# Patient Record
Sex: Female | Born: 1940
Health system: Southern US, Community
[De-identification: ages and names within clinical notes are randomized; demographics above are authoritative.]

## PROBLEM LIST (undated history)

## (undated) DIAGNOSIS — Z9889 Other specified postprocedural states: Secondary | ICD-10-CM

## (undated) DIAGNOSIS — K219 Gastro-esophageal reflux disease without esophagitis: Secondary | ICD-10-CM

## (undated) DIAGNOSIS — R011 Cardiac murmur, unspecified: Secondary | ICD-10-CM

## (undated) DIAGNOSIS — F329 Major depressive disorder, single episode, unspecified: Secondary | ICD-10-CM

## (undated) DIAGNOSIS — I639 Cerebral infarction, unspecified: Secondary | ICD-10-CM

## (undated) DIAGNOSIS — I7090 Unspecified atherosclerosis: Secondary | ICD-10-CM

## (undated) DIAGNOSIS — F32A Depression, unspecified: Secondary | ICD-10-CM

## (undated) DIAGNOSIS — R112 Nausea with vomiting, unspecified: Secondary | ICD-10-CM

## (undated) DIAGNOSIS — K5790 Diverticulosis of intestine, part unspecified, without perforation or abscess without bleeding: Secondary | ICD-10-CM

## (undated) DIAGNOSIS — E785 Hyperlipidemia, unspecified: Secondary | ICD-10-CM

## (undated) DIAGNOSIS — M1990 Unspecified osteoarthritis, unspecified site: Secondary | ICD-10-CM

## (undated) DIAGNOSIS — C801 Malignant (primary) neoplasm, unspecified: Secondary | ICD-10-CM

## (undated) DIAGNOSIS — M549 Dorsalgia, unspecified: Secondary | ICD-10-CM

## (undated) DIAGNOSIS — R42 Dizziness and giddiness: Secondary | ICD-10-CM

## (undated) HISTORY — PX: BREAST BIOPSY: SHX20

## (undated) HISTORY — DX: Hyperlipidemia, unspecified: E78.5

## (undated) HISTORY — PX: BREAST EXCISIONAL BIOPSY: SUR124

## (undated) HISTORY — DX: Unspecified atherosclerosis: I70.90

## (undated) HISTORY — PX: TONSILLECTOMY: SUR1361

---

## 1992-12-06 HISTORY — PX: FACELIFT: SHX1566

## 2005-01-06 ENCOUNTER — Ambulatory Visit: Payer: Self-pay | Admitting: Internal Medicine

## 2006-01-10 ENCOUNTER — Ambulatory Visit: Payer: Self-pay | Admitting: Internal Medicine

## 2007-01-17 ENCOUNTER — Ambulatory Visit: Payer: Self-pay | Admitting: Internal Medicine

## 2007-01-20 ENCOUNTER — Ambulatory Visit: Payer: Self-pay | Admitting: Internal Medicine

## 2007-02-14 ENCOUNTER — Ambulatory Visit: Payer: Self-pay | Admitting: General Surgery

## 2007-09-08 ENCOUNTER — Ambulatory Visit: Payer: Self-pay | Admitting: Internal Medicine

## 2007-12-07 HISTORY — PX: CERVICAL DISC SURGERY: SHX588

## 2008-04-18 ENCOUNTER — Ambulatory Visit: Payer: Self-pay | Admitting: Internal Medicine

## 2008-04-25 ENCOUNTER — Ambulatory Visit: Payer: Self-pay | Admitting: Physician Assistant

## 2008-06-04 ENCOUNTER — Encounter (INDEPENDENT_AMBULATORY_CARE_PROVIDER_SITE_OTHER): Payer: Self-pay | Admitting: Neurology

## 2008-06-04 ENCOUNTER — Ambulatory Visit: Payer: Self-pay

## 2008-07-02 ENCOUNTER — Ambulatory Visit: Payer: Self-pay

## 2008-10-15 ENCOUNTER — Encounter: Payer: Self-pay | Admitting: Neurosurgery

## 2008-11-19 ENCOUNTER — Ambulatory Visit: Payer: Self-pay | Admitting: Internal Medicine

## 2008-11-26 ENCOUNTER — Ambulatory Visit: Payer: Self-pay | Admitting: Internal Medicine

## 2008-12-10 ENCOUNTER — Ambulatory Visit: Payer: Self-pay | Admitting: Internal Medicine

## 2009-02-14 ENCOUNTER — Ambulatory Visit: Payer: Self-pay | Admitting: General Surgery

## 2009-05-28 ENCOUNTER — Ambulatory Visit: Payer: Self-pay | Admitting: Internal Medicine

## 2009-09-09 ENCOUNTER — Ambulatory Visit: Payer: Self-pay | Admitting: Internal Medicine

## 2009-12-29 ENCOUNTER — Ambulatory Visit: Payer: Self-pay | Admitting: Internal Medicine

## 2010-11-23 ENCOUNTER — Ambulatory Visit: Payer: Self-pay | Admitting: Internal Medicine

## 2010-11-23 ENCOUNTER — Other Ambulatory Visit: Payer: Self-pay | Admitting: Physician Assistant

## 2010-12-30 ENCOUNTER — Ambulatory Visit: Payer: Self-pay | Admitting: Internal Medicine

## 2010-12-31 ENCOUNTER — Ambulatory Visit: Payer: Self-pay | Admitting: Internal Medicine

## 2011-01-11 ENCOUNTER — Ambulatory Visit: Payer: Self-pay | Admitting: Surgery

## 2011-01-14 LAB — PATHOLOGY REPORT

## 2011-05-31 ENCOUNTER — Ambulatory Visit: Payer: Self-pay

## 2011-11-01 ENCOUNTER — Ambulatory Visit: Payer: Self-pay | Admitting: Unknown Physician Specialty

## 2011-11-01 DIAGNOSIS — Z01810 Encounter for preprocedural cardiovascular examination: Secondary | ICD-10-CM

## 2011-11-02 ENCOUNTER — Ambulatory Visit: Payer: Self-pay | Admitting: Unknown Physician Specialty

## 2011-12-07 HISTORY — PX: BACK SURGERY: SHX140

## 2012-03-01 ENCOUNTER — Ambulatory Visit: Payer: Self-pay | Admitting: Internal Medicine

## 2012-03-21 ENCOUNTER — Encounter: Payer: Self-pay | Admitting: Otolaryngology

## 2012-04-05 ENCOUNTER — Encounter: Payer: Self-pay | Admitting: Otolaryngology

## 2012-11-14 ENCOUNTER — Ambulatory Visit: Payer: Self-pay | Admitting: Internal Medicine

## 2013-04-17 ENCOUNTER — Ambulatory Visit: Payer: Self-pay | Admitting: Internal Medicine

## 2013-04-18 ENCOUNTER — Ambulatory Visit: Payer: Self-pay | Admitting: Internal Medicine

## 2013-05-03 ENCOUNTER — Ambulatory Visit: Payer: Self-pay | Admitting: Surgery

## 2013-05-04 LAB — PATHOLOGY REPORT

## 2013-07-09 ENCOUNTER — Encounter: Payer: Self-pay | Admitting: *Deleted

## 2013-07-31 ENCOUNTER — Ambulatory Visit: Payer: Self-pay | Admitting: General Surgery

## 2013-11-23 ENCOUNTER — Ambulatory Visit: Payer: Self-pay | Admitting: Surgery

## 2014-05-28 ENCOUNTER — Ambulatory Visit: Payer: Self-pay | Admitting: Surgery

## 2014-06-23 DIAGNOSIS — F419 Anxiety disorder, unspecified: Secondary | ICD-10-CM | POA: Insufficient documentation

## 2014-06-23 DIAGNOSIS — F325 Major depressive disorder, single episode, in full remission: Secondary | ICD-10-CM | POA: Insufficient documentation

## 2014-06-23 DIAGNOSIS — Z780 Asymptomatic menopausal state: Secondary | ICD-10-CM | POA: Insufficient documentation

## 2014-06-23 DIAGNOSIS — M8580 Other specified disorders of bone density and structure, unspecified site: Secondary | ICD-10-CM | POA: Insufficient documentation

## 2014-08-21 DIAGNOSIS — M51379 Other intervertebral disc degeneration, lumbosacral region without mention of lumbar back pain or lower extremity pain: Secondary | ICD-10-CM | POA: Insufficient documentation

## 2014-08-21 DIAGNOSIS — M5416 Radiculopathy, lumbar region: Secondary | ICD-10-CM | POA: Insufficient documentation

## 2014-08-21 DIAGNOSIS — M5137 Other intervertebral disc degeneration, lumbosacral region: Secondary | ICD-10-CM | POA: Insufficient documentation

## 2014-08-21 DIAGNOSIS — M25552 Pain in left hip: Secondary | ICD-10-CM | POA: Insufficient documentation

## 2014-09-16 ENCOUNTER — Ambulatory Visit: Payer: Self-pay | Admitting: Physical Medicine and Rehabilitation

## 2014-10-29 ENCOUNTER — Other Ambulatory Visit (HOSPITAL_COMMUNITY): Payer: Self-pay | Admitting: Neurosurgery

## 2014-11-13 ENCOUNTER — Encounter (HOSPITAL_COMMUNITY)
Admission: RE | Admit: 2014-11-13 | Discharge: 2014-11-13 | Disposition: A | Discharge status: Home / Self Care | Payer: Medicare Other | Source: Ambulatory Visit | Attending: Neurosurgery | Admitting: Neurosurgery

## 2014-11-13 ENCOUNTER — Encounter (HOSPITAL_COMMUNITY): Payer: Self-pay

## 2014-11-13 HISTORY — DX: Other specified postprocedural states: R11.2

## 2014-11-13 HISTORY — DX: Depression, unspecified: F32.A

## 2014-11-13 HISTORY — DX: Major depressive disorder, single episode, unspecified: F32.9

## 2014-11-13 HISTORY — DX: Malignant (primary) neoplasm, unspecified: C80.1

## 2014-11-13 HISTORY — DX: Cardiac murmur, unspecified: R01.1

## 2014-11-13 HISTORY — DX: Gastro-esophageal reflux disease without esophagitis: K21.9

## 2014-11-13 HISTORY — DX: Unspecified osteoarthritis, unspecified site: M19.90

## 2014-11-13 HISTORY — DX: Other specified postprocedural states: Z98.890

## 2014-11-13 HISTORY — DX: Dizziness and giddiness: R42

## 2014-11-13 HISTORY — DX: Diverticulosis of intestine, part unspecified, without perforation or abscess without bleeding: K57.90

## 2014-11-13 LAB — BASIC METABOLIC PANEL
Anion gap: 11 (ref 5–15)
BUN: 10 mg/dL (ref 6–23)
CO2: 27 mEq/L (ref 19–32)
Calcium: 9.1 mg/dL (ref 8.4–10.5)
Chloride: 100 mEq/L (ref 96–112)
Creatinine, Ser: 0.83 mg/dL (ref 0.50–1.10)
GFR calc Af Amer: 79 mL/min — ABNORMAL LOW (ref >90)
GFR calc non Af Amer: 68 mL/min — ABNORMAL LOW (ref >90)
Glucose, Bld: 84 mg/dL (ref 70–99)
Potassium: 4.3 mEq/L (ref 3.7–5.3)
Sodium: 138 mEq/L (ref 137–147)

## 2014-11-13 LAB — CBC
HCT: 33.6 % — ABNORMAL LOW (ref 36.0–46.0)
Hemoglobin: 10.8 g/dL — ABNORMAL LOW (ref 12.0–15.0)
MCH: 28 pg (ref 26.0–34.0)
MCHC: 32.1 g/dL (ref 30.0–36.0)
MCV: 87 fL (ref 78.0–100.0)
Platelets: 257 10*3/uL (ref 150–400)
RBC: 3.86 MIL/uL — ABNORMAL LOW (ref 3.87–5.11)
RDW: 14.4 % (ref 11.5–15.5)
WBC: 6.3 10*3/uL (ref 4.0–10.5)

## 2014-11-13 LAB — SURGICAL PCR SCREEN
MRSA, PCR: NEGATIVE
Staphylococcus aureus: NEGATIVE

## 2014-11-13 LAB — ABO/RH: ABO/RH(D): O POS

## 2014-11-13 NOTE — Pre-Procedure Instructions (Addendum)
Melody Ochoa  11/13/2014   Your procedure is scheduled on:  11/18/14  Report to [facility] short stay admitting at 41 AM.  Call this number if you have problems the morning of surgery: [phone]   Remember:   Do not eat food or drink liquids after midnight.   Take these medicines the morning of surgery with A SIP OF WATER: wellbutrin,cymbalta,gabapentin, pain med if needed,protonix  Take all meds as ordered until day of surgery except as instructed below or per dr  Bridgette Habermann all herbel meds, nsaids (aleve,naproxen,advil,ibuprofen) starting today including aspirin,vitamins,folic acid    Do not wear jewelry, make-up or nail polish.  Do not wear lotions, powders, or perfumes. You may wear deodorant.  Do not shave 48 hours prior to surgery. Men may shave face and neck.  Do not bring valuables to the hospital.  [facility] is not responsible                  for any belongings or valuables.               Contacts, dentures or bridgework may not be worn into surgery.  Leave suitcase in the car. After surgery it may be brought to your room.  For patients admitted to the hospital, discharge time is determined by your                treatment team.               Patients discharged the day of surgery will not be allowed to drive  home.  Name and phone number of your driver:   Special Instructions:  Special Instructions: [place] - Preparing for Surgery  Before surgery, you can play an important role.  Because skin is not sterile, your skin needs to be as free of germs as possible.  You can reduce the number of germs on you skin by washing with CHG (chlorahexidine gluconate) soap before surgery.  CHG is an antiseptic cleaner which kills germs and bonds with the skin to continue killing germs even after washing.  Please DO NOT use if you have an allergy to CHG or antibacterial soaps.  If your skin becomes reddened/irritated stop using the CHG and inform your nurse when you arrive at Short  Stay.  Do not shave (including legs and underarms) for at least 48 hours prior to the first CHG shower.  You may shave your face.  Please follow these instructions carefully:   1.  Shower with CHG Soap the night before surgery and the morning of Surgery.  2.  If you choose to wash your hair, wash your hair first as usual with your normal shampoo.  3.  After you shampoo, rinse your hair and body thoroughly to remove the Shampoo.  4.  Use CHG as you would any other liquid soap.  You can apply chg directly  to the skin and wash gently with scrungie or a clean washcloth.  5.  Apply the CHG Soap to your body ONLY FROM THE NECK DOWN.  Do not use on open wounds or open sores.  Avoid contact with your eyes ears, mouth and genitals (private parts).  Wash genitals (private parts)       with your normal soap.  6.  Wash thoroughly, paying special attention to the area where your surgery will be performed.  7.  Thoroughly rinse your body with warm water from the neck down.  8.  DO NOT shower/wash with  your normal soap after using and rinsing off the CHG Soap.  9.  Pat yourself dry with a clean towel.            10.  Wear clean pajamas.            11.  Place clean sheets on your bed the night of your first shower and do not sleep with pets.  Day of Surgery  Do not apply any lotions/deodorants the morning of surgery.  Please wear clean clothes to the hospital/surgery center.   Please read over the following fact sheets that you were given: Pain Booklet, Coughing and Deep Breathing, Blood Transfusion Information, MRSA Information and Surgical Site Infection Prevention

## 2014-11-17 MED ORDER — CEFAZOLIN SODIUM-DEXTROSE 2-3 GM-% IV SOLR
2.0000 g | INTRAVENOUS | Status: AC
  Administered 2014-11-18 (×2): 2 g via INTRAVENOUS
  Filled 2014-11-17: qty 50

## 2014-11-18 ENCOUNTER — Encounter (HOSPITAL_COMMUNITY)
Admission: RE | Disposition: A | Discharge status: Home / Self Care | Payer: Self-pay | Source: Ambulatory Visit | Attending: Neurosurgery

## 2014-11-18 ENCOUNTER — Inpatient Hospital Stay (HOSPITAL_COMMUNITY): Payer: Medicare Other | Admitting: Anesthesiology

## 2014-11-18 ENCOUNTER — Encounter (HOSPITAL_COMMUNITY): Payer: Self-pay | Admitting: *Deleted

## 2014-11-18 ENCOUNTER — Inpatient Hospital Stay (HOSPITAL_COMMUNITY): Payer: Medicare Other

## 2014-11-18 ENCOUNTER — Inpatient Hospital Stay (HOSPITAL_COMMUNITY)
Admission: RE | Admit: 2014-11-18 | Discharge: 2014-11-20 | DRG: 460 | Disposition: A | Discharge status: Home / Self Care | Payer: Medicare Other | Source: Ambulatory Visit | Attending: Neurosurgery | Admitting: Neurosurgery

## 2014-11-18 DIAGNOSIS — I739 Peripheral vascular disease, unspecified: Secondary | ICD-10-CM | POA: Diagnosis present

## 2014-11-18 DIAGNOSIS — M4806 Spinal stenosis, lumbar region: Secondary | ICD-10-CM | POA: Diagnosis present

## 2014-11-18 DIAGNOSIS — Z87891 Personal history of nicotine dependence: Secondary | ICD-10-CM | POA: Diagnosis not present

## 2014-11-18 DIAGNOSIS — M5116 Intervertebral disc disorders with radiculopathy, lumbar region: Secondary | ICD-10-CM | POA: Diagnosis present

## 2014-11-18 DIAGNOSIS — G9619 Other disorders of meninges, not elsewhere classified: Secondary | ICD-10-CM | POA: Diagnosis present

## 2014-11-18 DIAGNOSIS — K219 Gastro-esophageal reflux disease without esophagitis: Secondary | ICD-10-CM | POA: Diagnosis present

## 2014-11-18 DIAGNOSIS — M4310 Spondylolisthesis, site unspecified: Secondary | ICD-10-CM

## 2014-11-18 DIAGNOSIS — M129 Arthropathy, unspecified: Secondary | ICD-10-CM | POA: Diagnosis present

## 2014-11-18 DIAGNOSIS — Z91048 Other nonmedicinal substance allergy status: Secondary | ICD-10-CM

## 2014-11-18 DIAGNOSIS — M1990 Unspecified osteoarthritis, unspecified site: Secondary | ICD-10-CM | POA: Diagnosis present

## 2014-11-18 DIAGNOSIS — M4316 Spondylolisthesis, lumbar region: Secondary | ICD-10-CM | POA: Diagnosis present

## 2014-11-18 DIAGNOSIS — R509 Fever, unspecified: Secondary | ICD-10-CM

## 2014-11-18 DIAGNOSIS — F329 Major depressive disorder, single episode, unspecified: Secondary | ICD-10-CM | POA: Diagnosis present

## 2014-11-18 DIAGNOSIS — Z79899 Other long term (current) drug therapy: Secondary | ICD-10-CM

## 2014-11-18 DIAGNOSIS — M545 Low back pain: Secondary | ICD-10-CM | POA: Diagnosis present

## 2014-11-18 SURGERY — POSTERIOR LUMBAR FUSION 1 LEVEL
Anesthesia: General

## 2014-11-18 MED ORDER — ONDANSETRON HCL 4 MG/2ML IJ SOLN: 4.0000 mg | Freq: Once | INTRAMUSCULAR | Status: DC | PRN

## 2014-11-18 MED ORDER — THROMBIN 20000 UNITS EX SOLR
CUTANEOUS | Status: DC | PRN
  Administered 2014-11-18: 14:00:00 via TOPICAL

## 2014-11-18 MED ORDER — HYDROMORPHONE HCL 1 MG/ML IJ SOLN
0.2500 mg | INTRAMUSCULAR | Status: DC | PRN
  Administered 2014-11-18 (×4): 0.5 mg via INTRAVENOUS

## 2014-11-18 MED ORDER — HYDROCODONE-ACETAMINOPHEN 5-325 MG PO TABS
1.0000 | ORAL_TABLET | ORAL | Status: DC | PRN
  Administered 2014-11-18 – 2014-11-20 (×6): 2 via ORAL
  Filled 2014-11-18 (×3): qty 2
  Filled 2014-11-18 (×2): qty 1
  Filled 2014-11-18 (×2): qty 2

## 2014-11-18 MED ORDER — HYDROMORPHONE HCL 1 MG/ML IJ SOLN
INTRAMUSCULAR | Status: AC
  Administered 2014-11-18: 0.5 mg via INTRAVENOUS
  Filled 2014-11-18: qty 1

## 2014-11-18 MED ORDER — PANTOPRAZOLE SODIUM 40 MG PO TBEC
40.0000 mg | DELAYED_RELEASE_TABLET | Freq: Every day | ORAL | Status: DC
  Administered 2014-11-18 – 2014-11-19 (×2): 40 mg via ORAL
  Filled 2014-11-18 (×2): qty 1

## 2014-11-18 MED ORDER — ACETAMINOPHEN 650 MG RE SUPP: 650.0000 mg | RECTAL | Status: DC | PRN

## 2014-11-18 MED ORDER — LACTATED RINGERS IV SOLN: INTRAVENOUS | Status: DC

## 2014-11-18 MED ORDER — ROCURONIUM BROMIDE 100 MG/10ML IV SOLN
INTRAVENOUS | Status: DC | PRN
  Administered 2014-11-18: 50 mg via INTRAVENOUS
  Administered 2014-11-18 (×2): 10 mg via INTRAVENOUS

## 2014-11-18 MED ORDER — BACITRACIN ZINC 500 UNIT/GM EX OINT
TOPICAL_OINTMENT | CUTANEOUS | Status: DC | PRN
  Administered 2014-11-18: 1 via TOPICAL

## 2014-11-18 MED ORDER — OXYCODONE-ACETAMINOPHEN 5-325 MG PO TABS
1.0000 | ORAL_TABLET | ORAL | Status: DC | PRN
  Administered 2014-11-19 (×2): 2 via ORAL
  Filled 2014-11-18 (×2): qty 2

## 2014-11-18 MED ORDER — GABAPENTIN 300 MG PO CAPS: 300.0000 mg | ORAL_CAPSULE | Freq: Two times a day (BID) | ORAL | Status: DC

## 2014-11-18 MED ORDER — GABAPENTIN 300 MG PO CAPS
300.0000 mg | ORAL_CAPSULE | Freq: Every day | ORAL | Status: DC
  Administered 2014-11-19: 300 mg via ORAL
  Filled 2014-11-18 (×2): qty 1

## 2014-11-18 MED ORDER — GLYCOPYRROLATE 0.2 MG/ML IJ SOLN
INTRAMUSCULAR | Status: AC
  Filled 2014-11-18: qty 5

## 2014-11-18 MED ORDER — PHENYLEPHRINE HCL 10 MG/ML IJ SOLN
INTRAMUSCULAR | Status: DC | PRN
  Administered 2014-11-18: 40 ug via INTRAVENOUS
  Administered 2014-11-18 (×2): 80 ug via INTRAVENOUS

## 2014-11-18 MED ORDER — LIDOCAINE HCL (CARDIAC) 20 MG/ML IV SOLN
INTRAVENOUS | Status: DC | PRN
  Administered 2014-11-18: 80 mg via INTRAVENOUS

## 2014-11-18 MED ORDER — GABAPENTIN 600 MG PO TABS
600.0000 mg | ORAL_TABLET | Freq: Every day | ORAL | Status: DC
  Administered 2014-11-18 – 2014-11-19 (×2): 600 mg via ORAL
  Filled 2014-11-18 (×3): qty 1

## 2014-11-18 MED ORDER — BUPROPION HCL ER (SR) 100 MG PO TB12
200.0000 mg | ORAL_TABLET | Freq: Two times a day (BID) | ORAL | Status: DC
  Administered 2014-11-18 – 2014-11-19 (×3): 200 mg via ORAL
  Filled 2014-11-18 (×5): qty 2

## 2014-11-18 MED ORDER — BUPIVACAINE LIPOSOME 1.3 % IJ SUSP
20.0000 mL | INTRAMUSCULAR | Status: DC
  Filled 2014-11-18: qty 20

## 2014-11-18 MED ORDER — ONDANSETRON HCL 4 MG/2ML IJ SOLN
4.0000 mg | INTRAMUSCULAR | Status: DC | PRN
  Administered 2014-11-19: 4 mg via INTRAVENOUS
  Filled 2014-11-18: qty 2

## 2014-11-18 MED ORDER — LACTATED RINGERS IV SOLN
INTRAVENOUS | Status: DC | PRN
  Administered 2014-11-18 (×3): via INTRAVENOUS

## 2014-11-18 MED ORDER — FENTANYL CITRATE 0.05 MG/ML IJ SOLN
INTRAMUSCULAR | Status: AC
  Filled 2014-11-18: qty 5

## 2014-11-18 MED ORDER — SODIUM CHLORIDE 0.9 % IR SOLN
Status: DC | PRN
  Administered 2014-11-18: 14:00:00

## 2014-11-18 MED ORDER — MORPHINE SULFATE 2 MG/ML IJ SOLN: 1.0000 mg | INTRAMUSCULAR | Status: DC | PRN

## 2014-11-18 MED ORDER — NEOSTIGMINE METHYLSULFATE 10 MG/10ML IV SOLN
INTRAVENOUS | Status: DC | PRN
Start: 2014-11-18 — End: 2014-11-18
  Administered 2014-11-18: 5 mg via INTRAVENOUS

## 2014-11-18 MED ORDER — KETOROLAC TROMETHAMINE 0.5 % OP SOLN
1.0000 [drp] | Freq: Four times a day (QID) | OPHTHALMIC | Status: DC
  Administered 2014-11-18: 1 [drp] via OPHTHALMIC
  Filled 2014-11-18: qty 3

## 2014-11-18 MED ORDER — CEFAZOLIN SODIUM-DEXTROSE 2-3 GM-% IV SOLR
2.0000 g | Freq: Three times a day (TID) | INTRAVENOUS | Status: AC
  Administered 2014-11-18 – 2014-11-19 (×2): 2 g via INTRAVENOUS
  Filled 2014-11-18 (×2): qty 50

## 2014-11-18 MED ORDER — LACTATED RINGERS IV SOLN
INTRAVENOUS | Status: DC
  Administered 2014-11-18: 10:00:00 via INTRAVENOUS

## 2014-11-18 MED ORDER — DOCUSATE SODIUM 100 MG PO CAPS
100.0000 mg | ORAL_CAPSULE | Freq: Two times a day (BID) | ORAL | Status: DC
  Administered 2014-11-18 – 2014-11-19 (×3): 100 mg via ORAL
  Filled 2014-11-18 (×5): qty 1

## 2014-11-18 MED ORDER — ALBUMIN HUMAN 5 % IV SOLN
INTRAVENOUS | Status: DC | PRN
  Administered 2014-11-18: 15:00:00 via INTRAVENOUS

## 2014-11-18 MED ORDER — ONDANSETRON HCL 4 MG/2ML IJ SOLN
INTRAMUSCULAR | Status: DC | PRN
  Administered 2014-11-18: 4 mg via INTRAVENOUS

## 2014-11-18 MED ORDER — DIAZEPAM 5 MG PO TABS
ORAL_TABLET | ORAL | Status: AC
  Filled 2014-11-18: qty 1

## 2014-11-18 MED ORDER — CEFAZOLIN SODIUM-DEXTROSE 2-3 GM-% IV SOLR: 2.0000 g | Freq: Three times a day (TID) | INTRAVENOUS | Status: AC

## 2014-11-18 MED ORDER — ALUM & MAG HYDROXIDE-SIMETH 200-200-20 MG/5ML PO SUSP: 30.0000 mL | Freq: Four times a day (QID) | ORAL | Status: DC | PRN

## 2014-11-18 MED ORDER — ACETAMINOPHEN 325 MG PO TABS
650.0000 mg | ORAL_TABLET | ORAL | Status: DC | PRN
  Administered 2014-11-19: 650 mg via ORAL
  Filled 2014-11-18: qty 2

## 2014-11-18 MED ORDER — BUPIVACAINE LIPOSOME 1.3 % IJ SUSP
INTRAMUSCULAR | Status: DC | PRN
  Administered 2014-11-18: 20 mL

## 2014-11-18 MED ORDER — GLYCOPYRROLATE 0.2 MG/ML IJ SOLN
INTRAMUSCULAR | Status: DC | PRN
  Administered 2014-11-18: .9 mg via INTRAVENOUS

## 2014-11-18 MED ORDER — PHENOL 1.4 % MT LIQD: 1.0000 | OROMUCOSAL | Status: DC | PRN

## 2014-11-18 MED ORDER — PROPOFOL 10 MG/ML IV BOLUS
INTRAVENOUS | Status: DC | PRN
  Administered 2014-11-18: 180 mg via INTRAVENOUS

## 2014-11-18 MED ORDER — FENTANYL CITRATE 0.05 MG/ML IJ SOLN
INTRAMUSCULAR | Status: DC | PRN
  Administered 2014-11-18: 50 ug via INTRAVENOUS
  Administered 2014-11-18: 75 ug via INTRAVENOUS
  Administered 2014-11-18: 50 ug via INTRAVENOUS
  Administered 2014-11-18: 25 ug via INTRAVENOUS
  Administered 2014-11-18: 50 ug via INTRAVENOUS

## 2014-11-18 MED ORDER — MENTHOL 3 MG MT LOZG: 1.0000 | LOZENGE | OROMUCOSAL | Status: DC | PRN

## 2014-11-18 MED ORDER — DIAZEPAM 5 MG PO TABS
5.0000 mg | ORAL_TABLET | Freq: Four times a day (QID) | ORAL | Status: DC | PRN
  Administered 2014-11-18 – 2014-11-20 (×4): 5 mg via ORAL
  Filled 2014-11-18 (×3): qty 1

## 2014-11-18 MED ORDER — DULOXETINE HCL 60 MG PO CPEP
60.0000 mg | ORAL_CAPSULE | Freq: Every day | ORAL | Status: DC
  Administered 2014-11-19: 60 mg via ORAL
  Filled 2014-11-18 (×2): qty 1

## 2014-11-18 MED ORDER — 0.9 % SODIUM CHLORIDE (POUR BTL) OPTIME
TOPICAL | Status: DC | PRN
  Administered 2014-11-18: 1000 mL

## 2014-11-18 SURGICAL SUPPLY — 60 items
BAG DECANTER FOR FLEXI CONT (MISCELLANEOUS) ×2 IMPLANT
BENZOIN TINCTURE PRP APPL 2/3 (GAUZE/BANDAGES/DRESSINGS) ×2 IMPLANT
BRUSH SCRUB EZ PLAIN DRY (MISCELLANEOUS) ×2 IMPLANT
BUR MATCHSTICK NEURO 3.0 LAGG (BURR) ×2 IMPLANT
BUR PRECISION FLUTE 6.0 (BURR) ×2 IMPLANT
CANISTER SUCT 3000ML (MISCELLANEOUS) ×2 IMPLANT
CAP REVERE LOCKING (Cap) ×8 IMPLANT
CONT SPEC 4OZ CLIKSEAL STRL BL (MISCELLANEOUS) ×2 IMPLANT
COVER BACK TABLE 60X90IN (DRAPES) ×2 IMPLANT
DRAPE C-ARM 42X72 X-RAY (DRAPES) ×4 IMPLANT
DRAPE LAPAROTOMY 100X72X124 (DRAPES) ×2 IMPLANT
DRAPE POUCH INSTRU U-SHP 10X18 (DRAPES) ×2 IMPLANT
DRAPE PROXIMA HALF (DRAPES) ×2 IMPLANT
DRAPE SURG 17X23 STRL (DRAPES) ×8 IMPLANT
ELECT BLADE 4.0 EZ CLEAN MEGAD (MISCELLANEOUS) ×2
ELECT REM PT RETURN 9FT ADLT (ELECTROSURGICAL) ×2
ELECTRODE BLDE 4.0 EZ CLN MEGD (MISCELLANEOUS) ×1 IMPLANT
ELECTRODE REM PT RTRN 9FT ADLT (ELECTROSURGICAL) ×1 IMPLANT
EVACUATOR 1/8 PVC DRAIN (DRAIN) IMPLANT
GAUZE SPONGE 4X4 12PLY STRL (GAUZE/BANDAGES/DRESSINGS) ×2 IMPLANT
GAUZE SPONGE 4X4 16PLY XRAY LF (GAUZE/BANDAGES/DRESSINGS) ×2 IMPLANT
GLOVE BIO SURGEON STRL SZ8.5 (GLOVE) ×4 IMPLANT
GLOVE ECLIPSE 9.0 STRL (GLOVE) ×2 IMPLANT
GLOVE EXAM NITRILE LRG STRL (GLOVE) IMPLANT
GLOVE EXAM NITRILE MD LF STRL (GLOVE) IMPLANT
GLOVE EXAM NITRILE XL STR (GLOVE) IMPLANT
GLOVE EXAM NITRILE XS STR PU (GLOVE) IMPLANT
GLOVE SS BIOGEL STRL SZ 8 (GLOVE) ×3 IMPLANT
GLOVE SUPERSENSE BIOGEL SZ 8 (GLOVE) ×3
GOWN STRL REUS W/ TWL LRG LVL3 (GOWN DISPOSABLE) ×1 IMPLANT
GOWN STRL REUS W/ TWL XL LVL3 (GOWN DISPOSABLE) ×2 IMPLANT
GOWN STRL REUS W/TWL 2XL LVL3 (GOWN DISPOSABLE) IMPLANT
GOWN STRL REUS W/TWL LRG LVL3 (GOWN DISPOSABLE) ×1
GOWN STRL REUS W/TWL XL LVL3 (GOWN DISPOSABLE) ×2
KIT BASIN OR (CUSTOM PROCEDURE TRAY) ×2 IMPLANT
KIT ROOM TURNOVER OR (KITS) ×2 IMPLANT
NEEDLE HYPO 21X1.5 SAFETY (NEEDLE) ×2 IMPLANT
NEEDLE HYPO 22GX1.5 SAFETY (NEEDLE) ×2 IMPLANT
NS IRRIG 1000ML POUR BTL (IV SOLUTION) ×2 IMPLANT
PACK LAMINECTOMY NEURO (CUSTOM PROCEDURE TRAY) ×2 IMPLANT
PAD ARMBOARD 7.5X6 YLW CONV (MISCELLANEOUS) ×6 IMPLANT
PATTIES SURGICAL .5 X1 (DISPOSABLE) IMPLANT
ROD REVERE CURVED 6.35X35MM (Rod) ×4 IMPLANT
SCREW 7.5X50MM (Screw) ×8 IMPLANT
SPACER ALTERA 10X31-15 (Spacer) ×2 IMPLANT
SPONGE LAP 4X18 X RAY DECT (DISPOSABLE) IMPLANT
SPONGE NEURO XRAY DETECT 1X3 (DISPOSABLE) IMPLANT
SPONGE SURGIFOAM ABS GEL 100 (HEMOSTASIS) ×2 IMPLANT
STRIP BIOACTIVE 20CC 25X100X8 (Miscellaneous) ×2 IMPLANT
STRIP CLOSURE SKIN 1/2X4 (GAUZE/BANDAGES/DRESSINGS) ×2 IMPLANT
SUT VIC AB 1 CT1 18XBRD ANBCTR (SUTURE) ×2 IMPLANT
SUT VIC AB 1 CT1 8-18 (SUTURE) ×2
SUT VIC AB 2-0 CP2 18 (SUTURE) ×4 IMPLANT
SYR 20CC LL (SYRINGE) ×2 IMPLANT
SYR 20ML ECCENTRIC (SYRINGE) ×2 IMPLANT
TAPE CLOTH SURG 4X10 WHT LF (GAUZE/BANDAGES/DRESSINGS) ×2 IMPLANT
TOWEL OR 17X24 6PK STRL BLUE (TOWEL DISPOSABLE) ×2 IMPLANT
TOWEL OR 17X26 10 PK STRL BLUE (TOWEL DISPOSABLE) ×2 IMPLANT
TRAY FOLEY CATH 14FRSI W/METER (CATHETERS) ×2 IMPLANT
WATER STERILE IRR 1000ML POUR (IV SOLUTION) ×2 IMPLANT

## 2014-11-18 NOTE — Progress Notes (Signed)
Anesthesiology Post-op:  73 year old female underwent L4-5 posterior decompression in prone position. Now in PACU complaining of L. Eye discomfort and redness, "feels like a grain of sand in the eye"  Exam: mild conjunctival redness, vision grossly normal.  Impression: Probable intra-operative corneal abrasion.   Plan:  1.Eye patch as needed for discomfort 2. Ketoralac eye drops quid 3. Follow-up in AM consider ophthalmology consult if symptoms persist beyond 24-48 hours.  Roberts Gaudy, MD

## 2014-11-18 NOTE — H&P (Signed)
Subjective: The patient is a 73 year old white female who has complained of back and leg pain consistent with neurogenic claudication. She has failed medical management and was worked up with a more MRI and lumbar x-rays the studies demonstrated a grade 1 spondylolisthesis with spinal stenosis at L4-5. I discussed the various treatment options with the patient including surgery. She has weighed the risks, benefits, and alternative surgery and decided proceed with a L4-5 decompression, instrumentation, effusion   Past Medical History  Diagnosis Date  . PONV (postoperative nausea and vomiting)     yrs ago  . Heart murmur   . Depression   . GERD (gastroesophageal reflux disease)   . Arthritis   . Cancer     skin  . Diverticulosis   . Dizziness     patient had episode of dizziness when came in room. hx past no dx    Past Surgical History  Procedure Laterality Date  . Tonsillectomy    . Facelift  94  . Cervical disc surgery  09  . Back surgery  13    Allergies  Allergen Reactions  . Other Rash    bandaides    History  Substance Use Topics  . Smoking status: Former Smoker -- 1.00 packs/day for 40 years    Types: Cigarettes    Quit date: 11/13/1996  . Smokeless tobacco: Not on file  . Alcohol Use: Yes     Comment: occ wine    History reviewed. No pertinent family history. Prior to Admission medications   Medication Sig Start Date End Date Taking? Authorizing Provider  buPROPion (WELLBUTRIN SR) 200 MG 12 hr tablet Take 200 mg by mouth 2 (two) times daily. 10/06/14  Yes Historical Provider, MD  Calcium Carbonate-Vitamin D (CALTRATE 600+D PO) Take 1 tablet by mouth daily.   Yes Historical Provider, MD  DULoxetine (CYMBALTA) 60 MG capsule Take 60 mg by mouth daily.   Yes Historical Provider, MD  FOLIC ACID PO Take 15 mg by mouth daily.   Yes Historical Provider, MD  gabapentin (NEURONTIN) 300 MG capsule Take 300-600 mg by mouth 2 (two) times daily. 300 mg in am am and 600 mg in pm  09/01/14  Yes Historical Provider, MD  HYDROcodone-acetaminophen (NORCO/VICODIN) 5-325 MG per tablet Take 1 tablet by mouth every 4 (four) hours as needed for moderate pain or severe pain.  10/14/14  Yes Historical Provider, MD  pantoprazole (PROTONIX) 40 MG tablet Take 40 mg by mouth daily. 10/16/14  Yes Historical Provider, MD     Review of Systems  Positive ROS: As above  All other systems have been reviewed and were otherwise negative with the exception of those mentioned in the HPI and as above.  Objective: Vital signs in last 24 hours: Temp:  [97.8 F (36.6 C)] 97.8 F (36.6 C) (12/14 0944) Pulse Rate:  [81] 81 (12/14 0944) Resp:  [20] 20 (12/14 0944) BP: (151)/(60) 151/60 mmHg (12/14 0944) SpO2:  [100 %] 100 % (12/14 0944)  General Appearance: Alert, cooperative, no distress, Head: Normocephalic, without obvious abnormality, atraumatic Eyes: PERRL, conjunctiva/corneas clear, EOM's intact,    Ears: Normal  Throat: Normal  Neck: Supple, symmetrical, trachea midline, no adenopathy; thyroid: No enlargement/tenderness/nodules; no carotid bruit or JVD Back: Symmetric, no curvature, ROM normal, no CVA tenderness Lungs: Clear to auscultation bilaterally, respirations unlabored Heart: Regular rate and rhythm, no murmur, rub or gallop Abdomen: Soft, non-tender,, no masses, no organomegaly Extremities: Extremities normal, atraumatic, no cyanosis or edema Pulses: 2+ and symmetric  all extremities Skin: Skin color, texture, turgor normal, no rashes or lesions  NEUROLOGIC:   Mental status: alert and oriented, no aphasia, good attention span, Fund of knowledge/ memory ok Motor Exam - grossly normal Sensory Exam - grossly normal Reflexes:  Coordination - grossly normal Gait - grossly normal Balance - grossly normal Cranial Nerves: I: smell Not tested  II: visual acuity  OS: Normal  OD: Normal   II: visual fields Full to confrontation  II: pupils Equal, round, reactive to light   III,VII: ptosis None  III,IV,VI: extraocular muscles  Full ROM  V: mastication Normal  V: facial light touch sensation  Normal  V,VII: corneal reflex  Present  VII: facial muscle function - upper  Normal  VII: facial muscle function - lower Normal  VIII: hearing Not tested  IX: soft palate elevation  Normal  IX,X: gag reflex Present  XI: trapezius strength  5/5  XI: sternocleidomastoid strength 5/5  XI: neck flexion strength  5/5  XII: tongue strength  Normal    Data Review Lab Results  Component Value Date   WBC 6.3 11/13/2014   HGB 10.8* 11/13/2014   HCT 33.6* 11/13/2014   MCV 87.0 11/13/2014   PLT 257 11/13/2014   Lab Results  Component Value Date   NA 138 11/13/2014   K 4.3 11/13/2014   CL 100 11/13/2014   CO2 27 11/13/2014   BUN 10 11/13/2014   CREATININE 0.83 11/13/2014   GLUCOSE 84 11/13/2014   No results found for: INR, PROTIME  Assessment/Plan: L4-5 spondylolisthesis, spinal stenosis, lumbago, lumbar radiculopathy, neurogenic claudication: I have discussed the situation with the patient. I have reviewed her imaging studies with her and pointed out the abnormalities. We have discussed the various treatment options including surgery. I have described the surgical treatment option L4-5 decompression, his rotation, and fusion. I have shown her surgical models. We have discussed the risks, benefits, alternatives, and likelihood of achieving our goals with surgery. I have answered all her questions. She has decided to proceed with surgery.   Malashia Kamaka D 11/18/2014 11:59 AM

## 2014-11-18 NOTE — Op Note (Signed)
Brief history: The patient is a 73 year old white female who has had prior back surgery. She is developing recurrent back, buttock and leg pain consistent with neurogenic claudication. She has failed medical management and was worked up with a lumbar MRI and lumbar x-rays. This demonstrated spinal stenosis at L3-4 and L4-5 with a spondylolisthesis at L4-5. I discussed the various treatment options with the patient including surgery. She has weighed the risks, benefits, and alternative surgery and decided proceed with a lumbar decompression, instrumentation, and fusion.  Preoperative diagnosis: L3-4 and L4-5 Degenerative disc disease, spinal stenosis compressing L3, L4 and L5 nerve roots; lumbago; lumbar radiculopathy; L4-5 spondylolisthesis  Postoperative diagnosis: The same  Procedure: Redo L4 laminectomy with bilateral L3 Laminotomies/foraminotomies to decompress the bilateral L3, L4 and L5 nerve roots(the work required to do this was in addition to the work required to do the posterior lumbar interbody fusion because of the patient's spinal stenosis, facet arthropathy. Etc. requiring a wide decompression of the nerve roots.); L4-5 posterior lumbar interbody fusion with local morselized autograft bone and Kinnex graft extender; insertion of interbody prosthesis at L4-5 (globus peek expandable interbody prosthesis); posterior nonsegmental instrumentation from L4 to L5 with globus titanium pedicle screws and rods; posterior lateral arthrodesis at L4-5 with local morselized autograft bone and Kinnex bone graft extender.  Surgeon: Dr. Earle Gell  Asst.: Dr. Granville Lewis  Anesthesia: Gen. endotracheal  Estimated blood loss: 250 mL  Drains: One medium Hemovac  Complications: None  Description of procedure: The patient was brought to the operating room by the anesthesia team. General endotracheal anesthesia was induced. The patient was turned to the prone position on the Wilson frame. The patient's  lumbosacral region was then prepared with Betadine scrub and Betadine solution. Sterile drapes were applied.  I then injected the area to be incised with Marcaine with epinephrine solution. I then used the scalpel to make a linear midline incision over the L3-4 and L4-5 interspace. I then used electrocautery to perform a bilateral subperiosteal dissection, dissecting through the old scar tissue, exposing the spinous process and lamina of L3, L4 and L5. We then obtained intraoperative radiograph to confirm our location. We then inserted the Verstrac retractor to provide exposure.  I began the decompression by using the high speed drill to perform laminotomies at L3 and L4 bilaterally. We then used the Kerrison punches to widen the laminotomy and removed the ligamentum flavum at L3-4 and L4-5 as well as to remove the epidural fibrosis at L4-5 from the prior operation. We used the Kerrison punches to remove the medial facets at L3-4 and L4-5. We performed wide foraminotomies about the bilateral L3, L4 and L5 nerve roots completing the decompression.  We now turned our attention to the posterior lumbar interbody fusion. I used a scalpel to incise the intervertebral disc at L4-5. I then performed a partial intervertebral discectomy at L4-5 using the pituitary forceps. We prepared the vertebral endplates at A1-9 for the fusion by removing the soft tissues with the curettes. We then used the trial spacers to pick the appropriate sized interbody prosthesis. We prefilled his prosthesis with a combination of local morselized autograft bone that we obtained during the decompression as well as Kinnex bone graft extender. We inserted the prefilled prosthesis into the interspace at L4-5. We expanded the prosthesis.. There was a good snug fit of the prosthesis in the interspace. We then filled and the remainder of the intervertebral disc space with local morselized autograft bone and Kinnex. This completed the  posterior  lumbar interbody arthrodesis.  We now turned attention to the instrumentation. Under fluoroscopic guidance we cannulated the bilateral L4 and L5 pedicles with the bone probe. We then removed the bone probe. We then tapped the pedicle with a 6.5 millimeter tap. We then removed the tap. We probed inside the tapped pedicle with a ball probe to rule out cortical breaches. We then inserted a 7.5 x 50 millimeter pedicle screw into the L4 and L5 pedicles bilaterally under fluoroscopic guidance. We then palpated along the medial aspect of the pedicles to rule out cortical breaches. There were none. The nerve roots were not injured. We then connected the unilateral pedicle screws with a lordotic rod. We compressed the construct and secured the rod in place with the caps. We then tightened the caps appropriately. This completed the instrumentation from L4-5.  We now turned our attention to the posterior lateral arthrodesis at L4-5. We used the high-speed drill to decorticate the remainder of the facets, pars, transverse process at L4-5. We then applied a combination of local morselized autograft bone and Kinnex bone graft extender over these decorticated posterior lateral structures. This completed the posterior lateral arthrodesis.  We then obtained hemostasis using bipolar electrocautery. We irrigated the wound out with bacitracin solution. We inspected the thecal sac and nerve roots and noted they were well decompressed. We then removed the retractor. We placed a medium Hemovac drain in the epidural space and tunneled out through separate stab wound. We reapproximated patient's thoracolumbar fascia with interrupted #1 Vicryl suture. We reapproximated patient's subcutaneous tissue with interrupted 2-0 Vicryl suture. The reapproximated patient's skin with Steri-Strips and benzoin. The wound was then coated with bacitracin ointment. A sterile dressing was applied. The drapes were removed. The patient was subsequently  returned to the supine position where they were extubated by the anesthesia team. He was then transported to the post anesthesia care unit in stable condition. All sponge instrument and needle counts were reportedly correct at the end of this case.

## 2014-11-18 NOTE — Transfer of Care (Signed)
Immediate Anesthesia Transfer of Care Note  Patient: Melody Ochoa  Procedure(s) Performed: Procedure(s) with comments: LUMBAR FOUR TO FIVE POSTERIOR LUMBAR FUSION 1 LEVEL (N/A) - L45 laminectomy with posterior lumbar interbody fusion with interbody prosthesis posterior lateral arthrodesis and posterior nonsegmental instrumentation  Patient Location: PACU  Anesthesia Type:General  Level of Consciousness: awake, alert  and oriented  Airway & Oxygen Therapy: Patient Spontanous Breathing and Patient connected to nasal cannula oxygen  Post-op Assessment: Report given to PACU RN, Post -op Vital signs reviewed and stable and Patient moving all extremities X 4  Post vital signs: Reviewed and stable  Complications: No apparent anesthesia complications

## 2014-11-18 NOTE — Progress Notes (Signed)
Subjective:  The patient is alert and pleasant. Her back is appropriately sore.  Objective: Vital signs in last 24 hours: Temp:  [97.8 F (36.6 C)-99 F (37.2 C)] 99 F (37.2 C) (12/14 1708) Pulse Rate:  [81-87] 87 (12/14 1715) Resp:  [13-20] 14 (12/14 1715) BP: (112-151)/(60-74) 112/74 mmHg (12/14 1715) SpO2:  [99 %-100 %] 100 % (12/14 1715)  Intake/Output from previous day:   Intake/Output this shift: Total I/O In: 3050 [I.V.:2800; IV Piggyback:250] Out: 685 [Urine:285; Blood:400]  Physical exam the patient is alert and pleasant. She is moving her lower extremities well.  Lab Results: No results for input(s): WBC, HGB, HCT, PLT in the last 72 hours. BMET No results for input(s): NA, K, CL, CO2, GLUCOSE, BUN, CREATININE, CALCIUM in the last 72 hours.  Studies/Results: Dg Lumbar Spine 1 View  11/18/2014   CLINICAL DATA:  Lumbar spine surgery.  Spondylolisthesis.  EXAM: LUMBAR SPINE - 1 VIEW  COMPARISON:  MRI 09/16/2014  FINDINGS: Single intraoperative image demonstrates surgical marking along the posterior aspect of L5-S1.  IMPRESSION: Surgical marking at L5-S1.   Electronically Signed   By: Markus Daft M.D.   On: 11/18/2014 14:59    Assessment/Plan: The patient is doing well.  LOS: 0 days     Delona Clasby D 11/18/2014, 5:34 PM

## 2014-11-18 NOTE — Anesthesia Preprocedure Evaluation (Signed)
Anesthesia Evaluation  Patient identified by MRN, date of birth, ID band Patient awake    Reviewed: Allergy & Precautions, H&P , NPO status , Patient's Chart, lab work & pertinent test results  History of Anesthesia Complications (+) PONV  Airway        Dental   Pulmonary former smoker,          Cardiovascular + Valvular Problems/Murmurs     Neuro/Psych    GI/Hepatic GERD-  ,  Endo/Other    Renal/GU      Musculoskeletal  (+) Arthritis -,   Abdominal   Peds  Hematology   Anesthesia Other Findings   Reproductive/Obstetrics                             Anesthesia Physical Anesthesia Plan  ASA: II  Anesthesia Plan: General   Post-op Pain Management:    Induction: Intravenous  Airway Management Planned: Oral ETT  Additional Equipment:   Intra-op Plan:   Post-operative Plan: Extubation in OR  Informed Consent: I have reviewed the patients History and Physical, chart, labs and discussed the procedure including the risks, benefits and alternatives for the proposed anesthesia with the patient or authorized representative who has indicated his/her understanding and acceptance.     Plan Discussed with: CRNA, Anesthesiologist and Surgeon  Anesthesia Plan Comments:         Anesthesia Quick Evaluation

## 2014-11-19 ENCOUNTER — Inpatient Hospital Stay (HOSPITAL_COMMUNITY): Payer: Medicare Other

## 2014-11-19 LAB — BASIC METABOLIC PANEL
Anion gap: 10 (ref 5–15)
BUN: 12 mg/dL (ref 6–23)
CO2: 27 mEq/L (ref 19–32)
Calcium: 8.5 mg/dL (ref 8.4–10.5)
Chloride: 102 mEq/L (ref 96–112)
Creatinine, Ser: 0.89 mg/dL (ref 0.50–1.10)
GFR calc Af Amer: 72 mL/min — ABNORMAL LOW (ref >90)
GFR calc non Af Amer: 62 mL/min — ABNORMAL LOW (ref >90)
Glucose, Bld: 84 mg/dL (ref 70–99)
Potassium: 4.2 mEq/L (ref 3.7–5.3)
Sodium: 139 mEq/L (ref 137–147)

## 2014-11-19 LAB — CBC
HCT: 25.7 % — ABNORMAL LOW (ref 36.0–46.0)
Hemoglobin: 8.2 g/dL — ABNORMAL LOW (ref 12.0–15.0)
MCH: 27.3 pg (ref 26.0–34.0)
MCHC: 31.9 g/dL (ref 30.0–36.0)
MCV: 85.7 fL (ref 78.0–100.0)
Platelets: 229 10*3/uL (ref 150–400)
RBC: 3 MIL/uL — ABNORMAL LOW (ref 3.87–5.11)
RDW: 14.6 % (ref 11.5–15.5)
WBC: 6.4 10*3/uL (ref 4.0–10.5)

## 2014-11-19 LAB — URINALYSIS, ROUTINE W REFLEX MICROSCOPIC
Bilirubin Urine: NEGATIVE
Glucose, UA: NEGATIVE mg/dL
Hgb urine dipstick: NEGATIVE
Ketones, ur: NEGATIVE mg/dL
Leukocytes, UA: NEGATIVE
Nitrite: NEGATIVE
Protein, ur: NEGATIVE mg/dL
Specific Gravity, Urine: 1.01 (ref 1.005–1.030)
Urobilinogen, UA: 1 mg/dL (ref 0.0–1.0)
pH: 7 (ref 5.0–8.0)

## 2014-11-19 MED ORDER — ONDANSETRON HCL 4 MG PO TABS
4.0000 mg | ORAL_TABLET | ORAL | Status: DC | PRN
  Administered 2014-11-19: 4 mg via ORAL
  Filled 2014-11-19: qty 1

## 2014-11-19 NOTE — Progress Notes (Deleted)
PT Cancellation Note  Patient Details Name: [name] MRN: [id number] DOB: 1940-12-18   Cancelled Treatment:    Reason Eval/Treat Not Completed: PT screened, no needs identified, will sign off   Ieesha Abbasi 11/19/2014, 9:13 AM

## 2014-11-19 NOTE — Anesthesia Postprocedure Evaluation (Signed)
  Anesthesia Post-op Note  Patient: Melody Ochoa  Procedure(s) Performed: Procedure(s) with comments: LUMBAR FOUR TO FIVE POSTERIOR LUMBAR FUSION 1 LEVEL (N/A) - L45 laminectomy with posterior lumbar interbody fusion with interbody prosthesis posterior lateral arthrodesis and posterior nonsegmental instrumentation  Patient Location: PACU  Anesthesia Type:General  Level of Consciousness: awake, alert , oriented and patient cooperative  Airway and Oxygen Therapy: Patient Spontanous Breathing  Post-op Pain: mild  Post-op Assessment: Post-op Vital signs reviewed, Patient's Cardiovascular Status Stable, Respiratory Function Stable, Patent Airway, No signs of Nausea or vomiting and Pain level controlled  Post-op Vital Signs: stable  Last Vitals:  Filed Vitals:   11/19/14 0751  BP: 118/53  Pulse: 78  Temp: 37.2 C  Resp: 16    Complications: No apparent anesthesia complications

## 2014-11-19 NOTE — Evaluation (Signed)
Occupational Therapy Evaluation Patient Details Name: Melody Ochoa MRN: [id number] DOB: [birth year] Today's Date: 11/19/2014    History of Present Illness Pt s/p L4/5 fusion. PMH - lumbar laminectomy, neck surgery   Clinical Impression   Pt s/p above. Pt independent with ADLs, PTA. Feel pt will benefit from acute OT to increase independence with BADLs, prior to d/c. Plan to practice LB ADLs and tub or shower transfer next session.    Follow Up Recommendations  No OT follow up;Supervision/Assistance - 24 hour    Equipment Recommendations  Other (comment) (AE)    Recommendations for Other Services       Precautions / Restrictions Precautions Precautions: Back;Fall Precaution Booklet Issued: No Precaution Comments: educated on precautions Required Braces or Orthoses: Spinal Brace Spinal Brace: Applied in sitting position;Lumbar corset Restrictions Weight Bearing Restrictions: No      Mobility Bed Mobility Overal bed mobility: Needs Assistance Bed Mobility: Sit to Sidelying;Rolling Rolling: Supervision     Sit to sidelying: Modified independent (Device/Increase time) General bed mobility comments: cues to roll on over/precautions  Transfers Overall transfer level: Needs assistance Equipment used: Rolling walker (2 wheeled) Transfers: Sit to/from Stand Sit to Stand: Min guard                  ADL Overall ADL's : Needs assistance/impaired                 Upper Body Dressing : Minimal assistance;Sitting   Lower Body Dressing: Minimal assistance;With adaptive equipment;Sit to/from stand   Toilet Transfer: Min guard;Ambulation;RW (bed/chair)           Functional mobility during ADLs: Min guard;Rolling walker General ADL Comments: Educated on LB dressing techniques and AE/cost/where to purchase. Pt practiced with reacher and sockaid. Educated on what pt could use for toilet aide for hygiene if it is an issue. Educated on safety (pets, sitting for  LB bathing, safe shoewear). Recommended spouse be with pt for shower transfer and while bathing. Discussed options for shower chair. Educated on option for tub transfer technique. Pt will probably use walk in shower. Educated on back brace. Educated on use of cup for oral care and placement of grooming items to avoid breaking precautions. Educated on positioning of pillows.     Vision                     Perception     Praxis      Pertinent Vitals/Pain Pain Assessment: 0-10 Pain Score: 4  Pain Location: back Pain Intervention(s): Monitored during session;Other (comment);Repositioned (nurse notified)     Hand Dominance     Extremity/Trunk Assessment Upper Extremity Assessment Upper Extremity Assessment: Overall WFL for tasks assessed   Lower Extremity Assessment Lower Extremity Assessment: Defer to PT evaluation       Communication Communication Communication: No difficulties   Cognition Arousal/Alertness: Awake/alert Behavior During Therapy: WFL for tasks assessed/performed Overall Cognitive Status: Within Functional Limits for tasks assessed                     General Comments       Exercises       Shoulder Instructions      Home Living Family/patient expects to be discharged to:: Private residence Living Arrangements: Spouse/significant other Available Help at Discharge: Family;Available 24 hours/day Type of Home: House Home Access: Stairs to enter [employer] of Steps: 2 Entrance Stairs-Rails: None Home Layout: One level     Bathroom Shower/Tub: Tub/shower  unit;Walk-in shower   Bathroom Toilet: Standard     Home Equipment: Cane - quad;Toilet riser;Adaptive equipment Adaptive Equipment: Reacher;Other (Comment) (long handled brush)        Prior Functioning/Environment Level of Independence: Independent             OT Diagnosis: Acute pain   OT Problem List: Decreased strength;Impaired balance (sitting and/or  standing);Decreased range of motion;Decreased knowledge of use of DME or AE;Decreased knowledge of precautions;Pain   OT Treatment/Interventions: Self-care/ADL training;DME and/or AE instruction;Therapeutic activities;Patient/family education;Balance training    OT Goals(Current goals can be found in the care plan section) Acute Rehab OT Goals Patient Stated Goal: not stated OT Goal Formulation: With patient Time For Goal Achievement: 11/26/14 Potential to Achieve Goals: Good ADL Goals Pt Will Perform Grooming: with set-up;standing Pt Will Perform Lower Body Dressing: with set-up;with adaptive equipment;sit to/from stand Pt Will Transfer to Toilet: with modified independence;ambulating (elevated toilet) Pt Will Perform Toileting - Clothing Manipulation and hygiene: with modified independence Pt Will Perform Tub/Shower Transfer: Tub transfer;Shower transfer;with supervision;ambulating Additional ADL Goal #1: Pt will independently verbalize and demonstrate 3/3 back precautions. Additional ADL Goal #2: Pt will independently donn/doff back brace.  OT Frequency: Min 2X/week   Barriers to D/C:            Co-evaluation              End of Session Equipment Utilized During Treatment: Gait belt;Rolling walker;Back brace Nurse Communication: Mobility status  Activity Tolerance: Patient tolerated treatment well Patient left: in bed;with call bell/phone within reach;with family/visitor present   Time: 0301-3143 OT Time Calculation (min): 24 min Charges:  OT General Charges $OT Visit: 1 Procedure OT Evaluation $Initial OT Evaluation Tier I: 1 Procedure OT Treatments $Self Care/Home Management : 8-22 mins G-CodesBenito Mccreedy OTR/L [phone] 11/19/2014, 10:33 AM

## 2014-11-19 NOTE — Progress Notes (Signed)
Utilization review completed.  

## 2014-11-19 NOTE — Progress Notes (Signed)
Patient ID: Melody Ochoa, female   DOB: [birth year], 73 y.o.   MRN: [id number] Subjective:  The patient is alert and pleasant. Her back is appropriately sore. She is having some urinary retention.  Objective: Vital signs in last 24 hours: Temp:  [97.8 F (36.6 C)-99.1 F (37.3 C)] 98.4 F (36.9 C) (12/15 0259) Pulse Rate:  [81-88] 83 (12/15 0259) Resp:  [13-20] 18 (12/15 0259) BP: (112-151)/(54-77) 116/63 mmHg (12/15 0259) SpO2:  [94 %-100 %] 100 % (12/15 0259)  Intake/Output from previous day: 12/14 0701 - 12/15 0700 In: 3050 [I.V.:2800; IV Piggyback:250] Out: 1910 [Urine:1185; Drains:325; Blood:400] Intake/Output this shift:    Physical exam the patient is alert and oriented. She is moving her lower extremities well.  Lab Results:  Recent Labs  11/19/14 0327  WBC 6.4  HGB 8.2*  HCT 25.7*  PLT 229   BMET  Recent Labs  11/19/14 0327  NA 139  K 4.2  CL 102  CO2 27  GLUCOSE 84  BUN 12  CREATININE 0.89  CALCIUM 8.5    Studies/Results: Dg Lumbar Spine 2-3 Views  11/18/2014   CLINICAL DATA:  L4-5 PLIF  EXAM: LUMBAR SPINE - 2-3 VIEW; DG C-ARM 61-120 MIN  COMPARISON:  None.  FINDINGS: Two intraoperative fluoroscopic spot images of the lumbar spine are provided. Interval PLIF at L4-5.  IMPRESSION: PLIF at L4-5.   Electronically Signed   By: Kathreen Devoid   On: 11/18/2014 16:36   Dg Lumbar Spine 1 View  11/18/2014   CLINICAL DATA:  Lumbar spine surgery.  Spondylolisthesis.  EXAM: LUMBAR SPINE - 1 VIEW  COMPARISON:  MRI 09/16/2014  FINDINGS: Single intraoperative image demonstrates surgical marking along the posterior aspect of L5-S1.  IMPRESSION: Surgical marking at L5-S1.   Electronically Signed   By: Markus Daft M.D.   On: 11/18/2014 14:59   Dg C-arm 1-60 Min  11/18/2014   CLINICAL DATA:  L4-5 PLIF  EXAM: LUMBAR SPINE - 2-3 VIEW; DG C-ARM 61-120 MIN  COMPARISON:  None.  FINDINGS: Two intraoperative fluoroscopic spot images of the lumbar spine are provided. Interval  PLIF at L4-5.  IMPRESSION: PLIF at L4-5.   Electronically Signed   By: Kathreen Devoid   On: 11/18/2014 16:36    Assessment/Plan: Postop day #1: We will mobilize her with PT.  Urinary retention: This should resolve.  LOS: 1 day     Kamden Reber D 11/19/2014, 7:41 AM

## 2014-11-19 NOTE — Addendum Note (Signed)
Addendum  created 11/19/14 1302 by Roberts Gaudy, MD   Modules edited: Notes Section   Notes Section:  File: [id number]

## 2014-11-19 NOTE — Progress Notes (Signed)
Anesthesiology Follow-up:  Awake and alert, resting comfortably, no further eye discomfort.  Melody Ochoa

## 2014-11-19 NOTE — Evaluation (Signed)
Physical Therapy Evaluation Patient Details Name: Melody Ochoa MRN: [id number] DOB: 01/06/1941 Today's Date: 11/19/2014   History of Present Illness  Pt s/p L4/5 fusion. PMH - lumbar laminectomy, neck surgery  Clinical Impression  Pt admitted with above diagnosis. Pt currently with functional limitations due to the deficits listed below (see PT Problem List).  Pt will benefit from skilled PT to increase their independence and safety with mobility to allow discharge to the venue listed below. Pt with supportive husband who can provide needed assist at home.     Follow Up Recommendations No PT follow up    Equipment Recommendations  Rolling walker with 5" wheels    Recommendations for Other Services       Precautions / Restrictions Precautions Precautions: Back;Fall Required Braces or Orthoses: Spinal Brace Spinal Brace: Applied in sitting position;Thoracolumbosacral orthotic Restrictions Weight Bearing Restrictions: No      Mobility  Bed Mobility Overal bed mobility: Needs Assistance Bed Mobility: Rolling;Sidelying to Sit;Sit to Sidelying Rolling: Supervision Sidelying to sit: Min assist     Sit to sidelying: Supervision General bed mobility comments: Assist to bring trunk up.  Transfers Overall transfer level: Needs assistance Equipment used: Rolling walker (2 wheeled) Transfers: Sit to/from Stand Sit to Stand: Min assist         General transfer comment: Verbal cues for hand placement and assist for balance.  Ambulation/Gait Ambulation/Gait assistance: Min assist Ambulation Distance (Feet): 200 Feet Assistive device: Rolling walker (2 wheeled) Gait Pattern/deviations: Step-through pattern;Decreased step length - right;Decreased step length - left;Wide base of support;Shuffle   Gait velocity interpretation: Below normal speed for age/gender General Gait Details: Verbal cues to stay closer to walker especially on turns.  Stairs             Wheelchair Mobility    Modified Rankin (Stroke Patients Only)       Balance Overall balance assessment: Needs assistance Sitting-balance support: No upper extremity supported;Feet supported Sitting balance-Leahy Scale: Good     Standing balance support: Single extremity supported Standing balance-Leahy Scale: Poor Standing balance comment: Upper extremity support for static standing                             Pertinent Vitals/Pain Pain Assessment: 0-10 Pain Score: 5  Pain Location: rt leg and back Pain Descriptors / Indicators: Aching Pain Intervention(s): Limited activity within patient's tolerance;Premedicated before session;Repositioned    Home Living Family/patient expects to be discharged to:: Private residence Living Arrangements: Spouse/significant other Available Help at Discharge: Family;Available 24 hours/day Type of Home: House Home Access: Stairs to enter Entrance Stairs-Rails: None Entrance Stairs-Number of Steps: 2 Home Layout: One level Home Equipment: Cane - quad      Prior Function Level of Independence: Independent               Hand Dominance        Extremity/Trunk Assessment   Upper Extremity Assessment: Defer to OT evaluation           Lower Extremity Assessment: Generalized weakness         Communication   Communication: No difficulties  Cognition Arousal/Alertness: Awake/alert Behavior During Therapy: WFL for tasks assessed/performed Overall Cognitive Status: Within Functional Limits for tasks assessed                      General Comments      Exercises        Assessment/Plan  PT Assessment Patient needs continued PT services  PT Diagnosis Difficulty walking;Abnormality of gait;Generalized weakness   PT Problem List Decreased strength;Decreased activity tolerance;Decreased balance;Decreased mobility;Decreased knowledge of use of DME;Decreased knowledge of precautions;Pain  PT  Treatment Interventions DME instruction;Gait training;Stair training;Functional mobility training;Therapeutic activities;Therapeutic exercise;Balance training;Patient/family education   PT Goals (Current goals can be found in the Care Plan section) Acute Rehab PT Goals Patient Stated Goal: Return home PT Goal Formulation: With patient Time For Goal Achievement: 11/22/14 Potential to Achieve Goals: Good    Frequency Min 5X/week   Barriers to discharge        Co-evaluation               End of Session Equipment Utilized During Treatment: Gait belt;Back brace Activity Tolerance: Patient tolerated treatment well Patient left: in chair;with call bell/phone within reach Nurse Communication: Mobility status         Time: 6015-6153 PT Time Calculation (min) (ACUTE ONLY): 16 min   Charges:   PT Evaluation $Initial PT Evaluation Tier I: 1 Procedure PT Treatments $Gait Training: 8-22 mins   PT G Codes:          Prithvi Kooi December 17, 2014, 10:10 AM  [employer] PT [phone]

## 2014-11-20 MED ORDER — DSS 100 MG PO CAPS: 100.0000 mg | ORAL_CAPSULE | Freq: Two times a day (BID) | ORAL | Status: DC

## 2014-11-20 MED ORDER — HYDROCODONE-ACETAMINOPHEN 5-325 MG PO TABS: 1.0000 | ORAL_TABLET | ORAL | Status: DC | PRN

## 2014-11-20 MED ORDER — DIAZEPAM 5 MG PO TABS: 5.0000 mg | ORAL_TABLET | Freq: Four times a day (QID) | ORAL | Status: DC | PRN

## 2014-11-20 NOTE — Progress Notes (Signed)
Physical Therapy Treatment Patient Details Name: Melody Ochoa MRN: [id number] DOB: August 08, 1941 Today's Date: 11/20/2014    History of Present Illness Pt s/p L4/5 fusion. PMH - lumbar laminectomy, neck surgery    PT Comments    Pt admitted with above diagnosis. Pt currently with functional limitations due to the deficits listed below (see PT Problem List). Pt and husband educated in all aspects of back care and mobility.  Pt still needs min guard assist which husband can provide.   Pt will benefit from skilled PT to increase their independence and safety with mobility to allow discharge to the venue listed below.    Follow Up Recommendations  No PT follow up     Equipment Recommendations  Rolling walker with 5" wheels    Recommendations for Other Services       Precautions / Restrictions Precautions Precautions: Back;Fall Precaution Booklet Issued: Yes (comment) Precaution Comments: educated on precautions Required Braces or Orthoses: Spinal Brace Spinal Brace: Applied in sitting position;Lumbar corset Restrictions Weight Bearing Restrictions: No    Mobility  Bed Mobility               General bed mobility comments: in chair.  Reviewed log roll verbally.  Discusses sitting precautions as well.   Transfers Overall transfer level: Needs assistance Equipment used: Rolling walker (2 wheeled) Transfers: Sit to/from Stand Sit to Stand: Min guard         General transfer comment: vc to not pull up on RW and for correct hand placement  Ambulation/Gait Ambulation/Gait assistance: Min guard Ambulation Distance (Feet): 350 Feet Assistive device: Rolling walker (2 wheeled) Gait Pattern/deviations: Step-through pattern;Decreased step length - right;Decreased step length - left;Shuffle;Wide base of support   Gait velocity interpretation: Below normal speed for age/gender General Gait Details: Verbal cues to stay closer to walker especially on turns.  Pt needed cues  to negotiate tight spaces as well.     Stairs Stairs: Yes Stairs assistance: Min assist Stair Management: Backwards;With walker;Step to pattern Number of Stairs: 2 General stair comments: Husband present and demonstrates appropriate assist for pt to go up and down stairs.    Wheelchair Mobility    Modified Rankin (Stroke Patients Only)       Balance Overall balance assessment: Needs assistance;History of Falls         Standing balance support: Bilateral upper extremity supported;During functional activity Standing balance-Leahy Scale: Fair Standing balance comment: can stand statically for short time without UE assist.             High level balance activites: Direction changes;Turns;Sudden stops;Head turns High Level Balance Comments: Pt can perform above with RW.      Cognition Arousal/Alertness: Awake/alert Behavior During Therapy: WFL for tasks assessed/performed Overall Cognitive Status: Impaired/Different from baseline Area of Impairment: Safety/judgement;Following commands;Problem solving       Following Commands: Follows one step commands with increased time;Follows one step commands inconsistently Safety/Judgement: Decreased awareness of safety;Decreased awareness of deficits   Problem Solving: Difficulty sequencing;Requires verbal cues;Slow processing General Comments: Pt with periods of confusion.  Had lots of medication. Somewhat drowsy.       Exercises      General Comments        Pertinent Vitals/Pain Pain Assessment: 0-10 Pain Score: 7  Pain Location: back Pain Descriptors / Indicators: Aching;Shooting;Stabbing Pain Intervention(s): Limited activity within patient's tolerance;Monitored during session;Premedicated before session;Repositioned  VSS    Home Living  Prior Function            PT Goals (current goals can now be found in the care plan section) Progress towards PT goals: Progressing toward  goals    Frequency  Min 5X/week    PT Plan Current plan remains appropriate    Co-evaluation             End of Session Equipment Utilized During Treatment: Gait belt;Back brace Activity Tolerance: Patient tolerated treatment well Patient left: in chair;with call bell/phone within reach     Time: 1039-1105 PT Time Calculation (min) (ACUTE ONLY): 26 min  Charges:  $Gait Training: 8-22 mins $Self Care/Home Management: 8-22                    G CodesIrwin Brakeman F December 20, 2014, 2:00 PM Kiele Heavrin,PT Acute Rehabilitation [phone] (pager)

## 2014-11-20 NOTE — Progress Notes (Signed)
Pt and husband given D/C instructions with Rx's, verbal understanding was provided. Pt's IV was removed prior to D/C. Pt's incision is covered with gauze dressing and has no sign of infection. Pt D/C'd home via wheelchair @ 1140 per MD order. Pt received 3-n-1 prior to D/C. Pt is stable @ D/C and has no other needs at this time. Holli Humbles, RN

## 2014-11-20 NOTE — Progress Notes (Signed)
Occupational Therapy Treatment Patient Details Name: Melody Ochoa MRN: [id number] DOB: 1941-06-05 Today's Date: 11/20/2014    History of present illness Pt s/p L4/5 fusion. PMH - lumbar laminectomy, neck surgery   OT comments  "I'm staying in the bed and not doing anything today". Pt encouraged to participate with OT and discussed importance of mobility in rehab process. Completed education regarding ADL and functional mobility for ADL. Pt will need 3 in 1 for D/C home. Pt walked the hall this am with nsg. Pt states husband will help with all ADL. Pt ready to D/C home with 24/7 S when medically stable.  Follow Up Recommendations  No OT follow up;Supervision/Assistance - 24 hour    Equipment Recommendations  3 in 1 bedside comode    Recommendations for Other Services      Precautions / Restrictions Precautions Precautions: Back;Fall Precaution Booklet Issued: Yes (comment) Precaution Comments: educated on precautions Required Braces or Orthoses: Spinal Brace Spinal Brace: Applied in sitting position;Lumbar corset Restrictions Weight Bearing Restrictions: No       Mobility Bed Mobility       Sidelying to sit: Supervision       General bed mobility comments: cues to roll on over/precautions  Transfers Overall transfer level: Needs assistance Equipment used: Rolling walker (2 wheeled) Transfers: Sit to/from [employer] Sit to Stand: Min guard Stand pivot transfers: Supervision       General transfer comment: vc to not pull up on RW and for correct hand placement    Balance           Standing balance support: Bilateral upper extremity supported Standing balance-Leahy Scale: Fair                     ADL                                         General ADL Comments: Completed education regarding ADL, availability of AE, use of AE for LB ADL and toileting. Pt states that her husband will do everything for her. Also  educated on use of 3 in 1 for use bedside and as a shower chair. Pt verbalized understanding. Pt able to independently donn back brace.      Vision                     Perception     Praxis      Cognition   Behavior During Therapy: [facility] for tasks assessed/performed Overall Cognitive Status: Within Functional Limits for tasks assessed (nsg reports confusion last night; appears at baseline)                       Extremity/Trunk Assessment               Exercises     Shoulder Instructions       General Comments      Pertinent Vitals/ Pain       Pain Assessment: 0-10 Pain Score: 6  Pain Location: back Pain Descriptors / Indicators: Spasm;Sore;Stabbing Pain Intervention(s): Limited activity within patient's tolerance;Monitored during session;Repositioned  Home Living                                          Prior  Functioning/Environment              Frequency       Progress Toward Goals  OT Goals(current goals can now be found in the care plan section)  Progress towards OT goals: Goals met/education completed, patient discharged from OT (appropriate for D/C)  Acute Rehab OT Goals Patient Stated Goal: to go home OT Goal Formulation: With patient Time For Goal Achievement: 11/26/14 Potential to Achieve Goals: Good ADL Goals Pt Will Perform Grooming: with set-up;standing Pt Will Perform Lower Body Dressing: with set-up;with adaptive equipment;sit to/from stand Pt Will Transfer to Toilet: with modified independence;ambulating Pt Will Perform Toileting - Clothing Manipulation and hygiene: with modified independence Pt Will Perform Tub/Shower Transfer: Tub transfer;Shower transfer;with supervision;ambulating Additional ADL Goal #1: Pt will independently verbalize and demonstrate 3/3 back precautions. Additional ADL Goal #2: Pt will independently donn/doff back brace.  Plan Discharge plan remains appropriate     Co-evaluation                 End of Session Equipment Utilized During Treatment: Gait belt;Rolling walker;Back brace   Activity Tolerance Patient tolerated treatment well   Patient Left in chair;with call bell/phone within reach   Nurse Communication Mobility status        Time: 0867-6195 OT Time Calculation (min): 28 min  Charges: OT General Charges $OT Visit: 1 Procedure OT Treatments $Self Care/Home Management : 23-37 mins  Zavian Slowey,HILLARY 11/20/2014, 10:24 AM   Maurie Boettcher, OTR/L  [phone] 11/20/2014

## 2014-11-20 NOTE — Discharge Summary (Signed)
Physician Discharge Summary  Patient ID: Melody Ochoa MRN: [id number] DOB/AGE: 74-Apr-1942 74 y.o.  Admit date: 11/18/2014 Discharge date: 11/20/2014  Admission Diagnoses: L3-4 and L4-5 spinal stenosis, L4-5 spondylolisthesis, lumbago, lumbar radiculopathy, neurogenic claudication  Discharge Diagnoses: The same Active Problems:   Spondylolisthesis of lumbar region   Discharged Condition: good  Hospital Course: I performed an L3-4 and L4-5 decompression with instrumentation and fusion at L4-5 on the patient on 11/18/2014. The surgery went well.  The patient's postoperative course was unremarkable. On postoperative day #2 she requested discharge to home. The patient was given oral and written discharge instructions. All her questions were answered.  Consults: PT Significant Diagnostic Studies: None Treatments: L3-4 and L4-5 decompression with instrumentation and fusion at L4-5. Discharge Exam: Blood pressure 131/68, pulse 92, temperature 99 F (37.2 C), temperature source Oral, resp. rate 18, SpO2 97 %. The patient is alert and pleasant. Her strength is grossly normal in her lower extremities.  Disposition: Home  Discharge Instructions    Call MD for:  difficulty breathing, headache or visual disturbances    Complete by:  As directed      Call MD for:  extreme fatigue    Complete by:  As directed      Call MD for:  hives    Complete by:  As directed      Call MD for:  persistant dizziness or light-headedness    Complete by:  As directed      Call MD for:  persistant nausea and vomiting    Complete by:  As directed      Call MD for:  redness, tenderness, or signs of infection (pain, swelling, redness, odor or green/yellow discharge around incision site)    Complete by:  As directed      Call MD for:  severe uncontrolled pain    Complete by:  As directed      Call MD for:  temperature >100.4    Complete by:  As directed      Diet - low sodium heart healthy    Complete  by:  As directed      Discharge instructions    Complete by:  As directed   Call [phone] for a followup appointment. Take a stool softener while you are using pain medications.     Driving Restrictions    Complete by:  As directed   Do not drive for 2 weeks.     Increase activity slowly    Complete by:  As directed      Lifting restrictions    Complete by:  As directed   Do not lift more than 5 pounds. No excessive bending or twisting.     May shower / Bathe    Complete by:  As directed   He may shower after the pain she is removed 3 days after surgery. Leave the incision alone.     Remove dressing in 24 hours    Complete by:  As directed             Medication List    TAKE these medications        buPROPion 200 MG 12 hr tablet  Commonly known as:  WELLBUTRIN SR  Take 200 mg by mouth 2 (two) times daily.     CALTRATE 600+D PO  Take 1 tablet by mouth daily.     diazepam 5 MG tablet  Commonly known as:  VALIUM  Take 1 tablet (5 mg total) by mouth  every 6 (six) hours as needed for muscle spasms.     DSS 100 MG Caps  Take 100 mg by mouth 2 (two) times daily.     DULoxetine 60 MG capsule  Commonly known as:  CYMBALTA  Take 60 mg by mouth daily.     FOLIC ACID PO  Take 15 mg by mouth daily.     gabapentin 300 MG capsule  Commonly known as:  NEURONTIN  Take 300-600 mg by mouth 2 (two) times daily. 300 mg in am am and 600 mg in pm     HYDROcodone-acetaminophen 5-325 MG per tablet  Commonly known as:  NORCO/VICODIN  Take 1-2 tablets by mouth every 4 (four) hours as needed for moderate pain.     pantoprazole 40 MG tablet  Commonly known as:  PROTONIX  Take 40 mg by mouth daily.         SignedNewman Pies D 11/20/2014, 10:00 AM

## 2014-11-21 ENCOUNTER — Emergency Department (HOSPITAL_COMMUNITY): Payer: Medicare Other

## 2014-11-21 ENCOUNTER — Encounter (HOSPITAL_COMMUNITY): Payer: Self-pay | Admitting: Emergency Medicine

## 2014-11-21 ENCOUNTER — Inpatient Hospital Stay (HOSPITAL_COMMUNITY)
Admission: EM | Admit: 2014-11-21 | Discharge: 2014-11-25 | DRG: 552 | Disposition: A | Discharge status: Skilled Nursing Facility | Payer: Medicare Other | Attending: Internal Medicine | Admitting: Internal Medicine

## 2014-11-21 DIAGNOSIS — M1990 Unspecified osteoarthritis, unspecified site: Secondary | ICD-10-CM | POA: Diagnosis present

## 2014-11-21 DIAGNOSIS — N390 Urinary tract infection, site not specified: Secondary | ICD-10-CM | POA: Diagnosis present

## 2014-11-21 DIAGNOSIS — Z79899 Other long term (current) drug therapy: Secondary | ICD-10-CM

## 2014-11-21 DIAGNOSIS — M545 Low back pain, unspecified: Secondary | ICD-10-CM | POA: Diagnosis present

## 2014-11-21 DIAGNOSIS — M549 Dorsalgia, unspecified: Secondary | ICD-10-CM

## 2014-11-21 DIAGNOSIS — D649 Anemia, unspecified: Secondary | ICD-10-CM | POA: Diagnosis present

## 2014-11-21 DIAGNOSIS — F329 Major depressive disorder, single episode, unspecified: Secondary | ICD-10-CM | POA: Diagnosis present

## 2014-11-21 DIAGNOSIS — Z87891 Personal history of nicotine dependence: Secondary | ICD-10-CM

## 2014-11-21 DIAGNOSIS — Z9889 Other specified postprocedural states: Secondary | ICD-10-CM

## 2014-11-21 DIAGNOSIS — R011 Cardiac murmur, unspecified: Secondary | ICD-10-CM | POA: Diagnosis present

## 2014-11-21 DIAGNOSIS — K5790 Diverticulosis of intestine, part unspecified, without perforation or abscess without bleeding: Secondary | ICD-10-CM | POA: Diagnosis present

## 2014-11-21 DIAGNOSIS — R51 Headache: Secondary | ICD-10-CM | POA: Diagnosis not present

## 2014-11-21 DIAGNOSIS — Z91048 Other nonmedicinal substance allergy status: Secondary | ICD-10-CM

## 2014-11-21 DIAGNOSIS — B965 Pseudomonas (aeruginosa) (mallei) (pseudomallei) as the cause of diseases classified elsewhere: Secondary | ICD-10-CM | POA: Diagnosis present

## 2014-11-21 DIAGNOSIS — K219 Gastro-esophageal reflux disease without esophagitis: Secondary | ICD-10-CM | POA: Diagnosis present

## 2014-11-21 DIAGNOSIS — R110 Nausea: Secondary | ICD-10-CM

## 2014-11-21 DIAGNOSIS — R509 Fever, unspecified: Secondary | ICD-10-CM | POA: Diagnosis present

## 2014-11-21 DIAGNOSIS — K5900 Constipation, unspecified: Secondary | ICD-10-CM | POA: Diagnosis present

## 2014-11-21 DIAGNOSIS — C4490 Unspecified malignant neoplasm of skin, unspecified: Secondary | ICD-10-CM | POA: Diagnosis present

## 2014-11-21 DIAGNOSIS — M62838 Other muscle spasm: Secondary | ICD-10-CM | POA: Diagnosis present

## 2014-11-21 DIAGNOSIS — E611 Iron deficiency: Secondary | ICD-10-CM | POA: Diagnosis present

## 2014-11-21 MED ORDER — ONDANSETRON HCL 4 MG/2ML IJ SOLN
4.0000 mg | INTRAMUSCULAR | Status: AC
  Administered 2014-11-22: 4 mg via INTRAVENOUS
  Filled 2014-11-21: qty 2

## 2014-11-21 MED ORDER — SODIUM CHLORIDE 0.9 % IV BOLUS (SEPSIS)
1000.0000 mL | Freq: Once | INTRAVENOUS | Status: AC
  Administered 2014-11-22: 1000 mL via INTRAVENOUS

## 2014-11-21 MED ORDER — HYDROMORPHONE HCL 1 MG/ML IJ SOLN
0.5000 mg | Freq: Once | INTRAMUSCULAR | Status: AC
  Administered 2014-11-22: 0.5 mg via INTRAVENOUS
  Filled 2014-11-21: qty 1

## 2014-11-21 NOTE — ED Notes (Signed)
Back surgery Monday, worsening pain since, c/o constipation and nausea, no vomiting, fever today, no neuro symptoms, NAD

## 2014-11-21 NOTE — ED Provider Notes (Signed)
CSN: [id number]     Arrival date & time 11/21/14  2201 History   First MD Initiated Contact with Patient 11/21/14 2237     Chief Complaint  Patient presents with  . Back Pain    (Consider location/radiation/quality/duration/timing/severity/associated sxs/prior Treatment) HPI Comments: 73 y/o female with PMH of GERD, diverticulosis, and back pain (3 days s/p L3-L5 decompression and L4/5 fusion by Dr. Arnoldo Morale), presents to the ED for worsening back pain. Patient states, "It feels like I'm striking a nerve in my R buttocks. It's on the left too, but usually worse on the right". She states this pain is new since her surgery and discharge yesterday. Pain is worse with certain movements and not relieved by Vicodin or Valium. Intermittently the pain will travel down the outside of her R leg. Patient states she has been unable to ambulate without assistance at home. She endorses a fever of 101.60F today. Patient was noted to be febrile to 102.655F 2 days ago and 101.55F yesterday, prior to discharge. Patient denies incontinence, sensation loss or paresthesias in her lower extremities, genital or perianal numbness, or purulence from her wound. She states she has not had a BM since before the surgery, but is passing flatus. She notes some nausea and anorexia without emesis.  PCP - Dr. Ouida Sills   The history is provided by the patient. No language interpreter was used.    Past Medical History  Diagnosis Date  . PONV (postoperative nausea and vomiting)     yrs ago  . Heart murmur   . Depression   . GERD (gastroesophageal reflux disease)   . Arthritis   . Cancer     skin  . Diverticulosis   . Dizziness     patient had episode of dizziness when came in room. hx past no dx   Past Surgical History  Procedure Laterality Date  . Tonsillectomy    . Facelift  94  . Cervical disc surgery  09  . Back surgery  13   No family history on file. History  Substance Use Topics  . Smoking status: Former  Smoker -- 1.00 packs/day for 40 years    Types: Cigarettes    Quit date: 11/13/1996  . Smokeless tobacco: Not on file  . Alcohol Use: Yes     Comment: occ wine   OB History    No data available      Review of Systems  Constitutional: Negative for fever.  Respiratory: Negative for shortness of breath.   Cardiovascular: Negative for chest pain.  Gastrointestinal: Positive for nausea and constipation. Negative for vomiting and abdominal pain.  Genitourinary:       Negative for incontinence  Musculoskeletal: Positive for back pain.  Neurological: Negative for weakness and numbness.  All other systems reviewed and are negative.   Allergies  Other  Home Medications   Prior to Admission medications   Medication Sig Start Date End Date Taking? Authorizing Provider  buPROPion (WELLBUTRIN SR) 200 MG 12 hr tablet Take 200 mg by mouth 2 (two) times daily. 10/06/14  Yes Historical Provider, MD  Calcium Carbonate-Vitamin D (CALTRATE 600+D PO) Take 1 tablet by mouth daily.   Yes Historical Provider, MD  diazepam (VALIUM) 5 MG tablet Take 1 tablet (5 mg total) by mouth every 6 (six) hours as needed for muscle spasms. 11/20/14  Yes Newman Pies, MD  docusate sodium 100 MG CAPS Take 100 mg by mouth 2 (two) times daily. 11/20/14  Yes Newman Pies, MD  DULoxetine (  CYMBALTA) 60 MG capsule Take 60 mg by mouth daily.   Yes Historical Provider, MD  FOLIC ACID PO Take 15 mg by mouth daily.   Yes Historical Provider, MD  gabapentin (NEURONTIN) 300 MG capsule Take 600 mg by mouth 2 (two) times daily. 300 mg in am am and 600 mg in pm 09/01/14  Yes Historical Provider, MD  HYDROcodone-acetaminophen (NORCO/VICODIN) 5-325 MG per tablet Take 1-2 tablets by mouth every 4 (four) hours as needed for moderate pain. 11/20/14  Yes Newman Pies, MD  L-Methylfolate (DEPLIN) 15 MG TABS Take 1 tablet by mouth daily.   Yes Historical Provider, MD  pantoprazole (PROTONIX) 40 MG tablet Take 40 mg by mouth daily.  10/16/14  Yes Historical Provider, MD   BP 146/69 mmHg  Pulse 84  Temp(Src) 100.2 F (37.9 C) (Rectal)  Resp 24  Ht 5\' 4"  (1.626 m)  Wt 202 lb (91.627 kg)  BMI 34.66 kg/m2  SpO2 99%   Physical Exam  Constitutional: She is oriented to person, place, and time. She appears well-developed and well-nourished. No distress.  Nontoxic/nonseptic appearing  HENT:  Head: Normocephalic and atraumatic.  Eyes: Conjunctivae and EOM are normal. No scleral icterus.  Neck: Normal range of motion.  Cardiovascular: Normal rate, regular rhythm and intact distal pulses.   DP and PT pulses 2+ b/l  Pulmonary/Chest: Effort normal. No respiratory distress. She has no wheezes.  Respirations even and unlabored  Musculoskeletal: She exhibits tenderness.       Lumbar back: She exhibits tenderness. She exhibits no bony tenderness.       Back:  Surgical midline scar with C/D/I dressing overtop. Mild induration without significant erythema or heat to touch. No purulence or red streaking. TTP noted to R buttocks.  Neurological: She is alert and oriented to person, place, and time. She exhibits normal muscle tone. Coordination normal.  GCS 15. Sensation intact in bilateral lower extremities. Patient moving extremities without ataxia. Patellar and Achilles reflexes plus bilaterally.  Skin: Skin is warm and dry. No rash noted. She is not diaphoretic. No erythema. No pallor.  Psychiatric: She has a normal mood and affect. Her behavior is normal.  Nursing note and vitals reviewed.   ED Course  Procedures (including critical care time) Labs Review Labs Reviewed  CBC WITH DIFFERENTIAL - Abnormal; Notable for the following:    RBC 2.98 (*)    Hemoglobin 8.0 (*)    HCT 24.7 (*)    Neutrophils Relative % 88 (*)    Neutro Abs 8.8 (*)    Lymphocytes Relative 5 (*)    Lymphs Abs 0.5 (*)    All other components within normal limits  COMPREHENSIVE METABOLIC PANEL - Abnormal; Notable for the following:    Sodium 133  (*)    Potassium 3.6 (*)    Chloride 93 (*)    Glucose, Bld 117 (*)    Albumin 3.0 (*)    GFR calc non Af Amer 85 (*)    All other components within normal limits  URINALYSIS, ROUTINE W REFLEX MICROSCOPIC - Abnormal; Notable for the following:    APPearance CLOUDY (*)    Hgb urine dipstick SMALL (*)    Ketones, ur 15 (*)    Protein, ur 30 (*)    Nitrite POSITIVE (*)    Leukocytes, UA SMALL (*)    All other components within normal limits  URINE MICROSCOPIC-ADD ON - Abnormal; Notable for the following:    Bacteria, UA MANY (*)    Casts  HYALINE CASTS (*)    All other components within normal limits  URINE CULTURE  CULTURE, BLOOD (ROUTINE X 2)  CULTURE, BLOOD (ROUTINE X 2)    Imaging Review Dg Lumbar Spine Complete  11/22/2014   CLINICAL DATA:  Recent lumbar fusion with constipation. Lower back pain.  EXAM: LUMBAR SPINE - COMPLETE 4+ VIEW  COMPARISON:  11/18/2014 fluoroscopy  FINDINGS: Recent L4-5 posterior lumbar interbody fusion with rod and pedicle screw fixation. Vertebral body heights are maintained - no evidence of acute fracture. The intervertebral graft remains in good position. The rod and pedicle screws are located.  Degenerative endplate spurring throughout the lumbar spine. No endplate erosion.  Osteopenia.  IMPRESSION: L4-5 PLIF with rod and pedicle screw fixation.  No adverse findings.   Electronically Signed   By: Jorje Guild M.D.   On: 11/22/2014 00:24   Dg Abd 2 Views  11/22/2014   CLINICAL DATA:  Constipation.  EXAM: ABDOMEN - 2 VIEW  COMPARISON:  None.  FINDINGS: No abnormal bowel dilatation is noted. Status post surgical posterior fusion of lower lumbar spine. Moderate amount of stool is noted in the sigmoid colon and rectum concerning for possible constipation. No definite renal calculi are noted. Phleboliths are noted in the pelvis.  IMPRESSION: No abnormal bowel dilatation is noted. Moderate stool burden is noted in the sigmoid colon and rectum suggesting  constipation.   Electronically Signed   By: Sabino Dick M.D.   On: 11/22/2014 00:24     EKG Interpretation None      MDM   Final diagnoses:  Back pain  Nausea  Constipation  UTI (lower urinary tract infection)    73 year old female 3 days status post lumbar fusion and decompression presents to the emergency department for further evaluation of back pain. Patient was found to be febrile to 100.62F on arrival. She states that she had a temperature at home of 101.23F. Fever responded well to antipyretics. Patient does appear to have a urinary tract infection which may be causing her fever today. Patient, however, was noted to be febrile to 102.37F 1 day post decompression and fusion. If fever does not resolve with treatment of UTI, believe further evaluation of surgical site may be warranted. No evidence of abscess on physical exam. Xray without any acute findings postoperatively. Case discussed with Dr. Vertell Limber of neurosurgery who recommends admission to [facility] for pain control and UTI management at this time. Neurosurgery to consult on patient case. Case discussed with Dr. Hal Hope of [facility] who will admit. Temp admit orders placed.   Filed Vitals:   11/22/14 0145 11/22/14 0202 11/22/14 0215 11/22/14 0230  BP: 138/87 148/66 141/67 146/69  Pulse: 86 88 88 84  Temp:      TempSrc:      Resp:  24    Height:      Weight:      SpO2: 97% 98% 97% 99%     Antonietta Breach, PA-C 11/22/14 0257  Julianne Rice, MD 11/22/14 [phone]

## 2014-11-22 ENCOUNTER — Inpatient Hospital Stay (HOSPITAL_COMMUNITY): Payer: Medicare Other

## 2014-11-22 ENCOUNTER — Encounter (HOSPITAL_COMMUNITY): Payer: Self-pay | Admitting: Internal Medicine

## 2014-11-22 DIAGNOSIS — M1990 Unspecified osteoarthritis, unspecified site: Secondary | ICD-10-CM | POA: Diagnosis present

## 2014-11-22 DIAGNOSIS — F329 Major depressive disorder, single episode, unspecified: Secondary | ICD-10-CM | POA: Diagnosis present

## 2014-11-22 DIAGNOSIS — R509 Fever, unspecified: Secondary | ICD-10-CM | POA: Diagnosis present

## 2014-11-22 DIAGNOSIS — R51 Headache: Secondary | ICD-10-CM | POA: Diagnosis not present

## 2014-11-22 DIAGNOSIS — Z87891 Personal history of nicotine dependence: Secondary | ICD-10-CM | POA: Diagnosis not present

## 2014-11-22 DIAGNOSIS — K5900 Constipation, unspecified: Secondary | ICD-10-CM | POA: Diagnosis present

## 2014-11-22 DIAGNOSIS — A419 Sepsis, unspecified organism: Secondary | ICD-10-CM | POA: Insufficient documentation

## 2014-11-22 DIAGNOSIS — R011 Cardiac murmur, unspecified: Secondary | ICD-10-CM | POA: Diagnosis present

## 2014-11-22 DIAGNOSIS — M62838 Other muscle spasm: Secondary | ICD-10-CM | POA: Diagnosis present

## 2014-11-22 DIAGNOSIS — Z91048 Other nonmedicinal substance allergy status: Secondary | ICD-10-CM | POA: Diagnosis not present

## 2014-11-22 DIAGNOSIS — Z79899 Other long term (current) drug therapy: Secondary | ICD-10-CM | POA: Diagnosis not present

## 2014-11-22 DIAGNOSIS — B965 Pseudomonas (aeruginosa) (mallei) (pseudomallei) as the cause of diseases classified elsewhere: Secondary | ICD-10-CM | POA: Diagnosis present

## 2014-11-22 DIAGNOSIS — E611 Iron deficiency: Secondary | ICD-10-CM | POA: Diagnosis present

## 2014-11-22 DIAGNOSIS — M545 Low back pain, unspecified: Secondary | ICD-10-CM | POA: Diagnosis present

## 2014-11-22 DIAGNOSIS — K5790 Diverticulosis of intestine, part unspecified, without perforation or abscess without bleeding: Secondary | ICD-10-CM | POA: Diagnosis present

## 2014-11-22 DIAGNOSIS — K219 Gastro-esophageal reflux disease without esophagitis: Secondary | ICD-10-CM | POA: Diagnosis present

## 2014-11-22 DIAGNOSIS — Z9889 Other specified postprocedural states: Secondary | ICD-10-CM | POA: Diagnosis not present

## 2014-11-22 DIAGNOSIS — D649 Anemia, unspecified: Secondary | ICD-10-CM | POA: Diagnosis present

## 2014-11-22 DIAGNOSIS — M961 Postlaminectomy syndrome, not elsewhere classified: Secondary | ICD-10-CM | POA: Insufficient documentation

## 2014-11-22 DIAGNOSIS — C4490 Unspecified malignant neoplasm of skin, unspecified: Secondary | ICD-10-CM | POA: Diagnosis present

## 2014-11-22 DIAGNOSIS — M549 Dorsalgia, unspecified: Secondary | ICD-10-CM | POA: Diagnosis present

## 2014-11-22 DIAGNOSIS — N390 Urinary tract infection, site not specified: Secondary | ICD-10-CM | POA: Diagnosis present

## 2014-11-22 LAB — URINALYSIS, ROUTINE W REFLEX MICROSCOPIC
Bilirubin Urine: NEGATIVE
Glucose, UA: NEGATIVE mg/dL
Ketones, ur: 15 mg/dL — AB
Nitrite: POSITIVE — AB
Protein, ur: 30 mg/dL — AB
Specific Gravity, Urine: 1.022 (ref 1.005–1.030)
Urobilinogen, UA: 1 mg/dL (ref 0.0–1.0)
pH: 6.5 (ref 5.0–8.0)

## 2014-11-22 LAB — CBC WITH DIFFERENTIAL/PLATELET
Basophils Absolute: 0 10*3/uL (ref 0.0–0.1)
Basophils Absolute: 0 10*3/uL (ref 0.0–0.1)
Basophils Relative: 0 % (ref 0–1)
Basophils Relative: 0 % (ref 0–1)
Eosinophils Absolute: 0 10*3/uL (ref 0.0–0.7)
Eosinophils Absolute: 0.1 10*3/uL (ref 0.0–0.7)
Eosinophils Relative: 0 % (ref 0–5)
Eosinophils Relative: 1 % (ref 0–5)
HCT: 23.6 % — ABNORMAL LOW (ref 36.0–46.0)
HCT: 24.7 % — ABNORMAL LOW (ref 36.0–46.0)
Hemoglobin: 7.7 g/dL — ABNORMAL LOW (ref 12.0–15.0)
Hemoglobin: 8 g/dL — ABNORMAL LOW (ref 12.0–15.0)
Lymphocytes Relative: 10 % — ABNORMAL LOW (ref 12–46)
Lymphocytes Relative: 5 % — ABNORMAL LOW (ref 12–46)
Lymphs Abs: 0.5 10*3/uL — ABNORMAL LOW (ref 0.7–4.0)
Lymphs Abs: 0.9 10*3/uL (ref 0.7–4.0)
MCH: 26.8 pg (ref 26.0–34.0)
MCH: 27.1 pg (ref 26.0–34.0)
MCHC: 32.4 g/dL (ref 30.0–36.0)
MCHC: 32.6 g/dL (ref 30.0–36.0)
MCV: 82.9 fL (ref 78.0–100.0)
MCV: 83.1 fL (ref 78.0–100.0)
Monocytes Absolute: 0.7 10*3/uL (ref 0.1–1.0)
Monocytes Absolute: 0.7 10*3/uL (ref 0.1–1.0)
Monocytes Relative: 7 % (ref 3–12)
Monocytes Relative: 8 % (ref 3–12)
Neutro Abs: 6.7 10*3/uL (ref 1.7–7.7)
Neutro Abs: 8.8 10*3/uL — ABNORMAL HIGH (ref 1.7–7.7)
Neutrophils Relative %: 81 % — ABNORMAL HIGH (ref 43–77)
Neutrophils Relative %: 88 % — ABNORMAL HIGH (ref 43–77)
Platelets: 200 10*3/uL (ref 150–400)
Platelets: 204 10*3/uL (ref 150–400)
RBC: 2.84 MIL/uL — ABNORMAL LOW (ref 3.87–5.11)
RBC: 2.98 MIL/uL — ABNORMAL LOW (ref 3.87–5.11)
RDW: 14.1 % (ref 11.5–15.5)
RDW: 14.2 % (ref 11.5–15.5)
WBC: 10.1 10*3/uL (ref 4.0–10.5)
WBC: 8.4 10*3/uL (ref 4.0–10.5)

## 2014-11-22 LAB — COMPREHENSIVE METABOLIC PANEL
ALT: 10 U/L (ref 0–35)
ALT: 9 U/L (ref 0–35)
AST: 14 U/L (ref 0–37)
AST: 17 U/L (ref 0–37)
Albumin: 2.9 g/dL — ABNORMAL LOW (ref 3.5–5.2)
Albumin: 3 g/dL — ABNORMAL LOW (ref 3.5–5.2)
Alkaline Phosphatase: 54 U/L (ref 39–117)
Alkaline Phosphatase: 60 U/L (ref 39–117)
Anion gap: 12 (ref 5–15)
Anion gap: 14 (ref 5–15)
BUN: 12 mg/dL (ref 6–23)
BUN: 13 mg/dL (ref 6–23)
CO2: 26 mEq/L (ref 19–32)
CO2: 26 mEq/L (ref 19–32)
Calcium: 8.4 mg/dL (ref 8.4–10.5)
Calcium: 9.3 mg/dL (ref 8.4–10.5)
Chloride: 93 mEq/L — ABNORMAL LOW (ref 96–112)
Chloride: 96 mEq/L (ref 96–112)
Creatinine, Ser: 0.63 mg/dL (ref 0.50–1.10)
Creatinine, Ser: 0.66 mg/dL (ref 0.50–1.10)
GFR calc Af Amer: >90 mL/min (ref >90)
GFR calc Af Amer: >90 mL/min (ref >90)
GFR calc non Af Amer: 85 mL/min — ABNORMAL LOW (ref >90)
GFR calc non Af Amer: 86 mL/min — ABNORMAL LOW (ref >90)
Glucose, Bld: 100 mg/dL — ABNORMAL HIGH (ref 70–99)
Glucose, Bld: 117 mg/dL — ABNORMAL HIGH (ref 70–99)
Potassium: 3.2 mEq/L — ABNORMAL LOW (ref 3.7–5.3)
Potassium: 3.6 mEq/L — ABNORMAL LOW (ref 3.7–5.3)
Sodium: 133 mEq/L — ABNORMAL LOW (ref 137–147)
Sodium: 134 mEq/L — ABNORMAL LOW (ref 137–147)
Total Bilirubin: 0.3 mg/dL (ref 0.3–1.2)
Total Bilirubin: 0.4 mg/dL (ref 0.3–1.2)
Total Protein: 6.5 g/dL (ref 6.0–8.3)
Total Protein: 6.8 g/dL (ref 6.0–8.3)

## 2014-11-22 LAB — URINE MICROSCOPIC-ADD ON

## 2014-11-22 MED ORDER — ACETAMINOPHEN 325 MG PO TABS: 650.0000 mg | ORAL_TABLET | Freq: Four times a day (QID) | ORAL | Status: DC | PRN

## 2014-11-22 MED ORDER — HYDROMORPHONE HCL 1 MG/ML IJ SOLN: 0.5000 mg | Freq: Once | INTRAMUSCULAR | Status: DC | PRN

## 2014-11-22 MED ORDER — ONDANSETRON HCL 4 MG PO TABS: 4.0000 mg | ORAL_TABLET | Freq: Four times a day (QID) | ORAL | Status: DC | PRN

## 2014-11-22 MED ORDER — CEFTRIAXONE SODIUM IN DEXTROSE 20 MG/ML IV SOLN
1.0000 g | INTRAVENOUS | Status: DC
  Administered 2014-11-22 – 2014-11-24 (×2): 1 g via INTRAVENOUS
  Filled 2014-11-22 (×3): qty 50

## 2014-11-22 MED ORDER — ACETAMINOPHEN 650 MG RE SUPP: 650.0000 mg | Freq: Four times a day (QID) | RECTAL | Status: DC | PRN

## 2014-11-22 MED ORDER — ONDANSETRON HCL 4 MG/2ML IJ SOLN: 4.0000 mg | Freq: Four times a day (QID) | INTRAMUSCULAR | Status: DC | PRN

## 2014-11-22 MED ORDER — DULOXETINE HCL 60 MG PO CPEP
60.0000 mg | ORAL_CAPSULE | Freq: Every day | ORAL | Status: DC
  Administered 2014-11-22 – 2014-11-25 (×4): 60 mg via ORAL
  Filled 2014-11-22 (×4): qty 1

## 2014-11-22 MED ORDER — PANTOPRAZOLE SODIUM 40 MG PO TBEC
40.0000 mg | DELAYED_RELEASE_TABLET | Freq: Every day | ORAL | Status: DC
  Administered 2014-11-22 – 2014-11-25 (×4): 40 mg via ORAL
  Filled 2014-11-22 (×4): qty 1

## 2014-11-22 MED ORDER — FOLIC ACID 1 MG PO TABS
15.0000 mg | ORAL_TABLET | Freq: Every day | ORAL | Status: DC
  Administered 2014-11-23: 15 mg via ORAL
  Filled 2014-11-22 (×3): qty 15

## 2014-11-22 MED ORDER — GABAPENTIN 300 MG PO CAPS
600.0000 mg | ORAL_CAPSULE | Freq: Two times a day (BID) | ORAL | Status: DC
  Administered 2014-11-22 – 2014-11-25 (×7): 600 mg via ORAL
  Filled 2014-11-22 (×9): qty 2

## 2014-11-22 MED ORDER — ACETAMINOPHEN 650 MG RE SUPP
650.0000 mg | RECTAL | Status: DC | PRN
  Administered 2014-11-22: 650 mg via RECTAL
  Filled 2014-11-22: qty 1

## 2014-11-22 MED ORDER — BUPROPION HCL ER (SR) 100 MG PO TB12
200.0000 mg | ORAL_TABLET | Freq: Two times a day (BID) | ORAL | Status: DC
  Administered 2014-11-22 – 2014-11-25 (×7): 200 mg via ORAL
  Filled 2014-11-22 (×8): qty 2

## 2014-11-22 MED ORDER — DOCUSATE SODIUM 100 MG PO CAPS
100.0000 mg | ORAL_CAPSULE | Freq: Two times a day (BID) | ORAL | Status: DC
  Administered 2014-11-22 – 2014-11-25 (×7): 100 mg via ORAL
  Filled 2014-11-22 (×8): qty 1

## 2014-11-22 MED ORDER — SODIUM CHLORIDE 0.9 % IV SOLN
INTRAVENOUS | Status: AC
  Administered 2014-11-22: 04:00:00 via INTRAVENOUS

## 2014-11-22 MED ORDER — DEPLIN 15 MG PO TABS
1.0000 | ORAL_TABLET | Freq: Every day | ORAL | Status: DC
  Administered 2014-11-24 – 2014-11-25 (×2): 1 via ORAL

## 2014-11-22 MED ORDER — HYDROMORPHONE HCL 1 MG/ML IJ SOLN
1.0000 mg | INTRAMUSCULAR | Status: DC | PRN
  Administered 2014-11-22 – 2014-11-24 (×5): 1 mg via INTRAVENOUS
  Filled 2014-11-22 (×5): qty 1

## 2014-11-22 MED ORDER — DEXTROSE 5 % IV SOLN
1.0000 g | Freq: Once | INTRAVENOUS | Status: AC
  Administered 2014-11-22: 1 g via INTRAVENOUS
  Filled 2014-11-22: qty 10

## 2014-11-22 MED ORDER — DIAZEPAM 5 MG PO TABS
5.0000 mg | ORAL_TABLET | Freq: Four times a day (QID) | ORAL | Status: DC | PRN
  Administered 2014-11-22 (×2): 5 mg via ORAL
  Filled 2014-11-22 (×2): qty 1

## 2014-11-22 NOTE — Progress Notes (Signed)
Subjective: Patient reports "My back and my buttocks and my head hurts" "i want to get to walking again"  Objective: Vital signs in last 24 hours: Temp:  [98.2 F (36.8 C)-100.7 F (38.2 C)] 98.9 F (37.2 C) (12/18 0400) Pulse Rate:  [84-98] 86 (12/18 0400) Resp:  [10-24] 22 (12/18 0400) BP: (138-165)/(63-102) 140/63 mmHg (12/18 0400) SpO2:  [96 %-100 %] 96 % (12/18 0400) Weight:  [91.627 kg (202 lb)] 91.627 kg (202 lb) (12/18 0522)  Intake/Output from previous day: 12/17 0701 - 12/18 0700 In: 1400 [P.O.:350; I.V.:1050] Out: 301 [Urine:300; Stool:1] Intake/Output this shift: Total I/O In: 240 [P.O.:240] Out: -   Alert, conversant, reporting lumbar and buttock pain, responding to medications at present. She also notes a headache just this morning. Strength is good BLE. Drsg intact, dry, removed only for incision inspection: incision healing nicely without erythema, swelling, or drainage. No bruising. Steri's rolling up but skin edges well-approximated. MRI reviewed by Dr. Vertell Limber: no areas of concern, normal for 3 days post-op.   Lab Results:  Recent Labs  11/22/14 0015 11/22/14 0910  WBC 10.1 8.4  HGB 8.0* 7.7*  HCT 24.7* 23.6*  PLT 200 204   BMET  Recent Labs  11/22/14 0015 11/22/14 0910  NA 133* 134*  K 3.6* 3.2*  CL 93* 96  CO2 26 26  GLUCOSE 117* 100*  BUN 12 13  CREATININE 0.66 0.63  CALCIUM 9.3 8.4    Studies/Results: Dg Lumbar Spine Complete  11/22/2014   CLINICAL DATA:  Recent lumbar fusion with constipation. Lower back pain.  EXAM: LUMBAR SPINE - COMPLETE 4+ VIEW  COMPARISON:  11/18/2014 fluoroscopy  FINDINGS: Recent L4-5 posterior lumbar interbody fusion with rod and pedicle screw fixation. Vertebral body heights are maintained - no evidence of acute fracture. The intervertebral graft remains in good position. The rod and pedicle screws are located.  Degenerative endplate spurring throughout the lumbar spine. No endplate erosion.  Osteopenia.   IMPRESSION: L4-5 PLIF with rod and pedicle screw fixation.  No adverse findings.   Electronically Signed   By: Jorje Guild M.D.   On: 11/22/2014 00:24   Mr Lumbar Spine Wo Contrast  11/22/2014   CLINICAL DATA:  Severe low back pain. Fever and incontinence. Surgery lumbar surgery 11/18/2014.  EXAM: MRI LUMBAR SPINE WITHOUT CONTRAST  TECHNIQUE: Multiplanar, multisequence MR imaging of the lumbar spine was performed. No intravenous contrast was administered.  COMPARISON:  09/16/2014  FINDINGS: The examination had to be discontinued prior to completion due to patient pain and inability to tolerate further imaging. Sagittal T1, T2, and STIR and axial T2 weighted images were obtained.  Vertebral alignment is unchanged without significant listhesis. Vertebral body heights are preserved. Sequelae of interval L4-5 PLIF are identified. There is diffuse lumbar disc desiccation. Multiple small Schmorl's nodes are again seen. Conus medullaris is normal in signal and terminates at L1. Small T2 hyperintense renal lesions are similar to the prior study and may represent cysts. 2.2 cm left adrenal nodule is unchanged.  T10-11 and T11-12: Only imaged sagittally. Mild disc bulging without evidence of significant spinal canal stenosis. Mild right neural foraminal narrowing at T10-11.  T12-L1:  Mild disc bulging without stenosis, unchanged.  L1-2:  Mild disc bulging without stenosis, unchanged.  L2-3: Mild disc bulging and mild facet hypertrophy without stenosis, unchanged.  L3-4: Interval bilateral laminotomies. Unchanged circumferential disc bulging and facet hypertrophy contributing to spinal canal and lateral recess narrowing as well as mild-to-moderate left neural foraminal stenosis. Superior extension  of postoperative fluid collection at L4-5 contributes to increased spinal stenosis at and just below the disc space level, further described below.  L4-5: Interval wide posterior decompression and posterior and interbody  fusion. Fluid collection at the laminectomy site measures approximately 2.4 x 2.5 x 4.6 cm (transverse x AP x craniocaudal). There is a 3.0 x 1.3 x 1.5 cm oblong markedly T2 hypointense focus at the anterior aspect of the fluid collection with complete thecal sac effacement at this level. Neural foramina are not well evaluated due to artifact and postsurgical changes. Postoperative fluid collection in the superficial soft tissues at the incision measures 3.8 x 1.5 x 8.6 cm. There is edema diffusely within the posterior paraspinal soft tissues.  L5-S1: Severe right and moderate left facet arthrosis with slightly increased size of small right facet joint effusion. No definite progressive marrow edema involving the facets. Disc bulging and facet disease results in moderate right and mild left neural foraminal stenosis without spinal stenosis, unchanged.  IMPRESSION: 1. Interval posterior decompression and fusion at L4-5. 4.6 cm postoperative fluid collection at the laminectomy site with additional 3.8 cm low signal focus along its anterior margin. This is nonspecific, with considerations including retracted hematoma, gas, and possibly residual osseous fragments. Further evaluation with CT is suggested. There is resultant complete thecal sac effacement at L4-5. Fluid collection results in increased spinal canal narrowing at L3-4 as well. Superimposed infection is not excluded. 2. Severe facet arthrosis at L5-S1 with slightly increased facet joint effusion on the right.   Electronically Signed   By: Logan Bores   On: 11/22/2014 07:35   Dg Abd 2 Views  11/22/2014   CLINICAL DATA:  Constipation.  EXAM: ABDOMEN - 2 VIEW  COMPARISON:  None.  FINDINGS: No abnormal bowel dilatation is noted. Status post surgical posterior fusion of lower lumbar spine. Moderate amount of stool is noted in the sigmoid colon and rectum concerning for possible constipation. No definite renal calculi are noted. Phleboliths are noted in the  pelvis.  IMPRESSION: No abnormal bowel dilatation is noted. Moderate stool burden is noted in the sigmoid colon and rectum suggesting constipation.   Electronically Signed   By: Sabino Dick M.D.   On: 11/22/2014 00:24    Assessment/Plan: Stable from NS perspective  LOS: 1 day  Mobilize in LSO with PT. (Pt will notify husband to bring her LSO back for use when OOB) Will work on pain control. Reassured pt re: expected pain levels and normal course of healing. Discussed MRI results: expected fluid collections 3 days post-op, nerves decompressed, hardware well positioned.   Verdis Prime 11/22/2014, 10:20 AM

## 2014-11-22 NOTE — ED Notes (Signed)
Reported repeat rectal temp of 100.2 to kelly, pa. She acknowledges, no new orders at this time.

## 2014-11-22 NOTE — ED Notes (Signed)
Dr. Lita Mains and Claiborne Billings, PA, at the bedside.

## 2014-11-22 NOTE — Progress Notes (Signed)
  Pt admitted to the unit. Pt is stable, alert and oriented per baseline. Oriented to room, staff, and call bell. Educated to call for any assistance. Bed in lowest position, call bell within reach- will continue to monitor. 

## 2014-11-22 NOTE — Progress Notes (Signed)
Soap sud enema completed- patient retained fluid for about 10 mins before dark brown liquid came out. Small bits of stool was expelled from patient at this time. Will continue to monitor

## 2014-11-22 NOTE — Progress Notes (Signed)
PT Cancellation Note  Patient Details Name: Melody Ochoa MRN: [id number] DOB: 10-26-1941   Cancelled Treatment:    Reason Eval/Treat Not Completed: Medical issues which prohibited therapy. Tried to text MD about bedrest orders however pt still on bedrest per chart.  Noted in Neurosurgery progress note that they state pt can get OOB but not until husband brings brace.  Husband coming tonight with brace.  Spoke with nurse who agrees to call MD to clarify activity in orders as well as nurse in agreement to wait for brace.  Will return to evaluate pt in am.  Thanks.    Irwin Brakeman F 11/22/2014, 1:56 PM [facility] [phone] (pager)

## 2014-11-22 NOTE — ED Notes (Signed)
Dr. Karkandy, hospitalist, at the bedside.  

## 2014-11-22 NOTE — ED Notes (Signed)
Phlebotomy at the bedside  

## 2014-11-22 NOTE — Progress Notes (Signed)
Patient had 1 large stool.

## 2014-11-22 NOTE — H&P (Signed)
[facility] History and Physical  Melody Ochoa EXB:[id number] DOB: 12-24-1940 DOA: 11/21/2014  Referring physician: ER physician. PCP: Kirk Ruths., MD   Chief Complaint: Low back pain.  HPI: Melody Ochoa is a 73 y.o. female who had recent lumbar surgery and was discharged 2 days ago started experiencing low back pain with minimal movement since she reached home. Denies any incontinence of urine are bowels. Patient also had subjective feeling of fever or chills. Patient has been having some nausea but denies any vomiting abdominal pain or diarrhea. Patient has not moved her bowels for last 4 days. In the ER patient was found to be having fever with UA showing Features consistent with UTI. Patient did not have significant pain on moving her lower ex 20 periphery of the right lower extremity. Patient has been admitted for further management of her low back pain and fever. Blood cultures have been obtained and patient has been started on ceftriaxone. On call neurosurgeon Dr. Vertell Limber was modified by the ER physician.   Review of Systems: As presented in the history of presenting illness, rest negative.  Past Medical History  Diagnosis Date  . PONV (postoperative nausea and vomiting)     yrs ago  . Heart murmur   . Depression   . GERD (gastroesophageal reflux disease)   . Arthritis   . Cancer     skin  . Diverticulosis   . Dizziness     patient had episode of dizziness when came in room. hx past no dx   Past Surgical History  Procedure Laterality Date  . Tonsillectomy    . Facelift  94  . Cervical disc surgery  09  . Back surgery  13   Social History:  reports that she quit smoking about 18 years ago. Her smoking use included Cigarettes. She has a 40 pack-year smoking history. She does not have any smokeless tobacco history on file. She reports that she drinks alcohol. She reports that she does not use illicit drugs. Where does patient live home. Can patient  participate in ADLs? Not sure.  Allergies  Allergen Reactions  . Other Rash    bandaides    Family History: History reviewed. No pertinent family history.    Prior to Admission medications   Medication Sig Start Date End Date Taking? Authorizing Provider  buPROPion (WELLBUTRIN SR) 200 MG 12 hr tablet Take 200 mg by mouth 2 (two) times daily. 10/06/14  Yes Historical Provider, MD  Calcium Carbonate-Vitamin D (CALTRATE 600+D PO) Take 1 tablet by mouth daily.   Yes Historical Provider, MD  diazepam (VALIUM) 5 MG tablet Take 1 tablet (5 mg total) by mouth every 6 (six) hours as needed for muscle spasms. 11/20/14  Yes Newman Pies, MD  docusate sodium 100 MG CAPS Take 100 mg by mouth 2 (two) times daily. 11/20/14  Yes Newman Pies, MD  DULoxetine (CYMBALTA) 60 MG capsule Take 60 mg by mouth daily.   Yes Historical Provider, MD  FOLIC ACID PO Take 15 mg by mouth daily.   Yes Historical Provider, MD  gabapentin (NEURONTIN) 300 MG capsule Take 600 mg by mouth 2 (two) times daily. 300 mg in am am and 600 mg in pm 09/01/14  Yes Historical Provider, MD  HYDROcodone-acetaminophen (NORCO/VICODIN) 5-325 MG per tablet Take 1-2 tablets by mouth every 4 (four) hours as needed for moderate pain. 11/20/14  Yes Newman Pies, MD  L-Methylfolate (DEPLIN) 15 MG TABS Take 1 tablet by mouth daily.   Yes  Historical Provider, MD  pantoprazole (PROTONIX) 40 MG tablet Take 40 mg by mouth daily. 10/16/14  Yes Historical Provider, MD    Physical Exam: Filed Vitals:   11/22/14 0215 11/22/14 0230 11/22/14 0245 11/22/14 0300  BP: 141/67 146/69 141/64 150/70  Pulse: 88 84 84 85  Temp:      TempSrc:      Resp:      Height:      Weight:      SpO2: 97% 99% 98% 96%     General:  Well-developed and nourished.  Eyes: Anicteric no pallor.  ENT: No discharge from the ears eyes nose mouth.  Neck: No mass felt.  Cardiovascular: S1-S2 heard.  Respiratory: No rhonchi or crepitations.  Abdomen: Soft  nontender bowel sounds present.  Skin: Surgical site looks clean.  Musculoskeletal: No edema. Back patient has low back pain on raising her legs more on the right side.  Psychiatric: Appears normal.  Neurologic: Alert awake oriented to time place and person. Moves all extremities.  Labs on Admission:  Basic Metabolic Panel:  Recent Labs Lab 11/19/14 0327 11/22/14 0015  NA 139 133*  K 4.2 3.6*  CL 102 93*  CO2 27 26  GLUCOSE 84 117*  BUN 12 12  CREATININE 0.89 0.66  CALCIUM 8.5 9.3   Liver Function Tests:  Recent Labs Lab 11/22/14 0015  AST 17  ALT 10  ALKPHOS 60  BILITOT 0.4  PROT 6.8  ALBUMIN 3.0*   No results for input(s): LIPASE, AMYLASE in the last 168 hours. No results for input(s): AMMONIA in the last 168 hours. CBC:  Recent Labs Lab 11/19/14 0327 11/22/14 0015  WBC 6.4 10.1  NEUTROABS  --  8.8*  HGB 8.2* 8.0*  HCT 25.7* 24.7*  MCV 85.7 82.9  PLT 229 200   Cardiac Enzymes: No results for input(s): CKTOTAL, CKMB, CKMBINDEX, TROPONINI in the last 168 hours.  BNP (last 3 results) No results for input(s): PROBNP in the last 8760 hours. CBG: No results for input(s): GLUCAP in the last 168 hours.  Radiological Exams on Admission: Dg Lumbar Spine Complete  11/22/2014   CLINICAL DATA:  Recent lumbar fusion with constipation. Lower back pain.  EXAM: LUMBAR SPINE - COMPLETE 4+ VIEW  COMPARISON:  11/18/2014 fluoroscopy  FINDINGS: Recent L4-5 posterior lumbar interbody fusion with rod and pedicle screw fixation. Vertebral body heights are maintained - no evidence of acute fracture. The intervertebral graft remains in good position. The rod and pedicle screws are located.  Degenerative endplate spurring throughout the lumbar spine. No endplate erosion.  Osteopenia.  IMPRESSION: L4-5 PLIF with rod and pedicle screw fixation.  No adverse findings.   Electronically Signed   By: Jorje Guild M.D.   On: 11/22/2014 00:24   Dg Abd 2 Views  11/22/2014    CLINICAL DATA:  Constipation.  EXAM: ABDOMEN - 2 VIEW  COMPARISON:  None.  FINDINGS: No abnormal bowel dilatation is noted. Status post surgical posterior fusion of lower lumbar spine. Moderate amount of stool is noted in the sigmoid colon and rectum concerning for possible constipation. No definite renal calculi are noted. Phleboliths are noted in the pelvis.  IMPRESSION: No abnormal bowel dilatation is noted. Moderate stool burden is noted in the sigmoid colon and rectum suggesting constipation.   Electronically Signed   By: Sabino Dick M.D.   On: 11/22/2014 00:24     Assessment/Plan Principal Problem:   Low back pain Active Problems:   UTI (lower urinary tract infection)  Fever   Normocytic anemia   1. Low back pain with recent surgery - at this time patient has been placed on Dilaudid for pain relief along with Valium for muscle spasm. Since patient has significant pain MRI of L-spine has been ordered. Please notify Dr. Arnoldo Morale patient's neurosurgeon in a.m. Get physical therapy consult. 2. Fever - with UA showing features of UTI fever probably from UTI. Follow urine cultures and blood cultures. Follow MRI results. For now patient is on ceftriaxone. 3. Anemia normocytic - follow CBC. 4. Constipation - patient states she has not moved her bowels for last 4 days and I have ordered soapsuds edema. Continue Colace. Patient has mild nausea follow LFTs.    Code Status: Full code.  Family Communication: Patient's husband at the bedside.  Disposition Plan: Admit to inpatient.    Seldon Barrell N. [facility] Pager [phone].  If 7PM-7AM, please contact night-coverage [email or web address] Password [email or web address] 11/22/2014, 3:50 AM

## 2014-11-22 NOTE — ED Notes (Signed)
Reported to kelly, pa that all results are back. She acknowledges.

## 2014-11-22 NOTE — Progress Notes (Signed)
Utilization review completed.  

## 2014-11-22 NOTE — Progress Notes (Signed)
PROGRESS NOTE  [name] HFW:[id number] DOB: February 23, 1941 DOA: 11/21/2014 PCP: Kirk Ruths., MD  HPI: Melody Ochoa is a 73 y.o. female who had recent lumbar surgery and was discharged 2 days ago started experiencing low back pain with minimal movement since she reached home.   Subjective / 24 H Interval events - complains of back pain and overall feeling quite poorly   Assessment/Plan: Principal Problem:   Low back pain Active Problems:   UTI (lower urinary tract infection)   Fever   Normocytic anemia   Low back pain - MRI obtained on admission and showed some postoperative fluid collection at the laminectomy site (full read below) - Neurosurgery was consulted and reviewed the images and evaluate the patient, this is expected postoperative MRI findings - Her husband will bring the brace, will obtain physical therapy consult afterwards.  Urinary tract infection - continue empiric antibiotics, she is on ceftriaxone - Urinalysis on admission grossly positive - Urine cultures pending  Constipation - resolved with enema  Anemia - 10.8 on 12/9 >> 7.7 12/18, likely multifactorial in the setting of an active infection, postoperative - check iron studies   Diet: Diet regular Fluids: NS DVT Prophylaxis: SCD  Code Status: Full Code Family Communication: none   Disposition Plan: inpatient  Consultants:  Neurosurgery   Procedures:  None    Antibiotics  Anti-infectives    Start     Dose/Rate Route Frequency Ordered Stop   11/22/14 2200  cefTRIAXone (ROCEPHIN) 1 g in dextrose 5 % 50 mL IVPB - Premix     1 g100 mL/hr over 30 Minutes Intravenous Every 24 hours 11/22/14 0347     11/22/14 0130  cefTRIAXone (ROCEPHIN) 1 g in dextrose 5 % 50 mL IVPB     1 g100 mL/hr over 30 Minutes Intravenous  Once 11/22/14 0116 11/22/14 0204       Studies  Dg Lumbar Spine Complete  11/22/2014   CLINICAL DATA:  Recent lumbar fusion with constipation. Lower back pain.   EXAM: LUMBAR SPINE - COMPLETE 4+ VIEW  COMPARISON:  11/18/2014 fluoroscopy  FINDINGS: Recent L4-5 posterior lumbar interbody fusion with rod and pedicle screw fixation. Vertebral body heights are maintained - no evidence of acute fracture. The intervertebral graft remains in good position. The rod and pedicle screws are located.  Degenerative endplate spurring throughout the lumbar spine. No endplate erosion.  Osteopenia.  IMPRESSION: L4-5 PLIF with rod and pedicle screw fixation.  No adverse findings.   Electronically Signed   By: Jorje Guild M.D.   On: 11/22/2014 00:24   Dg Abd 2 Views  11/22/2014   CLINICAL DATA:  Constipation.  EXAM: ABDOMEN - 2 VIEW  COMPARISON:  None.  FINDINGS: No abnormal bowel dilatation is noted. Status post surgical posterior fusion of lower lumbar spine. Moderate amount of stool is noted in the sigmoid colon and rectum concerning for possible constipation. No definite renal calculi are noted. Phleboliths are noted in the pelvis.  IMPRESSION: No abnormal bowel dilatation is noted. Moderate stool burden is noted in the sigmoid colon and rectum suggesting constipation.   Electronically Signed   By: Sabino Dick M.D.   On: 11/22/2014 00:24   MRI L spine 12/18  IMPRESSION: 1. Interval posterior decompression and fusion at L4-5. 4.6 cm postoperative fluid collection at the laminectomy site with additional 3.8 cm low signal focus along its anterior margin. This is nonspecific, with considerations including retracted hematoma, gas, and possibly residual osseous fragments. Further evaluation with  CT is suggested. There is resultant complete thecal sac effacement at L4-5. Fluid collection results in increased spinal canal narrowing at L3-4 as well. Superimposed infection is not excluded. 2. Severe facet arthrosis at L5-S1 with slightly increased facet joint effusion on the right.   Objective  Filed Vitals:   11/22/14 0245 11/22/14 0300 11/22/14 0400 11/22/14 1353  BP: 141/64  150/70 140/63 150/71  Pulse: 84 85 86 87  Temp:   98.9 F (37.2 C) 98.4 F (36.9 C)  TempSrc:   Oral Oral  Resp:   22 18  Height:      Weight:      SpO2: 98% 96% 96% 100%    Intake/Output Summary (Last 24 hours) at 11/22/14 1431 Last data filed at 11/22/14 1420  Gross per 24 hour  Intake   1940 ml  Output    501 ml  Net   1439 ml   Filed Weights   11/21/14 2210  Weight: 91.627 kg (202 lb)    Exam:  General:  NAD  Cardiovascular: RRR no MRG  Respiratory: clear, no wheezing  Abdomen: soft, non tender  MSK: no edema; dressing intacct on her back   Neuro: non focal, no sensory deficits lower extremities  Data Reviewed: Basic Metabolic Panel:  Recent Labs Lab 11/19/14 0327 11/22/14 0015 11/22/14 0910  NA 139 133* 134*  K 4.2 3.6* 3.2*  CL 102 93* 96  CO2 27 26 26   GLUCOSE 84 117* 100*  BUN 12 12 13   CREATININE 0.89 0.66 0.63  CALCIUM 8.5 9.3 8.4   Liver Function Tests:  Recent Labs Lab 11/22/14 0015 11/22/14 0910  AST 17 14  ALT 10 9  ALKPHOS 60 54  BILITOT 0.4 0.3  PROT 6.8 6.5  ALBUMIN 3.0* 2.9*   No results for input(s): LIPASE, AMYLASE in the last 168 hours. No results for input(s): AMMONIA in the last 168 hours. CBC:  Recent Labs Lab 11/19/14 0327 11/22/14 0015 11/22/14 0910  WBC 6.4 10.1 8.4  NEUTROABS  --  8.8* 6.7  HGB 8.2* 8.0* 7.7*  HCT 25.7* 24.7* 23.6*  MCV 85.7 82.9 83.1  PLT 229 200 204   Cardiac Enzymes: No results for input(s): CKTOTAL, CKMB, CKMBINDEX, TROPONINI in the last 168 hours. BNP (last 3 results) No results for input(s): PROBNP in the last 8760 hours. CBG: No results for input(s): GLUCAP in the last 168 hours.  Recent Results (from the past 240 hour(s))  Surgical pcr screen     Status: None   Collection Time: 11/13/14 11:25 AM  Result Value Ref Range Status   MRSA, PCR NEGATIVE NEGATIVE Final   Staphylococcus aureus NEGATIVE NEGATIVE Final    Comment:        The Xpert SA Assay (FDA approved for  NASAL specimens in patients over 53 years of age), is one component of a comprehensive surveillance program.  Test performance has been validated by [employer] for patients greater than or equal to 72 year old. It is not intended to diagnose infection nor to guide or monitor treatment.      Scheduled Meds: . buPROPion  200 mg Oral BID  . cefTRIAXone (ROCEPHIN)  IV  1 g Intravenous Q24H  . DEPLIN  1 tablet Oral Daily  . docusate sodium  100 mg Oral BID  . DULoxetine  60 mg Oral Daily  . folic acid  15 mg Oral Daily  . gabapentin  600 mg Oral BID  . pantoprazole  40 mg Oral  Daily   Continuous Infusions: . sodium chloride 75 mL/hr at 11/22/14 0355    Time spent: 35 minutes  Marzetta Board, MD [facility] Pager [phone]. If 7 PM - 7 AM, please contact night-coverage at [email or web address], password [facility] 11/22/2014, 2:31 PM  LOS: 1 day

## 2014-11-23 LAB — IRON AND TIBC
Iron: 19 ug/dL — ABNORMAL LOW (ref 42–135)
Saturation Ratios: 10 % — ABNORMAL LOW (ref 20–55)
TIBC: 184 ug/dL — ABNORMAL LOW (ref 250–470)
UIBC: 165 ug/dL (ref 125–400)

## 2014-11-23 LAB — RETICULOCYTES
RBC.: 2.62 MIL/uL — ABNORMAL LOW (ref 3.87–5.11)
Retic Count, Absolute: 55 10*3/uL (ref 19.0–186.0)
Retic Ct Pct: 2.1 % (ref 0.4–3.1)

## 2014-11-23 LAB — FOLATE: Folate: >20 ng/mL

## 2014-11-23 LAB — BASIC METABOLIC PANEL
Anion gap: 13 (ref 5–15)
BUN: 12 mg/dL (ref 6–23)
CO2: 25 mEq/L (ref 19–32)
Calcium: 8.1 mg/dL — ABNORMAL LOW (ref 8.4–10.5)
Chloride: 97 mEq/L (ref 96–112)
Creatinine, Ser: 0.66 mg/dL (ref 0.50–1.10)
GFR calc Af Amer: >90 mL/min (ref >90)
GFR calc non Af Amer: 85 mL/min — ABNORMAL LOW (ref >90)
Glucose, Bld: 77 mg/dL (ref 70–99)
Potassium: 3.3 mEq/L — ABNORMAL LOW (ref 3.7–5.3)
Sodium: 135 mEq/L — ABNORMAL LOW (ref 137–147)

## 2014-11-23 LAB — TYPE AND SCREEN
ABO/RH(D): O POS
Antibody Screen: NEGATIVE
Unit division: 0

## 2014-11-23 LAB — CBC
HCT: 22.3 % — ABNORMAL LOW (ref 36.0–46.0)
Hemoglobin: 7.4 g/dL — ABNORMAL LOW (ref 12.0–15.0)
MCH: 28.2 pg (ref 26.0–34.0)
MCHC: 33.2 g/dL (ref 30.0–36.0)
MCV: 85.1 fL (ref 78.0–100.0)
Platelets: 211 10*3/uL (ref 150–400)
RBC: 2.62 MIL/uL — ABNORMAL LOW (ref 3.87–5.11)
RDW: 14.2 % (ref 11.5–15.5)
WBC: 5.6 10*3/uL (ref 4.0–10.5)

## 2014-11-23 LAB — VITAMIN B12: Vitamin B-12: 334 pg/mL (ref 211–911)

## 2014-11-23 LAB — FERRITIN: Ferritin: 115 ng/mL (ref 10–291)

## 2014-11-23 LAB — PREPARE RBC (CROSSMATCH)

## 2014-11-23 MED ORDER — OXYCODONE HCL 5 MG PO TABS
5.0000 mg | ORAL_TABLET | ORAL | Status: DC | PRN
  Administered 2014-11-23 – 2014-11-25 (×6): 5 mg via ORAL
  Filled 2014-11-23 (×6): qty 1

## 2014-11-23 MED ORDER — SODIUM CHLORIDE 0.9 % IV SOLN
Freq: Once | INTRAVENOUS | Status: AC
  Administered 2014-11-23: 13:00:00 via INTRAVENOUS

## 2014-11-23 MED ORDER — POTASSIUM CHLORIDE CRYS ER 20 MEQ PO TBCR
40.0000 meq | EXTENDED_RELEASE_TABLET | Freq: Once | ORAL | Status: AC
  Administered 2014-11-23: 40 meq via ORAL
  Filled 2014-11-23: qty 2

## 2014-11-23 NOTE — Evaluation (Signed)
Physical Therapy Evaluation Patient Details Name: Melody Ochoa MRN: [id number] DOB: 1941/11/01 Today's Date: 11/23/2014   History of Present Illness  pt presents with back pain and UTI after recent L4-5 Fusion with hx of Lumbar Lami and a Neck surgery.    Clinical Impression  Pt mobility primarily limited by pain.  Pt would benefit from continued therapy and education prior to returning to home with husband.  Discussed option of D/C to SNF for further rehab and pt and husband are both in agreement.  Will continue to follow while on acute.      Follow Up Recommendations SNF    Equipment Recommendations  None recommended by PT    Recommendations for Other Services       Precautions / Restrictions Precautions Precautions: Back;Fall Precaution Booklet Issued: No Precaution Comments: pt able to recall back precautions from previous admit.   Required Braces or Orthoses: Spinal Brace Spinal Brace: Applied in sitting position;Lumbar corset Restrictions Weight Bearing Restrictions: No      Mobility  Bed Mobility Overal bed mobility: Needs Assistance Bed Mobility: Rolling;Sidelying to Sit Rolling: Supervision Sidelying to sit: Min assist       General bed mobility comments: A with bringing trunk up to sitting position.  cues for not twisting as pt tends to reach back with UE 2/2 pain.    Transfers Overall transfer level: Needs assistance Equipment used: Rolling walker (2 wheeled) Transfers: Sit to/from Stand Sit to Stand: Mod assist         General transfer comment: cues for UE use aspt tends to grab at [facility] and staff for lifting A.    Ambulation/Gait Ambulation/Gait assistance: Min assist Ambulation Distance (Feet): 10 Feet (and 18) Assistive device: Rolling walker (2 wheeled) Gait Pattern/deviations: Step-through pattern;Decreased stride length;Shuffle     General Gait Details: pt moves very slowly and shuffles.  cues for positioning within RW and management of  RW over threshold of bathroom door.    Stairs            Wheelchair Mobility    Modified Rankin (Stroke Patients Only)       Balance Overall balance assessment: Needs assistance Sitting-balance support: Bilateral upper extremity supported;Feet supported Sitting balance-Leahy Scale: Poor Sitting balance - Comments: pt uses UEs to minimize pain in back.     Standing balance support: Bilateral upper extremity supported;During functional activity Standing balance-Leahy Scale: Poor                               Pertinent Vitals/Pain Pain Assessment: 0-10 Pain Score: 8  Pain Location: Back Pain Descriptors / Indicators: Aching Pain Intervention(s): Premedicated before session;Repositioned    Home Living Family/patient expects to be discharged to:: Private residence Living Arrangements: Spouse/significant other Available Help at Discharge: Family;Available 24 hours/day Type of Home: House Home Access: Stairs to enter Entrance Stairs-Rails: None Entrance Stairs-Number of Steps: 2 Home Layout: One level Home Equipment: Walker - 2 wheels;Cane - quad;Toilet riser;Adaptive equipment      Prior Function Level of Independence: Independent         Comments: Independent prior to back surgery on 11/18/14.       Hand Dominance        Extremity/Trunk Assessment   Upper Extremity Assessment: Generalized weakness           Lower Extremity Assessment: Generalized weakness      Cervical / Trunk Assessment: Normal  Communication   Communication:  No difficulties  Cognition Arousal/Alertness: Awake/alert Behavior During Therapy: WFL for tasks assessed/performed Overall Cognitive Status: Impaired/Different from baseline Area of Impairment: Safety/judgement;Memory     Memory: Decreased short-term memory   Safety/Judgement: Decreased awareness of safety          General Comments      Exercises        Assessment/Plan    PT Assessment  Patient needs continued PT services  PT Diagnosis Difficulty walking;Acute pain   PT Problem List Decreased strength;Decreased activity tolerance;Decreased balance;Decreased mobility;Decreased knowledge of use of DME;Decreased cognition;Pain;Decreased knowledge of precautions  PT Treatment Interventions DME instruction;Gait training;Stair training;Functional mobility training;Therapeutic activities;Therapeutic exercise;Balance training;Neuromuscular re-education;Patient/family education   PT Goals (Current goals can be found in the Care Plan section) Acute Rehab PT Goals Patient Stated Goal: to move normal again.   PT Goal Formulation: With patient Time For Goal Achievement: 11/30/14 Potential to Achieve Goals: Good    Frequency Min 5X/week   Barriers to discharge        Co-evaluation               End of Session Equipment Utilized During Treatment: Gait belt;Back brace Activity Tolerance: Patient limited by pain Patient left: in chair;with call bell/phone within reach;with family/visitor present Nurse Communication: Mobility status         Time: 4742-5956 PT Time Calculation (min) (ACUTE ONLY): 49 min   Charges:   PT Evaluation $Initial PT Evaluation Tier I: 1 Procedure PT Treatments $Gait Training: 8-22 mins $Therapeutic Activity: 23-37 mins   PT G CodesCatarina Hartshorn, [place] 11/23/2014, 2:04 PM

## 2014-11-23 NOTE — Progress Notes (Signed)
PROGRESS NOTE  TAYNA SMETHURST WUX:[id number] DOB: 12-Sep-1941 DOA: 11/21/2014 PCP: Kirk Ruths., MD  HPI: Melody Ochoa is a 73 y.o. female who had recent lumbar surgery and was discharged 2 days ago started experiencing low back pain with minimal movement since she reached home.   Subjective / 24 H Interval events - She is feeling a little bit better today   Assessment/Plan: Principal Problem:   Low back pain Active Problems:   UTI (lower urinary tract infection)   Fever   Normocytic anemia   Low back pain - MRI obtained on admission and showed some postoperative fluid collection at the laminectomy site (full read below) - Neurosurgery was consulted and reviewed the images and evaluate the patient, this is expected postoperative MRI findings - We will obtain physical therapy evaluation today  Urinary tract infection - continue empiric antibiotics, she is on ceftriaxone - Urinalysis on admission grossly positive - Urine cultures pending  Constipation - resolved with enema  Anemia - 10.8 on 12/9 >> 7.7 12/18, likely multifactorial in the setting of an active infection, postoperative - Iron studies pending   Diet: Diet regular Fluids: NS DVT Prophylaxis: SCD  Code Status: Full Code Family Communication: none   Disposition Plan: inpatient  Consultants:  Neurosurgery   Procedures:  None    Antibiotics  Anti-infectives    Start     Dose/Rate Route Frequency Ordered Stop   11/22/14 2200  cefTRIAXone (ROCEPHIN) 1 g in dextrose 5 % 50 mL IVPB - Premix     1 g100 mL/hr over 30 Minutes Intravenous Every 24 hours 11/22/14 0347     11/22/14 0130  cefTRIAXone (ROCEPHIN) 1 g in dextrose 5 % 50 mL IVPB     1 g100 mL/hr over 30 Minutes Intravenous  Once 11/22/14 0116 11/22/14 0204       Studies  Mr Lumbar Spine Wo Contrast  11/22/2014   CLINICAL DATA:  Severe low back pain. Fever and incontinence. Surgery lumbar surgery 11/18/2014.  EXAM: MRI LUMBAR  SPINE WITHOUT CONTRAST  TECHNIQUE: Multiplanar, multisequence MR imaging of the lumbar spine was performed. No intravenous contrast was administered.  COMPARISON:  09/16/2014  FINDINGS: The examination had to be discontinued prior to completion due to patient pain and inability to tolerate further imaging. Sagittal T1, T2, and STIR and axial T2 weighted images were obtained.  Vertebral alignment is unchanged without significant listhesis. Vertebral body heights are preserved. Sequelae of interval L4-5 PLIF are identified. There is diffuse lumbar disc desiccation. Multiple small Schmorl's nodes are again seen. Conus medullaris is normal in signal and terminates at L1. Small T2 hyperintense renal lesions are similar to the prior study and may represent cysts. 2.2 cm left adrenal nodule is unchanged.  T10-11 and T11-12: Only imaged sagittally. Mild disc bulging without evidence of significant spinal canal stenosis. Mild right neural foraminal narrowing at T10-11.  T12-L1:  Mild disc bulging without stenosis, unchanged.  L1-2:  Mild disc bulging without stenosis, unchanged.  L2-3: Mild disc bulging and mild facet hypertrophy without stenosis, unchanged.  L3-4: Interval bilateral laminotomies. Unchanged circumferential disc bulging and facet hypertrophy contributing to spinal canal and lateral recess narrowing as well as mild-to-moderate left neural foraminal stenosis. Superior extension of postoperative fluid collection at L4-5 contributes to increased spinal stenosis at and just below the disc space level, further described below.  L4-5: Interval wide posterior decompression and posterior and interbody fusion. Fluid collection at the laminectomy site measures approximately 2.4 x 2.5 x 4.6  cm (transverse x AP x craniocaudal). There is a 3.0 x 1.3 x 1.5 cm oblong markedly T2 hypointense focus at the anterior aspect of the fluid collection with complete thecal sac effacement at this level. Neural foramina are not well  evaluated due to artifact and postsurgical changes. Postoperative fluid collection in the superficial soft tissues at the incision measures 3.8 x 1.5 x 8.6 cm. There is edema diffusely within the posterior paraspinal soft tissues.  L5-S1: Severe right and moderate left facet arthrosis with slightly increased size of small right facet joint effusion. No definite progressive marrow edema involving the facets. Disc bulging and facet disease results in moderate right and mild left neural foraminal stenosis without spinal stenosis, unchanged.  IMPRESSION: 1. Interval posterior decompression and fusion at L4-5. 4.6 cm postoperative fluid collection at the laminectomy site with additional 3.8 cm low signal focus along its anterior margin. This is nonspecific, with considerations including retracted hematoma, gas, and possibly residual osseous fragments. Further evaluation with CT is suggested. There is resultant complete thecal sac effacement at L4-5. Fluid collection results in increased spinal canal narrowing at L3-4 as well. Superimposed infection is not excluded. 2. Severe facet arthrosis at L5-S1 with slightly increased facet joint effusion on the right.   Electronically Signed   By: Logan Bores   On: 11/22/2014 07:35   MRI L spine 12/18  IMPRESSION: 1. Interval posterior decompression and fusion at L4-5. 4.6 cm postoperative fluid collection at the laminectomy site with additional 3.8 cm low signal focus along its anterior margin. This is nonspecific, with considerations including retracted hematoma, gas, and possibly residual osseous fragments. Further evaluation with CT is suggested. There is resultant complete thecal sac effacement at L4-5. Fluid collection results in increased spinal canal narrowing at L3-4 as well. Superimposed infection is not excluded. 2. Severe facet arthrosis at L5-S1 with slightly increased facet joint effusion on the right.   Objective  Filed Vitals:   11/22/14 0400 11/22/14 1353  11/22/14 2302 11/23/14 0503  BP: 140/63 150/71 135/68 143/68  Pulse: 86 87 84 84  Temp: 98.9 F (37.2 C) 98.4 F (36.9 C) 99 F (37.2 C) 98.4 F (36.9 C)  TempSrc: Oral Oral Oral Oral  Resp: 22 18 16 18   Height:      Weight:      SpO2: 96% 100% 100% 99%    Intake/Output Summary (Last 24 hours) at 11/23/14 0723 Last data filed at 11/23/14 0502  Gross per 24 hour  Intake   1475 ml  Output    551 ml  Net    924 ml   Filed Weights   11/21/14 2210  Weight: 91.627 kg (202 lb)    Exam:  General:  NAD  Cardiovascular: RRR no MRG  Respiratory: clear, no wheezing  Abdomen: soft, non tender  MSK: no edema; dressing intacct on her back   Neuro: non focal, no sensory deficits lower extremities  Data Reviewed: Basic Metabolic Panel:  Recent Labs Lab 11/19/14 0327 11/22/14 0015 11/22/14 0910 11/23/14 0415  NA 139 133* 134* 135*  K 4.2 3.6* 3.2* 3.3*  CL 102 93* 96 97  CO2 27 26 26 25   GLUCOSE 84 117* 100* 77  BUN 12 12 13 12   CREATININE 0.89 0.66 0.63 0.66  CALCIUM 8.5 9.3 8.4 8.1*   Liver Function Tests:  Recent Labs Lab 11/22/14 0015 11/22/14 0910  AST 17 14  ALT 10 9  ALKPHOS 60 54  BILITOT 0.4 0.3  PROT 6.8 6.5  ALBUMIN 3.0* 2.9*   No results for input(s): LIPASE, AMYLASE in the last 168 hours. No results for input(s): AMMONIA in the last 168 hours. CBC:  Recent Labs Lab 11/19/14 0327 11/22/14 0015 11/22/14 0910 11/23/14 0415  WBC 6.4 10.1 8.4 5.6  NEUTROABS  --  8.8* 6.7  --   HGB 8.2* 8.0* 7.7* 7.4*  HCT 25.7* 24.7* 23.6* 22.3*  MCV 85.7 82.9 83.1 85.1  PLT 229 200 204 211   Cardiac Enzymes: No results for input(s): CKTOTAL, CKMB, CKMBINDEX, TROPONINI in the last 168 hours. BNP (last 3 results) No results for input(s): PROBNP in the last 8760 hours. CBG: No results for input(s): GLUCAP in the last 168 hours.  Recent Results (from the past 240 hour(s))  Surgical pcr screen     Status: None   Collection Time: 11/13/14 11:25 AM    Result Value Ref Range Status   MRSA, PCR NEGATIVE NEGATIVE Final   Staphylococcus aureus NEGATIVE NEGATIVE Final    Comment:        The Xpert SA Assay (FDA approved for NASAL specimens in patients over 30 years of age), is one component of a comprehensive surveillance program.  Test performance has been validated by [employer] for patients greater than or equal to 58 year old. It is not intended to diagnose infection nor to guide or monitor treatment.   Urine culture     Status: None (Preliminary result)   Collection Time: 11/22/14 12:33 AM  Result Value Ref Range Status   Specimen Description URINE, CATHETERIZED  Final   Special Requests CX ADDED AT 0139 ON [id number]  Final   Culture  Setup Time   Final    11/22/2014 05:03 Performed at [place] [facility]   Final    >=100,000 COLONIES/ML Performed at [place] Performed at [facility]    Report Status PENDING  Incomplete     Scheduled Meds: . buPROPion  200 mg Oral BID  . cefTRIAXone (ROCEPHIN)  IV  1 g Intravenous Q24H  . DEPLIN  1 tablet Oral Daily  . docusate sodium  100 mg Oral BID  . DULoxetine  60 mg Oral Daily  . folic acid  15 mg Oral Daily  . gabapentin  600 mg Oral BID  . pantoprazole  40 mg Oral Daily   Continuous Infusions:    Time spent: 15 minutes  Marzetta Board, MD [facility] Pager [phone]. If 7 PM - 7 AM, please contact night-coverage at [email or web address], password [facility] 11/23/2014, 7:23 AM  LOS: 2 days

## 2014-11-24 LAB — URINE CULTURE: Colony Count: >=100000

## 2014-11-24 LAB — HEMOGLOBIN AND HEMATOCRIT, BLOOD
HCT: 26.2 % — ABNORMAL LOW (ref 36.0–46.0)
Hemoglobin: 8.5 g/dL — ABNORMAL LOW (ref 12.0–15.0)

## 2014-11-24 LAB — TYPE AND SCREEN
ABO/RH(D): O POS
Antibody Screen: NEGATIVE
Unit division: 0

## 2014-11-24 LAB — BASIC METABOLIC PANEL
Anion gap: 13 (ref 5–15)
BUN: 8 mg/dL (ref 6–23)
CO2: 25 mEq/L (ref 19–32)
Calcium: 8.8 mg/dL (ref 8.4–10.5)
Chloride: 96 mEq/L (ref 96–112)
Creatinine, Ser: 0.69 mg/dL (ref 0.50–1.10)
GFR calc Af Amer: >90 mL/min (ref >90)
GFR calc non Af Amer: 84 mL/min — ABNORMAL LOW (ref >90)
Glucose, Bld: 83 mg/dL (ref 70–99)
Potassium: 3.8 mEq/L (ref 3.7–5.3)
Sodium: 134 mEq/L — ABNORMAL LOW (ref 137–147)

## 2014-11-24 LAB — CBC
HCT: 26.2 % — ABNORMAL LOW (ref 36.0–46.0)
Hemoglobin: 8.6 g/dL — ABNORMAL LOW (ref 12.0–15.0)
MCH: 27.5 pg (ref 26.0–34.0)
MCHC: 32.8 g/dL (ref 30.0–36.0)
MCV: 83.7 fL (ref 78.0–100.0)
Platelets: 228 10*3/uL (ref 150–400)
RBC: 3.13 MIL/uL — ABNORMAL LOW (ref 3.87–5.11)
RDW: 14 % (ref 11.5–15.5)
WBC: 4.8 10*3/uL (ref 4.0–10.5)

## 2014-11-24 MED ORDER — FERROUS SULFATE 325 (65 FE) MG PO TABS
325.0000 mg | ORAL_TABLET | Freq: Three times a day (TID) | ORAL | Status: DC
  Administered 2014-11-24 – 2014-11-25 (×3): 325 mg via ORAL
  Filled 2014-11-24 (×5): qty 1

## 2014-11-24 MED ORDER — CIPROFLOXACIN HCL 500 MG PO TABS
500.0000 mg | ORAL_TABLET | Freq: Two times a day (BID) | ORAL | Status: DC
  Administered 2014-11-24 – 2014-11-25 (×2): 500 mg via ORAL
  Filled 2014-11-24 (×4): qty 1

## 2014-11-24 NOTE — Clinical Social Work Placement (Signed)
     Clinical Social Work Department CLINICAL SOCIAL WORK PLACEMENT NOTE 11/24/2014  Patient:  Melody Ochoa, Melody Ochoa  Account Number:  [id number] Admit date:  11/21/2014  Clinical Social Worker:  Adair Laundry  Date/time:  11/24/2014 02:32 PM  Clinical Social Work is seeking post-discharge placement for this patient at the following level of care:   SKILLED NURSING   (*CSW will update this form in Epic as items are completed)   11/24/2014  Patient/family provided with [facility] Department of Clinical Social Works list of facilities offering this level of care within the geographic area requested by the patient (or if unable, by the patients family).  11/24/2014  Patient/family informed of their freedom to choose among providers that offer the needed level of care, that participate in Medicare, Medicaid or managed care program needed by the patient, have an available bed and are willing to accept the patient.  11/24/2014  Patient/family informed of [facility] ownership interest in [facility], as well as of the fact that they are under no obligation to receive care at this facility.  PASARR submitted to [employer] on 11/24/2014 PASARR number received on 11/24/2014  FL2 transmitted to all facilities in geographic area requested by pt/family on  11/24/2014 FL2 transmitted to all facilities within larger geographic area on   Patient informed that his/her managed care company has contracts with or will negotiate with  certain facilities, including the following:     Patient/family informed of bed offers received:   Patient chooses bed at  Physician recommends and patient chooses bed at    Patient to be transferred to  on   Patient to be transferred to facility by  Patient and family notified of transfer on  Name of family member notified:    The following physician request were entered in Epic: Physician Request  Please sign FL2.    Additional  CommentsAdair Laundry Weekend [place]

## 2014-11-24 NOTE — Progress Notes (Signed)
PROGRESS NOTE  Melody Ochoa CXK:[id number] DOB: Jul 16, 1941 DOA: 11/21/2014 PCP: Kirk Ruths., MD  HPI: Melody Ochoa is a 73 y.o. female who had recent lumbar surgery and was discharged 2 days ago started experiencing low back pain with minimal movement since she reached home.   Subjective / 24 H Interval events - much better today, anxious about rehab d/c   Assessment/Plan: Principal Problem:   Low back pain Active Problems:   UTI (lower urinary tract infection)   Fever   Normocytic anemia   Low back pain - MRI obtained on admission and showed some postoperative fluid collection at the laminectomy site (full read below) - Neurosurgery was consulted and reviewed the images and evaluate the patient, this is expected postoperative MRI findings - PR recommended SNF, patient agreeable, consult SW  Urinary tract infection - continue empiric antibiotics, she is on ceftriaxone - Urinalysis on admission grossly positive - Urine cultures with pseudomonas sensitive to Cipro, change antibiotics today  Constipation - resolved with enema  Anemia - 10.8 on 12/9 >> 7.7 12/18, likely multifactorial in the setting of an active infection, postoperative - iron deficient, start supplements   Diet: Diet regular Fluids: NS DVT Prophylaxis: SCD  Code Status: Full Code Family Communication: none   Disposition Plan: inpatient  Consultants:  Neurosurgery   Procedures:  None    Antibiotics Ceftriaxone 12/18 >>12/20 Ciprofloxacin 12/20 >>   Studies  No results found. MRI L spine 12/18  IMPRESSION: 1. Interval posterior decompression and fusion at L4-5. 4.6 cm postoperative fluid collection at the laminectomy site with additional 3.8 cm low signal focus along its anterior margin. This is nonspecific, with considerations including retracted hematoma, gas, and possibly residual osseous fragments. Further evaluation with CT is suggested. There is resultant complete  thecal sac effacement at L4-5. Fluid collection results in increased spinal canal narrowing at L3-4 as well. Superimposed infection is not excluded. 2. Severe facet arthrosis at L5-S1 with slightly increased facet joint effusion on the right.   Objective  Filed Vitals:   11/23/14 2040 11/23/14 2112 11/23/14 2359 11/24/14 0529  BP: 131/64 143/76 141/59 159/72  Pulse: 84 82 80 73  Temp: 98 F (36.7 C) 97.8 F (36.6 C) 98.3 F (36.8 C) 98.3 F (36.8 C)  TempSrc: Oral Oral Oral Oral  Resp: 15 16 15 20   Height:      Weight:      SpO2: 100% 100% 98% 100%    Intake/Output Summary (Last 24 hours) at 11/24/14 1252 Last data filed at 11/24/14 1205  Gross per 24 hour  Intake    985 ml  Output      0 ml  Net    985 ml   Filed Weights   11/21/14 2210  Weight: 91.627 kg (202 lb)   Exam:  General:  NAD  Cardiovascular: RRR no MRG  Respiratory: clear, no wheezing  Abdomen: soft, non tender  MSK: no edema; dressing intacct on her back   Neuro: non focal, no sensory deficits lower extremities  Data Reviewed: Basic Metabolic Panel:  Recent Labs Lab 11/19/14 0327 11/22/14 0015 11/22/14 0910 11/23/14 0415 11/24/14 0549  NA 139 133* 134* 135* 134*  K 4.2 3.6* 3.2* 3.3* 3.8  CL 102 93* 96 97 96  CO2 27 26 26 25 25   GLUCOSE 84 117* 100* 77 83  BUN 12 12 13 12 8   CREATININE 0.89 0.66 0.63 0.66 0.69  CALCIUM 8.5 9.3 8.4 8.1* 8.8   Liver Function  Tests:  Recent Labs Lab 11/22/14 0015 11/22/14 0910  AST 17 14  ALT 10 9  ALKPHOS 60 54  BILITOT 0.4 0.3  PROT 6.8 6.5  ALBUMIN 3.0* 2.9*   CBC:  Recent Labs Lab 11/19/14 0327 11/22/14 0015 11/22/14 0910 11/23/14 0415 11/24/14 0549  WBC 6.4 10.1 8.4 5.6 4.8  NEUTROABS  --  8.8* 6.7  --   --   HGB 8.2* 8.0* 7.7* 7.4* 8.6*  8.5*  HCT 25.7* 24.7* 23.6* 22.3* 26.2*  26.2*  MCV 85.7 82.9 83.1 85.1 83.7  PLT 229 200 204 211 228    Recent Results (from the past 240 hour(s))  Urine culture     Status: None    Collection Time: 11/22/14 12:33 AM  Result Value Ref Range Status   Specimen Description URINE, CATHETERIZED  Final   Special Requests CX ADDED AT 0139 ON [id number]  Final   Culture  Setup Time   Final    11/22/2014 05:03 Performed at [place]   Final    >=100,000 COLONIES/ML Performed at [facility]    Culture   Final    PSEUDOMONAS AERUGINOSA Performed at [facility]    Report Status 11/24/2014 FINAL  Final   Organism ID, Bacteria PSEUDOMONAS AERUGINOSA  Final      Susceptibility   Pseudomonas aeruginosa - MIC*    CEFEPIME 2 SENSITIVE Sensitive     CEFTAZIDIME 4 SENSITIVE Sensitive     CIPROFLOXACIN <=0.25 SENSITIVE Sensitive     GENTAMICIN <=1 SENSITIVE Sensitive     IMIPENEM 2 SENSITIVE Sensitive     PIP/TAZO 8 SENSITIVE Sensitive     TOBRAMYCIN <=1 SENSITIVE Sensitive     * PSEUDOMONAS AERUGINOSA  Culture, blood (routine x 2)     Status: None (Preliminary result)   Collection Time: 11/22/14  3:05 AM  Result Value Ref Range Status   Specimen Description BLOOD LEFT ARM  Final   Special Requests BOTTLES DRAWN AEROBIC AND ANAEROBIC 10CC  Final   Culture  Setup Time   Final    11/22/2014 11:03 Performed at [facility]    Culture   Final           BLOOD CULTURE RECEIVED NO GROWTH TO DATE CULTURE WILL BE HELD FOR 5 DAYS BEFORE ISSUING A FINAL NEGATIVE REPORT Performed at [facility]    Report Status PENDING  Incomplete  Culture, blood (routine x 2)     Status: None (Preliminary result)   Collection Time: 11/22/14  3:18 AM  Result Value Ref Range Status   Specimen Description BLOOD RIGHT HAND  Final   Special Requests BOTTLES DRAWN AEROBIC ONLY 10CC  Final   Culture  Setup Time   Final    11/22/2014 11:03 Performed at [facility]    Culture   Final           BLOOD CULTURE RECEIVED NO GROWTH TO DATE CULTURE WILL BE HELD FOR 5 DAYS BEFORE ISSUING A FINAL NEGATIVE REPORT Performed at [facility]    Report Status PENDING  Incomplete     Scheduled Meds: . buPROPion  200 mg Oral BID  . cefTRIAXone (ROCEPHIN)  IV  1 g Intravenous Q24H  . DEPLIN  1 tablet Oral Daily  . docusate sodium  100 mg Oral BID  . DULoxetine  60 mg Oral Daily  . gabapentin  600 mg Oral BID  . pantoprazole  40 mg  Oral Daily   Continuous Infusions:    Time spent: 15 minutes  Marzetta Board, MD [facility] Pager [phone]. If 7 PM - 7 AM, please contact night-coverage at [email or web address], password [facility] 11/24/2014, 12:52 PM  LOS: 3 days

## 2014-11-24 NOTE — Plan of Care (Signed)
Problem: Phase II Progression Outcomes Goal: Progress activity as tolerated unless otherwise ordered Outcome: Progressing Up to bedside commode with assist

## 2014-11-24 NOTE — Clinical Social Work Psychosocial (Signed)
     Clinical Social Work Department BRIEF PSYCHOSOCIAL ASSESSMENT 11/24/2014  Patient:  Melody Ochoa, Melody Ochoa     Account Number:  [id number]     Admit date:  11/21/2014  Clinical Social Worker:  Adair Laundry  Date/Time:  11/24/2014 12:00 N  Referred by:  Physician  Date Referred:  11/24/2014 Referred for  SNF Placement   Other Referral:   Interview type:  Patient Other interview type:   Spoke with pt and pt husband at bedside    PSYCHOSOCIAL DATA Living Status:  HUSBAND Admitted from facility:   Level of care:   Primary support name:  Verdie Drown Primary support relationship to patient:  SPOUSE Degree of support available:   Pt has good support    CURRENT CONCERNS Current Concerns  Post-Acute Placement   Other Concerns:    SOCIAL WORK ASSESSMENT / PLAN CSW visited pt room and spoke with pt and pt husband about SNF recommendation. Pt and pt husband informed CSW no one had discussed this in detail but there were aware of need for ST rehab. CSW explained SNF referral process. Pt agreeable to referral being sent to all [facility]. Pt expressed no facility preferences at this time. Pt did have concerns about how long she will be at facility. CSW explained that SNF would evaluate this on admission but pt could ask PT here if they could give her an idea. Pt also wanting to know if her dog will be able to visit. CSW informefd pt that most facilities do allow this but will need to check with facility of choice to confirm.   Assessment/plan status:  Psychosocial Support/Ongoing Assessment of Needs Other assessment/ plan:   Information/referral to community resources:   SNF list to be provided with bed offers    PATIENTS/FAMILYS RESPONSE TO PLAN OF CARE: Pt and pt husband agreeable to SNF at dc.    Thank you  Adair Laundry  Weekend [place]  [phone]

## 2014-11-25 ENCOUNTER — Encounter: Payer: Self-pay | Admitting: Internal Medicine

## 2014-11-25 LAB — HEMOGLOBIN AND HEMATOCRIT, BLOOD
HCT: 25.3 % — ABNORMAL LOW (ref 36.0–46.0)
Hemoglobin: 8.3 g/dL — ABNORMAL LOW (ref 12.0–15.0)

## 2014-11-25 MED ORDER — OXYCODONE HCL 5 MG PO TABS: 5.0000 mg | ORAL_TABLET | ORAL | Status: DC | PRN

## 2014-11-25 MED ORDER — CIPROFLOXACIN HCL 500 MG PO TABS: 500.0000 mg | ORAL_TABLET | Freq: Two times a day (BID) | ORAL | Status: DC

## 2014-11-25 MED ORDER — FERROUS SULFATE 325 (65 FE) MG PO TABS: 325.0000 mg | ORAL_TABLET | Freq: Three times a day (TID) | ORAL | Status: DC

## 2014-11-25 MED ORDER — DIAZEPAM 5 MG PO TABS: 5.0000 mg | ORAL_TABLET | Freq: Four times a day (QID) | ORAL | Status: DC | PRN

## 2014-11-25 NOTE — Progress Notes (Signed)
Subjective: Patient reports "I feel a whole lot better than I did the last time I saw you.  Just my hips now, and thats ok"  Objective: Vital signs in last 24 hours: Temp:  [98.3 F (36.8 C)-99.1 F (37.3 C)] 98.4 F (36.9 C) (12/21 0708) Pulse Rate:  [79-86] 79 (12/21 0708) Resp:  [16-20] 20 (12/21 0708) BP: (145-159)/(64-83) 159/64 mmHg (12/21 0708) SpO2:  [94 %-100 %] 99 % (12/21 0708)  Intake/Output from previous day: 12/20 0701 - 12/21 0700 In: 1200 [P.O.:1200] Out: 300 [Urine:300] Intake/Output this shift:    Alert, conversant, smiling. Good strength BLE. Incision healing nicely. Cipro in use for UTI. Pt reports feeling much better overall with decreased lumbar pain.   Lab Results:  Recent Labs  11/23/14 0415 11/24/14 0549  WBC 5.6 4.8  HGB 7.4* 8.6*  8.5*  HCT 22.3* 26.2*  26.2*  PLT 211 228   BMET  Recent Labs  11/23/14 0415 11/24/14 0549  NA 135* 134*  K 3.3* 3.8  CL 97 96  CO2 25 25  GLUCOSE 77 83  BUN 12 8  CREATININE 0.66 0.69  CALCIUM 8.1* 8.8    Studies/Results: No results found.  Assessment/Plan: Improving   LOS: 4 days  SNF placement in progress. Pt aware of plan and agreeable. Pt should follow up with Dr. Arnoldo Morale in office as previously planned (3-4 weeks post-op).   Verdis Prime 11/25/2014, 7:58 AM

## 2014-11-25 NOTE — Discharge Summary (Signed)
Physician Discharge Summary  Melody Ochoa QAS:[id number] DOB: 09-Feb-1941 DOA: 11/21/2014  PCP: Kirk Ruths., MD  Admit date: 11/21/2014 Discharge date: 11/25/2014  Time spent: 45 minutes  Recommendations for Outpatient Follow-up:  1. Follow up with PCP in 1-2 weeks 2. Follow up with Dr. Arnoldo Morale in 2 weeks  3. Continue Ciprofloxacin for 5 more days   Discharge Diagnoses:  Principal Problem:   Low back pain Active Problems:   UTI (lower urinary tract infection)   Fever   Normocytic anemia  Discharge Condition: stable  Diet recommendation: regular  Filed Weights   11/21/14 2210  Weight: 91.627 kg (202 lb)   History of present illness:  Melody Ochoa is a 73 y.o. female who had recent lumbar surgery and was discharged 2 days ago started experiencing low back pain with minimal movement since she reached home. Denies any incontinence of urine are bowels. Patient also had subjective feeling of fever or chills. Patient has been having some nausea but denies any vomiting abdominal pain or diarrhea. Patient has not moved her bowels for last 4 days. In the ER patient was found to be having fever with UA showing Features consistent with UTI. Patient did not have significant pain on moving her lower ex 20 periphery of the right lower extremity. Patient has been admitted for further management of her low back pain and fever. Blood cultures have been obtained and patient has been started on ceftriaxone. On call neurosurgeon Dr. Vertell Limber was modified by the ER physician.   Hospital Course:  Low back pain - MRI obtained on admission and showed some postoperative fluid collection at the laminectomy site (full read below) - Neurosurgery was consulted and reviewed the images and evaluate the patient, this is expected postoperative MRI findings and for now to continue brace, PT, pain control and will follow up with Dr. Arnoldo Morale in 2 weeks as an outpatient.  Urinary tract infection -  continue empiric antibiotics, she is on ceftriaxone - Urinalysis on admission grossly positive - Urine cultures with pseudomonas sensitive to Cipro, change antibiotics to Cipro, continue for 5 additional days  Constipation - resolved with enema Anemia - 10.8 on 12/9 >> 7.7 12/18, likely multifactorial in the setting of an active infection, postoperative - iron deficient, start supplements - she has received 1 unit of packed red blood cells while hospitalized, hemoglobin has remained stable.  - repeat CBC in 1 week as an outpatient  Procedures:  None    Consultations:  Neurosurgery   Discharge Exam: Filed Vitals:   11/24/14 0529 11/24/14 1422 11/24/14 2324 11/25/14 0708  BP: 159/72 155/83 145/71 159/64  Pulse: 73 86 84 79  Temp: 98.3 F (36.8 C) 98.3 F (36.8 C) 99.1 F (37.3 C) 98.4 F (36.9 C)  TempSrc: Oral Oral Oral Oral  Resp: 20 16 20 20   Height:      Weight:      SpO2: 100% 100% 94% 99%   General: NAD Cardiovascular: RRR Respiratory: CTA biL  Discharge Instructions     Medication List    STOP taking these medications        HYDROcodone-acetaminophen 5-325 MG per tablet  Commonly known as:  NORCO/VICODIN      TAKE these medications        buPROPion 200 MG 12 hr tablet  Commonly known as:  WELLBUTRIN SR  Take 200 mg by mouth 2 (two) times daily.     CALTRATE 600+D PO  Take 1 tablet by mouth daily.  ciprofloxacin 500 MG tablet  Commonly known as:  CIPRO  Take 1 tablet (500 mg total) by mouth 2 (two) times daily.     DEPLIN 15 MG Tabs  Take 1 tablet by mouth daily.     diazepam 5 MG tablet  Commonly known as:  VALIUM  Take 1 tablet (5 mg total) by mouth every 6 (six) hours as needed for muscle spasms.     DSS 100 MG Caps  Take 100 mg by mouth 2 (two) times daily.     DULoxetine 60 MG capsule  Commonly known as:  CYMBALTA  Take 60 mg by mouth daily.     ferrous sulfate 325 (65 FE) MG tablet  Take 1 tablet (325 mg total) by mouth 3  (three) times daily with meals.     gabapentin 300 MG capsule  Commonly known as:  NEURONTIN  Take 600 mg by mouth 2 (two) times daily. 300 mg in am am and 600 mg in pm     oxyCODONE 5 MG immediate release tablet  Commonly known as:  Oxy IR/ROXICODONE  Take 1 tablet (5 mg total) by mouth every 4 (four) hours as needed for severe pain.     pantoprazole 40 MG tablet  Commonly known as:  PROTONIX  Take 40 mg by mouth daily.           Follow-up Information    Follow up with Kirk Ruths., MD. Schedule an appointment as soon as possible for a visit in 2 weeks.   Specialty:  Internal Medicine   Why:  Or obtain a New PCP   Contact information:   [place] [address] [phone]       Follow up with Ophelia Charter, MD. Schedule an appointment as soon as possible for a visit in 2 weeks.   Specialty:  Neurosurgery   Contact information:   [address] [place]  [address] [phone]       The results of significant diagnostics from this hospitalization (including imaging, microbiology, ancillary and laboratory) are listed below for reference.    Significant Diagnostic Studies: Dg Lumbar Spine 2-3 Views  11/18/2014   CLINICAL DATA:  L4-5 PLIF  EXAM: LUMBAR SPINE - 2-3 VIEW; DG C-ARM 61-120 MIN  COMPARISON:  None.  FINDINGS: Two intraoperative fluoroscopic spot images of the lumbar spine are provided. Interval PLIF at L4-5.  IMPRESSION: PLIF at L4-5.   Electronically Signed   By: Kathreen Devoid   On: 11/18/2014 16:36   Dg Lumbar Spine Complete  11/22/2014   CLINICAL DATA:  Recent lumbar fusion with constipation. Lower back pain.  EXAM: LUMBAR SPINE - COMPLETE 4+ VIEW  COMPARISON:  11/18/2014 fluoroscopy  FINDINGS: Recent L4-5 posterior lumbar interbody fusion with rod and pedicle screw fixation. Vertebral body heights are maintained - no evidence of acute fracture. The intervertebral graft remains in good position. The rod and  pedicle screws are located.  Degenerative endplate spurring throughout the lumbar spine. No endplate erosion.  Osteopenia.  IMPRESSION: L4-5 PLIF with rod and pedicle screw fixation.  No adverse findings.   Electronically Signed   By: Jorje Guild M.D.   On: 11/22/2014 00:24   Mr Lumbar Spine Wo Contrast  11/22/2014   CLINICAL DATA:  Severe low back pain. Fever and incontinence. Surgery lumbar surgery 11/18/2014.  EXAM: MRI LUMBAR SPINE WITHOUT CONTRAST  TECHNIQUE: Multiplanar, multisequence MR imaging of the lumbar spine was performed. No intravenous contrast was administered.  COMPARISON:  09/16/2014  FINDINGS: The examination had to be discontinued prior to completion due to patient pain and inability to tolerate further imaging. Sagittal T1, T2, and STIR and axial T2 weighted images were obtained.  Vertebral alignment is unchanged without significant listhesis. Vertebral body heights are preserved. Sequelae of interval L4-5 PLIF are identified. There is diffuse lumbar disc desiccation. Multiple small Schmorl's nodes are again seen. Conus medullaris is normal in signal and terminates at L1. Small T2 hyperintense renal lesions are similar to the prior study and may represent cysts. 2.2 cm left adrenal nodule is unchanged.  T10-11 and T11-12: Only imaged sagittally. Mild disc bulging without evidence of significant spinal canal stenosis. Mild right neural foraminal narrowing at T10-11.  T12-L1:  Mild disc bulging without stenosis, unchanged.  L1-2:  Mild disc bulging without stenosis, unchanged.  L2-3: Mild disc bulging and mild facet hypertrophy without stenosis, unchanged.  L3-4: Interval bilateral laminotomies. Unchanged circumferential disc bulging and facet hypertrophy contributing to spinal canal and lateral recess narrowing as well as mild-to-moderate left neural foraminal stenosis. Superior extension of postoperative fluid collection at L4-5 contributes to increased spinal stenosis at and just below  the disc space level, further described below.  L4-5: Interval wide posterior decompression and posterior and interbody fusion. Fluid collection at the laminectomy site measures approximately 2.4 x 2.5 x 4.6 cm (transverse x AP x craniocaudal). There is a 3.0 x 1.3 x 1.5 cm oblong markedly T2 hypointense focus at the anterior aspect of the fluid collection with complete thecal sac effacement at this level. Neural foramina are not well evaluated due to artifact and postsurgical changes. Postoperative fluid collection in the superficial soft tissues at the incision measures 3.8 x 1.5 x 8.6 cm. There is edema diffusely within the posterior paraspinal soft tissues.  L5-S1: Severe right and moderate left facet arthrosis with slightly increased size of small right facet joint effusion. No definite progressive marrow edema involving the facets. Disc bulging and facet disease results in moderate right and mild left neural foraminal stenosis without spinal stenosis, unchanged.  IMPRESSION: 1. Interval posterior decompression and fusion at L4-5. 4.6 cm postoperative fluid collection at the laminectomy site with additional 3.8 cm low signal focus along its anterior margin. This is nonspecific, with considerations including retracted hematoma, gas, and possibly residual osseous fragments. Further evaluation with CT is suggested. There is resultant complete thecal sac effacement at L4-5. Fluid collection results in increased spinal canal narrowing at L3-4 as well. Superimposed infection is not excluded. 2. Severe facet arthrosis at L5-S1 with slightly increased facet joint effusion on the right.   Electronically Signed   By: Logan Bores   On: 11/22/2014 07:35   Dg Lumbar Spine 1 View  11/18/2014   CLINICAL DATA:  Lumbar spine surgery.  Spondylolisthesis.  EXAM: LUMBAR SPINE - 1 VIEW  COMPARISON:  MRI 09/16/2014  FINDINGS: Single intraoperative image demonstrates surgical marking along the posterior aspect of L5-S1.   IMPRESSION: Surgical marking at L5-S1.   Electronically Signed   By: Markus Daft M.D.   On: 11/18/2014 14:59   Dg Chest Port 1 View  11/20/2014   CLINICAL DATA:  Sudden fever.  Recent spine surgery  EXAM: PORTABLE CHEST - 1 VIEW  COMPARISON:  None currently available  FINDINGS: There are coarsened lung markings, especially in the upper lungs. No edema or focal asymmetric opacity. No pleural effusion or pneumothorax. Heart size within normal limits. Mild aortic tortuosity. No acute osseous findings.  IMPRESSION: 1. No asymmetric airspace  opacity suggestive of pneumonia. 2. There are coarse upper lung markings which favor scarring or other chronic lung disease, recommend followup chest x-ray to establish stability.   Electronically Signed   By: Jorje Guild M.D.   On: 11/20/2014 00:34   Dg Abd 2 Views  11/22/2014   CLINICAL DATA:  Constipation.  EXAM: ABDOMEN - 2 VIEW  COMPARISON:  None.  FINDINGS: No abnormal bowel dilatation is noted. Status post surgical posterior fusion of lower lumbar spine. Moderate amount of stool is noted in the sigmoid colon and rectum concerning for possible constipation. No definite renal calculi are noted. Phleboliths are noted in the pelvis.  IMPRESSION: No abnormal bowel dilatation is noted. Moderate stool burden is noted in the sigmoid colon and rectum suggesting constipation.   Electronically Signed   By: Sabino Dick M.D.   On: 11/22/2014 00:24   Dg C-arm 1-60 Min  11/18/2014   CLINICAL DATA:  L4-5 PLIF  EXAM: LUMBAR SPINE - 2-3 VIEW; DG C-ARM 61-120 MIN  COMPARISON:  None.  FINDINGS: Two intraoperative fluoroscopic spot images of the lumbar spine are provided. Interval PLIF at L4-5.  IMPRESSION: PLIF at L4-5.   Electronically Signed   By: Kathreen Devoid   On: 11/18/2014 16:36    Microbiology: Recent Results (from the past 240 hour(s))  Urine culture     Status: None   Collection Time: 11/22/14 12:33 AM  Result Value Ref Range Status   Specimen Description URINE,  CATHETERIZED  Final   Special Requests CX ADDED AT 0139 ON 161096  Final   Culture  Setup Time   Final    11/22/2014 05:03 Performed at [facility]   Final    >=100,000 COLONIES/ML Performed at [facility]    Culture   Final    PSEUDOMONAS AERUGINOSA Performed at [facility]    Report Status 11/24/2014 FINAL  Final   Organism ID, Bacteria PSEUDOMONAS AERUGINOSA  Final      Susceptibility   Pseudomonas aeruginosa - MIC*    CEFEPIME 2 SENSITIVE Sensitive     CEFTAZIDIME 4 SENSITIVE Sensitive     CIPROFLOXACIN <=0.25 SENSITIVE Sensitive     GENTAMICIN <=1 SENSITIVE Sensitive     IMIPENEM 2 SENSITIVE Sensitive     PIP/TAZO 8 SENSITIVE Sensitive     TOBRAMYCIN <=1 SENSITIVE Sensitive     * PSEUDOMONAS AERUGINOSA  Culture, blood (routine x 2)     Status: None (Preliminary result)   Collection Time: 11/22/14  3:05 AM  Result Value Ref Range Status   Specimen Description BLOOD LEFT ARM  Final   Special Requests BOTTLES DRAWN AEROBIC AND ANAEROBIC 10CC  Final   Culture  Setup Time   Final    11/22/2014 11:03 Performed at [facility]    Culture   Final           BLOOD CULTURE RECEIVED NO GROWTH TO DATE CULTURE WILL BE HELD FOR 5 DAYS BEFORE ISSUING A FINAL NEGATIVE REPORT Performed at [facility]    Report Status PENDING  Incomplete  Culture, blood (routine x 2)     Status: None (Preliminary result)   Collection Time: 11/22/14  3:18 AM  Result Value Ref Range Status   Specimen Description BLOOD RIGHT HAND  Final   Special Requests BOTTLES DRAWN AEROBIC ONLY 10CC  Final   Culture  Setup Time   Final    11/22/2014 11:03 Performed at [facility]  Culture   Final           BLOOD CULTURE RECEIVED NO GROWTH TO DATE CULTURE WILL BE HELD FOR 5 DAYS BEFORE ISSUING A FINAL NEGATIVE REPORT Performed at [facility]    Report Status PENDING  Incomplete    Labs: Basic Metabolic Panel:  Recent Labs Lab  11/19/14 0327 11/22/14 0015 11/22/14 0910 11/23/14 0415 11/24/14 0549  NA 139 133* 134* 135* 134*  K 4.2 3.6* 3.2* 3.3* 3.8  CL 102 93* 96 97 96  CO2 27 26 26 25 25   GLUCOSE 84 117* 100* 77 83  BUN 12 12 13 12 8   CREATININE 0.89 0.66 0.63 0.66 0.69  CALCIUM 8.5 9.3 8.4 8.1* 8.8   Liver Function Tests:  Recent Labs Lab 11/22/14 0015 11/22/14 0910  AST 17 14  ALT 10 9  ALKPHOS 60 54  BILITOT 0.4 0.3  PROT 6.8 6.5  ALBUMIN 3.0* 2.9*   CBC:  Recent Labs Lab 11/19/14 0327 11/22/14 0015 11/22/14 0910 11/23/14 0415 11/24/14 0549 11/25/14 0631  WBC 6.4 10.1 8.4 5.6 4.8  --   NEUTROABS  --  8.8* 6.7  --   --   --   HGB 8.2* 8.0* 7.7* 7.4* 8.6*  8.5* 8.3*  HCT 25.7* 24.7* 23.6* 22.3* 26.2*  26.2* 25.3*  MCV 85.7 82.9 83.1 85.1 83.7  --   PLT 229 200 204 211 228  --    Signed:  Yittel Emrich  [facility] 11/25/2014, 11:11 AM

## 2014-11-25 NOTE — Clinical Social Work Placement (Signed)
Clinical Social Work Department CLINICAL SOCIAL WORK PLACEMENT NOTE 11/24/2014  Patient: Melody Ochoa, Melody Ochoa Account Number: [id number] Admit date: 11/21/2014  Clinical Social Worker: Adair Laundry Date/time: 11/24/2014 02:32 PM  Clinical Social Work is seeking post-discharge placement for this patient at the following level of care: SKILLED NURSING (*CSW will update this form in Epic as items are completed)   11/24/2014 Patient/family provided with [facility] Department of Clinical Social Work's list of facilities offering this level of care within the geographic area requested by the patient (or if unable, by the patient's family).  11/24/2014 Patient/family informed of their freedom to choose among providers that offer the needed level of care, that participate in Medicare, Medicaid or managed care program needed by the patient, have an available bed and are willing to accept the patient.  11/24/2014 Patient/family informed of [facility]' ownership interest in [facility], as well as of the fact that they are under no obligation to receive care at this facility.  PASARR submitted to EDS on 11/24/2014 PASARR number received on 11/24/2014  FL2 transmitted to all facilities in geographic area requested by pt/family on 11/24/2014 FL2 transmitted to all facilities within larger geographic area on   Patient informed that his/her managed care company has contracts with or will negotiate with certain facilities, including the following:    Patient/family informed of bed offers received:11/25/14 Patient chooses bed at [facility] recommends and patient chooses bed at   Patient to be transferred to [facility] on 11/25/14 Patient to be transferred to facility by Ambulance Patient and family notified of transfer on Patient states she has notified family (11/25/14) Name of family member notified:    The following physician request were entered in Epic: Physician Request  Please sign FL2.    Additional Comments: Per MD patient ready for DC to [facility]. RN, patient, patient's family, and facility notified of DC. RN given number for report. DC packet on chart. [facility] transport requested for patient for 2:00PM (Service request ID: [id number]). CSW signing off.    Liz Beach MSW, [place], [place], [phone]

## 2014-11-25 NOTE — Progress Notes (Signed)
Pt prepared for d/c to SNF. IV d/c'd. Skin intact except as most recently charted. Vitals are stable. Report called to receiving facility. Pt to be transported by ambulance service. 

## 2014-11-25 NOTE — Discharge Instructions (Signed)
Follow with Kirk Ruths., MD in 5-7 days  Please get a complete blood count and chemistry panel checked by your Primary MD at your next visit, and again as instructed by your Primary MD. Please get your medications reviewed and adjusted by your Primary MD.  Please request your Primary MD to go over all Hospital Tests and Procedure/Radiological results at the follow up, please get all Hospital records sent to your Prim MD by signing hospital release before you go home.  If you had Pneumonia of Lung problems at the Hospital: Please get a 2 view Chest X ray done in 6-8 weeks after hospital discharge or sooner if instructed by your Primary MD.  If you have Congestive Heart Failure: Please call your Cardiologist or Primary MD anytime you have any of the following symptoms:  1) 3 pound weight gain in 24 hours or 5 pounds in 1 week  2) shortness of breath, with or without a dry hacking cough  3) swelling in the hands, feet or stomach  4) if you have to sleep on extra pillows at night in order to breathe  Follow cardiac low salt diet and 1.5 lit/day fluid restriction.  If you have diabetes Accuchecks 4 times/day, Once in AM empty stomach and then before each meal. Log in all results and show them to your primary doctor at your next visit. If any glucose reading is under 80 or above 300 call your primary MD immediately.  If you have Seizure/Convulsions/Epilepsy: Please do not drive, operate heavy machinery, participate in activities at heights or participate in high speed sports until you have seen by Primary MD or a Neurologist and advised to do so again.  If you had Gastrointestinal Bleeding: Please ask your Primary MD to check a complete blood count within one week of discharge or at your next visit. Your endoscopic/colonoscopic biopsies that are pending at the time of discharge, will also need to followed by your Primary MD.  Get Medicines reviewed and adjusted. Please take all your  medications with you for your next visit with your Primary MD  Please request your Primary MD to go over all hospital tests and procedure/radiological results at the follow up, please ask your Primary MD to get all Hospital records sent to his/her office.  If you experience worsening of your admission symptoms, develop shortness of breath, life threatening emergency, suicidal or homicidal thoughts you must seek medical attention immediately by calling 911 or calling your MD immediately  if symptoms less severe.  You must read complete instructions/literature along with all the possible adverse reactions/side effects for all the Medicines you take and that have been prescribed to you. Take any new Medicines after you have completely understood and accpet all the possible adverse reactions/side effects.   Do not drive or operate heavy machinery when taking Pain medications.   Do not take more than prescribed Pain, Sleep and Anxiety Medications  Special Instructions: If you have smoked or chewed Tobacco  in the last 2 yrs please stop smoking, stop any regular Alcohol  and or any Recreational drug use.  Wear Seat belts while driving.  Please note You were cared for by a hospitalist during your hospital stay. If you have any questions about your discharge medications or the care you received while you were in the hospital after you are discharged, you can call the unit and asked to speak with the hospitalist on call if the hospitalist that took care of you is not available. Once  you are discharged, your primary care physician will handle any further medical issues. Please note that NO REFILLS for any discharge medications will be authorized once you are discharged, as it is imperative that you return to your primary care physician (or establish a relationship with a primary care physician if you do not have one) for your aftercare needs so that they can reassess your need for medications and monitor your  lab values.  You can reach the hospitalist office at phone [phone] or fax [phone]   If you do not have a primary care physician, you can call [phone] for a physician referral.  Activity: As tolerated with Full fall precautions use walker/cane & assistance as needed  Diet: regular  Disposition Home

## 2014-11-25 NOTE — Progress Notes (Signed)
Physical Therapy Treatment Patient Details Name: SKYRAH KRUPP MRN: [id number] DOB: 04/29/1941 Today's Date: 11/25/2014    History of Present Illness pt presents with back pain and UTI after recent L4-5 Fusion with hx of Lumbar Lami and a Neck surgery.      PT Comments    Pt progressing towards physical therapy goals. Pt continues to show some decreased safety awareness with gait training, and close guard recommended as pt occasionally takes side steps to gain balance. Pt is able to recall back  precautions, however frequently requires reminders during task-completion to maintain those precautions.   Follow Up Recommendations  SNF;Supervision/Assistance - 24 hour     Equipment Recommendations  None recommended by PT    Recommendations for Other Services       Precautions / Restrictions Precautions Precautions: Back;Fall Precaution Comments: pt able to recall back precautions from previous admit.   Required Braces or Orthoses: Spinal Brace Spinal Brace: Applied in sitting position;Lumbar corset Restrictions Weight Bearing Restrictions: No    Mobility  Bed Mobility Overal bed mobility: Needs Assistance Bed Mobility: Sit to Sidelying;Rolling Rolling: Supervision       Sit to sidelying: Min assist General bed mobility comments: Assist for LE elevation back into bed.   Transfers Overall transfer level: Needs assistance Equipment used: Rolling walker (2 wheeled) Transfers: Sit to/from Stand Sit to Stand: Min assist         General transfer comment: VC's for UE support on arm rests of chair duirng transfer to full stand. Even after discussing hand placement pt asking if PT will hold walker in place for her and puts her hands on the walker. Reminder given and pt was able to complete transition to standing with min assist.   Ambulation/Gait Ambulation/Gait assistance: Min guard Ambulation Distance (Feet): 150 Feet Assistive device: Rolling walker (2 wheeled) Gait  Pattern/deviations: Step-through pattern;Decreased stride length;Drifts right/left Gait velocity: Decreased Gait velocity interpretation: Below normal speed for age/gender General Gait Details: Pt continues to move slowly and shuffle feet. She required cueing for walker placement and min guard for occasional side steps to gain balance.    Stairs            Wheelchair Mobility    Modified Rankin (Stroke Patients Only)       Balance Overall balance assessment: Needs assistance Sitting-balance support: Feet supported;No upper extremity supported Sitting balance-Leahy Scale: Fair Sitting balance - Comments: pt uses UEs to minimize pain in back.     Standing balance support: Bilateral upper extremity supported Standing balance-Leahy Scale: Poor Standing balance comment: Pt requires UE support                    Cognition Arousal/Alertness: Awake/alert Behavior During Therapy: WFL for tasks assessed/performed Overall Cognitive Status: Impaired/Different from baseline Area of Impairment: Safety/judgement;Memory     Memory: Decreased short-term memory   Safety/Judgement: Decreased awareness of safety          Exercises      General Comments        Pertinent Vitals/Pain Pain Assessment: 0-10 Pain Score: 7  Pain Location: Back Pain Descriptors / Indicators: Aching;Throbbing Pain Intervention(s): Limited activity within patient's tolerance;Monitored during session;Patient requesting pain meds-RN notified;RN gave pain meds during session    Home Living                      Prior Function            PT Goals (current goals  can now be found in the care plan section) Acute Rehab PT Goals Patient Stated Goal: to move normal again.   PT Goal Formulation: With patient Time For Goal Achievement: 11/30/14 Potential to Achieve Goals: Good Progress towards PT goals: Progressing toward goals    Frequency  Min 5X/week    PT Plan Current plan  remains appropriate    Co-evaluation             End of Session Equipment Utilized During Treatment: Gait belt;Back brace Activity Tolerance: Patient limited by pain Patient left: in chair;with call bell/phone within reach;with family/visitor present     Time: 6195-0932 PT Time Calculation (min) (ACUTE ONLY): 28 min  Charges:  $Gait Training: 8-22 mins $Therapeutic Activity: 8-22 mins                    G Codes:      Rolinda Roan 2014/11/27, 1:48 PM   Rolinda Roan, PT, DPT Acute Rehabilitation Services Pager: [phone]

## 2014-11-25 NOTE — Care Management Note (Signed)
    Page 1 of 1   11/25/2014     2:22:37 PM CARE MANAGEMENT NOTE 11/25/2014  Patient:  ALANE, HANSSEN   Account Number:  [id number]  Date Initiated:  11/25/2014  Documentation initiated by:  Tomi Bamberger  Subjective/Objective Assessment:   dx uti, post op worsening pain  admit- lives with spouse.     Action/Plan:   pt eval- rec snf   Anticipated DC Date:  11/25/2014   Anticipated DC Plan:  SKILLED NURSING FACILITY  In-house referral  Clinical Social Worker      DC Planning Services  CM consult      Choice offered to / List presented to:             Status of service:  Completed, signed off Medicare Important Message given?  YES (If response is "NO", the following Medicare IM given date fields will be blank) Date Medicare IM given:  11/25/2014 Medicare IM given by:  Tomi Bamberger Date Additional Medicare IM given:   Additional Medicare IM given by:    Discharge Disposition:  [place]  Per UR Regulation:  Reviewed for med. necessity/level of care/duration of stay  If discussed at [place] of Stay Meetings, dates discussed:    Comments:  11/25/14 [place], BSN [phone] patient for dc to snf today, CSW following.

## 2014-11-28 LAB — CULTURE, BLOOD (ROUTINE X 2)
Culture: NO GROWTH
Culture: NO GROWTH

## 2014-12-02 LAB — CBC WITH DIFFERENTIAL/PLATELET
Basophil #: 0.1 10*3/uL (ref 0.0–0.1)
Basophil %: 1.2 %
Eosinophil #: 0.4 10*3/uL (ref 0.0–0.7)
Eosinophil %: 5.8 %
HCT: 26.7 % — ABNORMAL LOW (ref 35.0–47.0)
HGB: 8.6 g/dL — ABNORMAL LOW (ref 12.0–16.0)
Lymphocyte #: 1.4 10*3/uL (ref 1.0–3.6)
Lymphocyte %: 19.5 %
MCH: 28 pg (ref 26.0–34.0)
MCHC: 32.3 g/dL (ref 32.0–36.0)
MCV: 87 fL (ref 80–100)
Monocyte #: 0.6 x10 3/mm (ref 0.2–0.9)
Monocyte %: 8.6 %
Neutrophil #: 4.6 10*3/uL (ref 1.4–6.5)
Neutrophil %: 64.9 %
Platelet: 372 10*3/uL (ref 150–440)
RBC: 3.09 10*6/uL — ABNORMAL LOW (ref 3.80–5.20)
RDW: 15.1 % — ABNORMAL HIGH (ref 11.5–14.5)
WBC: 7.1 10*3/uL (ref 3.6–11.0)

## 2014-12-06 ENCOUNTER — Encounter: Payer: Self-pay | Admitting: Internal Medicine

## 2015-06-12 ENCOUNTER — Other Ambulatory Visit: Payer: Self-pay | Admitting: Internal Medicine

## 2015-06-12 DIAGNOSIS — R221 Localized swelling, mass and lump, neck: Secondary | ICD-10-CM

## 2015-06-16 DIAGNOSIS — I700 Atherosclerosis of aorta: Secondary | ICD-10-CM | POA: Insufficient documentation

## 2015-06-19 ENCOUNTER — Ambulatory Visit: Admission: RE | Admit: 2015-06-19 | Payer: Medicare Other | Source: Ambulatory Visit

## 2015-06-26 ENCOUNTER — Other Ambulatory Visit: Payer: Self-pay | Admitting: Specialist

## 2015-06-26 DIAGNOSIS — R0602 Shortness of breath: Secondary | ICD-10-CM

## 2015-06-26 DIAGNOSIS — R9389 Abnormal findings on diagnostic imaging of other specified body structures: Secondary | ICD-10-CM

## 2015-07-02 ENCOUNTER — Ambulatory Visit: Payer: Medicare Other | Attending: Specialist

## 2015-08-04 DIAGNOSIS — R7303 Prediabetes: Secondary | ICD-10-CM | POA: Insufficient documentation

## 2015-11-20 ENCOUNTER — Telehealth: Payer: Self-pay

## 2015-11-20 NOTE — Telephone Encounter (Signed)
Please schedule patient for Colonoscopy. I did triage this morning. See below. All medications have been verified.   Patient states that she will be out from 10:30am-12pm today, then will be available entire afternoon.

## 2015-11-20 NOTE — Telephone Encounter (Signed)
Patient has been contacted and scheduled for 12/08/2014 all info has been mailed

## 2015-11-20 NOTE — Telephone Encounter (Signed)
Gastroenterology Pre-Procedure Review  Request Date: as soon as possible Requesting Physician: Dr. Allen Norris  PATIENT REVIEW QUESTIONS: The patient responded to the following health history questions as indicated:    1. Are you having any GI issues? no 2. Do you have a personal history of Polyps? yes (Polyps that need to be evaluated every 5 years per Dr. Jamal Collin) 3. Do you have a family history of Colon Cancer or Polyps? no 4. Diabetes Mellitus? no 5. Joint replacements in the past 12 months?no 6. Major health problems in the past 3 months?no 7. Any artificial heart valves, MVP, or defibrillator?no    MEDICATIONS & ALLERGIES:    Patient reports the following regarding taking any anticoagulation/antiplatelet therapy:   Plavix, Coumadin, Eliquis, Xarelto, Lovenox, Pradaxa, Brilinta, or Effient? no Aspirin? no  Patient confirms/reports the following medications:  Current Outpatient Prescriptions  Medication Sig Dispense Refill  . buPROPion (WELLBUTRIN SR) 200 MG 12 hr tablet Take 200 mg by mouth 2 (two) times daily.  2  . Calcium Carbonate-Vitamin D (CALTRATE 600+D PO) Take 1 tablet by mouth daily.    . diazepam (VALIUM) 5 MG tablet Take 1 tablet (5 mg total) by mouth every 6 (six) hours as needed for muscle spasms. 30 tablet 1  . DULoxetine (CYMBALTA) 60 MG capsule Take 60 mg by mouth daily.    . ferrous sulfate 325 (65 FE) MG tablet Take 1 tablet (325 mg total) by mouth 3 (three) times daily with meals. 90 tablet 1  . gabapentin (NEURONTIN) 300 MG capsule Take 600 mg by mouth at bedtime. 300 mg in am am and 600 mg in pm  3  . oxyCODONE (OXY IR/ROXICODONE) 5 MG immediate release tablet Take 1 tablet (5 mg total) by mouth every 4 (four) hours as needed for severe pain. 30 tablet 0  . pantoprazole (PROTONIX) 40 MG tablet Take 40 mg by mouth daily.  3   No current facility-administered medications for this visit.    Patient confirms/reports the following allergies:  Allergies  Allergen  Reactions  . Other Rash    bandaides    No orders of the defined types were placed in this encounter.    AUTHORIZATION INFORMATION Primary Insurance: 1D#: Group #:  Secondary Insurance: 1D#: Group #:  SCHEDULE INFORMATION: Date:  Time: Location:

## 2015-11-24 ENCOUNTER — Encounter: Admission: RE | Payer: Self-pay | Source: Ambulatory Visit

## 2015-11-24 ENCOUNTER — Ambulatory Visit: Admission: RE | Admit: 2015-11-24 | Payer: Medicare Other | Source: Ambulatory Visit | Admitting: Gastroenterology

## 2015-11-24 SURGERY — COLONOSCOPY WITH PROPOFOL
Anesthesia: General

## 2015-12-03 ENCOUNTER — Other Ambulatory Visit: Payer: Self-pay

## 2016-01-09 DIAGNOSIS — M7061 Trochanteric bursitis, right hip: Secondary | ICD-10-CM | POA: Insufficient documentation

## 2016-01-09 DIAGNOSIS — M7062 Trochanteric bursitis, left hip: Secondary | ICD-10-CM | POA: Insufficient documentation

## 2016-01-13 ENCOUNTER — Ambulatory Visit: Payer: Medicare Other | Admitting: Certified Registered"

## 2016-01-13 ENCOUNTER — Ambulatory Visit
Admission: RE | Admit: 2016-01-13 | Discharge: 2016-01-13 | Disposition: A | Discharge status: Home / Self Care | Payer: Medicare Other | Source: Ambulatory Visit | Attending: Gastroenterology | Admitting: Gastroenterology

## 2016-01-13 ENCOUNTER — Encounter
Admission: RE | Disposition: A | Discharge status: Home / Self Care | Payer: Self-pay | Source: Ambulatory Visit | Attending: Gastroenterology

## 2016-01-13 ENCOUNTER — Encounter: Payer: Self-pay | Admitting: *Deleted

## 2016-01-13 DIAGNOSIS — R011 Cardiac murmur, unspecified: Secondary | ICD-10-CM | POA: Insufficient documentation

## 2016-01-13 DIAGNOSIS — Z79899 Other long term (current) drug therapy: Secondary | ICD-10-CM | POA: Diagnosis not present

## 2016-01-13 DIAGNOSIS — K5730 Diverticulosis of large intestine without perforation or abscess without bleeding: Secondary | ICD-10-CM | POA: Insufficient documentation

## 2016-01-13 DIAGNOSIS — R42 Dizziness and giddiness: Secondary | ICD-10-CM | POA: Diagnosis not present

## 2016-01-13 DIAGNOSIS — Z85828 Personal history of other malignant neoplasm of skin: Secondary | ICD-10-CM | POA: Insufficient documentation

## 2016-01-13 DIAGNOSIS — Z87891 Personal history of nicotine dependence: Secondary | ICD-10-CM | POA: Insufficient documentation

## 2016-01-13 DIAGNOSIS — Z9109 Other allergy status, other than to drugs and biological substances: Secondary | ICD-10-CM | POA: Insufficient documentation

## 2016-01-13 DIAGNOSIS — K5520 Angiodysplasia of colon without hemorrhage: Secondary | ICD-10-CM | POA: Insufficient documentation

## 2016-01-13 DIAGNOSIS — F329 Major depressive disorder, single episode, unspecified: Secondary | ICD-10-CM | POA: Insufficient documentation

## 2016-01-13 DIAGNOSIS — Z860100 Personal history of colon polyps, unspecified: Secondary | ICD-10-CM | POA: Insufficient documentation

## 2016-01-13 DIAGNOSIS — D649 Anemia, unspecified: Secondary | ICD-10-CM | POA: Diagnosis not present

## 2016-01-13 DIAGNOSIS — M1990 Unspecified osteoarthritis, unspecified site: Secondary | ICD-10-CM | POA: Diagnosis not present

## 2016-01-13 DIAGNOSIS — K219 Gastro-esophageal reflux disease without esophagitis: Secondary | ICD-10-CM | POA: Diagnosis not present

## 2016-01-13 DIAGNOSIS — Z1211 Encounter for screening for malignant neoplasm of colon: Secondary | ICD-10-CM | POA: Insufficient documentation

## 2016-01-13 HISTORY — PX: COLONOSCOPY WITH PROPOFOL: SHX5780

## 2016-01-13 SURGERY — COLONOSCOPY WITH PROPOFOL
Anesthesia: General

## 2016-01-13 MED ORDER — PROPOFOL 500 MG/50ML IV EMUL
INTRAVENOUS | Status: DC | PRN
  Administered 2016-01-13: 120 ug/kg/min via INTRAVENOUS

## 2016-01-13 MED ORDER — PROPOFOL 10 MG/ML IV BOLUS
INTRAVENOUS | Status: DC | PRN
  Administered 2016-01-13: 70 mg via INTRAVENOUS
  Administered 2016-01-13: 30 mg via INTRAVENOUS

## 2016-01-13 MED ORDER — LIDOCAINE HCL (CARDIAC) 20 MG/ML IV SOLN
INTRAVENOUS | Status: DC | PRN
  Administered 2016-01-13: 50 mg via INTRAVENOUS

## 2016-01-13 MED ORDER — SODIUM CHLORIDE 0.9 % IV SOLN
INTRAVENOUS | Status: DC | PRN
  Administered 2016-01-13: 09:00:00 via INTRAVENOUS

## 2016-01-13 NOTE — Anesthesia Postprocedure Evaluation (Signed)
Anesthesia Post Note  Patient: Melody Ochoa  Procedure(s) Performed: Procedure(s) (LRB): COLONOSCOPY WITH PROPOFOL (N/A)  Patient location during evaluation: PACU Anesthesia Type: General Level of consciousness: awake and alert Pain management: pain level controlled Vital Signs Assessment: post-procedure vital signs reviewed and stable Respiratory status: spontaneous breathing Cardiovascular status: stable Anesthetic complications: no    Last Vitals:  Filed Vitals:   01/13/16 0927 01/13/16 0930  BP: 122/66 122/66  Pulse: 70 68  Temp: 36 C 36 C  Resp: 12 13    Last Pain:  Filed Vitals:   01/13/16 0937  PainSc: 4                  VAN STAVEREN,Hena Ewalt

## 2016-01-13 NOTE — H&P (Signed)
[facility]  [address] [place], [place] [address] Phone: [phone] Fax : [phone]  Primary Care Physician:  Kirk Ruths., MD Primary Gastroenterologist:  Dr. Allen Norris  Pre-Procedure History & Physical: HPI:  Melody Ochoa is a 75 y.o. female is here for an colonoscopy.   Past Medical History  Diagnosis Date  . PONV (postoperative nausea and vomiting)     yrs ago  . Heart murmur   . Depression   . GERD (gastroesophageal reflux disease)   . Arthritis   . Cancer ([place])     skin  . Diverticulosis   . Dizziness     patient had episode of dizziness when came in room. hx past no dx    Past Surgical History  Procedure Laterality Date  . Tonsillectomy    . Facelift  94  . Cervical disc surgery  09  . Back surgery  13    Prior to Admission medications   Medication Sig Start Date End Date Taking? Authorizing Provider  buPROPion (WELLBUTRIN SR) 200 MG 12 hr tablet Take 200 mg by mouth 2 (two) times daily. 10/06/14  Yes Historical Provider, MD  Calcium Carbonate-Vitamin D (CALTRATE 600+D PO) Take 1 tablet by mouth daily.   Yes Historical Provider, MD  diazepam (VALIUM) 5 MG tablet Take 1 tablet (5 mg total) by mouth every 6 (six) hours as needed for muscle spasms. 11/25/14  Yes Costin Karlyne Greenspan, MD  DULoxetine (CYMBALTA) 60 MG capsule Take 60 mg by mouth daily.   Yes Historical Provider, MD  ferrous sulfate 325 (65 FE) MG tablet Take 1 tablet (325 mg total) by mouth 3 (three) times daily with meals. 11/25/14  Yes Costin Karlyne Greenspan, MD  gabapentin (NEURONTIN) 300 MG capsule Take 600 mg by mouth at bedtime. 300 mg in am am and 600 mg in pm 09/01/14  Yes Historical Provider, MD  oxyCODONE (OXY IR/ROXICODONE) 5 MG immediate release tablet Take 1 tablet (5 mg total) by mouth every 4 (four) hours as needed for severe pain. 11/25/14  Yes Costin Karlyne Greenspan, MD  pantoprazole (PROTONIX) 40 MG tablet Take 40 mg by mouth daily. 10/16/14  Yes Historical Provider,  MD    Allergies as of 12/03/2015 - Review Complete 11/22/2014  Allergen Reaction Noted  . Other Rash 11/13/2014    History reviewed. No pertinent family history.  Social History   Social History  . Marital Status: Married    Spouse Name: N/A  . Number of Children: N/A  . Years of Education: N/A   Occupational History  . Not on file.   Social History Main Topics  . Smoking status: Former Smoker -- 1.00 packs/day for 40 years    Types: Cigarettes    Quit date: 11/13/1996  . Smokeless tobacco: Not on file  . Alcohol Use: Yes     Comment: occ wine  . Drug Use: No  . Sexual Activity: Not on file   Other Topics Concern  . Not on file   Social History Narrative    Review of Systems: See HPI, otherwise negative ROS  Physical Exam: BP 156/79 mmHg  Pulse 81  Temp(Src) 96.5 F (35.8 C) (Tympanic)  Resp 20  Ht 5\' 4"  (1.626 m)  Wt 180 lb (81.647 kg)  BMI 30.88 kg/m2  SpO2 97% General:   Alert,  pleasant and cooperative in NAD Head:  Normocephalic and atraumatic. Neck:  Supple; no masses or thyromegaly. Lungs:  Clear throughout to auscultation.    Heart:  Regular rate and rhythm. Abdomen:  Soft, nontender and nondistended. Normal bowel sounds, without guarding, and without rebound.   Neurologic:  Alert and  oriented x4;  grossly normal neurologically.  Impression/Plan: Melody Ochoa is here for an colonoscopy to be performed for history of colon poylps  Risks, benefits, limitations, and alternatives regarding  colonoscopy have been reviewed with the patient.  Questions have been answered.  All parties agreeable.   Ollen Bowl, MD  01/13/2016, 8:44 AM

## 2016-01-13 NOTE — Op Note (Signed)
[facility] Patient Name: Melody Ochoa Procedure Date: 01/13/2016 9:08 AM MRN: [id number] Account #: [id number] Date of Birth: 03/15/1941 Admit Type: Outpatient Age: 75 Room: [facility] ENDO ROOM 4 Gender: Female Note Status: Finalized Procedure:         Colonoscopy Indications:       High risk colon cancer surveillance: Personal history of                     colonic polyps Providers:         [name], MD Referring MD:      Ocie Cornfield. Ouida Sills, MD (Referring MD) Medicines:         Propofol per Anesthesia Complications:     No immediate complications. Procedure:         Pre-Anesthesia Assessment:                    - Prior to the procedure, a History and Physical was                     performed, and patient medications and allergies were                     reviewed. The patient's tolerance of previous anesthesia                     was also reviewed. The risks and benefits of the procedure                     and the sedation options and risks were discussed with the                     patient. All questions were answered, and informed consent                     was obtained. Prior Anticoagulants: The patient has taken                     no previous anticoagulant or antiplatelet agents. ASA                     Grade Assessment: II - A patient with mild systemic                     disease. After reviewing the risks and benefits, the                     patient was deemed in satisfactory condition to undergo                     the procedure.                    After obtaining informed consent, the colonoscope was                     passed under direct vision. Throughout the procedure, the                     patient's blood pressure, pulse, and oxygen saturations                     were monitored continuously. The Colonoscope was  introduced through the anus and advanced to the the cecum,                     identified  by appendiceal orifice and ileocecal valve. The                     colonoscopy was performed without difficulty. The patient                     tolerated the procedure well. The quality of the bowel                     preparation was excellent. Findings:      The perianal and digital rectal examinations were normal.      Multiple small-mouthed diverticula were found in the entire colon.      A single medium-sized angiodysplastic lesion without bleeding was found       in the ascending colon. Impression:        - Diverticulosis in the entire examined colon.                    - A single non-bleeding colonic angiodysplastic lesion.                    - No specimens collected. Recommendation:    - Use fiber, for example Citrucel, Fibercon, Konsyl or                     Metamucil. Procedure Code(s): --- Professional ---                    [phone], Colonoscopy, flexible; diagnostic, including                     collection of specimen(s) by brushing or washing, when                     performed (separate procedure) Diagnosis Code(s): --- Professional ---                    Z86.010, Personal history of colonic polyps CPT copyright 2014 American Medical Association. All rights reserved. The codes documented in this report are preliminary and upon coder review may  be revised to meet current compliance requirements. [name], MD 01/13/2016 9:28:33 AM This report has been signed electronically. Number of Addenda: 0 Note Initiated On: 01/13/2016 9:08 AM Scope Withdrawal Time: 0 hours 6 minutes 58 seconds  Total Procedure Duration: 0 hours 11 minutes 8 seconds       [facility]

## 2016-01-13 NOTE — Anesthesia Preprocedure Evaluation (Signed)
Anesthesia Evaluation  Patient identified by MRN, date of birth, ID band Patient awake    Reviewed: Allergy & Precautions, NPO status   Airway Mallampati: III       Dental  (+) Teeth Intact, Caps   Pulmonary former smoker,    Pulmonary exam normal        Cardiovascular Exercise Tolerance: Good  Rhythm:Regular     Neuro/Psych    GI/Hepatic Neg liver ROS,   Endo/Other  negative endocrine ROS  Renal/GU negative Renal ROS     Musculoskeletal   Abdominal Normal abdominal exam  (+)   Peds negative pediatric ROS (+)  Hematology negative hematology ROS (+) anemia ,   Anesthesia Other Findings   Reproductive/Obstetrics                             Anesthesia Physical Anesthesia Plan  ASA: II  Anesthesia Plan: General   Post-op Pain Management:    Induction: Intravenous  Airway Management Planned: Natural Airway and Nasal Cannula  Additional Equipment:   Intra-op Plan:   Post-operative Plan:   Informed Consent: I have reviewed the patients History and Physical, chart, labs and discussed the procedure including the risks, benefits and alternatives for the proposed anesthesia with the patient or authorized representative who has indicated his/her understanding and acceptance.     Plan Discussed with: CRNA  Anesthesia Plan Comments:         Anesthesia Quick Evaluation

## 2016-01-13 NOTE — Transfer of Care (Signed)
Immediate Anesthesia Transfer of Care Note  Patient: Melody Ochoa  Procedure(s) Performed: Procedure(s): COLONOSCOPY WITH PROPOFOL (N/A)  Patient Location: Endoscopy Unit  Anesthesia Type:General  Level of Consciousness: awake  Airway & Oxygen Therapy: Patient Spontanous Breathing and Patient connected to nasal cannula oxygen  Post-op Assessment: Report given to RN  Post vital signs: Reviewed  Last Vitals:  Filed Vitals:   01/13/16 0837 01/13/16 0927  BP: 156/79 122/66  Pulse: 81 70  Temp: 35.8 C 36 C  Resp: 20 12    Complications: No apparent anesthesia complications

## 2016-01-24 ENCOUNTER — Encounter: Payer: Self-pay | Admitting: Emergency Medicine

## 2016-01-24 ENCOUNTER — Emergency Department: Payer: Medicare Other

## 2016-01-24 ENCOUNTER — Emergency Department
Admission: EM | Admit: 2016-01-24 | Discharge: 2016-01-24 | Disposition: A | Discharge status: Home / Self Care | Payer: Medicare Other | Attending: Emergency Medicine | Admitting: Emergency Medicine

## 2016-01-24 DIAGNOSIS — R42 Dizziness and giddiness: Secondary | ICD-10-CM | POA: Insufficient documentation

## 2016-01-24 DIAGNOSIS — Z79899 Other long term (current) drug therapy: Secondary | ICD-10-CM | POA: Insufficient documentation

## 2016-01-24 DIAGNOSIS — Z87891 Personal history of nicotine dependence: Secondary | ICD-10-CM | POA: Insufficient documentation

## 2016-01-24 LAB — TROPONIN I: Troponin I: <0.03 ng/mL (ref <0.031)

## 2016-01-24 LAB — BASIC METABOLIC PANEL
Anion gap: 3 — ABNORMAL LOW (ref 5–15)
BUN: 17 mg/dL (ref 6–20)
CO2: 30 mmol/L (ref 22–32)
Calcium: 9 mg/dL (ref 8.9–10.3)
Chloride: 104 mmol/L (ref 101–111)
Creatinine, Ser: 0.91 mg/dL (ref 0.44–1.00)
GFR calc Af Amer: >60 mL/min (ref >60)
GFR calc non Af Amer: >60 mL/min (ref >60)
Glucose, Bld: 111 mg/dL — ABNORMAL HIGH (ref 65–99)
Potassium: 3.9 mmol/L (ref 3.5–5.1)
Sodium: 137 mmol/L (ref 135–145)

## 2016-01-24 LAB — CBC
HCT: 34.5 % — ABNORMAL LOW (ref 35.0–47.0)
Hemoglobin: 11.6 g/dL — ABNORMAL LOW (ref 12.0–16.0)
MCH: 28.9 pg (ref 26.0–34.0)
MCHC: 33.7 g/dL (ref 32.0–36.0)
MCV: 85.8 fL (ref 80.0–100.0)
Platelets: 283 10*3/uL (ref 150–440)
RBC: 4.02 MIL/uL (ref 3.80–5.20)
RDW: 14.2 % (ref 11.5–14.5)
WBC: 9.7 10*3/uL (ref 3.6–11.0)

## 2016-01-24 LAB — URINALYSIS COMPLETE WITH MICROSCOPIC (ARMC ONLY)
Bilirubin Urine: NEGATIVE
Glucose, UA: NEGATIVE mg/dL
Hgb urine dipstick: NEGATIVE
Leukocytes, UA: NEGATIVE
Nitrite: NEGATIVE
Protein, ur: NEGATIVE mg/dL
Specific Gravity, Urine: 1.019 (ref 1.005–1.030)
pH: 6 (ref 5.0–8.0)

## 2016-01-24 MED ORDER — MECLIZINE HCL 25 MG PO TABS: 25.0000 mg | ORAL_TABLET | Freq: Three times a day (TID) | ORAL | Status: DC | PRN

## 2016-01-24 NOTE — ED Notes (Signed)
States dizziness began 3 days ago. Dizziness is worse when standing up.  Pt states she has a hx of anemia. Pt also reports feeling unsteady on her feet and describes it as "I don't have control over my limbs"  Pt uses a cane to ambulate. Dizziness has not changed since Wednesday.

## 2016-01-24 NOTE — Discharge Instructions (Signed)
Please seek medical attention for any high fevers, chest pain, shortness of breath, change in behavior, persistent vomiting, bloody stool or any other new or concerning symptoms. ° ° °Dizziness °Dizziness is a common problem. It is a feeling of unsteadiness or light-headedness. You may feel like you are about to faint. Dizziness can lead to injury if you stumble or fall. Anyone can become dizzy, but dizziness is more common in older adults. This condition can be caused by a number of things, including medicines, dehydration, or illness. °HOME CARE INSTRUCTIONS °Taking these steps may help with your condition: °Eating and Drinking °· Drink enough fluid to keep your urine clear or pale yellow. This helps to keep you from becoming dehydrated. Try to drink more clear fluids, such as water. °· Do not drink alcohol. °· Limit your caffeine intake if directed by your health care provider. °· Limit your salt intake if directed by your health care provider. °Activity °· Avoid making quick movements. °¨ Rise slowly from chairs and steady yourself until you feel okay. °¨ In the morning, first sit up on the side of the bed. When you feel okay, stand slowly while you hold onto something until you know that your balance is fine. °· Move your legs often if you need to stand in one place for a long time. Tighten and relax your muscles in your legs while you are standing. °· Do not drive or operate heavy machinery if you feel dizzy. °· Avoid bending down if you feel dizzy. Place items in your home so that they are easy for you to reach without leaning over. °Lifestyle °· Do not use any tobacco products, including cigarettes, chewing tobacco, or electronic cigarettes. If you need help quitting, ask your health care provider. °· Try to reduce your stress level, such as with yoga or meditation. Talk with your health care provider if you need help. °General Instructions °· Watch your dizziness for any changes. °· Take medicines only as  directed by your health care provider. Talk with your health care provider if you think that your dizziness is caused by a medicine that you are taking. °· Tell a friend or a family member that you are feeling dizzy. If he or she notices any changes in your behavior, have this person call your health care provider. °· Keep all follow-up visits as directed by your health care provider. This is important. °SEEK MEDICAL CARE IF: °· Your dizziness does not go away. °· Your dizziness or light-headedness gets worse. °· You feel nauseous. °· You have reduced hearing. °· You have new symptoms. °· You are unsteady on your feet or you feel like the room is spinning. °SEEK IMMEDIATE MEDICAL CARE IF: °· You vomit or have diarrhea and are unable to eat or drink anything. °· You have problems talking, walking, swallowing, or using your arms, hands, or legs. °· You feel generally weak. °· You are not thinking clearly or you have trouble forming sentences. It may take a friend or family member to notice this. °· You have chest pain, abdominal pain, shortness of breath, or sweating. °· Your vision changes. °· You notice any bleeding. °· You have a headache. °· You have neck pain or a stiff neck. °· You have a fever. °  °This information is not intended to replace advice given to you by your health care provider. Make sure you discuss any questions you have with your health care provider. °  °Document Released: 05/18/2001 Document Revised: 04/08/2015   Document Reviewed: 11/18/2014 °Elsevier Interactive Patient Education ©2016 [employer] ° °

## 2016-01-24 NOTE — ED Provider Notes (Signed)
[facility] Emergency Department Provider Note    ____________________________________________  Time seen: ~1920  I have reviewed the triage vital signs and the nursing notes.   HISTORY  Chief Complaint Dizziness   History limited by: Not Limited   HPI Melody Ochoa is a 75 y.o. female who presents to the emergency department today because of concerns for dizziness. The patient states that for the past 3 days she has felt dizzy. She describes it as a sense of feeling like her head is heavy. She feels like she has had is some increased difficulty with walking. The symptoms have been constant. The patient denies any head pain. She denies any trauma. She states she has had similar symptoms occasionally in the past however typically they would only last for a morning and were very infrequent. She denies any chest pain or palpitations. Denies any shortness of breath. Denies any fevers.    Past Medical History  Diagnosis Date  . PONV (postoperative nausea and vomiting)     yrs ago  . Heart murmur   . Depression   . GERD (gastroesophageal reflux disease)   . Arthritis   . Cancer ([place])     skin  . Diverticulosis   . Dizziness     patient had episode of dizziness when came in room. hx past no dx    Patient Active Problem List   Diagnosis Date Noted  . Personal history of colonic polyps   . Back pain 11/22/2014  . UTI (lower urinary tract infection) 11/22/2014  . Sepsis ([place]) 11/22/2014  . Low back pain 11/22/2014  . Fever 11/22/2014  . Normocytic anemia 11/22/2014  . Spondylolisthesis of lumbar region 11/18/2014    Past Surgical History  Procedure Laterality Date  . Tonsillectomy    . Facelift  94  . Cervical disc surgery  09  . Back surgery  13  . Colonoscopy with propofol N/A 01/13/2016    Procedure: COLONOSCOPY WITH PROPOFOL;  Surgeon: [name], MD;  Location: [facility] ENDOSCOPY;  Service: Endoscopy;  Laterality: N/A;    Current  Outpatient Rx  Name  Route  Sig  Dispense  Refill  . buPROPion (WELLBUTRIN SR) 200 MG 12 hr tablet   Oral   Take 200 mg by mouth 2 (two) times daily.      2   . Calcium Carbonate-Vitamin D (CALTRATE 600+D PO)   Oral   Take 1 tablet by mouth daily.         . diazepam (VALIUM) 5 MG tablet   Oral   Take 1 tablet (5 mg total) by mouth every 6 (six) hours as needed for muscle spasms.   30 tablet   1   . DULoxetine (CYMBALTA) 60 MG capsule   Oral   Take 60 mg by mouth daily.         . ferrous sulfate 325 (65 FE) MG tablet   Oral   Take 1 tablet (325 mg total) by mouth 3 (three) times daily with meals.   90 tablet   1   . gabapentin (NEURONTIN) 300 MG capsule   Oral   Take 600 mg by mouth at bedtime. 300 mg in am am and 600 mg in pm      3   . oxyCODONE (OXY IR/ROXICODONE) 5 MG immediate release tablet   Oral   Take 1 tablet (5 mg total) by mouth every 4 (four) hours as needed for severe pain.   30 tablet  0   . pantoprazole (PROTONIX) 40 MG tablet   Oral   Take 40 mg by mouth daily.      3     Allergies Aspirin and Other  History reviewed. No pertinent family history.  Social History Social History  Substance Use Topics  . Smoking status: Former Smoker -- 1.00 packs/day for 40 years    Types: Cigarettes    Quit date: 11/13/1996  . Smokeless tobacco: None  . Alcohol Use: Yes     Comment: occ wine    Review of Systems  Constitutional: Negative for fever. Cardiovascular: Negative for chest pain. Respiratory: Negative for shortness of breath. Gastrointestinal: Negative for abdominal pain, vomiting and diarrhea. Neurological: Negative for headaches. Positive for dizziness  10-point ROS otherwise negative.  ____________________________________________   PHYSICAL EXAM:  VITAL SIGNS: ED Triage Vitals  Enc Vitals Group     BP 01/24/16 1524 165/86 mmHg     Pulse Rate 01/24/16 1524 100     Resp 01/24/16 1524 20     Temp 01/24/16 1524 98.3 F  (36.8 C)     Temp Source 01/24/16 1524 Oral     SpO2 01/24/16 1524 98 %     Weight 01/24/16 1524 180 lb (81.647 kg)     Height 01/24/16 1524 5\' 4"  (1.626 m)   Constitutional: Alert and oriented. Well appearing and in no distress. Eyes: Conjunctivae are normal. PERRL. Normal extraocular movements. ENT   Head: Normocephalic and atraumatic.   Nose: No congestion/rhinnorhea.   Mouth/Throat: Mucous membranes are moist.   Neck: No stridor. Hematological/Lymphatic/Immunilogical: No cervical lymphadenopathy. Cardiovascular: Normal rate, regular rhythm.  No murmurs, rubs, or gallops. Respiratory: Normal respiratory effort without tachypnea nor retractions. Breath sounds are clear and equal bilaterally. No wheezes/rales/rhonchi. Gastrointestinal: Soft and nontender. No distention.  Genitourinary: Deferred Musculoskeletal: Normal range of motion in all extremities. No joint effusions.  No lower extremity tenderness nor edema. Neurologic:  Normal speech and language. No gross focal neurologic deficits are appreciated.  Skin:  Skin is warm, dry and intact. No rash noted. Psychiatric: Mood and affect are normal. Speech and behavior are normal. Patient exhibits appropriate insight and judgment.  ____________________________________________    LABS (pertinent positives/negatives)  Labs Reviewed  BASIC METABOLIC PANEL - Abnormal; Notable for the following:    Glucose, Bld 111 (*)    Anion gap 3 (*)    All other components within normal limits  CBC - Abnormal; Notable for the following:    Hemoglobin 11.6 (*)    HCT 34.5 (*)    All other components within normal limits  URINALYSIS COMPLETEWITH MICROSCOPIC ([facility] ONLY) - Abnormal; Notable for the following:    Color, Urine YELLOW (*)    APPearance CLEAR (*)    Ketones, ur TRACE (*)    Bacteria, UA RARE (*)    Squamous Epithelial / LPF 0-5 (*)    All other components within normal limits  TROPONIN I  CBG MONITORING, ED      ____________________________________________   EKG  I, Nance Pear, attending physician, personally viewed and interpreted this EKG  EKG Time: 1532 Rate: 92 Rhythm: normal sinus rhythm Axis: left axis deviation Intervals: qtc 464 QRS: narrow, LVH ST changes: no st elevation Impression: abnormal ekg   ____________________________________________    RADIOLOGY  MRI brain  IMPRESSION: No acute intracranial abnormality. Mild chronic microvascular ischemia.   ____________________________________________   PROCEDURES  Procedure(s) performed: None  Critical Care performed: No  ____________________________________________   INITIAL IMPRESSION / ASSESSMENT  AND PLAN / ED COURSE  Pertinent labs & imaging results that were available during my care of the patient were reviewed by me and considered in my medical decision making (see chart for details).  Patient presented to the emergency department today because of 3 days of constant dizziness. MRI of the brain was obtained to evaluate for posterior stroke. This was negative. The patient also had negative blood work including negative troponin. Negative urine. At this point unclear cause of the dizziness however central lesion seems highly unlikely given negative MRI. Discussed with patient that we will try meclizine and have her follow up with primary care.  ____________________________________________   FINAL CLINICAL IMPRESSION(S) / ED DIAGNOSES  Final diagnoses:  Dizziness     Nance Pear, MD 01/24/16 1958

## 2016-03-10 ENCOUNTER — Other Ambulatory Visit: Payer: Self-pay | Admitting: Physical Medicine and Rehabilitation

## 2016-03-10 DIAGNOSIS — M1611 Unilateral primary osteoarthritis, right hip: Secondary | ICD-10-CM

## 2016-03-30 ENCOUNTER — Ambulatory Visit: Payer: Medicare Other

## 2016-04-07 ENCOUNTER — Ambulatory Visit
Admission: RE | Admit: 2016-04-07 | Discharge: 2016-04-07 | Disposition: A | Discharge status: Home / Self Care | Payer: Medicare Other | Source: Ambulatory Visit | Attending: Physical Medicine and Rehabilitation | Admitting: Physical Medicine and Rehabilitation

## 2016-04-07 DIAGNOSIS — M1612 Unilateral primary osteoarthritis, left hip: Secondary | ICD-10-CM | POA: Diagnosis not present

## 2016-04-07 DIAGNOSIS — M7062 Trochanteric bursitis, left hip: Secondary | ICD-10-CM | POA: Diagnosis present

## 2016-04-07 DIAGNOSIS — M7061 Trochanteric bursitis, right hip: Secondary | ICD-10-CM | POA: Insufficient documentation

## 2016-04-07 DIAGNOSIS — K5730 Diverticulosis of large intestine without perforation or abscess without bleeding: Secondary | ICD-10-CM | POA: Diagnosis not present

## 2016-04-07 DIAGNOSIS — M1611 Unilateral primary osteoarthritis, right hip: Secondary | ICD-10-CM | POA: Diagnosis not present

## 2016-04-20 ENCOUNTER — Other Ambulatory Visit: Payer: Medicare Other

## 2016-04-21 ENCOUNTER — Ambulatory Visit
Admission: RE | Admit: 2016-04-21 | Discharge: 2016-04-21 | Disposition: A | Discharge status: Home / Self Care | Payer: Medicare Other | Source: Ambulatory Visit | Attending: Surgery | Admitting: Surgery

## 2016-04-21 ENCOUNTER — Encounter
Admission: RE | Admit: 2016-04-21 | Discharge: 2016-04-21 | Disposition: A | Discharge status: Home / Self Care | Payer: Medicare Other | Source: Ambulatory Visit | Attending: Surgery | Admitting: Surgery

## 2016-04-21 DIAGNOSIS — Z87891 Personal history of nicotine dependence: Secondary | ICD-10-CM

## 2016-04-21 DIAGNOSIS — Z01812 Encounter for preprocedural laboratory examination: Secondary | ICD-10-CM

## 2016-04-21 DIAGNOSIS — R918 Other nonspecific abnormal finding of lung field: Secondary | ICD-10-CM

## 2016-04-21 DIAGNOSIS — Z01810 Encounter for preprocedural cardiovascular examination: Secondary | ICD-10-CM

## 2016-04-21 DIAGNOSIS — M1611 Unilateral primary osteoarthritis, right hip: Secondary | ICD-10-CM

## 2016-04-21 LAB — SURGICAL PCR SCREEN
MRSA, PCR: NEGATIVE
Staphylococcus aureus: NEGATIVE

## 2016-04-21 LAB — URINALYSIS COMPLETE WITH MICROSCOPIC (ARMC ONLY)
Bacteria, UA: NONE SEEN
Bilirubin Urine: NEGATIVE
Glucose, UA: NEGATIVE mg/dL
Hgb urine dipstick: NEGATIVE
Ketones, ur: NEGATIVE mg/dL
Leukocytes, UA: NEGATIVE
Nitrite: NEGATIVE
Protein, ur: NEGATIVE mg/dL
Specific Gravity, Urine: 1.012 (ref 1.005–1.030)
pH: 6 (ref 5.0–8.0)

## 2016-04-21 LAB — BASIC METABOLIC PANEL
Anion gap: 6 (ref 5–15)
BUN: 11 mg/dL (ref 6–20)
CO2: 28 mmol/L (ref 22–32)
Calcium: 9.1 mg/dL (ref 8.9–10.3)
Chloride: 103 mmol/L (ref 101–111)
Creatinine, Ser: 0.9 mg/dL (ref 0.44–1.00)
GFR calc Af Amer: >60 mL/min (ref >60)
GFR calc non Af Amer: >60 mL/min (ref >60)
Glucose, Bld: 94 mg/dL (ref 65–99)
Potassium: 4.3 mmol/L (ref 3.5–5.1)
Sodium: 137 mmol/L (ref 135–145)

## 2016-04-21 LAB — TYPE AND SCREEN
ABO/RH(D): O POS
Antibody Screen: NEGATIVE

## 2016-04-21 LAB — CBC
HCT: 34.1 % — ABNORMAL LOW (ref 35.0–47.0)
Hemoglobin: 11.5 g/dL — ABNORMAL LOW (ref 12.0–16.0)
MCH: 29.3 pg (ref 26.0–34.0)
MCHC: 33.8 g/dL (ref 32.0–36.0)
MCV: 86.9 fL (ref 80.0–100.0)
Platelets: 259 10*3/uL (ref 150–440)
RBC: 3.92 MIL/uL (ref 3.80–5.20)
RDW: 13.6 % (ref 11.5–14.5)
WBC: 6.3 10*3/uL (ref 3.6–11.0)

## 2016-04-21 LAB — PROTIME-INR
INR: 0.88
Prothrombin Time: 12.2 seconds (ref 11.4–15.0)

## 2016-04-21 LAB — ABO/RH: ABO/RH(D): O POS

## 2016-04-21 NOTE — Patient Instructions (Signed)
Your procedure is scheduled on: TOMORROW Report to Day Surgery. To find out your arrival time please call [phone] between 1PM - 3PM on TODAY.  Remember: Instructions that are not followed completely may result in serious medical risk, up to and including death, or upon the discretion of your surgeon and anesthesiologist your surgery may need to be rescheduled.    __X__ 1. Do not eat food or drink liquids after midnight. No gum chewing or hard candies.     __X__ 2. No Alcohol for 24 hours before or after surgery.   ____ 3. Bring all medications with you on the day of surgery if instructed.    __X__ 4. Notify your doctor if there is any change in your medical condition     (cold, fever, infections).     Do not wear jewelry, make-up, hairpins, clips or nail polish.  Do not wear lotions, powders, or perfumes.   Do not shave 48 hours prior to surgery. Men may shave face and neck.  Do not bring valuables to the hospital.    [facility] is not responsible for any belongings or valuables.               Contacts, dentures or bridgework may not be worn into surgery.  Leave your suitcase in the car. After surgery it may be brought to your room.  For patients admitted to the hospital, discharge time is determined by your                treatment team.   Patients discharged the day of surgery will not be allowed to drive home.   Please read over the following fact sheets that you were given:   MRSA Information and Surgical Site Infection Prevention   __X__ Take these medicines the morning of surgery with A SIP OF WATER:    1. BUPROPION  2. DULOXETINE  3. PANTOPRAZOLE  4. VALIUM AND HYDROCODONE IF NEEDED  5.  6.  ____ Fleet Enema (as directed)   __X__ Use CHG Soap as directed  ____ Use inhalers on the day of surgery  ____ Stop metformin 2 days prior to surgery    ____ Take 1/2 of usual insulin dose the night before surgery and none on the morning of surgery.   ____ Stop  Coumadin/Plavix/aspirin on   ____ Stop Anti-inflammatories on    ____ Stop supplements until after surgery.    ____ Bring C-Pap to the hospital.

## 2016-04-22 ENCOUNTER — Inpatient Hospital Stay: Payer: Medicare Other | Admitting: Certified Registered Nurse Anesthetist

## 2016-04-22 ENCOUNTER — Inpatient Hospital Stay: Payer: Medicare Other

## 2016-04-22 ENCOUNTER — Encounter
Admission: RE | Disposition: A | Discharge status: Skilled Nursing Facility | Payer: Self-pay | Source: Ambulatory Visit | Attending: Surgery

## 2016-04-22 ENCOUNTER — Encounter: Payer: Self-pay | Admitting: *Deleted

## 2016-04-22 ENCOUNTER — Inpatient Hospital Stay
Admission: RE | Admit: 2016-04-22 | Discharge: 2016-04-25 | DRG: 470 | Disposition: A | Discharge status: Skilled Nursing Facility | Payer: Medicare Other | Source: Ambulatory Visit | Attending: Specialist | Admitting: Specialist

## 2016-04-22 DIAGNOSIS — R4182 Altered mental status, unspecified: Secondary | ICD-10-CM | POA: Diagnosis present

## 2016-04-22 DIAGNOSIS — Z823 Family history of stroke: Secondary | ICD-10-CM

## 2016-04-22 DIAGNOSIS — Z96642 Presence of left artificial hip joint: Secondary | ICD-10-CM

## 2016-04-22 DIAGNOSIS — M719 Bursopathy, unspecified: Secondary | ICD-10-CM | POA: Diagnosis present

## 2016-04-22 DIAGNOSIS — G8929 Other chronic pain: Secondary | ICD-10-CM | POA: Diagnosis present

## 2016-04-22 DIAGNOSIS — Z85828 Personal history of other malignant neoplasm of skin: Secondary | ICD-10-CM | POA: Diagnosis not present

## 2016-04-22 DIAGNOSIS — M545 Low back pain: Secondary | ICD-10-CM | POA: Diagnosis present

## 2016-04-22 DIAGNOSIS — Z79899 Other long term (current) drug therapy: Secondary | ICD-10-CM | POA: Diagnosis not present

## 2016-04-22 DIAGNOSIS — D649 Anemia, unspecified: Secondary | ICD-10-CM | POA: Diagnosis present

## 2016-04-22 DIAGNOSIS — F419 Anxiety disorder, unspecified: Secondary | ICD-10-CM | POA: Diagnosis present

## 2016-04-22 DIAGNOSIS — Z801 Family history of malignant neoplasm of trachea, bronchus and lung: Secondary | ICD-10-CM | POA: Diagnosis not present

## 2016-04-22 DIAGNOSIS — G629 Polyneuropathy, unspecified: Secondary | ICD-10-CM | POA: Diagnosis present

## 2016-04-22 DIAGNOSIS — Z87891 Personal history of nicotine dependence: Secondary | ICD-10-CM

## 2016-04-22 DIAGNOSIS — M1611 Unilateral primary osteoarthritis, right hip: Secondary | ICD-10-CM | POA: Diagnosis present

## 2016-04-22 DIAGNOSIS — K219 Gastro-esophageal reflux disease without esophagitis: Secondary | ICD-10-CM | POA: Diagnosis present

## 2016-04-22 DIAGNOSIS — F329 Major depressive disorder, single episode, unspecified: Secondary | ICD-10-CM | POA: Diagnosis present

## 2016-04-22 DIAGNOSIS — E871 Hypo-osmolality and hyponatremia: Secondary | ICD-10-CM | POA: Diagnosis not present

## 2016-04-22 DIAGNOSIS — Z96641 Presence of right artificial hip joint: Secondary | ICD-10-CM

## 2016-04-22 HISTORY — PX: TOTAL HIP ARTHROPLASTY: SHX124

## 2016-04-22 SURGERY — ARTHROPLASTY, HIP, TOTAL,POSTERIOR APPROACH
Anesthesia: Choice | Site: Hip | Laterality: Right | Wound class: Clean

## 2016-04-22 MED ORDER — FENTANYL CITRATE (PF) 100 MCG/2ML IJ SOLN
INTRAMUSCULAR | Status: DC | PRN
  Administered 2016-04-22 (×2): 100 ug via INTRAVENOUS
  Administered 2016-04-22: 150 ug via INTRAVENOUS

## 2016-04-22 MED ORDER — PANTOPRAZOLE SODIUM 40 MG PO TBEC
40.0000 mg | DELAYED_RELEASE_TABLET | Freq: Every day | ORAL | Status: DC
  Administered 2016-04-23 – 2016-04-25 (×3): 40 mg via ORAL
  Filled 2016-04-22 (×3): qty 1

## 2016-04-22 MED ORDER — ONDANSETRON HCL 4 MG/2ML IJ SOLN
INTRAMUSCULAR | Status: DC | PRN
  Administered 2016-04-22: 4 mg via INTRAVENOUS

## 2016-04-22 MED ORDER — BUPIVACAINE-EPINEPHRINE (PF) 0.25% -1:200000 IJ SOLN
INTRAMUSCULAR | Status: AC
  Filled 2016-04-22: qty 30

## 2016-04-22 MED ORDER — GABAPENTIN 300 MG PO CAPS
600.0000 mg | ORAL_CAPSULE | Freq: Every day | ORAL | Status: DC
  Administered 2016-04-22 – 2016-04-24 (×3): 600 mg via ORAL
  Filled 2016-04-22 (×3): qty 2

## 2016-04-22 MED ORDER — ACETAMINOPHEN 650 MG RE SUPP: 650.0000 mg | Freq: Four times a day (QID) | RECTAL | Status: DC | PRN

## 2016-04-22 MED ORDER — NEOSTIGMINE METHYLSULFATE 5 MG/5ML IV SOSY
PREFILLED_SYRINGE | INTRAVENOUS | Status: DC | PRN
  Administered 2016-04-22: 3 mg via INTRAVENOUS
  Administered 2016-04-22: 2 mg via INTRAVENOUS

## 2016-04-22 MED ORDER — LIDOCAINE HCL (CARDIAC) 20 MG/ML IV SOLN
INTRAVENOUS | Status: DC | PRN
  Administered 2016-04-22: 100 mg via INTRAVENOUS

## 2016-04-22 MED ORDER — MAGNESIUM HYDROXIDE 400 MG/5ML PO SUSP
30.0000 mL | Freq: Every day | ORAL | Status: DC | PRN
  Administered 2016-04-23 – 2016-04-24 (×2): 30 mL via ORAL
  Filled 2016-04-22 (×2): qty 30

## 2016-04-22 MED ORDER — DIAZEPAM 2 MG PO TABS
2.0000 mg | ORAL_TABLET | Freq: Three times a day (TID) | ORAL | Status: DC | PRN
  Administered 2016-04-23: 2 mg via ORAL
  Filled 2016-04-22: qty 1

## 2016-04-22 MED ORDER — DIPHENHYDRAMINE HCL 12.5 MG/5ML PO ELIX
12.5000 mg | ORAL_SOLUTION | ORAL | Status: DC | PRN
  Filled 2016-04-22: qty 10

## 2016-04-22 MED ORDER — CEFAZOLIN SODIUM-DEXTROSE 2-4 GM/100ML-% IV SOLN
2.0000 g | Freq: Once | INTRAVENOUS | Status: AC
  Administered 2016-04-22: 2 g via INTRAVENOUS

## 2016-04-22 MED ORDER — TRANEXAMIC ACID 1000 MG/10ML IV SOLN
INTRAVENOUS | Status: AC
  Filled 2016-04-22: qty 10

## 2016-04-22 MED ORDER — SODIUM CHLORIDE 0.9 % IJ SOLN
INTRAMUSCULAR | Status: AC
  Filled 2016-04-22: qty 50

## 2016-04-22 MED ORDER — SODIUM CHLORIDE 0.9 % IV SOLN
INTRAVENOUS | Status: DC | PRN
  Administered 2016-04-22: 60 mL

## 2016-04-22 MED ORDER — BUPIVACAINE LIPOSOME 1.3 % IJ SUSP
INTRAMUSCULAR | Status: AC
  Filled 2016-04-22: qty 20

## 2016-04-22 MED ORDER — TRANEXAMIC ACID 1000 MG/10ML IV SOLN
1000.0000 mg | INTRAVENOUS | Status: DC | PRN
  Administered 2016-04-22: 1000 mg via TOPICAL

## 2016-04-22 MED ORDER — ONDANSETRON HCL 4 MG/2ML IJ SOLN: 4.0000 mg | Freq: Four times a day (QID) | INTRAMUSCULAR | Status: DC | PRN

## 2016-04-22 MED ORDER — DOCUSATE SODIUM 100 MG PO CAPS
100.0000 mg | ORAL_CAPSULE | Freq: Two times a day (BID) | ORAL | Status: DC
  Administered 2016-04-22 – 2016-04-25 (×6): 100 mg via ORAL
  Filled 2016-04-22 (×6): qty 1

## 2016-04-22 MED ORDER — FLEET ENEMA 7-19 GM/118ML RE ENEM: 1.0000 | ENEMA | Freq: Once | RECTAL | Status: DC | PRN

## 2016-04-22 MED ORDER — BISACODYL 10 MG RE SUPP
10.0000 mg | Freq: Every day | RECTAL | Status: DC | PRN
  Filled 2016-04-22: qty 1

## 2016-04-22 MED ORDER — VITAMIN D 1000 UNITS PO TABS
1000.0000 [IU] | ORAL_TABLET | Freq: Every day | ORAL | Status: DC
  Administered 2016-04-23 – 2016-04-25 (×3): 1000 [IU] via ORAL
  Filled 2016-04-22 (×3): qty 1

## 2016-04-22 MED ORDER — LACTATED RINGERS IV SOLN
INTRAVENOUS | Status: DC
  Administered 2016-04-22 (×3): via INTRAVENOUS

## 2016-04-22 MED ORDER — ACETAMINOPHEN 10 MG/ML IV SOLN
INTRAVENOUS | Status: DC | PRN
  Administered 2016-04-22: 1000 mg via INTRAVENOUS

## 2016-04-22 MED ORDER — ENOXAPARIN SODIUM 30 MG/0.3ML ~~LOC~~ SOLN
30.0000 mg | Freq: Two times a day (BID) | SUBCUTANEOUS | Status: DC
  Administered 2016-04-23 – 2016-04-25 (×5): 30 mg via SUBCUTANEOUS
  Filled 2016-04-22 (×5): qty 0.3

## 2016-04-22 MED ORDER — ONDANSETRON HCL 4 MG/2ML IJ SOLN: 4.0000 mg | Freq: Once | INTRAMUSCULAR | Status: DC | PRN

## 2016-04-22 MED ORDER — ROCURONIUM BROMIDE 100 MG/10ML IV SOLN
INTRAVENOUS | Status: DC | PRN
  Administered 2016-04-22: 50 mg via INTRAVENOUS
  Administered 2016-04-22: 10 mg via INTRAVENOUS

## 2016-04-22 MED ORDER — ACETAMINOPHEN 325 MG PO TABS
650.0000 mg | ORAL_TABLET | Freq: Four times a day (QID) | ORAL | Status: DC | PRN
  Administered 2016-04-24: 650 mg via ORAL
  Filled 2016-04-22 (×2): qty 2

## 2016-04-22 MED ORDER — KCL IN DEXTROSE-NACL 20-5-0.9 MEQ/L-%-% IV SOLN
INTRAVENOUS | Status: DC
  Administered 2016-04-22 – 2016-04-23 (×3): via INTRAVENOUS
  Filled 2016-04-22 (×9): qty 1000

## 2016-04-22 MED ORDER — CEFAZOLIN SODIUM-DEXTROSE 2-4 GM/100ML-% IV SOLN
2.0000 g | Freq: Four times a day (QID) | INTRAVENOUS | Status: AC
  Administered 2016-04-22 – 2016-04-23 (×3): 2 g via INTRAVENOUS
  Filled 2016-04-22 (×3): qty 100

## 2016-04-22 MED ORDER — MIDAZOLAM HCL 2 MG/2ML IJ SOLN
INTRAMUSCULAR | Status: DC | PRN
  Administered 2016-04-22: 0.5 mg via INTRAVENOUS

## 2016-04-22 MED ORDER — BUPIVACAINE-EPINEPHRINE (PF) 0.5% -1:200000 IJ SOLN
INTRAMUSCULAR | Status: AC
Start: 2016-04-22 — End: 2016-04-22
  Filled 2016-04-22: qty 30

## 2016-04-22 MED ORDER — FERROUS SULFATE 325 (65 FE) MG PO TABS
325.0000 mg | ORAL_TABLET | Freq: Every day | ORAL | Status: DC
  Administered 2016-04-23 – 2016-04-25 (×3): 325 mg via ORAL
  Filled 2016-04-22 (×3): qty 1

## 2016-04-22 MED ORDER — KETAMINE HCL 50 MG/ML IJ SOLN
INTRAMUSCULAR | Status: DC | PRN
  Administered 2016-04-22: 30 mg via INTRAVENOUS

## 2016-04-22 MED ORDER — BUPROPION HCL ER (SR) 100 MG PO TB12
200.0000 mg | ORAL_TABLET | Freq: Two times a day (BID) | ORAL | Status: DC
  Administered 2016-04-22 – 2016-04-25 (×6): 200 mg via ORAL
  Filled 2016-04-22 (×7): qty 2

## 2016-04-22 MED ORDER — METOCLOPRAMIDE HCL 10 MG PO TABS: 5.0000 mg | ORAL_TABLET | Freq: Three times a day (TID) | ORAL | Status: DC | PRN

## 2016-04-22 MED ORDER — METOCLOPRAMIDE HCL 5 MG/ML IJ SOLN: 5.0000 mg | Freq: Three times a day (TID) | INTRAMUSCULAR | Status: DC | PRN

## 2016-04-22 MED ORDER — FENTANYL CITRATE (PF) 100 MCG/2ML IJ SOLN
INTRAMUSCULAR | Status: AC
  Filled 2016-04-22: qty 2

## 2016-04-22 MED ORDER — ACETAMINOPHEN 10 MG/ML IV SOLN
INTRAVENOUS | Status: AC
  Filled 2016-04-22: qty 100

## 2016-04-22 MED ORDER — OXYCODONE HCL 5 MG PO TABS
5.0000 mg | ORAL_TABLET | ORAL | Status: DC | PRN
  Administered 2016-04-22 (×2): 5 mg via ORAL
  Filled 2016-04-22 (×2): qty 1

## 2016-04-22 MED ORDER — ACETAMINOPHEN 500 MG PO TABS
1000.0000 mg | ORAL_TABLET | Freq: Four times a day (QID) | ORAL | Status: AC
  Administered 2016-04-22 – 2016-04-23 (×4): 1000 mg via ORAL
  Filled 2016-04-22 (×4): qty 2

## 2016-04-22 MED ORDER — ONDANSETRON HCL 4 MG PO TABS: 4.0000 mg | ORAL_TABLET | Freq: Four times a day (QID) | ORAL | Status: DC | PRN

## 2016-04-22 MED ORDER — NEOMYCIN-POLYMYXIN B GU 40-200000 IR SOLN
Status: AC
  Filled 2016-04-22: qty 20

## 2016-04-22 MED ORDER — PHENYLEPHRINE HCL 10 MG/ML IJ SOLN
INTRAMUSCULAR | Status: DC | PRN
  Administered 2016-04-22 (×2): 100 ug via INTRAVENOUS

## 2016-04-22 MED ORDER — PROPOFOL 10 MG/ML IV BOLUS
INTRAVENOUS | Status: DC | PRN
  Administered 2016-04-22: 120 mg via INTRAVENOUS

## 2016-04-22 MED ORDER — CEFAZOLIN SODIUM-DEXTROSE 2-4 GM/100ML-% IV SOLN
INTRAVENOUS | Status: AC
  Filled 2016-04-22: qty 100

## 2016-04-22 MED ORDER — FENTANYL CITRATE (PF) 100 MCG/2ML IJ SOLN
25.0000 ug | INTRAMUSCULAR | Status: DC | PRN
  Administered 2016-04-22: 25 ug via INTRAVENOUS

## 2016-04-22 MED ORDER — MECLIZINE HCL 25 MG PO TABS
25.0000 mg | ORAL_TABLET | Freq: Three times a day (TID) | ORAL | Status: DC | PRN
  Filled 2016-04-22: qty 1

## 2016-04-22 MED ORDER — DULOXETINE HCL 60 MG PO CPEP
120.0000 mg | ORAL_CAPSULE | Freq: Every day | ORAL | Status: DC
  Administered 2016-04-23 – 2016-04-25 (×3): 120 mg via ORAL
  Filled 2016-04-22 (×3): qty 2

## 2016-04-22 MED ORDER — NEOMYCIN-POLYMYXIN B GU 40-200000 IR SOLN
Status: DC | PRN
Start: 2016-04-22 — End: 2016-04-22
  Administered 2016-04-22: 16 mL

## 2016-04-22 MED ORDER — CALCIUM CARBONATE-VITAMIN D 500-200 MG-UNIT PO TABS
1.0000 | ORAL_TABLET | Freq: Every day | ORAL | Status: DC
  Administered 2016-04-23 – 2016-04-25 (×3): 1 via ORAL
  Filled 2016-04-22 (×3): qty 1

## 2016-04-22 MED ORDER — GLYCOPYRROLATE 0.2 MG/ML IV SOSY
PREFILLED_SYRINGE | INTRAVENOUS | Status: DC | PRN
  Administered 2016-04-22: 0.4 mg via INTRAVENOUS
  Administered 2016-04-22: .2 mg via INTRAVENOUS

## 2016-04-22 MED ORDER — PROPOFOL 500 MG/50ML IV EMUL
INTRAVENOUS | Status: DC | PRN
  Administered 2016-04-22: 70 ug/kg/min via INTRAVENOUS

## 2016-04-22 MED ORDER — KETAMINE HCL 100 MG/ML IJ SOLN
250.0000 mg | INTRAMUSCULAR | Status: DC | PRN
  Administered 2016-04-22: 7 ug/kg/min via INTRAVENOUS

## 2016-04-22 MED ORDER — HYDROMORPHONE HCL 1 MG/ML IJ SOLN
1.0000 mg | INTRAMUSCULAR | Status: DC | PRN
  Administered 2016-04-22 – 2016-04-23 (×3): 1 mg via INTRAVENOUS
  Filled 2016-04-22 (×3): qty 1

## 2016-04-22 MED ORDER — BUPIVACAINE-EPINEPHRINE (PF) 0.5% -1:200000 IJ SOLN
INTRAMUSCULAR | Status: DC | PRN
  Administered 2016-04-22: 30 mL via PERINEURAL

## 2016-04-22 SURGICAL SUPPLY — 65 items
BAG DECANTER FOR FLEXI CONT (MISCELLANEOUS) IMPLANT
BLADE SAGITTAL WIDE XTHICK NO (BLADE) ×2 IMPLANT
BLADE SURG SZ20 CARB STEEL (BLADE) ×2 IMPLANT
BNDG COHESIVE 6X5 TAN STRL LF (GAUZE/BANDAGES/DRESSINGS) ×2 IMPLANT
CANISTER SUCT 1200ML W/VALVE (MISCELLANEOUS) ×2 IMPLANT
CANISTER SUCT 3000ML (MISCELLANEOUS) ×4 IMPLANT
CAPT HIP TOTAL 2 ×2 IMPLANT
CATH TRAY METER 16FR LF (MISCELLANEOUS) ×2 IMPLANT
CHLORAPREP W/TINT 26ML (MISCELLANEOUS) ×2 IMPLANT
COVER MAYO STAND STRL (DRAPES) ×2 IMPLANT
DRAPE IMP U-DRAPE 54X76 (DRAPES) ×4 IMPLANT
DRAPE INCISE IOBAN 66X60 STRL (DRAPES) ×2 IMPLANT
DRAPE SHEET LG 3/4 BI-LAMINATE (DRAPES) ×2 IMPLANT
DRAPE SURG 17X11 SM STRL (DRAPES) ×4 IMPLANT
DRAPE TABLE BACK 80X90 (DRAPES) ×2 IMPLANT
DRSG OPSITE POSTOP 4X10 (GAUZE/BANDAGES/DRESSINGS) ×2 IMPLANT
DRSG OPSITE POSTOP 4X12 (GAUZE/BANDAGES/DRESSINGS) IMPLANT
DRSG OPSITE POSTOP 4X14 (GAUZE/BANDAGES/DRESSINGS) ×2 IMPLANT
ELECT BLADE 6.5 EXT (BLADE) ×2 IMPLANT
ELECT CAUTERY BLADE 6.4 (BLADE) ×2 IMPLANT
GAUZE PACK 2X3YD (MISCELLANEOUS) ×2 IMPLANT
GAUZE SPONGE 4X4 12PLY STRL (GAUZE/BANDAGES/DRESSINGS) ×2 IMPLANT
GLOVE BIO SURGEON STRL SZ7.5 (GLOVE) ×4 IMPLANT
GLOVE BIO SURGEON STRL SZ8 (GLOVE) ×4 IMPLANT
GLOVE BIOGEL PI IND STRL 8 (GLOVE) ×1 IMPLANT
GLOVE BIOGEL PI INDICATOR 8 (GLOVE) ×1
GLOVE INDICATOR 8.0 STRL GRN (GLOVE) ×2 IMPLANT
GOWN STRL REUS W/ TWL LRG LVL3 (GOWN DISPOSABLE) ×2 IMPLANT
GOWN STRL REUS W/ TWL XL LVL3 (GOWN DISPOSABLE) ×1 IMPLANT
GOWN STRL REUS W/TWL LRG LVL3 (GOWN DISPOSABLE) ×2
GOWN STRL REUS W/TWL XL LVL3 (GOWN DISPOSABLE) ×1
HANDPIECE SUCTION TUBG SURGILV (MISCELLANEOUS) ×2 IMPLANT
HOOD PEEL AWAY FLYTE STAYCOOL (MISCELLANEOUS) ×6 IMPLANT
IV NS 100ML SINGLE PACK (IV SOLUTION) IMPLANT
KIT RM TURNOVER STRD PROC AR (KITS) ×2 IMPLANT
NDL SAFETY 18GX1.5 (NEEDLE) ×2 IMPLANT
NEEDLE FILTER BLUNT 18X 1/2SAF (NEEDLE)
NEEDLE FILTER BLUNT 18X1 1/2 (NEEDLE) IMPLANT
NEEDLE SPNL 20GX3.5 QUINCKE YW (NEEDLE) ×2 IMPLANT
NEEDLE SPNL 22GX3.5 QUINCKE BK (NEEDLE) ×2 IMPLANT
NS IRRIG 1000ML POUR BTL (IV SOLUTION) ×2 IMPLANT
PACK HIP PROSTHESIS (MISCELLANEOUS) ×2 IMPLANT
PILLOW ABDUC SM (MISCELLANEOUS) ×2 IMPLANT
PIN STEINMANN 3/16X9 BAY 6PK (Pin) ×1 IMPLANT
PRESSURIZER CEMENT PROX FEM SM (MISCELLANEOUS) IMPLANT
SOL .9 NS 3000ML IRR  AL (IV SOLUTION) ×1
SOL .9 NS 3000ML IRR UROMATIC (IV SOLUTION) ×1 IMPLANT
SPONGE LAP 18X18 5 PK (GAUZE/BANDAGES/DRESSINGS) IMPLANT
SPONGE XRAY 4X4 16PLY STRL (MISCELLANEOUS) ×2 IMPLANT
ST PIN 3/16X9 BAY 6PK (Pin) ×2 IMPLANT
STAPLER SKIN PROX 35W (STAPLE) ×2 IMPLANT
SUT ETHIBOND #5 BRAIDED 30INL (SUTURE) ×2 IMPLANT
SUT ETHIBOND CT1 BRD #0 30IN (SUTURE) ×4 IMPLANT
SUT ETHIBOND CT1 BRD 2-0 30IN (SUTURE) IMPLANT
SUT VIC AB 1 CT1 36 (SUTURE) ×4 IMPLANT
SUT VIC AB 2-0 CT1 (SUTURE) ×4 IMPLANT
SUT VIC AB 2-0 CT1 27 (SUTURE) ×4
SUT VIC AB 2-0 CT1 TAPERPNT 27 (SUTURE) ×4 IMPLANT
SYR 20CC LL (SYRINGE) ×2 IMPLANT
SYR 30ML LL (SYRINGE) ×4 IMPLANT
SYR TB 1ML 27GX1/2 LL (SYRINGE) IMPLANT
SYRINGE 10CC LL (SYRINGE) ×2 IMPLANT
TAPE MICROFOAM 4IN (TAPE) ×2 IMPLANT
TAPE TRANSPORE STRL 2 31045 (GAUZE/BANDAGES/DRESSINGS) ×2 IMPLANT
TIP COAXIAL FEMORAL CANAL (MISCELLANEOUS) ×2 IMPLANT

## 2016-04-22 NOTE — Evaluation (Signed)
Physical Therapy Evaluation Patient Details Name: Melody Ochoa MRN: [id number] DOB: 26-Aug-1941 Today's Date: 04/22/2016   History of Present Illness  Pt admitted for R THR posterior approach and is POD 0 at time of evaluation.   Clinical Impression  Pt is a pleasant 75 year old female who was admitted for R THR-post approach. Pt educated on R post precautions. Pt performs bed mobility with mod assist. Heavy cues for sequencing required secondary to post precautions. Once seated at EOB, pt able to sit for approx 5 minutes, however then becomes dizzy. Pt unable to further tolerate transfers/ambulation and requested to return to bed. All mobility performed on RA with sats at 98%. Pt left of O2 as she reports no SOB.  Pt demonstrates deficits with strength/mobility/pain. Would benefit from skilled PT to address above deficits and promote optimal return to PLOF.       Follow Up Recommendations SNF    Equipment Recommendations       Recommendations for Other Services       Precautions / Restrictions Precautions Precautions: Posterior Hip Precaution Booklet Issued: No Restrictions Weight Bearing Restrictions: Yes RLE Weight Bearing: Weight bearing as tolerated      Mobility  Bed Mobility Overal bed mobility: Needs Assistance Bed Mobility: Supine to Sit     Supine to sit: Mod assist     General bed mobility comments: assist for bed mobility. Pt requires heavy cues for sequencing and maintaining hip precautions. Once seated at EOB, pt able to sit with min assist with increased lateral sway noted. Pt then becomes dizzy, requested to return back supine.  Transfers                 General transfer comment: unable to perform transfers at this time secondary to dizziness  Ambulation/Gait                Stairs            Wheelchair Mobility    Modified Rankin (Stroke Patients Only)       Balance Overall balance assessment: Needs  assistance Sitting-balance support: Feet supported;Bilateral upper extremity supported Sitting balance-Leahy Scale: Fair                                       Pertinent Vitals/Pain Pain Assessment: 0-10 Pain Score: 4  Pain Location: R LE Pain Descriptors / Indicators: Operative site guarding Pain Intervention(s): Limited activity within patient's tolerance;Ice applied    Home Living Family/patient expects to be discharged to:: Private residence Living Arrangements: Spouse/significant other Available Help at Discharge: Family;Available 24 hours/day Type of Home: House Home Access: Stairs to enter Entrance Stairs-Rails: Can reach both Entrance Stairs-Number of Steps: 2 Home Layout: One level Home Equipment: Walker - 2 wheels      Prior Function Level of Independence: Independent               Hand Dominance        Extremity/Trunk Assessment   Upper Extremity Assessment: Overall WFL for tasks assessed           Lower Extremity Assessment:  (R LE grossly 2/5; L LE grossly 4/5)         Communication   Communication: No difficulties  Cognition Arousal/Alertness: Awake/alert Behavior During Therapy: WFL for tasks assessed/performed Overall Cognitive Status: Within Functional Limits for tasks assessed  General Comments      Exercises Other Exercises Other Exercises: supine ther-ex performed on R LE including 10 reps on ankle pumps, quad sets, glut sets, and hip abd/add. All ther-ex performed with min/mod assist.      Assessment/Plan    PT Assessment Patient needs continued PT services  PT Diagnosis Difficulty walking;Generalized weakness;Acute pain   PT Problem List Decreased strength;Decreased mobility;Pain  PT Treatment Interventions Gait training;Therapeutic exercise   PT Goals (Current goals can be found in the Care Plan section) Acute Rehab PT Goals Patient Stated Goal: to get stronger PT Goal  Formulation: With patient Time For Goal Achievement: 05/06/16 Potential to Achieve Goals: Good    Frequency BID   Barriers to discharge        Co-evaluation               End of Session Equipment Utilized During Treatment: Gait belt Activity Tolerance: Patient limited by fatigue;Patient limited by pain Patient left: in bed;with bed alarm set Nurse Communication: Mobility status         Time: [id number] PT Time Calculation (min) (ACUTE ONLY): 37 min   Charges:   PT Evaluation $PT Eval Moderate Complexity: 1 Procedure PT Treatments $Therapeutic Exercise: 8-22 mins   PT G Codes:        Chimamanda Siegfried 2016-05-16, 3:38 PM Greggory Stallion, PT, DPT [phone]

## 2016-04-22 NOTE — Care Management Note (Addendum)
Case Management Note  Patient Details  Name: TAURA WEHRLE MRN: [id number] Date of Birth: Sep 17, 1941  Subjective/Objective:     75yo Ms Cartina Prothero received a right THR on 04/22/16 by Dr Roland Rack. She resides at home with her husband who can provide transportation to appointments. PCP=Dr Frazier Richards. Pharmacy= [facility] [address]  Has a standard walker ( a rolling walker was requested from [facility]), cane, BSC. No oxygen at home and no home health services. Chose [facility] for home health. Referral called to Corliss Blacker at [facility]. Case management will follow for discharge planning. Lovenox=$54.00. Provided Ms Schlender with a $25.00 coupon.                 Action/Plan:   Expected Discharge Date:                  Expected Discharge Plan:     In-House Referral:     Discharge planning Services     Post Acute Care Choice:    Choice offered to:     DME Arranged:    DME Agency:     HH Arranged:    [place] Agency:     Status of Service:     Medicare Important Message Given:    Date Medicare IM Given:    Medicare IM give by:    Date Additional Medicare IM Given:    Additional Medicare Important Message give by:     If discussed at [place] of Stay Meetings, dates discussed:    Additional Comments:  Rochelle Larue A, RN 04/22/2016, 2:54 PM

## 2016-04-22 NOTE — Progress Notes (Signed)
Pharmacy called to see if Dr. Roland Rack wanted the lovenox to start this evening or tomorrow morning. Spoke to him this afternoon and he stated it can start in th morning. The MAR has been updated.

## 2016-04-22 NOTE — Op Note (Signed)
04/22/2016  10:39 AM  Patient:   Melody Ochoa  Pre-Op Diagnosis:   Degenerative joint disease, right hip.  Post-Op Diagnosis:   Same.  Procedure:   Right total hip arthroplasty.  Surgeon:   Pascal Lux, MD  Assistant:   Cameron Proud, PA-C  Anesthesia:   GET  Findings:   As above.  Complications:   None  EBL:   400 cc  Fluids:   2000 cc crystalloid  UOP:   135 cc  TT:   None  Drains:   None  Closure:   Staples  Implants:   Biomet press-fit system with a #11 laterally offset Echo femoral stem, a 48 mm acetabular shell with an E-poly hi-wall liner, and a 32 mm ceramic head with a +6 mm neck.  Brief Clinical Note:   The patient is a 75 year old female with a long history of gradually worsening right hip pain. Her symptoms have progressed despite medications, activity modification, injections, etc. Her history and examination are consistent with advanced degenerative joint disease, confirmed by both plain radiographs and MRI scan. She presents at this time for a right total hip arthroplasty.   Procedure:   The patient was brought into the operating room. After adequate general endotracheal intubation and anesthesia was obtained, the patient was repositioned in the left lateral decubitus position and secured using a lateral hip positioner. The right hip and lower extremity were prepped with ChloroPrep solution before being draped sterilely. Preoperative antibiotics were administered. A timeout was performed to verify the appropriate surgical site before a standard posterior approach to the hip was made through an approximately 6-7 inch incision. The incision was carried down through the subcutaneous tissues to expose the gluteal fascia and proximal end of the iliotibial band. These structures were split the length of the incision and the Charnley self-retaining hip retractor placed. The bursal tissues were swept posteriorly to expose the short external rotators. The anterior  border of the piriformis tendon was identified and this plane developed down through the capsule to enter the joint. A flap of tissue was elevated off the posterior aspect of the femoral neck and greater trochanter and retracted posteriorly. This flap included the piriformis tendon, the short external rotators, and the posterior capsule. The soft tissues were elevated off the lateral aspect of the ilium and a large Steinmann pin placed bicortically. With the right leg aligned over the left, a drill bit was placed into the greater trochanter parallel to the Steinmann pin and the distance between these two pins measured in order to optimize leg lengths postoperatively. The drill bit was removed and the hip dislocated. Using the appropriate guide, a femoral neck cut was made 10-12 mm above the lesser trochanter. The femoral head was removed. The piriformis fossa was debrided of soft tissues before the intramedullary canal was accessed through this point using a triple step reamer. The canal was reamed sequentially beginning with a #7 tapered reamer and progressing to a #11 tapered reamer. This provided excellent circumferential chatter.   Attention was directed to the acetabular side. The labrum was debrided circumferentially before the ligamentum teres was removed using a large curette. A line was drawn on the drapes corresponding to the native version of the acetabulum. This line was used as a guide while the acetabulum was reamed sequentially beginning with a 41 mm reamer and progressing to a 47 mm reamer. This provided excellent circumferential chatter. The 47 mm trial acetabulum was positioned and found to fit quite  well. Therefore, the 48 mm acetabular shell was selected and impacted into place with care taken to maintain the appropriate version. The trial high wall liner was inserted.  Attention was redirected to the femoral side. A box osteotome was used to establish version before the canal was broached  sequentially beginning with a #7 broach and progressing to a #11 broach. This was left in place and several trial reductions performed using both a standard and laterally offset neck options, as well as the -3 mm, +3 mm, and +6 mm neck lengths. The permanent #11 offset femoral stem was impacted into place. A repeat trial reduction was performed using the +3 mm and +6 mm neck lengths. The +6 mm neck length demonstrated excellent stability both in extension and external rotation as well as with flexion to 90 and internal rotation beyond 70. It also was stable in the position of sleep. In addition, leg lengths appeared to be restored appropriately, both by reassessing the position of the right leg over the left, as well as by measuring the distance between the Steinmann pin and the drill bit. The 32 mm ceramic head with the +6 mm neck adapter construct was put together on the back table before being impacted onto the stem of the femoral component. The Morse taper locking mechanism was verified using manual distraction before the head was relocated and placed through a range of motion with the findings as described above.  The wound was copiously irrigated with bacitracin saline solution via the jet lavage system before the peri-incisional and pericapsular tissues were injected with 30 cc of 0.5% Sensorcaine with epinephrine and 20 cc of Exparel diluted out to 60 cc with normal saline to help with postoperative analgesia. The posterior flap was reapproximated to the posterior aspect of the greater trochanter using #2 Tycron interrupted sutures placed through drill holes. Several additional #2 Tycron interrupted sutures were used to reinforce this layer of closure. The iliotibial band was reapproximated using #1 Vicryl interrupted sutures before the gluteal fascia was closed using a running #1 Vicryl suture. At this point, 1 g of transexemic acid in 10 cc of normal saline was injected into the joint to help reduce  postoperative bleeding. The subcutaneous tissues were closed in several layers using 2-0 Vicryl interrupted sutures before the skin was closed using staples. A sterile occlusive dressing was applied to the wound before the patient was placed into an abduction wedge pillow. The patient was then rolled back into the supine position on her hospital bed before being awakened and returned to the recovery room in satisfactory condition after tolerating the procedure well.

## 2016-04-22 NOTE — H&P (Signed)
Paper H&P to be scanned into permanent record. H&P reviewed. No changes. 

## 2016-04-22 NOTE — Anesthesia Preprocedure Evaluation (Signed)
Anesthesia Evaluation  Patient identified by MRN, date of birth, ID band Patient awake    Reviewed: Allergy & Precautions, H&P , NPO status , Patient's Chart, lab work & pertinent test results  History of Anesthesia Complications (+) PONV and history of anesthetic complications  Airway Mallampati: III  TM Distance: <3 FB Neck ROM: Limited    Dental   Pulmonary former smoker,    Pulmonary exam normal        Cardiovascular Normal cardiovascular exam+ Valvular Problems/Murmurs      Neuro/Psych PSYCHIATRIC DISORDERS Depression    GI/Hepatic Neg liver ROS, GERD  Medicated and Controlled,diverticulosis   Endo/Other  negative endocrine ROS  Renal/GU negative Renal ROS  negative genitourinary   Musculoskeletal  (+) Arthritis , Osteoarthritis,    Abdominal Normal abdominal exam  (+)   Peds negative pediatric ROS (+)  Hematology  (+) anemia ,   Anesthesia Other Findings   Reproductive/Obstetrics                             Anesthesia Physical  Anesthesia Plan  ASA: III  Anesthesia Plan:    Post-op Pain Management:    Induction: Intravenous  Airway Management Planned: Oral ETT  Additional Equipment:   Intra-op Plan:   Post-operative Plan: Extubation in OR  Informed Consent: I have reviewed the patients History and Physical, chart, labs and discussed the procedure including the risks, benefits and alternatives for the proposed anesthesia with the patient or authorized representative who has indicated his/her understanding and acceptance.     Plan Discussed with: CRNA, Anesthesiologist and Surgeon  Anesthesia Plan Comments: (History of 3 lumbar laminectomies ...with instrumentation... Patient agrees to GOT)        Anesthesia Quick Evaluation

## 2016-04-22 NOTE — Progress Notes (Signed)
Patient transferred to floor from PACU. Patient is alert and oriented with family at bedside. Complaining of shooting pain down her right leg, but otherwise has no complaints. VSS: BP 149/81 mmHg  Pulse 74  Temp(Src) 97.7 F (36.5 C) (Oral)  Resp 18  SpO2 99% and she has good sensation and intact pulses to bilateral lower legs.

## 2016-04-22 NOTE — Transfer of Care (Signed)
Immediate Anesthesia Transfer of Care Note  Patient: Melody Ochoa  Procedure(s) Performed: Procedure(s): TOTAL HIP ARTHROPLASTY (Right)  Patient Location: PACU  Anesthesia Type:General  Level of Consciousness: awake and patient cooperative  Airway & Oxygen Therapy: Patient Spontanous Breathing and Patient connected to nasal cannula oxygen  Post-op Assessment: Report given to RN and Post -op Vital signs reviewed and stable  Post vital signs: Reviewed and stable  Last Vitals:  Filed Vitals:   04/22/16 0608  BP: 136/74  Pulse: 85  Temp: 36.7 C  Resp: 18    Last Pain:  Filed Vitals:   04/22/16 0614  PainSc: 3          Complications: No apparent anesthesia complications

## 2016-04-22 NOTE — NC FL2 (Signed)
[place] LEVEL OF CARE SCREENING TOOL     IDENTIFICATION  Patient Name: Melody Ochoa Birthdate: 24-Jan-1941 Sex: female Admission Date (Current Location): 04/22/2016  [place] and [place] Number:  Engineering geologist and Address:  [facility], [address] [place], [place] [address]      Provider Number: [id number]  Attending Physician Name and Address:  Corky Mull, MD  Relative Name and Phone Number:       Current Level of Care: Hospital Recommended Level of Care: [place] Prior Approval Number:    Date Approved/Denied:   PASRR Number:  ( [id number] )  Discharge Plan: SNF    Current Diagnoses: Patient Active Problem List   Diagnosis Date Noted  . Status post total replacement of right hip 04/22/2016  . Personal history of colonic polyps   . Back pain 11/22/2014  . UTI (lower urinary tract infection) 11/22/2014  . Sepsis ([place]) 11/22/2014  . Low back pain 11/22/2014  . Fever 11/22/2014  . Normocytic anemia 11/22/2014  . Spondylolisthesis of lumbar region 11/18/2014    Orientation RESPIRATION BLADDER Height & Weight     Self, Time, Situation, Place  O2 Continent Weight:   Height:     BEHAVIORAL SYMPTOMS/MOOD NEUROLOGICAL BOWEL NUTRITION STATUS   (none )  (none ) Continent Diet (Diet: Clear Liquid )  AMBULATORY STATUS COMMUNICATION OF NEEDS Skin   Extensive Assist Verbally Surgical wounds                       Personal Care Assistance Level of Assistance  Bathing, Feeding, Dressing Bathing Assistance: Limited assistance Feeding assistance: Independent Dressing Assistance: Limited assistance     Functional Limitations Info  Sight, Hearing, Speech Sight Info: Impaired Hearing Info: Adequate Speech Info: Adequate    SPECIAL CARE FACTORS FREQUENCY  PT (By licensed PT), OT (By licensed OT)     PT Frequency:  (5) OT Frequency:  (5)            Contractures      Additional  Factors Info  Code Status, Allergies Code Status Info:  (Full Code. ) Allergies Info:  (Aleve, Aspirin, Celebrex, Effexor, Ibuprofen, Vioxx, Other)           Current Medications (04/22/2016):  This is the current hospital active medication list Current Facility-Administered Medications  Medication Dose Route Frequency Provider Last Rate Last Dose  . acetaminophen (TYLENOL) tablet 650 mg  650 mg Oral Q6H PRN Corky Mull, MD       Or  . acetaminophen (TYLENOL) suppository 650 mg  650 mg Rectal Q6H PRN Corky Mull, MD      . acetaminophen (TYLENOL) tablet 1,000 mg  1,000 mg Oral Q6H Corky Mull, MD   1,000 mg at 04/22/16 1218  . bisacodyl (DULCOLAX) suppository 10 mg  10 mg Rectal Daily PRN Corky Mull, MD      . buPROPion [facility] SR) 12 hr tablet 200 mg  200 mg Oral BID Corky Mull, MD      . calcium-vitamin D (OSCAL WITH D) 500-200 MG-UNIT per tablet 1 tablet  1 tablet Oral Daily Corky Mull, MD   1 tablet at 04/22/16 1145  . ceFAZolin (ANCEF) 2-4 GM/100ML-% IVPB           . ceFAZolin (ANCEF) IVPB 2g/100 mL premix  2 g Intravenous Q6H Corky Mull, MD   2 g at 04/22/16 1319  .  cholecalciferol (VITAMIN D) tablet 1,000 Units  1,000 Units Oral Daily Corky Mull, MD   1,000 Units at 04/22/16 1145  . dextrose 5 % and 0.9 % NaCl with KCl 20 mEq/L infusion   Intravenous Continuous Corky Mull, MD 100 mL/hr at 04/22/16 1218    . diazepam (VALIUM) tablet 2 mg  2 mg Oral TID PRN Corky Mull, MD      . diphenhydrAMINE (BENADRYL) 12.5 MG/5ML elixir 12.5-25 mg  12.5-25 mg Oral Q4H PRN Corky Mull, MD      . docusate sodium (COLACE) capsule 100 mg  100 mg Oral BID Corky Mull, MD   100 mg at 04/22/16 1145  . [START ON 04/23/2016] DULoxetine (CYMBALTA) DR capsule 120 mg  120 mg Oral Daily Corky Mull, MD      . Derrill Memo ON 04/23/2016] enoxaparin (LOVENOX) injection 30 mg  30 mg Subcutaneous Q12H Corky Mull, MD      . ferrous sulfate tablet 325 mg  325 mg Oral Daily Corky Mull, MD   325 mg  at 04/22/16 1145  . gabapentin (NEURONTIN) capsule 600 mg  600 mg Oral QHS Corky Mull, MD      . HYDROmorphone (DILAUDID) injection 1-2 mg  1-2 mg Intravenous Q2H PRN Corky Mull, MD   1 mg at 04/22/16 1155  . magnesium hydroxide (MILK OF MAGNESIA) suspension 30 mL  30 mL Oral Daily PRN Corky Mull, MD      . meclizine (ANTIVERT) tablet 25 mg  25 mg Oral TID PRN Corky Mull, MD      . metoCLOPramide (REGLAN) tablet 5-10 mg  5-10 mg Oral Q8H PRN Corky Mull, MD       Or  . metoCLOPramide (REGLAN) injection 5-10 mg  5-10 mg Intravenous Q8H PRN Corky Mull, MD      . ondansetron [facility]) tablet 4 mg  4 mg Oral Q6H PRN Corky Mull, MD       Or  . ondansetron [facility]) injection 4 mg  4 mg Intravenous Q6H PRN Corky Mull, MD      . oxyCODONE (Oxy IR/ROXICODONE) immediate release tablet 5-10 mg  5-10 mg Oral Q3H PRN Corky Mull, MD      . Derrill Memo ON 04/23/2016] pantoprazole (PROTONIX) EC tablet 40 mg  40 mg Oral Daily Corky Mull, MD      . sodium phosphate (FLEET) 7-19 GM/118ML enema 1 enema  1 enema Rectal Once PRN Corky Mull, MD         Discharge Medications: Please see discharge summary for a list of discharge medications.  Relevant Imaging Results:  Relevant Lab Results:   Additional Information  (SSN: [id number])  Loralyn Freshwater, LCSW

## 2016-04-22 NOTE — Anesthesia Procedure Notes (Signed)
Procedure Name: Intubation Date/Time: 04/22/2016 7:51 AM Performed by: Rosaria Ferries, Ryah Cribb Pre-anesthesia Checklist: Patient identified, Emergency Drugs available, Suction available and Patient being monitored Patient Re-evaluated:Patient Re-evaluated prior to inductionOxygen Delivery Method: Circle system utilized Preoxygenation: Pre-oxygenation with 100% oxygen Intubation Type: IV induction Laryngoscope Size: Mac and 3 Grade View: Grade I Tube size: 7.0 mm Number of attempts: 1 Placement Confirmation: ETT inserted through vocal cords under direct vision Secured at: 21 cm Tube secured with: Tape Dental Injury: Teeth and Oropharynx as per pre-operative assessment

## 2016-04-23 ENCOUNTER — Encounter: Payer: Self-pay | Admitting: Surgery

## 2016-04-23 LAB — BASIC METABOLIC PANEL
Anion gap: 3 — ABNORMAL LOW (ref 5–15)
BUN: 9 mg/dL (ref 6–20)
CO2: 28 mmol/L (ref 22–32)
Calcium: 7.9 mg/dL — ABNORMAL LOW (ref 8.9–10.3)
Chloride: 103 mmol/L (ref 101–111)
Creatinine, Ser: 0.86 mg/dL (ref 0.44–1.00)
GFR calc Af Amer: >60 mL/min (ref >60)
GFR calc non Af Amer: >60 mL/min (ref >60)
Glucose, Bld: 125 mg/dL — ABNORMAL HIGH (ref 65–99)
Potassium: 4.2 mmol/L (ref 3.5–5.1)
Sodium: 134 mmol/L — ABNORMAL LOW (ref 135–145)

## 2016-04-23 LAB — CBC WITH DIFFERENTIAL/PLATELET
Basophils Absolute: 0 10*3/uL (ref 0–0.1)
Basophils Relative: 0 %
Eosinophils Absolute: 0.1 10*3/uL (ref 0–0.7)
Eosinophils Relative: 1 %
HCT: 27 % — ABNORMAL LOW (ref 35.0–47.0)
Hemoglobin: 9.1 g/dL — ABNORMAL LOW (ref 12.0–16.0)
Lymphocytes Relative: 9 %
Lymphs Abs: 0.8 10*3/uL — ABNORMAL LOW (ref 1.0–3.6)
MCH: 29.1 pg (ref 26.0–34.0)
MCHC: 33.7 g/dL (ref 32.0–36.0)
MCV: 86.2 fL (ref 80.0–100.0)
Monocytes Absolute: 0.9 10*3/uL (ref 0.2–0.9)
Monocytes Relative: 10 %
Neutro Abs: 6.8 10*3/uL — ABNORMAL HIGH (ref 1.4–6.5)
Neutrophils Relative %: 80 %
Platelets: 206 10*3/uL (ref 150–440)
RBC: 3.14 MIL/uL — ABNORMAL LOW (ref 3.80–5.20)
RDW: 13.7 % (ref 11.5–14.5)
WBC: 8.6 10*3/uL (ref 3.6–11.0)

## 2016-04-23 MED ORDER — TRAMADOL HCL 50 MG PO TABS
50.0000 mg | ORAL_TABLET | Freq: Four times a day (QID) | ORAL | Status: DC
  Administered 2016-04-23 – 2016-04-25 (×5): 50 mg via ORAL
  Filled 2016-04-23 (×6): qty 1

## 2016-04-23 NOTE — Clinical Social Work Note (Signed)
Clinical Social Work Assessment  Patient Details  Name: Melody Ochoa MRN: [id number] Date of Birth: [birth year]  Date of referral:  04/23/16               Reason for consult:  Facility Placement                Permission sought to share information with:  Chartered certified accountant granted to share information::  Yes, Verbal Permission Granted  Name::      Melody Ochoa::   Melody Ochoa   Relationship::     Contact Information:     Housing/Transportation Living arrangements for the past 2 months:  [place] of Information:  Patient, Spouse Patient Interpreter Needed:  None Criminal Activity/Legal Involvement Pertinent to Current Situation/Hospitalization:  No - Comment as needed Significant Relationships:  Spouse Lives with:  Spouse Do you feel safe going back to the place where you live?  Yes Need for family participation in patient care:  Yes (Comment)  Care giving concerns:  Patient lives in [place] with her husband Melody Ochoa.    Social Worker assessment / plan:  Holiday representative (CSW) received SNF consult. PT is recommending SNF. CSW attempted to meet with patient however she was on the way to the restroom. CSW contacted patient's husband Melody Ochoa. Per Melody Ochoa him and his wife live in [place]. Melody Ochoa reported that patient prefers to go home. CSW explained that PT is recommending SNF. Husband is agreeable to bed search in [facility]. CSW faxed out FL2 and presented bed offers to husband. Husband chose [place]. CSW also met with patient after speaking with her husband. Patient was pleasant and was laying in the bed. CSW discussed SNF and home health option. Patient is agreeable to going to [facility] if needed however if she progresses enough to go home she would rather do that.   Plan is for patient to D/C to [facility] Sunday 04/25/16 pending medical clearance. Per [facility] admissions coordinator at [facility] patient will go  to room 326. RN will call report at [phone]. CSW sent D/C Summary, FL2 and D/C Packet to [place] today via [place]. CSW also faxed H&P to [place]. If patient progresses enough with PT she will return home with home health. Patient reported that she plans on working hard with PT to get home. CSW will continue to follow and assist as needed.   Employment status:  Retired Forensic scientist:  Medicare PT Recommendations:  [place] / Referral to community resources:  [place]  Patient/Family's Response to care:  Patient and husband are agreeable for patient to go to [facility] Sunday if needed.   Patient/Family's Understanding of and Emotional Response to Diagnosis, Current Treatment, and Prognosis:  Patient and husband were pleasant and thanked CSW for visit.   Emotional Assessment Appearance:  Appears stated age Attitude/Demeanor/Rapport:    Affect (typically observed):  Accepting, Adaptable, Pleasant Orientation:  Oriented to Self, Oriented to Place, Oriented to  Time, Fluctuating Orientation (Suspected and/or reported Sundowners) Alcohol / Substance use:  Not Applicable Psych involvement (Current and /or in the community):  No (Comment)  Discharge Needs  Concerns to be addressed:  Discharge Planning Concerns Readmission within the last 30 days:  No Current discharge risk:  Dependent with Mobility Barriers to Discharge:  Continued Medical Work up   Elwyn Reach 04/23/2016, 12:42 PM

## 2016-04-23 NOTE — Clinical Social Work Placement (Signed)
   CLINICAL SOCIAL WORK PLACEMENT  NOTE  Date:  04/23/2016  Patient Details  Name: Melody Ochoa MRN: [id number] Date of Birth: [birth year]  Clinical Social Work is seeking post-discharge placement for this patient at the [place] level of care (*CSW will initial, date and re-position this form in  chart as items are completed):  Yes   Patient/family provided with [place] Work Department's list of facilities offering this level of care within the geographic area requested by the patient (or if unable, by the patient's family).  Yes   Patient/family informed of their freedom to choose among providers that offer the needed level of care, that participate in Medicare, Medicaid or managed care program needed by the patient, have an available bed and are willing to accept the patient.  Yes   Patient/family informed of [facility]'s ownership interest in [facility] and [facility], as well as of the fact that they are under no obligation to receive care at these facilities.  PASRR submitted to EDS on       PASRR number received on       Existing PASRR number confirmed on 04/23/16     FL2 transmitted to all facilities in geographic area requested by pt/family on 04/23/16     FL2 transmitted to all facilities within larger geographic area on       Patient informed that his/her managed care company has contracts with or will negotiate with certain facilities, including the following:        Yes   Patient/family informed of bed offers received.  Patient chooses bed at  [facility] )     Physician recommends and patient chooses bed at      Patient to be transferred to   on  .  Patient to be transferred to facility by       Patient family notified on   of transfer.  Name of family member notified:        PHYSICIAN       Additional Comment:    _______________________________________________ Loralyn Freshwater, LCSW 04/23/2016, 12:40  PM

## 2016-04-23 NOTE — Progress Notes (Signed)
Physical Therapy Treatment Patient Details Name: Melody Ochoa MRN: [id number] DOB: 17-Sep-1941 Today's Date: 04/23/2016    History of Present Illness Pt admitted for R THR posterior approach and is POD 0 at time of evaluation.     PT Comments    Pt with improved tolerance to activities this pm.  Pt con't to require modA x 1 supine to sit however demonstrated decreased posterior lean and no c/o dizziness. Pt modA x 1 sit to stand to [facility] and was able to tolerate several step from [facility] to recliner.  Pt able to tolerate some gentle seated therex with no c/o increased pain.  She would con't to benefit from skilled PT to increase functional mobility tolerance and safety with activities.   Follow Up Recommendations  SNF     Equipment Recommendations       Recommendations for Other Services       Precautions / Restrictions Precautions Precautions: Posterior Hip Precaution Booklet Issued: No Restrictions Weight Bearing Restrictions: Yes RLE Weight Bearing: Weight bearing as tolerated    Mobility  Bed Mobility Overal bed mobility: Needs Assistance Bed Mobility: Supine to Sit     Supine to sit: Mod assist     General bed mobility comments: Decreased posterior lean from am session, pt able to sit upright at EOB x 5 min   Transfers Overall transfer level: Needs assistance Equipment used: Rolling walker (2 wheeled) Transfers: Sit to/from Stand Sit to Stand: Mod assist         General transfer comment: Pt trf to [facility] with cues for sequencing and directional changes improved technique trf BSC to recliner chair   Ambulation/Gait Ambulation/Gait assistance: Mod assist Ambulation Distance (Feet): 4 Feet Assistive device: Rolling walker (2 wheeled) Gait Pattern/deviations: Step-to pattern     General Gait Details: Short, shuffling steps with decreased wt shift to R   Stairs            Wheelchair Mobility    Modified Rankin (Stroke Patients Only)       Balance  Overall balance assessment: Needs assistance Sitting-balance support: Bilateral upper extremity supported;Feet supported Sitting balance-Leahy Scale: Fair Sitting balance - Comments: Improved balance sitting at EOB decreased posterior lean noted    Standing balance support: Bilateral upper extremity supported Standing balance-Leahy Scale: Fair Standing balance comment: Pt able to acheive erect posture with cues                     Cognition Arousal/Alertness: Awake/alert Behavior During Therapy: WFL for tasks assessed/performed Overall Cognitive Status: Within Functional Limits for tasks assessed                      Exercises Other Exercises Other Exercises: LAQ, hip abd/add, pillow squeezes, ankle pumps x 10 b/l     General Comments        Pertinent Vitals/Pain Pain Assessment: 0-10 Pain Score: 6  Pain Location: R hip  Pain Descriptors / Indicators: Aching;Constant Pain Intervention(s): Limited activity within patient's tolerance;Monitored during session    Home Living                      Prior Function            PT Goals (current goals can now be found in the care plan section) Acute Rehab PT Goals Patient Stated Goal: to get stronger PT Goal Formulation: With patient Time For Goal Achievement: 05/06/16 Potential to Achieve Goals: Good  Frequency  BID    PT Plan      Co-evaluation             End of Session Equipment Utilized During Treatment: Gait belt Activity Tolerance: Patient tolerated treatment well;Patient limited by fatigue Patient left: in chair;with call bell/phone within reach;with chair alarm set     Time: 1415-1440 PT Time Calculation (min) (ACUTE ONLY): 25 min  Charges:  $Therapeutic Exercise: 8-22 mins $Therapeutic Activity: 8-22 mins                    G Codes:      Merlyn Conley 05/19/2016, 4:39 PM  Florabel Faulks, PTA

## 2016-04-23 NOTE — Progress Notes (Signed)
Physical Therapy Treatment Patient Details Name: Melody Ochoa MRN: [id number] DOB: 09-03-1941 Today's Date: 04/23/2016    History of Present Illness Pt admitted for R THR posterior approach and is POD 0 at time of evaluation.     PT Comments    Pt agreeable to therapy, unable to recall 3/3 hip precautions and provided con't edu on precautions. Pt required modA x1 supine to sit with cues for sequencing and with HOB elevated.  Upon sitting pt demonstrated posterior lean requiring multiple verbal and tactile cues to correct to neutral.  Pt attempted stand pivot transfer to recliner chair, able to stand modA x 1 however once in standing pt with increased dizziness and request to return to bed.  Pt returned to bed and able to tolerate supine therex.  She would con't to benefit from sklled PT to increased LE strength and functional mobility tolerance.    Follow Up Recommendations  SNF     Equipment Recommendations       Recommendations for Other Services       Precautions / Restrictions Precautions Precautions: Posterior Hip Precaution Booklet Issued: No Restrictions Weight Bearing Restrictions: Yes RLE Weight Bearing: Weight bearing as tolerated    Mobility  Bed Mobility Overal bed mobility: Needs Assistance Bed Mobility: Supine to Sit;Sit to Supine     Supine to sit: Mod assist Sit to supine: Mod assist   General bed mobility comments: Cues for hand placement and sequencing, at time difficulty following commands.  Moderate posterior lean when sitting at EOB, +2 assist for boosting to [employer]  Transfers Overall transfer level: Needs assistance Equipment used: Rolling walker (2 wheeled) Transfers: Sit to/from Stand Sit to Stand: Mod assist         General transfer comment: Pt able to stand with attempt for stand pivot to recliner however pt c/o increased dizziness upon standing and request to return to bed.   Ambulation/Gait                 Stairs             Wheelchair Mobility    Modified Rankin (Stroke Patients Only)       Balance Overall balance assessment: Needs assistance Sitting-balance support: Bilateral upper extremity supported;Feet supported Sitting balance-Leahy Scale: Fair Sitting balance - Comments: Pt required mod cues to maintain neutral as would demonstrate posterior lean    Standing balance support: Bilateral upper extremity supported Standing balance-Leahy Scale: Fair Standing balance comment: cues for erect posture no posterior lean                     Cognition Arousal/Alertness: Awake/alert Behavior During Therapy: WFL for tasks assessed/performed Overall Cognitive Status: Within Functional Limits for tasks assessed                      Exercises Other Exercises Other Exercises: Performed supine hip abd/add, SAQ, GS, QS, heel slides, ankle pumps, x10 b/l     General Comments        Pertinent Vitals/Pain Pain Assessment: 0-10 Pain Score: 7  Pain Location: R hip  Pain Descriptors / Indicators: Aching;Constant;Operative site guarding Pain Intervention(s): Limited activity within patient's tolerance;Monitored during session    Home Living                      Prior Function            PT Goals (current goals can now be found in  the care plan section) Acute Rehab PT Goals Patient Stated Goal: to get stronger PT Goal Formulation: With patient Time For Goal Achievement: 05/06/16 Potential to Achieve Goals: Good    Frequency  BID    PT Plan      Co-evaluation             End of Session Equipment Utilized During Treatment: Gait belt Activity Tolerance: Patient limited by pain;Other (comment) (increased c/o dizziness upon standing ) Patient left: in bed;with call bell/phone within reach;with bed alarm set     Time: 1000-1030 PT Time Calculation (min) (ACUTE ONLY): 30 min  Charges:  $Therapeutic Exercise: 8-22 mins $Therapeutic Activity: 8-22 mins                     G Codes:      Alice Burnside 2016-05-17, 12:17 PM  Zayah Keilman, PTA

## 2016-04-23 NOTE — Discharge Summary (Addendum)
Physician Discharge Summary  Patient ID: Melody Ochoa MRN: [id number] DOB/AGE: 1941/09/24 75 y.o.  Admit date: 04/22/2016 Discharge date: 04/25/2016  Admission Diagnoses:  PRIMARY OSTEOARTHRITIS OF RIGHT HIP Degenerative joint disease, right hip  Discharge Diagnoses: Patient Active Problem List   Diagnosis Date Noted  . Status post total replacement of right hip 04/22/2016  . Personal history of colonic polyps   . Back pain 11/22/2014  . UTI (lower urinary tract infection) 11/22/2014  . Sepsis ([place]) 11/22/2014  . Low back pain 11/22/2014  . Fever 11/22/2014  . Normocytic anemia 11/22/2014  . Spondylolisthesis of lumbar region 11/18/2014  Degenerative joint disease, right hip.  Past Medical History  Diagnosis Date  . Heart murmur   . Depression   . GERD (gastroesophageal reflux disease)   . Arthritis   . Cancer ([place])     skin  . Diverticulosis   . Dizziness     patient had episode of dizziness when came in room. hx past no dx  . PONV (postoperative nausea and vomiting)     yrs ago     Transfusion: None   Consultants (if any): Treatment Team:  Hillary Bow, MD  Discharged Condition: Improved  Hospital Course: Melody Ochoa is an 75 y.o. female who was admitted 04/22/2016 with a diagnosis of right hip degenerative joint disease and went to the operating room on 04/22/2016 and underwent the above named procedures.    Surgeries: Procedure(s): TOTAL HIP ARTHROPLASTY on 04/22/2016 Patient tolerated the surgery well. Taken to PACU where she was stabilized and then transferred to the orthopedic floor.  Started on Lovenox 30mg  q 12 hrs. Foot pumps applied bilaterally at 80 mm. Heels elevated on bed with rolled towels. No evidence of DVT. Negative Homan. Physical therapy started on day #1 for gait training and transfer. OT started day #1 for ADL and assisted devices.  Patient's IV and Foley were removed on POD1 Medicine consult obtained due to confusion/delirium.   No interventions planned, per internal medicine, patient is stable for discharge to SNF today.  Implants: Biomet press-fit system with a #11 laterally offset Echo femoral stem, a 48 mm acetabular shell with an E-poly hi-wall liner, and a 32 mm ceramic head with a +6 mm neck.  She was given perioperative antibiotics:      Anti-infectives    Start     Dose/Rate Route Frequency Ordered Stop   04/22/16 1400  ceFAZolin (ANCEF) IVPB 2g/100 mL premix     2 g 200 mL/hr over 30 Minutes Intravenous Every 6 hours 04/22/16 1130 04/23/16 0138   04/22/16 0641  ceFAZolin (ANCEF) 2-4 GM/100ML-% IVPB    Comments:  Rexanne Mano: cabinet override      04/22/16 0641 04/22/16 1844   04/22/16 0330  ceFAZolin (ANCEF) IVPB 2g/100 mL premix     2 g 200 mL/hr over 30 Minutes Intravenous  Once 04/22/16 0325 04/22/16 0816    .  She was given sequential compression devices, early ambulation, and Lovenox for DVT prophylaxis.  She benefited maximally from the hospital stay and there were no complications.    Recent vital signs:  Filed Vitals:   04/25/16 0445 04/25/16 0806  BP: 141/65 130/50  Pulse: 90 96  Temp: 98.5 F (36.9 C) 98.5 F (36.9 C)  Resp: 16     Recent laboratory studies:  Lab Results  Component Value Date   HGB 8.3* 04/25/2016   HGB 8.8* 04/24/2016   HGB 9.1* 04/23/2016   Lab Results  Component  Value Date   WBC 8.1 04/25/2016   PLT 171 04/25/2016   Lab Results  Component Value Date   INR 0.88 04/21/2016   Lab Results  Component Value Date   NA 131* 04/25/2016   K 3.5 04/25/2016   CL 98* 04/25/2016   CO2 28 04/25/2016   BUN 13 04/25/2016   CREATININE 0.82 04/25/2016   GLUCOSE 85 04/25/2016    Discharge Medications:     Medication List    TAKE these medications        buPROPion 200 MG 12 hr tablet  Commonly known as:  WELLBUTRIN SR  Take 200 mg by mouth 2 (two) times daily.     CALTRATE 600+D PO  Take 1 tablet by mouth daily.     diazepam 2 MG tablet   Commonly known as:  VALIUM  Take 2 mg by mouth 3 (three) times daily as needed for anxiety or muscle spasms.     DULoxetine 60 MG capsule  Commonly known as:  CYMBALTA  Take 120 mg by mouth daily.     enoxaparin 40 MG/0.4ML injection  Commonly known as:  LOVENOX  Inject 0.4 mLs (40 mg total) into the skin daily.     ferrous sulfate 325 (65 FE) MG tablet  Take 1 tablet (325 mg total) by mouth 3 (three) times daily with meals.     gabapentin 300 MG capsule  Commonly known as:  NEURONTIN  Take 600 mg by mouth at bedtime.     HYDROcodone-acetaminophen 10-325 MG tablet  Commonly known as:  NORCO  Take 0.5-1 tablets by mouth every 8 (eight) hours as needed.     meclizine 25 MG tablet  Commonly known as:  ANTIVERT  Take 1 tablet (25 mg total) by mouth 3 (three) times daily as needed for dizziness.     pantoprazole 40 MG tablet  Commonly known as:  PROTONIX  Take 40 mg by mouth daily.     traMADol 50 MG tablet  Commonly known as:  ULTRAM  Take 1-2 tablets (50-100 mg total) by mouth every 6 (six) hours.     VITAMIN D PO  Take 1 tablet by mouth daily.        Diagnostic Studies: Dg Chest 2 View  04/21/2016  CLINICAL DATA:  Right total hip joint replacement Jonelle Sidle; history of bronchitis and heart murmur, former smoker. EXAM: CHEST  2 VIEW COMPARISON:  Portable chest x-ray of November 19, 2014 FINDINGS: The right hemidiaphragm remains higher than the left. The interstitial markings of both lungs remain increased. There is no alveolar infiltrate. There is no pleural effusion or pneumothorax. The cardiac silhouette is normal in size. The pulmonary vascularity is not engorged. There is tortuosity of the descending thoracic aorta with calcification in the aortic arch. The patient has undergone previous lower cervical fusion procedures. There is multilevel degenerative disc disease of the thoracic spine. IMPRESSION: Chronically increased interstitial markings may reflect pulmonary fibrosis.  This has not progressed significantly since the previous study. There is no alveolar pneumonia nor CHF. Electronically Signed   By: David  Martinique M.D.   On: 04/21/2016 15:39   Mr Hip Right Wo Contrast  04/07/2016  CLINICAL DATA:  Bilateral hip pain for 1 year, left worse than right. No known injury. EXAM: MR OF THE RIGHT HIP WITHOUT CONTRAST TECHNIQUE: Multiplanar, multisequence MR imaging was performed. No intravenous contrast was administered. COMPARISON:  Initial encounter. FINDINGS: Bones: No fracture, avascular necrosis or worrisome marrow lesion is identified. Osteophytosis about  the femoral heads is much worse on the right. Small subchondral cysts are seen in the left acetabular roof. Artifact from lower lumbar fusion hardware is noted Articular cartilage and labrum Articular cartilage: Severely degenerated with associated joint space narrowing on the right. Milder degree of degenerative change on the left is identified. Labrum: The right labrum is diffusely degenerated and torn. A right paralabral cyst at the 12 o'clock position measures 1.3 cm in diameter. Joint or bursal effusion Joint effusion:  None. Bursae: Fluid is present in the trochanter bursae, greater on the left. Muscles and tendons Muscles and tendons:  Intact. Other findings Miscellaneous: Imaged intrapelvic contents demonstrate extensive sigmoid diverticulosis. IMPRESSION: Right worse than left hip osteoarthritis. Associated labral tearing on the right with paralabral cyst formation is noted. Bilateral trochanteric bursitis appears worse on the left. Sigmoid diverticulosis. Electronically Signed   By: Inge Rise M.D.   On: 04/07/2016 14:54   Dg Hip Port Unilat With Pelvis 1v Right  04/22/2016  CLINICAL DATA:  Status post right total hip arthroplasty EXAM: DG HIP (WITH OR WITHOUT PELVIS) 1V PORT RIGHT COMPARISON:  None. FINDINGS: Status post right total hip arthroplasty, with well-positioned right acetabular and right proximal  femoral prostheses. No right hip dislocation. No osseous fracture or focal osseous lesion. Soft tissue gas surrounds the right hip as expected in the immediate postoperative setting. Partially visualized surgical hardware in the lower lumbar spine. Skin staples are seen lateral to the right hip. IMPRESSION: Satisfactory appearance status post right total hip arthroplasty. Electronically Signed   By: Ilona Sorrel M.D.   On: 04/22/2016 12:44   Disposition:  Pt is medically and orthopaedically stable for discharge to SNF today.  Lovenox 40mg  daily for DVT prophylaxis and tramadol as needed for pain.  Pt did not tolerate oxycodone well, caused increased confusion.  Pt will follow-up in 10-14 days for staple removal.   Follow-up Information    Follow up with [facility] .   Specialties:  [place], [facility]   Contact information:   [place] [phone]      Follow up with Judson Roch, PA-C.   Specialty:  Physician Assistant   Why:  For suture removal, For wound re-check   Contact information:   [place] [address] [phone]      Signed: Judson Roch PA-C 04/25/2016, 9:06 AM

## 2016-04-23 NOTE — Progress Notes (Signed)
  Subjective: 1 Day Post-Op Procedure(s) (LRB): TOTAL HIP ARTHROPLASTY (Right) Patient reports pain as 8 on 0-10 scale.   Patient is well, and has had no acute complaints or problems Plan is to go Home after hospital stay. Negative for chest pain and shortness of breath Fever: no Gastrointestinal:negative for nausea and vomiting  Objective: Vital signs in last 24 hours: Temp:  [97.4 F (36.3 C)-99.7 F (37.6 C)] 99.7 F (37.6 C) (05/19 0424) Pulse Rate:  [73-106] 106 (05/19 0424) Resp:  [14-21] 16 (05/19 0424) BP: (101-158)/(55-88) 101/61 mmHg (05/19 0424) SpO2:  [94 %-100 %] 94 % (05/19 0424) Weight:  [88.905 kg (196 lb)] 88.905 kg (196 lb) (05/18 2009)  Intake/Output from previous day:  Intake/Output Summary (Last 24 hours) at 04/23/16 0747 Last data filed at 04/23/16 0445  Gross per 24 hour  Intake   3990 ml  Output   1260 ml  Net   2730 ml    Intake/Output this shift:    Labs:  Recent Labs  04/21/16 1300 04/23/16 0451  HGB 11.5* 9.1*    Recent Labs  04/21/16 1300 04/23/16 0451  WBC 6.3 8.6  RBC 3.92 3.14*  HCT 34.1* 27.0*  PLT 259 206    Recent Labs  04/21/16 1300 04/23/16 0451  NA 137 134*  K 4.3 4.2  CL 103 103  CO2 28 28  BUN 11 9  CREATININE 0.90 0.86  GLUCOSE 94 125*  CALCIUM 9.1 7.9*    Recent Labs  04/21/16 1300  INR 0.88     EXAM General - Patient is Alert, Disorganized and patient seems alittle dillusional this AM after recieving diluadid last pm. Extremity - Neurologically intact ABD soft Sensation intact distally Intact pulses distally Incision: moderate drainage Dressing/Incision - blood tinged drainage Motor Function - intact, moving foot and toes well on exam.   Abdomen soft with normal BS, without tympany.  Past Medical History  Diagnosis Date  . Heart murmur   . Depression   . GERD (gastroesophageal reflux disease)   . Arthritis   . Cancer ([place])     skin  . Diverticulosis   . Dizziness     patient had  episode of dizziness when came in room. hx past no dx  . PONV (postoperative nausea and vomiting)     yrs ago    Assessment/Plan: 1 Day Post-Op Procedure(s) (LRB): TOTAL HIP ARTHROPLASTY (Right) Active Problems:   Status post total replacement of right hip  Estimated body mass index is 33.63 kg/(m^2) as calculated from the following:   Height as of this encounter: 5\' 4"  (1.626 m).   Weight as of this encounter: 88.905 kg (196 lb). Advance diet Up with therapy D/C IV fluids when tolerating PO intake.  Labs reviewed.  Hg 9.1 this AM.  CBC and BMP ordered for tomorrow morning.  Continue to monitor for possible transfusion. Foley removed, urinating without difficulty. Needs to have BM prior to discharge. Pt is disorganized this am, answers questions appropriately but is talking about events that did not occur.  Pt received Dilaudid last night.  Spoke with nurse about attempting to use tramadol instead of oxycodone for pain relief.  DVT Prophylaxis - Lovenox, Foot Pumps and TED hose Weight-Bearing as tolerated to right leg  J. Cameron Proud, PA-C [facility] Orthopaedic Surgery 04/23/2016, 7:47 AM

## 2016-04-24 LAB — CBC
HCT: 26 % — ABNORMAL LOW (ref 35.0–47.0)
Hemoglobin: 8.8 g/dL — ABNORMAL LOW (ref 12.0–16.0)
MCH: 29 pg (ref 26.0–34.0)
MCHC: 33.9 g/dL (ref 32.0–36.0)
MCV: 85.5 fL (ref 80.0–100.0)
Platelets: 171 10*3/uL (ref 150–440)
RBC: 3.04 MIL/uL — ABNORMAL LOW (ref 3.80–5.20)
RDW: 13.5 % (ref 11.5–14.5)
WBC: 10.1 10*3/uL (ref 3.6–11.0)

## 2016-04-24 LAB — BASIC METABOLIC PANEL
Anion gap: 5 (ref 5–15)
BUN: 11 mg/dL (ref 6–20)
CO2: 26 mmol/L (ref 22–32)
Calcium: 8.1 mg/dL — ABNORMAL LOW (ref 8.9–10.3)
Chloride: 102 mmol/L (ref 101–111)
Creatinine, Ser: 0.77 mg/dL (ref 0.44–1.00)
GFR calc Af Amer: >60 mL/min (ref >60)
GFR calc non Af Amer: >60 mL/min (ref >60)
Glucose, Bld: 96 mg/dL (ref 65–99)
Potassium: 3.7 mmol/L (ref 3.5–5.1)
Sodium: 133 mmol/L — ABNORMAL LOW (ref 135–145)

## 2016-04-24 LAB — URINALYSIS COMPLETE WITH MICROSCOPIC (ARMC ONLY)
Bacteria, UA: NONE SEEN
Bilirubin Urine: NEGATIVE
Glucose, UA: 50 mg/dL — AB
Ketones, ur: NEGATIVE mg/dL
Leukocytes, UA: NEGATIVE
Nitrite: NEGATIVE
Protein, ur: 30 mg/dL — AB
Specific Gravity, Urine: 1.014 (ref 1.005–1.030)
pH: 7 (ref 5.0–8.0)

## 2016-04-24 NOTE — Progress Notes (Signed)
This Probation officer and NT found patient to have removed IV cath from right hand during toileting. Patient denies removing catheter and states we are liars and why do we ask her questions if we already know the answer

## 2016-04-24 NOTE — Progress Notes (Signed)
UA collected,sent, and results are neg. [place] aware and monitoring status of patient at this time.

## 2016-04-24 NOTE — Consult Note (Signed)
Reason for Consult: No chief complaint on file.  Referring Physician: dr.Poggie  Melody Ochoa is an 75 y.o. female.  HPI: Mrs. Melody Ochoa is a 75 year old female admitted to the hospital under orthopedic service and had right total hip arthroplasty on 04/22/2016. Patient was found to be confused postoperatively and hospitalist team is consulted regarding the same. During my examination patient is much awake and alert and reporting that she was coming more confused yesterday. According to the husband patient is doing much better today and patient was able to answer most of my questions except one or 2. She has reported that she is 75 year old but she was able to answer most of the questions accurately. Denies any chest pain or shortness of breath. No abdominal pain nausea vomiting  Past Medical History  Diagnosis Date  . Heart murmur   . Depression   . GERD (gastroesophageal reflux disease)   . Arthritis   . Cancer ([place])     skin  . Diverticulosis   . Dizziness     patient had episode of dizziness when came in room. hx past no dx  . PONV (postoperative nausea and vomiting)     yrs ago    Past Surgical History  Procedure Laterality Date  . Tonsillectomy    . Facelift  94  . Cervical disc surgery  09  . Back surgery  13  . Colonoscopy with propofol N/A 01/13/2016    Procedure: COLONOSCOPY WITH PROPOFOL;  Surgeon: [name], MD;  Location: [facility] ENDOSCOPY;  Service: Endoscopy;  Laterality: N/A;  . Total hip arthroplasty Right 04/22/2016    Procedure: TOTAL HIP ARTHROPLASTY;  Surgeon: Corky Mull, MD;  Location: [facility] ORS;  Service: Orthopedics;  Laterality: Right;    History reviewed. No pertinent family history.  Social History:  reports that she quit smoking about 19 years ago. Her smoking use included Cigarettes. She has a 40 pack-year smoking history. She does not have any smokeless tobacco history on file. She reports that she drinks alcohol. She reports that she does not use  illicit drugs.  Allergies:  Allergies  Allergen Reactions  . Aleve [Naproxen Sodium] Other (See Comments)    Gi upset  . Aspirin     Stomach pain-aggravates diverticulosis  . Celebrex [Celecoxib] Other (See Comments)    Dizziness   . Effexor [Venlafaxine] Other (See Comments)    Hot flashes   . Ibuprofen Other (See Comments)    Gi upset   . Vioxx [Rofecoxib] Other (See Comments)    Gi upset  . Other Rash    bandaides    Medications: I have reviewed the patient's current medications.  Results for orders placed or performed during the hospital encounter of 04/22/16 (from the past 48 hour(s))  CBC with Differential/Platelet     Status: Abnormal   Collection Time: 04/23/16  4:51 AM  Result Value Ref Range   WBC 8.6 3.6 - 11.0 K/uL   RBC 3.14 (L) 3.80 - 5.20 MIL/uL   Hemoglobin 9.1 (L) 12.0 - 16.0 g/dL   HCT 27.0 (L) 35.0 - 47.0 %   MCV 86.2 80.0 - 100.0 fL   MCH 29.1 26.0 - 34.0 pg   MCHC 33.7 32.0 - 36.0 g/dL   RDW 13.7 11.5 - 14.5 %   Platelets 206 150 - 440 K/uL   Neutrophils Relative % 80% %   Neutro Abs 6.8 (H) 1.4 - 6.5 K/uL   Lymphocytes Relative 9% %   Lymphs Abs 0.8 (L)  1.0 - 3.6 K/uL   Monocytes Relative 10% %   Monocytes Absolute 0.9 0.2 - 0.9 K/uL   Eosinophils Relative 1% %   Eosinophils Absolute 0.1 0 - 0.7 K/uL   Basophils Relative 0% %   Basophils Absolute 0.0 0 - 0.1 K/uL  Basic metabolic panel     Status: Abnormal   Collection Time: 04/23/16  4:51 AM  Result Value Ref Range   Sodium 134 (L) 135 - 145 mmol/L   Potassium 4.2 3.5 - 5.1 mmol/L   Chloride 103 101 - 111 mmol/L   CO2 28 22 - 32 mmol/L   Glucose, Bld 125 (H) 65 - 99 mg/dL   BUN 9 6 - 20 mg/dL   Creatinine, Ser 0.86 0.44 - 1.00 mg/dL   Calcium 7.9 (L) 8.9 - 10.3 mg/dL   GFR calc non Af Amer >60 >60 mL/min   GFR calc Af Amer >60 >60 mL/min    Comment: (NOTE) The eGFR has been calculated using the CKD EPI equation. This calculation has not been validated in all clinical  situations. eGFR's persistently <60 mL/min signify possible Chronic Kidney Disease.    Anion gap 3 (L) 5 - 15  Basic metabolic panel     Status: Abnormal   Collection Time: 04/24/16  4:14 AM  Result Value Ref Range   Sodium 133 (L) 135 - 145 mmol/L   Potassium 3.7 3.5 - 5.1 mmol/L   Chloride 102 101 - 111 mmol/L   CO2 26 22 - 32 mmol/L   Glucose, Bld 96 65 - 99 mg/dL   BUN 11 6 - 20 mg/dL   Creatinine, Ser 0.77 0.44 - 1.00 mg/dL   Calcium 8.1 (L) 8.9 - 10.3 mg/dL   GFR calc non Af Amer >60 >60 mL/min   GFR calc Af Amer >60 >60 mL/min    Comment: (NOTE) The eGFR has been calculated using the CKD EPI equation. This calculation has not been validated in all clinical situations. eGFR's persistently <60 mL/min signify possible Chronic Kidney Disease.    Anion gap 5 5 - 15  CBC     Status: Abnormal   Collection Time: 04/24/16  4:14 AM  Result Value Ref Range   WBC 10.1 3.6 - 11.0 K/uL   RBC 3.04 (L) 3.80 - 5.20 MIL/uL   Hemoglobin 8.8 (L) 12.0 - 16.0 g/dL   HCT 26.0 (L) 35.0 - 47.0 %   MCV 85.5 80.0 - 100.0 fL   MCH 29.0 26.0 - 34.0 pg   MCHC 33.9 32.0 - 36.0 g/dL   RDW 13.5 11.5 - 14.5 %   Platelets 171 150 - 440 K/uL  Urinalysis complete, with microscopic ([facility] only)     Status: Abnormal   Collection Time: 04/24/16 11:07 AM  Result Value Ref Range   Color, Urine YELLOW (A) YELLOW   APPearance CLEAR (A) CLEAR   Glucose, UA 50 (A) NEGATIVE mg/dL   Bilirubin Urine NEGATIVE NEGATIVE   Ketones, ur NEGATIVE NEGATIVE mg/dL   Specific Gravity, Urine 1.014 1.005 - 1.030   Hgb urine dipstick 1+ (A) NEGATIVE   pH 7.0 5.0 - 8.0   Protein, ur 30 (A) NEGATIVE mg/dL   Nitrite NEGATIVE NEGATIVE   Leukocytes, UA NEGATIVE NEGATIVE   RBC / HPF 6-30 0 - 5 RBC/hpf   WBC, UA 0-5 0 - 5 WBC/hpf   Bacteria, UA NONE SEEN NONE SEEN   Squamous Epithelial / LPF 0-5 (A) NONE SEEN   Mucous PRESENT  No results found.  ROS:  CONSTITUTIONAL: Denies fevers, chills. Denies any fatigue,  weakness.  EYES: Denies blurry vision, double vision, eye pain. EARS, NOSE, THROAT: Denies tinnitus, ear pain, hearing loss. RESPIRATORY: Denies cough, wheeze, shortness of breath.  CARDIOVASCULAR: Denies chest pain, palpitations, edema.  GASTROINTESTINAL: Denies nausea, vomiting, diarrhea, abdominal pain. Denies bright red blood per rectum. GENITOURINARY: Denies dysuria, hematuria. ENDOCRINE: Denies nocturia or thyroid problems. HEMATOLOGIC AND LYMPHATIC: Denies easy bruising or bleeding. SKIN: Denies rash or lesion. MUSCULOSKELETAL: Reporting right hip pain.Denies pain in neck,shoulder, knees. Reports chronic low back pain NEUROLOGIC: Denies paralysis, paresthesias.  PSYCHIATRIC: Denies anxiety or depressive symptoms. Blood pressure 142/63, pulse 91, temperature 98.6 F (37 C), temperature source Oral, resp. rate 17, height '5\' 4"'$  (1.626 m), weight 88.905 kg (196 lb), SpO2 100 %.   PHYSICAL EXAMINATION:  GENERAL: Well-nourished, well-developed currently in no acute distress.  HEAD: Normocephalic, atraumatic.  EYES: Pupils equal, round, and reactive to light. Extraocular muscles intact. No scleral icterus.  MOUTH: Moist mucosal membranes. Dentition intact. No abscess noted. EARS, NOSE, THROAT: Clear without exudates. No external lesions.  NECK: Supple. No thyromegaly. No nodules. No JVD.  PULMONARY: Clear to auscultation bilaterally without wheezes, rales, or rhonchi. No use of accessory muscles. Good respiratory effort. CHEST: Nontender to palpation.  CARDIOVASCULAR: S1, S2, regular rate and rhythm. No murmurs, rubs, or gallops.  GASTROINTESTINAL: Soft, nontender, nondistended. No masses. Positive bowel sounds. No hepatosplenomegaly. MUSCULOSKELETAL: right hip area with honeycomb dressing which is slightly soiled with blood, no active bleeding, tender to touch. Range of motion full in all  Other extremities. NEUROLOGIC: Cranial nerves II-XII intact. No gross focal neurological deficits.  Sensation intact. Reflexes intact. SKIN: No ulcerations, lesions, rash, cyanosis. Skin warm, dry. Turgor intact. PSYCHIATRIC: Mood, affect within normal limits. Patient awake, alert, oriented x 3. Insight and judgment intact.   Assessment/Plan:   #Altered mental status probably from anesthesia induced  Currently resolved and at her baseline answering questions appropriately and following verbal commands Urinalysis is negative. Will get neuro checks for the next 24 hours No other interventions are needed at this time  #Right hip pain status post right total hip arthroplasty Management per ortho  #Chronic history of anemia Check CBC in a.m. hemoglobin at 8.8 today  #Chronic low back pain and pain management as needed will be provided  Management and plan of care was discussed in detail with the patient and her husband at bedside. They verbalized understanding of the plan We will sign off as patient is mentating at her baseline Thank you Dr. Rae Mar for consulting hospitalist team, we will sign off at this time as the patient is mentating at her baseline and please feel free to call us  with any questions  TOTAL TIME TAKING CARE OF THIS PATIENT: 41 minutes.  '@[facility]'$ @ Pager - [phone] 04/24/2016, 7:12 PM

## 2016-04-24 NOTE — Progress Notes (Signed)
Patient found this am to be "odd" Worried about nurse speaking with doctor and PA r/t condition. 1-2 person max assist for bsc.

## 2016-04-24 NOTE — Progress Notes (Signed)
Physical Therapy Treatment Patient Details Name: Melody Ochoa MRN: [id number] DOB: Feb 26, 1941 Today's Date: 04/24/2016    History of Present Illness Pt admitted for R THR posterior approach. Pt has been largely limited in mobility since surgery by pain. Hb is down 11.5 to 8.8.    PT Comments    Pt tolerating treatment session well, motivated and able to complete entire PT sesssion as planned. Pt continues to make progress toward goals as evidenced by improved strength and tolerance to HEP. Pt's greatest limitation continues to be functional weakness, pain, and instability during transfers and gait, which continues to limit ability to perform ADL, IADL at baseline function. RN reports the patient was awake most of night and she has impulsively been trying to exit the bed repeatedly, in spite of safety reminders from RN. During session, the patient talks at length about being awake all night by PT doing exercises. She also makes reference to two different PTs who just left the room,' which is strongly suspected to be confabulatory. She also mentioned a little boy 'without language skills' who has been visiting her in the room. The patient is noted to be with pallor today, yawning with activity, and quite weak. Her Hb is noted to have declined since surgery, but she also has not been sleeping very much. Patient presenting with impairment of strength, pain, range of motion, balance, and activity tolerance, limiting ability to perform ADL and mobility tasks at  baseline level of function. Patient will benefit from skilled intervention to address the above impairments and limitations, in order to restore to prior level of function, improve patient safety upon discharge, and to decrease caregiver burden.    Follow Up Recommendations  SNF     Equipment Recommendations       Recommendations for Other Services       Precautions / Restrictions Precautions Precautions: Posterior  Hip;Fall Restrictions Weight Bearing Restrictions: Yes RLE Weight Bearing: Weight bearing as tolerated    Mobility  Bed Mobility Overal bed mobility: Needs Assistance Bed Mobility: Supine to Sit     Supine to sit: Mod assist Sit to supine: Mod assist      Transfers Overall transfer level: Needs assistance Equipment used: None (no RW in room. ) Transfers: Stand Pivot Transfers   Stand pivot transfers: Max assist       General transfer comment: RLE buckling while trying to step, with physical support from PT; does nto follow commands for pivot tranfer and is impulsive, sitting without cue.   Ambulation/Gait                 Stairs            Wheelchair Mobility    Modified Rankin (Stroke Patients Only)       Balance     Sitting balance-Leahy Scale: Fair       Standing balance-Leahy Scale: Zero                      Cognition Arousal/Alertness: Awake/alert Behavior During Therapy: WFL for tasks assessed/performed Overall Cognitive Status: Impaired/Different from baseline Area of Impairment:  (The patient is confabulating and describing what may be visual hallucinating.)     Memory: Decreased short-term memory              Exercises Other Exercises Other Exercises: RLE Heel Slides x10 with mod physical assist Other Exercises: RLE quad sets x10 with mod physical assist Other Exercises: RLE abduction/adduction heel slides x10 with  mod physical assist Other Exercises: RLE SAQ x10 with mod physical assist Other Exercises: ankle pumps x15    General Comments        Pertinent Vitals/Pain Pain Assessment: Faces Pain Score:  (does not repond to rating questions. ) Faces Pain Scale: Hurts little more Pain Location: R HIp  Pain Intervention(s): Limited activity within patient's tolerance;Monitored during session;Premedicated before session    Home Living                      Prior Function            PT Goals  (current goals can now be found in the care plan section) Acute Rehab PT Goals Patient Stated Goal: to get stronger PT Goal Formulation: With patient Time For Goal Achievement: 05/06/16 Potential to Achieve Goals: Good Additional Goals Additional Goal #1: Pt will be able to perform bed mobility/transfers with supervision and safe technique in order to improve functional independence Progress towards PT goals: Progressing toward goals    Frequency  BID    PT Plan Current plan remains appropriate    Co-evaluation             End of Session Equipment Utilized During Treatment: Gait belt Activity Tolerance: Patient tolerated treatment well;Patient limited by fatigue;Patient limited by pain;Patient limited by lethargy Patient left: in chair;with call bell/phone within reach;with chair alarm set;with family/visitor present     Time: 1005-1029 PT Time Calculation (min) (ACUTE ONLY): 24 min  Charges:  $Therapeutic Exercise: 8-22 mins $Therapeutic Activity: 8-22 mins                    G Codes:      11:02 AM, 05/14/2016 Etta Grandchild, PT, DPT PRN Physical Therapist - [place] License # [id number] ([phone] (mobile)

## 2016-04-24 NOTE — Progress Notes (Signed)
Spoke with Dr. Rudene Christians. Pt with frequent urination tonight and putting out small amounts of urine at a time. Bladder scan showing 624ml. Dr. Rudene Christians order to place Foley catheter and start bladder training.

## 2016-04-24 NOTE — Progress Notes (Signed)
Pt. Is very confused this shift. Attempting to get out of bed multiple time. Pt moved to 150 closer to nurses station. Pulled out Foley this morning.

## 2016-04-24 NOTE — Progress Notes (Signed)
  Subjective: 2 Days Post-Op Procedure(s) (LRB): TOTAL HIP ARTHROPLASTY (Right) Patient reports pain as 6 on 0-10 scale.   Patient is well, but was confused last night. Plan is to go Home after hospital stay. Negative for chest pain and shortness of breath Fever: no Gastrointestinal:negative for nausea and vomiting  Objective: Vital signs in last 24 hours: Temp:  [98.2 F (36.8 C)-100 F (37.8 C)] 100 F (37.8 C) (05/20 0446) Pulse Rate:  [88-102] 102 (05/20 0446) Resp:  [16-18] 16 (05/20 0446) BP: (119-157)/(56-80) 157/70 mmHg (05/20 0446) SpO2:  [93 %-100 %] 99 % (05/20 0446)  Intake/Output from previous day:  Intake/Output Summary (Last 24 hours) at 04/24/16 0852 Last data filed at 04/24/16 0500  Gross per 24 hour  Intake 1288.33 ml  Output   1950 ml  Net -661.67 ml    Intake/Output this shift:    Labs:  Recent Labs  04/21/16 1300 04/23/16 0451 04/24/16 0414  HGB 11.5* 9.1* 8.8*    Recent Labs  04/23/16 0451 04/24/16 0414  WBC 8.6 10.1  RBC 3.14* 3.04*  HCT 27.0* 26.0*  PLT 206 171    Recent Labs  04/23/16 0451 04/24/16 0414  NA 134* 133*  K 4.2 3.7  CL 103 102  CO2 28 26  BUN 9 11  CREATININE 0.86 0.77  GLUCOSE 125* 96  CALCIUM 7.9* 8.1*    Recent Labs  04/21/16 1300  INR 0.88     EXAM General - Patient is Alert, Disorganized and slighly confused this AM.  Pt removed foley on her own last night. Extremity - Neurologically intact ABD soft Sensation intact distally Intact pulses distally Incision: dressing C/D/I Dressing/Incision - Dressing clean, dry and without drainage. Motor Function - intact, moving foot and toes well on exam.   Abdomen soft with normal BS, without tympany.  Complains on mild pain over the pubis.  Past Medical History  Diagnosis Date  . Heart murmur   . Depression   . GERD (gastroesophageal reflux disease)   . Arthritis   . Cancer ([place])     skin  . Diverticulosis   . Dizziness     patient had  episode of dizziness when came in room. hx past no dx  . PONV (postoperative nausea and vomiting)     yrs ago    Assessment/Plan: 2 Days Post-Op Procedure(s) (LRB): TOTAL HIP ARTHROPLASTY (Right) Active Problems:   Status post total replacement of right hip  Estimated body mass index is 33.63 kg/(m^2) as calculated from the following:   Height as of this encounter: 5\' 4"  (1.626 m).   Weight as of this encounter: 88.905 kg (196 lb). Advance diet Up with therapy   Labs reviewed.  Hg 8.8 this AM.  CBC and BMP ordered for tomorrow morning.  Continue to monitor for possible transfusion. Pt having increased urination, will obtain urinalysis for evaluation of uti. Na 133, will place on fluid restricted diet. Needs to have BM prior to discharge.  FLEET enema on board. Pt is disorganized this am, answers questions appropriately but is talking about events that did not occur.  Pt received valium last night.  Using tylenol for pain relief.  Plan will be for possible discharge to SNF tomorrow.  DVT Prophylaxis - Lovenox, Foot Pumps and TED hose Weight-Bearing as tolerated to right leg  J. Cameron Proud, PA-C [facility] 04/24/2016, 8:52 AM

## 2016-04-24 NOTE — Progress Notes (Signed)
Physical Therapy Treatment Patient Details Name: Melody Ochoa MRN: [id number] DOB: 1941-01-31 Today's Date: 04/24/2016    History of Present Illness Pt admitted for R THR posterior approach. Pt has been largely limited in mobility since surgery by pain. Hb is down 11.5 to 8.8. today patient has been lethargic and fatigued, attempting to sleep most of day between PT sessions.     PT Comments    Pt continues to not feel very well, with continued malaise. She asks to defer PT session, but is agreeable eventually to performing some of her HEP in bed. Pt tolerating treatment session, however pain seems to be worse with exercises compared to this morning, and confusion seems to be worse as well, now with auditory hallucinations. At one point she asks if I asked her 'did you take a tour in [place]?' after a 15 second period of silence. She hears nursing staff in hallways and is convinced that they are PTs from [facility], although she then refers to her room as her home bed room. Progress toward goals is difficult to assess this session as patient is not feeling as well as she was this morning.    Follow Up Recommendations  SNF     Equipment Recommendations       Recommendations for Other Services       Precautions / Restrictions Precautions Precautions: Posterior Hip;Fall Precaution Booklet Issued: No Restrictions Weight Bearing Restrictions: Yes RLE Weight Bearing: Weight bearing as tolerated    Mobility  Bed Mobility               General bed mobility comments: Pt not feeling well, askes to defer PT session, but is agreeable to work on bed exercises.   Transfers                    Ambulation/Gait                 Stairs            Wheelchair Mobility    Modified Rankin (Stroke Patients Only)       Balance                                    Cognition Arousal/Alertness: Lethargic Behavior During Therapy: WFL for tasks  assessed/performed (Confabulation, and confused. Some hallucinations visual and auditory. ) Overall Cognitive Status: Impaired/Different from baseline Area of Impairment:  (short term memory intact, but stil confused about who is in the room, where she is and, )                    Exercises Total Joint Exercises Ankle Circles/Pumps: Right;20 reps;AROM;Supine Short Arc Quad: AROM;Right;Supine;15 reps Heel Slides: 15 reps;Supine;Right;AROM Hip ABduction/ADduction: AROM;Right;15 reps;Supine Bridges: AROM;Both;15 reps;Supine    General Comments        Pertinent Vitals/Pain Pain Assessment: 0-10 Pain Score: 6  Pain Location: R hip  Pain Descriptors / Indicators: Operative site guarding Pain Intervention(s): Limited activity within patient's tolerance;Monitored during session;Premedicated before session    Home Living                      Prior Function            PT Goals (current goals can now be found in the care plan section) Acute Rehab PT Goals Patient Stated Goal: to get stronger PT Goal Formulation: With patient  Time For Goal Achievement: 05/06/16 Potential to Achieve Goals: Good Additional Goals Additional Goal #1: Pt will be able to perform bed mobility/transfers with supervision and safe technique in order to improve functional independence Progress towards PT goals: PT to reassess next treatment    Frequency  BID    PT Plan Current plan remains appropriate    Co-evaluation             End of Session Equipment Utilized During Treatment: Gait belt Activity Tolerance: Patient tolerated treatment well;Patient limited by fatigue;Patient limited by pain;Patient limited by lethargy Patient left: with call bell/phone within reach;in bed;with bed alarm set     Time: [id number] PT Time Calculation (min) (ACUTE ONLY): 10 min  Charges:  $Therapeutic Exercise: 8-22 mins                    G Codes:      5:02 PM, 2016/05/23 Etta Grandchild,  PT, DPT PRN Physical Therapist - [place] License # [id number] [phone] (mobile)

## 2016-04-25 LAB — BASIC METABOLIC PANEL
Anion gap: 5 (ref 5–15)
BUN: 13 mg/dL (ref 6–20)
CO2: 28 mmol/L (ref 22–32)
Calcium: 7.9 mg/dL — ABNORMAL LOW (ref 8.9–10.3)
Chloride: 98 mmol/L — ABNORMAL LOW (ref 101–111)
Creatinine, Ser: 0.82 mg/dL (ref 0.44–1.00)
GFR calc Af Amer: >60 mL/min (ref >60)
GFR calc non Af Amer: >60 mL/min (ref >60)
Glucose, Bld: 85 mg/dL (ref 65–99)
Potassium: 3.5 mmol/L (ref 3.5–5.1)
Sodium: 131 mmol/L — ABNORMAL LOW (ref 135–145)

## 2016-04-25 LAB — CBC
HCT: 24.2 % — ABNORMAL LOW (ref 35.0–47.0)
Hemoglobin: 8.3 g/dL — ABNORMAL LOW (ref 12.0–16.0)
MCH: 29.2 pg (ref 26.0–34.0)
MCHC: 34.3 g/dL (ref 32.0–36.0)
MCV: 85 fL (ref 80.0–100.0)
Platelets: 171 10*3/uL (ref 150–440)
RBC: 2.85 MIL/uL — ABNORMAL LOW (ref 3.80–5.20)
RDW: 13.4 % (ref 11.5–14.5)
WBC: 8.1 10*3/uL (ref 3.6–11.0)

## 2016-04-25 MED ORDER — ENOXAPARIN SODIUM 40 MG/0.4ML ~~LOC~~ SOLN: 40.0000 mg | SUBCUTANEOUS | Status: DC

## 2016-04-25 MED ORDER — TRAMADOL HCL 50 MG PO TABS: 50.0000 mg | ORAL_TABLET | Freq: Four times a day (QID) | ORAL | Status: DC

## 2016-04-25 NOTE — Anesthesia Postprocedure Evaluation (Signed)
Anesthesia Post Note  Patient: Melody Ochoa  Procedure(s) Performed: Procedure(s) (LRB): TOTAL HIP ARTHROPLASTY (Right)  Patient location during evaluation: PACU Anesthesia Type: General Level of consciousness: awake and alert and oriented Pain management: pain level controlled Vital Signs Assessment: post-procedure vital signs reviewed and stable Respiratory status: spontaneous breathing Cardiovascular status: blood pressure returned to baseline Anesthetic complications: no    Last Vitals:  Filed Vitals:   04/25/16 0445 04/25/16 0806  BP: 141/65 130/50  Pulse: 90 96  Temp: 36.9 C 36.9 C  Resp: 16     Last Pain:  Filed Vitals:   04/25/16 0835  PainSc: 0-No pain                 Britlyn Martine

## 2016-04-25 NOTE — Progress Notes (Signed)
Subjective: 3 Days Post-Op Procedure(s) (LRB): TOTAL HIP ARTHROPLASTY (Right) Patient reports pain as mild.   Patient is well, cognitive abilities appear improved this AM. Plan is to go Skilled nursing facility after hospital stay. Negative for chest pain and shortness of breath Fever: no Gastrointestinal:negative for nausea and vomiting  Objective: Vital signs in last 24 hours: Temp:  [97.8 F (36.6 C)-98.7 F (37.1 C)] 98.5 F (36.9 C) (05/21 0806) Pulse Rate:  [90-102] 96 (05/21 0806) Resp:  [16-18] 16 (05/21 0445) BP: (130-143)/(50-76) 130/50 mmHg (05/21 0806) SpO2:  [94 %-100 %] 100 % (05/21 0806)  Intake/Output from previous day:  Intake/Output Summary (Last 24 hours) at 04/25/16 0843 Last data filed at 04/25/16 0445  Gross per 24 hour  Intake    650 ml  Output   1050 ml  Net   -400 ml    Intake/Output this shift:    Labs:  Recent Labs  04/23/16 0451 04/24/16 0414 04/25/16 0534  HGB 9.1* 8.8* 8.3*    Recent Labs  04/24/16 0414 04/25/16 0534  WBC 10.1 8.1  RBC 3.04* 2.85*  HCT 26.0* 24.2*  PLT 171 171    Recent Labs  04/24/16 0414 04/25/16 0534  NA 133* 131*  K 3.7 3.5  CL 102 98*  CO2 26 28  BUN 11 13  CREATININE 0.77 0.82  GLUCOSE 96 85  CALCIUM 8.1* 7.9*   No results for input(s): LABPT, INR in the last 72 hours.   EXAM General - Patient is Alert, Disorganized and cognitive abilities appear improved this AM. Extremity - Neurologically intact ABD soft Sensation intact distally Intact pulses distally Incision: scant drainage Dressing/Incision - Mild bloody drainage present to the right hip. Motor Function - intact, moving foot and toes well on exam.   Abdomen soft with normal BS, without tympany.  No complaints of pain this AM.  Past Medical History  Diagnosis Date  . Heart murmur   . Depression   . GERD (gastroesophageal reflux disease)   . Arthritis   . Cancer ([place])     skin  . Diverticulosis   . Dizziness     patient  had episode of dizziness when came in room. hx past no dx  . PONV (postoperative nausea and vomiting)     yrs ago    Assessment/Plan: 3 Days Post-Op Procedure(s) (LRB): TOTAL HIP ARTHROPLASTY (Right) Active Problems:   Status post total replacement of right hip  Estimated body mass index is 33.63 kg/(m^2) as calculated from the following:   Height as of this encounter: 5\' 4"  (1.626 m).   Weight as of this encounter: 88.905 kg (196 lb). Advance diet Up with therapy Discharge to SNF   Labs reviewed.  Hg 8.3this AM and is stable.  No lightheadedness or dizziness Urinalysis negative for any signs of UTI. Na 131, likely diet related.  Internal Medicine does not believe this is contributing to any mental status changes. Pt and nurse report bowel movement earlier this AM.  Normal BS without tympany. Internal med consulted for confusion/delirium.  Determined that no intervention is needed at this time. Using tylenol for pain relief. Discharge to SNF today.  Per internal medicine, patient is stable to be discharged from the medical standpoint.  DVT Prophylaxis - Lovenox, Foot Pumps and TED hose Weight-Bearing as tolerated to right leg  J. Cameron Proud, PA-C [facility] 04/25/2016, 8:43 AM

## 2016-04-25 NOTE — Progress Notes (Signed)
Patient discharge summary and medications reviewed with patient with verbal understanding. Dressing changed per order. EMS called for transport. Report given to [place] at [facility], going to room 326

## 2016-04-25 NOTE — Clinical Social Work Note (Signed)
Pt is ready for discharge today. Pt will go to [facility]. Pt and husband are aware and agreeable to discharge plan. Facility is ready to admit pt as they have received discharge information on Friday. RN to call report and EMS will provide transportation. CSW is signing off as no further needs identified.   Darden Dates, MSW, LCSW  Clinical Social Worker  [phone]

## 2016-04-25 NOTE — Clinical Social Work Placement (Signed)
   CLINICAL SOCIAL WORK PLACEMENT  NOTE  Date:  04/25/2016  Patient Details  Name: Melody Ochoa MRN: [id number] Date of Birth: [birth year]  Clinical Social Work is seeking post-discharge placement for this patient at the [place] level of care (*CSW will initial, date and re-position this form in  chart as items are completed):  Yes   Patient/family provided with [place] Work Department's list of facilities offering this level of care within the geographic area requested by the patient (or if unable, by the patient's family).  Yes   Patient/family informed of their freedom to choose among providers that offer the needed level of care, that participate in Medicare, Medicaid or managed care program needed by the patient, have an available bed and are willing to accept the patient.  Yes   Patient/family informed of [place]'s ownership interest in [facility] and [facility], as well as of the fact that they are under no obligation to receive care at these facilities.  PASRR submitted to EDS on       PASRR number received on       Existing PASRR number confirmed on 04/23/16     FL2 transmitted to all facilities in geographic area requested by pt/family on 04/23/16     FL2 transmitted to all facilities within larger geographic area on       Patient informed that his/her managed care company has contracts with or will negotiate with certain facilities, including the following:        Yes   Patient/family informed of bed offers received.  Patient chooses bed at  [facility] )     Physician recommends and patient chooses bed at  [facility])    Patient to be transferred to [facility] on 04/25/16.  Patient to be transferred to facility by [facility]     Patient family notified on 04/25/16 of transfer.  Name of family member notified:  Pt's husband     PHYSICIAN       Additional Comment:     _______________________________________________ Darden Dates, LCSW 04/25/2016, 10:08 AM

## 2016-04-25 NOTE — Discharge Instructions (Signed)

## 2016-04-25 NOTE — Progress Notes (Signed)
[facility] NAME: Melody Ochoa    MR#:  [id number]  DATE OF BIRTH:  02/07/1941  SUBJECTIVE:   Pt. Here due to right hip fracture and s/p right hip ORIF POD # 3.  Mental status improved and no acute complaints presently.   REVIEW OF SYSTEMS:    Review of Systems  Constitutional: Negative for fever and chills.  HENT: Negative for congestion and tinnitus.   Eyes: Negative for blurred vision and double vision.  Respiratory: Negative for cough, shortness of breath and wheezing.   Cardiovascular: Negative for chest pain, orthopnea and PND.  Gastrointestinal: Negative for nausea, vomiting, abdominal pain and diarrhea.  Genitourinary: Negative for dysuria and hematuria.  Neurological: Negative for dizziness, sensory change and focal weakness.  All other systems reviewed and are negative.   Nutrition: Renal diet.  Tolerating Diet: Yes Tolerating PT: Eval noted   DRUG ALLERGIES:   Allergies  Allergen Reactions  . Aleve [Naproxen Sodium] Other (See Comments)    Gi upset  . Aspirin     Stomach pain-aggravates diverticulosis  . Celebrex [Celecoxib] Other (See Comments)    Dizziness   . Effexor [Venlafaxine] Other (See Comments)    Hot flashes   . Ibuprofen Other (See Comments)    Gi upset   . Vioxx [Rofecoxib] Other (See Comments)    Gi upset  . Other Rash    bandaides    VITALS:  Blood pressure 130/50, pulse 96, temperature 98.5 F (36.9 C), temperature source Oral, resp. rate 16, height 5\' 4"  (1.626 m), weight 88.905 kg (196 lb), SpO2 100 %.  PHYSICAL EXAMINATION:   Physical Exam  GENERAL:  75 y.o.-year-old patient lying in the bed in no acute distress.  EYES: Pupils equal, round, reactive to light and accommodation. No scleral icterus. Extraocular muscles intact.  HEENT: Head atraumatic, normocephalic. Oropharynx and nasopharynx clear.  NECK:  Supple, no jugular venous distention. No thyroid enlargement, no  tenderness.  LUNGS: Normal breath sounds bilaterally, no wheezing, rales, rhonchi. No use of accessory muscles of respiration.  CARDIOVASCULAR: S1, S2 normal. No murmurs, rubs, or gallops.  ABDOMEN: Soft, nontender, nondistended. Bowel sounds present. No organomegaly or mass.  EXTREMITIES: No cyanosis, clubbing or edema b/l.   Right hip dressing in place with no drainage or bruising noted. NEUROLOGIC: Cranial nerves II through XII are intact. No focal Motor or sensory deficits b/l.   PSYCHIATRIC: The patient is alert and oriented x 3.  SKIN: No obvious rash, lesion, or ulcer.    LABORATORY PANEL:   CBC  Recent Labs Lab 04/25/16 0534  WBC 8.1  HGB 8.3*  HCT 24.2*  PLT 171   ------------------------------------------------------------------------------------------------------------------  Chemistries   Recent Labs Lab 04/25/16 0534  NA 131*  K 3.5  CL 98*  CO2 28  GLUCOSE 85  BUN 13  CREATININE 0.82  CALCIUM 7.9*   ------------------------------------------------------------------------------------------------------------------  Cardiac Enzymes No results for input(s): TROPONINI in the last 168 hours. ------------------------------------------------------------------------------------------------------------------  RADIOLOGY:  No results found.   ASSESSMENT AND PLAN:   75 year old female with past medical history of depression, anxiety, GERD, arthritis, neuropathy who presented to the hospital due to right hip pain from DJD and electively underwent a right hip replacement. Patient is postoperative #3 today. Hospitalist services were contacted for mental status changes.  1. Altered mental status-etiology unclear but likely psychiatric in nature. Much improved and back to baseline now. -Unlikely hyponatremia is contributing to her mental status change. No evidence  of acute infectious or other metabolic source. -Patient is stable to be discharged to a skilled nursing  facility from the medical standpoint.  2. History of depression-continue Wellbutrin, Cymbalta.  3. Anxiety-continue Valium.  4. GERD-continue Protonix.  5. Neuropathy-continue gabapentin.  6. Osteoarthritis status post right hip replacement-continue further care as per orthopedics. Tolerating physical therapy well. Pain control is good. Patient is to be discharged to a skilled nursing facility today.  Thanks for the consultation and patient is stable to be discharged from the medical standpoint.     All the records are reviewed and case discussed with Care Management/Social Workerr. Management plans discussed with the patient, family and they are in agreement.  CODE STATUS: Full  DVT Prophylaxis: Lovenox  TOTAL TIME TAKING CARE OF THIS PATIENT: 30 minutes.   POSSIBLE D/C IN 1-2 DAYS, DEPENDING ON CLINICAL CONDITION.   Henreitta Leber M.D on 04/25/2016 at 8:48 AM  Between 7am to 6pm - Pager - [phone]  After 6pm go to [email or web address] - password [email or web address] [facility]  Office  [phone]  CC: Primary care physician; Kirk Ruths., MD

## 2016-04-26 LAB — SURGICAL PATHOLOGY

## 2016-04-27 DIAGNOSIS — K219 Gastro-esophageal reflux disease without esophagitis: Secondary | ICD-10-CM | POA: Diagnosis not present

## 2016-04-27 DIAGNOSIS — F3340 Major depressive disorder, recurrent, in remission, unspecified: Secondary | ICD-10-CM | POA: Diagnosis not present

## 2016-04-27 DIAGNOSIS — M1610 Unilateral primary osteoarthritis, unspecified hip: Secondary | ICD-10-CM | POA: Diagnosis not present

## 2016-04-27 DIAGNOSIS — M549 Dorsalgia, unspecified: Secondary | ICD-10-CM

## 2016-05-06 DIAGNOSIS — F325 Major depressive disorder, single episode, in full remission: Secondary | ICD-10-CM | POA: Diagnosis not present

## 2016-05-06 DIAGNOSIS — K219 Gastro-esophageal reflux disease without esophagitis: Secondary | ICD-10-CM | POA: Diagnosis not present

## 2016-05-06 DIAGNOSIS — M1611 Unilateral primary osteoarthritis, right hip: Secondary | ICD-10-CM | POA: Diagnosis not present

## 2016-05-13 DIAGNOSIS — M159 Polyosteoarthritis, unspecified: Secondary | ICD-10-CM | POA: Insufficient documentation

## 2016-08-19 ENCOUNTER — Other Ambulatory Visit: Payer: Self-pay | Admitting: Neurosurgery

## 2016-08-19 DIAGNOSIS — G8929 Other chronic pain: Secondary | ICD-10-CM

## 2016-08-19 DIAGNOSIS — M545 Low back pain, unspecified: Secondary | ICD-10-CM

## 2016-09-06 ENCOUNTER — Ambulatory Visit
Admission: RE | Admit: 2016-09-06 | Discharge: 2016-09-06 | Disposition: A | Discharge status: Home / Self Care | Payer: Medicare Other | Source: Ambulatory Visit | Attending: Neurosurgery | Admitting: Neurosurgery

## 2016-09-06 DIAGNOSIS — M545 Low back pain, unspecified: Secondary | ICD-10-CM

## 2016-09-06 DIAGNOSIS — M79605 Pain in left leg: Secondary | ICD-10-CM | POA: Diagnosis not present

## 2016-09-06 DIAGNOSIS — Z9889 Other specified postprocedural states: Secondary | ICD-10-CM | POA: Diagnosis not present

## 2016-09-06 DIAGNOSIS — M5126 Other intervertebral disc displacement, lumbar region: Secondary | ICD-10-CM | POA: Insufficient documentation

## 2016-09-06 DIAGNOSIS — G8929 Other chronic pain: Secondary | ICD-10-CM

## 2016-09-06 LAB — POCT I-STAT CREATININE: Creatinine, Ser: 1 mg/dL (ref 0.44–1.00)

## 2016-09-29 ENCOUNTER — Ambulatory Visit (INDEPENDENT_AMBULATORY_CARE_PROVIDER_SITE_OTHER): Payer: Medicare Other

## 2016-09-29 ENCOUNTER — Encounter (INDEPENDENT_AMBULATORY_CARE_PROVIDER_SITE_OTHER): Payer: Self-pay | Admitting: Orthopedic Surgery

## 2016-09-29 ENCOUNTER — Ambulatory Visit (INDEPENDENT_AMBULATORY_CARE_PROVIDER_SITE_OTHER): Payer: Medicare Other | Admitting: Orthopedic Surgery

## 2016-09-29 VITALS — Ht 64.0 in | Wt 180.0 lb

## 2016-09-29 DIAGNOSIS — M25552 Pain in left hip: Secondary | ICD-10-CM | POA: Diagnosis not present

## 2016-09-29 DIAGNOSIS — M5416 Radiculopathy, lumbar region: Secondary | ICD-10-CM

## 2016-09-29 NOTE — Progress Notes (Signed)
Office Visit Note   Patient: Melody Ochoa           Date of Birth: 1941-07-19           MRN: [id number] Visit Date: 09/29/2016 Requested by: Kirk Ruths, MD [address], [place] [address] PCP: Kirk Ruths., MD  Subjective: Chief Complaint  Patient presents with  . Left Hip - Pain  Melody Ochoa is a 75 year old patient with left groin pain.  Been going on for several months.  It's grabbing type pain localized to the anterior superior iliac crest as well as the groin.  She has had 2 back surgeries last one was 2-1/2 years ago.  Had an MRI scan at 2 weeks ago which is reviewed today and does show a left-sided L2-3 and L3 IV nerve root impingement.  That study is reviewed with the patient along with the report which is provided to her.  Itching lesion was not having much right hip pain where she had severe arthritis prior to her hip replacement several years ago done in [place].  She's done well from that right hip surgery.  She denies any fevers and chills.    Patient comes in today c/o left hip/groin/pelvic crest pain.  Ongoing x 1 1/2 years approxiamately.  She has had a Right THA May 2017, Dr Roland Rack at [facility].   Says the right hip is doing great.  She feels she needs to use her cane to walk, she stumbles.  Dr Sharlet Salina (he sees her for her back) tells her he thinks it is her back, so she saw Dr Arnoldo Morale for her back, had lumbar surgery, 2013 per epic history, no op note in system, pt states 2 yr ago.  She says she cannot even walk a block without having to stop.  She is here for second opinion/treatment, does not want to see Dr Roland Rack again.                  Review of Systems all systems reviewed negative as they relate to the chief complaint of left hip pain.  Particularly no fevers and chills   Assessment & Plan: Visit Diagnoses:  1. Left hip pain     Plan: Impression is back mediated left hip pain.  Plain radiographs are  normal and exam is normal on the left hip.  She does have MRI evidence of nerve root compression at 2 levels on the left at L2-3 and L3-4.  I would favor referral to Dr. Ernestina Patches for repeat evaluation of the efficacy of injections in her back to help with her symptoms as well as for diagnostic purposes.  I don't think further workup on the hip is indicated at this time based on exam and plain radiographs.  She may come to further surgery.  On her back  Follow-Up Instructions: No Follow-up on file.   Orders:  No orders of the defined types were placed in this encounter.  No orders of the defined types were placed in this encounter.     Procedures: No procedures performed   Clinical Data: No additional findings.  Objective: Vital Signs: Ht 5\' 4"  (1.626 m)   Wt 180 lb (81.6 kg)   BMI 30.90 kg/m   Physical Exam  Constitutional: She appears well-developed.  HENT:  Head: Normocephalic.  Eyes: EOM are normal.  Neck: Normal range of motion.  Cardiovascular: Normal rate.   Pulmonary/Chest: Effort normal.  Neurological: She is alert.  Skin: Skin is warm.  Psychiatric: She has a normal mood and affect.    Ortho Exam patient has normal gait no groin pain with internal/external rotation of either leg while sitting.  No trochanteric tenderness is noted.  She has excellent hip flexion strength as well as ankle dorsi and plantar flexion strength pedal pulses palpable reflexes symmetric bilateral patella and Achilles no definite paresthesias L1 S1 bilaterally  Specialty Comments:  No specialty comments available.  Imaging: No results found.   PMFS History: Patient Active Problem List   Diagnosis Date Noted  . Status post total replacement of right hip 04/22/2016  . Personal history of colonic polyps   . Back pain 11/22/2014  . UTI (lower urinary tract infection) 11/22/2014  . Sepsis ([place]) 11/22/2014  . Low back pain 11/22/2014  . Fever 11/22/2014  . Normocytic anemia 11/22/2014    . Spondylolisthesis of lumbar region 11/18/2014   Past Medical History:  Diagnosis Date  . Arthritis   . Cancer ([place])    skin  . Depression   . Diverticulosis   . Dizziness    patient had episode of dizziness when came in room. hx past no dx  . GERD (gastroesophageal reflux disease)   . Heart murmur   . PONV (postoperative nausea and vomiting)    yrs ago    No family history on file.  Past Surgical History:  Procedure Laterality Date  . BACK SURGERY  [place] SURGERY  09  . COLONOSCOPY WITH PROPOFOL N/A 01/13/2016   Procedure: COLONOSCOPY WITH PROPOFOL;  Surgeon: [name], MD;  Location: [facility] ENDOSCOPY;  Service: Endoscopy;  Laterality: N/A;  . FACELIFT  94  . TONSILLECTOMY    . TOTAL HIP ARTHROPLASTY Right 04/22/2016   Procedure: TOTAL HIP ARTHROPLASTY;  Surgeon: Corky Mull, MD;  Location: [facility] ORS;  Service: Orthopedics;  Laterality: Right;   Social History   Occupational History  . Not on file.   Social History Main Topics  . Smoking status: Former Smoker    Packs/day: 1.00    Years: 40.00    Types: Cigarettes    Quit date: 11/13/1996  . Smokeless tobacco: Not on file  . Alcohol use Yes     Comment: occ wine  . Drug use: No  . Sexual activity: Not on file

## 2016-10-06 ENCOUNTER — Telehealth (INDEPENDENT_AMBULATORY_CARE_PROVIDER_SITE_OTHER): Payer: Self-pay

## 2016-10-06 NOTE — Telephone Encounter (Signed)
Please call pt regarding appt with Dr Ernestina Patches

## 2016-10-06 NOTE — Telephone Encounter (Signed)
Message was left with husband for pt to call us back for scheduling

## 2016-11-16 DIAGNOSIS — C4492 Squamous cell carcinoma of skin, unspecified: Secondary | ICD-10-CM

## 2016-11-16 HISTORY — DX: Squamous cell carcinoma of skin, unspecified: C44.92

## 2016-11-19 ENCOUNTER — Other Ambulatory Visit: Payer: Self-pay | Admitting: Neurosurgery

## 2016-11-19 DIAGNOSIS — M5440 Lumbago with sciatica, unspecified side: Secondary | ICD-10-CM

## 2016-11-25 ENCOUNTER — Ambulatory Visit: Payer: Medicare Other | Attending: Neurosurgery

## 2016-12-03 DIAGNOSIS — Z96641 Presence of right artificial hip joint: Secondary | ICD-10-CM | POA: Diagnosis present

## 2016-12-03 DIAGNOSIS — M7138 Other bursal cyst, other site: Secondary | ICD-10-CM | POA: Diagnosis present

## 2016-12-03 DIAGNOSIS — M48062 Spinal stenosis, lumbar region with neurogenic claudication: Secondary | ICD-10-CM | POA: Diagnosis present

## 2016-12-03 DIAGNOSIS — Z86010 Personal history of colonic polyps: Secondary | ICD-10-CM | POA: Diagnosis not present

## 2016-12-21 DIAGNOSIS — E538 Deficiency of other specified B group vitamins: Secondary | ICD-10-CM | POA: Diagnosis not present

## 2016-12-21 DIAGNOSIS — R7303 Prediabetes: Secondary | ICD-10-CM | POA: Diagnosis not present

## 2016-12-21 DIAGNOSIS — F325 Major depressive disorder, single episode, in full remission: Secondary | ICD-10-CM | POA: Diagnosis not present

## 2016-12-21 DIAGNOSIS — M159 Polyosteoarthritis, unspecified: Secondary | ICD-10-CM | POA: Diagnosis not present

## 2016-12-21 DIAGNOSIS — I700 Atherosclerosis of aorta: Secondary | ICD-10-CM | POA: Diagnosis not present

## 2016-12-21 DIAGNOSIS — Z0000 Encounter for general adult medical examination without abnormal findings: Secondary | ICD-10-CM | POA: Diagnosis not present

## 2017-01-04 DIAGNOSIS — L720 Epidermal cyst: Secondary | ICD-10-CM | POA: Diagnosis not present

## 2017-01-11 DIAGNOSIS — L720 Epidermal cyst: Secondary | ICD-10-CM | POA: Diagnosis not present

## 2017-01-18 DIAGNOSIS — F325 Major depressive disorder, single episode, in full remission: Secondary | ICD-10-CM | POA: Diagnosis not present

## 2017-01-21 DIAGNOSIS — E538 Deficiency of other specified B group vitamins: Secondary | ICD-10-CM | POA: Diagnosis not present

## 2017-01-27 DIAGNOSIS — Z981 Arthrodesis status: Secondary | ICD-10-CM | POA: Diagnosis not present

## 2017-01-27 DIAGNOSIS — M48062 Spinal stenosis, lumbar region with neurogenic claudication: Secondary | ICD-10-CM | POA: Diagnosis not present

## 2017-02-24 DIAGNOSIS — M48062 Spinal stenosis, lumbar region with neurogenic claudication: Secondary | ICD-10-CM | POA: Diagnosis not present

## 2017-02-24 DIAGNOSIS — Z981 Arthrodesis status: Secondary | ICD-10-CM | POA: Diagnosis not present

## 2017-02-28 DIAGNOSIS — S76012A Strain of muscle, fascia and tendon of left hip, initial encounter: Secondary | ICD-10-CM | POA: Insufficient documentation

## 2017-02-28 DIAGNOSIS — M1612 Unilateral primary osteoarthritis, left hip: Secondary | ICD-10-CM | POA: Diagnosis not present

## 2017-02-28 DIAGNOSIS — M25552 Pain in left hip: Secondary | ICD-10-CM | POA: Diagnosis not present

## 2017-03-08 DIAGNOSIS — R2689 Other abnormalities of gait and mobility: Secondary | ICD-10-CM | POA: Diagnosis not present

## 2017-03-08 DIAGNOSIS — M6281 Muscle weakness (generalized): Secondary | ICD-10-CM | POA: Diagnosis not present

## 2017-03-08 DIAGNOSIS — Z9181 History of falling: Secondary | ICD-10-CM | POA: Diagnosis not present

## 2017-03-08 DIAGNOSIS — M25552 Pain in left hip: Secondary | ICD-10-CM | POA: Diagnosis not present

## 2017-03-08 DIAGNOSIS — M5442 Lumbago with sciatica, left side: Secondary | ICD-10-CM | POA: Diagnosis not present

## 2017-03-08 DIAGNOSIS — M159 Polyosteoarthritis, unspecified: Secondary | ICD-10-CM | POA: Diagnosis not present

## 2017-03-14 DIAGNOSIS — Z9181 History of falling: Secondary | ICD-10-CM | POA: Diagnosis not present

## 2017-03-14 DIAGNOSIS — M6281 Muscle weakness (generalized): Secondary | ICD-10-CM | POA: Diagnosis not present

## 2017-03-14 DIAGNOSIS — M5442 Lumbago with sciatica, left side: Secondary | ICD-10-CM | POA: Diagnosis not present

## 2017-03-14 DIAGNOSIS — M159 Polyosteoarthritis, unspecified: Secondary | ICD-10-CM | POA: Diagnosis not present

## 2017-03-14 DIAGNOSIS — R2689 Other abnormalities of gait and mobility: Secondary | ICD-10-CM | POA: Diagnosis not present

## 2017-03-14 DIAGNOSIS — M25552 Pain in left hip: Secondary | ICD-10-CM | POA: Diagnosis not present

## 2017-03-22 DIAGNOSIS — M25552 Pain in left hip: Secondary | ICD-10-CM | POA: Diagnosis not present

## 2017-03-22 DIAGNOSIS — M159 Polyosteoarthritis, unspecified: Secondary | ICD-10-CM | POA: Diagnosis not present

## 2017-03-22 DIAGNOSIS — M6281 Muscle weakness (generalized): Secondary | ICD-10-CM | POA: Diagnosis not present

## 2017-03-22 DIAGNOSIS — M5442 Lumbago with sciatica, left side: Secondary | ICD-10-CM | POA: Diagnosis not present

## 2017-03-22 DIAGNOSIS — Z9181 History of falling: Secondary | ICD-10-CM | POA: Diagnosis not present

## 2017-03-22 DIAGNOSIS — R2689 Other abnormalities of gait and mobility: Secondary | ICD-10-CM | POA: Diagnosis not present

## 2017-03-24 DIAGNOSIS — M25552 Pain in left hip: Secondary | ICD-10-CM | POA: Diagnosis not present

## 2017-03-24 DIAGNOSIS — M159 Polyosteoarthritis, unspecified: Secondary | ICD-10-CM | POA: Diagnosis not present

## 2017-03-24 DIAGNOSIS — M5442 Lumbago with sciatica, left side: Secondary | ICD-10-CM | POA: Diagnosis not present

## 2017-03-24 DIAGNOSIS — M6281 Muscle weakness (generalized): Secondary | ICD-10-CM | POA: Diagnosis not present

## 2017-03-24 DIAGNOSIS — Z9181 History of falling: Secondary | ICD-10-CM | POA: Diagnosis not present

## 2017-03-24 DIAGNOSIS — R2689 Other abnormalities of gait and mobility: Secondary | ICD-10-CM | POA: Diagnosis not present

## 2017-04-06 DIAGNOSIS — M1612 Unilateral primary osteoarthritis, left hip: Secondary | ICD-10-CM | POA: Diagnosis not present

## 2017-04-07 DIAGNOSIS — M159 Polyosteoarthritis, unspecified: Secondary | ICD-10-CM | POA: Diagnosis not present

## 2017-04-07 DIAGNOSIS — M5442 Lumbago with sciatica, left side: Secondary | ICD-10-CM | POA: Diagnosis not present

## 2017-04-07 DIAGNOSIS — M25552 Pain in left hip: Secondary | ICD-10-CM | POA: Diagnosis not present

## 2017-04-07 DIAGNOSIS — Z9181 History of falling: Secondary | ICD-10-CM | POA: Diagnosis not present

## 2017-04-07 DIAGNOSIS — M6281 Muscle weakness (generalized): Secondary | ICD-10-CM | POA: Diagnosis not present

## 2017-04-07 DIAGNOSIS — R2689 Other abnormalities of gait and mobility: Secondary | ICD-10-CM | POA: Diagnosis not present

## 2017-04-08 DIAGNOSIS — Z9181 History of falling: Secondary | ICD-10-CM | POA: Diagnosis not present

## 2017-04-08 DIAGNOSIS — M25552 Pain in left hip: Secondary | ICD-10-CM | POA: Diagnosis not present

## 2017-04-08 DIAGNOSIS — R2689 Other abnormalities of gait and mobility: Secondary | ICD-10-CM | POA: Diagnosis not present

## 2017-04-08 DIAGNOSIS — M6281 Muscle weakness (generalized): Secondary | ICD-10-CM | POA: Diagnosis not present

## 2017-04-08 DIAGNOSIS — M5442 Lumbago with sciatica, left side: Secondary | ICD-10-CM | POA: Diagnosis not present

## 2017-04-08 DIAGNOSIS — M159 Polyosteoarthritis, unspecified: Secondary | ICD-10-CM | POA: Diagnosis not present

## 2017-04-12 DIAGNOSIS — I700 Atherosclerosis of aorta: Secondary | ICD-10-CM | POA: Diagnosis not present

## 2017-04-12 DIAGNOSIS — R7303 Prediabetes: Secondary | ICD-10-CM | POA: Diagnosis not present

## 2017-04-12 DIAGNOSIS — E538 Deficiency of other specified B group vitamins: Secondary | ICD-10-CM | POA: Diagnosis not present

## 2017-04-19 DIAGNOSIS — R7303 Prediabetes: Secondary | ICD-10-CM | POA: Diagnosis not present

## 2017-04-19 DIAGNOSIS — R799 Abnormal finding of blood chemistry, unspecified: Secondary | ICD-10-CM | POA: Diagnosis not present

## 2017-04-19 DIAGNOSIS — E538 Deficiency of other specified B group vitamins: Secondary | ICD-10-CM | POA: Diagnosis not present

## 2017-04-19 DIAGNOSIS — L98499 Non-pressure chronic ulcer of skin of other sites with unspecified severity: Secondary | ICD-10-CM | POA: Diagnosis not present

## 2017-04-19 DIAGNOSIS — M159 Polyosteoarthritis, unspecified: Secondary | ICD-10-CM | POA: Diagnosis not present

## 2017-04-19 DIAGNOSIS — F325 Major depressive disorder, single episode, in full remission: Secondary | ICD-10-CM | POA: Diagnosis not present

## 2017-05-03 ENCOUNTER — Other Ambulatory Visit (HOSPITAL_COMMUNITY): Payer: Self-pay | Admitting: Psychiatry

## 2017-05-09 DIAGNOSIS — S76012D Strain of muscle, fascia and tendon of left hip, subsequent encounter: Secondary | ICD-10-CM | POA: Diagnosis not present

## 2017-05-09 DIAGNOSIS — M1612 Unilateral primary osteoarthritis, left hip: Secondary | ICD-10-CM | POA: Diagnosis not present

## 2017-05-24 ENCOUNTER — Other Ambulatory Visit (HOSPITAL_COMMUNITY): Payer: Self-pay | Admitting: Psychiatry

## 2017-07-01 DIAGNOSIS — H04223 Epiphora due to insufficient drainage, bilateral lacrimal glands: Secondary | ICD-10-CM | POA: Diagnosis not present

## 2017-07-11 DIAGNOSIS — M1612 Unilateral primary osteoarthritis, left hip: Secondary | ICD-10-CM | POA: Diagnosis not present

## 2017-07-11 DIAGNOSIS — M5136 Other intervertebral disc degeneration, lumbar region: Secondary | ICD-10-CM | POA: Diagnosis not present

## 2017-07-11 DIAGNOSIS — M7061 Trochanteric bursitis, right hip: Secondary | ICD-10-CM | POA: Diagnosis not present

## 2017-07-11 DIAGNOSIS — M7062 Trochanteric bursitis, left hip: Secondary | ICD-10-CM | POA: Diagnosis not present

## 2017-07-20 DIAGNOSIS — F325 Major depressive disorder, single episode, in full remission: Secondary | ICD-10-CM | POA: Diagnosis not present

## 2017-07-24 ENCOUNTER — Emergency Department
Admission: EM | Admit: 2017-07-24 | Discharge: 2017-07-24 | Disposition: A | Discharge status: Home / Self Care | Payer: Medicare Other | Attending: Emergency Medicine | Admitting: Emergency Medicine

## 2017-07-24 DIAGNOSIS — Y939 Activity, unspecified: Secondary | ICD-10-CM | POA: Insufficient documentation

## 2017-07-24 DIAGNOSIS — S01511A Laceration without foreign body of lip, initial encounter: Secondary | ICD-10-CM | POA: Diagnosis not present

## 2017-07-24 DIAGNOSIS — Y929 Unspecified place or not applicable: Secondary | ICD-10-CM | POA: Diagnosis not present

## 2017-07-24 DIAGNOSIS — Y999 Unspecified external cause status: Secondary | ICD-10-CM | POA: Diagnosis not present

## 2017-07-24 DIAGNOSIS — W2209XA Striking against other stationary object, initial encounter: Secondary | ICD-10-CM | POA: Diagnosis not present

## 2017-07-24 DIAGNOSIS — Z87891 Personal history of nicotine dependence: Secondary | ICD-10-CM | POA: Insufficient documentation

## 2017-07-24 DIAGNOSIS — S00501A Unspecified superficial injury of lip, initial encounter: Secondary | ICD-10-CM | POA: Diagnosis present

## 2017-07-24 DIAGNOSIS — Z23 Encounter for immunization: Secondary | ICD-10-CM | POA: Diagnosis not present

## 2017-07-24 DIAGNOSIS — Z79899 Other long term (current) drug therapy: Secondary | ICD-10-CM | POA: Diagnosis not present

## 2017-07-24 MED ORDER — TETANUS-DIPHTH-ACELL PERTUSSIS 5-2.5-18.5 LF-MCG/0.5 IM SUSP
0.5000 mL | Freq: Once | INTRAMUSCULAR | Status: AC
  Administered 2017-07-24: 0.5 mL via INTRAMUSCULAR
  Filled 2017-07-24: qty 0.5

## 2017-07-24 MED ORDER — LIDOCAINE HCL (PF) 1 % IJ SOLN
INTRAMUSCULAR | Status: AC
  Administered 2017-07-24: 5 mL
  Filled 2017-07-24: qty 5

## 2017-07-24 NOTE — ED Provider Notes (Signed)
[facility] Emergency Department Provider Note  ____________________________________________  Time seen: Approximately 7:03 AM  I have reviewed the triage vital signs and the nursing notes.   HISTORY  Chief Complaint Laceration   HPI ARDICE BOYAN is a 76 y.o. female who presents for evaluation of a lip laceration. Patient reports that she roll out of bed and hit her mouth on her nightstand sustaining a lip laceration. denies head trauma or LOC. She is not on any blood thinners.She is complaining of mild constant pain located in her lip region that has been present since the fall. She denies neck pain, back pain, extremity pain, chest pain, abdominal pain. Unknown last tetanus shot.  Past Medical History:  Diagnosis Date  . Arthritis   . Cancer ([place])    skin  . Depression   . Diverticulosis   . Dizziness    patient had episode of dizziness when came in room. hx past no dx  . GERD (gastroesophageal reflux disease)   . Heart murmur   . PONV (postoperative nausea and vomiting)    yrs ago    Patient Active Problem List   Diagnosis Date Noted  . Status post total replacement of right hip 04/22/2016  . Personal history of colonic polyps   . Back pain 11/22/2014  . UTI (lower urinary tract infection) 11/22/2014  . Sepsis ([place]) 11/22/2014  . Low back pain 11/22/2014  . Fever 11/22/2014  . Normocytic anemia 11/22/2014  . Spondylolisthesis of lumbar region 11/18/2014    Past Surgical History:  Procedure Laterality Date  . BACK SURGERY  [place] SURGERY  09  . COLONOSCOPY WITH PROPOFOL N/A 01/13/2016   Procedure: COLONOSCOPY WITH PROPOFOL;  Surgeon: [name], MD;  Location: [facility] ENDOSCOPY;  Service: Endoscopy;  Laterality: N/A;  . FACELIFT  94  . TONSILLECTOMY    . TOTAL HIP ARTHROPLASTY Right 04/22/2016   Procedure: TOTAL HIP ARTHROPLASTY;  Surgeon: Corky Mull, MD;  Location: [facility] ORS;  Service: Orthopedics;  Laterality: Right;      Prior to Admission medications   Medication Sig Start Date End Date Taking? Authorizing Provider  buPROPion (WELLBUTRIN SR) 200 MG 12 hr tablet Take 200 mg by mouth 2 (two) times daily. 10/06/14   [provider]  Calcium Carbonate-Vitamin D (CALTRATE 600+D PO) Take 1 tablet by mouth daily.    [provider]  Cholecalciferol (VITAMIN D PO) Take 1 tablet by mouth daily.    [provider]  diazepam (VALIUM) 2 MG tablet Take 2 mg by mouth 3 (three) times daily as needed for anxiety or muscle spasms.    [provider]  DULoxetine (CYMBALTA) 60 MG capsule Take 120 mg by mouth daily.     [provider]  enoxaparin (LOVENOX) 40 MG/0.4ML injection Inject 0.4 mLs (40 mg total) into the skin daily. Patient not taking: Reported on 09/29/2016 04/25/16   Lattie Corns, PA-C  ferrous sulfate 325 (65 FE) MG tablet Take 1 tablet (325 mg total) by mouth 3 (three) times daily with meals. Patient taking differently: Take 325 mg by mouth daily.  11/25/14   Caren Griffins, MD  gabapentin (NEURONTIN) 300 MG capsule Take 600 mg by mouth at bedtime.  09/01/14   [provider]  HYDROcodone-acetaminophen (NORCO) 10-325 MG tablet Take 0.5-1 tablets by mouth every 8 (eight) hours as needed.    [provider]  meclizine (ANTIVERT) 25 MG tablet Take 1 tablet (25 mg total)  by mouth 3 (three) times daily as needed for dizziness. Patient not taking: Reported on 09/29/2016 01/24/16   Nance Pear, MD  pantoprazole (PROTONIX) 40 MG tablet Take 40 mg by mouth daily. 10/16/14   [provider]  traMADol (ULTRAM) 50 MG tablet Take 1-2 tablets (50-100 mg total) by mouth every 6 (six) hours. Patient not taking: Reported on 09/29/2016 04/25/16   Lattie Corns, PA-C    Allergies Aleve [naproxen sodium]; Aspirin; Celebrex [celecoxib]; Effexor [venlafaxine]; Ibuprofen; Vioxx [rofecoxib]; Other; and Tramadol  No family history on  file.  Social History Social History  Substance Use Topics  . Smoking status: Former Smoker    Packs/day: 1.00    Years: 40.00    Types: Cigarettes    Quit date: 11/13/1996  . Smokeless tobacco: Not on file  . Alcohol use Yes     Comment: occ wine    Review of Systems  Constitutional: Negative for fever. Eyes: Negative for visual changes. ENT: Negative for sore throat. Neck: No neck pain  Cardiovascular: Negative for chest pain. Respiratory: Negative for shortness of breath. Gastrointestinal: Negative for abdominal pain, vomiting or diarrhea. Genitourinary: Negative for dysuria. Musculoskeletal: Negative for back pain. Skin: Negative for rash. + lip laceration Neurological: Negative for headaches, weakness or numbness. Psych: No SI or HI  ____________________________________________   PHYSICAL EXAM:  VITAL SIGNS: ED Triage Vitals  Enc Vitals Group     BP 07/24/17 0456 (!) 174/95     Pulse Rate 07/24/17 0456 87     Resp 07/24/17 0456 20     Temp --      Temp src --      SpO2 07/24/17 0456 98 %     Weight 07/24/17 0441 185 lb (83.9 kg)     Height 07/24/17 0441 5\' 4"  (1.626 m)     Head Circumference --      Peak Flow --      Pain Score 07/24/17 0440 6     Pain Loc --      Pain Edu? --      Excl. in [place]? --     Constitutional: Alert and oriented. No acute distress. Does not appear intoxicated. HEENT Head: Normocephalic and atraumatic. Face: No facial bony tenderness. Stable midface Ears: No hemotympanum bilaterally. No Battle sign Eyes: No eye injury. PERRL. No raccoon eyes Nose: Nontender. No epistaxis. No rhinorrhea Mouth/Throat: Laceration located in the midline of the upper lip involving the vermilion border. Mucous membranes are moist. No oropharyngeal blood. No dental injury. Airway patent without stridor. Normal voice. Neck: C-collar in place. No midline c-spine tenderness.  Cardiovascular: Normal rate, regular rhythm. Normal and symmetric distal pulses  are present in all extremities. Pulmonary/Chest: Chest wall is stable and nontender to palpation/compression. Normal respiratory effort. Breath sounds are normal. No crepitus.  Abdominal: Soft, nontender, non distended. Musculoskeletal: Nontender with normal full range of motion in all extremities. No deformities. No thoracic or lumbar midline spinal tenderness. Pelvis is stable. Skin: Skin is warm, dry and intact. No abrasions or contutions. Psychiatric: Speech and behavior are appropriate. Neurological: Normal speech and language. Moves all extremities to command. No gross focal neurologic deficits are appreciated.  Glascow Coma Score: 4 - Opens eyes on own 6 - Follows simple motor commands 5 - Alert and oriented GCS: 15   ____________________________________________   LABS (all labs ordered are listed, but only abnormal results are displayed)  Labs Reviewed - No data to display ____________________________________________  EKG  none  ____________________________________________  RADIOLOGY  none  ____________________________________________   PROCEDURES  Procedure(s) performed: yes .Marland KitchenLaceration Repair Date/Time: 07/24/2017 7:06 AM Performed by: Rudene Re Authorized by: Rudene Re   Consent:    Consent obtained:  Verbal   Consent given by:  Patient   Risks discussed:  Infection, pain, poor cosmetic result and poor wound healing Anesthesia (see MAR for exact dosages):    Anesthesia method:  Nerve block   Block needle gauge:  25 G   Block anesthetic:  Lidocaine 1% w/o epi   Block injection procedure:  Anatomic landmarks identified   Block outcome:  Anesthesia achieved Laceration details:    Location:  Lip   Lip location:  Upper lip, full thickness   Vermilion border involved: yes     Height of lip laceration:  More than half vertical height   Length (cm):  1.5 Repair type:    Repair type:  Simple Pre-procedure details:    Preparation:   Patient was prepped and draped in usual sterile fashion Exploration:    Hemostasis achieved with:  Direct pressure   Wound exploration: wound explored through full range of motion     Wound extent: no foreign bodies/material noted     Contaminated: no   Treatment:    Area cleansed with:  Saline Skin repair:    Repair method:  Sutures   Suture size:  6-0   Suture material:  Fast-absorbing gut   Suture technique:  Simple interrupted   Number of sutures:  5 Approximation:    Approximation:  Close   Vermilion border: well-aligned   Post-procedure details:    Dressing:  Open (no dressing)   Patient tolerance of procedure:  Tolerated well, no immediate complications   Critical Care performed:  None ____________________________________________   INITIAL IMPRESSION / ASSESSMENT AND PLAN / ED COURSE  76 y.o. female who presents for evaluation of a lip laceration s/p falling off the bed. Tetanus renewed. Laceration repaired per note above. No indication for imaging of head or neck with no trauma to head or neck. Wound care discussed with patient     Pertinent labs & imaging results that were available during my care of the patient were reviewed by me and considered in my medical decision making (see chart for details).    ____________________________________________   FINAL CLINICAL IMPRESSION(S) / ED DIAGNOSES  Final diagnoses:  Lip laceration, initial encounter      NEW MEDICATIONS STARTED DURING THIS VISIT:  New Prescriptions   No medications on file     Note:  This document was prepared using Dragon voice recognition software and may include unintentional dictation errors.    Rudene Re, MD 07/24/17 [phone]

## 2017-07-24 NOTE — ED Triage Notes (Signed)
Patient reports when she rolled over in bed she fell out and hit cabinet.  Patient with laceration through and through upper lip and border.

## 2017-07-24 NOTE — Discharge Instructions (Signed)
Keep laceration dry and clean. Wash with warm water and soap. Apply topical bacitracin. Protect from the sun to minimize scarring. Cover it with SPF 56 or higher and use hat when out in the sun for 6-9 months. You received 5 stithces that will dissolve.  Watch for signs of infection: pus, redness of the skin surrounding it, or fever. If these develop see your doctor or return to the ER for antibiotics.

## 2017-07-26 ENCOUNTER — Telehealth (INDEPENDENT_AMBULATORY_CARE_PROVIDER_SITE_OTHER): Payer: Self-pay | Admitting: Orthopedic Surgery

## 2017-07-26 NOTE — Telephone Encounter (Signed)
I received VM from patient. I called her back and she wanted to know if she had seen Dr. Marlou Sa before. I confirmed she had and I offered to schedule appt. She stated she was in pain but declined for now as is getting ready to go on vacation. She will call back.

## 2017-08-01 DIAGNOSIS — I700 Atherosclerosis of aorta: Secondary | ICD-10-CM | POA: Diagnosis not present

## 2017-08-01 DIAGNOSIS — M8580 Other specified disorders of bone density and structure, unspecified site: Secondary | ICD-10-CM | POA: Diagnosis not present

## 2017-08-01 DIAGNOSIS — F325 Major depressive disorder, single episode, in full remission: Secondary | ICD-10-CM | POA: Diagnosis not present

## 2017-08-01 DIAGNOSIS — R7303 Prediabetes: Secondary | ICD-10-CM | POA: Diagnosis not present

## 2017-08-09 ENCOUNTER — Encounter: Payer: Self-pay | Admitting: Emergency Medicine

## 2017-08-09 ENCOUNTER — Emergency Department
Admission: EM | Admit: 2017-08-09 | Discharge: 2017-08-10 | Disposition: A | Discharge status: Other Acute Inpatient Hospital | Payer: Medicare Other | Attending: Emergency Medicine | Admitting: Emergency Medicine

## 2017-08-09 ENCOUNTER — Emergency Department: Payer: Medicare Other

## 2017-08-09 DIAGNOSIS — Z79899 Other long term (current) drug therapy: Secondary | ICD-10-CM | POA: Diagnosis not present

## 2017-08-09 DIAGNOSIS — Z85828 Personal history of other malignant neoplasm of skin: Secondary | ICD-10-CM | POA: Diagnosis not present

## 2017-08-09 DIAGNOSIS — R531 Weakness: Secondary | ICD-10-CM

## 2017-08-09 DIAGNOSIS — M6281 Muscle weakness (generalized): Secondary | ICD-10-CM | POA: Diagnosis not present

## 2017-08-09 DIAGNOSIS — I639 Cerebral infarction, unspecified: Secondary | ICD-10-CM

## 2017-08-09 DIAGNOSIS — R42 Dizziness and giddiness: Secondary | ICD-10-CM | POA: Diagnosis not present

## 2017-08-09 DIAGNOSIS — R29818 Other symptoms and signs involving the nervous system: Secondary | ICD-10-CM | POA: Diagnosis not present

## 2017-08-09 DIAGNOSIS — F17210 Nicotine dependence, cigarettes, uncomplicated: Secondary | ICD-10-CM | POA: Insufficient documentation

## 2017-08-09 DIAGNOSIS — R4781 Slurred speech: Secondary | ICD-10-CM | POA: Diagnosis not present

## 2017-08-09 HISTORY — DX: Dorsalgia, unspecified: M54.9

## 2017-08-09 LAB — TYPE AND SCREEN
ABO/RH(D): O POS
Antibody Screen: NEGATIVE

## 2017-08-09 LAB — TROPONIN I: Troponin I: 0.03 ng/mL (ref <0.03)

## 2017-08-09 LAB — COMPREHENSIVE METABOLIC PANEL
ALT: 17 U/L (ref 14–54)
AST: 21 U/L (ref 15–41)
Albumin: 4.3 g/dL (ref 3.5–5.0)
Alkaline Phosphatase: 59 U/L (ref 38–126)
Anion gap: 11 (ref 5–15)
BUN: 19 mg/dL (ref 6–20)
CO2: 25 mmol/L (ref 22–32)
Calcium: 9.5 mg/dL (ref 8.9–10.3)
Chloride: 101 mmol/L (ref 101–111)
Creatinine, Ser: 1.06 mg/dL — ABNORMAL HIGH (ref 0.44–1.00)
GFR calc Af Amer: 57 mL/min — ABNORMAL LOW (ref >60)
GFR calc non Af Amer: 49 mL/min — ABNORMAL LOW (ref >60)
Glucose, Bld: 150 mg/dL — ABNORMAL HIGH (ref 65–99)
Potassium: 3.9 mmol/L (ref 3.5–5.1)
Sodium: 137 mmol/L (ref 135–145)
Total Bilirubin: 0.6 mg/dL (ref 0.3–1.2)
Total Protein: 8 g/dL (ref 6.5–8.1)

## 2017-08-09 LAB — URINALYSIS, ROUTINE W REFLEX MICROSCOPIC
Bilirubin Urine: NEGATIVE
Glucose, UA: 150 mg/dL — AB
Hgb urine dipstick: NEGATIVE
Ketones, ur: NEGATIVE mg/dL
Leukocytes, UA: NEGATIVE
Nitrite: NEGATIVE
Protein, ur: NEGATIVE mg/dL
Specific Gravity, Urine: 1.02 (ref 1.005–1.030)
pH: 8 (ref 5.0–8.0)

## 2017-08-09 LAB — CBC
HCT: 35.3 % (ref 35.0–47.0)
Hemoglobin: 12.1 g/dL (ref 12.0–16.0)
MCH: 29.4 pg (ref 26.0–34.0)
MCHC: 34.3 g/dL (ref 32.0–36.0)
MCV: 85.7 fL (ref 80.0–100.0)
Platelets: 232 10*3/uL (ref 150–440)
RBC: 4.12 MIL/uL (ref 3.80–5.20)
RDW: 14.2 % (ref 11.5–14.5)
WBC: 7.4 10*3/uL (ref 3.6–11.0)

## 2017-08-09 LAB — DIFFERENTIAL
Basophils Absolute: 0.1 10*3/uL (ref 0–0.1)
Basophils Relative: 1 %
Eosinophils Absolute: 0.3 10*3/uL (ref 0–0.7)
Eosinophils Relative: 4 %
Lymphocytes Relative: 20 %
Lymphs Abs: 1.5 10*3/uL (ref 1.0–3.6)
Monocytes Absolute: 0.7 10*3/uL (ref 0.2–0.9)
Monocytes Relative: 9 %
Neutro Abs: 4.9 10*3/uL (ref 1.4–6.5)
Neutrophils Relative %: 66 %

## 2017-08-09 LAB — URINE DRUG SCREEN, QUALITATIVE (ARMC ONLY)
Amphetamines, Ur Screen: NOT DETECTED
Barbiturates, Ur Screen: NOT DETECTED
Benzodiazepine, Ur Scrn: NOT DETECTED
Cannabinoid 50 Ng, Ur ~~LOC~~: NOT DETECTED
Cocaine Metabolite,Ur ~~LOC~~: NOT DETECTED
MDMA (Ecstasy)Ur Screen: NOT DETECTED
Methadone Scn, Ur: NOT DETECTED
Opiate, Ur Screen: NOT DETECTED
Phencyclidine (PCP) Ur S: NOT DETECTED
Tricyclic, Ur Screen: NOT DETECTED

## 2017-08-09 LAB — PROTIME-INR
INR: 0.91
Prothrombin Time: 12.2 seconds (ref 11.4–15.2)

## 2017-08-09 LAB — ETHANOL: Alcohol, Ethyl (B): <5 mg/dL (ref <5)

## 2017-08-09 LAB — GLUCOSE, CAPILLARY: Glucose-Capillary: 130 mg/dL — ABNORMAL HIGH (ref 65–99)

## 2017-08-09 LAB — APTT: aPTT: 27 seconds (ref 24–36)

## 2017-08-09 MED ORDER — SODIUM CHLORIDE 0.9 % IV SOLN
50.0000 mL | Freq: Once | INTRAVENOUS | Status: AC
  Administered 2017-08-09: 50 mL via INTRAVENOUS

## 2017-08-09 MED ORDER — ALTEPLASE 100 MG IV SOLR
INTRAVENOUS | Status: AC
  Administered 2017-08-09: 76 mg via INTRAVENOUS
  Filled 2017-08-09: qty 100

## 2017-08-09 MED ORDER — NICARDIPINE HCL IN NACL 20-0.86 MG/200ML-% IV SOLN
0.0000 mg/h | INTRAVENOUS | Status: DC
  Administered 2017-08-09: 5 mg/h via INTRAVENOUS
  Filled 2017-08-09: qty 200

## 2017-08-09 MED ORDER — METOCLOPRAMIDE HCL 5 MG/ML IJ SOLN
INTRAMUSCULAR | Status: AC
  Filled 2017-08-09: qty 2

## 2017-08-09 MED ORDER — LABETALOL HCL 5 MG/ML IV SOLN
10.0000 mg | Freq: Once | INTRAVENOUS | Status: AC
  Administered 2017-08-09: 10 mg via INTRAVENOUS

## 2017-08-09 MED ORDER — ONDANSETRON HCL 4 MG/2ML IJ SOLN
INTRAMUSCULAR | Status: AC
  Administered 2017-08-09: 4 mg via INTRAVENOUS
  Filled 2017-08-09: qty 2

## 2017-08-09 MED ORDER — ONDANSETRON HCL 4 MG/2ML IJ SOLN
4.0000 mg | Freq: Once | INTRAMUSCULAR | Status: AC
  Administered 2017-08-09: 4 mg via INTRAVENOUS

## 2017-08-09 MED ORDER — IOPAMIDOL (ISOVUE-370) INJECTION 76%
75.0000 mL | Freq: Once | INTRAVENOUS | Status: AC | PRN
  Administered 2017-08-09: 75 mL via INTRAVENOUS

## 2017-08-09 MED ORDER — ALTEPLASE (STROKE) FULL DOSE INFUSION
0.9000 mg/kg | Freq: Once | INTRAVENOUS | Status: AC
  Administered 2017-08-09: 76 mg via INTRAVENOUS

## 2017-08-09 MED ORDER — LABETALOL HCL 5 MG/ML IV SOLN
10.0000 mg | Freq: Once | INTRAVENOUS | Status: AC
  Administered 2017-08-09: 10 mg via INTRAVENOUS
  Filled 2017-08-09: qty 4

## 2017-08-09 MED ORDER — METOCLOPRAMIDE HCL 5 MG/ML IJ SOLN
10.0000 mg | Freq: Once | INTRAMUSCULAR | Status: AC
  Administered 2017-08-09: 10 mg via INTRAVENOUS

## 2017-08-09 NOTE — ED Notes (Signed)
EMTALA checked for completion  

## 2017-08-09 NOTE — ED Notes (Signed)
[facility] at the bedside

## 2017-08-09 NOTE — ED Notes (Signed)
Swallow study not to be performed due to pt nausea and vomiting per Dr. Lavone Neri.

## 2017-08-09 NOTE — ED Notes (Signed)
PT to CT with Marin Olp and Dr. Archie Balboa. Cardiac monitor in place.

## 2017-08-09 NOTE — ED Notes (Signed)
Report called to [facility]

## 2017-08-09 NOTE — ED Notes (Signed)
Dr. Archie Balboa at the bedside for pt evaluation. [place] monitor in place for neuro consult.

## 2017-08-09 NOTE — ED Notes (Signed)
Report given to [facility] 

## 2017-08-09 NOTE — ED Triage Notes (Signed)
Pt sitting in vehicle, actively vomiting; reports 66min PTA felt sudden onset dizziness then couldn't use her left hand; speech slow but clear; denies any pain, denies any hx of stroke; pt accomp by husband; pt assisted onto stretcher and taken immed to room 2; charge nurse notified & CT called; husband reports onset approx 6-615pm

## 2017-08-09 NOTE — ED Provider Notes (Signed)
[facility] Emergency Department Provider Note   ____________________________________________   I have reviewed the triage vital signs and the nursing notes.   HISTORY  Chief Complaint Weakness   History limited by: Not Limited   HPI Melody Ochoa is a 76 y.o. female who presents to the emergency department today because of concerns for left sided weakness. The patient states that her symptoms started around 6:15 to 6:30. The patient states she first noticed that she was feeling dizzy. As a sensation that she might pass out. She then felt like she could not move her left arm as normally as she should. She denies any numbness. No change in speech. No change in vision. No headache. No chest pain or shortness breath. Patient denies most symptoms in the past.   Past Medical History:  Diagnosis Date  . Arthritis   . Cancer ([place])    skin  . Depression   . Diverticulosis   . Dizziness    patient had episode of dizziness when came in room. hx past no dx  . GERD (gastroesophageal reflux disease)   . Heart murmur   . PONV (postoperative nausea and vomiting)    yrs ago    Patient Active Problem List   Diagnosis Date Noted  . Status post total replacement of right hip 04/22/2016  . Personal history of colonic polyps   . Back pain 11/22/2014  . UTI (lower urinary tract infection) 11/22/2014  . Sepsis ([place]) 11/22/2014  . Low back pain 11/22/2014  . Fever 11/22/2014  . Normocytic anemia 11/22/2014  . Spondylolisthesis of lumbar region 11/18/2014    Past Surgical History:  Procedure Laterality Date  . BACK SURGERY  [place] SURGERY  09  . COLONOSCOPY WITH PROPOFOL N/A 01/13/2016   Procedure: COLONOSCOPY WITH PROPOFOL;  Surgeon: [name], MD;  Location: [facility] ENDOSCOPY;  Service: Endoscopy;  Laterality: N/A;  . FACELIFT  94  . TONSILLECTOMY    . TOTAL HIP ARTHROPLASTY Right 04/22/2016   Procedure: TOTAL HIP ARTHROPLASTY;  Surgeon: Corky Mull, MD;  Location: [facility] ORS;  Service: Orthopedics;  Laterality: Right;    Prior to Admission medications   Medication Sig Start Date End Date Taking? Authorizing Provider  buPROPion (WELLBUTRIN SR) 200 MG 12 hr tablet Take 200 mg by mouth 2 (two) times daily. 10/06/14   [provider]  Calcium Carbonate-Vitamin D (CALTRATE 600+D PO) Take 1 tablet by mouth daily.    [provider]  Cholecalciferol (VITAMIN D PO) Take 1 tablet by mouth daily.    [provider]  diazepam (VALIUM) 2 MG tablet Take 2 mg by mouth 3 (three) times daily as needed for anxiety or muscle spasms.    [provider]  DULoxetine (CYMBALTA) 60 MG capsule Take 120 mg by mouth daily.     [provider]  enoxaparin (LOVENOX) 40 MG/0.4ML injection Inject 0.4 mLs (40 mg total) into the skin daily. Patient not taking: Reported on 09/29/2016 04/25/16   Lattie Corns, PA-C  ferrous sulfate 325 (65 FE) MG tablet Take 1 tablet (325 mg total) by mouth 3 (three) times daily with meals. Patient taking differently: Take 325 mg by mouth daily.  11/25/14   Caren Griffins, MD  gabapentin (NEURONTIN) 300 MG capsule Take 600 mg by mouth at bedtime.  09/01/14   [provider]  HYDROcodone-acetaminophen (NORCO) 10-325 MG tablet Take 0.5-1 tablets by mouth every 8 (eight) hours as needed.  [provider]  meclizine (ANTIVERT) 25 MG tablet Take 1 tablet (25 mg total) by mouth 3 (three) times daily as needed for dizziness. Patient not taking: Reported on 09/29/2016 01/24/16   Nance Pear, MD  pantoprazole (PROTONIX) 40 MG tablet Take 40 mg by mouth daily. 10/16/14   [provider]  traMADol (ULTRAM) 50 MG tablet Take 1-2 tablets (50-100 mg total) by mouth every 6 (six) hours. Patient not taking: Reported on 09/29/2016 04/25/16   Lattie Corns, PA-C    Allergies Aleve [naproxen sodium]; Aspirin; Celebrex [celecoxib]; Effexor [venlafaxine]; Ibuprofen;  Vioxx [rofecoxib]; Other; and Tramadol  No family history on file.  Social History Social History  Substance Use Topics  . Smoking status: Former Smoker    Packs/day: 1.00    Years: 40.00    Types: Cigarettes    Quit date: 11/13/1996  . Smokeless tobacco: Not on file  . Alcohol use Yes     Comment: occ wine    Review of Systems Constitutional: No fever/chills Eyes: No visual changes. ENT: No sore throat. Cardiovascular: Denies chest pain. Respiratory: Denies shortness of breath. Gastrointestinal: No abdominal pain.  No nausea, no vomiting.  No diarrhea.   Genitourinary: Negative for dysuria. Musculoskeletal: Negative for back pain. Skin: Negative for rash. Neurological: positive for left-sided weakness  ____________________________________________   PHYSICAL EXAM:  VITAL SIGNS: ED Triage Vitals  Enc Vitals Group     BP 203/97     Pulse 73     Resp 10     Temp      Temp src      SpO2 97   Constitutional: Alert and oriented.  Eyes: Conjunctivae are normal.  ENT   Head: Normocephalic and atraumatic.   Nose: No congestion/rhinnorhea.   Mouth/Throat: Mucous membranes are moist.   Neck: No stridor. Hematological/Lymphatic/Immunilogical: No cervical lymphadenopathy. Cardiovascular: Normal rate, regular rhythm.  No murmurs, rubs, or gallops. Respiratory: Normal respiratory effort without tachypnea nor retractions. Breath sounds are clear and equal bilaterally. No wheezes/rales/rhonchi. Gastrointestinal: Soft and non tender. No rebound. No guarding.  Genitourinary: Deferred Musculoskeletal: Normal range of motion in all extremities. No lower extremity edema. Neurologic:  Normal speech and language. Face symmetric. Tongue midline. Patient with slight pronator drift on the left upper extremity. Grip strength 5 out of 5 bilaterally. Sensation intact in the upper extremities. Lower extremities show strength 4+ out of 5 in the left leg 5 out of 5 in the right  leg. Sensation grossly intact in the lower extremity. NIHSS 3 Skin:  Skin is warm, dry and intact. No rash noted. Psychiatric: Mood and affect are normal. Speech and behavior are normal. Patient exhibits appropriate insight and judgment.  ____________________________________________    LABS (pertinent positives/negatives)  Labs Reviewed  COMPREHENSIVE METABOLIC PANEL - Abnormal; Notable for the following:       Result Value   Glucose, Bld 150 (*)    Creatinine, Ser 1.06 (*)    GFR calc non Af Amer 49 (*)    GFR calc Af Amer 57 (*)    All other components within normal limits  TROPONIN I - Abnormal; Notable for the following:    Troponin I 0.03 (*)    All other components within normal limits  GLUCOSE, CAPILLARY - Abnormal; Notable for the following:    Glucose-Capillary 130 (*)    All other components within normal limits  ETHANOL  PROTIME-INR  APTT  CBC  DIFFERENTIAL  URINE DRUG SCREEN, QUALITATIVE ([facility] ONLY)  URINALYSIS, ROUTINE W  REFLEX MICROSCOPIC     ____________________________________________   EKG  I, Nance Pear, attending physician, personally viewed and interpreted this EKG  EKG Time: 1945 Rate: 74 Rhythm: normal sinus rhythm Axis: normal Intervals: qtc 489 QRS: borderline IVCD ST changes: no st elevation Impression: abnormal ekg  ____________________________________________    RADIOLOGY  CT head IMPRESSION:  1. No intracranial hemorrhage or definite acute infarct identified.  2. New left parietal infarct, likely subacute.  3. Small chronic left cerebellar infarct.  4. ASPECTS is 10.   I, Sharmila Wrobleski, personally discussed these images and results by phone with the on-call radiologist and used this discussion as part of my medical decision making.     ____________________________________________   PROCEDURES  Procedures  CRITICAL CARE Performed by: Nance Pear   Total critical care time: 50 minutes  Critical care  time was exclusive of separately billable procedures and treating other patients.  Critical care was necessary to treat or prevent imminent or life-threatening deterioration.  Critical care was time spent personally by me on the following activities: development of treatment plan with patient and/or surrogate as well as nursing, discussions with consultants, evaluation of patient's response to treatment, examination of patient, obtaining history from patient or surrogate, ordering and performing treatments and interventions, ordering and review of laboratory studies, ordering and review of radiographic studies, pulse oximetry and re-evaluation of patient's condition.  ____________________________________________   INITIAL IMPRESSION / ASSESSMENT AND PLAN / ED COURSE  Pertinent labs & imaging results that were available during my care of the patient were reviewed by me and considered in my medical decision making (see chart for details).  Patient presented to the emergency department today because of concerns for left sided weakness. This did it starta little over 1 hour prior to presentation. On my exam patient did have some weakness and drift of the both the left upper and lower extremity with some ataxia. Given this finding given the acute nature patient was called a code stroke. Patient was evaluated by neurologist on call who did recommend TPA. TPA was administered after blood pressure control with IV medication. Shortly after TPA was started patient was complaining of head ache. I had the nurses positive TPA while CT scans could be performed.I did not see any acute bleed it is so then I instructed the nurses to restart the TPA. Patient will be transferred to [facility]. On reassessment after the TPA finish patient did state she felt like her strength was a little bit better and her left arm. Headache had not gotten any worse.  ____________________________________________   FINAL CLINICAL  IMPRESSION(S) / ED DIAGNOSES  Final diagnoses:  Left-sided weakness  Cerebrovascular accident (CVA), unspecified mechanism ([place])     Note: This dictation was prepared with Dragon dictation. Any transcriptional errors that result from this process are unintentional     Nance Pear, MD 08/09/17 2302

## 2017-08-09 NOTE — ED Notes (Signed)
Pt states she is feeling badly. Developed a headache 4/10 pain and reoccurring nausea. Dr. Archie Balboa aware.

## 2017-08-09 NOTE — ED Notes (Signed)
telestroke started with  Dr. Lavone Neri.

## 2017-08-09 NOTE — ED Notes (Signed)
Purewick catheter applied to pt due to not being successful on bedpan.

## 2017-08-09 NOTE — ED Notes (Signed)
Pt states her headache and nausea area improving. Resting quietly awaiting [facility]. No distress noted.

## 2017-08-09 NOTE — ED Notes (Signed)
Pt returned from CT °

## 2017-08-09 NOTE — ED Notes (Signed)
Dr. Lavone Neri requested pt blood pressure to be "below 185" prior to administering TPA. TPA to be held until blood pressure improves. Orders received.

## 2017-08-09 NOTE — ED Notes (Signed)
[facility] arrived to transport pt 

## 2017-08-09 NOTE — ED Notes (Signed)
Pt to CT via stretcher accomp by CT tech 

## 2017-08-09 NOTE — Progress Notes (Signed)
[place] received a PG for a Code   08/09/17 1955  Clinical Encounter Type  Visited With Patient and family together  Visit Type Code  Referral From Nurse  Consult/Referral To Chaplain  Spiritual Encounters  Spiritual Needs Prayer;Emotional   Stroke. [place] reported to ED-2 and provided encouragement and prayer.

## 2017-08-10 ENCOUNTER — Inpatient Hospital Stay (HOSPITAL_COMMUNITY): Payer: Medicare Other

## 2017-08-10 ENCOUNTER — Encounter (HOSPITAL_COMMUNITY): Payer: Self-pay | Admitting: Emergency Medicine

## 2017-08-10 ENCOUNTER — Inpatient Hospital Stay (HOSPITAL_COMMUNITY)
Admission: EM | Admit: 2017-08-10 | Discharge: 2017-08-13 | DRG: 065 | Disposition: A | Discharge status: Home Health | Payer: Medicare Other | Source: Other Acute Inpatient Hospital | Attending: Neurology | Admitting: Neurology

## 2017-08-10 ENCOUNTER — Other Ambulatory Visit (HOSPITAL_COMMUNITY): Payer: Medicare Other

## 2017-08-10 DIAGNOSIS — F329 Major depressive disorder, single episode, unspecified: Secondary | ICD-10-CM | POA: Diagnosis present

## 2017-08-10 DIAGNOSIS — Z885 Allergy status to narcotic agent status: Secondary | ICD-10-CM | POA: Diagnosis not present

## 2017-08-10 DIAGNOSIS — I639 Cerebral infarction, unspecified: Secondary | ICD-10-CM | POA: Diagnosis present

## 2017-08-10 DIAGNOSIS — I6340 Cerebral infarction due to embolism of unspecified cerebral artery: Principal | ICD-10-CM | POA: Diagnosis present

## 2017-08-10 DIAGNOSIS — N390 Urinary tract infection, site not specified: Secondary | ICD-10-CM | POA: Diagnosis not present

## 2017-08-10 DIAGNOSIS — G8929 Other chronic pain: Secondary | ICD-10-CM | POA: Diagnosis present

## 2017-08-10 DIAGNOSIS — E669 Obesity, unspecified: Secondary | ICD-10-CM | POA: Diagnosis present

## 2017-08-10 DIAGNOSIS — M4316 Spondylolisthesis, lumbar region: Secondary | ICD-10-CM | POA: Diagnosis not present

## 2017-08-10 DIAGNOSIS — Z85828 Personal history of other malignant neoplasm of skin: Secondary | ICD-10-CM

## 2017-08-10 DIAGNOSIS — Z886 Allergy status to analgesic agent status: Secondary | ICD-10-CM

## 2017-08-10 DIAGNOSIS — R270 Ataxia, unspecified: Secondary | ICD-10-CM | POA: Diagnosis not present

## 2017-08-10 DIAGNOSIS — Z96641 Presence of right artificial hip joint: Secondary | ICD-10-CM | POA: Diagnosis present

## 2017-08-10 DIAGNOSIS — I10 Essential (primary) hypertension: Secondary | ICD-10-CM | POA: Diagnosis present

## 2017-08-10 DIAGNOSIS — E785 Hyperlipidemia, unspecified: Secondary | ICD-10-CM | POA: Diagnosis present

## 2017-08-10 DIAGNOSIS — I083 Combined rheumatic disorders of mitral, aortic and tricuspid valves: Secondary | ICD-10-CM | POA: Diagnosis not present

## 2017-08-10 DIAGNOSIS — K219 Gastro-esophageal reflux disease without esophagitis: Secondary | ICD-10-CM | POA: Diagnosis present

## 2017-08-10 DIAGNOSIS — Z888 Allergy status to other drugs, medicaments and biological substances status: Secondary | ICD-10-CM | POA: Diagnosis not present

## 2017-08-10 DIAGNOSIS — R29701 NIHSS score 1: Secondary | ICD-10-CM | POA: Diagnosis present

## 2017-08-10 DIAGNOSIS — I6300 Cerebral infarction due to thrombosis of unspecified precerebral artery: Secondary | ICD-10-CM

## 2017-08-10 DIAGNOSIS — Z6833 Body mass index (BMI) 33.0-33.9, adult: Secondary | ICD-10-CM

## 2017-08-10 DIAGNOSIS — I340 Nonrheumatic mitral (valve) insufficiency: Secondary | ICD-10-CM | POA: Diagnosis not present

## 2017-08-10 DIAGNOSIS — I371 Nonrheumatic pulmonary valve insufficiency: Secondary | ICD-10-CM | POA: Diagnosis not present

## 2017-08-10 DIAGNOSIS — M549 Dorsalgia, unspecified: Secondary | ICD-10-CM | POA: Diagnosis present

## 2017-08-10 DIAGNOSIS — I63019 Cerebral infarction due to thrombosis of unspecified vertebral artery: Secondary | ICD-10-CM

## 2017-08-10 DIAGNOSIS — I672 Cerebral atherosclerosis: Secondary | ICD-10-CM | POA: Diagnosis present

## 2017-08-10 DIAGNOSIS — I63212 Cerebral infarction due to unspecified occlusion or stenosis of left vertebral arteries: Secondary | ICD-10-CM | POA: Diagnosis not present

## 2017-08-10 DIAGNOSIS — Z87891 Personal history of nicotine dependence: Secondary | ICD-10-CM

## 2017-08-10 DIAGNOSIS — R42 Dizziness and giddiness: Secondary | ICD-10-CM | POA: Diagnosis not present

## 2017-08-10 DIAGNOSIS — Z9282 Status post administration of tPA (rtPA) in a different facility within the last 24 hours prior to admission to current facility: Secondary | ICD-10-CM

## 2017-08-10 DIAGNOSIS — I638 Other cerebral infarction: Secondary | ICD-10-CM | POA: Diagnosis not present

## 2017-08-10 LAB — LIPID PANEL
Cholesterol: 199 mg/dL (ref 0–200)
HDL: 65 mg/dL (ref >40)
LDL Cholesterol: 126 mg/dL — ABNORMAL HIGH (ref 0–99)
Total CHOL/HDL Ratio: 3.1 RATIO
Triglycerides: 39 mg/dL (ref <150)
VLDL: 8 mg/dL (ref 0–40)

## 2017-08-10 LAB — HEMOGLOBIN A1C
Hgb A1c MFr Bld: 5.3 % (ref 4.8–5.6)
Mean Plasma Glucose: 105.41 mg/dL

## 2017-08-10 LAB — MRSA PCR SCREENING: MRSA by PCR: NEGATIVE

## 2017-08-10 MED ORDER — ACETAMINOPHEN 160 MG/5ML PO SOLN: 650.0000 mg | ORAL | Status: DC | PRN

## 2017-08-10 MED ORDER — PANTOPRAZOLE SODIUM 40 MG PO TBEC
40.0000 mg | DELAYED_RELEASE_TABLET | Freq: Every day | ORAL | Status: DC
  Administered 2017-08-10 – 2017-08-12 (×3): 40 mg via ORAL
  Filled 2017-08-10 (×3): qty 1

## 2017-08-10 MED ORDER — STROKE: EARLY STAGES OF RECOVERY BOOK
Freq: Once | Status: AC
  Administered 2017-08-10: 02:00:00
  Filled 2017-08-10: qty 1

## 2017-08-10 MED ORDER — CLOPIDOGREL BISULFATE 75 MG PO TABS
75.0000 mg | ORAL_TABLET | Freq: Every day | ORAL | Status: DC
  Administered 2017-08-10 – 2017-08-13 (×4): 75 mg via ORAL
  Filled 2017-08-10 (×4): qty 1

## 2017-08-10 MED ORDER — ACETAMINOPHEN 650 MG RE SUPP: 650.0000 mg | RECTAL | Status: DC | PRN

## 2017-08-10 MED ORDER — DULOXETINE HCL 60 MG PO CPEP
120.0000 mg | ORAL_CAPSULE | Freq: Every day | ORAL | Status: DC
  Administered 2017-08-10 – 2017-08-13 (×4): 120 mg via ORAL
  Filled 2017-08-10 (×4): qty 2

## 2017-08-10 MED ORDER — PANTOPRAZOLE SODIUM 40 MG IV SOLR: 40.0000 mg | Freq: Every day | INTRAVENOUS | Status: DC

## 2017-08-10 MED ORDER — ACETAMINOPHEN 325 MG PO TABS
650.0000 mg | ORAL_TABLET | ORAL | Status: DC | PRN
  Administered 2017-08-10: 650 mg via ORAL
  Filled 2017-08-10: qty 2

## 2017-08-10 MED ORDER — BUPROPION HCL ER (XL) 150 MG PO TB24
300.0000 mg | ORAL_TABLET | Freq: Every day | ORAL | Status: DC
  Administered 2017-08-10 – 2017-08-13 (×4): 300 mg via ORAL
  Filled 2017-08-10 (×3): qty 2
  Filled 2017-08-10: qty 1

## 2017-08-10 MED ORDER — SODIUM CHLORIDE 0.9 % IV SOLN
INTRAVENOUS | Status: DC
  Administered 2017-08-10 – 2017-08-11 (×3): via INTRAVENOUS

## 2017-08-10 MED ORDER — ACETAMINOPHEN-CODEINE #3 300-30 MG PO TABS
2.0000 | ORAL_TABLET | Freq: Four times a day (QID) | ORAL | Status: DC | PRN
  Administered 2017-08-10 – 2017-08-12 (×6): 2 via ORAL
  Filled 2017-08-10 (×6): qty 2

## 2017-08-10 NOTE — Progress Notes (Addendum)
Occupational Therapy Evaluation Patient Details Name: Melody Ochoa MRN: [id number] DOB: Feb 27, 1941 Today's Date: 08/10/2017    History of Present Illness 76 year old female with acute onset vertigo and left-sided weakness/ataxia, Patchy small volume acute ischemic nonhemorrhagic superior left cerebellar infarcts seen on MRI.    Clinical Impression   PTA, pt lived at home with her husband and was independent with ADL and mobility. Pt presents with functional decline, requiring mod A with mobility and ADL due to below listed deficits. Pt with significant LOB with head turns, L bias with midline postural orientation and ataxia. Feel pt will benefit from rehab at CIR to maximize functional level of independence to facilitate safe DC home. Will follow acutely to address established goals and facilitate DC to next venue of care.     Follow Up Recommendations  CIR;Supervision/Assistance - 24 hour    Equipment Recommendations  Other (comment) (TBA)    Recommendations for Other Services Rehab consult     Precautions / Restrictions Precautions Precautions: Fall Restrictions Weight Bearing Restrictions: No      Mobility Bed Mobility               General bed mobility comments: OOB in chair  Transfers Overall transfer level: Needs assistance Equipment used: None Transfers: Sit to/from Stand Sit to Stand: Min assist              Balance Overall balance assessment: Needs assistance   Sitting balance-Leahy Scale: Fair Sitting balance - Comments: able to sit EOB without over instability or lean   Standing balance support: During functional activity Standing balance-Leahy Scale: Poor Standing balance comment: reliance on physical assist for stability             High level balance activites: Head turns High Level Balance Comments: Max assist 2 person required to prevent fall, unable to compensate or correct           ADL either performed or assessed with  clinical judgement   ADL Overall ADL's : Needs assistance/impaired     Grooming: Set up;Sitting   Upper Body Bathing: Set up;Sitting   Lower Body Bathing: Minimal assistance;Sit to/from stand   Upper Body Dressing : Minimal assistance;Sitting   Lower Body Dressing: Moderate assistance;Sit to/from stand   Toilet Transfer: Moderate assistance Toilet Transfer Details (indicate cue type and reason): L bias. increased difficulty with head turns Toileting- Clothing Manipulation and Hygiene: Minimal assistance;Sit to/from stand       Functional mobility during ADLs: Moderate assistance General ADL Comments: Pt initially mobilizing well until she began to move her head, then requried mod A due to poor midline postural orientation with L bias     Vision Baseline Vision/History: Wears glasses Patient Visual Report: No change from baseline Additional Comments: Difficulty with smooth pursuits     Perception     Praxis      Pertinent Vitals/Pain Pain Assessment: No/denies pain     Hand Dominance Right   Extremity/Trunk Assessment Upper Extremity Assessment Upper Extremity Assessment: LUE deficits/detail LUE Deficits / Details: AROM and strength WFL. Incoordination noted LUE Coordination: decreased fine motor   Lower Extremity Assessment Lower Extremity Assessment: LLE deficits/detail LLE Coordination: decreased fine motor;decreased gross motor   Cervical / Trunk Assessment Cervical / Trunk Assessment: Other exceptions Cervical / Trunk Exceptions: L bias   Communication Communication Communication:  (mild dysarthria)   Cognition Arousal/Alertness: Awake/alert Behavior During Therapy: Anxious Overall Cognitive Status:  (will further assess)  Decreased insight/awareness into deficits and safety. Pt  appeared upset about losing her balance and requiring A +2 to mobilize to chair and staetd " I never look up when I walk, that's why I lost my balance".       Maurie Boettcher,  OTR/L  [phone] 08/10/2017                               General Comments       Exercises     Shoulder Instructions      Home Living Family/patient expects to be discharged to:: Private residence Living Arrangements: Spouse/significant other Available Help at Discharge: Family Type of Home: House Home Access: Stairs to enter [employer] of Steps: 2 Entrance Stairs-Rails: Can reach both Home Layout: One level     Bathroom Shower/Tub: Tub only;Walk-in shower   Bathroom Toilet: Handicapped height Bathroom Accessibility: Yes How Accessible: Accessible via walker Home Equipment: Cane - single point;Walker - 2 wheels;Toilet riser          Prior Functioning/Environment Level of Independence: Independent        Comments: States she was driving        OT Problem List: Decreased activity tolerance;Impaired balance (sitting and/or standing);Decreased coordination;Decreased safety awareness;Decreased knowledge of use of DME or AE      OT Treatment/Interventions: Self-care/ADL training;Neuromuscular education;DME and/or AE instruction;Therapeutic activities;Patient/family education;Balance training    OT Goals(Current goals can be found in the care plan section) Acute Rehab OT Goals Patient Stated Goal: to get better OT Goal Formulation: With patient Time For Goal Achievement: 08/24/17 Potential to Achieve Goals: Good  OT Frequency: Min 2X/week   Barriers to D/C:            Co-evaluation              AM-PAC PT "6 Clicks" Daily Activity     Outcome Measure Help from another person eating meals?: None Help from another person taking care of personal grooming?: A Little Help from another person toileting, which includes using toliet, bedpan, or urinal?: A Lot Help from another person bathing (including washing, rinsing, drying)?: A Little Help from another person to put on and taking off regular upper body clothing?: A Little Help from  another person to put on and taking off regular lower body clothing?: A Lot 6 Click Score: 17   End of Session Nurse Communication: Mobility status  Activity Tolerance: Patient tolerated treatment well Patient left: in chair;with call bell/phone within reach;with family/visitor present  OT Visit Diagnosis: Other abnormalities of gait and mobility (R26.89);Ataxia, unspecified (R27.0);Dizziness and giddiness (R42)                Time: 0932-6712 OT Time Calculation (min): 16 min Charges:  OT General Charges $OT Visit: 1 Visit OT Evaluation $OT Eval Moderate Complexity: 1 Mod G-Codes:     [place], OT/L  [phone] 08/10/2017  Corinna Burkman,HILLARY 08/10/2017, 5:41 PM

## 2017-08-10 NOTE — Progress Notes (Signed)
PT Cancellation Note  Patient Details Name: Melody Ochoa MRN: [id number] DOB: [birth year]   Cancelled Treatment:    Reason Eval/Treat Not Completed: Patient not medically ready. Pt currently on bedrest. Will await increased activity orders prior to initiating PT eval.    Thelma Comp 08/10/2017, 7:55 AM   Rolinda Roan, PT, DPT Acute Rehabilitation Services Pager: [phone]

## 2017-08-10 NOTE — Evaluation (Signed)
Speech Language Pathology Evaluation Patient Details Name: Melody Ochoa MRN: [id number] DOB: [birth year] Today's Date: 08/10/2017 Time: 9242-6834 SLP Time Calculation (min) (ACUTE ONLY): 15 min  Problem List:  Patient Active Problem List   Diagnosis Date Noted  . Stroke (cerebrum) ([place]) 08/10/2017  . Status post total replacement of right hip 04/22/2016  . Personal history of colonic polyps   . Back pain 11/22/2014  . UTI (lower urinary tract infection) 11/22/2014  . Sepsis ([place]) 11/22/2014  . Low back pain 11/22/2014  . Fever 11/22/2014  . Normocytic anemia 11/22/2014  . Spondylolisthesis of lumbar region 11/18/2014   Past Medical History:  Past Medical History:  Diagnosis Date  . Arthritis   . Back pain   . Cancer ([place])    skin  . Depression   . Diverticulosis   . Dizziness    patient had episode of dizziness when came in room. hx past no dx  . GERD (gastroesophageal reflux disease)   . Heart murmur   . PONV (postoperative nausea and vomiting)    yrs ago   Past Surgical History:  Past Surgical History:  Procedure Laterality Date  . BACK SURGERY  [place] SURGERY  09  . COLONOSCOPY WITH PROPOFOL N/A 01/13/2016   Procedure: COLONOSCOPY WITH PROPOFOL;  Surgeon: [name], MD;  Location: [facility] ENDOSCOPY;  Service: Endoscopy;  Laterality: N/A;  . FACELIFT  94  . TONSILLECTOMY    . TOTAL HIP ARTHROPLASTY Right 04/22/2016   Procedure: TOTAL HIP ARTHROPLASTY;  Surgeon: Corky Mull, MD;  Location: [facility] ORS;  Service: Orthopedics;  Laterality: Right;   HPI:  76 y.o.femalewho presents with acute onset dizziness started around 6:15 to 6:30 PM. She presented [place] [facility] where she was evaluated by neurology and felt to be having a small cerebellar or brainstem stroke. She was treated with IV TPA and transferred to [facility]. She has had markedimprovement in the interim   Assessment / Plan / Recommendation Clinical Impression   Pt presents with grossly intact  cognitive-linguistic function for age.  Pt's speech is mildly dysarthric due to slight right sided oral motor weakness.  While pt is intelligible in conversations, pt's speech production does appear more effortful and it took pt more than a reasonable amount of time for precise articulation of consonants.  This could further be compounded by stitches from recent lip laceration.  Given pending MRI, pt would benefit from 1-2 additional ST treatment sessions while inpatient to monitor speech and provide education regarding intelligibility strategies as needed.  No ST needs indicated post discharge.      SLP Assessment  SLP Recommendation/Assessment: Patient needs continued Speech Lanaguage Pathology Services SLP Visit Diagnosis: Dysarthria and anarthria (R47.1)    Follow Up Recommendations  None    Frequency and Duration min 1 x/week  1 week      SLP Evaluation Cognition  Overall Cognitive Status: Within Functional Limits for tasks assessed Orientation Level: Oriented X4       Comprehension  Auditory Comprehension Overall Auditory Comprehension: Appears within functional limits for tasks assessed    Expression Expression Primary Mode of Expression: Verbal Verbal Expression Overall Verbal Expression: Appears within functional limits for tasks assessed Other Verbal Expression Comments: pt reports baseline word finding deficits, not apparent during today's evaluation   Oral / Motor  Oral Motor/Sensory Function Overall Oral Motor/Sensory Function: Mild impairment Facial ROM: Within Functional Limits Facial Symmetry: Abnormal symmetry right Facial Strength: Within Functional Limits Facial Sensation: Within Functional Limits  Lingual ROM: Within Functional Limits Lingual Symmetry: Abnormal symmetry right Lingual Strength: Within Functional Limits Lingual Sensation: Within Functional Limits Motor Speech Overall Motor Speech: Impaired Respiration: Within functional limits Articulation:  Impaired Level of Impairment: Conversation Intelligibility: Intelligible   GO                    Melody Ochoa, Melody Ochoa 08/10/2017, 11:43 AM

## 2017-08-10 NOTE — Progress Notes (Signed)
Stroke Team Progress Note  SUBJECTIVE  Melody Ochoa is a 76 y.o. female who  presented to [name] on 08/09/17  with acute onset dizziness started around 6:15 to 6:30 PM. She   was evaluated by teleneurology and felt to be having a small cerebellar or brainstem stroke. She was treated with IV TPA and transferred to [facility] She has had marked improvement in the interim LKW: 6:30 PM 08/09/17 tpa given?: Yes at [facility] NIHSS: 1  This morning she is sitting at the bedside working with therapist. She feels her dizziness and balance a lot improved. She denies any headache. She still feels off balance and has some coordination difficulties. She had an MRI scan the brain done which I personally reviewed shows small patchy left superior cerebellar infarct. There is also evidence of old left cerebellar as well as old left high parietal cortical infarct. She denies any prior history of strokes or any neurological workup. She states she did have a similar episode of dizziness in the past and was seen in the emergency room and was sent home and did not get a complete stroke workup. OBJECTIVE Most recent Vital Signs: Temp: 98.3 F (36.8 C) (09/05 1600) Temp Source: Oral (09/05 1600) BP: 151/71 (09/05 1700) Pulse Rate: 81 (09/05 1700) Respiratory Rate: 20 O2 Saturdation: 100%  CBG (last 3)   Recent Labs  08/09/17 2008  GLUCAP 130*    Diet: Diet regular Room service appropriate? Yes; Fluid consistency: Thin   liquids  Activity: Up with assistance   VTE Prophylaxis:   SCDs  Studies: Results for orders placed or performed during the hospital encounter of 08/10/17 (from the past 24 hour(s))  MRSA PCR Screening     Status: None   Collection Time: 08/10/17 12:20 AM  Result Value Ref Range   MRSA by PCR NEGATIVE NEGATIVE  Hemoglobin A1c     Status: None   Collection Time: 08/10/17  2:30 AM  Result Value Ref Range   Hgb A1c MFr Bld 5.3 4.8 - 5.6 %   Mean Plasma Glucose 105.41 mg/dL  Lipid panel      Status: Abnormal   Collection Time: 08/10/17  2:30 AM  Result Value Ref Range   Cholesterol 199 0 - 200 mg/dL   Triglycerides 39 <150 mg/dL   HDL 65 >40 mg/dL   Total CHOL/HDL Ratio 3.1 RATIO   VLDL 8 0 - 40 mg/dL   LDL Cholesterol 126 (H) 0 - 99 mg/dL     Ct Angio Head W Or Wo Contrast  Result Date: 08/09/2017 CLINICAL DATA:  Initial evaluation for acute dizziness, left upper extremity weakness. EXAM: CT ANGIOGRAPHY HEAD AND NECK TECHNIQUE: Multidetector CT imaging of the head and neck was performed using the standard protocol during bolus administration of intravenous contrast. Multiplanar CT image reconstructions and MIPs were obtained to evaluate the vascular anatomy. Carotid stenosis measurements (when applicable) are obtained utilizing NASCET criteria, using the distal internal carotid diameter as the denominator. CONTRAST:  75 cc of Isovue 370. COMPARISON:  Prior CT from earlier the same day. FINDINGS: CT HEAD FINDINGS Brain: Stable atrophy with chronic microvascular ischemic disease. Remote left occipital and cerebellar infarcts. No acute intracranial hemorrhage. No acute large vessel territory infarct. No mass lesion, midline shift or mass effect. No hydrocephalus. No extra-axial fluid collection. Vascular: No hyperdense vessel. Skull: Scalp soft tissues and calvarium within normal limits. Sinuses: Visualized paranasal sinuses and mastoid air cells are clear. Orbits: Visualized globes and orbital soft tissues within normal  limits. Review of the MIP images confirms the above findings CTA NECK FINDINGS Aortic arch: Visualized aortic arch of normal caliber with normal branch pattern. Scattered atheromatous plaque within the arch and about the origin of the great vessels without flow-limiting stenosis. Partially visualized subclavian artery is widely patent. Right carotid system: Right common carotid artery patent from its origin to the bifurcation without stenosis. Eccentric calcified plaque about  the right carotid bifurcation without flow-limiting stenosis. Right ICA patent distally to the skullbase without stenosis, dissection, or occlusion. Left carotid system: Left common carotid artery patent from its origin to the bifurcation without stenosis. Scattered calcified plaque about the left carotid bifurcation without flow-limiting stenosis. Left ICA patent distally to the skullbase without stenosis, dissection, or occlusion. Vertebral arteries: Both of the vertebral arteries arise from the subclavian arteries. Scattered atheromatous irregularity within the mid to lower without flow-limiting stenosis. Vertebral arteries patent within the neck without dissection or occlusion. Skeleton: No acute osseous abnormality. No worrisome lytic or blastic osseous lesions. Sequelae of prior posterior decompression with fusion noted within cervical spine. Moderate multilevel degenerative spondylolysis, greatest at C4-5 through C6-7. Other neck: No acute soft tissue abnormality within the neck. Salivary glands within normal limits. Thyroid normal. No adenopathy. Upper chest: Visualized upper mediastinum within normal limits. Scattered irregular biapical pleuroparenchymal scarring with bronchiectasis noted. Associated biapical fibrotic lung changes. Review of the MIP images confirms the above findings CTA HEAD FINDINGS Anterior circulation: Petrous segments widely patent bilaterally. Scattered atheromatous plaque within the cavernous/ supraclinoid ICAs without flow-limiting stenosis. ICA termini widely patent. A1 segments patent bilaterally. Anterior communicating artery normal. Anterior cerebral arteries demonstrate scattered atheromatous irregularity but are patent to their distal aspects without flow-limiting stenosis. M1 segments patent without stenosis or occlusion. MCA bifurcations normal. No proximal M2 occlusion. Distal MCA branches well opacified and symmetric. Distal small vessel atheromatous irregularity noted.  Posterior circulation: Focal plaque noted at the dominant left vertebral artery as it crosses the dural margin with associated mild stenosis. Vertebral artery's patent to the vertebrobasilar junction without flow-limiting stenosis. Right PICA patent. Left PICA not visualized. Basilar artery tortuous but widely patent to its distal aspect. Superior cerebral arteries patent bilaterally. Hypoplastic right P1 segment with prominent right posterior communicating artery. Fetal type origin of the left PCA supplied via the left posterior communicating artery. PCAs patent to their distal aspects without flow-limiting stenosis. Venous sinuses: Patent. Anatomic variants: Fetal type origin of left PCA. No aneurysm or vascular malformation. Delayed phase: Not performed. Review of the MIP images confirms the above findings IMPRESSION: 1. Negative CTA for emergent large vessel occlusion. 2. Moderate atherosclerotic disease involving the major arterial vasculature of the head and neck as above, most evident about the aortic arch and origin of the great vessels. No high-grade or flow-limiting stenosis identified. 3. Stable appearance of the brain. No acute intracranial hemorrhage status post tPA administration. Electronically Signed   By: Jeannine Boga M.D.   On: 08/09/2017 22:56   Ct Angio Neck W And/or Wo Contrast  Result Date: 08/09/2017 CLINICAL DATA:  Initial evaluation for acute dizziness, left upper extremity weakness. EXAM: CT ANGIOGRAPHY HEAD AND NECK TECHNIQUE: Multidetector CT imaging of the head and neck was performed using the standard protocol during bolus administration of intravenous contrast. Multiplanar CT image reconstructions and MIPs were obtained to evaluate the vascular anatomy. Carotid stenosis measurements (when applicable) are obtained utilizing NASCET criteria, using the distal internal carotid diameter as the denominator. CONTRAST:  75 cc of Isovue 370. COMPARISON:  Prior  CT from earlier the  same day. FINDINGS: CT HEAD FINDINGS Brain: Stable atrophy with chronic microvascular ischemic disease. Remote left occipital and cerebellar infarcts. No acute intracranial hemorrhage. No acute large vessel territory infarct. No mass lesion, midline shift or mass effect. No hydrocephalus. No extra-axial fluid collection. Vascular: No hyperdense vessel. Skull: Scalp soft tissues and calvarium within normal limits. Sinuses: Visualized paranasal sinuses and mastoid air cells are clear. Orbits: Visualized globes and orbital soft tissues within normal limits. Review of the MIP images confirms the above findings CTA NECK FINDINGS Aortic arch: Visualized aortic arch of normal caliber with normal branch pattern. Scattered atheromatous plaque within the arch and about the origin of the great vessels without flow-limiting stenosis. Partially visualized subclavian artery is widely patent. Right carotid system: Right common carotid artery patent from its origin to the bifurcation without stenosis. Eccentric calcified plaque about the right carotid bifurcation without flow-limiting stenosis. Right ICA patent distally to the skullbase without stenosis, dissection, or occlusion. Left carotid system: Left common carotid artery patent from its origin to the bifurcation without stenosis. Scattered calcified plaque about the left carotid bifurcation without flow-limiting stenosis. Left ICA patent distally to the skullbase without stenosis, dissection, or occlusion. Vertebral arteries: Both of the vertebral arteries arise from the subclavian arteries. Scattered atheromatous irregularity within the mid to lower without flow-limiting stenosis. Vertebral arteries patent within the neck without dissection or occlusion. Skeleton: No acute osseous abnormality. No worrisome lytic or blastic osseous lesions. Sequelae of prior posterior decompression with fusion noted within cervical spine. Moderate multilevel degenerative spondylolysis,  greatest at C4-5 through C6-7. Other neck: No acute soft tissue abnormality within the neck. Salivary glands within normal limits. Thyroid normal. No adenopathy. Upper chest: Visualized upper mediastinum within normal limits. Scattered irregular biapical pleuroparenchymal scarring with bronchiectasis noted. Associated biapical fibrotic lung changes. Review of the MIP images confirms the above findings CTA HEAD FINDINGS Anterior circulation: Petrous segments widely patent bilaterally. Scattered atheromatous plaque within the cavernous/ supraclinoid ICAs without flow-limiting stenosis. ICA termini widely patent. A1 segments patent bilaterally. Anterior communicating artery normal. Anterior cerebral arteries demonstrate scattered atheromatous irregularity but are patent to their distal aspects without flow-limiting stenosis. M1 segments patent without stenosis or occlusion. MCA bifurcations normal. No proximal M2 occlusion. Distal MCA branches well opacified and symmetric. Distal small vessel atheromatous irregularity noted. Posterior circulation: Focal plaque noted at the dominant left vertebral artery as it crosses the dural margin with associated mild stenosis. Vertebral artery's patent to the vertebrobasilar junction without flow-limiting stenosis. Right PICA patent. Left PICA not visualized. Basilar artery tortuous but widely patent to its distal aspect. Superior cerebral arteries patent bilaterally. Hypoplastic right P1 segment with prominent right posterior communicating artery. Fetal type origin of the left PCA supplied via the left posterior communicating artery. PCAs patent to their distal aspects without flow-limiting stenosis. Venous sinuses: Patent. Anatomic variants: Fetal type origin of left PCA. No aneurysm or vascular malformation. Delayed phase: Not performed. Review of the MIP images confirms the above findings IMPRESSION: 1. Negative CTA for emergent large vessel occlusion. 2. Moderate  atherosclerotic disease involving the major arterial vasculature of the head and neck as above, most evident about the aortic arch and origin of the great vessels. No high-grade or flow-limiting stenosis identified. 3. Stable appearance of the brain. No acute intracranial hemorrhage status post tPA administration. Electronically Signed   By: Jeannine Boga M.D.   On: 08/09/2017 22:56   Mr Brain Wo Contrast  Result Date: 08/10/2017 CLINICAL DATA:  Initial evaluation for acute dizziness, status post tPA. The EXAM: MRI HEAD WITHOUT CONTRAST TECHNIQUE: Multiplanar, multiecho pulse sequences of the brain and surrounding structures were obtained without intravenous contrast. COMPARISON:  Comparison made with prior CTA from 08/09/2017. FINDINGS: Brain: Generalized age-related cerebral atrophy. Encephalomalacia with gliosis within the left parietal lobe consistent with remote left posterior MCA territory infarct. Small remote left cerebellar infarct noted as well. Mild for age chronic microvascular ischemic disease. Patchy small volume restricted diffusion within the superior left cerebellar hemisphere, consistent with acute ischemic infarct, left superior cerebellar artery territory (series 3, image 14). Involvement of the cerebellar vermis. No associated mass effect or hemorrhage. No other evidence for acute ischemia. Single punctate chronic microhemorrhage noted within the right occipital lobe, of doubtful significance in isolation. Minimal chronic blood products noted within the right frontal lobe. Chronic hemosiderin staining present with the remote left parietal infarct. No evidence for acute intracranial hemorrhage. No mass lesion, midline shift or mass effect. No hydrocephalus. No extra-axial fluid collection. Major dural sinuses are patent. Pituitary gland suprasellar region within normal limits. Midline structures intact and normal. Vascular: Major intracranial vascular flow voids are maintained. Skull  and upper cervical spine: Craniocervical junction within normal limits. Visualized upper cervical spine unremarkable. Bone marrow signal intensity within normal limits. No scalp soft tissue abnormality. Sinuses/Orbits: Globes and orbital soft tissues within normal limits. Paranasal sinuses are clear. Trace left mastoid effusion. Inner ear structures normal. Other: None. IMPRESSION: 1. Patchy small volume acute ischemic nonhemorrhagic superior left cerebellar infarct, left SCA territory. 2. Remote left parietal and left cerebellar infarcts. 3. Mild chronic microvascular ischemic disease. Electronically Signed   By: Jeannine Boga M.D.   On: 08/10/2017 16:19   Ct Head Code Stroke Wo Contrast`  Result Date: 08/09/2017 CLINICAL DATA:  Code stroke.  Slurred speech.  Left-sided weakness. EXAM: CT HEAD WITHOUT CONTRAST TECHNIQUE: Contiguous axial images were obtained from the base of the skull through the vertex without intravenous contrast. COMPARISON:  Brain MRI 01/24/2016 FINDINGS: Brain: There is a small, well-defined cortical infarct posteriorly in the left parietal lobe which is new but has a subacute appearance. No right-sided cerebral infarct is identified. There is no evidence of intracranial hemorrhage, mass, midline shift, or extra-axial fluid collection. Mild cerebral atrophy is within normal limits for age. A small chronic left cerebellar infarct is unchanged. Vascular: Calcified atherosclerosis at the skullbase. No hyperdense vessel. Skull: No fracture or focal osseous lesion. Sinuses/Orbits: Visualized paranasal sinuses and mastoid air cells are clear. Visualized orbits are unremarkable. Other: None. ASPECTS [facility] Stroke Program Early CT Score) - Ganglionic level infarction (caudate, lentiform nuclei, internal capsule, insula, M1-M3 cortex): 7 - Supraganglionic infarction (M4-M6 cortex): 3 Total score (0-10 with 10 being normal): 10 IMPRESSION: 1. No intracranial hemorrhage or definite acute  infarct identified. 2. New left parietal infarct, likely subacute. 3. Small chronic left cerebellar infarct. 4. ASPECTS is 10. These results were called by telephone at the time of interpretation on 08/09/2017 at 7:46 pm to Dr. Nance Pear , who verbally acknowledged these results. Electronically Signed   By: Logan Bores M.D.   On: 08/09/2017 19:49    Physical Exam:    Pleasant elderly Caucasian lady currently not in distress. . Afebrile. Head is nontraumatic. Neck is supple without bruit.    Cardiac exam no murmur or gallop. Lungs are clear to auscultation. Distal pulses are well felt. Neurological Exam ;  Awake  Alert oriented x 3. Normal speech and language.eye movements full without nystagmus.fundi  were not visualized. Vision acuity and fields appear normal. Hearing is normal. Palatal movements are normal. Face symmetric. Tongue midline. Normal strength, tone, reflexes and coordination. Normal sensation. Mild truncal ataxia and leans to the right side. Gait deferred.  ASSESSMENT Ms. Melody Ochoa is a 76 y.o. female with a  embolic left superior cerebellar infarct treated with IV TPA with significant clinical improvement. MRI also shows silent prior left cerebellar and left parietal cortical infarcts. No significant known vascular risk factors except mild hyperlipidemia  .  Hospital day # 0  TREATMENT/PLAN  Patient will be monitored closely in the neurological intensive care unit as per post TPA protocol. Strict blood pressure control and close neurological monitoring. Check echocardiogram, lipid profile and hemoglobin A1c. Physical occupational therapy and rehabilitation consults. She needs transesophageal echocardiogram and prolonged cardiac monitoring for paroxysmal atrial fibrillation. Long discussion with the patient at the bedside and answered questions. This patient is critically ill and at significant risk of neurological worsening, death and care requires constant monitoring of  vital signs, hemodynamics,respiratory and cardiac monitoring, extensive review of multiple databases, frequent neurological assessment, discussion with family, other specialists and medical decision making of high complexity.I have made any additions or clarifications directly to the above note.This critical care time does not reflect procedure time, or teaching time or supervisory time of PA/NP/Med Resident etc but could involve care discussion time.  I spent 30 minutes of neurocritical care time  in the care of  this patient.      Antony Contras, MD [facility] Pager: [phone] 08/10/2017 5:53 PM

## 2017-08-10 NOTE — Progress Notes (Signed)
OT Cancellation Note  Patient Details Name: Melody Ochoa MRN: [id number] DOB: 1941/09/19   Cancelled Treatment:    Reason Eval/Treat Not Completed: Patient not medically ready  Parke Poisson B  [phone] 08/10/2017, 7:29 AM

## 2017-08-10 NOTE — Progress Notes (Signed)
Rehab Admissions Coordinator Note:  Patient was screened by Cleatrice Burke for appropriateness for an Inpatient Acute Rehab Consult.  At this time, we are recommending Inpatient Rehab consult.  Cleatrice Burke 08/10/2017, 7:24 PM  I can be reached at [phone].

## 2017-08-10 NOTE — Evaluation (Signed)
Physical Therapy Evaluation Patient Details Name: Melody Ochoa MRN: [id number] DOB: 12/26/1940 Today's Date: 08/10/2017   History of Present Illness  76 year old female with acute onset vertigo and left-sided weakness/ataxia, Patchy small volume acute ischemic nonhemorrhagic superior left cerebellar infarcts seen on MRI.   Clinical Impression  Orders received for PT evaluation. Patient demonstrates deficits in functional mobility as indicated below. Will benefit from continued skilled PT to address deficits and maximize function. Will see as indicated and progress as tolerated.  Patient initially able to perform basic transfer and simple ambulation with min assist for stability, however, upon having patient perform very modest head turn during session patient became significantly ataxic and unable to stabilize gait requiring 2 person physical max assist to maintain upright and coordinate steps to sit in chair. Noted elevated HR 150s briefly during gait but rebounded to 90s at rest.   Given current deficits, significant ataxia with normal functional tasks (head turns) and patients prior level of function, feel she would benefit significantly from intense comprehensive therapies to improve gait and coordination prior to d/c home. Recommend CIR consult.     Follow Up Recommendations CIR;Supervision for mobility/OOB    Equipment Recommendations   (TBD)    Recommendations for Other Services Rehab consult     Precautions / Restrictions Precautions Precautions: Fall Restrictions Weight Bearing Restrictions: No      Mobility  Bed Mobility Overal bed mobility: Needs Assistance Bed Mobility: Supine to Sit     Supine to sit: Min assist     General bed mobility comments: min assist for safety and stability upon reaching EOB  Transfers Overall transfer level: Needs assistance Equipment used: None Transfers: Sit to/from Stand Sit to Stand: Min assist         General transfer  comment: Min assist for stability  Ambulation/Gait Ambulation/Gait assistance: +2 physical assistance;Min assist;Max assist ( min assist, then required max assist (+2) after head turns) Ambulation Distance (Feet): 95 Feet Assistive device: 1 person hand held assist Gait Pattern/deviations: Ataxic;Decreased stride length;Step-through pattern;Staggering left;Narrow base of support Gait velocity: decreased   General Gait Details: patient initially min assist for pacing and stability for straight non-complicated level ambulation, patient then asked to perform head turn at which time patient with significant ataxia and inability to correct heavy left lateral staggering lean requiring increase to max assist and 2 persons to safely maintain upright and progress steps toward chair.   Stairs            Wheelchair Mobility    Modified Rankin (Stroke Patients Only) Modified Rankin (Stroke Patients Only) Pre-Morbid Rankin Score: No symptoms Modified Rankin: Moderately severe disability     Balance Overall balance assessment: Needs assistance   Sitting balance-Leahy Scale: Fair Sitting balance - Comments: able to sit EOB without over instability or lean   Standing balance support: During functional activity Standing balance-Leahy Scale: Poor Standing balance comment: reliance on physical assist for stability             High level balance activites: Head turns High Level Balance Comments: Max assist 2 person required to prevent fall, unable to compensate or correct             Pertinent Vitals/Pain Pain Assessment: No/denies pain    Home Living Family/patient expects to be discharged to:: Private residence Living Arrangements: Spouse/significant other Available Help at Discharge: Family Type of Home: House Home Access: Stairs to enter Entrance Stairs-Rails: Can reach both Entrance Stairs-Number of Steps: 2 Home Layout: One  level Home Equipment: Cane - single  point;Walker - 2 wheels;Toilet riser      Prior Function Level of Independence: Independent         Comments: States she was driving     Hand Dominance   Dominant Hand: Right    Extremity/Trunk Assessment   Upper Extremity Assessment Upper Extremity Assessment: LUE deficits/detail LUE Deficits / Details: AROM and strength WFL. Incoordination noted LUE Coordination: decreased fine motor    Lower Extremity Assessment Lower Extremity Assessment: LLE deficits/detail LLE Coordination: decreased fine motor;decreased gross motor    Cervical / Trunk Assessment Cervical / Trunk Assessment: Other exceptions Cervical / Trunk Exceptions: L bias  Communication   Communication:  (mild dysarthria)  Cognition Arousal/Alertness: Awake/alert Behavior During Therapy: Anxious Overall Cognitive Status:  (will further assess)                                        General Comments      Exercises     Assessment/Plan    PT Assessment Patient needs continued PT services  PT Problem List Decreased activity tolerance;Decreased balance;Decreased mobility;Decreased coordination;Decreased safety awareness       PT Treatment Interventions DME instruction;Gait training;Stair training;Functional mobility training;Therapeutic activities;Therapeutic exercise;Balance training;Neuromuscular re-education;Patient/family education    PT Goals (Current goals can be found in the Care Plan section)  Acute Rehab PT Goals Patient Stated Goal: to go home to her husband and her dog PT Goal Formulation: With patient Time For Goal Achievement: 08/24/17 Potential to Achieve Goals: Good    Frequency Min 4X/week   Barriers to discharge        Co-evaluation               AM-PAC PT "6 Clicks" Daily Activity  Outcome Measure Difficulty turning over in bed (including adjusting bedclothes, sheets and blankets)?: A Little Difficulty moving from lying on back to sitting on the  side of the bed? : A Little Difficulty sitting down on and standing up from a chair with arms (e.g., wheelchair, bedside commode, etc,.)?: Unable Help needed moving to and from a bed to chair (including a wheelchair)?: A Little Help needed walking in hospital room?: A Lot Help needed climbing 3-5 steps with a railing? : A Lot 6 Click Score: 14    End of Session Equipment Utilized During Treatment: Gait belt Activity Tolerance: Patient tolerated treatment well;Treatment limited secondary to medical complications (Comment) (significant oset of instability, tachycardic response 150s) Patient left: in chair;with call bell/phone within reach;with family/visitor present Nurse Communication: Mobility status PT Visit Diagnosis: Ataxic gait (R26.0);Other symptoms and signs involving the nervous system (R29.898)    Time: 8466-5993 PT Time Calculation (min) (ACUTE ONLY): 23 min   Charges:   PT Evaluation $PT Eval Moderate Complexity: 1 Mod     PT G Codes:        Alben Deeds, PT DPT  Board Certified Neurologic Specialist [place] 08/10/2017, 5:48 PM

## 2017-08-10 NOTE — H&P (Signed)
Neurology H&P  CC: Dizziness  History is obtained from: Patient  HPI: Melody Ochoa is a 76 y.o. female with a  Who presents with acute onset dizziness started around 6:15 to 6:30 PM. She presented [facility] where she was evaluated by telling neurology and felt to be having a small cerebellar or brainstem stroke. She was treated with IV TPA and transferred here. She has had marked improvement in the interim  LKW: 6:30 PM tpa given?: Yes NIHSS: 1  ROS: A 14 point ROS was performed and is negative except as noted in the HPI.   Past Medical History:  Diagnosis Date  . Arthritis   . Back pain   . Cancer ([place])    skin  . Depression   . Diverticulosis   . Dizziness    patient had episode of dizziness when came in room. hx past no dx  . GERD (gastroesophageal reflux disease)   . Heart murmur   . PONV (postoperative nausea and vomiting)    yrs ago     No family history on file.   Social History:  reports that she quit smoking about 20 years ago. Her smoking use included Cigarettes. She has a 40.00 pack-year smoking history. She has never used smokeless tobacco. She reports that she drinks alcohol. She reports that she does not use drugs.   Exam: Current vital signs: BP 117/62   Pulse 80   Temp 98.1 F (36.7 C) (Oral)   Resp 13   Ht 5\' 4"  (1.626 m)   Wt 88 kg (194 lb 0.1 oz)   SpO2 99%   BMI 33.30 kg/m  Vital signs in last 24 hours: Temp:  [97.7 F (36.5 C)-98.1 F (36.7 C)] 98.1 F (36.7 C) (09/05 0010) Pulse Rate:  [68-86] 80 (09/05 0115) Resp:  [6-19] 13 (09/05 0115) BP: (117-212)/(62-111) 117/62 (09/05 0115) SpO2:  [92 %-100 %] 99 % (09/05 0115) Weight:  [83.9 kg (185 lb)-88 kg (194 lb 0.1 oz)] 88 kg (194 lb 0.1 oz) (09/05 0000)  Physical Exam  Constitutional: Appears well-developed and well-nourished.  Psych: Affect appropriate to situation Eyes: No scleral injection HENT: No OP obstrucion Head: Normocephalic.  Cardiovascular: Normal rate and  regular rhythm.  Respiratory: Effort normal and breath sounds normal to anterior ascultation GI: Soft.  No distension. There is no tenderness.  Skin: WDI  Neuro: Mental Status: Patient is awake, alert, oriented to person, place, month, year, and situation. Patient is able to give a clear and coherent history. No signs of aphasia or neglect Cranial Nerves: II: Visual Fields are full. Pupils are equal, round, and reactive to light.   III,IV, VI: EOMI without ptosis or diploplia.  V: Facial sensation is symmetric to temperature VII: Facial movement is symmetric.  VIII: hearing is intact to voice X: Uvula elevates symmetrically XI: Shoulder shrug is symmetric. XII: tongue is midline without atrophy or fasciculations.  Motor: Tone is normal. Bulk is normal. 5/5 strength was present in all four extremities.  Sensory: Sensation is symmetric to light touch and temperature in the arms and legs. Cerebellar: She has mild difficulty with finger-nose-finger on the left arm, otherwise intact.  I have reviewed labs in epic and the results pertinent to this consultation are: CMP-unremarkable CBC-unremarkable  I have reviewed the images obtained: CTA-no large vessel occlusion  Impression: 76 year old female with acute onset vertigo and left-sided weakness/ataxia who has had a very good improvement following IV TPA.  Of note, she cannot tolerate NSAIDs and therefore  will need Plavix for secondary stroke prevention after TPA window.  Recommendations: 1. HgbA1c, fasting lipid panel 2. MRI  of the brain without contrast 3. Frequent neuro checks 4. Echocardiogram 5. Prophylactic therapy-none for 24 hours 6. Risk factor modification 7. Telemetry monitoring 8. PT consult, OT consult, Speech consult 9 continue home Cymbalta and Wellbutrin 10. please page stroke NP  Or  PA  Or MD  from 8am -4 pm as this patient will be followed by the stroke team at this point.   You can look them up on  [email or web address]      Roland Rack, MD [facility] [phone]  If 7pm- 7am, please page neurology on call as listed in [place]. 08/10/2017  1:28 AM

## 2017-08-11 ENCOUNTER — Inpatient Hospital Stay (HOSPITAL_COMMUNITY): Payer: Medicare Other

## 2017-08-11 DIAGNOSIS — I639 Cerebral infarction, unspecified: Secondary | ICD-10-CM

## 2017-08-11 DIAGNOSIS — R270 Ataxia, unspecified: Secondary | ICD-10-CM

## 2017-08-11 LAB — ECHOCARDIOGRAM COMPLETE
Ao-asc: 27 cm
E decel time: 229 msec
E/e' ratio: 13.44
FS: 21 % — AB (ref 28–44)
Height: 64 in
IVS/LV PW RATIO, ED: 0.84
LA ID, A-P, ES: 44 mm
LA diam end sys: 44 mm
LA diam index: 2.17 cm/m2
LA vol A4C: 77.2 ml
LA vol index: 36.2 mL/m2
LA vol: 73.5 mL
LV E/e' medial: 13.44
LV E/e'average: 13.44
LV PW d: 12.2 mm — AB (ref 0.6–1.1)
LV e' LATERAL: 7.27 cm/s
LVOT area: 2.84 cm2
LVOT diameter: 19 mm
Lateral S' vel: 10.1 cm/s
MV Dec: 229
MV Peak grad: 4 mmHg
MV pk A vel: 116 m/s
MV pk E vel: 97.7 m/s
PV Reg grad dias: 6 mmHg
PV Reg vel dias: 122 cm/s
TAPSE: 24.4 mm
TDI e' lateral: 7.27
TDI e' medial: 6.48
Weight: 3104.08 oz

## 2017-08-11 MED ORDER — ATORVASTATIN CALCIUM 40 MG PO TABS
40.0000 mg | ORAL_TABLET | Freq: Every day | ORAL | Status: DC
  Administered 2017-08-11 – 2017-08-12 (×2): 40 mg via ORAL
  Filled 2017-08-11 (×2): qty 1

## 2017-08-11 NOTE — Progress Notes (Signed)
  Echocardiogram 2D Echocardiogram has been performed.  Randa Lynn Luster Hechler 08/11/2017, 3:49 PM

## 2017-08-11 NOTE — Progress Notes (Signed)
  Speech Language Pathology Treatment: Cognitive-Linquistic  Patient Details Name: Melody Ochoa MRN: [id number] DOB: 1941/06/21 Today's Date: 08/11/2017 Time: 2671-2458 SLP Time Calculation (min) (ACUTE ONLY): 11 min  Assessment / Plan / Recommendation Clinical Impression  Pt was seen for skilled ST targeting speech goals. Pt's speech was much clearer today in comparison to yesterday's initial evaluation.  Pt reports feeling pain meds were impacting intelligibility during yesterday's session.  SLP provided skilled education regarding compensatory intelligibility strategies should pt notice a decline in intelligibility over the course of the day in the setting of increased fatigue.  Pt verbalized understanding of strategies and all questions were answered to her satisfaction at this time.  Recommend discharging from [facility].    HPI HPI: 76 y.o.femalewho presents with acute onset dizziness started around 6:15 to 6:30 PM. She presented [facility] where she was evaluated by neurology and felt to be having a small cerebellar or brainstem stroke. She was treated with IV TPA and transferred to [facility]. She has had markedimprovement in the interim      SLP Plan  Discharge SLP treatment due to (comment) (pt at baseline for speech)       Recommendations                   Follow up Recommendations: None SLP Visit Diagnosis: Dysarthria and anarthria (R47.1) Plan: Discharge SLP treatment due to (comment) (pt at baseline for speech)       GO                Brailon Don, Selinda Orion 08/11/2017, 11:59 AM

## 2017-08-11 NOTE — Progress Notes (Signed)
I met with pt and her spouse at bedside to discuss a possible int rehab admit. They prefer CIR rather than SNF. Noted TEE for tomorrow. I will follow up tomorrow to clarify if bed will be available for admission once medical workup is complete. [phone]

## 2017-08-11 NOTE — Progress Notes (Addendum)
Patient arrived from 4N around 2330 alert and oriented post tPa, follow up imaging already done per report from ICU as well as stated by ICU nurse patient on q4  neuro cheeks and vital signs. SCD's and Telly applied to patient.

## 2017-08-11 NOTE — Progress Notes (Signed)
Patient is reporting Headache, She is given PRN med as ordered will continue to monitor.

## 2017-08-11 NOTE — Progress Notes (Signed)
It appears that Patient's orders are still on TPA assessment. Neuro NP notified to review orders. Will continue to monitor.

## 2017-08-11 NOTE — Progress Notes (Signed)
STROKE TEAM PROGRESS NOTE   HISTORY OF PRESENT ILLNESS (per record) Melody Ochoa a 76 y.o.femalewith a history of dizziness and heart murmur who presented to [facility] on 08/09/17  with acute onset dizziness started around 6:15 to 6:30 PM. She was evaluated by teleneurology and felt to be having a small cerebellar or brainstem stroke. She was treated with IV TPA and transferred to [facility] She has had markedimprovement in the interim. LKW: 6:30 PM 08/09/17 tpa given?: Yes at [facility] NIHSS: 1  Patient was administered IV t-PA at [facility] on 08/09/2017. She was admitted to the neuro ICU for further evaluation and treatment, and was transferred to the General Neurology unit 08/10/2017.  SUBJECTIVE (INTERVAL HISTORY) This morning she is sitting at the bedside  She feels her dizziness and balance are improved.  OBJECTIVE Temp:  [98.5 F (36.9 C)-98.9 F (37.2 C)] 98.5 F (36.9 C) (09/06 1409) Pulse Rate:  [70-91] 78 (09/06 1409) Cardiac Rhythm: Normal sinus rhythm (09/06 0747) Resp:  [13-16] 16 (09/06 1409) BP: (143-165)/(48-79) 143/78 (09/06 1409) SpO2:  [96 %-99 %] 97 % (09/06 1409)  CBC:   Recent Labs Lab 08/09/17 1938  WBC 7.4  NEUTROABS 4.9  HGB 12.1  HCT 35.3  MCV 85.7  PLT 956    Basic Metabolic Panel:   Recent Labs Lab 08/09/17 1938  NA 137  K 3.9  CL 101  CO2 25  GLUCOSE 150*  BUN 19  CREATININE 1.06*  CALCIUM 9.5    Lipid Panel:     Component Value Date/Time   CHOL 199 08/10/2017 0230   TRIG 39 08/10/2017 0230   HDL 65 08/10/2017 0230   CHOLHDL 3.1 08/10/2017 0230   VLDL 8 08/10/2017 0230   LDLCALC 126 (H) 08/10/2017 0230   HgbA1c:  Lab Results  Component Value Date   HGBA1C 5.3 08/10/2017   Urine Drug Screen:     Component Value Date/Time   LABOPIA NONE DETECTED 08/09/2017 2236   COCAINSCRNUR NONE DETECTED 08/09/2017 2236   LABBENZ NONE DETECTED 08/09/2017 2236   AMPHETMU NONE DETECTED 08/09/2017 2236   THCU NONE DETECTED 08/09/2017 2236   LABBARB NONE  DETECTED 08/09/2017 2236    Alcohol Level     Component Value Date/Time   ETH <5 08/09/2017 1938    IMAGING  Ct Angio Head W Or Wo Contrast Ct Angio Neck W And/or Wo Contrast 08/09/2017 IMPRESSION: 1. Negative CTA for emergent large vessel occlusion. 2. Moderate atherosclerotic disease involving the major arterial vasculature of the head and neck as above, most evident about the aortic arch and origin of the great vessels. No high-grade or flow-limiting stenosis identified. 3. Stable appearance of the brain. No acute intracranial hemorrhage status post tPA administration.  Mr Brain Wo Contrast 08/10/2017 IMPRESSION: 1. Patchy small volume acute ischemic nonhemorrhagic superior left cerebellar infarct, left SCA territory. 2. Remote left parietal and left cerebellar infarcts. 3. Mild chronic microvascular ischemic disease.   Ct Head Code Stroke Wo Contrast 08/09/2017 IMPRESSION: 1. No intracranial hemorrhage or definite acute infarct identified. 2. New left parietal infarct, likely subacute. 3. Small chronic left cerebellar infarct. 4. ASPECTS is 10.  TTE 08/11/2017 Left ventricle: The cavity size was normal. Wall thickness was   increased in a pattern of mild LVH. Systolic function was normal.   The estimated ejection fraction was in the range of 50% to 55%.   Wall motion was normal; there were no regional wall motion    abnormalities   TEE 08/11/2017  pending (  held)    PHYSICAL EXAM  Pleasant elderly Caucasian lady currently not in distress. . Afebrile. Head is nontraumatic. Neck is supple without bruit.    Cardiac exam no murmur or gallop. Lungs are clear to auscultation. Distal pulses are well felt. Neurological Exam ;  Awake  Alert oriented x 3. Normal speech and language.eye movements full without nystagmus.fundi were not visualized. Vision acuity and fields appear normal. Hearing is normal. Palatal movements are normal. Face symmetric. Tongue midline. Normal strength, tone,  reflexes and coordination. Normal sensation. Mild truncal ataxia and leans to the right side. Gait deferred.  ASSESSMENT/PLAN Melody Ochoa is a 76 y.o. female with history ofhistory of dizziness and heart murmur who presented to [facility] on 08/09/17  with acute onset dizziness started around 6:15 to 6:30 PM. She was evaluated by teleneurology and felt to be having a small cerebellar or brainstem stroke. She was treated with IV TPA and transferred to [facility] She has had markedimprovement in the interim.   Stroke: Patchy small volume acute ischemic nonhemorrhagic superior left cerebellar infarct, left SCA territory, of undetermined etiology, in the setting of diffuse moderate aortic and carotid atherosclerosis.  Resultant  Gait imbalance  CT head:  New left parietal infarct, likely subacute. Small chronic left cerebellar infarct.   MRI head: Patchy small volume acute ischemic nonhemorrhagic superior left cerebellar infarct, left SCA territory  MRA head: not performed  CTA head/neck: No LVO or high-grade stenosis.  Diffuse moderate aortic and carotid atherosclerosis  2D Echo:Left ventricle: The cavity size was normal. Wall thickness was   increased in a pattern of mild LVH. Systolic function was normal.   The estimated ejection fraction was in the range of 50% to 55%.   Wall motion was normal; there were no regional wall motion    abnormalities   LDL: 126  HgbA1c 5.3  SCDs for VTE prophylaxis Diet regular Room service appropriate? Yes; Fluid consistency: Thin Diet NPO time specified Diet clear liquid Room service appropriate? Yes; Fluid consistency: Thin  No antithrombotic prior to admission, now on clopidogrel 75 mg daily  Patient counseled to be compliant with her antithrombotic medications  Ongoing aggressive stroke risk factor management  Therapy recommendations: CIR; Supervision/Assistance - 24 hour   Disposition: pending  Hypertension  Stable Permissive hypertension  (OK if < 220/120) but gradually normalize in 5-7 days Long-term BP goal normotensive  Hyperlipidemia  Home meds: none  LDL 126, goal < 70  Add Atorvastatin 40 mg PO daily   Continue statin at discharge  Other Stroke Risk Factors  Advanced age  ETOH use, advised to drink no more than 1 drink(s) a day  Obesity, Body mass index is 33.3 kg/m., recommend weight loss, diet and exercise as appropriate   Other Active Problems  None  Hospital day # 1  I have personally examined this patient, reviewed notes, independently viewed imaging studies, participated in medical decision making and plan of care.ROS completed by me personally and pertinent positives fully documented  I have made any additions or clarifications directly to the above note. She presented with embolic left superior cerebellar infarct treated with IV TPA with significant clinical improvement. MRI also shows silent prior left cerebellar and left parietal cortical infarcts. Recommend TEE and rehab consults. Mobilize as tolerated.Greater than 50% time during this 25 minute visit was spent on counselling and coordination of care about his embolic stroke and answering questions.  Antony Contras, MD Medical Director [place] Pager: [phone] 08/11/2017 8:48  PM   To contact Stroke Continuity provider, please refer to [email or web address] After hours, contact General Neurology

## 2017-08-11 NOTE — Progress Notes (Signed)
    [facility] has been requested to perform a transesophageal echocardiogram on 09/07 for CVA.  After careful review of history and examination, the risks and benefits of transesophageal echocardiogram have been explained including risks of esophageal damage, perforation (1:10,000 risk), bleeding, pharyngeal hematoma as well as other potential complications associated with conscious sedation including aspiration, arrhythmia, respiratory failure and death. Alternatives to treatment were discussed, questions were answered. Patient is willing to proceed.   Rosaria Ferries, Hershal Coria 08/11/2017 3:30 PM

## 2017-08-11 NOTE — Consult Note (Signed)
Physical Medicine and Rehabilitation Consult Reason for Consult: Vertigo with left-sided weakness and ataxia Referring Physician: Dr. Leonie Man   HPI: Melody Ochoa is a 76 y.o. right handed female with history of chronic back pain. Per chart review patient lives with spouse. Used occasional cane prior to admission. One level home two steps to entry. Patient still drives. Presented 08/10/2017 to [facility] with acute onset of dizziness and left-sided weakness. CT of the head showed new left parietal infarct as well as small chronic left cerebellar infarct. Patient did receive TPA. CT angiogram head and neck showed no emergent large vessel occlusion. MRI patchy small volume acute ischemic nonhemorrhagic superior left cerebellar infarct, left SCA territory. Echocardiogram is pending. Neurology consulted with workup ongoing with plans for TEE. Currently on Plavix for CVA prophylaxis. Physical therapy evaluation completed 08/10/2017 with recommendations of physical medicine rehabilitation consult.   Review of Systems  Constitutional: Negative for chills and fever.  HENT: Negative for hearing loss.   Eyes: Negative for blurred vision and double vision.  Respiratory: Negative for cough and shortness of breath.   Cardiovascular: Positive for leg swelling. Negative for chest pain and palpitations.  Gastrointestinal: Positive for constipation. Negative for nausea and vomiting.       GERD  Genitourinary: Negative for dysuria and hematuria.  Musculoskeletal: Positive for back pain, joint pain and myalgias.  Skin: Negative for rash.  Neurological: Positive for dizziness and weakness. Negative for seizures.  Psychiatric/Behavioral: Positive for depression.  All other systems reviewed and are negative.  Past Medical History:  Diagnosis Date  . Arthritis   . Back pain   . Cancer ([place])    skin  . Depression   . Diverticulosis   . Dizziness    patient had episode of  dizziness when came in room. hx past no dx  . GERD (gastroesophageal reflux disease)   . Heart murmur   . PONV (postoperative nausea and vomiting)    yrs ago   Past Surgical History:  Procedure Laterality Date  . BACK SURGERY  [place] SURGERY  09  . COLONOSCOPY WITH PROPOFOL N/A 01/13/2016   Procedure: COLONOSCOPY WITH PROPOFOL;  Surgeon: [name], MD;  Location: [facility] ENDOSCOPY;  Service: Endoscopy;  Laterality: N/A;  . FACELIFT  94  . TONSILLECTOMY    . TOTAL HIP ARTHROPLASTY Right 04/22/2016   Procedure: TOTAL HIP ARTHROPLASTY;  Surgeon: Corky Mull, MD;  Location: [facility] ORS;  Service: Orthopedics;  Laterality: Right;   History reviewed. No pertinent family history. Social History:  reports that she quit smoking about 20 years ago. Her smoking use included Cigarettes. She has a 40.00 pack-year smoking history. She has never used smokeless tobacco. She reports that she drinks alcohol. She reports that she does not use drugs. Allergies:  Allergies  Allergen Reactions  . Aleve [Naproxen Sodium] Other (See Comments)    Gi upset  . Aspirin Other (See Comments)    Stomach pain-aggravates diverticulosis  . Celebrex [Celecoxib] Other (See Comments)    Dizziness   . Effexor [Venlafaxine] Other (See Comments)    Hot flashes   . Hydrocodone-Homatropine Diarrhea  . Ibuprofen Other (See Comments)    Gi upset   . Tape Itching and Other (See Comments)    Itchy blisters  . Vioxx [Rofecoxib] Other (See Comments)    Gi upset  . Other Rash and Other (See Comments)    bandaides  . Tramadol Other (See Comments)  hallucinations   Medications Prior to Admission  Medication Sig Dispense Refill  . buPROPion (WELLBUTRIN XL) 300 MG 24 hr tablet Take 300 mg by mouth daily.     . Calcium Carbonate-Vitamin D (CALTRATE 600+D PO) Take 1 tablet by mouth daily.    . Cyanocobalamin (VITAMIN B-12 PO) Take 1 tablet by mouth daily.    . DULoxetine (CYMBALTA) 60 MG capsule Take 120 mg by  mouth daily.     . ferrous sulfate 325 (65 FE) MG tablet Take 1 tablet (325 mg total) by mouth 3 (three) times daily with meals. (Patient taking differently: Take 325 mg by mouth daily. ) 90 tablet 1  . gabapentin (NEURONTIN) 300 MG capsule Take 300 mg by mouth at bedtime.   3  . pantoprazole (PROTONIX) 40 MG tablet Take 40 mg by mouth daily.  3  . vitamin C (ASCORBIC ACID) 500 MG tablet Take 500 mg by mouth daily.      Home: Home Living Family/patient expects to be discharged to:: Private residence Living Arrangements: Spouse/significant other Available Help at Discharge: Family Type of Home: House Home Access: Stairs to enter Technical brewer of Steps: 2 Entrance Stairs-Rails: Can reach both [place]: One level Bathroom Shower/Tub: Tub only, Multimedia programmer: Handicapped height Bathroom Accessibility: Yes Home Equipment: [employer] - single point, Environmental consultant - 2 wheels, Toilet riser  Functional History: Prior Function Level of Independence: Independent Comments: States she was driving Functional Status:  Mobility: Bed Mobility Overal bed mobility: Needs Assistance Bed Mobility: Supine to Sit Supine to sit: Min assist General bed mobility comments: min assist for safety and stability upon reaching EOB Transfers Overall transfer level: Needs assistance Equipment used: None Transfers: Sit to/from Stand Sit to Stand: Min assist General transfer comment: Min assist for stability Ambulation/Gait Ambulation/Gait assistance: +2 physical assistance, Min assist, Max assist ( min assist, then required max assist (+2) after head turns) Ambulation Distance (Feet): 95 Feet Assistive device: 1 person hand held assist Gait Pattern/deviations: Ataxic, Decreased stride length, Step-through pattern, Staggering left, Narrow base of support General Gait Details: patient initially min assist for pacing and stability for straight non-complicated level ambulation, patient then asked  to perform head turn at which time patient with significant ataxia and inability to correct heavy left lateral staggering lean requiring increase to max assist and 2 persons to safely maintain upright and progress steps toward chair.  Gait velocity: decreased    ADL: ADL Overall ADL's : Needs assistance/impaired Grooming: Set up, Sitting Upper Body Bathing: Set up, Sitting Lower Body Bathing: Minimal assistance, Sit to/from stand Upper Body Dressing : Minimal assistance, Sitting Lower Body Dressing: Moderate assistance, Sit to/from stand Toilet Transfer: Moderate assistance Toilet Transfer Details (indicate cue type and reason): L bias. increased difficulty with head turns Toileting- Clothing Manipulation and Hygiene: Minimal assistance, Sit to/from stand Functional mobility during ADLs: Moderate assistance General ADL Comments: Pt initially mobilizing well until she began to move her head, then requried mod A due to poor midline postural orientation with L bias  Cognition: Cognition Overall Cognitive Status:  (will further assess) Orientation Level: Oriented X4 Cognition Arousal/Alertness: Awake/alert Behavior During Therapy: Anxious Overall Cognitive Status:  (will further assess)  Blood pressure (!) 146/67, pulse 73, temperature 98.6 F (37 C), temperature source Oral, resp. rate 16, height 5\' 4"  (1.626 m), weight 88 kg (194 lb 0.1 oz), SpO2 98 %. Physical Exam  Constitutional: She is oriented to person, place, and time. She appears well-developed.  HENT:  Head:  Normocephalic.  Eyes: EOM are normal.  Neck: Normal range of motion. Neck supple. No thyromegaly present.  Cardiovascular: Normal rate, regular rhythm and normal heart sounds.   Respiratory: Effort normal and breath sounds normal. No respiratory distress.  GI: Soft. Bowel sounds are normal. She exhibits no distension.  Neurological: She is alert and oriented to person, place, and time.  Follows full commands. Fair  awareness of deficits. Cognitively appropriate. Mild left sided weakness 4 to 4+/5. Left limb ataxia with impaired HTS, FTN. No sensory deficits  Skin: Skin is warm and dry.  Psychiatric: She has a normal mood and affect.    No results found for this or any previous visit (from the past 24 hour(s)). Ct Angio Head W Or Wo Contrast  Result Date: 08/09/2017 CLINICAL DATA:  Initial evaluation for acute dizziness, left upper extremity weakness. EXAM: CT ANGIOGRAPHY HEAD AND NECK TECHNIQUE: Multidetector CT imaging of the head and neck was performed using the standard protocol during bolus administration of intravenous contrast. Multiplanar CT image reconstructions and MIPs were obtained to evaluate the vascular anatomy. Carotid stenosis measurements (when applicable) are obtained utilizing NASCET criteria, using the distal internal carotid diameter as the denominator. CONTRAST:  75 cc of Isovue 370. COMPARISON:  Prior CT from earlier the same day. FINDINGS: CT HEAD FINDINGS Brain: Stable atrophy with chronic microvascular ischemic disease. Remote left occipital and cerebellar infarcts. No acute intracranial hemorrhage. No acute large vessel territory infarct. No mass lesion, midline shift or mass effect. No hydrocephalus. No extra-axial fluid collection. Vascular: No hyperdense vessel. Skull: Scalp soft tissues and calvarium within normal limits. Sinuses: Visualized paranasal sinuses and mastoid air cells are clear. Orbits: Visualized globes and orbital soft tissues within normal limits. Review of the MIP images confirms the above findings CTA NECK FINDINGS Aortic arch: Visualized aortic arch of normal caliber with normal branch pattern. Scattered atheromatous plaque within the arch and about the origin of the great vessels without flow-limiting stenosis. Partially visualized subclavian artery is widely patent. Right carotid system: Right common carotid artery patent from its origin to the bifurcation without  stenosis. Eccentric calcified plaque about the right carotid bifurcation without flow-limiting stenosis. Right ICA patent distally to the skullbase without stenosis, dissection, or occlusion. Left carotid system: Left common carotid artery patent from its origin to the bifurcation without stenosis. Scattered calcified plaque about the left carotid bifurcation without flow-limiting stenosis. Left ICA patent distally to the skullbase without stenosis, dissection, or occlusion. Vertebral arteries: Both of the vertebral arteries arise from the subclavian arteries. Scattered atheromatous irregularity within the mid to lower without flow-limiting stenosis. Vertebral arteries patent within the neck without dissection or occlusion. Skeleton: No acute osseous abnormality. No worrisome lytic or blastic osseous lesions. Sequelae of prior posterior decompression with fusion noted within cervical spine. Moderate multilevel degenerative spondylolysis, greatest at C4-5 through C6-7. Other neck: No acute soft tissue abnormality within the neck. Salivary glands within normal limits. Thyroid normal. No adenopathy. Upper chest: Visualized upper mediastinum within normal limits. Scattered irregular biapical pleuroparenchymal scarring with bronchiectasis noted. Associated biapical fibrotic lung changes. Review of the MIP images confirms the above findings CTA HEAD FINDINGS Anterior circulation: Petrous segments widely patent bilaterally. Scattered atheromatous plaque within the cavernous/ supraclinoid ICAs without flow-limiting stenosis. ICA termini widely patent. A1 segments patent bilaterally. Anterior communicating artery normal. Anterior cerebral arteries demonstrate scattered atheromatous irregularity but are patent to their distal aspects without flow-limiting stenosis. M1 segments patent without stenosis or occlusion. MCA bifurcations normal. No proximal M2  occlusion. Distal MCA branches well opacified and symmetric. Distal small  vessel atheromatous irregularity noted. Posterior circulation: Focal plaque noted at the dominant left vertebral artery as it crosses the dural margin with associated mild stenosis. Vertebral artery's patent to the vertebrobasilar junction without flow-limiting stenosis. Right PICA patent. Left PICA not visualized. Basilar artery tortuous but widely patent to its distal aspect. Superior cerebral arteries patent bilaterally. Hypoplastic right P1 segment with prominent right posterior communicating artery. Fetal type origin of the left PCA supplied via the left posterior communicating artery. PCAs patent to their distal aspects without flow-limiting stenosis. Venous sinuses: Patent. Anatomic variants: Fetal type origin of left PCA. No aneurysm or vascular malformation. Delayed phase: Not performed. Review of the MIP images confirms the above findings IMPRESSION: 1. Negative CTA for emergent large vessel occlusion. 2. Moderate atherosclerotic disease involving the major arterial vasculature of the head and neck as above, most evident about the aortic arch and origin of the great vessels. No high-grade or flow-limiting stenosis identified. 3. Stable appearance of the brain. No acute intracranial hemorrhage status post tPA administration. Electronically Signed   By: Jeannine Boga M.D.   On: 08/09/2017 22:56   Ct Angio Neck W And/or Wo Contrast  Result Date: 08/09/2017 CLINICAL DATA:  Initial evaluation for acute dizziness, left upper extremity weakness. EXAM: CT ANGIOGRAPHY HEAD AND NECK TECHNIQUE: Multidetector CT imaging of the head and neck was performed using the standard protocol during bolus administration of intravenous contrast. Multiplanar CT image reconstructions and MIPs were obtained to evaluate the vascular anatomy. Carotid stenosis measurements (when applicable) are obtained utilizing NASCET criteria, using the distal internal carotid diameter as the denominator. CONTRAST:  75 cc of Isovue 370.  COMPARISON:  Prior CT from earlier the same day. FINDINGS: CT HEAD FINDINGS Brain: Stable atrophy with chronic microvascular ischemic disease. Remote left occipital and cerebellar infarcts. No acute intracranial hemorrhage. No acute large vessel territory infarct. No mass lesion, midline shift or mass effect. No hydrocephalus. No extra-axial fluid collection. Vascular: No hyperdense vessel. Skull: Scalp soft tissues and calvarium within normal limits. Sinuses: Visualized paranasal sinuses and mastoid air cells are clear. Orbits: Visualized globes and orbital soft tissues within normal limits. Review of the MIP images confirms the above findings CTA NECK FINDINGS Aortic arch: Visualized aortic arch of normal caliber with normal branch pattern. Scattered atheromatous plaque within the arch and about the origin of the great vessels without flow-limiting stenosis. Partially visualized subclavian artery is widely patent. Right carotid system: Right common carotid artery patent from its origin to the bifurcation without stenosis. Eccentric calcified plaque about the right carotid bifurcation without flow-limiting stenosis. Right ICA patent distally to the skullbase without stenosis, dissection, or occlusion. Left carotid system: Left common carotid artery patent from its origin to the bifurcation without stenosis. Scattered calcified plaque about the left carotid bifurcation without flow-limiting stenosis. Left ICA patent distally to the skullbase without stenosis, dissection, or occlusion. Vertebral arteries: Both of the vertebral arteries arise from the subclavian arteries. Scattered atheromatous irregularity within the mid to lower without flow-limiting stenosis. Vertebral arteries patent within the neck without dissection or occlusion. Skeleton: No acute osseous abnormality. No worrisome lytic or blastic osseous lesions. Sequelae of prior posterior decompression with fusion noted within cervical spine. Moderate  multilevel degenerative spondylolysis, greatest at C4-5 through C6-7. Other neck: No acute soft tissue abnormality within the neck. Salivary glands within normal limits. Thyroid normal. No adenopathy. Upper chest: Visualized upper mediastinum within normal limits. Scattered irregular biapical pleuroparenchymal  scarring with bronchiectasis noted. Associated biapical fibrotic lung changes. Review of the MIP images confirms the above findings CTA HEAD FINDINGS Anterior circulation: Petrous segments widely patent bilaterally. Scattered atheromatous plaque within the cavernous/ supraclinoid ICAs without flow-limiting stenosis. ICA termini widely patent. A1 segments patent bilaterally. Anterior communicating artery normal. Anterior cerebral arteries demonstrate scattered atheromatous irregularity but are patent to their distal aspects without flow-limiting stenosis. M1 segments patent without stenosis or occlusion. MCA bifurcations normal. No proximal M2 occlusion. Distal MCA branches well opacified and symmetric. Distal small vessel atheromatous irregularity noted. Posterior circulation: Focal plaque noted at the dominant left vertebral artery as it crosses the dural margin with associated mild stenosis. Vertebral artery's patent to the vertebrobasilar junction without flow-limiting stenosis. Right PICA patent. Left PICA not visualized. Basilar artery tortuous but widely patent to its distal aspect. Superior cerebral arteries patent bilaterally. Hypoplastic right P1 segment with prominent right posterior communicating artery. Fetal type origin of the left PCA supplied via the left posterior communicating artery. PCAs patent to their distal aspects without flow-limiting stenosis. Venous sinuses: Patent. Anatomic variants: Fetal type origin of left PCA. No aneurysm or vascular malformation. Delayed phase: Not performed. Review of the MIP images confirms the above findings IMPRESSION: 1. Negative CTA for emergent large  vessel occlusion. 2. Moderate atherosclerotic disease involving the major arterial vasculature of the head and neck as above, most evident about the aortic arch and origin of the great vessels. No high-grade or flow-limiting stenosis identified. 3. Stable appearance of the brain. No acute intracranial hemorrhage status post tPA administration. Electronically Signed   By: Jeannine Boga M.D.   On: 08/09/2017 22:56   Mr Brain Wo Contrast  Result Date: 08/10/2017 CLINICAL DATA:  Initial evaluation for acute dizziness, status post tPA. The EXAM: MRI HEAD WITHOUT CONTRAST TECHNIQUE: Multiplanar, multiecho pulse sequences of the brain and surrounding structures were obtained without intravenous contrast. COMPARISON:  Comparison made with prior CTA from 08/09/2017. FINDINGS: Brain: Generalized age-related cerebral atrophy. Encephalomalacia with gliosis within the left parietal lobe consistent with remote left posterior MCA territory infarct. Small remote left cerebellar infarct noted as well. Mild for age chronic microvascular ischemic disease. Patchy small volume restricted diffusion within the superior left cerebellar hemisphere, consistent with acute ischemic infarct, left superior cerebellar artery territory (series 3, image 14). Involvement of the cerebellar vermis. No associated mass effect or hemorrhage. No other evidence for acute ischemia. Single punctate chronic microhemorrhage noted within the right occipital lobe, of doubtful significance in isolation. Minimal chronic blood products noted within the right frontal lobe. Chronic hemosiderin staining present with the remote left parietal infarct. No evidence for acute intracranial hemorrhage. No mass lesion, midline shift or mass effect. No hydrocephalus. No extra-axial fluid collection. Major dural sinuses are patent. Pituitary gland suprasellar region within normal limits. Midline structures intact and normal. Vascular: Major intracranial vascular flow  voids are maintained. Skull and upper cervical spine: Craniocervical junction within normal limits. Visualized upper cervical spine unremarkable. Bone marrow signal intensity within normal limits. No scalp soft tissue abnormality. Sinuses/Orbits: Globes and orbital soft tissues within normal limits. Paranasal sinuses are clear. Trace left mastoid effusion. Inner ear structures normal. Other: None. IMPRESSION: 1. Patchy small volume acute ischemic nonhemorrhagic superior left cerebellar infarct, left SCA territory. 2. Remote left parietal and left cerebellar infarcts. 3. Mild chronic microvascular ischemic disease. Electronically Signed   By: Jeannine Boga M.D.   On: 08/10/2017 16:19   Ct Head Code Stroke Wo Contrast`  Result Date: 08/09/2017 CLINICAL  DATA:  Code stroke.  Slurred speech.  Left-sided weakness. EXAM: CT HEAD WITHOUT CONTRAST TECHNIQUE: Contiguous axial images were obtained from the base of the skull through the vertex without intravenous contrast. COMPARISON:  Brain MRI 01/24/2016 FINDINGS: Brain: There is a small, well-defined cortical infarct posteriorly in the left parietal lobe which is new but has a subacute appearance. No right-sided cerebral infarct is identified. There is no evidence of intracranial hemorrhage, mass, midline shift, or extra-axial fluid collection. Mild cerebral atrophy is within normal limits for age. A small chronic left cerebellar infarct is unchanged. Vascular: Calcified atherosclerosis at the skullbase. No hyperdense vessel. Skull: No fracture or focal osseous lesion. Sinuses/Orbits: Visualized paranasal sinuses and mastoid air cells are clear. Visualized orbits are unremarkable. Other: None. ASPECTS [facility] Stroke Program Early CT Score) - Ganglionic level infarction (caudate, lentiform nuclei, internal capsule, insula, M1-M3 cortex): 7 - Supraganglionic infarction (M4-M6 cortex): 3 Total score (0-10 with 10 being normal): 10 IMPRESSION: 1. No intracranial  hemorrhage or definite acute infarct identified. 2. New left parietal infarct, likely subacute. 3. Small chronic left cerebellar infarct. 4. ASPECTS is 10. These results were called by telephone at the time of interpretation on 08/09/2017 at 7:46 pm to Dr. Nance Pear , who verbally acknowledged these results. Electronically Signed   By: Logan Bores M.D.   On: 08/09/2017 19:49    Assessment/Plan: Diagnosis: left limb ataxia, impaired balance after left cerebellar infarct (also with left parietal infarct) 1. Does the need for close, 24 hr/day medical supervision in concert with the patient's rehab needs make it unreasonable for this patient to be served in a less intensive setting? Yes 2. Co-Morbidities requiring supervision/potential complications: post-stroke sequelae 3. Due to bladder management, bowel management, safety, skin/wound care, disease management, medication administration, pain management and patient education, does the patient require 24 hr/day rehab nursing? Yes 4. Does the patient require coordinated care of a physician, rehab nurse, PT (1-2 hrs/day, 5 days/week) and OT (1-2 hrs/day, 5 days/week) to address physical and functional deficits in the context of the above medical diagnosis(es)? Yes Addressing deficits in the following areas: balance, endurance, locomotion, strength, transferring, bowel/bladder control, bathing, dressing, feeding, grooming, toileting and psychosocial support 5. Can the patient actively participate in an intensive therapy program of at least 3 hrs of therapy per day at least 5 days per week? Yes 6. The potential for patient to make measurable gains while on inpatient rehab is excellent 7. Anticipated functional outcomes upon discharge from inpatient rehab are modified independent  with PT, modified independent and supervision with OT, n/a with SLP. 8. Estimated rehab length of stay to reach the above functional goals is: 7-11 days 9. Anticipated D/C  setting: Home 10. Anticipated post D/C treatments: [place] therapy 11. Overall Rehab/Functional Prognosis: excellent  RECOMMENDATIONS: This patient's condition is appropriate for continued rehabilitative care in the following setting: CIR Patient has agreed to participate in recommended program. Yes Note that insurance prior authorization may be required for reimbursement for recommended care.  Comment: Rehab Admissions Coordinator to follow up.  Thanks,  Meredith Staggers, MD, Mellody Drown    Cathlyn Parsons., PA-C 08/11/2017

## 2017-08-11 NOTE — Progress Notes (Signed)
Physical Therapy Treatment Patient Details Name: Melody Ochoa MRN: [id number] DOB: 10-Feb-1941 Today's Date: 08/11/2017    History of Present Illness Pt is a 76 year old female with acute onset vertigo and left-sided weakness/ataxia, Patchy small volume acute ischemic nonhemorrhagic superior left cerebellar infarcts seen on MRI.     PT Comments    Pt seen for mobility progression. Pt tolerated ambulating in hallway without use of an AD with min guard for safety. Pt's HR elevated to 110 bpm during, but pt with no symptoms or issues like she experienced in the previous session. Pt remains an excellent candidate for Inpatient Rehab to further progress her to be as independent as possible prior to returning home. PT will continue to follow acutely.     Follow Up Recommendations  CIR;Supervision for mobility/OOB     Equipment Recommendations  None recommended by PT    Recommendations for Other Services       Precautions / Restrictions Precautions Precautions: Fall Restrictions Weight Bearing Restrictions: No    Mobility  Bed Mobility Overal bed mobility: Needs Assistance Bed Mobility: Sit to Supine       Sit to supine: Supervision   General bed mobility comments: supervision for safety  Transfers Overall transfer level: Needs assistance Equipment used: None Transfers: Sit to/from Stand Sit to Stand: Supervision         General transfer comment: supervision for safety  Ambulation/Gait Ambulation/Gait assistance: Min guard Ambulation Distance (Feet): 300 Feet Assistive device: None (occasionally reaching for handrail in hallways) Gait Pattern/deviations: Antalgic;Step-through pattern;Decreased stride length Gait velocity: decreased Gait velocity interpretation: Below normal speed for age/gender General Gait Details: pt with reported L hip and thigh pain (chronic) during ambulation causing her to have an antalgic gait pattern. pt's HR elevated to 110 bpm during  ambulation. pt with mild instability but no overt LOB or need for physical assistance, min guard for safety   Stairs            Wheelchair Mobility    Modified Rankin (Stroke Patients Only) Modified Rankin (Stroke Patients Only) Pre-Morbid Rankin Score: No symptoms Modified Rankin: Moderate disability     Balance Overall balance assessment: Needs assistance Sitting-balance support: Feet supported Sitting balance-Leahy Scale: Good Sitting balance - Comments: pt able to don socks sitting EOB with supervision   Standing balance support: During functional activity;No upper extremity supported Standing balance-Leahy Scale: Fair                              Cognition Arousal/Alertness: Awake/alert Behavior During Therapy: WFL for tasks assessed/performed Overall Cognitive Status: Within Functional Limits for tasks assessed                                        Exercises      General Comments        Pertinent Vitals/Pain Pain Assessment: 0-10 Pain Score: 5  Pain Location: headache Pain Descriptors / Indicators: Headache Pain Intervention(s): Monitored during session;Repositioned    Home Living     Available Help at Discharge: Family;Available 24 hours/day                Prior Function            PT Goals (current goals can now be found in the care plan section) Acute Rehab PT Goals PT Goal Formulation: With patient Time  For Goal Achievement: 08/24/17 Potential to Achieve Goals: Good Progress towards PT goals: Progressing toward goals    Frequency    Min 4X/week      PT Plan Current plan remains appropriate    Co-evaluation              AM-PAC PT "6 Clicks" Daily Activity  Outcome Measure  Difficulty turning over in bed (including adjusting bedclothes, sheets and blankets)?: None Difficulty moving from lying on back to sitting on the side of the bed? : None Difficulty sitting down on and standing up  from a chair with arms (e.g., wheelchair, bedside commode, etc,.)?: A Little Help needed moving to and from a bed to chair (including a wheelchair)?: A Little Help needed walking in hospital room?: A Little Help needed climbing 3-5 steps with a railing? : A Little 6 Click Score: 20    End of Session Equipment Utilized During Treatment: Gait belt Activity Tolerance: Patient tolerated treatment well Patient left: in bed;with call bell/phone within reach;Other (comment) (technician entering room to perform a bedside test) Nurse Communication: Mobility status PT Visit Diagnosis: Other symptoms and signs involving the nervous system (R29.898);Other abnormalities of gait and mobility (R26.89)     Time: 5638-7564 PT Time Calculation (min) (ACUTE ONLY): 17 min  Charges:  $Gait Training: 8-22 mins                    G Codes:       [place], [place], [place] 08/11/2017, 4:01 PM

## 2017-08-12 ENCOUNTER — Inpatient Hospital Stay (HOSPITAL_COMMUNITY): Payer: Medicare Other | Admitting: Certified Registered"

## 2017-08-12 ENCOUNTER — Inpatient Hospital Stay (HOSPITAL_COMMUNITY): Payer: Medicare Other

## 2017-08-12 ENCOUNTER — Encounter (HOSPITAL_COMMUNITY)
Admission: EM | Disposition: A | Discharge status: Home Health | Payer: Self-pay | Source: Other Acute Inpatient Hospital | Attending: Neurology

## 2017-08-12 ENCOUNTER — Encounter (HOSPITAL_COMMUNITY): Payer: Self-pay | Admitting: *Deleted

## 2017-08-12 DIAGNOSIS — I340 Nonrheumatic mitral (valve) insufficiency: Secondary | ICD-10-CM

## 2017-08-12 DIAGNOSIS — I638 Other cerebral infarction: Secondary | ICD-10-CM

## 2017-08-12 HISTORY — PX: TEE WITHOUT CARDIOVERSION: SHX5443

## 2017-08-12 SURGERY — ECHOCARDIOGRAM, TRANSESOPHAGEAL
Anesthesia: Monitor Anesthesia Care

## 2017-08-12 MED ORDER — ACETAMINOPHEN-CODEINE #3 300-30 MG PO TABS
2.0000 | ORAL_TABLET | Freq: Once | ORAL | Status: AC | PRN
  Administered 2017-08-12: 2 via ORAL
  Filled 2017-08-12: qty 2

## 2017-08-12 MED ORDER — FERROUS SULFATE 325 (65 FE) MG PO TABS
325.0000 mg | ORAL_TABLET | Freq: Every day | ORAL | 0 refills | Status: DC
Start: 2017-08-12 — End: 2019-08-20

## 2017-08-12 MED ORDER — CLOPIDOGREL BISULFATE 75 MG PO TABS: 75.0000 mg | ORAL_TABLET | Freq: Every day | ORAL | 0 refills | Status: DC

## 2017-08-12 MED ORDER — PROPOFOL 10 MG/ML IV BOLUS
INTRAVENOUS | Status: DC | PRN
  Administered 2017-08-12 (×3): 20 mg via INTRAVENOUS
  Administered 2017-08-12: 10 mg via INTRAVENOUS

## 2017-08-12 MED ORDER — PROPOFOL 500 MG/50ML IV EMUL
INTRAVENOUS | Status: DC | PRN
  Administered 2017-08-12: 50 ug/kg/min via INTRAVENOUS

## 2017-08-12 MED ORDER — BUTAMBEN-TETRACAINE-BENZOCAINE 2-2-14 % EX AERO
INHALATION_SPRAY | CUTANEOUS | Status: DC | PRN
Start: 2017-08-12 — End: 2017-08-12
  Administered 2017-08-12: 2 via TOPICAL

## 2017-08-12 MED ORDER — ATORVASTATIN CALCIUM 40 MG PO TABS: 40.0000 mg | ORAL_TABLET | Freq: Every day | ORAL | 0 refills | Status: DC

## 2017-08-12 MED ORDER — SODIUM CHLORIDE 0.9 % IV SOLN: INTRAVENOUS | Status: DC

## 2017-08-12 NOTE — CV Procedure (Signed)
    PROCEDURE NOTE:  Procedure:  Transesophageal echocardiogram Operator:  Fransico Him, MD Indications:  CVA Complications: None  During this procedure the patient is administered a total of Propofol 158 mg to achieve and maintain moderate conscious sedation by anesthesia.  The patient's heart rate, blood pressure, and oxygen saturation are monitored continuously during the procedure.   Results: Normal LV size and function EF 50% Normal RV size and function Normal RA Mild to moderately dilated LA Normal TV with trivial TR Normal PV with trivial PR Normal MV with mild MR Normal trileaflet AV with trivial AR Lipomatous interatrial septum.  The midportion is thin and aneurysmal bowing into the LA c/w increased LA pressure.   No evidence of shunt by colorflow dopper or agitated saline contrast injection.  Large bulky plaque in the aortic arch - recommend Chest CT angio for further evaluation or MRI.  The patient tolerated the procedure well and was transferred back to their room in stable condition.  Signed: Fransico Him, MD [facility]

## 2017-08-12 NOTE — Progress Notes (Signed)
[facility]  CONSULT SERVICE NOTE  Pt has progressed further since I saw her yesterday. Really at a supervision level now. She wants to go home, and her husband is there to assist. Has TEE today.  Recommend HH vs outpt PT/OT.   Thanks  Meredith Staggers, MD, [facility] 08/12/2017

## 2017-08-12 NOTE — Anesthesia Postprocedure Evaluation (Signed)
Anesthesia Post Note  Patient: Melody Ochoa  Procedure(s) Performed: Procedure(s) (LRB): TRANSESOPHAGEAL ECHOCARDIOGRAM (TEE) (N/A)     Patient location during evaluation: PACU Anesthesia Type: MAC Level of consciousness: awake and alert Pain management: pain level controlled Vital Signs Assessment: post-procedure vital signs reviewed and stable Respiratory status: spontaneous breathing, nonlabored ventilation, respiratory function stable and patient connected to nasal cannula oxygen Cardiovascular status: stable and blood pressure returned to baseline Anesthetic complications: no    Last Vitals:  Vitals:   08/12/17 1435 08/12/17 1436  BP:  (!) 175/75  Pulse: 71 71  Resp: 16 11  Temp:    SpO2: 98% 99%    Last Pain:  Vitals:   08/12/17 1434  TempSrc: Oral  PainSc:                  Ryan P Ellender

## 2017-08-12 NOTE — Transfer of Care (Signed)
Immediate Anesthesia Transfer of Care Note  Patient: Melody Ochoa  Procedure(s) Performed: Procedure(s): TRANSESOPHAGEAL ECHOCARDIOGRAM (TEE) (N/A)  Patient Location: Endoscopy Unit  Anesthesia Type:MAC  Level of Consciousness: awake, oriented and patient cooperative  Airway & Oxygen Therapy: Patient Spontanous Breathing and Patient connected to nasal cannula oxygen  Post-op Assessment: Report given to RN, Post -op Vital signs reviewed and stable and Patient moving all extremities  Post vital signs: Reviewed and stable  Last Vitals:  Vitals:   08/12/17 0850 08/12/17 1245  BP: (!) 164/74 (!) 201/86  Pulse: 72   Resp: 16 12  Temp: 37.1 C 36.7 C  SpO2: 97% 98%    Last Pain:  Vitals:   08/12/17 1245  TempSrc: Oral  PainSc:       Patients Stated Pain Goal: 0 (59/29/24 4628)  Complications: No apparent anesthesia complications

## 2017-08-12 NOTE — Anesthesia Preprocedure Evaluation (Addendum)
Anesthesia Evaluation  Patient identified by MRN, date of birth, ID band Patient awake    Reviewed: Allergy & Precautions, H&P , NPO status , Patient's Chart, lab work & pertinent test results  History of Anesthesia Complications (+) PONV and history of anesthetic complications  Airway Mallampati: II  TM Distance: <3 FB Neck ROM: Limited    Dental  (+) Partial Lower   Pulmonary former smoker,    Pulmonary exam normal        Cardiovascular Normal cardiovascular exam+ Valvular Problems/Murmurs      Neuro/Psych PSYCHIATRIC DISORDERS Depression CVA, No Residual Symptoms    GI/Hepatic Neg liver ROS, GERD  Medicated and Controlled,diverticulosis   Endo/Other  negative endocrine ROS  Renal/GU negative Renal ROS     Musculoskeletal  (+) Arthritis , Osteoarthritis,    Abdominal Normal abdominal exam  (+) + obese,   Peds  Hematology  (+) anemia ,   Anesthesia Other Findings   Reproductive/Obstetrics                            Anesthesia Physical  Anesthesia Plan  ASA: III  Anesthesia Plan: MAC   Post-op Pain Management:    Induction: Intravenous  PONV Risk Score and Plan: 3 and Treatment may vary due to age or medical condition  Airway Management Planned: Natural Airway  Additional Equipment:   Intra-op Plan:   Post-operative Plan:   Informed Consent: I have reviewed the patients History and Physical, chart, labs and discussed the procedure including the risks, benefits and alternatives for the proposed anesthesia with the patient or authorized representative who has indicated his/her understanding and acceptance.     Plan Discussed with: CRNA  Anesthesia Plan Comments:         Anesthesia Quick Evaluation

## 2017-08-12 NOTE — Interval H&P Note (Signed)
History and Physical Interval Note:  08/12/2017 1:47 PM  Melody Ochoa  has presented today for surgery, with the diagnosis of STROKE  The various methods of treatment have been discussed with the patient and family. After consideration of risks, benefits and other options for treatment, the patient has consented to  Procedure(s): TRANSESOPHAGEAL ECHOCARDIOGRAM (TEE) (N/A) as a surgical intervention .  The patient's history has been reviewed, patient examined, no change in status, stable for surgery.  I have reviewed the patient's chart and labs.  Questions were answered to the patient's satisfaction.     Fransico Him

## 2017-08-12 NOTE — Progress Notes (Signed)
  Echocardiogram 2D Echocardiogram has been performed.  Melody Ochoa 08/12/2017, 2:42 PM

## 2017-08-12 NOTE — Care Management Note (Signed)
Case Management Note  Patient Details  Name: Melody Ochoa MRN: [id number] Date of Birth: 1941-08-02  Subjective/Objective:   Pt admitted with CVA. She is from home with her spouse.                 Action/Plan: Recommendations are for CIR. CIR MD recommending [place] vs outpatient therapy. CM following for d/c needs, physician orders.   Expected Discharge Date:                  Expected Discharge Plan:  [place]  In-House Referral:     Discharge planning Services  CM Consult  Post Acute Care Choice:    Choice offered to:     DME Arranged:    DME Agency:     HH Arranged:    [place] Agency:     Status of Service:  In process, will continue to follow  If discussed at Long Length of Stay Meetings, dates discussed:    Additional Comments:  Pollie Friar, RN 08/12/2017, 3:24 PM

## 2017-08-12 NOTE — Progress Notes (Signed)
Physical Therapy Treatment Patient Details Name: Melody Ochoa MRN: [id number] DOB: Sep 22, 1941 Today's Date: 08/12/2017    History of Present Illness Pt is a 76 year old female with acute onset vertigo and left-sided weakness/ataxia, Patchy small volume acute ischemic nonhemorrhagic superior left cerebellar infarcts seen on MRI.     PT Comments    Pt making good progress towards achieving current functional mobility goals. Pt reported that the plan is now for her to d/c home with assistance from husband and receive HHPT. PT will continue to follow acutely for mobility progression.    Follow Up Recommendations  CIR;Supervision for mobility/OOB;Other (comment) (per pt - plan is to d/c home with HHPT)     Equipment Recommendations  None recommended by PT    Recommendations for Other Services       Precautions / Restrictions Precautions Precautions: Fall Restrictions Weight Bearing Restrictions: No    Mobility  Bed Mobility Overal bed mobility: Modified Independent                Transfers Overall transfer level: Needs assistance Equipment used: None Transfers: Sit to/from Stand Sit to Stand: Supervision         General transfer comment: supervision for safety  Ambulation/Gait Ambulation/Gait assistance: Min guard Ambulation Distance (Feet): 300 Feet Assistive device: None Gait Pattern/deviations: Antalgic;Step-through pattern;Decreased stride length Gait velocity: decreased Gait velocity interpretation: Below normal speed for age/gender General Gait Details: pt with reported L hip and thigh pain (chronic) during ambulation causing her to have an antalgic gait pattern. pt with mild instability but no overt LOB or need for physical assistance, min guard for safety   Stairs            Wheelchair Mobility    Modified Rankin (Stroke Patients Only) Modified Rankin (Stroke Patients Only) Pre-Morbid Rankin Score: No symptoms Modified Rankin: Moderate  disability     Balance Overall balance assessment: Needs assistance Sitting-balance support: Feet supported Sitting balance-Leahy Scale: Good     Standing balance support: During functional activity;No upper extremity supported Standing balance-Leahy Scale: Fair                              Cognition Arousal/Alertness: Awake/alert Behavior During Therapy: WFL for tasks assessed/performed Overall Cognitive Status: Within Functional Limits for tasks assessed                                        Exercises      General Comments        Pertinent Vitals/Pain Pain Assessment: No/denies pain    Home Living                      Prior Function            PT Goals (current goals can now be found in the care plan section) Acute Rehab PT Goals PT Goal Formulation: With patient Time For Goal Achievement: 08/24/17 Potential to Achieve Goals: Good Progress towards PT goals: Progressing toward goals    Frequency    Min 4X/week      PT Plan Current plan remains appropriate    Co-evaluation              AM-PAC PT "6 Clicks" Daily Activity  Outcome Measure  Difficulty turning over in bed (including adjusting bedclothes, sheets and blankets)?: None Difficulty moving  from lying on back to sitting on the side of the bed? : None Difficulty sitting down on and standing up from a chair with arms (e.g., wheelchair, bedside commode, etc,.)?: A Little Help needed moving to and from a bed to chair (including a wheelchair)?: A Little Help needed walking in hospital room?: A Little Help needed climbing 3-5 steps with a railing? : A Little 6 Click Score: 20    End of Session Equipment Utilized During Treatment: Gait belt Activity Tolerance: Patient tolerated treatment well Patient left: in bed;with call bell/phone within reach;with bed alarm set Nurse Communication: Mobility status PT Visit Diagnosis: Other symptoms and signs  involving the nervous system (R29.898);Other abnormalities of gait and mobility (R26.89)     Time: 0488-8916 PT Time Calculation (min) (ACUTE ONLY): 12 min  Charges:  $Gait Training: 8-22 mins                    G Codes:       [place], [place], [place] 08/12/2017, 12:29 PM

## 2017-08-12 NOTE — Progress Notes (Signed)
OT Cancellation Note    08/12/17 1300  OT Visit Information  Last OT Received On 08/12/17  Reason Eval/Treat Not Completed Patient at procedure or test/ unavailable (endo). TEE  [facility], OT/L  [phone] 08/12/2017

## 2017-08-12 NOTE — Discharge Summary (Signed)
Stroke Discharge Summary  Patient ID: Melody Ochoa   MRN: [id number]      DOB: 08/02/1941  Date of Admission: 08/10/2017 Date of Discharge: 08/12/2017  Attending Physician:  Garvin Fila, MD, Stroke MD Consultant(s):   Treatment Team:  Garvin Fila, MD L[facility]cardiology, Rounding, MD rehabilitation medicine Patient's PCP:  Kirk Ruths, MD  DISCHARGE DIAGNOSIS: Principal Problem:   Stroke (cerebrum) ([place]) - Patchy small volume acute ischemic nonhemorrhagic superior left cerebellar  Embolic infarct, left SCA territory, of cryptogenic etiology, with diffuse intracranial atherosclerosis   Past Medical History:  Diagnosis Date  . Arthritis   . Back pain   . Cancer ([place])    skin  . Depression   . Diverticulosis   . Dizziness    patient had episode of dizziness when came in room. hx past no dx  . GERD (gastroesophageal reflux disease)   . Heart murmur   . PONV (postoperative nausea and vomiting)    yrs ago   Past Surgical History:  Procedure Laterality Date  . BACK SURGERY  [place] [facility]  09  . COLONOSCOPY WITH PROPOFOL N/A 01/13/2016   Procedure: COLONOSCOPY WITH PROPOFOL;  Surgeon: [name], MD;  Location: [facility] ENDOSCOPY;  Service: Endoscopy;  Laterality: N/A;  . FACELIFT  94  . TONSILLECTOMY    . TOTAL HIP ARTHROPLASTY Right 04/22/2016   Procedure: TOTAL HIP ARTHROPLASTY;  Surgeon: Corky Mull, MD;  Location: [facility] ORS;  Service: Orthopedics;  Laterality: Right;    Allergies as of 08/12/2017      Reactions   Aleve [naproxen Sodium] Other (See Comments)   Gi upset   Aspirin Other (See Comments)   Stomach pain-aggravates diverticulosis   Celebrex [celecoxib] Other (See Comments)   Dizziness   Effexor [venlafaxine] Other (See Comments)   Hot flashes   Hydrocodone-homatropine Diarrhea   Ibuprofen Other (See Comments)   Gi upset   Tape Itching, Other (See Comments)   Itchy blisters   Vioxx [rofecoxib] Other (See Comments)   Gi upset    Other Rash, Other (See Comments)   bandaides   Tramadol Other (See Comments)   hallucinations      Medication List    STOP taking these medications   pantoprazole 40 MG tablet Commonly known as:  PROTONIX     TAKE these medications   atorvastatin 40 MG tablet Commonly known as:  LIPITOR Take 1 tablet (40 mg total) by mouth daily at 6 PM.   buPROPion 300 MG 24 hr tablet Commonly known as:  WELLBUTRIN XL Take 300 mg by mouth daily.   CALTRATE 600+D PO Take 1 tablet by mouth daily.   clopidogrel 75 MG tablet Commonly known as:  PLAVIX Take 1 tablet (75 mg total) by mouth daily.   DULoxetine 60 MG capsule Commonly known as:  CYMBALTA Take 120 mg by mouth daily.   ferrous sulfate 325 (65 FE) MG tablet Take 1 tablet (325 mg total) by mouth daily.   gabapentin 300 MG capsule Commonly known as:  NEURONTIN Take 300 mg by mouth at bedtime.   VITAMIN B-12 PO Take 1 tablet by mouth daily.   vitamin C 500 MG tablet Commonly known as:  ASCORBIC ACID Take 500 mg by mouth daily.            Discharge Care Instructions        Start     Ordered   08/13/17 0000  clopidogrel (PLAVIX) 75  MG tablet  Daily     08/12/17 1736   08/12/17 0000  atorvastatin (LIPITOR) 40 MG tablet  Daily-1800     08/12/17 1736   08/12/17 0000  ferrous sulfate 325 (65 FE) MG tablet  Daily     08/12/17 1736   08/12/17 0000  Ambulatory referral to Neurology    Comments:  An appointment is requested in approximately: 6 - 8 weeks   08/12/17 1736      LABORATORY STUDIES CBC    Component Value Date/Time   WBC 7.4 08/09/2017 1938   RBC 4.12 08/09/2017 1938   HGB 12.1 08/09/2017 1938   HGB 8.6 (L) 12/02/2014 0610   HCT 35.3 08/09/2017 1938   HCT 26.7 (L) 12/02/2014 0610   PLT 232 08/09/2017 1938   PLT 372 12/02/2014 0610   MCV 85.7 08/09/2017 1938   MCV 87 12/02/2014 0610   MCH 29.4 08/09/2017 1938   MCHC 34.3 08/09/2017 1938   RDW 14.2 08/09/2017 1938   RDW 15.1 (H) 12/02/2014 0610    LYMPHSABS 1.5 08/09/2017 1938   LYMPHSABS 1.4 12/02/2014 0610   MONOABS 0.7 08/09/2017 1938   MONOABS 0.6 12/02/2014 0610   EOSABS 0.3 08/09/2017 1938   EOSABS 0.4 12/02/2014 0610   BASOSABS 0.1 08/09/2017 1938   BASOSABS 0.1 12/02/2014 0610   CMP    Component Value Date/Time   NA 137 08/09/2017 1938   K 3.9 08/09/2017 1938   CL 101 08/09/2017 1938   CO2 25 08/09/2017 1938   GLUCOSE 150 (H) 08/09/2017 1938   BUN 19 08/09/2017 1938   CREATININE 1.06 (H) 08/09/2017 1938   CALCIUM 9.5 08/09/2017 1938   PROT 8.0 08/09/2017 1938   ALBUMIN 4.3 08/09/2017 1938   AST 21 08/09/2017 1938   ALT 17 08/09/2017 1938   ALKPHOS 59 08/09/2017 1938   BILITOT 0.6 08/09/2017 1938   GFRNONAA 49 (L) 08/09/2017 1938   GFRAA 57 (L) 08/09/2017 1938   COAGS Lab Results  Component Value Date   INR 0.91 08/09/2017   INR 0.88 04/21/2016   Lipid Panel    Component Value Date/Time   CHOL 199 08/10/2017 0230   TRIG 39 08/10/2017 0230   HDL 65 08/10/2017 0230   CHOLHDL 3.1 08/10/2017 0230   VLDL 8 08/10/2017 0230   LDLCALC 126 (H) 08/10/2017 0230   HgbA1C  Lab Results  Component Value Date   HGBA1C 5.3 08/10/2017   Urinalysis    Component Value Date/Time   COLORURINE STRAW (A) 08/09/2017 2236   APPEARANCEUR CLEAR (A) 08/09/2017 2236   LABSPEC 1.020 08/09/2017 2236   PHURINE 8.0 08/09/2017 2236   GLUCOSEU 150 (A) 08/09/2017 2236   HGBUR NEGATIVE 08/09/2017 2236   BILIRUBINUR NEGATIVE 08/09/2017 2236   KETONESUR NEGATIVE 08/09/2017 2236   PROTEINUR NEGATIVE 08/09/2017 2236   UROBILINOGEN 1.0 11/22/2014 0033   NITRITE NEGATIVE 08/09/2017 2236   LEUKOCYTESUR NEGATIVE 08/09/2017 2236   Urine Drug Screen     Component Value Date/Time   LABOPIA NONE DETECTED 08/09/2017 2236   COCAINSCRNUR NONE DETECTED 08/09/2017 2236   LABBENZ NONE DETECTED 08/09/2017 2236   AMPHETMU NONE DETECTED 08/09/2017 2236   THCU NONE DETECTED 08/09/2017 2236   LABBARB NONE DETECTED 08/09/2017 2236     Alcohol Level    Component Value Date/Time   ETH <5 08/09/2017 1938     SIGNIFICANT DIAGNOSTIC STUDIES Ct Angio Head W Or Wo Contrast Ct Angio Neck W And/or Wo Contrast 08/09/2017 IMPRESSION: 1. Negative CTA for emergent  large vessel occlusion. 2. Moderate atherosclerotic disease involving the major arterial vasculature of the head and neck as above, most evident about the aortic arch and origin of the great vessels. No high-grade or flow-limiting stenosis identified. 3. Stable appearance of the brain. No acute intracranial hemorrhage status post tPA administration.  Mr Brain Wo Contrast 08/10/2017 IMPRESSION: 1. Patchy small volume acute ischemic nonhemorrhagic superior left cerebellar infarct, left SCA territory. 2. Remote left parietal and left cerebellar infarcts. 3. Mild chronic microvascular ischemic disease.   Ct Head Code Stroke Wo Contrast 08/09/2017 IMPRESSION: 1. No intracranial hemorrhage or definite acute infarct identified. 2. New left parietal infarct, likely subacute. 3. Small chronic left cerebellar infarct. 4. ASPECTS is 10.  TTE 08/11/2017 Left ventricle: The cavity size was normal. Wall thickness was increased in a pattern of mild LVH. Systolic function was normal. The estimated ejection fraction was in the range of 50% to 55%. Wall motion was normal; there were no regional wall motionabnormalities   TEE 08/11/2017 Normal LV size and function EF 50% Normal RV size and function Normal RA Mild to moderately dilated LA Normal TV with trivial TR Normal PV with trivial PR Normal MV with mild MR Normal trileaflet AV with trivial AR Lipomatous interatrial septum.  The midportion is thin and aneurysmal bowing into the LA c/w increased LA pressure.   No evidence of shunt by colorflow dopper or agitated saline contrast injection. Large bulky plaque in the aortic arch - recommend Chest CT angio for further evaluation or MRI    HISTORY OF PRESENT ILLNESS Charrise Lardner  McCauleyis a 76 y.o.femalewho  presented to [facility] on 08[facility]17  with acute onset dizziness started around 6:15 to 6:30 PM. She   was evaluated by teleneurology and felt to be having a small cerebellar or brainstem stroke. She was treated with IV TPA and transferred to [facility] She has had markedimprovement in the interim LKW: 6:30 PM 08/09/17 tpa given?: Yes at [facility] NIHSS: 1  This morning she is sitting at the bedside working with therapist. She feels her dizziness and balance a lot improved. She denies any headache. She still feels off balance and has some coordination difficulties. She had an MRI scan the brain done which I personally reviewed shows small patchy left superior cerebellar infarct. There is also evidence of old left cerebellar as well as old left high parietal cortical infarct. She denies any prior history of strokes or any neurological workup. She states she did have a similar episode of dizziness in the past and was seen in the emergency room and was sent home and did not get a complete stroke workup.  HOSPITAL COURSE Ms. KAYZLEE WIRTANEN is a 75 y.o. female with history ofhistory of dizziness and heart murmur who presented to [facility] on 08/09/17 with acute onset dizziness started around 6:15 to 6:30 PM. She was evaluated by teleneurology and felt to be having a small cerebellar or brainstem stroke. She was treated with IV TPA and transferred to [facility] has had markedimprovement in the interim.   Stroke: Patchy small volume acute ischemic nonhemorrhagic superior left cerebellar infarct, left SCA territory, likely embolic, in the setting of diffuse moderate aortic and carotid atherosclerosis.  Resultant  Gait imbalance  CT head:  New left parietal infarct, likely subacute. Small chronic left cerebellar infarct.   MRI head: Patchy small volume acute ischemic nonhemorrhagic superior left cerebellar infarct, left SCA territory  MRA head: not performed  CTA head/neck: No LVO or high-grade  stenosis.  Diffuse moderate aortic and carotid atherosclerosis  2D Echo:Left ventricle: The cavity size was normal. Wall thickness was increased in a pattern of mild LVH. Systolic function was normal. The estimated ejection fraction was in the range of 50% to 55%. Wall motion was normal; there were no regional wall motion abnormalities   LDL: 126  HgbA1c 5.3  SCDs for VTE prophylaxis  Diet regular Room service appropriate? Yes; Fluid consistency: Thin  Diet NPO time specified  Diet clear liquid Room service appropriate? Yes; Fluid consistency: Thin  No antithrombotic prior to admission, now on clopidogrel 75 mg daily  Patient counseled to be compliant with her antithrombotic medications  Ongoing aggressive stroke risk factor management  Therapy recommendations: Home Health PT - spouse to provide supervision at home  Disposition: Home/Self Care  Hypertension  Stable  Permissive hypertension (OK if < 220/120) but gradually normalize in 5-7 days  Long-term BP goal normotensive  Hyperlipidemia  Home meds: none  LDL 126, goal < 70  Add Atorvastatin 40 mg PO daily   Continue statin at discharge  Other Stroke Risk Factors  Advanced age  ETOH use, advised to drink no more than 1 drink(s) a day  Obesity, Body mass index is 33.3 kg/m., recommend weight loss, diet and exercise as appropriate   Other Active Problems  None  DISCHARGE EXAM Blood pressure (!) 175/75, pulse 71, temperature 98.2 F (36.8 C), temperature source Oral, resp. rate 11, height 5\' 4"  (1.626 m), weight 88 kg (194 lb), SpO2 99 %. Pleasant elderly Caucasian lady currently not in distress.. Afebrile. Head is nontraumatic. Neck is supple without bruit.  Cardiac exam no murmur or gallop. Lungs are clear to auscultation. Distal pulses are well felt. Neurological Exam;  Awake Alert oriented x 3. Normal speech and language.eye movements full without nystagmus.fundi were not visualized.  Vision acuity and fields appear normal. Hearing is normal. Palatal movements are normal. Face symmetric. Tongue midline. Normal strength, tone, reflexes and coordination. Normal sensation. Mild truncal ataxia and leans to the right side. Gait deferred.  Discharge Diet   Diet regular Room service appropriate? Yes; Fluid consistency: Thin liquids  DISCHARGE PLAN  Disposition: Discharge home with Home Health PT  clopidogrel 75 mg daily for secondary stroke prevention.  Ongoing risk factor control by Primary Care Physician at time of discharge  Follow-up Kirk Ruths, MD in 2 weeks.  Follow-up with Antony Contras, Stroke Clinic in 6 weeks, office to schedule an appointment.  11minutes were spent preparing discharge. I have personally examined this patient, reviewed notes, independently viewed imaging studies, participated in medical decision making and plan of care.ROS completed by me personally and pertinent positives fully documented  I have made any additions or clarifications directly to the above note. Agree with note above.    Antony Contras, MD Medical Director [facility] Pager: [phone] 08/12/2017 9:55 PM

## 2017-08-13 DIAGNOSIS — I63212 Cerebral infarction due to unspecified occlusion or stenosis of left vertebral arteries: Secondary | ICD-10-CM

## 2017-08-13 MED ORDER — ACETAMINOPHEN-CODEINE #3 300-30 MG PO TABS
2.0000 | ORAL_TABLET | Freq: Once | ORAL | Status: AC | PRN
  Administered 2017-08-13: 2 via ORAL
  Filled 2017-08-13: qty 2

## 2017-08-13 NOTE — Progress Notes (Signed)
Ambulatory referral made to Cardiology - EP for outpatient loop implant. Birdie Sons also notified of plan.  Mikey Bussing PA-C [facility] Pager [phone] 08/13/2017, 10:49 AM

## 2017-08-13 NOTE — Progress Notes (Signed)
Pt discharged at this time taking all personal belongings. IV discontinued, dry dressing applied. Discharge instructions with prescriptions provided with verbal understanding. Pt will follow up with Md per dc summary. No noted distress.

## 2017-08-13 NOTE — Care Management Note (Signed)
Case Management Note  Patient Details  Name: Melody Ochoa MRN: [id number] Date of Birth: 05-22-1941  Subjective/Objective: 76 y.o. F admitted with CVA, to be discharged with HHPT. She has chosen [facility] and I have alerted Jermaine who will contact her within 24-48 hrs of discharge.  No DME needs. Verified Home address and phone listed on face-sheet  as correct.                   Action/Plan:CM will sign off for now but will be available should additional discharge needs arise or disposition change.    Expected Discharge Date:  08/12/17               Expected Discharge Plan:  [place]  In-House Referral:     Discharge planning Services  CM Consult  Post Acute Care Choice:  Home Health Choice offered to:  Patient  DME Arranged:  N/A (Has all DME from Hip replacement surgery) DME Agency:  NA  HH Arranged:  PT HH Agency:  [facility]  Status of Service:  Completed, signed off  If discussed at [place] of Stay Meetings, dates discussed:    Additional Comments:  Delrae Sawyers, RN 08/13/2017, 8:41 AM

## 2017-08-14 ENCOUNTER — Encounter (HOSPITAL_COMMUNITY): Payer: Self-pay | Admitting: Cardiology

## 2017-08-16 DIAGNOSIS — D649 Anemia, unspecified: Secondary | ICD-10-CM | POA: Diagnosis not present

## 2017-08-16 DIAGNOSIS — M6281 Muscle weakness (generalized): Secondary | ICD-10-CM | POA: Diagnosis not present

## 2017-08-16 DIAGNOSIS — F329 Major depressive disorder, single episode, unspecified: Secondary | ICD-10-CM | POA: Diagnosis not present

## 2017-08-16 DIAGNOSIS — M1990 Unspecified osteoarthritis, unspecified site: Secondary | ICD-10-CM | POA: Diagnosis not present

## 2017-08-16 DIAGNOSIS — M4316 Spondylolisthesis, lumbar region: Secondary | ICD-10-CM | POA: Diagnosis not present

## 2017-08-16 DIAGNOSIS — Z96641 Presence of right artificial hip joint: Secondary | ICD-10-CM | POA: Diagnosis not present

## 2017-08-16 DIAGNOSIS — I69393 Ataxia following cerebral infarction: Secondary | ICD-10-CM | POA: Diagnosis not present

## 2017-08-17 ENCOUNTER — Telehealth: Payer: Self-pay | Admitting: Nurse Practitioner

## 2017-08-17 NOTE — Telephone Encounter (Signed)
Received referral for ILR implant to evaluate for cryptogenic stroke.  Spoke with patient who is agreeable. Scheduled for 08/23/17 at 7AM. Pt to arrive at 6:30AM.  Pt aware and agrees with plan.  Chanetta Marshall, NP 08/17/2017 10:16 AM

## 2017-08-17 NOTE — Telephone Encounter (Signed)
Thanks much, Museum/gallery conservator.   Rosalin Hawking, MD PhD Stroke Neurology 08/17/2017 5:07 PM

## 2017-08-19 ENCOUNTER — Emergency Department: Payer: Medicare Other

## 2017-08-19 ENCOUNTER — Emergency Department
Admission: EM | Admit: 2017-08-19 | Discharge: 2017-08-19 | Disposition: A | Discharge status: Home / Self Care | Payer: Medicare Other | Attending: Emergency Medicine | Admitting: Emergency Medicine

## 2017-08-19 ENCOUNTER — Encounter: Payer: Self-pay | Admitting: Emergency Medicine

## 2017-08-19 DIAGNOSIS — R0602 Shortness of breath: Secondary | ICD-10-CM | POA: Insufficient documentation

## 2017-08-19 DIAGNOSIS — Z96641 Presence of right artificial hip joint: Secondary | ICD-10-CM | POA: Diagnosis not present

## 2017-08-19 DIAGNOSIS — Z79899 Other long term (current) drug therapy: Secondary | ICD-10-CM | POA: Insufficient documentation

## 2017-08-19 DIAGNOSIS — R918 Other nonspecific abnormal finding of lung field: Secondary | ICD-10-CM | POA: Diagnosis not present

## 2017-08-19 DIAGNOSIS — I69393 Ataxia following cerebral infarction: Secondary | ICD-10-CM | POA: Diagnosis not present

## 2017-08-19 DIAGNOSIS — M79602 Pain in left arm: Secondary | ICD-10-CM | POA: Diagnosis not present

## 2017-08-19 DIAGNOSIS — F329 Major depressive disorder, single episode, unspecified: Secondary | ICD-10-CM | POA: Diagnosis not present

## 2017-08-19 DIAGNOSIS — M4316 Spondylolisthesis, lumbar region: Secondary | ICD-10-CM | POA: Diagnosis not present

## 2017-08-19 DIAGNOSIS — M1990 Unspecified osteoarthritis, unspecified site: Secondary | ICD-10-CM | POA: Diagnosis not present

## 2017-08-19 DIAGNOSIS — Z87891 Personal history of nicotine dependence: Secondary | ICD-10-CM | POA: Diagnosis not present

## 2017-08-19 DIAGNOSIS — D649 Anemia, unspecified: Secondary | ICD-10-CM | POA: Diagnosis not present

## 2017-08-19 DIAGNOSIS — M6281 Muscle weakness (generalized): Secondary | ICD-10-CM | POA: Diagnosis not present

## 2017-08-19 HISTORY — DX: Cerebral infarction, unspecified: I63.9

## 2017-08-19 LAB — CBC
HCT: 32.9 % — ABNORMAL LOW (ref 35.0–47.0)
Hemoglobin: 11.2 g/dL — ABNORMAL LOW (ref 12.0–16.0)
MCH: 29.1 pg (ref 26.0–34.0)
MCHC: 34 g/dL (ref 32.0–36.0)
MCV: 85.8 fL (ref 80.0–100.0)
Platelets: 318 10*3/uL (ref 150–440)
RBC: 3.84 MIL/uL (ref 3.80–5.20)
RDW: 14.2 % (ref 11.5–14.5)
WBC: 5.8 10*3/uL (ref 3.6–11.0)

## 2017-08-19 LAB — BASIC METABOLIC PANEL
Anion gap: 8 (ref 5–15)
BUN: 10 mg/dL (ref 6–20)
CO2: 28 mmol/L (ref 22–32)
Calcium: 9.3 mg/dL (ref 8.9–10.3)
Chloride: 104 mmol/L (ref 101–111)
Creatinine, Ser: 1.01 mg/dL — ABNORMAL HIGH (ref 0.44–1.00)
GFR calc Af Amer: >60 mL/min (ref >60)
GFR calc non Af Amer: 52 mL/min — ABNORMAL LOW (ref >60)
Glucose, Bld: 81 mg/dL (ref 65–99)
Potassium: 3.7 mmol/L (ref 3.5–5.1)
Sodium: 140 mmol/L (ref 135–145)

## 2017-08-19 LAB — TROPONIN I: Troponin I: <0.03 ng/mL (ref <0.03)

## 2017-08-19 MED ORDER — IOPAMIDOL (ISOVUE-300) INJECTION 61%
75.0000 mL | Freq: Once | INTRAVENOUS | Status: AC | PRN
  Administered 2017-08-19: 75 mL via INTRAVENOUS

## 2017-08-19 NOTE — ED Provider Notes (Signed)
[facility] Emergency Department Provider Note   ____________________________________________   First MD Initiated Contact with Patient 08/19/17 1855     (approximate)  I have reviewed the triage vital signs and the nursing notes.   HISTORY  Chief Complaint Arm Pain   HPI Melody Ochoa is a 76 y.o. female Who complains of pain radiating from the back of the shoulder and neck into the left arm. Pain is worse with movement of the arm is worse when she walks slowly increasing in severity.Patient reports shortness of breath which is bad and prevents her from walking more than a block or so. But that has been stable for many months.patient had similar symptoms in the hospital with her stroke.here are somewhat worse now though.   Past Medical History:  Diagnosis Date  . Arthritis   . Back pain   . Cancer ([place])    skin  . Depression   . Diverticulosis   . Dizziness    patient had episode of dizziness when came in room. hx past no dx  . GERD (gastroesophageal reflux disease)   . Heart murmur   . PONV (postoperative nausea and vomiting)    yrs ago  . Stroke [facility])     Patient Active Problem List   Diagnosis Date Noted  . Stroke (cerebrum) (HCC) - Patchy small volume acute ischemic nonhemorrhagic superior left cerebellar infarct, left SCA territory, of undetermined etiology, with diffuse atherosclerosis 08/10/2017  . Status post total replacement of right hip 04/22/2016  . Personal history of colonic polyps   . Back pain 11/22/2014  . UTI (lower urinary tract infection) 11/22/2014  . Sepsis ([place]) 11/22/2014  . Low back pain 11/22/2014  . Fever 11/22/2014  . Normocytic anemia 11/22/2014  . Spondylolisthesis of lumbar region 11/18/2014    Past Surgical History:  Procedure Laterality Date  . BACK SURGERY  [place] SURGERY  09  . COLONOSCOPY WITH PROPOFOL N/A 01/13/2016   Procedure: COLONOSCOPY WITH PROPOFOL;  Surgeon: [name], MD;   Location: [facility] ENDOSCOPY;  Service: Endoscopy;  Laterality: N/A;  . FACELIFT  89  . TEE WITHOUT CARDIOVERSION N/A 08/12/2017   Procedure: TRANSESOPHAGEAL ECHOCARDIOGRAM (TEE);  Surgeon: Sueanne Margarita, MD;  Location: [facility] ENDOSCOPY;  Service: Cardiovascular;  Laterality: N/A;  . TONSILLECTOMY    . TOTAL HIP ARTHROPLASTY Right 04/22/2016   Procedure: TOTAL HIP ARTHROPLASTY;  Surgeon: Corky Mull, MD;  Location: [facility] ORS;  Service: Orthopedics;  Laterality: Right;    Prior to Admission medications   Medication Sig Start Date End Date Taking? Authorizing Provider  atorvastatin (LIPITOR) 40 MG tablet Take 1 tablet (40 mg total) by mouth daily at 6 PM. 08/12/17   Patteson, Arlan Organ, NP  buPROPion (WELLBUTRIN XL) 300 MG 24 hr tablet Take 300 mg by mouth daily.  07/15/17   [provider]  Calcium Carbonate-Vitamin D (CALTRATE 600+D PO) Take 1 tablet by mouth daily.    [provider]  clopidogrel (PLAVIX) 75 MG tablet Take 1 tablet (75 mg total) by mouth daily. 08/13/17   Patteson, Arlan Organ, NP  Cyanocobalamin (VITAMIN B-12 PO) Take 1 tablet by mouth daily.    [provider]  DULoxetine (CYMBALTA) 60 MG capsule Take 120 mg by mouth daily.     [provider]  ferrous sulfate 325 (65 FE) MG tablet Take 1 tablet (325 mg total) by mouth daily. 08/12/17   Patteson, Arlan Organ, NP  gabapentin (NEURONTIN) 300 MG  capsule Take 300 mg by mouth at bedtime.  09/01/14   [provider]  vitamin C (ASCORBIC ACID) 500 MG tablet Take 500 mg by mouth daily.    [provider]    Allergies Aleve [naproxen sodium]; Aspirin; Celebrex [celecoxib]; Effexor [venlafaxine]; Hydrocodone-homatropine; Ibuprofen; Tape; Vioxx [rofecoxib]; Other; and Tramadol  History reviewed. No pertinent family history.  Social History Social History  Substance Use Topics  . Smoking status: Former Smoker    Packs/day: 1.00    Years: 40.00    Types: Cigarettes    Quit date: 11/13/1996  .  Smokeless tobacco: Never Used  . Alcohol use Yes     Comment: occ wine    Review of Systems  Constitutional: No fever/chills Eyes: No visual changes. ENT: No sore throat. Cardiovascular: Denies chest pain. Respiratory: Denies shortness of breath. Gastrointestinal: No abdominal pain.  No nausea, no vomiting.  No diarrhea.  No constipation. Genitourinary: Negative for dysuria. Musculoskeletal: Negative for back pain. Skin: Negative for rash. Neurological: Negative for headaches, focal weakness   ____________________________________________   PHYSICAL EXAM:  VITAL SIGNS: ED Triage Vitals  Enc Vitals Group     BP 08/19/17 1857 (!) 156/77     Pulse Rate 08/19/17 1857 86     Resp 08/19/17 1857 (!) 22     Temp 08/19/17 1857 98.6 F (37 C)     Temp Source 08/19/17 1857 Oral     SpO2 08/19/17 1857 99 %     Weight 08/19/17 1850 195 lb (88.5 kg)     Height 08/19/17 1850 5\' 4"  (1.626 m)     Head Circumference --      Peak Flow --      Pain Score 08/19/17 1850 7     Pain Loc --      Pain Edu? --      Excl. in [place]? --     Constitutional: Alert and oriented. Well appearing and in no acute distress. Eyes: Conjunctivae are normal. PERRL. EOMI. Head: Atraumatic. Nose: No congestion/rhinnorhea. Mouth/Throat: Mucous membranes are moist.  Oropharynx non-erythematous. Neck: No stridor.   Cardiovascular: Normal rate, regular rhythm. Grossly normal heart sounds.  Good peripheral circulation. Respiratory: Normal respiratory effort.  No retractions. Lungs CTAB. Gastrointestinal: Soft and nontender. No distention. No abdominal bruits. No CVA tenderness. Musculoskeletal: No lower extremity tenderness nor edema.  No joint effusions. Neurologic:  Normal speech and language. No gross focal neurologic deficits are appreciated. No gait instability. Skin:  Skin is warm, dry and intact. No rash noted. Psychiatric: Mood and affect are normal. Speech and behavior are  normal.  ____________________________________________   LABS (all labs ordered are listed, but only abnormal results are displayed)  Labs Reviewed  BASIC METABOLIC PANEL - Abnormal; Notable for the following:       Result Value   Creatinine, Ser 1.01 (*)    GFR calc non Af Amer 52 (*)    All other components within normal limits  CBC - Abnormal; Notable for the following:    Hemoglobin 11.2 (*)    HCT 32.9 (*)    All other components within normal limits  TROPONIN I   ____________________________________________  EKG KG read and interpreted by me shows normal sinus rhythm rate of 84 left axis nonspecific ST-T wave changes ____________________________________________  RADIOLOGY  Dg Chest 2 View  Result Date: 08/19/2017 CLINICAL DATA:  Chest and left arm pain EXAM: CHEST  2 VIEW COMPARISON:  Apr 21, 2016 FINDINGS: There are areas of scarring bilaterally,  stable. There is no edema or consolidation. There is a 1.4 x 1.3 cm nodular opacity in the left upper lobe region. Heart size and pulmonary vascularity are normal. There is aortic atherosclerosis. No evident adenopathy. There is postoperative change in the lower cervical spine. There is degenerative change in the thoracic spine. IMPRESSION: 1.4 x 1.3 cm nodular opacity left upper lobe. This finding warrants noncontrast enhanced chest CT to further assess. Areas of scarring bilaterally. No edema or consolidation. Stable cardiac silhouette. There is aortic atherosclerosis. Aortic Atherosclerosis (ICD10-I70.0). Electronically Signed   By: Lowella Grip III M.D.   On: 08/19/2017 19:18   Ct Chest W Contrast  Result Date: 08/19/2017 CLINICAL DATA:  Left arm pain radiating to the shoulder and back. Shortness breath. Chest radiograph demonstrated a nodular opacity along the left upper chest. EXAM: CT CHEST WITH CONTRAST TECHNIQUE: Multidetector CT imaging of the chest was performed during intravenous contrast administration. CONTRAST:   61mL ISOVUE-300 IOPAMIDOL (ISOVUE-300) INJECTION 61% COMPARISON:  Overlapping portions of CT neck from 08/09/2017. Chest radiograph from 08/19/2017. FINDINGS: Cardiovascular: Coronary, aortic arch, and branch vessel atherosclerotic vascular disease. Mediastinum/Nodes: Small mediastinal lymph nodes are not pathologically enlarged by size criteria. Right lower paratracheal node 0.9 cm in short axis image 52/2. A right hilar node measures 0.8 cm in short axis on image 69/5. Lungs/Pleura: Mild biapical pleuroparenchymal scarring. Scattered peripheral interstitial accentuation with some honeycombing. 1.2 by 0.9 by the 1.3 cm sub solid pulmonary nodule in the left upper lobe on image 51/3 corresponding to the finding on chest radiography. Upper Abdomen: 2.2 by 1.9 cm fluid density lesion inferiorly in the right hepatic lobe on image 151/2, similar to 09/09/2009 hence considered benign. Fluid density 1.3 cm exophytic lesion of the right kidney upper pole, previously 0.6 cm back on 09/09/2009, likely a cyst. Cortical thinning in the left kidney upper pole suggesting mild scarring or atrophy. Low-density fullness of the left adrenal gland stable from 2010 and favoring a small adrenal adenoma. Musculoskeletal: Thoracic spondylosis. IMPRESSION: 1. The finding on chest radiography corresponds to a 1.2 by 0.9 by 1.3 cm sub solid pulmonary nodule. Initial follow-up with CT at 6-12 months is recommended to confirm persistence. If persistent, repeat CT is recommended every 2 years until 5 years of stability has been established. This recommendation follows the consensus statement: Guidelines for Management of Incidental Pulmonary Nodules Detected on CT Images: From the Fleischner Society 2017; Radiology 2017; 284:228-243. 2. Scattered scarring and honeycombing in the lungs. 3.  Aortic Atherosclerosis (ICD10-I70.0).  Coronary atherosclerosis. 4. Left kidney upper pole renal scarring or atrophy. 5. Small left adrenal adenoma. 6.  Stable right hepatic lobe cyst from 2010. Probable cyst of the right kidney upper pole. Electronically Signed   By: Van Clines M.D.   On: 08/19/2017 20:57    ____________________________________________   PROCEDURES  Procedure(s) performed:   Procedures  Critical Care performed:   ____________________________________________   INITIAL IMPRESSION / ASSESSMENT AND PLAN / ED COURSE  Pertinent labs & imaging results that were available during my care of the patient were reviewed by me and considered in my medical decision making (see chart for details).        ____________________________________________   FINAL CLINICAL IMPRESSION(S) / ED DIAGNOSES  Final diagnoses:  Left arm pain      NEW MEDICATIONS STARTED DURING THIS VISIT:  New Prescriptions   No medications on file     Note:  This document was prepared using Dragon voice recognition software and may include  unintentional dictation errors.    Nena Polio, MD 08/19/17 2251

## 2017-08-19 NOTE — ED Notes (Signed)
Patient reports being admitted to hospital on 9/4 for stroke, and being discharged following weekend.  Pt c/o left arm pain radiating into shoulder, SOB, and intermittent diaphoresis. Pt reports having same symptoms during and since stroke, they have just increased in severity. Pt reports she was placed on Plavix and cholesterol medication after stroke.

## 2017-08-19 NOTE — ED Notes (Signed)
Reviewed d/c instructions and follow-up care. Patient verbalized understanding.

## 2017-08-19 NOTE — ED Triage Notes (Signed)
Pt c/o left arm pain that radiates into shoulder/back.  Pain has present X 1 month and is constant but has gotten worse.  No cough. No fevers that pt is aware of.  Gets SHOB easily.

## 2017-08-19 NOTE — Discharge Instructions (Signed)
The tests that we did here tonight look okay. They recommended a repeat CT scan to keep an eye on the nodule in the left lung. Please follow-up with your primary care doctor he will arrange that for you. I will also have you follow up with cardiology just in case. Dr. Clayborn Bigness is on call. Please call his office first thing Monday morning they will arrange a follow-up appointment for you very quickly. Please return here for increasing pain or shortness of breath. For the time being use Tylenol for the pain.

## 2017-08-22 DIAGNOSIS — I69393 Ataxia following cerebral infarction: Secondary | ICD-10-CM | POA: Diagnosis not present

## 2017-08-22 DIAGNOSIS — M6281 Muscle weakness (generalized): Secondary | ICD-10-CM | POA: Diagnosis not present

## 2017-08-22 DIAGNOSIS — M4316 Spondylolisthesis, lumbar region: Secondary | ICD-10-CM | POA: Diagnosis not present

## 2017-08-22 DIAGNOSIS — F329 Major depressive disorder, single episode, unspecified: Secondary | ICD-10-CM | POA: Diagnosis not present

## 2017-08-22 DIAGNOSIS — M1990 Unspecified osteoarthritis, unspecified site: Secondary | ICD-10-CM | POA: Diagnosis not present

## 2017-08-22 DIAGNOSIS — D649 Anemia, unspecified: Secondary | ICD-10-CM | POA: Diagnosis not present

## 2017-08-23 ENCOUNTER — Ambulatory Visit (HOSPITAL_COMMUNITY)
Admission: RE | Admit: 2017-08-23 | Discharge: 2017-08-23 | Disposition: A | Discharge status: Home / Self Care | Payer: Medicare Other | Source: Ambulatory Visit | Attending: Internal Medicine | Admitting: Internal Medicine

## 2017-08-23 ENCOUNTER — Encounter (HOSPITAL_COMMUNITY): Payer: Self-pay | Admitting: Internal Medicine

## 2017-08-23 ENCOUNTER — Encounter (HOSPITAL_COMMUNITY)
Admission: RE | Disposition: A | Discharge status: Home / Self Care | Payer: Self-pay | Source: Ambulatory Visit | Attending: Internal Medicine

## 2017-08-23 DIAGNOSIS — Z7902 Long term (current) use of antithrombotics/antiplatelets: Secondary | ICD-10-CM | POA: Insufficient documentation

## 2017-08-23 DIAGNOSIS — Z8673 Personal history of transient ischemic attack (TIA), and cerebral infarction without residual deficits: Secondary | ICD-10-CM | POA: Diagnosis not present

## 2017-08-23 DIAGNOSIS — Z85828 Personal history of other malignant neoplasm of skin: Secondary | ICD-10-CM | POA: Insufficient documentation

## 2017-08-23 DIAGNOSIS — Z87891 Personal history of nicotine dependence: Secondary | ICD-10-CM | POA: Diagnosis not present

## 2017-08-23 DIAGNOSIS — I639 Cerebral infarction, unspecified: Secondary | ICD-10-CM | POA: Insufficient documentation

## 2017-08-23 DIAGNOSIS — Z79899 Other long term (current) drug therapy: Secondary | ICD-10-CM | POA: Diagnosis not present

## 2017-08-23 DIAGNOSIS — M1990 Unspecified osteoarthritis, unspecified site: Secondary | ICD-10-CM | POA: Diagnosis not present

## 2017-08-23 DIAGNOSIS — Z886 Allergy status to analgesic agent status: Secondary | ICD-10-CM | POA: Insufficient documentation

## 2017-08-23 DIAGNOSIS — Z96641 Presence of right artificial hip joint: Secondary | ICD-10-CM | POA: Diagnosis not present

## 2017-08-23 DIAGNOSIS — I638 Other cerebral infarction: Secondary | ICD-10-CM | POA: Diagnosis not present

## 2017-08-23 HISTORY — PX: LOOP RECORDER INSERTION: EP1214

## 2017-08-23 SURGERY — LOOP RECORDER INSERTION

## 2017-08-23 MED ORDER — LIDOCAINE-EPINEPHRINE 1 %-1:100000 IJ SOLN
INTRAMUSCULAR | Status: AC
  Filled 2017-08-23: qty 1

## 2017-08-23 MED ORDER — LIDOCAINE-EPINEPHRINE 1 %-1:100000 IJ SOLN
INTRAMUSCULAR | Status: DC | PRN
  Administered 2017-08-23: 2 mL

## 2017-08-23 SURGICAL SUPPLY — 2 items
LOOP REVEAL LINQSYS (Prosthesis & Implant Heart) ×2 IMPLANT
PACK LOOP INSERTION (CUSTOM PROCEDURE TRAY) ×2 IMPLANT

## 2017-08-23 NOTE — H&P (View-Only) (Signed)
ELECTROPHYSIOLOGY CONSULT NOTE  Patient ID: ALMA MOHIUDDIN MRN: [id number], DOB/AGE: [birth year]   Date of Consult: 08/23/2017  Primary Physician: Kirk Ruths, MD Primary Cardiologist: Paraschos  Reason for Consultation: Cryptogenic stroke; recommendations regarding Implantable Loop Recorder (requested by Dr Erlinda Hong).  History of Present Illness EP has been asked to evaluate [name] for placement of an implantable loop recorder to monitor for atrial fibrillation by Dr Erlinda Hong.  The patient was admitted recently with acute onset dizziness and was found to have a small cerebellar or brainstem stroke. She has undergone workup for stroke including echocardiogram and carotid dopplers.  The patient was monitored on telemetry which has demonstrated sinus rhythm with no arrhythmias.  TEE demonstrated no cause for stroke.   Echocardiogram demonstrated EF 50-55%, mild LVH, grade 1 diastolic dysfunction, LA 44.   Prior to admission, the patient denies chest pain, shortness of breath, dizziness, palpitations, or syncope.    Past Medical History:  Diagnosis Date  . Arthritis   . Back pain   . Cancer ([place])    skin  . Depression   . Diverticulosis   . Dizziness    patient had episode of dizziness when came in room. hx past no dx  . GERD (gastroesophageal reflux disease)   . Heart murmur   . PONV (postoperative nausea and vomiting)    yrs ago  . Stroke [facility])      Surgical History:  Past Surgical History:  Procedure Laterality Date  . BACK SURGERY  [place] SURGERY  09  . COLONOSCOPY WITH PROPOFOL N/A 01/13/2016   Procedure: COLONOSCOPY WITH PROPOFOL;  Surgeon: [name], MD;  Location: [facility] ENDOSCOPY;  Service: Endoscopy;  Laterality: N/A;  . FACELIFT  94  . TEE WITHOUT CARDIOVERSION N/A 08/12/2017   Procedure: TRANSESOPHAGEAL ECHOCARDIOGRAM (TEE);  Surgeon: Sueanne Margarita, MD;  Location: [facility] ENDOSCOPY;  Service: Cardiovascular;  Laterality: N/A;  . TONSILLECTOMY      . TOTAL HIP ARTHROPLASTY Right 04/22/2016   Procedure: TOTAL HIP ARTHROPLASTY;  Surgeon: Corky Mull, MD;  Location: [facility] ORS;  Service: Orthopedics;  Laterality: Right;     Prescriptions Prior to Admission  Medication Sig Dispense Refill Last Dose  . atorvastatin (LIPITOR) 40 MG tablet Take 1 tablet (40 mg total) by mouth daily at 6 PM. 30 tablet 0   . buPROPion (WELLBUTRIN XL) 300 MG 24 hr tablet Take 300 mg by mouth daily.    08/09/2017 at Unknown time  . Calcium Carbonate-Vitamin D (CALTRATE 600+D PO) Take 1 tablet by mouth daily.   08/09/2017 at Unknown time  . clopidogrel (PLAVIX) 75 MG tablet Take 1 tablet (75 mg total) by mouth daily. 30 tablet 0   . Cyanocobalamin (VITAMIN B-12 PO) Take 1 tablet by mouth daily.   08/09/2017 at Unknown time  . DULoxetine (CYMBALTA) 60 MG capsule Take 120 mg by mouth daily.    08/09/2017 at Unknown time  . ferrous sulfate 325 (65 FE) MG tablet Take 1 tablet (325 mg total) by mouth daily. 30 tablet 0   . gabapentin (NEURONTIN) 300 MG capsule Take 300 mg by mouth at bedtime.   3 Past Week at Unknown time  . vitamin C (ASCORBIC ACID) 500 MG tablet Take 500 mg by mouth daily.   08/09/2017 at Unknown time    Inpatient Medications:   Allergies:  Allergies  Allergen Reactions  . Aleve [Naproxen Sodium] Other (See Comments)    Gi upset  . Aspirin  Other (See Comments)    Stomach pain-aggravates diverticulosis  . Celebrex [Celecoxib] Other (See Comments)    Dizziness   . Effexor [Venlafaxine] Other (See Comments)    Hot flashes   . Hydrocodone-Homatropine Diarrhea  . Ibuprofen Other (See Comments)    Gi upset   . Tape Itching and Other (See Comments)    Itchy blisters  . Vioxx [Rofecoxib] Other (See Comments)    Gi upset  . Other Rash and Other (See Comments)    bandaides  . Tramadol Other (See Comments)    hallucinations    Social History   Social History  . Marital status: Married    Spouse name: N/A  . Number of children: N/A  . Years of  education: N/A   Occupational History  . Not on file.   Social History Main Topics  . Smoking status: Former Smoker    Packs/day: 1.00    Years: 40.00    Types: Cigarettes    Quit date: 11/13/1996  . Smokeless tobacco: Never Used  . Alcohol use Yes     Comment: occ wine  . Drug use: No  . Sexual activity: Not on file   Other Topics Concern  . Not on file   Social History Narrative  . No narrative on file     Family History: no premature CAD     Review of Systems: All other systems reviewed and are otherwise negative except as noted above.  Physical Exam: Vitals:   08/23/17 0716  BP: (!) 153/75  Pulse: 87  Temp: 98.5 F (36.9 C)  TempSrc: Oral  SpO2: 98%  Weight: 190 lb (86.2 kg)  Height: 5\' 4"  (1.626 m)    GEN- The patient is elderly appearing, alert and oriented x 3 today.   Head- normocephalic, atraumatic Eyes-  Sclera clear, conjunctiva pink Ears- hearing intact Oropharynx- clear Neck- supple Lungs- Clear to ausculation bilaterally, normal work of breathing Heart- Regular rate and rhythm  GI- soft, NT, ND, + BS Extremities- no clubbing, cyanosis, or edema MS- no significant deformity or atrophy Skin- no rash or lesion Psych- euthymic mood, full affect   Labs:   Lab Results  Component Value Date   WBC 5.8 08/19/2017   HGB 11.2 (L) 08/19/2017   HCT 32.9 (L) 08/19/2017   MCV 85.8 08/19/2017   PLT 318 08/19/2017    Recent Labs Lab 08/19/17 1852  NA 140  K 3.7  CL 104  CO2 28  BUN 10  CREATININE 1.01*  CALCIUM 9.3  GLUCOSE 81     Radiology/Studies: Ct Angio Head W Or Wo Contrast Result Date: 08/09/2017 CLINICAL DATA:  Initial evaluation for acute dizziness, left upper extremity weakness. EXAM: CT ANGIOGRAPHY HEAD AND NECK TECHNIQUE: Multidetector CT imaging of the head and neck was performed using the standard protocol during bolus administration of intravenous contrast. Multiplanar CT image reconstructions and MIPs were obtained to  evaluate the vascular anatomy. Carotid stenosis measurements (when applicable) are obtained utilizing NASCET criteria, using the distal internal carotid diameter as the denominator. CONTRAST:  75 cc of Isovue 370. COMPARISON:  Prior CT from earlier the same day. FINDINGS: CT HEAD FINDINGS Brain: Stable atrophy with chronic microvascular ischemic disease. Remote left occipital and cerebellar infarcts. No acute intracranial hemorrhage. No acute large vessel territory infarct. No mass lesion, midline shift or mass effect. No hydrocephalus. No extra-axial fluid collection. Vascular: No hyperdense vessel. Skull: Scalp soft tissues and calvarium within normal limits. Sinuses: Visualized paranasal sinuses and mastoid  air cells are clear. Orbits: Visualized globes and orbital soft tissues within normal limits. Review of the MIP images confirms the above findings CTA NECK FINDINGS Aortic arch: Visualized aortic arch of normal caliber with normal branch pattern. Scattered atheromatous plaque within the arch and about the origin of the great vessels without flow-limiting stenosis. Partially visualized subclavian artery is widely patent. Right carotid system: Right common carotid artery patent from its origin to the bifurcation without stenosis. Eccentric calcified plaque about the right carotid bifurcation without flow-limiting stenosis. Right ICA patent distally to the skullbase without stenosis, dissection, or occlusion. Left carotid system: Left common carotid artery patent from its origin to the bifurcation without stenosis. Scattered calcified plaque about the left carotid bifurcation without flow-limiting stenosis. Left ICA patent distally to the skullbase without stenosis, dissection, or occlusion. Vertebral arteries: Both of the vertebral arteries arise from the subclavian arteries. Scattered atheromatous irregularity within the mid to lower without flow-limiting stenosis. Vertebral arteries patent within the neck  without dissection or occlusion. Skeleton: No acute osseous abnormality. No worrisome lytic or blastic osseous lesions. Sequelae of prior posterior decompression with fusion noted within cervical spine. Moderate multilevel degenerative spondylolysis, greatest at C4-5 through C6-7. Other neck: No acute soft tissue abnormality within the neck. Salivary glands within normal limits. Thyroid normal. No adenopathy. Upper chest: Visualized upper mediastinum within normal limits. Scattered irregular biapical pleuroparenchymal scarring with bronchiectasis noted. Associated biapical fibrotic lung changes. Review of the MIP images confirms the above findings CTA HEAD FINDINGS Anterior circulation: Petrous segments widely patent bilaterally. Scattered atheromatous plaque within the cavernous/ supraclinoid ICAs without flow-limiting stenosis. ICA termini widely patent. A1 segments patent bilaterally. Anterior communicating artery normal. Anterior cerebral arteries demonstrate scattered atheromatous irregularity but are patent to their distal aspects without flow-limiting stenosis. M1 segments patent without stenosis or occlusion. MCA bifurcations normal. No proximal M2 occlusion. Distal MCA branches well opacified and symmetric. Distal small vessel atheromatous irregularity noted. Posterior circulation: Focal plaque noted at the dominant left vertebral artery as it crosses the dural margin with associated mild stenosis. Vertebral artery's patent to the vertebrobasilar junction without flow-limiting stenosis. Right PICA patent. Left PICA not visualized. Basilar artery tortuous but widely patent to its distal aspect. Superior cerebral arteries patent bilaterally. Hypoplastic right P1 segment with prominent right posterior communicating artery. Fetal type origin of the left PCA supplied via the left posterior communicating artery. PCAs patent to their distal aspects without flow-limiting stenosis. Venous sinuses: Patent. Anatomic  variants: Fetal type origin of left PCA. No aneurysm or vascular malformation. Delayed phase: Not performed. Review of the MIP images confirms the above findings IMPRESSION: 1. Negative CTA for emergent large vessel occlusion. 2. Moderate atherosclerotic disease involving the major arterial vasculature of the head and neck as above, most evident about the aortic arch and origin of the great vessels. No high-grade or flow-limiting stenosis identified. 3. Stable appearance of the brain. No acute intracranial hemorrhage status post tPA administration. Electronically Signed   By: Jeannine Boga M.D.   On: 08/09/2017 22:56   12-lead ECG sinus rhythm (personally reviewed) All prior EKG's in EPIC reviewed with no documented atrial fibrillation  Assessment and Plan:  1. Cryptogenic stroke The patient presents with cryptogenic stroke.  TEE demonstrated no cause for stroke.  I spoke at length with the patient about monitoring for afib with an implantable loop recorder.  Risks, benefits, and alteratives to implantable loop recorder were discussed with the patient today.   At this time, the patient is very  clear in their decision to proceed with implantable loop recorder.   Wound care was reviewed with the patient (keep incision clean and dry for 3 days).  Wound check scheduled and entered in AVS.  Please call with questions.   Chanetta Marshall, NP 08/23/2017 7:19 AM  I have seen, examined the patient, and reviewed the above assessment and plan.  On exam, RRR.  Changes to above are made where necessary.  Risks and benefits to long term monitoring discussed at length with the patient today.  She understands and wishes to proceed at this time.  Co Sign: Thompson Grayer, MD 08/23/2017 7:39 AM

## 2017-08-23 NOTE — Interval H&P Note (Signed)
History and Physical Interval Note:  08/23/2017 7:40 AM  Melody Ochoa  has presented today for surgery, with the diagnosis of stroke  The various methods of treatment have been discussed with the patient and family. After consideration of risks, benefits and other options for treatment, the patient has consented to  Procedure(s): LOOP RECORDER INSERTION (N/A) as a surgical intervention .  The patient's history has been reviewed, patient examined, no change in status, stable for surgery.  I have reviewed the patient's chart and labs.  Questions were answered to the patient's satisfaction.     Thompson Grayer

## 2017-08-23 NOTE — Consult Note (Signed)
ELECTROPHYSIOLOGY CONSULT NOTE  Patient ID: Melody Ochoa MRN: [id number], DOB/AGE: 1941-01-18   Date of Consult: 08/23/2017  Primary Physician: Kirk Ruths, MD Primary Cardiologist: Paraschos  Reason for Consultation: Cryptogenic stroke; recommendations regarding Implantable Loop Recorder (requested by Dr Erlinda Hong).  History of Present Illness EP has been asked to evaluate Melody Ochoa for placement of an implantable loop recorder to monitor for atrial fibrillation by Dr Erlinda Hong.  The patient was admitted recently with acute onset dizziness and was found to have a small cerebellar or brainstem stroke. She has undergone workup for stroke including echocardiogram and carotid dopplers.  The patient was monitored on telemetry which has demonstrated sinus rhythm with no arrhythmias.  TEE demonstrated no cause for stroke.   Echocardiogram demonstrated EF 50-55%, mild LVH, grade 1 diastolic dysfunction, LA 44.   Prior to admission, the patient denies chest pain, shortness of breath, dizziness, palpitations, or syncope.    Past Medical History:  Diagnosis Date  . Arthritis   . Back pain   . Cancer ([place])    skin  . Depression   . Diverticulosis   . Dizziness    patient had episode of dizziness when came in room. hx past no dx  . GERD (gastroesophageal reflux disease)   . Heart murmur   . PONV (postoperative nausea and vomiting)    yrs ago  . Stroke [facility])      Surgical History:  Past Surgical History:  Procedure Laterality Date  . BACK SURGERY  [place] SURGERY  09  . COLONOSCOPY WITH PROPOFOL N/A 01/13/2016   Procedure: COLONOSCOPY WITH PROPOFOL;  Surgeon: [name], MD;  Location: [facility] ENDOSCOPY;  Service: Endoscopy;  Laterality: N/A;  . FACELIFT  94  . TEE WITHOUT CARDIOVERSION N/A 08/12/2017   Procedure: TRANSESOPHAGEAL ECHOCARDIOGRAM (TEE);  Surgeon: Sueanne Margarita, MD;  Location: [facility] ENDOSCOPY;  Service: Cardiovascular;  Laterality: N/A;  . TONSILLECTOMY      . TOTAL HIP ARTHROPLASTY Right 04/22/2016   Procedure: TOTAL HIP ARTHROPLASTY;  Surgeon: Corky Mull, MD;  Location: [facility] ORS;  Service: Orthopedics;  Laterality: Right;     Prescriptions Prior to Admission  Medication Sig Dispense Refill Last Dose  . atorvastatin (LIPITOR) 40 MG tablet Take 1 tablet (40 mg total) by mouth daily at 6 PM. 30 tablet 0   . buPROPion (WELLBUTRIN XL) 300 MG 24 hr tablet Take 300 mg by mouth daily.    08/09/2017 at Unknown time  . Calcium Carbonate-Vitamin D (CALTRATE 600+D PO) Take 1 tablet by mouth daily.   08/09/2017 at Unknown time  . clopidogrel (PLAVIX) 75 MG tablet Take 1 tablet (75 mg total) by mouth daily. 30 tablet 0   . Cyanocobalamin (VITAMIN B-12 PO) Take 1 tablet by mouth daily.   08/09/2017 at Unknown time  . DULoxetine (CYMBALTA) 60 MG capsule Take 120 mg by mouth daily.    08/09/2017 at Unknown time  . ferrous sulfate 325 (65 FE) MG tablet Take 1 tablet (325 mg total) by mouth daily. 30 tablet 0   . gabapentin (NEURONTIN) 300 MG capsule Take 300 mg by mouth at bedtime.   3 Past Week at Unknown time  . vitamin C (ASCORBIC ACID) 500 MG tablet Take 500 mg by mouth daily.   08/09/2017 at Unknown time    Inpatient Medications:   Allergies:  Allergies  Allergen Reactions  . Aleve [Naproxen Sodium] Other (See Comments)    Gi upset  . Aspirin  Other (See Comments)    Stomach pain-aggravates diverticulosis  . Celebrex [Celecoxib] Other (See Comments)    Dizziness   . Effexor [Venlafaxine] Other (See Comments)    Hot flashes   . Hydrocodone-Homatropine Diarrhea  . Ibuprofen Other (See Comments)    Gi upset   . Tape Itching and Other (See Comments)    Itchy blisters  . Vioxx [Rofecoxib] Other (See Comments)    Gi upset  . Other Rash and Other (See Comments)    bandaides  . Tramadol Other (See Comments)    hallucinations    Social History   Social History  . Marital status: Married    Spouse name: N/A  . Number of children: N/A  . Years of  education: N/A   Occupational History  . Not on file.   Social History Main Topics  . Smoking status: Former Smoker    Packs/day: 1.00    Years: 40.00    Types: Cigarettes    Quit date: 11/13/1996  . Smokeless tobacco: Never Used  . Alcohol use Yes     Comment: occ wine  . Drug use: No  . Sexual activity: Not on file   Other Topics Concern  . Not on file   Social History Narrative  . No narrative on file     Family History: no premature CAD     Review of Systems: All other systems reviewed and are otherwise negative except as noted above.  Physical Exam: Vitals:   08/23/17 0716  BP: (!) 153/75  Pulse: 87  Temp: 98.5 F (36.9 C)  TempSrc: Oral  SpO2: 98%  Weight: 190 lb (86.2 kg)  Height: 5\' 4"  (1.626 m)    GEN- The patient is elderly appearing, alert and oriented x 3 today.   Head- normocephalic, atraumatic Eyes-  Sclera clear, conjunctiva pink Ears- hearing intact Oropharynx- clear Neck- supple Lungs- Clear to ausculation bilaterally, normal work of breathing Heart- Regular rate and rhythm  GI- soft, NT, ND, + BS Extremities- no clubbing, cyanosis, or edema MS- no significant deformity or atrophy Skin- no rash or lesion Psych- euthymic mood, full affect   Labs:   Lab Results  Component Value Date   WBC 5.8 08/19/2017   HGB 11.2 (L) 08/19/2017   HCT 32.9 (L) 08/19/2017   MCV 85.8 08/19/2017   PLT 318 08/19/2017    Recent Labs Lab 08/19/17 1852  NA 140  K 3.7  CL 104  CO2 28  BUN 10  CREATININE 1.01*  CALCIUM 9.3  GLUCOSE 81     Radiology/Studies: Ct Angio Head W Or Wo Contrast Result Date: 08/09/2017 CLINICAL DATA:  Initial evaluation for acute dizziness, left upper extremity weakness. EXAM: CT ANGIOGRAPHY HEAD AND NECK TECHNIQUE: Multidetector CT imaging of the head and neck was performed using the standard protocol during bolus administration of intravenous contrast. Multiplanar CT image reconstructions and MIPs were obtained to  evaluate the vascular anatomy. Carotid stenosis measurements (when applicable) are obtained utilizing NASCET criteria, using the distal internal carotid diameter as the denominator. CONTRAST:  75 cc of Isovue 370. COMPARISON:  Prior CT from earlier the same day. FINDINGS: CT HEAD FINDINGS Brain: Stable atrophy with chronic microvascular ischemic disease. Remote left occipital and cerebellar infarcts. No acute intracranial hemorrhage. No acute large vessel territory infarct. No mass lesion, midline shift or mass effect. No hydrocephalus. No extra-axial fluid collection. Vascular: No hyperdense vessel. Skull: Scalp soft tissues and calvarium within normal limits. Sinuses: Visualized paranasal sinuses and mastoid  air cells are clear. Orbits: Visualized globes and orbital soft tissues within normal limits. Review of the MIP images confirms the above findings CTA NECK FINDINGS Aortic arch: Visualized aortic arch of normal caliber with normal branch pattern. Scattered atheromatous plaque within the arch and about the origin of the great vessels without flow-limiting stenosis. Partially visualized subclavian artery is widely patent. Right carotid system: Right common carotid artery patent from its origin to the bifurcation without stenosis. Eccentric calcified plaque about the right carotid bifurcation without flow-limiting stenosis. Right ICA patent distally to the skullbase without stenosis, dissection, or occlusion. Left carotid system: Left common carotid artery patent from its origin to the bifurcation without stenosis. Scattered calcified plaque about the left carotid bifurcation without flow-limiting stenosis. Left ICA patent distally to the skullbase without stenosis, dissection, or occlusion. Vertebral arteries: Both of the vertebral arteries arise from the subclavian arteries. Scattered atheromatous irregularity within the mid to lower without flow-limiting stenosis. Vertebral arteries patent within the neck  without dissection or occlusion. Skeleton: No acute osseous abnormality. No worrisome lytic or blastic osseous lesions. Sequelae of prior posterior decompression with fusion noted within cervical spine. Moderate multilevel degenerative spondylolysis, greatest at C4-5 through C6-7. Other neck: No acute soft tissue abnormality within the neck. Salivary glands within normal limits. Thyroid normal. No adenopathy. Upper chest: Visualized upper mediastinum within normal limits. Scattered irregular biapical pleuroparenchymal scarring with bronchiectasis noted. Associated biapical fibrotic lung changes. Review of the MIP images confirms the above findings CTA HEAD FINDINGS Anterior circulation: Petrous segments widely patent bilaterally. Scattered atheromatous plaque within the cavernous/ supraclinoid ICAs without flow-limiting stenosis. ICA termini widely patent. A1 segments patent bilaterally. Anterior communicating artery normal. Anterior cerebral arteries demonstrate scattered atheromatous irregularity but are patent to their distal aspects without flow-limiting stenosis. M1 segments patent without stenosis or occlusion. MCA bifurcations normal. No proximal M2 occlusion. Distal MCA branches well opacified and symmetric. Distal small vessel atheromatous irregularity noted. Posterior circulation: Focal plaque noted at the dominant left vertebral artery as it crosses the dural margin with associated mild stenosis. Vertebral artery's patent to the vertebrobasilar junction without flow-limiting stenosis. Right PICA patent. Left PICA not visualized. Basilar artery tortuous but widely patent to its distal aspect. Superior cerebral arteries patent bilaterally. Hypoplastic right P1 segment with prominent right posterior communicating artery. Fetal type origin of the left PCA supplied via the left posterior communicating artery. PCAs patent to their distal aspects without flow-limiting stenosis. Venous sinuses: Patent. Anatomic  variants: Fetal type origin of left PCA. No aneurysm or vascular malformation. Delayed phase: Not performed. Review of the MIP images confirms the above findings IMPRESSION: 1. Negative CTA for emergent large vessel occlusion. 2. Moderate atherosclerotic disease involving the major arterial vasculature of the head and neck as above, most evident about the aortic arch and origin of the great vessels. No high-grade or flow-limiting stenosis identified. 3. Stable appearance of the brain. No acute intracranial hemorrhage status post tPA administration. Electronically Signed   By: Jeannine Boga M.D.   On: 08/09/2017 22:56   12-lead ECG sinus rhythm (personally reviewed) All prior EKG's in EPIC reviewed with no documented atrial fibrillation  Assessment and Plan:  1. Cryptogenic stroke The patient presents with cryptogenic stroke.  TEE demonstrated no cause for stroke.  I spoke at length with the patient about monitoring for afib with an implantable loop recorder.  Risks, benefits, and alteratives to implantable loop recorder were discussed with the patient today.   At this time, the patient is very  clear in their decision to proceed with implantable loop recorder.   Wound care was reviewed with the patient (keep incision clean and dry for 3 days).  Wound check scheduled and entered in AVS.  Please call with questions.   Chanetta Marshall, NP 08/23/2017 7:19 AM  I have seen, examined the patient, and reviewed the above assessment and plan.  On exam, RRR.  Changes to above are made where necessary.  Risks and benefits to long term monitoring discussed at length with the patient today.  She understands and wishes to proceed at this time.  Co Sign: Thompson Grayer, MD 08/23/2017 7:39 AM

## 2017-08-24 ENCOUNTER — Encounter: Payer: Self-pay | Admitting: Neurology

## 2017-08-25 DIAGNOSIS — M6281 Muscle weakness (generalized): Secondary | ICD-10-CM | POA: Diagnosis not present

## 2017-08-25 DIAGNOSIS — I69393 Ataxia following cerebral infarction: Secondary | ICD-10-CM | POA: Diagnosis not present

## 2017-08-25 DIAGNOSIS — F329 Major depressive disorder, single episode, unspecified: Secondary | ICD-10-CM | POA: Diagnosis not present

## 2017-08-25 DIAGNOSIS — M4316 Spondylolisthesis, lumbar region: Secondary | ICD-10-CM | POA: Diagnosis not present

## 2017-08-25 DIAGNOSIS — M1990 Unspecified osteoarthritis, unspecified site: Secondary | ICD-10-CM | POA: Diagnosis not present

## 2017-08-25 DIAGNOSIS — D649 Anemia, unspecified: Secondary | ICD-10-CM | POA: Diagnosis not present

## 2017-08-29 ENCOUNTER — Other Ambulatory Visit: Payer: Self-pay

## 2017-08-29 DIAGNOSIS — M6281 Muscle weakness (generalized): Secondary | ICD-10-CM | POA: Diagnosis not present

## 2017-08-29 DIAGNOSIS — M1990 Unspecified osteoarthritis, unspecified site: Secondary | ICD-10-CM | POA: Diagnosis not present

## 2017-08-29 DIAGNOSIS — M4316 Spondylolisthesis, lumbar region: Secondary | ICD-10-CM | POA: Diagnosis not present

## 2017-08-29 DIAGNOSIS — I69393 Ataxia following cerebral infarction: Secondary | ICD-10-CM | POA: Diagnosis not present

## 2017-08-29 DIAGNOSIS — F329 Major depressive disorder, single episode, unspecified: Secondary | ICD-10-CM | POA: Diagnosis not present

## 2017-08-29 DIAGNOSIS — D649 Anemia, unspecified: Secondary | ICD-10-CM | POA: Diagnosis not present

## 2017-08-29 NOTE — Patient Outreach (Signed)
[place] [facility]) Care Management  08/29/2017  Melody Ochoa 03-15-1941 [id number]  EMMI: stroke Referral date: 08/29/17 Referral source: EMMI stroke red alert Referral reason: Questions/ problems with meds: [name] to follow up appointment: NO Day # 13   Telephone call to patient regarding EMMI red alert.  HIPAA verified with patient. Discussed EMMI stroke program with patient. Patient states she is having a hard time remembering to take her 6pm medication. Patient states she is not use to having to take a lot of medication. Patient states since having the stroke she was started on 3 new medications. Patient states she understands her medicines.  Patient states she has a loop recorder. Patient states she has a lot of questions concerning her loop recorder.  Patient states she is unable to complete this call with RNCM due to her home physical therapist being there. Patient request RNCM call her back to complete call.   ASSESSMENT;  Per patients medical record 76 y.o.femalewith history ofhistory of dizziness and heart murmur   Date of Admission: 08/10/2017 Date of Discharge: 08/12/2017  Attending Physician:  Garvin Fila, MD, Stroke MD Patient's PCP:  Kirk Ruths, MD  DISCHARGE DIAGNOSIS: Principal Problem:   Stroke (cerebrum) ([place]) - Patchy small volume acute ischemic nonhemorrhagic superior left cerebellar  Embolic infarct, left SCA territory, of cryptogenic etiology, with diffuse intracranial atherosclerosis  PLAN:  RNCM will follow up with patient within 3 business days to complete outreach call.   Quinn Plowman RN,BSN,CCM [facility] Telephonic  [phone]

## 2017-08-30 ENCOUNTER — Other Ambulatory Visit: Payer: Self-pay

## 2017-08-30 NOTE — Patient Outreach (Signed)
[place] [facility]) Care Management  08/30/2017  Melody Ochoa 1941-01-20 [id number]   EMMI: stroke Referral date: 08/29/17 Referral source: EMMI stroke red alert Referral reason: Questions/ problems with meds: [name] to follow up appointment: NO Day # 13  Telephone call to patient regarding EMMI stroke red alert follow up. HIPAA verified by patient. Patient states she does have some questions related to her loop recorder. RNCM advised patient she could ask specific questions regarding her loop recorder at the wound check clinic.   RNCM confirmed with patient she has a wound check appointment scheduled at the [facility] cardiac device clinic on Thursday 09/01/17.  RNCM discussed with patient signs/ symptoms of infection. Patient states she is not having any symptoms at her loop recording wound site. Patient unsure where to go for her appointment regarding her loop recorder. RNCM contacted [facility] cardiology office and spoke with [place] who confirmed patients appointment and gave address and contact phone number of appointment site. RNCM called patient to inform her of the contact information for her 09/01/17 appointment.  Patient expressed her appreciation for the information. Patient confirms she has appointments scheduled with Dr. Leonie Man and [name], FNP.  Patient denies any further needs at this time.   PLAN; RNCM will refer patient to care management assistant to close due to patient being assessed and having no further needs.  Quinn Plowman RN,BSN,CCM [facility] Telephonic  [phone]

## 2017-09-01 ENCOUNTER — Ambulatory Visit (INDEPENDENT_AMBULATORY_CARE_PROVIDER_SITE_OTHER): Payer: Self-pay | Admitting: *Deleted

## 2017-09-01 DIAGNOSIS — I69393 Ataxia following cerebral infarction: Secondary | ICD-10-CM | POA: Diagnosis not present

## 2017-09-01 DIAGNOSIS — M4316 Spondylolisthesis, lumbar region: Secondary | ICD-10-CM | POA: Diagnosis not present

## 2017-09-01 DIAGNOSIS — F329 Major depressive disorder, single episode, unspecified: Secondary | ICD-10-CM | POA: Diagnosis not present

## 2017-09-01 DIAGNOSIS — D649 Anemia, unspecified: Secondary | ICD-10-CM | POA: Diagnosis not present

## 2017-09-01 DIAGNOSIS — M6281 Muscle weakness (generalized): Secondary | ICD-10-CM | POA: Diagnosis not present

## 2017-09-01 DIAGNOSIS — I639 Cerebral infarction, unspecified: Secondary | ICD-10-CM

## 2017-09-01 DIAGNOSIS — M1990 Unspecified osteoarthritis, unspecified site: Secondary | ICD-10-CM | POA: Diagnosis not present

## 2017-09-02 LAB — CUP PACEART INCLINIC DEVICE CHECK
Date Time Interrogation Session: 20180927195709
Implantable Pulse Generator Implant Date: 20180918

## 2017-09-02 NOTE — Progress Notes (Signed)
Wound check appointment. Steri-strips removed by patient. Wound without redness or edema. Incision edges approximated, wound well healed. Loop check in clinic. Battery status: good. R-waves 0.93mV. 0 symptom episodes, 0 tachy episodes, 0 pause episodes, 0 brady episodes. 0 AF episodes . Monthly summary reports and ROV in OV PRN

## 2017-09-05 DIAGNOSIS — M1990 Unspecified osteoarthritis, unspecified site: Secondary | ICD-10-CM | POA: Diagnosis not present

## 2017-09-05 DIAGNOSIS — I69393 Ataxia following cerebral infarction: Secondary | ICD-10-CM | POA: Diagnosis not present

## 2017-09-05 DIAGNOSIS — M6281 Muscle weakness (generalized): Secondary | ICD-10-CM | POA: Diagnosis not present

## 2017-09-05 DIAGNOSIS — M4316 Spondylolisthesis, lumbar region: Secondary | ICD-10-CM | POA: Diagnosis not present

## 2017-09-05 DIAGNOSIS — D649 Anemia, unspecified: Secondary | ICD-10-CM | POA: Diagnosis not present

## 2017-09-05 DIAGNOSIS — F329 Major depressive disorder, single episode, unspecified: Secondary | ICD-10-CM | POA: Diagnosis not present

## 2017-09-12 DIAGNOSIS — Z23 Encounter for immunization: Secondary | ICD-10-CM | POA: Diagnosis not present

## 2017-09-12 DIAGNOSIS — Z8673 Personal history of transient ischemic attack (TIA), and cerebral infarction without residual deficits: Secondary | ICD-10-CM | POA: Insufficient documentation

## 2017-09-15 DIAGNOSIS — F329 Major depressive disorder, single episode, unspecified: Secondary | ICD-10-CM | POA: Diagnosis not present

## 2017-09-15 DIAGNOSIS — M4316 Spondylolisthesis, lumbar region: Secondary | ICD-10-CM | POA: Diagnosis not present

## 2017-09-15 DIAGNOSIS — I69393 Ataxia following cerebral infarction: Secondary | ICD-10-CM | POA: Diagnosis not present

## 2017-09-15 DIAGNOSIS — D649 Anemia, unspecified: Secondary | ICD-10-CM | POA: Diagnosis not present

## 2017-09-15 DIAGNOSIS — M6281 Muscle weakness (generalized): Secondary | ICD-10-CM | POA: Diagnosis not present

## 2017-09-15 DIAGNOSIS — M1990 Unspecified osteoarthritis, unspecified site: Secondary | ICD-10-CM | POA: Diagnosis not present

## 2017-09-22 ENCOUNTER — Ambulatory Visit (INDEPENDENT_AMBULATORY_CARE_PROVIDER_SITE_OTHER): Payer: Medicare Other | Admitting: *Deleted

## 2017-09-22 DIAGNOSIS — I639 Cerebral infarction, unspecified: Secondary | ICD-10-CM | POA: Diagnosis not present

## 2017-09-22 NOTE — Progress Notes (Signed)
Carelink Summary Report / loop Recorder  

## 2017-09-23 LAB — CUP PACEART REMOTE DEVICE CHECK
Date Time Interrogation Session: 20181018120652
Implantable Pulse Generator Implant Date: 20180918

## 2017-09-26 ENCOUNTER — Encounter: Payer: Self-pay | Admitting: Neurology

## 2017-09-26 ENCOUNTER — Ambulatory Visit (INDEPENDENT_AMBULATORY_CARE_PROVIDER_SITE_OTHER): Payer: Medicare Other | Admitting: Neurology

## 2017-09-26 VITALS — BP 125/76 | HR 78 | Wt 195.4 lb

## 2017-09-26 DIAGNOSIS — I639 Cerebral infarction, unspecified: Secondary | ICD-10-CM

## 2017-09-26 NOTE — Patient Instructions (Signed)
I had a long d/w patient about her  recent cryptogenic stroke, risk for recurrent stroke/TIAs, personally independently reviewed imaging studies and stroke evaluation results and answered questions.Continue Plavix  for secondary stroke prevention and maintain strict control of hypertension with blood pressure goal below 130/90, diabetes with hemoglobin A1c goal below 6.5% and lipids with LDL cholesterol goal below 70 mg/dL. I also advised the patient to eat a healthy diet with plenty of whole grains, cereals, fruits and vegetables, exercise regularly and maintain ideal body weight.I advise her to use a cane at all times and we discussed fall and safety prevention precautions. Consider possible participation in the [facility]MIERS stroke prevention trial if interested.Followup in the future with my nurse practitioner in 6 months or call earlier if necessary  Fall Prevention in the [place] can cause injuries and can affect people from all age groups. There are many simple things that you can do to make your home safe and to help prevent falls. What can I do on the outside of my home?  Regularly repair the edges of walkways and driveways and fix any cracks.  Remove high doorway thresholds.  Trim any shrubbery on the main path into your home.  Use bright outdoor lighting.  Clear walkways of debris and clutter, including tools and rocks.  Regularly check that handrails are securely fastened and in good repair. Both sides of any steps should have handrails.  Install guardrails along the edges of any raised decks or porches.  Have leaves, snow, and ice cleared regularly.  Use sand or salt on walkways during winter months.  In the garage, clean up any spills right away, including grease or oil spills. What can I do in the bathroom?  Use night lights.  Install grab bars by the toilet and in the tub and shower. Do not use towel bars as grab bars.  Use non-skid mats or decals on the floor of the tub  or shower.  If you need to sit down while you are in the shower, use a plastic, non-slip stool.  Keep the floor dry. Immediately clean up any water that spills on the floor.  Remove soap buildup in the tub or shower on a regular basis.  Attach bath mats securely with double-sided non-slip rug tape.  Remove throw rugs and other tripping hazards from the floor. What can I do in the bedroom?  Use night lights.  Make sure that a bedside light is easy to reach.  Do not use oversized bedding that drapes onto the floor.  Have a firm chair that has side arms to use for getting dressed.  Remove throw rugs and other tripping hazards from the floor. What can I do in the kitchen?  Clean up any spills right away.  Avoid walking on wet floors.  Place frequently used items in easy-to-reach places.  If you need to reach for something above you, use a sturdy step stool that has a grab bar.  Keep electrical cables out of the way.  Do not use floor polish or wax that makes floors slippery. If you have to use wax, make sure that it is non-skid floor wax.  Remove throw rugs and other tripping hazards from the floor. What can I do in the stairways?  Do not leave any items on the stairs.  Make sure that there are handrails on both sides of the stairs. Fix handrails that are broken or loose. Make sure that handrails are as long as the stairways.  Check any carpeting to make sure that it is firmly attached to the stairs. Fix any carpet that is loose or worn.  Avoid having throw rugs at the top or bottom of stairways, or secure the rugs with carpet tape to prevent them from moving.  Make sure that you have a light switch at the top of the stairs and the bottom of the stairs. If you do not have them, have them installed. What are some other fall prevention tips?  Wear closed-toe shoes that fit well and support your feet. Wear shoes that have rubber soles or low heels.  When you use a  stepladder, make sure that it is completely opened and that the sides are firmly locked. Have someone hold the ladder while you are using it. Do not climb a closed stepladder.  Add color or contrast paint or tape to grab bars and handrails in your home. Place contrasting color strips on the first and last steps.  Use mobility aids as needed, such as canes, walkers, scooters, and crutches.  Turn on lights if it is dark. Replace any light bulbs that burn out.  Set up furniture so that there are clear paths. Keep the furniture in the same spot.  Fix any uneven floor surfaces.  Choose a carpet design that does not hide the edge of steps of a stairway.  Be aware of any and all pets.  Review your medicines with your healthcare provider. Some medicines can cause dizziness or changes in blood pressure, which increase your risk of falling. Talk with your health care provider about other ways that you can decrease your risk of falls. This may include working with a physical therapist or trainer to improve your strength, balance, and endurance. This information is not intended to replace advice given to you by your health care provider. Make sure you discuss any questions you have with your health care provider. Document Released: 11/12/2002 Document Revised: 04/20/2016 Document Reviewed: 12/27/2014 Elsevier Interactive Patient Education  2017 [employer]. Stroke Prevention Some medical conditions and behaviors are associated with an increased chance of having a stroke. You may prevent a stroke by making healthy choices and managing medical conditions. How can I reduce my risk of having a stroke?  Stay physically active. Get at least 30 minutes of activity on most or all days.  Do not smoke. It may also be helpful to avoid exposure to secondhand smoke.  Limit alcohol use. Moderate alcohol use is considered to be: ? No more than 2 drinks per day for men. ? No more than 1 drink per day for  nonpregnant women.  Eat healthy foods. This involves: ? Eating 5 or more servings of fruits and vegetables a day. ? Making dietary changes that address high blood pressure (hypertension), high cholesterol, diabetes, or obesity.  Manage your cholesterol levels. ? Making food choices that are high in fiber and low in saturated fat, trans fat, and cholesterol may control cholesterol levels. ? Take any prescribed medicines to control cholesterol as directed by your health care provider.  Manage your diabetes. ? Controlling your carbohydrate and sugar intake is recommended to manage diabetes. ? Take any prescribed medicines to control diabetes as directed by your health care provider.  Control your hypertension. ? Making food choices that are low in salt (sodium), saturated fat, trans fat, and cholesterol is recommended to manage hypertension. ? Ask your health care provider if you need treatment to lower your blood pressure. Take any prescribed medicines to  control hypertension as directed by your health care provider. ? If you are 72-47 years of age, have your blood pressure checked every 3-5 years. If you are 25 years of age or older, have your blood pressure checked every year.  Maintain a healthy weight. ? Reducing calorie intake and making food choices that are low in sodium, saturated fat, trans fat, and cholesterol are recommended to manage weight.  Stop drug abuse.  Avoid taking birth control pills. ? Talk to your health care provider about the risks of taking birth control pills if you are over 42 years old, smoke, get migraines, or have ever had a blood clot.  Get evaluated for sleep disorders (sleep apnea). ? Talk to your health care provider about getting a sleep evaluation if you snore a lot or have excessive sleepiness.  Take medicines only as directed by your health care provider. ? For some people, aspirin or blood thinners (anticoagulants) are helpful in reducing the risk  of forming abnormal blood clots that can lead to stroke. If you have the irregular heart rhythm of atrial fibrillation, you should be on a blood thinner unless there is a good reason you cannot take them. ? Understand all your medicine instructions.  Make sure that other conditions (such as anemia or atherosclerosis) are addressed. Get help right away if:  You have sudden weakness or numbness of the face, arm, or leg, especially on one side of the body.  Your face or eyelid droops to one side.  You have sudden confusion.  You have trouble speaking (aphasia) or understanding.  You have sudden trouble seeing in one or both eyes.  You have sudden trouble walking.  You have dizziness.  You have a loss of balance or coordination.  You have a sudden, severe headache with no known cause.  You have new chest pain or an irregular heartbeat. Any of these symptoms may represent a serious problem that is an emergency. Do not wait to see if the symptoms will go away. Get medical help at once. Call your local emergency services (911 in [place] Do not drive yourself to the hospital. This information is not intended to replace advice given to you by your health care provider. Make sure you discuss any questions you have with your health care provider. Document Released: 12/30/2004 Document Revised: 04/29/2016 Document Reviewed: 05/25/2013 Elsevier Interactive Patient Education  2017 [employer].

## 2017-09-26 NOTE — Progress Notes (Signed)
[facility] [address] [place]. [place] [address] [phone]       OFFICE FOLLOW-UP NOTE  Melody. MADDEN Ochoa Date of Birth:  [birth year] Medical Record Number:  [id number]   HPI: Melody Ramser is 76 year Caucasian lady seen today for the first office follow-up visit following hospital admission for stroke in September 2018.History is up 10 from the patient, review of electronic medical records and have personally reviewed imaging films.Marlyn Tondreau McCauleyis a 76 y.o.femalewho presented to [facility] 08/[facility]17 with acute onset dizziness started around 6:15 to 6:30 PM. She was evaluated by teleneurology and felt to be having a small cerebellar or brainstem stroke. She was treated with IV TPA and transferred to [facility] has had markedimprovement in the interim LKW: 6:30 PM 08/09/17 tpa given?: Yes at [facility] NIHSS: 1 After arrival to [facility] her dizziness and balance a lot improved. She denies any headache. She still felt off balance and has some coordination difficulties. She had an MRI scan the brain done which I personally reviewed shows small patchy left superior cerebellar infarct. There is also evidence of old left cerebellar as well as old left high parietal cortical infarct. She denies any prior history of strokes or any neurological workup. She states she did have a similar episode of dizziness in the past and was seen in the emergency room and was sent home and did not get a complete stroke workup.MRI scan of the brain showed patchy small volume acuteischemic nonhemorrhagic left superior cerebellar artery infarct. CT angiogram of the head and brain showed no large vessel occlusion or high-grade stenosis. There is diffuse moderate at [facility] exchanges of the aortic arch. Transthoracic echo showed normal ejection fraction.LDL cholesterol elevated at 126 mg percent and hemoglobin A1c was 5.3.TEE showed low normal left ventricular ejection fraction and some lipomatous hypertrophy of the  interatrial septum and small aneurysm but no definite cardiac source of embolism. There was a moderate to large focal bulky plaque of the aortic arch. She had outpatient loop recorder inserted and so for paroxysmal atrial fibrillation has not yet been found. She states she has finished home physical and occupation therapy. She has noticed some improvement but yet feels dizzy particularly when she makes a sudden turn and tries to sit down quickly. She remains on Plavix which is tolerating well without bleeding but does get bruised easily. She is tolerating Lipitor well without muscle aches and pains. She's had no falls or injuries but does use a cane now consistently all the time. She denies any recurrent stroke or TIA symptoms.   ROS:   14 system review of systems is positive for  Fatigue, shortness of breath, snoring, spinning sensation, easy bruising and bleeding, feeling hot, aching muscles, allergies, skin sensitivity, memory loss, weakness, dizziness, depression and all other systems negative  PMH:  Past Medical History:  Diagnosis Date  . Arthritis   . Back pain   . Cancer ([place])    skin  . Depression   . Diverticulosis   . Dizziness    patient had episode of dizziness when came in room. hx past no dx  . GERD (gastroesophageal reflux disease)   . Heart murmur   . PONV (postoperative nausea and vomiting)    yrs ago  . Stroke [facility])     Social History:  Social History   Social History  . Marital status: Married    Spouse name: N/A  . Number of children: N/A  . Years of education: N/A  Occupational History  . Not on file.   Social History Main Topics  . Smoking status: Former Smoker    Packs/day: 1.00    Years: 40.00    Types: Cigarettes    Quit date: 11/13/1996  . Smokeless tobacco: Never Used  . Alcohol use Yes     Comment: occ wine  . Drug use: No  . Sexual activity: Not on file   Other Topics Concern  . Not on file   Social History Narrative  . No narrative on  file    Medications:   Current Outpatient Prescriptions on File Prior to Visit  Medication Sig Dispense Refill  . atorvastatin (LIPITOR) 40 MG tablet Take 1 tablet (40 mg total) by mouth daily at 6 PM. 30 tablet 0  . buPROPion (WELLBUTRIN XL) 300 MG 24 hr tablet Take 300 mg by mouth daily.     . Calcium Carbonate-Vitamin D (CALTRATE 600+D PO) Take 1 tablet by mouth daily.    . clopidogrel (PLAVIX) 75 MG tablet Take 1 tablet (75 mg total) by mouth daily. 30 tablet 0  . Cyanocobalamin (VITAMIN B-12 PO) Take 1 tablet by mouth daily.    . DULoxetine (CYMBALTA) 60 MG capsule Take 60 mg by mouth daily.     . ferrous sulfate 325 (65 FE) MG tablet Take 1 tablet (325 mg total) by mouth daily. 30 tablet 0  . gabapentin (NEURONTIN) 300 MG capsule Take 300 mg by mouth at bedtime.   3  . vitamin C (ASCORBIC ACID) 500 MG tablet Take 500 mg by mouth daily.     No current facility-administered medications on file prior to visit.     Allergies:   Allergies  Allergen Reactions  . Aleve [Naproxen Sodium] Other (See Comments)    Gi upset  . Aspirin Other (See Comments)    Stomach pain-aggravates diverticulosis  . Celebrex [Celecoxib] Other (See Comments)    Dizziness   . Effexor [Venlafaxine] Other (See Comments)    Hot flashes   . Hydrocodone-Homatropine Diarrhea  . Ibuprofen Other (See Comments)    Gi upset   . Tape Itching and Other (See Comments)    Itchy blisters  . Vioxx [Rofecoxib] Other (See Comments)    Gi upset  . Other Rash and Other (See Comments)    bandaides  . Tramadol Other (See Comments)    hallucinations    Physical Exam General: well developed, well nourished elderly Caucasian lady, seated, in no evident distress Head: head normocephalic and atraumatic.  Neck: supple with no carotid or supraclavicular bruits Cardiovascular: regular rate and rhythm, no murmurs Musculoskeletal: no deformity Skin:  no rash/petichiae Vascular:  Normal pulses all extremities Vitals:    09/26/17 1032  BP: 125/76  Pulse: 78   Neurologic Exam Mental Status: Awake and fully alert. Oriented to place and time. Recent and remote memory intact. Attention span, concentration and fund of knowledge appropriate. Mood and affect appropriate.  Cranial Nerves: Fundoscopic exam reveals sharp disc margins. Pupils equal, briskly reactive to light. Extraocular movements full without nystagmus. Visual fields full to confrontation. Hearing intact. Facial sensation intact. Face, tongue, palate moves normally and symmetrically.  Motor: Normal bulk and tone. Normal strength in all tested extremity muscles. Sensory.: intact to touch ,pinprick .position and vibratory sensation.  Coordination: Rapid alternating movements normal in all extremities. Finger-to-nose and heel-to-shin performed accurately bilaterally. Gait and Station: Arises from chair without difficulty. Stance is broad-based. Uses a cane. Slightly ataxic while turning. Unable to do tandem walking.  Reflexes: 1+ and symmetric. Toes downgoing.   NIHSS  0 Modified Rankin  2   ASSESSMENT: 48 year Caucasian lady with embolic left cerebellar infarct in September 2017 secondary to cryptogenic etiology. Stroke risk factors of hyperlipidemia,prior silent cerebrovascular disease    PLAN: I had a long d/w patient about her  recent cryptogenic stroke, risk for recurrent stroke/TIAs, personally independently reviewed imaging studies and stroke evaluation results and answered questions.Continue Plavix  for secondary stroke prevention and maintain strict control of hypertension with blood pressure goal below 130/90, diabetes with hemoglobin A1c goal below 6.5% and lipids with LDL cholesterol goal below 70 mg/dL. I also advised the patient to eat a healthy diet with plenty of whole grains, cereals, fruits and vegetables, exercise regularly and maintain ideal body weight.I advise her to use a cane at all times and we discussed fall and safety prevention  precautions. Consider possible participation in the [facility] stroke prevention trial if interested.Followup in the future with my nurse practitioner in 6 months or call earlier if necessary Greater than 50% of time during this 25 minute visit was spent on counseling,explanation of diagnosis of cryptogenic stroke, planning of further management, discussion with patient and family and coordination of care Antony Contras, MD  [facility] [address] [place], [place] [address]  Phone [phone] Fax [phone] Note: This document was prepared with digital dictation and possible smart phrase technology. Any transcriptional errors that result from this process are unintentional

## 2017-09-27 ENCOUNTER — Ambulatory Visit (INDEPENDENT_AMBULATORY_CARE_PROVIDER_SITE_OTHER): Payer: Medicare Other | Admitting: Family

## 2017-09-27 ENCOUNTER — Encounter: Payer: Self-pay | Admitting: Family

## 2017-09-27 DIAGNOSIS — I63212 Cerebral infarction due to unspecified occlusion or stenosis of left vertebral arteries: Secondary | ICD-10-CM | POA: Diagnosis not present

## 2017-09-27 DIAGNOSIS — R918 Other nonspecific abnormal finding of lung field: Secondary | ICD-10-CM

## 2017-09-27 DIAGNOSIS — M545 Low back pain, unspecified: Secondary | ICD-10-CM

## 2017-09-27 DIAGNOSIS — G8929 Other chronic pain: Secondary | ICD-10-CM | POA: Diagnosis not present

## 2017-09-27 DIAGNOSIS — F339 Major depressive disorder, recurrent, unspecified: Secondary | ICD-10-CM | POA: Diagnosis not present

## 2017-09-27 DIAGNOSIS — I639 Cerebral infarction, unspecified: Secondary | ICD-10-CM

## 2017-09-27 DIAGNOSIS — R911 Solitary pulmonary nodule: Secondary | ICD-10-CM | POA: Insufficient documentation

## 2017-09-27 NOTE — Progress Notes (Signed)
Pre visit review using our clinic review tool, if applicable. No additional management support is needed unless otherwise documented below in the visit note. 

## 2017-09-27 NOTE — Assessment & Plan Note (Signed)
Doing well on current regimen. Follows with psychiatry.

## 2017-09-27 NOTE — Patient Instructions (Signed)
Pleasure meeting you.  Follow up in 6 months

## 2017-09-27 NOTE — Progress Notes (Signed)
Subjective:    Patient ID: Melody Ochoa, female    DOB: May 18, 1941, 76 y.o.   MRN: [id number]  CC: Melody Ochoa is a 76 y.o. female who presents today to establish care.    HPI: Overall feels well.   Notes balance has not been as good since stroke one month ago. Improvement with PT. Plans to start swimming for strength. No recent falls. Using cane every day. Lives with husband. No HA, dizziness, vision changes, CP.   Was seen by Neurology yesterday, Dr. Leonie Man.  History of stroke 08/2017. Treated with IV TPA. MRI brain - left superior cerebellar infarct, old left cerebellar infarct. TEE showed normal ejection fraction. Negative CTA for large vessel occlusion On plavix, lipitor. NO ASA.   Pulmonary nodule- repeat CT 6 months. Appt with pulmonology tomorrow  Aortic atherosclerosis- on lipitor  Depression- follows with dr Wonda Horner in [place]; on wellbutrin, working well. No thoughts of hurting  Herself or anyone else  Chronic low back and hip pain- cymbalta , gabapentin works well. H/o lumbar surgeries. Gabapentin 300mg  at bedtime for sleep.    Former smoker.   HISTORY:  Past Medical History:  Diagnosis Date  . Arthritis   . Back pain   . Cancer ([place])    skin  . Depression   . Diverticulosis   . Dizziness    patient had episode of dizziness when came in room. hx past no dx  . GERD (gastroesophageal reflux disease)   . Heart murmur   . PONV (postoperative nausea and vomiting)    yrs ago  . Stroke [facility])    Past Surgical History:  Procedure Laterality Date  . BACK SURGERY  [place] SURGERY  09  . COLONOSCOPY WITH PROPOFOL N/A 01/13/2016   Procedure: COLONOSCOPY WITH PROPOFOL;  Surgeon: [name], MD;  Location: [facility] ENDOSCOPY;  Service: Endoscopy;  Laterality: N/A;  . FACELIFT  94  . LOOP RECORDER INSERTION N/A 08/23/2017   Procedure: LOOP RECORDER INSERTION;  Surgeon: Thompson Grayer, MD;  Location: [place] CV LAB;  Service: Cardiovascular;   Laterality: N/A;  . TEE WITHOUT CARDIOVERSION N/A 08/12/2017   Procedure: TRANSESOPHAGEAL ECHOCARDIOGRAM (TEE);  Surgeon: Sueanne Margarita, MD;  Location: [facility] ENDOSCOPY;  Service: Cardiovascular;  Laterality: N/A;  . TONSILLECTOMY    . TOTAL HIP ARTHROPLASTY Right 04/22/2016   Procedure: TOTAL HIP ARTHROPLASTY;  Surgeon: Corky Mull, MD;  Location: [facility] ORS;  Service: Orthopedics;  Laterality: Right;   No family history on file.  Allergies: Aleve [naproxen sodium]; Aspirin; Celebrex [celecoxib]; Effexor [venlafaxine]; Hydrocodone-homatropine; Ibuprofen; Tape; Vioxx [rofecoxib]; Other; and Tramadol Current Outpatient Prescriptions on File Prior to Visit  Medication Sig Dispense Refill  . atorvastatin (LIPITOR) 40 MG tablet Take 1 tablet (40 mg total) by mouth daily at 6 PM. 30 tablet 0  . buPROPion (WELLBUTRIN XL) 300 MG 24 hr tablet Take 300 mg by mouth daily.     . Calcium Carbonate-Vitamin D (CALTRATE 600+D PO) Take 1 tablet by mouth daily.    . clopidogrel (PLAVIX) 75 MG tablet Take 1 tablet (75 mg total) by mouth daily. 30 tablet 0  . Cyanocobalamin (VITAMIN B-12 PO) Take 1 tablet by mouth daily.    . DULoxetine (CYMBALTA) 60 MG capsule Take 60 mg by mouth daily.     . ferrous sulfate 325 (65 FE) MG tablet Take 1 tablet (325 mg total) by mouth daily. 30 tablet 0  . gabapentin (NEURONTIN) 300 MG capsule Take 300 mg  by mouth at bedtime.   3  . vitamin C (ASCORBIC ACID) 500 MG tablet Take 500 mg by mouth daily.     No current facility-administered medications on file prior to visit.     Social History  Substance Use Topics  . Smoking status: Former Smoker    Packs/day: 1.00    Years: 40.00    Types: Cigarettes    Quit date: 11/13/1996  . Smokeless tobacco: Never Used  . Alcohol use Yes     Comment: occ wine    Review of Systems  Constitutional: Negative for chills and fever.  Eyes: Negative for visual disturbance.  Respiratory: Negative for cough.   Cardiovascular: Negative for  chest pain and palpitations.  Gastrointestinal: Negative for nausea and vomiting.      Objective:    BP 124/76   Pulse (!) 56   Temp 97.6 F (36.4 C) (Oral)   Ht 5\' 4"  (1.626 m)   Wt 198 lb 12.8 oz (90.2 kg)   SpO2 96%   BMI 34.12 kg/m  BP Readings from Last 3 Encounters:  09/27/17 124/76  09/26/17 125/76  08/23/17 (!) 147/61   Wt Readings from Last 3 Encounters:  09/27/17 198 lb 12.8 oz (90.2 kg)  09/26/17 195 lb 6.4 oz (88.6 kg)  08/23/17 190 lb (86.2 kg)    Physical Exam  Constitutional: She appears well-developed and well-nourished.  Eyes: Conjunctivae are normal.  Cardiovascular: Normal rate, regular rhythm, normal heart sounds and normal pulses.   Pulmonary/Chest: Effort normal and breath sounds normal. She has no wheezes. She has no rhonchi. She has no rales.  Neurological: She is alert.  Skin: Skin is warm and dry.  Psychiatric: She has a normal mood and affect. Her speech is normal and behavior is normal. Thought content normal.  Vitals reviewed.      Assessment & Plan:   Problem List Items Addressed This Visit      Cardiovascular and Mediastinum   Stroke (cerebrum) ([place]) - Patchy small volume acute ischemic nonhemorrhagic superior left cerebellar infarct, left SCA territory, of undetermined etiology, with diffuse atherosclerosis    No continued symptoms of stroke.on Plavix, Lipitor. Tolerating well. We'll continue to follow        Other   Low back pain    Chronic. Doing well on Cymbalta, gabapentin. We'll follow      Depression, recurrent ([place])    Doing well on current regimen. Follows with psychiatry.      Pulmonary nodules    Former smoker. Patient is pending appointment with pulmonology. Discussed the importance of ensuring she has a CT chest in the next 6 months to monitor stability of nodules. She verbalized understanding          I am having Ms. Marcum maintain her gabapentin, DULoxetine, Calcium Carbonate-Vitamin D (CALTRATE 600+D  PO), buPROPion, vitamin C, Cyanocobalamin (VITAMIN B-12 PO), atorvastatin, ferrous sulfate, and clopidogrel.   No orders of the defined types were placed in this encounter.   Return precautions given.   Risks, benefits, and alternatives of the medications and treatment plan prescribed today were discussed, and patient expressed understanding.   Education regarding symptom management and diagnosis given to patient on AVS.  Continue to follow with Burnard Hawthorne, FNP for routine health maintenance.   [name] and I agreed with plan.   [name], FNP

## 2017-09-27 NOTE — Assessment & Plan Note (Addendum)
Former smoker. Patient is pending appointment with pulmonology. Discussed the importance of ensuring she has a CT chest in the next 6 months to monitor stability of nodules. She verbalized understanding

## 2017-09-27 NOTE — Assessment & Plan Note (Signed)
Chronic. Doing well on Cymbalta, gabapentin. We'll follow

## 2017-09-27 NOTE — Assessment & Plan Note (Signed)
No continued symptoms of stroke.on Plavix, Lipitor. Tolerating well. We'll continue to follow

## 2017-09-28 ENCOUNTER — Other Ambulatory Visit: Payer: Self-pay | Admitting: Specialist

## 2017-09-28 DIAGNOSIS — R059 Cough, unspecified: Secondary | ICD-10-CM

## 2017-09-28 DIAGNOSIS — R918 Other nonspecific abnormal finding of lung field: Secondary | ICD-10-CM

## 2017-09-28 DIAGNOSIS — R05 Cough: Secondary | ICD-10-CM | POA: Diagnosis not present

## 2017-09-28 DIAGNOSIS — J310 Chronic rhinitis: Secondary | ICD-10-CM | POA: Diagnosis not present

## 2017-09-28 DIAGNOSIS — R0602 Shortness of breath: Secondary | ICD-10-CM | POA: Diagnosis not present

## 2017-09-28 DIAGNOSIS — J8410 Pulmonary fibrosis, unspecified: Secondary | ICD-10-CM | POA: Diagnosis not present

## 2017-09-29 ENCOUNTER — Other Ambulatory Visit: Payer: Self-pay | Admitting: Specialist

## 2017-09-29 DIAGNOSIS — R059 Cough, unspecified: Secondary | ICD-10-CM

## 2017-09-29 DIAGNOSIS — R0602 Shortness of breath: Secondary | ICD-10-CM

## 2017-09-29 DIAGNOSIS — R05 Cough: Secondary | ICD-10-CM

## 2017-10-03 ENCOUNTER — Telehealth: Payer: Self-pay | Admitting: Family

## 2017-10-03 DIAGNOSIS — Z9889 Other specified postprocedural states: Secondary | ICD-10-CM

## 2017-10-03 NOTE — Telephone Encounter (Signed)
Patient asking for a referral to [facility] [facility]ology. She has a loop recorder.

## 2017-10-03 NOTE — Telephone Encounter (Signed)
Please advise 

## 2017-10-04 DIAGNOSIS — Z9889 Other specified postprocedural states: Secondary | ICD-10-CM | POA: Insufficient documentation

## 2017-10-04 NOTE — Telephone Encounter (Signed)
Referral placed.

## 2017-10-07 ENCOUNTER — Telehealth: Payer: Self-pay

## 2017-10-07 NOTE — Telephone Encounter (Signed)
FYI  Copied from [place] [id number]. Topic: General - Other >> Oct 07, 2017 11:43 AM Ahmed Prima L wrote: Patient called to say that she needed to let her PCP Arnett know that she is seeing the Cardiologist on 11/13 due to dizziness & bad balance. (per cardiologist).

## 2017-10-07 NOTE — Telephone Encounter (Signed)
Spoke with patient and reviewed that device did not show any episodes. She has reports of dizziness and balance issues with symptoms being worse in the AM. Instructed her to keep some water available to drink first thing in the AM to see if that would help. Also reviewed importance of monitoring blood pressure readings and keeping a log. Discussed how blood pressure can change with position changes and fall precautions as well. Instructed her to bring those readings in with her to upcoming appointment. Advised her to please continue monitoring and what symptoms to seek emergency evaluation. Confirmed her upcoming appointment information and she was appreciative for the call back.

## 2017-10-07 NOTE — Telephone Encounter (Signed)
Patient called to schedule new patient appt from referral   Hx recent implant Loop recorder

## 2017-10-07 NOTE — Telephone Encounter (Signed)
Patient scheduled with Dr. Caryl Comes in [place] for 11/13   C/o increased dizziness and balance issues since stroke and is concerned about worsening sx in the morning

## 2017-10-08 NOTE — Telephone Encounter (Signed)
Thanks for letting me know  If any worsening OR new symptoms prior to appt,  please let us know.

## 2017-10-12 DIAGNOSIS — I69393 Ataxia following cerebral infarction: Secondary | ICD-10-CM | POA: Diagnosis not present

## 2017-10-12 DIAGNOSIS — L821 Other seborrheic keratosis: Secondary | ICD-10-CM | POA: Diagnosis not present

## 2017-10-12 DIAGNOSIS — L570 Actinic keratosis: Secondary | ICD-10-CM | POA: Diagnosis not present

## 2017-10-12 DIAGNOSIS — M1990 Unspecified osteoarthritis, unspecified site: Secondary | ICD-10-CM | POA: Diagnosis not present

## 2017-10-12 DIAGNOSIS — L99 Other disorders of skin and subcutaneous tissue in diseases classified elsewhere: Secondary | ICD-10-CM | POA: Diagnosis not present

## 2017-10-12 DIAGNOSIS — D692 Other nonthrombocytopenic purpura: Secondary | ICD-10-CM | POA: Diagnosis not present

## 2017-10-12 DIAGNOSIS — L820 Inflamed seborrheic keratosis: Secondary | ICD-10-CM | POA: Diagnosis not present

## 2017-10-12 DIAGNOSIS — D225 Melanocytic nevi of trunk: Secondary | ICD-10-CM | POA: Diagnosis not present

## 2017-10-12 DIAGNOSIS — D1801 Hemangioma of skin and subcutaneous tissue: Secondary | ICD-10-CM | POA: Diagnosis not present

## 2017-10-12 DIAGNOSIS — L578 Other skin changes due to chronic exposure to nonionizing radiation: Secondary | ICD-10-CM | POA: Diagnosis not present

## 2017-10-12 DIAGNOSIS — Z1283 Encounter for screening for malignant neoplasm of skin: Secondary | ICD-10-CM | POA: Diagnosis not present

## 2017-10-12 DIAGNOSIS — M6281 Muscle weakness (generalized): Secondary | ICD-10-CM | POA: Diagnosis not present

## 2017-10-12 DIAGNOSIS — L812 Freckles: Secondary | ICD-10-CM | POA: Diagnosis not present

## 2017-10-12 DIAGNOSIS — Z85828 Personal history of other malignant neoplasm of skin: Secondary | ICD-10-CM | POA: Diagnosis not present

## 2017-10-12 DIAGNOSIS — M4316 Spondylolisthesis, lumbar region: Secondary | ICD-10-CM | POA: Diagnosis not present

## 2017-10-18 ENCOUNTER — Ambulatory Visit (INDEPENDENT_AMBULATORY_CARE_PROVIDER_SITE_OTHER): Payer: Medicare Other | Admitting: Internal Medicine

## 2017-10-18 ENCOUNTER — Other Ambulatory Visit: Payer: Self-pay

## 2017-10-18 ENCOUNTER — Telehealth: Payer: Self-pay | Admitting: Internal Medicine

## 2017-10-18 ENCOUNTER — Encounter: Payer: Self-pay | Admitting: Internal Medicine

## 2017-10-18 VITALS — BP 135/86 | HR 76 | Ht 64.0 in | Wt 197.0 lb

## 2017-10-18 DIAGNOSIS — I7090 Unspecified atherosclerosis: Secondary | ICD-10-CM

## 2017-10-18 DIAGNOSIS — E7800 Pure hypercholesterolemia, unspecified: Secondary | ICD-10-CM

## 2017-10-18 DIAGNOSIS — E785 Hyperlipidemia, unspecified: Secondary | ICD-10-CM | POA: Diagnosis not present

## 2017-10-18 DIAGNOSIS — I63219 Cerebral infarction due to unspecified occlusion or stenosis of unspecified vertebral arteries: Secondary | ICD-10-CM

## 2017-10-18 DIAGNOSIS — Z7689 Persons encountering health services in other specified circumstances: Secondary | ICD-10-CM

## 2017-10-18 DIAGNOSIS — I639 Cerebral infarction, unspecified: Secondary | ICD-10-CM

## 2017-10-18 DIAGNOSIS — Z9889 Other specified postprocedural states: Secondary | ICD-10-CM

## 2017-10-18 HISTORY — DX: Unspecified atherosclerosis: I70.90

## 2017-10-18 HISTORY — DX: Hyperlipidemia, unspecified: E78.5

## 2017-10-18 LAB — CUP PACEART INCLINIC DEVICE CHECK
Date Time Interrogation Session: 20181113131855
Implantable Pulse Generator Implant Date: 20180918

## 2017-10-18 MED ORDER — ATORVASTATIN CALCIUM 80 MG PO TABS: 80.0000 mg | ORAL_TABLET | Freq: Every day | ORAL | 3 refills | Status: DC

## 2017-10-18 NOTE — Progress Notes (Signed)
ELECTROPHYSIOLOGY OFFICE NOTE  Patient ID: Melody Ochoa, MRN: [id number], DOB/AGE: June 03, 1941 76 y.o. Admit date: (Not on file) Date of Consult: 10/18/2017  Primary Physician: Burnard Hawthorne, FNP Primary Cardiologist: new      HPI Melody Ochoa is a 76 y.o. female  Seen following ILR LINQ implantation 9/*18 for Cryptogenic Stroke by [facility]  No intercurrent neurological symptoms  Mild sob no chest pressure  occ edema   Echocardiogram demonstrated EF 50-55%, mild LVH, grade 1 diastolic dysfunction, LA 44.  TEE  Bulky aortic calcified plaque/ MR   Date LDL  9/18 126   /          Past Medical History:  Diagnosis Date  . Arthritis   . Atheromatous plaque 10/18/2017  . Back pain   . Cancer ([place])    skin  . Depression   . Diverticulosis   . Dizziness    patient had episode of dizziness when came in room. hx past no dx  . GERD (gastroesophageal reflux disease)   . Heart murmur   . Hyperlipidemia 10/18/2017  . PONV (postoperative nausea and vomiting)    yrs ago  . Stroke [facility])       Surgical History:  Past Surgical History:  Procedure Laterality Date  . BACK SURGERY  [place] SURGERY  09  . FACELIFT  94  . TONSILLECTOMY       Home Meds: Prior to Admission medications   Medication Sig Start Date End Date Taking? Authorizing Provider  atorvastatin (LIPITOR) 40 MG tablet Take 1 tablet (40 mg total) by mouth daily at 6 PM. 08/12/17  Yes Patteson, Arlan Organ, NP  buPROPion (WELLBUTRIN XL) 300 MG 24 hr tablet Take 300 mg by mouth daily.  07/15/17  Yes [provider]  Calcium Carbonate-Vitamin D (CALTRATE 600+D PO) Take 1 tablet by mouth daily.   Yes [provider]  clopidogrel (PLAVIX) 75 MG tablet Take 1 tablet (75 mg total) by mouth daily. 08/13/17  Yes Patteson, Arlan Organ, NP  Cyanocobalamin (VITAMIN B-12 PO) Take 1 tablet by mouth daily.   Yes [provider]  DULoxetine (CYMBALTA) 60 MG capsule Take 60 mg by mouth  daily.    Yes [provider]  ferrous sulfate 325 (65 FE) MG tablet Take 1 tablet (325 mg total) by mouth daily. 08/12/17  Yes Patteson, Arlan Organ, NP  gabapentin (NEURONTIN) 300 MG capsule Take 300 mg by mouth at bedtime.  09/01/14  Yes [provider]  vitamin C (ASCORBIC ACID) 500 MG tablet Take 500 mg by mouth daily.   Yes [provider]    Allergies:  Allergies  Allergen Reactions  . Aleve [Naproxen Sodium] Other (See Comments)    Gi upset  . Aspirin Other (See Comments)    Stomach pain-aggravates diverticulosis  . Celebrex [Celecoxib] Other (See Comments)    Dizziness   . Effexor [Venlafaxine] Other (See Comments)    Hot flashes   . Hydrocodone-Homatropine Diarrhea  . Ibuprofen Other (See Comments)    Gi upset   . Tape Itching and Other (See Comments)    Itchy blisters  . Vioxx [Rofecoxib] Other (See Comments)    Gi upset  . Other Rash and Other (See Comments)    bandaides  . Tramadol Other (See Comments)    hallucinations    Social History   Socioeconomic History  . Marital status: Married    Spouse name: Not on file  .  Number of children: Not on file  . Years of education: Not on file  . Highest education level: Not on file  Social Needs  . Financial resource strain: Not on file  . Food insecurity - worry: Not on file  . Food insecurity - inability: Not on file  . Transportation needs - medical: Not on file  . Transportation needs - non-medical: Not on file  Occupational History  . Not on file  Tobacco Use  . Smoking status: Former Smoker    Packs/day: 1.00    Years: 40.00    Pack years: 40.00    Types: Cigarettes    Last attempt to quit: 11/13/1996    Years since quitting: 20.9  . Smokeless tobacco: Never Used  Substance and Sexual Activity  . Alcohol use: Yes    Comment: occ wine  . Drug use: No  . Sexual activity: Not on file  Other Topics Concern  . Not on file  Social History Narrative  . Not on file     History  reviewed. No pertinent family history.   ROS:  Please see the history of present illness.     All other systems reviewed and negative.    Physical Exam:  Blood pressure 135/86, pulse 76, height 5\' 4"  (1.626 m), weight 197 lb (89.4 kg). Well developed and nourished in no acute distress HENT normal Neck supple with JVP-flat Clear Regular rate and rhythm, no murmurs or gallops Abd-soft with active BS No Clubbing cyanosis edema Skin-warm and dry A & Oriented  Grossly normal sensory and motor function       Labs: Cardiac Enzymes No results for input(s): CKTOTAL, CKMB, TROPONINI in the last 72 hours. CBC Lab Results  Component Value Date   WBC 5.8 08/19/2017   HGB 11.2 (L) 08/19/2017   HCT 32.9 (L) 08/19/2017   MCV 85.8 08/19/2017   PLT 318 08/19/2017   PROTIME: No results for input(s): LABPROT, INR in the last 72 hours. Chemistry No results for input(s): NA, K, CL, CO2, BUN, CREATININE, CALCIUM, PROT, BILITOT, ALKPHOS, ALT, AST, GLUCOSE in the last 168 hours.  Invalid input(s): LABALBU Lipids Lab Results  Component Value Date   CHOL 199 08/10/2017   HDL 65 08/10/2017   LDLCALC 126 (H) 08/10/2017   TRIG 39 08/10/2017   BNP No results found for: PROBNP Thyroid Function Tests: No results for input(s): TSH, T4TOTAL, T3FREE, THYROIDAB in the last 72 hours.  Invalid input(s): FREET3 Miscellaneous No results found for: DDIMER  Radiology/Studies:  No results found.  EKG: sinus 76 16/10/42 LVH by voltage but without repol abnormality   Assessment and Plan:  Cryptogenic Stroke   Aortic plaque  LINQ in place  Hyperlipidemia   Reviewed the implications of detecting atrial fibrillation and change in therapy  Also Rx for aortic plague   Target LDL as for Secondary prevention CAD  Last LDL was 126 On atorva 40  Will increase to 80 and recheck in about 2 months  We spent more than 50% of our >25 min visit in face to face counseling regarding the  above    Virl Axe

## 2017-10-18 NOTE — Patient Instructions (Signed)
Medication Instructions:  Your physician recommends that you continue on your current medications as directed. Please refer to the Current Medication list given to you today.   Labwork: none  Testing/Procedures: none  Follow-Up: Your physician recommends that you schedule a follow-up appointment with Dr. Caryl Comes as needed.    Any Other Special Instructions Will Be Listed Below (If Applicable).     If you need a refill on your cardiac medications before your next appointment, please call your pharmacy.

## 2017-10-18 NOTE — Telephone Encounter (Signed)
Per Dr. Olin Pia notes: "Also Rx for aortic plague   Target LDL as for Secondary prevention CAD  Last LDL was 126 On atorva 40  Will increase to 80 and recheck in about 2 months"  S/w pt who is agreeable w/plan.  Pres[facility] per pt request. Lab orders mailed to home address.

## 2017-10-24 ENCOUNTER — Encounter: Payer: Medicare Other | Admitting: Cardiology

## 2017-10-31 ENCOUNTER — Ambulatory Visit: Payer: Self-pay | Admitting: *Deleted

## 2017-10-31 NOTE — Telephone Encounter (Signed)
   Reason for Disposition . Cough has been present for > 3 weeks  Answer Assessment - Initial Assessment Questions 1. ONSET: "When did the cough begin?"      2 weeks- 3 weeks 2. SEVERITY: "How bad is the cough today?"      Moderate, wakes her up at night, annoying 3. RESPIRATORY DISTRESS: "Describe your breathing."     normal 4. FEVER: "Do you have a fever?" If so, ask: "What is your temperature, how was it measured, and when did it start?"     no 5. SPUTUM: "Describe the color of your sputum" (clear, white, yellow, green)     Greyish, thick 6. HEMOPTYSIS: "Are you coughing up any blood?" If so ask: "How much?" (flecks, streaks, tablespoons, etc.)     no 7. CARDIAC HISTORY: "Do you have any history of heart disease?" (e.g., heart attack, congestive heart failure)     no 8. LUNG HISTORY: "Do you have any history of lung disease?"  (e.g., pulmonary embolus, asthma, emphysema)    no 9. PE RISK FACTORS: "Do you have a history of blood clots?" (or: recent major surgery, recent prolonged travel, bedridden )    On blood thinner and cholesterol med 10. OTHER SYMPTOMS: "Do you have any other symptoms?" (e.g., runny nose, wheezing, chest pain)       no 11. PREGNANCY: "Is there any chance you are pregnant?" "When was your last menstrual period?"      no 12. TRAVEL: "Have you traveled out of the country in the last month?" (e.g., travel history, exposures)     no  Protocols used: [place]

## 2017-11-21 ENCOUNTER — Ambulatory Visit (INDEPENDENT_AMBULATORY_CARE_PROVIDER_SITE_OTHER): Payer: Medicare Other | Admitting: *Deleted

## 2017-11-21 DIAGNOSIS — I63219 Cerebral infarction due to unspecified occlusion or stenosis of unspecified vertebral arteries: Secondary | ICD-10-CM

## 2017-11-21 NOTE — Progress Notes (Signed)
Carelink Summary Report / Loop Recorder 

## 2017-11-22 ENCOUNTER — Other Ambulatory Visit: Payer: Self-pay

## 2017-11-22 NOTE — Patient Outreach (Signed)
Telephone outreach to patient to obtain mRs was successfully completed. mRs= 0. 

## 2017-11-23 DIAGNOSIS — M1612 Unilateral primary osteoarthritis, left hip: Secondary | ICD-10-CM | POA: Diagnosis not present

## 2017-12-07 LAB — CUP PACEART REMOTE DEVICE CHECK
Date Time Interrogation Session: 20181217130645
Implantable Pulse Generator Implant Date: 20180918

## 2017-12-13 DIAGNOSIS — H2513 Age-related nuclear cataract, bilateral: Secondary | ICD-10-CM | POA: Diagnosis not present

## 2017-12-15 ENCOUNTER — Ambulatory Visit (INDEPENDENT_AMBULATORY_CARE_PROVIDER_SITE_OTHER): Payer: Medicare Other

## 2017-12-15 ENCOUNTER — Ambulatory Visit (INDEPENDENT_AMBULATORY_CARE_PROVIDER_SITE_OTHER): Payer: Medicare Other | Admitting: Orthopedic Surgery

## 2017-12-15 DIAGNOSIS — M25552 Pain in left hip: Secondary | ICD-10-CM

## 2017-12-15 DIAGNOSIS — G8929 Other chronic pain: Secondary | ICD-10-CM | POA: Diagnosis not present

## 2017-12-15 DIAGNOSIS — M5442 Lumbago with sciatica, left side: Secondary | ICD-10-CM | POA: Diagnosis not present

## 2017-12-16 ENCOUNTER — Encounter (INDEPENDENT_AMBULATORY_CARE_PROVIDER_SITE_OTHER): Payer: Self-pay | Admitting: Orthopedic Surgery

## 2017-12-16 NOTE — Progress Notes (Signed)
Office Visit Note   Patient: Melody Ochoa           Date of Birth: 1941/10/28           MRN: [id number] Visit Date: 12/15/2017 Requested by: Burnard Hawthorne, FNP [address] [place], [place] [address] PCP: Burnard Hawthorne, FNP  Subjective: Chief Complaint  Patient presents with  . Left Hip - Pain    HPI: Melody Ochoa is a patient with left hip pain.  She reports pain primarily in the gluteal region and does not report much in the way of pain radiating from the groin down the leg.  She does have some relief when she lays down as opposed to stand up.  She reports burning in the lateral region.  She has had right total hip replacement done last year but does not want to go back.  She has had a history of low back surgery x3 with the second surgery being performed by Dr. Arnoldo Morale.  She wants to be able to swim.  She localizes pain really in the anterior superior iliac crest region on the left and not really in the groin.              ROS: All systems reviewed are negative as they relate to the chief complaint within the history of present illness.  Patient denies  fevers or chills.   Assessment & Plan: Visit Diagnoses:  1. Pain in left hip   2. Chronic midline low back pain with left-sided sciatica     Plan: Impression is burning pain in the left leg and buttock region with no arthritis in the left hip on plain radiographs.  I think this is likely coming from her back.  We talked about who she wanted to see to try to work this up.  She has had 3 back surgeries by 3 different back surgeons.  The one who is in [place] is Dr. Arnoldo Morale so we will send her to him for further evaluation and management.  Follow-up with me as needed  Follow-Up Instructions: Return if symptoms worsen or fail to improve.   Orders:  Orders Placed This Encounter  Procedures  . XR HIP UNILAT W OR W/O PELVIS 2-3 VIEWS LEFT  . Ambulatory referral to Neurosurgery   No orders of the defined types  were placed in this encounter.     Procedures: No procedures performed   Clinical Data: No additional findings.  Objective: Vital Signs: There were no vitals taken for this visit.  Physical Exam:   Constitutional: Patient appears well-developed HEENT:  Head: Normocephalic Eyes:EOM are normal Neck: Normal range of motion Cardiovascular: Normal rate Pulmonary/chest: Effort normal Neurologic: Patient is alert Skin: Skin is warm Psychiatric: Patient has normal mood and affect    Ortho Exam: Orthopedic exam demonstrates pretty normal gait and alignment with no nerve root tension signs.  Importantly she has excellent range of motion of hips on both sides with no groin pain or any pain with internal and external rotation of that left hip.  Motor sensory function to the feet intact.  Pedal pulses palpable.  No muscle atrophy in the legs.  The back incision is intact.  There is no warmth or erythema in the back incision  Specialty Comments:  No specialty comments available.  Imaging: No results found.   PMFS History: Patient Active Problem List   Diagnosis Date Noted  . Atheromatous plaque 10/18/2017  . Hyperlipidemia 10/18/2017  . History of loop  recorder 10/04/2017  . Depression, recurrent ([place]) 09/27/2017  . Pulmonary nodules 09/27/2017  . Stroke (cerebrum) ([facility]) - Patchy small volume acute ischemic nonhemorrhagic superior left cerebellar infarct, left SCA territory, of undetermined etiology, with diffuse atherosclerosis 08/10/2017  . Status post total replacement of right hip 04/22/2016  . Personal history of colonic polyps   . Back pain 11/22/2014  . UTI (lower urinary tract infection) 11/22/2014  . Low back pain 11/22/2014  . Spondylolisthesis of lumbar region 11/18/2014   Past Medical History:  Diagnosis Date  . Arthritis   . Atheromatous plaque 10/18/2017  . Back pain   . Cancer ([place])    skin  . Depression   . Diverticulosis   . Dizziness    patient had  episode of dizziness when came in room. hx past no dx  . GERD (gastroesophageal reflux disease)   . Heart murmur   . Hyperlipidemia 10/18/2017  . PONV (postoperative nausea and vomiting)    yrs ago  . Stroke [facility])     History reviewed. No pertinent family history.  Past Surgical History:  Procedure Laterality Date  . BACK SURGERY  [place] SURGERY  09  . COLONOSCOPY WITH PROPOFOL N/A 01/13/2016   Procedure: COLONOSCOPY WITH PROPOFOL;  Surgeon: [name], MD;  Location: [facility] ENDOSCOPY;  Service: Endoscopy;  Laterality: N/A;  . FACELIFT  94  . LOOP RECORDER INSERTION N/A 08/23/2017   Procedure: LOOP RECORDER INSERTION;  Surgeon: Thompson Grayer, MD;  Location: [place] CV LAB;  Service: Cardiovascular;  Laterality: N/A;  . TEE WITHOUT CARDIOVERSION N/A 08/12/2017   Procedure: TRANSESOPHAGEAL ECHOCARDIOGRAM (TEE);  Surgeon: Sueanne Margarita, MD;  Location: [facility] ENDOSCOPY;  Service: Cardiovascular;  Laterality: N/A;  . TONSILLECTOMY    . TOTAL HIP ARTHROPLASTY Right 04/22/2016   Procedure: TOTAL HIP ARTHROPLASTY;  Surgeon: Corky Mull, MD;  Location: [facility] ORS;  Service: Orthopedics;  Laterality: Right;   Social History   Occupational History  . Not on file  Tobacco Use  . Smoking status: Former Smoker    Packs/day: 1.00    Years: 40.00    Pack years: 40.00    Types: Cigarettes    Last attempt to quit: 11/13/1996    Years since quitting: 21.1  . Smokeless tobacco: Never Used  Substance and Sexual Activity  . Alcohol use: Yes    Comment: occ wine  . Drug use: No  . Sexual activity: Not on file

## 2017-12-21 ENCOUNTER — Ambulatory Visit (INDEPENDENT_AMBULATORY_CARE_PROVIDER_SITE_OTHER): Payer: Medicare Other | Admitting: *Deleted

## 2017-12-21 DIAGNOSIS — I63219 Cerebral infarction due to unspecified occlusion or stenosis of unspecified vertebral arteries: Secondary | ICD-10-CM | POA: Diagnosis not present

## 2017-12-22 NOTE — Progress Notes (Signed)
Carelink Summary Report / Loop Recorder 

## 2017-12-29 ENCOUNTER — Telehealth (INDEPENDENT_AMBULATORY_CARE_PROVIDER_SITE_OTHER): Payer: Self-pay | Admitting: *Deleted

## 2017-12-29 NOTE — Telephone Encounter (Signed)
I received a fax from [facility] stating Dr. Arnoldo Morale had advised pt that she needs to follow up with the surgeon who did her last surgery.   I called pt left vmom to RC in regarding this referral to find out who did her last surgery. Pending call back,

## 2017-12-30 LAB — CUP PACEART REMOTE DEVICE CHECK
Date Time Interrogation Session: 20190116133842
Implantable Pulse Generator Implant Date: 20180918

## 2018-01-16 ENCOUNTER — Ambulatory Visit (INDEPENDENT_AMBULATORY_CARE_PROVIDER_SITE_OTHER): Payer: Medicare Other | Admitting: Family

## 2018-01-16 VITALS — BP 136/74 | HR 80 | Temp 97.5°F | Resp 18 | Wt 197.0 lb

## 2018-01-16 DIAGNOSIS — G8929 Other chronic pain: Secondary | ICD-10-CM | POA: Diagnosis not present

## 2018-01-16 DIAGNOSIS — Z23 Encounter for immunization: Secondary | ICD-10-CM

## 2018-01-16 DIAGNOSIS — R21 Rash and other nonspecific skin eruption: Secondary | ICD-10-CM | POA: Insufficient documentation

## 2018-01-16 DIAGNOSIS — M545 Low back pain, unspecified: Secondary | ICD-10-CM

## 2018-01-16 DIAGNOSIS — I63219 Cerebral infarction due to unspecified occlusion or stenosis of unspecified vertebral arteries: Secondary | ICD-10-CM

## 2018-01-16 DIAGNOSIS — Z1231 Encounter for screening mammogram for malignant neoplasm of breast: Secondary | ICD-10-CM | POA: Diagnosis not present

## 2018-01-16 MED ORDER — DOXYCYCLINE HYCLATE 100 MG PO TABS: 100.0000 mg | ORAL_TABLET | Freq: Two times a day (BID) | ORAL | 0 refills | Status: DC

## 2018-01-16 MED ORDER — MUPIROCIN 2 % EX OINT: 1.0000 "application " | TOPICAL_OINTMENT | Freq: Two times a day (BID) | CUTANEOUS | 1 refills | Status: DC

## 2018-01-16 NOTE — Assessment & Plan Note (Addendum)
small localized pustule. Trial of oral antibiotic since topical antibiotic failed, advised warm compresses. Return precautions given.

## 2018-01-16 NOTE — Assessment & Plan Note (Signed)
Chronic. Declines repeat imaging today. Prefers to have neurosurgeon order. Replaced referral to patients preferred neurosurgeon. Will follow.

## 2018-01-16 NOTE — Patient Instructions (Addendum)
Lets start with neurosurgery.   Let me know if back pain worsens or you have problems getting an appointment.   Trial of topical antibiotic on left finger- let me know if not better.   Pleasure seeing you

## 2018-01-16 NOTE — Progress Notes (Addendum)
Subjective:    Patient ID: Melody Ochoa, female    DOB: [birth year], 77 y.o.   MRN: [id number]  CC: Melody Ochoa is a 77 y.o. female who presents today for follow up.   HPI: Chronic low back pain, more on left side for years.  Describes severe pain on left low side. Unchanged.  No leg numbness, tingling, le weakness. Pain worsens with walking long distances. Improves with gabapentin, cymbalta. No fever, dysuria  Complains of left pointer finger "bump" for the past week' , unchanged. Feels like should drain.' No fever. No injury.   Follows with Dr Marlou Sa in [place] who he saw last week. H/o of multiple back surgeries and would like to see a neurosurogeon however told that Dr Arnoldo Morale would not see her.   Follows with Dr Caryl Comes.   Would like mammogram and understands to schedule.      XR hip 12/2017  MR lumbar spine 09/2016   HISTORY:  Past Medical History:  Diagnosis Date  . Arthritis   . Atheromatous plaque 10/18/2017  . Back pain   . Cancer ([place])    skin  . Depression   . Diverticulosis   . Dizziness    patient had episode of dizziness when came in room. hx past no dx  . GERD (gastroesophageal reflux disease)   . Heart murmur   . Hyperlipidemia 10/18/2017  . PONV (postoperative nausea and vomiting)    yrs ago  . Stroke [facility])    Past Surgical History:  Procedure Laterality Date  . BACK SURGERY  [place] [facility]  09  . COLONOSCOPY WITH PROPOFOL N/A 01/13/2016   Procedure: COLONOSCOPY WITH PROPOFOL;  Surgeon: [name], MD;  Location: [facility] ENDOSCOPY;  Service: Endoscopy;  Laterality: N/A;  . FACELIFT  94  . LOOP RECORDER INSERTION N/A 08/23/2017   Procedure: LOOP RECORDER INSERTION;  Surgeon: Thompson Grayer, MD;  Location: [place] [facility];  Service: Cardiovascular;  Laterality: N/A;  . TEE WITHOUT CARDIOVERSION N/A 08/12/2017   Procedure: TRANSESOPHAGEAL ECHOCARDIOGRAM (TEE);  Surgeon: Sueanne Margarita, MD;  Location: [facility] ENDOSCOPY;  Service:  Cardiovascular;  Laterality: N/A;  . TONSILLECTOMY    . TOTAL HIP ARTHROPLASTY Right 04/22/2016   Procedure: TOTAL HIP ARTHROPLASTY;  Surgeon: Corky Mull, MD;  Location: [facility] ORS;  Service: Orthopedics;  Laterality: Right;   No family history on file.  Allergies: Aleve [naproxen sodium]; Aspirin; Celebrex [celecoxib]; Effexor [venlafaxine]; Hydrocodone-homatropine; Ibuprofen; Tape; Vioxx [rofecoxib]; Other; and Tramadol Current Outpatient Medications on File Prior to Visit  Medication Sig Dispense Refill  . atorvastatin (LIPITOR) 80 MG tablet Take 1 tablet (80 mg total) daily at 6 PM by mouth. 90 tablet 3  . buPROPion (WELLBUTRIN XL) 300 MG 24 hr tablet Take 300 mg by mouth daily.     . Calcium Carbonate-Vitamin D (CALTRATE 600+D PO) Take 1 tablet by mouth daily.    . clopidogrel (PLAVIX) 75 MG tablet Take 1 tablet (75 mg total) by mouth daily. 30 tablet 0  . Cyanocobalamin (VITAMIN B-12 PO) Take 1 tablet by mouth daily.    . DULoxetine (CYMBALTA) 60 MG capsule Take 60 mg by mouth daily.     . ferrous sulfate 325 (65 FE) MG tablet Take 1 tablet (325 mg total) by mouth daily. 30 tablet 0  . gabapentin (NEURONTIN) 300 MG capsule Take 300 mg by mouth at bedtime.   3  . vitamin C (ASCORBIC ACID) 500 MG tablet Take 500 mg by mouth  daily.     No current facility-administered medications on file prior to visit.     Social History   Tobacco Use  . Smoking status: Former Smoker    Packs/day: 1.00    Years: 40.00    Pack years: 40.00    Types: Cigarettes    Last attempt to quit: 11/13/1996    Years since quitting: 21.1  . Smokeless tobacco: Never Used  Substance Use Topics  . Alcohol use: Yes    Comment: occ wine  . Drug use: No    Review of Systems  Constitutional: Negative for chills and fever.  Respiratory: Negative for cough.   Cardiovascular: Negative for chest pain and palpitations.  Gastrointestinal: Negative for nausea and vomiting.  Genitourinary: Negative for frequency.    Musculoskeletal: Positive for back pain.  Neurological: Negative for weakness and numbness.      Objective:    BP 136/74 (BP Location: Right Arm, Patient Position: Sitting, Cuff Size: Normal)   Pulse 80   Temp (!) 97.5 F (36.4 C) (Oral)   Resp 18   Wt 197 lb (89.4 kg)   SpO2 97%   BMI 33.81 kg/m  BP Readings from Last 3 Encounters:  01/16/18 136/74  10/18/17 135/86  09/27/17 124/76   Wt Readings from Last 3 Encounters:  01/16/18 197 lb (89.4 kg)  10/18/17 197 lb (89.4 kg)  09/27/17 198 lb 12.8 oz (90.2 kg)    Physical Exam  Constitutional: She appears well-developed and well-nourished.  Eyes: Conjunctivae are normal.  Cardiovascular: Normal rate, regular rhythm, normal heart sounds and normal pulses.  Pulmonary/Chest: Effort normal and breath sounds normal. She has no wheezes. She has no rhonchi. She has no rales.  Musculoskeletal:       Lumbar back: She exhibits normal range of motion, no tenderness, no bony tenderness, no swelling, no edema, no pain and no spasm.       Back:       Hands: Tender pustule noted. No drainage, surrounding erythema or increased warmth  Full range of motion with flexion, tension, lateral side bends. No bony tenderness. Pain over left SI joint.  No pain, numbness, tingling elicited with single leg raise bilaterally.   Neurological: She is alert. She has normal strength. No sensory deficit.  Reflex Scores:      Patellar reflexes are 2+ on the right side and 2+ on the left side. Sensation and strength intact bilateral lower extremities.  Skin: Skin is warm and dry.  Psychiatric: She has a normal mood and affect. Her speech is normal and behavior is normal. Thought content normal.  Vitals reviewed.      Assessment & Plan:   Problem List Items Addressed This Visit      Musculoskeletal and Integument   Rash    small localized pustule. Trial of oral antibiotic since topical antibiotic failed, advised warm compresses. Return precautions  given.       Relevant Medications   mupirocin ointment (BACTROBAN) 2 %   doxycycline (VIBRA-TABS) 100 MG tablet     Other   Low back pain - Primary    Chronic. Declines repeat imaging today. Prefers to have neurosurgeon order. Replaced referral to patients preferred neurosurgeon. Will follow.       Relevant Orders   Ambulatory referral to Neurosurgery    Other Visit Diagnoses    Screening mammogram, encounter for           I am having Melody Ochoa start on mupirocin ointment and  doxycycline. I am also having her maintain her gabapentin, DULoxetine, Calcium Carbonate-Vitamin D (CALTRATE 600+D PO), buPROPion, vitamin C, Cyanocobalamin (VITAMIN B-12 PO), ferrous sulfate, clopidogrel, and atorvastatin.   Meds ordered this encounter  Medications  . mupirocin ointment (BACTROBAN) 2 %    Sig: Apply 1 application topically 2 (two) times daily.    Dispense:  22 g    Refill:  1    Order Specific Question:   Supervising Provider    Answer:   TULLO, TERESA L [[id number]]  . doxycycline (VIBRA-TABS) 100 MG tablet    Sig: Take 1 tablet (100 mg total) by mouth 2 (two) times daily.    Dispense:  10 tablet    Refill:  0    Order Specific Question:   Supervising Provider    Answer:   Crecencio Mc [[id number]]    Return precautions given.   Risks, benefits, and alternatives of the medications and treatment plan prescribed today were discussed, and patient expressed understanding.   Education regarding symptom management and diagnosis given to patient on AVS.  Continue to follow with Burnard Hawthorne, FNP for routine health maintenance.   Melody Ochoa and I agreed with plan.   [name], FNP

## 2018-01-16 NOTE — Addendum Note (Signed)
Addended by: Burnard Hawthorne on: 01/16/2018 02:39 PM   Modules accepted: Orders

## 2018-01-17 ENCOUNTER — Telehealth: Payer: Self-pay

## 2018-01-17 DIAGNOSIS — Z1231 Encounter for screening mammogram for malignant neoplasm of breast: Secondary | ICD-10-CM

## 2018-01-19 ENCOUNTER — Telehealth: Payer: Self-pay | Admitting: Family

## 2018-01-19 NOTE — Telephone Encounter (Signed)
Copied from [place] [id number]. Topic: General - Other >> Jan 19, 2018  3:11 PM Lolita Rieger, [place] wrote: Reason for CRM: pt called to left you know that she has heard from the neurologist and that he will not see pt and no one in that practice will so pt needs a new neurologist pt prefers someone in [facility] network Please contact pt with any questions [phone]

## 2018-01-19 NOTE — Telephone Encounter (Signed)
Please advise 

## 2018-01-20 ENCOUNTER — Ambulatory Visit (INDEPENDENT_AMBULATORY_CARE_PROVIDER_SITE_OTHER): Payer: Medicare Other | Admitting: *Deleted

## 2018-01-20 DIAGNOSIS — I63219 Cerebral infarction due to unspecified occlusion or stenosis of unspecified vertebral arteries: Secondary | ICD-10-CM | POA: Diagnosis not present

## 2018-01-23 NOTE — Progress Notes (Signed)
Carelink Summary Report / Loop Recorder 

## 2018-01-26 DIAGNOSIS — M4326 Fusion of spine, lumbar region: Secondary | ICD-10-CM | POA: Diagnosis not present

## 2018-01-26 DIAGNOSIS — M48062 Spinal stenosis, lumbar region with neurogenic claudication: Secondary | ICD-10-CM | POA: Diagnosis not present

## 2018-01-26 DIAGNOSIS — Z981 Arthrodesis status: Secondary | ICD-10-CM | POA: Diagnosis not present

## 2018-01-27 ENCOUNTER — Other Ambulatory Visit: Payer: Self-pay | Admitting: Neurosurgery

## 2018-01-27 DIAGNOSIS — Z981 Arthrodesis status: Secondary | ICD-10-CM

## 2018-02-03 DIAGNOSIS — L300 Nummular dermatitis: Secondary | ICD-10-CM | POA: Diagnosis not present

## 2018-02-03 DIAGNOSIS — Z85828 Personal history of other malignant neoplasm of skin: Secondary | ICD-10-CM | POA: Diagnosis not present

## 2018-02-03 DIAGNOSIS — L578 Other skin changes due to chronic exposure to nonionizing radiation: Secondary | ICD-10-CM | POA: Diagnosis not present

## 2018-02-03 DIAGNOSIS — D692 Other nonthrombocytopenic purpura: Secondary | ICD-10-CM | POA: Diagnosis not present

## 2018-02-03 DIAGNOSIS — L820 Inflamed seborrheic keratosis: Secondary | ICD-10-CM | POA: Diagnosis not present

## 2018-02-13 ENCOUNTER — Ambulatory Visit
Payer: Medicare Other | Attending: Student in an Organized Health Care Education/Training Program | Admitting: Student in an Organized Health Care Education/Training Program

## 2018-02-13 ENCOUNTER — Encounter: Payer: Self-pay | Admitting: Student in an Organized Health Care Education/Training Program

## 2018-02-13 ENCOUNTER — Encounter: Payer: Self-pay | Admitting: Family

## 2018-02-13 ENCOUNTER — Telehealth: Payer: Self-pay | Admitting: Family

## 2018-02-13 VITALS — BP 131/67 | HR 82 | Resp 16 | Ht 64.5 in | Wt 182.0 lb

## 2018-02-13 DIAGNOSIS — M4316 Spondylolisthesis, lumbar region: Secondary | ICD-10-CM

## 2018-02-13 DIAGNOSIS — Z7902 Long term (current) use of antithrombotics/antiplatelets: Secondary | ICD-10-CM | POA: Insufficient documentation

## 2018-02-13 DIAGNOSIS — Z96641 Presence of right artificial hip joint: Secondary | ICD-10-CM | POA: Diagnosis not present

## 2018-02-13 DIAGNOSIS — G459 Transient cerebral ischemic attack, unspecified: Secondary | ICD-10-CM | POA: Insufficient documentation

## 2018-02-13 DIAGNOSIS — Z79899 Other long term (current) drug therapy: Secondary | ICD-10-CM | POA: Diagnosis not present

## 2018-02-13 DIAGNOSIS — Z86010 Personal history of colonic polyps: Secondary | ICD-10-CM | POA: Diagnosis not present

## 2018-02-13 DIAGNOSIS — Z8673 Personal history of transient ischemic attack (TIA), and cerebral infarction without residual deficits: Secondary | ICD-10-CM | POA: Diagnosis not present

## 2018-02-13 DIAGNOSIS — F329 Major depressive disorder, single episode, unspecified: Secondary | ICD-10-CM | POA: Diagnosis not present

## 2018-02-13 DIAGNOSIS — E785 Hyperlipidemia, unspecified: Secondary | ICD-10-CM | POA: Insufficient documentation

## 2018-02-13 DIAGNOSIS — M1612 Unilateral primary osteoarthritis, left hip: Secondary | ICD-10-CM | POA: Diagnosis not present

## 2018-02-13 DIAGNOSIS — Z87891 Personal history of nicotine dependence: Secondary | ICD-10-CM | POA: Diagnosis not present

## 2018-02-13 DIAGNOSIS — Z9889 Other specified postprocedural states: Secondary | ICD-10-CM | POA: Diagnosis not present

## 2018-02-13 DIAGNOSIS — R918 Other nonspecific abnormal finding of lung field: Secondary | ICD-10-CM | POA: Diagnosis not present

## 2018-02-13 DIAGNOSIS — M961 Postlaminectomy syndrome, not elsewhere classified: Secondary | ICD-10-CM

## 2018-02-13 DIAGNOSIS — Z886 Allergy status to analgesic agent status: Secondary | ICD-10-CM | POA: Insufficient documentation

## 2018-02-13 DIAGNOSIS — K219 Gastro-esophageal reflux disease without esophagitis: Secondary | ICD-10-CM | POA: Insufficient documentation

## 2018-02-13 DIAGNOSIS — Z981 Arthrodesis status: Secondary | ICD-10-CM | POA: Diagnosis not present

## 2018-02-13 DIAGNOSIS — Z888 Allergy status to other drugs, medicaments and biological substances status: Secondary | ICD-10-CM | POA: Diagnosis not present

## 2018-02-13 DIAGNOSIS — Z885 Allergy status to narcotic agent status: Secondary | ICD-10-CM | POA: Diagnosis not present

## 2018-02-13 DIAGNOSIS — M5116 Intervertebral disc disorders with radiculopathy, lumbar region: Secondary | ICD-10-CM | POA: Insufficient documentation

## 2018-02-13 DIAGNOSIS — G894 Chronic pain syndrome: Secondary | ICD-10-CM | POA: Diagnosis not present

## 2018-02-13 NOTE — Progress Notes (Signed)
Safety precautions to be maintained throughout the outpatient stay will include: orient to surroundings, keep bed in low position, maintain call bell within reach at all times, provide assistance with transfer out of bed and ambulation.  

## 2018-02-13 NOTE — Telephone Encounter (Signed)
close

## 2018-02-13 NOTE — Progress Notes (Signed)
Patient's Name: Melody Ochoa  MRN: [id number]  Referring Provider: Meade Maw, MD  DOB: [birth year]  PCP: Burnard Hawthorne, FNP  DOS: 02/13/2018  Note by: Gillis Santa, MD  Service setting: Ambulatory outpatient  Specialty: Interventional Pain Management  Location: [facility] (AMB) Pain Management Facility  Visit type: Initial Patient Evaluation  Patient type: New Patient   Primary Reason(s) for Visit: Encounter for initial evaluation of one or more chronic problems (new to examiner) potentially causing chronic pain, and posing a threat to normal musculoskeletal function. (Level of risk: High) CC: Back Pain (lower lumbar worse on the left. )  HPI  Melody Ochoa is a 77 y.o. year old, female patient, who comes today to see [place] for the first time for an initial evaluation of her chronic pain. She has Spondylolisthesis of lumbar region; Back pain; UTI (lower urinary tract infection); Low back pain; Personal history of colonic polyps; Status post total replacement of right hip; Stroke (cerebrum) (HCC) - Patchy small volume acute ischemic nonhemorrhagic superior left cerebellar infarct, left SCA territory, of undetermined etiology, with diffuse atherosclerosis; Depression, recurrent ([place]); Pulmonary nodules; History of loop recorder; Atheromatous plaque; Hyperlipidemia; Rash; Lumbar radiculitis; History of stroke; Greater trochanteric bursitis of both hips; DDD (degenerative disc disease), lumbar; Major depression in remission ([place]); and Primary osteoarthritis of left hip on their problem list. Today she comes in for evaluation of her Back Pain (lower lumbar worse on the left. )  Pain Assessment: Location: Left, Lower(s/p hip replacement on the right and has no pain on that side) Back Radiating: into the groin on the left Onset: More than a month ago Duration: Chronic pain Quality: Burning, Discomfort, Sore Severity: 6 /10 (self-reported pain score)  Note: Reported level is inconsistent with  clinical observations.                         When using our objective Pain Scale, levels between 6 and 10/10 are said to belong in an emergency room, as it progressively worsens from a 6/10, described as severely limiting, requiring emergency care not usually available at an outpatient pain management facility. At a 6/10 level, communication becomes difficult and requires great effort. Assistance to reach the emergency department may be required. Facial flushing and profuse sweating along with potentially dangerous increases in heart rate and blood pressure will be evident. Effect on ADL: difficult to walk very far and unable to get household chores done.  Timing: Constant Modifying factors: nothing currently, rest  Onset and Duration: Present longer than 3 months Cause of pain: Unknown Severity: Getting worse, NAS-11 at its worse: 10/10, NAS-11 at its best: 6/10, NAS-11 now: 6/10 and NAS-11 on the average: 8/10 Timing: Not influenced by the time of the day Aggravating Factors: Twisting, Walking and Walking uphill Alleviating Factors: Lying down, Medications, Nerve blocks and Using a brace Associated Problems: Depression, Dizziness, Fatigue, Sweating and Pain that does not allow patient to sleep Quality of Pain: Aching, Burning, Disabling and Sharp Previous Examinations or Tests: CT scan, Endoscopy, MRI scan, X-rays, Neurological evaluation and Psychiatric evaluation Previous Treatments: Epidural steroid injections, Narcotic medications and Physical Therapy  The patient comes into the clinics today for the first time for a chronic pain management evaluation.   77 year old female with a history of posterior L3-L5 spinal fusion and decompression on 12/03/2016 who presents with axial low back pain that is worse on the left that radiates into her left groin and buttock.  She denies any  pain in her lower extremities.  She has minimal right-sided pain.  She is status post right hip replacement surgery.   Of note patient does have a history of a right TIA for which she is on Plavix 75 mg.  She states that she has some minor speech deficit and slight left facial droop as a result of the TIA.  Patient finds it difficult to ambulate for an extended period of time.  She states that she has noted worsening weakness since her last spinal fusion and decompression in December 2017.  Patient has a history of 3 lumbar spine surgeries.  Patient also has a history of cervical spine surgery.  Current medications include Wellbutrin 300 mg for depression, Cymbalta 60 mg daily, gabapentin 300 mg nightly.  Higher dose of gabapentin resulted in sedation, abnormal dreams.  Today I took the time to provide the patient with information regarding my pain practice. The patient was informed that my practice is divided into two sections: an interventional pain management section, as well as a completely separate and distinct medication management section. I explained that I have procedure days for my interventional therapies, and evaluation days for follow-ups and medication management. Because of the amount of documentation required during both, they are kept separated. This means that there is the possibility that she may be scheduled for a procedure on one day, and medication management the next. I have also informed her that because of staffing and facility limitations, I no longer take patients for medication management only. To illustrate the reasons for this, I gave the patient the example of surgeons, and how inappropriate it would be to refer a patient to his/her care, just to write for the post-surgical antibiotics on a surgery done by a different surgeon.   Because interventional pain management is my board-certified specialty, the patient was informed that joining my practice means that they are open to any and all interventional therapies. I made it clear that this does not mean that they will be forced to have any  procedures done. What this means is that I believe interventional therapies to be essential part of the diagnosis and proper management of chronic pain conditions. Therefore, patients not interested in these interventional alternatives will be better served under the care of a different practitioner.  The patient was also made aware of my Comprehensive Pain Management Safety Guidelines where by joining my practice, they limit all of their nerve blocks and joint injections to those done by our practice, for as long as we are retained to manage their care.   Historic Controlled Substance Pharmacotherapy Review  PMP and historical list of controlled substances: Oxycodone 5 mill grams, quantity 100, last fill 12/06/2016 MME/day: 83 mg/day Medications: The patient did not bring the medication(s) to the appointment, as requested in our "New Patient Package" Pharmacodynamics: Desired effects: Analgesia: The patient reports >50% benefit. Reported improvement in function: The patient reports medication allows her to accomplish basic ADLs. Clinically meaningful improvement in function (CMIF): Sustained CMIF goals met Perceived effectiveness: Described as relatively effective, allowing for increase in activities of daily living (ADL) Undesirable effects: Side-effects or Adverse reactions: None reported Historical Monitoring: The patient  reports that she does not use drugs. List of all UDS Test(s): Lab Results  Component Value Date   MDMA NONE DETECTED 08/09/2017   COCAINSCRNUR NONE DETECTED 08/09/2017   PCPSCRNUR NONE DETECTED 08/09/2017   THCU NONE DETECTED 08/09/2017   ETH <5 08/09/2017   List of other Serum/Urine Drug  Screening Test(s):  Lab Results  Component Value Date   COCAINSCRNUR NONE DETECTED 08/09/2017   THCU NONE DETECTED 08/09/2017   ETH <5 08/09/2017   Historical Background Evaluation: [place] PMP: Six (6) year initial data search conducted.             [place] Department of public  safety, offender search: Editor, commissioning Information) Non-contributory Risk Assessment Profile: Aberrant behavior: None observed or detected today Risk factors for fatal opioid overdose: None identified today Fatal overdose hazard ratio (HR): Calculation deferred Non-fatal overdose hazard ratio (HR): Calculation deferred Risk of opioid abuse or dependence: 0.7-3.0% with doses ? 36 MME/day and 6.1-26% with doses ? 120 MME/day. Substance use disorder (SUD) risk level: Low Opioid risk tool (ORT) (Total Score): 3 Opioid Risk Tool - 02/13/18 1257      Psychological Disease   Psychological Disease  Positive    ADD  Negative    OCD  Negative    Bipolar  Negative    Schizophrenia  Negative    Depression  Positive taking cymbalta and wellbutrin and feels this is managing it.  Sees psychiatrist in [place] who manages medication.    taking cymbalta and wellbutrin and feels this is managing it.  Sees psychiatrist in [place] who manages medication.      Total Score   Opioid Risk Tool Scoring  3    Opioid Risk Interpretation  Low Risk      ORT Scoring interpretation table:  Score <3 = Low Risk for SUD  Score between 4-7 = Moderate Risk for SUD  Score >8 = High Risk for Opioid Abuse   PHQ-2 Depression Scale:  Total score: 0  PHQ-2 Scoring interpretation table: (Score and probability of major depressive disorder)  Score 0 = No depression  Score 1 = 15.4% Probability  Score 2 = 21.1% Probability  Score 3 = 38.4% Probability  Score 4 = 45.5% Probability  Score 5 = 56.4% Probability  Score 6 = 78.6% Probability   PHQ-9 Depression Scale:  Total score: 0  PHQ-9 Scoring interpretation table:  Score 0-4 = No depression  Score 5-9 = Mild depression  Score 10-14 = Moderate depression  Score 15-19 = Moderately severe depression  Score 20-27 = Severe depression (2.4 times higher risk of SUD and 2.89 times higher risk of overuse)   Pharmacologic Plan: As per protocol, I have not taken over any  controlled substance management, pending the results of ordered tests and/or consults.            Initial impression: Pending review of available data and ordered tests.  Meds   Current Outpatient Medications:  .  atorvastatin (LIPITOR) 80 MG tablet, Take 1 tablet (80 mg total) daily at 6 PM by mouth., Disp: 90 tablet, Rfl: 3 .  buPROPion (WELLBUTRIN XL) 300 MG 24 hr tablet, Take 300 mg by mouth daily. , Disp: , Rfl:  .  Calcium Carbonate-Vitamin D (CALTRATE 600+D PO), Take 1 tablet by mouth daily., Disp: , Rfl:  .  clopidogrel (PLAVIX) 75 MG tablet, Take 1 tablet (75 mg total) by mouth daily., Disp: 30 tablet, Rfl: 0 .  Cyanocobalamin (VITAMIN B-12 PO), Take 1 tablet by mouth daily., Disp: , Rfl:  .  DULoxetine (CYMBALTA) 60 MG capsule, Take 60 mg by mouth daily. , Disp: , Rfl:  .  ferrous sulfate 325 (65 FE) MG tablet, Take 1 tablet (325 mg total) by mouth daily., Disp: 30 tablet, Rfl: 0 .  gabapentin (NEURONTIN) 300 MG capsule, Take  300 mg by mouth at bedtime. , Disp: , Rfl: 3 .  mupirocin ointment (BACTROBAN) 2 %, Apply 1 application topically 2 (two) times daily., Disp: 22 g, Rfl: 1 .  triamcinolone cream (KENALOG) 0.1 %, Apply 1 application topically as needed., Disp: , Rfl: 0 .  vitamin C (ASCORBIC ACID) 500 MG tablet, Take 500 mg by mouth daily., Disp: , Rfl:   Imaging Review  Cervical Imaging:   Lumbosacral Imaging: Lumbar MR wo contrast:  Results for orders placed during the hospital encounter of 11/21/14  MR Lumbar Spine Wo Contrast   Narrative CLINICAL DATA:  Severe low back pain. Fever and incontinence. Surgery lumbar surgery 11/18/2014.  EXAM: MRI LUMBAR SPINE WITHOUT CONTRAST  TECHNIQUE: Multiplanar, multisequence MR imaging of the lumbar spine was performed. No intravenous contrast was administered.  COMPARISON:  09/16/2014  FINDINGS: The examination had to be discontinued prior to completion due to patient pain and inability to tolerate further imaging.  Sagittal T1, T2, and STIR and axial T2 weighted images were obtained.  Vertebral alignment is unchanged without significant listhesis. Vertebral body heights are preserved. Sequelae of interval L4-5 PLIF are identified. There is diffuse lumbar disc desiccation. Multiple small Schmorl's nodes are again seen. Conus medullaris is normal in signal and terminates at L1. Small T2 hyperintense renal lesions are similar to the prior study and may represent cysts. 2.2 cm left adrenal nodule is unchanged.  T10-11 and T11-12: Only imaged sagittally. Mild disc bulging without evidence of significant spinal canal stenosis. Mild right neural foraminal narrowing at T10-11.  T12-L1:  Mild disc bulging without stenosis, unchanged.  L1-2:  Mild disc bulging without stenosis, unchanged.  L2-3: Mild disc bulging and mild facet hypertrophy without stenosis, unchanged.  L3-4: Interval bilateral laminotomies. Unchanged circumferential disc bulging and facet hypertrophy contributing to spinal canal and lateral recess narrowing as well as mild-to-moderate left neural foraminal stenosis. Superior extension of postoperative fluid collection at L4-5 contributes to increased spinal stenosis at and just below the disc space level, further described below.  L4-5: Interval wide posterior decompression and posterior and interbody fusion. Fluid collection at the laminectomy site measures approximately 2.4 x 2.5 x 4.6 cm (transverse x AP x craniocaudal). There is a 3.0 x 1.3 x 1.5 cm oblong markedly T2 hypointense focus at the anterior aspect of the fluid collection with complete thecal sac effacement at this level. Neural foramina are not well evaluated due to artifact and postsurgical changes. Postoperative fluid collection in the superficial soft tissues at the incision measures 3.8 x 1.5 x 8.6 cm. There is edema diffusely within the posterior paraspinal soft tissues.  L5-S1: Severe right and moderate left  facet arthrosis with slightly increased size of small right facet joint effusion. No definite progressive marrow edema involving the facets. Disc bulging and facet disease results in moderate right and mild left neural foraminal stenosis without spinal stenosis, unchanged.  IMPRESSION: 1. Interval posterior decompression and fusion at L4-5. 4.6 cm postoperative fluid collection at the laminectomy site with additional 3.8 cm low signal focus along its anterior margin. This is nonspecific, with considerations including retracted hematoma, gas, and possibly residual osseous fragments. Further evaluation with CT is suggested. There is resultant complete thecal sac effacement at L4-5. Fluid collection results in increased spinal canal narrowing at L3-4 as well. Superimposed infection is not excluded. 2. Severe facet arthrosis at L5-S1 with slightly increased facet joint effusion on the right.   Electronically Signed   By: Logan Bores   On: 11/22/2014  07:35     Lumbar MR w/wo contrast:  Results for orders placed during the hospital encounter of 09/06/16  MR Lumbar Spine W Wo Contrast   Narrative CLINICAL DATA:  Prior surgery 1 year ago, low back and LEFT leg pain.  EXAM: MRI LUMBAR SPINE WITHOUT AND WITH CONTRAST  TECHNIQUE: Multiplanar and multiecho pulse sequences of the lumbar spine were obtained without and with intravenous contrast.  CONTRAST:  MultiHance 18 mL.  COMPARISON:  02/26/2015  FINDINGS: Segmentation:  Standard.  Alignment:  Anatomic  Vertebrae: Prior PLIF L4-5. Solid arthrodesis not established. No worrisome osseous lesion. Endplate reactive changes L2-L3. No concerning postcontrast enhancement.  Conus medullaris: Extends to the L1 level and appears normal.  Paraspinal and other soft tissues: Unremarkable  Disc levels:  L1-L2:  Annular bulge.  Mild facet hypertrophy.  No impingement.  L2-L3: Annular bulge centrally. Foraminal and  extraforaminal protrusion on the LEFT. Facet arthropathy. LEFT L2 and LEFT L3 nerve root impingement likely.  L3-L4: Disc space narrowing. Central extrusion. Advanced posterior element hypertrophy affecting facets and ligamentum flavum. Moderate to severe stenosis. Disc material extends to both neural foramina. BILATERAL L4 and L3 nerve root impingement, worse on the LEFT.  L4-L5: Adequate posterior decompression. No definite residual impingement. Solid arthrodesis not established.  L5-S1: Annular bulge. Severe facet arthropathy, worse on the RIGHT. Disc material extends into the RIGHT neural foramen where nodular hypertrophy of the RIGHT ligamentum flavum measuring 5 mm could represent synovial cyst. RIGHT L5 and RIGHT S1 nerve root impingement are possible.  Compared with priors, the previously noted dorsal fluid collection has resolved. Adjacent segment disease at L3-4 and L2-3 is worse.  IMPRESSION: Previous L4-5 PLIF. Solid arthrodesis not established. Consider CT lumbar spine without contrast to further assess.  Resolved postoperative fluid collection from 2016. No worrisome postcontrast enhancement to suggest infection.  Adjacent segment disease at L3-4 has progressed from 2016. Posterior element hypertrophy and central extrusion contribute to BILATERAL L3 and L4 nerve root impingement, worse on the LEFT.  New foraminal and extraforaminal protrusion on the LEFT at L2-L3 could contribute also to LEFT leg pain.   Electronically Signed   By: Staci Righter M.D.   On: 09/06/2016 11:33     Lumbar DG 1V:  Results for orders placed during the hospital encounter of 11/18/14  DG Lumbar Spine 1 View   Narrative CLINICAL DATA:  Lumbar spine surgery.  Spondylolisthesis.  EXAM: LUMBAR SPINE - 1 VIEW  COMPARISON:  MRI 09/16/2014  FINDINGS: Single intraoperative image demonstrates surgical marking along the posterior aspect of L5-S1.  IMPRESSION: Surgical marking at  L5-S1.   Electronically Signed   By: Markus Daft M.D.   On: 11/18/2014 14:59     Lumbar DG 2-3 views:  Results for orders placed during the hospital encounter of 11/18/14  DG Lumbar Spine 2-3 Views   Narrative CLINICAL DATA:  L4-5 PLIF  EXAM: LUMBAR SPINE - 2-3 VIEW; DG C-ARM 61-120 MIN  COMPARISON:  None.  FINDINGS: Two intraoperative fluoroscopic spot images of the lumbar spine are provided. Interval PLIF at L4-5.  IMPRESSION: PLIF at L4-5.   Electronically Signed   By: Kathreen Devoid   On: 11/18/2014 16:36    Lumbar DG (Complete) 4+V:  Results for orders placed during the hospital encounter of 11/21/14  DG Lumbar Spine Complete   Narrative CLINICAL DATA:  Recent lumbar fusion with constipation. Lower back pain.  EXAM: LUMBAR SPINE - COMPLETE 4+ VIEW  COMPARISON:  11/18/2014 fluoroscopy  FINDINGS:  Recent L4-5 posterior lumbar interbody fusion with rod and pedicle screw fixation. Vertebral body heights are maintained - no evidence of acute fracture. The intervertebral graft remains in good position. The rod and pedicle screws are located.  Degenerative endplate spurring throughout the lumbar spine. No endplate erosion.  Osteopenia.  IMPRESSION: L4-5 PLIF with rod and pedicle screw fixation.  No adverse findings.   Electronically Signed   By: Jorje Guild M.D.   On: 11/22/2014 00:24     Hip-R MR wo contrast:  Results for orders placed during the hospital encounter of 04/07/16  MR Hip Right Wo Contrast   Narrative CLINICAL DATA:  Bilateral hip pain for 1 year, left worse than right. No known injury.  EXAM: MR OF THE RIGHT HIP WITHOUT CONTRAST  TECHNIQUE: Multiplanar, multisequence MR imaging was performed. No intravenous contrast was administered.  COMPARISON:  Initial encounter.  FINDINGS: Bones: No fracture, avascular necrosis or worrisome marrow lesion is identified. Osteophytosis about the femoral heads is much worse on the right.  Small subchondral cysts are seen in the left acetabular roof. Artifact from lower lumbar fusion hardware is noted  Articular cartilage and labrum  Articular cartilage: Severely degenerated with associated joint space narrowing on the right. Milder degree of degenerative change on the left is identified.  Labrum: The right labrum is diffusely degenerated and torn. A right paralabral cyst at the 12 o'clock position measures 1.3 cm in diameter.  Joint or bursal effusion  Joint effusion:  None.  Bursae: Fluid is present in the trochanter bursae, greater on the left.  Muscles and tendons  Muscles and tendons:  Intact.  Other findings  Miscellaneous: Imaged intrapelvic contents demonstrate extensive sigmoid diverticulosis.  IMPRESSION: Right worse than left hip osteoarthritis. Associated labral tearing on the right with paralabral cyst formation is noted.  Bilateral trochanteric bursitis appears worse on the left.  Sigmoid diverticulosis.   Electronically Signed   By: Inge Rise M.D.   On: 04/07/2016 14:54     Complexity Note: Imaging results reviewed. Results shared with Melody Ochoa, using Layman's terms.                         ROS  Cardiovascular: Heart trouble and Blood thinners:  Anticoagulant Pulmonary or Respiratory: No reported pulmonary signs or symptoms such as wheezing and difficulty taking a deep full breath (Asthma), difficulty blowing air out (Emphysema), coughing up mucus (Bronchitis), persistent dry cough, or temporary stoppage of breathing during sleep Neurological: Stroke (Residual deficits or weakness: none noted) Review of Past Neurological Studies:  Results for orders placed or performed during the hospital encounter of 08/10/17  MR BRAIN WO CONTRAST   Narrative   CLINICAL DATA:  Initial evaluation for acute dizziness, status post tPA. The  EXAM: MRI HEAD WITHOUT CONTRAST  TECHNIQUE: Multiplanar, multiecho pulse sequences of the  brain and surrounding structures were obtained without intravenous contrast.  COMPARISON:  Comparison made with prior CTA from 08/09/2017.  FINDINGS: Brain: Generalized age-related cerebral atrophy. Encephalomalacia with gliosis within the left parietal lobe consistent with remote left posterior MCA territory infarct. Small remote left cerebellar infarct noted as well. Mild for age chronic microvascular ischemic disease.  Patchy small volume restricted diffusion within the superior left cerebellar hemisphere, consistent with acute ischemic infarct, left superior cerebellar artery territory (series 3, image 14). Involvement of the cerebellar vermis. No associated mass effect or hemorrhage. No other evidence for acute ischemia.  Single punctate chronic microhemorrhage noted within the  right occipital lobe, of doubtful significance in isolation. Minimal chronic blood products noted within the right frontal lobe. Chronic hemosiderin staining present with the remote left parietal infarct. No evidence for acute intracranial hemorrhage.  No mass lesion, midline shift or mass effect. No hydrocephalus. No extra-axial fluid collection. Major dural sinuses are patent.  Pituitary gland suprasellar region within normal limits. Midline structures intact and normal.  Vascular: Major intracranial vascular flow voids are maintained.  Skull and upper cervical spine: Craniocervical junction within normal limits. Visualized upper cervical spine unremarkable. Bone marrow signal intensity within normal limits. No scalp soft tissue abnormality.  Sinuses/Orbits: Globes and orbital soft tissues within normal limits. Paranasal sinuses are clear. Trace left mastoid effusion. Inner ear structures normal.  Other: None.  IMPRESSION: 1. Patchy small volume acute ischemic nonhemorrhagic superior left cerebellar infarct, left SCA territory. 2. Remote left parietal and left cerebellar infarcts. 3. Mild  chronic microvascular ischemic disease.   Electronically Signed   By: Jeannine Boga M.D.   On: 08/10/2017 16:19    Psychological-Psychiatric: Depressed Gastrointestinal: No reported gastrointestinal signs or symptoms such as vomiting or evacuating blood, reflux, heartburn, alternating episodes of diarrhea and constipation, inflamed or scarred liver, or pancreas or irrregular and/or infrequent bowel movements Genitourinary: No reported renal or genitourinary signs or symptoms such as difficulty voiding or producing urine, peeing blood, non-functioning kidney, kidney stones, difficulty emptying the bladder, difficulty controlling the flow of urine, or chronic kidney disease Hematological: Brusing easily Endocrine: No reported endocrine signs or symptoms such as high or low blood sugar, rapid heart rate due to high thyroid levels, obesity or weight gain due to slow thyroid or thyroid disease Rheumatologic: Joint aches and or swelling due to excess weight (Osteoarthritis) Musculoskeletal: Negative for myasthenia gravis, muscular dystrophy, multiple sclerosis or malignant hyperthermia Work History: Retired  Allergies  Melody Ochoa is allergic to [employer] sodium]; aspirin; celebrex [celecoxib]; effexor [venlafaxine]; hydrocodone-homatropine; ibuprofen; tape; vioxx [rofecoxib]; other; and tramadol.  Laboratory Chemistry  Inflammation Markers (CRP: Acute Phase) (ESR: Chronic Phase) No results found for: CRP, ESRSEDRATE, LATICACIDVEN                       Rheumatology Markers No results found for: RF, ANA, [facility], LYMEIGGIGMAB, [facility]              Renal Function Markers Lab Results  Component Value Date   BUN 10 08/19/2017   CREATININE 1.01 (H) 08/19/2017   GFRAA >60 08/19/2017   GFRNONAA 52 (L) 08/19/2017                 Hepatic Function Markers Lab Results  Component Value Date   AST 21 08/09/2017   ALT 17 08/09/2017   ALBUMIN 4.3 08/09/2017   ALKPHOS  59 08/09/2017                 Electrolytes Lab Results  Component Value Date   NA 140 08/19/2017   K 3.7 08/19/2017   CL 104 08/19/2017   CALCIUM 9.3 08/19/2017                        Neuropathy Markers Lab Results  Component Value Date   VITAMINB12 334 11/23/2014   FOLATE >20.0 11/23/2014   HGBA1C 5.3 08/10/2017                 Bone Pathology Markers No results found for: [place], [place], [id number], [id number], [place], 25OHVITD2, 25OHVITD3, TESTOFREE, TESTOSTERONE  Coagulation Parameters Lab Results  Component Value Date   INR 0.91 08/09/2017   LABPROT 12.2 08/09/2017   APTT 27 08/09/2017   PLT 318 08/19/2017                 Cardiovascular Markers Lab Results  Component Value Date   TROPONINI <0.03 08/19/2017   HGB 11.2 (L) 08/19/2017   HCT 32.9 (L) 08/19/2017                 CA Markers No results found for: CEA, CA125, LABCA2               Note: Lab results reviewed.  [place]  Drug: Melody Ochoa  reports that she does not use drugs. Alcohol:  reports that she drinks alcohol. Tobacco:  reports that she quit smoking about 21 years ago. Her smoking use included cigarettes. She has a 40.00 pack-year smoking history. she has never used smokeless tobacco. Medical:  has a past medical history of Arthritis, Atheromatous plaque (10/18/2017), Back pain, Cancer ([place]), Depression, Diverticulosis, Dizziness, GERD (gastroesophageal reflux disease), Heart murmur, Hyperlipidemia (10/18/2017), PONV (postoperative nausea and vomiting), and Stroke ([place]). Family: family history is not on file.  Past Surgical History:  Procedure Laterality Date  . BACK SURGERY  [place] [facility]  09  . COLONOSCOPY WITH PROPOFOL N/A 01/13/2016   Procedure: COLONOSCOPY WITH PROPOFOL;  Surgeon: [name], MD;  Location: [facility] ENDOSCOPY;  Service: Endoscopy;  Laterality: N/A;  . FACELIFT  94  . LOOP RECORDER INSERTION N/A 08/23/2017   Procedure: LOOP RECORDER  INSERTION;  Surgeon: Thompson Grayer, MD;  Location: [place] [facility];  Service: Cardiovascular;  Laterality: N/A;  . TEE WITHOUT CARDIOVERSION N/A 08/12/2017   Procedure: TRANSESOPHAGEAL ECHOCARDIOGRAM (TEE);  Surgeon: Sueanne Margarita, MD;  Location: [facility] ENDOSCOPY;  Service: Cardiovascular;  Laterality: N/A;  . TONSILLECTOMY    . TOTAL HIP ARTHROPLASTY Right 04/22/2016   Procedure: TOTAL HIP ARTHROPLASTY;  Surgeon: Corky Mull, MD;  Location: [facility] ORS;  Service: Orthopedics;  Laterality: Right;   Active Ambulatory Problems    Diagnosis Date Noted  . Spondylolisthesis of lumbar region 11/18/2014  . Back pain 11/22/2014  . UTI (lower urinary tract infection) 11/22/2014  . Low back pain 11/22/2014  . Personal history of colonic polyps   . Status post total replacement of right hip 04/22/2016  . Stroke (cerebrum) (HCC) - Patchy small volume acute ischemic nonhemorrhagic superior left cerebellar infarct, left SCA territory, of undetermined etiology, with diffuse atherosclerosis 08/10/2017  . Depression, recurrent ([place]) 09/27/2017  . Pulmonary nodules 09/27/2017  . History of loop recorder 10/04/2017  . Atheromatous plaque 10/18/2017  . Hyperlipidemia 10/18/2017  . Rash 01/16/2018  . Lumbar radiculitis 08/21/2014  . History of stroke 09/12/2017  . Greater trochanteric bursitis of both hips 01/09/2016  . DDD (degenerative disc disease), lumbar 08/21/2014  . Major depression in remission ([place]) 06/23/2014  . Primary osteoarthritis of left hip 02/28/2017   Resolved Ambulatory Problems    Diagnosis Date Noted  . Sepsis ([place]) 11/22/2014  . Fever 11/22/2014  . Normocytic anemia 11/22/2014   Past Medical History:  Diagnosis Date  . Arthritis   . Atheromatous plaque 10/18/2017  . Back pain   . Cancer ([place])   . Depression   . Diverticulosis   . Dizziness   . GERD (gastroesophageal reflux disease)   . Heart murmur   . Hyperlipidemia 10/18/2017  . PONV (postoperative nausea and vomiting)    . Stroke (  [place])    Constitutional Exam  General appearance: Well nourished, well developed, and well hydrated. In no apparent acute distress Vitals:   02/13/18 1243  BP: 131/67  Pulse: 82  Resp: 16  SpO2: 100%  Weight: 182 lb (82.6 kg)  Height: 5' 4.5" (1.638 m)   BMI Assessment: Estimated body mass index is 30.76 kg/m as calculated from the following:   Height as of this encounter: 5' 4.5" (1.638 m).   Weight as of this encounter: 182 lb (82.6 kg).  BMI interpretation table: BMI level Category Range association with higher incidence of chronic pain  <18 kg/m2 Underweight   18.5-24.9 kg/m2 Ideal body weight   25-29.9 kg/m2 Overweight Increased incidence by 20%  30-34.9 kg/m2 Obese (Class I) Increased incidence by 68%  35-39.9 kg/m2 Severe obesity (Class II) Increased incidence by 136%  >40 kg/m2 Extreme obesity (Class III) Increased incidence by 254%   BMI Readings from Last 4 Encounters:  02/13/18 30.76 kg/m  01/16/18 33.81 kg/m  10/18/17 33.81 kg/m  09/27/17 34.12 kg/m   Wt Readings from Last 4 Encounters:  02/13/18 182 lb (82.6 kg)  01/16/18 197 lb (89.4 kg)  10/18/17 197 lb (89.4 kg)  09/27/17 198 lb 12.8 oz (90.2 kg)  Psych/Mental status: Alert, oriented x 3 (person, place, & time)       Eyes: PERLA Respiratory: No evidence of acute respiratory distress  Cervical Spine Area Exam  Skin & Axial Inspection: Well healed scar from previous spine surgery detected Alignment: Symmetrical Functional ROM: Unrestricted ROM      Stability: No instability detected Muscle Tone/Strength: Functionally intact. No obvious neuro-muscular anomalies detected. Sensory (Neurological): Unimpaired Palpation: No palpable anomalies              Upper Extremity (UE) Exam    Side: Right upper extremity  Side: Left upper extremity  Skin & Extremity Inspection: Skin color, temperature, and hair growth are WNL. No peripheral edema or cyanosis. No masses, redness, swelling, asymmetry,  or associated skin lesions. No contractures.  Skin & Extremity Inspection: Skin color, temperature, and hair growth are WNL. No peripheral edema or cyanosis. No masses, redness, swelling, asymmetry, or associated skin lesions. No contractures.  Functional ROM: Unrestricted ROM          Functional ROM: Unrestricted ROM          Muscle Tone/Strength: Functionally intact. No obvious neuro-muscular anomalies detected.  Muscle Tone/Strength: Functionally intact. No obvious neuro-muscular anomalies detected.  Sensory (Neurological): Unimpaired          Sensory (Neurological): Unimpaired          Palpation: No palpable anomalies              Palpation: No palpable anomalies              Specialized Test(s): Deferred         Specialized Test(s): Deferred          Thoracic Spine Area Exam  Skin & Axial Inspection: No masses, redness, or swelling Alignment: Symmetrical Functional ROM: Unrestricted ROM Stability: No instability detected Muscle Tone/Strength: Functionally intact. No obvious neuro-muscular anomalies detected. Sensory (Neurological): Unimpaired Muscle strength & Tone: No palpable anomalies  Lumbar Spine Area Exam  Skin & Axial Inspection: Well healed scar from previous spine surgery detected Alignment: Symmetrical Functional ROM: Decreased ROM, to the left Stability: No instability detected Muscle Tone/Strength: Functionally intact. No obvious neuro-muscular anomalies detected. Sensory (Neurological): Articular pain pattern Palpation: Complains of area being tender to palpation  Bilateral Fist Percussion Test Provocative Tests: Lumbar Hyperextension and rotation test: Positive bilaterally for facet joint pain. Lumbar Lateral bending test: Positive ipsilateral radicular pain, on the left. Positive for left-sided foraminal stenosis. Patrick's Maneuver: Positive for left-sided S-I arthralgia              Gait & Posture Assessment  Ambulation: Patient ambulates using a cane Gait:  Antalgic Posture: Difficulty standing up straight, due to pain   Lower Extremity Exam    Side: Right lower extremity  Side: Left lower extremity  Skin & Extremity Inspection: Skin color, temperature, and hair growth are WNL. No peripheral edema or cyanosis. No masses, redness, swelling, asymmetry, or associated skin lesions. No contractures.  Skin & Extremity Inspection: Skin color, temperature, and hair growth are WNL. No peripheral edema or cyanosis. No masses, redness, swelling, asymmetry, or associated skin lesions. No contractures.  Functional ROM: Unrestricted ROM          Functional ROM: Unrestricted ROM          Muscle Tone/Strength: Functionally intact. No obvious neuro-muscular anomalies detected.  Muscle Tone/Strength: S1 weakness  Sensory (Neurological): Unimpaired  Sensory (Neurological): Arthropathic arthralgia  Palpation: No palpable anomalies  Palpation: No palpable anomalies   Assessment  Primary Diagnosis & Pertinent Problem List: The primary encounter diagnosis was Spondylolisthesis of lumbar region. Diagnoses of Chronic pain syndrome, Status post total replacement of right hip, Primary osteoarthritis of left hip, Failed back surgical syndrome, and History of lumbar surgery were also pertinent to this visit.  Visit Diagnosis (New problems to examiner): 1. Spondylolisthesis of lumbar region   2. Chronic pain syndrome   3. Status post total replacement of right hip   4. Primary osteoarthritis of left hip   5. Failed back surgical syndrome   6. History of lumbar surgery   General Recommendations: The pain condition that the patient suffers from is best treated with a multidisciplinary approach that involves an increase in physical activity to prevent de-conditioning and worsening of the pain cycle, as well as psychological counseling (formal and/or informal) to address the co-morbid psychological affects of pain. Treatment will often involve judicious use of pain medications and  interventional procedures to decrease the pain, allowing the patient to participate in the physical activity that will ultimately produce long-lasting pain reductions. The goal of the multidisciplinary approach is to return the patient to a higher level of overall function and to restore their ability to perform activities of daily living.  77 year old female with a history of posterior L3-L5 spinal fusion and decompression on 12/03/2016 who presents with axial low back pain that is worse on the left that radiates into her left groin and buttock.  She denies any pain in her lower extremities.  She has minimal right-sided pain.  She is status post right hip replacement surgery.  Of note patient does have a history of a right TIA for which she is on Plavix 75 mg.  She states that she has some minor speech deficit and slight left facial droop as a result of the TIA.  Patient finds it difficult to ambulate for an extended period of time.  She states that she has noted worsening weakness since her last spinal fusion and decompression in December 2017.  Patient has a history of 3 lumbar spine surgeries.  Patient also has a history of cervical spine surgery.  Patient is scheduled for CT of her lumbar spine on 02/15/2018.  Her previous lumbar MRI shows left L2/L3 radiculopathy which could  be contributing to her left axial low back, groin, hip pain.  It also shows severe facet arthropathy at L4-L5 and L5-S1 on the right.  Patient does have  and extraforaminal disc protrusion at L2-L3.  We discussed doing a lumbar epidural steroid injection at L2-L3 to help with her low back, buttock and groin pain symptoms.  We also discussed lumbar facet medial branch nerve blocks for lumbar facet arthropathy.  Patient also has physical exam findings suggestive of SI joint arthralgia on the left.  We discussed doing an SI joint injection on the left.  Prior to scheduling the procedure, I would like to see the patient's lumbar spine CT  which will be done in a couple of days.  In regards to medication management, I have instructed the patient to continue Wellbutrin, Cymbalta, gabapentin.  Higher doses of gabapentin resulted in sedation so we will continue at this current dose.  We will obtain a urine drug screen today.  Expect this to be negative for any illicit substances.  Patient will be a suitable candidate for low-dose opioid therapy with hydrocodone 5 mg twice daily as needed, quantity 61-month  If UDS appropriate will have patient sign opiate contract at next visit.  Plan:  -UDS today -Proceed with CT of lumbar spine.  Will discuss results and treatment plan next week.  Interventional options (patient must stop Plavix 7 days prior to scheduled procedure):  -Left L2-L3 epidural steroid injection, left L2 transforaminal epidural steroid injection, left L3 transforaminal epidural steroid injection. -Left L3, L4, L5 facet medial branch nerve block -Left SI joint injection for SI joint arthralgia  Note: Please be advised that as per protocol, today's visit has been an evaluation only. We have not taken over the patient's controlled substance management.  Ordered Lab-work, Procedure(s), Referral(s), & Consult(s): Orders Placed This Encounter  Procedures  . Compliance Drug Analysis, Ur    Pharmacological management options:  Opioid Analgesics: The patient was informed that there is no guarantee that she would be a candidate for opioid analgesics. The decision will be made following CDC guidelines. This decision will be based on the results of diagnostic studies, as well as Melody Ochoa's risk profile.   Membrane stabilizer: To be determined at a later time continue gabapentin 300 mg nightly  Muscle relaxant: To be determined at a later time consider Flexeril, baclofen, tizanidine  NSAID: To be determined at a later time avoid  Other analgesic(s): To be determined at a later time   Interventional management options: Ms.  MCieslawas informed that there is no guarantee that she would be a candidate for interventional therapies. The decision will be based on the results of diagnostic studies, as well as Melody Ochoa's risk profile.  Procedure(s) under consideration (patient on Plavix, must stop 7 days prior to any scheduled interventional procedure):  -Left L2-L3 epidural steroid injection, left L2 transforaminal epidural steroid injection, left L3 transforaminal epidural steroid injection. -Left L3, L4, L5 facet medial branch nerve block -Left SI joint injection for SI joint arthralgia   Provider-requested follow-up: Return in about 10 days (around 02/23/2018) for Medication Management, After Imaging.  Future Appointments  Date Time Provider DMoshannon 02/14/2018  2:00 PM O'Brien-Blaney, DBryson Corona LPN [facility]  [id number] 9:00 AM [facility]  02/22/2018 12:00 PM [facility] DEVICE REMOTES [facility]  02/23/2018 12:15 PM LGillis Santa MD [facility] None  02/23/2018  3:00 PM [facility] 03/29/2018  1:30 PM Arnett, MYvetta Coder FNP [id number]    Primary Care Physician: Burnard Hawthorne, FNP Location: [facility] Note by: Gillis Santa, M.D, Date: 02/13/2018; Time: 1:50 PM  There are no Patient Instructions on file for this visit.

## 2018-02-14 ENCOUNTER — Ambulatory Visit (INDEPENDENT_AMBULATORY_CARE_PROVIDER_SITE_OTHER): Payer: Medicare Other

## 2018-02-14 VITALS — BP 132/70 | HR 80 | Temp 98.0°F | Resp 16 | Ht 64.0 in | Wt 198.0 lb

## 2018-02-14 DIAGNOSIS — Z23 Encounter for immunization: Secondary | ICD-10-CM

## 2018-02-14 DIAGNOSIS — E2839 Other primary ovarian failure: Secondary | ICD-10-CM

## 2018-02-14 DIAGNOSIS — Z0000 Encounter for general adult medical examination without abnormal findings: Secondary | ICD-10-CM

## 2018-02-14 NOTE — Progress Notes (Signed)
Subjective:   Melody Ochoa is a 77 y.o. female who presents for an Initial Medicare Annual Wellness Visit.  Review of Systems    No ROS.  Medicare Wellness Visit. Additional risk factors are reflected in the social history.  Cardiac Risk Factors include: advanced age (>40men, >69 women)     Objective:    Today's Vitals   02/14/18 1426 02/14/18 1435  BP: 132/70   Pulse: 80   Resp: 16   Temp: 98 F (36.7 C)   TempSrc: Oral   SpO2: 95%   Weight: 198 lb (89.8 kg)   Height: 5\' 4"  (1.626 m)   PainSc:  5    Body mass index is 33.99 kg/m.  Advanced Directives 02/14/2018 08/30/2017 08/19/2017 08/12/2017 08/10/2017 08/09/2017 07/24/2017  Does Patient Have a Medical Advance Directive? Yes Yes Yes Yes Yes Yes Yes  Type of Paramedic of [place];Living will [place];Living will - [place];Living will Living will;Healthcare Power of Attorney Living will;Healthcare Power of [place];Living will  Does patient want to make changes to medical advance directive? No - Patient declined No - Patient declined - - No - Patient declined - -  Copy of [place] in Chart? Yes - - No - copy requested No - copy requested - -  Would patient like information on creating a medical advance directive? - - - - - - -    Current Medications (verified) Outpatient Encounter Medications as of 02/14/2018  Medication Sig  . atorvastatin (LIPITOR) 80 MG tablet Take 1 tablet (80 mg total) daily at 6 PM by mouth.  Marland Kitchen buPROPion (WELLBUTRIN XL) 300 MG 24 hr tablet Take 300 mg by mouth daily.   . Calcium Carbonate-Vitamin D (CALTRATE 600+D PO) Take 1 tablet by mouth daily.  . clopidogrel (PLAVIX) 75 MG tablet Take 1 tablet (75 mg total) by mouth daily.  . Cyanocobalamin (VITAMIN B-12 PO) Take 1 tablet by mouth daily.  . DULoxetine (CYMBALTA) 60 MG capsule Take 60 mg by mouth daily.   . ferrous sulfate 325 (65 FE)  MG tablet Take 1 tablet (325 mg total) by mouth daily.  Marland Kitchen gabapentin (NEURONTIN) 300 MG capsule Take 300 mg by mouth at bedtime.   . mupirocin ointment (BACTROBAN) 2 % Apply 1 application topically 2 (two) times daily.  Marland Kitchen triamcinolone cream (KENALOG) 0.1 % Apply 1 application topically as needed.  . vitamin C (ASCORBIC ACID) 500 MG tablet Take 500 mg by mouth daily.   No facility-administered encounter medications on file as of 02/14/2018.     Allergies (verified) Aleve [naproxen sodium]; Aspirin; Celebrex [celecoxib]; Effexor [venlafaxine]; Hydrocodone-homatropine; Ibuprofen; Tape; Vioxx [rofecoxib]; Other; and Tramadol   History: Past Medical History:  Diagnosis Date  . Arthritis   . Atheromatous plaque 10/18/2017  . Back pain   . Cancer ([place])    skin  . Depression   . Diverticulosis   . Dizziness    patient had episode of dizziness when came in room. hx past no dx  . GERD (gastroesophageal reflux disease)   . Heart murmur   . Hyperlipidemia 10/18/2017  . PONV (postoperative nausea and vomiting)    yrs ago  . Stroke [facility])    Past Surgical History:  Procedure Laterality Date  . BACK SURGERY  [place] SURGERY  09  . COLONOSCOPY WITH PROPOFOL N/A 01/13/2016   Procedure: COLONOSCOPY WITH PROPOFOL;  Surgeon: [name], MD;  Location: [facility] ENDOSCOPY;  Service: Endoscopy;  Laterality: N/A;  . FACELIFT  94  . LOOP RECORDER INSERTION N/A 08/23/2017   Procedure: LOOP RECORDER INSERTION;  Surgeon: Thompson Grayer, MD;  Location: [facility] CV LAB;  Service: Cardiovascular;  Laterality: N/A;  . TEE WITHOUT CARDIOVERSION N/A 08/12/2017   Procedure: TRANSESOPHAGEAL ECHOCARDIOGRAM (TEE);  Surgeon: Sueanne Margarita, MD;  Location: [facility] ENDOSCOPY;  Service: Cardiovascular;  Laterality: N/A;  . TONSILLECTOMY    . TOTAL HIP ARTHROPLASTY Right 04/22/2016   Procedure: TOTAL HIP ARTHROPLASTY;  Surgeon: Corky Mull, MD;  Location: [facility] ORS;  Service: Orthopedics;  Laterality: Right;    Family History  Problem Relation Age of Onset  . Lung cancer Father   . Aneurysm Brother   . Stroke Brother   . Diabetes Maternal Grandmother   . Kidney disease Maternal Grandfather   . Cushing syndrome Paternal Grandmother   . Dementia Paternal Grandfather    Social History   Socioeconomic History  . Marital status: Married    Spouse name: None  . Number of children: None  . Years of education: None  . Highest education level: None  Social Needs  . Financial resource strain: Not hard at all  . Food insecurity - worry: None  . Food insecurity - inability: None  . Transportation needs - medical: No  . Transportation needs - non-medical: No  Occupational History  . None  Tobacco Use  . Smoking status: Former Smoker    Packs/day: 1.00    Years: 40.00    Pack years: 40.00    Types: Cigarettes    Last attempt to quit: 11/13/1996    Years since quitting: 21.2  . Smokeless tobacco: Never Used  Substance and Sexual Activity  . Alcohol use: Yes    Comment: occ wine  . Drug use: No  . Sexual activity: None  Other Topics Concern  . None  Social History Narrative  . None    Tobacco Counseling Counseling given: Not Answered   Clinical Intake:  Pre-visit preparation completed: Yes  Pain : 0-10 Pain Score: 5  Pain Type: Chronic pain Pain Location: Back Pain Frequency: Constant     Diabetes: No  How often do you need to have someone help you when you read instructions, pamphlets, or other written materials from your doctor or pharmacy?: 1 - Never  Interpreter Needed?: No      Activities of Daily Living In your present state of health, do you have any difficulty performing the following activities: 02/14/2018 08/23/2017  Hearing? N N  Vision? N N  Difficulty concentrating or making decisions? Y N  Comment Difficulty focusing -  Walking or climbing stairs? Y N  Dressing or bathing? N N  Doing errands, shopping? N -  Preparing Food and eating ? N -   Using the Toilet? N -  In the past six months, have you accidently leaked urine? N -  Do you have problems with loss of bowel control? N -  Managing your Medications? N -  Managing your Finances? N -  Housekeeping or managing your Housekeeping? Y -  Comment Husband assists -  Some recent data might be hidden     Immunizations and Health Maintenance Immunization History  Administered Date(s) Administered  . Influenza, High Dose Seasonal PF 01/16/2018  . Influenza-Unspecified 09/28/2013, 10/09/2014, 09/08/2016, 09/27/2016, 09/12/2017  . Pneumococcal Conjugate-13 10/09/2014  . Pneumococcal Polysaccharide-23 02/14/2018  . Tdap 07/24/2017   Health Maintenance Due  Topic Date Due  .  DEXA SCAN  05/20/2005    Patient Care Team: Burnard Hawthorne, FNP as PCP - General (Family Medicine)  Indicate any recent Medical Services you may have received from other than [facility] providers in the past year (date may be approximate).     Assessment:   This is a routine wellness examination for Melody Ochoa.  The goal of the wellness visit is to assist the patient how to close the gaps in care and create a preventative care plan for the patient.   The roster of all physicians providing medical care to patient is listed in the Snapshot section of the chart.  Taking calcium VIT D as appropriate/Osteoporosis risk reviewed.    Safety issues reviewed; Smoke and carbon monoxide detectors in the home. No firearms or firearms locked in a safe within the home. Wears seatbelts when driving or riding with others. No violence in the home.  They do not have excessive sun exposure.  Discussed the need for sun protection: hats, long sleeves and the use of sunscreen if there is significant sun exposure.  Patient is alert, normal appearance, oriented to person/place/and time.  Correctly identified the president of the [place] and recalls of 2/3 words. Performs simple calculations and can read correct time from watch  face.  Displays appropriate judgement.  No new identified risk were noted.  No failures at ADL's or IADL's.  Ambulates with cane.   BMI- discussed the importance of a healthy diet, water intake and the benefits of aerobic exercise. Educational material provided.   24 hour diet recall: Regular diet  Daily fluid intake: 2- cups of caffeine, 2 cups of water.  Encouraged increase of water intake.  Dental- every 6 months. Dr. Sharlett Iles.   Eye- Visual acuity not assessed per patient preference since they have regular follow up with the ophthalmologist.  Wears corrective lenses.  Sleep patterns- Sleeps 6-8 hours at night.  Wakes feeling rested.  PNA23 vaccine administered, tolerated well. Educational material provided.  Dexa Scan ordered; follow as directed.  Educational material provided.  Patient Concerns: None at this time. Follow up with PCP as needed.  Hearing/Vision screen Hearing Screening Comments: Patient is able to hear conversational tones without difficulty.  No issues reported.   Vision Screening Comments: Followed by [facility] Wears corrective lenses Last OV 01/2018 Visual acuity not assessed per patient preference since they have regular follow up with the ophthalmologist  Dietary issues and exercise activities discussed: Current Exercise Habits: The patient does not participate in regular exercise at present  Goals    . DIET - INCREASE WATER INTAKE     Stay hydrated    . Increase physical activity     Water aerobics      Depression Screen PHQ 2/9 Scores 02/14/2018 02/13/2018 09/27/2017  PHQ - 2 Score 0 0 0    Fall Risk Fall Risk  02/14/2018 02/13/2018 09/27/2017 09/26/2017  Falls in the past year? Yes Yes No No  Number falls in past yr: 2 or more 2 or more - -  Injury with Fall? Yes Yes - -  Comment Fell from bed x2 one fall she had to come and receive stitches into lip.Marland Kitchen  the other suffered a gash to the left leg, did no seek medical attention.   reports having fallen out of bed both times.  - -    Cognitive Function: MMSE - Mini Mental State Exam 02/14/2018  Orientation to time 5  Orientation to Place 5  Registration 3  Attention/  Calculation 5  Recall 2  Language- name 2 objects 2  Language- repeat 1  Language- follow 3 step command 3  Language- read & follow direction 1  Write a sentence 1  Copy design 1  Total score 29        Screening Tests Health Maintenance  Topic Date Due  . DEXA SCAN  05/20/2005  . TETANUS/TDAP  07/25/2027  . INFLUENZA VACCINE  Completed  . PNA vac Low Risk Adult  Completed     Plan:    End of life planning; Advance aging; Advanced directives discussed. Copy of current HCPOA/Living Will on file.    I have personally reviewed and noted the following in the patient's chart:   . Medical and social history . Use of alcohol, tobacco or illicit drugs  . Current medications and supplements . Functional ability and status . Nutritional status . Physical activity . Advanced directives . List of other physicians . Hospitalizations, surgeries, and ER visits in previous 12 months . Vitals . Screenings to include cognitive, depression, and falls . Referrals and appointments  In addition, I have reviewed and discussed with patient certain preventive protocols, quality metrics, and best practice recommendations. A written personalized care plan for preventive services as well as general preventive health recommendations were provided to patient.     Varney Biles, LPN   6/76/1951

## 2018-02-14 NOTE — Patient Instructions (Addendum)
  Melody Ochoa , Thank you for taking time to come for your Medicare Wellness Visit. I appreciate your ongoing commitment to your health goals. Please review the following plan we discussed and let me know if I can assist you in the future.   Follow up with [name], FNP as needed.    Bring a copy of your [place] and/or Living Will to be scanned into chart.  Have a great day!  These are the goals we discussed: Goals    . DIET - INCREASE WATER INTAKE     Stay hydrated    . Increase physical activity     Water aerobics       This is a list of the screening recommended for you and due dates:  Health Maintenance  Topic Date Due  . DEXA scan (bone density measurement)  05/20/2005  . Tetanus Vaccine  07/25/2027  . Flu Shot  Completed  . Pneumonia vaccines  Completed

## 2018-02-15 ENCOUNTER — Other Ambulatory Visit: Payer: Self-pay | Admitting: Student in an Organized Health Care Education/Training Program

## 2018-02-15 ENCOUNTER — Ambulatory Visit
Admission: RE | Admit: 2018-02-15 | Discharge: 2018-02-15 | Disposition: A | Discharge status: Home / Self Care | Payer: Medicare Other | Source: Ambulatory Visit | Attending: Neurosurgery | Admitting: Neurosurgery

## 2018-02-15 DIAGNOSIS — M4326 Fusion of spine, lumbar region: Secondary | ICD-10-CM | POA: Diagnosis not present

## 2018-02-15 DIAGNOSIS — Z981 Arthrodesis status: Secondary | ICD-10-CM | POA: Insufficient documentation

## 2018-02-15 DIAGNOSIS — G894 Chronic pain syndrome: Secondary | ICD-10-CM | POA: Diagnosis not present

## 2018-02-15 DIAGNOSIS — M48061 Spinal stenosis, lumbar region without neurogenic claudication: Secondary | ICD-10-CM | POA: Diagnosis not present

## 2018-02-19 LAB — COMPLIANCE DRUG ANALYSIS, UR

## 2018-02-20 LAB — CUP PACEART REMOTE DEVICE CHECK
Date Time Interrogation Session: 20190215143658
Implantable Pulse Generator Implant Date: 20180918

## 2018-02-21 ENCOUNTER — Telehealth: Payer: Self-pay

## 2018-02-21 NOTE — Telephone Encounter (Addendum)
lmtc ? Is patient taking any medication over the counter ? Symptoms.  Margaret's schedule is booked? Can route message to [name] and may suggest over the counter  Medications.

## 2018-02-21 NOTE — Telephone Encounter (Signed)
error 

## 2018-02-21 NOTE — Telephone Encounter (Signed)
Copied from [place]. Topic: Appointment Scheduling - Scheduling Inquiry for Clinic >> Feb 21, 2018 12:00 PM Ether Griffins B wrote: Reason for CRM: pt had appt scheduled on 02/27/18 for cough but she cant do that day and is hoping to be worked in. I offered her this Thursday with Almyra Free but she already has appts that day as well.

## 2018-02-22 ENCOUNTER — Ambulatory Visit (INDEPENDENT_AMBULATORY_CARE_PROVIDER_SITE_OTHER): Payer: Medicare Other | Admitting: *Deleted

## 2018-02-22 DIAGNOSIS — I63219 Cerebral infarction due to unspecified occlusion or stenosis of unspecified vertebral arteries: Secondary | ICD-10-CM | POA: Diagnosis not present

## 2018-02-23 ENCOUNTER — Ambulatory Visit: Payer: Medicare Other | Admitting: Family Medicine

## 2018-02-23 ENCOUNTER — Encounter: Payer: Self-pay | Admitting: Student in an Organized Health Care Education/Training Program

## 2018-02-23 ENCOUNTER — Ambulatory Visit
Admission: RE | Admit: 2018-02-23 | Discharge: 2018-02-23 | Disposition: A | Discharge status: Home / Self Care | Payer: Medicare Other | Source: Ambulatory Visit | Attending: Family | Admitting: Family

## 2018-02-23 ENCOUNTER — Ambulatory Visit
Payer: Medicare Other | Attending: Student in an Organized Health Care Education/Training Program | Admitting: Student in an Organized Health Care Education/Training Program

## 2018-02-23 ENCOUNTER — Other Ambulatory Visit: Payer: Self-pay

## 2018-02-23 VITALS — BP 127/68 | HR 88 | Temp 98.0°F | Resp 18 | Ht 64.5 in | Wt 182.0 lb

## 2018-02-23 DIAGNOSIS — Z9889 Other specified postprocedural states: Secondary | ICD-10-CM | POA: Diagnosis not present

## 2018-02-23 DIAGNOSIS — M961 Postlaminectomy syndrome, not elsewhere classified: Secondary | ICD-10-CM

## 2018-02-23 DIAGNOSIS — K219 Gastro-esophageal reflux disease without esophagitis: Secondary | ICD-10-CM | POA: Diagnosis not present

## 2018-02-23 DIAGNOSIS — Z1231 Encounter for screening mammogram for malignant neoplasm of breast: Secondary | ICD-10-CM | POA: Insufficient documentation

## 2018-02-23 DIAGNOSIS — Z8673 Personal history of transient ischemic attack (TIA), and cerebral infarction without residual deficits: Secondary | ICD-10-CM | POA: Diagnosis not present

## 2018-02-23 DIAGNOSIS — M5416 Radiculopathy, lumbar region: Secondary | ICD-10-CM | POA: Diagnosis not present

## 2018-02-23 DIAGNOSIS — F329 Major depressive disorder, single episode, unspecified: Secondary | ICD-10-CM | POA: Insufficient documentation

## 2018-02-23 DIAGNOSIS — M25552 Pain in left hip: Secondary | ICD-10-CM | POA: Insufficient documentation

## 2018-02-23 DIAGNOSIS — M1612 Unilateral primary osteoarthritis, left hip: Secondary | ICD-10-CM | POA: Insufficient documentation

## 2018-02-23 DIAGNOSIS — M4800 Spinal stenosis, site unspecified: Secondary | ICD-10-CM | POA: Diagnosis not present

## 2018-02-23 DIAGNOSIS — M545 Low back pain: Secondary | ICD-10-CM | POA: Insufficient documentation

## 2018-02-23 DIAGNOSIS — Z79899 Other long term (current) drug therapy: Secondary | ICD-10-CM | POA: Insufficient documentation

## 2018-02-23 DIAGNOSIS — Z87891 Personal history of nicotine dependence: Secondary | ICD-10-CM | POA: Diagnosis not present

## 2018-02-23 DIAGNOSIS — G894 Chronic pain syndrome: Secondary | ICD-10-CM | POA: Diagnosis not present

## 2018-02-23 DIAGNOSIS — M5137 Other intervertebral disc degeneration, lumbosacral region: Secondary | ICD-10-CM | POA: Diagnosis not present

## 2018-02-23 DIAGNOSIS — Z981 Arthrodesis status: Secondary | ICD-10-CM | POA: Diagnosis not present

## 2018-02-23 MED ORDER — HYDROCODONE-ACETAMINOPHEN 5-325 MG PO TABS: 1.0000 | ORAL_TABLET | Freq: Two times a day (BID) | ORAL | 0 refills | Status: DC | PRN

## 2018-02-23 NOTE — Progress Notes (Signed)
Patient's Name: Melody Ochoa  MRN: [id number]  Referring Provider: Burnard Hawthorne, FNP  DOB: 1941-01-21  PCP: Burnard Hawthorne, FNP  DOS: 02/23/2018  Note by: Gillis Santa, MD  Service setting: Ambulatory outpatient  Specialty: Interventional Pain Management  Location: [facility] (AMB) Pain Management Facility    Patient type: Established   Primary Reason(s) for Visit: Encounter for evaluation before starting new chronic pain management plan of care (Level of risk: moderate) CC: Back Pain (low) and Hip Pain (left)  HPI  Melody Ochoa is a 77 y.o. year old, female patient, who comes today for a follow-up evaluation to review the test results and decide on a treatment plan. She has Spondylolisthesis of lumbar region; Back pain; UTI (lower urinary tract infection); Low back pain; Personal history of colonic polyps; Status post total replacement of right hip; Stroke (cerebrum) (HCC) - Patchy small volume acute ischemic nonhemorrhagic superior left cerebellar infarct, left SCA territory, of undetermined etiology, with diffuse atherosclerosis; Depression, recurrent ([place]); Pulmonary nodules; History of lumbar surgery; Atheromatous plaque; Hyperlipidemia; Rash; Lumbar radiculopathy; History of stroke; Greater trochanteric bursitis of both hips; Degeneration of lumbar or lumbosacral intervertebral disc; Major depression in remission ([place]); and Primary osteoarthritis of left hip on their problem list. Her primarily concern today is the Back Pain (low) and Hip Pain (left)  Pain Assessment: Location: Lower Back Radiating: right hip Onset: More than a month ago Duration: Chronic pain Quality: Burning Severity: 7 /10 (self-reported pain score)  Note: Reported level is inconsistent with clinical observations.                         When using our objective Pain Scale, levels between 6 and 10/10 are said to belong in an emergency room, as it progressively worsens from a 6/10, described as severely limiting,  requiring emergency care not usually available at an outpatient pain management facility. At a 6/10 level, communication becomes difficult and requires great effort. Assistance to reach the emergency department may be required. Facial flushing and profuse sweating along with potentially dangerous increases in heart rate and blood pressure will be evident. Effect on ADL:   Timing: Intermittent Modifying factors: rest  Melody Ochoa comes in today for a follow-up visit after her initial evaluation on 02/13/2018. Today we went over the results of her tests. These were explained in "Layman's terms". During today's appointment we went over my diagnostic impression, as well as the proposed treatment plan.   77 year old female with a history of posterior L3-L5 spinal fusion and decompression on 12/03/2016 who presents with axial low back pain that is worse on the left that radiates into her left groin and buttock.  She denies any pain in her lower extremities.  She has minimal right-sided pain.  She is status post right hip replacement surgery.  Of note patient does have a history of a right TIA for which she is on Plavix 75 mg.  She states that she has some minor speech deficit and slight left facial droop as a result of the TIA.  Patient finds it difficult to ambulate for an extended period of time.  She states that she has noted worsening weakness since her last spinal fusion and decompression in December 2017.  Patient has a history of 3 lumbar spine surgeries.  Patient also has a history of cervical spine surgery.Current medications include Wellbutrin 300 mg for depression, Cymbalta 60 mg daily, gabapentin 300 mg nightly.  Higher dose of gabapentin resulted  in sedation, abnormal dreams.  UDS appropriate, patient has completed CT lumbar spine with results below and has also followed up with Dr Cari Caraway who recommended spinal injections.   In considering the treatment plan options, Melody Ochoa was reminded  that I no longer take patients for medication management only. I asked her to let me know if she had no intention of taking advantage of the interventional therapies, so that we could make arrangements to provide this space to someone interested. I also made it clear that undergoing interventional therapies for the purpose of getting pain medications is very inappropriate on the part of a patient, and it will not be tolerated in this practice. This type of behavior would suggest true addiction and therefore it requires referral to an addiction specialist.   Further details on both, my assessment(s), as well as the proposed treatment plan, please see below.  Controlled Substance Pharmacotherapy Assessment REMS (Risk Evaluation and Mitigation Strategy)  Analgesic: Hydrocodone 5 mg BID, #60/month MME/day: 10 mg/day. Pill Count: None expected due to no prior prescriptions written by our practice. Hart Rochester, RN  02/23/2018 12:35 PM  Sign at close encounter Safety precautions to be maintained throughout the outpatient stay will include: orient to surroundings, keep bed in low position, maintain call bell within reach at all times, provide assistance with transfer out of bed and ambulation.    Pharmacokinetics: Liberation and absorption (onset of action): WNL Distribution (time to peak effect): WNL Metabolism and excretion (duration of action): WNL         Pharmacodynamics: Desired effects: Analgesia: Ms. Caver reports >50% benefit. Functional ability: Patient reports that medication allows her to accomplish basic ADLs Clinically meaningful improvement in function (CMIF): Sustained CMIF goals met Perceived effectiveness: Described as relatively effective, allowing for increase in activities of daily living (ADL) Undesirable effects: Side-effects or Adverse reactions: None reported Monitoring:  PMP: Online review of the past 45-monthperiod previously conducted. Not applicable at this  point since we have not taken over the patient's medication management yet. List of other Serum/Urine Drug Screening Test(s):  Lab Results  Component Value Date   COCAINSCRNUR NONE DETECTED 08/09/2017   THCU NONE DETECTED 08/09/2017   ETH <5 08/09/2017   List of all UDS test(s) done:  Lab Results  Component Value Date   SUMMARY FINAL 02/15/2018   Last UDS on record: Summary  Date Value Ref Range Status  02/15/2018 FINAL  Final    Comment:    ==================================================================== TOXASSURE COMP DRUG ANALYSIS,UR ==================================================================== Test                             Result       Flag       Units Drug Present and Declared for Prescription Verification   Gabapentin                     PRESENT      EXPECTED   Bupropion                      PRESENT      EXPECTED   Hydroxybupropion               PRESENT      EXPECTED    Hydroxybupropion is an expected metabolite of bupropion.   Duloxetine  PRESENT      EXPECTED ==================================================================== Test                      Result    Flag   Units      Ref Range   Creatinine              90               mg/dL      >=20 ==================================================================== Declared Medications:  The flagging and interpretation on this report are based on the  following declared medications.  Unexpected results may arise from  inaccuracies in the declared medications.  **Note: The testing scope of this panel includes these medications:  Bupropion (Wellbutrin)  Duloxetine (Cymbalta)  Gabapentin  **Note: The testing scope of this panel does not include following  reported medications:  Atorvastatin (Lipitor)  Calcium carbonate (Calcium carbonate/Vitamin D)  Clopidogrel (Plavix)  Cyanocobalamin  Iron (Ferrous Sulfate)  Mupirocin (Bactroban)  Triamcinolone (Kenalog)  Vitamin C  Vitamin D  (Calcium carbonate/Vitamin D) ==================================================================== For clinical consultation, please call [phone]. ====================================================================    UDS interpretation: No unexpected findings.          Medication Assessment Form: Patient introduced to form today Treatment compliance: Treatment may start today if patient agrees with proposed plan. Evaluation of compliance is not applicable at this point Risk Assessment Profile: Aberrant behavior: See initial evaluations. None observed or detected today Comorbid factors increasing risk of overdose: See initial evaluation. No additional risks detected today Medical Psychology Evaluation: Please see scanned results in medical record. Opioid Risk Tool - 02/23/18 1236      Family History of Substance Abuse   Alcohol  Positive Female    Illegal Drugs  Negative    Rx Drugs  Negative      Personal History of Substance Abuse   Alcohol  Negative    Illegal Drugs  Negative    Rx Drugs  Negative      Age   Age between 50-45 years   No      History of Preadolescent Sexual Abuse   History of Preadolescent Sexual Abuse  Negative or Female      Psychological Disease   Psychological Disease  Negative    Depression  Positive      Total Score   Opioid Risk Tool Scoring  2    Opioid Risk Interpretation  Low Risk      ORT Scoring interpretation table:  Score <3 = Low Risk for SUD  Score between 4-7 = Moderate Risk for SUD  Score >8 = High Risk for Opioid Abuse   Risk Mitigation Strategies:  Patient opioid safety counseling: Completed today. Counseling provided to patient as per "Patient Counseling Document". Document signed by patient, attesting to counseling and understanding Patient-Prescriber Agreement (PPA): Obtained today.  Controlled substance notification to other providers: Written and sent today.  Pharmacologic Plan: Today we may be taking over the  patient's pharmacological regimen. See below.             Laboratory Chemistry  Inflammation Markers (CRP: Acute Phase) (ESR: Chronic Phase) No results found for: CRP, ESRSEDRATE, LATICACIDVEN                       Rheumatology Markers No results found for: RF, ANA, LABURIC, URICUR, LYMEIGGIGMAB, LYMEABIGMQN              Renal Function Markers Lab Results  Component Value  Date   BUN 10 08/19/2017   CREATININE 1.01 (H) 08/19/2017   GFRAA >60 08/19/2017   GFRNONAA 52 (L) 08/19/2017                 Hepatic Function Markers Lab Results  Component Value Date   AST 21 08/09/2017   ALT 17 08/09/2017   ALBUMIN 4.3 08/09/2017   ALKPHOS 59 08/09/2017                 Electrolytes Lab Results  Component Value Date   NA 140 08/19/2017   K 3.7 08/19/2017   CL 104 08/19/2017   CALCIUM 9.3 08/19/2017                        Neuropathy Markers Lab Results  Component Value Date   VITAMINB12 334 11/23/2014   FOLATE >20.0 11/23/2014   HGBA1C 5.3 08/10/2017                 Bone Pathology Markers No results found for: [place], [id number], [id number], [id number], 25OHVITD1, 25OHVITD2, 25OHVITD3, TESTOFREE, TESTOSTERONE                       Coagulation Parameters Lab Results  Component Value Date   INR 0.91 08/09/2017   LABPROT 12.2 08/09/2017   APTT 27 08/09/2017   PLT 318 08/19/2017                 Cardiovascular Markers Lab Results  Component Value Date   TROPONINI <0.03 08/19/2017   HGB 11.2 (L) 08/19/2017   HCT 32.9 (L) 08/19/2017                 CA Markers No results found for: CEA, CA125, LABCA2               Note: Lab results reviewed.  Recent Diagnostic Imaging Review    Lumbosacral Imaging: Lumbar MR wo contrast:  Results for orders placed during the hospital encounter of 11/21/14  MR Lumbar Spine Wo Contrast   Narrative CLINICAL DATA:  Severe low back pain. Fever and incontinence. Surgery lumbar surgery 11/18/2014.  EXAM: MRI LUMBAR SPINE WITHOUT  CONTRAST  TECHNIQUE: Multiplanar, multisequence MR imaging of the lumbar spine was performed. No intravenous contrast was administered.  COMPARISON:  09/16/2014  FINDINGS: The examination had to be discontinued prior to completion due to patient pain and inability to tolerate further imaging. Sagittal T1, T2, and STIR and axial T2 weighted images were obtained.  Vertebral alignment is unchanged without significant listhesis. Vertebral body heights are preserved. Sequelae of interval L4-5 PLIF are identified. There is diffuse lumbar disc desiccation. Multiple small Schmorl's nodes are again seen. Conus medullaris is normal in signal and terminates at L1. Small T2 hyperintense renal lesions are similar to the prior study and may represent cysts. 2.2 cm left adrenal nodule is unchanged.  T10-11 and T11-12: Only imaged sagittally. Mild disc bulging without evidence of significant spinal canal stenosis. Mild right neural foraminal narrowing at T10-11.  T12-L1:  Mild disc bulging without stenosis, unchanged.  L1-2:  Mild disc bulging without stenosis, unchanged.  L2-3: Mild disc bulging and mild facet hypertrophy without stenosis, unchanged.  L3-4: Interval bilateral laminotomies. Unchanged circumferential disc bulging and facet hypertrophy contributing to spinal canal and lateral recess narrowing as well as mild-to-moderate left neural foraminal stenosis. Superior extension of postoperative fluid collection at L4-5 contributes to increased spinal stenosis at and just below the disc space  level, further described below.  L4-5: Interval wide posterior decompression and posterior and interbody fusion. Fluid collection at the laminectomy site measures approximately 2.4 x 2.5 x 4.6 cm (transverse x AP x craniocaudal). There is a 3.0 x 1.3 x 1.5 cm oblong markedly T2 hypointense focus at the anterior aspect of the fluid collection with complete thecal sac effacement at this level.  Neural foramina are not well evaluated due to artifact and postsurgical changes. Postoperative fluid collection in the superficial soft tissues at the incision measures 3.8 x 1.5 x 8.6 cm. There is edema diffusely within the posterior paraspinal soft tissues.  L5-S1: Severe right and moderate left facet arthrosis with slightly increased size of small right facet joint effusion. No definite progressive marrow edema involving the facets. Disc bulging and facet disease results in moderate right and mild left neural foraminal stenosis without spinal stenosis, unchanged.  IMPRESSION: 1. Interval posterior decompression and fusion at L4-5. 4.6 cm postoperative fluid collection at the laminectomy site with additional 3.8 cm low signal focus along its anterior margin. This is nonspecific, with considerations including retracted hematoma, gas, and possibly residual osseous fragments. Further evaluation with CT is suggested. There is resultant complete thecal sac effacement at L4-5. Fluid collection results in increased spinal canal narrowing at L3-4 as well. Superimposed infection is not excluded. 2. Severe facet arthrosis at L5-S1 with slightly increased facet joint effusion on the right.   Electronically Signed   By: Logan Bores   On: 11/22/2014 07:35     Lumbar MR w/wo contrast:  Results for orders placed during the hospital encounter of 09/06/16  MR Lumbar Spine W Wo Contrast   Narrative CLINICAL DATA:  Prior surgery 1 year ago, low back and LEFT leg pain.  EXAM: MRI LUMBAR SPINE WITHOUT AND WITH CONTRAST  TECHNIQUE: Multiplanar and multiecho pulse sequences of the lumbar spine were obtained without and with intravenous contrast.  CONTRAST:  MultiHance 18 mL.  COMPARISON:  02/26/2015  FINDINGS: Segmentation:  Standard.  Alignment:  Anatomic  Vertebrae: Prior PLIF L4-5. Solid arthrodesis not established. No worrisome osseous lesion. Endplate reactive changes L2-L3.  No concerning postcontrast enhancement.  Conus medullaris: Extends to the L1 level and appears normal.  Paraspinal and other soft tissues: Unremarkable  Disc levels:  L1-L2:  Annular bulge.  Mild facet hypertrophy.  No impingement.  L2-L3: Annular bulge centrally. Foraminal and extraforaminal protrusion on the LEFT. Facet arthropathy. LEFT L2 and LEFT L3 nerve root impingement likely.  L3-L4: Disc space narrowing. Central extrusion. Advanced posterior element hypertrophy affecting facets and ligamentum flavum. Moderate to severe stenosis. Disc material extends to both neural foramina. BILATERAL L4 and L3 nerve root impingement, worse on the LEFT.  L4-L5: Adequate posterior decompression. No definite residual impingement. Solid arthrodesis not established.  L5-S1: Annular bulge. Severe facet arthropathy, worse on the RIGHT. Disc material extends into the RIGHT neural foramen where nodular hypertrophy of the RIGHT ligamentum flavum measuring 5 mm could represent synovial cyst. RIGHT L5 and RIGHT S1 nerve root impingement are possible.  Compared with priors, the previously noted dorsal fluid collection has resolved. Adjacent segment disease at L3-4 and L2-3 is worse.  IMPRESSION: Previous L4-5 PLIF. Solid arthrodesis not established. Consider CT lumbar spine without contrast to further assess.  Resolved postoperative fluid collection from 2016. No worrisome postcontrast enhancement to suggest infection.  Adjacent segment disease at L3-4 has progressed from 2016. Posterior element hypertrophy and central extrusion contribute to BILATERAL L3 and L4 nerve root impingement, worse on the  LEFT.  New foraminal and extraforaminal protrusion on the LEFT at L2-L3 could contribute also to LEFT leg pain.   Electronically Signed   By: Staci Righter M.D.   On: 09/06/2016 11:33    Lumbar CT wo contrast:  Results for orders placed during the hospital encounter of 02/15/18  CT  LUMBAR SPINE WO CONTRAST   Narrative CLINICAL DATA:  Lumbar fusion.  Left hip pain.  EXAM: CT LUMBAR SPINE WITHOUT CONTRAST  TECHNIQUE: Multidetector CT imaging of the lumbar spine was performed without intravenous contrast administration. Multiplanar CT image reconstructions were also generated.  COMPARISON:  MRI of the lumbar spine 09/06/2016.  FINDINGS: Segmentation: 5 non rib-bearing lumbar type vertebral bodies are present.  Alignment: Slight retrolisthesis is present at L2-3. AP alignment is otherwise anatomic.  Vertebrae: Endplate changes are associated with fusion at L3-4 and L4-5. Vertebral body heights are maintained. Schmorl's nodes are present at L2-3.  Paraspinal and other soft tissues: Atherosclerotic calcifications are present in the aorta and branch vessels. There is no aneurysm. The lung bases are clear.  Disc levels: L1-2: Moderate facet hypertrophy is present. Mild lateral disc bulging is present. Mild left foraminal narrowing is noted.  L2-3: A far left lateral disc protrusion is present. Moderate facet hypertrophy is noted bilaterally. Mild subarticular narrowing is present on the left. Moderate left and mild right foraminal stenosis has progressed.  L3-4: Lumbar fusion has been extended. A right laminectomy is noted. There is bridging bone across the disc space. Central canal is decompressed. A right foraminotomy is noted. The foramina are patent bilaterally.  L4-5: Solid fusion is present. A wide laminectomy is noted. No residual recurrent stenosis is present.  L5-S1: Laminectomy is present. Mild disc bulging is present without significant stenosis.  IMPRESSION: 1. Extension of lumbar fusion to include L3-4 with decompression of the central canal and foramina bilaterally. 2. Progressive adjacent level disease at L2-3 with mild left subarticular narrowing and moderate left and mild right foraminal stenosis. 3. Stable postoperative changes at  L4-5 and L5-S1 without significant stenosis at these levels.   Electronically Signed   By: San Morelle M.D.   On: 02/15/2018 09:30     Lumbar DG 1V:  Results for orders placed during the hospital encounter of 11/18/14  DG Lumbar Spine 1 View   Narrative CLINICAL DATA:  Lumbar spine surgery.  Spondylolisthesis.  EXAM: LUMBAR SPINE - 1 VIEW  COMPARISON:  MRI 09/16/2014  FINDINGS: Single intraoperative image demonstrates surgical marking along the posterior aspect of L5-S1.  IMPRESSION: Surgical marking at L5-S1.   Electronically Signed   By: Markus Daft M.D.   On: 11/18/2014 14:59     Lumbar DG 2-3 views:  Results for orders placed during the hospital encounter of 11/18/14  DG Lumbar Spine 2-3 Views   Narrative CLINICAL DATA:  L4-5 PLIF  EXAM: LUMBAR SPINE - 2-3 VIEW; DG C-ARM 61-120 MIN  COMPARISON:  None.  FINDINGS: Two intraoperative fluoroscopic spot images of the lumbar spine are provided. Interval PLIF at L4-5.  IMPRESSION: PLIF at L4-5.   Electronically Signed   By: Kathreen Devoid   On: 11/18/2014 16:36    Lumbar DG (Complete) 4+V:  Results for orders placed during the hospital encounter of 11/21/14  DG Lumbar Spine Complete   Narrative CLINICAL DATA:  Recent lumbar fusion with constipation. Lower back pain.  EXAM: LUMBAR SPINE - COMPLETE 4+ VIEW  COMPARISON:  11/18/2014 fluoroscopy  FINDINGS: Recent L4-5 posterior lumbar interbody fusion with rod and  pedicle screw fixation. Vertebral body heights are maintained - no evidence of acute fracture. The intervertebral graft remains in good position. The rod and pedicle screws are located.  Degenerative endplate spurring throughout the lumbar spine. No endplate erosion.  Osteopenia.  IMPRESSION: L4-5 PLIF with rod and pedicle screw fixation.  No adverse findings.   Electronically Signed   By: Jorje Guild M.D.   On: 11/22/2014 00:24     Results for orders placed during  the hospital encounter of 04/07/16  MR Hip Right Wo Contrast   Narrative CLINICAL DATA:  Bilateral hip pain for 1 year, left worse than right. No known injury.  EXAM: MR OF THE RIGHT HIP WITHOUT CONTRAST  TECHNIQUE: Multiplanar, multisequence MR imaging was performed. No intravenous contrast was administered.  COMPARISON:  Initial encounter.  FINDINGS: Bones: No fracture, avascular necrosis or worrisome marrow lesion is identified. Osteophytosis about the femoral heads is much worse on the right. Small subchondral cysts are seen in the left acetabular roof. Artifact from lower lumbar fusion hardware is noted  Articular cartilage and labrum  Articular cartilage: Severely degenerated with associated joint space narrowing on the right. Milder degree of degenerative change on the left is identified.  Labrum: The right labrum is diffusely degenerated and torn. A right paralabral cyst at the 12 o'clock position measures 1.3 cm in diameter.  Joint or bursal effusion  Joint effusion:  None.  Bursae: Fluid is present in the trochanter bursae, greater on the left.  Muscles and tendons  Muscles and tendons:  Intact.  Other findings  Miscellaneous: Imaged intrapelvic contents demonstrate extensive sigmoid diverticulosis.  IMPRESSION: Right worse than left hip osteoarthritis. Associated labral tearing on the right with paralabral cyst formation is noted.  Bilateral trochanteric bursitis appears worse on the left.  Sigmoid diverticulosis.   Electronically Signed   By: Inge Rise M.D.   On: 04/07/2016 14:54      Complexity Note: Imaging results reviewed. Results shared with Melody Ochoa, using Layman's terms.                         Meds   Current Outpatient Medications:  .  atorvastatin (LIPITOR) 80 MG tablet, Take 1 tablet (80 mg total) daily at 6 PM by mouth., Disp: 90 tablet, Rfl: 3 .  buPROPion (WELLBUTRIN XL) 300 MG 24 hr tablet, Take 300 mg by mouth  daily. , Disp: , Rfl:  .  Calcium Carbonate-Vitamin D (CALTRATE 600+D PO), Take 1 tablet by mouth daily., Disp: , Rfl:  .  clopidogrel (PLAVIX) 75 MG tablet, Take 1 tablet (75 mg total) by mouth daily., Disp: 30 tablet, Rfl: 0 .  Cyanocobalamin (VITAMIN B-12 PO), Take 1 tablet by mouth daily., Disp: , Rfl:  .  DULoxetine (CYMBALTA) 60 MG capsule, Take 60 mg by mouth daily. , Disp: , Rfl:  .  ferrous sulfate 325 (65 FE) MG tablet, Take 1 tablet (325 mg total) by mouth daily., Disp: 30 tablet, Rfl: 0 .  gabapentin (NEURONTIN) 300 MG capsule, Take 300 mg by mouth at bedtime. , Disp: , Rfl: 3 .  mupirocin ointment (BACTROBAN) 2 %, Apply 1 application topically 2 (two) times daily., Disp: 22 g, Rfl: 1 .  triamcinolone cream (KENALOG) 0.1 %, Apply 1 application topically as needed., Disp: , Rfl: 0 .  vitamin C (ASCORBIC ACID) 500 MG tablet, Take 500 mg by mouth daily., Disp: , Rfl:  .  HYDROcodone-acetaminophen (NORCO/VICODIN) 5-325 MG tablet, Take 1  tablet by mouth 2 (two) times daily as needed for moderate pain. For chronic pain To last for 30 days from fill date, Disp: 60 tablet, Rfl: 0  ROS  Constitutional: Denies any fever or chills Gastrointestinal: No reported hemesis, hematochezia, vomiting, or acute GI distress Musculoskeletal: Denies any acute onset joint swelling, redness, loss of ROM, or weakness Neurological: No reported episodes of acute onset apraxia, aphasia, dysarthria, agnosia, amnesia, paralysis, loss of coordination, or loss of consciousness  Allergies  Ms. Tamburri is allergic to [employer] sodium]; aspirin; celebrex [celecoxib]; effexor [venlafaxine]; hydrocodone-homatropine; ibuprofen; tape; vioxx [rofecoxib]; other; and tramadol.  [place]  Drug: Ms. Kope  reports that she does not use drugs. Alcohol:  reports that she drinks alcohol. Tobacco:  reports that she quit smoking about 21 years ago. Her smoking use included cigarettes. She has a 40.00 pack-year smoking  history. She has never used smokeless tobacco. Medical:  has a past medical history of Arthritis, Atheromatous plaque (10/18/2017), Back pain, Cancer ([place]), Depression, Diverticulosis, Dizziness, GERD (gastroesophageal reflux disease), Heart murmur, Hyperlipidemia (10/18/2017), PONV (postoperative nausea and vomiting), and Stroke ([place]). Surgical: Melody Ochoa  has a past surgical history that includes Tonsillectomy; Facelift (89); Cervical disc surgery (8); Back surgery (12); Colonoscopy with propofol (N/A, 01/13/2016); Total hip arthroplasty (Right, 04/22/2016); TEE without cardioversion (N/A, 08/12/2017); and LOOP RECORDER INSERTION (N/A, 08/23/2017). Family: family history includes Aneurysm in her brother; Cushing syndrome in her paternal grandmother; Dementia in her paternal grandfather; Diabetes in her maternal grandmother; Kidney disease in her maternal grandfather; Lung cancer in her father; Stroke in her brother.  Constitutional Exam  General appearance: Well nourished, well developed, and well hydrated. In no apparent acute distress Vitals:   02/23/18 1231  BP: 127/68  Pulse: 88  Resp: 18  Temp: 98 F (36.7 C)  TempSrc: Oral  SpO2: 100%  Weight: 182 lb (82.6 kg)  Height: 5' 4.5" (1.638 m)   BMI Assessment: Estimated body mass index is 30.76 kg/m as calculated from the following:   Height as of this encounter: 5' 4.5" (1.638 m).   Weight as of this encounter: 182 lb (82.6 kg).  BMI interpretation table: BMI level Category Range association with higher incidence of chronic pain  <18 kg/m2 Underweight   18.5-24.9 kg/m2 Ideal body weight   25-29.9 kg/m2 Overweight Increased incidence by 20%  30-34.9 kg/m2 Obese (Class I) Increased incidence by 68%  35-39.9 kg/m2 Severe obesity (Class II) Increased incidence by 136%  >40 kg/m2 Extreme obesity (Class III) Increased incidence by 254%   BMI Readings from Last 4 Encounters:  02/23/18 30.76 kg/m  02/14/18 33.99 kg/m  02/13/18 30.76  kg/m  01/16/18 33.81 kg/m   Wt Readings from Last 4 Encounters:  02/23/18 182 lb (82.6 kg)  02/14/18 198 lb (89.8 kg)  02/13/18 182 lb (82.6 kg)  01/16/18 197 lb (89.4 kg)  Psych/Mental status: Alert, oriented x 3 (person, place, & time)       Eyes: PERLA Respiratory: No evidence of acute respiratory distress  Cervical Spine Area Exam  Skin & Axial Inspection: No masses, redness, edema, swelling, or associated skin lesions Alignment: Symmetrical Functional ROM: Unrestricted ROM      Stability: No instability detected Muscle Tone/Strength: Functionally intact. No obvious neuro-muscular anomalies detected. Sensory (Neurological): Unimpaired Palpation: No palpable anomalies              Upper Extremity (UE) Exam    Side: Right upper extremity  Side: Left upper extremity  Skin & Extremity Inspection: Skin  color, temperature, and hair growth are WNL. No peripheral edema or cyanosis. No masses, redness, swelling, asymmetry, or associated skin lesions. No contractures.  Skin & Extremity Inspection: Skin color, temperature, and hair growth are WNL. No peripheral edema or cyanosis. No masses, redness, swelling, asymmetry, or associated skin lesions. No contractures.  Functional ROM: Unrestricted ROM          Functional ROM: Unrestricted ROM          Muscle Tone/Strength: Functionally intact. No obvious neuro-muscular anomalies detected.  Muscle Tone/Strength: Functionally intact. No obvious neuro-muscular anomalies detected.  Sensory (Neurological): Unimpaired          Sensory (Neurological): Unimpaired          Palpation: No palpable anomalies              Palpation: No palpable anomalies              Specialized Test(s): Deferred         Specialized Test(s): Deferred          Thoracic Spine Area Exam  Skin & Axial Inspection: No masses, redness, or swelling Alignment: Symmetrical Functional ROM: Unrestricted ROM Stability: No instability detected Muscle Tone/Strength: Functionally  intact. No obvious neuro-muscular anomalies detected. Sensory (Neurological): Unimpaired Muscle strength & Tone: No palpable anomalies   Lumbar Spine Area Exam  Skin & Axial Inspection: Well healed scar from previous spine surgery detected Alignment: Symmetrical Functional ROM: Decreased ROM, to the left Stability: No instability detected Muscle Tone/Strength: Functionally intact. No obvious neuro-muscular anomalies detected. Sensory (Neurological): Articular pain pattern Palpation: Complains of area being tender to palpation Bilateral Fist Percussion Test Provocative Tests: Lumbar Hyperextension and rotation test: Positive bilaterally for facet joint pain. Lumbar Lateral bending test: Positive ipsilateral radicular pain, on the left. Positive for left-sided foraminal stenosis. Patrick's Maneuver: Positive for left-sided S-I arthralgia              Gait & Posture Assessment  Ambulation: Patient ambulates using a cane Gait: Antalgic Posture: Difficulty standing up straight, due to pain   Lower Extremity Exam    Side: Right lower extremity  Side: Left lower extremity  Skin & Extremity Inspection: Skin color, temperature, and hair growth are WNL. No peripheral edema or cyanosis. No masses, redness, swelling, asymmetry, or associated skin lesions. No contractures.  Skin & Extremity Inspection: Skin color, temperature, and hair growth are WNL. No peripheral edema or cyanosis. No masses, redness, swelling, asymmetry, or associated skin lesions. No contractures.  Functional ROM: Unrestricted ROM          Functional ROM: Unrestricted ROM          Muscle Tone/Strength: Functionally intact. No obvious neuro-muscular anomalies detected.  Muscle Tone/Strength: S1 weakness  Sensory (Neurological): Unimpaired  Sensory (Neurological): Arthropathic arthralgia  Palpation: No palpable anomalies  Palpation: No palpable anomalies    Assessment & Plan  Primary Diagnosis & Pertinent Problem  List: The primary encounter diagnosis was Lumbar radiculopathy. Diagnoses of Degeneration of lumbar or lumbosacral intervertebral disc, History of lumbar surgery, Failed back surgical syndrome, and Chronic pain syndrome were also pertinent to this visit.  Visit Diagnosis: 1. Lumbar radiculopathy   2. Degeneration of lumbar or lumbosacral intervertebral disc   3. History of lumbar surgery   4. Failed back surgical syndrome   5. Chronic pain syndrome    Problems updated and reviewed during this visit: Problem  History of Lumbar Surgery  Lumbar Radiculopathy  Degeneration of Lumbar Or Lumbosacral Intervertebral Disc  General  Recommendations: The pain condition that the patient suffers from is best treated with a multidisciplinary approach that involves an increase in physical activity to prevent de-conditioning and worsening of the pain cycle, as well as psychological counseling (formal and/or informal) to address the co-morbid psychological affects of pain. Treatment will often involve judicious use of pain medications and interventional procedures to decrease the pain, allowing the patient to participate in the physical activity that will ultimately produce long-lasting pain reductions. The goal of the multidisciplinary approach is to return the patient to a higher level of overall function and to restore their ability to perform activities of daily living.   77 year old female with a history of posterior L3-L5 spinal fusion and decompression on 12/03/2016 who presents with axial low back pain that is worse on the left that radiates into her left groin and buttock.  She denies any pain in her lower extremities.  She has minimal right-sided pain.  She is status post right hip replacement surgery.  Of note patient does have a history of a right TIA for which she is on Plavix 75 mg.  She states that she has some minor speech deficit and slight left facial droop as a result of the TIA.  Patient finds it  difficult to ambulate for an extended period of time.  She states that she has noted worsening weakness since her last spinal fusion and decompression in December 2017.  Patient has a history of 3 lumbar spine surgeries.  Patient also has a history of cervical spine surgery.Current medications include Wellbutrin 300 mg for depression, Cymbalta 60 mg daily, gabapentin 300 mg nightly.  Higher dose of gabapentin resulted in sedation, abnormal dreams.  Plan:  -UDS reviewed and appropriate.  -Sign opioid agreement -Hydrocodone as below for management of chronic pain. -Continue Cymbalta and Gabapentin as prescribed. -Discussed CT lumbar spine in detail with patient and discussed risks/benefits of L2/L3 ESI on left. Patient would like to proceed. Instructed patient to stop Plavix 7 days prior to scheduled epidural.  Pharmacotherapy (Medications Ordered): Meds ordered this encounter  Medications  . HYDROcodone-acetaminophen (NORCO/VICODIN) 5-325 MG tablet    Sig: Take 1 tablet by mouth 2 (two) times daily as needed for moderate pain. For chronic pain To last for 30 days from fill date    Dispense:  60 tablet    Refill:  0    Do not place this medication, or any other prescription from our practice, on "Automatic Refill". Patient may have prescription filled one day early if pharmacy is closed on scheduled refill date. Do not fill until:  To last until:   Lab-work, procedure(s), and/or referral(s): Orders Placed This Encounter  Procedures  . Lumbar Epidural Injection   Future considerations: lumbar facet medial branch nerve block.  Provider-requested follow-up: Return in about 2 weeks (around 03/09/2018) for Procedure. Time Note: Greater than 50% of the 25 minute(s) of face-to-face time spent with Melody Ochoa, was spent in counseling/coordination of care regarding: Melody Ochoa primary cause of pain, the results of her recent test(s), the significance of each one oth the test(s) anomalies and  it's corresponding characteristic pain pattern(s), the treatment plan, treatment alternatives, the risks and possible complications of proposed treatment, medication side effects, going over the informed consent, the opioid analgesic risks and possible complications, the appropriate use of her medications, realistic expectations and the medication agreement. Future Appointments  Date Time Provider [place]  02/23/2018  3:00 PM [facility]  03/13/2018  9:00 AM Jonnie Truxillo,  Carlus Pavlov, MD [facility] None  03/27/2018  3:10 PM [facility]  03/29/2018  1:30 PM Burnard Hawthorne, FNP [facility]  02/16/2019  2:00 PM O'Brien-Blaney, Bryson Corona, LPN [facility]    Primary Care Physician: Burnard Hawthorne, FNP Location: [facility] Note by: Gillis Santa, M.D Date: 02/23/2018; Time: 2:25 PM  Patient Instructions  1.  Scheduled for left L2/3 epidural steroid injection.  Please stop Plavix 7 days prior to scheduled procedure. 2.  Sign opioid contract 3.  Prescription for hydrocodone   ____________________________________________________________________________________________  Preparing for your procedure (without sedation)  Instructions: . Oral Intake: Do not eat or drink anything for at least 3 hours prior to your procedure. . Transportation: Unless otherwise stated by your physician, you may drive yourself after the procedure. . Blood Pressure Medicine: Take your blood pressure medicine with a sip of water the morning of the procedure. . Blood thinners:  . Diabetics on insulin: Notify the staff so that you can be scheduled 1st case in the morning. If your diabetes requires high dose insulin, take only  of your normal insulin dose the morning of the procedure and notify the staff that you have done so. . Preventing infections: Shower with an antibacterial soap the morning of your procedure.  . Build-up your immune  system: Take 1000 mg of Vitamin C with every meal (3 times a day) the day prior to your procedure. Marland Kitchen Antibiotics: Inform the staff if you have a condition or reason that requires you to take antibiotics before dental procedures. . Pregnancy: If you are pregnant, call and cancel the procedure. . Sickness: If you have a cold, fever, or any active infections, call and cancel the procedure. . Arrival: You must be in the facility at least 30 minutes prior to your scheduled procedure. . Children: Do not bring any children with you. . Dress appropriately: Bring dark clothing that you would not mind if they get stained. . Valuables: Do not bring any jewelry or valuables.  Procedure appointments are reserved for interventional treatments only. Marland Kitchen No Prescription Refills. . No medication changes will be discussed during procedure appointments. . No disability issues will be discussed.  Remember:  Regular Business hours are:  Monday to Thursday 8:00 AM to 4:00 PM  Provider's Schedule: Milinda Pointer, MD:  Procedure days: Tuesday and Thursday 7:30 AM to 4:00 PM  Gillis Santa, MD:  Procedure days: Monday and Wednesday 7:30 AM to 4:00 PM ____________________________________________________________________________________________

## 2018-02-23 NOTE — Progress Notes (Signed)
Carelink Summary Report / Loop Recorder 

## 2018-02-23 NOTE — Patient Instructions (Addendum)
1.  Scheduled for left L2/3 epidural steroid injection.  Please stop Plavix 7 days prior to scheduled procedure. 2.  Sign opioid contract 3.  Prescription for hydrocodone   ____________________________________________________________________________________________  Preparing for your procedure (without sedation)  Instructions: . Oral Intake: Do not eat or drink anything for at least 3 hours prior to your procedure. . Transportation: Unless otherwise stated by your physician, you may drive yourself after the procedure. . Blood Pressure Medicine: Take your blood pressure medicine with a sip of water the morning of the procedure. . Blood thinners:  . Diabetics on insulin: Notify the staff so that you can be scheduled 1st case in the morning. If your diabetes requires high dose insulin, take only  of your normal insulin dose the morning of the procedure and notify the staff that you have done so. . Preventing infections: Shower with an antibacterial soap the morning of your procedure.  . Build-up your immune system: Take 1000 mg of Vitamin C with every meal (3 times a day) the day prior to your procedure. Marland Kitchen Antibiotics: Inform the staff if you have a condition or reason that requires you to take antibiotics before dental procedures. . Pregnancy: If you are pregnant, call and cancel the procedure. . Sickness: If you have a cold, fever, or any active infections, call and cancel the procedure. . Arrival: You must be in the facility at least 30 minutes prior to your scheduled procedure. . Children: Do not bring any children with you. . Dress appropriately: Bring dark clothing that you would not mind if they get stained. . Valuables: Do not bring any jewelry or valuables.  Procedure appointments are reserved for interventional treatments only. Marland Kitchen No Prescription Refills. . No medication changes will be discussed during procedure appointments. . No disability issues will be discussed.  Remember:   Regular Business hours are:  Monday to Thursday 8:00 AM to 4:00 PM  Provider's Schedule: Milinda Pointer, MD:  Procedure days: Tuesday and Thursday 7:30 AM to 4:00 PM  Gillis Santa, MD:  Procedure days: Monday and Wednesday 7:30 AM to 4:00 PM ____________________________________________________________________________________________

## 2018-02-23 NOTE — Progress Notes (Signed)
Safety precautions to be maintained throughout the outpatient stay will include: orient to surroundings, keep bed in low position, maintain call bell within reach at all times, provide assistance with transfer out of bed and ambulation.  

## 2018-02-27 ENCOUNTER — Ambulatory Visit: Payer: Medicare Other | Admitting: Family Medicine

## 2018-02-27 ENCOUNTER — Ambulatory Visit: Payer: Medicare Other

## 2018-02-28 NOTE — Telephone Encounter (Signed)
Patient never returned call  

## 2018-03-02 DIAGNOSIS — R21 Rash and other nonspecific skin eruption: Secondary | ICD-10-CM | POA: Diagnosis not present

## 2018-03-02 DIAGNOSIS — D692 Other nonthrombocytopenic purpura: Secondary | ICD-10-CM | POA: Diagnosis not present

## 2018-03-06 ENCOUNTER — Telehealth: Payer: Self-pay | Admitting: Internal Medicine

## 2018-03-06 NOTE — Telephone Encounter (Signed)
LMOM to return call to [facility]. 

## 2018-03-06 NOTE — Telephone Encounter (Signed)
Patient calling to let the office know she is going to the beach and is unsure of what to do with her device and home monitor system.  Please call to discuss.

## 2018-03-09 NOTE — Telephone Encounter (Signed)
Spoke with pt and informed her that if she was only going to be gone a couple of days she doesn't need to take the home monitor but if she is going to be gone more than 7 days it would be ok to take the monitor. Pt voiced understanding

## 2018-03-10 ENCOUNTER — Other Ambulatory Visit: Payer: Self-pay | Admitting: Student in an Organized Health Care Education/Training Program

## 2018-03-10 ENCOUNTER — Telehealth: Payer: Self-pay | Admitting: Student in an Organized Health Care Education/Training Program

## 2018-03-10 ENCOUNTER — Other Ambulatory Visit: Payer: Self-pay

## 2018-03-10 ENCOUNTER — Ambulatory Visit
Admission: RE | Admit: 2018-03-10 | Discharge: 2018-03-10 | Disposition: A | Discharge status: Home / Self Care | Payer: Medicare Other | Source: Ambulatory Visit | Attending: Student in an Organized Health Care Education/Training Program | Admitting: Student in an Organized Health Care Education/Training Program

## 2018-03-10 DIAGNOSIS — M47896 Other spondylosis, lumbar region: Secondary | ICD-10-CM | POA: Insufficient documentation

## 2018-03-10 DIAGNOSIS — M546 Pain in thoracic spine: Secondary | ICD-10-CM

## 2018-03-10 DIAGNOSIS — M545 Low back pain: Secondary | ICD-10-CM

## 2018-03-10 DIAGNOSIS — M47816 Spondylosis without myelopathy or radiculopathy, lumbar region: Secondary | ICD-10-CM | POA: Diagnosis not present

## 2018-03-10 DIAGNOSIS — M47894 Other spondylosis, thoracic region: Secondary | ICD-10-CM | POA: Insufficient documentation

## 2018-03-10 NOTE — Telephone Encounter (Signed)
Patient states she was at the beach and fell and really hurt her back, in a lot of pain, should she have it checked out today or wait until Monday when she has appt for a procedure with Dr. Holley Raring? Please call

## 2018-03-10 NOTE — Telephone Encounter (Signed)
Verbal order received for lumbar/thoracic xray from Dr Holley Raring.  Patient notified to get xray done and come Monday as scheduled for Epidural and Dr Holley Raring would go over results of xrays and proceed from there.

## 2018-03-10 NOTE — Telephone Encounter (Signed)
Patient states she fell out of the bathtub and feels like  She has really hurt something in her back.  Patient is scheduled for an Epidural Monday.  Call placed to Dr Holley Raring for recommendation.

## 2018-03-13 ENCOUNTER — Ambulatory Visit
Admission: RE | Admit: 2018-03-13 | Discharge: 2018-03-13 | Disposition: A | Discharge status: Home / Self Care | Payer: Medicare Other | Source: Ambulatory Visit | Attending: Student in an Organized Health Care Education/Training Program | Admitting: Student in an Organized Health Care Education/Training Program

## 2018-03-13 ENCOUNTER — Encounter: Payer: Self-pay | Admitting: Student in an Organized Health Care Education/Training Program

## 2018-03-13 ENCOUNTER — Telehealth: Payer: Self-pay | Admitting: Student in an Organized Health Care Education/Training Program

## 2018-03-13 ENCOUNTER — Ambulatory Visit (HOSPITAL_BASED_OUTPATIENT_CLINIC_OR_DEPARTMENT_OTHER): Payer: Medicare Other | Admitting: Student in an Organized Health Care Education/Training Program

## 2018-03-13 DIAGNOSIS — Z885 Allergy status to narcotic agent status: Secondary | ICD-10-CM | POA: Insufficient documentation

## 2018-03-13 DIAGNOSIS — Z7902 Long term (current) use of antithrombotics/antiplatelets: Secondary | ICD-10-CM | POA: Insufficient documentation

## 2018-03-13 DIAGNOSIS — M5416 Radiculopathy, lumbar region: Secondary | ICD-10-CM | POA: Insufficient documentation

## 2018-03-13 DIAGNOSIS — Z79891 Long term (current) use of opiate analgesic: Secondary | ICD-10-CM | POA: Insufficient documentation

## 2018-03-13 DIAGNOSIS — Z981 Arthrodesis status: Secondary | ICD-10-CM | POA: Insufficient documentation

## 2018-03-13 DIAGNOSIS — R1030 Lower abdominal pain, unspecified: Secondary | ICD-10-CM | POA: Diagnosis not present

## 2018-03-13 DIAGNOSIS — Z96641 Presence of right artificial hip joint: Secondary | ICD-10-CM | POA: Insufficient documentation

## 2018-03-13 DIAGNOSIS — Z886 Allergy status to analgesic agent status: Secondary | ICD-10-CM | POA: Insufficient documentation

## 2018-03-13 DIAGNOSIS — Z888 Allergy status to other drugs, medicaments and biological substances status: Secondary | ICD-10-CM | POA: Diagnosis not present

## 2018-03-13 DIAGNOSIS — Z9889 Other specified postprocedural states: Secondary | ICD-10-CM | POA: Diagnosis not present

## 2018-03-13 DIAGNOSIS — Z79899 Other long term (current) drug therapy: Secondary | ICD-10-CM | POA: Diagnosis not present

## 2018-03-13 MED ORDER — ROPIVACAINE HCL 2 MG/ML IJ SOLN
2.0000 mL | Freq: Once | INTRAMUSCULAR | Status: AC
  Administered 2018-03-13: 10 mL via EPIDURAL
  Filled 2018-03-13: qty 10

## 2018-03-13 MED ORDER — LACTATED RINGERS IV SOLN: 1000.0000 mL | Freq: Once | INTRAVENOUS | Status: DC

## 2018-03-13 MED ORDER — DEXAMETHASONE SODIUM PHOSPHATE 10 MG/ML IJ SOLN
10.0000 mg | Freq: Once | INTRAMUSCULAR | Status: AC
  Administered 2018-03-13: 10 mg
  Filled 2018-03-13: qty 1

## 2018-03-13 MED ORDER — IOPAMIDOL (ISOVUE-M 200) INJECTION 41%
10.0000 mL | Freq: Once | INTRAMUSCULAR | Status: AC
  Administered 2018-03-13: 10 mL via EPIDURAL
  Filled 2018-03-13: qty 10

## 2018-03-13 MED ORDER — FENTANYL CITRATE (PF) 100 MCG/2ML IJ SOLN: 25.0000 ug | INTRAMUSCULAR | Status: DC | PRN

## 2018-03-13 MED ORDER — LIDOCAINE HCL (PF) 1 % IJ SOLN
4.5000 mL | Freq: Once | INTRAMUSCULAR | Status: AC
  Administered 2018-03-13: 5 mL
  Filled 2018-03-13: qty 5

## 2018-03-13 MED ORDER — BACLOFEN 10 MG PO TABS: 10.0000 mg | ORAL_TABLET | Freq: Two times a day (BID) | ORAL | 0 refills | Status: DC | PRN

## 2018-03-13 MED ORDER — SODIUM CHLORIDE 0.9% FLUSH
2.0000 mL | Freq: Once | INTRAVENOUS | Status: AC
Start: 2018-03-13 — End: 2018-03-13
  Administered 2018-03-13: 10 mL

## 2018-03-13 NOTE — Patient Instructions (Addendum)
You may restart your Plavix tomorrow. Pain Management Discharge Instructions  General Discharge Instructions :  If you need to reach your doctor call: Monday-Friday 8:00 am - 4:00 pm at [phone] or toll free [phone].  After clinic hours [phone] to have operator reach doctor.  Bring all of your medication bottles to all your appointments in the pain clinic.  To cancel or reschedule your appointment with Pain Management please remember to call 24 hours in advance to avoid a fee.  Refer to the educational materials which you have been given on: General Risks, I had my Procedure. Discharge Instructions, Post Sedation.  Post Procedure Instructions:   Please notify your doctor immediately if you have any unusual bleeding, trouble breathing or pain that is not related to your normal pain.  Depending on the type of procedure that was done, some parts of your body may feel week and/or numb.  This usually clears up by tonight or the next day.  Walk with the use of an assistive device or accompanied by an adult for the 24 hours.  You may use ice on the affected area for the first 24 hours.  Put ice in a Ziploc bag and cover with a towel and place against area 15 minutes on 15 minutes off.  You may switch to heat after 24 hours.  A prescription for Baclofen was sent to your pharmacy.

## 2018-03-13 NOTE — Telephone Encounter (Signed)
Patient wants to know if she can go back to swimming in the morning?

## 2018-03-13 NOTE — Progress Notes (Signed)
Safety precautions to be maintained throughout the outpatient stay will include: orient to surroundings, keep bed in low position, maintain call bell within reach at all times, provide assistance with transfer out of bed and ambulation.  

## 2018-03-13 NOTE — Progress Notes (Signed)
Patient's Name: Melody Ochoa  MRN: [id number]  Referring Provider: Gillis Santa, MD  DOB: [birth year]  PCP: Burnard Hawthorne, FNP  DOS: 03/13/2018  Note by: Gillis Santa, MD  Service setting: Ambulatory outpatient  Specialty: Interventional Pain Management  Patient type: Established  Location: [facility] (AMB) Pain Management Facility  Visit type: Interventional Procedure   Primary Reason for Visit: Interventional Pain Management Treatment. CC: Back Pain (lower left) and Groin Pain (left)  Procedure:       Anesthesia, Analgesia, Anxiolysis:  Type: Therapeutic Inter-Laminar Epidural Steroid Injection #1  Region: Lumbar Level: L2-3 Level. Laterality: Left-Sided         Type: Moderate (Conscious) Sedation combined with Local Anesthesia Indication(s): Analgesia and Anxiety Route: Intravenous (IV) IV Access: Secured Sedation: Meaningful verbal contact was maintained at all times during the procedure  Local Anesthetic: Lidocaine 1-2%   Indications: 1. Lumbar radiculopathy    Pain Score: Pre-procedure: 6 /10 Post-procedure: 2 /10  Pre-op Assessment:  [name] is a 77 y.o. (year old), female patient, seen today for interventional treatment. She  has a past surgical history that includes Tonsillectomy; Facelift (94); Cervical disc surgery (09); Back surgery (13); Colonoscopy with propofol (N/A, 01/13/2016); Total hip arthroplasty (Right, 04/22/2016); TEE without cardioversion (N/A, 08/12/2017); LOOP RECORDER INSERTION (N/A, 08/23/2017); Breast excisional biopsy (Left, 90's); and Breast biopsy (Bilateral). [name] has a current medication list which includes the following prescription(s): atorvastatin, bupropion, calcium carbonate-vitamin d, clopidogrel, cyanocobalamin, duloxetine, ferrous sulfate, gabapentin, hydrocodone-acetaminophen, mupirocin ointment, triamcinolone cream, vitamin c, and baclofen, and the following Facility-Administered Medications: fentanyl and lactated ringers. Her primarily  concern today is the Back Pain (lower left) and Groin Pain (left)  77 year old female with a history of posterior L3-L5 spinal fusion and decompression on 12/03/2016 who presents with axial low back pain that is worse on the left that radiates into her left groin and buttock secondary to chronic lumbar radiculopathy here for left L2/3 epidural steroid injection.  Of note patient [place].  She  is endorsing acute on chronic axial low back pain that is also present on the right which was not the case before.  Patient did have lumbar and thoracic spine x-rays performed on 03/10/2018.  Results reviewed today.  Did not show any evidence of acute abnormality such as fracture or hardware abnormality.  Patient stop Plavix 7 days ago.  We will proceed with left L2/3 ESI.    Initial Vital Signs:  Pulse Rate: 84 Temp: 97.8 F (36.6 C) Resp: 16 BP: 124/67 SpO2: 96 %  BMI: Estimated body mass index is 30.76 kg/m as calculated from the following:   Height as of this encounter: 5' 4.5" (1.638 m).   Weight as of this encounter: 182 lb (82.6 kg).  Risk Assessment: Allergies: Reviewed. She is allergic to aleve [naproxen sodium]; aspirin; celebrex [celecoxib]; effexor [venlafaxine]; hydrocodone-homatropine; ibuprofen; tape; vioxx [rofecoxib]; other; and tramadol.  Allergy Precautions: None required Coagulopathies: Reviewed. None identified.  Blood-thinner therapy: None at this time Active Infection(s): Reviewed. None identified. [name] is afebrile  Site Confirmation: Ms. Hetzer was asked to confirm the procedure and laterality before marking the site Procedure checklist: Completed Consent: Before the procedure and under the influence of no sedative(s), amnesic(s), or anxiolytics, the patient was informed of the treatment options, risks and possible complications. To fulfill our ethical and legal obligations, as recommended by the American Medical  Association's Code of Ethics, I have informed the patient of my  clinical impression; the nature and purpose of the treatment or procedure; the risks, benefits, and possible complications of the intervention; the alternatives, including doing nothing; the risk(s) and benefit(s) of the alternative treatment(s) or procedure(s); and the risk(s) and benefit(s) of doing nothing. The patient was provided information about the general risks and possible complications associated with the procedure. These may include, but are not limited to: failure to achieve desired goals, infection, bleeding, organ or nerve damage, allergic reactions, paralysis, and death. In addition, the patient was informed of those risks and complications associated to Spine-related procedures, such as failure to decrease pain; infection (i.e.: Meningitis, epidural or intraspinal abscess); bleeding (i.e.: epidural hematoma, subarachnoid hemorrhage, or any other type of intraspinal or peri-dural bleeding); organ or nerve damage (i.e.: Any type of peripheral nerve, nerve root, or spinal cord injury) with subsequent damage to sensory, motor, and/or autonomic systems, resulting in permanent pain, numbness, and/or weakness of one or several areas of the body; allergic reactions; (i.e.: anaphylactic reaction); and/or death. Furthermore, the patient was informed of those risks and complications associated with the medications. These include, but are not limited to: allergic reactions (i.e.: anaphylactic or anaphylactoid reaction(s)); adrenal axis suppression; blood sugar elevation that in diabetics may result in ketoacidosis or comma; water retention that in patients with history of congestive heart failure may result in shortness of breath, pulmonary edema, and decompensation with resultant heart failure; weight gain; swelling or edema; medication-induced neural toxicity; particulate matter embolism and blood vessel occlusion with resultant organ, and/or  nervous system infarction; and/or aseptic necrosis of one or more joints. Finally, the patient was informed that Medicine is not an exact science; therefore, there is also the possibility of unforeseen or unpredictable risks and/or possible complications that may result in a catastrophic outcome. The patient indicated having understood very clearly. We have given the patient no guarantees and we have made no promises. Enough time was given to the patient to ask questions, all of which were answered to the patient's satisfaction. Ms. Dossantos has indicated that she wanted to continue with the procedure. Attestation: I, the ordering provider, attest that I have discussed with the patient the benefits, risks, side-effects, alternatives, likelihood of achieving goals, and potential problems during recovery for the procedure that I have provided informed consent. Date  Time: 03/13/2018  9:07 AM  Pre-Procedure Preparation:  Monitoring: As per clinic protocol. Respiration, ETCO2, SpO2, BP, heart rate and rhythm monitor placed and checked for adequate function Safety Precautions: Patient was assessed for positional comfort and pressure points before starting the procedure. Time-out: I initiated and conducted the "Time-out" before starting the procedure, as per protocol. The patient was asked to participate by confirming the accuracy of the "Time Out" information. Verification of the correct person, site, and procedure were performed and confirmed by me, the nursing staff, and the patient. "Time-out" conducted as per Joint Commission's Universal Protocol (UP.01.01.01). Time: 0948  Description of Procedure:       Position: Prone with head of the table was raised to facilitate breathing. Target Area: The interlaminar space, initially targeting the lower laminar border of the superior vertebral body. Approach: Paramedial approach. Area Prepped: Entire Posterior Lumbar Region Prepping solution: ChloraPrep (2%  chlorhexidine gluconate and 70% isopropyl alcohol) Safety Precautions: Aspiration looking for blood return was conducted prior to all injections. At no point did we inject any substances, as a needle was being advanced. No attempts were made at seeking any paresthesias. Safe injection practices and needle disposal techniques used. Medications  properly checked for expiration dates. SDV (single dose vial) medications used. Description of the Procedure: Protocol guidelines were followed. The procedure needle was introduced through the skin, ipsilateral to the reported pain, and advanced to the target area. Bone was contacted and the needle walked caudad, until the lamina was cleared. The epidural space was identified using "loss-of-resistance technique" with 2-3 ml of PF-NaCl (0.9% NSS), in a 5cc LOR glass syringe. Vitals:   03/13/18 0914 03/13/18 0948 03/13/18 0952 03/13/18 1000  BP: 124/67 134/82 130/75 118/73  Pulse: 84     Resp: 16 12 13 16   Temp: 97.8 F (36.6 C)     TempSrc: Oral     SpO2: 96% 97% 98% 95%  Weight: 182 lb (82.6 kg)     Height: 5' 4.5" (1.638 m)       Start Time: 0948 hrs. End Time: 0955 hrs. Materials:  Needle(s) Type: Epidural needle Gauge: 17G Length: 3.5-in Medication(s): Please see orders for medications and dosing details. 8 CC solution made of 5 cc of preservative-free saline, 2 cc of 0.2% ropivacaine, 1 cc of Decadron 10 mg/cc Imaging Guidance (Spinal):  Type of Imaging Technique: Fluoroscopy Guidance (Spinal) Indication(s): Assistance in needle guidance and placement for procedures requiring needle placement in or near specific anatomical locations not easily accessible without such assistance. Exposure Time: Please see nurses notes. Contrast: Before injecting any contrast, we confirmed that the patient did not have an allergy to iodine, shellfish, or radiological contrast. Once satisfactory needle placement was completed at the desired level, radiological  contrast was injected. Contrast injected under live fluoroscopy. No contrast complications. See chart for type and volume of contrast used. Fluoroscopic Guidance: I was personally present during the use of fluoroscopy. "Tunnel Vision Technique" used to obtain the best possible view of the target area. Parallax error corrected before commencing the procedure. "Direction-depth-direction" technique used to introduce the needle under continuous pulsed fluoroscopy. Once target was reached, antero-posterior, oblique, and lateral fluoroscopic projection used confirm needle placement in all planes. Images permanently stored in EMR. Interpretation: I personally interpreted the imaging intraoperatively. Adequate needle placement confirmed in multiple planes. Appropriate spread of contrast into desired area was observed. No evidence of afferent or efferent intravascular uptake. No intrathecal or subarachnoid spread observed. Permanent images saved into the patient's record.  Antibiotic Prophylaxis:   Anti-infectives (From admission, onward)   None     Indication(s): None identified  Post-operative Assessment:  Post-procedure Vital Signs:  Pulse Rate: 84 Temp: 97.8 F (36.6 C) Resp: 16 BP: 118/73 SpO2: 95 %  EBL: None  Complications: No immediate post-treatment complications observed by team, or reported by patient.  Note: The patient tolerated the entire procedure well. A repeat set of vitals were taken after the procedure and the patient was kept under observation following institutional policy, for this type of procedure. Post-procedural neurological assessment was performed, showing return to baseline, prior to discharge. The patient was provided with post-procedure discharge instructions, including a section on how to identify potential problems. Should any problems arise concerning this procedure, the patient was given instructions to immediately contact us, at any time, without hesitation. In any  case, we plan to contact the patient by telephone for a follow-up status report regarding this interventional procedure.  Comments:  No additional relevant information. 5 out of 5 strength bilateral lower extremity: Plantar flexion, dorsiflexion, knee flexion, knee extension.  Plan of Care   Imaging Orders     DG C-Arm 1-60 Min-No Report Procedure Orders  No procedure(s) ordered today   Patient instructed to resume Plavix tomorrow after nursing staff calls for postprocedural evaluation.  As long as patient is not having any new lower externally weakness, patient instructed to restart Plavix 24 hours after procedure. Medications ordered for procedure: Meds ordered this encounter  Medications  . lactated ringers infusion 1,000 mL  . fentaNYL (SUBLIMAZE) injection 25-100 mcg    Make sure Narcan is available in the pyxis when using this medication. In the event of respiratory depression (RR< 8/min): Titrate NARCAN (naloxone) in increments of 0.1 to 0.2 mg IV at 2-3 minute intervals, until desired degree of reversal.  . iopamidol (ISOVUE-M) 41 % intrathecal injection 10 mL  . ropivacaine (PF) 2 mg/mL (0.2%) (NAROPIN) injection 2 mL  . sodium chloride flush (NS) 0.9 % injection 2 mL  . dexamethasone (DECADRON) injection 10 mg  . lidocaine (PF) (XYLOCAINE) 1 % injection 4.5 mL  . baclofen (LIORESAL) 10 MG tablet    Sig: Take 1 tablet (10 mg total) by mouth 2 (two) times daily as needed for muscle spasms.    Dispense:  60 tablet    Refill:  0    Do not place this medication, or any other prescription from our practice, on "Automatic Refill". Patient may have prescription filled one day early if pharmacy is closed on scheduled refill date.   Medications administered: We administered iopamidol, ropivacaine (PF) 2 mg/mL (0.2%), sodium chloride flush, dexamethasone, and lidocaine (PF).  See the medical record for exact dosing, route, and time of administration.  New Prescriptions   BACLOFEN  (LIORESAL) 10 MG TABLET    Take 1 tablet (10 mg total) by mouth 2 (two) times daily as needed for muscle spasms.   Disposition: Discharge home  Discharge Date & Time: 03/13/2018; 1005 hrs.   Physician-requested Follow-up: Return in about 3 weeks (around 04/03/2018) for Post Procedure Evaluation.  Future Appointments  Date Time Provider [place]  03/27/2018  3:10 PM [facility] DEVICE REMOTES [facility]  03/29/2018  1:30 PM Burnard Hawthorne, FNP [facility]  04/06/2018 12:00 PM Gillis Santa, MD [facility] None  02/16/2019  2:00 PM O'Brien-Blaney, Bryson Corona, LPN [facility]   Primary Care Physician: Burnard Hawthorne, FNP Location: [facility] Note by: Gillis Santa, MD Date: 03/13/2018; Time: 10:32 AM  Disclaimer:  Medicine is not an exact science. The only guarantee in medicine is that nothing is guaranteed. It is important to note that the decision to proceed with this intervention was based on the information collected from the patient. The Data and conclusions were drawn from the patient's questionnaire, the interview, and the physical examination. Because the information was provided in large part by the patient, it cannot be guaranteed that it has not been purposely or unconsciously manipulated. Every effort has been made to obtain as much relevant data as possible for this evaluation. It is important to note that the conclusions that lead to this procedure are derived in large part from the available data. Always take into account that the treatment will also be dependent on availability of resources and existing treatment guidelines, considered by other Pain Management Practitioners as being common knowledge and practice, at the time of the intervention. For Medico-Legal purposes, it is also important to point out that variation in procedural techniques and pharmacological choices are the acceptable norm. The indications, contraindications,  technique, and results of the above procedure should only be interpreted and judged by a Board-Certified Interventional Pain Specialist with  extensive familiarity and expertise in the same exact procedure and technique.

## 2018-03-14 NOTE — Telephone Encounter (Signed)
No answer. LVM. Ok per Dr. Holley Raring for patient to return to swimming if no post-procedure concerns.

## 2018-03-27 ENCOUNTER — Ambulatory Visit (INDEPENDENT_AMBULATORY_CARE_PROVIDER_SITE_OTHER): Payer: Medicare Other | Admitting: *Deleted

## 2018-03-27 DIAGNOSIS — I63219 Cerebral infarction due to unspecified occlusion or stenosis of unspecified vertebral arteries: Secondary | ICD-10-CM

## 2018-03-27 NOTE — Progress Notes (Signed)
Carelink Summary Report / Loop Recorder 

## 2018-03-28 ENCOUNTER — Ambulatory Visit: Payer: Medicare Other | Admitting: Family

## 2018-03-29 ENCOUNTER — Ambulatory Visit: Payer: Medicare Other | Admitting: Family

## 2018-03-31 ENCOUNTER — Encounter: Payer: Self-pay | Admitting: Family

## 2018-03-31 ENCOUNTER — Ambulatory Visit (INDEPENDENT_AMBULATORY_CARE_PROVIDER_SITE_OTHER): Payer: Medicare Other | Admitting: Family

## 2018-03-31 VITALS — BP 130/80 | HR 84 | Temp 98.1°F | Wt 201.5 lb

## 2018-03-31 DIAGNOSIS — I700 Atherosclerosis of aorta: Secondary | ICD-10-CM

## 2018-03-31 DIAGNOSIS — G8929 Other chronic pain: Secondary | ICD-10-CM | POA: Diagnosis not present

## 2018-03-31 DIAGNOSIS — M545 Low back pain, unspecified: Secondary | ICD-10-CM

## 2018-03-31 DIAGNOSIS — R911 Solitary pulmonary nodule: Secondary | ICD-10-CM | POA: Diagnosis not present

## 2018-03-31 DIAGNOSIS — R918 Other nonspecific abnormal finding of lung field: Secondary | ICD-10-CM | POA: Diagnosis not present

## 2018-03-31 DIAGNOSIS — E785 Hyperlipidemia, unspecified: Secondary | ICD-10-CM

## 2018-03-31 DIAGNOSIS — R21 Rash and other nonspecific skin eruption: Secondary | ICD-10-CM | POA: Diagnosis not present

## 2018-03-31 DIAGNOSIS — I63219 Cerebral infarction due to unspecified occlusion or stenosis of unspecified vertebral arteries: Secondary | ICD-10-CM | POA: Diagnosis not present

## 2018-03-31 NOTE — Assessment & Plan Note (Signed)
Unchanged. Do not suspect of infectious origin. Patient declines imaging or referral to dermatology. She will contact Dr Nehemiah Massed herself. She will let me know if she has any problems in doing so.

## 2018-03-31 NOTE — Assessment & Plan Note (Signed)
Former smoker. Left lung nodule. Incomplete work up from pulmonology. Consulting another pulmonologist per patient preference. Pending CT chest.

## 2018-03-31 NOTE — Assessment & Plan Note (Signed)
Follows with Cari Caraway and [employer]. Discussed weight gain with patient. We jointly agreed that wih optimization of pain, and increase of exercise, would hope to see weight loss. Will follow.

## 2018-03-31 NOTE — Patient Instructions (Addendum)
Call Dr Nehemiah Massed regarding knot on left pointer finger. Call me if any issues with seeing him.   Monitor weight and hopefully injections with Dr Holley Raring will help and you can get back in pool...   Come back for fasting labs.   Today we discussed referrals, orders. Pulmonology, CT chest   I have placed these orders in the system for you.  Please be sure to give [place] a call if you have not heard from our office regarding scheduling a test or regarding referral in a timely manner.  It is very important that you let me know as soon as possible.   For post menopausal women, guidelines recommend a diet with 1200 mg of Calcium per day. If you are eating calcium rich foods, you do not need a calcium supplement. The body better absorbs the calcium that you eat over supplementation. If you do supplement, I recommend not supplementing the full 1200 mg/ day as this can lead to increased risk of cardiovascular disease. I recommend Calcium Citrate over the counter, and you may take a total of 600 to 800 mg per day in divided doses with meals for best absorption.   For bone health, you need adequate vitamin D, and I recommend you supplement as it is harder to do so with diet alone. I recommend cholecalciferol 800 units daily.  Also, please ensure you are following a diet high in calcium -- research shows better outcomes with dietary sources including kale, yogurt, broccolii, cheese, okra, almonds- to name a few.     Also remember that exercise is a great medicine for maintain and preserve bone health. Advise moderate exercise for 30 minutes , 3 times per week.

## 2018-03-31 NOTE — Assessment & Plan Note (Signed)
Pending lipid panel. On lipitor. No cardiac complaints at this time. Will follow

## 2018-03-31 NOTE — Progress Notes (Signed)
Subjective:    Patient ID: Melody Ochoa, female    DOB: 03/31/1941, 77 y.o.   MRN: [id number]  CC: Melody Ochoa is a 77 y.o. female who presents today for follow up.   HPI: Doing well today. No new complaints.   Left index finger pustule , unchanged with antibiotic. Had resolved for some time and now returned. Continues have clear fluid from blister.   Has been back to see Dr Cari Caraway regarding low back pain 02/2018. Also following with the pain clinic Dr Holley Raring, hydrocodone, baclofen .   Takes 300mg  gabapentin at bedtime for back pain, on occasion will take with hydrocodone for relief. No falls.Able to sleep however not overly drowsy per patient.   H/o Pulmonolgy nodule Former smoker. No cough, sob, wheezing  HLD and atherosclerosis - on lipitor.   Unable to exercise due to back pain, suspects why gained weight. Used to swim.  BP at home 130/80. NO CP.    Declines DEXA. On calcium and vitamin D     Saw Dr Raul Del 09/2017 for left upper lung nodule and chronic scarring. No SOB.  Didn't do sniff test.   CT chest 08/2017 athero 1.3 cm sub solid pulmonary nodule Repeat CT chest 6 months  HISTORY:  Past Medical History:  Diagnosis Date  . Arthritis   . Atheromatous plaque 10/18/2017  . Back pain   . Cancer ([place])    skin  . Depression   . Diverticulosis   . Dizziness    patient had episode of dizziness when came in room. hx past no dx  . GERD (gastroesophageal reflux disease)   . Heart murmur   . Hyperlipidemia 10/18/2017  . PONV (postoperative nausea and vomiting)    yrs ago  . Stroke [facility])    Past Surgical History:  Procedure Laterality Date  . BACK SURGERY  13  . BREAST BIOPSY Bilateral    cores "years ago"  . BREAST EXCISIONAL BIOPSY Left 90's  . CERVICAL DISC SURGERY  09  . COLONOSCOPY WITH PROPOFOL N/A 01/13/2016   Procedure: COLONOSCOPY WITH PROPOFOL;  Surgeon: [name], MD;  Location: [facility] ENDOSCOPY;  Service: Endoscopy;  Laterality: N/A;    . FACELIFT  94  . LOOP RECORDER INSERTION N/A 08/23/2017   Procedure: LOOP RECORDER INSERTION;  Surgeon: Thompson Grayer, MD;  Location: [place] [facility];  Service: Cardiovascular;  Laterality: N/A;  . TEE WITHOUT CARDIOVERSION N/A 08/12/2017   Procedure: TRANSESOPHAGEAL ECHOCARDIOGRAM (TEE);  Surgeon: Sueanne Margarita, MD;  Location: [facility] ENDOSCOPY;  Service: Cardiovascular;  Laterality: N/A;  . TONSILLECTOMY    . TOTAL HIP ARTHROPLASTY Right 04/22/2016   Procedure: TOTAL HIP ARTHROPLASTY;  Surgeon: Corky Mull, MD;  Location: [facility] ORS;  Service: Orthopedics;  Laterality: Right;   Family History  Problem Relation Age of Onset  . Lung cancer Father   . Aneurysm Brother   . Stroke Brother   . Diabetes Maternal Grandmother   . Kidney disease Maternal Grandfather   . Cushing syndrome Paternal Grandmother   . Dementia Paternal Grandfather   . Breast cancer Maternal Aunt 52    Allergies: Aleve [naproxen sodium]; Aspirin; Celebrex [celecoxib]; Effexor [venlafaxine]; Hydrocodone-homatropine; Ibuprofen; Tape; Vioxx [rofecoxib]; Other; and Tramadol Current Outpatient Medications on File Prior to Visit  Medication Sig Dispense Refill  . atorvastatin (LIPITOR) 80 MG tablet Take 1 tablet (80 mg total) daily at 6 PM by mouth. 90 tablet 3  . baclofen (LIORESAL) 10 MG tablet Take 1 tablet (10 mg  total) by mouth 2 (two) times daily as needed for muscle spasms. 60 tablet 0  . buPROPion (WELLBUTRIN XL) 300 MG 24 hr tablet Take 300 mg by mouth daily.     . Calcium Carbonate-Vitamin D (CALTRATE 600+D PO) Take 1 tablet by mouth daily.    . clopidogrel (PLAVIX) 75 MG tablet Take 1 tablet (75 mg total) by mouth daily. 30 tablet 0  . Cyanocobalamin (VITAMIN B-12 PO) Take 1 tablet by mouth daily.    . DULoxetine (CYMBALTA) 60 MG capsule Take 60 mg by mouth daily.     . ferrous sulfate 325 (65 FE) MG tablet Take 1 tablet (325 mg total) by mouth daily. 30 tablet 0  . gabapentin (NEURONTIN) 300 MG capsule Take 300  mg by mouth at bedtime.   3  . HYDROcodone-acetaminophen (NORCO/VICODIN) 5-325 MG tablet Take 1 tablet by mouth 2 (two) times daily as needed for moderate pain. For chronic pain To last for 30 days from fill date 60 tablet 0  . mupirocin ointment (BACTROBAN) 2 % Apply 1 application topically 2 (two) times daily. 22 g 1  . triamcinolone cream (KENALOG) 0.1 % Apply 1 application topically as needed.  0  . vitamin C (ASCORBIC ACID) 500 MG tablet Take 500 mg by mouth daily.     No current facility-administered medications on file prior to visit.     Social History   Tobacco Use  . Smoking status: Former Smoker    Packs/day: 1.00    Years: 40.00    Pack years: 40.00    Types: Cigarettes    Last attempt to quit: 11/13/1996    Years since quitting: 21.3  . Smokeless tobacco: Never Used  Substance Use Topics  . Alcohol use: Yes    Comment: occ wine  . Drug use: No    Review of Systems  Constitutional: Negative for chills and fever.  Respiratory: Negative for cough, shortness of breath and wheezing.   Cardiovascular: Negative for chest pain and palpitations.  Gastrointestinal: Negative for nausea and vomiting.      Objective:    BP 130/80   Pulse 84   Temp 98.1 F (36.7 C) (Oral)   Wt 201 lb 8 oz (91.4 kg)   SpO2 96%   BMI 34.05 kg/m  BP Readings from Last 3 Encounters:  03/31/18 130/80  03/13/18 118/73  02/23/18 127/68   Wt Readings from Last 3 Encounters:  03/31/18 201 lb 8 oz (91.4 kg)  03/13/18 182 lb (82.6 kg)  02/23/18 182 lb (82.6 kg)    Physical Exam  Constitutional: She appears well-developed and well-nourished.  Eyes: Conjunctivae are normal.  Cardiovascular: Normal rate, regular rhythm, normal heart sounds and normal pulses.  Pulmonary/Chest: Effort normal and breath sounds normal. She has no wheezes. She has no rhonchi. She has no rales.  Neurological: She is alert.  Skin: Skin is warm and dry.  Left index finger nodule proximal to DIP. No increased  eyrthema, warmth. Clear fluid noted  Psychiatric: She has a normal mood and affect. Her speech is normal and behavior is normal. Thought content normal.  Vitals reviewed.      Assessment & Plan:   Problem List Items Addressed This Visit      Cardiovascular and Mediastinum   Atherosclerosis of aorta ([place])    Pending lipid panel. On lipitor. No cardiac complaints at this time. Will follow        Musculoskeletal and Integument   Rash    Unchanged. Do  not suspect of infectious origin. Patient declines imaging or referral to dermatology. She will contact Dr Nehemiah Massed herself. She will let me know if she has any problems in doing so.         Other   Low back pain    Follows with Cari Caraway and [facility]. Discussed weight gain with patient. We jointly agreed that wih optimization of pain, and increase of exercise, would hope to see weight loss. Will follow.       Pulmonary nodules    Former smoker. Left lung nodule. Incomplete work up from pulmonology. Consulting another pulmonologist per patient preference. Pending CT chest.       Hyperlipidemia   Relevant Orders   Lipid panel   Comprehensive metabolic panel    Other Visit Diagnoses    Solitary pulmonary nodule    -  Primary   Relevant Orders   Ambulatory referral to Pulmonology   CT Chest Wo Contrast       I am having [name] maintain her gabapentin, DULoxetine, Calcium Carbonate-Vitamin D (CALTRATE 600+D PO), buPROPion, vitamin C, Cyanocobalamin (VITAMIN B-12 PO), ferrous sulfate, clopidogrel, atorvastatin, mupirocin ointment, triamcinolone cream, HYDROcodone-acetaminophen, and baclofen.   No orders of the defined types were placed in this encounter.   Return precautions given.   Risks, benefits, and alternatives of the medications and treatment plan prescribed today were discussed, and patient expressed understanding.   Education regarding symptom management and diagnosis given to patient on AVS.  Continue  to follow with Burnard Hawthorne, FNP for routine health maintenance.   [name] and I agreed with plan.   [name], FNP

## 2018-04-02 LAB — CUP PACEART REMOTE DEVICE CHECK
Date Time Interrogation Session: 20190320181116
Implantable Pulse Generator Implant Date: 20180918

## 2018-04-04 ENCOUNTER — Encounter: Payer: Self-pay | Admitting: Family

## 2018-04-05 ENCOUNTER — Other Ambulatory Visit: Payer: Medicare Other

## 2018-04-05 ENCOUNTER — Encounter (INDEPENDENT_AMBULATORY_CARE_PROVIDER_SITE_OTHER): Payer: Self-pay

## 2018-04-05 DIAGNOSIS — F325 Major depressive disorder, single episode, in full remission: Secondary | ICD-10-CM | POA: Diagnosis not present

## 2018-04-06 ENCOUNTER — Other Ambulatory Visit: Payer: Self-pay

## 2018-04-06 ENCOUNTER — Ambulatory Visit
Payer: Medicare Other | Attending: Student in an Organized Health Care Education/Training Program | Admitting: Student in an Organized Health Care Education/Training Program

## 2018-04-06 VITALS — BP 145/83 | HR 84 | Temp 97.3°F | Resp 16 | Ht 64.0 in | Wt 199.8 lb

## 2018-04-06 DIAGNOSIS — F329 Major depressive disorder, single episode, unspecified: Secondary | ICD-10-CM | POA: Insufficient documentation

## 2018-04-06 DIAGNOSIS — Z886 Allergy status to analgesic agent status: Secondary | ICD-10-CM | POA: Insufficient documentation

## 2018-04-06 DIAGNOSIS — Z888 Allergy status to other drugs, medicaments and biological substances status: Secondary | ICD-10-CM | POA: Insufficient documentation

## 2018-04-06 DIAGNOSIS — M961 Postlaminectomy syndrome, not elsewhere classified: Secondary | ICD-10-CM

## 2018-04-06 DIAGNOSIS — G459 Transient cerebral ischemic attack, unspecified: Secondary | ICD-10-CM | POA: Diagnosis not present

## 2018-04-06 DIAGNOSIS — M5137 Other intervertebral disc degeneration, lumbosacral region: Secondary | ICD-10-CM | POA: Diagnosis not present

## 2018-04-06 DIAGNOSIS — M545 Low back pain: Secondary | ICD-10-CM | POA: Diagnosis present

## 2018-04-06 DIAGNOSIS — Z885 Allergy status to narcotic agent status: Secondary | ICD-10-CM | POA: Insufficient documentation

## 2018-04-06 DIAGNOSIS — Z79899 Other long term (current) drug therapy: Secondary | ICD-10-CM | POA: Insufficient documentation

## 2018-04-06 DIAGNOSIS — Z9889 Other specified postprocedural states: Secondary | ICD-10-CM

## 2018-04-06 DIAGNOSIS — K219 Gastro-esophageal reflux disease without esophagitis: Secondary | ICD-10-CM | POA: Diagnosis not present

## 2018-04-06 DIAGNOSIS — M4316 Spondylolisthesis, lumbar region: Secondary | ICD-10-CM

## 2018-04-06 DIAGNOSIS — E785 Hyperlipidemia, unspecified: Secondary | ICD-10-CM | POA: Diagnosis not present

## 2018-04-06 DIAGNOSIS — Z87891 Personal history of nicotine dependence: Secondary | ICD-10-CM | POA: Insufficient documentation

## 2018-04-06 DIAGNOSIS — G894 Chronic pain syndrome: Secondary | ICD-10-CM | POA: Diagnosis not present

## 2018-04-06 DIAGNOSIS — M5416 Radiculopathy, lumbar region: Secondary | ICD-10-CM | POA: Diagnosis not present

## 2018-04-06 DIAGNOSIS — Z8673 Personal history of transient ischemic attack (TIA), and cerebral infarction without residual deficits: Secondary | ICD-10-CM | POA: Diagnosis not present

## 2018-04-06 DIAGNOSIS — M5116 Intervertebral disc disorders with radiculopathy, lumbar region: Secondary | ICD-10-CM | POA: Diagnosis not present

## 2018-04-06 NOTE — Progress Notes (Signed)
Patient's Name: Melody Ochoa  MRN: [id number]  Referring Provider: Burnard Hawthorne, FNP  DOB: July 30, 1941  PCP: Burnard Hawthorne, FNP  DOS: 04/06/2018  Note by: Gillis Santa, MD  Service setting: Ambulatory outpatient  Specialty: Interventional Pain Management  Location: [facility] (AMB) Pain Management Facility    Patient type: Established   Primary Reason(s) for Visit: Encounter for post-procedure evaluation of chronic illness with mild to moderate exacerbation CC: Back Pain (lower)  HPI  Melody Ochoa is a 77 y.o. year old, female patient, who comes today for a post-procedure evaluation. She has Spondylolisthesis of lumbar region; UTI (lower urinary tract infection); Low back pain; Personal history of colonic polyps; Status post total replacement of right hip; Stroke (cerebrum) (HCC) - Patchy small volume acute ischemic nonhemorrhagic superior left cerebellar infarct, left SCA territory, of undetermined etiology, with diffuse atherosclerosis; Depression, recurrent ([place]); Pulmonary nodules; Atheromatous plaque; Hyperlipidemia; Rash; Lumbar radiculopathy; History of stroke; Greater trochanteric bursitis of both hips; Degeneration of lumbar or lumbosacral intervertebral disc; Major depression in remission ([place]); Primary osteoarthritis of left hip; and Atherosclerosis of aorta (HCC) on their problem list. Her primarily concern today is the Back Pain (lower)  Pain Assessment: Location:   Back Radiating: left hip Onset: More than a month ago Duration: Chronic pain Quality: Burning, Aching, Nagging, Discomfort Severity: 6 /10 (self-reported pain score)  Note: Reported level is compatible with observation.                         When using our objective Pain Scale, levels between 6 and 10/10 are said to belong in an emergency room, as it progressively worsens from a 6/10, described as severely limiting, requiring emergency care not usually available at an outpatient pain management facility. At a 6/10  level, communication becomes difficult and requires great effort. Assistance to reach the emergency department may be required. Facial flushing and profuse sweating along with potentially dangerous increases in heart rate and blood pressure will be evident. Effect on ADL: getting up and down , prolonged walking Timing: Constant Modifying factors: rest, medication when needed, swim  Melody Ochoa comes in today for post-procedure evaluation after the treatment done on 03/13/2018.  Further details on both, my assessment(s), as well as the proposed treatment plan, please see below.  Post-Procedure Assessment  03/13/2018 Procedure: L2-3 ESI #1 Pre-procedure pain score:  6/10 Post-procedure pain score: 2/10         Influential Factors: BMI: 34.30 kg/m Intra-procedural challenges: None observed.         Assessment challenges: None detected.              Reported side-effects: None.        Post-procedural adverse reactions or complications: None reported         Sedation: Please see nurses note. When no sedatives are used, the analgesic levels obtained are directly associated to the effectiveness of the local anesthetics. However, when sedation is provided, the level of analgesia obtained during the initial 1 hour following the intervention, is believed to be the result of a combination of factors. These factors may include, but are not limited to: 1. The effectiveness of the local anesthetics used. 2. The effects of the analgesic(s) and/or anxiolytic(s) used. 3. The degree of discomfort experienced by the patient at the time of the procedure. 4. The patients ability and reliability in recalling and recording the events. 5. The presence and influence of possible secondary gains and/or psychosocial factors.  Reported result: Relief experienced during the 1st hour after the procedure: 100 % (Ultra-Short Term Relief)            Interpretative annotation: Clinically appropriate result. Analgesia during  this period is likely to be Local Anesthetic and/or IV Sedative (Analgesic/Anxiolytic) related.          Effects of local anesthetic: The analgesic effects attained during this period are directly associated to the localized infiltration of local anesthetics and therefore cary significant diagnostic value as to the etiological location, or anatomical origin, of the pain. Expected duration of relief is directly dependent on the pharmacodynamics of the local anesthetic used. Long-acting (4-6 hours) anesthetics used.  Reported result: Relief during the next 4 to 6 hour after the procedure: 100 % (Short-Term Relief)            Interpretative annotation: Clinically appropriate result. Analgesia during this period is likely to be Local Anesthetic-related.          Long-term benefit: Defined as the period of time past the expected duration of local anesthetics (1 hour for short-acting and 4-6 hours for long-acting). With the possible exception of prolonged sympathetic blockade from the local anesthetics, benefits during this period are typically attributed to, or associated with, other factors such as analgesic sensory neuropraxia, antiinflammatory effects, or beneficial biochemical changes provided by agents other than the local anesthetics.  Reported result: Extended relief following procedure: 50 % (Long-Term Relief)            Interpretative annotation: Clinically appropriate result. Good relief. No permanent benefit expected. Inflammation plays a part in the etiology to the pain.          Current benefits: Defined as reported results that persistent at this point in time.   Analgesia: 25-50 %            Function: Somewhat improved ROM: Somewhat improved Interpretative annotation: Recurrence of symptoms. No permanent benefit expected. Effective diagnostic intervention.          Interpretation: Results would suggest a successful diagnostic intervention.                  Plan:  Repeat treatment or  therapy and compare extent and duration of benefits.                Laboratory Chemistry  Inflammation Markers (CRP: Acute Phase) (ESR: Chronic Phase) No results found for: CRP, ESRSEDRATE, LATICACIDVEN                       Rheumatology Markers No results found for: RF, ANA, LABURIC, URICUR, LYMEIGGIGMAB, [facility]                      Renal Function Markers Lab Results  Component Value Date   BUN 10 08/19/2017   CREATININE 1.01 (H) 08/19/2017   GFRAA >60 08/19/2017   GFRNONAA 52 (L) 08/19/2017                              Hepatic Function Markers Lab Results  Component Value Date   AST 21 08/09/2017   ALT 17 08/09/2017   ALBUMIN 4.3 08/09/2017   ALKPHOS 59 08/09/2017                        Electrolytes Lab Results  Component Value Date   NA 140 08/19/2017   K 3.7 08/19/2017  CL 104 08/19/2017   CALCIUM 9.3 08/19/2017                        Neuropathy Markers Lab Results  Component Value Date   VITAMINB12 334 11/23/2014   FOLATE >20.0 11/23/2014   HGBA1C 5.3 08/10/2017                        Bone Pathology Markers No results found for: VD25OH, [id number], [id number], [id number], 25OHVITD1, 25OHVITD2, 25OHVITD3, TESTOFREE, TESTOSTERONE                       Coagulation Parameters Lab Results  Component Value Date   INR 0.91 08/09/2017   LABPROT 12.2 08/09/2017   APTT 27 08/09/2017   PLT 318 08/19/2017                        Cardiovascular Markers Lab Results  Component Value Date   TROPONINI <0.03 08/19/2017   HGB 11.2 (L) 08/19/2017   HCT 32.9 (L) 08/19/2017                         CA Markers No results found for: CEA, CA125, LABCA2                      Note: Lab results reviewed.  Recent Diagnostic Imaging Results  CUP PACEART REMOTE DEVICE CHECK Carelink summary report received. Battery status OK. Normal device function. No new symptom episodes, tachy episodes, brady, or pause episodes. No new AF episodes. Monthly summary reports and ROV  with JA PRN.Levander Campion BSN, RN, CCDS  Complexity Note: Imaging results reviewed. Results shared with Ms. Vida, using Layman's terms.                         Meds   Current Outpatient Medications:  .  atorvastatin (LIPITOR) 80 MG tablet, Take 1 tablet (80 mg total) daily at 6 PM by mouth., Disp: 90 tablet, Rfl: 3 .  baclofen (LIORESAL) 10 MG tablet, Take 1 tablet (10 mg total) by mouth 2 (two) times daily as needed for muscle spasms., Disp: 60 tablet, Rfl: 0 .  buPROPion (WELLBUTRIN XL) 300 MG 24 hr tablet, Take 300 mg by mouth daily. , Disp: , Rfl:  .  Calcium Carbonate-Vitamin D (CALTRATE 600+D PO), Take 1 tablet by mouth daily., Disp: , Rfl:  .  clopidogrel (PLAVIX) 75 MG tablet, Take 1 tablet (75 mg total) by mouth daily., Disp: 30 tablet, Rfl: 0 .  Cyanocobalamin (VITAMIN B-12 PO), Take 1 tablet by mouth daily., Disp: , Rfl:  .  DULoxetine (CYMBALTA) 60 MG capsule, Take 60 mg by mouth daily. , Disp: , Rfl:  .  ferrous sulfate 325 (65 FE) MG tablet, Take 1 tablet (325 mg total) by mouth daily., Disp: 30 tablet, Rfl: 0 .  gabapentin (NEURONTIN) 300 MG capsule, Take 300 mg by mouth at bedtime. , Disp: , Rfl: 3 .  HYDROcodone-acetaminophen (NORCO/VICODIN) 5-325 MG tablet, Take 1 tablet by mouth 2 (two) times daily as needed for moderate pain. For chronic pain To last for 30 days from fill date, Disp: 60 tablet, Rfl: 0 .  vitamin C (ASCORBIC ACID) 500 MG tablet, Take 500 mg by mouth daily., Disp: , Rfl:  .  mupirocin ointment (BACTROBAN) 2 %, Apply 1 application topically 2 (two)  times daily. (Patient not taking: Reported on 04/06/2018), Disp: 22 g, Rfl: 1 .  triamcinolone cream (KENALOG) 0.1 %, Apply 1 application topically as needed., Disp: , Rfl: 0  ROS  Constitutional: Denies any fever or chills Gastrointestinal: No reported hemesis, hematochezia, vomiting, or acute GI distress Musculoskeletal: Denies any acute onset joint swelling, redness, loss of ROM, or weakness Neurological: No  reported episodes of acute onset apraxia, aphasia, dysarthria, agnosia, amnesia, paralysis, loss of coordination, or loss of consciousness  Allergies  Ms. Twichell is allergic to [employer] sodium]; aspirin; celebrex [celecoxib]; effexor [venlafaxine]; hydrocodone-homatropine; ibuprofen; tape; vioxx [rofecoxib]; other; and tramadol.  [place]  Drug: Ms. Cagley  reports that she does not use drugs. Alcohol:  reports that she drinks alcohol. Tobacco:  reports that she quit smoking about 21 years ago. Her smoking use included cigarettes. She has a 40.00 pack-year smoking history. She has never used smokeless tobacco. Medical:  has a past medical history of Arthritis, Atheromatous plaque (10/18/2017), Back pain, Cancer ([place]), Depression, Diverticulosis, Dizziness, GERD (gastroesophageal reflux disease), Heart murmur, Hyperlipidemia (10/18/2017), PONV (postoperative nausea and vomiting), and Stroke ([place]). Surgical: Ms. Stander  has a past surgical history that includes Tonsillectomy; Facelift (89); Cervical disc surgery (8); Back surgery (12); Colonoscopy with propofol (N/A, 01/13/2016); Total hip arthroplasty (Right, 04/22/2016); TEE without cardioversion (N/A, 08/12/2017); LOOP RECORDER INSERTION (N/A, 08/23/2017); Breast excisional biopsy (Left, 90's); and Breast biopsy (Bilateral). Family: family history includes Aneurysm in her brother; Breast cancer (age of onset: 88) in her maternal aunt; Cushing syndrome in her paternal grandmother; Dementia in her paternal grandfather; Diabetes in her maternal grandmother; Kidney disease in her maternal grandfather; Lung cancer in her father; Stroke in her brother.  Constitutional Exam  General appearance: Well nourished, well developed, and well hydrated. In no apparent acute distress Vitals:   04/06/18 1148  BP: (!) 145/83  Pulse: 84  Resp: 16  Temp: (!) 97.3 F (36.3 C)  SpO2: 100%  Weight: 199 lb 12.8 oz (90.6 kg)  Height: '5\' 4"'$  (1.626 m)   BMI  Assessment: Estimated body mass index is 34.3 kg/m as calculated from the following:   Height as of this encounter: '5\' 4"'$  (1.626 m).   Weight as of this encounter: 199 lb 12.8 oz (90.6 kg).  BMI interpretation table: BMI level Category Range association with higher incidence of chronic pain  <18 kg/m2 Underweight   18.5-24.9 kg/m2 Ideal body weight   25-29.9 kg/m2 Overweight Increased incidence by 20%  30-34.9 kg/m2 Obese (Class I) Increased incidence by 68%  35-39.9 kg/m2 Severe obesity (Class II) Increased incidence by 136%  >40 kg/m2 Extreme obesity (Class III) Increased incidence by 254%   Patient's current BMI Ideal Body weight  Body mass index is 34.3 kg/m. Ideal body weight: 54.7 kg (120 lb 9.5 oz) Adjusted ideal body weight: 69.1 kg (152 lb 4.4 oz)   BMI Readings from Last 4 Encounters:  04/06/18 34.30 kg/m  03/31/18 34.05 kg/m  03/13/18 30.76 kg/m  02/23/18 30.76 kg/m   Wt Readings from Last 4 Encounters:  04/06/18 199 lb 12.8 oz (90.6 kg)  03/31/18 201 lb 8 oz (91.4 kg)  03/13/18 182 lb (82.6 kg)  02/23/18 182 lb (82.6 kg)  Psych/Mental status: Alert, oriented x 3 (person, place, & time)       Eyes: PERLA Respiratory: No evidence of acute respiratory distress  Cervical Spine Area Exam  Skin & Axial Inspection: No masses, redness, edema, swelling, or associated skin lesions Alignment: Symmetrical Functional ROM: Unrestricted  ROM      Stability: No instability detected Muscle Tone/Strength: Functionally intact. No obvious neuro-muscular anomalies detected. Sensory (Neurological): Unimpaired Palpation: No palpable anomalies              Upper Extremity (UE) Exam    Side: Right upper extremity  Side: Left upper extremity  Skin & Extremity Inspection: Skin color, temperature, and hair growth are WNL. No peripheral edema or cyanosis. No masses, redness, swelling, asymmetry, or associated skin lesions. No contractures.  Skin & Extremity Inspection: Skin color,  temperature, and hair growth are WNL. No peripheral edema or cyanosis. No masses, redness, swelling, asymmetry, or associated skin lesions. No contractures.  Functional ROM: Unrestricted ROM          Functional ROM: Unrestricted ROM          Muscle Tone/Strength: Functionally intact. No obvious neuro-muscular anomalies detected.  Muscle Tone/Strength: Functionally intact. No obvious neuro-muscular anomalies detected.  Sensory (Neurological): Unimpaired          Sensory (Neurological): Unimpaired          Palpation: No palpable anomalies              Palpation: No palpable anomalies              Specialized Test(s): Deferred         Specialized Test(s): Deferred          Thoracic Spine Area Exam  Skin & Axial Inspection: No masses, redness, or swelling Alignment: Symmetrical Functional ROM: Unrestricted ROM Stability: No instability detected Muscle Tone/Strength: Functionally intact. No obvious neuro-muscular anomalies detected. Sensory (Neurological): Unimpaired Muscle strength & Tone: No palpable anomalies  Lumbar Spine Area Exam  Skin & Axial Inspection: No masses, redness, or swelling Alignment: Symmetrical Functional ROM: Decreased ROM       Stability: No instability detected Muscle Tone/Strength: Functionally intact. No obvious neuro-muscular anomalies detected. Sensory (Neurological): Dermatomal pain pattern Palpation: No palpable anomalies       Provocative Tests: Lumbar Hyperextension and rotation test: evaluation deferred today       Lumbar Lateral bending test: Positive ipsilateral radicular pain, on the left. Positive for left-sided foraminal stenosis. Patrick's Maneuver: evaluation deferred today                    Gait & Posture Assessment  Ambulation: Patient ambulates using a cane Gait: Antalgic Posture: Difficulty standing up straight, due to pain   Lower Extremity Exam    Side: Right lower extremity  Side: Left lower extremity  Skin & Extremity Inspection: Skin  color, temperature, and hair growth are WNL. No peripheral edema or cyanosis. No masses, redness, swelling, asymmetry, or associated skin lesions. No contractures.  Skin & Extremity Inspection: Skin color, temperature, and hair growth are WNL. No peripheral edema or cyanosis. No masses, redness, swelling, asymmetry, or associated skin lesions. No contractures.  Functional ROM: Unrestricted ROM          Functional ROM: Unrestricted ROM          Muscle Tone/Strength: Functionally intact. No obvious neuro-muscular anomalies detected.  Muscle Tone/Strength: Functionally intact. No obvious neuro-muscular anomalies detected.  Sensory (Neurological): Unimpaired  Sensory (Neurological): Unimpaired  Palpation: No palpable anomalies  Palpation: No palpable anomalies   Assessment  Primary Diagnosis & Pertinent Problem List: The primary encounter diagnosis was Lumbar radiculopathy. Diagnoses of Degeneration of lumbar or lumbosacral intervertebral disc, History of lumbar surgery, Failed back surgical syndrome, Chronic pain syndrome, and Spondylolisthesis of lumbar region were  also pertinent to this visit.  Status Diagnosis  Persistent Persistent Persistent 1. Lumbar radiculopathy   2. Degeneration of lumbar or lumbosacral intervertebral disc   3. History of lumbar surgery   4. Failed back surgical syndrome   5. Chronic pain syndrome   6. Spondylolisthesis of lumbar region     General Recommendations: The pain condition that the patient suffers from is best treated with a multidisciplinary approach that involves an increase in physical activity to prevent de-conditioning and worsening of the pain cycle, as well as psychological counseling (formal and/or informal) to address the co-morbid psychological affects of pain. Treatment will often involve judicious use of pain medications and interventional procedures to decrease the pain, allowing the patient to participate in the physical activity that will  ultimately produce long-lasting pain reductions. The goal of the multidisciplinary approach is to return the patient to a higher level of overall function and to restore their ability to perform activities of daily living.   77 year old female with a history of posterior L3-L5 spinal fusion and decompression on 12/03/2016 who presents with axial low back pain that is worse on the left that radiates into her left groin and buttock. She denies any pain in her lower extremities. She has minimal right-sided pain. She is status post right hip replacement surgery. Of note patient does have a history of a right TIA for which she is on Plavix 75 mg. She states that she has some minor speech deficit and slight left facial droop as a result of the TIA. Patient finds it difficult to ambulate for an extended period of time. She states that she has noted worsening weakness since her last spinal fusion and decompression in December 2017. Patient has a history of 3 lumbar spine surgeries. Patient also has a history of cervical spine surgery.Current medications include Wellbutrin 300 mg for depression, Cymbalta 60 mg daily, gabapentin 300 mg nightly. Higher dose of gabapentin resulted in sedation, abnormal dreams.  Patient returns today for follow-up status post left L2-L3 ESI on 03/13/2018 which provided her with approximately 50 to 60% pain relief for the first 10 days which is now decreased to approximately 30 to 40%.  Patient states that she was able to perform activities of daily living with greater ease and noted improvement in her functional status.  We discussed repeating left L2-L3 ESI #2.  Risks and benefits were discussed.  Patient would like to proceed.  Reminded the patient to stop her Plavix 7 days prior to her scheduled procedure.   Plan of Care  Pharmacotherapy (Medications Ordered): No orders of the defined types were placed in this encounter.  Lab-work, procedure(s), and/or referral(s): Orders  Placed This Encounter  Procedures  . Lumbar Epidural Injection   Provider-requested follow-up: Return in about 2 weeks (around 04/20/2018) for Procedure. Time Note: Greater than 50% of the 25 minute(s) of face-to-face time spent with Ms. Gipson, was spent in counseling/coordination of care regarding: Ms. Renn primary cause of pain, the treatment plan, treatment alternatives, the risks and possible complications of proposed treatment, medication side effects, going over the informed consent and the results, interpretation and significance of  her recent diagnostic interventional treatment(s).  Future Appointments  Date Time Provider [place]  04/07/2018  9:45 AM [facility]  04/11/2018  3:30 PM [facility]  04/19/2018 10:45 AM Gillis Santa, MD [facility] None  05/02/2018  2:55 PM [facility] DEVICE REMOTES [facility]  06/30/2018  1:30 PM Arnett, Yvetta Coder, FNP [facility]  02/16/2019  2:00 PM O'Brien-Blaney, Bryson Corona, LPN [facility]    Primary Care Physician: Burnard Hawthorne, FNP Location[facility] Note by: Gillis Santa, M.D Date: 04/06/2018; Time: 1:22 PM  Patient Instructions     Stop plavix for 7 days   Preparing for your procedure (without sedation) Instructions: . Oral Intake: Do not eat or drink anything for at least 3 hours prior to your procedure. . Transportation: Unless otherwise stated by your physician, you may drive yourself after the procedure. . Blood Pressure Medicine: Take your blood pressure medicine with a sip of water the morning of the procedure. . Insulin: Take only  of your normal insulin dose. . Preventing infections: Shower with an antibacterial soap the morning of your procedure. . Build-up your immune system: Take 1000 mg of Vitamin C with every meal (3 times a day) the day prior to your procedure. . Pregnancy: If you are pregnant, call and cancel the  procedure. . Sickness: If you have a cold, fever, or any active infections, call and cancel the procedure. . Arrival: You must be in the facility at least 30 minutes prior to your scheduled procedure. . Children: Do not bring any children with you. . Dress appropriately: Bring dark clothing that you would not mind if they get stained. . Valuables: Do not bring any jewelry or valuables. Procedure appointments are reserved for interventional treatments only. Marland Kitchen No Prescription Refills. . No medication changes will be discussed during procedure appointments. . No disability issues will be discussed.

## 2018-04-06 NOTE — Progress Notes (Signed)
Safety precautions to be maintained throughout the outpatient stay will include: orient to surroundings, keep bed in low position, maintain call bell within reach at all times, provide assistance with transfer out of bed and ambulation.  

## 2018-04-06 NOTE — Patient Instructions (Addendum)
    Stop plavix for 7 days   Preparing for your procedure (without sedation) Instructions: . Oral Intake: Do not eat or drink anything for at least 3 hours prior to your procedure. . Transportation: Unless otherwise stated by your physician, you may drive yourself after the procedure. . Blood Pressure Medicine: Take your blood pressure medicine with a sip of water the morning of the procedure. . Insulin: Take only  of your normal insulin dose. . Preventing infections: Shower with an antibacterial soap the morning of your procedure. . Build-up your immune system: Take 1000 mg of Vitamin C with every meal (3 times a day) the day prior to your procedure. . Pregnancy: If you are pregnant, call and cancel the procedure. . Sickness: If you have a cold, fever, or any active infections, call and cancel the procedure. . Arrival: You must be in the facility at least 30 minutes prior to your scheduled procedure. . Children: Do not bring any children with you. . Dress appropriately: Bring dark clothing that you would not mind if they get stained. . Valuables: Do not bring any jewelry or valuables. Procedure appointments are reserved for interventional treatments only. Marland Kitchen No Prescription Refills. . No medication changes will be discussed during procedure appointments. . No disability issues will be discussed.

## 2018-04-07 ENCOUNTER — Other Ambulatory Visit (INDEPENDENT_AMBULATORY_CARE_PROVIDER_SITE_OTHER): Payer: Medicare Other

## 2018-04-07 DIAGNOSIS — E785 Hyperlipidemia, unspecified: Secondary | ICD-10-CM | POA: Diagnosis not present

## 2018-04-07 LAB — COMPREHENSIVE METABOLIC PANEL
ALT: 13 U/L (ref 0–35)
AST: 16 U/L (ref 0–37)
Albumin: 4.1 g/dL (ref 3.5–5.2)
Alkaline Phosphatase: 55 U/L (ref 39–117)
BUN: 10 mg/dL (ref 6–23)
CO2: 29 mEq/L (ref 19–32)
Calcium: 9.4 mg/dL (ref 8.4–10.5)
Chloride: 100 mEq/L (ref 96–112)
Creatinine, Ser: 0.94 mg/dL (ref 0.40–1.20)
GFR: 61.23 mL/min (ref >60.00)
Glucose, Bld: 94 mg/dL (ref 70–99)
Potassium: 4.5 mEq/L (ref 3.5–5.1)
Sodium: 136 mEq/L (ref 135–145)
Total Bilirubin: 0.4 mg/dL (ref 0.2–1.2)
Total Protein: 7.1 g/dL (ref 6.0–8.3)

## 2018-04-07 LAB — LIPID PANEL
Cholesterol: 143 mg/dL (ref 0–200)
HDL: 68 mg/dL
LDL Cholesterol: 60 mg/dL (ref 0–99)
NonHDL: 74.53
Total CHOL/HDL Ratio: 2
Triglycerides: 75 mg/dL (ref 0.0–149.0)
VLDL: 15 mg/dL (ref 0.0–40.0)

## 2018-04-11 ENCOUNTER — Ambulatory Visit: Admission: RE | Admit: 2018-04-11 | Payer: Medicare Other | Source: Ambulatory Visit

## 2018-04-11 ENCOUNTER — Ambulatory Visit: Payer: Self-pay | Admitting: *Deleted

## 2018-04-11 ENCOUNTER — Other Ambulatory Visit: Payer: Self-pay

## 2018-04-11 ENCOUNTER — Emergency Department: Payer: Medicare Other

## 2018-04-11 ENCOUNTER — Emergency Department
Admission: EM | Admit: 2018-04-11 | Discharge: 2018-04-11 | Disposition: A | Discharge status: Home / Self Care | Payer: Medicare Other | Attending: Student in an Organized Health Care Education/Training Program | Admitting: Student in an Organized Health Care Education/Training Program

## 2018-04-11 DIAGNOSIS — Y939 Activity, unspecified: Secondary | ICD-10-CM | POA: Insufficient documentation

## 2018-04-11 DIAGNOSIS — Y92003 Bedroom of unspecified non-institutional (private) residence as the place of occurrence of the external cause: Secondary | ICD-10-CM | POA: Insufficient documentation

## 2018-04-11 DIAGNOSIS — M25552 Pain in left hip: Secondary | ICD-10-CM | POA: Diagnosis not present

## 2018-04-11 DIAGNOSIS — W19XXXA Unspecified fall, initial encounter: Secondary | ICD-10-CM

## 2018-04-11 DIAGNOSIS — S79912A Unspecified injury of left hip, initial encounter: Secondary | ICD-10-CM | POA: Diagnosis not present

## 2018-04-11 DIAGNOSIS — S0990XA Unspecified injury of head, initial encounter: Secondary | ICD-10-CM | POA: Diagnosis not present

## 2018-04-11 DIAGNOSIS — Z79899 Other long term (current) drug therapy: Secondary | ICD-10-CM | POA: Insufficient documentation

## 2018-04-11 DIAGNOSIS — Y999 Unspecified external cause status: Secondary | ICD-10-CM | POA: Diagnosis not present

## 2018-04-11 DIAGNOSIS — S199XXA Unspecified injury of neck, initial encounter: Secondary | ICD-10-CM | POA: Diagnosis not present

## 2018-04-11 DIAGNOSIS — Z96641 Presence of right artificial hip joint: Secondary | ICD-10-CM | POA: Diagnosis not present

## 2018-04-11 DIAGNOSIS — Z87891 Personal history of nicotine dependence: Secondary | ICD-10-CM | POA: Diagnosis not present

## 2018-04-11 DIAGNOSIS — W06XXXA Fall from bed, initial encounter: Secondary | ICD-10-CM | POA: Insufficient documentation

## 2018-04-11 DIAGNOSIS — M545 Low back pain: Secondary | ICD-10-CM | POA: Diagnosis not present

## 2018-04-11 MED ORDER — OXYCODONE-ACETAMINOPHEN 5-325 MG PO TABS
1.0000 | ORAL_TABLET | Freq: Once | ORAL | Status: AC
  Administered 2018-04-11: 1 via ORAL
  Filled 2018-04-11: qty 1

## 2018-04-11 NOTE — ED Provider Notes (Signed)
[facility] Emergency Department Provider Note  ____________________________________________  Time seen: Approximately 5:53 PM  I have reviewed the triage vital signs and the nursing notes.   HISTORY  Chief Complaint Hip Pain    HPI Melody Ochoa is a 77 y.o. female presents to the emergency department with left sided lower back pain after patient fell from her bed 1 day ago.  Patient reports that she did hit her head.  She takes Plavix daily.  She has noticed no new blurry vision, nausea, vomiting, disorientation or confusion.  Patient is accompanied by her husband who has noticed no changes in behavior.  Patient has been able to ambulate with some difficulty.  No bowel or bladder incontinence.  Patient denies weakness, radiculopathy or changes in sensation in the upper or lower extremities.   Past Medical History:  Diagnosis Date  . Arthritis   . Atheromatous plaque 10/18/2017  . Back pain   . Cancer ([place])    skin  . Depression   . Diverticulosis   . Dizziness    patient had episode of dizziness when came in room. hx past no dx  . GERD (gastroesophageal reflux disease)   . Heart murmur   . Hyperlipidemia 10/18/2017  . PONV (postoperative nausea and vomiting)    yrs ago  . Stroke [facility])     Patient Active Problem List   Diagnosis Date Noted  . Atherosclerosis of aorta ([place]) 03/31/2018  . Rash 01/16/2018  . Atheromatous plaque 10/18/2017  . Hyperlipidemia 10/18/2017  . Depression, recurrent ([place]) 09/27/2017  . Pulmonary nodules 09/27/2017  . History of stroke 09/12/2017  . Stroke (cerebrum) (HCC) - Patchy small volume acute ischemic nonhemorrhagic superior left cerebellar infarct, left SCA territory, of undetermined etiology, with diffuse atherosclerosis 08/10/2017  . Primary osteoarthritis of left hip 02/28/2017  . Status post total replacement of right hip 04/22/2016  . Personal history of colonic polyps   . Greater trochanteric bursitis  of both hips 01/09/2016  . UTI (lower urinary tract infection) 11/22/2014  . Low back pain 11/22/2014  . Spondylolisthesis of lumbar region 11/18/2014  . Lumbar radiculopathy 08/21/2014  . Degeneration of lumbar or lumbosacral intervertebral disc 08/21/2014  . Major depression in remission ([place]) 06/23/2014    Past Surgical History:  Procedure Laterality Date  . BACK SURGERY  13  . BREAST BIOPSY Bilateral    cores "years ago"  . BREAST EXCISIONAL BIOPSY Left 90's  . CERVICAL DISC SURGERY  09  . COLONOSCOPY WITH PROPOFOL N/A 01/13/2016   Procedure: COLONOSCOPY WITH PROPOFOL;  Surgeon: [name], MD;  Location: [facility] ENDOSCOPY;  Service: Endoscopy;  Laterality: N/A;  . FACELIFT  94  . LOOP RECORDER INSERTION N/A 08/23/2017   Procedure: LOOP RECORDER INSERTION;  Surgeon: Thompson Grayer, MD;  Location: [place] CV LAB;  Service: Cardiovascular;  Laterality: N/A;  . TEE WITHOUT CARDIOVERSION N/A 08/12/2017   Procedure: TRANSESOPHAGEAL ECHOCARDIOGRAM (TEE);  Surgeon: Sueanne Margarita, MD;  Location: [facility] ENDOSCOPY;  Service: Cardiovascular;  Laterality: N/A;  . TONSILLECTOMY    . TOTAL HIP ARTHROPLASTY Right 04/22/2016   Procedure: TOTAL HIP ARTHROPLASTY;  Surgeon: Corky Mull, MD;  Location: [facility] ORS;  Service: Orthopedics;  Laterality: Right;    Prior to Admission medications   Medication Sig Start Date End Date Taking? Authorizing Provider  atorvastatin (LIPITOR) 80 MG tablet Take 1 tablet (80 mg total) daily at 6 PM by mouth. 10/18/17   Deboraha Sprang, MD  baclofen (LIORESAL) 10 MG  tablet Take 1 tablet (10 mg total) by mouth 2 (two) times daily as needed for muscle spasms. 03/13/18   Gillis Santa, MD  buPROPion (WELLBUTRIN XL) 300 MG 24 hr tablet Take 300 mg by mouth daily.  07/15/17   [provider]  Calcium Carbonate-Vitamin D (CALTRATE 600+D PO) Take 1 tablet by mouth daily.    [provider]  clopidogrel (PLAVIX) 75 MG tablet Take 1 tablet (75 mg total) by mouth  daily. 08/13/17   Patteson, Arlan Organ, NP  Cyanocobalamin (VITAMIN B-12 PO) Take 1 tablet by mouth daily.    [provider]  DULoxetine (CYMBALTA) 60 MG capsule Take 60 mg by mouth daily.     [provider]  ferrous sulfate 325 (65 FE) MG tablet Take 1 tablet (325 mg total) by mouth daily. 08/12/17   Patteson, Arlan Organ, NP  gabapentin (NEURONTIN) 300 MG capsule Take 300 mg by mouth at bedtime.  09/01/14   [provider]  HYDROcodone-acetaminophen (NORCO/VICODIN) 5-325 MG tablet Take 1 tablet by mouth 2 (two) times daily as needed for moderate pain. For chronic pain To last for 30 days from fill date 02/23/18   Gillis Santa, MD  mupirocin ointment (BACTROBAN) 2 % Apply 1 application topically 2 (two) times daily. Patient not taking: Reported on 04/06/2018 01/16/18   Burnard Hawthorne, FNP  triamcinolone cream (KENALOG) 0.1 % Apply 1 application topically as needed. 02/03/18   [provider]  vitamin C (ASCORBIC ACID) 500 MG tablet Take 500 mg by mouth daily.    [provider]    Allergies Aleve [naproxen sodium]; Aspirin; Celebrex [celecoxib]; Effexor [venlafaxine]; Hydrocodone-homatropine; Ibuprofen; Tape; Vioxx [rofecoxib]; Other; and Tramadol  Family History  Problem Relation Age of Onset  . Lung cancer Father   . Aneurysm Brother   . Stroke Brother   . Diabetes Maternal Grandmother   . Kidney disease Maternal Grandfather   . Cushing syndrome Paternal Grandmother   . Dementia Paternal Grandfather   . Breast cancer Maternal Aunt 52    Social History Social History   Tobacco Use  . Smoking status: Former Smoker    Packs/day: 1.00    Years: 40.00    Pack years: 40.00    Types: Cigarettes    Last attempt to quit: 11/13/1996    Years since quitting: 21.4  . Smokeless tobacco: Never Used  Substance Use Topics  . Alcohol use: Yes    Comment: occ wine  . Drug use: No     Review of Systems  Constitutional: No fever/chills Eyes: No  visual changes. No discharge ENT: No upper respiratory complaints. Cardiovascular: no chest pain. Respiratory: no cough. No SOB. Gastrointestinal: No abdominal pain.  No nausea, no vomiting.  No diarrhea.  No constipation. Musculoskeletal: Patient has low back pain.  Skin: Negative for rash, abrasions, lacerations, ecchymosis. Neurological: Negative for headaches, focal weakness or numbness.   ____________________________________________   PHYSICAL EXAM:  VITAL SIGNS: ED Triage Vitals  Enc Vitals Group     BP 04/11/18 1452 (!) 147/79     Pulse Rate 04/11/18 1452 75     Resp 04/11/18 1452 18     Temp 04/11/18 1452 97.9 F (36.6 C)     Temp Source 04/11/18 1452 Oral     SpO2 04/11/18 1452 94 %     Weight 04/11/18 1449 182 lb (82.6 kg)     Height 04/11/18 1449 5\' 4"  (1.626 m)     Head Circumference --  Peak Flow --      Pain Score 04/11/18 1449 10     Pain Loc --      Pain Edu? --      Excl. in [place]? --      Constitutional: Alert and oriented. Well appearing and in no acute distress. Eyes: Conjunctivae are normal. PERRL. EOMI. Head: Atraumatic. ENT:      Ears: TMs are pearly      Nose: No congestion/rhinnorhea.      Mouth/Throat: Mucous membranes are moist.  Neck: No stridor.  No cervical spine tenderness to palpation. Cardiovascular: Normal rate, regular rhythm. Normal S1 and S2.  Good peripheral circulation. Respiratory: Normal respiratory effort without tachypnea or retractions. Lungs CTAB. Good air entry to the bases with no decreased or absent breath sounds. Musculoskeletal: Full range of motion to all extremities. No gross deformities appreciated.  Patient has tenderness appreciated along the paraspinal muscles of the lumbar spine especially on the left.  Palpable radial, ulnar and dorsalis pedis pulses bilaterally and symmetrically. Neurologic:  Normal speech and language. No gross focal neurologic deficits are appreciated.  Skin:  Skin is warm, dry and intact.  No rash noted. Psychiatric: Mood and affect are normal. Speech and behavior are normal. Patient exhibits appropriate insight and judgement.   ____________________________________________   LABS (all labs ordered are listed, but only abnormal results are displayed)  Labs Reviewed - No data to display ____________________________________________  EKG   ____________________________________________  RADIOLOGY Unk Pinto, personally viewed and evaluated these images (plain radiographs) as part of my medical decision making, as well as reviewing the written report by the radiologist.  Dg Lumbar Spine 2-3 Views  Result Date: 04/11/2018 CLINICAL DATA:  Recent fall with pelvic and low back pain, initial encounter EXAM: LUMBAR SPINE - 3 VIEW COMPARISON:  03/13/2018 FINDINGS: Five lumbar type vertebral bodies are well visualized. Changes of prior interbody fusion at L3-4 and L4-5 with posterior fixation are seen. Degenerative changes are noted in the upper lumbar spine with mild scoliosis concave to the left. Changes of prior right hip replacement are noted. Diffuse aortic calcifications are seen. IMPRESSION: Postsurgical and degenerative changes without acute abnormality. Electronically Signed   By: Inez Catalina M.D.   On: 04/11/2018 17:28   Ct Head Wo Contrast  Result Date: 04/11/2018 CLINICAL DATA:  77 year old female status post fall out of bed on the floor and left hip at 0200 hours yesterday. On blood thinners. EXAM: CT HEAD WITHOUT CONTRAST CT CERVICAL SPINE WITHOUT CONTRAST TECHNIQUE: Multidetector CT imaging of the head and cervical spine was performed following the standard protocol without intravenous contrast. Multiplanar CT image reconstructions of the cervical spine were also generated. COMPARISON:  CTA head and neck 08/09/2017, brain MRI 08/10/2017. FINDINGS: CT HEAD FINDINGS Brain: Stable chronic encephalomalacia in the left inferior parietal and superior occipital lobes, and to a  lesser extent left cerebellum. Stable gray-white matter differentiation throughout the brain. No midline shift, ventriculomegaly, mass effect, evidence of mass lesion, intracranial hemorrhage or evidence of cortically based acute infarction. Vascular: Calcified atherosclerosis at the skull base. No suspicious intracranial vascular hyperdensity. Skull: Stable, No acute osseous abnormality identified. Sinuses/Orbits: Visualized paranasal sinuses and mastoids are stable and well pneumatized. Other: No acute orbit or scalp soft tissue finding. CT CERVICAL SPINE FINDINGS Alignment: Stable since 2018. Overall straightening of cervical lordosis but mild chronic spondylolisthesis at C3-C4, C6-C7 and to a lesser extent C7-T1. Posterior element alignment is stable. Skull base and vertebrae: Visualized skull base is  intact. No atlanto-occipital dissociation. No acute cervical spine fracture identified. Chronic postoperative changes described below. Soft tissues and spinal canal: No prevertebral fluid or swelling. No visible canal hematoma. Disc levels: Previous posterior decompression at the C5 and C6 levels. Chronic bilateral C5 and C6 posterior laminar hardware appears stable. Superimposed severe chronic intermittent facet arthropathy elsewhere. Widespread chronic disc space loss and endplate spurring. Chronic mild spinal stenosis at the C1 odontoid level related to degenerative ligamentous hypertrophy. Upper chest: There is a small right C7 cervical rib. The visible upper thoracic levels appear stable and intact. Advanced chronic T1-T2 disc and endplate degeneration. Stable lung apices with fibrosis and scarring greater on the right. Calcified aortic atherosclerosis. The noncontrast thoracic inlet appears stable. IMPRESSION: 1. No acute traumatic injury identified in the head or cervical spine. 2. Non contrast CT appearance of the brain is stable since September 2018. Chronic ischemia in the left PCA and cerebellar artery  territories. 3. Chronic cervical spine postoperative changes and spinal degeneration. Electronically Signed   By: Genevie Ann M.D.   On: 04/11/2018 16:18   Ct Cervical Spine Wo Contrast  Result Date: 04/11/2018 CLINICAL DATA:  77 year old female status post fall out of bed on the floor and left hip at 0200 hours yesterday. On blood thinners. EXAM: CT HEAD WITHOUT CONTRAST CT CERVICAL SPINE WITHOUT CONTRAST TECHNIQUE: Multidetector CT imaging of the head and cervical spine was performed following the standard protocol without intravenous contrast. Multiplanar CT image reconstructions of the cervical spine were also generated. COMPARISON:  CTA head and neck 08/09/2017, brain MRI 08/10/2017. FINDINGS: CT HEAD FINDINGS Brain: Stable chronic encephalomalacia in the left inferior parietal and superior occipital lobes, and to a lesser extent left cerebellum. Stable gray-white matter differentiation throughout the brain. No midline shift, ventriculomegaly, mass effect, evidence of mass lesion, intracranial hemorrhage or evidence of cortically based acute infarction. Vascular: Calcified atherosclerosis at the skull base. No suspicious intracranial vascular hyperdensity. Skull: Stable, No acute osseous abnormality identified. Sinuses/Orbits: Visualized paranasal sinuses and mastoids are stable and well pneumatized. Other: No acute orbit or scalp soft tissue finding. CT CERVICAL SPINE FINDINGS Alignment: Stable since 2018. Overall straightening of cervical lordosis but mild chronic spondylolisthesis at C3-C4, C6-C7 and to a lesser extent C7-T1. Posterior element alignment is stable. Skull base and vertebrae: Visualized skull base is intact. No atlanto-occipital dissociation. No acute cervical spine fracture identified. Chronic postoperative changes described below. Soft tissues and spinal canal: No prevertebral fluid or swelling. No visible canal hematoma. Disc levels: Previous posterior decompression at the C5 and C6 levels.  Chronic bilateral C5 and C6 posterior laminar hardware appears stable. Superimposed severe chronic intermittent facet arthropathy elsewhere. Widespread chronic disc space loss and endplate spurring. Chronic mild spinal stenosis at the C1 odontoid level related to degenerative ligamentous hypertrophy. Upper chest: There is a small right C7 cervical rib. The visible upper thoracic levels appear stable and intact. Advanced chronic T1-T2 disc and endplate degeneration. Stable lung apices with fibrosis and scarring greater on the right. Calcified aortic atherosclerosis. The noncontrast thoracic inlet appears stable. IMPRESSION: 1. No acute traumatic injury identified in the head or cervical spine. 2. Non contrast CT appearance of the brain is stable since September 2018. Chronic ischemia in the left PCA and cerebellar artery territories. 3. Chronic cervical spine postoperative changes and spinal degeneration. Electronically Signed   By: Genevie Ann M.D.   On: 04/11/2018 16:18   Dg Hip Unilat With Pelvis 2-3 Views Left  Result Date: 04/11/2018 CLINICAL DATA:  77 year old female status post fall out of bed at 0200 hours yesterday with left hip pain. EXAM: DG HIP (WITH OR WITHOUT PELVIS) 2-3V LEFT COMPARISON:  Left hip series 08/18/2016. CT lumbar spine 02/15/2018. FINDINGS: Chronic right bipolar hip arthroplasty. Partially visible lumbar fusion hardware. The pelvis appears stable and intact. The visible proximal right femur appears stable. The left acetabulum and proximal left femur appears stable and intact. Chronic calcified iliac and left femoral artery atherosclerosis. IMPRESSION: No acute fracture or dislocation identified about the left hip or pelvis. Electronically Signed   By: Genevie Ann M.D.   On: 04/11/2018 16:22    ____________________________________________    PROCEDURES  Procedure(s) performed:    Procedures    Medications  oxyCODONE-acetaminophen (PERCOCET/ROXICET) 5-325 MG per tablet 1 tablet  (1 tablet Oral Given 04/11/18 1741)     ____________________________________________   INITIAL IMPRESSION / ASSESSMENT AND PLAN / ED COURSE  Pertinent labs & imaging results that were available during my care of the patient were reviewed by me and considered in my medical decision making (see chart for details).  Review of the [place] CSRS was performed in accordance of the [place] prior to dispensing any controlled drugs.     Assessment and plan Fall  Differential diagnosis included skull fracture, subdural hematoma, cervical spine fracture and left hip fracture.  Work-up conducted in the emergency department was reassuring without evidence of subdural hematoma, skull fracture, cervical spine fracture or left hip fracture.  Patient was given a Roxicet in the emergency department.  No medications were prescribed at discharge as patient is currently under the care of pain management.  She was advised to contact pain management regarding any changes in her pain management regimen.  Vital signs are reassuring prior to discharge.    ____________________________________________  FINAL CLINICAL IMPRESSION(S) / ED DIAGNOSES  Final diagnoses:  Fall, initial encounter      NEW MEDICATIONS STARTED DURING THIS VISIT:  ED Discharge Orders    None          This chart was dictated using voice recognition software/Dragon. Despite best efforts to proofread, errors can occur which can change the meaning. Any change was purely unintentional.    Lannie Fields, PA-C 04/11/18 1757    Merlyn Lot, MD 04/11/18 Despina Pole

## 2018-04-11 NOTE — Telephone Encounter (Signed)
Returned patients call and she stated that she did fall and is having pain and does not want to go to the ED.I  Informed her that there are no opening in the office. She states that she would like to see if she could see ortho at [facility]. I called and there were no openings in [place] but opening in [place] she states that is to far to drive and that she will go to the ED.

## 2018-04-11 NOTE — ED Triage Notes (Signed)
Pt states yesterday morning fell on L hip about 2am, states fell out of bed and fell on floor. Takes blood thinner- plavix. States hit back of head. Alert, oriented. In wheelchair, has cane. States has been walking "very little." sitting in wheelchair, just dull ache in back. No distress noted.

## 2018-04-11 NOTE — Telephone Encounter (Signed)
Pt states she fell Sunday am around 1 or 2 am and hurt her hip. Pt states her hip continues to get worse, and now she can not walk without assistance from her husband. Pt did not want to stay on the phone, asked for a call back/. Pt feels like she odes need a xray. Pt states she has never had pain like this before     Patient is calling to reports she fell out of bed during the night and she hurt her hip and shoulder. She is unable to walk without assistance from her husband. Advised patient she needs to be seen because she could have a fracture. We are limited with the X-rays that can be performed at the office so she will need to be evaluated at the ED. Patient does not want to wait- she is going to call her orthopedic first and if she can't be seen by them- then she will go to ED.   Reason for Disposition . Can't stand (bear weight) or walk  Answer Assessment - Initial Assessment Questions 1. MECHANISM: "How did the injury happen?" (e.g., twisting injury, direct blow)      Monday early morning- patient fell out of bed 2. ONSET: "When did the injury happen?" (Minutes or hours ago)      1-3 am 3. LOCATION: "Where is the injury located?"      L hip and shoulder 4. APPEARANCE of INJURY: "What does the injury look like?"  (e.g., deformity of leg)     bruise on hip, possible swelling L clavicle  5. SEVERITY: "Can you put weight on that leg?" "Can you walk?"      Very little- patient has pain- patient can not walk 6. SIZE: For cuts, bruises, or swelling, ask: "How large is it?" (e.g., inches or centimeters;  entire joint)      Bruise on hip- elongated size of Rx bottle 7. PAIN: "Is there pain?" If so, ask: "How bad is the pain?"  (e.g., Scale 1-10; or mild, moderate, severe)     Yes- severe when moves 8. TETANUS: For any breaks in the skin, ask: "When was the last tetanus booster?"     no 9. OTHER SYMPTOMS: "Do you have any other symptoms?"      no 10. PREGNANCY: "Is there any chance you  are pregnant?" "When was your last menstrual period?"       n/a  Protocols used: HIP INJURY-A-AH

## 2018-04-11 NOTE — ED Notes (Signed)
Pt states she feel out of her bed while asleep and hit her head and shoulder and hip. Pt states her bed is really high.

## 2018-04-12 ENCOUNTER — Telehealth: Payer: Self-pay | Admitting: *Deleted

## 2018-04-12 ENCOUNTER — Telehealth: Payer: Self-pay | Admitting: Student in an Organized Health Care Education/Training Program

## 2018-04-12 NOTE — Telephone Encounter (Signed)
-----   Message from Gillis Santa, MD sent at 04/12/2018  1:15 PM EDT ----- Regarding: ok for patient to increase meds due to acute on chronic pain That is fine, she can increase her meds

## 2018-04-12 NOTE — Telephone Encounter (Signed)
Dr. Lateef, please advise. 

## 2018-04-12 NOTE — Telephone Encounter (Signed)
Patient fell yesterday and is hurting on her left side, hip and shoulder. Went to Emergency, they only gave her medication while she was there, one pill. She wants to know if she can increase her current meds from Dr. Holley Raring. Please call patient asap

## 2018-04-12 NOTE — Telephone Encounter (Signed)
Attempted to calll patient, message left.

## 2018-04-13 NOTE — Telephone Encounter (Signed)
Attempted to call patient, message left. 

## 2018-04-17 ENCOUNTER — Emergency Department: Payer: Medicare Other

## 2018-04-17 ENCOUNTER — Emergency Department
Admission: EM | Admit: 2018-04-17 | Discharge: 2018-04-24 | Disposition: A | Discharge status: Home / Self Care | Payer: Medicare Other | Attending: Emergency Medicine | Admitting: Emergency Medicine

## 2018-04-17 ENCOUNTER — Encounter: Payer: Self-pay | Admitting: Emergency Medicine

## 2018-04-17 DIAGNOSIS — Z8673 Personal history of transient ischemic attack (TIA), and cerebral infarction without residual deficits: Secondary | ICD-10-CM | POA: Insufficient documentation

## 2018-04-17 DIAGNOSIS — M545 Low back pain: Secondary | ICD-10-CM | POA: Diagnosis not present

## 2018-04-17 DIAGNOSIS — M25552 Pain in left hip: Secondary | ICD-10-CM | POA: Diagnosis not present

## 2018-04-17 DIAGNOSIS — R1032 Left lower quadrant pain: Secondary | ICD-10-CM

## 2018-04-17 DIAGNOSIS — Z96641 Presence of right artificial hip joint: Secondary | ICD-10-CM | POA: Diagnosis not present

## 2018-04-17 DIAGNOSIS — M79605 Pain in left leg: Secondary | ICD-10-CM | POA: Diagnosis not present

## 2018-04-17 DIAGNOSIS — Z85828 Personal history of other malignant neoplasm of skin: Secondary | ICD-10-CM | POA: Diagnosis not present

## 2018-04-17 DIAGNOSIS — Z87891 Personal history of nicotine dependence: Secondary | ICD-10-CM | POA: Insufficient documentation

## 2018-04-17 DIAGNOSIS — S79912A Unspecified injury of left hip, initial encounter: Secondary | ICD-10-CM | POA: Diagnosis not present

## 2018-04-17 DIAGNOSIS — S3993XA Unspecified injury of pelvis, initial encounter: Secondary | ICD-10-CM | POA: Diagnosis not present

## 2018-04-17 LAB — URINALYSIS, COMPLETE (UACMP) WITH MICROSCOPIC
Bacteria, UA: NONE SEEN
Bilirubin Urine: NEGATIVE
Glucose, UA: NEGATIVE mg/dL
Hgb urine dipstick: NEGATIVE
Ketones, ur: NEGATIVE mg/dL
Leukocytes, UA: NEGATIVE
Nitrite: NEGATIVE
Protein, ur: NEGATIVE mg/dL
Specific Gravity, Urine: 1.013 (ref 1.005–1.030)
pH: 6 (ref 5.0–8.0)

## 2018-04-17 LAB — COMPREHENSIVE METABOLIC PANEL
ALT: 17 U/L (ref 14–54)
AST: 19 U/L (ref 15–41)
Albumin: 4.2 g/dL (ref 3.5–5.0)
Alkaline Phosphatase: 64 U/L (ref 38–126)
Anion gap: 6 (ref 5–15)
BUN: 12 mg/dL (ref 6–20)
CO2: 30 mmol/L (ref 22–32)
Calcium: 8.8 mg/dL — ABNORMAL LOW (ref 8.9–10.3)
Chloride: 99 mmol/L — ABNORMAL LOW (ref 101–111)
Creatinine, Ser: 0.84 mg/dL (ref 0.44–1.00)
GFR calc Af Amer: >60 mL/min (ref >60)
GFR calc non Af Amer: >60 mL/min (ref >60)
Glucose, Bld: 109 mg/dL — ABNORMAL HIGH (ref 65–99)
Potassium: 3.7 mmol/L (ref 3.5–5.1)
Sodium: 135 mmol/L (ref 135–145)
Total Bilirubin: 0.4 mg/dL (ref 0.3–1.2)
Total Protein: 7.4 g/dL (ref 6.5–8.1)

## 2018-04-17 LAB — CBC
HCT: 34.3 % — ABNORMAL LOW (ref 35.0–47.0)
Hemoglobin: 11.6 g/dL — ABNORMAL LOW (ref 12.0–16.0)
MCH: 29.7 pg (ref 26.0–34.0)
MCHC: 33.9 g/dL (ref 32.0–36.0)
MCV: 87.6 fL (ref 80.0–100.0)
Platelets: 299 10*3/uL (ref 150–440)
RBC: 3.92 MIL/uL (ref 3.80–5.20)
RDW: 13.8 % (ref 11.5–14.5)
WBC: 7.2 10*3/uL (ref 3.6–11.0)

## 2018-04-17 MED ORDER — OXYCODONE HCL 5 MG PO TABS
5.0000 mg | ORAL_TABLET | Freq: Once | ORAL | Status: AC
Start: 2018-04-17 — End: 2018-04-17
  Administered 2018-04-17: 5 mg via ORAL
  Filled 2018-04-17: qty 1

## 2018-04-17 MED ORDER — OXYCODONE HCL 5 MG PO TABS: 5.0000 mg | ORAL_TABLET | Freq: Four times a day (QID) | ORAL | 0 refills | Status: DC | PRN

## 2018-04-17 MED ORDER — ACETAMINOPHEN 500 MG PO TABS
1000.0000 mg | ORAL_TABLET | Freq: Once | ORAL | Status: AC
  Administered 2018-04-17: 1000 mg via ORAL
  Filled 2018-04-17: qty 2

## 2018-04-17 MED ORDER — FENTANYL CITRATE (PF) 100 MCG/2ML IJ SOLN
75.0000 ug | Freq: Once | INTRAMUSCULAR | Status: AC
  Administered 2018-04-17: 75 ug via INTRAVENOUS

## 2018-04-17 MED ORDER — IOPAMIDOL (ISOVUE-370) INJECTION 76%
75.0000 mL | Freq: Once | INTRAVENOUS | Status: AC | PRN
  Administered 2018-04-17: 75 mL via INTRAVENOUS

## 2018-04-17 MED ORDER — ACETAMINOPHEN 500 MG PO TABS: 1000.0000 mg | ORAL_TABLET | Freq: Three times a day (TID) | ORAL | 2 refills | Status: DC | PRN

## 2018-04-17 MED ORDER — SODIUM CHLORIDE 0.9 % IV BOLUS: 1000.0000 mL | Freq: Once | INTRAVENOUS | Status: DC

## 2018-04-17 NOTE — ED Provider Notes (Signed)
[facility] Emergency Department Provider Note  ____________________________________________  Time seen: Approximately 11:45 AM  I have reviewed the triage vital signs and the nursing notes.   HISTORY  Chief Complaint Back Pain    HPI Melody Ochoa is a 77 y.o. female with a history of chronic back pain and remote sciatica, remote diverticulitis, on Plavix for history of stroke, presenting with left back, left leg and left hip pain, and left lower quadrant pain.  The patient was seen here 04/11/2018 for pain after fall, with reassuring CT of the head and C-spine, left hip x-ray, lumbar spine x-rays.  She was discharged home and took hydrocodone for the pain, which only helped a little and she feels that her pain has worsened over the last several days.  She describes a "dull ache" that starts in the left lower back and radiates to the left lateral thigh.  No saddle anesthesia, urinary or fecal incontinence or retention, or difficulty walking.  She additionally has noticed some left lower quadrant pain without any constipation, diarrhea, nausea or vomiting, or dark stools.  She denies any dysuria, urinary frequency, fevers or chills.  Past Medical History:  Diagnosis Date  . Arthritis   . Atheromatous plaque 10/18/2017  . Back pain   . Cancer ([place])    skin  . Depression   . Diverticulosis   . Dizziness    patient had episode of dizziness when came in room. hx past no dx  . GERD (gastroesophageal reflux disease)   . Heart murmur   . Hyperlipidemia 10/18/2017  . PONV (postoperative nausea and vomiting)    yrs ago  . Stroke [facility])     Patient Active Problem List   Diagnosis Date Noted  . Atherosclerosis of aorta ([place]) 03/31/2018  . Rash 01/16/2018  . Atheromatous plaque 10/18/2017  . Hyperlipidemia 10/18/2017  . Depression, recurrent ([place]) 09/27/2017  . Pulmonary nodules 09/27/2017  . History of stroke 09/12/2017  . Stroke (cerebrum) ([facility]) - Patchy  small volume acute ischemic nonhemorrhagic superior left cerebellar infarct, left SCA territory, of undetermined etiology, with diffuse atherosclerosis 08/10/2017  . Primary osteoarthritis of left hip 02/28/2017  . Status post total replacement of right hip 04/22/2016  . Personal history of colonic polyps   . Greater trochanteric bursitis of both hips 01/09/2016  . UTI (lower urinary tract infection) 11/22/2014  . Low back pain 11/22/2014  . Spondylolisthesis of lumbar region 11/18/2014  . Lumbar radiculopathy 08/21/2014  . Degeneration of lumbar or lumbosacral intervertebral disc 08/21/2014  . Major depression in remission ([place]) 06/23/2014    Past Surgical History:  Procedure Laterality Date  . BACK SURGERY  13  . BREAST BIOPSY Bilateral    cores "years ago"  . BREAST EXCISIONAL BIOPSY Left 90's  . CERVICAL DISC SURGERY  09  . COLONOSCOPY WITH PROPOFOL N/A 01/13/2016   Procedure: COLONOSCOPY WITH PROPOFOL;  Surgeon: [name], MD;  Location: [facility] ENDOSCOPY;  Service: Endoscopy;  Laterality: N/A;  . FACELIFT  94  . LOOP RECORDER INSERTION N/A 08/23/2017   Procedure: LOOP RECORDER INSERTION;  Surgeon: Thompson Grayer, MD;  Location: [place] CV LAB;  Service: Cardiovascular;  Laterality: N/A;  . TEE WITHOUT CARDIOVERSION N/A 08/12/2017   Procedure: TRANSESOPHAGEAL ECHOCARDIOGRAM (TEE);  Surgeon: Sueanne Margarita, MD;  Location: [facility] ENDOSCOPY;  Service: Cardiovascular;  Laterality: N/A;  . TONSILLECTOMY    . TOTAL HIP ARTHROPLASTY Right 04/22/2016   Procedure: TOTAL HIP ARTHROPLASTY;  Surgeon: Corky Mull, MD;  Location: [facility] ORS;  Service: Orthopedics;  Laterality: Right;    Current Outpatient Rx  . Order #: [id number] Class: Normal  . Order #: [id number] Class: Historical Med  . Order #: [id number] Class: Historical Med  . Order #: [id number] Class: Normal  . Order #: [id number] Class: Historical Med  . Order #: [id number] Class: Historical Med  . Order #: [id number] Class: Print  . Order #:  [id number] Class: Historical Med  . Order #: [id number] Class: Print  . Order #: [id number] Class: Normal  . Order #: [id number] Class: Print  . Order #: [id number] Class: Normal  . Order #: [id number] Class: Print  . Order #: [id number] Class: Historical Med    Allergies Aleve [naproxen sodium]; Aspirin; Celebrex [celecoxib]; Effexor [venlafaxine]; Hydrocodone-homatropine; Ibuprofen; Plavix [clopidogrel bisulfate]; Tape; Vioxx [rofecoxib]; Other; and Tramadol  Family History  Problem Relation Age of Onset  . Lung cancer Father   . Aneurysm Brother   . Stroke Brother   . Diabetes Maternal Grandmother   . Kidney disease Maternal Grandfather   . Cushing syndrome Paternal Grandmother   . Dementia Paternal Grandfather   . Breast cancer Maternal Aunt 52    Social History Social History   Tobacco Use  . Smoking status: Former Smoker    Packs/day: 1.00    Years: 40.00    Pack years: 40.00    Types: Cigarettes    Last attempt to quit: 11/13/1996    Years since quitting: 21.4  . Smokeless tobacco: Never Used  Substance Use Topics  . Alcohol use: Yes    Comment: occ wine  . Drug use: No    Review of Systems Constitutional: No fever/chills.  No lightheadedness or syncope.  Positive fall from the bed 04/11/2018. Eyes: No visual changes.  No blurred or double vision. ENT: No sore throat. No congestion or rhinorrhea. Cardiovascular: Denies chest pain. Denies palpitations. Respiratory: Denies shortness of breath.  No cough. Gastrointestinal: Positive left lower quadrant abdominal pain.  No nausea, no vomiting.  No diarrhea.  No constipation.  No dark stools. Genitourinary: Negative for dysuria.  Urinary frequency. Musculoskeletal: No new midline neck or back pain.  Positive left lower back pain with left lateral thigh pain.  Positive left hip pain. Skin: Negative for rash. Neurological: Negative for headaches. No focal numbness, tingling or weakness.  Continues to be able to  ambulate.    ____________________________________________   PHYSICAL EXAM:  VITAL SIGNS: ED Triage Vitals [04/17/18 1021]  Enc Vitals Group     BP 109/61     Pulse Rate 80     Resp      Temp 98.4 F (36.9 C)     Temp Source Oral     SpO2 98 %     Weight 182 lb (82.6 kg)     Height 5\' 4"  (1.626 m)     Head Circumference      Peak Flow      Pain Score 8     Pain Loc      Pain Edu?      Excl. in [place]?     Constitutional: Alert and oriented.  Chronically ill appearing and in no acute distress. Answers questions appropriately. Eyes: Conjunctivae are normal.  EOMI. No scleral icterus. Head: Atraumatic. Nose: No congestion/rhinnorhea. Mouth/Throat: Mucous membranes are mildly dry.  Neck: No stridor.  Supple.  No midline C-spine tenderness to palpation, step-offs or deformities.  The patient has an old surgical incision in the midline which is well-healed. Cardiovascular: Normal rate, regular rhythm. No murmurs, rubs or gallops.  Respiratory: Normal respiratory  effort.  No accessory muscle use or retractions. Lungs CTAB.  No wheezes, rales or ronchi. Gastrointestinal: Obese.  Soft, and nondistended.  Positive tenderness to palpation in the left lower quadrant.  No guarding or rebound.  No peritoneal signs. Musculoskeletal: Pelvis is stable.  The patient does not have any midline thoracic or lumbar spine tenderness, step-offs or deformities.  She has a remote surgical incision which is well-healed over the lumbar spine.  The patient does have some reproducible tenderness to palpation in the left lower buttock and over the greater trochanter.  Patient has full range of motion without pain of the bilateral ankles and knees, right hip.  She does have some discomfort with range of motion of the left hip.  Normal DP and PT pulses bilaterally.  Normal sensation to light touch throughout the bilateral lower extremity's.  Skin is intact. Neurologic:  A&Ox3.  Speech is clear.  Face and smile are  symmetric.  EOMI.  Moves all extremities well. Skin:  Skin is warm, dry and intact. No rash noted. Psychiatric: Mood and affect are normal. Speech and behavior are normal.  Normal judgement  ____________________________________________   LABS (all labs ordered are listed, but only abnormal results are displayed)  Labs Reviewed  COMPREHENSIVE METABOLIC PANEL - Abnormal; Notable for the following components:      Result Value   Chloride 99 (*)    Glucose, Bld 109 (*)    Calcium 8.8 (*)    All other components within normal limits  CBC - Abnormal; Notable for the following components:   Hemoglobin 11.6 (*)    HCT 34.3 (*)    All other components within normal limits  URINALYSIS, COMPLETE (UACMP) WITH MICROSCOPIC - Abnormal; Notable for the following components:   Color, Urine YELLOW (*)    APPearance CLEAR (*)    All other components within normal limits   ____________________________________________  EKG  Not indicated  ____________________________________________  RADIOLOGY  Ct Abdomen Pelvis W Contrast  Result Date: 04/17/2018 CLINICAL DATA:  Left lower quadrant abdominal pain, low back pain. Fall 1 week ago. EXAM: CT ABDOMEN AND PELVIS WITH CONTRAST TECHNIQUE: Multidetector CT imaging of the abdomen and pelvis was performed using the standard protocol following bolus administration of intravenous contrast. CONTRAST:  57mL ISOVUE-370 IOPAMIDOL (ISOVUE-370) INJECTION 76% COMPARISON:  None FINDINGS: Lower chest: No acute findings. Scarring in the lung bases. No effusions. Hepatobiliary: Scattered hypodensities in the liver most compatible with cysts. No biliary ductal dilatation. Gallbladder unremarkable. Pancreas: No focal abnormality or ductal dilatation. Spleen: No focal abnormality.  Normal size. Adrenals/Urinary Tract: Small nonobstructing stone in the lower pole of the right kidney. No hydronephrosis or ureteral stones. No suspicious focal renal abnormality or adrenal  abnormality. Urinary bladder unremarkable. Stomach/Bowel: Diffuse colonic diverticulosis. No active diverticulitis. Appendix is normal. Stomach and small bowel of grossly unremarkable. No evidence of bowel obstruction. Vascular/Lymphatic: Diffuse aortic and iliac calcifications. No aneurysm or adenopathy. Reproductive: No focal abnormality visualized. Other: No free fluid or free air. Musculoskeletal: Prior right hip replacement. Posterior fusion changes in the lower lumbar spine. No acute bony abnormality. IMPRESSION: Diffuse colonic diverticulosis.  No active diverticulitis. Right lower pole nephrolithiasis. Diffuse aortoiliac atherosclerosis. No acute findings in the abdomen or pelvis. Electronically Signed   By: Rolm Baptise M.D.   On: 04/17/2018 12:48   Ct Hip Left Wo Contrast  Result Date: 04/17/2018 CLINICAL DATA:  Worsening left hip pain after fall a week ago. EXAM: CT OF THE LEFT HIP WITHOUT CONTRAST TECHNIQUE:  Multidetector CT imaging of the left hip was performed according to the standard protocol. Multiplanar CT image reconstructions were also generated. COMPARISON:  Left hip x-rays dated Apr 11, 2018. FINDINGS: Bones/Joint/Cartilage No acute fracture or dislocation. Mild anterior hip joint space narrowing with subchondral cystic change in the acetabulum. No joint effusion. Osteopenia. Ligaments Suboptimally assessed by CT. Muscles and Tendons Atrophy of the left gluteus minimus muscle.  Otherwise unremarkable. Soft tissues Vascular calcifications. No soft tissue mass or fluid collection. Please see separate CT abdomen pelvis report from same day for intrapelvic findings. IMPRESSION: 1.  No acute osseous abnormality. 2. Mild left hip osteoarthritis. Electronically Signed   By: Titus Dubin M.D.   On: 04/17/2018 12:49    ____________________________________________   PROCEDURES  Procedure(s) performed: None  Procedures  Critical Care performed:  No ____________________________________________   INITIAL IMPRESSION / ASSESSMENT AND PLAN / ED COURSE  Pertinent labs & imaging results that were available during my care of the patient were reviewed by me and considered in my medical decision making (see chart for details).  77 y.o. female with a history of chronic cervical and lumbar pain, remote history of sciatica, presenting with ongoing left-sided back pain into the left lower extremity and left lower quadrant pain after fall 04/11/2018.  Overall, the patient is hemodynamically stable.  She is afebrile.  The differential diagnosis for her symptoms includes musculoskeletal pain or strain, sciatica, occult left hip fracture, and diverticulitis.  The patient does not have any signs or symptoms that are consistent with spinal stenosis or cauda equina syndrome.  Her laboratory studies are reassuring with normal electrolytes, normal blood counts, and a urine that does not show UTI.  We will initiate symptom medic treatment, and a CT of the left hip as well as abdomen for further evaluation.  Plan reevaluation for final disposition.  ----------------------------------------- 1:06 PM on 04/17/2018 -----------------------------------------  The patient's work-up in the emergency department has been reassuring.  She is continued to be hemodynamically stable and afebrile.  Her urinalysis does not show UTI.  Her white blood cell count is normal and her electro lites are reassuring.  Her imaging does not show any evidence of diverticulitis or other intra-abdominal process.  Her left hip does not have any evidence of fracture; she does have osteoarthritis.  At this time, I will give the patient instructions for expectant management and symptomatic treatment and have her follow-up with her primary care physician.  She understands return precautions.  ____________________________________________  FINAL CLINICAL IMPRESSION(S) / ED DIAGNOSES  Final  diagnoses:  Left hip pain  Left leg pain  Left lower quadrant pain         NEW MEDICATIONS STARTED DURING THIS VISIT:  New Prescriptions   ACETAMINOPHEN (TYLENOL) 500 MG TABLET    Take 2 tablets (1,000 mg total) by mouth every 8 (eight) hours as needed for mild pain or moderate pain.   OXYCODONE (ROXICODONE) 5 MG IMMEDIATE RELEASE TABLET    Take 1 tablet (5 mg total) by mouth every 6 (six) hours as needed for severe pain.      Eula Listen, MD 04/17/18 [phone]

## 2018-04-17 NOTE — Discharge Instructions (Addendum)
For your pain, you may place ice on the low part of your back for 15 minutes every 2 hours.    You may take Tylenol for mild to moderate pain, and oxycodone for severe pain.  Please make a follow up appointment with your primary care physician.  Return to the emergency department for severe pain, numbness tingling or weakness, changes in bladder or bowel function or for any other symptoms concerning to you.

## 2018-04-17 NOTE — ED Triage Notes (Signed)
Pt reports fell a week ago. Was seen here and has some films done but was told it was nothing. Pt reports the pain is now worse. Pt reports pain is LLQ of abd and her lower back.

## 2018-04-18 ENCOUNTER — Telehealth: Payer: Self-pay

## 2018-04-18 NOTE — Telephone Encounter (Signed)
Copied from [address] [id number]. Topic: Appointment Scheduling - Scheduling Inquiry for Clinic >> Apr 18, 2018  2:04 PM Vernona Rieger wrote: Reason for CRM: Patient was seen in the emergency room on 5/13 and her AVS says for her to be seen within 2 days. Please call patient @ [phone]

## 2018-04-18 NOTE — ED Notes (Signed)
Delay in discharge. Pt was left in OTF by mistake.

## 2018-04-18 NOTE — Telephone Encounter (Signed)
That is fine to see patient that day for left hip pain if she feels comfortable with that-   NOW if patient has severe pain , new or worsening symptoms , she needs to follow up with another provider sooner.   Does she have any follow up with orthopedics ?

## 2018-04-19 ENCOUNTER — Ambulatory Visit: Payer: Medicare Other | Admitting: Student in an Organized Health Care Education/Training Program

## 2018-04-19 ENCOUNTER — Telehealth: Payer: Self-pay

## 2018-04-19 NOTE — Telephone Encounter (Signed)
Im confused here-   She no longer wants an appointment to be seen?   ED note appears to suspect OA of right hip.  Has she she spoken with pain management Dr Holley Raring regarding this?    Appears he was aware on 04/06/18 as his note discussed back pain radiating to left hip, 6/10 pain.   Looks like she is awaiting a call from his office regarding pain medications. Im sorry however pain medications and increase thereof would need to come from him so please advise her to call him again.   Any acute symptoms prior to my seeing her, she would need to go to ED.   Otherwise she may keep her appointment with me if she would like or she may see Dr Holley Raring

## 2018-04-19 NOTE — Telephone Encounter (Signed)
Spoke with patient  Had fall and fell on left side left hip pain is about a 6/10 , patient is to follow up pain management, she was to follow up 04/20/18, however she cancelled and re-scheduled.  Still having left lower quadrant. No trouble urinatiing, ? Unsure if fever, hasn't taken temperature, hot and chills/?  Regular bowels , taking stools softner daily  However stopped taking pain medication.  Pain medication is making her sick.  No hospital follow up.  ? Schedule for acute problem with another provider

## 2018-04-19 NOTE — Telephone Encounter (Signed)
Tried to call patient for ED follow up but she says she just wants to ly there and wait for the pain to go away she has gone to ED twice and nothing has helped tried a second time to schedule ED follow up patient refused .

## 2018-04-19 NOTE — Telephone Encounter (Signed)
Copied from [place] [id number]. Topic: Appointment Scheduling - Scheduling Inquiry for Clinic >> Apr 19, 2018 10:41 AM Synthia Innocent wrote: Reason for CRM: Patient seen in ER on 04/17/18  for back and lower left side pain, requesting to be seen asap. Please advise

## 2018-04-20 NOTE — Telephone Encounter (Signed)
Patient to follow up with pain management.

## 2018-04-25 LAB — CUP PACEART REMOTE DEVICE CHECK
Date Time Interrogation Session: 20190422183737
Implantable Pulse Generator Implant Date: 20180918

## 2018-04-27 ENCOUNTER — Other Ambulatory Visit: Payer: Self-pay | Admitting: Neurosurgery

## 2018-04-27 DIAGNOSIS — IMO0002 Reserved for concepts with insufficient information to code with codable children: Secondary | ICD-10-CM

## 2018-04-27 DIAGNOSIS — M4800 Spinal stenosis, site unspecified: Principal | ICD-10-CM

## 2018-05-02 ENCOUNTER — Ambulatory Visit (INDEPENDENT_AMBULATORY_CARE_PROVIDER_SITE_OTHER): Payer: Medicare Other | Admitting: *Deleted

## 2018-05-02 DIAGNOSIS — I63219 Cerebral infarction due to unspecified occlusion or stenosis of unspecified vertebral arteries: Secondary | ICD-10-CM | POA: Diagnosis not present

## 2018-05-02 NOTE — Progress Notes (Signed)
Carelink Summary Report / Loop Recorder 

## 2018-05-03 ENCOUNTER — Other Ambulatory Visit: Payer: Self-pay

## 2018-05-03 ENCOUNTER — Encounter: Payer: Self-pay | Admitting: Student in an Organized Health Care Education/Training Program

## 2018-05-03 ENCOUNTER — Ambulatory Visit
Payer: Medicare Other | Attending: Student in an Organized Health Care Education/Training Program | Admitting: Student in an Organized Health Care Education/Training Program

## 2018-05-03 ENCOUNTER — Telehealth: Payer: Self-pay | Admitting: *Deleted

## 2018-05-03 VITALS — BP 143/85 | HR 84 | Temp 97.7°F | Resp 18 | Ht 64.0 in | Wt 182.0 lb

## 2018-05-03 DIAGNOSIS — M4316 Spondylolisthesis, lumbar region: Secondary | ICD-10-CM

## 2018-05-03 DIAGNOSIS — Z87891 Personal history of nicotine dependence: Secondary | ICD-10-CM | POA: Diagnosis not present

## 2018-05-03 DIAGNOSIS — R918 Other nonspecific abnormal finding of lung field: Secondary | ICD-10-CM | POA: Insufficient documentation

## 2018-05-03 DIAGNOSIS — Z96641 Presence of right artificial hip joint: Secondary | ICD-10-CM | POA: Insufficient documentation

## 2018-05-03 DIAGNOSIS — Z886 Allergy status to analgesic agent status: Secondary | ICD-10-CM | POA: Insufficient documentation

## 2018-05-03 DIAGNOSIS — R21 Rash and other nonspecific skin eruption: Secondary | ICD-10-CM | POA: Insufficient documentation

## 2018-05-03 DIAGNOSIS — Z86010 Personal history of colonic polyps: Secondary | ICD-10-CM | POA: Insufficient documentation

## 2018-05-03 DIAGNOSIS — K219 Gastro-esophageal reflux disease without esophagitis: Secondary | ICD-10-CM | POA: Insufficient documentation

## 2018-05-03 DIAGNOSIS — N390 Urinary tract infection, site not specified: Secondary | ICD-10-CM | POA: Diagnosis not present

## 2018-05-03 DIAGNOSIS — Z8489 Family history of other specified conditions: Secondary | ICD-10-CM | POA: Insufficient documentation

## 2018-05-03 DIAGNOSIS — Z888 Allergy status to other drugs, medicaments and biological substances status: Secondary | ICD-10-CM | POA: Diagnosis not present

## 2018-05-03 DIAGNOSIS — F329 Major depressive disorder, single episode, unspecified: Secondary | ICD-10-CM | POA: Insufficient documentation

## 2018-05-03 DIAGNOSIS — Z885 Allergy status to narcotic agent status: Secondary | ICD-10-CM | POA: Diagnosis not present

## 2018-05-03 DIAGNOSIS — M5116 Intervertebral disc disorders with radiculopathy, lumbar region: Secondary | ICD-10-CM | POA: Insufficient documentation

## 2018-05-03 DIAGNOSIS — Z8673 Personal history of transient ischemic attack (TIA), and cerebral infarction without residual deficits: Secondary | ICD-10-CM | POA: Diagnosis not present

## 2018-05-03 DIAGNOSIS — M1612 Unilateral primary osteoarthritis, left hip: Secondary | ICD-10-CM | POA: Insufficient documentation

## 2018-05-03 DIAGNOSIS — Z981 Arthrodesis status: Secondary | ICD-10-CM | POA: Insufficient documentation

## 2018-05-03 DIAGNOSIS — M5137 Other intervertebral disc degeneration, lumbosacral region: Secondary | ICD-10-CM

## 2018-05-03 DIAGNOSIS — Z833 Family history of diabetes mellitus: Secondary | ICD-10-CM | POA: Insufficient documentation

## 2018-05-03 DIAGNOSIS — Z9889 Other specified postprocedural states: Secondary | ICD-10-CM

## 2018-05-03 DIAGNOSIS — M51379 Other intervertebral disc degeneration, lumbosacral region without mention of lumbar back pain or lower extremity pain: Secondary | ICD-10-CM

## 2018-05-03 DIAGNOSIS — E785 Hyperlipidemia, unspecified: Secondary | ICD-10-CM | POA: Insufficient documentation

## 2018-05-03 DIAGNOSIS — M5416 Radiculopathy, lumbar region: Secondary | ICD-10-CM | POA: Diagnosis not present

## 2018-05-03 DIAGNOSIS — Z79891 Long term (current) use of opiate analgesic: Secondary | ICD-10-CM | POA: Diagnosis not present

## 2018-05-03 DIAGNOSIS — Z803 Family history of malignant neoplasm of breast: Secondary | ICD-10-CM | POA: Diagnosis not present

## 2018-05-03 DIAGNOSIS — Z7902 Long term (current) use of antithrombotics/antiplatelets: Secondary | ICD-10-CM | POA: Diagnosis not present

## 2018-05-03 DIAGNOSIS — Z801 Family history of malignant neoplasm of trachea, bronchus and lung: Secondary | ICD-10-CM | POA: Insufficient documentation

## 2018-05-03 DIAGNOSIS — Z79899 Other long term (current) drug therapy: Secondary | ICD-10-CM | POA: Insufficient documentation

## 2018-05-03 DIAGNOSIS — G894 Chronic pain syndrome: Secondary | ICD-10-CM | POA: Diagnosis not present

## 2018-05-03 MED ORDER — HYDROCODONE-ACETAMINOPHEN 5-325 MG PO TABS: 1.0000 | ORAL_TABLET | Freq: Two times a day (BID) | ORAL | 0 refills | Status: DC | PRN

## 2018-05-03 NOTE — Progress Notes (Signed)
Patient's Name: Melody Ochoa  MRN: [id number]  Referring Provider: Burnard Hawthorne, FNP  DOB: 10-05-1941  PCP: Burnard Hawthorne, FNP  DOS: 05/03/2018  Note by: Gillis Santa, MD  Service setting: Ambulatory outpatient  Specialty: Interventional Pain Management  Location: [facility] (AMB) Pain Management Facility    Patient type: Established   Primary Reason(s) for Visit: Encounter for prescription drug management. (Level of risk: moderate)  CC: Back Pain (low and left) and Hip Pain (left)  HPI  Melody Ochoa is a 77 y.o. year old, female patient, who comes today for a medication management evaluation. She has Spondylolisthesis of lumbar region; UTI (lower urinary tract infection); Low back pain; Personal history of colonic polyps; Status post total replacement of right hip; Stroke (cerebrum) (HCC) - Patchy small volume acute ischemic nonhemorrhagic superior left cerebellar infarct, left SCA territory, of undetermined etiology, with diffuse atherosclerosis; Depression, recurrent ([place]); Pulmonary nodules; Atheromatous plaque; Hyperlipidemia; Rash; Lumbar radiculopathy; History of stroke; Greater trochanteric bursitis of both hips; Degeneration of lumbar or lumbosacral intervertebral disc; Major depression in remission ([employer]); Primary osteoarthritis of left hip; and Atherosclerosis of aorta (HCC) on their problem list. Her primarily concern today is the Back Pain (low and left) and Hip Pain (left)  Pain Assessment: Location: Lower, Left Back Radiating: left hip Onset: More than a month ago Duration: Chronic pain Quality: Discomfort, Burning, Aching, Sharp, Nagging Severity: 5 /10 (subjective, self-reported pain score)  Note: Reported level is compatible with observation.                         When using our objective Pain Scale, levels between 6 and 10/10 are said to belong in an emergency room, as it progressively worsens from a 6/10, described as severely limiting, requiring emergency care not  usually available at an outpatient pain management facility. At a 6/10 level, communication becomes difficult and requires great effort. Assistance to reach the emergency department may be required. Facial flushing and profuse sweating along with potentially dangerous increases in heart rate and blood pressure will be evident. Effect on ADL:   Timing: Constant Modifying factors: rest/medications BP: (!) 143/85  HR: 84  Melody Ochoa was last scheduled for an appointment on 04/12/2018 for medication management. During today's appointment we reviewed Melody Ochoa's chronic pain status, as well as her outpatient medication regimen.  Patient follows up today originally for a lumbar epidural steroid injection, left L2-L3.  She did not stop her Plavix 7 days prior to her scheduled procedure.  She stopped it approximately 3 days ago.  For this reason we will postpone her procedure 12 point where she can stop her Plavix 7 days prior to the scheduled procedure.  We will refill her hydrocodone today since she is out of this medication and is experiencing significant axial low back pain.  The patient  reports that she does not use drugs. Her body mass index is 31.24 kg/m.  Further details on both, my assessment(s), as well as the proposed treatment plan, please see below.  Controlled Substance Pharmacotherapy Assessment REMS (Risk Evaluation and Mitigation Strategy)  Analgesic: Hydrocodone 5 mg twice daily as needed, quantity 60-monthMME/day: 10 mg/day.  SHart Rochester RN  05/03/2018 10:42 AM  Sign at close encounter Safety precautions to be maintained throughout the outpatient stay will include: orient to surroundings, keep bed in low position, maintain call bell within reach at all times, provide assistance with transfer out of bed and ambulation.  Pharmacokinetics:  Liberation and absorption (onset of action): WNL Distribution (time to peak effect): WNL Metabolism and excretion (duration of  action): WNL         Pharmacodynamics: Desired effects: Analgesia: Melody Ochoa reports >50% benefit. Functional ability: Patient reports that medication allows her to accomplish basic ADLs Clinically meaningful improvement in function (CMIF): Sustained CMIF goals met Perceived effectiveness: Described as relatively effective, allowing for increase in activities of daily living (ADL) Undesirable effects: Side-effects or Adverse reactions: None reported Monitoring: [place] PMP: Online review of the past 79-monthperiod conducted. Compliant with practice rules and regulations Last UDS on record: Summary  Date Value Ref Range Status  02/15/2018 FINAL  Final    Comment:    ==================================================================== TOXASSURE COMP DRUG ANALYSIS,UR ==================================================================== Test                             Result       Flag       Units Drug Present and Declared for Prescription Verification   Gabapentin                     PRESENT      EXPECTED   Bupropion                      PRESENT      EXPECTED   Hydroxybupropion               PRESENT      EXPECTED    Hydroxybupropion is an expected metabolite of bupropion.   Duloxetine                     PRESENT      EXPECTED ==================================================================== Test                      Result    Flag   Units      Ref Range   Creatinine              90               mg/dL      >=20 ==================================================================== Declared Medications:  The flagging and interpretation on this report are based on the  following declared medications.  Unexpected results may arise from  inaccuracies in the declared medications.  **Note: The testing scope of this panel includes these medications:  Bupropion (Wellbutrin)  Duloxetine (Cymbalta)  Gabapentin  **Note: The testing scope of this panel does not include following  reported  medications:  Atorvastatin (Lipitor)  Calcium carbonate (Calcium carbonate/Vitamin D)  Clopidogrel (Plavix)  Cyanocobalamin  Iron (Ferrous Sulfate)  Mupirocin (Bactroban)  Triamcinolone (Kenalog)  Vitamin C  Vitamin D (Calcium carbonate/Vitamin D) ==================================================================== For clinical consultation, please call ([phone] ====================================================================    UDS interpretation: Compliant          Medication Assessment Form: Reviewed. Patient indicates being compliant with therapy Treatment compliance: Compliant Risk Assessment Profile: Aberrant behavior: See prior evaluations. None observed or detected today Comorbid factors increasing risk of overdose: See prior notes. No additional risks detected today Risk of substance use disorder (SUD): Low Opioid Risk Tool - 05/03/18 1042      Family History of Substance Abuse   Alcohol  Positive Female    Illegal Drugs  Negative    Rx Drugs  Negative      Personal History of Substance Abuse   Alcohol  Negative    Illegal Drugs  Negative    Rx Drugs  Negative      Age   Age between 99-45 years   No      History of Preadolescent Sexual Abuse   History of Preadolescent Sexual Abuse  Negative or Female      Psychological Disease   Psychological Disease  Negative    Depression  Positive      Total Score   Opioid Risk Tool Scoring  2    Opioid Risk Interpretation  Low Risk      ORT Scoring interpretation table:  Score <3 = Low Risk for SUD  Score between 4-7 = Moderate Risk for SUD  Score >8 = High Risk for Opioid Abuse   Risk Mitigation Strategies:  Patient Counseling: Covered Patient-Prescriber Agreement (PPA): Present and active  Notification to other healthcare providers: Done  Pharmacologic Plan: No change in therapy, at this time.             Laboratory Chemistry  Inflammation Markers (CRP: Acute Phase) (ESR: Chronic Phase) No  results found for: CRP, ESRSEDRATE, LATICACIDVEN                       Rheumatology Markers No results found for: RF, ANA, LABURIC, URICUR, LYMEIGGIGMAB, LYMEABIGMQN, HLAB27                      Renal Function Markers Lab Results  Component Value Date   BUN 12 04/17/2018   CREATININE 0.84 04/17/2018   GFRAA >60 04/17/2018   GFRNONAA >60 04/17/2018                              Hepatic Function Markers Lab Results  Component Value Date   AST 19 04/17/2018   ALT 17 04/17/2018   ALBUMIN 4.2 04/17/2018   ALKPHOS 64 04/17/2018                        Electrolytes Lab Results  Component Value Date   NA 135 04/17/2018   K 3.7 04/17/2018   CL 99 (L) 04/17/2018   CALCIUM 8.8 (L) 04/17/2018                        Neuropathy Markers Lab Results  Component Value Date   VITAMINB12 334 11/23/2014   FOLATE >20.0 11/23/2014   HGBA1C 5.3 08/10/2017                        Bone Pathology Markers No results found for: VD25OH, [id number], UV2536UY4, IH4742VZ5, 25OHVITD1, 25OHVITD2, 25OHVITD3, TESTOFREE, TESTOSTERONE                       Coagulation Parameters Lab Results  Component Value Date   INR 0.91 08/09/2017   LABPROT 12.2 08/09/2017   APTT 27 08/09/2017   PLT 299 04/17/2018                        Cardiovascular Markers Lab Results  Component Value Date   TROPONINI <0.03 08/19/2017   HGB 11.6 (L) 04/17/2018   HCT 34.3 (L) 04/17/2018                         CA Markers No results found for:  CEA, CA125, LABCA2                      Note: Lab results reviewed.  Recent Diagnostic Imaging Results  CUP PACEART REMOTE DEVICE CHECK Carelink summary report received. Battery status OK. Normal device function. No new symptom episodes, tachy episodes, brady, or pause episodes. No new AF episodes. Monthly summary reports and ROV with JA PRN.Levander Campion BSN, RN, CCDS  Complexity Note: Imaging results reviewed. Results shared with Melody Ochoa, using Layman's terms.                          Meds   Current Outpatient Medications:  .  acetaminophen (TYLENOL) 500 MG tablet, Take 2 tablets (1,000 mg total) by mouth every 8 (eight) hours as needed for mild pain or moderate pain., Disp: 20 tablet, Rfl: 2 .  atorvastatin (LIPITOR) 80 MG tablet, Take 1 tablet (80 mg total) daily at 6 PM by mouth., Disp: 90 tablet, Rfl: 3 .  baclofen (LIORESAL) 10 MG tablet, Take 1 tablet (10 mg total) by mouth 2 (two) times daily as needed for muscle spasms., Disp: 60 tablet, Rfl: 0 .  buPROPion (WELLBUTRIN XL) 300 MG 24 hr tablet, Take 300 mg by mouth daily. , Disp: , Rfl:  .  Calcium Carbonate-Vitamin D (CALTRATE 600+D PO), Take 1 tablet by mouth daily., Disp: , Rfl:  .  clopidogrel (PLAVIX) 75 MG tablet, Take 1 tablet (75 mg total) by mouth daily., Disp: 30 tablet, Rfl: 0 .  Cyanocobalamin (VITAMIN B-12 PO), Take 1 tablet by mouth daily., Disp: , Rfl:  .  DULoxetine (CYMBALTA) 60 MG capsule, Take 60 mg by mouth 2 (two) times daily. , Disp: , Rfl:  .  ferrous sulfate 325 (65 FE) MG tablet, Take 1 tablet (325 mg total) by mouth daily., Disp: 30 tablet, Rfl: 0 .  HYDROcodone-acetaminophen (NORCO/VICODIN) 5-325 MG tablet, Take 1 tablet by mouth 2 (two) times daily as needed for moderate pain. For chronic pain To last for 30 days from fill date, Disp: 60 tablet, Rfl: 0 .  mupirocin ointment (BACTROBAN) 2 %, Apply 1 application topically 2 (two) times daily., Disp: 22 g, Rfl: 1 .  triamcinolone cream (KENALOG) 0.1 %, Apply 1 application topically as needed., Disp: , Rfl: 0 .  vitamin C (ASCORBIC ACID) 500 MG tablet, Take 500 mg by mouth daily., Disp: , Rfl:   ROS  Constitutional: Denies any fever or chills Gastrointestinal: No reported hemesis, hematochezia, vomiting, or acute GI distress Musculoskeletal: Denies any acute onset joint swelling, redness, loss of ROM, or weakness Neurological: No reported episodes of acute onset apraxia, aphasia, dysarthria, agnosia, amnesia, paralysis, loss of  coordination, or loss of consciousness  Allergies  Melody Ochoa is allergic to [employer] sodium]; aspirin; celebrex [celecoxib]; effexor [venlafaxine]; hydrocodone-homatropine; ibuprofen; plavix [clopidogrel bisulfate]; tape; vioxx [rofecoxib]; other; and tramadol.  [place]  Drug: Melody Ochoa  reports that she does not use drugs. Alcohol:  reports that she drinks alcohol. Tobacco:  reports that she quit smoking about 21 years ago. Her smoking use included cigarettes. She has a 40.00 pack-year smoking history. She has never used smokeless tobacco. Medical:  has a past medical history of Arthritis, Atheromatous plaque (10/18/2017), Back pain, Cancer ([place]), Depression, Diverticulosis, Dizziness, GERD (gastroesophageal reflux disease), Heart murmur, Hyperlipidemia (10/18/2017), PONV (postoperative nausea and vomiting), and Stroke ([place]). Surgical: Melody Ochoa  has a past surgical history that includes Tonsillectomy; Facelift (89); Cervical disc  surgery (8); Back surgery (12); Colonoscopy with propofol (N/A, 01/13/2016); Total hip arthroplasty (Right, 04/22/2016); TEE without cardioversion (N/A, 08/12/2017); LOOP RECORDER INSERTION (N/A, 08/23/2017); Breast excisional biopsy (Left, 90's); and Breast biopsy (Bilateral). Family: family history includes Aneurysm in her brother; Breast cancer (age of onset: 44) in her maternal aunt; Cushing syndrome in her paternal grandmother; Dementia in her paternal grandfather; Diabetes in her maternal grandmother; Kidney disease in her maternal grandfather; Lung cancer in her father; Stroke in her brother.  Constitutional Exam  General appearance: Well nourished, well developed, and well hydrated. In no apparent acute distress Vitals:   05/03/18 1048  BP: (!) 143/85  Pulse: 84  Resp: 18  Temp: 97.7 F (36.5 C)  TempSrc: Oral  SpO2: 95%  Weight: 182 lb (82.6 kg)  Height: _0  (1.626 m)   BMI Assessment: Estimated body mass index is 31.24 kg/m as calculated  from the following:   Height as of this encounter: _1  (1.626 m).   Weight as of this encounter: 182 lb (82.6 kg).  BMI interpretation table: BMI level Category Range association with higher incidence of chronic pain  <18 kg/m2 Underweight   18.5-24.9 kg/m2 Ideal body weight   25-29.9 kg/m2 Overweight Increased incidence by 20%  30-34.9 kg/m2 Obese (Class I) Increased incidence by 68%  35-39.9 kg/m2 Severe obesity (Class II) Increased incidence by 136%  >40 kg/m2 Extreme obesity (Class III) Increased incidence by 254%   Patient's current BMI Ideal Body weight  Body mass index is 31.24 kg/m. Ideal body weight: 54.7 kg (120 lb 9.5 oz) Adjusted ideal body weight: 65.8 kg (145 lb 2.5 oz)   BMI Readings from Last 4 Encounters:  05/03/18 31.24 kg/m  04/17/18 31.24 kg/m  04/11/18 31.24 kg/m  04/06/18 34.30 kg/m   Wt Readings from Last 4 Encounters:  05/03/18 182 lb (82.6 kg)  04/17/18 182 lb (82.6 kg)  04/11/18 182 lb (82.6 kg)  04/06/18 199 lb 12.8 oz (90.6 kg)  Psych/Mental status: Alert, oriented x 3 (person, place, & time)       Eyes: PERLA Respiratory: No evidence of acute respiratory distress  Cervical Spine Area Exam  Skin & Axial Inspection: No masses, redness, edema, swelling, or associated skin lesions Alignment: Symmetrical Functional ROM: Unrestricted ROM      Stability: No instability detected Muscle Tone/Strength: Functionally intact. No obvious neuro-muscular anomalies detected. Sensory (Neurological): Unimpaired Palpation: No palpable anomalies              Upper Extremity (UE) Exam    Side: Right upper extremity  Side: Left upper extremity  Skin & Extremity Inspection: Skin color, temperature, and hair growth are WNL. No peripheral edema or cyanosis. No masses, redness, swelling, asymmetry, or associated skin lesions. No contractures.  Skin & Extremity Inspection: Skin color, temperature, and hair growth are WNL. No peripheral edema or cyanosis. No masses,  redness, swelling, asymmetry, or associated skin lesions. No contractures.  Functional ROM: Unrestricted ROM          Functional ROM: Unrestricted ROM          Muscle Tone/Strength: Functionally intact. No obvious neuro-muscular anomalies detected.  Muscle Tone/Strength: Functionally intact. No obvious neuro-muscular anomalies detected.  Sensory (Neurological): Unimpaired          Sensory (Neurological): Unimpaired          Palpation: No palpable anomalies              Palpation: No palpable anomalies  Provocative Test(s):  Phalen's test: deferred Tinel's test: deferred Apley's scratch test (touch opposite shoulder):  Action 1 (Across chest): deferred Action 2 (Overhead): deferred Action 3 (LB reach): deferred   Provocative Test(s):  Phalen's test: deferred Tinel's test: deferred Apley's scratch test (touch opposite shoulder):  Action 1 (Across chest): deferred Action 2 (Overhead): deferred Action 3 (LB reach): deferred    Thoracic Spine Area Exam  Skin & Axial Inspection: No masses, redness, or swelling Alignment: Symmetrical Functional ROM: Unrestricted ROM Stability: No instability detected Muscle Tone/Strength: Functionally intact. No obvious neuro-muscular anomalies detected. Sensory (Neurological): Unimpaired Muscle strength & Tone: No palpable anomalies  Lumbar Spine Area Exam  Skin & Axial Inspection: No masses, redness, or swelling Alignment: Symmetrical Functional ROM: Decreased ROM       Stability: No instability detected Muscle Tone/Strength: Functionally intact. No obvious neuro-muscular anomalies detected. Sensory (Neurological): Dermatomal pain pattern Palpation: No palpable anomalies       Provocative Tests: Lumbar Hyperextension/rotation test: deferred today       Lumbar quadrant test (Kemp's test): deferred today       Lumbar Lateral bending test: (+) ipsilateral radicular pain, on the left. Positive for left-sided foraminal stenosis. Patrick's  Maneuver: deferred today                   FABER test: deferred today       Thigh-thrust test: deferred today       S-I compression test: deferred today       S-I distraction test: deferred today         Gait & Posture Assessment  Ambulation: Patient ambulates using a cane Gait: Antalgic Posture: Difficulty standing up straight, due to pain   Lower Extremity Exam    Side: Right lower extremity  Side: Left lower extremity  Stability: No instability observed          Stability: No instability observed          Skin & Extremity Inspection: Skin color, temperature, and hair growth are WNL. No peripheral edema or cyanosis. No masses, redness, swelling, asymmetry, or associated skin lesions. No contractures.  Skin & Extremity Inspection: Skin color, temperature, and hair growth are WNL. No peripheral edema or cyanosis. No masses, redness, swelling, asymmetry, or associated skin lesions. No contractures.  Functional ROM: Unrestricted ROM                  Functional ROM: Unrestricted ROM                  Muscle Tone/Strength: Functionally intact. No obvious neuro-muscular anomalies detected.  Muscle Tone/Strength: Functionally intact. No obvious neuro-muscular anomalies detected.  Sensory (Neurological): Unimpaired  Sensory (Neurological): Unimpaired  Palpation: No palpable anomalies  Palpation: No palpable anomalies   Assessment  Primary Diagnosis & Pertinent Problem List: The primary encounter diagnosis was Lumbar radiculopathy. Diagnoses of Degeneration of lumbar or lumbosacral intervertebral disc, History of lumbar surgery, Chronic pain syndrome, and Spondylolisthesis of lumbar region were also pertinent to this visit.  Status Diagnosis  Persistent Persistent Persistent 1. Lumbar radiculopathy   2. Degeneration of lumbar or lumbosacral intervertebral disc   3. History of lumbar surgery   4. Chronic pain syndrome   5. Spondylolisthesis of lumbar region      General Recommendations:  The pain condition that the patient suffers from is best treated with a multidisciplinary approach that involves an increase in physical activity to prevent de-conditioning and worsening of the pain cycle, as well as  psychological counseling (formal and/or informal) to address the co-morbid psychological affects of pain. Treatment will often involve judicious use of pain medications and interventional procedures to decrease the pain, allowing the patient to participate in the physical activity that will ultimately produce long-lasting pain reductions. The goal of the multidisciplinary approach is to return the patient to a higher level of overall function and to restore their ability to perform activities of daily living.  77 year old female with a history of posterior L3-L5 spinal fusion and decompression on 12/03/2016 who presents with axial low back pain that is worse on the left that radiates into her left groin and buttock. She denies any pain in her lower extremities. She has minimal right-sided pain. She is status post right hip replacement surgery. Of note patient does have a history of a right TIA for which she is on Plavix 75 mg. She states that she has some minor speech deficit and slight left facial droop as a result of the TIA. Patient finds it difficult to ambulate for an extended period of time.  Patient is status post L2-L3, left, epidural steroid injection on 03/13/2017 which provided her with approximately 50 to 60% pain relief for the first 10 days.  Patient is now endorsing return of her left leg pain that was similar in quality to it before her lumbar epidural steroid injection.  Patient has had subsequent falls since her last encounter with me.  She was originally scheduled to have a left L2/3 epidural steroid injection #2 today however the patient did not stop her Plavix 7 days prior to her scheduled procedure today.  Of note patient's last dose of Plavix was on Saturday.  Patient also  has a rash that is healing in her lower lumbar region along her inferior incision site.  We will postpone her lumbar epidural steroid injection so that she can adequately stop her Plavix 7 days prior to scheduled procedure and hopefully her lumbar rash will also improve by that time.  Patient endorsed understanding.  We will refill patient's hydrocodone today as below.  UDS up-to-date and appropriate.  [place] PMP checked and appropriate.  Plan: -Reschedule lumbar epidural steroid injection, left L2/3 (#2) once patient has stopped Plavix 7 days prior to her procedure and lumbar rash has improved -Prescription for hydrocodone as below.   Plan of Care  Pharmacotherapy (Medications Ordered): Meds ordered this encounter  Medications  . HYDROcodone-acetaminophen (NORCO/VICODIN) 5-325 MG tablet    Sig: Take 1 tablet by mouth 2 (two) times daily as needed for moderate pain. For chronic pain To last for 30 days from fill date    Dispense:  60 tablet    Refill:  0    Do not place this medication, or any other prescription from our practice, on "Automatic Refill". Patient may have prescription filled one day early if pharmacy is closed on scheduled refill date. Do not fill until:  To last until:   Time Note: Greater than 50% of the 25 minute(s) of face-to-face time spent with Melody Ochoa, was spent in counseling/coordination of care regarding: Melody Ochoa primary cause of pain, the treatment plan, treatment alternatives, the risks and possible complications of proposed treatment, going over the informed consent, the opioid analgesic risks and possible complications, the appropriate use of her medications, realistic expectations, the medication agreement and the patient's responsibilities when it comes to controlled substances.  Provider-requested follow-up: Return in about 12 days (around 05/15/2018).  Future Appointments  Date Time Provider [place]  05/15/2018  10:15 AM Gillis Santa, MD  [facility] None  05/16/2018 10:00 AM [facility]  05/17/2018  2:20 PM [facility] DEX[facility] 1 [facility]  06/01/2018  1:15 PM [facility] DEVICE REMOTES [facility]  06/30/2018  1:30 PM Burnard Hawthorne, FNP [facility]  02/16/2019  2:00 PM O'Brien-Blaney, Bryson Corona, LPN [facility]    Primary Care Physician: Burnard Hawthorne, FNP Location: [facility] Note by: Gillis Santa, M.D Date: 05/03/2018; Time: 2:40 PM  Patient Instructions  1. Left L2-3 ESI with sedation on 05/15/18, at 10:15 for left L2/3 ESI 2. Script for Hydrocodone for 1 month  Epidural Steroid Injection Patient Information  Description: The epidural space surrounds the nerves as they exit the spinal cord.  In some patients, the nerves can be compressed and inflamed by a bulging disc or a tight spinal canal (spinal stenosis).  By injecting steroids into the epidural space, we can bring irritated nerves into direct contact with a potentially helpful medication.  These steroids act directly on the irritated nerves and can reduce swelling and inflammation which often leads to decreased pain.  Epidural steroids may be injected anywhere along the spine and from the neck to the low back depending upon the location of your pain.   After numbing the skin with local anesthetic (like Novocaine), a small needle is passed into the epidural space slowly.  You may experience a sensation of pressure while this is being done.  The entire block usually last less than 10 minutes.  Conditions which may be treated by epidural steroids:   Low back and leg pain  Neck and arm pain  Spinal stenosis  Post-laminectomy syndrome  Herpes zoster (shingles) pain  Pain from compression fractures  Preparation for the injection:  1. Do not eat any solid food or dairy products within 8 hours of your appointment.  2. You may drink clear liquids up to 3 hours before appointment.  Clear liquids include  water, black coffee, juice or soda.  No milk or cream please. 3. You may take your regular medication, including pain medications, with a sip of water before your appointment  Diabetics should hold regular insulin (if taken separately) and take 1/2 normal NPH dos the morning of the procedure.  Carry some sugar containing items with you to your appointment. 4. A driver must accompany you and be prepared to drive you home after your procedure.  5. Bring all your current medications with your. 6. An IV may be inserted and sedation may be given at the discretion of the physician.   7. A blood pressure cuff, EKG and other monitors will often be applied during the procedure.  Some patients may need to have extra oxygen administered for a short period. 8. You will be asked to provide medical information, including your allergies, prior to the procedure.  We must know immediately if you are taking blood thinners (like Coumadin/Warfarin)  Or if you are allergic to IV iodine contrast (dye). We must know if you could possible be pregnant.  Possible side-effects:  Bleeding from needle site  Infection (rare, may require surgery)  Nerve injury (rare)  Numbness & tingling (temporary)  Difficulty urinating (rare, temporary)  Spinal headache ( a headache worse with upright posture)  Light -headedness (temporary)  Pain at injection site (several days)  Decreased blood pressure (temporary)  Weakness in arm/leg (temporary)  Pressure sensation in back/neck (temporary)  Call if you experience:  Fever/chills associated with headache or increased  back/neck pain.  Headache worsened by an upright position.  New onset weakness or numbness of an extremity below the injection site  Hives or difficulty breathing (go to the emergency room)  Inflammation or drainage at the infection site  Severe back/neck pain  Any new symptoms which are concerning to you  Please note:  Although the local  anesthetic injected can often make your back or neck feel good for several hours after the injection, the pain will likely return.  It takes 3-7 days for steroids to work in the epidural space.  You may not notice any pain relief for at least that one week.  If effective, we will often do a series of three injections spaced 3-6 weeks apart to maximally decrease your pain.  After the initial series, we generally will wait several months before considering a repeat injection of the same type.  If you have any questions, please call [phone] [facility]  If you choose not to have sedation, do not eat or drink 3 hour before your appointment.

## 2018-05-03 NOTE — Patient Instructions (Addendum)
1. Left L2-3 ESI with sedation on 05/15/18, at 10:15 for left L2/3 ESI 2. Script for Hydrocodone for 1 month  Epidural Steroid Injection Patient Information  Description: The epidural space surrounds the nerves as they exit the spinal cord.  In some patients, the nerves can be compressed and inflamed by a bulging disc or a tight spinal canal (spinal stenosis).  By injecting steroids into the epidural space, we can bring irritated nerves into direct contact with a potentially helpful medication.  These steroids act directly on the irritated nerves and can reduce swelling and inflammation which often leads to decreased pain.  Epidural steroids may be injected anywhere along the spine and from the neck to the low back depending upon the location of your pain.   After numbing the skin with local anesthetic (like Novocaine), a small needle is passed into the epidural space slowly.  You may experience a sensation of pressure while this is being done.  The entire block usually last less than 10 minutes.  Conditions which may be treated by epidural steroids:   Low back and leg pain  Neck and arm pain  Spinal stenosis  Post-laminectomy syndrome  Herpes zoster (shingles) pain  Pain from compression fractures  Preparation for the injection:  1. Do not eat any solid food or dairy products within 8 hours of your appointment.  2. You may drink clear liquids up to 3 hours before appointment.  Clear liquids include water, black coffee, juice or soda.  No milk or cream please. 3. You may take your regular medication, including pain medications, with a sip of water before your appointment  Diabetics should hold regular insulin (if taken separately) and take 1/2 normal NPH dos the morning of the procedure.  Carry some sugar containing items with you to your appointment. 4. A driver must accompany you and be prepared to drive you home after your procedure.  5. Bring all your current medications with  your. 6. An IV may be inserted and sedation may be given at the discretion of the physician.   7. A blood pressure cuff, EKG and other monitors will often be applied during the procedure.  Some patients may need to have extra oxygen administered for a short period. 8. You will be asked to provide medical information, including your allergies, prior to the procedure.  We must know immediately if you are taking blood thinners (like Coumadin/Warfarin)  Or if you are allergic to IV iodine contrast (dye). We must know if you could possible be pregnant.  Possible side-effects:  Bleeding from needle site  Infection (rare, may require surgery)  Nerve injury (rare)  Numbness & tingling (temporary)  Difficulty urinating (rare, temporary)  Spinal headache ( a headache worse with upright posture)  Light -headedness (temporary)  Pain at injection site (several days)  Decreased blood pressure (temporary)  Weakness in arm/leg (temporary)  Pressure sensation in back/neck (temporary)  Call if you experience:  Fever/chills associated with headache or increased back/neck pain.  Headache worsened by an upright position.  New onset weakness or numbness of an extremity below the injection site  Hives or difficulty breathing (go to the emergency room)  Inflammation or drainage at the infection site  Severe back/neck pain  Any new symptoms which are concerning to you  Please note:  Although the local anesthetic injected can often make your back or neck feel good for several hours after the injection, the pain will likely return.  It takes 3-7 days for  steroids to work in the epidural space.  You may not notice any pain relief for at least that one week.  If effective, we will often do a series of three injections spaced 3-6 weeks apart to maximally decrease your pain.  After the initial series, we generally will wait several months before considering a repeat injection of the same  type.  If you have any questions, please call [phone] [facility]  If you choose not to have sedation, do not eat or drink 3 hour before your appointment.

## 2018-05-03 NOTE — Progress Notes (Signed)
Safety precautions to be maintained throughout the outpatient stay will include: orient to surroundings, keep bed in low position, maintain call bell within reach at all times, provide assistance with transfer out of bed and ambulation.  

## 2018-05-03 NOTE — Telephone Encounter (Signed)
Asking if she can take Tylenol with Hydrocodone. Informed her that Hydrocodone has Tylenol in it, do not take any Tylenol with it.

## 2018-05-04 ENCOUNTER — Telehealth: Payer: Self-pay | Admitting: *Deleted

## 2018-05-04 NOTE — Telephone Encounter (Signed)
Called for post procedure follow-up, but patient did not have procedure.

## 2018-05-10 ENCOUNTER — Ambulatory Visit: Payer: Medicare Other

## 2018-05-15 ENCOUNTER — Ambulatory Visit (HOSPITAL_BASED_OUTPATIENT_CLINIC_OR_DEPARTMENT_OTHER): Payer: Medicare Other | Admitting: Student in an Organized Health Care Education/Training Program

## 2018-05-15 ENCOUNTER — Encounter: Payer: Self-pay | Admitting: Student in an Organized Health Care Education/Training Program

## 2018-05-15 ENCOUNTER — Ambulatory Visit
Admission: RE | Admit: 2018-05-15 | Discharge: 2018-05-15 | Disposition: A | Discharge status: Home / Self Care | Payer: Medicare Other | Source: Ambulatory Visit | Attending: Student in an Organized Health Care Education/Training Program | Admitting: Student in an Organized Health Care Education/Training Program

## 2018-05-15 VITALS — BP 152/84 | HR 82 | Temp 98.4°F | Resp 14 | Ht 64.0 in | Wt 199.0 lb

## 2018-05-15 DIAGNOSIS — Z96641 Presence of right artificial hip joint: Secondary | ICD-10-CM | POA: Diagnosis not present

## 2018-05-15 DIAGNOSIS — Z7902 Long term (current) use of antithrombotics/antiplatelets: Secondary | ICD-10-CM | POA: Diagnosis not present

## 2018-05-15 DIAGNOSIS — G8929 Other chronic pain: Secondary | ICD-10-CM | POA: Diagnosis not present

## 2018-05-15 DIAGNOSIS — M5416 Radiculopathy, lumbar region: Secondary | ICD-10-CM | POA: Insufficient documentation

## 2018-05-15 DIAGNOSIS — Z886 Allergy status to analgesic agent status: Secondary | ICD-10-CM | POA: Insufficient documentation

## 2018-05-15 DIAGNOSIS — Z888 Allergy status to other drugs, medicaments and biological substances status: Secondary | ICD-10-CM | POA: Diagnosis not present

## 2018-05-15 DIAGNOSIS — Z981 Arthrodesis status: Secondary | ICD-10-CM | POA: Insufficient documentation

## 2018-05-15 DIAGNOSIS — Z885 Allergy status to narcotic agent status: Secondary | ICD-10-CM | POA: Insufficient documentation

## 2018-05-15 MED ORDER — DEXAMETHASONE SODIUM PHOSPHATE 10 MG/ML IJ SOLN
INTRAMUSCULAR | Status: AC
  Filled 2018-05-15: qty 1

## 2018-05-15 MED ORDER — IOPAMIDOL (ISOVUE-M 200) INJECTION 41%
10.0000 mL | Freq: Once | INTRAMUSCULAR | Status: AC
  Administered 2018-05-15: 10 mL via EPIDURAL
  Filled 2018-05-15: qty 10

## 2018-05-15 MED ORDER — LIDOCAINE HCL (PF) 1 % IJ SOLN
4.5000 mL | Freq: Once | INTRAMUSCULAR | Status: AC
  Administered 2018-05-15: 4.5 mL

## 2018-05-15 MED ORDER — DEXAMETHASONE SODIUM PHOSPHATE 10 MG/ML IJ SOLN
10.0000 mg | Freq: Once | INTRAMUSCULAR | Status: AC
  Administered 2018-05-15: 10 mg

## 2018-05-15 MED ORDER — SODIUM CHLORIDE 0.9% FLUSH
1.0000 mL | Freq: Once | INTRAVENOUS | Status: AC
  Administered 2018-05-15: 1 mL

## 2018-05-15 MED ORDER — ROPIVACAINE HCL 2 MG/ML IJ SOLN
1.0000 mL | Freq: Once | INTRAMUSCULAR | Status: AC
  Administered 2018-05-15: 1 mL via EPIDURAL

## 2018-05-15 MED ORDER — LIDOCAINE HCL (PF) 1 % IJ SOLN
INTRAMUSCULAR | Status: AC
  Filled 2018-05-15: qty 5

## 2018-05-15 MED ORDER — ROPIVACAINE HCL 2 MG/ML IJ SOLN
INTRAMUSCULAR | Status: AC
  Filled 2018-05-15: qty 10

## 2018-05-15 MED ORDER — SODIUM CHLORIDE 0.9 % IJ SOLN
INTRAMUSCULAR | Status: AC
  Filled 2018-05-15: qty 10

## 2018-05-15 NOTE — Progress Notes (Signed)
Safety precautions to be maintained throughout the outpatient stay will include: orient to surroundings, keep bed in low position, maintain call bell within reach at all times, provide assistance with transfer out of bed and ambulation.  

## 2018-05-15 NOTE — Progress Notes (Signed)
Patient's Name: Melody Ochoa  MRN: [id number]  Referring Provider: Burnard Hawthorne, FNP  DOB: 03-11-1941  PCP: Burnard Hawthorne, FNP  DOS: 05/15/2018  Note by: Gillis Santa, MD  Service setting: Ambulatory outpatient  Specialty: Interventional Pain Management  Patient type: Established  Location: [facility] (AMB) Pain Management Facility  Visit type: Interventional Procedure   Primary Reason for Visit: Interventional Pain Management Treatment. CC: Back Pain (lower left )  Procedure:       Anesthesia, Analgesia, Anxiolysis:  Type: Therapeutic Inter-Laminar Epidural Steroid Injection #2  Region: Lumbar Level: L2-3 Level. Laterality: Left-Sided         Type: Local Anesthesia Indication(s): Analgesia         Route: Infiltration (Ehrenfeld/IM) IV Access: Declined Sedation: Declined  Local Anesthetic: Lidocaine 1%   Indications: 1. Lumbar radiculopathy    Pain Score: Pre-procedure: 9 (when walking)/10 Post-procedure: 8 (When walking)/10  Pre-op Assessment:  Melody Ochoa is a 77 y.o. (year old), female patient, seen today for interventional treatment. She  has a past surgical history that includes Tonsillectomy; Facelift (94); Cervical disc surgery (09); Back surgery (13); Colonoscopy with propofol (N/A, 01/13/2016); Total hip arthroplasty (Right, 04/22/2016); TEE without cardioversion (N/A, 08/12/2017); LOOP RECORDER INSERTION (N/A, 08/23/2017); Breast excisional biopsy (Left, 90's); and Breast biopsy (Bilateral). [name] has a current medication list which includes the following prescription(s): atorvastatin, baclofen, bupropion, calcium carbonate-vitamin d, clopidogrel, cyanocobalamin, duloxetine, ferrous sulfate, hydrocodone-acetaminophen, vitamin c, acetaminophen, mupirocin ointment, and triamcinolone cream. Her primarily concern today is the Back Pain (lower left )  77 year old female with a history of posterior L3-L5 spinal fusion and decompression on 12/03/2016 who presents with axial low  back pain that is worse on the left that radiates into her left groin and buttock secondary to chronic lumbar radiculopathy here for left L2/3 epidural steroid injection, therapeutic, #2.  Patient's last dose of Plavix was on May 06, 2018.  We will proceed with left L2/3 ESI #2.    Initial Vital Signs:  Pulse Rate: 93 Temp: 98.4 F (36.9 C) Resp: 16 BP: 130/80 SpO2: 100 %(patient has gel polish on and O2 sats aren't registering)  BMI: Estimated body mass index is 34.16 kg/m as calculated from the following:   Height as of this encounter: 5\' 4"  (1.626 m).   Weight as of this encounter: 199 lb (90.3 kg).  Risk Assessment: Allergies: Reviewed. She is allergic to aleve [naproxen sodium]; aspirin; celebrex [celecoxib]; effexor [venlafaxine]; hydrocodone-homatropine; ibuprofen; plavix [clopidogrel bisulfate]; tape; vioxx [rofecoxib]; other; and tramadol.  Allergy Precautions: None required Coagulopathies: Reviewed. None identified.  Blood-thinner therapy: None at this time Active Infection(s): Reviewed. None identified. Melody Ochoa is afebrile  Site Confirmation: Melody Ochoa was asked to confirm the procedure and laterality before marking the site Procedure checklist: Completed Consent: Before the procedure and under the influence of no sedative(s), amnesic(s), or anxiolytics, the patient was informed of the treatment options, risks and possible complications. To fulfill our ethical and legal obligations, as recommended by the American Medical Association's Code of Ethics, I have informed the patient of my clinical impression; the nature and purpose of the treatment or procedure; the risks, benefits, and possible complications of the intervention; the alternatives, including doing nothing; the risk(s) and benefit(s) of the alternative treatment(s) or procedure(s); and the risk(s) and benefit(s) of doing nothing. The patient was provided information about the general risks and possible  complications associated with the procedure. These may include, but are not limited to: failure to achieve desired goals, infection,  bleeding, organ or nerve damage, allergic reactions, paralysis, and death. In addition, the patient was informed of those risks and complications associated to Spine-related procedures, such as failure to decrease pain; infection (i.e.: Meningitis, epidural or intraspinal abscess); bleeding (i.e.: epidural hematoma, subarachnoid hemorrhage, or any other type of intraspinal or peri-dural bleeding); organ or nerve damage (i.e.: Any type of peripheral nerve, nerve root, or spinal cord injury) with subsequent damage to sensory, motor, and/or autonomic systems, resulting in permanent pain, numbness, and/or weakness of one or several areas of the body; allergic reactions; (i.e.: anaphylactic reaction); and/or death. Furthermore, the patient was informed of those risks and complications associated with the medications. These include, but are not limited to: allergic reactions (i.e.: anaphylactic or anaphylactoid reaction(s)); adrenal axis suppression; blood sugar elevation that in diabetics may result in ketoacidosis or comma; water retention that in patients with history of congestive heart failure may result in shortness of breath, pulmonary edema, and decompensation with resultant heart failure; weight gain; swelling or edema; medication-induced neural toxicity; particulate matter embolism and blood vessel occlusion with resultant organ, and/or nervous system infarction; and/or aseptic necrosis of one or more joints. Finally, the patient was informed that Medicine is not an exact science; therefore, there is also the possibility of unforeseen or unpredictable risks and/or possible complications that may result in a catastrophic outcome. The patient indicated having understood very clearly. We have given the patient no guarantees and we have made no promises. Enough time was given to the  patient to ask questions, all of which were answered to the patient's satisfaction. Melody Ochoa has indicated that she wanted to continue with the procedure. Attestation: I, the ordering provider, attest that I have discussed with the patient the benefits, risks, side-effects, alternatives, likelihood of achieving goals, and potential problems during recovery for the procedure that I have provided informed consent. Date  Time: 05/15/2018 10:24 AM  Pre-Procedure Preparation:  Monitoring: As per clinic protocol. Respiration, ETCO2, SpO2, BP, heart rate and rhythm monitor placed and checked for adequate function Safety Precautions: Patient was assessed for positional comfort and pressure points before starting the procedure. Time-out: I initiated and conducted the "Time-out" before starting the procedure, as per protocol. The patient was asked to participate by confirming the accuracy of the "Time Out" information. Verification of the correct person, site, and procedure were performed and confirmed by me, the nursing staff, and the patient. "Time-out" conducted as per Joint Commission's Universal Protocol (UP.01.01.01). Time: 1110  Description of Procedure:       Position: Prone with head of the table was raised to facilitate breathing. Target Area: The interlaminar space, initially targeting the lower laminar border of the superior vertebral body. Approach: Paramedial approach. Area Prepped: Entire Posterior Lumbar Region Prepping solution: ChloraPrep (2% chlorhexidine gluconate and 70% isopropyl alcohol) Safety Precautions: Aspiration looking for blood return was conducted prior to all injections. At no point did we inject any substances, as a needle was being advanced. No attempts were made at seeking any paresthesias. Safe injection practices and needle disposal techniques used. Medications properly checked for expiration dates. SDV (single dose vial) medications used. Description of the  Procedure: Protocol guidelines were followed. The procedure needle was introduced through the skin, ipsilateral to the reported pain, and advanced to the target area. Bone was contacted and the needle walked caudad, until the lamina was cleared. The epidural space was identified using "loss-of-resistance technique" with 2-3 ml of PF-NaCl (0.9% NSS), in a 5cc LOR glass syringe. Vitals:  05/15/18 1100 05/15/18 1110 05/15/18 1115 05/15/18 1120  BP: 131/82 (!) 145/87 (!) 156/87 (!) 152/84  Pulse: 93 91 88 82  Resp: 15 15 13 14   Temp:      TempSrc:      SpO2: 98% 97% 99% 98%  Weight:      Height:        Start Time: 1110 hrs. End Time: 1116 hrs. Materials:  Needle(s) Type: Epidural needle Gauge: 17G Length: 3.5-in Medication(s): Please see orders for medications and dosing details. 8 CC solution made of 5 cc of preservative-free saline, 2 cc of 0.2% ropivacaine, 1 cc of Decadron 10 mg/cc Imaging Guidance (Spinal):  Type of Imaging Technique: Fluoroscopy Guidance (Spinal) Indication(s): Assistance in needle guidance and placement for procedures requiring needle placement in or near specific anatomical locations not easily accessible without such assistance. Exposure Time: Please see nurses notes. Contrast: Before injecting any contrast, we confirmed that the patient did not have an allergy to iodine, shellfish, or radiological contrast. Once satisfactory needle placement was completed at the desired level, radiological contrast was injected. Contrast injected under live fluoroscopy. No contrast complications. See chart for type and volume of contrast used. Fluoroscopic Guidance: I was personally present during the use of fluoroscopy. "Tunnel Vision Technique" used to obtain the best possible view of the target area. Parallax error corrected before commencing the procedure. "Direction-depth-direction" technique used to introduce the needle under continuous pulsed fluoroscopy. Once target was  reached, antero-posterior, oblique, and lateral fluoroscopic projection used confirm needle placement in all planes. Images permanently stored in EMR. Interpretation: I personally interpreted the imaging intraoperatively. Adequate needle placement confirmed in multiple planes. Appropriate spread of contrast into desired area was observed. No evidence of afferent or efferent intravascular uptake. No intrathecal or subarachnoid spread observed. Permanent images saved into the patient's record.  Antibiotic Prophylaxis:   Anti-infectives (From admission, onward)   None     Indication(s): None identified  Post-operative Assessment:  Post-procedure Vital Signs:  Pulse Rate: 82 Temp: 98.4 F (36.9 C) Resp: 14 BP: (!) 152/84 SpO2: 98 %  EBL: None  Complications: No immediate post-treatment complications observed by team, or reported by patient.  Note: The patient tolerated the entire procedure well. A repeat set of vitals were taken after the procedure and the patient was kept under observation following institutional policy, for this type of procedure. Post-procedural neurological assessment was performed, showing return to baseline, prior to discharge. The patient was provided with post-procedure discharge instructions, including a section on how to identify potential problems. Should any problems arise concerning this procedure, the patient was given instructions to immediately contact us, at any time, without hesitation. In any case, we plan to contact the patient by telephone for a follow-up status report regarding this interventional procedure.  Comments:  No additional relevant information.   Plan of Care    Imaging Orders     DG C-Arm 1-60 Min-No Report Procedure Orders    No procedure(s) ordered today   Patient instructed to resume Plavix tomorrow after nursing staff calls for postprocedural evaluation.  As long as patient is not having any new lower ext. weakness, patient  instructed to restart Plavix 24 hours after procedure.  Medications ordered for procedure: Meds ordered this encounter  Medications  . iopamidol (ISOVUE-M) 41 % intrathecal injection 10 mL  . dexamethasone (DECADRON) injection 10 mg  . ropivacaine (PF) 2 mg/mL (0.2%) (NAROPIN) injection 1 mL  . sodium chloride flush (NS) 0.9 % injection 1 mL  .  lidocaine (PF) (XYLOCAINE) 1 % injection 4.5 mL   Medications administered: We administered iopamidol, dexamethasone, ropivacaine (PF) 2 mg/mL (0.2%), sodium chloride flush, and lidocaine (PF).  See the medical record for exact dosing, route, and time of administration.  New Prescriptions   No medications on file   Disposition: Discharge home  Discharge Date & Time: 05/15/2018; 1124 hrs.   Physician-requested Follow-up: Return in about 1 month (around 06/12/2018) for Post Procedure Evaluation.  Future Appointments  Date Time Provider [place]  05/16/2018 10:00 AM [facility]  05/17/2018  2:20 PM [facility] DEX[facility] 1 [facility]  05/18/2018 11:00 AM Laverle Hobby, MD [facility] None  06/01/2018  1:15 PM [facility] DEVICE REMOTES [facility]  06/14/2018  1:15 PM Gillis Santa, MD [facility] None  06/30/2018  1:30 PM Burnard Hawthorne, FNP [facility]  02/16/2019  2:00 PM O'Brien-Blaney, Bryson Corona, LPN [facility]   Primary Care Physician: Burnard Hawthorne, FNP Location: [facility] Note by: Gillis Santa, MD Date: 05/15/2018; Time: 12:02 PM  Disclaimer:  Medicine is not an exact science. The only guarantee in medicine is that nothing is guaranteed. It is important to note that the decision to proceed with this intervention was based on the information collected from the patient. The Data and conclusions were drawn from the patient's questionnaire, the interview, and the physical examination. Because the information was provided in large part by the patient, it cannot be guaranteed  that it has not been purposely or unconsciously manipulated. Every effort has been made to obtain as much relevant data as possible for this evaluation. It is important to note that the conclusions that lead to this procedure are derived in large part from the available data. Always take into account that the treatment will also be dependent on availability of resources and existing treatment guidelines, considered by other Pain Management Practitioners as being common knowledge and practice, at the time of the intervention. For Medico-Legal purposes, it is also important to point out that variation in procedural techniques and pharmacological choices are the acceptable norm. The indications, contraindications, technique, and results of the above procedure should only be interpreted and judged by a Board-Certified Interventional Pain Specialist with extensive familiarity and expertise in the same exact procedure and technique.

## 2018-05-15 NOTE — Patient Instructions (Signed)
Epidural Steroid Injection An epidural steroid injection is a shot of steroid medicine and numbing medicine that is given into the space between the spinal cord and the bones in your back (epidural space). The shot helps relieve pain caused by an irritated or swollen nerve root. The amount of pain relief you get from the injection depends on what is causing the nerve to be swollen and irritated, and how long your pain lasts. You are more likely to benefit from this injection if your pain is strong and comes on suddenly rather than if you have had pain for a long time. Tell a health care provider about:  Any allergies you have.  All medicines you are taking, including vitamins, herbs, eye drops, creams, and over-the-counter medicines.  Any problems you or family members have had with anesthetic medicines.  Any blood disorders you have.  Any surgeries you have had.  Any medical conditions you have.  Whether you are pregnant or may be pregnant. What are the risks? Generally, this is a safe procedure. However, problems may occur, including:  Headache.  Bleeding.  Infection.  Allergic reaction to medicines.  Damage to your nerves. What happens before the procedure? Staying hydrated  Follow instructions from your health care provider about hydration, which may include:  Up to 2 hours before the procedure - you may continue to drink clear liquids, such as water, clear fruit juice, black coffee, and plain tea. Eating and drinking restrictions  Follow instructions from your health care provider about eating and drinking, which may include:  8 hours before the procedure - stop eating heavy meals or foods such as meat, fried foods, or fatty foods.  6 hours before the procedure - stop eating light meals or foods, such as toast or cereal.  6 hours before the procedure - stop drinking milk or drinks that contain milk.  2 hours before the procedure - stop drinking clear  liquids. Medicine  You may be given medicines to lower anxiety.  Ask your health care provider about:  Changing or stopping your regular medicines. This is especially important if you are taking diabetes medicines or blood thinners.  Taking medicines such as aspirin and ibuprofen. These medicines can thin your blood. Do not take these medicines before your procedure if your health care provider instructs you not to. General instructions  Plan to have someone take you home from the hospital or clinic. What happens during the procedure?  You may receive a medicine to help you relax (sedative).  You will be asked to lie on your abdomen.  The injection site will be cleaned.  A numbing medicine (local anesthetic) will be used to numb the injection site.  A needle will be inserted through your skin into the epidural space. You may feel some discomfort when this happens. An X-ray machine will be used to make sure the needle is put as close as possible to the affected nerve.  A steroid medicine and a local anesthetic will be injected into the epidural space.  The needle will be removed.  A bandage (dressing) will be put over the injection site. What happens after the procedure?  Your blood pressure, heart rate, breathing rate, and blood oxygen level will be monitored until the medicines you were given have worn off.  Your arm or leg may feel weak or numb for a few hours.  The injection site may feel sore.  Do not drive for 24 hours if you received a sedative. This information   information is not intended to replace advice given to you by your health care provider. Make sure you discuss any questions you have with your health care provider. Document Released: 02/29/2008 Document Revised: 05/05/2016 Document Reviewed: 03/09/2016 Elsevier Interactive Patient Education  2018 [place]. Pain Management Discharge Instructions  General Discharge Instructions :  If you need to reach your  doctor call: Monday-Friday 8:00 am - 4:00 pm at [phone] or toll free [phone].  After clinic hours [phone] to have operator reach doctor.  Bring all of your medication bottles to all your appointments in the pain clinic.  To cancel or reschedule your appointment with Pain Management please remember to call 24 hours in advance to avoid a fee.  Refer to the educational materials which you have been given on: General Risks, I had my Procedure. Discharge Instructions, Post Sedation.  Post Procedure Instructions:  The drugs you were given will stay in your system until tomorrow, so for the next 24 hours you should not drive, make any legal decisions or drink any alcoholic beverages.  You may eat anything you prefer, but it is better to start with liquids then soups and crackers, and gradually work up to solid foods.  Please notify your doctor immediately if you have any unusual bleeding, trouble breathing or pain that is not related to your normal pain.  Depending on the type of procedure that was done, some parts of your body may feel week and/or numb.  This usually clears up by tonight or the next day.  Walk with the use of an assistive device or accompanied by an adult for the 24 hours.  You may use ice on the affected area for the first 24 hours.  Put ice in a Ziploc bag and cover with a towel and place against area 15 minutes on 15 minutes off.  You may switch to heat after 24 hours.

## 2018-05-16 ENCOUNTER — Ambulatory Visit
Admission: RE | Admit: 2018-05-16 | Discharge: 2018-05-16 | Disposition: A | Discharge status: Home / Self Care | Payer: Medicare Other | Source: Ambulatory Visit | Attending: Neurosurgery | Admitting: Neurosurgery

## 2018-05-16 ENCOUNTER — Telehealth: Payer: Self-pay

## 2018-05-16 DIAGNOSIS — M48061 Spinal stenosis, lumbar region without neurogenic claudication: Secondary | ICD-10-CM | POA: Insufficient documentation

## 2018-05-16 DIAGNOSIS — Z981 Arthrodesis status: Secondary | ICD-10-CM | POA: Diagnosis not present

## 2018-05-16 DIAGNOSIS — M4800 Spinal stenosis, site unspecified: Secondary | ICD-10-CM

## 2018-05-16 DIAGNOSIS — M5136 Other intervertebral disc degeneration, lumbar region: Secondary | ICD-10-CM | POA: Insufficient documentation

## 2018-05-16 DIAGNOSIS — IMO0002 Reserved for concepts with insufficient information to code with codable children: Secondary | ICD-10-CM

## 2018-05-16 NOTE — Telephone Encounter (Signed)
Post procedure phone call.  Patient states she is doing well.  

## 2018-05-17 ENCOUNTER — Other Ambulatory Visit: Payer: Medicare Other

## 2018-05-17 NOTE — Progress Notes (Signed)
[facility] Consultation      Assessment and Plan:  Lung nodule. - Small groundglass nodule in left upper lobe, initially seen September 2018. - Continue surveillance for 3 to 5 years.  Repeat CT chest in 3 months follow-up after.  Pulmonary fibrosis. -Right upper lobe honeycomb changing with traction bronchiectasis, which does not appear typical for UIP, however the patient does have bibasilar subpleural reticular changes which do appear suggestive of UIP. - Currently the patient does not appear to be symptomatic, will continue to monitor. - Patient does have some swelling of the distal interphalangeal joints on her fingers, will check a rheumatoid factor.  Orders Placed This Encounter  Procedures  . CT CHEST WO CONTRAST  . Rheumatoid Factor  . Angiotensin converting enzyme   Return in about 3 months (around 08/18/2018).     Date: 05/18/2018  MRN# [id number] Melody Ochoa Sep 07, 1941   Melody Ochoa is a 77 y.o. old female seen in consultation for chief complaint of:    Chief Complaint  Patient presents with  . Consult    Referred by M. Arnett for pulmonary nodule  . Cough    nite-time non productive    HPI:   The patient is a 77 year old female with a history of chronic bronchitis, cough.  She had previously seen Dr. Raul Del in April 2017.  At that time it was noted that she had basilar scarring, and elevated right diaphragm.  She denies dyspnea, she has occasional cough but not really bothersome.  She denies sinus drainage or reflux.  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 She has always lived in this area. No travel outside the country.  She has never been diagnosed with cancer, and has never had radiation. She has never had pneumonia. She does have arthritis, and goes to pain management for her back, she has been told that she has arthritis, not sure if it is inflammatory.  She does  snore at night, does not feel sleepy during the day.   Imaging personally reviewed, CT chest 08/19/2017; right upper lobe honeycomb fibrosis peripherally.  Some extends medially with traction bronchiectasis, mild subpleural interstitial changes bilaterally.  Elevated right diaphragm.  There is a 1.2 cm semisolid nodule in the left upper lobe.   PMHX:   Past Medical History:  Diagnosis Date  . Arthritis   . Atheromatous plaque 10/18/2017  . Back pain   . Cancer ([place])    skin  . Depression   . Diverticulosis   . Dizziness    patient had episode of dizziness when came in room. hx past no dx  . GERD (gastroesophageal reflux disease)   . Heart murmur   . Hyperlipidemia 10/18/2017  . PONV (postoperative nausea and vomiting)    yrs ago  . Stroke [facility])    Surgical Hx:  Past Surgical History:  Procedure Laterality Date  . BACK SURGERY  12  . BREAST BIOPSY Bilateral    cores "years ago"  . BREAST EXCISIONAL BIOPSY Left 90's  . CERVICAL DISC SURGERY  09  . COLONOSCOPY WITH PROPOFOL N/A 01/13/2016   Procedure: COLONOSCOPY WITH PROPOFOL;  Surgeon: [name], MD;  Location: [facility] ENDOSCOPY;  Service: Endoscopy;  Laterality: N/A;  . FACELIFT  94  . LOOP RECORDER INSERTION N/A 08/23/2017   Procedure: LOOP RECORDER INSERTION;  Surgeon: Thompson Grayer, MD;  Location: [facility];  Service: Cardiovascular;  Laterality:  N/A;  . TEE WITHOUT CARDIOVERSION N/A 08/12/2017   Procedure: TRANSESOPHAGEAL ECHOCARDIOGRAM (TEE);  Surgeon: Sueanne Margarita, MD;  Location: [facility] ENDOSCOPY;  Service: Cardiovascular;  Laterality: N/A;  . TONSILLECTOMY    . TOTAL HIP ARTHROPLASTY Right 04/22/2016   Procedure: TOTAL HIP ARTHROPLASTY;  Surgeon: Corky Mull, MD;  Location: [facility] ORS;  Service: Orthopedics;  Laterality: Right;   Family Hx:  Family History  Problem Relation Age of Onset  . Lung cancer Father   . Aneurysm Brother   . Stroke Brother   . Diabetes Maternal Grandmother   . Kidney disease Maternal  Grandfather   . Cushing syndrome Paternal Grandmother   . Dementia Paternal Grandfather   . Breast cancer Maternal Aunt 52   Social Hx:   Social History   Tobacco Use  . Smoking status: Former Smoker    Packs/day: 1.00    Years: 40.00    Pack years: 40.00    Types: Cigarettes    Last attempt to quit: 11/13/1996    Years since quitting: 21.5  . Smokeless tobacco: Never Used  Substance Use Topics  . Alcohol use: Yes    Comment: occ wine  . Drug use: No   Medication:    Current Outpatient Medications:  .  acetaminophen (TYLENOL) 500 MG tablet, Take 2 tablets (1,000 mg total) by mouth every 8 (eight) hours as needed for mild pain or moderate pain., Disp: 20 tablet, Rfl: 2 .  atorvastatin (LIPITOR) 80 MG tablet, Take 1 tablet (80 mg total) daily at 6 PM by mouth., Disp: 90 tablet, Rfl: 3 .  baclofen (LIORESAL) 10 MG tablet, Take 1 tablet (10 mg total) by mouth 2 (two) times daily as needed for muscle spasms., Disp: 60 tablet, Rfl: 0 .  buPROPion (WELLBUTRIN XL) 300 MG 24 hr tablet, Take 300 mg by mouth daily. , Disp: , Rfl:  .  Calcium Carbonate-Vitamin D (CALTRATE 600+D PO), Take 1 tablet by mouth daily., Disp: , Rfl:  .  clopidogrel (PLAVIX) 75 MG tablet, Take 1 tablet (75 mg total) by mouth daily., Disp: 30 tablet, Rfl: 0 .  Cyanocobalamin (VITAMIN B-12 PO), Take 1 tablet by mouth daily., Disp: , Rfl:  .  DULoxetine (CYMBALTA) 60 MG capsule, Take 60 mg by mouth 2 (two) times daily. , Disp: , Rfl:  .  ferrous sulfate 325 (65 FE) MG tablet, Take 1 tablet (325 mg total) by mouth daily., Disp: 30 tablet, Rfl: 0 .  HYDROcodone-acetaminophen (NORCO/VICODIN) 5-325 MG tablet, Take 1 tablet by mouth 2 (two) times daily as needed for moderate pain. For chronic pain To last for 30 days from fill date, Disp: 60 tablet, Rfl: 0 .  mupirocin ointment (BACTROBAN) 2 %, Apply 1 application topically 2 (two) times daily., Disp: 22 g, Rfl: 1 .  triamcinolone cream (KENALOG) 0.1 %, Apply 1 application  topically as needed., Disp: , Rfl: 0 .  vitamin C (ASCORBIC ACID) 500 MG tablet, Take 500 mg by mouth daily., Disp: , Rfl:    Allergies:  Aleve [naproxen sodium]; Aspirin; Celebrex [celecoxib]; Effexor [venlafaxine]; Hydrocodone-homatropine; Ibuprofen; Plavix [clopidogrel bisulfate]; Tape; Vioxx [rofecoxib]; Other; and Tramadol  Review of Systems: Gen:  Denies  fever, sweats, chills HEENT: Denies blurred vision, double vision. bleeds, sore throat Cvc:  No dizziness, chest pain. Resp:   Denies cough or sputum production, shortness of breath Gi: Denies swallowing difficulty, stomach pain. Gu:  Denies bladder incontinence, burning urine Ext:   No Joint pain, stiffness. Skin: No skin rash,  hives  Endoc:  No polyuria, polydipsia. Psych: No depression, insomnia. Other:  All other systems were reviewed with the patient and were negative other that what is mentioned in the HPI.   Physical Examination:   VS: BP 122/80 (BP Location: Left Arm, Cuff Size: Large)   Pulse 88   Resp 16   Ht 5\' 4"  (1.626 m)   Wt 192 lb (87.1 kg)   SpO2 96%   BMI 32.96 kg/m   General Appearance: No distress  Neuro:without focal findings,  speech normal,  HEENT: PERRLA, EOM intact.   Pulmonary: normal breath sounds, No wheezing.  CardiovascularNormal S1,S2.  No m/r/g.   Abdomen: Benign, Soft, non-tender. Renal:  No costovertebral tenderness  GU:  No performed at this time. Endoc: No evident thyromegaly, no signs of acromegaly. Skin:   warm, no rashes, no ecchymosis  Extremities: enlarged DIP.  no cyanosis, clubbing.  Other findings:    LABORATORY PANEL:   CBC No results for input(s): WBC, HGB, HCT, PLT in the last 168 hours. ------------------------------------------------------------------------------------------------------------------  Chemistries  No results for input(s): NA, K, CL, CO2, GLUCOSE, BUN, CREATININE, CALCIUM, MG, AST, ALT, ALKPHOS, BILITOT in the last 168 hours.  Invalid  input(s): GFRCGP ------------------------------------------------------------------------------------------------------------------  Cardiac Enzymes No results for input(s): TROPONINI in the last 168 hours. ------------------------------------------------------------  RADIOLOGY:  No results found.     Thank  you for the consultation and for allowing [facility] to assist in the care of your patient. Our recommendations are noted above.  Please contact us if we can be of further service.   Marda Stalker, MD.  Board Certified in Internal Medicine, Pulmonary Medicine, [place], and Sleep Medicine.   Pulmonary and Critical Care Office Number: [phone]  Patricia Pesa, M.D.  Merton Border, M.D  05/18/2018

## 2018-05-18 ENCOUNTER — Encounter: Payer: Self-pay | Admitting: Internal Medicine

## 2018-05-18 ENCOUNTER — Ambulatory Visit (INDEPENDENT_AMBULATORY_CARE_PROVIDER_SITE_OTHER): Payer: Medicare Other | Admitting: Internal Medicine

## 2018-05-18 VITALS — BP 122/80 | HR 88 | Resp 16 | Ht 64.0 in | Wt 192.0 lb

## 2018-05-18 DIAGNOSIS — M1990 Unspecified osteoarthritis, unspecified site: Secondary | ICD-10-CM

## 2018-05-18 DIAGNOSIS — R0609 Other forms of dyspnea: Secondary | ICD-10-CM

## 2018-05-18 DIAGNOSIS — I63219 Cerebral infarction due to unspecified occlusion or stenosis of unspecified vertebral arteries: Secondary | ICD-10-CM | POA: Diagnosis not present

## 2018-05-18 DIAGNOSIS — R911 Solitary pulmonary nodule: Secondary | ICD-10-CM

## 2018-05-18 DIAGNOSIS — R0600 Dyspnea, unspecified: Secondary | ICD-10-CM

## 2018-05-18 NOTE — Patient Instructions (Signed)
Will send you for a repeat Ct chest in about 3 months and see you after that.  Will send you for blood work.

## 2018-05-19 ENCOUNTER — Telehealth: Payer: Self-pay | Admitting: Student in an Organized Health Care Education/Training Program

## 2018-05-19 NOTE — Telephone Encounter (Signed)
Attempted to call patient to discuss increase in medication.  Left message that I could not advise her and Dr Holley Raring was not in office today.  Instructed her to call back.

## 2018-05-19 NOTE — Telephone Encounter (Signed)
Patient is in a lot of pain and wants to know if she can increase amount of pain meds she takes so she can go see her grandson graduate tonight? Please call asap

## 2018-05-22 IMAGING — CR DG CHEST 2V
2 series · 2 of 2 positions shown · non-contrast
Comparison: April 21, 2016

CLINICAL DATA: Chest and left arm pain

EXAM:
CHEST  2 VIEW

[chest pa]
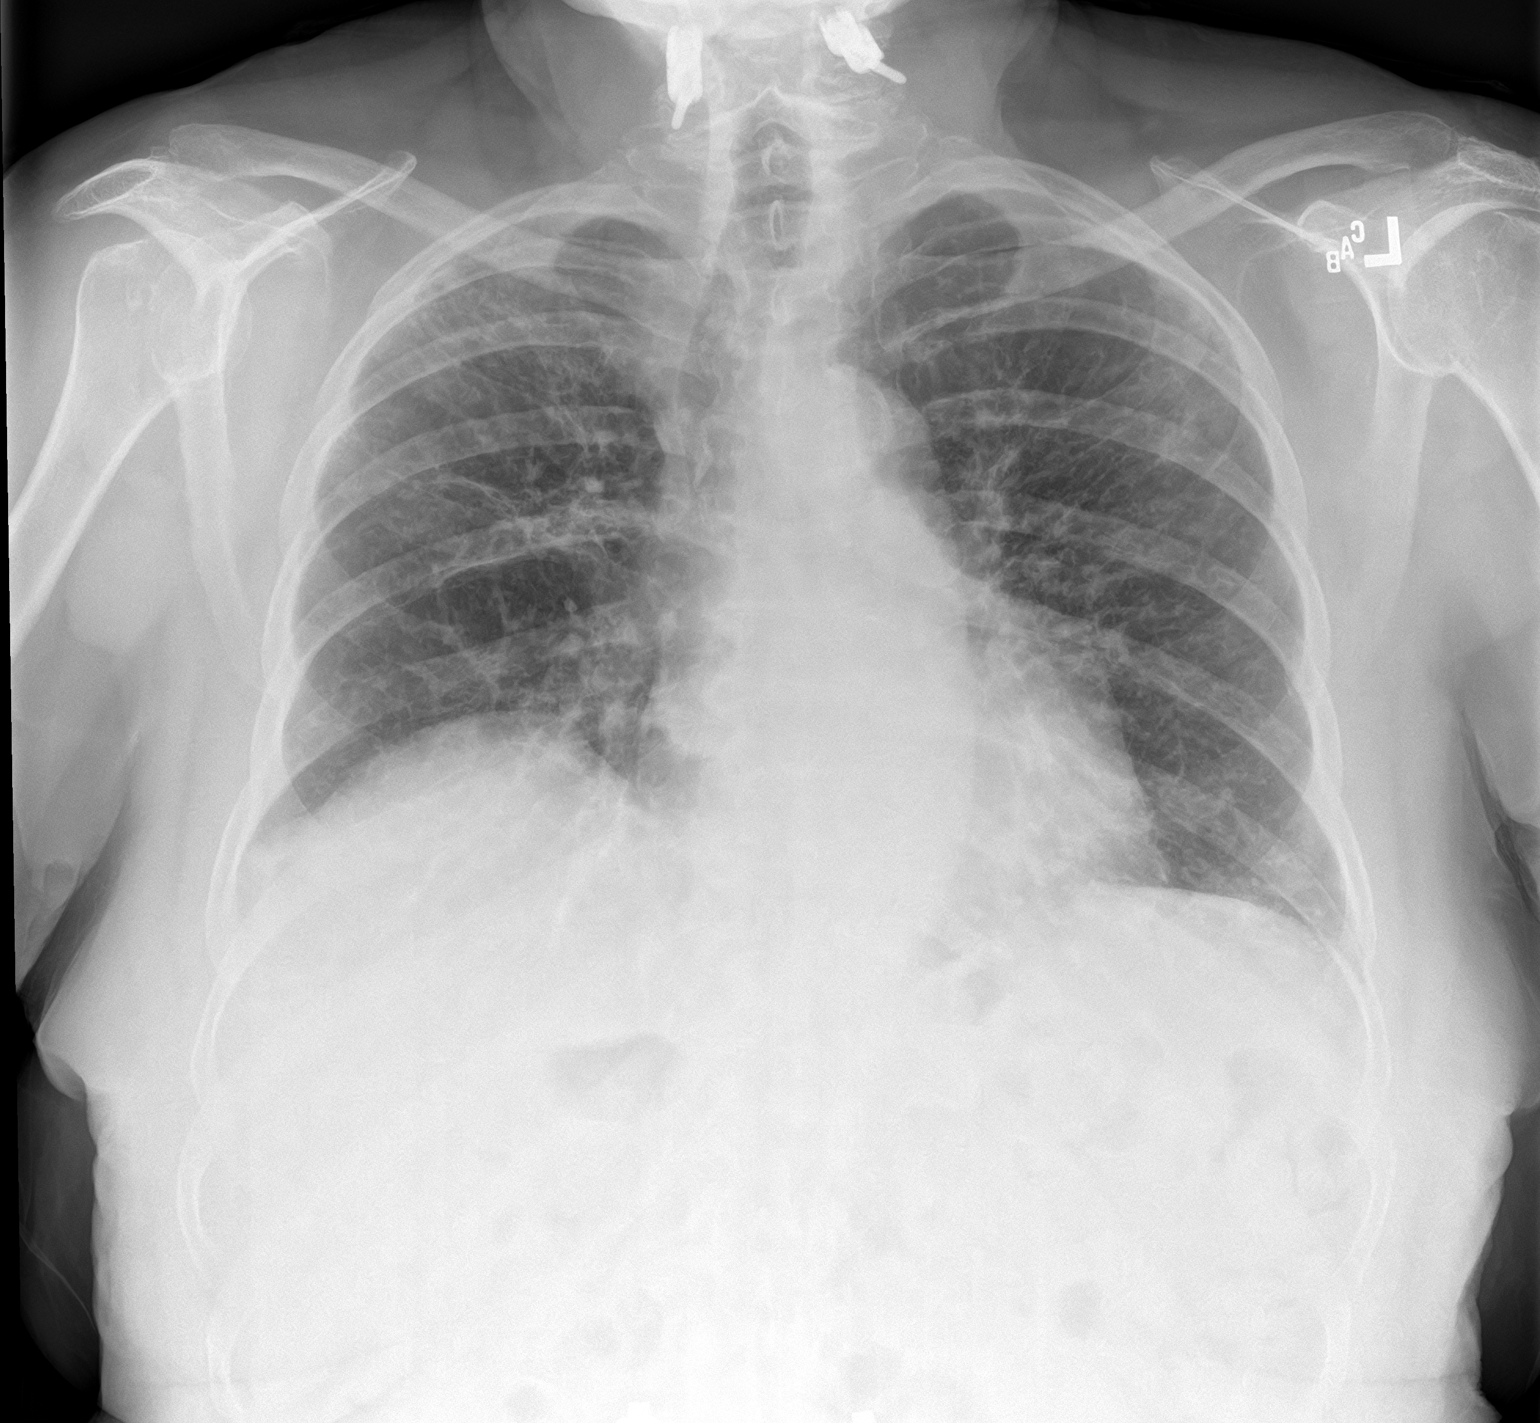

[chest lat]
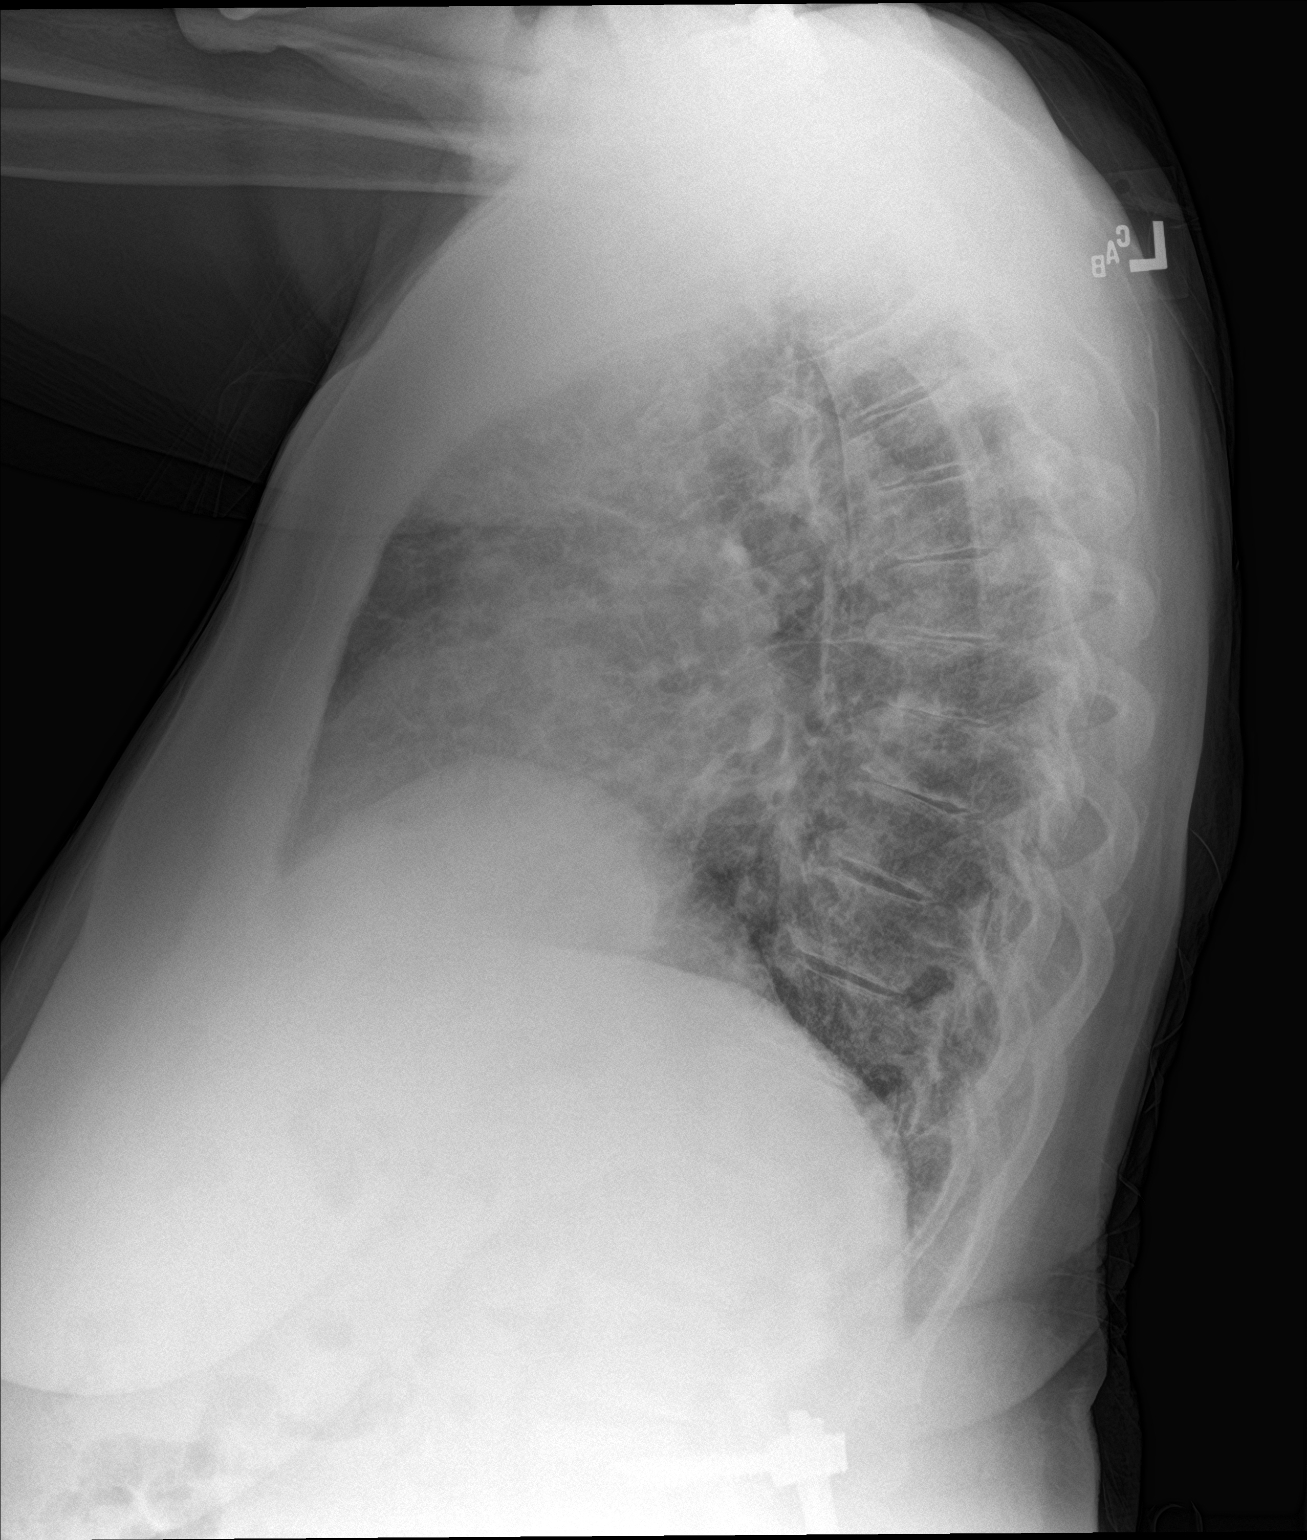

[2 of 2 positions shown; findings below may reference images not displayed]

FINDINGS: There are areas of scarring bilaterally, stable. There is no edema
or consolidation. There is a 1.4 x 1.3 cm nodular opacity in the
left upper lobe region. Heart size and pulmonary vascularity are
normal. There is aortic atherosclerosis. No evident adenopathy.
There is postoperative change in the lower cervical spine. There is
degenerative change in the thoracic spine.
IMPRESSION: 1.4 x 1.3 cm nodular opacity left upper lobe. This finding warrants
noncontrast enhanced chest CT to further assess.

Areas of scarring bilaterally. No edema or consolidation. Stable
cardiac silhouette. There is aortic atherosclerosis.

Aortic Atherosclerosis (40IX3-C0K.K).

## 2018-05-24 ENCOUNTER — Telehealth: Payer: Self-pay | Admitting: Family

## 2018-05-24 ENCOUNTER — Telehealth: Payer: Self-pay | Admitting: Cardiology

## 2018-05-24 DIAGNOSIS — L820 Inflamed seborrheic keratosis: Secondary | ICD-10-CM | POA: Diagnosis not present

## 2018-05-24 DIAGNOSIS — R234 Changes in skin texture: Secondary | ICD-10-CM | POA: Diagnosis not present

## 2018-05-24 DIAGNOSIS — L720 Epidermal cyst: Secondary | ICD-10-CM | POA: Diagnosis not present

## 2018-05-24 LAB — CUP PACEART REMOTE DEVICE CHECK
Date Time Interrogation Session: 20190525183645
Implantable Pulse Generator Implant Date: 20180918

## 2018-05-24 NOTE — Telephone Encounter (Signed)
Spoke w/ pt and requested that she send a manual transmission b/c her home monitor has not updated in at least 14 days.   

## 2018-05-24 NOTE — Telephone Encounter (Unsigned)
Copied from [place] [id number]. Topic: Quick Communication - See Telephone Encounter >> May 24, 2018 11:22 AM Marja Kays F wrote: Pt is needing a refill on bupropion and duloxetine  [facility] [address]   Best number [phone]

## 2018-05-25 NOTE — Telephone Encounter (Signed)
LOV  03/31/18 [name] No provider name associated with medications.

## 2018-05-26 NOTE — Telephone Encounter (Signed)
Call pt  I have refill request for cymbalta, wellbutrin.   I am not sure who orginally prescribed however I would not be comfortable with her being on high doses of both as there is a drig interaction and risk of lower seizure threshold  I would advise to decrease the wellbutrin to 150mg  - or come off of if not sure if helping depression.   Let me know

## 2018-05-30 NOTE — Telephone Encounter (Signed)
Spoke with patient advised of below .   Medication was originally prescribed by Dr Marchelle Gearing.   She will check with Dr Marchelle Gearing in regards to refill.

## 2018-05-30 NOTE — Telephone Encounter (Signed)
Do you mind calling this patient please? Thank you

## 2018-06-01 ENCOUNTER — Ambulatory Visit (INDEPENDENT_AMBULATORY_CARE_PROVIDER_SITE_OTHER): Payer: Medicare Other | Admitting: *Deleted

## 2018-06-01 DIAGNOSIS — I63219 Cerebral infarction due to unspecified occlusion or stenosis of unspecified vertebral arteries: Secondary | ICD-10-CM | POA: Diagnosis not present

## 2018-06-01 DIAGNOSIS — R2689 Other abnormalities of gait and mobility: Secondary | ICD-10-CM | POA: Diagnosis not present

## 2018-06-01 DIAGNOSIS — Z981 Arthrodesis status: Secondary | ICD-10-CM | POA: Diagnosis not present

## 2018-06-01 DIAGNOSIS — M4800 Spinal stenosis, site unspecified: Secondary | ICD-10-CM | POA: Diagnosis not present

## 2018-06-02 NOTE — Progress Notes (Signed)
Carelink Summary Report / Loop Recorder 

## 2018-06-14 ENCOUNTER — Ambulatory Visit: Payer: Medicare Other | Admitting: Student in an Organized Health Care Education/Training Program

## 2018-06-22 ENCOUNTER — Ambulatory Visit: Payer: Medicare Other | Admitting: Student in an Organized Health Care Education/Training Program

## 2018-06-26 ENCOUNTER — Telehealth: Payer: Self-pay

## 2018-06-26 NOTE — Telephone Encounter (Signed)
I spoke with the patient about this.

## 2018-06-27 ENCOUNTER — Telehealth: Payer: Self-pay | Admitting: Cardiology

## 2018-06-27 NOTE — Telephone Encounter (Signed)
Spoke w/ pt and requested that she send a manual transmission b/c her home monitor has not updated in at least 14 days.   

## 2018-06-30 ENCOUNTER — Encounter: Payer: Self-pay | Admitting: Family

## 2018-06-30 ENCOUNTER — Ambulatory Visit (INDEPENDENT_AMBULATORY_CARE_PROVIDER_SITE_OTHER): Payer: Medicare Other | Admitting: Family

## 2018-06-30 VITALS — BP 136/74 | HR 84 | Temp 98.4°F | Ht 64.0 in | Wt 195.2 lb

## 2018-06-30 DIAGNOSIS — R05 Cough: Secondary | ICD-10-CM | POA: Insufficient documentation

## 2018-06-30 DIAGNOSIS — M5416 Radiculopathy, lumbar region: Secondary | ICD-10-CM

## 2018-06-30 DIAGNOSIS — F325 Major depressive disorder, single episode, in full remission: Secondary | ICD-10-CM | POA: Diagnosis not present

## 2018-06-30 DIAGNOSIS — R059 Cough, unspecified: Secondary | ICD-10-CM | POA: Insufficient documentation

## 2018-06-30 DIAGNOSIS — R0982 Postnasal drip: Secondary | ICD-10-CM | POA: Diagnosis not present

## 2018-06-30 DIAGNOSIS — I63219 Cerebral infarction due to unspecified occlusion or stenosis of unspecified vertebral arteries: Secondary | ICD-10-CM

## 2018-06-30 LAB — CUP PACEART REMOTE DEVICE CHECK
Date Time Interrogation Session: 20190627193553
Implantable Pulse Generator Implant Date: 20180918

## 2018-06-30 NOTE — Progress Notes (Signed)
Subjective:    Patient ID: Melody Ochoa, female    DOB: [birth year], 77 y.o.   MRN: [id number]  CC: Melody Ochoa is a 76 y.o. female who presents today for follow up.   HPI:   Notes cough x 2 months, unchanged.   No fever, sob, wheezing. 'tickle' at bedtime. Resolves with a little water. Thinks post nasal drip.   No choking, sore throat, ear pain  Notes that she did see Melody Ochoa a couple of years ago, he started her on PPI at that time since h2b didn't work. No longer on PPI as not having epigastric pain.  No trouble swallowing, hoarseness.   Back pain is 75% better than had at last visit.   Was seen in ED after fall. Following with Melody Ochoa, with improvement after epidural injections.   No appointment with Melody Ochoa unless needed. She plans to follow with Melody Ochoa.  Nodule in finger resolved. No pain, swelling, or stiffness in hands, or wrists. Declines lab work for autoimmune.   Depression- has been on wellbutrin, cymbalta for years and done very well. No h/o seizure or heavy drinking. Has been managed by psychiatry to this point   Established with Melody Rogelia Mire 05/2018  for pulmonary nodule, fibrosis continue surveillance, Never had  Rheumatoid factor , Angiotension converting enzyme labs done.  Nodule on finger- ? Nehemiah Massed HISTORY:  Past Medical History:  Diagnosis Date  . Arthritis   . Atheromatous plaque 10/18/2017  . Back pain   . Cancer ([place])    skin  . Depression   . Diverticulosis   . Dizziness    patient had episode of dizziness when came in room. hx past no dx  . GERD (gastroesophageal reflux disease)   . Heart murmur   . Hyperlipidemia 10/18/2017  . PONV (postoperative nausea and vomiting)    yrs ago  . Stroke [facility])    Past Surgical History:  Procedure Laterality Date  . BACK SURGERY  13  . BREAST BIOPSY Bilateral    cores "years ago"  . BREAST EXCISIONAL BIOPSY Left 90's  . CERVICAL DISC SURGERY  09  . COLONOSCOPY WITH PROPOFOL  N/A 01/13/2016   Procedure: COLONOSCOPY WITH PROPOFOL;  Surgeon: Melody Lame, MD;  Location: [facility] ENDOSCOPY;  Service: Endoscopy;  Laterality: N/A;  . FACELIFT  94  . LOOP RECORDER INSERTION N/A 08/23/2017   Procedure: LOOP RECORDER INSERTION;  Surgeon: Melody Grayer, MD;  Location: [facility];  Service: Cardiovascular;  Laterality: N/A;  . TEE WITHOUT CARDIOVERSION N/A 08/12/2017   Procedure: TRANSESOPHAGEAL ECHOCARDIOGRAM (TEE);  Surgeon: Melody Margarita, MD;  Location: [facility] ENDOSCOPY;  Service: Cardiovascular;  Laterality: N/A;  . TONSILLECTOMY    . TOTAL HIP ARTHROPLASTY Right 04/22/2016   Procedure: TOTAL HIP ARTHROPLASTY;  Surgeon: Melody Mull, MD;  Location: [facility] ORS;  Service: Orthopedics;  Laterality: Right;   Family History  Problem Relation Age of Onset  . Lung cancer Father   . Aneurysm Brother   . Stroke Brother   . Diabetes Maternal Grandmother   . Kidney disease Maternal Grandfather   . Cushing syndrome Paternal Grandmother   . Dementia Paternal Grandfather   . Breast cancer Maternal Aunt 52    Allergies: Aleve [naproxen sodium]; Aspirin; Celebrex [celecoxib]; Effexor [venlafaxine]; Hydrocodone-homatropine; Ibuprofen; Plavix [clopidogrel bisulfate]; Tape; Vioxx [rofecoxib]; Other; and Tramadol Current Outpatient Medications on File Prior to Visit  Medication Sig Dispense Refill  . acetaminophen (TYLENOL) 500 MG tablet Take 2 tablets (1,000  mg total) by mouth every 8 (eight) hours as needed for mild pain or moderate pain. 20 tablet 2  . atorvastatin (LIPITOR) 80 MG tablet Take 1 tablet (80 mg total) daily at 6 PM by mouth. 90 tablet 3  . baclofen (LIORESAL) 10 MG tablet Take 1 tablet (10 mg total) by mouth 2 (two) times daily as needed for muscle spasms. 60 tablet 0  . buPROPion (WELLBUTRIN XL) 300 MG 24 hr tablet Take 300 mg by mouth daily.     . Calcium Carbonate-Vitamin D (CALTRATE 600+D PO) Take 1 tablet by mouth daily.    . clopidogrel (PLAVIX) 75 MG tablet Take 1  tablet (75 mg total) by mouth daily. 30 tablet 0  . Cyanocobalamin (VITAMIN B-12 PO) Take 1 tablet by mouth daily.    . DULoxetine (CYMBALTA) 60 MG capsule Take 60 mg by mouth 2 (two) times daily.     . ferrous sulfate 325 (65 FE) MG tablet Take 1 tablet (325 mg total) by mouth daily. 30 tablet 0  . mupirocin ointment (BACTROBAN) 2 % Apply 1 application topically 2 (two) times daily. 22 g 1  . triamcinolone cream (KENALOG) 0.1 % Apply 1 application topically as needed.  0  . vitamin C (ASCORBIC ACID) 500 MG tablet Take 500 mg by mouth daily.     No current facility-administered medications on file prior to visit.     Social History   Tobacco Use  . Smoking status: Former Smoker    Packs/day: 1.00    Years: 40.00    Pack years: 40.00    Types: Cigarettes    Last attempt to quit: 11/13/1996    Years since quitting: 21.6  . Smokeless tobacco: Never Used  Substance Use Topics  . Alcohol use: Yes    Comment: occ wine  . Drug use: No    Review of Systems  Constitutional: Negative for chills and fever.  HENT: Positive for postnasal drip. Negative for ear pain, sinus pressure, sore throat, trouble swallowing and voice change.   Respiratory: Negative for cough, shortness of breath and wheezing.   Cardiovascular: Negative for chest pain and palpitations.  Gastrointestinal: Negative for nausea and vomiting.  Musculoskeletal: Positive for back pain (chronic). Negative for joint swelling.  Skin: Negative for rash and wound (resolved).      Objective:    BP 136/74 (BP Location: Left Arm, Patient Position: Sitting, Cuff Size: Large)   Pulse 84   Temp 98.4 F (36.9 C) (Oral)   Ht 5\' 4"  (1.626 m)   Wt 195 lb 4 oz (88.6 kg)   SpO2 96%   BMI 33.51 kg/m  BP Readings from Last 3 Encounters:  06/30/18 136/74  05/18/18 122/80  05/15/18 (!) 152/84   Wt Readings from Last 3 Encounters:  06/30/18 195 lb 4 oz (88.6 kg)  05/18/18 192 lb (87.1 kg)  05/15/18 199 lb (90.3 kg)    Physical  Exam  Constitutional: She appears well-developed and well-nourished.  HENT:  Head: Normocephalic and atraumatic.  Right Ear: Hearing, tympanic membrane, external ear and ear canal normal. No drainage, swelling or tenderness. No foreign bodies. Tympanic membrane is not erythematous and not bulging. No middle ear effusion. No decreased hearing is noted.  Left Ear: Hearing, tympanic membrane, external ear and ear canal normal. No drainage, swelling or tenderness. No foreign bodies. Tympanic membrane is not erythematous and not bulging.  No middle ear effusion. No decreased hearing is noted.  Nose: Nose normal. No rhinorrhea. Right sinus  exhibits no maxillary sinus tenderness and no frontal sinus tenderness. Left sinus exhibits no maxillary sinus tenderness and no frontal sinus tenderness.  Mouth/Throat: Uvula is midline, oropharynx is clear and moist and mucous membranes are normal. No oropharyngeal exudate, posterior oropharyngeal edema, posterior oropharyngeal erythema or tonsillar abscesses.  Eyes: Conjunctivae are normal.  Cardiovascular: Regular rhythm, normal heart sounds and normal pulses.  Pulmonary/Chest: Effort normal and breath sounds normal. She has no wheezes. She has no rhonchi. She has no rales.  Lymphadenopathy:       Head (right side): No submental, no submandibular, no tonsillar, no preauricular, no posterior auricular and no occipital adenopathy present.       Head (left side): No submental, no submandibular, no tonsillar, no preauricular, no posterior auricular and no occipital adenopathy present.    She has no cervical adenopathy.  Neurological: She is alert.  Skin: Skin is warm and dry.  Psychiatric: She has a normal mood and affect. Her speech is normal and behavior is normal. Thought content normal.  Vitals reviewed.      Assessment & Plan:   Problem List Items Addressed This Visit      Nervous and Auditory   Lumbar radiculopathy    Pleased improvement with epidural  injection. Follows with Lateef, will follow        Other   Major depression in remission ([place])    Stable. Patient may ask me to take over cymbalta/wellbutrin from psychiatry. Advised I would be most comfortable with lower dose off wellbutrin due to drug -drug interaction with wellbutrin/cymbalta. Patient will trial 150mg  wellbutrin and let me know      Post-nasal drip - Primary    Reassuring HEENT exam. No acute respiratory distress. sa02 96%. Suspect cough from post nasal drip. Patient agreeable to conservative management and trial of Flonase OTC. She will let me know if not better          I have discontinued Beverley Fiedler Kulik's HYDROcodone-acetaminophen. I am also having her maintain her DULoxetine, Calcium Carbonate-Vitamin D (CALTRATE 600+D PO), buPROPion, vitamin C, Cyanocobalamin (VITAMIN B-12 PO), ferrous sulfate, clopidogrel, atorvastatin, mupirocin ointment, triamcinolone cream, baclofen, and acetaminophen.   No orders of the defined types were placed in this encounter.   Return precautions given.   Risks, benefits, and alternatives of the medications and treatment plan prescribed today were discussed, and patient expressed understanding.   Education regarding symptom management and diagnosis given to patient on AVS.  Continue to follow with Burnard Hawthorne, FNP for routine health maintenance.   [name] and I agreed with plan.   [name], FNP

## 2018-06-30 NOTE — Assessment & Plan Note (Signed)
Pleased improvement with epidural injection. Follows with Lateef, will follow

## 2018-06-30 NOTE — Assessment & Plan Note (Signed)
Stable. Patient may ask me to take over cymbalta/wellbutrin from psychiatry. Advised I would be most comfortable with lower dose off wellbutrin due to drug -drug interaction with wellbutrin/cymbalta. Patient will trial 150mg  wellbutrin and let me know

## 2018-06-30 NOTE — Assessment & Plan Note (Signed)
Reassuring HEENT exam. No acute respiratory distress. sa02 96%. Suspect cough from post nasal drip. Patient agreeable to conservative management and trial of Flonase OTC. She will let me know if not better

## 2018-06-30 NOTE — Patient Instructions (Addendum)
Trial of flonase OTC.   Trial also of lower dose of wellbutrin 150mg  and let me know if you feel okay  Loved seeing you today!  Ensure you have CT Chest with pulmonology as discussed in September 2019.

## 2018-07-04 ENCOUNTER — Ambulatory Visit (INDEPENDENT_AMBULATORY_CARE_PROVIDER_SITE_OTHER): Payer: Medicare Other | Admitting: *Deleted

## 2018-07-04 DIAGNOSIS — I63219 Cerebral infarction due to unspecified occlusion or stenosis of unspecified vertebral arteries: Secondary | ICD-10-CM

## 2018-07-05 NOTE — Progress Notes (Signed)
Carelink Summary Report / Loop Recorder 

## 2018-07-10 ENCOUNTER — Ambulatory Visit: Payer: Medicare Other | Attending: Neurosurgery

## 2018-07-10 DIAGNOSIS — R2689 Other abnormalities of gait and mobility: Secondary | ICD-10-CM | POA: Diagnosis not present

## 2018-07-10 DIAGNOSIS — Z9181 History of falling: Secondary | ICD-10-CM | POA: Diagnosis not present

## 2018-07-10 NOTE — Therapy (Signed)
[place] MAIN [facility] SERVICES [address] [place], [place], [address] Phone: [phone]   Fax:  [phone]  Physical Therapy Evaluation  Patient Details  Name: Melody Ochoa MRN: [id number] Date of Birth: 1941/07/30 No data recorded  Encounter Date: 07/10/2018  PT End of Session - 07/10/18 1307    Visit Number  1    Number of Visits  8    Authorization Type  1/10 PN start 8/5     PT Start Time  1102    PT Stop Time  1156    PT Time Calculation (min)  54 min    Equipment Utilized During Treatment  Gait belt    Activity Tolerance  Patient tolerated treatment well    Behavior During Therapy  WFL for tasks assessed/performed       Past Medical History:  Diagnosis Date  . Arthritis   . Atheromatous plaque 10/18/2017  . Back pain   . Cancer ([place])    skin  . Depression   . Diverticulosis   . Dizziness    patient had episode of dizziness when came in room. hx past no dx  . GERD (gastroesophageal reflux disease)   . Heart murmur   . Hyperlipidemia 10/18/2017  . PONV (postoperative nausea and vomiting)    yrs ago  . Stroke [facility])     Past Surgical History:  Procedure Laterality Date  . BACK SURGERY  13  . BREAST BIOPSY Bilateral    cores "years ago"  . BREAST EXCISIONAL BIOPSY Left 90's  . CERVICAL DISC SURGERY  09  . COLONOSCOPY WITH PROPOFOL N/A 01/13/2016   Procedure: COLONOSCOPY WITH PROPOFOL;  Surgeon: [name], MD;  Location: [facility] ENDOSCOPY;  Service: Endoscopy;  Laterality: N/A;  . FACELIFT  94  . LOOP RECORDER INSERTION N/A 08/23/2017   Procedure: LOOP RECORDER INSERTION;  Surgeon: Thompson Grayer, MD;  Location: [place] CV LAB;  Service: Cardiovascular;  Laterality: N/A;  . TEE WITHOUT CARDIOVERSION N/A 08/12/2017   Procedure: TRANSESOPHAGEAL ECHOCARDIOGRAM (TEE);  Surgeon: Sueanne Margarita, MD;  Location: [facility] ENDOSCOPY;  Service: Cardiovascular;  Laterality: N/A;  . TONSILLECTOMY    . TOTAL HIP ARTHROPLASTY Right  04/22/2016   Procedure: TOTAL HIP ARTHROPLASTY;  Surgeon: Corky Mull, MD;  Location: [facility] ORS;  Service: Orthopedics;  Laterality: Right;    There were no vitals filed for this visit.   Subjective Assessment - 07/10/18 1112    Subjective  Patient is a pleasant 77 year old female who presents with instability/imbalance.     Pertinent History  Patient reports starting feeling unsteady about two years ago. When goes down steps L foot would catch on steps. Reports falling a lot. In the past six months has fallen 3 x. Patient uses hurricane in public but not at home. Patient took a pain pill for L hip pain. Has a history of L3-5 posterior spinal fusion with decompression with injections. She has Spondylolisthesis of lumbar region; UTI (lower urinary tract infection); Low back pain; Personal history of colonic polyps; Status post total replacement of right hip; Stroke (cerebrum) (HCC) - Patchy small volume acute ischemic nonhemorrhagic superior left cerebellar infarct, left SCA territory, of undetermined etiology, with diffuse atherosclerosis; Depression, recurrent ([place]); Pulmonary nodules; Atheromatous plaque; Hyperlipidemia; Rash; Lumbar radiculopathy; History of stroke; Greater trochanteric bursitis of both hips; Degeneration of lumbar or lumbosacral intervertebral disc; Major depression in remission ([place]); Primary osteoarthritis of left hip; and Atherosclerosis of aorta (HCC) on their problem list  Limitations  Lifting;Standing;Walking;House hold activities;Other (comment)    How long can you sit comfortably?  right weight shift    How long can you stand comfortably?  1 minute    How long can you walk comfortably?  1 minute until pain stops patient    Patient Stated Goals  patient wants to move to beach and walk on beach, want to walk dog around campus at [employer]    Currently in Pain?  Yes    Pain Score  4     Pain Location  Hip    Pain Orientation  Left    Pain Descriptors / Indicators  Aching     Pain Type  Chronic pain    Pain Onset  More than a month ago    Pain Frequency  Constant    Pain Relieving Factors  pain medication    Effect of Pain on Daily Activities  limits mobility          [facility] PT Assessment - 07/10/18 0001      Assessment   Medical Diagnosis  instability    Onset Date/Surgical Date  -- 2 year    Hand Dominance  Right    Next MD Visit  -- going to doctor the 15th for an injection     Prior Therapy  no      Precautions   Precautions  Fall      Restrictions   Weight Bearing Restrictions  No      Balance Screen   Has the patient fallen in the past 6 months  Yes    How many times?  3    Has the patient had a decrease in activity level because of a fear of falling?   Yes    Is the patient reluctant to leave their home because of a fear of falling?   Yes      [place]  Private residence    Living Arrangements  Spouse/significant other    Available Help at Discharge  Family    Type of [place] to enter    Entrance Stairs-Number of Steps  2    Entrance Stairs-Rails  Right;Left;Cannot reach both    [place]  One level    [place] - quad;Grab bars - tub/shower      Prior Function   Level of Independence  Independent with basic ADLs;Requires assistive device for independence    Vocation  Retired    Leisure  wants to walk on [employer], walk dog on campus at [place]  Yes  (Pended)       Standardized Balance Assessment   Standardized Balance Assessment  Berg Balance Test  (Pended)       Berg Balance Test   Sit to Stand  Able to stand  independently using hands  (Pended)     Standing Unsupported  Able to stand 2 minutes with supervision  (Pended)     Sitting with Back Unsupported but Feet Supported on Floor or Stool  Able to sit safely and securely 2 minutes  (Pended)     Stand to Sit  Sits safely with minimal use of hands  (Pended)     Transfers  Able to  transfer safely, definite need of hands  (Pended)     Standing Unsupported with Eyes Closed  Able to stand 10 seconds with supervision  (  Pended)     Standing Ubsupported with Feet Together  Able to place feet together independently and stand for 1 minute with supervision  (Pended)     From Standing, Reach Forward with Outstretched Arm  Can reach forward >12 cm safely (5")  (Pended)     From Standing Position, Pick up Object from [place] to pick up shoe, needs supervision  (Pended)     From Standing Position, Turn to Look Behind Over each Shoulder  Turn sideways only but maintains balance  (Pended)     Turn 360 Degrees  Needs close supervision or verbal cueing  (Pended)     Standing Unsupported, Alternately Place Feet on Step/Stool  Able to complete 4 steps without aid or supervision  (Pended)     Standing Unsupported, One Foot in [place] help to step but can hold 15 seconds  (Pended)     Standing on One Leg  Able to lift leg independently and hold equal to or more than 3 seconds  (Pended)     Total Score  37  (Pended)           PAIN: L Hip: current: 4/10 Worst pain: 9/10 in L hip   POSTURE: Patient has weight shift over R hip in seated position   Standing: limited weight acceptance on LLE.   PROM/AROM: Limited L hip mobility although difficult to ascertain whether limitations are from pain, fear of pain, or limited muscle tissue length.    STRENGTH:  Graded on a 0-5 scale Muscle Group Left Right  Hip Flex 3+/5 4-/5  Hip Abd 3-/5 3+/5  Hip Add 3-/5 3+/5  Hip Ext 2+/5 2+/5  Hip IR/ER    Knee Flex 3/5 3+/5  Knee Ext 3/5 3+/5  Ankle DF 3/5 3+/5  Ankle PF 3/5 3+/5   SENSATION: Decreased sensation on R side    FUNCTIONAL MOBILITY: Decreased coordination L. ; functional R  Sit to stand:  Requires UE support on hand rails, attempt 1x with UE on knees with excessive posterior LOB requiring CGA to maintain stability  BALANCE: Dynamic Sitting Balance  Normal Able to  sit unsupported and weight shift across midline maximally   Good Able to sit unsupported and weight shift across midline moderately x  Good-/Fair+ Able to sit unsupported and weight shift across midline minimally   Fair Minimal weight shifting ipsilateral/front, difficulty crossing midline   Fair- Reach to ipsilateral side and unable to weight shift   Poor + Able to sit unsupported with min A and reach to ipsilateral side, unable to weight shift   Poor Able to sit unsupported with mod A and reach ipsilateral/front-can't cross midline     Standing Dynamic Balance  Normal Stand independently unsupported, able to weight shift and cross midline maximally   Good Stand independently unsupported, able to weight shift and cross midline moderately   Good-/Fair+ Stand independently unsupported, able to weight shift across midline minimally   Fair Stand independently unsupported, weight shift, and reach ipsilaterally, loss of balance when crossing midline x  Poor+ Able to stand with Min A and reach ipsilaterally, unable to weight shift   Poor Able to stand with Mod A and minimally reach ipsilaterally, unable to cross midline.     Static Sitting Balance  Normal Able to maintain balance against maximal resistance   Good Able to maintain balance against moderate resistance   Good-/Fair+ Accepts minimal resistance x  Fair Able to sit unsupported without balance loss and without  UE support   Poor+ Able to maintain with Minimal assistance from individual or chair   Poor Unable to maintain balance-requires mod/max support from individual or chair     Static Standing Balance  Normal Able to maintain standing balance against maximal resistance   Good Able to maintain standing balance against moderate resistance   Good-/Fair+ Able to maintain standing balance against minimal resistance   Fair Able to stand unsupported without UE support and without LOB for 1-2 min x  Fair- Requires Min A and UE support to  maintain standing without loss of balance   Poor+ Requires mod A and UE support to maintain standing without loss of balance   Poor Requires max A and UE support to maintain standing balance without loss       GAIT: Patient utilizes hurrycane to ambulate. Decreased weight acceptance on LLE noted with buckling/ excessive trunk lean L and R without warning resulting in near LOB indicating slight ataxic gait.   OUTCOME MEASURES: TEST Outcome Interpretation  5 times sit<>stand 21 sec with excessive UE support  >76 yo, >15 sec indicates increased risk for falls  10 meter walk test      18 seconds with hurrycane   =0.56        m/s <1.0 m/s indicates increased risk for falls; limited community ambulator  ABC 49.3% Low level of function.       Berg Balance Assessment 37/56 <36/56 (100% risk for falls), 37-45 (80% risk for falls); 46-51 (>50% risk for falls); 52-55 (lower risk <25% of falls)           Objective measurements completed on examination: See above findings.      treat Access Code: [email or web address]  URL: [email or web address]  Date: 07/10/2018  Prepared by: Janna Arch   Exercises  Seated Long Arc Quad - 10 reps - 1 sets - 5 hold - 1x daily - 7x weekly  Sit to Stand with Counter Support - 10 reps - 1 sets - 5 hold - 1x daily - 7x weekly  Seated Hip Abduction with Resistance - 10 reps - 1 sets - 5 hold - 1x daily - 7x weekly  Seated Hip Adduction Isometrics with Ball - 10 reps - 1 sets - 5 hold - 1x daily - 7x weekly  Standing March with Counter Support - 10 reps - 1 sets - 5 hold - 1x daily - 7x weekly          PT Education - 07/10/18 1307    Education Details  HEP, POC, goals.     Person(s) Educated  Patient    Methods  Explanation;Demonstration;Verbal cues    Comprehension  Verbalized understanding;Returned demonstration       PT Short Term Goals - 07/10/18 1313      PT SHORT TERM GOAL #1   Title  Patient will be independent in home exercise  program to improve strength/mobility for better functional independence with ADLs.    Baseline  HEp given     Time  2    Period  Weeks    Status  New    Target Date  07/24/18      PT SHORT TERM GOAL #2   Title  Patient will perform 1 STS without UE support     Baseline  requires UE support     Time  2    Period  Weeks    Status  New    Target Date  07/24/18  PT SHORT TERM GOAL #3   Title  Patient will deny any falls over past 2 weeks to demonstrate improved safety awareness at home and work.     Baseline  falls occasionally     Time  2    Period  Weeks    Status  New    Target Date  07/24/18      PT SHORT TERM GOAL #4   Title  Patient will increase BLE gross strength to 4/5 as to improve functional strength for independent gait, increased standing tolerance and increased ADL ability.    Baseline   gross 3/5     Time  2    Period  Weeks    Status  New    Target Date  07/24/18        PT Long Term Goals - 07/10/18 1315      PT LONG TERM GOAL #1   Title  Patient (> 42 years old) will complete five times sit to stand test in < 15 seconds indicating an increased LE strength and improved balance.    Baseline  21 seconds with excessive UE support     Time  4    Period  Weeks    Status  New    Target Date  08/07/18      PT LONG TERM GOAL #2   Title  Patient will increase 10 meter walk test to >1.50m/s as to improve gait speed for better community ambulation and to reduce fall risk.    Baseline  8/5: .56 m/s     Time  4    Period  Weeks    Status  New    Target Date  08/07/18      PT LONG TERM GOAL #3   Title  Patient will increase ABC scale score >80% to demonstrate better functional mobility and better confidence with ADLs.     Baseline  8/5: 49.3%    Time  4    Period  Weeks    Status  New    Target Date  08/07/18      PT LONG TERM GOAL #4   Title  Patient will demonstrate an improved Berg Balance Score of >43/56 as to demonstrate improved balance with ADLs  such as sitting/standing and transfer balance and reduced fall risk.     Baseline  8/5: 37/56    Time  4    Period  Weeks    Status  New    Target Date  08/07/18      PT LONG TERM GOAL #5   Title  Patient will return to walking dog around [place] to return to PLOF.    Baseline  Patient unable to walk dog due to feeling unstable and unsafe    Time  4    Period  Weeks    Status  New    Target Date  08/07/18             Plan - 07/10/18 1309    Clinical Impression Statement   Patient is a pleasant 77 year old female who presents with imbalance. Patient has a history of cerebellar stroke and multiple lumbar/cervical fusions. Patient challenged with dynamic and static balance with occasional random lunges to L and R without buckling of knee. Patient has profuse weakness of bilateral LE's however LLE is weaker than L. Decreased weight acceptance of LLE with ambulation with ataxic instability occasionally resulting in near LOB.  5x STS was performed in  21 seconds however required excessive UE support performing almost as if was a tricep dip demonstrating weak LE's. 10 MWT= 0.56 m/s with hurrycane. BERG balance was 37/56 with patient having difficulty with speed, single leg stance and dynamic stability. ABC= 49.3%. Patient would benefit from skilled physical therapy to improve stability, balance, and LE strength for decreased fall risk.     History and Personal Factors relevant to plan of care:  This patient presents with 3, personal factors/ comorbidities  and , 4  body elements including body structures and functions, activity limitations and or participation restrictions. Patient's condition is, unstable    Clinical Presentation due to:  patient has PMH of stroke (cerebellar), HLD, depression, cancer, and multiple spinal surgeries with pain variances.     Clinical Decision Making  High    Rehab Potential  Fair    Clinical Impairments Affecting Rehab Potential  (+) workouts in water 3 days  /week, motivation (-) age, unstable pain levels     PT Frequency  2x / week    PT Duration  4 weeks    PT Treatment/Interventions  ADLs/Self Care Home Management;Aquatic Therapy;Biofeedback;Cryotherapy;Ultrasound;Traction;Moist Heat;Electrical Stimulation;DME Instruction;Gait training;Stair training;Functional mobility training;Neuromuscular re-education;Balance training;Therapeutic exercise;Therapeutic activities;Patient/family education;Manual techniques;Dry needling;Passive range of motion;Energy conservation;Taping    PT Next Visit Plan  review HEP, static and dynamic balance, stairs    PT Home Exercise Plan  see sheet    Recommended Other Services  n/a    Consulted and Agree with Plan of Care  Patient       Patient will benefit from skilled therapeutic intervention in order to improve the following deficits and impairments:  Abnormal gait, Decreased activity tolerance, Decreased balance, Decreased endurance, Decreased coordination, Decreased knowledge of use of DME, Decreased mobility, Decreased range of motion, Difficulty walking, Decreased strength, Impaired flexibility, Impaired perceived functional ability, Impaired sensation, Postural dysfunction, Improper body mechanics, Pain  Visit Diagnosis: Other abnormalities of gait and mobility  History of falling     Problem List Patient Active Problem List   Diagnosis Date Noted  . Post-nasal drip 06/30/2018  . Atherosclerosis of aorta ([place]) 03/31/2018  . Rash 01/16/2018  . Atheromatous plaque 10/18/2017  . Hyperlipidemia 10/18/2017  . Depression, recurrent ([place]) 09/27/2017  . Pulmonary nodules 09/27/2017  . History of stroke 09/12/2017  . Stroke (cerebrum) (HCC) - Patchy small volume acute ischemic nonhemorrhagic superior left cerebellar infarct, left SCA territory, of undetermined etiology, with diffuse atherosclerosis 08/10/2017  . Primary osteoarthritis of left hip 02/28/2017  . Status post total replacement of right hip  04/22/2016  . Personal history of colonic polyps   . Greater trochanteric bursitis of both hips 01/09/2016  . UTI (lower urinary tract infection) 11/22/2014  . Low back pain 11/22/2014  . Spondylolisthesis of lumbar region 11/18/2014  . Lumbar radiculopathy 08/21/2014  . Degeneration of lumbar or lumbosacral intervertebral disc 08/21/2014  . Major depression in remission ([place]) 06/23/2014   Janna Arch, PT, DPT   07/10/2018, 1:20 PM  [place] MAIN [facility] SERVICES [address] [place], [place], [address] Phone: [phone]   Fax:  [phone]  Name: Melody Ochoa MRN: [id number] Date of Birth: 08-05-1941

## 2018-07-10 NOTE — Addendum Note (Signed)
Addended by: Judene Companion on: 07/10/2018 01:23 PM   Modules accepted: Orders

## 2018-07-10 NOTE — Patient Instructions (Signed)
Access Code: [email or web address]  URL: [email or web address]  Date: 07/10/2018  Prepared by: Janna Arch   Exercises  Seated Long Arc Quad - 10 reps - 1 sets - 5 hold - 1x daily - 7x weekly  Sit to Stand with Counter Support - 10 reps - 1 sets - 5 hold - 1x daily - 7x weekly  Seated Hip Abduction with Resistance - 10 reps - 1 sets - 5 hold - 1x daily - 7x weekly  Seated Hip Adduction Isometrics with Ball - 10 reps - 1 sets - 5 hold - 1x daily - 7x weekly  Standing March with Counter Support - 10 reps - 1 sets - 5 hold - 1x daily - 7x weekly

## 2018-07-12 ENCOUNTER — Ambulatory Visit: Payer: Medicare Other

## 2018-07-13 ENCOUNTER — Ambulatory Visit: Payer: Medicare Other

## 2018-07-17 ENCOUNTER — Telehealth: Payer: Self-pay | Admitting: Family

## 2018-07-17 DIAGNOSIS — R0982 Postnasal drip: Secondary | ICD-10-CM

## 2018-07-17 NOTE — Telephone Encounter (Signed)
Copied from [place] [phone]. Topic: Quick Communication - See Telephone Encounter >> Jul 17, 2018  3:09 PM Conception Chancy, NT wrote: CRM for notification. See Telephone encounter for: 07/17/18.  Patient is calling and states she used the over the counter medicine that [name] advised for her to use on 06/30/18. She states she still has a cough. She would like for her to prescribe her something if possible.  [id number] [place] [address] [place] [address] Phone: [phone] Fax: [phone]

## 2018-07-17 NOTE — Telephone Encounter (Signed)
Please advise 

## 2018-07-18 ENCOUNTER — Telehealth: Payer: Self-pay | Admitting: *Deleted

## 2018-07-18 ENCOUNTER — Ambulatory Visit: Payer: Medicare Other

## 2018-07-18 NOTE — Telephone Encounter (Signed)
Spoke with patient spouse per DPR , patient is ok with waiting until tomorrow to get antibiotic called in.

## 2018-07-18 NOTE — Telephone Encounter (Signed)
Patient called.  States she is sorry that she forgot to stop her blood thinner for her procedure on Thursday.  Informed patient that her appointment for Thursday was for a follow up from her last procedure and she did NOT have to stop any blood thinners at this time.  Patient states understanding.

## 2018-07-18 NOTE — Telephone Encounter (Signed)
Melody Ochoa and Melody Ochoa,   Would you call in abx for patient?   Using app and cannot see renal function, allergies etc.  Or you can ask pt if she would wait on my return tomorrow .

## 2018-07-18 NOTE — Telephone Encounter (Signed)
Spoke with patient states she thinks cough has moved in to chest , not deep chest cold  feels like has  tickle in throat, no fever , no wheezing. Would like  Antibiotic.  Has been using OTC, Delsym, and Flonase.  Offered appointment declined

## 2018-07-19 ENCOUNTER — Telehealth: Payer: Self-pay | Admitting: Cardiology

## 2018-07-19 ENCOUNTER — Ambulatory Visit: Payer: Medicare Other

## 2018-07-19 ENCOUNTER — Encounter: Payer: Self-pay | Admitting: Family

## 2018-07-19 DIAGNOSIS — F325 Major depressive disorder, single episode, in full remission: Secondary | ICD-10-CM | POA: Diagnosis not present

## 2018-07-19 MED ORDER — CEFDINIR 300 MG PO CAPS: 300.0000 mg | ORAL_CAPSULE | Freq: Two times a day (BID) | ORAL | 0 refills | Status: DC

## 2018-07-19 NOTE — Telephone Encounter (Signed)
FYI

## 2018-07-19 NOTE — Telephone Encounter (Signed)
LMOVM requesting that pt send manual transmission b/c home monitor has not updated in at least 14 days.    

## 2018-07-19 NOTE — Telephone Encounter (Signed)
Left voice mail for patient to call back ok for PEC to speak to patient , please advise of below

## 2018-07-19 NOTE — Telephone Encounter (Signed)
Noted  

## 2018-07-19 NOTE — Telephone Encounter (Signed)
Patient called back and verbalized understanding of [facility] and to call back sometime before she finishes the medication for a f/u with arnett.

## 2018-07-19 NOTE — Telephone Encounter (Signed)
  Call pt  We will start cefdinir; ensure probiotics.  Let us know if any worsening sxs  Since cough has been longer than 2 months, please schedule f/u appt as I want to ensure cough improves. If not, we may need consider other etiology or evaluation with pulmonary due to her smoking history

## 2018-07-20 ENCOUNTER — Other Ambulatory Visit: Payer: Self-pay

## 2018-07-20 ENCOUNTER — Ambulatory Visit
Payer: Medicare Other | Attending: Student in an Organized Health Care Education/Training Program | Admitting: Student in an Organized Health Care Education/Training Program

## 2018-07-20 ENCOUNTER — Encounter: Payer: Self-pay | Admitting: Student in an Organized Health Care Education/Training Program

## 2018-07-20 VITALS — BP 129/75 | HR 84 | Temp 98.2°F | Resp 18 | Ht 64.0 in | Wt 192.0 lb

## 2018-07-20 DIAGNOSIS — Z87891 Personal history of nicotine dependence: Secondary | ICD-10-CM | POA: Insufficient documentation

## 2018-07-20 DIAGNOSIS — Z79899 Other long term (current) drug therapy: Secondary | ICD-10-CM | POA: Insufficient documentation

## 2018-07-20 DIAGNOSIS — M5416 Radiculopathy, lumbar region: Secondary | ICD-10-CM | POA: Insufficient documentation

## 2018-07-20 DIAGNOSIS — E785 Hyperlipidemia, unspecified: Secondary | ICD-10-CM | POA: Diagnosis not present

## 2018-07-20 DIAGNOSIS — M1612 Unilateral primary osteoarthritis, left hip: Secondary | ICD-10-CM | POA: Diagnosis not present

## 2018-07-20 DIAGNOSIS — M961 Postlaminectomy syndrome, not elsewhere classified: Secondary | ICD-10-CM | POA: Diagnosis not present

## 2018-07-20 DIAGNOSIS — Z5181 Encounter for therapeutic drug level monitoring: Secondary | ICD-10-CM | POA: Diagnosis not present

## 2018-07-20 DIAGNOSIS — I700 Atherosclerosis of aorta: Secondary | ICD-10-CM | POA: Diagnosis not present

## 2018-07-20 DIAGNOSIS — K219 Gastro-esophageal reflux disease without esophagitis: Secondary | ICD-10-CM | POA: Insufficient documentation

## 2018-07-20 DIAGNOSIS — G894 Chronic pain syndrome: Secondary | ICD-10-CM

## 2018-07-20 DIAGNOSIS — M5137 Other intervertebral disc degeneration, lumbosacral region: Secondary | ICD-10-CM | POA: Insufficient documentation

## 2018-07-20 DIAGNOSIS — M4316 Spondylolisthesis, lumbar region: Secondary | ICD-10-CM | POA: Diagnosis not present

## 2018-07-20 DIAGNOSIS — M545 Low back pain: Secondary | ICD-10-CM | POA: Diagnosis not present

## 2018-07-20 DIAGNOSIS — Z9889 Other specified postprocedural states: Secondary | ICD-10-CM

## 2018-07-20 DIAGNOSIS — F329 Major depressive disorder, single episode, unspecified: Secondary | ICD-10-CM | POA: Insufficient documentation

## 2018-07-20 DIAGNOSIS — Z8673 Personal history of transient ischemic attack (TIA), and cerebral infarction without residual deficits: Secondary | ICD-10-CM | POA: Diagnosis not present

## 2018-07-20 MED ORDER — OXYCODONE HCL 5 MG PO TABS: 2.5000 mg | ORAL_TABLET | Freq: Two times a day (BID) | ORAL | 0 refills | Status: DC | PRN

## 2018-07-20 NOTE — Patient Instructions (Addendum)
Rx for Oxycodone to last until 08/19/2018 has been escribed to your pharmacy.  Stop taking Plavix 7 days prior to procedure.   Moderate Conscious Sedation, Adult Sedation is the use of medicines to promote relaxation and relieve discomfort and anxiety. Moderate conscious sedation is a type of sedation. Under moderate conscious sedation, you are less alert than normal, but you are still able to respond to instructions, touch, or both. Moderate conscious sedation is used during short medical and dental procedures. It is milder than deep sedation, which is a type of sedation under which you cannot be easily woken up. It is also milder than general anesthesia, which is the use of medicines to make you unconscious. Moderate conscious sedation allows you to return to your regular activities sooner. Tell a health care provider about:  Any allergies you have.  All medicines you are taking, including vitamins, herbs, eye drops, creams, and over-the-counter medicines.  Use of steroids (by mouth or creams).  Any problems you or family members have had with sedatives and anesthetic medicines.  Any blood disorders you have.  Any surgeries you have had.  Any medical conditions you have, such as sleep apnea.  Whether you are pregnant or may be pregnant.  Any use of cigarettes, alcohol, marijuana, or street drugs. What are the risks? Generally, this is a safe procedure. However, problems may occur, including:  Getting too much medicine (oversedation).  Nausea.  Allergic reaction to medicines.  Trouble breathing. If this happens, a breathing tube may be used to help with breathing. It will be removed when you are awake and breathing on your own.  Heart trouble.  Lung trouble.  What happens before the procedure? Staying hydrated Follow instructions from your health care provider about hydration, which may include:  Up to 2 hours before the procedure - you may continue to drink clear  liquids, such as water, clear fruit juice, black coffee, and plain tea.  Eating and drinking restrictions Follow instructions from your health care provider about eating and drinking, which may include:  8 hours before the procedure - stop eating heavy meals or foods such as meat, fried foods, or fatty foods.  6 hours before the procedure - stop eating light meals or foods, such as toast or cereal.  6 hours before the procedure - stop drinking milk or drinks that contain milk.  2 hours before the procedure - stop drinking clear liquids.  Medicine  Ask your health care provider about:  Changing or stopping your regular medicines. This is especially important if you are taking diabetes medicines or blood thinners.  Taking medicines such as aspirin and ibuprofen. These medicines can thin your blood. Do not take these medicines before your procedure if your health care provider instructs you not to.  Tests and exams  You will have a physical exam.  You may have blood tests done to show: ? How well your kidneys and liver are working. ? How well your blood can clot. General instructions  Plan to have someone take you home from the hospital or clinic.  If you will be going home right after the procedure, plan to have someone with you for 24 hours. What happens during the procedure?  An IV tube will be inserted into one of your veins.  Medicine to help you relax (sedative) will be given through the IV tube.  The medical or dental procedure will be performed. What happens after the procedure?  Your blood pressure, heart rate, breathing rate,  and blood oxygen level will be monitored often until the medicines you were given have worn off.  Do not drive for 24 hours. This information is not intended to replace advice given to you by your health care provider. Make sure you discuss any questions you have with your health care provider. Document Released: 08/17/2001 Document Revised:  04/27/2016 Document Reviewed: 03/13/2016 Elsevier Interactive Patient Education  2018 [place]  What are the risk, side effects and possible complications? Generally speaking, most procedures are safe.  However, with any procedure there are risks, side effects, and the possibility of complications.  The risks and complications are dependent upon the sites that are lesioned, or the type of nerve block to be performed.  The closer the procedure is to the spine, the more serious the risks are.  Great care is taken when placing the radio frequency needles, block needles or lesioning probes, but sometimes complications can occur. 1. Infection: Any time there is an injection through the skin, there is a risk of infection.  This is why sterile conditions are used for these blocks.  There are four possible types of infection. 1. Localized skin infection. 2. Central Nervous System Infection-This can be in the form of Meningitis, which can be deadly. 3. Epidural Infections-This can be in the form of an epidural abscess, which can cause pressure inside of the spine, causing compression of the spinal cord with subsequent paralysis. This would require an emergency surgery to decompress, and there are no guarantees that the patient would recover from the paralysis. 4. Discitis-This is an infection of the intervertebral discs.  It occurs in about 1% of discography procedures.  It is difficult to treat and it may lead to surgery.        2. Pain: the needles have to go through skin and soft tissues, will cause soreness.       3. Damage to internal structures:  The nerves to be lesioned may be near blood vessels or    other nerves which can be potentially damaged.       4. Bleeding: Bleeding is more common if the patient is taking blood thinners such as  aspirin, Coumadin, Ticiid, Plavix, etc., or if he/she have some genetic predisposition  such as hemophilia. Bleeding into the  spinal canal can cause compression of the spinal  cord with subsequent paralysis.  This would require an emergency surgery to  decompress and there are no guarantees that the patient would recover from the  paralysis.       5. Pneumothorax:  Puncturing of a lung is a possibility, every time a needle is introduced in  the area of the chest or upper back.  Pneumothorax refers to free air around the  collapsed lung(s), inside of the thoracic cavity (chest cavity).  Another two possible  complications related to a similar event would include: Hemothorax and Chylothorax.   These are variations of the Pneumothorax, where instead of air around the collapsed  lung(s), you may have blood or chyle, respectively.       6. Spinal headaches: They may occur with any procedures in the area of the spine.       7. Persistent CSF (Cerebro-Spinal Fluid) leakage: This is a rare problem, but may occur  with prolonged intrathecal or epidural catheters either due to the formation of a fistulous  track or a dural tear.       8. Nerve damage: By working so  close to the spinal cord, there is always a possibility of  nerve damage, which could be as serious as a permanent spinal cord injury with  paralysis.       9. Death:  Although rare, severe deadly allergic reactions known as "Anaphylactic  reaction" can occur to any of the medications used.      10. Worsening of the symptoms:  We can always make thing worse.  What are the chances of something like this happening? Chances of any of this occuring are extremely low.  By statistics, you have more of a chance of getting killed in a motor vehicle accident: while driving to the hospital than any of the above occurring .  Nevertheless, you should be aware that they are possibilities.  In general, it is similar to taking a shower.  Everybody knows that you can slip, hit your head and get killed.  Does that mean that you should not shower again?  Nevertheless always keep in mind that  statistics do not mean anything if you happen to be on the wrong side of them.  Even if a procedure has a 1 (one) in a 1,000,000 (million) chance of going wrong, it you happen to be that one..Also, keep in mind that by statistics, you have more of a chance of having something go wrong when taking medications.  Who should not have this procedure? If you are on a blood thinning medication (e.g. Coumadin, Plavix, see list of "Blood Thinners"), or if you have an active infection going on, you should not have the procedure.  If you are taking any blood thinners, please inform your physician.  How should I prepare for this procedure?  Do not eat or drink anything at least six hours prior to the procedure.  Bring a driver with you .  It cannot be a taxi.  Come accompanied by an adult that can drive you back, and that is strong enough to help you if your legs get weak or numb from the local anesthetic.  Take all of your medicines the morning of the procedure with just enough water to swallow them.  If you have diabetes, make sure that you are scheduled to have your procedure done first thing in the morning, whenever possible.  If you have diabetes, take only half of your insulin dose and notify our nurse that you have done so as soon as you arrive at the clinic.  If you are diabetic, but only take blood sugar pills (oral hypoglycemic), then do not take them on the morning of your procedure.  You may take them after you have had the procedure.  Do not take aspirin or any aspirin-containing medications, at least eleven (11) days prior to the procedure.  They may prolong bleeding.  Wear loose fitting clothing that may be easy to take off and that you would not mind if it got stained with Betadine or blood.  Do not wear any jewelry or perfume  Remove any nail coloring.  It will interfere with some of our monitoring equipment.  NOTE: Remember that this is not meant to be interpreted as a complete  list of all possible complications.  Unforeseen problems may occur.  BLOOD THINNERS The following drugs contain aspirin or other products, which can cause increased bleeding during surgery and should not be taken for 2 weeks prior to and 1 week after surgery.  If you should need take something for relief of minor pain, you may take acetaminophen which  is found in Tylenol,m Datril, Anacin-3 and Panadol. It is not blood thinner. The products listed below are.  Do not take any of the products listed below in addition to any listed on your instruction sheet.  A.P.C or A.P.C with Codeine Codeine Phosphate Capsules #3 Ibuprofen Ridaura  ABC compound Congesprin Imuran rimadil  Advil Cope Indocin Robaxisal  Alka-Seltzer Effervescent Pain Reliever and Antacid Coricidin or Coricidin-D  Indomethacin Rufen  Alka-Seltzer plus Cold Medicine Cosprin Ketoprofen S-A-C Tablets  Anacin Analgesic Tablets or Capsules Coumadin Korlgesic Salflex  Anacin Extra Strength Analgesic tablets or capsules CP-2 Tablets Lanoril Salicylate  Anaprox Cuprimine Capsules Levenox Salocol  Anexsia-D Dalteparin Magan Salsalate  Anodynos Darvon compound Magnesium Salicylate Sine-off  Ansaid Dasin Capsules Magsal Sodium Salicylate  Anturane Depen Capsules Marnal Soma  APF Arthritis pain formula Dewitt's Pills Measurin Stanback  Argesic Dia-Gesic Meclofenamic Sulfinpyrazone  Arthritis Bayer Timed Release Aspirin Diclofenac Meclomen Sulindac  Arthritis pain formula Anacin Dicumarol Medipren Supac  Analgesic (Safety coated) Arthralgen Diffunasal Mefanamic Suprofen  Arthritis Strength Bufferin Dihydrocodeine Mepro Compound Suprol  Arthropan liquid Dopirydamole Methcarbomol with Aspirin Synalgos  ASA tablets/Enseals Disalcid Micrainin Tagament  Ascriptin Doan's Midol Talwin  Ascriptin A/D Dolene Mobidin Tanderil  Ascriptin Extra Strength Dolobid Moblgesic Ticlid  Ascriptin with Codeine Doloprin or Doloprin with Codeine Momentum  Tolectin  Asperbuf Duoprin Mono-gesic Trendar  Aspergum Duradyne Motrin or Motrin IB Triminicin  Aspirin plain, buffered or enteric coated Durasal Myochrisine Trigesic  Aspirin Suppositories Easprin Nalfon Trillsate  Aspirin with Codeine Ecotrin Regular or Extra Strength Naprosyn Uracel  Atromid-S Efficin Naproxen Ursinus  Auranofin Capsules Elmiron Neocylate Vanquish  Axotal Emagrin Norgesic Verin  Azathioprine Empirin or Empirin with Codeine Normiflo Vitamin E  Azolid Emprazil Nuprin Voltaren  Bayer Aspirin plain, buffered or children's or timed BC Tablets or powders Encaprin Orgaran Warfarin Sodium  Buff-a-Comp Enoxaparin Orudis Zorpin  Buff-a-Comp with Codeine Equegesic Os-Cal-Gesic   Buffaprin Excedrin plain, buffered or Extra Strength Oxalid   Bufferin Arthritis Strength Feldene Oxphenbutazone   Bufferin plain or Extra Strength Feldene Capsules Oxycodone with Aspirin   Bufferin with Codeine Fenoprofen Fenoprofen Pabalate or Pabalate-SF   Buffets II Flogesic Panagesic   Buffinol plain or Extra Strength Florinal or Florinal with Codeine Panwarfarin   Buf-Tabs Flurbiprofen Penicillamine   Butalbital Compound Four-way cold tablets Penicillin   Butazolidin Fragmin Pepto-Bismol   Carbenicillin Geminisyn Percodan   Carna Arthritis Reliever Geopen Persantine   Carprofen Gold's salt Persistin   Chloramphenicol Goody's Phenylbutazone   Chloromycetin Haltrain Piroxlcam   Clmetidine heparin Plaquenil   Cllnoril Hyco-pap Ponstel   Clofibrate Hydroxy chloroquine Propoxyphen         Before stopping any of these medications, be sure to consult the physician who ordered them.  Some, such as Coumadin (Warfarin) are ordered to prevent or treat serious conditions such as "deep thrombosis", "pumonary embolisms", and other heart problems.  The amount of time that you may need off of the medication may also vary with the medication and the reason for which you were taking it.  If you are taking any  of these medications, please make sure you notify your pain physician before you undergo any procedures.         Epidural Steroid Injection Patient Information  Description: The epidural space surrounds the nerves as they exit the spinal cord.  In some patients, the nerves can be compressed and inflamed by a bulging disc or a tight spinal canal (spinal stenosis).  By injecting steroids into the epidural space, we  can bring irritated nerves into direct contact with a potentially helpful medication.  These steroids act directly on the irritated nerves and can reduce swelling and inflammation which often leads to decreased pain.  Epidural steroids may be injected anywhere along the spine and from the neck to the low back depending upon the location of your pain.   After numbing the skin with local anesthetic (like Novocaine), a small needle is passed into the epidural space slowly.  You may experience a sensation of pressure while this is being done.  The entire block usually last less than 10 minutes.  Conditions which may be treated by epidural steroids:   Low back and leg pain  Neck and arm pain  Spinal stenosis  Post-laminectomy syndrome  Herpes zoster (shingles) pain  Pain from compression fractures  Preparation for the injection:  1. Do not eat any solid food or dairy products within 8 hours of your appointment.  2. You may drink clear liquids up to 3 hours before appointment.  Clear liquids include water, black coffee, juice or soda.  No milk or cream please. 3. You may take your regular medication, including pain medications, with a sip of water before your appointment  Diabetics should hold regular insulin (if taken separately) and take 1/2 normal NPH dos the morning of the procedure.  Carry some sugar containing items with you to your appointment. 4. A driver must accompany you and be prepared to drive you home after your procedure.  5. Bring all your current medications  with your. 6. An IV may be inserted and sedation may be given at the discretion of the physician.   7. A blood pressure cuff, EKG and other monitors will often be applied during the procedure.  Some patients may need to have extra oxygen administered for a short period. 8. You will be asked to provide medical information, including your allergies, prior to the procedure.  We must know immediately if you are taking blood thinners (like Coumadin/Warfarin)  Or if you are allergic to IV iodine contrast (dye). We must know if you could possible be pregnant.  Possible side-effects:  Bleeding from needle site  Infection (rare, may require surgery)  Nerve injury (rare)  Numbness & tingling (temporary)  Difficulty urinating (rare, temporary)  Spinal headache ( a headache worse with upright posture)  Light -headedness (temporary)  Pain at injection site (several days)  Decreased blood pressure (temporary)  Weakness in arm/leg (temporary)  Pressure sensation in back/neck (temporary)  Call if you experience:  Fever/chills associated with headache or increased back/neck pain.  Headache worsened by an upright position.  New onset weakness or numbness of an extremity below the injection site  Hives or difficulty breathing (go to the emergency room)  Inflammation or drainage at the infection site  Severe back/neck pain  Any new symptoms which are concerning to you  Please note:  Although the local anesthetic injected can often make your back or neck feel good for several hours after the injection, the pain will likely return.  It takes 3-7 days for steroids to work in the epidural space.  You may not notice any pain relief for at least that one week.  If effective, we will often do a series of three injections spaced 3-6 weeks apart to maximally decrease your pain.  After the initial series, we generally will wait several months before considering a repeat injection of the same  type.  If you have any questions, please call [phone]  [facility]

## 2018-07-20 NOTE — Progress Notes (Signed)
Nursing Pain Medication Assessment:  Safety precautions to be maintained throughout the outpatient stay will include: orient to surroundings, keep bed in low position, maintain call bell within reach at all times, provide assistance with transfer out of bed and ambulation.  Medication Inspection Compliance: Pill count conducted under aseptic conditions, in front of the patient. Neither the pills nor the bottle was removed from the patient's sight at any time. Once count was completed pills were immediately returned to the patient in their original bottle.  Medication: Hydrocodone/APAP Pill/Patch Count: 5.5 of 60 pills remain Pill/Patch Appearance: Markings consistent with prescribed medication Bottle Appearance: Standard pharmacy container. Clearly labeled. Filled Date: 05 / 29 / 2019 Last Medication intake:  Day before yesterday

## 2018-07-20 NOTE — Progress Notes (Signed)
Patient's Name: Melody Ochoa  MRN: [id number]  Referring Provider: Burnard Hawthorne, FNP  DOB: 05-Nov-1941  PCP: Burnard Hawthorne, FNP  DOS: 07/20/2018  Note by: Gillis Santa, MD  Service setting: Ambulatory outpatient  Specialty: Interventional Pain Management  Location: [facility] (AMB) Pain Management Facility    Patient type: Established   Primary Reason(s) for Visit: Encounter for prescription drug management & post-procedure evaluation of chronic illness with mild to moderate exacerbation(Level of risk: moderate) CC: Back Pain and Hip Pain (left)  HPI  Melody Ochoa is a 77 y.o. year old, female patient, who comes today for a post-procedure evaluation and medication management. She has Spondylolisthesis of lumbar region; UTI (lower urinary tract infection); Low back pain; Personal history of colonic polyps; Status post total replacement of right hip; Stroke (cerebrum) (HCC) - Patchy small volume acute ischemic nonhemorrhagic superior left cerebellar infarct, left SCA territory, of undetermined etiology, with diffuse atherosclerosis; Depression, recurrent ([place]); Pulmonary nodules; Atheromatous plaque; Hyperlipidemia; Rash; Lumbar radiculopathy; History of stroke; Greater trochanteric bursitis of both hips; Degeneration of lumbar or lumbosacral intervertebral disc; Major depression in remission ([place]); Primary osteoarthritis of left hip; Atherosclerosis of aorta (HCC); and Post-nasal drip on their problem list. Her primarily concern today is the Back Pain and Hip Pain (left)  Pain Assessment: Location: Lower Back Radiating: radiates down to hip and into left groin Onset: More than a month ago Duration: Chronic pain Quality: Aching, Constant Severity: 7 /10 (subjective, self-reported pain score)  Note: Reported level is inconsistent with clinical observations. Clinically the patient looks like a 3/10 A 3/10 is viewed as "Moderate" and described as significantly interfering with activities of daily  living (ADL). It becomes difficult to feed, bathe, get dressed, get on and off the toilet or to perform personal hygiene functions. Difficult to get in and out of bed or a chair without assistance. Very distracting. With effort, it can be ignored when deeply involved in activities. Information on the proper use of the pain scale provided to the patient today. When using our objective Pain Scale, levels between 6 and 10/10 are said to belong in an emergency room, as it progressively worsens from a 6/10, described as severely limiting, requiring emergency care not usually available at an outpatient pain management facility. At a 6/10 level, communication becomes difficult and requires great effort. Assistance to reach the emergency department may be required. Facial flushing and profuse sweating along with potentially dangerous increases in heart rate and blood pressure will be evident. Effect on ADL: "It makes activity difficult" Timing: Constant(with movement) Modifying factors: rest, medication BP: 129/75  HR: 84  Melody Ochoa was last seen on 07/18/2018 for a procedure. During today's appointment we reviewed Melody Ochoa's post-procedure results, as well as her outpatient medication regimen.  Patient follows up.  States that the lumbar epidural steroid injections are helpful for approximately 3 to 4 weeks for her left-sided radicular pain.  Stating that the hydrocodone is making her nauseous although it is helpful.  She has tried taking this on a full stomach and also taking over-the-counter Nexium which is not been helpful.  Wants to try another medication.  She states that she has had suicidal thoughts with gabapentin in the past and does not want to try Lyrica.  She is currently on Cymbalta 60 mg twice daily.  Want to avoid NSAID therapy and her at this age.  Especially while she is on Plavix.  Further details on both, my assessment(s), as well as the proposed  treatment plan, please see  below.  Controlled Substance Pharmacotherapy Assessment REMS (Risk Evaluation and Mitigation Strategy)  Analgesic: Hydrocodone 5 mg daily to twice daily as needed MME/day: Less than 10 mg/day.  Dewayne Shorter, RN  07/20/2018 10:13 AM  Signed Nursing Pain Medication Assessment:  Safety precautions to be maintained throughout the outpatient stay will include: orient to surroundings, keep bed in low position, maintain call bell within reach at all times, provide assistance with transfer out of bed and ambulation.  Medication Inspection Compliance: Pill count conducted under aseptic conditions, in front of the patient. Neither the pills nor the bottle was removed from the patient's sight at any time. Once count was completed pills were immediately returned to the patient in their original bottle.  Medication: Hydrocodone/APAP Pill/Patch Count: 5.5 of 60 pills remain Pill/Patch Appearance: Markings consistent with prescribed medication Bottle Appearance: Standard pharmacy container. Clearly labeled. Filled Date: 05 / 29 / 2019 Last Medication intake:  Day before yesterday   Pharmacokinetics: Liberation and absorption (onset of action): WNL Distribution (time to peak effect): WNL Metabolism and excretion (duration of action): WNL         Pharmacodynamics: Desired effects: Analgesia: Melody Ochoa reports >50% benefit. Functional ability: Patient reports that medication allows her to accomplish basic ADLs Clinically meaningful improvement in function (CMIF): Sustained CMIF goals met Perceived effectiveness: Described as relatively effective but with some room for improvement Undesirable effects: Side-effects or Adverse reactions: None reported Monitoring:  PMP: Online review of the past 81-monthperiod conducted. Compliant with practice rules and regulations Last UDS on record: Summary  Date Value Ref Range Status  02/15/2018 FINAL  Final    Comment:     ==================================================================== [facility] COMP DRUG ANALYSIS,UR ==================================================================== Test                             Result       Flag       Units Drug Present and Declared for Prescription Verification   Gabapentin                     PRESENT      EXPECTED   Bupropion                      PRESENT      EXPECTED   Hydroxybupropion               PRESENT      EXPECTED    Hydroxybupropion is an expected metabolite of bupropion.   Duloxetine                     PRESENT      EXPECTED ==================================================================== Test                      Result    Flag   Units      Ref Range   Creatinine              90               mg/dL      >=20 ==================================================================== Declared Medications:  The flagging and interpretation on this report are based on the  following declared medications.  Unexpected results may arise from  inaccuracies in the declared medications.  **Note: The testing scope of this panel includes these medications:  Bupropion (Wellbutrin)  Duloxetine (Cymbalta)  Gabapentin  **  Note: The testing scope of this panel does not include following  reported medications:  Atorvastatin (Lipitor)  Calcium carbonate (Calcium carbonate/Vitamin D)  Clopidogrel (Plavix)  Cyanocobalamin  Iron (Ferrous Sulfate)  Mupirocin (Bactroban)  Triamcinolone (Kenalog)  Vitamin C  Vitamin D (Calcium carbonate/Vitamin D) ==================================================================== For clinical consultation, please call [phone]. ====================================================================    UDS interpretation: Compliant          Medication Assessment Form: Reviewed. Patient indicates being compliant with therapy Treatment compliance: Compliant Risk Assessment Profile: Aberrant behavior: See prior evaluations. None  observed or detected today Comorbid factors increasing risk of overdose: See prior notes. No additional risks detected today Opioid risk tool (ORT) (Total Score): 2 Personal History of Substance Abuse (SUD-Substance use disorder):  Alcohol: Negative  Illegal Drugs: Negative  Rx Drugs: Negative  ORT Risk Level calculation: Low Risk Risk of substance use disorder (SUD): Low Opioid Risk Tool - 07/20/18 1010      Family History of Substance Abuse   Alcohol  Positive Female    Illegal Drugs  Negative    Rx Drugs  Negative      Personal History of Substance Abuse   Alcohol  Negative    Illegal Drugs  Negative    Rx Drugs  Negative      Age   Age between 34-45 years   No      History of Preadolescent Sexual Abuse   History of Preadolescent Sexual Abuse  Negative or Female      Psychological Disease   Psychological Disease  Negative    Depression  Positive      Total Score   Opioid Risk Tool Scoring  2    Opioid Risk Interpretation  Low Risk      ORT Scoring interpretation table:  Score <3 = Low Risk for SUD  Score between 4-7 = Moderate Risk for SUD  Score >8 = High Risk for Opioid Abuse   Risk Mitigation Strategies:  Patient Counseling: Covered Patient-Prescriber Agreement (PPA): Present and active  Notification to other healthcare providers: Done  Pharmacologic Plan: Due to nausea with hydrocodone, will trial oxycodone 2.5 mg daily as needed              Post-Procedure Assessment  06/22/2018 Procedure: Left L2-L3 ESI Pre-procedure pain score:  9/10 Post-procedure pain score: 8/10         Influential Factors: BMI: 32.96 kg/m Intra-procedural challenges: None observed.         Assessment challenges: None detected.              Reported side-effects: None.        Post-procedural adverse reactions or complications: None reported         Sedation: Please see nurses note. When no sedatives are used, the analgesic levels obtained are directly associated to the  effectiveness of the local anesthetics. However, when sedation is provided, the level of analgesia obtained during the initial 1 hour following the intervention, is believed to be the result of a combination of factors. These factors may include, but are not limited to: 1. The effectiveness of the local anesthetics used. 2. The effects of the analgesic(s) and/or anxiolytic(s) used. 3. The degree of discomfort experienced by the patient at the time of the procedure. 4. The patients ability and reliability in recalling and recording the events. 5. The presence and influence of possible secondary gains and/or psychosocial factors. Reported result: Relief experienced during the 1st hour after the procedure: 100 % (Ultra-Short Term  Relief)            Interpretative annotation: Clinically appropriate result. Analgesia during this period is likely to be Local Anesthetic and/or IV Sedative (Analgesic/Anxiolytic) related.          Effects of local anesthetic: The analgesic effects attained during this period are directly associated to the localized infiltration of local anesthetics and therefore cary significant diagnostic value as to the etiological location, or anatomical origin, of the pain. Expected duration of relief is directly dependent on the pharmacodynamics of the local anesthetic used. Long-acting (4-6 hours) anesthetics used.  Reported result: Relief during the next 4 to 6 hour after the procedure: 100 % (Short-Term Relief)            Interpretative annotation: Clinically appropriate result. Analgesia during this period is likely to be Local Anesthetic-related.          Long-term benefit: Defined as the period of time past the expected duration of local anesthetics (1 hour for short-acting and 4-6 hours for long-acting). With the possible exception of prolonged sympathetic blockade from the local anesthetics, benefits during this period are typically attributed to, or associated with, other factors  such as analgesic sensory neuropraxia, antiinflammatory effects, or beneficial biochemical changes provided by agents other than the local anesthetics.  Reported result: Extended relief following procedure: 100 %(lasting 2 weeks) (Long-Term Relief)            Interpretative annotation: Clinically possible results. Complete relief. Therapeutic success. Inflammation plays a part in the etiology to the pain.          Current benefits: Defined as reported results that persistent at this point in time.   Analgesia: 50-75 %            Function: Somewhat improved ROM: Somewhat improved Interpretative annotation: Recurrence of symptoms. Therapeutic benefit observed. Effective diagnostic intervention.          Interpretation: Results would suggest a successful diagnostic and therapeutic intervention.                  Plan:  Repeat treatment or therapy and compare extent and duration of benefits.                Laboratory Chemistry  Inflammation Markers (CRP: Acute Phase) (ESR: Chronic Phase) No results found for: CRP, ESRSEDRATE, LATICACIDVEN                       Rheumatology Markers No results found for: RF, ANA, LABURIC, URICUR, LYMEIGGIGMAB, LYMEABIGMQN, HLAB27                      Renal Function Markers Lab Results  Component Value Date   BUN 12 04/17/2018   CREATININE 0.84 04/17/2018   GFRAA >60 04/17/2018   GFRNONAA >60 04/17/2018                             Hepatic Function Markers Lab Results  Component Value Date   AST 19 04/17/2018   ALT 17 04/17/2018   ALBUMIN 4.2 04/17/2018   ALKPHOS 64 04/17/2018                        Electrolytes Lab Results  Component Value Date   NA 135 04/17/2018   K 3.7 04/17/2018   CL 99 (L) 04/17/2018   CALCIUM 8.8 (L) 04/17/2018  Neuropathy Markers Lab Results  Component Value Date   VITAMINB12 334 11/23/2014   FOLATE >20.0 11/23/2014   HGBA1C 5.3 08/10/2017                        Bone Pathology Markers No  results found for: VD25OH, YQ034VQ2VZD, GL8756EP3, IR5188CZ6, 25OHVITD1, 25OHVITD2, 25OHVITD3, TESTOFREE, TESTOSTERONE                       Coagulation Parameters Lab Results  Component Value Date   INR 0.91 08/09/2017   LABPROT 12.2 08/09/2017   APTT 27 08/09/2017   PLT 299 04/17/2018                        Cardiovascular Markers Lab Results  Component Value Date   TROPONINI <0.03 08/19/2017   HGB 11.6 (L) 04/17/2018   HCT 34.3 (L) 04/17/2018                         CA Markers No results found for: CEA, CA125, LABCA2                      Note: Lab results reviewed.  Recent Diagnostic Imaging Results  CUP PACEART REMOTE DEVICE CHECK Carelink summary report received. Battery status OK. Normal device function. No new symptom episodes, tachy episodes, brady, or pause episodes. No new AF episodes. Monthly summary reports and ROV with SK/B PRN.Levander Campion BSN, RN, CCDS  Complexity Note: Imaging results reviewed. Results shared with Ms. Tecson, using Layman's terms.                         Meds   Current Outpatient Medications:  .  acetaminophen (TYLENOL) 500 MG tablet, Take 2 tablets (1,000 mg total) by mouth every 8 (eight) hours as needed for mild pain or moderate pain., Disp: 20 tablet, Rfl: 2 .  atorvastatin (LIPITOR) 80 MG tablet, Take 1 tablet (80 mg total) daily at 6 PM by mouth., Disp: 90 tablet, Rfl: 3 .  buPROPion (WELLBUTRIN XL) 300 MG 24 hr tablet, Take 300 mg by mouth daily. , Disp: , Rfl:  .  Calcium Carbonate-Vitamin D (CALTRATE 600+D PO), Take 1 tablet by mouth daily., Disp: , Rfl:  .  cefdinir (OMNICEF) 300 MG capsule, Take 1 capsule (300 mg total) by mouth 2 (two) times daily., Disp: 20 capsule, Rfl: 0 .  clopidogrel (PLAVIX) 75 MG tablet, Take 1 tablet (75 mg total) by mouth daily., Disp: 30 tablet, Rfl: 0 .  Cyanocobalamin (VITAMIN B-12 PO), Take 1 tablet by mouth daily., Disp: , Rfl:  .  DULoxetine (CYMBALTA) 60 MG capsule, Take 60 mg by mouth 2 (two)  times daily. , Disp: , Rfl:  .  ferrous sulfate 325 (65 FE) MG tablet, Take 1 tablet (325 mg total) by mouth daily., Disp: 30 tablet, Rfl: 0 .  vitamin C (ASCORBIC ACID) 500 MG tablet, Take 500 mg by mouth daily., Disp: , Rfl:  .  oxyCODONE (OXY IR/ROXICODONE) 5 MG immediate release tablet, Take 0.5 tablets (2.5 mg total) by mouth 2 (two) times daily as needed for severe pain., Disp: 30 tablet, Rfl: 0 .  triamcinolone cream (KENALOG) 0.1 %, Apply 1 application topically as needed., Disp: , Rfl: 0  ROS  Constitutional: Denies any fever or chills Gastrointestinal: No reported hemesis, hematochezia, vomiting, or acute GI distress Musculoskeletal:  Denies any acute onset joint swelling, redness, loss of ROM, or weakness Neurological: No reported episodes of acute onset apraxia, aphasia, dysarthria, agnosia, amnesia, paralysis, loss of coordination, or loss of consciousness  Allergies  Ms. Schneck is allergic to [employer] sodium]; aspirin; celebrex [celecoxib]; effexor [venlafaxine]; gabapentin; hydrocodone-homatropine; ibuprofen; plavix [clopidogrel bisulfate]; tape; vioxx [rofecoxib]; other; and tramadol.  [place]  Drug: Ms. Drolet  reports that she does not use drugs. Alcohol:  reports that she drinks alcohol. Tobacco:  reports that she quit smoking about 21 years ago. Her smoking use included cigarettes. She has a 40.00 pack-year smoking history. She has never used smokeless tobacco. Medical:  has a past medical history of Arthritis, Atheromatous plaque (10/18/2017), Back pain, Cancer ([place]), Depression, Diverticulosis, Dizziness, GERD (gastroesophageal reflux disease), Heart murmur, Hyperlipidemia (10/18/2017), PONV (postoperative nausea and vomiting), and Stroke ([place]). Surgical: Ms. Nugent  has a past surgical history that includes Tonsillectomy; Facelift (89); Cervical disc surgery (8); Back surgery (12); Colonoscopy with propofol (N/A, 01/13/2016); Total hip arthroplasty (Right,  04/22/2016); TEE without cardioversion (N/A, 08/12/2017); LOOP RECORDER INSERTION (N/A, 08/23/2017); Breast excisional biopsy (Left, 90's); and Breast biopsy (Bilateral). Family: family history includes Aneurysm in her brother; Breast cancer (age of onset: 47) in her maternal aunt; Cushing syndrome in her paternal grandmother; Dementia in her paternal grandfather; Diabetes in her maternal grandmother; Kidney disease in her maternal grandfather; Lung cancer in her father; Stroke in her brother.  Constitutional Exam  General appearance: Well nourished, well developed, and well hydrated. In no apparent acute distress Vitals:   07/20/18 1001  BP: 129/75  Pulse: 84  Resp: 18  Temp: 98.2 F (36.8 C)  SpO2: 98%  Weight: 192 lb (87.1 kg)  Height: '5\' 4"'$  (1.626 m)   BMI Assessment: Estimated body mass index is 32.96 kg/m as calculated from the following:   Height as of this encounter: '5\' 4"'$  (1.626 m).   Weight as of this encounter: 192 lb (87.1 kg).  BMI interpretation table: BMI level Category Range association with higher incidence of chronic pain  <18 kg/m2 Underweight   18.5-24.9 kg/m2 Ideal body weight   25-29.9 kg/m2 Overweight Increased incidence by 20%  30-34.9 kg/m2 Obese (Class I) Increased incidence by 68%  35-39.9 kg/m2 Severe obesity (Class II) Increased incidence by 136%  >40 kg/m2 Extreme obesity (Class III) Increased incidence by 254%   Patient's current BMI Ideal Body weight  Body mass index is 32.96 kg/m. Ideal body weight: 54.7 kg (120 lb 9.5 oz) Adjusted ideal body weight: 67.7 kg (149 lb 2.5 oz)   BMI Readings from Last 4 Encounters:  07/20/18 32.96 kg/m  06/30/18 33.51 kg/m  05/18/18 32.96 kg/m  05/15/18 34.16 kg/m   Wt Readings from Last 4 Encounters:  07/20/18 192 lb (87.1 kg)  06/30/18 195 lb 4 oz (88.6 kg)  05/18/18 192 lb (87.1 kg)  05/15/18 199 lb (90.3 kg)  Psych/Mental status: Alert, oriented x 3 (person, place, & time)       Eyes:  PERLA Respiratory: No evidence of acute respiratory distress  Cervical Spine Area Exam  Skin & Axial Inspection: No masses, redness, edema, swelling, or associated skin lesions Alignment: Symmetrical Functional ROM: Unrestricted ROM      Stability: No instability detected Muscle Tone/Strength: Functionally intact. No obvious neuro-muscular anomalies detected. Sensory (Neurological): Unimpaired Palpation: No palpable anomalies              Upper Extremity (UE) Exam    Side: Right upper extremity  Side: Left upper  extremity  Skin & Extremity Inspection: Skin color, temperature, and hair growth are WNL. No peripheral edema or cyanosis. No masses, redness, swelling, asymmetry, or associated skin lesions. No contractures.  Skin & Extremity Inspection: Skin color, temperature, and hair growth are WNL. No peripheral edema or cyanosis. No masses, redness, swelling, asymmetry, or associated skin lesions. No contractures.  Functional ROM: Unrestricted ROM          Functional ROM: Unrestricted ROM          Muscle Tone/Strength: Functionally intact. No obvious neuro-muscular anomalies detected.  Muscle Tone/Strength: Functionally intact. No obvious neuro-muscular anomalies detected.  Sensory (Neurological): Unimpaired          Sensory (Neurological): Unimpaired          Palpation: No palpable anomalies              Palpation: No palpable anomalies              Provocative Test(s):  Phalen's test: deferred Tinel's test: deferred Apley's scratch test (touch opposite shoulder):  Action 1 (Across chest): deferred Action 2 (Overhead): deferred Action 3 (LB reach): deferred   Provocative Test(s):  Phalen's test: deferred Tinel's test: deferred Apley's scratch test (touch opposite shoulder):  Action 1 (Across chest): deferred Action 2 (Overhead): deferred Action 3 (LB reach): deferred    Thoracic Spine Area Exam  Skin & Axial Inspection: No masses, redness, or swelling Alignment:  Symmetrical Functional ROM: Unrestricted ROM Stability: No instability detected Muscle Tone/Strength: Functionally intact. No obvious neuro-muscular anomalies detected. Sensory (Neurological): Unimpaired Muscle strength & Tone: No palpable anomalies  Lumbar Spine Area Exam  Skin & Axial Inspection: Well healed scar from previous spine surgery detected Alignment: Symmetrical Functional ROM: Decreased ROM affecting both sides Stability: No instability detected Muscle Tone/Strength: Functionally intact. No obvious neuro-muscular anomalies detected. Sensory (Neurological): Dermatomal pain pattern and musculoskeletal Palpation: No palpable anomalies       Provocative Tests: Hyperextension/rotation test: (+) due to pain. Lumbar quadrant test (Kemp's test): (+) due to pain. Lateral bending test: (+) ipsilateral radicular pain, bilaterally. Positive for bilateral foraminal stenosis.left greater than right Patrick's Maneuver: deferred today                   FABER test: deferred today                   S-I anterior distraction/compression test: deferred today         S-I lateral compression test: deferred today         S-I Thigh-thrust test: deferred today         S-I Gaenslen's test: deferred today          Gait & Posture Assessment  Ambulation: Limited Gait: Antalgic Posture: WNL   Lower Extremity Exam    Side: Right lower extremity  Side: Left lower extremity  Stability: No instability observed          Stability: No instability observed          Skin & Extremity Inspection: Skin color, temperature, and hair growth are WNL. No peripheral edema or cyanosis. No masses, redness, swelling, asymmetry, or associated skin lesions. No contractures.  Skin & Extremity Inspection: Skin color, temperature, and hair growth are WNL. No peripheral edema or cyanosis. No masses, redness, swelling, asymmetry, or associated skin lesions. No contractures.  Functional ROM: Unrestricted ROM                   Functional ROM: Unrestricted  ROM                  Muscle Tone/Strength: Functionally intact. No obvious neuro-muscular anomalies detected.  Muscle Tone/Strength: Functionally intact. No obvious neuro-muscular anomalies detected.  Sensory (Neurological): Unimpaired  Sensory (Neurological): Unimpaired  Palpation: No palpable anomalies  Palpation: No palpable anomalies   Assessment  Primary Diagnosis & Pertinent Problem List: The primary encounter diagnosis was Lumbar radiculopathy. Diagnoses of Degeneration of lumbar or lumbosacral intervertebral disc, History of lumbar surgery, Chronic pain syndrome, Spondylolisthesis of lumbar region, Failed back surgical syndrome, and Acute low back pain, unspecified back pain laterality, with sciatica presence unspecified were also pertinent to this visit.  Status Diagnosis  Responding Persistent Persistent 1. Lumbar radiculopathy   2. Degeneration of lumbar or lumbosacral intervertebral disc   3. History of lumbar surgery   4. Chronic pain syndrome   5. Spondylolisthesis of lumbar region   6. Failed back surgical syndrome   7. Acute low back pain, unspecified back pain laterality, with sciatica presence unspecified     General Recommendations: The pain condition that the patient suffers from is best treated with a multidisciplinary approach that involves an increase in physical activity to prevent de-conditioning and worsening of the pain cycle, as well as psychological counseling (formal and/or informal) to address the co-morbid psychological affects of pain. Treatment will often involve judicious use of pain medications and interventional procedures to decrease the pain, allowing the patient to participate in the physical activity that will ultimately produce long-lasting pain reductions. The goal of the multidisciplinary approach is to return the patient to a higher level of overall function and to restore their ability to perform activities of daily  living.  77 year old female with a history of posterior L3-L5 spinal fusion and decompression on 12/03/2016 who presents with axial low back pain that is worse on the left that radiates into her left groin and buttock. She denies any pain in her lower extremities. She has minimal right-sided pain. She is status post right hip replacement surgery. Of note patient does have a history of a right TIA for which she is on Plavix 75 mg. She states that she has some minor speech deficit and slight left facial droop as a result of the TIA. Patient finds it difficult to ambulate for an extended period of time.  Patient follows up.  States that the lumbar epidural steroid injections are helpful for approximately 3 to 4 weeks for her left-sided radicular pain.  Stating that the hydrocodone is making her nauseous although it is helpful.  She has tried taking this on a full stomach and also taking over-the-counter Nexium which is not been helpful.  Wants to try another medication.  She states that she has had suicidal thoughts with gabapentin in the past and does not want to try Lyrica.  She is currently on Cymbalta 60 mg twice daily.  Want to avoid NSAID therapy and her at this age.  Especially while she is on Plavix.  Plan: -REPEAT  lumbar epidural steroid injection, left L2/3 (#3) once patient has stopped Plavix 7 days prior to her procedure -Prescription for Oxycodone as below. -Continue cymbalta and heat pack to back   Plan of Care  Pharmacotherapy (Medications Ordered): Meds ordered this encounter  Medications  . oxyCODONE (OXY IR/ROXICODONE) 5 MG immediate release tablet    Sig: Take 0.5 tablets (2.5 mg total) by mouth 2 (two) times daily as needed for severe pain.    Dispense:  30 tablet  Refill:  0    Do not place this medication, or any other prescription from our practice, on "Automatic Refill". Patient may have prescription filled one day early if pharmacy is closed on scheduled refill  date.   Lab-work, procedure(s), and/or referral(s): Orders Placed This Encounter  Procedures  . Lumbar Epidural Injection   Time Note: Greater than 50% of the 25 minute(s) of face-to-face time spent with Ms. Muhlestein, was spent in counseling/coordination of care regarding: Ms. Higginbotham primary cause of pain, the treatment plan, treatment alternatives, the risks and possible complications of proposed treatment, going over the informed consent, the opioid analgesic risks and possible complications, the appropriate use of her medications, realistic expectations, the medication agreement and the patient's responsibilities when it comes to controlled substances.   Provider-requested follow-up: Return in about 2 weeks (around 08/03/2018) for Procedure, Blood Thinner Protocol.  Future Appointments  Date Time Provider [place]  07/25/2018  8:00 AM Janna Arch, PT [facility] None  08/01/2018  8:00 AM Janna Arch, PT [facility] None  08/03/2018  8:00 AM Janna Arch, PT [facility] None  08/08/2018  8:00 AM Janna Arch, PT [facility] None  08/08/2018  9:55 AM [facility] DEVICE REMOTES [facility]  08/10/2018  9:30 AM Janna Arch, PT [facility] None  08/15/2018  8:00 AM Janna Arch, PT [facility] None  08/16/2018 11:00 AM Gillis Santa, MD [facility] None  08/17/2018  9:30 AM Janna Arch, PT [facility] None  08/21/2018 10:30 AM [facility]  08/22/2018  9:45 AM Janna Arch, PT [facility] None  08/24/2018 11:00 AM Janna Arch, PT [facility] None  02/16/2019  2:00 PM O'Brien-Blaney, Bryson Corona, LPN [facility]    Primary Care Physician: Burnard Hawthorne, FNP Location: [facility] Note by: Gillis Santa, M.D Date: 07/20/2018; Time: 10:49 AM  Patient Instructions   Rx for Oxycodone to last until 08/19/2018 has been escribed to your pharmacy.  Stop taking Plavix 7 days prior to procedure.   Moderate Conscious Sedation,  Adult Sedation is the use of medicines to promote relaxation and relieve discomfort and anxiety. Moderate conscious sedation is a type of sedation. Under moderate conscious sedation, you are less alert than normal, but you are still able to respond to instructions, touch, or both. Moderate conscious sedation is used during short medical and dental procedures. It is milder than deep sedation, which is a type of sedation under which you cannot be easily woken up. It is also milder than general anesthesia, which is the use of medicines to make you unconscious. Moderate conscious sedation allows you to return to your regular activities sooner. Tell a health care provider about:  Any allergies you have.  All medicines you are taking, including vitamins, herbs, eye drops, creams, and over-the-counter medicines.  Use of steroids (by mouth or creams).  Any problems you or family members have had with sedatives and anesthetic medicines.  Any blood disorders you have.  Any surgeries you have had.  Any medical conditions you have, such as sleep apnea.  Whether you are pregnant or may be pregnant.  Any use of cigarettes, alcohol, marijuana, or street drugs. What are the risks? Generally, this is a safe procedure. However, problems may occur, including:  Getting too much medicine (oversedation).  Nausea.  Allergic reaction to medicines.  Trouble breathing. If this happens, a breathing tube may be used to help with breathing. It will be removed when you are awake and breathing on your own.  Heart trouble.  Lung trouble.  What happens before the procedure? Staying hydrated Follow instructions from your health care provider about hydration, which may include:  Up to 2 hours before the procedure - you may continue to drink clear liquids, such as water, clear fruit juice, black coffee, and plain tea.  Eating and drinking restrictions Follow instructions from your health care provider about  eating and drinking, which may include:  8 hours before the procedure - stop eating heavy meals or foods such as meat, fried foods, or fatty foods.  6 hours before the procedure - stop eating light meals or foods, such as toast or cereal.  6 hours before the procedure - stop drinking milk or drinks that contain milk.  2 hours before the procedure - stop drinking clear liquids.  Medicine  Ask your health care provider about:  Changing or stopping your regular medicines. This is especially important if you are taking diabetes medicines or blood thinners.  Taking medicines such as aspirin and ibuprofen. These medicines can thin your blood. Do not take these medicines before your procedure if your health care provider instructs you not to.  Tests and exams  You will have a physical exam.  You may have blood tests done to show: ? How well your kidneys and liver are working. ? How well your blood can clot. General instructions  Plan to have someone take you home from the hospital or clinic.  If you will be going home right after the procedure, plan to have someone with you for 24 hours. What happens during the procedure?  An IV tube will be inserted into one of your veins.  Medicine to help you relax (sedative) will be given through the IV tube.  The medical or dental procedure will be performed. What happens after the procedure?  Your blood pressure, heart rate, breathing rate, and blood oxygen level will be monitored often until the medicines you were given have worn off.  Do not drive for 24 hours. This information is not intended to replace advice given to you by your health care provider. Make sure you discuss any questions you have with your health care provider. Document Released: 08/17/2001 Document Revised: 04/27/2016 Document Reviewed: 03/13/2016 Elsevier Interactive Patient Education  2018 [place]  What are the risk, side  effects and possible complications? Generally speaking, most procedures are safe.  However, with any procedure there are risks, side effects, and the possibility of complications.  The risks and complications are dependent upon the sites that are lesioned, or the type of nerve block to be performed.  The closer the procedure is to the spine, the more serious the risks are.  Great care is taken when placing the radio frequency needles, block needles or lesioning probes, but sometimes complications can occur. 1. Infection: Any time there is an injection through the skin, there is a risk of infection.  This is why sterile conditions are used for these blocks.  There are four possible types of infection. 1. Localized skin infection. 2. Central Nervous System Infection-This can be in the form of Meningitis, which can be deadly. 3. Epidural Infections-This can be in the form of an epidural abscess, which can cause pressure inside of the spine, causing compression of the spinal cord with subsequent paralysis. This would require an emergency surgery to decompress, and there are no guarantees that the patient would recover from the paralysis. 4. Discitis-This is an infection of the intervertebral discs.  It occurs in  about 1% of discography procedures.  It is difficult to treat and it may lead to surgery.        2. Pain: the needles have to go through skin and soft tissues, will cause soreness.       3. Damage to internal structures:  The nerves to be lesioned may be near blood vessels or    other nerves which can be potentially damaged.       4. Bleeding: Bleeding is more common if the patient is taking blood thinners such as  aspirin, Coumadin, Ticiid, Plavix, etc., or if he/she have some genetic predisposition  such as hemophilia. Bleeding into the spinal canal can cause compression of the spinal  cord with subsequent paralysis.  This would require an emergency surgery to  decompress and there are no guarantees  that the patient would recover from the  paralysis.       5. Pneumothorax:  Puncturing of a lung is a possibility, every time a needle is introduced in  the area of the chest or upper back.  Pneumothorax refers to free air around the  collapsed lung(s), inside of the thoracic cavity (chest cavity).  Another two possible  complications related to a similar event would include: Hemothorax and Chylothorax.   These are variations of the Pneumothorax, where instead of air around the collapsed  lung(s), you may have blood or chyle, respectively.       6. Spinal headaches: They may occur with any procedures in the area of the spine.       7. Persistent CSF (Cerebro-Spinal Fluid) leakage: This is a rare problem, but may occur  with prolonged intrathecal or epidural catheters either due to the formation of a fistulous  track or a dural tear.       8. Nerve damage: By working so close to the spinal cord, there is always a possibility of  nerve damage, which could be as serious as a permanent spinal cord injury with  paralysis.       9. Death:  Although rare, severe deadly allergic reactions known as "Anaphylactic  reaction" can occur to any of the medications used.      10. Worsening of the symptoms:  We can always make thing worse.  What are the chances of something like this happening? Chances of any of this occuring are extremely low.  By statistics, you have more of a chance of getting killed in a motor vehicle accident: while driving to the hospital than any of the above occurring .  Nevertheless, you should be aware that they are possibilities.  In general, it is similar to taking a shower.  Everybody knows that you can slip, hit your head and get killed.  Does that mean that you should not shower again?  Nevertheless always keep in mind that statistics do not mean anything if you happen to be on the wrong side of them.  Even if a procedure has a 1 (one) in a 1,000,000 (million) chance of going wrong, it you  happen to be that one..Also, keep in mind that by statistics, you have more of a chance of having something go wrong when taking medications.  Who should not have this procedure? If you are on a blood thinning medication (e.g. Coumadin, Plavix, see list of "Blood Thinners"), or if you have an active infection going on, you should not have the procedure.  If you are taking any blood thinners, please inform your physician.  How should  I prepare for this procedure?  Do not eat or drink anything at least six hours prior to the procedure.  Bring a driver with you .  It cannot be a taxi.  Come accompanied by an adult that can drive you back, and that is strong enough to help you if your legs get weak or numb from the local anesthetic.  Take all of your medicines the morning of the procedure with just enough water to swallow them.  If you have diabetes, make sure that you are scheduled to have your procedure done first thing in the morning, whenever possible.  If you have diabetes, take only half of your insulin dose and notify our nurse that you have done so as soon as you arrive at the clinic.  If you are diabetic, but only take blood sugar pills (oral hypoglycemic), then do not take them on the morning of your procedure.  You may take them after you have had the procedure.  Do not take aspirin or any aspirin-containing medications, at least eleven (11) days prior to the procedure.  They may prolong bleeding.  Wear loose fitting clothing that may be easy to take off and that you would not mind if it got stained with Betadine or blood.  Do not wear any jewelry or perfume  Remove any nail coloring.  It will interfere with some of our monitoring equipment.  NOTE: Remember that this is not meant to be interpreted as a complete list of all possible complications.  Unforeseen problems may occur.  BLOOD THINNERS The following drugs contain aspirin or other products, which can cause increased  bleeding during surgery and should not be taken for 2 weeks prior to and 1 week after surgery.  If you should need take something for relief of minor pain, you may take acetaminophen which is found in Tylenol,m Datril, Anacin-3 and Panadol. It is not blood thinner. The products listed below are.  Do not take any of the products listed below in addition to any listed on your instruction sheet.  A.P.C or A.P.C with Codeine Codeine Phosphate Capsules #3 Ibuprofen Ridaura  ABC compound Congesprin Imuran rimadil  Advil Cope Indocin Robaxisal  Alka-Seltzer Effervescent Pain Reliever and Antacid Coricidin or Coricidin-D  Indomethacin Rufen  Alka-Seltzer plus Cold Medicine Cosprin Ketoprofen S-A-C Tablets  Anacin Analgesic Tablets or Capsules Coumadin Korlgesic Salflex  Anacin Extra Strength Analgesic tablets or capsules CP-2 Tablets Lanoril Salicylate  Anaprox Cuprimine Capsules Levenox Salocol  Anexsia-D Dalteparin Magan Salsalate  Anodynos Darvon compound Magnesium Salicylate Sine-off  Ansaid Dasin Capsules Magsal Sodium Salicylate  Anturane Depen Capsules Marnal Soma  APF Arthritis pain formula Dewitt's Pills Measurin Stanback  Argesic Dia-Gesic Meclofenamic Sulfinpyrazone  Arthritis Bayer Timed Release Aspirin Diclofenac Meclomen Sulindac  Arthritis pain formula Anacin Dicumarol Medipren Supac  Analgesic (Safety coated) Arthralgen Diffunasal Mefanamic Suprofen  Arthritis Strength Bufferin Dihydrocodeine Mepro Compound Suprol  Arthropan liquid Dopirydamole Methcarbomol with Aspirin Synalgos  ASA tablets/Enseals Disalcid Micrainin Tagament  Ascriptin Doan's Midol Talwin  Ascriptin A/D Dolene Mobidin Tanderil  Ascriptin Extra Strength Dolobid Moblgesic Ticlid  Ascriptin with Codeine Doloprin or Doloprin with Codeine Momentum Tolectin  Asperbuf Duoprin Mono-gesic Trendar  Aspergum Duradyne Motrin or Motrin IB Triminicin  Aspirin plain, buffered or enteric coated Durasal Myochrisine Trigesic   Aspirin Suppositories Easprin Nalfon Trillsate  Aspirin with Codeine Ecotrin Regular or Extra Strength Naprosyn Uracel  Atromid-S Efficin Naproxen Ursinus  Auranofin Capsules Elmiron Neocylate Vanquish  Axotal Emagrin Norgesic Verin  Azathioprine Empirin or  Empirin with Codeine Normiflo Vitamin E  Azolid Emprazil Nuprin Voltaren  Bayer Aspirin plain, buffered or children's or timed BC Tablets or powders Encaprin Orgaran Warfarin Sodium  Buff-a-Comp Enoxaparin Orudis Zorpin  Buff-a-Comp with Codeine Equegesic Os-Cal-Gesic   Buffaprin Excedrin plain, buffered or Extra Strength Oxalid   Bufferin Arthritis Strength Feldene Oxphenbutazone   Bufferin plain or Extra Strength Feldene Capsules Oxycodone with Aspirin   Bufferin with Codeine Fenoprofen Fenoprofen Pabalate or Pabalate-SF   Buffets II Flogesic Panagesic   Buffinol plain or Extra Strength Florinal or Florinal with Codeine Panwarfarin   Buf-Tabs Flurbiprofen Penicillamine   Butalbital Compound Four-way cold tablets Penicillin   Butazolidin Fragmin Pepto-Bismol   Carbenicillin Geminisyn Percodan   Carna Arthritis Reliever Geopen Persantine   Carprofen Gold's salt Persistin   Chloramphenicol Goody's Phenylbutazone   Chloromycetin Haltrain Piroxlcam   Clmetidine heparin Plaquenil   Cllnoril Hyco-pap Ponstel   Clofibrate Hydroxy chloroquine Propoxyphen         Before stopping any of these medications, be sure to consult the physician who ordered them.  Some, such as Coumadin (Warfarin) are ordered to prevent or treat serious conditions such as "deep thrombosis", "pumonary embolisms", and other heart problems.  The amount of time that you may need off of the medication may also vary with the medication and the reason for which you were taking it.  If you are taking any of these medications, please make sure you notify your pain physician before you undergo any procedures.         Epidural Steroid Injection Patient  Information  Description: The epidural space surrounds the nerves as they exit the spinal cord.  In some patients, the nerves can be compressed and inflamed by a bulging disc or a tight spinal canal (spinal stenosis).  By injecting steroids into the epidural space, we can bring irritated nerves into direct contact with a potentially helpful medication.  These steroids act directly on the irritated nerves and can reduce swelling and inflammation which often leads to decreased pain.  Epidural steroids may be injected anywhere along the spine and from the neck to the low back depending upon the location of your pain.   After numbing the skin with local anesthetic (like Novocaine), a small needle is passed into the epidural space slowly.  You may experience a sensation of pressure while this is being done.  The entire block usually last less than 10 minutes.  Conditions which may be treated by epidural steroids:   Low back and leg pain  Neck and arm pain  Spinal stenosis  Post-laminectomy syndrome  Herpes zoster (shingles) pain  Pain from compression fractures  Preparation for the injection:  1. Do not eat any solid food or dairy products within 8 hours of your appointment.  2. You may drink clear liquids up to 3 hours before appointment.  Clear liquids include water, black coffee, juice or soda.  No milk or cream please. 3. You may take your regular medication, including pain medications, with a sip of water before your appointment  Diabetics should hold regular insulin (if taken separately) and take 1/2 normal NPH dos the morning of the procedure.  Carry some sugar containing items with you to your appointment. 4. A driver must accompany you and be prepared to drive you home after your procedure.  5. Bring all your current medications with your. 6. An IV may be inserted and sedation may be given at the discretion of the physician.   7. A  blood pressure cuff, EKG and other monitors will  often be applied during the procedure.  Some patients may need to have extra oxygen administered for a short period. 8. You will be asked to provide medical information, including your allergies, prior to the procedure.  We must know immediately if you are taking blood thinners (like Coumadin/Warfarin)  Or if you are allergic to IV iodine contrast (dye). We must know if you could possible be pregnant.  Possible side-effects:  Bleeding from needle site  Infection (rare, may require surgery)  Nerve injury (rare)  Numbness & tingling (temporary)  Difficulty urinating (rare, temporary)  Spinal headache ( a headache worse with upright posture)  Light -headedness (temporary)  Pain at injection site (several days)  Decreased blood pressure (temporary)  Weakness in arm/leg (temporary)  Pressure sensation in back/neck (temporary)  Call if you experience:  Fever/chills associated with headache or increased back/neck pain.  Headache worsened by an upright position.  New onset weakness or numbness of an extremity below the injection site  Hives or difficulty breathing (go to the emergency room)  Inflammation or drainage at the infection site  Severe back/neck pain  Any new symptoms which are concerning to you  Please note:  Although the local anesthetic injected can often make your back or neck feel good for several hours after the injection, the pain will likely return.  It takes 3-7 days for steroids to work in the epidural space.  You may not notice any pain relief for at least that one week.  If effective, we will often do a series of three injections spaced 3-6 weeks apart to maximally decrease your pain.  After the initial series, we generally will wait several months before considering a repeat injection of the same type.  If you have any questions, please call [phone] [facility]

## 2018-07-21 ENCOUNTER — Ambulatory Visit: Payer: Medicare Other

## 2018-07-24 ENCOUNTER — Ambulatory Visit: Payer: Medicare Other

## 2018-07-25 ENCOUNTER — Ambulatory Visit: Payer: Medicare Other

## 2018-07-26 ENCOUNTER — Ambulatory Visit: Payer: Medicare Other

## 2018-07-26 ENCOUNTER — Ambulatory Visit: Payer: Medicare Other | Admitting: Physical Therapy

## 2018-07-31 ENCOUNTER — Ambulatory Visit: Payer: Medicare Other

## 2018-08-01 ENCOUNTER — Ambulatory Visit: Payer: Medicare Other

## 2018-08-02 ENCOUNTER — Ambulatory Visit: Payer: Medicare Other

## 2018-08-03 ENCOUNTER — Ambulatory Visit: Payer: Medicare Other

## 2018-08-04 ENCOUNTER — Telehealth: Payer: Self-pay

## 2018-08-04 ENCOUNTER — Telehealth: Payer: Self-pay | Admitting: Family

## 2018-08-04 ENCOUNTER — Ambulatory Visit: Payer: Medicare Other

## 2018-08-04 NOTE — Telephone Encounter (Signed)
LMOVM requesting that pt send manual transmission b/c home monitor has not updated in at least 14 days.   Spoke w/ pt and requested that she send a manual transmission b/c her home monitor has not updated in at least 14 days.   

## 2018-08-04 NOTE — Telephone Encounter (Signed)
Copied from [place] [id number]. Topic: Quick Communication - See Telephone Encounter >> Aug 04, 2018 12:31 PM Rutherford Nail, NT wrote: CRM for notification. See Telephone encounter for: 08/04/18. Patient calling and states that M. Arnett wanted her to call and let her know how she felt after the antibiotics. States that she still has the cough, not as bad but does not seem to go away. CB#: [phone] [id number] Lorina Rabon, [place]

## 2018-08-04 NOTE — Telephone Encounter (Signed)
FYI

## 2018-08-08 ENCOUNTER — Ambulatory Visit: Payer: Medicare Other

## 2018-08-08 ENCOUNTER — Ambulatory Visit (INDEPENDENT_AMBULATORY_CARE_PROVIDER_SITE_OTHER): Payer: Medicare Other | Admitting: *Deleted

## 2018-08-08 DIAGNOSIS — I63219 Cerebral infarction due to unspecified occlusion or stenosis of unspecified vertebral arteries: Secondary | ICD-10-CM

## 2018-08-08 NOTE — Progress Notes (Signed)
Carelink Summary Report / Loop Recorder 

## 2018-08-09 ENCOUNTER — Ambulatory Visit: Payer: Medicare Other

## 2018-08-09 NOTE — Telephone Encounter (Signed)
Copied from [place] [id number]. Topic: Quick Communication - See Telephone Encounter >> Aug 04, 2018 12:31 PM Rutherford Nail, NT wrote: CRM for notification. See Telephone encounter for: 08/04/18. Patient calling and states that M. Arnett wanted her to call and let her know how she felt after the antibiotics. States that she still has the cough, not as bad but does not seem to go away. CB#: [phone] [id number] Lorina Rabon [facility] >> Aug 09, 2018 11:04 AM Yvette Rack wrote: Pt calling again stating that she never heard from anyone and that the [facility] didn't help her she is no better she still have cough and would like to know what to do

## 2018-08-09 NOTE — Telephone Encounter (Signed)
Spoke with patient offered appointment at another location she prefers to come to this location. Scheduled appointment with [name] on 08/11/18@315 

## 2018-08-09 NOTE — Telephone Encounter (Signed)
Call pt  Apologize for my delay in returning her call.  I was under the impression from her first call she was better per phone note.   At this point , she needs re-eval. Likely we need to consider CXR in office.  She can either see me Friday or I suspect lauren has openings on Thursday

## 2018-08-10 ENCOUNTER — Ambulatory Visit: Payer: Medicare Other

## 2018-08-11 ENCOUNTER — Ambulatory Visit (INDEPENDENT_AMBULATORY_CARE_PROVIDER_SITE_OTHER): Payer: Medicare Other | Admitting: Family

## 2018-08-11 ENCOUNTER — Encounter: Payer: Self-pay | Admitting: Family

## 2018-08-11 VITALS — BP 126/82 | HR 97 | Temp 97.8°F | Resp 16 | Wt 197.1 lb

## 2018-08-11 DIAGNOSIS — I63219 Cerebral infarction due to unspecified occlusion or stenosis of unspecified vertebral arteries: Secondary | ICD-10-CM | POA: Diagnosis not present

## 2018-08-11 DIAGNOSIS — R1310 Dysphagia, unspecified: Secondary | ICD-10-CM

## 2018-08-11 DIAGNOSIS — R0982 Postnasal drip: Secondary | ICD-10-CM

## 2018-08-11 DIAGNOSIS — R05 Cough: Secondary | ICD-10-CM

## 2018-08-11 DIAGNOSIS — R0602 Shortness of breath: Secondary | ICD-10-CM | POA: Diagnosis not present

## 2018-08-11 DIAGNOSIS — R059 Cough, unspecified: Secondary | ICD-10-CM

## 2018-08-11 NOTE — Progress Notes (Signed)
Subjective:    Patient ID: Melody Ochoa, female    DOB: February 19, 1941, 77 y.o.   MRN: [id number]  CC: Melody Ochoa is a 77 y.o. female who presents today for an acute visit.    HPI: CC: dry, hacky cough x 3 months, unchanged.  Has seen Dr Pryor Ochoa and told she 'had gerd'. Takes antacid from Dr Pryor Ochoa pantoprozole prn . Endores hoarseness. No burping, belching, epigastric pain.  Trouble swallowing meat, sometimes a couple of swallows. No h/o esophgeal stricture.  No improvement after cefdinir, saline.   Occasionally gets SOB which she has noticed over the last 3 months, when walking from car to house for example. This is unchanged. She has shared this with her pulmonologist.  No sob when still.  No CP, edema, orthopnea, palpitations, dizziness.   No h/o HF.   Wears Loop recorder; follows Dr Joylene Grapes      Echo 2018 LV EF 50-55%  CT chest scheduled 08/21/18.   05/2018 - ramachadran- following lung nodule, Pulmonary fibrosis   HISTORY:  Past Medical History:  Diagnosis Date  . Arthritis   . Atheromatous plaque 10/18/2017  . Back pain   . Cancer ([place])    skin  . Depression   . Diverticulosis   . Dizziness    patient had episode of dizziness when came in room. hx past no dx  . GERD (gastroesophageal reflux disease)   . Heart murmur   . Hyperlipidemia 10/18/2017  . PONV (postoperative nausea and vomiting)    yrs ago  . Stroke [facility])    Past Surgical History:  Procedure Laterality Date  . BACK SURGERY  13  . BREAST BIOPSY Bilateral    cores "years ago"  . BREAST EXCISIONAL BIOPSY Left 90's  . CERVICAL DISC SURGERY  09  . COLONOSCOPY WITH PROPOFOL N/A 01/13/2016   Procedure: COLONOSCOPY WITH PROPOFOL;  Surgeon: [name], MD;  Location: [facility] ENDOSCOPY;  Service: Endoscopy;  Laterality: N/A;  . FACELIFT  94  . LOOP RECORDER INSERTION N/A 08/23/2017   Procedure: LOOP RECORDER INSERTION;  Surgeon: Thompson Grayer, MD;  Location: [facility];  Service: Cardiovascular;   Laterality: N/A;  . TEE WITHOUT CARDIOVERSION N/A 08/12/2017   Procedure: TRANSESOPHAGEAL ECHOCARDIOGRAM (TEE);  Surgeon: Sueanne Margarita, MD;  Location: [facility] ENDOSCOPY;  Service: Cardiovascular;  Laterality: N/A;  . TONSILLECTOMY    . TOTAL HIP ARTHROPLASTY Right 04/22/2016   Procedure: TOTAL HIP ARTHROPLASTY;  Surgeon: Corky Mull, MD;  Location: [facility] ORS;  Service: Orthopedics;  Laterality: Right;   Family History  Problem Relation Age of Onset  . Lung cancer Father   . Aneurysm Brother   . Stroke Brother   . Diabetes Maternal Grandmother   . Kidney disease Maternal Grandfather   . Cushing syndrome Paternal Grandmother   . Dementia Paternal Grandfather   . Breast cancer Maternal Aunt 52    Allergies: Aleve [naproxen sodium]; Aspirin; Celebrex [celecoxib]; Effexor [venlafaxine]; Gabapentin; Hydrocodone-homatropine; Ibuprofen; Plavix [clopidogrel bisulfate]; Tape; Vioxx [rofecoxib]; Other; and Tramadol Current Outpatient Medications on File Prior to Visit  Medication Sig Dispense Refill  . atorvastatin (LIPITOR) 80 MG tablet Take 1 tablet (80 mg total) daily at 6 PM by mouth. 90 tablet 3  . buPROPion (WELLBUTRIN XL) 300 MG 24 hr tablet Take 300 mg by mouth daily.     . Calcium Carbonate-Vitamin D (CALTRATE 600+D PO) Take 1 tablet by mouth daily.    . clopidogrel (PLAVIX) 75 MG tablet Take 1 tablet (75  mg total) by mouth daily. 30 tablet 0  . Cyanocobalamin (VITAMIN B-12 PO) Take 1 tablet by mouth daily.    . DULoxetine (CYMBALTA) 60 MG capsule Take 60 mg by mouth 2 (two) times daily.     . ferrous sulfate 325 (65 FE) MG tablet Take 1 tablet (325 mg total) by mouth daily. 30 tablet 0  . oxyCODONE (OXY IR/ROXICODONE) 5 MG immediate release tablet Take 0.5 tablets (2.5 mg total) by mouth 2 (two) times daily as needed for severe pain. 30 tablet 0  . triamcinolone cream (KENALOG) 0.1 % Apply 1 application topically as needed.  0  . vitamin C (ASCORBIC ACID) 500 MG tablet Take 500 mg by mouth  daily.     No current facility-administered medications on file prior to visit.     Social History   Tobacco Use  . Smoking status: Former Smoker    Packs/day: 1.00    Years: 40.00    Pack years: 40.00    Types: Cigarettes    Last attempt to quit: 11/13/1996    Years since quitting: 21.7  . Smokeless tobacco: Never Used  Substance Use Topics  . Alcohol use: Yes    Comment: occ wine  . Drug use: No    Review of Systems  Constitutional: Negative for chills and fever.  HENT: Positive for trouble swallowing. Negative for congestion and sore throat.   Respiratory: Positive for cough and shortness of breath.   Cardiovascular: Negative for chest pain, palpitations and leg swelling.  Gastrointestinal: Negative for nausea and vomiting.      Objective:    BP 126/82 (BP Location: Left Arm, Patient Position: Sitting, Cuff Size: Large)   Pulse 97   Temp 97.8 F (36.6 C) (Oral)   Resp 16   Wt 197 lb 2 oz (89.4 kg)   SpO2 92% Comment: after with exertion  BMI 33.84 kg/m    Physical Exam  Constitutional: She appears well-developed and well-nourished.  Eyes: Conjunctivae are normal.  Cardiovascular: Normal rate, regular rhythm, normal heart sounds and normal pulses.  No LE edema  Pulmonary/Chest: Effort normal and breath sounds normal. She has no wheezes. She has no rhonchi. She has no rales.  Neurological: She is alert.  Skin: Skin is warm and dry.  Psychiatric: She has a normal mood and affect. Her speech is normal and behavior is normal. Thought content normal.  Vitals reviewed.      Assessment & Plan:   Problem List Items Addressed This Visit      Digestive   Dysphagia    New. Concern in setting of former smoker, and potential GERD, advised she likely needs EGD. Have placed referral to GI.         Other   Cough - Primary    Sa02 97%. Suspect GERD contributory, Trial of zantac. She also has pulmonology fibrosis which likely plays a role. Patient will let me know  if no improvement.       SOB (shortness of breath) on exertion    Chronic, unchanged. Walking sa02 dropped to 92% , quickly rebounded to 97%. Patient is well appearing. No acute respiratory distress. Former smoker. Advised she continue to follow with pulmonology which she verbalized understanding.            I have discontinued Ruthmary Occhipinti. Wilkowski's acetaminophen and cefdinir. I am also having her maintain her DULoxetine, Calcium Carbonate-Vitamin D (CALTRATE 600+D PO), buPROPion, vitamin C, Cyanocobalamin (VITAMIN B-12 PO), ferrous sulfate, clopidogrel, atorvastatin, triamcinolone cream,  and oxyCODONE.   No orders of the defined types were placed in this encounter.   Return precautions given.   Risks, benefits, and alternatives of the medications and treatment plan prescribed today were discussed, and patient expressed understanding.   Education regarding symptom management and diagnosis given to patient on AVS.  Continue to follow with Burnard Hawthorne, FNP for routine health maintenance.   [name] and I agreed with plan.   [name], FNP

## 2018-08-11 NOTE — Assessment & Plan Note (Signed)
New. Concern in setting of former smoker, and potential GERD, advised she likely needs EGD. Have placed referral to GI.

## 2018-08-11 NOTE — Assessment & Plan Note (Signed)
Chronic, unchanged. Walking sa02 dropped to 92% , quickly rebounded to 97%. Patient is well appearing. No acute respiratory distress. Former smoker. Advised she continue to follow with pulmonology which she verbalized understanding.

## 2018-08-11 NOTE — Patient Instructions (Addendum)
May stop pantoprazole  START zantac twice daily which is over the counter. Do this consistently as think cough is related to this.  Secondarily cough may be related pulmonology. Please discuss with Dr Ashby Dawes  and ensure you have follow up appointment with him   Today we discussed referrals, orders. gastroenterology   I have placed these orders in the system for you.  Please be sure to give [place] a call if you have not heard from our office regarding scheduling a test or regarding referral in a timely manner.  It is very important that you let me know as soon as possible.

## 2018-08-11 NOTE — Assessment & Plan Note (Signed)
Sa02 97%. Suspect GERD contributory, Trial of zantac. She also has pulmonology fibrosis which likely plays a role. Patient will let me know if no improvement.

## 2018-08-14 ENCOUNTER — Ambulatory Visit: Payer: Medicare Other

## 2018-08-15 ENCOUNTER — Ambulatory Visit: Payer: Medicare Other

## 2018-08-15 LAB — CUP PACEART REMOTE DEVICE CHECK
Date Time Interrogation Session: 20190730193755
Implantable Pulse Generator Implant Date: 20180918

## 2018-08-16 ENCOUNTER — Ambulatory Visit: Payer: Medicare Other

## 2018-08-16 ENCOUNTER — Ambulatory Visit: Payer: Medicare Other | Admitting: Student in an Organized Health Care Education/Training Program

## 2018-08-17 ENCOUNTER — Ambulatory Visit: Payer: Medicare Other

## 2018-08-21 ENCOUNTER — Ambulatory Visit
Admission: RE | Admit: 2018-08-21 | Discharge: 2018-08-21 | Disposition: A | Discharge status: Home / Self Care | Payer: Medicare Other | Source: Ambulatory Visit | Attending: Student in an Organized Health Care Education/Training Program | Admitting: Student in an Organized Health Care Education/Training Program

## 2018-08-21 ENCOUNTER — Ambulatory Visit
Admission: RE | Admit: 2018-08-21 | Discharge: 2018-08-21 | Disposition: A | Discharge status: Home / Self Care | Payer: Medicare Other | Source: Ambulatory Visit | Attending: Internal Medicine | Admitting: Internal Medicine

## 2018-08-21 ENCOUNTER — Ambulatory Visit (HOSPITAL_BASED_OUTPATIENT_CLINIC_OR_DEPARTMENT_OTHER): Payer: Medicare Other | Admitting: Student in an Organized Health Care Education/Training Program

## 2018-08-21 ENCOUNTER — Encounter: Payer: Self-pay | Admitting: Student in an Organized Health Care Education/Training Program

## 2018-08-21 VITALS — BP 148/89 | HR 81 | Temp 98.4°F | Resp 14 | Ht 64.0 in | Wt 185.0 lb

## 2018-08-21 DIAGNOSIS — D3502 Benign neoplasm of left adrenal gland: Secondary | ICD-10-CM | POA: Insufficient documentation

## 2018-08-21 DIAGNOSIS — M5416 Radiculopathy, lumbar region: Secondary | ICD-10-CM

## 2018-08-21 DIAGNOSIS — R911 Solitary pulmonary nodule: Secondary | ICD-10-CM | POA: Insufficient documentation

## 2018-08-21 DIAGNOSIS — J479 Bronchiectasis, uncomplicated: Secondary | ICD-10-CM | POA: Diagnosis not present

## 2018-08-21 DIAGNOSIS — I2510 Atherosclerotic heart disease of native coronary artery without angina pectoris: Secondary | ICD-10-CM | POA: Insufficient documentation

## 2018-08-21 DIAGNOSIS — I700 Atherosclerosis of aorta: Secondary | ICD-10-CM | POA: Diagnosis not present

## 2018-08-21 MED ORDER — IOPAMIDOL (ISOVUE-M 200) INJECTION 41%
10.0000 mL | Freq: Once | INTRAMUSCULAR | Status: AC
  Administered 2018-08-21: 10 mL via EPIDURAL
  Filled 2018-08-21: qty 10

## 2018-08-21 MED ORDER — LIDOCAINE HCL 2 % IJ SOLN
10.0000 mL | Freq: Once | INTRAMUSCULAR | Status: AC
  Administered 2018-08-21: 200 mg
  Filled 2018-08-21: qty 40

## 2018-08-21 MED ORDER — ROPIVACAINE HCL 2 MG/ML IJ SOLN
2.0000 mL | Freq: Once | INTRAMUSCULAR | Status: AC
  Administered 2018-08-21: 2 mL via EPIDURAL
  Filled 2018-08-21: qty 10

## 2018-08-21 MED ORDER — DEXAMETHASONE SODIUM PHOSPHATE 10 MG/ML IJ SOLN
10.0000 mg | Freq: Once | INTRAMUSCULAR | Status: AC
  Administered 2018-08-21: 10 mg
  Filled 2018-08-21: qty 1

## 2018-08-21 MED ORDER — SODIUM CHLORIDE 0.9% FLUSH
2.0000 mL | Freq: Once | INTRAVENOUS | Status: AC
  Administered 2018-08-21: 2 mL

## 2018-08-21 NOTE — Progress Notes (Signed)
Nursing Pain Medication Assessment:  Safety precautions to be maintained throughout the outpatient stay will include: orient to surroundings, keep bed in low position, maintain call bell within reach at all times, provide assistance with transfer out of bed and ambulation.  Medication Inspection Compliance: Pill count conducted under aseptic conditions, in front of the patient. Neither the pills nor the bottle was removed from the patient's sight at any time. Once count was completed pills were immediately returned to the patient in their original bottle.  Medication: Oxycodone IR Pill/Patch Count: 19.5 of 30 pills remain Pill/Patch Appearance: Markings consistent with prescribed medication Bottle Appearance: Standard pharmacy container. Clearly labeled. Filled Date: 08 / 15 / 2019 Last Medication intake:  Day before yesterday

## 2018-08-21 NOTE — Progress Notes (Signed)
Patient's Name: Melody Ochoa  MRN: [id number]  Referring Provider: Burnard Hawthorne, FNP  DOB: 1941-08-05  PCP: Burnard Hawthorne, FNP  DOS: 08/21/2018  Note by: Gillis Santa, MD  Service setting: Ambulatory outpatient  Specialty: Interventional Pain Management  Patient type: Established  Location: [facility] (AMB) Pain Management Facility  Visit type: Interventional Procedure   Primary Reason for Visit: Interventional Pain Management Treatment. CC: Back Pain (lumbar, left is worse )  Procedure:       Anesthesia, Analgesia, Anxiolysis:  Type: Therapeutic Inter-Laminar Epidural Steroid Injection #2  Region: Lumbar Level: L1-2 Level. Laterality: Left-Sided         Type: Local Anesthesia Indication(s): Analgesia         Route: Infiltration ([place]/IM) IV Access: Declined Sedation: Declined  Local Anesthetic: Lidocaine 1%   Indications: 1. Lumbar radiculopathy    Pain Score: Pre-procedure: 7 /10 Post-procedure: 0-No pain/10  Pre-op Assessment:  Melody Ochoa is a 77 y.o. (year old), female patient, seen today for interventional treatment. She  has a past surgical history that includes Tonsillectomy; Facelift (94); Cervical disc surgery (09); Back surgery (13); Colonoscopy with propofol (N/A, 01/13/2016); Total hip arthroplasty (Right, 04/22/2016); TEE without cardioversion (N/A, 08/12/2017); LOOP RECORDER INSERTION (N/A, 08/23/2017); Breast excisional biopsy (Left, 90's); and Breast biopsy (Bilateral). Melody Ochoa has a current medication list which includes the following prescription(s): atorvastatin, bupropion, calcium carbonate-vitamin d, clopidogrel, cyanocobalamin, duloxetine, ferrous sulfate, ranitidine, triamcinolone cream, and vitamin c. Her primarily concern today is the Back Pain (lumbar, left is worse )  77 year old female with a history of posterior L3-L5 spinal fusion and decompression on 12/03/2016 who presents with axial low back pain that is worse on the left that radiates into her  left groin and buttock secondary to chronic lumbar radiculopathy here for left lumbar epidural steroid injection #3.  Patient stopped Plavix greater than 10 days ago.    Initial Vital Signs:  Pulse Rate: 79 Temp: 98.4 F (36.9 C) Resp: 16 BP: (!) 145/72 SpO2: 100 %  BMI: Estimated body mass index is 31.76 kg/m as calculated from the following:   Height as of this encounter: 5\' 4"  (1.626 m).   Weight as of this encounter: 185 lb (83.9 kg).  Risk Assessment: Allergies: Reviewed. She is allergic to aleve [naproxen sodium]; aspirin; celebrex [celecoxib]; effexor [venlafaxine]; gabapentin; hydrocodone-homatropine; ibuprofen; plavix [clopidogrel bisulfate]; tape; vioxx [rofecoxib]; other; and tramadol.  Allergy Precautions: None required Coagulopathies: Reviewed. None identified.  Blood-thinner therapy: None at this time Active Infection(s): Reviewed. None identified. Melody Ochoa is afebrile  Site Confirmation: Melody Ochoa was asked to confirm the procedure and laterality before marking the site Procedure checklist: Completed Consent: Before the procedure and under the influence of no sedative(s), amnesic(s), or anxiolytics, the patient was informed of the treatment options, risks and possible complications. To fulfill our ethical and legal obligations, as recommended by the American Medical Association's Code of Ethics, I have informed the patient of my clinical impression; the nature and purpose of the treatment or procedure; the risks, benefits, and possible complications of the intervention; the alternatives, including doing nothing; the risk(s) and benefit(s) of the alternative treatment(s) or procedure(s); and the risk(s) and benefit(s) of doing nothing. The patient was provided information about the general risks and possible complications associated with the procedure. These may include, but are not limited to: failure to achieve desired goals, infection, bleeding, organ or nerve  damage, allergic reactions, paralysis, and death. In addition, the patient was informed of those risks and complications  associated to Spine-related procedures, such as failure to decrease pain; infection (i.e.: Meningitis, epidural or intraspinal abscess); bleeding (i.e.: epidural hematoma, subarachnoid hemorrhage, or any other type of intraspinal or peri-dural bleeding); organ or nerve damage (i.e.: Any type of peripheral nerve, nerve root, or spinal cord injury) with subsequent damage to sensory, motor, and/or autonomic systems, resulting in permanent pain, numbness, and/or weakness of one or several areas of the body; allergic reactions; (i.e.: anaphylactic reaction); and/or death. Furthermore, the patient was informed of those risks and complications associated with the medications. These include, but are not limited to: allergic reactions (i.e.: anaphylactic or anaphylactoid reaction(s)); adrenal axis suppression; blood sugar elevation that in diabetics may result in ketoacidosis or comma; water retention that in patients with history of congestive heart failure may result in shortness of breath, pulmonary edema, and decompensation with resultant heart failure; weight gain; swelling or edema; medication-induced neural toxicity; particulate matter embolism and blood vessel occlusion with resultant organ, and/or nervous system infarction; and/or aseptic necrosis of one or more joints. Finally, the patient was informed that Medicine is not an exact science; therefore, there is also the possibility of unforeseen or unpredictable risks and/or possible complications that may result in a catastrophic outcome. The patient indicated having understood very clearly. We have given the patient no guarantees and we have made no promises. Enough time was given to the patient to ask questions, all of which were answered to the patient's satisfaction. Melody Ochoa has indicated that she wanted to continue with the  procedure. Attestation: I, the ordering provider, attest that I have discussed with the patient the benefits, risks, side-effects, alternatives, likelihood of achieving goals, and potential problems during recovery for the procedure that I have provided informed consent. Date  Time: 08/21/2018 12:06 PM  Pre-Procedure Preparation:  Monitoring: As per clinic protocol. Respiration, ETCO2, SpO2, BP, heart rate and rhythm monitor placed and checked for adequate function Safety Precautions: Patient was assessed for positional comfort and pressure points before starting the procedure. Time-out: I initiated and conducted the "Time-out" before starting the procedure, as per protocol. The patient was asked to participate by confirming the accuracy of the "Time Out" information. Verification of the correct person, site, and procedure were performed and confirmed by me, the nursing staff, and the patient. "Time-out" conducted as per Joint Commission's Universal Protocol (UP.01.01.01). Time: 1303  Description of Procedure:       Position: Prone with head of the table was raised to facilitate breathing. Target Area: The interlaminar space, initially targeting the lower laminar border of the superior vertebral body. Approach: Paramedial approach. Area Prepped: Entire Posterior Lumbar Region Prepping solution: ChloraPrep (2% chlorhexidine gluconate and 70% isopropyl alcohol) Safety Precautions: Aspiration looking for blood return was conducted prior to all injections. At no point did we inject any substances, as a needle was being advanced. No attempts were made at seeking any paresthesias. Safe injection practices and needle disposal techniques used. Medications properly checked for expiration dates. SDV (single dose vial) medications used. Description of the Procedure: Protocol guidelines were followed. The procedure needle was introduced through the skin, ipsilateral to the reported pain, and advanced to the  target area. Bone was contacted and the needle walked caudad, until the lamina was cleared. The epidural space was identified using "loss-of-resistance technique" with 2-3 ml of PF-NaCl (0.9% NSS), in a 5cc LOR glass syringe. Vitals:   08/21/18 1213 08/21/18 1300 08/21/18 1305 08/21/18 1313  BP: (!) 145/72 (!) 151/91 (!) 141/80 (!) 148/89  Pulse:  79 87 83 81  Resp: 16 15 13 14   Temp: 98.4 F (36.9 C)     TempSrc: Oral     SpO2: 100% 96% 100% 100%  Weight: 185 lb (83.9 kg)     Height: 5\' 4"  (1.626 m)       Start Time: 1303 hrs. End Time: 1309 hrs. Materials:  Needle(s) Type: Epidural needle Gauge: 17G Length: 3.5-in Medication(s): Please see orders for medications and dosing details. 9 CC solution made of 6cc of preservative-free saline, 2 cc of 0.2% ropivacaine, 1 cc of Decadron 10 mg/cc Imaging Guidance (Spinal):  Type of Imaging Technique: Fluoroscopy Guidance (Spinal) Indication(s): Assistance in needle guidance and placement for procedures requiring needle placement in or near specific anatomical locations not easily accessible without such assistance. Exposure Time: Please see nurses notes. Contrast: Before injecting any contrast, we confirmed that the patient did not have an allergy to iodine, shellfish, or radiological contrast. Once satisfactory needle placement was completed at the desired level, radiological contrast was injected. Contrast injected under live fluoroscopy. No contrast complications. See chart for type and volume of contrast used. Fluoroscopic Guidance: I was personally present during the use of fluoroscopy. "Tunnel Vision Technique" used to obtain the best possible view of the target area. Parallax error corrected before commencing the procedure. "Direction-depth-direction" technique used to introduce the needle under continuous pulsed fluoroscopy. Once target was reached, antero-posterior, oblique, and lateral fluoroscopic projection used confirm needle placement  in all planes. Images permanently stored in EMR. Interpretation: I personally interpreted the imaging intraoperatively. Adequate needle placement confirmed in multiple planes. Appropriate spread of contrast into desired area was observed. No evidence of afferent or efferent intravascular uptake. No intrathecal or subarachnoid spread observed. Permanent images saved into the patient's record.  Antibiotic Prophylaxis:   Anti-infectives (From admission, onward)   None     Indication(s): None identified  Post-operative Assessment:  Post-procedure Vital Signs:  Pulse Rate: 81 Temp: 98.4 F (36.9 C) Resp: 14 BP: (!) 148/89 SpO2: 100 %  EBL: None  Complications: No immediate post-treatment complications observed by team, or reported by patient.  Note: The patient tolerated the entire procedure well. A repeat set of vitals were taken after the procedure and the patient was kept under observation following institutional policy, for this type of procedure. Post-procedural neurological assessment was performed, showing return to baseline, prior to discharge. The patient was provided with post-procedure discharge instructions, including a section on how to identify potential problems. Should any problems arise concerning this procedure, the patient was given instructions to immediately contact us, at any time, without hesitation. In any case, we plan to contact the patient by telephone for a follow-up status report regarding this interventional procedure.  Comments:  No additional relevant information.  5 out of 5 strength bilateral lower extremity: Plantar flexion, dorsiflexion, knee flexion, knee extension.   Plan of Care    Imaging Orders     DG C-Arm 1-60 Min-No Report Procedure Orders    No procedure(s) ordered today   Patient instructed to resume Plavix tomorrow after nursing staff calls for postprocedural evaluation.  As long as patient is not having any new lower ext. weakness,  patient instructed to restart Plavix 24 hours after procedure.  Medications ordered for procedure: Meds ordered this encounter  Medications  . iopamidol (ISOVUE-M) 41 % intrathecal injection 10 mL  . ropivacaine (PF) 2 mg/mL (0.2%) (NAROPIN) injection 2 mL  . sodium chloride flush (NS) 0.9 % injection 2 mL  . lidocaine (  XYLOCAINE) 2 % (with pres) injection 200 mg  . dexamethasone (DECADRON) injection 10 mg   Medications administered: We administered iopamidol, ropivacaine (PF) 2 mg/mL (0.2%), sodium chloride flush, lidocaine, and dexamethasone.  See the medical record for exact dosing, route, and time of administration.  New Prescriptions   No medications on file   Disposition: Discharge home  Discharge Date & Time: 08/21/2018; 1317 hrs.   Physician-requested Follow-up: Return in about 5 weeks (around 09/25/2018) for Post Procedure Evaluation.  Future Appointments  Date Time Provider [place]  08/24/2018 10:00 AM Laverle Hobby, MD [facility] None  09/08/2018  7:40 AM [facility] DEVICE REMOTES [facility]  09/26/2018 10:45 AM Gillis Santa, MD [facility] None  02/16/2019  2:00 PM O'Brien-Blaney, Bryson Corona, LPN [facility]   Primary Care Physician: Burnard Hawthorne, FNP Location: [facility] Facility Note by: Gillis Santa, MD Date: 08/21/2018; Time: 2:02 PM  Disclaimer:  Medicine is not an exact science. The only guarantee in medicine is that nothing is guaranteed. It is important to note that the decision to proceed with this intervention was based on the information collected from the patient. The Data and conclusions were drawn from the patient's questionnaire, the interview, and the physical examination. Because the information was provided in large part by the patient, it cannot be guaranteed that it has not been purposely or unconsciously manipulated. Every effort has been made to obtain as much relevant data as possible for this  evaluation. It is important to note that the conclusions that lead to this procedure are derived in large part from the available data. Always take into account that the treatment will also be dependent on availability of resources and existing treatment guidelines, considered by other Pain Management Practitioners as being common knowledge and practice, at the time of the intervention. For Medico-Legal purposes, it is also important to point out that variation in procedural techniques and pharmacological choices are the acceptable norm. The indications, contraindications, technique, and results of the above procedure should only be interpreted and judged by a Board-Certified Interventional Pain Specialist with extensive familiarity and expertise in the same exact procedure and technique.

## 2018-08-22 ENCOUNTER — Ambulatory Visit: Payer: Medicare Other

## 2018-08-22 ENCOUNTER — Telehealth: Payer: Self-pay

## 2018-08-22 NOTE — Telephone Encounter (Signed)
Post procedure phone call.  LM 

## 2018-08-22 NOTE — Telephone Encounter (Signed)
Talked with patient, She states that her lower back pain is better but is having a headache since yesterday 5/10. Not having any dizziness while standing, has been sitting all morning and drinking coffee. Encourage pt to drink water. She will call back if headache not better or ED if symptoms worsen. Asked patient if light made her head worse and she stated no.   Any other advice needed.

## 2018-08-23 NOTE — Progress Notes (Signed)
[facility] Consultation      Assessment and Plan:  Lung nodule. - Small groundglass nodule in left upper lobe, initially seen September 2018. - Continue surveillance for 3 to 5 years total for ground glass lung nodule.  Repeat CT chest in 9 months. Follow up after CT chest.   Pulmonary fibrosis. -Right upper lobe honeycomb changing with traction bronchiectasis, which does not appear typical for UIP, however the patient does have bibasilar subpleural reticular changes which do appear suggestive of UIP. - Currently the patient does not appear to be symptomatic, will continue to monitor. --She continues to have some swelling of the distal interphalangeal joints on her fingers,will check a RF and ACE.   Orders Placed This Encounter  Procedures  . CT CHEST WO CONTRAST  . Angiotensin converting enzyme  . Rheumatoid factor    Return in about 9 months (around 05/25/2019) for follow up at CT chest in 9 months. .     Date: 08/23/2018  MRN# [id number] KAYONA FOOR 03-17-1941   Melody Ochoa is a 77 y.o. old female seen in consultation for chief complaint of:    Chief Complaint  Patient presents with  . Shortness of Breath    unchanged since last visit. pt here to f/u with CT results.  . Cough    non productive    HPI:   The patient is a 77 year old female with a history of chronic bronchitis, cough.  She had previously seen Dr. Raul Del in April 2017.  At that time it was noted that she had basilar scarring, and elevated right diaphragm. At last visit she was noted to have a small  LUL ground glass nodule, since her last visit she has repeat a CT chest which did not show a significant change in the nodule in the past year. She was sent for RF and ACE levels but she did not have them drawn.   **CT chest 08/21/2018 images personally reviewed,  in comparison with previous on 08/19/2017; there is an unchanged left upper lobe honeycomb fibrosis peripherally when  compared to previous, per radiology report this may be slightly larger than previous.  Some extends medially with traction bronchiectasis, mild subpleural interstitial changes bilaterally.  Elevated right diaphragm.    Medication:    Current Outpatient Medications:  .  atorvastatin (LIPITOR) 80 MG tablet, Take 1 tablet (80 mg total) daily at 6 PM by mouth., Disp: 90 tablet, Rfl: 3 .  buPROPion (WELLBUTRIN XL) 300 MG 24 hr tablet, Take 300 mg by mouth daily. , Disp: , Rfl:  .  Calcium Carbonate-Vitamin D (CALTRATE 600+D PO), Take 1 tablet by mouth daily., Disp: , Rfl:  .  clopidogrel (PLAVIX) 75 MG tablet, Take 1 tablet (75 mg total) by mouth daily., Disp: 30 tablet, Rfl: 0 .  Cyanocobalamin (VITAMIN B-12 PO), Take 1 tablet by mouth daily., Disp: , Rfl:  .  DULoxetine (CYMBALTA) 60 MG capsule, Take 60 mg by mouth 2 (two) times daily. , Disp: , Rfl:  .  ferrous sulfate 325 (65 FE) MG tablet, Take 1 tablet (325 mg total) by mouth daily., Disp: 30 tablet, Rfl: 0 .  ranitidine (ZANTAC) 75 MG tablet, Take 75 mg by mouth 2 (two) times daily., Disp: , Rfl:  .  triamcinolone cream (KENALOG) 0.1 %, Apply 1 application topically as needed., Disp: , Rfl: 0 .  vitamin C (ASCORBIC ACID) 500 MG tablet, Take 500 mg by mouth daily., Disp: , Rfl:    Allergies:  Aleve [naproxen sodium]; Aspirin; Celebrex [celecoxib]; Effexor [venlafaxine]; Gabapentin; Hydrocodone-homatropine; Ibuprofen; Plavix [clopidogrel bisulfate]; Tape; Vioxx [rofecoxib]; Other; and Tramadol  Review of Systems:  Constitutional: Feels well. Cardiovascular: Denies chest pain, exertional chest pain.  Pulmonary: Denies hemoptysis, pleuritic chest pain.   The remainder of systems were reviewed and were found to be negative other than what is documented in the HPI.    Physical Examination:   VS: BP 130/70 (BP Location: Left Arm, Cuff Size: Large)   Pulse 90   Resp 16   Ht 5\' 4"  (1.626 m)   Wt 196 lb (88.9 kg)   SpO2 100%   BMI 33.64  kg/m   General Appearance: No distress  Neuro:without focal findings, mental status, speech normal, alert and oriented HEENT: PERRLA, EOM intact Pulmonary: No wheezing, No rales  CardiovascularNormal S1,S2.  No m/r/g.  Abdomen: Benign, Soft, non-tender, No masses Renal:  No costovertebral tenderness  GU:  No performed at this time. Endoc: No evident thyromegaly, no signs of acromegaly or Cushing features Skin:   warm, no rashes, no ecchymosis  Extremities: normal, no cyanosis, clubbing.      LABORATORY PANEL:   CBC No results for input(s): WBC, HGB, HCT, PLT in the last 168 hours. ------------------------------------------------------------------------------------------------------------------  Chemistries  No results for input(s): NA, K, CL, CO2, GLUCOSE, BUN, CREATININE, CALCIUM, MG, AST, ALT, ALKPHOS, BILITOT in the last 168 hours.  Invalid input(s): GFRCGP ------------------------------------------------------------------------------------------------------------------  Cardiac Enzymes No results for input(s): TROPONINI in the last 168 hours. ------------------------------------------------------------  RADIOLOGY:  No results found.     Thank  you for the consultation and for allowing [facility] to assist in the care of your patient. Our recommendations are noted above.  Please contact us if we can be of further service.  Marda Stalker, M.D., F.C.C.P.  Board Certified in Internal Medicine, Pulmonary Medicine, [place], and Sleep Medicine.  [facility] Office Number: [phone]  08/23/2018

## 2018-08-24 ENCOUNTER — Encounter: Payer: Self-pay | Admitting: Internal Medicine

## 2018-08-24 ENCOUNTER — Ambulatory Visit (INDEPENDENT_AMBULATORY_CARE_PROVIDER_SITE_OTHER): Payer: Medicare Other | Admitting: Internal Medicine

## 2018-08-24 ENCOUNTER — Ambulatory Visit: Payer: Medicare Other

## 2018-08-24 VITALS — BP 130/70 | HR 90 | Resp 16 | Ht 64.0 in | Wt 196.0 lb

## 2018-08-24 DIAGNOSIS — I63219 Cerebral infarction due to unspecified occlusion or stenosis of unspecified vertebral arteries: Secondary | ICD-10-CM

## 2018-08-24 DIAGNOSIS — J8410 Pulmonary fibrosis, unspecified: Secondary | ICD-10-CM | POA: Diagnosis not present

## 2018-08-24 DIAGNOSIS — R0609 Other forms of dyspnea: Secondary | ICD-10-CM | POA: Diagnosis not present

## 2018-08-24 DIAGNOSIS — R911 Solitary pulmonary nodule: Secondary | ICD-10-CM

## 2018-08-24 DIAGNOSIS — R0600 Dyspnea, unspecified: Secondary | ICD-10-CM

## 2018-08-24 DIAGNOSIS — R918 Other nonspecific abnormal finding of lung field: Secondary | ICD-10-CM

## 2018-08-24 NOTE — Patient Instructions (Signed)
Will send for blood work.  Will repeat CT chest in 9 months, follow up after that.

## 2018-08-25 ENCOUNTER — Other Ambulatory Visit
Admission: RE | Admit: 2018-08-25 | Discharge: 2018-08-25 | Disposition: A | Discharge status: Home / Self Care | Payer: Medicare Other | Source: Ambulatory Visit | Attending: Internal Medicine | Admitting: Internal Medicine

## 2018-08-25 DIAGNOSIS — R911 Solitary pulmonary nodule: Secondary | ICD-10-CM | POA: Insufficient documentation

## 2018-08-25 DIAGNOSIS — R918 Other nonspecific abnormal finding of lung field: Secondary | ICD-10-CM | POA: Diagnosis not present

## 2018-08-25 DIAGNOSIS — J8410 Pulmonary fibrosis, unspecified: Secondary | ICD-10-CM

## 2018-08-25 DIAGNOSIS — R0609 Other forms of dyspnea: Secondary | ICD-10-CM | POA: Insufficient documentation

## 2018-08-25 DIAGNOSIS — R0600 Dyspnea, unspecified: Secondary | ICD-10-CM

## 2018-08-26 LAB — RHEUMATOID FACTOR: Rhuematoid fact SerPl-aCnc: <10 IU/mL (ref 0.0–13.9)

## 2018-08-26 LAB — ANGIOTENSIN CONVERTING ENZYME: Angiotensin-Converting Enzyme: 37 U/L (ref 14–82)

## 2018-08-29 ENCOUNTER — Ambulatory Visit: Payer: Medicare Other

## 2018-08-30 ENCOUNTER — Ambulatory Visit: Payer: Medicare Other

## 2018-09-01 LAB — CUP PACEART REMOTE DEVICE CHECK
Date Time Interrogation Session: 20190901201052
Implantable Pulse Generator Implant Date: 20180918

## 2018-09-08 ENCOUNTER — Ambulatory Visit (INDEPENDENT_AMBULATORY_CARE_PROVIDER_SITE_OTHER): Payer: Medicare Other | Admitting: *Deleted

## 2018-09-08 DIAGNOSIS — I63219 Cerebral infarction due to unspecified occlusion or stenosis of unspecified vertebral arteries: Secondary | ICD-10-CM

## 2018-09-09 NOTE — Progress Notes (Signed)
Carelink Summary Report / Loop Recorder 

## 2018-09-11 ENCOUNTER — Telehealth: Payer: Self-pay

## 2018-09-11 DIAGNOSIS — K219 Gastro-esophageal reflux disease without esophagitis: Secondary | ICD-10-CM | POA: Diagnosis not present

## 2018-09-11 DIAGNOSIS — J301 Allergic rhinitis due to pollen: Secondary | ICD-10-CM | POA: Diagnosis not present

## 2018-09-11 DIAGNOSIS — R05 Cough: Secondary | ICD-10-CM | POA: Diagnosis not present

## 2018-09-11 NOTE — Telephone Encounter (Signed)
Copied from [facility] [id number]. Topic: General - Other >> Sep 11, 2018 10:59 AM Yvette Rack wrote: Reason for CRM: Mirna Mires with [facility] states pt saw Dr Pryor Ochoa today for a lung CT and pt has been recommended for a  pet CT . Mirna Mires would like a call regarding having the pet CT ordered. Cb# [phone] Ext. [place]

## 2018-09-12 ENCOUNTER — Other Ambulatory Visit: Payer: Self-pay

## 2018-09-12 ENCOUNTER — Encounter: Payer: Self-pay | Admitting: Student in an Organized Health Care Education/Training Program

## 2018-09-12 ENCOUNTER — Ambulatory Visit
Payer: Medicare Other | Attending: Student in an Organized Health Care Education/Training Program | Admitting: Student in an Organized Health Care Education/Training Program

## 2018-09-12 ENCOUNTER — Telehealth: Payer: Self-pay | Admitting: Internal Medicine

## 2018-09-12 VITALS — BP 131/76 | HR 69 | Temp 97.6°F | Resp 16 | Ht 64.0 in | Wt 182.0 lb

## 2018-09-12 DIAGNOSIS — M961 Postlaminectomy syndrome, not elsewhere classified: Secondary | ICD-10-CM | POA: Insufficient documentation

## 2018-09-12 DIAGNOSIS — M5416 Radiculopathy, lumbar region: Secondary | ICD-10-CM

## 2018-09-12 DIAGNOSIS — E785 Hyperlipidemia, unspecified: Secondary | ICD-10-CM | POA: Diagnosis not present

## 2018-09-12 DIAGNOSIS — Z818 Family history of other mental and behavioral disorders: Secondary | ICD-10-CM | POA: Diagnosis not present

## 2018-09-12 DIAGNOSIS — Z79899 Other long term (current) drug therapy: Secondary | ICD-10-CM | POA: Diagnosis not present

## 2018-09-12 DIAGNOSIS — Z833 Family history of diabetes mellitus: Secondary | ICD-10-CM | POA: Insufficient documentation

## 2018-09-12 DIAGNOSIS — M5137 Other intervertebral disc degeneration, lumbosacral region: Secondary | ICD-10-CM | POA: Diagnosis not present

## 2018-09-12 DIAGNOSIS — M7062 Trochanteric bursitis, left hip: Secondary | ICD-10-CM | POA: Insufficient documentation

## 2018-09-12 DIAGNOSIS — M4316 Spondylolisthesis, lumbar region: Secondary | ICD-10-CM | POA: Diagnosis not present

## 2018-09-12 DIAGNOSIS — M5116 Intervertebral disc disorders with radiculopathy, lumbar region: Secondary | ICD-10-CM | POA: Insufficient documentation

## 2018-09-12 DIAGNOSIS — Z9889 Other specified postprocedural states: Secondary | ICD-10-CM | POA: Insufficient documentation

## 2018-09-12 DIAGNOSIS — G894 Chronic pain syndrome: Secondary | ICD-10-CM | POA: Insufficient documentation

## 2018-09-12 DIAGNOSIS — F325 Major depressive disorder, single episode, in full remission: Secondary | ICD-10-CM | POA: Diagnosis not present

## 2018-09-12 DIAGNOSIS — Z5181 Encounter for therapeutic drug level monitoring: Secondary | ICD-10-CM | POA: Insufficient documentation

## 2018-09-12 DIAGNOSIS — Z96641 Presence of right artificial hip joint: Secondary | ICD-10-CM | POA: Diagnosis not present

## 2018-09-12 DIAGNOSIS — Z885 Allergy status to narcotic agent status: Secondary | ICD-10-CM | POA: Diagnosis not present

## 2018-09-12 DIAGNOSIS — Z888 Allergy status to other drugs, medicaments and biological substances status: Secondary | ICD-10-CM | POA: Insufficient documentation

## 2018-09-12 DIAGNOSIS — Z87891 Personal history of nicotine dependence: Secondary | ICD-10-CM | POA: Diagnosis not present

## 2018-09-12 DIAGNOSIS — M546 Pain in thoracic spine: Secondary | ICD-10-CM | POA: Diagnosis not present

## 2018-09-12 DIAGNOSIS — Z823 Family history of stroke: Secondary | ICD-10-CM | POA: Insufficient documentation

## 2018-09-12 DIAGNOSIS — Z7902 Long term (current) use of antithrombotics/antiplatelets: Secondary | ICD-10-CM | POA: Insufficient documentation

## 2018-09-12 DIAGNOSIS — M7061 Trochanteric bursitis, right hip: Secondary | ICD-10-CM | POA: Insufficient documentation

## 2018-09-12 DIAGNOSIS — Z8673 Personal history of transient ischemic attack (TIA), and cerebral infarction without residual deficits: Secondary | ICD-10-CM | POA: Insufficient documentation

## 2018-09-12 DIAGNOSIS — Z86010 Personal history of colonic polyps: Secondary | ICD-10-CM | POA: Diagnosis not present

## 2018-09-12 DIAGNOSIS — Z886 Allergy status to analgesic agent status: Secondary | ICD-10-CM | POA: Diagnosis not present

## 2018-09-12 LAB — CUP PACEART REMOTE DEVICE CHECK
Date Time Interrogation Session: 20191004200836
Implantable Pulse Generator Implant Date: 20180918

## 2018-09-12 MED ORDER — OXYCODONE-ACETAMINOPHEN 5-325 MG PO TABS: 1.0000 | ORAL_TABLET | Freq: Every day | ORAL | 0 refills | Status: DC | PRN

## 2018-09-12 NOTE — Patient Instructions (Addendum)
Rx for Oxycodone with acetaminophento last until 11/11/2018 has been escribed to your pharmacy.

## 2018-09-12 NOTE — Progress Notes (Signed)
Patient's Name: Melody Ochoa  MRN: [id number]  Referring Provider: Burnard Hawthorne, FNP  DOB: 05-Aug-1941  PCP: Burnard Hawthorne, FNP  DOS: 09/12/2018  Note by: Gillis Santa, MD  Service setting: Ambulatory outpatient  Specialty: Interventional Pain Management  Location: [facility] (AMB) Pain Management Facility    Patient type: Established   Primary Reason(s) for Visit: Encounter for prescription drug management & post-procedure evaluation of chronic illness with mild to moderate exacerbation(Level of risk: moderate) CC: Back Pain (left, lower)  HPI  Melody Ochoa is a 77 y.o. year old, female patient, who comes today for a post-procedure evaluation and medication management. She has Spondylolisthesis of lumbar region; UTI (lower urinary tract infection); Low back pain; Personal history of colonic polyps; Status post total replacement of right hip; Stroke (cerebrum) (HCC) - Patchy small volume acute ischemic nonhemorrhagic superior left cerebellar infarct, left SCA territory, of undetermined etiology, with diffuse atherosclerosis; Depression, recurrent ([place]); Pulmonary nodules; History of lumbar surgery; Atheromatous plaque; Hyperlipidemia; Rash; Lumbar radiculopathy; History of stroke; Greater trochanteric bursitis of both hips; Degeneration of lumbar or lumbosacral intervertebral disc; Major depression in remission ([place]); Primary osteoarthritis of left hip; Atherosclerosis of aorta ([place]); Cough; SOB (shortness of breath) on exertion; Dysphagia; and Chronic pain syndrome on their problem list. Her primarily concern today is the Back Pain (left, lower)  Pain Assessment: Location: Left, Lower Back Radiating: denies Onset: More than a month ago Duration: Chronic pain Quality: Burning Severity: 8 /10 (subjective, self-reported pain score)  Note: Reported level is inconsistent with clinical observations.                         When using our objective Pain Scale, levels between 6 and 10/10 are said  to belong in an emergency room, as it progressively worsens from a 6/10, described as severely limiting, requiring emergency care not usually available at an outpatient pain management facility. At a 6/10 level, communication becomes difficult and requires great effort. Assistance to reach the emergency department may be required. Facial flushing and profuse sweating along with potentially dangerous increases in heart rate and blood pressure will be evident. Effect on ADL:   Timing: Intermittent Modifying factors: rest BP: 131/76  HR: 69  Melody Ochoa was last seen on 08/22/2018 for a procedure. During today's appointment we reviewed Melody Ochoa's post-procedure results, as well as her outpatient medication regimen.  Further details on both, my assessment(s), as well as the proposed treatment plan, please see below.  Controlled Substance Pharmacotherapy Assessment REMS (Risk Evaluation and Mitigation Strategy)  Analgesic: Hydrocodone 5 mg daily to twice daily as needed MME/day: Less than 10 mg/day.  Melody Martins, RN  09/12/2018 10:55 AM  Sign at close encounter Safety precautions to be maintained throughout the outpatient stay will include: orient to surroundings, keep bed in low position, maintain call bell within reach at all times, provide assistance with transfer out of bed and ambulation.    Pharmacokinetics: Liberation and absorption (onset of action): WNL Distribution (time to peak effect): WNL Metabolism and excretion (duration of action): WNL         Pharmacodynamics: Desired effects: Analgesia: Melody Ochoa reports 50% benefit. Functional ability: Patient reports that medication allows her to accomplish basic ADLs Clinically meaningful improvement in function (CMIF): Sustained CMIF goals met Perceived effectiveness: Described as relatively effective but with some room for improvement Undesirable effects: Side-effects or Adverse reactions: None reported Monitoring:  PMP:  Online review of the past 75-month  period conducted. Compliant with practice rules and regulations Last UDS on record: Summary  Date Value Ref Range Status  02/15/2018 FINAL  Final    Comment:    ==================================================================== [facility] COMP DRUG ANALYSIS,UR ==================================================================== Test                             Result       Flag       Units Drug Present and Declared for Prescription Verification   Gabapentin                     PRESENT      EXPECTED   Bupropion                      PRESENT      EXPECTED   Hydroxybupropion               PRESENT      EXPECTED    Hydroxybupropion is an expected metabolite of bupropion.   Duloxetine                     PRESENT      EXPECTED ==================================================================== Test                      Result    Flag   Units      Ref Range   Creatinine              90               mg/dL      >=20 ==================================================================== Declared Medications:  The flagging and interpretation on this report are based on the  following declared medications.  Unexpected results may arise from  inaccuracies in the declared medications.  **Note: The testing scope of this panel includes these medications:  Bupropion (Wellbutrin)  Duloxetine (Cymbalta)  Gabapentin  **Note: The testing scope of this panel does not include following  reported medications:  Atorvastatin (Lipitor)  Calcium carbonate (Calcium carbonate/Vitamin D)  Clopidogrel (Plavix)  Cyanocobalamin  Iron (Ferrous Sulfate)  Mupirocin (Bactroban)  Triamcinolone (Kenalog)  Vitamin C  Vitamin D (Calcium carbonate/Vitamin D) ==================================================================== For clinical consultation, please call [phone]. ====================================================================    UDS interpretation: Compliant           Medication Assessment Form: Reviewed. Patient indicates being compliant with therapy Treatment compliance: Compliant Risk Assessment Profile: Aberrant behavior: See prior evaluations. None observed or detected today Comorbid factors increasing risk of overdose: See prior notes. No additional risks detected today Opioid risk tool (ORT) (Total Score):   Personal History of Substance Abuse (SUD-Substance use disorder):  Alcohol:    Illegal Drugs:    Rx Drugs:    ORT Risk Level calculation:   Risk of substance use disorder (SUD): Low  ORT Scoring interpretation table:  Score <3 = Low Risk for SUD  Score between 4-7 = Moderate Risk for SUD  Score >8 = High Risk for Opioid Abuse   Risk Mitigation Strategies:  Patient Counseling: Covered Patient-Prescriber Agreement (PPA): Present and active  Notification to other healthcare providers: Done  Pharmacologic Plan: Will change to Percocet 5 mg BID prn             Post-Procedure Assessment  08/21/2018 Procedure: Left L1-L2 ESI #2 Pre-procedure pain score:  7/10 Post-procedure pain score: 0/10         Influential Factors:  BMI: 31.24 kg/m Intra-procedural challenges: None observed.         Assessment challenges: None detected.              Reported side-effects: None.        Post-procedural adverse reactions or complications: None reported         Sedation: Please see nurses note. When no sedatives are used, the analgesic levels obtained are directly associated to the effectiveness of the local anesthetics. However, when sedation is provided, the level of analgesia obtained during the initial 1 hour following the intervention, is believed to be the result of a combination of factors. These factors may include, but are not limited to: 1. The effectiveness of the local anesthetics used. 2. The effects of the analgesic(s) and/or anxiolytic(s) used. 3. The degree of discomfort experienced by the patient at the time of the procedure. 4.  The patients ability and reliability in recalling and recording the events. 5. The presence and influence of possible secondary gains and/or psychosocial factors. Reported result: Relief experienced during the 1st hour after the procedure: 50 % (Ultra-Short Term Relief)            Interpretative annotation: Clinically appropriate result. Analgesia during this period is likely to be Local Anesthetic and/or IV Sedative (Analgesic/Anxiolytic) related.          Effects of local anesthetic: The analgesic effects attained during this period are directly associated to the localized infiltration of local anesthetics and therefore cary significant diagnostic value as to the etiological location, or anatomical origin, of the pain. Expected duration of relief is directly dependent on the pharmacodynamics of the local anesthetic used. Long-acting (4-6 hours) anesthetics used.  Reported result: Relief during the next 4 to 6 hour after the procedure: 50 % (Short-Term Relief)            Interpretative annotation: Clinically appropriate result. Analgesia during this period is likely to be Local Anesthetic-related.          Long-term benefit: Defined as the period of time past the expected duration of local anesthetics (1 hour for short-acting and 4-6 hours for long-acting). With the possible exception of prolonged sympathetic blockade from the local anesthetics, benefits during this period are typically attributed to, or associated with, other factors such as analgesic sensory neuropraxia, antiinflammatory effects, or beneficial biochemical changes provided by agents other than the local anesthetics.  Reported result: Extended relief following procedure: 75 % (Long-Term Relief)            Interpretative annotation: Clinically possible results. Good relief. No permanent benefit expected. Inflammation plays a part in the etiology to the pain.          Current benefits: Defined as reported results that persistent at this  point in time.   Analgesia: 75 %            Function: Ms. Musolino reports improvement in function ROM: Ms. Schweigert reports improvement in ROM Interpretative annotation: Ongoing benefit. Therapeutic benefit observed. Effective therapeutic approach.          Interpretation: Results would suggest a successful diagnostic and therapeutic intervention.                  Plan:  Please see "Plan of Care" for details.                Laboratory Chemistry  Inflammation Markers (CRP: Acute Phase) (ESR: Chronic Phase) No results found for: CRP, ESRSEDRATE, LATICACIDVEN  Rheumatology Markers Lab Results  Component Value Date   RF <10.0 08/25/2018                        Renal Function Markers Lab Results  Component Value Date   BUN 12 04/17/2018   CREATININE 0.84 04/17/2018   GFRAA >60 04/17/2018   GFRNONAA >60 04/17/2018                             Hepatic Function Markers Lab Results  Component Value Date   AST 19 04/17/2018   ALT 17 04/17/2018   ALBUMIN 4.2 04/17/2018   ALKPHOS 64 04/17/2018                        Electrolytes Lab Results  Component Value Date   NA 135 04/17/2018   K 3.7 04/17/2018   CL 99 (L) 04/17/2018   CALCIUM 8.8 (L) 04/17/2018                        Neuropathy Markers Lab Results  Component Value Date   VITAMINB12 334 11/23/2014   FOLATE >20.0 11/23/2014   HGBA1C 5.3 08/10/2017                        CNS Tests No results found for: COLORCSF, APPEARCSF, RBCCOUNTCSF, WBCCSF, POLYSCSF, LYMPHSCSF, EOSCSF, PROTEINCSF, GLUCCSF, JCVIRUS, CSFOLI, IGGCSF                      Bone Pathology Markers No results found for: VD25OH, VD125OH2TOT, [id number], [id number], 25OHVITD1, 25OHVITD2, 25OHVITD3, TESTOFREE, TESTOSTERONE                       Coagulation Parameters Lab Results  Component Value Date   INR 0.91 08/09/2017   LABPROT 12.2 08/09/2017   APTT 27 08/09/2017   PLT 299 04/17/2018                        Cardiovascular  Markers Lab Results  Component Value Date   TROPONINI <0.03 08/19/2017   HGB 11.6 (L) 04/17/2018   HCT 34.3 (L) 04/17/2018                         CA Markers No results found for: CEA, CA125, LABCA2                      Note: Lab results reviewed.  Recent Diagnostic Imaging Results  CUP PACEART REMOTE DEVICE CHECK Carelink summary report received. Battery status OK. Normal device function. No new symptom episodes, tachy episodes, brady, or pause episodes. No new AF episodes. Monthly summary reports and ROV with SK PRNLexie Makris RN, BSN  Complexity Note: Imaging results reviewed. Results shared with Ms. Biehn, using Layman's terms.                         Meds   Current Outpatient Medications:  .  atorvastatin (LIPITOR) 80 MG tablet, Take 1 tablet (80 mg total) daily at 6 PM by mouth., Disp: 90 tablet, Rfl: 3 .  buPROPion (WELLBUTRIN XL) 300 MG 24 hr tablet, Take 300 mg by mouth daily. , Disp: , Rfl:  .  Calcium Carbonate-Vitamin D (CALTRATE 600+D PO), Take  1 tablet by mouth daily., Disp: , Rfl:  .  clopidogrel (PLAVIX) 75 MG tablet, Take 1 tablet (75 mg total) by mouth daily., Disp: 30 tablet, Rfl: 0 .  Cyanocobalamin (VITAMIN B-12 PO), Take 1 tablet by mouth daily., Disp: , Rfl:  .  DULoxetine (CYMBALTA) 60 MG capsule, Take 60 mg by mouth 2 (two) times daily. , Disp: , Rfl:  .  ferrous sulfate 325 (65 FE) MG tablet, Take 1 tablet (325 mg total) by mouth daily., Disp: 30 tablet, Rfl: 0 .  vitamin C (ASCORBIC ACID) 500 MG tablet, Take 500 mg by mouth daily., Disp: , Rfl:  .  oxyCODONE-acetaminophen (PERCOCET) 5-325 MG tablet, Take 1-2 tablets by mouth daily as needed for severe pain., Disp: 60 tablet, Rfl: 0 .  [START ON 10/12/2018] oxyCODONE-acetaminophen (PERCOCET) 5-325 MG tablet, Take 1-2 tablets by mouth daily as needed for severe pain., Disp: 60 tablet, Rfl: 0 .  triamcinolone cream (KENALOG) 0.1 %, Apply 1 application topically as needed., Disp: , Rfl: 0  ROS   Constitutional: Denies any fever or chills Gastrointestinal: No reported hemesis, hematochezia, vomiting, or acute GI distress Musculoskeletal: Denies any acute onset joint swelling, redness, loss of ROM, or weakness Neurological: No reported episodes of acute onset apraxia, aphasia, dysarthria, agnosia, amnesia, paralysis, loss of coordination, or loss of consciousness  Allergies  Ms. Monteverde is allergic to [employer] sodium]; aspirin; celebrex [celecoxib]; effexor [venlafaxine]; gabapentin; hydrocodone-homatropine; ibuprofen; plavix [clopidogrel bisulfate]; tape; vioxx [rofecoxib]; other; and tramadol.  [place]  Drug: Ms. Harden  reports that she does not use drugs. Alcohol:  reports that she drinks alcohol. Tobacco:  reports that she quit smoking about 21 years ago. Her smoking use included cigarettes. She has a 40.00 pack-year smoking history. She has never used smokeless tobacco. Medical:  has a past medical history of Arthritis, Atheromatous plaque (10/18/2017), Back pain, Cancer ([place]), Depression, Diverticulosis, Dizziness, GERD (gastroesophageal reflux disease), Heart murmur, Hyperlipidemia (10/18/2017), PONV (postoperative nausea and vomiting), and Stroke ([place]). Surgical: Ms. Bertz  has a past surgical history that includes Tonsillectomy; Facelift (89); Cervical disc surgery (8); Back surgery (12); Colonoscopy with propofol (N/A, 01/13/2016); Total hip arthroplasty (Right, 04/22/2016); TEE without cardioversion (N/A, 08/12/2017); LOOP RECORDER INSERTION (N/A, 08/23/2017); Breast excisional biopsy (Left, 90's); and Breast biopsy (Bilateral). Family: family history includes Aneurysm in her brother; Breast cancer (age of onset: 25) in her maternal aunt; Cushing syndrome in her paternal grandmother; Dementia in her paternal grandfather; Diabetes in her maternal grandmother; Kidney disease in her maternal grandfather; Lung cancer in her father; Stroke in her brother.  Constitutional Exam   General appearance: Well nourished, well developed, and well hydrated. In no apparent acute distress Vitals:   09/12/18 1048  BP: 131/76  Pulse: 69  Resp: 16  Temp: 97.6 F (36.4 C)  TempSrc: Oral  SpO2: 97%  Weight: 182 lb (82.6 kg)  Height: '5\' 4"'$  (1.626 m)   BMI Assessment: Estimated body mass index is 31.24 kg/m as calculated from the following:   Height as of this encounter: '5\' 4"'$  (1.626 m).   Weight as of this encounter: 182 lb (82.6 kg).  BMI interpretation table: BMI level Category Range association with higher incidence of chronic pain  <18 kg/m2 Underweight   18.5-24.9 kg/m2 Ideal body weight   25-29.9 kg/m2 Overweight Increased incidence by 20%  30-34.9 kg/m2 Obese (Class I) Increased incidence by 68%  35-39.9 kg/m2 Severe obesity (Class II) Increased incidence by 136%  >40 kg/m2 Extreme obesity (Class III) Increased incidence  by 254%   Patient's current BMI Ideal Body weight  Body mass index is 31.24 kg/m. Ideal body weight: 54.7 kg (120 lb 9.5 oz) Adjusted ideal body weight: 65.8 kg (145 lb 2.5 oz)   BMI Readings from Last 4 Encounters:  09/12/18 31.24 kg/m  08/24/18 33.64 kg/m  08/21/18 31.76 kg/m  08/11/18 33.84 kg/m   Wt Readings from Last 4 Encounters:  09/12/18 182 lb (82.6 kg)  08/24/18 196 lb (88.9 kg)  08/21/18 185 lb (83.9 kg)  08/11/18 197 lb 2 oz (89.4 kg)  Psych/Mental status: Alert, oriented x 3 (person, place, & time)       Eyes: PERLA Respiratory: No evidence of acute respiratory distress  Cervical Spine Area Exam  Skin & Axial Inspection: No masses, redness, edema, swelling, or associated skin lesions Alignment: Symmetrical Functional ROM: Unrestricted ROM      Stability: No instability detected Muscle Tone/Strength: Functionally intact. No obvious neuro-muscular anomalies detected. Sensory (Neurological): Unimpaired Palpation: No palpable anomalies              Upper Extremity (UE) Exam    Side: Right upper extremity  Side:  Left upper extremity  Skin & Extremity Inspection: Skin color, temperature, and hair growth are WNL. No peripheral edema or cyanosis. No masses, redness, swelling, asymmetry, or associated skin lesions. No contractures.  Skin & Extremity Inspection: Skin color, temperature, and hair growth are WNL. No peripheral edema or cyanosis. No masses, redness, swelling, asymmetry, or associated skin lesions. No contractures.  Functional ROM: Unrestricted ROM          Functional ROM: Unrestricted ROM          Muscle Tone/Strength: Functionally intact. No obvious neuro-muscular anomalies detected.  Muscle Tone/Strength: Functionally intact. No obvious neuro-muscular anomalies detected.  Sensory (Neurological): Unimpaired          Sensory (Neurological): Unimpaired          Palpation: No palpable anomalies              Palpation: No palpable anomalies              Provocative Test(s):  Phalen's test: deferred Tinel's test: deferred Apley's scratch test (touch opposite shoulder):  Action 1 (Across chest): deferred Action 2 (Overhead): deferred Action 3 (LB reach): deferred   Provocative Test(s):  Phalen's test: deferred Tinel's test: deferred Apley's scratch test (touch opposite shoulder):  Action 1 (Across chest): deferred Action 2 (Overhead): deferred Action 3 (LB reach): deferred    Thoracic Spine Area Exam  Skin & Axial Inspection: No masses, redness, or swelling Alignment: Symmetrical Functional ROM: Unrestricted ROM Stability: No instability detected Muscle Tone/Strength: Functionally intact. No obvious neuro-muscular anomalies detected. Sensory (Neurological): Unimpaired Muscle strength & Tone: No palpable anomalies  Lumbar Spine Area Exam  Skin & Axial Inspection: No masses, redness, or swelling Alignment: Symmetrical Functional ROM: Improved after treatment       Stability: No instability detected Muscle Tone/Strength: Functionally intact. No obvious neuro-muscular anomalies  detected. Sensory (Neurological): Unimpaired Palpation: No palpable anomalies       Provocative Tests: Hyperextension/rotation test: Improved after treatment       Lumbar quadrant test (Kemp's test): Improved after treatment       Lateral bending test: Improved after treatment       Patrick's Maneuver: deferred today                   FABER test: deferred today  S-I anterior distraction/compression test: deferred today         S-I lateral compression test: deferred today         S-I Thigh-thrust test: deferred today         S-I Gaenslen's test: deferred today          Gait & Posture Assessment  Ambulation: Limited Gait: Antalgic Posture: Difficulty standing up straight, due to pain   Lower Extremity Exam    Side: Right lower extremity  Side: Left lower extremity  Stability: No instability observed          Stability: No instability observed          Skin & Extremity Inspection: Skin color, temperature, and hair growth are WNL. No peripheral edema or cyanosis. No masses, redness, swelling, asymmetry, or associated skin lesions. No contractures.  Skin & Extremity Inspection: Skin color, temperature, and hair growth are WNL. No peripheral edema or cyanosis. No masses, redness, swelling, asymmetry, or associated skin lesions. No contractures.  Functional ROM: Unrestricted ROM                  Functional ROM: Unrestricted ROM                  Muscle Tone/Strength: Functionally intact. No obvious neuro-muscular anomalies detected.  Muscle Tone/Strength: Functionally intact. No obvious neuro-muscular anomalies detected.  Sensory (Neurological): Musculoskeletal pain pattern  Sensory (Neurological): Musculoskeletal pain pattern  Palpation: No palpable anomalies  Palpation: No palpable anomalies   Assessment  Primary Diagnosis & Pertinent Problem List: The primary encounter diagnosis was Lumbar radiculopathy. Diagnoses of Chronic pain syndrome, Degeneration of lumbar or  lumbosacral intervertebral disc, History of lumbar surgery, Spondylolisthesis of lumbar region, Failed back surgical syndrome, and Bilateral thoracic back pain, unspecified chronicity were also pertinent to this visit.  Status Diagnosis  Controlled Controlled Controlled 1. Lumbar radiculopathy   2. Chronic pain syndrome   3. Degeneration of lumbar or lumbosacral intervertebral disc   4. History of lumbar surgery   5. Spondylolisthesis of lumbar region   6. Failed back surgical syndrome   7. Bilateral thoracic back pain, unspecified chronicity     Problems updated and reviewed during this visit: Problem  Chronic Pain Syndrome  History of Lumbar Surgery  General Recommendations: The pain condition that the patient suffers from is best treated with a multidisciplinary approach that involves an increase in physical activity to prevent de-conditioning and worsening of the pain cycle, as well as psychological counseling (formal and/or informal) to address the co-morbid psychological affects of pain. Treatment will often involve judicious use of pain medications and interventional procedures to decrease the pain, allowing the patient to participate in the physical activity that will ultimately produce long-lasting pain reductions. The goal of the multidisciplinary approach is to return the patient to a higher level of overall function and to restore their ability to perform activities of daily living.  77 year old female with history of CVA, on Plavix, history of lumbar spine surgery who follows up status post left L1-L2 ESI #2 who is having clinically significant pain relief after her procedure.  Patient notes approximately 75% pain relief in regards to her left back and hip pain.  We can repeat this procedure in the future should she have return or worsening of this pain.  Otherwise we will refill the patient's chronic pain medications as below.  [place] PMP checked and appropriate.  UDS  up-to-date and appropriate.  Of note we will transition from hydrocodone  to oxycodone as below.  Otherwise the patient is instructed to continue her Cymbalta 60 mg twice daily.  Plan: -Transition from hydrocodone to oxycodone as below -Can consider repeating left L1-L2 ESI (patient must stop Plavix 7 days prior to procedure) -Consider SI joint imaging in the future  Plan of Care  Pharmacotherapy (Medications Ordered): Meds ordered this encounter  Medications  . oxyCODONE-acetaminophen (PERCOCET) 5-325 MG tablet    Sig: Take 1-2 tablets by mouth daily as needed for severe pain.    Dispense:  60 tablet    Refill:  0    Do not place this medication, or any other prescription from our practice, on "Automatic Refill". Patient may have prescription filled one day early if pharmacy is closed on scheduled refill date.  Marland Kitchen oxyCODONE-acetaminophen (PERCOCET) 5-325 MG tablet    Sig: Take 1-2 tablets by mouth daily as needed for severe pain.    Dispense:  60 tablet    Refill:  0    Do not place this medication, or any other prescription from our practice, on "Automatic Refill". Patient may have prescription filled one day early if pharmacy is closed on scheduled refill date.   Time Note: Greater than 50% of the 25 minute(s) of face-to-face time spent with Ms. Anastos, was spent in counseling/coordination of care regarding: Ms. Hegwood primary cause of pain, the treatment plan, treatment alternatives, the risks and possible complications of proposed treatment, medication side effects, the opioid analgesic risks and possible complications, the results, interpretation and significance of  her recent diagnostic interventional treatment(s), the appropriate use of her medications, realistic expectations, the goals of pain management (increased in functionality), the medication agreement and the patient's responsibilities when it comes to controlled substances.  Provider-requested follow-up: Return in about  8 weeks (around 11/07/2018) for Medication Management.  Future Appointments  Date Time Provider [place]  10/11/2018 11:45 AM [facility] DEVICE REMOTES [facility]  11/07/2018  1:30 PM Gillis Santa, MD [facility] None  02/16/2019  2:00 PM O'Brien-Blaney, Bryson Corona, LPN [facility]    Primary Care Physician: Burnard Hawthorne, FNP Location: [facility] Note by: Gillis Santa, M.D Date: 09/12/2018; Time: 1:30 PM  Patient Instructions  Rx for Oxycodone with acetaminophento last until 11/11/2018 has been escribed to your pharmacy.

## 2018-09-12 NOTE — Progress Notes (Signed)
Safety precautions to be maintained throughout the outpatient stay will include: orient to surroundings, keep bed in low position, maintain call bell within reach at all times, provide assistance with transfer out of bed and ambulation.  

## 2018-09-12 NOTE — Telephone Encounter (Signed)
Abby from [facility] calling States that patient was seen at office yesterday for cough CT was reviewed by Dr. Pryor Ochoa with patient Dr. Pryor Ochoa feels a PET scan may be necessary but would like to know what Dr. Juanell Fairly thinks Please call [phone] to discuss

## 2018-09-14 ENCOUNTER — Telehealth: Payer: Self-pay | Admitting: Internal Medicine

## 2018-09-14 ENCOUNTER — Ambulatory Visit: Payer: Medicare Other | Admitting: Student in an Organized Health Care Education/Training Program

## 2018-09-14 NOTE — Telephone Encounter (Signed)
Patient calling stating she did a xray back in September and never got the results for that  Would like call back about this   Please advise

## 2018-09-15 NOTE — Telephone Encounter (Signed)
Pt advised. Nothing further needed.   

## 2018-09-15 NOTE — Telephone Encounter (Signed)
She had a CT chest that was done before her visit and was discussed with her. I thought her nodule was not really any different from the previous, and wanted to repeat it in 9 months.

## 2018-09-15 NOTE — Telephone Encounter (Signed)
Called back and left VM with my cell #.

## 2018-09-20 NOTE — Telephone Encounter (Signed)
Call pt  I reviewed chart and see all the back and forth regarding pet scan  This needs to be ordered from dr Ashby Dawes; please advise patient to cal his office since the abnormality is the lungs- best for pulmonology to follow this.   Please let me know that patient understands the importance and reasoning for continued follow up with pulmonology

## 2018-09-21 NOTE — Telephone Encounter (Signed)
Spoke with [facility]   to advise her that [name] , NP felt that pulmonology should order PET scan. ,Dr Ashby Dawes called Dr Vaught's office back and he  didn't think PET scan  was necessary at this time. He will follow up with CT Lung in 6 months. [facility] has tried to reach patient to advise, however she has been unable to reach. Follow up CT [facility] is needed in 6 months.  Called patient back to advise her regarding 6 month  follow up CT [facility] will be scheduled  patient verbalized understanding.

## 2018-09-22 ENCOUNTER — Encounter: Payer: Self-pay | Admitting: Family

## 2018-09-22 NOTE — Telephone Encounter (Signed)
Noted  I sent pt mychart

## 2018-09-25 ENCOUNTER — Telehealth: Payer: Self-pay

## 2018-09-25 ENCOUNTER — Telehealth: Payer: Self-pay | Admitting: *Deleted

## 2018-09-25 NOTE — Telephone Encounter (Signed)
Please advise 

## 2018-09-25 NOTE — Telephone Encounter (Signed)
Pain in left hip, going down front of left leg to the knee. Pain meds not helping. Do you have any other suggestions?

## 2018-09-26 ENCOUNTER — Ambulatory Visit: Payer: Medicare Other | Admitting: Student in an Organized Health Care Education/Training Program

## 2018-10-05 ENCOUNTER — Ambulatory Visit (INDEPENDENT_AMBULATORY_CARE_PROVIDER_SITE_OTHER): Payer: Medicare Other

## 2018-10-05 DIAGNOSIS — Z23 Encounter for immunization: Secondary | ICD-10-CM | POA: Diagnosis not present

## 2018-10-05 NOTE — Progress Notes (Addendum)
Pt was seen today for high dose flu shot given in LD pt tolerated well.   Agree with plan. [name], NP

## 2018-10-06 DIAGNOSIS — D3131 Benign neoplasm of right choroid: Secondary | ICD-10-CM | POA: Diagnosis not present

## 2018-10-06 DIAGNOSIS — H2513 Age-related nuclear cataract, bilateral: Secondary | ICD-10-CM | POA: Diagnosis not present

## 2018-10-11 ENCOUNTER — Ambulatory Visit (INDEPENDENT_AMBULATORY_CARE_PROVIDER_SITE_OTHER): Payer: Medicare Other | Admitting: *Deleted

## 2018-10-11 DIAGNOSIS — I63219 Cerebral infarction due to unspecified occlusion or stenosis of unspecified vertebral arteries: Secondary | ICD-10-CM

## 2018-10-12 ENCOUNTER — Telehealth: Payer: Self-pay

## 2018-10-12 NOTE — Progress Notes (Signed)
Carelink Summary Report / Loop Recorder 

## 2018-10-12 NOTE — Telephone Encounter (Signed)
Pt wants to speak to a nurse about her pain. She would not give any specifics.

## 2018-10-12 NOTE — Telephone Encounter (Signed)
Spoke with Melody Ochoa and she states that she is having really bad pain and she feels that she needs additional medication that what she is prescribed is just not enough.  She states that she just doesn't understand why the pain management clinic is not managing her pain.  Offered her an appt to come in and talk with Dr Holley Raring about this and she would like to do that.

## 2018-10-23 ENCOUNTER — Encounter: Payer: Self-pay | Admitting: Nurse Practitioner

## 2018-10-23 ENCOUNTER — Ambulatory Visit (INDEPENDENT_AMBULATORY_CARE_PROVIDER_SITE_OTHER): Payer: Medicare Other | Admitting: Family Medicine

## 2018-10-23 ENCOUNTER — Ambulatory Visit: Payer: Medicare Other | Attending: Nurse Practitioner | Admitting: Nurse Practitioner

## 2018-10-23 ENCOUNTER — Other Ambulatory Visit: Payer: Self-pay

## 2018-10-23 ENCOUNTER — Telehealth: Payer: Self-pay

## 2018-10-23 ENCOUNTER — Encounter: Payer: Self-pay | Admitting: Family Medicine

## 2018-10-23 VITALS — BP 142/80 | HR 100 | Temp 97.4°F | Ht 64.0 in | Wt 195.2 lb

## 2018-10-23 VITALS — BP 132/73 | HR 107 | Temp 98.2°F | Resp 18 | Ht 64.0 in | Wt 195.0 lb

## 2018-10-23 DIAGNOSIS — Z87891 Personal history of nicotine dependence: Secondary | ICD-10-CM | POA: Insufficient documentation

## 2018-10-23 DIAGNOSIS — Z7902 Long term (current) use of antithrombotics/antiplatelets: Secondary | ICD-10-CM | POA: Diagnosis not present

## 2018-10-23 DIAGNOSIS — R21 Rash and other nonspecific skin eruption: Secondary | ICD-10-CM | POA: Insufficient documentation

## 2018-10-23 DIAGNOSIS — E538 Deficiency of other specified B group vitamins: Secondary | ICD-10-CM | POA: Insufficient documentation

## 2018-10-23 DIAGNOSIS — R05 Cough: Secondary | ICD-10-CM | POA: Diagnosis not present

## 2018-10-23 DIAGNOSIS — Z5181 Encounter for therapeutic drug level monitoring: Secondary | ICD-10-CM | POA: Diagnosis not present

## 2018-10-23 DIAGNOSIS — Z96641 Presence of right artificial hip joint: Secondary | ICD-10-CM | POA: Insufficient documentation

## 2018-10-23 DIAGNOSIS — Z79899 Other long term (current) drug therapy: Secondary | ICD-10-CM | POA: Insufficient documentation

## 2018-10-23 DIAGNOSIS — M8580 Other specified disorders of bone density and structure, unspecified site: Secondary | ICD-10-CM | POA: Diagnosis not present

## 2018-10-23 DIAGNOSIS — M549 Dorsalgia, unspecified: Secondary | ICD-10-CM | POA: Insufficient documentation

## 2018-10-23 DIAGNOSIS — E785 Hyperlipidemia, unspecified: Secondary | ICD-10-CM | POA: Diagnosis not present

## 2018-10-23 DIAGNOSIS — I700 Atherosclerosis of aorta: Secondary | ICD-10-CM | POA: Diagnosis not present

## 2018-10-23 DIAGNOSIS — Z888 Allergy status to other drugs, medicaments and biological substances status: Secondary | ICD-10-CM | POA: Diagnosis not present

## 2018-10-23 DIAGNOSIS — R509 Fever, unspecified: Secondary | ICD-10-CM

## 2018-10-23 DIAGNOSIS — R058 Other specified cough: Secondary | ICD-10-CM

## 2018-10-23 DIAGNOSIS — Z886 Allergy status to analgesic agent status: Secondary | ICD-10-CM | POA: Diagnosis not present

## 2018-10-23 DIAGNOSIS — M4316 Spondylolisthesis, lumbar region: Secondary | ICD-10-CM | POA: Insufficient documentation

## 2018-10-23 DIAGNOSIS — F419 Anxiety disorder, unspecified: Secondary | ICD-10-CM | POA: Insufficient documentation

## 2018-10-23 DIAGNOSIS — G894 Chronic pain syndrome: Secondary | ICD-10-CM | POA: Diagnosis not present

## 2018-10-23 DIAGNOSIS — Z885 Allergy status to narcotic agent status: Secondary | ICD-10-CM | POA: Diagnosis not present

## 2018-10-23 DIAGNOSIS — M1612 Unilateral primary osteoarthritis, left hip: Secondary | ICD-10-CM | POA: Diagnosis not present

## 2018-10-23 DIAGNOSIS — J8410 Pulmonary fibrosis, unspecified: Secondary | ICD-10-CM

## 2018-10-23 MED ORDER — BENZONATATE 100 MG PO CAPS: 100.0000 mg | ORAL_CAPSULE | Freq: Three times a day (TID) | ORAL | 0 refills | Status: DC | PRN

## 2018-10-23 MED ORDER — OXYCODONE-ACETAMINOPHEN 5-325 MG PO TABS: 1.0000 | ORAL_TABLET | Freq: Every day | ORAL | 0 refills | Status: DC | PRN

## 2018-10-23 MED ORDER — PREDNISONE 10 MG PO TABS: 10.0000 mg | ORAL_TABLET | Freq: Every day | ORAL | 0 refills | Status: DC

## 2018-10-23 MED ORDER — DOXYCYCLINE HYCLATE 100 MG PO TABS: 100.0000 mg | ORAL_TABLET | Freq: Two times a day (BID) | ORAL | 0 refills | Status: DC

## 2018-10-23 MED ORDER — ALBUTEROL SULFATE HFA 108 (90 BASE) MCG/ACT IN AERS: 2.0000 | INHALATION_SPRAY | Freq: Four times a day (QID) | RESPIRATORY_TRACT | 0 refills | Status: DC | PRN

## 2018-10-23 NOTE — Progress Notes (Signed)
Patient's Name: Melody Ochoa  MRN: [id number]  Referring Provider: Burnard Hawthorne, FNP  DOB: 1941/01/18  PCP: Burnard Hawthorne, FNP  DOS: 10/23/2018  Note by: Vevelyn Francois NP  Service setting: Ambulatory outpatient  Specialty: Interventional Pain Management  Location: [facility] (AMB) Pain Management Facility    Patient type: Established    Primary Reason(s) for Visit: Encounter for prescription drug management. (Level of risk: moderate)  CC: Back Pain (left)  HPI  Melody Ochoa is a 77 y.o. year old, female patient, who comes today for a medication management evaluation. She has Spondylolisthesis of lumbar region; UTI (lower urinary tract infection); Low back pain; Personal history of colonic polyps; Status post total replacement of right hip; Stroke (cerebrum) (HCC) - Patchy small volume acute ischemic nonhemorrhagic superior left cerebellar infarct, left SCA territory, of undetermined etiology, with diffuse atherosclerosis; Depression, recurrent ([place]); Pulmonary nodules; History of lumbar surgery; Osteopenia; Hyperlipidemia; Rash; Lumbar radiculopathy; History of stroke; Greater trochanteric bursitis of both hips; Degeneration of lumbar or lumbosacral intervertebral disc; Major depression in remission ([place]); Primary osteoarthritis of left hip; Thoracic aortic atherosclerosis ([place]); Cough; SOB (shortness of breath) on exertion; Dysphagia; Chronic pain syndrome; Anxiety; B12 deficiency; Generalized OA; Left hip pain; Menopause; Prediabetes; and Strain of left hip on their problem list. Her primarily concern today is the Back Pain (left)  Pain Assessment: Location: Left, Lower Back Radiating: front of left upper leg Onset: More than a month ago Duration: Chronic pain Quality: Burning, Aching Severity: 7 /10 (subjective, self-reported pain score)  Note: Reported level is compatible with observation. Clinically the patient looks like a 2/10 A 2/10 is viewed as "Mild to Moderate" and described  as noticeable and distracting. Impossible to hide from other people. More frequent flare-ups. Still possible to adapt and function close to normal. It can be very annoying and may have occasional stronger flare-ups. With discipline, patients may get used to it and adapt.       When using our objective Pain Scale, levels between 6 and 10/10 are said to belong in an emergency room, as it progressively worsens from a 6/10, described as severely limiting, requiring emergency care not usually available at an outpatient pain management facility. At a 6/10 level, communication becomes difficult and requires great effort. Assistance to reach the emergency department may be required. Facial flushing and profuse sweating along with potentially dangerous increases in heart rate and blood pressure will be evident. Effect on ADL:   Timing: Intermittent Modifying factors: lying down BP: 132/73  HR: (!) 107  Melody Ochoa in for medication management. During today's appointment we reviewed Melody Ochoa's chronic pain status, as well as her outpatient medication regimen.  Patient in today for early refill however she has 1 additional refill at the pharmacy.  She was not aware of this.  She admits that she is using 2 tablets/day but she uses them sparingly.  She admits that she has an addictive personality and does not want to overuse his medications.  She also admits that her pain is not being managed.  She admits that the interventional therapy has not been effective for more than 2 to 3 days.  She states that she was told that surgery at this time is not an option.  She is status post right hip replacement which was very effective.  She admits that physical therapy was discontinued by her.  She admits that she normally swims 5 times per week but has not been swimming in a week.  The patient  reports that she does not use drugs. Her body mass index is 33.47 kg/m.  Further details on both, my assessment(s), as well as  the proposed treatment plan, please see below.  Controlled Substance Pharmacotherapy Assessment REMS (Risk Evaluation and Mitigation Strategy)  Analgesic: Oxycodone-acetaminophen 5/325 1 to 2 tablets daily oxycodone 10 mg/day MME/day: 15 mg/day.  Landis Martins, RN  10/23/2018  2:23 PM  Sign at close encounter Nursing Pain Medication Assessment:  Safety precautions to be maintained throughout the outpatient stay will include: orient to surroundings, keep bed in low position, maintain call bell within reach at all times, provide assistance with transfer out of bed and ambulation.  Medication Inspection Compliance: Melody Ochoa did not comply with our request to bring her pills to be counted. She was reminded that bringing the medication bottles, even when empty, is a requirement.  Medication: None brought in. Pill/Patch Count: None available to be counted. Bottle Appearance: No container available. Did not bring bottle(s) to appointment. Filled Date: N/A Last Medication intake:  Today   Pharmacokinetics: Liberation and absorption (onset of action): WNL Distribution (time to peak effect): WNL Metabolism and excretion (duration of action): WNL         Pharmacodynamics: Desired effects: Analgesia: Melody Ochoa reports >50% benefit. Functional ability: Patient reports that medication allows her to accomplish basic ADLs Clinically meaningful improvement in function (CMIF): Sustained CMIF goals met Perceived effectiveness: Described as relatively effective, allowing for increase in activities of daily living (ADL) Undesirable effects: Side-effects or Adverse reactions: None reported Monitoring: [place] PMP: Online review of the past 43-monthperiod conducted. Compliant with practice rules and regulations Last UDS on record: Summary  Date Value Ref Range Status  02/15/2018 FINAL  Final    Comment:    ==================================================================== TOXASSURE COMP DRUG  ANALYSIS,UR ==================================================================== Test                             Result       Flag       Units Drug Present and Declared for Prescription Verification   Gabapentin                     PRESENT      EXPECTED   Bupropion                      PRESENT      EXPECTED   Hydroxybupropion               PRESENT      EXPECTED    Hydroxybupropion is an expected metabolite of bupropion.   Duloxetine                     PRESENT      EXPECTED ==================================================================== Test                      Result    Flag   Units      Ref Range   Creatinine              90               mg/dL      >=20 ==================================================================== Declared Medications:  The flagging and interpretation on this report are based on the  following declared medications.  Unexpected results may arise from  inaccuracies in the declared medications.  **Note: The testing  scope of this panel includes these medications:  Bupropion (Wellbutrin)  Duloxetine (Cymbalta)  Gabapentin  **Note: The testing scope of this panel does not include following  reported medications:  Atorvastatin (Lipitor)  Calcium carbonate (Calcium carbonate/Vitamin D)  Clopidogrel (Plavix)  Cyanocobalamin  Iron (Ferrous Sulfate)  Mupirocin (Bactroban)  Triamcinolone (Kenalog)  Vitamin C  Vitamin D (Calcium carbonate/Vitamin D) ==================================================================== For clinical consultation, please call [phone]. ====================================================================    UDS interpretation: Compliant          Medication Assessment Form: Reviewed. Patient indicates being compliant with therapy Treatment compliance: Compliant Risk Assessment Profile: Aberrant behavior: See prior evaluations. None observed or detected today Comorbid factors increasing risk of overdose: See prior notes.  No additional risks detected today Opioid risk tool (ORT) (Total Score): 4 Personal History of Substance Abuse (SUD-Substance use disorder):  Alcohol: Negative  Illegal Drugs: Negative  Rx Drugs: Negative  ORT Risk Level calculation: Moderate Risk Risk of substance use disorder (SUD): Low Opioid Risk Tool - 10/23/18 1418      Family History of Substance Abuse   Alcohol  Positive Female    Illegal Drugs  Negative    Rx Drugs  Negative      Personal History of Substance Abuse   Alcohol  Negative    Illegal Drugs  Negative    Rx Drugs  Negative      Age   Age between 94-45 years   No      History of Preadolescent Sexual Abuse   History of Preadolescent Sexual Abuse  Negative or Female      Psychological Disease   Psychological Disease  Negative    Depression  Positive      Total Score   Opioid Risk Tool Scoring  4    Opioid Risk Interpretation  Moderate Risk      ORT Scoring interpretation table:  Score <3 = Low Risk for SUD  Score between 4-7 = Moderate Risk for SUD  Score >8 = High Risk for Opioid Abuse   Risk Mitigation Strategies:  Patient Counseling: Covered Patient-Prescriber Agreement (PPA): Present and active  Notification to other healthcare providers: Done  Pharmacologic Plan: No change in therapy, at this time.             Laboratory Chemistry  Inflammation Markers (CRP: Acute Phase) (ESR: Chronic Phase) No results found for: CRP, ESRSEDRATE, LATICACIDVEN                       Rheumatology Markers Lab Results  Component Value Date   RF <10.0 08/25/2018                        Renal Function Markers Lab Results  Component Value Date   BUN 12 04/17/2018   CREATININE 0.84 04/17/2018   GFRAA >60 04/17/2018   GFRNONAA >60 04/17/2018                             Hepatic Function Markers Lab Results  Component Value Date   AST 19 04/17/2018   ALT 17 04/17/2018   ALBUMIN 4.2 04/17/2018   ALKPHOS 64 04/17/2018                         Electrolytes Lab Results  Component Value Date   NA 135 04/17/2018   K 3.7 04/17/2018   CL 99 (L) 04/17/2018  CALCIUM 8.8 (L) 04/17/2018                        Neuropathy Markers Lab Results  Component Value Date   VITAMINB12 334 11/23/2014   FOLATE >20.0 11/23/2014   HGBA1C 5.3 08/10/2017                        CNS Tests No results found for: COLORCSF, APPEARCSF, RBCCOUNTCSF, WBCCSF, POLYSCSF, LYMPHSCSF, EOSCSF, PROTEINCSF, GLUCCSF, JCVIRUS, CSFOLI, IGGCSF                      Bone Pathology Markers No results found for: VD25OH, [id number], [id number], [id number], 25OHVITD1, 25OHVITD2, 25OHVITD3, TESTOFREE, TESTOSTERONE                       Coagulation Parameters Lab Results  Component Value Date   INR 0.91 08/09/2017   LABPROT 12.2 08/09/2017   APTT 27 08/09/2017   PLT 299 04/17/2018                        Cardiovascular Markers Lab Results  Component Value Date   TROPONINI <0.03 08/19/2017   HGB 11.6 (L) 04/17/2018   HCT 34.3 (L) 04/17/2018                         CA Markers No results found for: CEA, CA125, LABCA2                      Note: Lab results reviewed.  Recent Diagnostic Imaging Results  CUP PACEART REMOTE DEVICE CHECK Carelink summary report received. Battery status OK. Normal device function. No new symptom episodes, tachy episodes, brady, or pause episodes. No new AF episodes. Monthly summary reports and ROV with SK PRNLexie Makris RN, BSN  Complexity Note: Imaging results reviewed. Results shared with Melody Ochoa, using Layman's terms.                         Meds   Current Outpatient Medications:  .  albuterol (PROVENTIL HFA;VENTOLIN HFA) 108 (90 Base) MCG/ACT inhaler, Inhale 2 puffs into the lungs every 6 (six) hours as needed for wheezing or shortness of breath., Disp: 1 Inhaler, Rfl: 0 .  atorvastatin (LIPITOR) 80 MG tablet, Take 1 tablet (80 mg total) daily at 6 PM by mouth., Disp: 90 tablet, Rfl: 3 .  benzonatate (TESSALON) 100 MG  capsule, Take 1 capsule (100 mg total) by mouth 3 (three) times daily as needed for cough., Disp: 30 capsule, Rfl: 0 .  buPROPion (WELLBUTRIN XL) 300 MG 24 hr tablet, Take 300 mg by mouth daily. , Disp: , Rfl:  .  Calcium Carbonate-Vitamin D (CALTRATE 600+D PO), Take 1 tablet by mouth daily., Disp: , Rfl:  .  clopidogrel (PLAVIX) 75 MG tablet, Take 1 tablet (75 mg total) by mouth daily., Disp: 30 tablet, Rfl: 0 .  Cyanocobalamin (VITAMIN B-12 PO), Take 1 tablet by mouth daily., Disp: , Rfl:  .  doxycycline (VIBRA-TABS) 100 MG tablet, Take 1 tablet (100 mg total) by mouth 2 (two) times daily., Disp: 20 tablet, Rfl: 0 .  DULoxetine (CYMBALTA) 60 MG capsule, Take 60 mg by mouth 2 (two) times daily. , Disp: , Rfl:  .  ferrous sulfate 325 (65 FE) MG tablet, Take 1 tablet (325 mg total) by mouth  daily., Disp: 30 tablet, Rfl: 0 .  [START ON 11/22/2018] oxyCODONE-acetaminophen (PERCOCET) 5-325 MG tablet, Take 1-2 tablets by mouth daily as needed for severe pain., Disp: 60 tablet, Rfl: 0 .  predniSONE (DELTASONE) 10 MG tablet, Take 1 tablet (10 mg total) by mouth daily with breakfast., Disp: 5 tablet, Rfl: 0 .  triamcinolone cream (KENALOG) 0.1 %, Apply 1 application topically as needed., Disp: , Rfl: 0 .  vitamin C (ASCORBIC ACID) 500 MG tablet, Take 500 mg by mouth daily., Disp: , Rfl:  .  [START ON 12/22/2018] oxyCODONE-acetaminophen (PERCOCET) 5-325 MG tablet, Take 1-2 tablets by mouth daily as needed for severe pain., Disp: 60 tablet, Rfl: 0  ROS  Constitutional: Denies any fever or chills Gastrointestinal: No reported hemesis, hematochezia, vomiting, or acute GI distress Musculoskeletal: Denies any acute onset joint swelling, redness, loss of ROM, or weakness Neurological: No reported episodes of acute onset apraxia, aphasia, dysarthria, agnosia, amnesia, paralysis, loss of coordination, or loss of consciousness  Allergies  Melody Ochoa is allergic to [employer] sodium]; aspirin; celebrex  [celecoxib]; effexor [venlafaxine]; gabapentin; hydrocodone-homatropine; ibuprofen; tape; vioxx [rofecoxib]; other; and tramadol.  [place]  Drug: Melody Ochoa  reports that she does not use drugs. Alcohol:  reports that she drinks alcohol. Tobacco:  reports that she quit smoking about 21 years ago. Her smoking use included cigarettes. She has a 40.00 pack-year smoking history. She has never used smokeless tobacco. Medical:  has a past medical history of Arthritis, Atheromatous plaque (10/18/2017), Back pain, Cancer ([place]), Depression, Diverticulosis, Dizziness, GERD (gastroesophageal reflux disease), Heart murmur, Hyperlipidemia (10/18/2017), PONV (postoperative nausea and vomiting), and Stroke ([place]). Surgical: Melody Ochoa  has a past surgical history that includes Tonsillectomy; Facelift (89); Cervical disc surgery (8); Back surgery (12); Colonoscopy with propofol (N/A, 01/13/2016); Total hip arthroplasty (Right, 04/22/2016); TEE without cardioversion (N/A, 08/12/2017); LOOP RECORDER INSERTION (N/A, 08/23/2017); Breast excisional biopsy (Left, 90's); and Breast biopsy (Bilateral). Family: family history includes Aneurysm in her brother; Breast cancer (age of onset: 53) in her maternal aunt; Cushing syndrome in her paternal grandmother; Dementia in her paternal grandfather; Diabetes in her maternal grandmother; Kidney disease in her maternal grandfather; Lung cancer in her father; Stroke in her brother.  Constitutional Exam  General appearance: Well nourished, well developed, and well hydrated. In no apparent acute distress Vitals:   10/23/18 1419  BP: 132/73  Pulse: (!) 107  Resp: 18  Temp: 98.2 F (36.8 C)  TempSrc: Oral  SpO2: 94%  Weight: 195 lb (88.5 kg)  Height: '5\' 4"'$  (1.626 m)  Psych/Mental status: Alert, oriented x 3 (person, place, & time)       Eyes: PERLA Respiratory: No evidence of acute respiratory distress  Lumbar Spine Area Exam  Skin & Axial Inspection: No masses, redness, or  swelling Alignment: Symmetrical Functional ROM: Unrestricted ROM       Stability: No instability detected Muscle Tone/Strength: Functionally intact. No obvious neuro-muscular anomalies detected. Sensory (Neurological): Unimpaired Palpation: Complains of area being tender to palpation       Provocative Tests: Hyperextension/rotation test: deferred today       Lumbar quadrant test (Kemp's test): deferred today       Lateral bending test: deferred today       Patrick's Maneuver: Unable to perform                    Gait & Posture Assessment  Ambulation: Patient ambulates using a cane Gait: Antalgic gait (limping) Posture: Antalgic   Lower  Extremity Exam    Side: Right lower extremity  Side: Left lower extremity  Stability: No instability observed          Stability: No instability observed          Skin & Extremity Inspection: Skin color, temperature, and hair growth are WNL. No peripheral edema or cyanosis. No masses, redness, swelling, asymmetry, or associated skin lesions. No contractures.  Skin & Extremity Inspection: Skin color, temperature, and hair growth are WNL. No peripheral edema or cyanosis. No masses, redness, swelling, asymmetry, or associated skin lesions. No contractures.  Functional ROM: Unrestricted ROM                  Functional ROM: Unrestricted ROM                  Muscle Tone/Strength: Functionally intact. No obvious neuro-muscular anomalies detected.  Muscle Tone/Strength: Functionally intact. No obvious neuro-muscular anomalies detected.          Palpation: No palpable anomalies  Palpation: Tender   Assessment  Primary Diagnosis & Pertinent Problem List: The primary encounter diagnosis was Primary osteoarthritis of left hip. Diagnoses of Spondylolisthesis of lumbar region and Chronic pain syndrome were also pertinent to this visit.  Status Diagnosis  Persistent Persistent Persistent 1. Primary osteoarthritis of left hip   2. Spondylolisthesis of lumbar  region   3. Chronic pain syndrome     Problems updated and reviewed during this visit: Problem  Strain of Left Hip  B12 Deficiency   Overview:  2 im and then po as of 1-18 and follow up 4 months later  Last Assessment & Plan:  On po b12 now and following    Generalized Oa   Last Assessment & Plan:  Arthralgia's are doing about the same.    Status Post Total Replacement of Right Hip  Prediabetes   Last Assessment & Plan:  Following with diet and exercise   Thoracic Aortic Atherosclerosis (Hcc)   Overview:  On cxr 7-16  Last Assessment & Plan:  No cp or back pain is noted   Left Hip Pain  Osteopenia   Last Assessment & Plan:  [place] out of bed twice, has moved her bed against the wall.    Anxiety  Menopause   Plan of Care  Pharmacotherapy (Medications Ordered): Meds ordered this encounter  Medications  . oxyCODONE-acetaminophen (PERCOCET) 5-325 MG tablet    Sig: Take 1-2 tablets by mouth daily as needed for severe pain.    Dispense:  60 tablet    Refill:  0    Do not place this medication, or any other prescription from our practice, on "Automatic Refill". Patient may have prescription filled one day early if pharmacy is closed on scheduled refill date.    Order Specific Question:   Supervising Provider    Answer:   Milinda Pointer [phone]  . oxyCODONE-acetaminophen (PERCOCET) 5-325 MG tablet    Sig: Take 1-2 tablets by mouth daily as needed for severe pain.    Dispense:  60 tablet    Refill:  0    Do not place this medication, or any other prescription from our practice, on "Automatic Refill". Patient may have prescription filled one day early if pharmacy is closed on scheduled refill date.    Order Specific Question:   Supervising Provider    Answer:   Milinda Pointer [[id number]]   New Prescriptions   No medications on file   Medications administered today: Melody Ochoa had no  medications administered during this visit. Lab-work, procedure(s),  and/or referral(s): No orders of the defined types were placed in this encounter.  Imaging and/or referral(s): None  Interventional therapies: Planned, scheduled, and/or pending:   Not at this time.     Provider-requested follow-up: Return in about 3 months (around 01/09/2019) for MedMgmt.  Future Appointments  Date Time Provider [place]  11/13/2018  9:40 AM [facility] DEVICE REMOTES [facility]  01/09/2019  1:45 PM Vevelyn Francois, NP [facility] None  02/16/2019  2:00 PM O'Brien-Blaney, Bryson Corona, LPN [facility]   Primary Care Physician: Burnard Hawthorne, FNP Location: [facility] Outpatient [facility] Management Facility Note by: Vevelyn Francois NP Date: 10/23/2018; Time: 3:28 PM  Pain Score Disclaimer: We use the NRS-11 scale. This is a self-reported, subjective measurement of pain severity with only modest accuracy. It is used primarily to identify changes within a particular patient. It must be understood that outpatient pain scales are significantly less accurate that those used for research, where they can be applied under ideal controlled circumstances with minimal exposure to variables. In reality, the score is likely to be a combination of pain intensity and pain affect, where pain affect describes the degree of emotional arousal or changes in action readiness caused by the sensory experience of pain. Factors such as social and work situation, setting, emotional state, anxiety levels, expectation, and prior pain experience may influence pain perception and show large inter-individual differences that may also be affected by time variables.  Patient instructions provided during this appointment: Patient Instructions   ____________________________________________________________________________________________  Medication Rules  Applies to: All patients receiving prescriptions (written or electronic).  Pharmacy of record: Pharmacy where electronic prescriptions  will be sent. If written prescriptions are taken to a different pharmacy, please inform the nursing staff. The pharmacy listed in the electronic medical record should be the one where you would like electronic prescriptions to be sent.  Prescription refills: Only during scheduled appointments. Applies to both, written and electronic prescriptions.  NOTE: The following applies primarily to controlled substances (Opioid* Pain Medications).   Patient's responsibilities: 1. Pain Pills: Bring all pain pills to every appointment (except for procedure appointments). 2. Pill Bottles: Bring pills in original pharmacy bottle. Always bring newest bottle. Bring bottle, even if empty. 3. Medication refills: You are responsible for knowing and keeping track of what medications you need refilled. The day before your appointment, write a list of all prescriptions that need to be refilled. Bring that list to your appointment and give it to the admitting nurse. Prescriptions will be written only during appointments. If you forget a medication, it will not be "Called in", "Faxed", or "electronically sent". You will need to get another appointment to get these prescribed. 4. Prescription Accuracy: You are responsible for carefully inspecting your prescriptions before leaving our office. Have the discharge nurse carefully go over each prescription with you, before taking them home. Make sure that your name is accurately spelled, that your address is correct. Check the name and dose of your medication to make sure it is accurate. Check the number of pills, and the written instructions to make sure they are clear and accurate. Make sure that you are given enough medication to last until your next medication refill appointment. 5. Taking Medication: Take medication as prescribed. Never take more pills than instructed. Never take medication more frequently than prescribed. Taking less pills or less frequently is permitted and  encouraged, when it comes to controlled substances (written prescriptions).  6.  Inform other Doctors: Always inform, all of your healthcare providers, of all the medications you take. 7. Pain Medication from other Providers: You are not allowed to accept any additional pain medication from any other Doctor or Healthcare provider. There are two exceptions to this rule. (see below) In the event that you require additional pain medication, you are responsible for notifying us, as stated below. 8. Medication Agreement: You are responsible for carefully reading and following our Medication Agreement. This must be signed before receiving any prescriptions from our practice. Safely store a copy of your signed Agreement. Violations to the Agreement will result in no further prescriptions. (Additional copies of our Medication Agreement are available upon request.) 9. Laws, Rules, & Regulations: All patients are expected to follow all Federal and [employer], [employer], Rules, [facility]. Ignorance of the Laws does not constitute a valid excuse. The use of any illegal substances is prohibited. 10. Adopted CDC guidelines & recommendations: Target dosing levels will be at or below 60 MME/day. Use of benzodiazepines** is not recommended.  Exceptions: There are only two exceptions to the rule of not receiving pain medications from other Healthcare Providers. 1. Exception #1 (Emergencies): In the event of an emergency (i.e.: accident requiring emergency care), you are allowed to receive additional pain medication. However, you are responsible for: As soon as you are able, call our office [phone], at any time of the day or night, and leave a message stating your name, the date and nature of the emergency, and the name and dose of the medication prescribed. In the event that your call is answered by a member of our staff, make sure to document and save the date, time, and the name of the person that took your  information.  2. Exception #2 (Planned Surgery): In the event that you are scheduled by another doctor or dentist to have any type of surgery or procedure, you are allowed (for a period no longer than 30 days), to receive additional pain medication, for the acute post-op pain. However, in this case, you are responsible for picking up a copy of our "Post-op Pain Management for Surgeons" handout, and giving it to your surgeon or dentist. This document is available at our office, and does not require an appointment to obtain it. Simply go to our office during business hours (Monday-Thursday from 8:00 AM to 4:00 PM) (Friday 8:00 AM to 12:00 Noon) or if you have a scheduled appointment with [place], prior to your surgery, and ask for it by name. In addition, you will need to provide [place] with your name, name of your surgeon, type of surgery, and date of procedure or surgery.  *Opioid medications include: morphine, codeine, oxycodone, oxymorphone, hydrocodone, hydromorphone, meperidine, tramadol, tapentadol, buprenorphine, fentanyl, methadone. **Benzodiazepine medications include: diazepam (Valium), alprazolam (Xanax), clonazepam (Klonopine), lorazepam (Ativan), clorazepate (Tranxene), chlordiazepoxide (Librium), estazolam (Prosom), oxazepam (Serax), temazepam (Restoril), triazolam (Halcion) (Last updated: 02/02/2018) ____________________________________________________________________________________________    BMI Assessment: Estimated body mass index is 33.47 kg/m as calculated from the following:   Height as of this encounter: '5\' 4"'$  (1.626 m).   Weight as of this encounter: 195 lb (88.5 kg).  BMI interpretation table: BMI level Category Range association with higher incidence of chronic pain  <18 kg/m2 Underweight   18.5-24.9 kg/m2 Ideal body weight   25-29.9 kg/m2 Overweight Increased incidence by 20%  30-34.9 kg/m2 Obese (Class I) Increased incidence by 68%  35-39.9 kg/m2 Severe obesity (Class II)  Increased incidence by 136%  >40 kg/m2 Extreme  obesity (Class III) Increased incidence by 254%   Patient's current BMI Ideal Body weight  Body mass index is 33.47 kg/m. Ideal body weight: 54.7 kg (120 lb 9.5 oz) Adjusted ideal body weight: 68.2 kg (150 lb 5.7 oz)   BMI Readings from Last 4 Encounters:  10/23/18 33.47 kg/m  10/23/18 33.51 kg/m  09/12/18 31.24 kg/m  08/24/18 33.64 kg/m   Wt Readings from Last 4 Encounters:  10/23/18 195 lb (88.5 kg)  10/23/18 195 lb 3.2 oz (88.5 kg)  09/12/18 182 lb (82.6 kg)  08/24/18 196 lb (88.9 kg)

## 2018-10-23 NOTE — Progress Notes (Signed)
Subjective:    Patient ID: Melody Ochoa, female    DOB: [birth year], 77 y.o.   MRN: [id number]  HPI  Presents to clinic for cough, chest congestion for 2+ weeks, getting worse.  Patient has known pulmonary fibrosis and UIP, follows regularly with pulmonology.  Patient has been coughing up some yellow phlegm and did have a fever over the weekend.  Patient denies wheezing, but does feel like her chest is congested and as if she cannot get all the phlegm to come up.   Flu shot and pneumonia vaccines UTD  Patient Active Problem List   Diagnosis Date Noted  . Chronic pain syndrome 09/12/2018  . SOB (shortness of breath) on exertion 08/11/2018  . Dysphagia 08/11/2018  . Cough 06/30/2018  . Atherosclerosis of aorta ([place]) 03/31/2018  . Rash 01/16/2018  . Atheromatous plaque 10/18/2017  . Hyperlipidemia 10/18/2017  . History of lumbar surgery 10/04/2017  . Depression, recurrent ([place]) 09/27/2017  . Pulmonary nodules 09/27/2017  . History of stroke 09/12/2017  . Stroke (cerebrum) ([facility]) - Patchy small volume acute ischemic nonhemorrhagic superior left cerebellar infarct, left SCA territory, of undetermined etiology, with diffuse atherosclerosis 08/10/2017  . Primary osteoarthritis of left hip 02/28/2017  . Status post total replacement of right hip 04/22/2016  . Personal history of colonic polyps   . Greater trochanteric bursitis of both hips 01/09/2016  . UTI (lower urinary tract infection) 11/22/2014  . Low back pain 11/22/2014  . Spondylolisthesis of lumbar region 11/18/2014  . Lumbar radiculopathy 08/21/2014  . Degeneration of lumbar or lumbosacral intervertebral disc 08/21/2014  . Major depression in remission ([place]) 06/23/2014   Social History   Tobacco Use  . Smoking status: Former Smoker    Packs/day: 1.00    Years: 40.00    Pack years: 40.00    Types: Cigarettes    Last attempt to quit: 11/13/1996    Years since quitting: 21.9  . Smokeless tobacco: Never Used    Substance Use Topics  . Alcohol use: Yes    Comment: occ wine   Review of Systems   Constitutional: +fatigue. +fever HENT: +post nasal drip Eyes: Negative.   Respiratory: +cough, chest congestion  Cardiovascular: Negative for chest pain, palpitations and leg swelling.  Gastrointestinal: Negative for abdominal pain, diarrhea, nausea and vomiting.  Genitourinary: Negative for dysuria, frequency and urgency.  Musculoskeletal: Negative for arthralgias and myalgias.  Skin: Negative for color change, pallor and rash.  Neurological: Negative for syncope, light-headedness and headaches.  Psychiatric/Behavioral: The patient is not nervous/anxious.       Objective:   Physical Exam  Constitutional: She is oriented to person, place, and time. No distress.  HENT:  Head: Normocephalic and atraumatic.  +post nasal drip  Eyes: Conjunctivae and EOM are normal. No scleral icterus.  Neck: Neck supple. No tracheal deviation present.  Cardiovascular: Normal rate and regular rhythm.  Pulmonary/Chest: Effort normal. No respiratory distress.  +scattered rhonchi and wheezes  Musculoskeletal: She exhibits no edema.  Walks with cane  Neurological: She is alert and oriented to person, place, and time.  Skin: Skin is warm and dry. She is not diaphoretic. No pallor.  Psychiatric: She has a normal mood and affect. Her behavior is normal.  Nursing note and vitals reviewed.     Vitals:   10/23/18 1050  BP: (!) 142/80  Pulse: 100  Temp: (!) 97.4 F (36.3 C)  SpO2: 91%    Assessment & Plan:   Cough productive of purulent sputum, fever, pulmonary  fibrosis- due to patient's respiratory history I would like to get new chest x-ray in clinic.  However, patient declines chest x-ray due to having scan of chest last month.  Due to fever and coughing up discolored sputum we will cover with doxycycline to ensure we treat possible pneumonia.  Patient also will do steroid burst 10 mg/day for 5 days.  She will  use albuterol inhaler 2 puffs every 4-6 hours as needed to open up the lungs.  She will also use Tessalon Perles to help reduce cough, also advised she may continue to use Delsym over-the-counter for cough if this works better than the [employer].  Patient aware that if her symptoms do not slowly begin to improve by end of week to call office right away.  Keep regularly scheduled follow-up with PCP.  Return to clinic sooner if any issues arise or if current symptoms persist or worsen.

## 2018-10-23 NOTE — Progress Notes (Signed)
Nursing Pain Medication Assessment:  Safety precautions to be maintained throughout the outpatient stay will include: orient to surroundings, keep bed in low position, maintain call bell within reach at all times, provide assistance with transfer out of bed and ambulation.  Medication Inspection Compliance: Ms. Cizek did not comply with our request to bring her pills to be counted. She was reminded that bringing the medication bottles, even when empty, is a requirement.  Medication: None brought in. Pill/Patch Count: None available to be counted. Bottle Appearance: No container available. Did not bring bottle(s) to appointment. Filled Date: N/A Last Medication intake:  Today

## 2018-10-23 NOTE — Telephone Encounter (Signed)
Pt called requesting to speak with a nurse about med refill for oxycodone, she only has 2 pills left. I explained to pt that we don't call in Narcotics that she would have to schedule an appt, I scheduled her with Dionisio David for today @2pm . Just FYI

## 2018-10-23 NOTE — Patient Instructions (Addendum)
____________________________________________________________________________________________  Medication Rules  Applies to: All patients receiving prescriptions (written or electronic).  Pharmacy of record: Pharmacy where electronic prescriptions will be sent. If written prescriptions are taken to a different pharmacy, please inform the nursing staff. The pharmacy listed in the electronic medical record should be the one where you would like electronic prescriptions to be sent.  Prescription refills: Only during scheduled appointments. Applies to both, written and electronic prescriptions.  NOTE: The following applies primarily to controlled substances (Opioid* Pain Medications).   Patient's responsibilities: 1. Pain Pills: Bring all pain pills to every appointment (except for procedure appointments). 2. Pill Bottles: Bring pills in original pharmacy bottle. Always bring newest bottle. Bring bottle, even if empty. 3. Medication refills: You are responsible for knowing and keeping track of what medications you need refilled. The day before your appointment, write a list of all prescriptions that need to be refilled. Bring that list to your appointment and give it to the admitting nurse. Prescriptions will be written only during appointments. If you forget a medication, it will not be "Called in", "Faxed", or "electronically sent". You will need to get another appointment to get these prescribed. 4. Prescription Accuracy: You are responsible for carefully inspecting your prescriptions before leaving our office. Have the discharge nurse carefully go over each prescription with you, before taking them home. Make sure that your name is accurately spelled, that your address is correct. Check the name and dose of your medication to make sure it is accurate. Check the number of pills, and the written instructions to make sure they are clear and accurate. Make sure that you are given enough medication to last  until your next medication refill appointment. 5. Taking Medication: Take medication as prescribed. Never take more pills than instructed. Never take medication more frequently than prescribed. Taking less pills or less frequently is permitted and encouraged, when it comes to controlled substances (written prescriptions).  6. Inform other Doctors: Always inform, all of your healthcare providers, of all the medications you take. 7. Pain Medication from other Providers: You are not allowed to accept any additional pain medication from any other Doctor or Healthcare provider. There are two exceptions to this rule. (see below) In the event that you require additional pain medication, you are responsible for notifying us, as stated below. 8. Medication Agreement: You are responsible for carefully reading and following our Medication Agreement. This must be signed before receiving any prescriptions from our practice. Safely store a copy of your signed Agreement. Violations to the Agreement will result in no further prescriptions. (Additional copies of our Medication Agreement are available upon request.) 9. Laws, Rules, & Regulations: All patients are expected to follow all Federal and State Laws, Statutes, Rules, & Regulations. Ignorance of the Laws does not constitute a valid excuse. The use of any illegal substances is prohibited. 10. Adopted CDC guidelines & recommendations: Target dosing levels will be at or below 60 MME/day. Use of benzodiazepines** is not recommended.  Exceptions: There are only two exceptions to the rule of not receiving pain medications from other Healthcare Providers. 1. Exception #1 (Emergencies): In the event of an emergency (i.e.: accident requiring emergency care), you are allowed to receive additional pain medication. However, you are responsible for: As soon as you are able, call our office [phone], at any time of the day or night, and leave a message stating your name, the  date and nature of the emergency, and the name and dose of the medication   prescribed. In the event that your call is answered by a member of our staff, make sure to document and save the date, time, and the name of the person that took your information.  2. Exception #2 (Planned Surgery): In the event that you are scheduled by another doctor or dentist to have any type of surgery or procedure, you are allowed (for a period no longer than 30 days), to receive additional pain medication, for the acute post-op pain. However, in this case, you are responsible for picking up a copy of our "Post-op Pain Management for Surgeons" handout, and giving it to your surgeon or dentist. This document is available at our office, and does not require an appointment to obtain it. Simply go to our office during business hours (Monday-Thursday from 8:00 AM to 4:00 PM) (Friday 8:00 AM to 12:00 Noon) or if you have a scheduled appointment with [place], prior to your surgery, and ask for it by name. In addition, you will need to provide [place] with your name, name of your surgeon, type of surgery, and date of procedure or surgery.  *Opioid medications include: morphine, codeine, oxycodone, oxymorphone, hydrocodone, hydromorphone, meperidine, tramadol, tapentadol, buprenorphine, fentanyl, methadone. **Benzodiazepine medications include: diazepam (Valium), alprazolam (Xanax), clonazepam (Klonopine), lorazepam (Ativan), clorazepate (Tranxene), chlordiazepoxide (Librium), estazolam (Prosom), oxazepam (Serax), temazepam (Restoril), triazolam (Halcion) (Last updated: 02/02/2018) ____________________________________________________________________________________________    BMI Assessment: Estimated body mass index is 33.47 kg/m as calculated from the following:   Height as of this encounter: 5\' 4"  (1.626 m).   Weight as of this encounter: 195 lb (88.5 kg).  BMI interpretation table: BMI level Category Range association with higher  incidence of chronic pain  <18 kg/m2 Underweight   18.5-24.9 kg/m2 Ideal body weight   25-29.9 kg/m2 Overweight Increased incidence by 20%  30-34.9 kg/m2 Obese (Class I) Increased incidence by 68%  35-39.9 kg/m2 Severe obesity (Class II) Increased incidence by 136%  >40 kg/m2 Extreme obesity (Class III) Increased incidence by 254%   Patient's current BMI Ideal Body weight  Body mass index is 33.47 kg/m. Ideal body weight: 54.7 kg (120 lb 9.5 oz) Adjusted ideal body weight: 68.2 kg (150 lb 5.7 oz)   BMI Readings from Last 4 Encounters:  10/23/18 33.47 kg/m  10/23/18 33.51 kg/m  09/12/18 31.24 kg/m  08/24/18 33.64 kg/m   Wt Readings from Last 4 Encounters:  10/23/18 195 lb (88.5 kg)  10/23/18 195 lb 3.2 oz (88.5 kg)  09/12/18 182 lb (82.6 kg)  08/24/18 196 lb (88.9 kg)

## 2018-10-24 ENCOUNTER — Other Ambulatory Visit: Payer: Self-pay

## 2018-10-24 NOTE — Telephone Encounter (Signed)
Requesting refill of Clopidogrel Please review.

## 2018-10-25 MED ORDER — CLOPIDOGREL BISULFATE 75 MG PO TABS: 75.0000 mg | ORAL_TABLET | Freq: Every day | ORAL | 0 refills | Status: DC

## 2018-10-25 NOTE — Telephone Encounter (Signed)
Plavix is not filled by [facility].

## 2018-10-26 ENCOUNTER — Ambulatory Visit: Payer: Self-pay

## 2018-10-26 NOTE — Telephone Encounter (Signed)
Pt. Called to report when she picked up her inhaler from the pharmacy and took it home, it would not work. Took it back to the pharmacy and they told her to run it under hot water and try it. She states it still does not work and they will not replace it. Spoke with Dominica at [facility] and reports pt. Is supposed to contact the manufacturer to get another one. Then states they will give pt. another one. Pt. Informed of this.

## 2018-11-07 ENCOUNTER — Other Ambulatory Visit: Payer: Self-pay | Admitting: Family Medicine

## 2018-11-07 ENCOUNTER — Encounter: Payer: Medicare Other | Admitting: Student in an Organized Health Care Education/Training Program

## 2018-11-07 DIAGNOSIS — R05 Cough: Secondary | ICD-10-CM

## 2018-11-07 DIAGNOSIS — R058 Other specified cough: Secondary | ICD-10-CM

## 2018-11-07 DIAGNOSIS — J8410 Pulmonary fibrosis, unspecified: Secondary | ICD-10-CM

## 2018-11-13 ENCOUNTER — Ambulatory Visit (INDEPENDENT_AMBULATORY_CARE_PROVIDER_SITE_OTHER): Payer: Medicare Other

## 2018-11-13 DIAGNOSIS — I639 Cerebral infarction, unspecified: Secondary | ICD-10-CM

## 2018-11-14 ENCOUNTER — Ambulatory Visit
Payer: Medicare Other | Attending: Student in an Organized Health Care Education/Training Program | Admitting: Student in an Organized Health Care Education/Training Program

## 2018-11-14 ENCOUNTER — Other Ambulatory Visit: Payer: Self-pay

## 2018-11-14 ENCOUNTER — Encounter: Payer: Self-pay | Admitting: Student in an Organized Health Care Education/Training Program

## 2018-11-14 VITALS — BP 155/89 | HR 85 | Temp 98.1°F | Resp 16 | Ht 64.0 in | Wt 182.0 lb

## 2018-11-14 DIAGNOSIS — R918 Other nonspecific abnormal finding of lung field: Secondary | ICD-10-CM | POA: Insufficient documentation

## 2018-11-14 DIAGNOSIS — Z886 Allergy status to analgesic agent status: Secondary | ICD-10-CM | POA: Diagnosis not present

## 2018-11-14 DIAGNOSIS — M25552 Pain in left hip: Secondary | ICD-10-CM | POA: Diagnosis not present

## 2018-11-14 DIAGNOSIS — M8580 Other specified disorders of bone density and structure, unspecified site: Secondary | ICD-10-CM | POA: Diagnosis not present

## 2018-11-14 DIAGNOSIS — K219 Gastro-esophageal reflux disease without esophagitis: Secondary | ICD-10-CM | POA: Insufficient documentation

## 2018-11-14 DIAGNOSIS — E538 Deficiency of other specified B group vitamins: Secondary | ICD-10-CM | POA: Diagnosis not present

## 2018-11-14 DIAGNOSIS — M5416 Radiculopathy, lumbar region: Secondary | ICD-10-CM | POA: Insufficient documentation

## 2018-11-14 DIAGNOSIS — G894 Chronic pain syndrome: Secondary | ICD-10-CM | POA: Diagnosis not present

## 2018-11-14 DIAGNOSIS — Z79899 Other long term (current) drug therapy: Secondary | ICD-10-CM | POA: Insufficient documentation

## 2018-11-14 DIAGNOSIS — M7062 Trochanteric bursitis, left hip: Secondary | ICD-10-CM | POA: Diagnosis not present

## 2018-11-14 DIAGNOSIS — F325 Major depressive disorder, single episode, in full remission: Secondary | ICD-10-CM | POA: Diagnosis not present

## 2018-11-14 DIAGNOSIS — Z86010 Personal history of colonic polyps: Secondary | ICD-10-CM | POA: Diagnosis not present

## 2018-11-14 DIAGNOSIS — R1310 Dysphagia, unspecified: Secondary | ICD-10-CM | POA: Insufficient documentation

## 2018-11-14 DIAGNOSIS — Z885 Allergy status to narcotic agent status: Secondary | ICD-10-CM | POA: Insufficient documentation

## 2018-11-14 DIAGNOSIS — E785 Hyperlipidemia, unspecified: Secondary | ICD-10-CM | POA: Diagnosis not present

## 2018-11-14 DIAGNOSIS — M160 Bilateral primary osteoarthritis of hip: Secondary | ICD-10-CM | POA: Diagnosis not present

## 2018-11-14 DIAGNOSIS — Z87891 Personal history of nicotine dependence: Secondary | ICD-10-CM | POA: Diagnosis not present

## 2018-11-14 DIAGNOSIS — M4316 Spondylolisthesis, lumbar region: Secondary | ICD-10-CM | POA: Insufficient documentation

## 2018-11-14 DIAGNOSIS — Z888 Allergy status to other drugs, medicaments and biological substances status: Secondary | ICD-10-CM | POA: Diagnosis not present

## 2018-11-14 DIAGNOSIS — F419 Anxiety disorder, unspecified: Secondary | ICD-10-CM | POA: Insufficient documentation

## 2018-11-14 DIAGNOSIS — M961 Postlaminectomy syndrome, not elsewhere classified: Secondary | ICD-10-CM | POA: Diagnosis not present

## 2018-11-14 DIAGNOSIS — R7303 Prediabetes: Secondary | ICD-10-CM | POA: Insufficient documentation

## 2018-11-14 DIAGNOSIS — Z9889 Other specified postprocedural states: Secondary | ICD-10-CM | POA: Diagnosis not present

## 2018-11-14 DIAGNOSIS — M48061 Spinal stenosis, lumbar region without neurogenic claudication: Secondary | ICD-10-CM

## 2018-11-14 DIAGNOSIS — R21 Rash and other nonspecific skin eruption: Secondary | ICD-10-CM | POA: Diagnosis not present

## 2018-11-14 DIAGNOSIS — Z8673 Personal history of transient ischemic attack (TIA), and cerebral infarction without residual deficits: Secondary | ICD-10-CM | POA: Diagnosis not present

## 2018-11-14 DIAGNOSIS — M1612 Unilateral primary osteoarthritis, left hip: Secondary | ICD-10-CM

## 2018-11-14 DIAGNOSIS — M7061 Trochanteric bursitis, right hip: Secondary | ICD-10-CM | POA: Insufficient documentation

## 2018-11-14 DIAGNOSIS — R011 Cardiac murmur, unspecified: Secondary | ICD-10-CM | POA: Insufficient documentation

## 2018-11-14 MED ORDER — PREGABALIN 50 MG PO CAPS: ORAL_CAPSULE | ORAL | 2 refills | Status: DC

## 2018-11-14 NOTE — Progress Notes (Signed)
Nursing Pain Medication Assessment:  Safety precautions to be maintained throughout the outpatient stay will include: orient to surroundings, keep bed in low position, maintain call bell within reach at all times, provide assistance with transfer out of bed and ambulation.  Medication Inspection Compliance: Pill count conducted under aseptic conditions, in front of the patient. Neither the pills nor the bottle was removed from the patient's sight at any time. Once count was completed pills were immediately returned to the patient in their original bottle.  Medication: Oxycodone/APAP Pill/Patch Count: 37.5 of 60 pills remain Pill/Patch Appearance: Markings consistent with prescribed medication Bottle Appearance: Standard pharmacy container. Clearly labeled. Filled Date: 15 / 19/ 2019 Last Medication intake:  Today

## 2018-11-14 NOTE — Progress Notes (Signed)
Patient's Name: Melody Ochoa  MRN: [id number]  Referring Provider: Arnett, Margaret G, FNP  DOB: 04/06/1941  PCP: Arnett, Margaret G, FNP  DOS: 11/14/2018  Note by:  , MD  Service setting: Ambulatory outpatient  Specialty: Interventional Pain Management  Location: [facility] (AMB) Pain Management Facility    Patient type: Established   Primary Reason(s) for Visit: Evaluation of chronic illnesses with exacerbation, or progression (Level of risk: moderate) CC: Hip Pain (left)  HPI  Melody Ochoa is a 77 y.o. year old, female patient, who comes today for a follow-up evaluation. She has Spondylolisthesis of lumbar region; Failed back surgical syndrome; UTI (lower urinary tract infection); Low back pain; Personal history of colonic polyps; Status post total replacement of right hip; Stroke (cerebrum) (HCC) - Patchy small volume acute ischemic nonhemorrhagic superior left cerebellar infarct, left SCA territory, of undetermined etiology, with diffuse atherosclerosis; Depression, recurrent (HCC); Pulmonary nodules; History of lumbar surgery; Osteopenia; Hyperlipidemia; Rash; Lumbar radiculopathy; History of stroke; Greater trochanteric bursitis of both hips; Degeneration of lumbar or lumbosacral intervertebral disc; Major depression in remission (HCC); Primary osteoarthritis of left hip; Thoracic aortic atherosclerosis (HCC); Cough; SOB (shortness of breath) on exertion; Dysphagia; Chronic pain syndrome; Anxiety; B12 deficiency; Generalized OA; Left hip pain; Menopause; Prediabetes; Strain of left hip; and Foraminal stenosis of lumbar region on their problem list. Melody Ochoa was last seen on 09/12/2018. Her primarily concern today is the Hip Pain (left)  Pain Assessment: Location: Left Hip Radiating: radiates down the front of leg to the knee Onset: More than a month ago Duration: Chronic pain Quality: Burning, Aching, Tingling Severity: 7 /10 (subjective, self-reported pain score)  Note: Reported  level is compatible with observation.                         When using our objective Pain Scale, levels between 6 and 10/10 are said to belong in an emergency room, as it progressively worsens from a 6/10, described as severely limiting, requiring emergency care not usually available at an outpatient pain management facility. At a 6/10 level, communication becomes difficult and requires great effort. Assistance to reach the emergency department may be required. Facial flushing and profuse sweating along with potentially dangerous increases in heart rate and blood pressure will be evident. Effect on ADL: "I have trouble walking" Timing: Intermittent Modifying factors: rest BP: (!) 155/89  HR: 85  Further details on both, my assessment(s), as well as the proposed treatment plan, please see below.  Patient follows up today endorsing worsening pain of her left hip.  She is also endorsing weakness of her left leg.  She states that the weakness of her left leg started last week.  She denies any bowel or bladder dysfunction.  She states that the lumbar epidural steroid injections only provided benefit for 3 to 4 days with return of pain thereafter.  She endorses decreased quality of life and difficulty ambulating.  Of note she did try a prednisone taper for symptoms of bronchitis approximately 10 days ago which she state had no impact in regards to her back pain.  Do not feel that additional steroid taper would be of benefit.  Patient's lumbar MRI from June 2019 shows L3-L5 fusion with impingement of the left L2 and left L3 nerve roots secondary to foraminal encroachment.  This is likely the major culprit of her worsening left hip and left leg pain.  Patient has been refractory to epidural steroid injections for radicular management.     Laboratory Chemistry  Inflammation Markers (CRP: Acute Phase) (ESR: Chronic Phase) No results found for: CRP, ESRSEDRATE, LATICACIDVEN                       Rheumatology  Markers Lab Results  Component Value Date   RF <10.0 08/25/2018                        Renal Function Markers Lab Results  Component Value Date   BUN 12 04/17/2018   CREATININE 0.84 04/17/2018   GFRAA >60 04/17/2018   GFRNONAA >60 04/17/2018                             Hepatic Function Markers Lab Results  Component Value Date   AST 19 04/17/2018   ALT 17 04/17/2018   ALBUMIN 4.2 04/17/2018   ALKPHOS 64 04/17/2018                        Electrolytes Lab Results  Component Value Date   NA 135 04/17/2018   K 3.7 04/17/2018   CL 99 (L) 04/17/2018   CALCIUM 8.8 (L) 04/17/2018                        Neuropathy Markers Lab Results  Component Value Date   VITAMINB12 334 11/23/2014   FOLATE >20.0 11/23/2014   HGBA1C 5.3 08/10/2017                        CNS Tests No results found for: COLORCSF, APPEARCSF, RBCCOUNTCSF, WBCCSF, POLYSCSF, LYMPHSCSF, EOSCSF, PROTEINCSF, GLUCCSF, JCVIRUS, CSFOLI, IGGCSF                      Bone Pathology Markers No results found for: VD25OH, VD125OH2TOT, VD3125OH2, VD2125OH2, 25OHVITD1, 25OHVITD2, 25OHVITD3, TESTOFREE, TESTOSTERONE                       Coagulation Parameters Lab Results  Component Value Date   INR 0.91 08/09/2017   LABPROT 12.2 08/09/2017   APTT 27 08/09/2017   PLT 299 04/17/2018                        Cardiovascular Markers Lab Results  Component Value Date   TROPONINI <0.03 08/19/2017   HGB 11.6 (L) 04/17/2018   HCT 34.3 (L) 04/17/2018                         CA Markers No results found for: CEA, CA125, LABCA2                      Note: Lab results reviewed.  Recent Diagnostic Imaging Review  Cervical Imaging:  Results for orders placed during the hospital encounter of 04/11/18  CT Cervical Spine Wo Contrast   Narrative CLINICAL DATA:  76-year-old female status post fall out of bed on the floor and left hip at 0200 hours yesterday. On blood thinners.  EXAM: CT HEAD WITHOUT CONTRAST  CT CERVICAL  SPINE WITHOUT CONTRAST  TECHNIQUE: Multidetector CT imaging of the head and cervical spine was performed following the standard protocol without intravenous contrast. Multiplanar CT image reconstructions of the cervical spine were also generated.  COMPARISON:  CTA head and neck 08/09/2017, brain   MRI 08/10/2017.  FINDINGS: CT HEAD FINDINGS  Brain: Stable chronic encephalomalacia in the left inferior parietal and superior occipital lobes, and to a lesser extent left cerebellum. Stable gray-white matter differentiation throughout the brain.  No midline shift, ventriculomegaly, mass effect, evidence of mass lesion, intracranial hemorrhage or evidence of cortically based acute infarction.  Vascular: Calcified atherosclerosis at the skull base. No suspicious intracranial vascular hyperdensity.  Skull: Stable, No acute osseous abnormality identified.  Sinuses/Orbits: Visualized paranasal sinuses and mastoids are stable and well pneumatized.  Other: No acute orbit or scalp soft tissue finding.  CT CERVICAL SPINE FINDINGS  Alignment: Stable since 2018. Overall straightening of cervical lordosis but mild chronic spondylolisthesis at C3-C4, C6-C7 and to a lesser extent C7-T1. Posterior element alignment is stable.  Skull base and vertebrae: Visualized skull base is intact. No atlanto-occipital dissociation. No acute cervical spine fracture identified. Chronic postoperative changes described below.  Soft tissues and spinal canal: No prevertebral fluid or swelling. No visible canal hematoma.  Disc levels: Previous posterior decompression at the C5 and C6 levels. Chronic bilateral C5 and C6 posterior laminar hardware appears stable. Superimposed severe chronic intermittent facet arthropathy elsewhere. Widespread chronic disc space loss and endplate spurring.  Chronic mild spinal stenosis at the C1 odontoid level related to degenerative ligamentous hypertrophy.  Upper chest:  There is a small right C7 cervical rib. The visible upper thoracic levels appear stable and intact. Advanced chronic T1-T2 disc and endplate degeneration.  Stable lung apices with fibrosis and scarring greater on the right. Calcified aortic atherosclerosis. The noncontrast thoracic inlet appears stable.  IMPRESSION: 1. No acute traumatic injury identified in the head or cervical spine. 2. Non contrast CT appearance of the brain is stable since September 2018. Chronic ischemia in the left PCA and cerebellar artery territories. 3. Chronic cervical spine postoperative changes and spinal degeneration.   Electronically Signed   By: Genevie Ann M.D.   On: 04/11/2018 16:18      Results for orders placed during the hospital encounter of 03/10/18  DG Thoracic Spine 2 View   Narrative CLINICAL DATA:  Chronic upper back pain, recent fall with increasing back pain  EXAM: THORACIC SPINE 2 VIEWS  COMPARISON:  08/19/2017  FINDINGS: No compression deformity is noted. Multilevel osteophytic changes are seen. No paraspinal mass is noted. Postsurgical changes in the lower cervical spine are seen. Visualize ribcage is within normal limits. Bilateral fibrotic changes of lungs are noted.  IMPRESSION: Chronic degenerative changes without acute abnormality.   Electronically Signed   By: Inez Catalina M.D.   On: 03/10/2018 16:30     Lumbosacral Imaging: Lumbar MR wo contrast:  Results for orders placed during the hospital encounter of 05/16/18  MR LUMBAR SPINE WO CONTRAST   Narrative CLINICAL DATA:  Adjacent segment disease. Lumbar fusion. Spinal stenosis.  EXAM: MRI LUMBAR SPINE WITHOUT CONTRAST  TECHNIQUE: Multiplanar, multisequence MR imaging of the lumbar spine was performed. No intravenous contrast was administered.  COMPARISON:  Lumbar MRI 02/26/2015  FINDINGS: Segmentation:  Normal  Alignment:  Mild dextroscoliosis.  Mild retrolisthesis L1-2  Vertebrae: Interval  fusion at L3-4. Prior fusion L4-5 unchanged from the prior MRI. Negative for fracture or mass.  Conus medullaris and cauda equina: Conus extends to the L1-2 level. Conus and cauda equina appear normal.  Paraspinal and other soft tissues: Negative for mass or adenopathy.  Disc levels:  L1-2: Mild retrolisthesis.  Mild disc and facet degeneration.  L2-3: Progressive disc degeneration since the prior study. Asymmetric disc  degeneration and spurring on the left. Moderate subarticular stenosis on the left with severe left foraminal encroachment. Impingement left L2 and L3 nerve roots. Mild spinal stenosis has progressed in the interval.  L3-4: Pedicle screw and interbody fusion. Posterior decompression without stenosis  L4-5: Pedicle screw and interbody fusion.  Negative for stenosis  L5-S1: Mild disc degeneration. Moderate facet degeneration. No significant stenosis.  IMPRESSION: Pedicle screw and interbody fusion L3-4 and L4-5 without stenosis  Progressive degenerative change at L2-3 compatible with adjacent segment degeneration. Mild spinal stenosis. Moderate to severe subarticular foraminal stenosis on the left.   Electronically Signed   By: Charles  Clark M.D.   On: 05/16/2018 11:00    Results for orders placed during the hospital encounter of 09/06/16  MR Lumbar Spine W Wo Contrast   Narrative CLINICAL DATA:  Prior surgery 1 year ago, low back and LEFT leg pain.  EXAM: MRI LUMBAR SPINE WITHOUT AND WITH CONTRAST  TECHNIQUE: Multiplanar and multiecho pulse sequences of the lumbar spine were obtained without and with intravenous contrast.  CONTRAST:  MultiHance 18 mL.  COMPARISON:  02/26/2015  FINDINGS: Segmentation:  Standard.  Alignment:  Anatomic  Vertebrae: Prior PLIF L4-5. Solid arthrodesis not established. No worrisome osseous lesion. Endplate reactive changes L2-L3. No concerning postcontrast enhancement.  Conus medullaris: Extends to the L1 level  and appears normal.  Paraspinal and other soft tissues: Unremarkable  Disc levels:  L1-L2:  Annular bulge.  Mild facet hypertrophy.  No impingement.  L2-L3: Annular bulge centrally. Foraminal and extraforaminal protrusion on the LEFT. Facet arthropathy. LEFT L2 and LEFT L3 nerve root impingement likely.  L3-L4: Disc space narrowing. Central extrusion. Advanced posterior element hypertrophy affecting facets and ligamentum flavum. Moderate to severe stenosis. Disc material extends to both neural foramina. BILATERAL L4 and L3 nerve root impingement, worse on the LEFT.  L4-L5: Adequate posterior decompression. No definite residual impingement. Solid arthrodesis not established.  L5-S1: Annular bulge. Severe facet arthropathy, worse on the RIGHT. Disc material extends into the RIGHT neural foramen where nodular hypertrophy of the RIGHT ligamentum flavum measuring 5 mm could represent synovial cyst. RIGHT L5 and RIGHT S1 nerve root impingement are possible.  Compared with priors, the previously noted dorsal fluid collection has resolved. Adjacent segment disease at L3-4 and L2-3 is worse.  IMPRESSION: Previous L4-5 PLIF. Solid arthrodesis not established. Consider CT lumbar spine without contrast to further assess.  Resolved postoperative fluid collection from 2016. No worrisome postcontrast enhancement to suggest infection.  Adjacent segment disease at L3-4 has progressed from 2016. Posterior element hypertrophy and central extrusion contribute to BILATERAL L3 and L4 nerve root impingement, worse on the LEFT.  New foraminal and extraforaminal protrusion on the LEFT at L2-L3 could contribute also to LEFT leg pain.   Electronically Signed   By: John T Curnes M.D.   On: 09/06/2016 11:33    Lumbar MR w contrast: No results found for this or any previous visit. Lumbar CT wo contrast:  Results for orders placed during the hospital encounter of 02/15/18  CT LUMBAR SPINE WO  CONTRAST   Narrative CLINICAL DATA:  Lumbar fusion.  Left hip pain.  EXAM: CT LUMBAR SPINE WITHOUT CONTRAST  TECHNIQUE: Multidetector CT imaging of the lumbar spine was performed without intravenous contrast administration. Multiplanar CT image reconstructions were also generated.  COMPARISON:  MRI of the lumbar spine 09/06/2016.  FINDINGS: Segmentation: 5 non rib-bearing lumbar type vertebral bodies are present.  Alignment: Slight retrolisthesis is present at L2-3. AP alignment is otherwise   anatomic.  Vertebrae: Endplate changes are associated with fusion at L3-4 and L4-5. Vertebral body heights are maintained. Schmorl's nodes are present at L2-3.  Paraspinal and other soft tissues: Atherosclerotic calcifications are present in the aorta and branch vessels. There is no aneurysm. The lung bases are clear.  Disc levels: L1-2: Moderate facet hypertrophy is present. Mild lateral disc bulging is present. Mild left foraminal narrowing is noted.  L2-3: A far left lateral disc protrusion is present. Moderate facet hypertrophy is noted bilaterally. Mild subarticular narrowing is present on the left. Moderate left and mild right foraminal stenosis has progressed.  L3-4: Lumbar fusion has been extended. A right laminectomy is noted. There is bridging bone across the disc space. Central canal is decompressed. A right foraminotomy is noted. The foramina are patent bilaterally.  L4-5: Solid fusion is present. A wide laminectomy is noted. No residual recurrent stenosis is present.  L5-S1: Laminectomy is present. Mild disc bulging is present without significant stenosis.  IMPRESSION: 1. Extension of lumbar fusion to include L3-4 with decompression of the central canal and foramina bilaterally. 2. Progressive adjacent level disease at L2-3 with mild left subarticular narrowing and moderate left and mild right foraminal stenosis. 3. Stable postoperative changes at L4-5 and L5-S1  without significant stenosis at these levels.   Electronically Signed   By: San Morelle M.D.   On: 02/15/2018 09:30    Lumbar DG 1V:  Results for orders placed during the hospital encounter of 11/18/14  DG Lumbar Spine 1 View   Narrative CLINICAL DATA:  Lumbar spine surgery.  Spondylolisthesis.  EXAM: LUMBAR SPINE - 1 VIEW  COMPARISON:  MRI 09/16/2014  FINDINGS: Single intraoperative image demonstrates surgical marking along the posterior aspect of L5-S1.  IMPRESSION: Surgical marking at L5-S1.   Electronically Signed   By: Markus Daft M.D.   On: 11/18/2014 14:59     Lumbar DG 2-3 views:  Results for orders placed during the hospital encounter of 04/11/18  DG Lumbar Spine 2-3 Views   Narrative CLINICAL DATA:  Recent fall with pelvic and low back pain, initial encounter  EXAM: LUMBAR SPINE - 3 VIEW  COMPARISON:  03/13/2018  FINDINGS: Five lumbar type vertebral bodies are well visualized. Changes of prior interbody fusion at L3-4 and L4-5 with posterior fixation are seen. Degenerative changes are noted in the upper lumbar spine with mild scoliosis concave to the left. Changes of prior right hip replacement are noted. Diffuse aortic calcifications are seen.  IMPRESSION: Postsurgical and degenerative changes without acute abnormality.   Electronically Signed   By: Inez Catalina M.D.   On: 04/11/2018 17:28    Lumbar DG (Complete) 4+V:  Results for orders placed during the hospital encounter of 11/21/14  DG Lumbar Spine Complete   Narrative CLINICAL DATA:  Recent lumbar fusion with constipation. Lower back pain.  EXAM: LUMBAR SPINE - COMPLETE 4+ VIEW  COMPARISON:  11/18/2014 fluoroscopy  FINDINGS: Recent L4-5 posterior lumbar interbody fusion with rod and pedicle screw fixation. Vertebral body heights are maintained - no evidence of acute fracture. The intervertebral graft remains in good position. The rod and pedicle screws are  located.  Degenerative endplate spurring throughout the lumbar spine. No endplate erosion.  Osteopenia.  IMPRESSION: L4-5 PLIF with rod and pedicle screw fixation.  No adverse findings.   Electronically Signed   By: Jorje Guild M.D.   On: 11/22/2014 00:24    Lumbar DG F/E views: No results found for this or any previous visit. Lumbar DG  Bending views:  Results for orders placed during the hospital encounter of 03/10/18  DG Lumbar Spine Complete W/Bend   Narrative CLINICAL DATA:  Low back pain, history of recent fall  EXAM: LUMBAR SPINE - COMPLETE WITH BENDING VIEWS  COMPARISON:  02/15/2018  FINDINGS: Five lumbar type vertebral bodies are well visualized. Vertebral body height is well maintained. Interbody fusion is noted at L3-4 and L4-5 with pedicle screws at L3 and L5. No hardware failure is noted. Mild retrolisthesis of L2 on L3 is noted stable from prior CT. Flexion and extension views show no significant instability. Aortic atherosclerotic changes are noted without aneurysmal dilatation.  IMPRESSION: Postoperative and degenerative changes similar that seen on recent CT. No instability is noted on flexion and extension.   Electronically Signed   By: Inez Catalina M.D.   On: 03/10/2018 16:28    Hip-R MR wo contrast:  Results for orders placed during the hospital encounter of 04/07/16  MR Hip Right Wo Contrast   Narrative CLINICAL DATA:  Bilateral hip pain for 1 year, left worse than right. No known injury.  EXAM: MR OF THE RIGHT HIP WITHOUT CONTRAST  TECHNIQUE: Multiplanar, multisequence MR imaging was performed. No intravenous contrast was administered.  COMPARISON:  Initial encounter.  FINDINGS: Bones: No fracture, avascular necrosis or worrisome marrow lesion is identified. Osteophytosis about the femoral heads is much worse on the right. Small subchondral cysts are seen in the left acetabular roof. Artifact from lower lumbar fusion hardware  is noted  Articular cartilage and labrum  Articular cartilage: Severely degenerated with associated joint space narrowing on the right. Milder degree of degenerative change on the left is identified.  Labrum: The right labrum is diffusely degenerated and torn. A right paralabral cyst at the 12 o'clock position measures 1.3 cm in diameter.  Joint or bursal effusion  Joint effusion:  None.  Bursae: Fluid is present in the trochanter bursae, greater on the left.  Muscles and tendons  Muscles and tendons:  Intact.  Other findings  Miscellaneous: Imaged intrapelvic contents demonstrate extensive sigmoid diverticulosis.  IMPRESSION: Right worse than left hip osteoarthritis. Associated labral tearing on the right with paralabral cyst formation is noted.  Bilateral trochanteric bursitis appears worse on the left.  Sigmoid diverticulosis.   Electronically Signed   By: Inge Rise M.D.   On: 04/07/2016 14:54     Results for orders placed during the hospital encounter of 04/17/18  CT Hip Left Wo Contrast   Narrative CLINICAL DATA:  Worsening left hip pain after fall a week ago.  EXAM: CT OF THE LEFT HIP WITHOUT CONTRAST  TECHNIQUE: Multidetector CT imaging of the left hip was performed according to the standard protocol. Multiplanar CT image reconstructions were also generated.  COMPARISON:  Left hip x-rays dated Apr 11, 2018.  FINDINGS: Bones/Joint/Cartilage  No acute fracture or dislocation. Mild anterior hip joint space narrowing with subchondral cystic change in the acetabulum. No joint effusion. Osteopenia.  Ligaments  Suboptimally assessed by CT.  Muscles and Tendons  Atrophy of the left gluteus minimus muscle.  Otherwise unremarkable.  Soft tissues  Vascular calcifications. No soft tissue mass or fluid collection. Please see separate CT abdomen pelvis report from same day for intrapelvic findings.  IMPRESSION: 1.  No acute osseous  abnormality. 2. Mild left hip osteoarthritis.   Electronically Signed   By: Titus Dubin M.D.   On: 04/17/2018 12:49    Hip-R DG 2-3 views: No results found for this or any previous visit.  Hip-L DG 2-3 views:  Results for orders placed during the hospital encounter of 04/11/18  DG Hip Unilat With Pelvis 2-3 Views Left   Narrative CLINICAL DATA:  76-year-old female status post fall out of bed at 0200 hours yesterday with left hip pain.  EXAM: DG HIP (WITH OR WITHOUT PELVIS) 2-3V LEFT  COMPARISON:  Left hip series 08/18/2016. CT lumbar spine 02/15/2018.  FINDINGS: Chronic right bipolar hip arthroplasty. Partially visible lumbar fusion hardware. The pelvis appears stable and intact. The visible proximal right femur appears stable.  The left acetabulum and proximal left femur appears stable and intact. Chronic calcified iliac and left femoral artery atherosclerosis.  IMPRESSION: No acute fracture or dislocation identified about the left hip or pelvis.   Electronically Signed   By: H  Hall M.D.   On: 04/11/2018 16:22     Complexity Note: Imaging results reviewed. Results shared with Ms. Schorsch, using Layman's terms.                         Meds   Current Outpatient Medications:  .  atorvastatin (LIPITOR) 80 MG tablet, Take 1 tablet (80 mg total) daily at 6 PM by mouth., Disp: 90 tablet, Rfl: 3 .  benzonatate (TESSALON) 100 MG capsule, Take 1 capsule (100 mg total) by mouth 3 (three) times daily as needed for cough., Disp: 30 capsule, Rfl: 0 .  buPROPion (WELLBUTRIN XL) 300 MG 24 hr tablet, Take 300 mg by mouth daily. , Disp: , Rfl:  .  Calcium Carbonate-Vitamin D (CALTRATE 600+D PO), Take 1 tablet by mouth daily., Disp: , Rfl:  .  clopidogrel (PLAVIX) 75 MG tablet, Take 1 tablet (75 mg total) by mouth daily., Disp: 30 tablet, Rfl: 0 .  DULoxetine (CYMBALTA) 60 MG capsule, Take 60 mg by mouth 2 (two) times daily. , Disp: , Rfl:  .  famotidine (PEPCID) 20 MG tablet,  TAKE 1 TABLET BY MOUTH EVERYDAY AT BEDTIME *NOT COVERED*, Disp: , Rfl: 12 .  ferrous sulfate 325 (65 FE) MG tablet, Take 1 tablet (325 mg total) by mouth daily., Disp: 30 tablet, Rfl: 0 .  [START ON 12/22/2018] oxyCODONE-acetaminophen (PERCOCET) 5-325 MG tablet, Take 1-2 tablets by mouth daily as needed for severe pain., Disp: 60 tablet, Rfl: 0 .  [START ON 11/22/2018] oxyCODONE-acetaminophen (PERCOCET) 5-325 MG tablet, Take 1-2 tablets by mouth daily as needed for severe pain., Disp: 60 tablet, Rfl: 0 .  VENTOLIN HFA 108 (90 Base) MCG/ACT inhaler, TAKE 2 PUFFS BY MOUTH EVERY 6 HOURS AS NEEDED FOR WHEEZE OR SHORTNESS OF BREATH, Disp: 18 Inhaler, Rfl: 0 .  vitamin C (ASCORBIC ACID) 500 MG tablet, Take 500 mg by mouth daily., Disp: , Rfl:  .  Cyanocobalamin (VITAMIN B-12 PO), Take 1 tablet by mouth daily., Disp: , Rfl:  .  pregabalin (LYRICA) 50 MG capsule, Take 1 capsule (50 mg total) by mouth at bedtime for 15 days, THEN 1 capsule (50 mg total) 2 (two) times daily for 15 days., Disp: 45 capsule, Rfl: 2 .  triamcinolone cream (KENALOG) 0.1 %, Apply 1 application topically as needed., Disp: , Rfl: 0  ROS  Constitutional: Denies any fever or chills Gastrointestinal: No reported hemesis, hematochezia, vomiting, or acute GI distress Musculoskeletal: Denies any acute onset joint swelling, redness, loss of ROM, or weakness Neurological: No reported episodes of acute onset apraxia, aphasia, dysarthria, agnosia, amnesia, paralysis, loss of coordination, or loss of consciousness  Allergies  Ms. Brozek is allergic to   aleve [naproxen sodium]; aspirin; celebrex [celecoxib]; effexor [venlafaxine]; gabapentin; hydrocodone-homatropine; ibuprofen; tape; vioxx [rofecoxib]; other; and tramadol.  [place]  Drug: Ms. Feider  reports that she does not use drugs. Alcohol:  reports that she drinks alcohol. Tobacco:  reports that she quit smoking about 22 years ago. Her smoking use included cigarettes. She has a 40.00  pack-year smoking history. She has never used smokeless tobacco. Medical:  has a past medical history of Arthritis, Atheromatous plaque (10/18/2017), Back pain, Cancer ([place]), Depression, Diverticulosis, Dizziness, GERD (gastroesophageal reflux disease), Heart murmur, Hyperlipidemia (10/18/2017), PONV (postoperative nausea and vomiting), and Stroke ([place]). Surgical: Ms. Hickson  has a past surgical history that includes Tonsillectomy; Facelift (89); Cervical disc surgery (8); Back surgery (12); Colonoscopy with propofol (N/A, 01/13/2016); Total hip arthroplasty (Right, 04/22/2016); TEE without cardioversion (N/A, 08/12/2017); LOOP RECORDER INSERTION (N/A, 08/23/2017); Breast excisional biopsy (Left, 90's); and Breast biopsy (Bilateral). Family: family history includes Aneurysm in her brother; Breast cancer (age of onset: 10) in her maternal aunt; Cushing syndrome in her paternal grandmother; Dementia in her paternal grandfather; Diabetes in her maternal grandmother; Kidney disease in her maternal grandfather; Lung cancer in her father; Stroke in her brother.  Constitutional Exam  General appearance: Well nourished, well developed, and well hydrated. In no apparent acute distress Vitals:   11/14/18 0832  BP: (!) 155/89  Pulse: 85  Resp: 16  Temp: 98.1 F (36.7 C)  SpO2: 98%  Weight: 182 lb (82.6 kg)  Height: 5' 4" (1.626 m)   BMI Assessment: Estimated body mass index is 31.24 kg/m as calculated from the following:   Height as of this encounter: 5' 4" (1.626 m).   Weight as of this encounter: 182 lb (82.6 kg).  BMI interpretation table: BMI level Category Range association with higher incidence of chronic pain  <18 kg/m2 Underweight   18.5-24.9 kg/m2 Ideal body weight   25-29.9 kg/m2 Overweight Increased incidence by 20%  30-34.9 kg/m2 Obese (Class I) Increased incidence by 68%  35-39.9 kg/m2 Severe obesity (Class II) Increased incidence by 136%  >40 kg/m2 Extreme obesity (Class III) Increased  incidence by 254%   Patient's current BMI Ideal Body weight  Body mass index is 31.24 kg/m. Ideal body weight: 54.7 kg (120 lb 9.5 oz) Adjusted ideal body weight: 65.8 kg (145 lb 2.5 oz)   BMI Readings from Last 4 Encounters:  11/14/18 31.24 kg/m  10/23/18 33.47 kg/m  10/23/18 33.51 kg/m  09/12/18 31.24 kg/m   Wt Readings from Last 4 Encounters:  11/14/18 182 lb (82.6 kg)  10/23/18 195 lb (88.5 kg)  10/23/18 195 lb 3.2 oz (88.5 kg)  09/12/18 182 lb (82.6 kg)  Psych/Mental status: Alert, oriented x 3 (person, place, & time)       Eyes: PERLA Respiratory: No evidence of acute respiratory distress  Cervical Spine Area Exam  Skin & Axial Inspection: No masses, redness, edema, swelling, or associated skin lesions Alignment: Symmetrical Functional ROM: Unrestricted ROM      Stability: No instability detected Muscle Tone/Strength: Functionally intact. No obvious neuro-muscular anomalies detected. Sensory (Neurological): Unimpaired Palpation: No palpable anomalies              Upper Extremity (UE) Exam    Side: Right upper extremity  Side: Left upper extremity  Skin & Extremity Inspection: Skin color, temperature, and hair growth are WNL. No peripheral edema or cyanosis. No masses, redness, swelling, asymmetry, or associated skin lesions. No contractures.  Skin & Extremity Inspection: Skin color, temperature, and hair growth are  WNL. No peripheral edema or cyanosis. No masses, redness, swelling, asymmetry, or associated skin lesions. No contractures.  Functional ROM: Unrestricted ROM          Functional ROM: Unrestricted ROM          Muscle Tone/Strength: Functionally intact. No obvious neuro-muscular anomalies detected.  Muscle Tone/Strength: Functionally intact. No obvious neuro-muscular anomalies detected.  Sensory (Neurological): Unimpaired          Sensory (Neurological): Unimpaired          Palpation: No palpable anomalies              Palpation: No palpable anomalies               Provocative Test(s):  Phalen's test: deferred Tinel's test: deferred Apley's scratch test (touch opposite shoulder):  Action 1 (Across chest): deferred Action 2 (Overhead): deferred Action 3 (LB reach): deferred   Provocative Test(s):  Phalen's test: deferred Tinel's test: deferred Apley's scratch test (touch opposite shoulder):  Action 1 (Across chest): deferred Action 2 (Overhead): deferred Action 3 (LB reach): deferred    Thoracic Spine Area Exam  Skin & Axial Inspection: No masses, redness, or swelling Alignment: Symmetrical Functional ROM: Unrestricted ROM Stability: No instability detected Muscle Tone/Strength: Functionally intact. No obvious neuro-muscular anomalies detected. Sensory (Neurological): Unimpaired Muscle strength & Tone: No palpable anomalies  Lumbar Spine Area Exam  Skin & Axial Inspection: Well healed scar from previous spine surgery detected Alignment: Asymmetric Functional ROM: Diminished ROM       Stability: No instability detected Muscle Tone/Strength: Functionally intact. No obvious neuro-muscular anomalies detected. Sensory (Neurological): Musculoskeletal pain pattern Palpation: No palpable anomalies       Provocative Tests: Hyperextension/rotation test: (+) due to fusion restriction. Lumbar quadrant test (Kemp's test): (+) due to fusion restriction. Lateral bending test: (+) ipsilateral radicular pain, on the left. Positive for left-sided foraminal stenosis. Patrick's Maneuver: deferred today                   FABER* test: deferred today                   S-I anterior distraction/compression test: deferred today         S-I lateral compression test: deferred today         S-I Thigh-thrust test: deferred today         S-I Gaenslen's test: deferred today         *(Flexion, ABduction and External Rotation)  Gait & Posture Assessment  Ambulation: Limited Gait: Antalgic Posture: Difficulty standing up straight, due to pain   Lower  Extremity Exam    Side: Right lower extremity  Side: Left lower extremity  Stability: No instability observed          Stability: No instability observed          Skin & Extremity Inspection: Skin color, temperature, and hair growth are WNL. No peripheral edema or cyanosis. No masses, redness, swelling, asymmetry, or associated skin lesions. No contractures.  Skin & Extremity Inspection: Skin color, temperature, and hair growth are WNL. No peripheral edema or cyanosis. No masses, redness, swelling, asymmetry, or associated skin lesions. No contractures.  Functional ROM: Unrestricted ROM                  Functional ROM: Decreased ROM for all joints of the lower extremity          Muscle Tone/Strength: Functionally intact. No obvious neuro-muscular anomalies detected.  Muscle Tone/Strength:  Functionally intact. No obvious neuro-muscular anomalies detected.  Sensory (Neurological): Unimpaired        Sensory (Neurological): Dermatomal pain pattern        DTR: Patellar: deferred today Achilles: deferred today Plantar: deferred today  DTR: Patellar: 1+: trace Achilles: 1+: trace Plantar: deferred today  Palpation: No palpable anomalies  Palpation: No palpable anomalies   Assessment  Primary Diagnosis & Pertinent Problem List: The primary encounter diagnosis was Foraminal stenosis of lumbar region. Diagnoses of Failed back surgical syndrome, Primary osteoarthritis of left hip, Spondylolisthesis of lumbar region, Chronic pain syndrome, History of lumbar surgery, and Lumbar radiculopathy were also pertinent to this visit.  Status Diagnosis  Controlled Controlled Controlled 1. Foraminal stenosis of lumbar region   2. Failed back surgical syndrome   3. Primary osteoarthritis of left hip   4. Spondylolisthesis of lumbar region   5. Chronic pain syndrome   6. History of lumbar surgery   7. Lumbar radiculopathy     Problems updated and reviewed during this visit: Problem  Foraminal Stenosis of  Lumbar Region  Failed Back Surgical Syndrome   Patient follows up today endorsing worsening pain of her left hip.  She is also endorsing weakness of her left leg.  She states that the weakness of her left leg started last week.  She denies any bowel or bladder dysfunction.  She states that the lumbar epidural steroid injections only provided benefit for 3 to 4 days with return of pain thereafter.  She endorses decreased quality of life and difficulty ambulating.  Of note she did try a prednisone taper for symptoms of bronchitis approximately 10 days ago which she state had no impact in regards to her back pain.  Do not feel that additional steroid taper would be of benefit.  Patient's lumbar MRI from June 2019 shows L3-L5 fusion with impingement of the left L2 and left L3 nerve roots secondary to foraminal encroachment.  This is likely the major culprit of her worsening left hip and left leg pain.  Patient has been refractory to epidural steroid injections for radicular management.  Her best plan of action at this time will be to optimize medication management while monitoring side effects from centrally acting medications and minimizing patient's risk of fall.  Rather than increasing her opioid medications which the patient wants to avoid, we discussed her starting Lyrica 50 mg nightly then increasing to 50 mg twice daily.  Patient has had night terrors and cognitive side effects with gabapentin did not tolerate.  Patient can follow-up with NP, Crystal King for medication management as scheduled in February.  Patient is obtaining benefit with Lyrica can increase to 50 mg 3 times daily or 75 mg twice daily depending upon patient's experience with Lyrica.  Patient will also try aquatic therapy which she states was beneficial for her axial low back range of motion and overall core strength.  Plan of Care  Pharmacotherapy (Medications Ordered): Meds ordered this encounter  Medications  . DISCONTD:  pregabalin (LYRICA) 50 MG capsule    Sig: Take 1 capsule (50 mg total) by mouth at bedtime for 15 days, THEN 1 capsule (50 mg total) 2 (two) times daily for 15 days.    Dispense:  45 capsule    Refill:  2    Do not place this medication, or any other prescription from our practice, on "Automatic Refill". Patient may have prescription filled one day early if pharmacy is closed on scheduled refill date.  . pregabalin (LYRICA)   50 MG capsule    Sig: Take 1 capsule (50 mg total) by mouth at bedtime for 15 days, THEN 1 capsule (50 mg total) 2 (two) times daily for 15 days.    Dispense:  45 capsule    Refill:  2    Do not place this medication, or any other prescription from our practice, on "Automatic Refill". Patient may have prescription filled one day early if pharmacy is closed on scheduled refill date.    Time Note: Greater than 50% of the 25 minute(s) of face-to-face time spent with Ms. Gillin, was spent in counseling/coordination of care regarding: Ms. Ebrahim primary cause of pain, the treatment plan, treatment alternatives, medication side effects, the results, interpretation and significance of  her recent diagnostic interventional treatment(s), the appropriate use of her medications, realistic expectations, the medication agreement and the patient's responsibilities when it comes to controlled substances.  Future Appointments  Date Time Provider [place]  12/21/2018 11:00 AM Deboraha Sprang, MD [facility]  01/09/2019  1:45 PM Vevelyn Francois, NP [facility] None  02/16/2019  2:00 PM O'Brien-Blaney, Bryson Corona, LPN [facility]    Primary Care Physician: Burnard Hawthorne, FNP Location: [facility] Note by: Gillis Santa, M.D Date: 11/14/2018; Time: 9:53 AM  There are no Patient Instructions on file for this visit.

## 2018-11-15 NOTE — Progress Notes (Signed)
Carelink Summary Report / Loop Recorder 

## 2018-11-17 ENCOUNTER — Telehealth: Payer: Self-pay

## 2018-11-17 MED ORDER — CLOPIDOGREL BISULFATE 75 MG PO TABS: 75.0000 mg | ORAL_TABLET | Freq: Every day | ORAL | 0 refills | Status: DC

## 2018-11-17 NOTE — Telephone Encounter (Signed)
Refill sent to pharmacy pt reminded to keep appointment as scheduled.

## 2018-11-27 ENCOUNTER — Telehealth: Payer: Self-pay | Admitting: *Deleted

## 2018-11-27 ENCOUNTER — Telehealth: Payer: Self-pay | Admitting: Internal Medicine

## 2018-11-27 NOTE — Telephone Encounter (Signed)
°  1. Has your device fired? no  2. Is you device beeping? no  3. Are you experiencing draining or swelling at device site? no  4. Are you calling to see if we received your device transmission? Transmission will be missed patient out of town   5. Have you passed out? No     Please route to [place]

## 2018-11-27 NOTE — Telephone Encounter (Signed)
I called the pt back but she did not answer. I left her a voicemail to call me back at my direct number.

## 2018-11-27 NOTE — Telephone Encounter (Signed)
Attempted to return patients call.  Instructed her to call us back in the office so that we may assess the details.

## 2018-11-30 NOTE — Telephone Encounter (Signed)
Patient wanted clinic to know [place] and she [place] her home monitor at home. She will be back home later this week.

## 2018-12-02 LAB — CUP PACEART REMOTE DEVICE CHECK
Date Time Interrogation Session: 20191106210939
Implantable Pulse Generator Implant Date: 20180918

## 2018-12-04 DIAGNOSIS — L820 Inflamed seborrheic keratosis: Secondary | ICD-10-CM | POA: Diagnosis not present

## 2018-12-04 DIAGNOSIS — L821 Other seborrheic keratosis: Secondary | ICD-10-CM | POA: Diagnosis not present

## 2018-12-04 DIAGNOSIS — L578 Other skin changes due to chronic exposure to nonionizing radiation: Secondary | ICD-10-CM | POA: Diagnosis not present

## 2018-12-04 DIAGNOSIS — L99 Other disorders of skin and subcutaneous tissue in diseases classified elsewhere: Secondary | ICD-10-CM | POA: Diagnosis not present

## 2018-12-05 ENCOUNTER — Telehealth: Payer: Self-pay | Admitting: Cardiology

## 2018-12-05 ENCOUNTER — Telehealth: Payer: Self-pay | Admitting: Nurse Practitioner

## 2018-12-05 NOTE — Telephone Encounter (Signed)
Patient called back and I informed her that her home monitor is up to date and working like it is suppose to. Pt verbalized understanding.

## 2018-12-05 NOTE — Telephone Encounter (Signed)
No answer.LVM for patient to call us back.

## 2018-12-05 NOTE — Telephone Encounter (Signed)
LMOVM for pt to return call 

## 2018-12-05 NOTE — Telephone Encounter (Signed)
Patient lvmail on Mon 12-04-18 stating she is out of meds and has to have her pain meds. She is scheduled for refill on 01-09-18. Please verify if patient needs to come sooner or if she has refill on med and let her know if she has refill

## 2018-12-05 NOTE — Telephone Encounter (Signed)
Patient called and wants to speak to someone re her medicine. She says she runs out today but doesn't have a refill appt until 01/09/19. Please call her back.

## 2018-12-05 NOTE — Telephone Encounter (Signed)
Patient has two scripts at pharmacy that she has not filled. Called and informed.

## 2018-12-08 ENCOUNTER — Encounter: Payer: Self-pay | Admitting: Family

## 2018-12-08 ENCOUNTER — Ambulatory Visit (INDEPENDENT_AMBULATORY_CARE_PROVIDER_SITE_OTHER): Payer: Medicare Other | Admitting: Family

## 2018-12-08 VITALS — BP 122/72 | HR 84 | Temp 98.5°F | Wt 197.6 lb

## 2018-12-08 DIAGNOSIS — R059 Cough, unspecified: Secondary | ICD-10-CM

## 2018-12-08 DIAGNOSIS — R05 Cough: Secondary | ICD-10-CM

## 2018-12-08 DIAGNOSIS — R053 Chronic cough: Secondary | ICD-10-CM

## 2018-12-08 DIAGNOSIS — M48061 Spinal stenosis, lumbar region without neurogenic claudication: Secondary | ICD-10-CM

## 2018-12-08 MED ORDER — FLUTICASONE PROPIONATE 50 MCG/ACT NA SUSP: 2.0000 | Freq: Every day | NASAL | 3 refills | Status: DC

## 2018-12-08 NOTE — Progress Notes (Addendum)
Subjective:    Patient ID: Melody Ochoa, female    DOB: [birth year], 78 y.o.   MRN: [id number]  CC: Melody Ochoa is a 78 y.o. female who presents today for an acute visit.    HPI: CC: 'unsteady walking', slow progression over past couple of months.  Now using cane. Fallen 5 times prior using cane, no falls since using cane more regularly. No LOC,head injury with falls. Sleeps in bed a lot of day due to low back pain.   Feels like 'loosing ability to walk.'  Has been referred to PT however went one time , 2 months ago however didn't see much benefit.   Chronic left thigh numbness. States that chronic  decreases desire to exercise. No vision changes, headaches.   Used to be very active with swimming.  H/o CVA 09/2017  Also complains of cough and congestion for months, better this past month and since OV here 10/2018.   Cough at bedtime is improved. Some wheezing. No sob, chest pain, sinus pain, ear pain, fever.  Using ventolin and not sure if helping; uses albuterol 2-4 times per day. Has been delsym with some relief. Using nasal spray however unsure of name.  Former smoker; quit 35 years ago.  No epigastric  burning, belching.    Chronic back pain- following with Dr Holley Raring, taking cymbalta, lyrica,  Depression- following with Dr Ardis Rowan pychiatrisist, feels well on  wellbutrin.   Loop recorder- following with Dr Caryl Comes.   Seen here 10/2018- given prednisone, doxycycline, tessalon with temporarily improvement.  Dr Montel Culver- following for lung nodule and pulmonary fibrosis  MRI lumbar spine- moderate to severe foraminal stenosis.   UTD mammogram.   HISTORY:  Past Medical History:  Diagnosis Date  . Arthritis   . Atheromatous plaque 10/18/2017  . Back pain   . Cancer ([place])    skin  . Depression   . Diverticulosis   . Dizziness    patient had episode of dizziness when came in room. hx past no dx  . GERD (gastroesophageal reflux disease)   . Heart murmur   .  Hyperlipidemia 10/18/2017  . PONV (postoperative nausea and vomiting)    yrs ago  . Stroke [facility])    Past Surgical History:  Procedure Laterality Date  . BACK SURGERY  13  . BREAST BIOPSY Bilateral    cores "years ago"  . BREAST EXCISIONAL BIOPSY Left 90's  . CERVICAL DISC SURGERY  09  . COLONOSCOPY WITH PROPOFOL N/A 01/13/2016   Procedure: COLONOSCOPY WITH PROPOFOL;  Surgeon: [name], MD;  Location: [facility] ENDOSCOPY;  Service: Endoscopy;  Laterality: N/A;  . FACELIFT  94  . LOOP RECORDER INSERTION N/A 08/23/2017   Procedure: LOOP RECORDER INSERTION;  Surgeon: Thompson Grayer, MD;  Location: [facility];  Service: Cardiovascular;  Laterality: N/A;  . TEE WITHOUT CARDIOVERSION N/A 08/12/2017   Procedure: TRANSESOPHAGEAL ECHOCARDIOGRAM (TEE);  Surgeon: Sueanne Margarita, MD;  Location: [facility] ENDOSCOPY;  Service: Cardiovascular;  Laterality: N/A;  . TONSILLECTOMY    . TOTAL HIP ARTHROPLASTY Right 04/22/2016   Procedure: TOTAL HIP ARTHROPLASTY;  Surgeon: Corky Mull, MD;  Location: [facility] ORS;  Service: Orthopedics;  Laterality: Right;   Family History  Problem Relation Age of Onset  . Lung cancer Father   . Aneurysm Brother   . Stroke Brother   . Diabetes Maternal Grandmother   . Kidney disease Maternal Grandfather   . Cushing syndrome Paternal Grandmother   . Dementia Paternal Grandfather   .  Breast cancer Maternal Aunt 52    Allergies: Aleve [naproxen sodium]; Aspirin; Celebrex [celecoxib]; Effexor [venlafaxine]; Gabapentin; Hydrocodone-homatropine; Ibuprofen; Tape; Vioxx [rofecoxib]; Other; and Tramadol Current Outpatient Medications on File Prior to Visit  Medication Sig Dispense Refill  . atorvastatin (LIPITOR) 80 MG tablet Take 1 tablet (80 mg total) daily at 6 PM by mouth. 90 tablet 3  . buPROPion (WELLBUTRIN XL) 300 MG 24 hr tablet Take 300 mg by mouth daily.     . Calcium Carbonate-Vitamin D (CALTRATE 600+D PO) Take 1 tablet by mouth daily.    . clopidogrel (PLAVIX) 75 MG  tablet Take 1 tablet (75 mg total) by mouth daily. 30 tablet 0  . Cyanocobalamin (VITAMIN B-12 PO) Take 1 tablet by mouth daily.    . DULoxetine (CYMBALTA) 60 MG capsule Take 60 mg by mouth 2 (two) times daily.     . famotidine (PEPCID) 20 MG tablet TAKE 1 TABLET BY MOUTH EVERYDAY AT BEDTIME *NOT COVERED*  12  . ferrous sulfate 325 (65 FE) MG tablet Take 1 tablet (325 mg total) by mouth daily. 30 tablet 0  . [START ON 12/22/2018] oxyCODONE-acetaminophen (PERCOCET) 5-325 MG tablet Take 1-2 tablets by mouth daily as needed for severe pain. 60 tablet 0  . oxyCODONE-acetaminophen (PERCOCET) 5-325 MG tablet Take 1-2 tablets by mouth daily as needed for severe pain. 60 tablet 0  . pregabalin (LYRICA) 50 MG capsule Take 1 capsule (50 mg total) by mouth at bedtime for 15 days, THEN 1 capsule (50 mg total) 2 (two) times daily for 15 days. 45 capsule 2  . triamcinolone cream (KENALOG) 0.1 % Apply 1 application topically as needed.  0  . VENTOLIN HFA 108 (90 Base) MCG/ACT inhaler TAKE 2 PUFFS BY MOUTH EVERY 6 HOURS AS NEEDED FOR WHEEZE OR SHORTNESS OF BREATH 18 Inhaler 0  . vitamin C (ASCORBIC ACID) 500 MG tablet Take 500 mg by mouth daily.     No current facility-administered medications on file prior to visit.     Social History   Tobacco Use  . Smoking status: Former Smoker    Packs/day: 1.00    Years: 40.00    Pack years: 40.00    Types: Cigarettes    Last attempt to quit: 11/13/1996    Years since quitting: 22.0  . Smokeless tobacco: Never Used  Substance Use Topics  . Alcohol use: Yes    Comment: occ wine  . Drug use: No    Review of Systems  Constitutional: Negative for chills and fever.  Respiratory: Positive for cough and wheezing. Negative for shortness of breath.   Cardiovascular: Negative for chest pain, palpitations and leg swelling.  Gastrointestinal: Negative for nausea and vomiting.  Neurological: Positive for weakness (lower extremity).      Objective:    BP 122/72 (BP  Location: Left Arm, Patient Position: Sitting, Cuff Size: Large)   Pulse 84   Temp 98.5 F (36.9 C)   Wt 197 lb 9.6 oz (89.6 kg)   SpO2 97%   BMI 33.92 kg/m    Physical Exam Vitals signs reviewed.  Constitutional:      Appearance: She is well-developed.  HENT:     Head: Normocephalic and atraumatic.     Right Ear: Hearing, tympanic membrane, ear canal and external ear normal. No decreased hearing noted. No drainage, swelling or tenderness. No middle ear effusion. No foreign body. Tympanic membrane is not erythematous or bulging.     Left Ear: Hearing, tympanic membrane, ear canal and external  ear normal. No decreased hearing noted. No drainage, swelling or tenderness.  No middle ear effusion. No foreign body. Tympanic membrane is not erythematous or bulging.     Nose: Nose normal. No rhinorrhea.     Right Sinus: No maxillary sinus tenderness or frontal sinus tenderness.     Left Sinus: No maxillary sinus tenderness or frontal sinus tenderness.     Mouth/Throat:     Pharynx: Uvula midline. No oropharyngeal exudate or posterior oropharyngeal erythema.     Tonsils: No tonsillar abscesses.  Eyes:     Conjunctiva/sclera: Conjunctivae normal.  Cardiovascular:     Rate and Rhythm: Regular rhythm.     Pulses: Normal pulses.     Heart sounds: Normal heart sounds.  Pulmonary:     Effort: Pulmonary effort is normal.     Breath sounds: Normal breath sounds. No wheezing, rhonchi or rales.  Lymphadenopathy:     Head:     Right side of head: No submental, submandibular, tonsillar, preauricular, posterior auricular or occipital adenopathy.     Left side of head: No submental, submandibular, tonsillar, preauricular, posterior auricular or occipital adenopathy.     Cervical: No cervical adenopathy.  Skin:    General: Skin is warm and dry.  Neurological:     Mental Status: She is alert.  Psychiatric:        Speech: Speech normal.        Behavior: Behavior normal.        Thought Content:  Thought content normal.        Assessment & Plan:   Problem List Items Addressed This Visit      Musculoskeletal and Integument   Foraminal stenosis of lumbar region - Primary    At this time, I suspect overall deconditioning, likely due to chronic pain has caused patient to lose lower extremity strength, as well as desire.  Exercise appears to exacerbate her pain so patient stays in bed most the day.  Patient and I discussed this at great length and we jointly agreed home health referral to evaluate safety at home as well as establish safe exercises that she can do is an appropriate first step.  Patient will continue following with pain management to optimize pain control.  Close follow-up      Relevant Orders   Ambulatory referral to [place]     Other   Cough    Acute on chronic.   Patient is in no acute respiratory stress.  SaO2 97%.  Advised her  rhinitis may exacerbating symptoms, she will start Flonase at home.  We also discussed acid reflux.  Discussion in regards to Symbicort, however patient follows with pulmonology for pulmonary fibrosis, history of smoking ; I messaged Dr. Ashby Dawes in regards to this.  Will await his advice.   Patient will let me know if not better       Other Visit Diagnoses    Chronic cough       Relevant Medications   fluticasone (FLONASE) 50 MCG/ACT nasal spray        I have discontinued Eara Burruel. [place]'s benzonatate. I am also having her maintain her DULoxetine, Calcium Carbonate-Vitamin D (CALTRATE 600+D PO), buPROPion, vitamin C, Cyanocobalamin (VITAMIN B-12 PO), ferrous sulfate, atorvastatin, triamcinolone cream, oxyCODONE-acetaminophen, oxyCODONE-acetaminophen, VENTOLIN HFA, famotidine, pregabalin, and clopidogrel.   No orders of the defined types were placed in this encounter.   Return precautions given.   Risks, benefits, and alternatives of the medications and treatment plan prescribed today were discussed,  and patient  expressed understanding.   Education regarding symptom management and diagnosis given to patient on AVS.  Continue to follow with Burnard Hawthorne, FNP for routine health maintenance.   [name] and I agreed with plan.   [name], FNP    I have reviewed the above information and agree with above.   Deborra Medina, MD

## 2018-12-08 NOTE — Patient Instructions (Signed)
Start flonase Today we discussed referrals, orders. homehealth   I have placed these orders in the system for you.  Please be sure to give [place] a call if you have not heard from our office regarding this. We should hear from [place] within ONE week with information regarding your appointment. If not, please let me know immediately.   I will get back to you after I hear from Dr Ashby Dawes

## 2018-12-08 NOTE — Assessment & Plan Note (Addendum)
At this time, I suspect overall deconditioning, likely due to chronic pain has caused patient to lose lower extremity strength, as well as desire.  Exercise appears to exacerbate her pain so patient stays in bed most the day.  Patient and I discussed this at great length and we jointly agreed home health referral to evaluate safety at home as well as establish safe exercises that she can do is an appropriate first step.  Patient will continue following with pain management to optimize pain control.  Close follow-up

## 2018-12-10 ENCOUNTER — Encounter: Payer: Self-pay | Admitting: Family

## 2018-12-10 NOTE — Assessment & Plan Note (Signed)
Acute on chronic.   Patient is in no acute respiratory stress.  SaO2 97%.  Advised her  rhinitis may exacerbating symptoms, she will start Flonase at home.  We also discussed acid reflux.  Discussion in regards to Symbicort, however patient follows with pulmonology for pulmonary fibrosis, history of smoking ; I messaged Dr. Ashby Dawes in regards to this.  Will await his advice.   Patient will let me know if not better

## 2018-12-12 ENCOUNTER — Other Ambulatory Visit: Payer: Self-pay | Admitting: *Deleted

## 2018-12-12 MED ORDER — CLOPIDOGREL BISULFATE 75 MG PO TABS: 75.0000 mg | ORAL_TABLET | Freq: Every day | ORAL | 0 refills | Status: DC

## 2018-12-13 ENCOUNTER — Other Ambulatory Visit: Payer: Self-pay | Admitting: *Deleted

## 2018-12-13 MED ORDER — ATORVASTATIN CALCIUM 80 MG PO TABS: 80.0000 mg | ORAL_TABLET | Freq: Every day | ORAL | 0 refills | Status: DC

## 2018-12-14 ENCOUNTER — Other Ambulatory Visit: Payer: Self-pay

## 2018-12-14 DIAGNOSIS — H2512 Age-related nuclear cataract, left eye: Secondary | ICD-10-CM | POA: Diagnosis not present

## 2018-12-14 NOTE — Discharge Instructions (Signed)

## 2018-12-15 ENCOUNTER — Telehealth: Payer: Self-pay | Admitting: Family

## 2018-12-15 ENCOUNTER — Other Ambulatory Visit: Payer: Self-pay | Admitting: Family

## 2018-12-15 DIAGNOSIS — J40 Bronchitis, not specified as acute or chronic: Secondary | ICD-10-CM

## 2018-12-15 MED ORDER — DOXYCYCLINE HYCLATE 100 MG PO TABS: 100.0000 mg | ORAL_TABLET | Freq: Two times a day (BID) | ORAL | 0 refills | Status: DC

## 2018-12-15 NOTE — Telephone Encounter (Signed)
Patient saw Provider on 12/08/18 and she is wanting to know, if she has a lung problem, the cough has gotten worse and she wants and ABX called in, patient has active cough  Yellow mucus, is not a large amount , no fever or chills , has drainage in back throat.Melody Ochoa

## 2018-12-15 NOTE — Telephone Encounter (Signed)
I left a detailed VM that prescription was sent, but to please call back our office to advise on below.

## 2018-12-15 NOTE — Telephone Encounter (Signed)
Copied from [place] [id number]. Topic: Quick Communication - See Telephone Encounter >> Dec 15, 2018 11:50 AM Blase Mess A wrote: CRM for notification. See Telephone encounter for: 12/15/18.  Patient is calling because she saw [name] the other day  and she has a terrible cough.   She was not given an antibotic. She wants to know does she have a lund disease? Please advise. Thank you. [phone]

## 2018-12-15 NOTE — Telephone Encounter (Signed)
Call patient I am sorry that her cough has gotten worse.  One has been doxycycline.  Please ask her to stay very vigilant, particular she does have pulmonary fibrosis.  Please advise I did not hear back from her pulmonologist, Dr. Ashby Dawes, in regards to starting medication such as a Symbicort.  Please let me know if she is any wheezing, shortness of breath.  Please also educate patient that she needs to continue using her Ventolin inhaler as prescribed. Advise probiotics as well

## 2018-12-16 ENCOUNTER — Other Ambulatory Visit: Payer: Self-pay | Admitting: Family

## 2018-12-16 DIAGNOSIS — J8410 Pulmonary fibrosis, unspecified: Secondary | ICD-10-CM

## 2018-12-16 DIAGNOSIS — R05 Cough: Secondary | ICD-10-CM

## 2018-12-16 DIAGNOSIS — R058 Other specified cough: Secondary | ICD-10-CM

## 2018-12-18 ENCOUNTER — Ambulatory Visit (INDEPENDENT_AMBULATORY_CARE_PROVIDER_SITE_OTHER): Payer: Medicare Other

## 2018-12-18 DIAGNOSIS — I639 Cerebral infarction, unspecified: Secondary | ICD-10-CM

## 2018-12-18 NOTE — Anesthesia Preprocedure Evaluation (Addendum)
Anesthesia Evaluation  Patient identified by MRN, date of birth, ID band Patient awake    Reviewed: Allergy & Precautions, NPO status , Patient's Chart, lab work & pertinent test results  History of Anesthesia Complications (+) PONV and history of anesthetic complications  Airway Mallampati: I   Neck ROM: Full    Dental  (+) Partial Lower   Pulmonary former smoker (quit 1997),    Pulmonary exam normal breath sounds clear to auscultation       Cardiovascular + Valvular Problems/Murmurs (murmur)  Rhythm:Regular Rate:Normal + Systolic murmurs On Plavix   Neuro/Psych PSYCHIATRIC DISORDERS Anxiety Depression CVA (08/2017), No Residual Symptoms    GI/Hepatic GERD  ,  Endo/Other  negative endocrine ROS  Renal/GU negative Renal ROS     Musculoskeletal  (+) Arthritis ,   Abdominal   Peds  Hematology negative hematology ROS (+)   Anesthesia Other Findings   Reproductive/Obstetrics                            Anesthesia Physical Anesthesia Plan  ASA: III  Anesthesia Plan: MAC   Post-op Pain Management:    Induction: Intravenous  PONV Risk Score and Plan: 3 and TIVA, Midazolam and Treatment may vary due to age or medical condition  Airway Management Planned: Natural Airway  Additional Equipment:   Intra-op Plan:   Post-operative Plan:   Informed Consent: I have reviewed the patients History and Physical, chart, labs and discussed the procedure including the risks, benefits and alternatives for the proposed anesthesia with the patient or authorized representative who has indicated his/her understanding and acceptance.       Plan Discussed with: CRNA  Anesthesia Plan Comments:        Anesthesia Quick Evaluation

## 2018-12-18 NOTE — Telephone Encounter (Signed)
I spoke with patient & she did pick up doxy. She conformed no SOB or wheezing.

## 2018-12-19 DIAGNOSIS — F325 Major depressive disorder, single episode, in full remission: Secondary | ICD-10-CM | POA: Diagnosis not present

## 2018-12-19 LAB — CUP PACEART REMOTE DEVICE CHECK
Date Time Interrogation Session: 20200111214141
Implantable Pulse Generator Implant Date: 20180918

## 2018-12-19 NOTE — Progress Notes (Signed)
Carelink Summary Report / Loop Recorder 

## 2018-12-20 ENCOUNTER — Telehealth: Payer: Self-pay | Admitting: Family

## 2018-12-20 ENCOUNTER — Ambulatory Visit
Admission: RE | Admit: 2018-12-20 | Discharge: 2018-12-20 | Disposition: A | Discharge status: Home / Self Care | Payer: Medicare Other | Attending: Ophthalmology | Admitting: Ophthalmology

## 2018-12-20 ENCOUNTER — Encounter
Admission: RE | Disposition: A | Discharge status: Home / Self Care | Payer: Self-pay | Source: Home / Self Care | Attending: Ophthalmology

## 2018-12-20 ENCOUNTER — Ambulatory Visit: Payer: Medicare Other | Admitting: Anesthesiology

## 2018-12-20 DIAGNOSIS — H25812 Combined forms of age-related cataract, left eye: Secondary | ICD-10-CM | POA: Diagnosis not present

## 2018-12-20 DIAGNOSIS — Z79899 Other long term (current) drug therapy: Secondary | ICD-10-CM | POA: Insufficient documentation

## 2018-12-20 DIAGNOSIS — E7800 Pure hypercholesterolemia, unspecified: Secondary | ICD-10-CM | POA: Diagnosis not present

## 2018-12-20 DIAGNOSIS — Z85828 Personal history of other malignant neoplasm of skin: Secondary | ICD-10-CM | POA: Insufficient documentation

## 2018-12-20 DIAGNOSIS — H2512 Age-related nuclear cataract, left eye: Secondary | ICD-10-CM | POA: Diagnosis not present

## 2018-12-20 DIAGNOSIS — Z87891 Personal history of nicotine dependence: Secondary | ICD-10-CM | POA: Diagnosis not present

## 2018-12-20 DIAGNOSIS — F329 Major depressive disorder, single episode, unspecified: Secondary | ICD-10-CM | POA: Insufficient documentation

## 2018-12-20 HISTORY — PX: CATARACT EXTRACTION W/PHACO: SHX586

## 2018-12-20 SURGERY — PHACOEMULSIFICATION, CATARACT, WITH IOL INSERTION
Anesthesia: Monitor Anesthesia Care | Site: Eye | Laterality: Left

## 2018-12-20 MED ORDER — ACETAMINOPHEN 325 MG PO TABS: 650.0000 mg | ORAL_TABLET | Freq: Once | ORAL | Status: DC | PRN

## 2018-12-20 MED ORDER — BRIMONIDINE TARTRATE-TIMOLOL 0.2-0.5 % OP SOLN
OPHTHALMIC | Status: DC | PRN
  Administered 2018-12-20: 1 [drp] via OPHTHALMIC

## 2018-12-20 MED ORDER — NA HYALUR & NA CHOND-NA HYALUR 0.4-0.35 ML IO KIT
PACK | INTRAOCULAR | Status: DC | PRN
  Administered 2018-12-20: 1 mL via INTRAOCULAR

## 2018-12-20 MED ORDER — LIDOCAINE HCL (PF) 2 % IJ SOLN
INTRAOCULAR | Status: DC | PRN
  Administered 2018-12-20: 1 mL

## 2018-12-20 MED ORDER — ARMC OPHTHALMIC DILATING DROPS
1.0000 "application " | OPHTHALMIC | Status: DC | PRN
  Administered 2018-12-20 (×3): 1 via OPHTHALMIC

## 2018-12-20 MED ORDER — FENTANYL CITRATE (PF) 100 MCG/2ML IJ SOLN
INTRAMUSCULAR | Status: DC | PRN
  Administered 2018-12-20: 50 ug via INTRAVENOUS

## 2018-12-20 MED ORDER — EPINEPHRINE PF 1 MG/ML IJ SOLN
INTRAOCULAR | Status: DC | PRN
  Administered 2018-12-20: 57 mL via OPHTHALMIC

## 2018-12-20 MED ORDER — ACETAMINOPHEN 160 MG/5ML PO SOLN: 325.0000 mg | ORAL | Status: DC | PRN

## 2018-12-20 MED ORDER — MIDAZOLAM HCL 2 MG/2ML IJ SOLN
INTRAMUSCULAR | Status: DC | PRN
  Administered 2018-12-20 (×2): 1 mg via INTRAVENOUS

## 2018-12-20 MED ORDER — ONDANSETRON HCL 4 MG/2ML IJ SOLN: 4.0000 mg | Freq: Once | INTRAMUSCULAR | Status: DC | PRN

## 2018-12-20 MED ORDER — CEFUROXIME OPHTHALMIC INJECTION 1 MG/0.1 ML
INJECTION | OPHTHALMIC | Status: DC | PRN
  Administered 2018-12-20: 0.1 mL via INTRACAMERAL

## 2018-12-20 MED ORDER — LACTATED RINGERS IV SOLN: INTRAVENOUS | Status: DC

## 2018-12-20 MED ORDER — MOXIFLOXACIN HCL 0.5 % OP SOLN
1.0000 [drp] | OPHTHALMIC | Status: DC | PRN
  Administered 2018-12-20 (×3): 1 [drp] via OPHTHALMIC

## 2018-12-20 MED ORDER — TETRACAINE HCL 0.5 % OP SOLN
1.0000 [drp] | OPHTHALMIC | Status: DC | PRN
  Administered 2018-12-20 (×2): 1 [drp] via OPHTHALMIC

## 2018-12-20 SURGICAL SUPPLY — 20 items
CANNULA ANT/CHMB 27G (MISCELLANEOUS) ×1 IMPLANT
CANNULA ANT/CHMB 27GA (MISCELLANEOUS) ×2 IMPLANT
GLOVE SURG LX 7.5 STRW (GLOVE) ×1
GLOVE SURG LX STRL 7.5 STRW (GLOVE) ×1 IMPLANT
GLOVE SURG TRIUMPH 8.0 PF LTX (GLOVE) ×2 IMPLANT
GOWN STRL REUS W/ TWL LRG LVL3 (GOWN DISPOSABLE) ×2 IMPLANT
GOWN STRL REUS W/TWL LRG LVL3 (GOWN DISPOSABLE) ×2
LENS IOL TECNIS ITEC 28.5 (Intraocular Lens) ×1 IMPLANT
MARKER SKIN DUAL TIP RULER LAB (MISCELLANEOUS) ×2 IMPLANT
NDL FILTER BLUNT 18X1 1/2 (NEEDLE) ×1 IMPLANT
NEEDLE FILTER BLUNT 18X 1/2SAF (NEEDLE) ×1
NEEDLE FILTER BLUNT 18X1 1/2 (NEEDLE) ×1 IMPLANT
PACK CATARACT BRASINGTON (MISCELLANEOUS) ×2 IMPLANT
PACK EYE AFTER SURG (MISCELLANEOUS) ×2 IMPLANT
PACK OPTHALMIC (MISCELLANEOUS) ×2 IMPLANT
SYR 3ML LL SCALE MARK (SYRINGE) ×2 IMPLANT
SYR 5ML LL (SYRINGE) ×2 IMPLANT
SYR TB 1ML LUER SLIP (SYRINGE) ×2 IMPLANT
WATER STERILE IRR 500ML POUR (IV SOLUTION) ×2 IMPLANT
WIPE NON LINTING 3.25X3.25 (MISCELLANEOUS) ×2 IMPLANT

## 2018-12-20 NOTE — Transfer of Care (Signed)
Immediate Anesthesia Transfer of Care Note  Patient: Melody Ochoa  Procedure(s) Performed: CATARACT EXTRACTION PHACO AND INTRAOCULAR LENS PLACEMENT (IOC) LEFT TOPICAL (Left Eye)  Patient Location: PACU  Anesthesia Type: MAC  Level of Consciousness: awake, alert  and patient cooperative  Airway and Oxygen Therapy: Patient Spontanous Breathing and Patient connected to supplemental oxygen  Post-op Assessment: Post-op Vital signs reviewed, Patient's Cardiovascular Status Stable, Respiratory Function Stable, Patent Airway and No signs of Nausea or vomiting  Post-op Vital Signs: Reviewed and stable  Complications: No apparent anesthesia complications

## 2018-12-20 NOTE — Op Note (Signed)
OPERATIVE NOTE  Melody Ochoa [id number] 12/20/2018   PREOPERATIVE DIAGNOSIS:  Nuclear sclerotic cataract left eye. H25.12   POSTOPERATIVE DIAGNOSIS:    Nuclear sclerotic cataract left eye.     PROCEDURE:  Phacoemusification with posterior chamber intraocular lens placement of the left eye   LENS:   Implant Name Type Inv. Item Serial No. Manufacturer Lot No. LRB No. Used  LENS IOL DIOP 28.5 - [id number] Intraocular Lens LENS IOL DIOP 28.5 [id number]  Left 1        ULTRASOUND TIME: 11  % of 0 minutes 44 seconds, CDE 4.7  SURGEON:  Wyonia Hough, MD   ANESTHESIA:  Topical with tetracaine drops and 2% Xylocaine jelly, augmented with 1% preservative-free intracameral lidocaine.    COMPLICATIONS:  None.   DESCRIPTION OF PROCEDURE:  The patient was identified in the holding room and transported to the operating room and placed in the supine position under the operating microscope.  The left eye was identified as the operative eye and it was prepped and draped in the usual sterile ophthalmic fashion.   A 1 millimeter clear-corneal paracentesis was made at the 1:30 position.  0.5 ml of preservative-free 1% lidocaine was injected into the anterior chamber.  The anterior chamber was filled with Viscoat viscoelastic.  A 2.4 millimeter keratome was used to make a near-clear corneal incision at the 10:30 position.  .  A curvilinear capsulorrhexis was made with a cystotome and capsulorrhexis forceps.  Balanced salt solution was used to hydrodissect and hydrodelineate the nucleus.   Phacoemulsification was then used in stop and chop fashion to remove the lens nucleus and epinucleus.  The remaining cortex was then removed using the irrigation and aspiration handpiece. Provisc was then placed into the capsular bag to distend it for lens placement.  A lens was then injected into the capsular bag.  The remaining viscoelastic was aspirated.   Wounds were hydrated with balanced salt  solution.  The anterior chamber was inflated to a physiologic pressure with balanced salt solution.  No wound leaks were noted. Cefuroxime 0.1 ml of a 10mg /ml solution was injected into the anterior chamber for a dose of 1 mg of intracameral antibiotic at the completion of the case.   Timolol and Brimonidine drops were applied to the eye.  The patient was taken to the recovery room in stable condition without complications of anesthesia or surgery.  [name] 12/20/2018, 10:22 AM

## 2018-12-20 NOTE — Anesthesia Procedure Notes (Signed)
Procedure Name: MAC Date/Time: 12/20/2018 10:08 AM Performed by: Cameron Ali, CRNA Pre-anesthesia Checklist: Patient identified, Emergency Drugs available, Suction available, Timeout performed and Patient being monitored Patient Re-evaluated:Patient Re-evaluated prior to induction Oxygen Delivery Method: Nasal cannula Placement Confirmation: positive ETCO2

## 2018-12-20 NOTE — H&P (Signed)

## 2018-12-20 NOTE — Telephone Encounter (Signed)
Verbals given to change start of care to 12/21/2018 instead of today

## 2018-12-20 NOTE — Telephone Encounter (Signed)
Copied from [place] [id number]. Topic: Quick Communication - Home Health Verbal Orders >> Dec 20, 2018  2:53 PM Conception Chancy, NT wrote: Caller/Agency: [facility] Number: [phone] Requesting OT/PT/Skilled Nursing/Social Work: Physical Therapist Frequency: patient forgot she had appointment and had eye surgery today. They will start of care tomorrow 12/21/18

## 2018-12-20 NOTE — Anesthesia Postprocedure Evaluation (Signed)
Anesthesia Post Note  Patient: Melody Ochoa  Procedure(s) Performed: CATARACT EXTRACTION PHACO AND INTRAOCULAR LENS PLACEMENT (IOC) LEFT TOPICAL (Left Eye)  Patient location during evaluation: PACU Anesthesia Type: MAC Level of consciousness: awake and alert, oriented and patient cooperative Pain management: pain level controlled Vital Signs Assessment: post-procedure vital signs reviewed and stable Respiratory status: spontaneous breathing, nonlabored ventilation and respiratory function stable Cardiovascular status: blood pressure returned to baseline and stable Postop Assessment: adequate PO intake Anesthetic complications: no    Darrin Nipper

## 2018-12-21 ENCOUNTER — Ambulatory Visit: Payer: Medicare Other | Admitting: Internal Medicine

## 2018-12-21 ENCOUNTER — Encounter: Payer: Self-pay | Admitting: Ophthalmology

## 2018-12-21 DIAGNOSIS — R05 Cough: Secondary | ICD-10-CM | POA: Diagnosis not present

## 2018-12-21 DIAGNOSIS — F339 Major depressive disorder, recurrent, unspecified: Secondary | ICD-10-CM | POA: Diagnosis not present

## 2018-12-21 DIAGNOSIS — J302 Other seasonal allergic rhinitis: Secondary | ICD-10-CM | POA: Diagnosis not present

## 2018-12-21 DIAGNOSIS — M48061 Spinal stenosis, lumbar region without neurogenic claudication: Secondary | ICD-10-CM | POA: Diagnosis not present

## 2018-12-21 DIAGNOSIS — R269 Unspecified abnormalities of gait and mobility: Secondary | ICD-10-CM | POA: Diagnosis not present

## 2018-12-21 DIAGNOSIS — J8410 Pulmonary fibrosis, unspecified: Secondary | ICD-10-CM | POA: Diagnosis not present

## 2018-12-24 LAB — CUP PACEART REMOTE DEVICE CHECK
Date Time Interrogation Session: 20191209211043
Implantable Pulse Generator Implant Date: 20180918

## 2018-12-27 DIAGNOSIS — J8410 Pulmonary fibrosis, unspecified: Secondary | ICD-10-CM | POA: Diagnosis not present

## 2018-12-27 DIAGNOSIS — R05 Cough: Secondary | ICD-10-CM | POA: Diagnosis not present

## 2018-12-27 DIAGNOSIS — J302 Other seasonal allergic rhinitis: Secondary | ICD-10-CM | POA: Diagnosis not present

## 2018-12-27 DIAGNOSIS — Z9181 History of falling: Secondary | ICD-10-CM | POA: Diagnosis not present

## 2018-12-27 DIAGNOSIS — M48061 Spinal stenosis, lumbar region without neurogenic claudication: Secondary | ICD-10-CM | POA: Diagnosis not present

## 2018-12-27 DIAGNOSIS — Z8673 Personal history of transient ischemic attack (TIA), and cerebral infarction without residual deficits: Secondary | ICD-10-CM | POA: Diagnosis not present

## 2018-12-27 DIAGNOSIS — R269 Unspecified abnormalities of gait and mobility: Secondary | ICD-10-CM | POA: Diagnosis not present

## 2018-12-27 DIAGNOSIS — Z96641 Presence of right artificial hip joint: Secondary | ICD-10-CM | POA: Diagnosis not present

## 2018-12-27 DIAGNOSIS — Z87891 Personal history of nicotine dependence: Secondary | ICD-10-CM | POA: Diagnosis not present

## 2018-12-27 DIAGNOSIS — F339 Major depressive disorder, recurrent, unspecified: Secondary | ICD-10-CM | POA: Diagnosis not present

## 2018-12-29 DIAGNOSIS — M48061 Spinal stenosis, lumbar region without neurogenic claudication: Secondary | ICD-10-CM | POA: Diagnosis not present

## 2018-12-29 DIAGNOSIS — R269 Unspecified abnormalities of gait and mobility: Secondary | ICD-10-CM | POA: Diagnosis not present

## 2018-12-29 DIAGNOSIS — J8410 Pulmonary fibrosis, unspecified: Secondary | ICD-10-CM | POA: Diagnosis not present

## 2018-12-29 DIAGNOSIS — J302 Other seasonal allergic rhinitis: Secondary | ICD-10-CM | POA: Diagnosis not present

## 2018-12-29 DIAGNOSIS — R05 Cough: Secondary | ICD-10-CM | POA: Diagnosis not present

## 2018-12-29 DIAGNOSIS — F339 Major depressive disorder, recurrent, unspecified: Secondary | ICD-10-CM | POA: Diagnosis not present

## 2018-12-29 DIAGNOSIS — H2511 Age-related nuclear cataract, right eye: Secondary | ICD-10-CM | POA: Diagnosis not present

## 2018-12-30 DIAGNOSIS — R05 Cough: Secondary | ICD-10-CM | POA: Diagnosis not present

## 2018-12-30 DIAGNOSIS — J302 Other seasonal allergic rhinitis: Secondary | ICD-10-CM | POA: Diagnosis not present

## 2018-12-30 DIAGNOSIS — F339 Major depressive disorder, recurrent, unspecified: Secondary | ICD-10-CM | POA: Diagnosis not present

## 2018-12-30 DIAGNOSIS — M48061 Spinal stenosis, lumbar region without neurogenic claudication: Secondary | ICD-10-CM | POA: Diagnosis not present

## 2018-12-30 DIAGNOSIS — R269 Unspecified abnormalities of gait and mobility: Secondary | ICD-10-CM | POA: Diagnosis not present

## 2018-12-30 DIAGNOSIS — J8410 Pulmonary fibrosis, unspecified: Secondary | ICD-10-CM | POA: Diagnosis not present

## 2019-01-02 DIAGNOSIS — F339 Major depressive disorder, recurrent, unspecified: Secondary | ICD-10-CM | POA: Diagnosis not present

## 2019-01-02 DIAGNOSIS — J302 Other seasonal allergic rhinitis: Secondary | ICD-10-CM | POA: Diagnosis not present

## 2019-01-02 DIAGNOSIS — R269 Unspecified abnormalities of gait and mobility: Secondary | ICD-10-CM | POA: Diagnosis not present

## 2019-01-02 DIAGNOSIS — R05 Cough: Secondary | ICD-10-CM | POA: Diagnosis not present

## 2019-01-02 DIAGNOSIS — M48061 Spinal stenosis, lumbar region without neurogenic claudication: Secondary | ICD-10-CM | POA: Diagnosis not present

## 2019-01-02 DIAGNOSIS — J8410 Pulmonary fibrosis, unspecified: Secondary | ICD-10-CM | POA: Diagnosis not present

## 2019-01-03 ENCOUNTER — Other Ambulatory Visit: Payer: Self-pay | Admitting: Internal Medicine

## 2019-01-04 DIAGNOSIS — J8410 Pulmonary fibrosis, unspecified: Secondary | ICD-10-CM | POA: Diagnosis not present

## 2019-01-04 DIAGNOSIS — F339 Major depressive disorder, recurrent, unspecified: Secondary | ICD-10-CM | POA: Diagnosis not present

## 2019-01-04 DIAGNOSIS — R269 Unspecified abnormalities of gait and mobility: Secondary | ICD-10-CM | POA: Diagnosis not present

## 2019-01-04 DIAGNOSIS — R05 Cough: Secondary | ICD-10-CM | POA: Diagnosis not present

## 2019-01-04 DIAGNOSIS — M48061 Spinal stenosis, lumbar region without neurogenic claudication: Secondary | ICD-10-CM | POA: Diagnosis not present

## 2019-01-04 DIAGNOSIS — J302 Other seasonal allergic rhinitis: Secondary | ICD-10-CM | POA: Diagnosis not present

## 2019-01-08 DIAGNOSIS — F339 Major depressive disorder, recurrent, unspecified: Secondary | ICD-10-CM | POA: Diagnosis not present

## 2019-01-08 DIAGNOSIS — R269 Unspecified abnormalities of gait and mobility: Secondary | ICD-10-CM | POA: Diagnosis not present

## 2019-01-08 DIAGNOSIS — J302 Other seasonal allergic rhinitis: Secondary | ICD-10-CM | POA: Diagnosis not present

## 2019-01-08 DIAGNOSIS — R05 Cough: Secondary | ICD-10-CM | POA: Diagnosis not present

## 2019-01-08 DIAGNOSIS — M48061 Spinal stenosis, lumbar region without neurogenic claudication: Secondary | ICD-10-CM | POA: Diagnosis not present

## 2019-01-08 DIAGNOSIS — J8410 Pulmonary fibrosis, unspecified: Secondary | ICD-10-CM | POA: Diagnosis not present

## 2019-01-09 ENCOUNTER — Encounter: Payer: Self-pay | Admitting: Nurse Practitioner

## 2019-01-09 ENCOUNTER — Ambulatory Visit: Payer: Medicare Other | Attending: Nurse Practitioner | Admitting: Nurse Practitioner

## 2019-01-09 ENCOUNTER — Other Ambulatory Visit: Payer: Self-pay

## 2019-01-09 VITALS — BP 143/78 | HR 81 | Temp 97.8°F | Resp 16 | Ht 64.5 in | Wt 182.0 lb

## 2019-01-09 DIAGNOSIS — M4316 Spondylolisthesis, lumbar region: Secondary | ICD-10-CM | POA: Insufficient documentation

## 2019-01-09 DIAGNOSIS — M1612 Unilateral primary osteoarthritis, left hip: Secondary | ICD-10-CM | POA: Diagnosis not present

## 2019-01-09 DIAGNOSIS — Z79891 Long term (current) use of opiate analgesic: Secondary | ICD-10-CM | POA: Diagnosis not present

## 2019-01-09 DIAGNOSIS — G894 Chronic pain syndrome: Secondary | ICD-10-CM | POA: Insufficient documentation

## 2019-01-09 MED ORDER — OXYCODONE HCL 5 MG PO TABS: 2.5000 mg | ORAL_TABLET | Freq: Two times a day (BID) | ORAL | 0 refills | Status: DC | PRN

## 2019-01-09 MED ORDER — OXYCODONE-ACETAMINOPHEN 5-325 MG PO TABS: 1.0000 | ORAL_TABLET | Freq: Every day | ORAL | 0 refills | Status: DC | PRN

## 2019-01-09 MED ORDER — PREGABALIN 50 MG PO CAPS: 50.0000 mg | ORAL_CAPSULE | Freq: Two times a day (BID) | ORAL | 2 refills | Status: DC

## 2019-01-09 NOTE — Progress Notes (Signed)
Patient's Name: Melody Ochoa  MRN: [id number]  Referring Provider: Burnard Hawthorne, FNP  DOB: Jul 26, 1941  PCP: Burnard Hawthorne, FNP  DOS: 01/09/2019  Note by: Vevelyn Francois NP  Service setting: Ambulatory outpatient  Specialty: Interventional Pain Management  Location: [facility] (AMB) Pain Management Facility    Patient type: Established    Primary Reason(s) for Visit: Encounter for prescription drug management. (Level of risk: moderate)  CC: Back Pain (lower)  HPI  Melody Ochoa is a 78 y.o. year old, female patient, who comes today for a medication management evaluation. She has Spondylolisthesis of lumbar region; Failed back surgical syndrome; UTI (lower urinary tract infection); Low back pain; Personal history of colonic polyps; Status post total replacement of right hip; Stroke (cerebrum) (HCC) - Patchy small volume acute ischemic nonhemorrhagic superior left cerebellar infarct, left SCA territory, of undetermined etiology, with diffuse atherosclerosis; Depression, recurrent ([place]); Pulmonary nodules; History of lumbar surgery; Osteopenia; Hyperlipidemia; Rash; Lumbar radiculopathy; History of stroke; Greater trochanteric bursitis of both hips; Degeneration of lumbar or lumbosacral intervertebral disc; Major depression in remission ([place]); Primary osteoarthritis of left hip; Thoracic aortic atherosclerosis ([place]); Cough; SOB (shortness of breath) on exertion; Dysphagia; Chronic pain syndrome; Anxiety; B12 deficiency; Generalized OA; Left hip pain; Menopause; Prediabetes; Strain of left hip; Foraminal stenosis of lumbar region; and Long term current use of opiate analgesic on their problem list. Her primarily concern today is the Back Pain (lower)  Pain Assessment: Location: Lower Back Radiating: left hip around to front of leg to the knee Onset: More than a month ago Duration: Chronic pain Quality: Aching, Constant Severity: 6 /10 (subjective, self-reported pain score)  Note: Reported level  is compatible with observation. Clinically the patient looks like a 2/10 A 2/10 is viewed as "Mild to Moderate" and described as noticeable and distracting. Impossible to hide from other people. More frequent flare-ups. Still possible to adapt and function close to normal. It can be very annoying and may have occasional stronger flare-ups. With discipline, patients may get used to it and adapt. Information on the proper use of the pain scale provided to the patient today. When using our objective Pain Scale, levels between 6 and 10/10 are said to belong in an emergency room, as it progressively worsens from a 6/10, described as severely limiting, requiring emergency care not usually available at an outpatient pain management facility. At a 6/10 level, communication becomes difficult and requires great effort. Assistance to reach the emergency department may be required. Facial flushing and profuse sweating along with potentially dangerous increases in heart rate and blood pressure will be evident. Effect on ADL: "Its hard for me to do anything" Walking is hard Timing: Constant Modifying factors: rest, medications BP: (!) 143/78  HR: 81  Ms. Bencomo was last scheduled for an appointment on 12/05/2018 for medication management. During today's appointment we reviewed Ms. Hert's chronic pain status, as well as her outpatient medication regimen.  She was started on Lyrica.  She admits that it was effective however she only had a month supply so she has been out for several weeks.  She is concern about her options. She has failed interventional therapy on several occasions with Dr. Holley Raring [place].  She has been seen by Dr Cari Caraway in the past.she admits that she has a pinched nerve. She is not able to activity secondary to the pain. 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 She does not want to live  her life in pain/. She admits that she is fearful of falling while walking around  the pool.   The patient  reports no history of drug use. Her body mass index is 30.76 kg/m.  Further details on both, my assessment(s), as well as the proposed treatment plan, please see below.  Controlled Substance Pharmacotherapy Assessment REMS (Risk Evaluation and Mitigation Strategy)  Analgesic: Oxycodone-acetaminophen 5/325 1 to 2 tablets daily oxycodone 10 mg/day MME/day: 15 mg/day.  Ignatius Specking, RN  01/09/2019  2:19 PM  Sign when Signing Visit Nursing Pain Medication Assessment:  Safety precautions to be maintained throughout the outpatient stay will include: orient to surroundings, keep bed in low position, maintain call bell within reach at all times, provide assistance with transfer out of bed and ambulation.  Medication Inspection Compliance: Pill count conducted under aseptic conditions, in front of the patient. Neither the pills nor the bottle was removed from the patient's sight at any time. Once count was completed pills were immediately returned to the patient in their original bottle.  Medication: Oxycodone/APAP Pill/Patch Count: 12 of 60 pills remain Pill/Patch Appearance: Markings consistent with prescribed medication Bottle Appearance: Standard pharmacy container. Clearly labeled. Filled Date: 28 / 31 / 2019 Last Medication intake:  Today   Pharmacokinetics: Liberation and absorption (onset of action): WNL Distribution (time to peak effect): WNL Metabolism and excretion (duration of action): WNL         Pharmacodynamics: Desired effects: Analgesia: Ms. Loney reports >50% benefit. Functional ability: Patient reports that medication allows her to accomplish basic ADLs Clinically meaningful improvement in function (CMIF): Sustained CMIF goals met Perceived effectiveness: Described as relatively effective, allowing for increase in activities of daily living (ADL) Undesirable effects: Side-effects or Adverse reactions: None reported Monitoring: [place] PMP:  Online review of the past 93-monthperiod conducted. Compliant with practice rules and regulations Last UDS on record: Summary  Date Value Ref Range Status  02/15/2018 FINAL  Final    Comment:    ==================================================================== TOXAS[facility]RE COMP DRUG ANALYSIS,UR ==================================================================== Test                             Result       Flag       Units Drug Present and Declared for Prescription Verification   Gabapentin                     PRESENT      EXPECTED   Bupropion                      PRESENT      EXPECTED   Hydroxybupropion               PRESENT      EXPECTED    Hydroxybupropion is an expected metabolite of bupropion.   Duloxetine                     PRESENT      EXPECTED ==================================================================== Test                      Result    Flag   Units      Ref Range   Creatinine              90               mg/dL      >=20 ====================================================================  Declared Medications:  The flagging and interpretation on this report are based on the  following declared medications.  Unexpected results may arise from  inaccuracies in the declared medications.  **Note: The testing scope of this panel includes these medications:  Bupropion (Wellbutrin)  Duloxetine (Cymbalta)  Gabapentin  **Note: The testing scope of this panel does not include following  reported medications:  Atorvastatin (Lipitor)  Calcium carbonate (Calcium carbonate/Vitamin D)  Clopidogrel (Plavix)  Cyanocobalamin  Iron (Ferrous Sulfate)  Mupirocin (Bactroban)  Triamcinolone (Kenalog)  Vitamin C  Vitamin D (Calcium carbonate/Vitamin D) ==================================================================== For clinical consultation, please call [phone]. ====================================================================    UDS interpretation: Compliant           Medication Assessment Form: Reviewed. Patient indicates being compliant with therapy Treatment compliance: Compliant Risk Assessment Profile: Aberrant behavior: See prior evaluations. None observed or detected today Comorbid factors increasing risk of overdose: See prior notes. No additional risks detected today Opioid risk tool (ORT) (Total Score): 4 Personal History of Substance Abuse (SUD-Substance use disorder):  Alcohol: Negative  Illegal Drugs: Negative  Rx Drugs: Negative  ORT Risk Level calculation: Moderate Risk Risk of substance use disorder (SUD): Low Opioid Risk Tool - 01/09/19 1417      Family History of Substance Abuse   Alcohol  Positive Female    Illegal Drugs  Negative    Rx Drugs  Negative      Personal History of Substance Abuse   Alcohol  Negative    Illegal Drugs  Negative    Rx Drugs  Negative      Age   Age between 5-45 years   No      Psychological Disease   Psychological Disease  Positive    ADD  Negative    OCD  Negative    Bipolar  Negative    Schizophrenia  Negative    Depression  Positive      Total Score   Opioid Risk Tool Scoring  4    Opioid Risk Interpretation  Moderate Risk      ORT Scoring interpretation table:  Score <3 = Low Risk for SUD  Score between 4-7 = Moderate Risk for SUD  Score >8 = High Risk for Opioid Abuse   Risk Mitigation Strategies:  Patient Counseling: Covered Patient-Prescriber Agreement (PPA): Present and active  Notification to other healthcare providers: Done  Pharmacologic Plan: No change in therapy, at this time.             Laboratory Chemistry  Inflammation Markers (CRP: Acute Phase) (ESR: Chronic Phase) No results found for: CRP, ESRSEDRATE, LATICACIDVEN                       Rheumatology Markers Lab Results  Component Value Date   RF <10.0 08/25/2018                        Renal Function Markers Lab Results  Component Value Date   BUN 12 04/17/2018   CREATININE 0.84 04/17/2018    GFRAA >60 04/17/2018   GFRNONAA >60 04/17/2018                             Hepatic Function Markers Lab Results  Component Value Date   AST 19 04/17/2018   ALT 17 04/17/2018   ALBUMIN 4.2 04/17/2018   ALKPHOS 64 04/17/2018  Electrolytes Lab Results  Component Value Date   NA 135 04/17/2018   K 3.7 04/17/2018   CL 99 (L) 04/17/2018   CALCIUM 8.8 (L) 04/17/2018                        Neuropathy Markers Lab Results  Component Value Date   VITAMINB12 334 11/23/2014   FOLATE >20.0 11/23/2014   HGBA1C 5.3 08/10/2017                        CNS Tests No results found for: COLORCSF, APPEARCSF, RBCCOUNTCSF, WBCCSF, POLYSCSF, LYMPHSCSF, EOSCSF, PROTEINCSF, GLUCCSF, JCVIRUS, CSFOLI, IGGCSF                      Bone Pathology Markers No results found for: VD25OH, [id number], [id number], [id number], 25OHVITD1, 25OHVITD2, 25OHVITD3, TESTOFREE, TESTOSTERONE                       Coagulation Parameters Lab Results  Component Value Date   INR 0.91 08/09/2017   LABPROT 12.2 08/09/2017   APTT 27 08/09/2017   PLT 299 04/17/2018                        Cardiovascular Markers Lab Results  Component Value Date   TROPONINI <0.03 08/19/2017   HGB 11.6 (L) 04/17/2018   HCT 34.3 (L) 04/17/2018                         CA Markers No results found for: CEA, CA125, LABCA2                      Endocrine Markers No results found for: TSH, FREET4, TESTOFREE, TESTOSTERONE, ESTRADIOL, ESTRADIOLPCT, ESTRADIOLFRE                      Note: Lab results reviewed.  Recent Diagnostic Imaging Results  CUP PACEART REMOTE DEVICE CHECK Carelink summary report received. Battery status OK. Normal device function. No new symptom episodes, tachy episodes, brady, or pause episodes. No new AF episodes. Monthly summary reports and ROV/PRN  Complexity Note: Imaging results reviewed. Results shared with Ms. Garant, using Layman's terms.                         Meds   Current  Outpatient Medications:  .  acetaminophen (TYLENOL) 500 MG tablet, Take 500 mg by mouth every 6 (six) hours as needed., Disp: , Rfl:  .  albuterol (PROVENTIL HFA;VENTOLIN HFA) 108 (90 Base) MCG/ACT inhaler, TAKE 2 PUFFS BY MOUTH EVERY 6 HOURS AS NEEDED FOR WHEEZE OR SHORTNESS OF BREATH, Disp: 18 Inhaler, Rfl: 0 .  atorvastatin (LIPITOR) 80 MG tablet, Take 1 tablet (80 mg total) by mouth daily at 6 PM., Disp: 90 tablet, Rfl: 0 .  buPROPion (WELLBUTRIN XL) 300 MG 24 hr tablet, Take 300 mg by mouth daily. , Disp: , Rfl:  .  Calcium Carbonate-Vitamin D (CALTRATE 600+D PO), Take 1 tablet by mouth daily., Disp: , Rfl:  .  clopidogrel (PLAVIX) 75 MG tablet, TAKE 1 TABLET BY MOUTH EVERY DAY, Disp: 30 tablet, Rfl: 1 .  doxycycline (VIBRA-TABS) 100 MG tablet, Take 1 tablet (100 mg total) by mouth 2 (two) times daily., Disp: 14 tablet, Rfl: 0 .  DULoxetine (CYMBALTA) 60 MG capsule, Take 60 mg by  mouth 2 (two) times daily. , Disp: , Rfl:  .  famotidine (PEPCID) 20 MG tablet, TAKE 1 TABLET BY MOUTH EVERYDAY AT BEDTIME *NOT COVERED*, Disp: , Rfl: 12 .  ferrous sulfate 325 (65 FE) MG tablet, Take 1 tablet (325 mg total) by mouth daily., Disp: 30 tablet, Rfl: 0 .  fluticasone (FLONASE) 50 MCG/ACT nasal spray, Place 2 sprays into both nostrils daily., Disp: 16 g, Rfl: 3 .  [START ON 03/22/2019] oxyCODONE-acetaminophen (PERCOCET) 5-325 MG tablet, Take 1-2 tablets by mouth daily as needed for up to 30 days for severe pain., Disp: 60 tablet, Rfl: 0 .  triamcinolone cream (KENALOG) 0.1 %, Apply 1 application topically as needed., Disp: , Rfl: 0 .  vitamin C (ASCORBIC ACID) 500 MG tablet, Take 500 mg by mouth daily., Disp: , Rfl:  .  Cyanocobalamin (VITAMIN B-12 PO), Take 1 tablet by mouth daily., Disp: , Rfl:  .  [START ON 02/20/2019] oxyCODONE-acetaminophen (PERCOCET) 5-325 MG tablet, Take 1-2 tablets by mouth daily as needed for up to 30 days for severe pain., Disp: 60 tablet, Rfl: 0 .  [START ON 01/21/2019]  oxyCODONE-acetaminophen (PERCOCET) 5-325 MG tablet, Take 1-2 tablets by mouth daily as needed for up to 30 days for severe pain., Disp: 60 tablet, Rfl: 0 .  [START ON 01/21/2019] pregabalin (LYRICA) 50 MG capsule, Take 1 capsule (50 mg total) by mouth 2 (two) times daily., Disp: 60 capsule, Rfl: 2  ROS  Constitutional: Denies any fever or chills Gastrointestinal: No reported hemesis, hematochezia, vomiting, or acute GI distress Musculoskeletal: Denies any acute onset joint swelling, redness, loss of ROM, or weakness Neurological: No reported episodes of acute onset apraxia, aphasia, dysarthria, agnosia, amnesia, paralysis, loss of coordination, or loss of consciousness  Allergies  Ms. Decelle is allergic to [employer] sodium]; aspirin; celebrex [celecoxib]; effexor [venlafaxine]; gabapentin; hydrocodone-homatropine; ibuprofen; tape; vioxx [rofecoxib]; other; and tramadol.  Reserve  Drug: Ms. Lemus  reports no history of drug use. Alcohol:  reports current alcohol use. Tobacco:  reports that she quit smoking about 22 years ago. Her smoking use included cigarettes. She has a 40.00 pack-year smoking history. She has never used smokeless tobacco. Medical:  has a past medical history of Arthritis, Atheromatous plaque (10/18/2017), Back pain, Cancer ([place]), Depression, Diverticulosis, Dizziness, GERD (gastroesophageal reflux disease), Heart murmur, Hyperlipidemia (10/18/2017), PONV (postoperative nausea and vomiting), and Stroke ([place]). Surgical: Ms. Sobh  has a past surgical history that includes Tonsillectomy; Facelift (89); Cervical disc surgery (8); Back surgery (12); Colonoscopy with propofol (N/A, 01/13/2016); Total hip arthroplasty (Right, 04/22/2016); TEE without cardioversion (N/A, 08/12/2017); LOOP RECORDER INSERTION (N/A, 08/23/2017); Breast excisional biopsy (Left, 90's); Breast biopsy (Bilateral); and Cataract extraction w/PHACO (Left, 12/20/2018). Family: family history includes Aneurysm  in her brother; Breast cancer (age of onset: 13) in her maternal aunt; Cushing syndrome in her paternal grandmother; Dementia in her paternal grandfather; Diabetes in her maternal grandmother; Kidney disease in her maternal grandfather; Lung cancer in her father; Stroke in her brother.  Constitutional Exam  General appearance: Well nourished, well developed, and well hydrated. In no apparent acute distress Vitals:   01/09/19 1407  BP: (!) 143/78  Pulse: 81  Resp: 16  Temp: 97.8 F (36.6 C)  SpO2: 95%  Weight: 182 lb (82.6 kg)  Height: 5' 4.5" (1.638 m)  Psych/Mental status: Alert, oriented x 3 (person, place, & time)       Eyes: PERLA Respiratory: No evidence of acute respiratory distress  Lumbar Spine Area Exam  Skin &  Axial Inspection: No masses, redness, or swelling Alignment: Symmetrical Functional ROM: Unrestricted ROM       Stability: No instability detected Muscle Tone/Strength: Functionally intact. No obvious neuro-muscular anomalies detected. Sensory (Neurological): Unimpaired Palpation: Tender       Provocative Tests: Hyperextension/rotation test: deferred today       Lumbar quadrant test (Kemp's test): deferred today       Lateral bending test: deferred today       Patrick's Maneuver: deferred today                     Gait & Posture Assessment  Ambulation: Patient ambulates using a cane Gait: Relatively normal for age and body habitus Posture: WNL   Lower Extremity Exam    Side: Right lower extremity  Side: Left lower extremity  Stability: No instability observed          Stability: No instability observed          Skin & Extremity Inspection: Skin color, temperature, and hair growth are WNL. No peripheral edema or cyanosis. No masses, redness, swelling, asymmetry, or associated skin lesions. No contractures.  Skin & Extremity Inspection: Skin color, temperature, and hair growth are WNL. No peripheral edema or cyanosis. No masses, redness, swelling, asymmetry, or  associated skin lesions. No contractures.  Functional ROM: Unrestricted ROM                  Functional ROM: Decreased ROM                  Muscle Tone/Strength: Functionally intact. No obvious neuro-muscular anomalies detected.  Muscle Tone/Strength: Functionally intact. No obvious neuro-muscular anomalies detected.  Sensory (Neurological): Unimpaired        Sensory (Neurological): Referred pain pattern            Palpation: No palpable anomalies  Palpation: Tender   Assessment  Primary Diagnosis & Pertinent Problem List: The primary encounter diagnosis was Spondylolisthesis of lumbar region. Diagnoses of Primary osteoarthritis of left hip, Chronic pain syndrome, and Long term current use of opiate analgesic were also pertinent to this visit.  Status Diagnosis  Persistent Persistent Controlled 1. Spondylolisthesis of lumbar region   2. Primary osteoarthritis of left hip   3. Chronic pain syndrome   4. Long term current use of opiate analgesic     Problems updated and reviewed during this visit: No problems updated. Plan of Care  Pharmacotherapy (Medications Ordered): Meds ordered this encounter  Medications  . oxyCODONE-acetaminophen (PERCOCET) 5-325 MG tablet    Sig: Take 1-2 tablets by mouth daily as needed for up to 30 days for severe pain.    Dispense:  60 tablet    Refill:  0    Do not place this medication, or any other prescription from our practice, on "Automatic Refill". Patient may have prescription filled one day early if pharmacy is closed on scheduled refill date.    Order Specific Question:   Supervising Provider    Answer:   Gillis Santa [[id number]]  . pregabalin (LYRICA) 50 MG capsule    Sig: Take 1 capsule (50 mg total) by mouth 2 (two) times daily.    Dispense:  60 capsule    Refill:  2    Do not place this medication, or any other prescription from our practice, on "Automatic Refill". Patient may have prescription filled one day early if pharmacy is closed on  scheduled refill date.    Order Specific Question:  Supervising Provider    Answer:   Gillis Santa [[id number]]  .  oxyCODONE (OXY IR/ROXICODONE) 5 MG immediate release tablet    Sig: Take 0.5 tablets (2.5 mg total) by mouth 2 (two) times daily as needed for up to 30 days for severe pain.    Dispense: 60 tablet    Refill:  0    Do not place this medication, or any other prescription from our practice, on "Automatic Refill". Patient may have prescription filled one day early if pharmacy is closed on scheduled refill date.    Order Specific Question:   Supervising Provider    Answer:   Gillis Santa [[id number]]  .  oxyCODONE-acetaminophen (PERCOCET) 5-325 MG tablet    Sig: Take 1-2 tablets by mouth daily as needed for up to 30 days for severe pain.    Dispense:  60 tablet    Refill:  0    Do not place this medication, or any other prescription from our practice, on "Automatic Refill". Patient may have prescription filled one day early if pharmacy is closed on scheduled refill date.    Order Specific Question:   Supervising Provider    Answer:   Gillis Santa [[id number]]   New Prescriptions   No medications on file   Medications administered today: [name] had no medications administered during this visit. Lab-work, procedure(s), and/or referral(s): Orders Placed This Encounter  Procedures  . ToxASSURE Select 13 (MW), Urine   Imaging and/or referral(s): None  Interventional therapies: Planned, scheduled, and/or pending:   Not at this time.  Provider-requested follow-up: Return in about 3 months (around 04/09/2019) for MedMgmt.  Future Appointments  Date Time Provider [place]  01/18/2019  7:05 AM [facility] DEVICE REMOTES [facility]  02/01/2019 11:15 AM Deboraha Sprang, MD [facility]  02/16/2019  2:00 PM O'Brien-Blaney, Denisa L, LPN [facility]  02/11/299  1:30 PM Burnard Hawthorne, FNP [facility]  04/10/2019  1:45 PM Vevelyn Francois, NP  [facility] None   Primary Care Physician: Burnard Hawthorne, FNP Location: [facility] Outpatient Pain Management Facility Note by: Vevelyn Francois NP Date: 01/09/2019; Time: 1:04 PM  Pain Score Disclaimer: We use the NRS-11 scale. This is a self-reported, subjective measurement of pain severity with only modest accuracy. It is used primarily to identify changes within a particular patient. It must be understood that outpatient pain scales are significantly less accurate that those used for research, where they can be applied under ideal controlled circumstances with minimal exposure to variables. In reality, the score is likely to be a combination of pain intensity and pain affect, where pain affect describes the degree of emotional arousal or changes in action readiness caused by the sensory experience of pain. Factors such as social and work situation, setting, emotional state, anxiety levels, expectation, and prior pain experience may influence pain perception and show large inter-individual differences that may also be affected by time variables.  Patient instructions provided during this appointment: Patient Instructions  ____________________________________________________________________________________________  Medication Rules  Purpose: To inform patients, and their family members, of our rules and regulations.  Applies to: All patients receiving prescriptions (written or electronic).  Pharmacy of record: Pharmacy where electronic prescriptions will be sent. If written prescriptions are taken to a different pharmacy, please inform the nursing staff. The pharmacy listed in the electronic medical record should be the one where you would like electronic prescriptions to be sent.  Electronic prescriptions: In compliance with the [place]  Prevention (STOP) Act of 2017 (Session Law 2017-74/H243), effective December 06, 2018, all controlled substances must be  electronically prescribed. Calling prescriptions to the pharmacy will cease to exist.  Prescription refills: Only during scheduled appointments. Applies to all prescriptions.  NOTE: The following applies primarily to controlled substances (Opioid* Pain Medications).   Patient's responsibilities: 1. Pain Pills: Bring all pain pills to every appointment (except for procedure appointments). 2. Pill Bottles: Bring pills in original pharmacy bottle. Always bring the newest bottle. Bring bottle, even if empty. 3. Medication refills: You are responsible for knowing and keeping track of what medications you take and those you need refilled. The day before your appointment: write a list of all prescriptions that need to be refilled. The day of the appointment: give the list to the admitting nurse. Prescriptions will be written only during appointments. If you forget a medication: it will not be "Called in", "Faxed", or "electronically sent". You will need to get another appointment to get these prescribed. No early refills. Do not call asking to have your prescription filled early. 4. Prescription Accuracy: You are responsible for carefully inspecting your prescriptions before leaving our office. Have the discharge nurse carefully go over each prescription with you, before taking them home. Make sure that your name is accurately spelled, that your address is correct. Check the name and dose of your medication to make sure it is accurate. Check the number of pills, and the written instructions to make sure they are clear and accurate. Make sure that you are given enough medication to last until your next medication refill appointment. 5. Taking Medication: Take medication as prescribed. When it comes to controlled substances, taking less pills or less frequently than prescribed is permitted and encouraged. Never take more pills than instructed. Never take medication more frequently than prescribed.   6. Inform other Doctors: Always inform, all of your healthcare providers, of all the medications you take. 7. Pain Medication from other Providers: You are not allowed to accept any additional pain medication from any other Doctor or Healthcare provider. There are two exceptions to this rule. (see below) In the event that you require additional pain medication, you are responsible for notifying us, as stated below. 8. Medication Agreement: You are responsible for carefully reading and following our Medication Agreement. This must be signed before receiving any prescriptions from our practice. Safely store a copy of your signed Agreement. Violations to the Agreement will result in no further prescriptions. (Additional copies of our Medication Agreement are available upon request.) 9. Laws, Rules, & Regulations: All patients are expected to follow all Federal and [employer], [employer], Rules, [employer]. Ignorance of the Laws does not constitute a valid excuse. The use of any illegal substances is prohibited. 10. Adopted CDC guidelines & recommendations: Target dosing levels will be at or below 60 MME/day. Use of benzodiazepines** is not recommended.  Exceptions: There are only two exceptions to the rule of not receiving pain medications from other Healthcare Providers. 1. Exception #1 (Emergencies): In the event of an emergency (i.e.: accident requiring emergency care), you are allowed to receive additional pain medication. However, you are responsible for: As soon as you are able, call our office [phone], at any time of the day or night, and leave a message stating your name, the date and nature of the emergency, and the name and dose of the medication prescribed. In the event that your call is answered by a member of our staff, make sure to  document and save the date, time, and the name of the person that took your information.  2. Exception #2 (Planned Surgery): In the event that you are  scheduled by another doctor or dentist to have any type of surgery or procedure, you are allowed (for a period no longer than 30 days), to receive additional pain medication, for the acute post-op pain. However, in this case, you are responsible for picking up a copy of our "Post-op Pain Management for Surgeons" handout, and giving it to your surgeon or dentist. This document is available at our office, and does not require an appointment to obtain it. Simply go to our office during business hours (Monday-Thursday from 8:00 AM to 4:00 PM) (Friday 8:00 AM to 12:00 Noon) or if you have a scheduled appointment with [place], prior to your surgery, and ask for it by name. In addition, you will need to provide [place] with your name, name of your surgeon, type of surgery, and date of procedure or surgery.  *Opioid medications include: morphine, codeine, oxycodone, oxymorphone, hydrocodone, hydromorphone, meperidine, tramadol, tapentadol, buprenorphine, fentanyl, methadone. **Benzodiazepine medications include: diazepam (Valium), alprazolam (Xanax), clonazepam (Klonopine), lorazepam (Ativan), clorazepate (Tranxene), chlordiazepoxide (Librium), estazolam (Prosom), oxazepam (Serax), temazepam (Restoril), triazolam (Halcion) (Last updated: 02/02/2018) ____________________________________________________________________________________________

## 2019-01-09 NOTE — Patient Instructions (Signed)
____________________________________________________________________________________________  Medication Rules  Purpose: To inform patients, and their family members, of our rules and regulations.  Applies to: All patients receiving prescriptions (written or electronic).  Pharmacy of record: Pharmacy where electronic prescriptions will be sent. If written prescriptions are taken to a different pharmacy, please inform the nursing staff. The pharmacy listed in the electronic medical record should be the one where you would like electronic prescriptions to be sent.  Electronic prescriptions: In compliance with the [place] Strengthen Opioid Misuse Prevention (STOP) Act of 2017 (Session Law 2017-74/H243), effective December 06, 2018, all controlled substances must be electronically prescribed. Calling prescriptions to the pharmacy will cease to exist.  Prescription refills: Only during scheduled appointments. Applies to all prescriptions.  NOTE: The following applies primarily to controlled substances (Opioid* Pain Medications).   Patient's responsibilities: 1. Pain Pills: Bring all pain pills to every appointment (except for procedure appointments). 2. Pill Bottles: Bring pills in original pharmacy bottle. Always bring the newest bottle. Bring bottle, even if empty. 3. Medication refills: You are responsible for knowing and keeping track of what medications you take and those you need refilled. The day before your appointment: write a list of all prescriptions that need to be refilled. The day of the appointment: give the list to the admitting nurse. Prescriptions will be written only during appointments. If you forget a medication: it will not be "Called in", "Faxed", or "electronically sent". You will need to get another appointment to get these prescribed. No early refills. Do not call asking to have your prescription filled early. 4. Prescription Accuracy: You are responsible for  carefully inspecting your prescriptions before leaving our office. Have the discharge nurse carefully go over each prescription with you, before taking them home. Make sure that your name is accurately spelled, that your address is correct. Check the name and dose of your medication to make sure it is accurate. Check the number of pills, and the written instructions to make sure they are clear and accurate. Make sure that you are given enough medication to last until your next medication refill appointment. 5. Taking Medication: Take medication as prescribed. When it comes to controlled substances, taking less pills or less frequently than prescribed is permitted and encouraged. Never take more pills than instructed. Never take medication more frequently than prescribed.  6. Inform other Doctors: Always inform, all of your healthcare providers, of all the medications you take. 7. Pain Medication from other Providers: You are not allowed to accept any additional pain medication from any other Doctor or Healthcare provider. There are two exceptions to this rule. (see below) In the event that you require additional pain medication, you are responsible for notifying us, as stated below. 8. Medication Agreement: You are responsible for carefully reading and following our Medication Agreement. This must be signed before receiving any prescriptions from our practice. Safely store a copy of your signed Agreement. Violations to the Agreement will result in no further prescriptions. (Additional copies of our Medication Agreement are available upon request.) 9. Laws, Rules, & Regulations: All patients are expected to follow all Federal and State Laws, Statutes, Rules, & Regulations. Ignorance of the Laws does not constitute a valid excuse. The use of any illegal substances is prohibited. 10. Adopted CDC guidelines & recommendations: Target dosing levels will be at or below 60 MME/day. Use of benzodiazepines** is not  recommended.  Exceptions: There are only two exceptions to the rule of not receiving pain medications from other Healthcare Providers. 1.   Exception #1 (Emergencies): In the event of an emergency (i.e.: accident requiring emergency care), you are allowed to receive additional pain medication. However, you are responsible for: As soon as you are able, call our office [phone], at any time of the day or night, and leave a message stating your name, the date and nature of the emergency, and the name and dose of the medication prescribed. In the event that your call is answered by a member of our staff, make sure to document and save the date, time, and the name of the person that took your information.  2. Exception #2 (Planned Surgery): In the event that you are scheduled by another doctor or dentist to have any type of surgery or procedure, you are allowed (for a period no longer than 30 days), to receive additional pain medication, for the acute post-op pain. However, in this case, you are responsible for picking up a copy of our "Post-op Pain Management for Surgeons" handout, and giving it to your surgeon or dentist. This document is available at our office, and does not require an appointment to obtain it. Simply go to our office during business hours (Monday-Thursday from 8:00 AM to 4:00 PM) (Friday 8:00 AM to 12:00 Noon) or if you have a scheduled appointment with us, prior to your surgery, and ask for it by name. In addition, you will need to provide us with your name, name of your surgeon, type of surgery, and date of procedure or surgery.  *Opioid medications include: morphine, codeine, oxycodone, oxymorphone, hydrocodone, hydromorphone, meperidine, tramadol, tapentadol, buprenorphine, fentanyl, methadone. **Benzodiazepine medications include: diazepam (Valium), alprazolam (Xanax), clonazepam (Klonopine), lorazepam (Ativan), clorazepate (Tranxene), chlordiazepoxide (Librium), estazolam (Prosom),  oxazepam (Serax), temazepam (Restoril), triazolam (Halcion) (Last updated: 02/02/2018) ____________________________________________________________________________________________    

## 2019-01-09 NOTE — Progress Notes (Signed)
Nursing Pain Medication Assessment:  Safety precautions to be maintained throughout the outpatient stay will include: orient to surroundings, keep bed in low position, maintain call bell within reach at all times, provide assistance with transfer out of bed and ambulation.  Medication Inspection Compliance: Pill count conducted under aseptic conditions, in front of the patient. Neither the pills nor the bottle was removed from the patient's sight at any time. Once count was completed pills were immediately returned to the patient in their original bottle.  Medication: Oxycodone/APAP Pill/Patch Count: 12 of 60 pills remain Pill/Patch Appearance: Markings consistent with prescribed medication Bottle Appearance: Standard pharmacy container. Clearly labeled. Filled Date: 54 / 31 / 2019 Last Medication intake:  Today

## 2019-01-10 MED ORDER — OXYCODONE-ACETAMINOPHEN 5-325 MG PO TABS: 1.0000 | ORAL_TABLET | Freq: Every day | ORAL | 0 refills | Status: AC | PRN

## 2019-01-15 ENCOUNTER — Other Ambulatory Visit: Payer: Self-pay

## 2019-01-15 ENCOUNTER — Encounter: Payer: Self-pay | Admitting: *Deleted

## 2019-01-15 DIAGNOSIS — J302 Other seasonal allergic rhinitis: Secondary | ICD-10-CM | POA: Diagnosis not present

## 2019-01-15 DIAGNOSIS — R05 Cough: Secondary | ICD-10-CM | POA: Diagnosis not present

## 2019-01-15 DIAGNOSIS — F339 Major depressive disorder, recurrent, unspecified: Secondary | ICD-10-CM | POA: Diagnosis not present

## 2019-01-15 DIAGNOSIS — M48061 Spinal stenosis, lumbar region without neurogenic claudication: Secondary | ICD-10-CM | POA: Diagnosis not present

## 2019-01-15 DIAGNOSIS — J8410 Pulmonary fibrosis, unspecified: Secondary | ICD-10-CM | POA: Diagnosis not present

## 2019-01-15 DIAGNOSIS — R269 Unspecified abnormalities of gait and mobility: Secondary | ICD-10-CM | POA: Diagnosis not present

## 2019-01-16 NOTE — Progress Notes (Signed)
[name]      Assessment and Plan:  Lung nodule. - Small groundglass nodule in left upper lobe, initially seen September 2018. - Continue surveillance for 3 to 5 years total for ground glass lung nodule.  Repeat CT chest ordered after last visit. Follow up after CT chest.   Pulmonary fibrosis. -Right upper lobe honeycomb changing with traction bronchiectasis, which does not appear typical for UIP, however the patient does have bibasilar subpleural reticular changes which do appear suggestive of UIP. - Currently the patient does not appear to be symptomatic, will continue to monitor. --She continues to have some swelling of the distal interphalangeal joints on her fingers,will check a RF and ACE.   Acute bronchitis.  --Will give course of azithro and start on spiriva once daily.   Meds ordered this encounter  Medications  . tiotropium (SPIRIVA) 18 MCG inhalation capsule    Sig: Place 1 capsule (18 mcg total) into inhaler and inhale daily for 30 days.    Dispense:  30 capsule    Refill:  12  . azithromycin (ZITHROMAX) 250 MG tablet    Sig: Take 1 tablet (250 mg total) by mouth daily. Take 2 tabs at once on the first day, then once daily.    Dispense:  6 tablet    Refill:  0     No follow-ups on file.     Date: 01/16/2019  MRN# [id number] [name] [birth year]   SAFIYYA Ochoa is a 78 y.o. old female seen in consultation for chief complaint of:    Chief Complaint  Patient presents with  . Acute Visit    has been having SOB and chest congestion for a few months now  . Shortness of Breath    is having chest congestion, has had a few antibiotics  . Wheezing    a little bit  . Cough    has cough    HPI:   The patient is a 78 year old female with a history of chronic bronchitis, cough.  She had previously seen Dr. Raul Del.  She is followed here for history of a groundglass left upper lobe nodule.  She was asked to repeat the CT  chest in about 9 months. At that time it was noted that she had basilar scarring, and elevated right diaphragm.  She comes in today because she has throat congestion and she has cataract surgery next week. She is using albuterol inhaler a few times per day. She is on no other inhalers.  She has a mild cough of greenish sputum.    **CT chest 08/21/2018 images personally reviewed,  in comparison with previous on 08/19/2017; there is an unchanged left upper lobe honeycomb fibrosis peripherally when compared to previous, per radiology report this may be slightly larger than previous.  Some extends medially with traction bronchiectasis, mild subpleural interstitial changes bilaterally.  Elevated right diaphragm.    Medication:    Current Outpatient Medications:  .  acetaminophen (TYLENOL) 500 MG tablet, Take 500 mg by mouth every 6 (six) hours as needed., Disp: , Rfl:  .  albuterol (PROVENTIL HFA;VENTOLIN HFA) 108 (90 Base) MCG/ACT inhaler, TAKE 2 PUFFS BY MOUTH EVERY 6 HOURS AS NEEDED FOR WHEEZE OR SHORTNESS OF BREATH, Disp: 18 Inhaler, Rfl: 0 .  atorvastatin (LIPITOR) 80 MG tablet, Take 1 tablet (80 mg total) by mouth daily at 6 PM., Disp: 90 tablet, Rfl: 0 .  buPROPion (WELLBUTRIN XL) 300 MG 24 hr tablet, Take 300 mg  by mouth daily. , Disp: , Rfl:  .  Calcium Carbonate-Vitamin D (CALTRATE 600+D PO), Take 1 tablet by mouth daily., Disp: , Rfl:  .  clopidogrel (PLAVIX) 75 MG tablet, TAKE 1 TABLET BY MOUTH EVERY DAY, Disp: 30 tablet, Rfl: 1 .  Cyanocobalamin (VITAMIN B-12 PO), Take 1 tablet by mouth daily., Disp: , Rfl:  .  DULoxetine (CYMBALTA) 60 MG capsule, Take 60 mg by mouth 2 (two) times daily. , Disp: , Rfl:  .  famotidine (PEPCID) 20 MG tablet, TAKE 1 TABLET BY MOUTH EVERYDAY AT BEDTIME *NOT COVERED*, Disp: , Rfl: 12 .  ferrous sulfate 325 (65 FE) MG tablet, Take 1 tablet (325 mg total) by mouth daily., Disp: 30 tablet, Rfl: 0 .  fluticasone (FLONASE) 50 MCG/ACT nasal spray, Place 2 sprays  into both nostrils daily., Disp: 16 g, Rfl: 3 .  [START ON 03/22/2019] oxyCODONE-acetaminophen (PERCOCET) 5-325 MG tablet, Take 1-2 tablets by mouth daily as needed for up to 30 days for severe pain., Disp: 60 tablet, Rfl: 0 .  [START ON 02/20/2019] oxyCODONE-acetaminophen (PERCOCET) 5-325 MG tablet, Take 1-2 tablets by mouth daily as needed for up to 30 days for severe pain., Disp: 60 tablet, Rfl: 0 .  [START ON 01/21/2019] oxyCODONE-acetaminophen (PERCOCET) 5-325 MG tablet, Take 1-2 tablets by mouth daily as needed for up to 30 days for severe pain., Disp: 60 tablet, Rfl: 0 .  [START ON 01/21/2019] pregabalin (LYRICA) 50 MG capsule, Take 1 capsule (50 mg total) by mouth 2 (two) times daily., Disp: 60 capsule, Rfl: 2 .  triamcinolone cream (KENALOG) 0.1 %, Apply 1 application topically as needed., Disp: , Rfl: 0 .  vitamin C (ASCORBIC ACID) 500 MG tablet, Take 500 mg by mouth daily., Disp: , Rfl:    Allergies:  Aleve [naproxen sodium]; Aspirin; Celebrex [celecoxib]; Effexor [venlafaxine]; Gabapentin; Hydrocodone-homatropine; Ibuprofen; Tape; Vioxx [rofecoxib]; Other; and Tramadol      LABORATORY PANEL:   CBC No results for input(s): WBC, HGB, HCT, PLT in the last 168 hours. ------------------------------------------------------------------------------------------------------------------  Chemistries  No results for input(s): NA, K, CL, CO2, GLUCOSE, BUN, CREATININE, CALCIUM, MG, AST, ALT, ALKPHOS, BILITOT in the last 168 hours.  Invalid input(s): GFRCGP ------------------------------------------------------------------------------------------------------------------  Cardiac Enzymes No results for input(s): TROPONINI in the last 168 hours. ------------------------------------------------------------  RADIOLOGY:  No results found.     Thank  you for the consultation and for allowing [facility] to assist in the care of your patient. Our recommendations are  noted above.  Please contact us if we can be of further service.  Marda Stalker, M.D., F.C.C.P.  Board Certified in Internal Medicine, Pulmonary Medicine, [place], and Sleep Medicine.  [facility] Office Number: [phone]  01/16/2019

## 2019-01-17 ENCOUNTER — Ambulatory Visit (INDEPENDENT_AMBULATORY_CARE_PROVIDER_SITE_OTHER): Payer: Medicare Other | Admitting: Internal Medicine

## 2019-01-17 ENCOUNTER — Encounter: Payer: Self-pay | Admitting: Internal Medicine

## 2019-01-17 VITALS — BP 144/82 | HR 83 | Ht 64.5 in | Wt 199.6 lb

## 2019-01-17 DIAGNOSIS — R911 Solitary pulmonary nodule: Secondary | ICD-10-CM

## 2019-01-17 DIAGNOSIS — I639 Cerebral infarction, unspecified: Secondary | ICD-10-CM | POA: Diagnosis not present

## 2019-01-17 DIAGNOSIS — R918 Other nonspecific abnormal finding of lung field: Secondary | ICD-10-CM

## 2019-01-17 DIAGNOSIS — J8410 Pulmonary fibrosis, unspecified: Secondary | ICD-10-CM

## 2019-01-17 MED ORDER — TIOTROPIUM BROMIDE MONOHYDRATE 18 MCG IN CAPS: 18.0000 ug | ORAL_CAPSULE | Freq: Every day | RESPIRATORY_TRACT | 12 refills | Status: DC

## 2019-01-17 MED ORDER — AZITHROMYCIN 250 MG PO TABS: 250.0000 mg | ORAL_TABLET | Freq: Every day | ORAL | 0 refills | Status: DC

## 2019-01-17 NOTE — Patient Instructions (Addendum)
Will start spiriva inhaler once daily. Use the albuterol inhaler only if you have trouble breathing.  Will give course of azithromycin.   Go ahead and have the CT scan scheduled in September.

## 2019-01-18 ENCOUNTER — Ambulatory Visit (INDEPENDENT_AMBULATORY_CARE_PROVIDER_SITE_OTHER): Payer: Medicare Other

## 2019-01-18 DIAGNOSIS — M4800 Spinal stenosis, site unspecified: Secondary | ICD-10-CM | POA: Diagnosis not present

## 2019-01-18 DIAGNOSIS — M545 Low back pain: Secondary | ICD-10-CM | POA: Diagnosis not present

## 2019-01-18 DIAGNOSIS — Z981 Arthrodesis status: Secondary | ICD-10-CM | POA: Diagnosis not present

## 2019-01-18 DIAGNOSIS — I639 Cerebral infarction, unspecified: Secondary | ICD-10-CM | POA: Diagnosis not present

## 2019-01-18 NOTE — Discharge Instructions (Signed)

## 2019-01-19 LAB — CUP PACEART REMOTE DEVICE CHECK
Date Time Interrogation Session: 20200213224025
Implantable Pulse Generator Implant Date: 20180918

## 2019-01-22 ENCOUNTER — Other Ambulatory Visit: Payer: Self-pay | Admitting: Internal Medicine

## 2019-01-22 ENCOUNTER — Other Ambulatory Visit: Payer: Self-pay | Admitting: *Deleted

## 2019-01-22 NOTE — Telephone Encounter (Signed)
Received fax from [facility] requesting refill for zpak, due to pt losing Rx.  Rx has been refused per Dr. Ashby Dawes.  Pt was prescribed zpak on 01/17/19 and should have completed the course by now.  Nothing further is needed.

## 2019-01-23 ENCOUNTER — Other Ambulatory Visit: Payer: Self-pay | Admitting: Family

## 2019-01-23 DIAGNOSIS — R05 Cough: Secondary | ICD-10-CM

## 2019-01-23 DIAGNOSIS — R058 Other specified cough: Secondary | ICD-10-CM

## 2019-01-23 DIAGNOSIS — J8410 Pulmonary fibrosis, unspecified: Secondary | ICD-10-CM

## 2019-01-23 NOTE — Telephone Encounter (Signed)
Please review for refill.  

## 2019-01-24 ENCOUNTER — Ambulatory Visit: Payer: Medicare Other | Admitting: Anesthesiology

## 2019-01-24 ENCOUNTER — Encounter
Admission: RE | Disposition: A | Discharge status: Home / Self Care | Payer: Self-pay | Source: Home / Self Care | Attending: Ophthalmology

## 2019-01-24 ENCOUNTER — Ambulatory Visit
Admission: RE | Admit: 2019-01-24 | Discharge: 2019-01-24 | Disposition: A | Discharge status: Home / Self Care | Payer: Medicare Other | Attending: Ophthalmology | Admitting: Ophthalmology

## 2019-01-24 ENCOUNTER — Telehealth: Payer: Self-pay | Admitting: *Deleted

## 2019-01-24 DIAGNOSIS — G8929 Other chronic pain: Secondary | ICD-10-CM | POA: Insufficient documentation

## 2019-01-24 DIAGNOSIS — Z7902 Long term (current) use of antithrombotics/antiplatelets: Secondary | ICD-10-CM | POA: Diagnosis not present

## 2019-01-24 DIAGNOSIS — E7800 Pure hypercholesterolemia, unspecified: Secondary | ICD-10-CM | POA: Insufficient documentation

## 2019-01-24 DIAGNOSIS — Z87891 Personal history of nicotine dependence: Secondary | ICD-10-CM | POA: Diagnosis not present

## 2019-01-24 DIAGNOSIS — M549 Dorsalgia, unspecified: Secondary | ICD-10-CM | POA: Diagnosis not present

## 2019-01-24 DIAGNOSIS — Z79899 Other long term (current) drug therapy: Secondary | ICD-10-CM | POA: Diagnosis not present

## 2019-01-24 DIAGNOSIS — Z8673 Personal history of transient ischemic attack (TIA), and cerebral infarction without residual deficits: Secondary | ICD-10-CM | POA: Insufficient documentation

## 2019-01-24 DIAGNOSIS — F329 Major depressive disorder, single episode, unspecified: Secondary | ICD-10-CM | POA: Diagnosis not present

## 2019-01-24 DIAGNOSIS — Z886 Allergy status to analgesic agent status: Secondary | ICD-10-CM | POA: Diagnosis not present

## 2019-01-24 DIAGNOSIS — F419 Anxiety disorder, unspecified: Secondary | ICD-10-CM | POA: Diagnosis not present

## 2019-01-24 DIAGNOSIS — Z79891 Long term (current) use of opiate analgesic: Secondary | ICD-10-CM | POA: Diagnosis not present

## 2019-01-24 DIAGNOSIS — H2511 Age-related nuclear cataract, right eye: Secondary | ICD-10-CM | POA: Diagnosis not present

## 2019-01-24 DIAGNOSIS — H25811 Combined forms of age-related cataract, right eye: Secondary | ICD-10-CM | POA: Diagnosis not present

## 2019-01-24 HISTORY — PX: CATARACT EXTRACTION W/PHACO: SHX586

## 2019-01-24 SURGERY — PHACOEMULSIFICATION, CATARACT, WITH IOL INSERTION
Anesthesia: Monitor Anesthesia Care | Site: Eye | Laterality: Right

## 2019-01-24 MED ORDER — ACETAMINOPHEN 160 MG/5ML PO SOLN: 325.0000 mg | ORAL | Status: DC | PRN

## 2019-01-24 MED ORDER — LIDOCAINE HCL (PF) 2 % IJ SOLN
INTRAOCULAR | Status: DC | PRN
  Administered 2019-01-24: 1 mL

## 2019-01-24 MED ORDER — TETRACAINE HCL 0.5 % OP SOLN
1.0000 [drp] | OPHTHALMIC | Status: DC | PRN
  Administered 2019-01-24 (×2): 1 [drp] via OPHTHALMIC

## 2019-01-24 MED ORDER — MIDAZOLAM HCL 2 MG/2ML IJ SOLN
INTRAMUSCULAR | Status: DC | PRN
  Administered 2019-01-24: 2 mg via INTRAVENOUS

## 2019-01-24 MED ORDER — BRIMONIDINE TARTRATE-TIMOLOL 0.2-0.5 % OP SOLN
OPHTHALMIC | Status: DC | PRN
  Administered 2019-01-24: 1 [drp] via OPHTHALMIC

## 2019-01-24 MED ORDER — CEFUROXIME OPHTHALMIC INJECTION 1 MG/0.1 ML
INJECTION | OPHTHALMIC | Status: DC | PRN
  Administered 2019-01-24: 0.1 mL via INTRACAMERAL

## 2019-01-24 MED ORDER — NA HYALUR & NA CHOND-NA HYALUR 0.4-0.35 ML IO KIT
PACK | INTRAOCULAR | Status: DC | PRN
  Administered 2019-01-24: 1 mL via INTRAOCULAR

## 2019-01-24 MED ORDER — MOXIFLOXACIN HCL 0.5 % OP SOLN
1.0000 [drp] | OPHTHALMIC | Status: DC | PRN
  Administered 2019-01-24 (×3): 1 [drp] via OPHTHALMIC

## 2019-01-24 MED ORDER — ARMC OPHTHALMIC DILATING DROPS
1.0000 "application " | OPHTHALMIC | Status: DC | PRN
  Administered 2019-01-24 (×3): 1 via OPHTHALMIC

## 2019-01-24 MED ORDER — EPINEPHRINE PF 1 MG/ML IJ SOLN
INTRAOCULAR | Status: DC | PRN
  Administered 2019-01-24: 66 mL via OPHTHALMIC

## 2019-01-24 MED ORDER — ACETAMINOPHEN 325 MG PO TABS: 325.0000 mg | ORAL_TABLET | ORAL | Status: DC | PRN

## 2019-01-24 MED ORDER — LACTATED RINGERS IV SOLN: INTRAVENOUS | Status: DC

## 2019-01-24 MED ORDER — FENTANYL CITRATE (PF) 100 MCG/2ML IJ SOLN
INTRAMUSCULAR | Status: DC | PRN
  Administered 2019-01-24: 50 ug via INTRAVENOUS

## 2019-01-24 SURGICAL SUPPLY — 20 items
CANNULA ANT/CHMB 27G (MISCELLANEOUS) ×1 IMPLANT
CANNULA ANT/CHMB 27GA (MISCELLANEOUS) ×2 IMPLANT
GLOVE SURG LX 7.5 STRW (GLOVE) ×1
GLOVE SURG LX STRL 7.5 STRW (GLOVE) ×1 IMPLANT
GLOVE SURG TRIUMPH 8.0 PF LTX (GLOVE) ×2 IMPLANT
GOWN STRL REUS W/ TWL LRG LVL3 (GOWN DISPOSABLE) ×2 IMPLANT
GOWN STRL REUS W/TWL LRG LVL3 (GOWN DISPOSABLE) ×2
LENS IOL TECNIS ITEC 21.0 (Intraocular Lens) ×1 IMPLANT
MARKER SKIN DUAL TIP RULER LAB (MISCELLANEOUS) ×2 IMPLANT
NDL FILTER BLUNT 18X1 1/2 (NEEDLE) ×1 IMPLANT
NEEDLE FILTER BLUNT 18X 1/2SAF (NEEDLE) ×1
NEEDLE FILTER BLUNT 18X1 1/2 (NEEDLE) ×1 IMPLANT
PACK CATARACT BRASINGTON (MISCELLANEOUS) ×2 IMPLANT
PACK EYE AFTER SURG (MISCELLANEOUS) ×2 IMPLANT
PACK OPTHALMIC (MISCELLANEOUS) ×2 IMPLANT
SYR 3ML LL SCALE MARK (SYRINGE) ×2 IMPLANT
SYR 5ML LL (SYRINGE) ×2 IMPLANT
SYR TB 1ML LUER SLIP (SYRINGE) ×2 IMPLANT
WATER STERILE IRR 500ML POUR (IV SOLUTION) ×2 IMPLANT
WIPE NON LINTING 3.25X3.25 (MISCELLANEOUS) ×2 IMPLANT

## 2019-01-24 NOTE — Telephone Encounter (Signed)
Paper refill request came via fax: Pt states she misplaced her zpak. She is requesting new rx.

## 2019-01-24 NOTE — Anesthesia Procedure Notes (Signed)
Procedure Name: MAC Performed by: Serge Main, CRNA Pre-anesthesia Checklist: Patient identified, Emergency Drugs available, Suction available, Timeout performed and Patient being monitored Patient Re-evaluated:Patient Re-evaluated prior to induction Oxygen Delivery Method: Nasal cannula Placement Confirmation: positive ETCO2       

## 2019-01-24 NOTE — Anesthesia Postprocedure Evaluation (Signed)
Anesthesia Post Note  Patient: Melody Ochoa  Procedure(s) Performed: CATARACT EXTRACTION PHACO AND INTRAOCULAR LENS PLACEMENT ([place]) RIGHT (Right Eye)  Patient location during evaluation: PACU Anesthesia Type: MAC Level of consciousness: awake and alert Pain management: pain level controlled Vital Signs Assessment: post-procedure vital signs reviewed and stable Respiratory status: spontaneous breathing, nonlabored ventilation, respiratory function stable and patient connected to nasal cannula oxygen Cardiovascular status: stable and blood pressure returned to baseline Postop Assessment: no apparent nausea or vomiting Anesthetic complications: no    Trecia Rogers

## 2019-01-24 NOTE — Anesthesia Preprocedure Evaluation (Addendum)
Anesthesia Evaluation  Patient identified by MRN, date of birth, ID band Patient awake    Reviewed: Allergy & Precautions, H&P , NPO status , Patient's Chart, lab work & pertinent test results, reviewed documented beta blocker date and time   History of Anesthesia Complications (+) PONV and history of anesthetic complications  Airway Mallampati: II  TM Distance: >3 FB Neck ROM: full    Dental no notable dental hx.    Pulmonary former smoker,    Pulmonary exam normal breath sounds clear to auscultation       Cardiovascular Exercise Tolerance: Good negative cardio ROS Normal cardiovascular exam Rhythm:regular Rate:Normal     Neuro/Psych Anxiety Depression CVA    GI/Hepatic Neg liver ROS, GERD  ,  Endo/Other  negative endocrine ROS  Renal/GU negative Renal ROS  negative genitourinary   Musculoskeletal   Abdominal   Peds  Hematology negative hematology ROS (+)   Anesthesia Other Findings   Reproductive/Obstetrics negative OB ROS                            Anesthesia Physical Anesthesia Plan  ASA: II  Anesthesia Plan: MAC   Post-op Pain Management:    Induction:   PONV Risk Score and Plan:   Airway Management Planned:   Additional Equipment:   Intra-op Plan:   Post-operative Plan:   Informed Consent: I have reviewed the patients History and Physical, chart, labs and discussed the procedure including the risks, benefits and alternatives for the proposed anesthesia with the patient or authorized representative who has indicated his/her understanding and acceptance.     Dental Advisory Given  Plan Discussed with: CRNA and Anesthesiologist  Anesthesia Plan Comments:         Anesthesia Quick Evaluation

## 2019-01-24 NOTE — Op Note (Signed)
LOCATION:  [place]   PREOPERATIVE DIAGNOSIS:    Nuclear sclerotic cataract right eye. H25.11   POSTOPERATIVE DIAGNOSIS:  Nuclear sclerotic cataract right eye.     PROCEDURE:  Phacoemusification with posterior chamber intraocular lens placement of the right eye   LENS:   Implant Name Type Inv. Item Serial No. Manufacturer Lot No. LRB No. Used  LENS IOL DIOP 21.0 - [id number] Intraocular Lens LENS IOL DIOP 21.0 [id number] AMO 21.0 Right 1        ULTRASOUND TIME: 11 % of 1 minutes, 12 seconds.  CDE 8.0   SURGEON:  Wyonia Hough, MD   ANESTHESIA:  Topical with tetracaine drops and 2% Xylocaine jelly, augmented with 1% preservative-free intracameral lidocaine.    COMPLICATIONS:  None.   DESCRIPTION OF PROCEDURE:  The patient was identified in the holding room and transported to the operating room and placed in the supine position under the operating microscope.  The right eye was identified as the operative eye and it was prepped and draped in the usual sterile ophthalmic fashion.   A 1 millimeter clear-corneal paracentesis was made at the 12:00 position.  0.5 ml of preservative-free 1% lidocaine was injected into the anterior chamber. The anterior chamber was filled with Viscoat viscoelastic.  A 2.4 millimeter keratome was used to make a near-clear corneal incision at the 9:00 position.  A curvilinear capsulorrhexis was made with a cystotome and capsulorrhexis forceps.  Balanced salt solution was used to hydrodissect and hydrodelineate the nucleus.   Phacoemulsification was then used in stop and chop fashion to remove the lens nucleus and epinucleus.  The remaining cortex was then removed using the irrigation and aspiration handpiece. Provisc was then placed into the capsular bag to distend it for lens placement.  A lens was then injected into the capsular bag.  The remaining viscoelastic was aspirated.   Wounds were hydrated with balanced salt solution.  The anterior  chamber was inflated to a physiologic pressure with balanced salt solution.  No wound leaks were noted. Cefuroxime 0.1 ml of a 10mg /ml solution was injected into the anterior chamber for a dose of 1 mg of intracameral antibiotic at the completion of the case.   Timolol and Brimonidine drops were applied to the eye.  The patient was taken to the recovery room in stable condition without complications of anesthesia or surgery.   Melody Ochoa 01/24/2019, 10:46 AM

## 2019-01-24 NOTE — Transfer of Care (Signed)
Immediate Anesthesia Transfer of Care Note  Patient: Melody Ochoa  Procedure(s) Performed: CATARACT EXTRACTION PHACO AND INTRAOCULAR LENS PLACEMENT ([facility]) RIGHT (Right Eye)  Patient Location: PACU  Anesthesia Type: MAC  Level of Consciousness: awake, alert  and patient cooperative  Airway and Oxygen Therapy: Patient Spontanous Breathing and Patient connected to supplemental oxygen  Post-op Assessment: Post-op Vital signs reviewed, Patient's Cardiovascular Status Stable, Respiratory Function Stable, Patent Airway and No signs of Nausea or vomiting  Post-op Vital Signs: Reviewed and stable  Complications: No apparent anesthesia complications

## 2019-01-24 NOTE — H&P (Signed)

## 2019-01-25 NOTE — Telephone Encounter (Signed)
Pt called 3 times in reference to rx refill.

## 2019-01-26 ENCOUNTER — Other Ambulatory Visit: Payer: Self-pay | Admitting: Internal Medicine

## 2019-01-30 DIAGNOSIS — D485 Neoplasm of uncertain behavior of skin: Secondary | ICD-10-CM | POA: Diagnosis not present

## 2019-01-30 DIAGNOSIS — B078 Other viral warts: Secondary | ICD-10-CM | POA: Diagnosis not present

## 2019-01-30 DIAGNOSIS — L821 Other seborrheic keratosis: Secondary | ICD-10-CM | POA: Diagnosis not present

## 2019-01-30 DIAGNOSIS — L578 Other skin changes due to chronic exposure to nonionizing radiation: Secondary | ICD-10-CM | POA: Diagnosis not present

## 2019-01-30 DIAGNOSIS — L820 Inflamed seborrheic keratosis: Secondary | ICD-10-CM | POA: Diagnosis not present

## 2019-01-30 NOTE — Progress Notes (Signed)
Carelink Summary Report / Loop Recorder 

## 2019-02-01 ENCOUNTER — Ambulatory Visit (INDEPENDENT_AMBULATORY_CARE_PROVIDER_SITE_OTHER): Payer: Medicare Other | Admitting: Internal Medicine

## 2019-02-01 ENCOUNTER — Encounter: Payer: Self-pay | Admitting: Internal Medicine

## 2019-02-01 VITALS — BP 140/80 | HR 79 | Ht 64.5 in | Wt 198.5 lb

## 2019-02-01 DIAGNOSIS — I639 Cerebral infarction, unspecified: Secondary | ICD-10-CM | POA: Diagnosis not present

## 2019-02-01 DIAGNOSIS — R0609 Other forms of dyspnea: Secondary | ICD-10-CM | POA: Diagnosis not present

## 2019-02-01 DIAGNOSIS — Z959 Presence of cardiac and vascular implant and graft, unspecified: Secondary | ICD-10-CM

## 2019-02-01 DIAGNOSIS — R0600 Dyspnea, unspecified: Secondary | ICD-10-CM

## 2019-02-01 NOTE — Patient Instructions (Signed)
Medication Instructions:  - Your physician recommends that you continue on your current medications as directed. Please refer to the Current Medication list given to you today.  If you need a refill on your cardiac medications before your next appointment, please call your pharmacy.   Lab work: - Your physician recommends that you have lab work today: BNP  If you have labs (blood work) drawn today and your tests are completely normal, you will receive your results only by: Marland Kitchen MyChart Message (if you have MyChart) OR . A paper copy in the mail If you have any lab test that is abnormal or we need to change your treatment, we will call you to review the results.  Testing/Procedures: - none   Follow-Up: At [facility], you and your health needs are our priority.  As part of our continuing mission to provide you with exceptional heart care, we have created designated Provider Care Teams.  These Care Teams include your primary Cardiologist (physician) and Advanced Practice Providers (APPs -  Physician Assistants and Nurse Practitioners) who all work together to provide you with the care you need, when you need it. . You will need a follow up appointment in 1 year with Dr. Caryl Comes.  Please call our office 2 months in advance to schedule this appointment.    Any Other Special Instructions Will Be Listed Below (If Applicable). - N/A

## 2019-02-01 NOTE — Progress Notes (Signed)
ELECTROPHYSIOLOGY OFFICE NOTE  Patient ID: Melody Ochoa, MRN: [id number], DOB/AGE: May 29, 1941 78 y.o. Admit date: (Not on file) Date of Consult: 02/01/2019  Primary Physician: Burnard Hawthorne, FNP Primary Cardiologist: new   HPI Melody Ochoa is a 78 y.o. female  Seen following ILR LINQ implantation 9/*18 for Cryptogenic Stroke by [facility]   Echocardiogram demonstrated EF 50-55%, mild LVH, grade 1 diastolic dysfunction, LA 44.  TEE  Bulky aortic calcified plaque/ MR   Date LDL  9/18 126  5 /19  60    She is doing well overall. However, she will be undergoing surgery for her back in the near future. She is currently taking oxycodone but reports she does not want to become dependent on it.   She uses one pillow at night and only endorses SOB when walking, adding she feels this has worsened over the last 6 months. No edema  Recently diagnosed with COPD  She lives with her husband and they will be celebrating their 70 year anniversary this July 2020. She was married prior and had a daughter. Her and her husband also have a daughter together, who is permanently disabled.  She snores according to her husband. She has not had a sleep study, adding she sleeps well at night and feels rested in the morning.        Past Medical History:  Diagnosis Date   Arthritis    Atheromatous plaque 10/18/2017   Back pain    Cancer ([place])    skin   Depression    Diverticulosis    Dizziness    patient had episode of dizziness when came in room. hx past no dx   GERD (gastroesophageal reflux disease)    Heart murmur    Hyperlipidemia 10/18/2017   PONV (postoperative nausea and vomiting)    yrs ago   Stroke [facility])       Surgical History:  Past Surgical History:  Procedure Laterality Date   BACK SURGERY  13   BREAST BIOPSY Bilateral    cores "years ago"   BREAST EXCISIONAL BIOPSY Left 90's   CATARACT EXTRACTION W/PHACO Left 12/20/2018   Procedure: CATARACT  EXTRACTION PHACO AND INTRAOCULAR LENS PLACEMENT ([place]) LEFT TOPICAL;  Surgeon: Leandrew Koyanagi, MD;  Location: [place];  Service: Ophthalmology;  Laterality: Left;   CATARACT EXTRACTION W/PHACO Right 01/24/2019   Procedure: CATARACT EXTRACTION PHACO AND INTRAOCULAR LENS PLACEMENT ([place]) RIGHT;  Surgeon: Leandrew Koyanagi, MD;  Location: [place];  Service: Ophthalmology;  Laterality: Right;   CERVICAL DISC SURGERY  09   COLONOSCOPY WITH PROPOFOL N/A 01/13/2016   Procedure: COLONOSCOPY WITH PROPOFOL;  Surgeon: [name], MD;  Location: [facility] ENDOSCOPY;  Service: Endoscopy;  Laterality: N/A;   FACELIFT  94   LOOP RECORDER INSERTION N/A 08/23/2017   Procedure: LOOP RECORDER INSERTION;  Surgeon: Thompson Grayer, MD;  Location: [place] CV LAB;  Service: Cardiovascular;  Laterality: N/A;   TEE WITHOUT CARDIOVERSION N/A 08/12/2017   Procedure: TRANSESOPHAGEAL ECHOCARDIOGRAM (TEE);  Surgeon: Sueanne Margarita, MD;  [facility] ENDOSCOPY;  Service: Cardiovascular;  Laterality: N/A;   TONSILLECTOMY     TOTAL HIP ARTHROPLASTY Right 04/22/2016   Procedure: TOTAL HIP ARTHROPLASTY;  Surgeon: Corky Mull, MD;  Location: [facility] ORS;  Service: Orthopedics;  Laterality: Right;     Current Meds  Medication Sig   acetaminophen (TYLENOL) 500 MG tablet Take 500 mg by mouth every 6 (six) hours as needed.   atorvastatin (LIPITOR) 80  MG tablet Take 1 tablet (80 mg total) by mouth daily at 6 PM.   azithromycin (ZITHROMAX) 250 MG tablet TAKE 1 TABLET (250 MG TOTAL) BY MOUTH DAILY. TAKE 2 TABS AT ONCE ON THE FIRST DAY, THEN ONCE DAILY.   buPROPion (WELLBUTRIN XL) 300 MG 24 hr tablet Take 300 mg by mouth daily.    Calcium Carbonate-Vitamin D (CALTRATE 600+D PO) Take 1 tablet by mouth daily.   clopidogrel (PLAVIX) 75 MG tablet TAKE 1 TABLET BY MOUTH EVERY DAY   DULoxetine (CYMBALTA) 60 MG capsule Take 60 mg by mouth 2 (two) times daily.    famotidine (PEPCID) 20 MG tablet TAKE 1  TABLET BY MOUTH EVERYDAY AT BEDTIME *NOT COVERED*   ferrous sulfate 325 (65 FE) MG tablet Take 1 tablet (325 mg total) by mouth daily.   fluticasone (FLONASE) 50 MCG/ACT nasal spray Place into both nostrils as needed for allergies or rhinitis.   [START ON 03/22/2019] oxyCODONE-acetaminophen (PERCOCET) 5-325 MG tablet Take 1-2 tablets by mouth daily as needed for up to 30 days for severe pain.   [START ON 02/20/2019] oxyCODONE-acetaminophen (PERCOCET) 5-325 MG tablet Take 1-2 tablets by mouth daily as needed for up to 30 days for severe pain.   oxyCODONE-acetaminophen (PERCOCET) 5-325 MG tablet Take 1-2 tablets by mouth daily as needed for up to 30 days for severe pain.   pregabalin (LYRICA) 50 MG capsule Take 1 capsule (50 mg total) by mouth 2 (two) times daily.   tiotropium (SPIRIVA) 18 MCG inhalation capsule Place 1 capsule (18 mcg total) into inhaler and inhale daily for 30 days.   triamcinolone cream (KENALOG) 0.1 % Apply 1 application topically as needed.   VENTOLIN HFA 108 (90 Base) MCG/ACT inhaler TAKE 2 PUFFS BY MOUTH EVERY 6 HOURS AS NEEDED FOR WHEEZE OR SHORTNESS OF BREATH    Allergies:  Allergies  Allergen Reactions   Aleve [Naproxen Sodium] Other (See Comments)    Gi upset   Aspirin Other (See Comments)    Stomach pain-aggravates diverticulosis   Celebrex [Celecoxib] Other (See Comments)    Dizziness    Effexor [Venlafaxine] Other (See Comments)    Hot flashes    Gabapentin Other (See Comments)    "Night terrors"   Hydrocodone-Homatropine Diarrhea   Ibuprofen Other (See Comments)    Gi upset    Tape Itching and Other (See Comments)    Itchy blisters   Vioxx [Rofecoxib] Other (See Comments)    Gi upset   Other Rash and Other (See Comments)    bandaides   Tramadol Other (See Comments)    hallucinations    Social History   Socioeconomic History   Marital status: Married    Spouse name: Not on file   Number of children: Not on file   Years  of education: Not on file   Highest education level: Not on file  Occupational History   Not on file  Social Needs   Financial resource strain: Not hard at all   Food insecurity:    Worry: Not on file    Inability: Not on file   Transportation needs:    Medical: No    Non-medical: No  Tobacco Use   Smoking status: Former Smoker    Packs/day: 1.00    Years: 40.00    Pack years: 40.00    Types: Cigarettes    Last attempt to quit: 11/13/1996    Years since quitting: 22.2   Smokeless tobacco: Never Used  Substance and Sexual Activity  Alcohol use: Yes    Comment: occ wine   Drug use: No   Sexual activity: Not on file  Lifestyle   Physical activity:    Days per week: Not on file    Minutes per session: Not on file   Stress: Not on file  Relationships   Social connections:    Talks on phone: Not on file    Gets together: Not on file    Attends religious service: Not on file    Active member of club or organization: Not on file    Attends meetings of clubs or organizations: Not on file    Relationship status: Not on file   Intimate partner violence:    Fear of current or ex partner: No    Emotionally abused: No    Physically abused: No    Forced sexual activity: No  Other Topics Concern   Not on file  Social History Narrative   Not on file     Family History  Problem Relation Age of Onset   Lung cancer Father    Aneurysm Brother    Stroke Brother    Diabetes Maternal Grandmother    Kidney disease Maternal Grandfather    Cushing syndrome Paternal Grandmother    Dementia Paternal Grandfather    Breast cancer Maternal Aunt 52     ROS:  Please see the history of present illness.     All other systems reviewed and negative.    Physical Exam:  Blood pressure 140/80, pulse 79, height 5' 4.5" (1.638 m), weight 198 lb 8 oz (90 kg). Well developed and nourished in no acute distress HENT normal Neck supple with JVP-8 +HJR  Clear Regular  rate and rhythm, no murmurs or gallops Abd-soft with active BS No Clubbing cyanosis edema Skin-warm and dry A & Oriented  Grossly normal sensory and motor function      Labs: Cardiac Enzymes No results for input(s): CKTOTAL, CKMB, TROPONINI in the last 72 hours. CBC Lab Results  Component Value Date   WBC 7.2 04/17/2018   HGB 11.6 (L) 04/17/2018   HCT 34.3 (L) 04/17/2018   MCV 87.6 04/17/2018   PLT 299 04/17/2018   PROTIME: No results for input(s): LABPROT, INR in the last 72 hours. Chemistry No results for input(s): NA, K, CL, CO2, BUN, CREATININE, CALCIUM, PROT, BILITOT, ALKPHOS, ALT, AST, GLUCOSE in the last 168 hours.  Invalid input(s): LABALBU Lipids Lab Results  Component Value Date   CHOL 143 04/07/2018   HDL 68.00 04/07/2018   LDLCALC 60 04/07/2018   TRIG 75.0 04/07/2018   BNP No results found for: PROBNP Th/yroid Function Tests: No results for input(s): TSH, T4TOTAL, T3FREE, THYROIDAB in the last 72 hours.  Invalid input(s): FREET3 Miscellaneous No results found for: DDIMER  Radiology/Studies:  No results found.  EKG: sinus @ 79 16/09/43 LVH by voltage   Assessment and Plan:  Cryptogenic Stroke   Aortic plaque  LINQ in place  Hyperlipidemia  Dyspnea on exertion  No intercurrent atrial fibrillation or flutter  On Anticoagulation;  No bleeding issues   Dyspnea may well be related to HFpEF as she has some jugular venous distention.  She is reluctant to undertake a diuretic trial; we will check a BNP to see if we can make a distinction between HFpEF and her COPD  She is taking atorvastatin 80  LDL at goal   We spent more than 50% of our >25 min visit in face to face counseling regarding the  above  I, Margit Banda am acting as a scribe for Virl Axe, M.D. I have reviewed the above documentation for accuracy and completeness, and I agree with the above.    Signed, Virl Axe, MD 02/01/19 [place],  [place]

## 2019-02-02 LAB — BRAIN NATRIURETIC PEPTIDE: BNP: 112.6 pg/mL — ABNORMAL HIGH (ref 0.0–100.0)

## 2019-02-06 ENCOUNTER — Telehealth: Payer: Self-pay | Admitting: Internal Medicine

## 2019-02-06 DIAGNOSIS — R5383 Other fatigue: Secondary | ICD-10-CM

## 2019-02-06 DIAGNOSIS — R61 Generalized hyperhidrosis: Secondary | ICD-10-CM

## 2019-02-06 NOTE — Telephone Encounter (Signed)
I attempted to call the patient with her BNP results. I left a message to call back at her home #. Attempted her cell #- "the mailbox is full."

## 2019-02-06 NOTE — Telephone Encounter (Signed)
Notes recorded by Deboraha Sprang, MD on 02/05/2019 at 9:02 PM EST Please Inform Patient that lab is nearly normal  Thanks

## 2019-02-06 NOTE — Telephone Encounter (Signed)
I spoke with the patient regarding her lab results. She states she was concerned today as she had an episode that came on suddenly where she began to perspire to the point her clothes were wet. She was talking with her cleaning lady this morning when this occurred.   She states the same thing happened a week ago, but has also happened "many times before." She has felt a little light headed today.  She states she was hoping Dr. Caryl Comes would give her a fluid pill for a few days due to the episode this morning and fatigue that she has been having.  I advised her that the sweating episode this morning is not usually associated with excess fluid retention, although fatigue could be a symptom.  I advised the patient that I do not see a recent TSH on file for her, so I wonder if it might not be a good idea to check this with the understanding if it abnormal, this would need to be followed by her PCP.  She is aware I will review further with Dr. Caryl Comes and call her back on Thursday with any further recommendations. She is leaving to go out of town on Friday.

## 2019-02-07 NOTE — Telephone Encounter (Signed)
I am not sure what would cause that spell   With her linq in place we can have her send a remote and we can make sure it is not a rhythm issue

## 2019-02-08 ENCOUNTER — Inpatient Hospital Stay: Admission: RE | Admit: 2019-02-08 | Payer: Medicare Other | Source: Ambulatory Visit

## 2019-02-08 NOTE — Telephone Encounter (Signed)
Manual transmission from 02/07/19 at 10:33am  shows no symptom, tachy, or AF episodes since implant. Pause and brady detection have been off since implant due to cryptogenic stroke implant indication. Presenting rhythm shows NSR at 95bpm.

## 2019-02-08 NOTE — Telephone Encounter (Signed)
I spoke with the patient. She is aware that Dr. Caryl Comes does not feel like this is a fluid issue for her.  He has given me a verbal ok to check her TSH. She is agreeable with this and states she transmitted for her LINQ yesterday. She states she also had another "spell" yesterday and checked her BP- she was 106/64.  The patient is leaving town tomorrow. I have advised her I will: - check to see what her transmission showed and let her know on her cell # - order her TSH (she will call back to schedule when she is back in town)  She is aware to check her BP if she has a "spell" while she is out of town.   Will forward to [facility] to see what transmission showed.

## 2019-02-09 ENCOUNTER — Other Ambulatory Visit: Payer: Self-pay | Admitting: Internal Medicine

## 2019-02-09 NOTE — Telephone Encounter (Signed)
I attempted to call the patient to notify her of the information I received from the [facility]. Her voice mail box is full- unable to leave a message. I will try her back at a later time.

## 2019-02-13 ENCOUNTER — Other Ambulatory Visit: Payer: Self-pay | Admitting: Student in an Organized Health Care Education/Training Program

## 2019-02-14 ENCOUNTER — Other Ambulatory Visit: Payer: Self-pay

## 2019-02-14 ENCOUNTER — Encounter
Admission: RE | Admit: 2019-02-14 | Discharge: 2019-02-14 | Disposition: A | Discharge status: Home / Self Care | Payer: Medicare Other | Source: Ambulatory Visit | Attending: Neurosurgery | Admitting: Neurosurgery

## 2019-02-14 ENCOUNTER — Inpatient Hospital Stay: Payer: Medicare Other | Admitting: Anesthesiology

## 2019-02-14 ENCOUNTER — Telehealth: Payer: Self-pay | Admitting: Internal Medicine

## 2019-02-14 DIAGNOSIS — Z01818 Encounter for other preprocedural examination: Secondary | ICD-10-CM | POA: Diagnosis not present

## 2019-02-14 DIAGNOSIS — Z01810 Encounter for preprocedural cardiovascular examination: Secondary | ICD-10-CM | POA: Diagnosis not present

## 2019-02-14 LAB — URINALYSIS, ROUTINE W REFLEX MICROSCOPIC
Bilirubin Urine: NEGATIVE
Glucose, UA: NEGATIVE mg/dL
Hgb urine dipstick: NEGATIVE
Ketones, ur: NEGATIVE mg/dL
Leukocytes,Ua: NEGATIVE
Nitrite: NEGATIVE
Protein, ur: NEGATIVE mg/dL
Specific Gravity, Urine: 1.01 (ref 1.005–1.030)
pH: 7 (ref 5.0–8.0)

## 2019-02-14 LAB — PROTIME-INR
INR: 0.9 (ref 0.8–1.2)
Prothrombin Time: 12.2 seconds (ref 11.4–15.2)

## 2019-02-14 LAB — BASIC METABOLIC PANEL
Anion gap: 9 (ref 5–15)
BUN: 16 mg/dL (ref 8–23)
CO2: 27 mmol/L (ref 22–32)
Calcium: 9.1 mg/dL (ref 8.9–10.3)
Chloride: 101 mmol/L (ref 98–111)
Creatinine, Ser: 1.1 mg/dL — ABNORMAL HIGH (ref 0.44–1.00)
GFR calc Af Amer: 56 mL/min — ABNORMAL LOW (ref >60)
GFR calc non Af Amer: 48 mL/min — ABNORMAL LOW (ref >60)
Glucose, Bld: 82 mg/dL (ref 70–99)
Potassium: 3.8 mmol/L (ref 3.5–5.1)
Sodium: 137 mmol/L (ref 135–145)

## 2019-02-14 LAB — SURGICAL PCR SCREEN
MRSA, PCR: NEGATIVE
Staphylococcus aureus: NEGATIVE

## 2019-02-14 LAB — CBC
HCT: 34.9 % — ABNORMAL LOW (ref 36.0–46.0)
Hemoglobin: 11.1 g/dL — ABNORMAL LOW (ref 12.0–15.0)
MCH: 29 pg (ref 26.0–34.0)
MCHC: 31.8 g/dL (ref 30.0–36.0)
MCV: 91.1 fL (ref 80.0–100.0)
Platelets: 266 10*3/uL (ref 150–400)
RBC: 3.83 MIL/uL — ABNORMAL LOW (ref 3.87–5.11)
RDW: 13.4 % (ref 11.5–15.5)
WBC: 6.9 10*3/uL (ref 4.0–10.5)
nRBC: 0 % (ref 0.0–0.2)

## 2019-02-14 LAB — APTT: aPTT: 32 seconds (ref 24–36)

## 2019-02-14 NOTE — Telephone Encounter (Signed)
I attempted to call the patient on her cell #- N/A & mail box if full. I attempted to call her at her home #- per her husband, the patient is currently at a doctor's appointment.   I advised I will call back at a later time.

## 2019-02-14 NOTE — Patient Instructions (Signed)
Your procedure is scheduled on: Wednesday 02/21/19.  Report to DAY SURGERY DEPARTMENT LOCATED ON 2ND FLOOR MEDICAL MALL ENTRANCE. To find out your arrival time please call [phone] between 1PM - 3PM on Tuesday 02/20/19.   Remember: Instructions that are not followed completely may result in serious medical risk, up to and including death, or upon the discretion of your surgeon and anesthesiologist your surgery may need to be rescheduled.      _X__ 1. Do not eat food after midnight the night before your procedure.                 No gum chewing or hard candies. You may drink clear liquids up to 2 hours                 before you are scheduled to arrive for your surgery- DO NOT drink clear                 liquids within 2 hours of the start of your surgery.                 Clear Liquids include:  water, apple juice without pulp, clear carbohydrate                 drink such as Clearfast or Gatorade, Black Coffee or Tea (Do not add                 milk or creamer to coffee or tea).   __X__2.  On the morning of surgery brush your teeth with toothpaste and water, you may rinse your mouth with mouthwash if you wish.  Do not swallow any toothpaste or mouthwash.      _X__ 3.  No Alcohol for 24 hours before or after surgery.   __X__4.  Notify your doctor if there is any change in your medical condition      (cold, fever, infections).      Do not wear jewelry, make-up, hairpins, clips or nail polish. Do not wear lotions, powders, or perfumes.  Do not shave 48 hours prior to surgery. Men may shave face and neck. Do not bring valuables to the hospital.     [facility] is not responsible for any belongings or valuables.   Contacts, dentures/partials or body piercings may not be worn into surgery. Bring a case for your contacts, glasses or hearing aids, a denture cup will be supplied.   Leave your suitcase in the car. After surgery it may be brought to your room.   For patients  admitted to the hospital, discharge time is determined by your treatment team.    Patients discharged the day of surgery will not be allowed to drive home.    Please read over the following fact sheets that you were given:   MRSA Information   __X__ Take these medicines the morning of surgery with A SIP OF WATER:     1. buPROPion (WELLBUTRIN XL) 300 MG 24 hr tablet  2. DULoxetine (CYMBALTA) 60 MG capsule  3. famotidine (PEPCID) 20 MG tablet   4. atorvastatin (LIPITOR) 80 MG tablet  5. oxyCODONE-acetaminophen (PERCOCET) 5-325 MG tablet if needed  6. fluticasone (FLONASE) 50 MCG/ACT nasal spray if needed     __X__ Use CHG Soap as directed   _ X___ Use inhalers on the day of surgery. Also bring the inhaler with you to the hospital on the morning of surgery.  tiotropium (SPIRIVA) 18 MCG inhalation capsule  VENTOLIN HFA  108 (90 Base) MCG/ACT inhaler   __X__ Stop Blood Thinners Plavix according to Dr. Remo Lipps Klein's instructions.   __X__ Stop Anti-inflammatories 7 days before surgery such as Advil, Ibuprofen, Motrin, BC or Goodies Powder, Naprosyn, Naproxen, Aleve, Aspirin, Meloxicam. May take Tylenol or Oxycodone if needed for pain or discomfort.    __X__ Don't start any new herbal supplements until after your surgery.

## 2019-02-14 NOTE — Telephone Encounter (Signed)
The patient was started on plavix 75 mg once daily by Dr. Leonie Man (neurology) at hospital discharge from her stroke 08/12/17.  I spoke with Heather in pre-admit. She confirms the patient is having lumbar fusion of her L2-L3. They are not requesting cardiac clearance, just instructions on how long the patient will need to hold plavix prior to her procedure.   Per Nira Conn, the patient advised she has not re-started taking this since she picked it up. Heather did not know the date the RX was picked up.   Surgery is scheduled for Wednesday 02/21/2019.  I advised I will review with Dr. Caryl Comes when he is in clinic tomorrow. They ask that we call the patient directly with her instructions as they have already seen her. We can also fax Dr. Olin Pia recommendations to Pre-Admit at [phone].  To Dr. Caryl Comes to review.

## 2019-02-14 NOTE — Pre-Procedure Instructions (Signed)
EKG okay per Dr. Rosey Bath. Patient seen by Cardiologist on 02/01/19 and was cleared to follow-up in one year.

## 2019-02-14 NOTE — Telephone Encounter (Signed)
New Message  Heather from Pre Admit testing is calling and Pt is having surgery next Wednesday and they are wondering about her medication Plavix how long it needs to be held Please call back

## 2019-02-15 NOTE — Telephone Encounter (Signed)
Ok to hold plavix 5 days Resume when safe post op

## 2019-02-15 NOTE — Telephone Encounter (Signed)
Copy of this phone encounter was faxed to Pre-Admit testing at [phone]. Confirmation received.

## 2019-02-15 NOTE — Telephone Encounter (Signed)
I spoke with the patient. She is aware to hold plavix for 5 days prior to her back surgery per Dr. Caryl Comes- last dose would be today. Per the patient, she has already been off of this a week due to running out and then a delay in getting her refill picked up.   She is aware to not take any plavix after today, so she should just stay off of this.   I have also advised her that she should resume her plavix post - op when the performing surgeon feels it is safe for her to do so.  The patient voices understanding and is agreeable.

## 2019-02-15 NOTE — Telephone Encounter (Signed)
I spoke with the patient regarding his ILR transmission. She his aware this did not show anything that could have contributed to her sweating episode.  She still has a TSH pending for [place]. I have advised the patient to call us when she is ready to have this done.  She is having back surgery next week and will call back after she recovers from this to schedule.

## 2019-02-16 ENCOUNTER — Ambulatory Visit: Payer: Medicare Other

## 2019-02-19 ENCOUNTER — Other Ambulatory Visit: Payer: Self-pay | Admitting: Family

## 2019-02-19 DIAGNOSIS — Z1231 Encounter for screening mammogram for malignant neoplasm of breast: Secondary | ICD-10-CM

## 2019-02-20 ENCOUNTER — Ambulatory Visit (INDEPENDENT_AMBULATORY_CARE_PROVIDER_SITE_OTHER): Payer: Medicare Other | Admitting: *Deleted

## 2019-02-20 ENCOUNTER — Other Ambulatory Visit: Payer: Self-pay

## 2019-02-20 DIAGNOSIS — I639 Cerebral infarction, unspecified: Secondary | ICD-10-CM | POA: Diagnosis not present

## 2019-02-20 LAB — CUP PACEART REMOTE DEVICE CHECK
Date Time Interrogation Session: 20200317224011
Implantable Pulse Generator Implant Date: 20180918

## 2019-02-21 ENCOUNTER — Encounter: Payer: Self-pay | Admitting: *Deleted

## 2019-02-21 ENCOUNTER — Ambulatory Visit
Admission: RE | Admit: 2019-02-21 | Discharge: 2019-02-21 | Disposition: A | Discharge status: Home / Self Care | Payer: Medicare Other | Attending: Neurosurgery | Admitting: Neurosurgery

## 2019-02-21 ENCOUNTER — Other Ambulatory Visit: Payer: Self-pay

## 2019-02-21 ENCOUNTER — Encounter
Admission: RE | Disposition: A | Discharge status: Home / Self Care | Payer: Self-pay | Source: Home / Self Care | Attending: Neurosurgery

## 2019-02-21 DIAGNOSIS — M48061 Spinal stenosis, lumbar region without neurogenic claudication: Secondary | ICD-10-CM | POA: Diagnosis not present

## 2019-02-21 DIAGNOSIS — Z538 Procedure and treatment not carried out for other reasons: Secondary | ICD-10-CM | POA: Insufficient documentation

## 2019-02-21 SURGERY — FOR MAXIMUM ACCESS (MAS) POSTERIOR LUMBAR INTERBODY FUSION (PLIF) 1 LEVEL
Anesthesia: General

## 2019-02-21 MED ORDER — VANCOMYCIN HCL IN DEXTROSE 1-5 GM/200ML-% IV SOLN: 1000.0000 mg | Freq: Once | INTRAVENOUS | Status: DC

## 2019-02-21 MED ORDER — CEFAZOLIN SODIUM-DEXTROSE 2-4 GM/100ML-% IV SOLN: 2.0000 g | Freq: Once | INTRAVENOUS | Status: DC

## 2019-02-21 MED ORDER — VANCOMYCIN HCL IN DEXTROSE 1-5 GM/200ML-% IV SOLN
INTRAVENOUS | Status: AC
  Filled 2019-02-21: qty 200

## 2019-02-21 MED ORDER — CEFAZOLIN SODIUM-DEXTROSE 2-4 GM/100ML-% IV SOLN
INTRAVENOUS | Status: AC
  Filled 2019-02-21: qty 100

## 2019-02-21 MED ORDER — LACTATED RINGERS IV SOLN: INTRAVENOUS | Status: DC

## 2019-02-21 SURGICAL SUPPLY — 59 items
BUR NEURO DRILL SOFT 3.0X3.8M (BURR) ×2 IMPLANT
CANISTER SUCT 1200ML W/VALVE (MISCELLANEOUS) ×4 IMPLANT
CHLORAPREP W/TINT 26 (MISCELLANEOUS) ×8 IMPLANT
CORD BIP STRL DISP 12FT (MISCELLANEOUS) ×2 IMPLANT
COUNTER NEEDLE 20/40 LG (NEEDLE) ×2 IMPLANT
COVER LIGHT HANDLE STERIS (MISCELLANEOUS) ×8 IMPLANT
COVER WAND RF STERILE (DRAPES) ×2 IMPLANT
CRADLE LAMINECT ARM (MISCELLANEOUS) ×4 IMPLANT
CUP MEDICINE 2OZ PLAST GRAD ST (MISCELLANEOUS) ×2 IMPLANT
DERMABOND ADVANCED (GAUZE/BANDAGES/DRESSINGS) ×2
DERMABOND ADVANCED .7 DNX12 (GAUZE/BANDAGES/DRESSINGS) ×2 IMPLANT
DRAPE C-ARM 42X72 X-RAY (DRAPES) ×4 IMPLANT
DRAPE C-ARMOR (DRAPES) ×4 IMPLANT
DRAPE INCISE IOBAN 66X45 STRL (DRAPES) ×4 IMPLANT
DRAPE LAPAROTOMY 100X77 ABD (DRAPES) ×4 IMPLANT
DRAPE MICROSCOPE SPINE 48X150 (DRAPES) ×2 IMPLANT
DRAPE POUCH INSTRU U-SHP 10X18 (DRAPES) ×2 IMPLANT
DRAPE SURG 17X11 SM STRL (DRAPES) ×16 IMPLANT
DRAPE TABLE BACK 80X90 (DRAPES) ×2 IMPLANT
DRSG OPSITE POSTOP 4X6 (GAUZE/BANDAGES/DRESSINGS) IMPLANT
ELECT CAUTERY BLADE TIP 2.5 (TIP) ×4
ELECT EZSTD 165MM 6.5IN (MISCELLANEOUS) ×2
ELECT REM PT RETURN 9FT ADLT (ELECTROSURGICAL) ×4
ELECTRODE CAUTERY BLDE TIP 2.5 (TIP) ×2 IMPLANT
ELECTRODE EZSTD 165MM 6.5IN (MISCELLANEOUS) ×1 IMPLANT
ELECTRODE REM PT RTRN 9FT ADLT (ELECTROSURGICAL) ×2 IMPLANT
FEE INTRAOP MONITOR IMPULS NCS (MISCELLANEOUS) IMPLANT
FRAME EYE SHIELD (PROTECTIVE WEAR) ×4 IMPLANT
GLOVE BIOGEL PI IND STRL 7.0 (GLOVE) ×2 IMPLANT
GLOVE BIOGEL PI INDICATOR 7.0 (GLOVE) ×2
GLOVE SURG SYN 7.0 (GLOVE) ×8 IMPLANT
GLOVE SURG SYN 8.5  E (GLOVE) ×6
GLOVE SURG SYN 8.5 E (GLOVE) ×6 IMPLANT
GOWN SRG XL LVL 3 NONREINFORCE (GOWNS) ×2 IMPLANT
GOWN STRL NON-REIN TWL XL LVL3 (GOWNS) ×2
GOWN STRL REUS W/TWL MED LVL3 (GOWN DISPOSABLE) ×4 IMPLANT
GRADUATE 1200CC STRL 31836 (MISCELLANEOUS) ×2 IMPLANT
INTRAOP MONITOR FEE IMPULS NCS (MISCELLANEOUS)
INTRAOP MONITOR FEE IMPULSE (MISCELLANEOUS)
KIT SPINAL PRONEVIEW (KITS) ×2 IMPLANT
KIT TURNOVER KIT A (KITS) ×2 IMPLANT
KNIFE BAYONET SHORT DISCETOMY (MISCELLANEOUS) IMPLANT
MARKER SKIN DUAL TIP RULER LAB (MISCELLANEOUS) ×6 IMPLANT
NDL SAFETY ECLIPSE 18X1.5 (NEEDLE) ×1 IMPLANT
NEEDLE HYPO 18GX1.5 SHARP (NEEDLE) ×1
NEEDLE HYPO 22GX1.5 SAFETY (NEEDLE) ×2 IMPLANT
PACK LAMINECTOMY NEURO (CUSTOM PROCEDURE TRAY) ×2 IMPLANT
PAD ARMBOARD 7.5X6 YLW CONV (MISCELLANEOUS) ×2 IMPLANT
PENCIL ELECTRO HAND CTR (MISCELLANEOUS) ×2 IMPLANT
SPOGE SURGIFLO 8M (HEMOSTASIS) ×1
SPONGE SURGIFLO 8M (HEMOSTASIS) ×1 IMPLANT
SUT DVC VLOC 3-0 CL 6 P-12 (SUTURE) ×2 IMPLANT
SUT VIC AB 0 CT1 27 (SUTURE) ×1
SUT VIC AB 0 CT1 27XCR 8 STRN (SUTURE) ×1 IMPLANT
SUT VIC AB 2-0 CT1 18 (SUTURE) ×2 IMPLANT
SYR 30ML LL (SYRINGE) ×4 IMPLANT
TOWEL OR 17X26 4PK STRL BLUE (TOWEL DISPOSABLE) ×6 IMPLANT
TRAY FOLEY MTR SLVR 16FR STAT (SET/KITS/TRAYS/PACK) IMPLANT
TUBING CONNECTING 10 (TUBING) ×6 IMPLANT

## 2019-02-26 ENCOUNTER — Encounter: Payer: Medicare Other | Admitting: Student in an Organized Health Care Education/Training Program

## 2019-02-28 NOTE — Progress Notes (Signed)
Carelink Summary Report / Loop Recorder 

## 2019-03-07 ENCOUNTER — Other Ambulatory Visit: Payer: Self-pay | Admitting: Internal Medicine

## 2019-03-08 ENCOUNTER — Telehealth: Payer: Self-pay | Admitting: *Deleted

## 2019-03-08 ENCOUNTER — Telehealth: Payer: Self-pay

## 2019-03-08 MED ORDER — PREGABALIN 50 MG PO CAPS: 50.0000 mg | ORAL_CAPSULE | Freq: Three times a day (TID) | ORAL | 2 refills | Status: DC

## 2019-03-08 NOTE — Telephone Encounter (Signed)
Called Melody Ochoa and notified her that a order for Lyrica was called in to her pharm. Instructed her that if unable to take tid r/t being sleepy she could take one pill in am and two at night. Verbal orders from Dr Holley Raring to take that way, that he wanted her to take 150 mg a day.

## 2019-03-08 NOTE — Telephone Encounter (Signed)
Called Melody Ochoa. She stated  that her surgery  has been postponed until 04/09/2019. (May be later). She states that she is hurting bad and is very difficultly to get out of bed and move around. [name] is using a cane and also has a walker if needed.Rating her pain 10/10 in lower back, radiating around to left groin, down back of right leg to foot (Numbness)  She is taking Oxycodone 5/325mg  1-2 tabs daily and is wondering if there is anything else she can do.   I will be glad to call her back.

## 2019-03-08 NOTE — Telephone Encounter (Signed)
Recommend pt increase Lyrica to 50 mg TID.  If TID dosing is causing excessive daytime sedation, can do 50 mg qAM and 100 mg qhs.  Rx sent int.  Requested Prescriptions   Signed Prescriptions Disp Refills  . pregabalin (LYRICA) 50 MG capsule 90 capsule 2    Sig: Take 1 capsule (50 mg total) by mouth 3 (three) times daily.    Authorizing Provider: Gillis Santa

## 2019-03-14 ENCOUNTER — Encounter: Payer: Self-pay | Admitting: Family

## 2019-03-14 ENCOUNTER — Ambulatory Visit (INDEPENDENT_AMBULATORY_CARE_PROVIDER_SITE_OTHER): Payer: Medicare Other | Admitting: Family

## 2019-03-14 DIAGNOSIS — M5416 Radiculopathy, lumbar region: Secondary | ICD-10-CM

## 2019-03-14 DIAGNOSIS — Z13820 Encounter for screening for osteoporosis: Secondary | ICD-10-CM

## 2019-03-14 DIAGNOSIS — M8580 Other specified disorders of bone density and structure, unspecified site: Secondary | ICD-10-CM

## 2019-03-14 DIAGNOSIS — D649 Anemia, unspecified: Secondary | ICD-10-CM | POA: Diagnosis not present

## 2019-03-14 NOTE — Patient Instructions (Signed)
Pick up stool card from out front desk  Call and schedule labs in the last few weeks, these are non fasting. Hydrate before labs.   Please call call and schedule your bone density scan as discussed with your mammogram   [facility]  [place], [place]    Let me know if you need anything at all.

## 2019-03-14 NOTE — Progress Notes (Signed)
This visit type was conducted due to national recommendations for restrictions regarding the COVID-19 pandemic (e.g. social distancing).  This format is felt to be most appropriate for this patient at this time.  All issues noted in this document were discussed and addressed.  No physical exam was performed (except for noted visual exam findings with Video Visits). Interactive audio and video telecommunications were attempted between this provider and patient, however failed, due to patient having technical difficulties or patient did not have access to video capability.  We continued and completed visit with audio only.   Virtual Visit via Video Note  I connected with@ on 03/19/19 at  1:30 PM EDT by a video enabled telemedicine application and verified that I am speaking with the correct person using two identifiers.  Location patient: home Location provider:work Persons participating in the virtual visit: patient, provider  I discussed the limitations of evaluation and management by telemedicine and the availability of in person appointments. The patient expressed understanding and agreed to proceed.   HPI:   Overall feels well. No new complaints.   Has seen ENT, Dr Pryor Ochoa, for 'tickle' in throat which has resolved.  No SOB, dysphagia, hoarseness.    Chronic low back pain- Following with Pain Management- Follows with Dr. Di Kindle  Recent labs with [facility]. Not on iron supplement currently. Last colonoscopy in 2017.   Not on NSAID.   As seen Dr. Cari Caraway, worsening degenerative disease L2-L3 above her fusion. Scheduled for Apr 09, 2019.  Due for bone density.   ROS: See pertinent positives and negatives per HPI.  Past Medical History:  Diagnosis Date  . Arthritis   . Atheromatous plaque 10/18/2017  . Back pain   . Cancer ([place])    skin  . Depression   . Diverticulosis   . Dizziness    patient had episode of dizziness when came in room. hx past no dx  . GERD  (gastroesophageal reflux disease)   . Heart murmur   . Hyperlipidemia 10/18/2017  . PONV (postoperative nausea and vomiting)    yrs ago  . Stroke [facility])     Past Surgical History:  Procedure Laterality Date  . BACK SURGERY  13  . BREAST BIOPSY Bilateral    cores "years ago"  . BREAST EXCISIONAL BIOPSY Left 90's  . CATARACT EXTRACTION W/PHACO Left 12/20/2018   Procedure: CATARACT EXTRACTION PHACO AND INTRAOCULAR LENS PLACEMENT ([place]) LEFT TOPICAL;  Surgeon: Leandrew Koyanagi, MD;  Location: [place];  Service: Ophthalmology;  Laterality: Left;  . CATARACT EXTRACTION W/PHACO Right 01/24/2019   Procedure: CATARACT EXTRACTION PHACO AND INTRAOCULAR LENS PLACEMENT ([place]) RIGHT;  Surgeon: Leandrew Koyanagi, MD;  Location: [place];  Service: Ophthalmology;  Laterality: Right;  . CERVICAL DISC SURGERY  09  . COLONOSCOPY WITH PROPOFOL N/A 01/13/2016   Procedure: COLONOSCOPY WITH PROPOFOL;  Surgeon: [name], MD;  Location: [facility] ENDOSCOPY;  Service: Endoscopy;  Laterality: N/A;  . FACELIFT  94  . LOOP RECORDER INSERTION N/A 08/23/2017   Procedure: LOOP RECORDER INSERTION;  Surgeon: Thompson Grayer, MD;  Location: [place] CV LAB;  Service: Cardiovascular;  Laterality: N/A;  . TEE WITHOUT CARDIOVERSION N/A 08/12/2017   Procedure: TRANSESOPHAGEAL ECHOCARDIOGRAM (TEE);  Surgeon: Sueanne Margarita, MD;  Location: [facility] ENDOSCOPY;  Service: Cardiovascular;  Laterality: N/A;  . TONSILLECTOMY    . TOTAL HIP ARTHROPLASTY Right 04/22/2016   Procedure: TOTAL HIP ARTHROPLASTY;  Surgeon: Corky Mull, MD;  Location: [facility] ORS;  Service: Orthopedics;  Laterality: Right;  Family History  Problem Relation Age of Onset  . Lung cancer Father   . Aneurysm Brother   . Stroke Brother   . Diabetes Maternal Grandmother   . Kidney disease Maternal Grandfather   . Cushing syndrome Paternal Grandmother   . Dementia Paternal Grandfather   . Breast cancer Maternal Aunt 42    SOCIAL HX: Former  smoker   Current Outpatient Medications:  .  acetaminophen (TYLENOL) 500 MG tablet, Take 500 mg by mouth 2 (two) times daily. , Disp: , Rfl:  .  atorvastatin (LIPITOR) 80 MG tablet, Take 1 tablet (80 mg total) by mouth daily at 6 PM., Disp: 90 tablet, Rfl: 0 .  azithromycin (ZITHROMAX) 250 MG tablet, TAKE 1 TABLET (250 MG TOTAL) BY MOUTH DAILY. TAKE 2 TABS AT ONCE ON THE FIRST DAY, THEN ONCE DAILY., Disp: 6 tablet, Rfl: 0 .  buPROPion (WELLBUTRIN XL) 300 MG 24 hr tablet, Take 300 mg by mouth daily. , Disp: , Rfl:  .  Calcium Carbonate-Vitamin D (CALTRATE 600+D PO), Take 1 tablet by mouth daily., Disp: , Rfl:  .  clopidogrel (PLAVIX) 75 MG tablet, TAKE 1 TABLET BY MOUTH EVERY DAY, Disp: 30 tablet, Rfl: 11 .  DULoxetine (CYMBALTA) 60 MG capsule, Take 60 mg by mouth 2 (two) times daily. , Disp: , Rfl:  .  famotidine (PEPCID) 20 MG tablet, Take 20 mg by mouth at bedtime. , Disp: , Rfl: 12 .  ferrous sulfate 325 (65 FE) MG tablet, Take 1 tablet (325 mg total) by mouth daily., Disp: 30 tablet, Rfl: 0 .  fluticasone (FLONASE) 50 MCG/ACT nasal spray, Place 1 spray into both nostrils daily as needed for allergies or rhinitis (congestion). , Disp: , Rfl:  .  NON FORMULARY, Place 1 drop into the right eye daily. (Prednisolone Acetate/Gatifloxacin), Disp: , Rfl:  .  [START ON 03/22/2019] oxyCODONE-acetaminophen (PERCOCET) 5-325 MG tablet, Take 1-2 tablets by mouth daily as needed for up to 30 days for severe pain. (Patient not taking: Reported on 02/02/2019), Disp: 60 tablet, Rfl: 0 .  oxyCODONE-acetaminophen (PERCOCET) 5-325 MG tablet, Take 1-2 tablets by mouth daily as needed for up to 30 days for severe pain. (Patient not taking: Reported on 02/02/2019), Disp: 60 tablet, Rfl: 0 .  pregabalin (LYRICA) 50 MG capsule, Take 1 capsule (50 mg total) by mouth 3 (three) times daily., Disp: 90 capsule, Rfl: 2 .  tiotropium (SPIRIVA) 18 MCG inhalation capsule, Place 1 capsule (18 mcg total) into inhaler and inhale daily  for 30 days., Disp: 30 capsule, Rfl: 12 .  triamcinolone cream (KENALOG) 0.1 %, Apply 1 application topically 2 (two) times daily as needed (rash/skin irritation.). , Disp: , Rfl: 0 .  VENTOLIN HFA 108 (90 Base) MCG/ACT inhaler, TAKE 2 PUFFS BY MOUTH EVERY 6 HOURS AS NEEDED FOR WHEEZE OR SHORTNESS OF BREATH (Patient taking differently: Inhale 2 puffs into the lungs every 6 (six) hours as needed for wheezing or shortness of breath. ), Disp: 18 Inhaler, Rfl: 0     ASSESSMENT AND PLAN:  Discussed the following assessment and plan:  Anemia, unspecified type - Plan: Fecal occult blood, imunochemical, Ferritin, IBC panel, CBC with Differential/Platelet, Basic metabolic panel  Lumbar radiculopathy  Screening for osteoporosis - Plan: DG Bone Density  Osteopenia, unspecified location  Problem List Items Addressed This Visit      Nervous and Auditory   Lumbar radiculopathy    Currently following with, Dr. Holley Raring, pain management.        Musculoskeletal  and Integument   Osteopenia    Due for bone density, ordered today.  Patient understands to schedule.        Other   Anemia - Primary    Normocytic anemia.  Up-to-date colonoscopy.  Pending stool cards, further labs.      Relevant Orders   Fecal occult blood, imunochemical   Ferritin   IBC panel   CBC with Differential/Platelet   Basic metabolic panel    Other Visit Diagnoses    Screening for osteoporosis       Relevant Orders   DG Bone Density        I discussed the assessment and treatment plan with the patient. The patient was provided an opportunity to ask questions and all were answered. The patient agreed with the plan and demonstrated an understanding of the instructions.   The patient was advised to call back or seek an in-person evaluation if the symptoms worsen or if the condition fails to improve as anticipated.  I provided 20 minutes of non-face-to-face time during this encounter.   Melody Paris, FNP

## 2019-03-14 NOTE — Progress Notes (Signed)
I called to let patient know that stool cards & hat were left at front desk for pick up.

## 2019-03-19 ENCOUNTER — Encounter: Payer: Self-pay | Admitting: Family

## 2019-03-19 DIAGNOSIS — F325 Major depressive disorder, single episode, in full remission: Secondary | ICD-10-CM | POA: Diagnosis not present

## 2019-03-19 DIAGNOSIS — D649 Anemia, unspecified: Secondary | ICD-10-CM | POA: Insufficient documentation

## 2019-03-19 NOTE — Assessment & Plan Note (Signed)
Normocytic anemia.  Up-to-date colonoscopy.  Pending stool cards, further labs.

## 2019-03-19 NOTE — Assessment & Plan Note (Signed)
Currently following with, Dr. Holley Raring, pain management.

## 2019-03-19 NOTE — Assessment & Plan Note (Signed)
Due for bone density, ordered today.  Patient understands to schedule.

## 2019-03-20 ENCOUNTER — Ambulatory Visit (INDEPENDENT_AMBULATORY_CARE_PROVIDER_SITE_OTHER): Payer: Medicare Other | Admitting: Family Medicine

## 2019-03-20 ENCOUNTER — Other Ambulatory Visit: Payer: Self-pay

## 2019-03-20 ENCOUNTER — Encounter: Payer: Self-pay | Admitting: Family

## 2019-03-20 DIAGNOSIS — J8410 Pulmonary fibrosis, unspecified: Secondary | ICD-10-CM | POA: Diagnosis not present

## 2019-03-20 DIAGNOSIS — R053 Chronic cough: Secondary | ICD-10-CM

## 2019-03-20 DIAGNOSIS — R05 Cough: Secondary | ICD-10-CM | POA: Diagnosis not present

## 2019-03-20 NOTE — Progress Notes (Signed)
Patient ID: EMI LYMON, female   DOB: February 13, 1941, 78 y.o.   MRN: [id number]  Virtual Visit via Video Note  This visit type was conducted due to national recommendations for restrictions regarding the COVID-19 pandemic (e.g. social distancing).  This format is felt to be most appropriate for this patient at this time.  All issues noted in this document were discussed and addressed.  No physical exam was performed (except for noted visual exam findings with Video Visits).   I connected with Melody Ochoa on 03/21/19 at  2:20 PM EDT by a telephone and verified that I am speaking with the correct person using two identifiers. Location patient: home Location provider: [place] Persons participating in the virtual visit: patient, provider  I discussed the limitations, risks, security and privacy concerns of performing an evaluation and management service by telephone and the availability of in person appointments. I also discussed with the patient that there may be a patient responsible charge related to this service. The patient expressed understanding and agreed to proceed.  Interactive audio and video telecommunications were attempted between this provider and patient, however failed, due to patient having technical difficulties OR patient did not have access to video capability.    Reason for visit: Cough x3-4 months  HPI:  Patient and I connected via telephone today to discuss chronic cough is been present for 3 to 4 months.  Patient states she is getting frustrated with the cough.  Denies any fever or chills.  Denies shortness of breath or wheezing.  Does report some clear phlegm at times with cough, but usually is dry.  Denies any chest pain or palpitations.  Denies GI or GU issues.  Patient does take Spiriva every day and also has albuterol inhaler to use as needed.  Patient states "I do not know why I am on these medications".  When asked patient if she has tried any  over-the-counter cough medications such as Mucinex or Delsym, states that she has in the past and they do work but currently has not been using them due to them making her have some dry mouth.  I was able to review back in patient's chart, she did see pulmonology in February 2020 and has a diagnosis of pulmonary fibrosis.  This diagnosis explains why she is on Spiriva and has albuterol inhaler.  She was sent to pulmonology due to the chronic cough.  She also has had a CT scan of the chest in September 2019, I was able to review the CT scan  Imaging via epic.  ROS: See pertinent positives and negatives per HPI.  Past Medical History:  Diagnosis Date  . Arthritis   . Atheromatous plaque 10/18/2017  . Back pain   . Cancer ([place])    skin  . Depression   . Diverticulosis   . Dizziness    patient had episode of dizziness when came in room. hx past no dx  . GERD (gastroesophageal reflux disease)   . Heart murmur   . Hyperlipidemia 10/18/2017  . PONV (postoperative nausea and vomiting)    yrs ago  . Stroke [facility])     Past Surgical History:  Procedure Laterality Date  . BACK SURGERY  13  . BREAST BIOPSY Bilateral    cores "years ago"  . BREAST EXCISIONAL BIOPSY Left 90's  . CATARACT EXTRACTION W/PHACO Left 12/20/2018   Procedure: CATARACT EXTRACTION PHACO AND INTRAOCULAR LENS PLACEMENT ([place]) LEFT TOPICAL;  Surgeon: Leandrew Koyanagi, MD;  Location: [place];  Service: Ophthalmology;  Laterality: Left;  . CATARACT EXTRACTION W/PHACO Right 01/24/2019   Procedure: CATARACT EXTRACTION PHACO AND INTRAOCULAR LENS PLACEMENT ([place]) RIGHT;  Surgeon: Leandrew Koyanagi, MD;  Location: [place];  Service: Ophthalmology;  Laterality: Right;  . CERVICAL DISC SURGERY  09  . COLONOSCOPY WITH PROPOFOL N/A 01/13/2016   Procedure: COLONOSCOPY WITH PROPOFOL;  Surgeon: [name], MD;  Location: [facility] ENDOSCOPY;  Service: Endoscopy;  Laterality: N/A;  . FACELIFT  94  . LOOP RECORDER  INSERTION N/A 08/23/2017   Procedure: LOOP RECORDER INSERTION;  Surgeon: Thompson Grayer, MD;  Location: [place] CV LAB;  Service: Cardiovascular;  Laterality: N/A;  . TEE WITHOUT CARDIOVERSION N/A 08/12/2017   Procedure: TRANSESOPHAGEAL ECHOCARDIOGRAM (TEE);  Surgeon: Sueanne Margarita, MD;  Location: [facility] ENDOSCOPY;  Service: Cardiovascular;  Laterality: N/A;  . TONSILLECTOMY    . TOTAL HIP ARTHROPLASTY Right 04/22/2016   Procedure: TOTAL HIP ARTHROPLASTY;  Surgeon: Corky Mull, MD;  Location: [facility] ORS;  Service: Orthopedics;  Laterality: Right;    Family History  Problem Relation Age of Onset  . Lung cancer Father   . Aneurysm Brother   . Stroke Brother   . Diabetes Maternal Grandmother   . Kidney disease Maternal Grandfather   . Cushing syndrome Paternal Grandmother   . Dementia Paternal Grandfather   . Breast cancer Maternal Aunt 52    Social History   Tobacco Use  . Smoking status: Former Smoker    Packs/day: 1.00    Years: 40.00    Pack years: 40.00    Types: Cigarettes    Last attempt to quit: 11/13/1996    Years since quitting: 22.3  . Smokeless tobacco: Never Used  Substance Use Topics  . Alcohol use: Yes    Comment: occ wine     Current Outpatient Medications:  .  acetaminophen (TYLENOL) 500 MG tablet, Take 500 mg by mouth 2 (two) times daily. , Disp: , Rfl:  .  atorvastatin (LIPITOR) 80 MG tablet, Take 1 tablet (80 mg total) by mouth daily at 6 PM., Disp: 90 tablet, Rfl: 0 .  azithromycin (ZITHROMAX) 250 MG tablet, TAKE 1 TABLET (250 MG TOTAL) BY MOUTH DAILY. TAKE 2 TABS AT ONCE ON THE FIRST DAY, THEN ONCE DAILY., Disp: 6 tablet, Rfl: 0 .  buPROPion (WELLBUTRIN XL) 300 MG 24 hr tablet, Take 300 mg by mouth daily. , Disp: , Rfl:  .  Calcium Carbonate-Vitamin D (CALTRATE 600+D PO), Take 1 tablet by mouth daily., Disp: , Rfl:  .  clopidogrel (PLAVIX) 75 MG tablet, TAKE 1 TABLET BY MOUTH EVERY DAY, Disp: 30 tablet, Rfl: 11 .  DULoxetine (CYMBALTA) 60 MG capsule, Take 60  mg by mouth 2 (two) times daily. , Disp: , Rfl:  .  famotidine (PEPCID) 20 MG tablet, Take 20 mg by mouth at bedtime. , Disp: , Rfl: 12 .  ferrous sulfate 325 (65 FE) MG tablet, Take 1 tablet (325 mg total) by mouth daily., Disp: 30 tablet, Rfl: 0 .  fluticasone (FLONASE) 50 MCG/ACT nasal spray, Place 1 spray into both nostrils daily as needed for allergies or rhinitis (congestion). , Disp: , Rfl:  .  NON FORMULARY, Place 1 drop into the right eye daily. (Prednisolone Acetate/Gatifloxacin), Disp: , Rfl:  .  [START ON 03/22/2019] oxyCODONE-acetaminophen (PERCOCET) 5-325 MG tablet, Take 1-2 tablets by mouth daily as needed for up to 30 days for severe pain., Disp: 60 tablet, Rfl: 0 .  oxyCODONE-acetaminophen (PERCOCET) 5-325 MG tablet, Take 1-2 tablets  by mouth daily as needed for up to 30 days for severe pain., Disp: 60 tablet, Rfl: 0 .  pregabalin (LYRICA) 50 MG capsule, Take 1 capsule (50 mg total) by mouth 3 (three) times daily., Disp: 90 capsule, Rfl: 2 .  triamcinolone cream (KENALOG) 0.1 %, Apply 1 application topically 2 (two) times daily as needed (rash/skin irritation.). , Disp: , Rfl: 0 .  VENTOLIN HFA 108 (90 Base) MCG/ACT inhaler, TAKE 2 PUFFS BY MOUTH EVERY 6 HOURS AS NEEDED FOR WHEEZE OR SHORTNESS OF BREATH (Patient taking differently: Inhale 2 puffs into the lungs every 6 (six) hours as needed for wheezing or shortness of breath. ), Disp: 18 Inhaler, Rfl: 0 .  tiotropium (SPIRIVA) 18 MCG inhalation capsule, Place 1 capsule (18 mcg total) into inhaler and inhale daily for 30 days., Disp: 30 capsule, Rfl: 12  EXAM:  Patient is alert and oriented x3.  She appears to be in no distress and speaking on the phone, and is not breathless and is able to talk in full sentences.  Patient does cough few times over the phone, cough does sound dry in nature.   ASSESSMENT AND PLAN:  Discussed the following assessment and plan:  Chronic cough  Pulmonary fibrosis (HCC)  Explained to patient that  her chronic cough is most likely related to her diagnosis of pulmonary fibrosis.  Also explained to patient that pulmonary fibrosis can cause a chronic cough symptom and cause feelings of breathlessness at times.  This is why she is on Spiriva every day and has albuterol as needed.  After my explanation, patient verbalizes understanding.  Also advised patient to try taking a Mucinex or Delsym if needed on days her cough is more bothersome.  Patient states she will try this as she has had success with these medications in the past.  Also encouraged patient to keep up good fluid intake and do good handwashing.  We also discussed patient wearing a mask when having to go out of the home to go grocery shopping or go to the pharmacy.   I discussed the assessment and treatment plan with the patient. The patient was provided an opportunity to ask questions and all were answered. The patient agreed with the plan and demonstrated an understanding of the instructions.   The patient was advised to call back or seek an in-person evaluation if the symptoms worsen or if the condition fails to improve as anticipated.  I provided 15 minutes of non-face-to-face time during this encounter.  Advised to keep follow-ups as planned with specialists and keep regular follow-up with PCP.   Jodelle Green, FNP

## 2019-03-21 ENCOUNTER — Encounter: Payer: Self-pay | Admitting: Family Medicine

## 2019-03-21 ENCOUNTER — Telehealth: Payer: Self-pay | Admitting: Nurse Practitioner

## 2019-03-21 NOTE — Telephone Encounter (Signed)
Pt states that she needs a refill of her oxy acet she has a med mgmt appt in may. I asked pt to call [facility] on [address] and see if there were any prescriptions that can be filled and she stated they don't have any prescriptions for her.

## 2019-03-22 MED ORDER — OXYCODONE-ACETAMINOPHEN 5-325 MG PO TABS: 1.0000 | ORAL_TABLET | Freq: Every day | ORAL | 0 refills | Status: AC | PRN

## 2019-03-22 NOTE — Telephone Encounter (Signed)
Patient's e-script for her oxycodone was not received by pharmacy. She will need one dated today. Dr. Holley Raring called and he is to e-scribe today.

## 2019-03-22 NOTE — Telephone Encounter (Signed)
See nursing note below. Rx sent into. Unclear why pharmacy did not receive previous e-script even though it confirmed receipt in Epic.  Requested Prescriptions   Signed Prescriptions Disp Refills  . oxyCODONE-acetaminophen (PERCOCET) 5-325 MG tablet 60 tablet 0    Sig: Take 1-2 tablets by mouth daily as needed for up to 30 days for severe pain.    Authorizing Provider: Gillis Santa

## 2019-03-26 ENCOUNTER — Ambulatory Visit (INDEPENDENT_AMBULATORY_CARE_PROVIDER_SITE_OTHER): Payer: Medicare Other | Admitting: *Deleted

## 2019-03-26 ENCOUNTER — Other Ambulatory Visit: Payer: Self-pay

## 2019-03-26 DIAGNOSIS — I639 Cerebral infarction, unspecified: Secondary | ICD-10-CM | POA: Diagnosis not present

## 2019-03-26 LAB — CUP PACEART REMOTE DEVICE CHECK
Date Time Interrogation Session: 20200419234132
Implantable Pulse Generator Implant Date: 20180918

## 2019-03-30 ENCOUNTER — Other Ambulatory Visit: Payer: Self-pay | Admitting: Internal Medicine

## 2019-03-30 NOTE — Telephone Encounter (Signed)
This is a [place] pt 

## 2019-04-02 DIAGNOSIS — L821 Other seborrheic keratosis: Secondary | ICD-10-CM | POA: Diagnosis not present

## 2019-04-02 DIAGNOSIS — D229 Melanocytic nevi, unspecified: Secondary | ICD-10-CM | POA: Diagnosis not present

## 2019-04-02 DIAGNOSIS — D2230 Melanocytic nevi of unspecified part of face: Secondary | ICD-10-CM | POA: Diagnosis not present

## 2019-04-02 DIAGNOSIS — D225 Melanocytic nevi of trunk: Secondary | ICD-10-CM | POA: Diagnosis not present

## 2019-04-02 DIAGNOSIS — Z85828 Personal history of other malignant neoplasm of skin: Secondary | ICD-10-CM | POA: Diagnosis not present

## 2019-04-02 DIAGNOSIS — L304 Erythema intertrigo: Secondary | ICD-10-CM | POA: Diagnosis not present

## 2019-04-02 DIAGNOSIS — B078 Other viral warts: Secondary | ICD-10-CM | POA: Diagnosis not present

## 2019-04-02 DIAGNOSIS — L812 Freckles: Secondary | ICD-10-CM | POA: Diagnosis not present

## 2019-04-02 DIAGNOSIS — L820 Inflamed seborrheic keratosis: Secondary | ICD-10-CM | POA: Diagnosis not present

## 2019-04-02 DIAGNOSIS — Z1283 Encounter for screening for malignant neoplasm of skin: Secondary | ICD-10-CM | POA: Diagnosis not present

## 2019-04-02 DIAGNOSIS — D1800 Hemangioma unspecified site: Secondary | ICD-10-CM | POA: Diagnosis not present

## 2019-04-02 DIAGNOSIS — L578 Other skin changes due to chronic exposure to nonionizing radiation: Secondary | ICD-10-CM | POA: Diagnosis not present

## 2019-04-03 NOTE — Progress Notes (Signed)
Carelink Summary Report / Loop Recorder 

## 2019-04-09 ENCOUNTER — Encounter: Payer: Self-pay | Admitting: Family

## 2019-04-10 ENCOUNTER — Ambulatory Visit: Payer: Medicare Other | Attending: Nurse Practitioner | Admitting: Nurse Practitioner

## 2019-04-10 ENCOUNTER — Other Ambulatory Visit: Payer: Self-pay

## 2019-04-12 ENCOUNTER — Ambulatory Visit: Payer: Medicare Other | Attending: Nurse Practitioner | Admitting: Nurse Practitioner

## 2019-04-12 ENCOUNTER — Other Ambulatory Visit: Payer: Self-pay

## 2019-04-12 DIAGNOSIS — Z01818 Encounter for other preprocedural examination: Secondary | ICD-10-CM | POA: Diagnosis not present

## 2019-04-12 DIAGNOSIS — Z1159 Encounter for screening for other viral diseases: Secondary | ICD-10-CM | POA: Diagnosis not present

## 2019-04-13 DIAGNOSIS — M48062 Spinal stenosis, lumbar region with neurogenic claudication: Secondary | ICD-10-CM | POA: Diagnosis present

## 2019-04-13 DIAGNOSIS — Z87891 Personal history of nicotine dependence: Secondary | ICD-10-CM | POA: Diagnosis not present

## 2019-04-13 DIAGNOSIS — M1612 Unilateral primary osteoarthritis, left hip: Secondary | ICD-10-CM | POA: Diagnosis present

## 2019-04-13 DIAGNOSIS — E785 Hyperlipidemia, unspecified: Secondary | ICD-10-CM | POA: Diagnosis present

## 2019-04-13 DIAGNOSIS — M419 Scoliosis, unspecified: Secondary | ICD-10-CM | POA: Diagnosis not present

## 2019-04-13 DIAGNOSIS — M47816 Spondylosis without myelopathy or radiculopathy, lumbar region: Secondary | ICD-10-CM | POA: Diagnosis not present

## 2019-04-13 DIAGNOSIS — J449 Chronic obstructive pulmonary disease, unspecified: Secondary | ICD-10-CM | POA: Diagnosis present

## 2019-04-13 DIAGNOSIS — Z981 Arthrodesis status: Secondary | ICD-10-CM | POA: Diagnosis not present

## 2019-04-13 DIAGNOSIS — Z8673 Personal history of transient ischemic attack (TIA), and cerebral infarction without residual deficits: Secondary | ICD-10-CM | POA: Diagnosis not present

## 2019-04-13 DIAGNOSIS — Z886 Allergy status to analgesic agent status: Secondary | ICD-10-CM | POA: Diagnosis not present

## 2019-04-13 DIAGNOSIS — F419 Anxiety disorder, unspecified: Secondary | ICD-10-CM | POA: Diagnosis present

## 2019-04-13 DIAGNOSIS — F329 Major depressive disorder, single episode, unspecified: Secondary | ICD-10-CM | POA: Diagnosis present

## 2019-04-13 DIAGNOSIS — M5416 Radiculopathy, lumbar region: Secondary | ICD-10-CM | POA: Diagnosis present

## 2019-04-13 DIAGNOSIS — M4800 Spinal stenosis, site unspecified: Secondary | ICD-10-CM | POA: Diagnosis not present

## 2019-04-13 DIAGNOSIS — IMO0002 Reserved for concepts with insufficient information to code with codable children: Secondary | ICD-10-CM | POA: Insufficient documentation

## 2019-04-13 DIAGNOSIS — M48061 Spinal stenosis, lumbar region without neurogenic claudication: Secondary | ICD-10-CM | POA: Diagnosis not present

## 2019-04-13 DIAGNOSIS — Z85828 Personal history of other malignant neoplasm of skin: Secondary | ICD-10-CM | POA: Diagnosis not present

## 2019-04-13 DIAGNOSIS — K5900 Constipation, unspecified: Secondary | ICD-10-CM | POA: Diagnosis present

## 2019-04-13 DIAGNOSIS — G894 Chronic pain syndrome: Secondary | ICD-10-CM | POA: Diagnosis present

## 2019-04-17 MED ORDER — ENOXAPARIN SODIUM 40 MG/0.4ML ~~LOC~~ SOLN
40.00 | SUBCUTANEOUS | Status: DC
Start: 2019-04-18 — End: 2019-04-17

## 2019-04-17 MED ORDER — TIOTROPIUM BROMIDE MONOHYDRATE 18 MCG IN CAPS
18.00 | ORAL_CAPSULE | RESPIRATORY_TRACT | Status: DC
Start: 2019-04-18 — End: 2019-04-17

## 2019-04-17 MED ORDER — HYDRALAZINE HCL 20 MG/ML IJ SOLN
10.00 | INTRAMUSCULAR | Status: DC
Start: ? — End: 2019-04-17

## 2019-04-17 MED ORDER — POLYETHYLENE GLYCOL 3350 17 G PO PACK
17.00 | PACK | ORAL | Status: DC
Start: 2019-04-17 — End: 2019-04-17

## 2019-04-17 MED ORDER — FAMOTIDINE 20 MG PO TABS
20.00 | ORAL_TABLET | ORAL | Status: DC
Start: 2019-04-17 — End: 2019-04-17

## 2019-04-17 MED ORDER — ATORVASTATIN CALCIUM 40 MG PO TABS
40.00 | ORAL_TABLET | ORAL | Status: DC
Start: 2019-04-18 — End: 2019-04-17

## 2019-04-17 MED ORDER — TIZANIDINE HCL 4 MG PO TABS
2.00 | ORAL_TABLET | ORAL | Status: DC
Start: ? — End: 2019-04-17

## 2019-04-17 MED ORDER — DULOXETINE HCL 30 MG PO CPEP
60.00 | ORAL_CAPSULE | ORAL | Status: DC
Start: 2019-04-18 — End: 2019-04-17

## 2019-04-17 MED ORDER — BUPROPION HCL ER (XL) 300 MG PO TB24
300.00 | ORAL_TABLET | ORAL | Status: DC
Start: 2019-04-18 — End: 2019-04-17

## 2019-04-17 MED ORDER — FLUTICASONE PROPIONATE 50 MCG/ACT NA SUSP
1.00 | NASAL | Status: DC
Start: ? — End: 2019-04-17

## 2019-04-17 MED ORDER — SENNOSIDES-DOCUSATE SODIUM 8.6-50 MG PO TABS
2.00 | ORAL_TABLET | ORAL | Status: DC
Start: 2019-04-17 — End: 2019-04-17

## 2019-04-17 MED ORDER — GENERIC EXTERNAL MEDICATION
Status: DC
Start: ? — End: 2019-04-17

## 2019-04-17 MED ORDER — GENERIC EXTERNAL MEDICATION
325.00 | Status: DC
Start: 2019-04-18 — End: 2019-04-17

## 2019-04-17 MED ORDER — SIMETHICONE 80 MG PO CHEW
80.00 | CHEWABLE_TABLET | ORAL | Status: DC
Start: ? — End: 2019-04-17

## 2019-04-17 MED ORDER — OXYCODONE HCL 5 MG PO TABS
5.00 | ORAL_TABLET | ORAL | Status: DC
Start: ? — End: 2019-04-17

## 2019-04-17 MED ORDER — ACETAMINOPHEN 325 MG PO TABS
975.00 | ORAL_TABLET | ORAL | Status: DC
Start: 2019-04-17 — End: 2019-04-17

## 2019-04-17 MED ORDER — PREGABALIN 50 MG PO CAPS
50.00 | ORAL_CAPSULE | ORAL | Status: DC
Start: 2019-04-17 — End: 2019-04-17

## 2019-04-17 MED ORDER — ALBUTEROL SULFATE (2.5 MG/3ML) 0.083% IN NEBU
2.50 | INHALATION_SOLUTION | RESPIRATORY_TRACT | Status: DC
Start: ? — End: 2019-04-17

## 2019-04-18 DIAGNOSIS — M4326 Fusion of spine, lumbar region: Secondary | ICD-10-CM | POA: Diagnosis not present

## 2019-04-18 DIAGNOSIS — R531 Weakness: Secondary | ICD-10-CM | POA: Diagnosis not present

## 2019-04-18 DIAGNOSIS — Z4789 Encounter for other orthopedic aftercare: Secondary | ICD-10-CM | POA: Diagnosis not present

## 2019-04-18 DIAGNOSIS — R2689 Other abnormalities of gait and mobility: Secondary | ICD-10-CM | POA: Diagnosis not present

## 2019-04-18 DIAGNOSIS — F329 Major depressive disorder, single episode, unspecified: Secondary | ICD-10-CM | POA: Diagnosis not present

## 2019-04-19 DIAGNOSIS — Z4789 Encounter for other orthopedic aftercare: Secondary | ICD-10-CM | POA: Diagnosis not present

## 2019-04-19 DIAGNOSIS — R531 Weakness: Secondary | ICD-10-CM | POA: Diagnosis not present

## 2019-04-19 DIAGNOSIS — F329 Major depressive disorder, single episode, unspecified: Secondary | ICD-10-CM | POA: Diagnosis not present

## 2019-04-19 DIAGNOSIS — R2689 Other abnormalities of gait and mobility: Secondary | ICD-10-CM | POA: Diagnosis not present

## 2019-04-19 DIAGNOSIS — M4326 Fusion of spine, lumbar region: Secondary | ICD-10-CM | POA: Diagnosis not present

## 2019-04-20 DIAGNOSIS — Z4789 Encounter for other orthopedic aftercare: Secondary | ICD-10-CM | POA: Diagnosis not present

## 2019-04-20 DIAGNOSIS — M4326 Fusion of spine, lumbar region: Secondary | ICD-10-CM | POA: Diagnosis not present

## 2019-04-20 DIAGNOSIS — R2689 Other abnormalities of gait and mobility: Secondary | ICD-10-CM | POA: Diagnosis not present

## 2019-04-20 DIAGNOSIS — F329 Major depressive disorder, single episode, unspecified: Secondary | ICD-10-CM | POA: Diagnosis not present

## 2019-04-20 DIAGNOSIS — R531 Weakness: Secondary | ICD-10-CM | POA: Diagnosis not present

## 2019-04-26 DIAGNOSIS — R531 Weakness: Secondary | ICD-10-CM | POA: Diagnosis not present

## 2019-04-26 DIAGNOSIS — Z4789 Encounter for other orthopedic aftercare: Secondary | ICD-10-CM | POA: Diagnosis not present

## 2019-04-26 DIAGNOSIS — M4326 Fusion of spine, lumbar region: Secondary | ICD-10-CM | POA: Diagnosis not present

## 2019-04-26 DIAGNOSIS — R2689 Other abnormalities of gait and mobility: Secondary | ICD-10-CM | POA: Diagnosis not present

## 2019-04-26 DIAGNOSIS — F329 Major depressive disorder, single episode, unspecified: Secondary | ICD-10-CM | POA: Diagnosis not present

## 2019-04-27 ENCOUNTER — Other Ambulatory Visit: Payer: Self-pay

## 2019-04-27 ENCOUNTER — Other Ambulatory Visit: Payer: Self-pay | Admitting: Family

## 2019-04-27 ENCOUNTER — Ambulatory Visit (INDEPENDENT_AMBULATORY_CARE_PROVIDER_SITE_OTHER): Payer: Medicare Other | Admitting: *Deleted

## 2019-04-27 DIAGNOSIS — R531 Weakness: Secondary | ICD-10-CM | POA: Diagnosis not present

## 2019-04-27 DIAGNOSIS — I639 Cerebral infarction, unspecified: Secondary | ICD-10-CM | POA: Diagnosis not present

## 2019-04-27 DIAGNOSIS — R058 Other specified cough: Secondary | ICD-10-CM

## 2019-04-27 DIAGNOSIS — J8410 Pulmonary fibrosis, unspecified: Secondary | ICD-10-CM

## 2019-04-27 DIAGNOSIS — R05 Cough: Secondary | ICD-10-CM

## 2019-04-27 DIAGNOSIS — Z4789 Encounter for other orthopedic aftercare: Secondary | ICD-10-CM | POA: Diagnosis not present

## 2019-04-27 DIAGNOSIS — F329 Major depressive disorder, single episode, unspecified: Secondary | ICD-10-CM | POA: Diagnosis not present

## 2019-04-27 DIAGNOSIS — R2689 Other abnormalities of gait and mobility: Secondary | ICD-10-CM | POA: Diagnosis not present

## 2019-04-27 DIAGNOSIS — M4326 Fusion of spine, lumbar region: Secondary | ICD-10-CM | POA: Diagnosis not present

## 2019-04-28 LAB — CUP PACEART REMOTE DEVICE CHECK
Date Time Interrogation Session: 20200522234205
Implantable Pulse Generator Implant Date: 20180918

## 2019-05-01 DIAGNOSIS — F329 Major depressive disorder, single episode, unspecified: Secondary | ICD-10-CM | POA: Diagnosis not present

## 2019-05-01 DIAGNOSIS — R2689 Other abnormalities of gait and mobility: Secondary | ICD-10-CM | POA: Diagnosis not present

## 2019-05-01 DIAGNOSIS — R531 Weakness: Secondary | ICD-10-CM | POA: Diagnosis not present

## 2019-05-01 DIAGNOSIS — Z4789 Encounter for other orthopedic aftercare: Secondary | ICD-10-CM | POA: Diagnosis not present

## 2019-05-01 DIAGNOSIS — M4326 Fusion of spine, lumbar region: Secondary | ICD-10-CM | POA: Diagnosis not present

## 2019-05-04 ENCOUNTER — Ambulatory Visit (INDEPENDENT_AMBULATORY_CARE_PROVIDER_SITE_OTHER): Payer: Medicare Other | Admitting: Family

## 2019-05-04 ENCOUNTER — Other Ambulatory Visit: Payer: Self-pay

## 2019-05-04 ENCOUNTER — Encounter: Payer: Self-pay | Admitting: Family

## 2019-05-04 DIAGNOSIS — M25512 Pain in left shoulder: Secondary | ICD-10-CM | POA: Diagnosis not present

## 2019-05-04 NOTE — Patient Instructions (Addendum)
I am concerned for rotator cuff pathology or shoulder impingement.    Come Monday 05/07/19 afternoon for xrays. No appointment needed.   Wear facial covering and you may not come in with any cough, cold symptoms.   You may use Tylenol for pain.  Use ice your shoulder 3 times a day for 20 minutes /day.  Stay safe!

## 2019-05-04 NOTE — Assessment & Plan Note (Addendum)
Worsened over 5 weeks.  Based on conversation today, working diagnosis includes shoulder impingement, rotator cuff pathology.   Pending xrays. Has loop recorder so I did not order MRI at this time.  Would like to see  Dr Marlou Sa, orthopedic, if needs to be seen after Xrays result.  She will use Tylenol for pain. Icing regimen advised. patient will let me know how she is doing

## 2019-05-04 NOTE — Progress Notes (Signed)
Verbal consent for services obtained from patient prior to services given.  Location of call:  provider at work patient at home  Names of all persons present for services: [name], NP Chief complaint:   Left shoulder pain x 5 weeks, worsening.  Describes pain in upper part of arm over biceps. No pain in elbow.  Cannot bring elbow to hair nor scratch back. Cannot sleep on left shoulder.    Fallen while going through door wearing slippers x 5 weeks ago. Fell with full force on left shoulder on hardwood floor.   No LOC, head injury. Wasn't on plavix at time of fall. No swelling, bruising, numbness. Describes as 'sore.'  Has prior prescription oxycodone and flexeril ; hasnt taken. Plans to use tylenol.    History, background, results pertinent:  H/o skin cancer S/p Lumbar fusion 2 weeks ago - Dr Izora Ribas. Walking now without pain. Pleased with surgery. At 3 visits with home PT.   Cannot take asa.   No alcohol.   A/P/next steps:  Problem List Items Addressed This Visit      Other   Left shoulder pain    Worsened over 5 weeks.  Based on conversation today, working diagnosis includes shoulder impingement, rotator cuff pathology.   Pending xrays. Has loop recorder so I did not order MRI at this time.  Would like to see  Dr Marlou Sa, orthopedic, if needs to be seen after Xrays result.  She will use Tylenol for pain. Icing regimen advised. patient will let me know how she is doing      Relevant Orders   DG Shoulder Left   DG Humerus Left       I spent 15 min  discussing plan of care over the phone.

## 2019-05-07 ENCOUNTER — Ambulatory Visit (INDEPENDENT_AMBULATORY_CARE_PROVIDER_SITE_OTHER): Payer: Medicare Other

## 2019-05-07 DIAGNOSIS — M25512 Pain in left shoulder: Secondary | ICD-10-CM | POA: Diagnosis not present

## 2019-05-07 DIAGNOSIS — M19012 Primary osteoarthritis, left shoulder: Secondary | ICD-10-CM | POA: Diagnosis not present

## 2019-05-07 NOTE — Progress Notes (Signed)
Printed and mailed

## 2019-05-08 NOTE — Progress Notes (Signed)
Carelink Summary Report / Loop Recorder 

## 2019-05-10 DIAGNOSIS — M4800 Spinal stenosis, site unspecified: Secondary | ICD-10-CM | POA: Diagnosis not present

## 2019-05-10 DIAGNOSIS — Z981 Arthrodesis status: Secondary | ICD-10-CM | POA: Diagnosis not present

## 2019-05-10 DIAGNOSIS — M5137 Other intervertebral disc degeneration, lumbosacral region: Secondary | ICD-10-CM | POA: Diagnosis not present

## 2019-05-11 ENCOUNTER — Encounter: Payer: Self-pay | Admitting: Family

## 2019-05-11 ENCOUNTER — Telehealth: Payer: Self-pay | Admitting: Family

## 2019-05-11 DIAGNOSIS — M25512 Pain in left shoulder: Secondary | ICD-10-CM

## 2019-05-11 MED ORDER — DICLOFENAC SODIUM 1 % TD GEL: 4.0000 g | Freq: Four times a day (QID) | TRANSDERMAL | 3 refills | Status: DC

## 2019-05-11 MED ORDER — LIDOCAINE 5 % EX PTCH: 1.0000 | MEDICATED_PATCH | CUTANEOUS | 0 refills | Status: DC

## 2019-05-11 NOTE — Telephone Encounter (Signed)
Spoke with patient   Would like to see Dr Marlou Sa whom she has seen past; declines further imaging of left humeral lesion until seen by Dr Marlou Sa.   No old left arm fracture  Low back pain resolved, no longer following with Dr Holley Raring.   On lyrica.   Cannot take NSAID, gabapentin or tramadol.  Advised to call Dr Randel Pigg office and schedule a follow up.   She will trial lidocaine patch, voltaren gel for pain and let me know how she is doing

## 2019-05-14 ENCOUNTER — Telehealth: Payer: Self-pay | Admitting: Family

## 2019-05-14 NOTE — Telephone Encounter (Signed)
Call pt  I heard back from orthopedic Dr Cheral Almas. He preferred to see you first to discuss shoulder pain and bone lesion  Please advise her to call and sch a f/u with him and let us know of any issues in doing so

## 2019-05-15 ENCOUNTER — Ambulatory Visit
Admission: RE | Admit: 2019-05-15 | Discharge: 2019-05-15 | Disposition: A | Discharge status: Home / Self Care | Payer: Medicare Other | Source: Ambulatory Visit | Attending: Family | Admitting: Family

## 2019-05-15 ENCOUNTER — Other Ambulatory Visit: Payer: Self-pay

## 2019-05-15 DIAGNOSIS — Z1231 Encounter for screening mammogram for malignant neoplasm of breast: Secondary | ICD-10-CM | POA: Diagnosis not present

## 2019-05-16 NOTE — Telephone Encounter (Signed)
noted 

## 2019-05-21 ENCOUNTER — Ambulatory Visit
Admission: RE | Admit: 2019-05-21 | Discharge: 2019-05-21 | Disposition: A | Discharge status: Home / Self Care | Payer: Medicare Other | Source: Ambulatory Visit | Attending: Internal Medicine | Admitting: Internal Medicine

## 2019-05-21 ENCOUNTER — Other Ambulatory Visit: Payer: Self-pay

## 2019-05-21 DIAGNOSIS — R918 Other nonspecific abnormal finding of lung field: Secondary | ICD-10-CM

## 2019-05-21 DIAGNOSIS — I700 Atherosclerosis of aorta: Secondary | ICD-10-CM | POA: Diagnosis not present

## 2019-05-21 DIAGNOSIS — I2510 Atherosclerotic heart disease of native coronary artery without angina pectoris: Secondary | ICD-10-CM | POA: Diagnosis not present

## 2019-05-23 ENCOUNTER — Encounter: Payer: Self-pay | Admitting: Orthopedic Surgery

## 2019-05-23 ENCOUNTER — Ambulatory Visit (INDEPENDENT_AMBULATORY_CARE_PROVIDER_SITE_OTHER): Payer: Medicare Other | Admitting: Orthopedic Surgery

## 2019-05-23 ENCOUNTER — Other Ambulatory Visit: Payer: Self-pay

## 2019-05-23 DIAGNOSIS — M12812 Other specific arthropathies, not elsewhere classified, left shoulder: Secondary | ICD-10-CM

## 2019-05-23 DIAGNOSIS — I639 Cerebral infarction, unspecified: Secondary | ICD-10-CM

## 2019-05-23 MED ORDER — LIDOCAINE HCL 1 % IJ SOLN
5.0000 mL | INTRAMUSCULAR | Status: AC | PRN
  Administered 2019-05-23: 5 mL

## 2019-05-23 MED ORDER — METHYLPREDNISOLONE ACETATE 40 MG/ML IJ SUSP
40.0000 mg | INTRAMUSCULAR | Status: AC | PRN
  Administered 2019-05-23: 40 mg via INTRA_ARTICULAR

## 2019-05-23 MED ORDER — BUPIVACAINE HCL 0.5 % IJ SOLN
9.0000 mL | INTRAMUSCULAR | Status: AC | PRN
  Administered 2019-05-23: 9 mL via INTRA_ARTICULAR

## 2019-05-23 NOTE — Progress Notes (Signed)
Office Visit Note   Patient: Melody Ochoa           Date of Birth: 1941-11-15           MRN: [id number] Visit Date: 05/23/2019 Requested by: Burnard Hawthorne, FNP [address] [place],  [place] [address] PCP: Burnard Hawthorne, FNP  Subjective: Chief Complaint  Patient presents with  . Left Shoulder - Pain    HPI: Melody Ochoa is a patient with left shoulder pain.  She had a fall in January of this year and she landed on her shoulder.  Just recently had back surgery in May and is doing well with that.  She is left-hand dominant and ambulates with a cane.  The pain in that left shoulder has gotten worse.  Reports pain and weakness in that left shoulder.  Denied having any bruising at the time of the injury.  She is not able to swim because of her back and the left shoulder.  In the past with her back she has had physical therapy which has not helped as well as epidural steroid injections which did not help her back.  She does bring that history to her interpretation of her shoulder problems.              ROS: All systems reviewed are negative as they relate to the chief complaint within the history of present illness.  Patient denies  fevers or chills.   Assessment & Plan: Visit Diagnoses:  1. Rotator cuff arthropathy of left shoulder     Plan: Impression is left shoulder pain and weakness with diminished forward flexion abduction both below 90 degrees and weakness to rotator cuff testing of the infraspinatus on exam.  She also has some narrowing of the acromiohumeral distance on radiographs.  Plan is injection into that left shoulder with 8-week return.  I will see her back at that time and I do want her to consider whether or not her symptoms are bad enough pain and function wise to merit invasive shoulder replacement surgery.  We can have that talk in 8 weeks.  Follow-Up Instructions: Return in about 8 weeks (around 07/18/2019).   Orders:  No orders of the defined types  were placed in this encounter.  No orders of the defined types were placed in this encounter.     Procedures: Large Joint Inj: L glenohumeral on 05/23/2019 3:04 PM Indications: diagnostic evaluation and pain Details: 18 G 1.5 in needle, posterior approach  Arthrogram: No  Medications: 9 mL bupivacaine 0.5 %; 40 mg methylPREDNISolone acetate 40 MG/ML; 5 mL lidocaine 1 % Outcome: tolerated well, no immediate complications Procedure, treatment alternatives, risks and benefits explained, specific risks discussed. Consent was given by the patient. Immediately prior to procedure a time out was called to verify the correct patient, procedure, equipment, support staff and site/side marked as required. Patient was prepped and draped in the usual sterile fashion.       Clinical Data: No additional findings.  Objective: Vital Signs: There were no vitals taken for this visit.  Physical Exam:   Constitutional: Patient appears well-developed HEENT:  Head: Normocephalic Eyes:EOM are normal Neck: Normal range of motion Cardiovascular: Normal rate Pulmonary/chest: Effort normal Neurologic: Patient is alert Skin: Skin is warm Psychiatric: Patient has normal mood and affect    Ortho Exam: Ortho exam demonstrates good cervical spine range of motion.  She has on that left shoulder weakness supraspinatus testing as well as forward flexion abduction both  below 90.  Subscap strength is intact on the left.  No discrete AC joint tenderness to direct palpation and no masses lymphadenopathy or skin changes noted in the left shoulder girdle region.  Specialty Comments:  No specialty comments available.  Imaging: No results found.   PMFS History: Patient Active Problem List   Diagnosis Date Noted  . Left shoulder pain 05/04/2019  . Anemia 03/19/2019  . Long term current use of opiate analgesic 01/09/2019  . Foraminal stenosis of lumbar region 11/14/2018  . Chronic pain syndrome 09/12/2018   . SOB (shortness of breath) on exertion 08/11/2018  . Dysphagia 08/11/2018  . Cough 06/30/2018  . Rash 01/16/2018  . Hyperlipidemia 10/18/2017  . History of lumbar surgery 10/04/2017  . Depression, recurrent ([place]) 09/27/2017  . Pulmonary nodules 09/27/2017  . History of stroke 09/12/2017  . Stroke (cerebrum) (HCC) - Patchy small volume acute ischemic nonhemorrhagic superior left cerebellar infarct, left SCA territory, of undetermined etiology, with diffuse atherosclerosis 08/10/2017  . Primary osteoarthritis of left hip 02/28/2017  . Strain of left hip 02/28/2017  . B12 deficiency 12/21/2016  . Generalized OA 05/13/2016  . Status post total replacement of right hip 04/22/2016  . Personal history of colonic polyps   . Greater trochanteric bursitis of both hips 01/09/2016  . Prediabetes 08/04/2015  . Thoracic aortic atherosclerosis ([place]) 06/16/2015  . 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  . UTI (lower urinary tract infection) 11/22/2014  . Low back pain 11/22/2014  . Spondylolisthesis of lumbar region 11/18/2014  . Lumbar radiculopathy 08/21/2014  . Degeneration of lumbar or lumbosacral intervertebral disc 08/21/2014  . Left hip pain 08/21/2014  . Osteopenia 06/23/2014  . Major depression in remission ([place]) 06/23/2014  . Anxiety 06/23/2014  . Menopause 06/23/2014   Past Medical History:  Diagnosis Date  . Arthritis   . Atheromatous plaque 10/18/2017  . Back pain   . Cancer ([place])    skin  . Depression   . Diverticulosis   . Dizziness    patient had episode of dizziness when came in room. hx past no dx  . GERD (gastroesophageal reflux disease)   . Heart murmur   . Hyperlipidemia 10/18/2017  . PONV (postoperative nausea and vomiting)    yrs ago  . Stroke [facility])     Family History  Problem Relation Age of Onset  . Lung cancer Father   . Aneurysm Brother   . Stroke Brother   . Diabetes Maternal Grandmother   . Kidney disease Maternal Grandfather   . Cushing  syndrome Paternal Grandmother   . Dementia Paternal Grandfather   . Breast cancer Maternal Aunt 52    Past Surgical History:  Procedure Laterality Date  . BACK SURGERY  13  . BREAST BIOPSY Bilateral    cores "years ago"  . BREAST EXCISIONAL BIOPSY Left 90's  . CATARACT EXTRACTION W/PHACO Left 12/20/2018   Procedure: CATARACT EXTRACTION PHACO AND INTRAOCULAR LENS PLACEMENT ([place]) LEFT TOPICAL;  Surgeon: Leandrew Koyanagi, MD;  Location: [facility];  Service: Ophthalmology;  Laterality: Left;  . CATARACT EXTRACTION W/PHACO Right 01/24/2019   Procedure: CATARACT EXTRACTION PHACO AND INTRAOCULAR LENS PLACEMENT ([place]) RIGHT;  Surgeon: Leandrew Koyanagi, MD;  Location: [facility];  Service: Ophthalmology;  Laterality: Right;  . CERVICAL DISC SURGERY  09  . COLONOSCOPY WITH PROPOFOL N/A 01/13/2016   Procedure: COLONOSCOPY WITH PROPOFOL;  Surgeon: [name], MD;  Location: [facility] ENDOSCOPY;  Service: Endoscopy;  Laterality: N/A;  . FACELIFT  94  . LOOP  RECORDER INSERTION N/A 08/23/2017   Procedure: LOOP RECORDER INSERTION;  Surgeon: Thompson Grayer, MD;  Location: [facility] CV LAB;  Service: Cardiovascular;  Laterality: N/A;  . TEE WITHOUT CARDIOVERSION N/A 08/12/2017   Procedure: TRANSESOPHAGEAL ECHOCARDIOGRAM (TEE);  Surgeon: Sueanne Margarita, MD;  Location[facility] ENDOSCOPY;  Service: Cardiovascular;  Laterality: N/A;  . TONSILLECTOMY    . TOTAL HIP ARTHROPLASTY Right 04/22/2016   Procedure: TOTAL HIP ARTHROPLASTY;  Surgeon: Corky Mull, MD;  Location: [facility] ORS;  Service: Orthopedics;  Laterality: Right;   Social History   Occupational History  . Not on file  Tobacco Use  . Smoking status: Former Smoker    Packs/day: 1.00    Years: 40.00    Pack years: 40.00    Types: Cigarettes    Quit date: 11/13/1996    Years since quitting: 22.5  . Smokeless tobacco: Never Used  Substance and Sexual Activity  . Alcohol use: Yes    Comment: occ wine  . Drug use: No  . Sexual  activity: Not Currently

## 2019-05-30 ENCOUNTER — Ambulatory Visit (INDEPENDENT_AMBULATORY_CARE_PROVIDER_SITE_OTHER): Payer: Medicare Other | Admitting: *Deleted

## 2019-05-30 ENCOUNTER — Telehealth: Payer: Self-pay | Admitting: Internal Medicine

## 2019-05-30 DIAGNOSIS — I639 Cerebral infarction, unspecified: Secondary | ICD-10-CM | POA: Diagnosis not present

## 2019-05-30 NOTE — Progress Notes (Signed)
[facility] Consultation     Virtual Visit via Telephone Note I connected with patient on 05/31/19 at 11:30 AM EDT by telephone and verified that I am speaking with the correct person using two identifiers.   I discussed the limitations, risks of performing an evaluation and management service by telephone and the availability of in person appointments. I also discussed with the patient that there may be a patient responsible charge related to this service.  In light of current covid-19 pandemic, patient also understands that we are trying to protect them by minimizing in office contact if at all possible.  The patient expressed understanding and agreed to proceed. Please see note below for further detail.    The patient was advised to call back or seek an in-person evaluation if the symptoms worsen or if the condition fails to improve as anticipated.   Laverle Hobby, MD   Assessment and Plan:  Lung nodule. - Small groundglass nodule in left upper lobe, initially seen September 2018. - Continue surveillance for 3 to 5 years total for ground glass lung nodule.  Repeat CT chest ordered after last visit.  Can repeat CT chest in 1 year.   Pulmonary fibrosis. -Right upper lobe honeycomb changing with traction bronchiectasis, which does not appear typical for UIP, however the patient does have bibasilar subpleural reticular changes which do appear suggestive of UIP. - Currently the patient does not appear to be symptomatic, will continue to monitor. --She continues to have some swelling of the distal interphalangeal joints on her fingers, give RF and ACE.   Dyspnea on exertion. -Patient has mild dyspnea on exertion, she is currently on Spiriva and Ventolin, and is wondering if she needs to take them. - I have asked her to try stopping the Spiriva and if she notes no difference then remain off of it.  She can continue Ventolin once or twice a day.    Return in  about 6 months (around 11/30/2019).     Date: 05/30/2019  MRN# [id number] Melody Ochoa 1941/06/14   Melody Ochoa is a 78 y.o. old female seen in consultation for chief complaint of:    No chief complaint on file.   HPI:   Melody Ochoa 78 y.o. female  With a history of chronic bronchitis, cough.  She had previously seen Dr. Raul Del.  She is followed here for history of a groundglass left upper lobe nodule.  She was asked to repeat the CT chest in about 9 months. At that time it was noted that she had basilar scarring, and elevated right diaphragm. Since her last visit she feels that her breathing has been feeling ok. She feels that her breathing has continued to feel well. She is taking spiriva once daily and not sure that it is helping. She is taking ventolin twice per day and feels that it helps.   She comes in today because she has throat congestion and she has cataract surgery next week. She is using albuterol inhaler a few times per day. She is on no other inhalers.  She has a mild cough of greenish sputum.   **Serology 08/25/2018>> rheumatoid factor, ACE negative. **CT chest 05/21/2019>> imaging personally reviewed, in comparison with previous on 08/21/2018, there are bibasilar's subpleural fibrotic changes which are also present in the right upper lobe.  Left upper lobe nodule is unchanged in comparison with previous. **CT chest 08/21/2018>> images personally reviewed,  in comparison with previous on 08/19/2017; there is an  unchanged left upper lobe honeycomb fibrosis peripherally when compared to previous, per radiology report this may be slightly larger than previous.  Some extends medially with traction bronchiectasis, mild subpleural interstitial changes bilaterally.  Elevated right diaphragm.    Medication:    Current Outpatient Medications:  .  atorvastatin (LIPITOR) 80 MG tablet, TAKE 1 TABLET (80 MG TOTAL) BY MOUTH DAILY AT 6 PM. MUST SCHEDUALE APPT. FOR FURTHER  REFILLS., Disp: 90 tablet, Rfl: 0 .  buPROPion (WELLBUTRIN XL) 300 MG 24 hr tablet, Take 300 mg by mouth daily. , Disp: , Rfl:  .  Calcium Carbonate-Vitamin D (CALTRATE 600+D PO), Take 1 tablet by mouth daily., Disp: , Rfl:  .  clopidogrel (PLAVIX) 75 MG tablet, TAKE 1 TABLET BY MOUTH EVERY DAY, Disp: 30 tablet, Rfl: 11 .  diclofenac sodium (VOLTAREN) 1 % GEL, Apply 4 g topically 4 (four) times daily., Disp: 1 Tube, Rfl: 3 .  DULoxetine (CYMBALTA) 60 MG capsule, Take 60 mg by mouth 2 (two) times daily. , Disp: , Rfl:  .  famotidine (PEPCID) 20 MG tablet, Take 20 mg by mouth at bedtime. , Disp: , Rfl: 12 .  ferrous sulfate 325 (65 FE) MG tablet, Take 1 tablet (325 mg total) by mouth daily., Disp: 30 tablet, Rfl: 0 .  fluticasone (FLONASE) 50 MCG/ACT nasal spray, Place 1 spray into both nostrils daily as needed for allergies or rhinitis (congestion). , Disp: , Rfl:  .  lidocaine (LIDODERM) 5 %, Place 1 patch onto the skin daily. Remove & Discard patch within 12 hours., Disp: 30 patch, Rfl: 0 .  NON FORMULARY, Place 1 drop into the right eye daily. (Prednisolone Acetate/Gatifloxacin), Disp: , Rfl:  .  pregabalin (LYRICA) 50 MG capsule, Take 1 capsule (50 mg total) by mouth 3 (three) times daily., Disp: 90 capsule, Rfl: 2 .  tiotropium (SPIRIVA) 18 MCG inhalation capsule, Place 1 capsule (18 mcg total) into inhaler and inhale daily for 30 days., Disp: 30 capsule, Rfl: 12 .  triamcinolone cream (KENALOG) 0.1 %, Apply 1 application topically 2 (two) times daily as needed (rash/skin irritation.). , Disp: , Rfl: 0 .  VENTOLIN HFA 108 (90 Base) MCG/ACT inhaler, TAKE 2 PUFFS BY MOUTH EVERY 6 HOURS AS NEEDED FOR WHEEZE OR SHORTNESS OF BREATH, Disp: 18 Inhaler, Rfl: 0   Allergies:  Aleve [naproxen sodium], Aspirin, Celebrex [celecoxib], Effexor [venlafaxine], Gabapentin, Hydrocodone-homatropine, Ibuprofen, Tape, Vioxx [rofecoxib], Other, and Tramadol    Review of Systems:  Constitutional: Feels well.  Cardiovascular: Denies chest pain, exertional chest pain.  Pulmonary: Denies hemoptysis, pleuritic chest pain.   The remainder of systems were reviewed and were found to be negative other than what is documented in the HPI.      LABORATORY PANEL:   CBC No results for input(s): WBC, HGB, HCT, PLT in the last 168 hours. ------------------------------------------------------------------------------------------------------------------  Chemistries  No results for input(s): NA, K, CL, CO2, GLUCOSE, BUN, CREATININE, CALCIUM, MG, AST, ALT, ALKPHOS, BILITOT in the last 168 hours.  Invalid input(s): GFRCGP ------------------------------------------------------------------------------------------------------------------  Cardiac Enzymes No results for input(s): TROPONINI in the last 168 hours. ------------------------------------------------------------  RADIOLOGY:  No results found.     Thank  you for the consultation and for allowing [facility] to assist in the care of your patient. Our recommendations are noted above.  Please contact us if we can be of further service.  Marda Stalker, M.D., F.C.C.P.  Board Certified in Internal Medicine, Pulmonary Medicine, [place], and Sleep Medicine.  [facility] Office Number: [phone]  05/30/2019

## 2019-05-30 NOTE — Telephone Encounter (Signed)
Called patient for COVID-19 pre-screening for in office visit.  Have you recently traveled any where out of the local area in the last 2 weeks? NO  Have you been in close contact with a person diagnosed with COVID-19 within the last 2 weeks? NO  Do you currently have any of the following symptoms? If so, when did they start? Cough     Diarrhea   Joint Pain Fever      Muscle Pain   Red eyes Shortness of breath (Yes- over a year)    Abdominal painVomiting Loss of smell    Rash    Sore Throat Headache    Weakness   Bruising or bleeding   Okay to proceed with visit 05/31/2019

## 2019-05-31 ENCOUNTER — Ambulatory Visit (INDEPENDENT_AMBULATORY_CARE_PROVIDER_SITE_OTHER): Payer: Medicare Other | Admitting: Internal Medicine

## 2019-05-31 DIAGNOSIS — J8410 Pulmonary fibrosis, unspecified: Secondary | ICD-10-CM | POA: Diagnosis not present

## 2019-05-31 DIAGNOSIS — R0609 Other forms of dyspnea: Secondary | ICD-10-CM | POA: Diagnosis not present

## 2019-05-31 DIAGNOSIS — I639 Cerebral infarction, unspecified: Secondary | ICD-10-CM | POA: Diagnosis not present

## 2019-05-31 DIAGNOSIS — R911 Solitary pulmonary nodule: Secondary | ICD-10-CM

## 2019-05-31 DIAGNOSIS — R0600 Dyspnea, unspecified: Secondary | ICD-10-CM

## 2019-05-31 LAB — CUP PACEART REMOTE DEVICE CHECK
Date Time Interrogation Session: 20200625000812
Implantable Pulse Generator Implant Date: 20180918

## 2019-05-31 NOTE — Patient Instructions (Addendum)
Can stop spiriva if you don't notice a difference you can stay off of it.  Continue using ventolin about twice per day.

## 2019-06-02 ENCOUNTER — Other Ambulatory Visit: Payer: Self-pay | Admitting: Student in an Organized Health Care Education/Training Program

## 2019-06-07 DIAGNOSIS — B078 Other viral warts: Secondary | ICD-10-CM | POA: Diagnosis not present

## 2019-06-11 ENCOUNTER — Other Ambulatory Visit: Payer: Self-pay | Admitting: Family

## 2019-06-11 DIAGNOSIS — B351 Tinea unguium: Secondary | ICD-10-CM | POA: Diagnosis not present

## 2019-06-11 DIAGNOSIS — J8410 Pulmonary fibrosis, unspecified: Secondary | ICD-10-CM

## 2019-06-11 DIAGNOSIS — R05 Cough: Secondary | ICD-10-CM

## 2019-06-11 DIAGNOSIS — R058 Other specified cough: Secondary | ICD-10-CM

## 2019-06-11 NOTE — Progress Notes (Signed)
Carelink Summary Report / Loop Recorder 

## 2019-06-12 ENCOUNTER — Encounter: Payer: Self-pay | Admitting: Family

## 2019-06-13 ENCOUNTER — Inpatient Hospital Stay: Payer: Medicare Other

## 2019-06-13 ENCOUNTER — Inpatient Hospital Stay
Admission: EM | Admit: 2019-06-13 | Discharge: 2019-06-15 | DRG: 066 | Disposition: A | Discharge status: Home Health | Payer: Medicare Other | Attending: Internal Medicine | Admitting: Internal Medicine

## 2019-06-13 ENCOUNTER — Emergency Department: Payer: Medicare Other

## 2019-06-13 ENCOUNTER — Inpatient Hospital Stay (HOSPITAL_COMMUNITY)
Admit: 2019-06-13 | Discharge: 2019-06-13 | Disposition: A | Discharge status: Home / Self Care | Payer: Medicare Other | Attending: Nurse Practitioner | Admitting: Nurse Practitioner

## 2019-06-13 ENCOUNTER — Other Ambulatory Visit: Payer: Self-pay

## 2019-06-13 ENCOUNTER — Encounter: Payer: Self-pay | Admitting: Emergency Medicine

## 2019-06-13 DIAGNOSIS — Z823 Family history of stroke: Secondary | ICD-10-CM | POA: Diagnosis not present

## 2019-06-13 DIAGNOSIS — M1612 Unilateral primary osteoarthritis, left hip: Secondary | ICD-10-CM | POA: Diagnosis present

## 2019-06-13 DIAGNOSIS — M48061 Spinal stenosis, lumbar region without neurogenic claudication: Secondary | ICD-10-CM | POA: Diagnosis present

## 2019-06-13 DIAGNOSIS — Z95818 Presence of other cardiac implants and grafts: Secondary | ICD-10-CM | POA: Diagnosis not present

## 2019-06-13 DIAGNOSIS — I6523 Occlusion and stenosis of bilateral carotid arteries: Secondary | ICD-10-CM | POA: Diagnosis not present

## 2019-06-13 DIAGNOSIS — R4781 Slurred speech: Secondary | ICD-10-CM | POA: Diagnosis present

## 2019-06-13 DIAGNOSIS — Z885 Allergy status to narcotic agent status: Secondary | ICD-10-CM

## 2019-06-13 DIAGNOSIS — Z79899 Other long term (current) drug therapy: Secondary | ICD-10-CM

## 2019-06-13 DIAGNOSIS — R4701 Aphasia: Secondary | ICD-10-CM

## 2019-06-13 DIAGNOSIS — M4316 Spondylolisthesis, lumbar region: Secondary | ICD-10-CM | POA: Diagnosis present

## 2019-06-13 DIAGNOSIS — Z87891 Personal history of nicotine dependence: Secondary | ICD-10-CM | POA: Diagnosis not present

## 2019-06-13 DIAGNOSIS — R4182 Altered mental status, unspecified: Secondary | ICD-10-CM | POA: Diagnosis not present

## 2019-06-13 DIAGNOSIS — Z7902 Long term (current) use of antithrombotics/antiplatelets: Secondary | ICD-10-CM | POA: Diagnosis not present

## 2019-06-13 DIAGNOSIS — Z8673 Personal history of transient ischemic attack (TIA), and cerebral infarction without residual deficits: Secondary | ICD-10-CM | POA: Diagnosis not present

## 2019-06-13 DIAGNOSIS — Z03818 Encounter for observation for suspected exposure to other biological agents ruled out: Secondary | ICD-10-CM | POA: Diagnosis not present

## 2019-06-13 DIAGNOSIS — Z791 Long term (current) use of non-steroidal anti-inflammatories (NSAID): Secondary | ICD-10-CM

## 2019-06-13 DIAGNOSIS — R42 Dizziness and giddiness: Secondary | ICD-10-CM | POA: Diagnosis not present

## 2019-06-13 DIAGNOSIS — R29810 Facial weakness: Secondary | ICD-10-CM | POA: Diagnosis present

## 2019-06-13 DIAGNOSIS — I340 Nonrheumatic mitral (valve) insufficiency: Secondary | ICD-10-CM | POA: Diagnosis not present

## 2019-06-13 DIAGNOSIS — Z8249 Family history of ischemic heart disease and other diseases of the circulatory system: Secondary | ICD-10-CM | POA: Diagnosis not present

## 2019-06-13 DIAGNOSIS — M5116 Intervertebral disc disorders with radiculopathy, lumbar region: Secondary | ICD-10-CM | POA: Diagnosis present

## 2019-06-13 DIAGNOSIS — J8410 Pulmonary fibrosis, unspecified: Secondary | ICD-10-CM | POA: Diagnosis present

## 2019-06-13 DIAGNOSIS — M8580 Other specified disorders of bone density and structure, unspecified site: Secondary | ICD-10-CM | POA: Diagnosis present

## 2019-06-13 DIAGNOSIS — K219 Gastro-esophageal reflux disease without esophagitis: Secondary | ICD-10-CM | POA: Diagnosis present

## 2019-06-13 DIAGNOSIS — Z7951 Long term (current) use of inhaled steroids: Secondary | ICD-10-CM

## 2019-06-13 DIAGNOSIS — F329 Major depressive disorder, single episode, unspecified: Secondary | ICD-10-CM | POA: Diagnosis present

## 2019-06-13 DIAGNOSIS — I700 Atherosclerosis of aorta: Secondary | ICD-10-CM | POA: Diagnosis present

## 2019-06-13 DIAGNOSIS — Z20828 Contact with and (suspected) exposure to other viral communicable diseases: Secondary | ICD-10-CM | POA: Diagnosis present

## 2019-06-13 DIAGNOSIS — G894 Chronic pain syndrome: Secondary | ICD-10-CM | POA: Diagnosis present

## 2019-06-13 DIAGNOSIS — I639 Cerebral infarction, unspecified: Secondary | ICD-10-CM | POA: Diagnosis not present

## 2019-06-13 DIAGNOSIS — R262 Difficulty in walking, not elsewhere classified: Secondary | ICD-10-CM | POA: Diagnosis present

## 2019-06-13 DIAGNOSIS — R29700 NIHSS score 0: Secondary | ICD-10-CM | POA: Diagnosis present

## 2019-06-13 DIAGNOSIS — Z886 Allergy status to analgesic agent status: Secondary | ICD-10-CM

## 2019-06-13 DIAGNOSIS — Z888 Allergy status to other drugs, medicaments and biological substances status: Secondary | ICD-10-CM

## 2019-06-13 DIAGNOSIS — G459 Transient cerebral ischemic attack, unspecified: Secondary | ICD-10-CM | POA: Diagnosis not present

## 2019-06-13 DIAGNOSIS — E785 Hyperlipidemia, unspecified: Secondary | ICD-10-CM | POA: Diagnosis present

## 2019-06-13 DIAGNOSIS — Z91048 Other nonmedicinal substance allergy status: Secondary | ICD-10-CM

## 2019-06-13 LAB — URINE DRUG SCREEN, QUALITATIVE (ARMC ONLY)
Amphetamines, Ur Screen: NOT DETECTED
Barbiturates, Ur Screen: NOT DETECTED
Benzodiazepine, Ur Scrn: NOT DETECTED
Cannabinoid 50 Ng, Ur ~~LOC~~: NOT DETECTED
Cocaine Metabolite,Ur ~~LOC~~: NOT DETECTED
MDMA (Ecstasy)Ur Screen: NOT DETECTED
Methadone Scn, Ur: NOT DETECTED
Opiate, Ur Screen: NOT DETECTED
Phencyclidine (PCP) Ur S: NOT DETECTED
Tricyclic, Ur Screen: NOT DETECTED

## 2019-06-13 LAB — DIFFERENTIAL
Abs Immature Granulocytes: 0.02 10*3/uL (ref 0.00–0.07)
Basophils Absolute: 0.1 10*3/uL (ref 0.0–0.1)
Basophils Relative: 1 %
Eosinophils Absolute: 0.3 10*3/uL (ref 0.0–0.5)
Eosinophils Relative: 5 %
Immature Granulocytes: 0 %
Lymphocytes Relative: 16 %
Lymphs Abs: 1.1 10*3/uL (ref 0.7–4.0)
Monocytes Absolute: 0.6 10*3/uL (ref 0.1–1.0)
Monocytes Relative: 9 %
Neutro Abs: 4.5 10*3/uL (ref 1.7–7.7)
Neutrophils Relative %: 69 %

## 2019-06-13 LAB — CBC
HCT: 31 % — ABNORMAL LOW (ref 36.0–46.0)
Hemoglobin: 9.9 g/dL — ABNORMAL LOW (ref 12.0–15.0)
MCH: 27.7 pg (ref 26.0–34.0)
MCHC: 31.9 g/dL (ref 30.0–36.0)
MCV: 86.6 fL (ref 80.0–100.0)
Platelets: 209 10*3/uL (ref 150–400)
RBC: 3.58 MIL/uL — ABNORMAL LOW (ref 3.87–5.11)
RDW: 14.5 % (ref 11.5–15.5)
WBC: 6.5 10*3/uL (ref 4.0–10.5)
nRBC: 0 % (ref 0.0–0.2)

## 2019-06-13 LAB — PROTIME-INR
INR: 1 (ref 0.8–1.2)
Prothrombin Time: 12.9 seconds (ref 11.4–15.2)

## 2019-06-13 LAB — URINALYSIS, COMPLETE (UACMP) WITH MICROSCOPIC
Bacteria, UA: NONE SEEN
Bilirubin Urine: NEGATIVE
Glucose, UA: NEGATIVE mg/dL
Hgb urine dipstick: NEGATIVE
Ketones, ur: NEGATIVE mg/dL
Leukocytes,Ua: NEGATIVE
Nitrite: NEGATIVE
Protein, ur: NEGATIVE mg/dL
Specific Gravity, Urine: 1.013 (ref 1.005–1.030)
pH: 7 (ref 5.0–8.0)

## 2019-06-13 LAB — LIPID PANEL
Cholesterol: 128 mg/dL (ref 0–200)
HDL: 63 mg/dL (ref >40)
LDL Cholesterol: 57 mg/dL (ref 0–99)
Total CHOL/HDL Ratio: 2 RATIO
Triglycerides: 40 mg/dL (ref <150)
VLDL: 8 mg/dL (ref 0–40)

## 2019-06-13 LAB — APTT: aPTT: 31 seconds (ref 24–36)

## 2019-06-13 LAB — COMPREHENSIVE METABOLIC PANEL
ALT: 33 U/L (ref 0–44)
AST: 30 U/L (ref 15–41)
Albumin: 4 g/dL (ref 3.5–5.0)
Alkaline Phosphatase: 65 U/L (ref 38–126)
Anion gap: 9 (ref 5–15)
BUN: 16 mg/dL (ref 8–23)
CO2: 23 mmol/L (ref 22–32)
Calcium: 9.1 mg/dL (ref 8.9–10.3)
Chloride: 104 mmol/L (ref 98–111)
Creatinine, Ser: 1 mg/dL (ref 0.44–1.00)
GFR calc Af Amer: >60 mL/min (ref >60)
GFR calc non Af Amer: 54 mL/min — ABNORMAL LOW (ref >60)
Glucose, Bld: 101 mg/dL — ABNORMAL HIGH (ref 70–99)
Potassium: 4.3 mmol/L (ref 3.5–5.1)
Sodium: 136 mmol/L (ref 135–145)
Total Bilirubin: 0.3 mg/dL (ref 0.3–1.2)
Total Protein: 7 g/dL (ref 6.5–8.1)

## 2019-06-13 LAB — GLUCOSE, CAPILLARY: Glucose-Capillary: 107 mg/dL — ABNORMAL HIGH (ref 70–99)

## 2019-06-13 LAB — SARS CORONAVIRUS 2 BY RT PCR (HOSPITAL ORDER, PERFORMED IN ~~LOC~~ HOSPITAL LAB): SARS Coronavirus 2: NEGATIVE

## 2019-06-13 MED ORDER — TIOTROPIUM BROMIDE MONOHYDRATE 18 MCG IN CAPS: 18.0000 ug | ORAL_CAPSULE | Freq: Every day | RESPIRATORY_TRACT | Status: DC

## 2019-06-13 MED ORDER — SODIUM CHLORIDE 0.9 % IV SOLN
INTRAVENOUS | Status: DC
  Administered 2019-06-13 – 2019-06-15 (×4): via INTRAVENOUS

## 2019-06-13 MED ORDER — BUPROPION HCL ER (XL) 150 MG PO TB24
300.0000 mg | ORAL_TABLET | Freq: Every day | ORAL | Status: DC
  Administered 2019-06-13 – 2019-06-15 (×3): 300 mg via ORAL
  Filled 2019-06-13 (×3): qty 2

## 2019-06-13 MED ORDER — CLOPIDOGREL BISULFATE 75 MG PO TABS
75.0000 mg | ORAL_TABLET | Freq: Every day | ORAL | Status: DC
  Administered 2019-06-14 – 2019-06-15 (×2): 75 mg via ORAL
  Filled 2019-06-13 (×2): qty 1

## 2019-06-13 MED ORDER — ACETAMINOPHEN 160 MG/5ML PO SOLN
650.0000 mg | ORAL | Status: DC | PRN
  Filled 2019-06-13: qty 20.3

## 2019-06-13 MED ORDER — FAMOTIDINE 20 MG PO TABS
20.0000 mg | ORAL_TABLET | Freq: Every day | ORAL | Status: DC
  Administered 2019-06-13 – 2019-06-14 (×2): 20 mg via ORAL
  Filled 2019-06-13 (×2): qty 1

## 2019-06-13 MED ORDER — FLUTICASONE PROPIONATE 50 MCG/ACT NA SUSP
1.0000 | Freq: Every day | NASAL | Status: DC | PRN
  Filled 2019-06-13: qty 16

## 2019-06-13 MED ORDER — ALBUTEROL SULFATE (2.5 MG/3ML) 0.083% IN NEBU
2.5000 mg | INHALATION_SOLUTION | Freq: Four times a day (QID) | RESPIRATORY_TRACT | Status: DC
  Filled 2019-06-13: qty 3

## 2019-06-13 MED ORDER — PREGABALIN 50 MG PO CAPS
50.0000 mg | ORAL_CAPSULE | Freq: Three times a day (TID) | ORAL | Status: DC
  Administered 2019-06-13 – 2019-06-15 (×6): 50 mg via ORAL
  Filled 2019-06-13 (×6): qty 1

## 2019-06-13 MED ORDER — ENOXAPARIN SODIUM 40 MG/0.4ML ~~LOC~~ SOLN
40.0000 mg | SUBCUTANEOUS | Status: DC
  Administered 2019-06-13 – 2019-06-14 (×2): 40 mg via SUBCUTANEOUS
  Filled 2019-06-13 (×2): qty 0.4

## 2019-06-13 MED ORDER — SENNOSIDES-DOCUSATE SODIUM 8.6-50 MG PO TABS: 1.0000 | ORAL_TABLET | Freq: Every evening | ORAL | Status: DC | PRN

## 2019-06-13 MED ORDER — LIDOCAINE 5 % EX PTCH
1.0000 | MEDICATED_PATCH | CUTANEOUS | Status: DC
  Filled 2019-06-13 (×2): qty 1

## 2019-06-13 MED ORDER — FERROUS SULFATE 325 (65 FE) MG PO TABS
325.0000 mg | ORAL_TABLET | Freq: Every day | ORAL | Status: DC
  Administered 2019-06-14 – 2019-06-15 (×2): 325 mg via ORAL
  Filled 2019-06-13 (×2): qty 1

## 2019-06-13 MED ORDER — ACETAMINOPHEN 650 MG RE SUPP: 650.0000 mg | RECTAL | Status: DC | PRN

## 2019-06-13 MED ORDER — ALBUTEROL SULFATE (2.5 MG/3ML) 0.083% IN NEBU: 2.5000 mg | INHALATION_SOLUTION | Freq: Four times a day (QID) | RESPIRATORY_TRACT | Status: DC | PRN

## 2019-06-13 MED ORDER — STROKE: EARLY STAGES OF RECOVERY BOOK
Freq: Once | Status: AC
  Administered 2019-06-13: 17:00:00

## 2019-06-13 MED ORDER — ACETAMINOPHEN 325 MG PO TABS
650.0000 mg | ORAL_TABLET | ORAL | Status: DC | PRN
  Administered 2019-06-14: 07:00:00 650 mg via ORAL
  Filled 2019-06-13: qty 2

## 2019-06-13 MED ORDER — ATORVASTATIN CALCIUM 20 MG PO TABS
80.0000 mg | ORAL_TABLET | Freq: Every day | ORAL | Status: DC
  Administered 2019-06-13 – 2019-06-14 (×2): 80 mg via ORAL
  Filled 2019-06-13 (×2): qty 4

## 2019-06-13 MED ORDER — DULOXETINE HCL 30 MG PO CPEP
60.0000 mg | ORAL_CAPSULE | Freq: Two times a day (BID) | ORAL | Status: DC
  Administered 2019-06-13 – 2019-06-15 (×4): 60 mg via ORAL
  Filled 2019-06-13 (×4): qty 2

## 2019-06-13 MED ORDER — TRIAMCINOLONE ACETONIDE 0.1 % EX CREA
1.0000 "application " | TOPICAL_CREAM | Freq: Four times a day (QID) | CUTANEOUS | Status: DC
  Administered 2019-06-13: 1 via TOPICAL
  Filled 2019-06-13: qty 15

## 2019-06-13 MED ORDER — IPRATROPIUM-ALBUTEROL 0.5-2.5 (3) MG/3ML IN SOLN: 3.0000 mL | Freq: Four times a day (QID) | RESPIRATORY_TRACT | Status: DC

## 2019-06-13 NOTE — ED Notes (Signed)
Pt souse took belongings and purse home with him at this time

## 2019-06-13 NOTE — Evaluation (Signed)
Physical Therapy Evaluation Patient Details Name: Melody Ochoa MRN: [id number] DOB: May 24, 1941 Today's Date: 06/13/2019   History of Present Illness  From MD H&P: Pt is a 78 y.o. female with pertinent past medical history of normocytic anemia, chronic pain syndrome, hyperlipidemia, CVA s/p tPA (9/18), B12 deficiency, lumbar DDD, depression and pulmonary fibrosis presenting to the ED with chief complaints of word finding difficulty, slurred speech and being "off balance".  Noncontrast CT head was obtained and showed no acute intracranial abnormality.  Labs unremarkable.   Assessment includes: Speech disturbances - concerns for TIA/Ischemic event, HLD, chronic pain syndrome, pulmonary fibrosis, and depression.    Clinical Impression  Pt presents with deficits in strength, transfers, mobility, gait, balance, and activity tolerance.  Pt reports feeling close to back to baseline with only slight word finding difficulty remaining.  Pt presented with general BLE weakness but equal left/right.  Sensation to light touch and proprioception grossly intact and equal to BLEs.  Pt motivated to participate during the session and followed commands well.  No significant word finding deficits noted during the session.  Pt was Mod Ind with bed mobility tasks.  Pt required min A to stand when verbal cues for sequencing were not provided but only CGA with cues.  Static and dynamic balance training provided per below with pt frequently incorporating ankle strategies to prevent LOB and occasionally needing min A for stability.  Pt most notably had difficulty with trunk rotation during dynamic standing balance training equal left and right.  Will make pt frequency 2x/wk while in acute care but will adjust frequency as appropriate if further imaging is positive for CVA.   Pt will benefit from OPPT services upon discharge to safely address above deficits for decreased caregiver assistance and decreased risk of falls.         Follow Up Recommendations Other (comment);Outpatient PT(MD provided pt with prescription for OPPT at [facility])    Equipment Recommendations  None recommended by PT    Recommendations for Other Services       Precautions / Restrictions Precautions Precautions: Fall Restrictions Weight Bearing Restrictions: No      Mobility  Bed Mobility Overal bed mobility: Modified Independent             General bed mobility comments: Extra time and effort during sup to sit but no physical assistance needed  Transfers Overall transfer level: Needs assistance Equipment used: Rolling walker (2 wheeled) Transfers: Sit to/from Stand Sit to Stand: Min guard;Min assist         General transfer comment: Min A without verbal cues for sequencing, CGA with verbal cues for increased trunk flexion and hand placement  Ambulation/Gait Ambulation/Gait assistance: Min guard Gait Distance (Feet): 80 Feet Assistive device: Rolling walker (2 wheeled) Gait Pattern/deviations: Step-through pattern;Decreased step length - right;Decreased step length - left;Drifts right/left Gait velocity: decreased   General Gait Details: Slow cadence and short B step length with amb with mild drifting Left/Right but no LOB; SpO2 and HR WNL during the session  Stairs            Wheelchair Mobility    Modified Rankin (Stroke Patients Only)       Balance Overall balance assessment: Needs assistance Sitting-balance support: Feet supported;Single extremity supported Sitting balance-Leahy Scale: Good     Standing balance support: Bilateral upper extremity supported;During functional activity Standing balance-Leahy Scale: Fair  Pertinent Vitals/Pain Pain Assessment: No/denies pain    Home Living Family/patient expects to be discharged to:: Private residence Living Arrangements: Spouse/significant other Available Help at  Discharge: Family;Available 24 hours/day Type of Home: House Home Access: Stairs to enter Entrance Stairs-Rails: Can reach both;Left;Right Entrance Stairs-Number of Steps: 4 Home Layout: One level Home Equipment: Walker - 2 wheels;Cane - single point;Bedside commode;Shower seat - built in      Prior Function Level of Independence: Independent with assistive device(s)         Comments: Mod Ind with amb limited community distances with a SPC, no fall history, Ind with ADLs     Hand Dominance   Dominant Hand: Right    Extremity/Trunk Assessment   Upper Extremity Assessment Upper Extremity Assessment: Defer to OT evaluation    Lower Extremity Assessment Lower Extremity Assessment: Generalized weakness;RLE deficits/detail;LLE deficits/detail RLE Sensation: WNL RLE Coordination: WNL LLE Sensation: WNL LLE Coordination: WNL       Communication   Communication: No difficulties  Cognition Arousal/Alertness: Awake/alert Behavior During Therapy: WFL for tasks assessed/performed Overall Cognitive Status: Within Functional Limits for tasks assessed                                        General Comments      Exercises Other Exercises Other Exercises: Static balance training without UE support with feet apart, together, and semi-tandem Other Exercises: Dynamic standing balance training with feet apart and in semi-tandem with reaching outside BOS   Assessment/Plan    PT Assessment Patient needs continued PT services  PT Problem List Decreased strength;Decreased activity tolerance;Decreased balance;Decreased mobility       PT Treatment Interventions DME instruction;Gait training;Stair training;Functional mobility training;Therapeutic activities;Therapeutic exercise;Balance training;Patient/family education    PT Goals (Current goals can be found in the Care Plan section)  Acute Rehab PT Goals Patient Stated Goal: To improve my balance so I can walk [place]  the beach again PT Goal Formulation: With patient Time For Goal Achievement: 06/26/19 Potential to Achieve Goals: Good    Frequency Min 2X/week   Barriers to discharge        Co-evaluation               AM-PAC PT "6 Clicks" Mobility  Outcome Measure Help needed turning from your back to your side while in a flat bed without using bedrails?: None Help needed moving from lying on your back to sitting on the side of a flat bed without using bedrails?: None Help needed moving to and from a bed to a chair (including a wheelchair)?: A Little Help needed standing up from a chair using your arms (e.g., wheelchair or bedside chair)?: A Little Help needed to walk in hospital room?: A Little Help needed climbing 3-5 steps with a railing? : A Little 6 Click Score: 20    End of Session Equipment Utilized During Treatment: Gait belt Activity Tolerance: Patient tolerated treatment well Patient left: in chair;with call bell/phone within reach;with chair alarm set Nurse Communication: Mobility status PT Visit Diagnosis: Unsteadiness on feet (R26.81);Difficulty in walking, not elsewhere classified (R26.2);Muscle weakness (generalized) (M62.81)    Time: 2947-6546 PT Time Calculation (min) (ACUTE ONLY): 42 min   Charges:   PT Evaluation $PT Eval Low Complexity: 1 Low PT Treatments $Therapeutic Exercise: 8-22 mins        D. Royetta Asal PT, DPT 06/13/19, 4:23 PM

## 2019-06-13 NOTE — ED Notes (Signed)
ED TO INPATIENT HANDOFF REPORT  ED Nurse Name and Phone #: [place] [phone]  S Name/Age/Gender Melody Ochoa 78 y.o. female Room/Bed: ED05A/ED05A  Code Status   Code Status: Full Code  Home/SNF/Other Home Patient oriented to: self, place, time and situation Is this baseline? Yes   Triage Complete: Triage complete  Chief Complaint slurred speech  Triage Note ARrives c/o 'having trouble getting my words out"  States woke up this morning with symptoms.  Patient states she went to bed last night at around 1930, but didn't fall asleep until around midnight.  Patient's husband states patient was under the weather last night, having difficulty walking and moving around and speaking yesterday.  He noticed symptoms yesterday at around 1030.  Patient states she was feeling dizzy last night.  Patient states she felt dizzy all day yesterday.  Right facial droop noted.   MAE equally.  Patient states left arm feel weak, but that is not new today.  Has an injury to left arm.   Allergies Allergies  Allergen Reactions  . Aleve [Naproxen Sodium] Other (See Comments)    Gi upset  . Aspirin Other (See Comments)    Stomach pain-aggravates diverticulosis  . Celebrex [Celecoxib] Other (See Comments)    Dizziness   . Effexor [Venlafaxine] Other (See Comments)    Hot flashes   . Gabapentin Other (See Comments)    "Night terrors"  . Hydrocodone-Homatropine Diarrhea  . Ibuprofen Other (See Comments)    Gi upset   . Tape Itching and Other (See Comments)    Itchy blisters  . Vioxx [Rofecoxib] Other (See Comments)    Gi upset  . Other Rash and Other (See Comments)    bandaides  . Tramadol Other (See Comments)    hallucinations    Level of Care/Admitting Diagnosis ED Disposition    ED Disposition Condition [facility]: [place] [100120]  Level of Care: Med-Surg [16]  Covid Evaluation: Confirmed COVID Negative  Diagnosis: Stroke (cerebrum)  [facility]) [[id number]]  Admitting Physician: Eula Flax  Attending Physician: Rufina Falco ACHIENG [phone]  Estimated length of stay: past midnight tomorrow  Certification:: I certify this patient will need inpatient services for at least 2 midnights  PT Class (Do Not Modify): Inpatient [101]  PT Acc Code (Do Not Modify): Private [1]       B Medical/Surgery History Past Medical History:  Diagnosis Date  . Arthritis   . Atheromatous plaque 10/18/2017  . Back pain   . Cancer ([place])    skin  . Depression   . Diverticulosis   . Dizziness    patient had episode of dizziness when came in room. hx past no dx  . GERD (gastroesophageal reflux disease)   . Heart murmur   . Hyperlipidemia 10/18/2017  . PONV (postoperative nausea and vomiting)    yrs ago  . Stroke [facility])    Past Surgical History:  Procedure Laterality Date  . BACK SURGERY  13  . BREAST BIOPSY Bilateral    cores "years ago"  . BREAST EXCISIONAL BIOPSY Left 90's  . CATARACT EXTRACTION W/PHACO Left 12/20/2018   Procedure: CATARACT EXTRACTION PHACO AND INTRAOCULAR LENS PLACEMENT ([place]) LEFT TOPICAL;  Surgeon: Leandrew Koyanagi, MD;  Location: [place];  Service: Ophthalmology;  Laterality: Left;  . CATARACT EXTRACTION W/PHACO Right 01/24/2019   Procedure: CATARACT EXTRACTION PHACO AND INTRAOCULAR LENS PLACEMENT ([place]) RIGHT;  Surgeon: Leandrew Koyanagi, MD;  Location: [place];  Service: Ophthalmology;  Laterality: Right;  . CERVICAL DISC SURGERY  09  . COLONOSCOPY WITH PROPOFOL N/A 01/13/2016   Procedure: COLONOSCOPY WITH PROPOFOL;  Surgeon: [name], MD;  Location: [facility] ENDOSCOPY;  Service: Endoscopy;  Laterality: N/A;  . FACELIFT  94  . LOOP RECORDER INSERTION N/A 08/23/2017   Procedure: LOOP RECORDER INSERTION;  Surgeon: Thompson Grayer, MD;  Location: [place] CV LAB;  Service: Cardiovascular;  Laterality: N/A;  . TEE WITHOUT CARDIOVERSION N/A 08/12/2017   Procedure:  TRANSESOPHAGEAL ECHOCARDIOGRAM (TEE);  Surgeon: Sueanne Margarita, MD;  Location: [facility] ENDOSCOPY;  Service: Cardiovascular;  Laterality: N/A;  . TONSILLECTOMY    . TOTAL HIP ARTHROPLASTY Right 04/22/2016   Procedure: TOTAL HIP ARTHROPLASTY;  Surgeon: Corky [place], MD;  Location: [facility] ORS;  Service: Orthopedics;  Laterality: Right;     A IV Location/Drains/Wounds Patient Lines/Drains/Airways Status   Active Line/Drains/Airways    Name:   Placement date:   Placement time:   Site:   Days:   Peripheral IV 06/13/19 Left Antecubital   06/13/19    1003    Antecubital   less than 1   Epidural Catheter 08/21/18   08/21/18    1300     296   Incision (Closed) 04/22/16 Hip Right   04/22/16    0911     1147   Incision (Closed) 08/23/17 Chest Left;Upper   08/23/17    0805     659   Incision (Closed) 12/20/18 Eye   12/20/18    1023     175   Incision (Closed) 01/24/19 Eye   01/24/19    1041     140          Intake/Output Last 24 hours No intake or output data in the 24 hours ending 06/13/19 1313  Labs/Imaging Results for orders placed or performed during the hospital encounter of 06/13/19 (from the past 48 hour(s))  Glucose, capillary     Status: Abnormal   Collection Time: 06/13/19  9:38 AM  Result Value Ref Range   Glucose-Capillary 107 (H) 70 - 99 mg/dL   Comment 1 Notify RN    Comment 2 Document in Chart   Protime-INR     Status: None   Collection Time: 06/13/19 10:01 AM  Result Value Ref Range   Prothrombin Time 12.9 11.4 - 15.2 seconds   INR 1.0 0.8 - 1.2    Comment: (NOTE) INR goal varies based on device and disease states. Performed at [facility], [address] [place], [place] [address]   APTT     Status: None   Collection Time: 06/13/19 10:01 AM  Result Value Ref Range   aPTT 31 24 - 36 seconds    Comment: Performed at [facility], [address] [place], [place] [address]  CBC     Status: Abnormal   Collection Time: 06/13/19 10:01 AM  Result Value  Ref Range   WBC 6.5 4.0 - 10.5 K/uL   RBC 3.58 (L) 3.87 - 5.11 MIL/uL   Hemoglobin 9.9 (L) 12.0 - 15.0 g/dL   HCT 31.0 (L) 36.0 - 46.0 %   MCV 86.6 80.0 - 100.0 fL   MCH 27.7 26.0 - 34.0 pg   MCHC 31.9 30.0 - 36.0 g/dL   RDW 14.5 11.5 - 15.5 %   Platelets 209 150 - 400 K/uL   nRBC 0.0 0.0 - 0.2 %    Comment: Performed at [facility], [address] [place],  [place] [address]  Differential     Status: None   Collection Time: 06/13/19 10:01 AM  Result Value Ref Range   Neutrophils Relative % 69 %   Neutro Abs 4.5 1.7 - 7.7 K/uL   Lymphocytes Relative 16 %   Lymphs Abs 1.1 0.7 - 4.0 K/uL   Monocytes Relative 9 %   Monocytes Absolute 0.6 0.1 - 1.0 K/uL   Eosinophils Relative 5 %   Eosinophils Absolute 0.3 0.0 - 0.5 K/uL   Basophils Relative 1 %   Basophils Absolute 0.1 0.0 - 0.1 K/uL   Immature Granulocytes 0 %   Abs Immature Granulocytes 0.02 0.00 - 0.07 K/uL    Comment: Performed at [facility], [address] [place], [place] [address]  Comprehensive metabolic panel     Status: Abnormal   Collection Time: 06/13/19 10:01 AM  Result Value Ref Range   Sodium 136 135 - 145 mmol/L   Potassium 4.3 3.5 - 5.1 mmol/L   Chloride 104 98 - 111 mmol/L   CO2 23 22 - 32 mmol/L   Glucose, Bld 101 (H) 70 - 99 mg/dL   BUN 16 8 - 23 mg/dL   Creatinine, Ser 1.00 0.44 - 1.00 mg/dL   Calcium 9.1 8.9 - 10.3 mg/dL   Total Protein 7.0 6.5 - 8.1 g/dL   Albumin 4.0 3.5 - 5.0 g/dL   AST 30 15 - 41 U/L   ALT 33 0 - 44 U/L   Alkaline Phosphatase 65 38 - 126 U/L   Total Bilirubin 0.3 0.3 - 1.2 mg/dL   GFR calc non Af Amer 54 (L) >60 mL/min   GFR calc Af Amer >60 >60 mL/min   Anion gap 9 5 - 15    Comment: Performed at [facility], [address] [place], [place] [address]  Urine Drug Screen, Qualitative     Status: None   Collection Time: 06/13/19 11:07 AM  Result Value Ref Range   Tricyclic, Ur Screen NONE DETECTED NONE DETECTED   Amphetamines, Ur Screen NONE  DETECTED NONE DETECTED   MDMA (Ecstasy)Ur Screen NONE DETECTED NONE DETECTED   Cocaine Metabolite,Ur [place] NONE DETECTED NONE DETECTED   Opiate, Ur Screen NONE DETECTED NONE DETECTED   Phencyclidine (PCP) Ur S NONE DETECTED NONE DETECTED   Cannabinoid 50 Ng, Ur  NONE DETECTED NONE DETECTED   Barbiturates, Ur Screen NONE DETECTED NONE DETECTED   Benzodiazepine, Ur Scrn NONE DETECTED NONE DETECTED   Methadone Scn, Ur NONE DETECTED NONE DETECTED    Comment: (NOTE) Tricyclics + metabolites, urine    Cutoff 1000 ng/mL Amphetamines + metabolites, urine  Cutoff 1000 ng/mL MDMA (Ecstasy), urine              Cutoff 500 ng/mL Cocaine Metabolite, urine          Cutoff 300 ng/mL Opiate + metabolites, urine        Cutoff 300 ng/mL Phencyclidine (PCP), urine         Cutoff 25 ng/mL Cannabinoid, urine                 Cutoff 50 ng/mL Barbiturates + metabolites, urine  Cutoff 200 ng/mL Benzodiazepine, urine              Cutoff 200 ng/mL Methadone, urine                   Cutoff 300 ng/mL The urine drug screen provides only a preliminary, unconfirmed analytical test result and should  not be used for non-medical purposes. Clinical consideration and professional judgment should be applied to any positive drug screen result due to possible interfering substances. A more specific alternate chemical method must be used in order to obtain a confirmed analytical result. Gas chromatography / mass spectrometry (GC/MS) is the preferred confirmat ory method. Performed at [facility], [address] [place], [place] [address]   Urinalysis, Complete w Microscopic     Status: Abnormal   Collection Time: 06/13/19 11:07 AM  Result Value Ref Range   Color, Urine YELLOW (A) YELLOW   APPearance CLEAR (A) CLEAR   Specific Gravity, Urine 1.013 1.005 - 1.030   pH 7.0 5.0 - 8.0   Glucose, UA NEGATIVE NEGATIVE mg/dL   Hgb urine dipstick NEGATIVE NEGATIVE   Bilirubin Urine NEGATIVE NEGATIVE   Ketones, ur  NEGATIVE NEGATIVE mg/dL   Protein, ur NEGATIVE NEGATIVE mg/dL   Nitrite NEGATIVE NEGATIVE   Leukocytes,Ua NEGATIVE NEGATIVE   RBC / HPF 0-5 0 - 5 RBC/hpf   WBC, UA 0-5 0 - 5 WBC/hpf   Bacteria, UA NONE SEEN NONE SEEN   Squamous Epithelial / LPF 0-5 0 - 5   Mucus PRESENT    Hyaline Casts, UA PRESENT     Comment: Performed at [facility], [address], [place], [place] [address]  SARS Coronavirus 2 (CEPHEID- Performed in [place] hospital lab), Hosp Order     Status: None   Collection Time: 06/13/19 11:07 AM   Specimen: Nasopharyngeal Swab  Result Value Ref Range   SARS Coronavirus 2 NEGATIVE NEGATIVE    Comment: (NOTE) If result is NEGATIVE SARS-CoV-2 target nucleic acids are NOT DETECTED. The SARS-CoV-2 RNA is generally detectable in upper and lower  respiratory specimens during the acute phase of infection. The lowest  concentration of SARS-CoV-2 viral copies this assay can detect is 250  copies / mL. A negative result does not preclude SARS-CoV-2 infection  and should not be used as the sole basis for treatment or other  patient management decisions.  A negative result may occur with  improper specimen collection / handling, submission of specimen other  than nasopharyngeal swab, presence of viral mutation(s) within the  areas targeted by this assay, and inadequate number of viral copies  (<250 copies / mL). A negative result must be combined with clinical  observations, patient history, and epidemiological information. If result is POSITIVE SARS-CoV-2 target nucleic acids are DETECTED. The SARS-CoV-2 RNA is generally detectable in upper and lower  respiratory specimens dur ing the acute phase of infection.  Positive  results are indicative of active infection with SARS-CoV-2.  Clinical  correlation with patient history and other diagnostic information is  necessary to determine patient infection status.  Positive results do  not rule out bacterial infection or  co-infection with other viruses. If result is PRESUMPTIVE POSTIVE SARS-CoV-2 nucleic acids MAY BE PRESENT.   A presumptive positive result was obtained on the submitted specimen  and confirmed on repeat testing.  While 2019 novel coronavirus  (SARS-CoV-2) nucleic acids may be present in the submitted sample  additional confirmatory testing may be necessary for epidemiological  and / or clinical management purposes  to differentiate between  SARS-CoV-2 and other Sarbecovirus currently known to infect humans.  If clinically indicated additional testing with an alternate test  methodology [phone]) is advised. The SARS-CoV-2 RNA is generally  detectable in upper and lower respiratory sp ecimens during the acute  phase of infection. The expected result  is Negative. Fact Sheet for Patients:  StrictlyIdeas.no Fact Sheet for Healthcare Providers: BankingDealers.co.za This test is not yet approved or cleared by the [place] FDA and has been authorized for detection and/or diagnosis of SARS-CoV-2 by FDA under an Emergency Use Authorization (EUA).  This EUA will remain in effect (meaning this test can be used) for the duration of the COVID-19 declaration under Section 564(b)(1) of the Act, 21 U.S.C. section 360bbb-3(b)(1), unless the authorization is terminated or revoked sooner. Performed at [facility], [place], [place] [address]    Ct Head Wo Contrast  Result Date: 06/13/2019 CLINICAL DATA:  Mental status changes since last evening. Dizziness. EXAM: CT HEAD WITHOUT CONTRAST TECHNIQUE: Contiguous axial images were obtained from the base of the skull through the vertex without intravenous contrast. COMPARISON:  04/11/2018 FINDINGS: Brain: Stable age related cerebral atrophy, ventriculomegaly and periventricular white matter disease. No extra-axial fluid collections are identified. No CT findings for acute hemispheric  infarction or intracranial hemorrhage. No mass lesions. Remote left occipital infarct. The brainstem and cerebellum are normal. Vascular: Stable vascular calcifications. No definite aneurysm or hyperdense vessels. Skull: No skull fracture or bone lesions. Sinuses/Orbits: The paranasal sinuses and mastoid air cells are clear. The globes are intact. Other: No scalp lesions or hematoma. IMPRESSION: 1. Stable age related cerebral atrophy, ventriculomegaly and periventricular white matter disease. 2. Remote occipital infarct on the left. 3. No acute intracranial findings or mass lesion. Electronically Signed   By: Marijo Sanes M.D.   On: 06/13/2019 10:44    Pending Labs Unresulted Labs (From admission, onward)    Start     Ordered   06/13/19 1258  Hemoglobin A1c  Add-on,   AD     06/13/19 1258   06/13/19 1258  Lipid panel  Add-on,   AD    Comments: Fasting    06/13/19 1258          Vitals/Pain Today's Vitals   06/13/19 0937 06/13/19 0940 06/13/19 1100 06/13/19 1200  BP: (!) 146/69   (!) 165/87  Pulse: 92  77 77  Resp: 16  17 (!) 25  Temp: 98 F (36.7 C)     TempSrc: Oral     SpO2: 96%  98% 98%  Weight:  91.5 kg    Height:  5\' 4"  (1.626 m)    PainSc:  0-No pain      Isolation Precautions No active isolations  Medications Medications  atorvastatin (LIPITOR) tablet 80 mg (has no administration in time range)  buPROPion (WELLBUTRIN XL) 24 hr tablet 300 mg (has no administration in time range)  DULoxetine (CYMBALTA) DR capsule 60 mg (has no administration in time range)  famotidine (PEPCID) tablet 20 mg (has no administration in time range)  clopidogrel (PLAVIX) tablet 75 mg (has no administration in time range)  ferrous sulfate tablet 325 mg (has no administration in time range)  pregabalin (LYRICA) capsule 50 mg (has no administration in time range)  fluticasone (FLONASE) 50 MCG/ACT nasal spray 1 spray (has no administration in time range)  albuterol (PROVENTIL) (2.5 MG/3ML)  0.083% nebulizer solution 2.5 mg (has no administration in time range)  lidocaine (LIDODERM) 5 % 1 patch (has no administration in time range)  triamcinolone cream (KENALOG) 0.1 % 1 application (has no administration in time range)   stroke: mapping our early stages of recovery book (has no administration in time range)  0.9 %  sodium chloride infusion (has no administration in time range)  acetaminophen (TYLENOL)  tablet 650 mg (has no administration in time range)    Or  acetaminophen (TYLENOL) solution 650 mg (has no administration in time range)    Or  acetaminophen (TYLENOL) suppository 650 mg (has no administration in time range)  senna-docusate (Senokot-S) tablet 1 tablet (has no administration in time range)  enoxaparin (LOVENOX) injection 40 mg (has no administration in time range)  ipratropium-albuterol (DUONEB) 0.5-2.5 (3) MG/3ML nebulizer solution 3 mL (has no administration in time range)    Mobility walks with person assist High fall risk   Focused Assessments Neuro Assessment Handoff:  Swallow screen pass? Yes  Cardiac Rhythm: Normal sinus rhythm NIH Stroke Scale ( + Modified Stroke Scale Criteria)  Interval: Initial Level of Consciousness (1a.)   : Alert, keenly responsive LOC Questions (1b. )   +: Answers both questions correctly LOC Commands (1c. )   + : Performs both tasks correctly Best Gaze (2. )  +: Normal Visual (3. )  +: No visual loss Facial Palsy (4. )    : Normal symmetrical movements Motor Arm, Left (5a. )   +: No drift Motor Arm, Right (5b. )   +: No drift Motor Leg, Left (6a. )   +: No drift Motor Leg, Right (6b. )   +: No drift Limb Ataxia (7. ): Absent Sensory (8. )   +: Normal, no sensory loss Best Language (9. )   +: No aphasia Dysarthria (10. ): Normal Extinction/Inattention (11.)   +: No Abnormality Modified SS Total  +: 0 Complete NIHSS TOTAL: 0 Last date known well: 06/12/19   Neuro Assessment: Exceptions to WDL Neuro Checks:    Initial (06/13/19 1030)  Last Documented NIHSS Modified Score: 0 (06/13/19 1030) Has TPA been given? No If patient is a Neuro Trauma and patient is going to OR before floor call report to [place] nurse: [phone] or [phone]     R Recommendations: See Admitting Provider Note  Report given to:   Additional Notes:

## 2019-06-13 NOTE — H&P (Signed)
[facility] NAME: Melody Ochoa    MR#:  [id number]  DATE OF BIRTH:  Apr 22, 1941  DATE OF ADMISSION:  06/13/2019  PRIMARY CARE PHYSICIAN: Burnard Hawthorne, FNP   REQUESTING/REFERRING PHYSICIAN: Merlyn Lot, MD  CHIEF COMPLAINT:   Chief Complaint  Patient presents with   Aphasia   HISTORY OF PRESENT ILLNESS:  79 y.o. female with pertinent past medical history of normocytic anemia, chronic pain syndrome, hyperlipidemia, CVA s/p tPA (9/18), B12 deficiency, lumbar DDD, depression and pulmonary fibrosis presenting to the ED with chief complaints of word finding difficulty, slurred speech and being "off balance".  Patient reports that she was feeling unsteady on her feet and somewhat dizzy yesterday prior to going to bed at wround 8:30 pm, she denies any other symptoms. However when she woke up this morning at around 6 am, her husband noticed that her speech was "off" sounding gibberish, slurred and unable to find her words. She still had difficulty walking and moving around. Denies associated altered sensorium, cranial nerve deficit, seizures, focal motor or sensory deficits, diplopia, nausea or vomiting, syncope or LOC, paresthesia (numbness, tingling, pins-and-needles sensation) or a heavy feeling in an extremity.  On arrival to the ED, he was afebrile with blood pressure 146/69 mm Hg and pulse rate 92 beats/min. She was initially noted with right facial droop which was not apparent on my exam; she was alert and oriented x4, and he did not demonstrate any memory deficits.  Initial NIH stroke scale 0.  Noncontrast CT head was obtained and showed no acute intracranial abnormality.  Labs unremarkable.  The patient has remained free from recurrent events and maintains a secondary prevention medication regimen including 75mg  of Plavix and 80mg  of atorvastatin. Patient will be admitted under hospitalist service for further stroke work up and  managment  PAST MEDICAL HISTORY:   Past Medical History:  Diagnosis Date   Arthritis    Atheromatous plaque 10/18/2017   Back pain    Cancer ([place])    skin   Depression    Diverticulosis    Dizziness    patient had episode of dizziness when came in room. hx past no dx   GERD (gastroesophageal reflux disease)    Heart murmur    Hyperlipidemia 10/18/2017   PONV (postoperative nausea and vomiting)    yrs ago   Stroke [facility])     PAST SURGICAL HISTORY:   Past Surgical History:  Procedure Laterality Date   BACK SURGERY  13   BREAST BIOPSY Bilateral    cores "years ago"   BREAST EXCISIONAL BIOPSY Left 90's   CATARACT EXTRACTION W/PHACO Left 12/20/2018   Procedure: CATARACT EXTRACTION PHACO AND INTRAOCULAR LENS PLACEMENT ([place]) LEFT TOPICAL;  Surgeon: Leandrew Koyanagi, MD;  Location: [facility];  Service: Ophthalmology;  Laterality: Left;   CATARACT EXTRACTION W/PHACO Right 01/24/2019   Procedure: CATARACT EXTRACTION PHACO AND INTRAOCULAR LENS PLACEMENT ([place]) RIGHT;  Surgeon: Leandrew Koyanagi, MD;  Location: [facility];  Service: Ophthalmology;  Laterality: Right;   CERVICAL DISC SURGERY  09   COLONOSCOPY WITH PROPOFOL N/A 01/13/2016   Procedure: COLONOSCOPY WITH PROPOFOL;  Surgeon: [name], MD;  Location: [facility] ENDOSCOPY;  Service: Endoscopy;  Laterality: N/A;   FACELIFT  94   LOOP RECORDER INSERTION N/A 08/23/2017   Procedure: LOOP RECORDER INSERTION;  Surgeon: Thompson Grayer, MD;  Location: [facility];  Service: Cardiovascular;  Laterality: N/A;   TEE WITHOUT CARDIOVERSION N/A 08/12/2017  Procedure: TRANSESOPHAGEAL ECHOCARDIOGRAM (TEE);  Surgeon: Sueanne Margarita, MD;  Location: [facility] ENDOSCOPY;  Service: Cardiovascular;  Laterality: N/A;   TONSILLECTOMY     TOTAL HIP ARTHROPLASTY Right 04/22/2016   Procedure: TOTAL HIP ARTHROPLASTY;  Surgeon: Corky Mull, MD;  Location: [facility] ORS;  Service: Orthopedics;  Laterality: Right;     SOCIAL HISTORY:   Social History   Tobacco Use   Smoking status: Former Smoker    Packs/day: 1.00    Years: 40.00    Pack years: 40.00    Types: Cigarettes    Quit date: 11/13/1996    Years since quitting: 22.5   Smokeless tobacco: Never Used  Substance Use Topics   Alcohol use: Yes    Comment: occ wine    FAMILY HISTORY:   Family History  Problem Relation Age of Onset   Lung cancer Father    Aneurysm Brother    Stroke Brother    Diabetes Maternal Grandmother    Kidney disease Maternal Grandfather    Cushing syndrome Paternal Grandmother    Dementia Paternal Grandfather    Breast cancer Maternal Aunt 52    DRUG ALLERGIES:   Allergies  Allergen Reactions   Aleve [Naproxen Sodium] Other (See Comments)    Gi upset   Aspirin Other (See Comments)    Stomach pain-aggravates diverticulosis   Celebrex [Celecoxib] Other (See Comments)    Dizziness    Effexor [Venlafaxine] Other (See Comments)    Hot flashes    Gabapentin Other (See Comments)    "Night terrors"   Hydrocodone-Homatropine Diarrhea   Ibuprofen Other (See Comments)    Gi upset    Tape Itching and Other (See Comments)    Itchy blisters   Vioxx [Rofecoxib] Other (See Comments)    Gi upset   Other Rash and Other (See Comments)    bandaides   Tramadol Other (See Comments)    hallucinations    REVIEW OF SYSTEMS:   Review of Systems  Constitutional: Negative for chills, fever, malaise/fatigue and weight loss.  HENT: Negative for congestion, hearing loss and sore throat.   Eyes: Negative for blurred vision and double vision.  Respiratory: Negative for cough, shortness of breath and wheezing.   Cardiovascular: Negative for chest pain, palpitations, orthopnea and leg swelling.  Gastrointestinal: Negative for abdominal pain, diarrhea, nausea and vomiting.  Genitourinary: Negative for dysuria and urgency.  Musculoskeletal: Negative for myalgias.  Skin: Negative for rash.   Neurological: Positive for dizziness, speech change and weakness. Negative for sensory change, focal weakness and headaches.  Psychiatric/Behavioral: Negative for depression.   MEDICATIONS AT HOME:   Prior to Admission medications   Medication Sig Start Date End Date Taking? Authorizing Provider  atorvastatin (LIPITOR) 80 MG tablet TAKE 1 TABLET (80 MG TOTAL) BY MOUTH DAILY AT 6 PM. MUST SCHEDUALE APPT. FOR FURTHER REFILLS. 03/30/19   Deboraha Sprang, MD  buPROPion (WELLBUTRIN XL) 300 MG 24 hr tablet Take 300 mg by mouth daily.  07/15/17   [provider]  Calcium Carbonate-Vitamin D (CALTRATE 600+D PO) Take 1 tablet by mouth daily.    [provider]  clopidogrel (PLAVIX) 75 MG tablet TAKE 1 TABLET BY MOUTH EVERY DAY 03/07/19   Deboraha Sprang, MD  diclofenac sodium (VOLTAREN) 1 % GEL Apply 4 g topically 4 (four) times daily. 05/11/19   Burnard Hawthorne, FNP  DULoxetine (CYMBALTA) 60 MG capsule Take 60 mg by mouth 2 (two) times daily.     [provider]  famotidine (PEPCID) 20 MG tablet Take 20 mg by mouth at bedtime.  11/07/18   [provider]  ferrous sulfate 325 (65 FE) MG tablet Take 1 tablet (325 mg total) by mouth daily. 08/12/17   Patteson, Arlan Organ, NP  fluticasone (FLONASE) 50 MCG/ACT nasal spray Place 1 spray into both nostrils daily as needed for allergies or rhinitis (congestion).     [provider]  lidocaine (LIDODERM) 5 % Place 1 patch onto the skin daily. Remove & Discard patch within 12 hours. 05/11/19   Burnard Hawthorne, FNP  NON FORMULARY Place 1 drop into the right eye daily. (Prednisolone Acetate/Gatifloxacin)    [provider]  pregabalin (LYRICA) 50 MG capsule Take 1 capsule (50 mg total) by mouth 3 (three) times daily. 03/08/19 06/06/19  Gillis Santa, MD  tiotropium (SPIRIVA) 18 MCG inhalation capsule Place 1 capsule (18 mcg total) into inhaler and inhale daily for 30 days. 01/17/19 02/16/19  Laverle Hobby, MD   triamcinolone cream (KENALOG) 0.1 % Apply 1 application topically 2 (two) times daily as needed (rash/skin irritation.).  02/03/18   [provider]  VENTOLIN HFA 108 (90 Base) MCG/ACT inhaler TAKE 2 PUFFS BY MOUTH EVERY 6 HOURS AS NEEDED FOR WHEEZE OR SHORTNESS OF BREATH 06/11/19   Burnard Hawthorne, FNP    VITAL SIGNS:  Blood pressure (!) 165/87, pulse 77, temperature 98 F (36.7 C), temperature source Oral, resp. rate (!) 25, height 5\' 4"  (1.626 m), weight 91.5 kg, SpO2 98 %.  PHYSICAL EXAMINATION:   Physical Exam  GENERAL:  78 y.o.-year-old patient lying in the bed with no acute distress.  EYES: Pupils equal, round, reactive to light and accommodation. No scleral icterus. Extraocular muscles intact.  HEENT: Head atraumatic, normocephalic. Oropharynx and nasopharynx clear.  NECK:  Supple, no jugular venous distention. No thyroid enlargement, no tenderness.  LUNGS: Normal breath sounds bilaterally, no wheezing, rales,rhonchi or crepitation. No use of accessory muscles of respiration.  CARDIOVASCULAR: S1, S2 normal. No murmurs, rubs, or gallops.  ABDOMEN: Soft, nontender, nondistended. Bowel sounds present. No organomegaly or mass.  EXTREMITIES: No pedal edema, cyanosis, or clubbing. No rash or lesions. + pedal pulses MUSCULOSKELETAL: Normal bulk, and power was 5+ grip and elbow, knee, and ankle flexion and extension bilaterally.  NEUROLOGIC:Alert and oriented x 3. Speech slurred. CN 2-12 intact  Sensation to light touch and cold stimuli intact bilaterally. Finger to nose nl slightly dysmetric on the left. Babinski is downgoing. DTR's (biceps, patellar, and achilles) 2+ and symmetric throughout. Gait not tested due to safety concern. PSYCHIATRIC: The patient is alert and oriented x 3.  SKIN: No obvious rash, lesion, or ulcer.   DATA REVIEWED:  LABORATORY PANEL:   CBC Recent Labs  Lab 06/13/19 1001  WBC 6.5  HGB 9.9*  HCT 31.0*  PLT 209    ------------------------------------------------------------------------------------------------------------------  Chemistries  Recent Labs  Lab 06/13/19 1001  NA 136  K 4.3  CL 104  CO2 23  GLUCOSE 101*  BUN 16  CREATININE 1.00  CALCIUM 9.1  AST 30  ALT 33  ALKPHOS 65  BILITOT 0.3   ------------------------------------------------------------------------------------------------------------------  Cardiac Enzymes No results for input(s): TROPONINI in the last 168 hours. ------------------------------------------------------------------------------------------------------------------  RADIOLOGY:  Ct Head Wo Contrast  Result Date: 06/13/2019 CLINICAL DATA:  Mental status changes since last evening. Dizziness. EXAM: CT HEAD WITHOUT CONTRAST TECHNIQUE: Contiguous axial images were obtained from the base of the skull through the vertex without intravenous contrast. COMPARISON:  04/11/2018 FINDINGS: Brain: Stable  age related cerebral atrophy, ventriculomegaly and periventricular white matter disease. No extra-axial fluid collections are identified. No CT findings for acute hemispheric infarction or intracranial hemorrhage. No mass lesions. Remote left occipital infarct. The brainstem and cerebellum are normal. Vascular: Stable vascular calcifications. No definite aneurysm or hyperdense vessels. Skull: No skull fracture or bone lesions. Sinuses/Orbits: The paranasal sinuses and mastoid air cells are clear. The globes are intact. Other: No scalp lesions or hematoma. IMPRESSION: 1. Stable age related cerebral atrophy, ventriculomegaly and periventricular white matter disease. 2. Remote occipital infarct on the left. 3. No acute intracranial findings or mass lesion. Electronically Signed   By: Marijo Sanes M.D.   On: 06/13/2019 10:44   EKG:  EKG: normal EKG, normal sinus rhythm, unchanged from previous tracings. Vent. rate 89 BPM PR interval 166 ms QRS duration 96 ms QT/QTc 386/469  ms P-R-T axes 70 -3 83 IMPRESSION AND PLAN:   78 y.o. female with pertinent past medical history of normocytic anemia, chronic pain syndrome, hyperlipidemia, CVA s/p tPA (9/18), B12 deficiency, lumbar DDD, depression and pulmonary fibrosis presenting to the ED with chief complaints of word finding difficulty, slurred speech and being "off balance".  1. Speech disturbances - concerns for TIA/Ischemic event in a patient with hx of stroke s/p IV alteplase 9/18 - CT head reviewed and negative for acute intracranial abnormality - Admit to telemetry unit - HgbA1c, fasting lipid panel - MRI/MRA of the brain without contrast - PT consult, OT consult, Speech consult - Echocardiogram - Carotid dopplers -  Prophylactic therapy-Antiplatelet med: Plavix - dose 75mg /day, patient is allergic to Aspirin -  NPO until RN stroke swallow screen - Telemetry monitoring -  Frequent neuro checks - Neurology consult placed. Message sent via Haiku to Dr. Creig Hines  2. HLD  + Goal LDL<100 - Atorvastatin 80mg  PO qhs   3. Chronic pain syndrome - hx of lumbar DDD - Continue Cymbalta and Lyrica - Lidoderm patch  4. Pulmonary Fibrosis - currently not symptomatic - Follow with pulmonologist Dr. Ashby Dawes - Continue Ventolin  5. Depression - Continue Wellbutrin  6. DVT prophylaxis - Enoxaparin SubQ    All the records are reviewed and case discussed with ED provider. Management plans discussed with the patient, family and they are in agreement.  CODE STATUS: FULL  TOTAL TIME TAKING CARE OF THIS PATIENT: 40 minutes.    on 06/13/2019 at 12:59 PM  Rufina Falco, DNP, FNP-BC [facility] Nurse Practitioner Between 7am to 6pm - Pager [phone]  After 6pm go to [email or web address] - password [email or web address] [facility]  Office  [phone]  CC: Primary care physician; Burnard Hawthorne, FNP

## 2019-06-13 NOTE — Progress Notes (Signed)
OT Cancellation Note  Patient Details Name: Melody Ochoa MRN: [id number] DOB: March 23, 1941   Cancelled Treatment:    Reason Eval/Treat Not Completed: Patient at procedure or test/ unavailable. Thank you for the OT consult. Order received and chart reviewed. Upon arrival to pt room, pt with PT for session. Will re-attempt at a later time/date as available and pt medically appropriate for OT.  Shara Blazing, M.S., OTR/L Ascom: [phone] 06/13/19, 3:28 PM

## 2019-06-13 NOTE — ED Triage Notes (Addendum)
ARrives c/o 'having trouble getting my words out"  States woke up this morning with symptoms.  Patient states she went to bed last night at around 1930, but didn't fall asleep until around midnight.  Patient's husband states patient was under the weather last night, having difficulty walking and moving around and speaking yesterday.  He noticed symptoms yesterday at around 1030.  Patient states she was feeling dizzy last night.  Patient states she felt dizzy all day yesterday.  Right facial droop noted.   MAE equally.  Patient states left arm feel weak, but that is not new today.  Has an injury to left arm.

## 2019-06-13 NOTE — ED Provider Notes (Signed)
[facility] Emergency Department Provider Note    First MD Initiated Contact with Patient 06/13/19 [phone]     (approximate)  I have reviewed the triage vital signs and the nursing notes.   HISTORY  Chief Complaint Aphasia    HPI Melody Ochoa is a 78 y.o. female presents the ER for evaluation of confusion slurred speech and unsteady gait since yesterday.  Husband with patient states that she was acting abnormally even yesterday morning.  He felt that she had not gotten much sleep and that that could have been because of her symptoms but she woke up with persistent slurred speech today and has a history of TIA so that brought her to the ER for evaluation.  She denies any pain.  No headache.  No blurred vision.  States that she feels "shaky "and weak.    Past Medical History:  Diagnosis Date   Arthritis    Atheromatous plaque 10/18/2017   Back pain    Cancer ([place])    skin   Depression    Diverticulosis    Dizziness    patient had episode of dizziness when came in room. hx past no dx   GERD (gastroesophageal reflux disease)    Heart murmur    Hyperlipidemia 10/18/2017   PONV (postoperative nausea and vomiting)    yrs ago   Stroke [facility])    Family History  Problem Relation Age of Onset   Lung cancer Father    Aneurysm Brother    Stroke Brother    Diabetes Maternal Grandmother    Kidney disease Maternal Grandfather    Cushing syndrome Paternal Grandmother    Dementia Paternal Grandfather    Breast cancer Maternal Aunt 52   Past Surgical History:  Procedure Laterality Date   BACK SURGERY  12   BREAST BIOPSY Bilateral    cores "years ago"   BREAST EXCISIONAL BIOPSY Left 90's   CATARACT EXTRACTION W/PHACO Left 12/20/2018   Procedure: CATARACT EXTRACTION PHACO AND INTRAOCULAR LENS PLACEMENT ([place]) LEFT TOPICAL;  Surgeon: Leandrew Koyanagi, MD;  Location: [place];  Service: Ophthalmology;  Laterality: Left;     CATARACT EXTRACTION W/PHACO Right 01/24/2019   Procedure: CATARACT EXTRACTION PHACO AND INTRAOCULAR LENS PLACEMENT ([place]) RIGHT;  Surgeon: Leandrew Koyanagi, MD;  Location: [facility];  Service: Ophthalmology;  Laterality: Right;   CERVICAL DISC SURGERY  09   COLONOSCOPY WITH PROPOFOL N/A 01/13/2016   Procedure: COLONOSCOPY WITH PROPOFOL;  Surgeon: [name], MD;  Location: [facility] ENDOSCOPY;  Service: Endoscopy;  Laterality: N/A;   FACELIFT  94   LOOP RECORDER INSERTION N/A 08/23/2017   Procedure: LOOP RECORDER INSERTION;  Surgeon: Thompson Grayer, MD;  Location: [facility] CV LAB;  Service: Cardiovascular;  Laterality: N/A;   TEE WITHOUT CARDIOVERSION N/A 08/12/2017   Procedure: TRANSESOPHAGEAL ECHOCARDIOGRAM (TEE);  Surgeon: Sueanne Margarita, MD;  Location: [facility] ENDOSCOPY;  Service: Cardiovascular;  Laterality: N/A;   TONSILLECTOMY     TOTAL HIP ARTHROPLASTY Right 04/22/2016   Procedure: TOTAL HIP ARTHROPLASTY;  Surgeon: Corky Mull, MD;  Location: [facility] ORS;  Service: Orthopedics;  Laterality: Right;   Patient Active Problem List   Diagnosis Date Noted   Left shoulder pain 05/04/2019   Anemia 03/19/2019   Long term current use of opiate analgesic 01/09/2019   Foraminal stenosis of lumbar region 11/14/2018   Chronic pain syndrome 09/12/2018   SOB (shortness of breath) on exertion 08/11/2018   Dysphagia 08/11/2018   Cough 06/30/2018  Rash 01/16/2018   Hyperlipidemia 10/18/2017   History of lumbar surgery 10/04/2017   Depression, recurrent ([place]) 09/27/2017   Pulmonary nodules 09/27/2017   History of stroke 09/12/2017   Stroke (cerebrum) ([place]) 08/10/2017   Primary osteoarthritis of left hip 02/28/2017   Strain of left hip 02/28/2017   B12 deficiency 12/21/2016   Generalized OA 05/13/2016   Status post total replacement of right hip 04/22/2016   Personal history of colonic polyps    Greater trochanteric bursitis of both hips 01/09/2016    Prediabetes 08/04/2015   Thoracic aortic atherosclerosis ([place]) 06/16/2015   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   UTI (lower urinary tract infection) 11/22/2014   Low back pain 11/22/2014   Spondylolisthesis of lumbar region 11/18/2014   Lumbar radiculopathy 08/21/2014   Degeneration of lumbar or lumbosacral intervertebral disc 08/21/2014   Left hip pain 08/21/2014   Osteopenia 06/23/2014   Major depression in remission ([place]) 06/23/2014   Anxiety 06/23/2014   Menopause 06/23/2014      Prior to Admission medications   Medication Sig Start Date End Date Taking? Authorizing Provider  atorvastatin (LIPITOR) 80 MG tablet TAKE 1 TABLET (80 MG TOTAL) BY MOUTH DAILY AT 6 PM. MUST SCHEDUALE APPT. FOR FURTHER REFILLS. 03/30/19   Deboraha Sprang, MD  buPROPion (WELLBUTRIN XL) 300 MG 24 hr tablet Take 300 mg by mouth daily.  07/15/17   [provider]  Calcium Carbonate-Vitamin D (CALTRATE 600+D PO) Take 1 tablet by mouth daily.    [provider]  clopidogrel (PLAVIX) 75 MG tablet TAKE 1 TABLET BY MOUTH EVERY DAY 03/07/19   Deboraha Sprang, MD  diclofenac sodium (VOLTAREN) 1 % GEL Apply 4 g topically 4 (four) times daily. 05/11/19   Burnard Hawthorne, FNP  DULoxetine (CYMBALTA) 60 MG capsule Take 60 mg by mouth 2 (two) times daily.     [provider]  famotidine (PEPCID) 20 MG tablet Take 20 mg by mouth at bedtime.  11/07/18   [provider]  ferrous sulfate 325 (65 FE) MG tablet Take 1 tablet (325 mg total) by mouth daily. 08/12/17   Patteson, Arlan Organ, NP  fluticasone (FLONASE) 50 MCG/ACT nasal spray Place 1 spray into both nostrils daily as needed for allergies or rhinitis (congestion).     [provider]  lidocaine (LIDODERM) 5 % Place 1 patch onto the skin daily. Remove & Discard patch within 12 hours. 05/11/19   Burnard Hawthorne, FNP  NON FORMULARY Place 1 drop into the right eye daily. (Prednisolone Acetate/Gatifloxacin)    [provider]  pregabalin (LYRICA) 50 MG capsule Take 1 capsule (50 mg total) by mouth 3 (three) times daily. 03/08/19 06/06/19  Gillis Santa, MD  tiotropium (SPIRIVA) 18 MCG inhalation capsule Place 1 capsule (18 mcg total) into inhaler and inhale daily for 30 days. 01/17/19 02/16/19  Laverle Hobby, MD  triamcinolone cream (KENALOG) 0.1 % Apply 1 application topically 2 (two) times daily as needed (rash/skin irritation.).  02/03/18   [provider]  VENTOLIN HFA 108 (90 Base) MCG/ACT inhaler TAKE 2 PUFFS BY MOUTH EVERY 6 HOURS AS NEEDED FOR WHEEZE OR SHORTNESS OF BREATH 06/11/19   Burnard Hawthorne, FNP    Allergies Aleve [naproxen sodium], Aspirin, Celebrex [celecoxib], Effexor [venlafaxine], Gabapentin, Hydrocodone-homatropine, Ibuprofen, Tape, Vioxx [rofecoxib], Other, and Tramadol    Social History Social History   Tobacco Use   Smoking status: Former Smoker    Packs/day: 1.00    Years: 40.00  Pack years: 40.00    Types: Cigarettes    Quit date: 11/13/1996    Years since quitting: 22.5   Smokeless tobacco: Never Used  Substance Use Topics   Alcohol use: Yes    Comment: occ wine   Drug use: No    Review of Systems Patient denies headaches, rhinorrhea, blurry vision, numbness, shortness of breath, chest pain, edema, cough, abdominal pain, nausea, vomiting, diarrhea, dysuria, fevers, rashes or hallucinations unless otherwise stated above in HPI. ____________________________________________   PHYSICAL EXAM:  VITAL SIGNS: Vitals:   06/13/19 1100 06/13/19 1200  BP:  (!) 165/87  Pulse: 77 77  Resp: 17 (!) 25  Temp:    SpO2: 98% 98%    Constitutional: Alert and oriented.  Eyes: Conjunctivae are normal.  Head: Atraumatic. Nose: No congestion/rhinnorhea. Mouth/Throat: Mucous membranes are moist.   Neck: No stridor. Painless ROM.  Cardiovascular: Normal rate, regular rhythm. Grossly normal heart sounds.  Good peripheral circulation. Respiratory: Normal  respiratory effort.  No retractions. Lungs CTAB. Gastrointestinal: Soft and nontender. No distention. No abdominal bruits. No CVA tenderness. Genitourinary:  Musculoskeletal: No lower extremity tenderness nor edema.  No joint effusions. Neurologic: CN- intact.  No facial droop, lue drift, mild dysmetria with left fnf.  Normal heel to shin.  Sensation intact bilaterally. Slow and slurred speech with some word finding difficulty  Skin:  Skin is warm, dry and intact. No rash noted. Psychiatric: Mood and affect are normal. Speech and behavior are normal.  ____________________________________________   LABS (all labs ordered are listed, but only abnormal results are displayed)  Results for orders placed or performed during the hospital encounter of 06/13/19 (from the past 24 hour(s))  Glucose, capillary     Status: Abnormal   Collection Time: 06/13/19  9:38 AM  Result Value Ref Range   Glucose-Capillary 107 (H) 70 - 99 mg/dL   Comment 1 Notify RN    Comment 2 Document in Chart   Protime-INR     Status: None   Collection Time: 06/13/19 10:01 AM  Result Value Ref Range   Prothrombin Time 12.9 11.4 - 15.2 seconds   INR 1.0 0.8 - 1.2  APTT     Status: None   Collection Time: 06/13/19 10:01 AM  Result Value Ref Range   aPTT 31 24 - 36 seconds  CBC     Status: Abnormal   Collection Time: 06/13/19 10:01 AM  Result Value Ref Range   WBC 6.5 4.0 - 10.5 K/uL   RBC 3.58 (L) 3.87 - 5.11 MIL/uL   Hemoglobin 9.9 (L) 12.0 - 15.0 g/dL   HCT 31.0 (L) 36.0 - 46.0 %   MCV 86.6 80.0 - 100.0 fL   MCH 27.7 26.0 - 34.0 pg   MCHC 31.9 30.0 - 36.0 g/dL   RDW 14.5 11.5 - 15.5 %   Platelets 209 150 - 400 K/uL   nRBC 0.0 0.0 - 0.2 %  Differential     Status: None   Collection Time: 06/13/19 10:01 AM  Result Value Ref Range   Neutrophils Relative % 69 %   Neutro Abs 4.5 1.7 - 7.7 K/uL   Lymphocytes Relative 16 %   Lymphs Abs 1.1 0.7 - 4.0 K/uL   Monocytes Relative 9 %   Monocytes Absolute 0.6 0.1 -  1.0 K/uL   Eosinophils Relative 5 %   Eosinophils Absolute 0.3 0.0 - 0.5 K/uL   Basophils Relative 1 %   Basophils Absolute 0.1 0.0 - 0.1 K/uL  Immature Granulocytes 0 %   Abs Immature Granulocytes 0.02 0.00 - 0.07 K/uL  Comprehensive metabolic panel     Status: Abnormal   Collection Time: 06/13/19 10:01 AM  Result Value Ref Range   Sodium 136 135 - 145 mmol/L   Potassium 4.3 3.5 - 5.1 mmol/L   Chloride 104 98 - 111 mmol/L   CO2 23 22 - 32 mmol/L   Glucose, Bld 101 (H) 70 - 99 mg/dL   BUN 16 8 - 23 mg/dL   Creatinine, Ser 1.00 0.44 - 1.00 mg/dL   Calcium 9.1 8.9 - 10.3 mg/dL   Total Protein 7.0 6.5 - 8.1 g/dL   Albumin 4.0 3.5 - 5.0 g/dL   AST 30 15 - 41 U/L   ALT 33 0 - 44 U/L   Alkaline Phosphatase 65 38 - 126 U/L   Total Bilirubin 0.3 0.3 - 1.2 mg/dL   GFR calc non Af Amer 54 (L) >60 mL/min   GFR calc Af Amer >60 >60 mL/min   Anion gap 9 5 - 15  Urine Drug Screen, Qualitative     Status: None   Collection Time: 06/13/19 11:07 AM  Result Value Ref Range   Tricyclic, Ur Screen NONE DETECTED NONE DETECTED   Amphetamines, Ur Screen NONE DETECTED NONE DETECTED   MDMA (Ecstasy)Ur Screen NONE DETECTED NONE DETECTED   Cocaine Metabolite,Ur [place] NONE DETECTED NONE DETECTED   Opiate, Ur Screen NONE DETECTED NONE DETECTED   Phencyclidine (PCP) Ur S NONE DETECTED NONE DETECTED   Cannabinoid 50 Ng, Ur [place] NONE DETECTED NONE DETECTED   Barbiturates, Ur Screen NONE DETECTED NONE DETECTED   Benzodiazepine, Ur Scrn NONE DETECTED NONE DETECTED   Methadone Scn, Ur NONE DETECTED NONE DETECTED  Urinalysis, Complete w Microscopic     Status: Abnormal   Collection Time: 06/13/19 11:07 AM  Result Value Ref Range   Color, Urine YELLOW (A) YELLOW   APPearance CLEAR (A) CLEAR   Specific Gravity, Urine 1.013 1.005 - 1.030   pH 7.0 5.0 - 8.0   Glucose, UA NEGATIVE NEGATIVE mg/dL   Hgb urine dipstick NEGATIVE NEGATIVE   Bilirubin Urine NEGATIVE NEGATIVE   Ketones, ur NEGATIVE NEGATIVE mg/dL    Protein, ur NEGATIVE NEGATIVE mg/dL   Nitrite NEGATIVE NEGATIVE   Leukocytes,Ua NEGATIVE NEGATIVE   RBC / HPF 0-5 0 - 5 RBC/hpf   WBC, UA 0-5 0 - 5 WBC/hpf   Bacteria, UA NONE SEEN NONE SEEN   Squamous Epithelial / LPF 0-5 0 - 5   Mucus PRESENT    Hyaline Casts, UA PRESENT   SARS Coronavirus 2 (CEPHEID- Performed in [place] hospital lab), Hosp Order     Status: None   Collection Time: 06/13/19 11:07 AM   Specimen: Nasopharyngeal Swab  Result Value Ref Range   SARS Coronavirus 2 NEGATIVE NEGATIVE  Lipid panel     Status: None   Collection Time: 06/13/19 12:58 PM  Result Value Ref Range   Cholesterol 128 0 - 200 mg/dL   Triglycerides 40 <150 mg/dL   HDL 63 >40 mg/dL   Total CHOL/HDL Ratio 2.0 RATIO   VLDL 8 0 - 40 mg/dL   LDL Cholesterol 57 0 - 99 mg/dL   ____________________________________________  EKG My review and personal interpretation at Time: 9:42   Indication: aphasia  Rate: 90  Rhythm: sinus Axis: normal  Other: normal intervals, no stemi ____________________________________________  RADIOLOGY  I personally reviewed all radiographic images ordered to evaluate for the above acute  complaints and reviewed radiology reports and findings.  These findings were personally discussed with the patient.  Please see medical record for radiology report.  ____________________________________________   PROCEDURES  Procedure(s) performed:  Procedures    Critical Care performed: no ____________________________________________   INITIAL IMPRESSION / ASSESSMENT AND PLAN / ED COURSE  Pertinent labs & imaging results that were available during my care of the patient were reviewed by me and considered in my medical decision making (see chart for details).   DDX: cva, tia, hypoglycemia, dehydration, electrolyte abnormality, dissection, sepsis   DOSHA BROSHEARS is a 78 y.o. who presents to the ED with was as described above certainly concerning for CVA.  Blood will be  sent for blood differential.  CT imaging will be ordered.  Patient outside of the window for TPA.  The patient will be placed on continuous pulse oximetry and telemetry for monitoring.  Laboratory evaluation will be sent to evaluate for the above complaints.  Ct head without acute abnormality.  Based on presentation I do believe she will require admission for further medical evaluation.  Have discussed with the patient and available family all diagnostics and treatments performed thus far and all questions were answered to the best of my ability. The patient demonstrates understanding and agreement with plan.       The patient was evaluated in Emergency Department today for the symptoms described in the history of present illness. He/she was evaluated in the context of the global COVID-19 pandemic, which necessitated consideration that the patient might be at risk for infection with the SARS-CoV-2 virus that causes COVID-19. Institutional protocols and algorithms that pertain to the evaluation of patients at risk for COVID-19 are in a state of rapid change based on information released by regulatory bodies including the CDC and federal and state organizations. These policies and algorithms were followed during the patient's care in the ED.  As part of my medical decision making, I reviewed the following data within the [place] notes reviewed and incorporated, Labs reviewed, notes from prior ED visits and [place] Controlled Substance Database   ____________________________________________   FINAL CLINICAL IMPRESSION(S) / ED DIAGNOSES  Final diagnoses:  Aphasia      NEW MEDICATIONS STARTED DURING THIS VISIT:  New Prescriptions   No medications on file     Note:  This document was prepared using Dragon voice recognition software and may include unintentional dictation errors.    Merlyn Lot, MD 06/13/19 1329

## 2019-06-13 NOTE — ED Notes (Signed)
Patient transported to Ultrasound 

## 2019-06-14 LAB — GLUCOSE, CAPILLARY
Glucose-Capillary: 84 mg/dL (ref 70–99)
Glucose-Capillary: 59 mg/dL — ABNORMAL LOW (ref 70–99)
Glucose-Capillary: 82 mg/dL (ref 70–99)
Glucose-Capillary: 78 mg/dL (ref 70–99)

## 2019-06-14 LAB — HEMOGLOBIN A1C
Hgb A1c MFr Bld: 5.4 % (ref 4.8–5.6)
Mean Plasma Glucose: 108 mg/dL

## 2019-06-14 NOTE — TOC Progression Note (Signed)
Transition of Care [facility]) - Progression Note    Patient Details  Name: Melody Ochoa MRN: [id number] Date of Birth: 1941/11/27  Transition of Care [facility]) CM/SW Contact  Shelbie Hutching, RN Phone Number: 06/14/2019, 4:13 PM  Clinical Narrative:    [facility] has offered a bed and patient has accepted.  [facility] has a 4 pm cutoff for admissions so [facility] will [facility].  EMS will provide transportation, patient's husband is aware of plan of care.    Expected Discharge Plan: Skilled Nursing Facility Barriers to Discharge: SNF Pending bed offer  Expected Discharge Plan and Services Expected Discharge Plan: [place] Choice: [place]                                         Social Determinants of Health (SDOH) Interventions    Readmission Risk Interventions No flowsheet data found.

## 2019-06-14 NOTE — Progress Notes (Signed)
OT Cancellation Note  Patient Details Name: Melody Ochoa MRN: [id number] DOB: 02-09-1941   Cancelled Treatment:    Reason Eval/Treat Not Completed: Other (comment). Pt with nursing, will re-attempt OT evaluation at later time when pt is available and medically appropriate.  Jeni Salles, MPH, MS, OTR/L ascom [phone] 06/14/19, 9:35 AM

## 2019-06-14 NOTE — Evaluation (Signed)
Speech Language Pathology Evaluation Patient Details Name: Melody Ochoa MRN: [id number] DOB: 02/22/1941 Today's Date: 06/14/2019 Time: 6599-3570 SLP Time Calculation (min) (ACUTE ONLY): 41 min  Problem List:  Patient Active Problem List   Diagnosis Date Noted  . Left shoulder pain 05/04/2019  . Anemia 03/19/2019  . Long term current use of opiate analgesic 01/09/2019  . Foraminal stenosis of lumbar region 11/14/2018  . Chronic pain syndrome 09/12/2018  . SOB (shortness of breath) on exertion 08/11/2018  . Dysphagia 08/11/2018  . Cough 06/30/2018  . Rash 01/16/2018  . Hyperlipidemia 10/18/2017  . History of lumbar surgery 10/04/2017  . Depression, recurrent ([place]) 09/27/2017  . Pulmonary nodules 09/27/2017  . History of stroke 09/12/2017  . Stroke (cerebrum) ([place]) 08/10/2017  . Primary osteoarthritis of left hip 02/28/2017  . Strain of left hip 02/28/2017  . B12 deficiency 12/21/2016  . Generalized OA 05/13/2016  . Status post total replacement of right hip 04/22/2016  . Personal history of colonic polyps   . Greater trochanteric bursitis of both hips 01/09/2016  . Prediabetes 08/04/2015  . Thoracic aortic atherosclerosis ([place]) 06/16/2015  . 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  . UTI (lower urinary tract infection) 11/22/2014  . Low back pain 11/22/2014  . Spondylolisthesis of lumbar region 11/18/2014  . Lumbar radiculopathy 08/21/2014  . Degeneration of lumbar or lumbosacral intervertebral disc 08/21/2014  . Left hip pain 08/21/2014  . Osteopenia 06/23/2014  . Major depression in remission ([place]) 06/23/2014  . Anxiety 06/23/2014  . Menopause 06/23/2014   Past Medical History:  Past Medical History:  Diagnosis Date  . Arthritis   . Atheromatous plaque 10/18/2017  . Back pain   . Cancer ([place])    skin  . Depression   . Diverticulosis   . Dizziness    patient had episode of dizziness when came in room. hx past no dx  . GERD (gastroesophageal reflux disease)    . Heart murmur   . Hyperlipidemia 10/18/2017  . PONV (postoperative nausea and vomiting)    yrs ago  . Stroke [facility])    Past Surgical History:  Past Surgical History:  Procedure Laterality Date  . BACK SURGERY  13  . BREAST BIOPSY Bilateral    cores "years ago"  . BREAST EXCISIONAL BIOPSY Left 90's  . CATARACT EXTRACTION W/PHACO Left 12/20/2018   Procedure: CATARACT EXTRACTION PHACO AND INTRAOCULAR LENS PLACEMENT ([place]) LEFT TOPICAL;  Surgeon: Leandrew Koyanagi, MD;  Location: [place];  Service: Ophthalmology;  Laterality: Left;  . CATARACT EXTRACTION W/PHACO Right 01/24/2019   Procedure: CATARACT EXTRACTION PHACO AND INTRAOCULAR LENS PLACEMENT ([place]) RIGHT;  Surgeon: Leandrew Koyanagi, MD;  Location: [place];  Service: Ophthalmology;  Laterality: Right;  . CERVICAL DISC SURGERY  09  . COLONOSCOPY WITH PROPOFOL N/A 01/13/2016   Procedure: COLONOSCOPY WITH PROPOFOL;  Surgeon: [name], MD;  Location: [facility] ENDOSCOPY;  Service: Endoscopy;  Laterality: N/A;  . FACELIFT  94  . LOOP RECORDER INSERTION N/A 08/23/2017   Procedure: LOOP RECORDER INSERTION;  Surgeon: Thompson Grayer, MD;  Location: [place] CV LAB;  Service: Cardiovascular;  Laterality: N/A;  . TEE WITHOUT CARDIOVERSION N/A 08/12/2017   Procedure: TRANSESOPHAGEAL ECHOCARDIOGRAM (TEE);  Surgeon: Sueanne Margarita, MD;  Location: [facility] ENDOSCOPY;  Service: Cardiovascular;  Laterality: N/A;  . TONSILLECTOMY    . TOTAL HIP ARTHROPLASTY Right 04/22/2016   Procedure: TOTAL HIP ARTHROPLASTY;  Surgeon: Corky Mull, MD;  Location: [facility] ORS;  Service: Orthopedics;  Laterality: Right;   HPI:  Per admitting H&P: "78 y.o. female with pertinent past medical history of normocytic anemia, chronic pain syndrome, hyperlipidemia, CVA s/p tPA (9/18), B12 deficiency, lumbar DDD, depression and pulmonary fibrosis presenting to the ED with chief complaints of word finding difficulty, slurred speech and being "off balance". Patient  reports that she was feeling unsteady on her feet and somewhat dizzy yesterday prior to going to bed at wround 8:30 pm, she denies any other symptoms. However when she woke up this morning at around 6 am, her husband noticed that her speech was "off" sounding gibberish, slurred and unable to find her words. She still had difficulty walking and moving around. Denies associated altered sensorium, cranial nerve deficit, seizures, focal motor or sensory deficits, diplopia, nausea or vomiting, syncope or LOC, paresthesia (numbness, tingling, pins-and-needles sensation) or a heavy feeling in an extremity. On arrival to the ED, he was afebrile with blood pressure 146/69 mm Hg and pulse rate 92 beats/min. She was initially noted with right facial droop which was not apparent on my exam; she was alert and oriented x4, and he did not demonstrate any memory deficits.  Initial NIH stroke scale 0.  Noncontrast CT head was obtained and showed no acute intracranial abnormality.  Labs unremarkable."    Assessment / Plan / Recommendation Clinical Impression  This very pleasant 78 y/o female presents with mild to moderate receptive/expressive aphasia and very mild dysarthria. Spontaneous speech is 100% intelligible and mildly dysfluent due to word finding difficulties at the conversational level. Occasional semantic paraphasias noted, ie-computer for TV and vice versa. No difficulty communicating basic needs and wants; however, demonstrated difficulty w/more complex verbal expression. In addition noted difficulty with mod complex auditory comprehension y/n questions; however no difficulty following mod complex 2 step commands.  Very mild dysarthria c/b mild imprecision of articulatory contacts which may be more likely due to lethargy than CVA, no deficits noted with respiration, motor planning, or resonance. Oral mech exam WFL. Pt also reports her writing has become more difficult over the past few years, reporting it is  illegible by the time she gets to the end of her sentence.  Pt reports she did not get any OT tx following her 1st CVA, no family currently available for further tx history. Reading appears [facility] for basic tasks assessed; however rec more thorough assessment for more complex functional tasks. Pt and nsg report toleration of current Regular diet with thin liquids with no overt s/s aspiration. Pt denies any difficulty swallowing previously at home. SLP observed pt take approx 6 pills followed by several sips of water via straw without difficulty. Rec Outpatient SLP tx at d/c for remediation of receptive/expressive aphasia and improve functional communication skills to prior level of function. Discussed rec w/pt, and pt reported she will consider SLP tx at d/c but at this time she is not sure if she wants to participate. SLP to f/u with pt education re: compensatory strategies to improve word finding skills, to discuss recommendations and pt intentions for future SLP tx at d/c.    SLP Assessment  SLP Recommendation/Assessment: Patient needs continued Speech Lanaguage Pathology Services SLP Visit Diagnosis: Aphasia (R47.01)    Follow Up Recommendations  Outpatient SLP    Frequency and Duration Other (Comment)  1 week      SLP Evaluation Cognition  Overall Cognitive Status: No family/caregiver present to determine baseline cognitive functioning Arousal/Alertness: Lethargic Orientation Level: Oriented X4 Attention: Focused;Sustained Focused Attention: Appears intact Sustained Attention: Appears intact Memory: Appears intact Awareness: Appears  intact Problem Solving: Impaired(difficulty with more complex problem solving re: ADL's) Problem Solving Impairment: Verbal complex Executive Function: Organizing;Self Monitoring;Self Correcting Organizing: Impaired(Mild to moderate) Organizing Impairment: Verbal complex Self Monitoring: Impaired Self Monitoring Impairment: Verbal basic Self Correcting:  Impaired Self Correcting Impairment: Verbal basic Safety/Judgment: Appears intact       Comprehension  Auditory Comprehension Overall Auditory Comprehension: Impaired Yes/No Questions: Impaired(2/4 mod complex y/n questions) Commands: Within Functional Limits Conversation: Simple Other Conversation Comments: Mild to moderate word finding difficulty seen at the conversational level EffectiveTechniques: Extra processing time;Other (Comment)(Verbal cues for description) Visual Recognition/Discrimination Discrimination: Within Function Limits Reading Comprehension Reading Status: Within funtional limits(for basic tasks assessed)    Expression Expression Primary Mode of Expression: Verbal Verbal Expression Overall Verbal Expression: Impaired(May be a degree of impairment at baseline, no family availab) Initiation: No impairment Level of Generative/Spontaneous Verbalization: Conversation Repetition: No impairment Naming: Impairment Responsive: 76-100% accurate Confrontation: Within functional limits Divergent: 50-74% accurate(named 8 animals in 1 minute) Verbal Errors: Semantic paraphasias(minimal) Pragmatics: No impairment Interfering Components: Premorbid deficit(possible) Effective Techniques: Sentence completion   Oral / Motor  Oral Motor/Sensory Function Overall Oral Motor/Sensory Function: Within functional limits Motor Speech Overall Motor Speech: Impaired Respiration: Within functional limits Articulation: Impaired Level of Impairment: Conversation Intelligibility: Intelligible Motor Planning: Witnin functional limits Effective Techniques: Increased vocal intensity;Over-articulate   GO                    Olen Eaves, MA, CCC-SLP 06/14/2019, 10:19 AM

## 2019-06-14 NOTE — NC FL2 (Signed)
[place] LEVEL OF CARE SCREENING TOOL     IDENTIFICATION  Patient Name: Melody Ochoa Birthdate: 1941-08-03 Sex: female Admission Date (Current Location): 06/13/2019  [place] and [place] Number:  Engineering geologist and Address:  [facility], [address], [place], [place] [address]      Provider Number: [id number]  Attending Physician Name and Address:  Vaughan Basta, *  Relative Name and Phone Number:  Nene Aranas [phone]    Current Level of Care: Hospital Recommended Level of Care: [place] Prior Approval Number:    Date Approved/Denied:   PASRR Number: [id number]  Discharge Plan: SNF    Current Diagnoses: Patient Active Problem List   Diagnosis Date Noted  . Left shoulder pain 05/04/2019  . Anemia 03/19/2019  . Long term current use of opiate analgesic 01/09/2019  . Foraminal stenosis of lumbar region 11/14/2018  . Chronic pain syndrome 09/12/2018  . SOB (shortness of breath) on exertion 08/11/2018  . Dysphagia 08/11/2018  . Cough 06/30/2018  . Rash 01/16/2018  . Hyperlipidemia 10/18/2017  . History of lumbar surgery 10/04/2017  . Depression, recurrent ([place]) 09/27/2017  . Pulmonary nodules 09/27/2017  . History of stroke 09/12/2017  . Stroke (cerebrum) ([place]) 08/10/2017  . Primary osteoarthritis of left hip 02/28/2017  . Strain of left hip 02/28/2017  . B12 deficiency 12/21/2016  . Generalized OA 05/13/2016  . Status post total replacement of right hip 04/22/2016  . Personal history of colonic polyps   . Greater trochanteric bursitis of both hips 01/09/2016  . Prediabetes 08/04/2015  . Thoracic aortic atherosclerosis ([place]) 06/16/2015  . 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  . UTI (lower urinary tract infection) 11/22/2014  . Low back pain 11/22/2014  . Spondylolisthesis of lumbar region 11/18/2014  . Lumbar radiculopathy 08/21/2014  . Degeneration of lumbar or lumbosacral  intervertebral disc 08/21/2014  . Left hip pain 08/21/2014  . Osteopenia 06/23/2014  . Major depression in remission ([place]) 06/23/2014  . Anxiety 06/23/2014  . Menopause 06/23/2014    Orientation RESPIRATION BLADDER Height & Weight     Self, Time, Situation, Place  Normal Continent Weight: 91.5 kg Height:  5\' 4"  (162.6 cm)  BEHAVIORAL SYMPTOMS/MOOD NEUROLOGICAL BOWEL NUTRITION STATUS      Continent Diet(Regular)  AMBULATORY STATUS COMMUNICATION OF NEEDS Skin   Extensive Assist Verbally Normal                       Personal Care Assistance Level of Assistance  Bathing, Feeding, Dressing Bathing Assistance: Limited assistance Feeding assistance: Independent Dressing Assistance: Limited assistance     Functional Limitations Info             SPECIAL CARE FACTORS FREQUENCY  PT (By licensed PT), OT (By licensed OT), Speech therapy     PT Frequency: 5X per week OT Frequency: 5 times per week     Speech Therapy Frequency: hospital follow up speech eval for aphasia      Contractures Contractures Info: Not present    Additional Factors Info  Code Status, Allergies Code Status Info: Full Allergies Info: Aleve, aspirin, celebrex, effexor, gabapentin, hydrocodone, ibuprofen, tape, vioxx, bandaids, tramadol           Current Medications (06/14/2019):  This is the current hospital active medication list Current Facility-Administered Medications  Medication Dose Route Frequency Provider Last Rate Last Dose  . 0.9 %  sodium chloride infusion   Intravenous Continuous Lang Snow, NP [phone]  mL/hr at 06/14/19 7062    . acetaminophen (TYLENOL) tablet 650 mg  650 mg Oral Q4H PRN Lang Snow, NP   650 mg at 06/14/19 0654   Or  . acetaminophen (TYLENOL) solution 650 mg  650 mg Per Tube Q4H PRN Lang Snow, NP       Or  . acetaminophen (TYLENOL) suppository 650 mg  650 mg Rectal Q4H PRN Lang Snow, NP      . albuterol (PROVENTIL)  (2.5 MG/3ML) 0.083% nebulizer solution 2.5 mg  2.5 mg Inhalation Q6H PRN Lang Snow, NP      . atorvastatin (LIPITOR) tablet 80 mg  80 mg Oral q1800 Lang Snow, NP   80 mg at 06/13/19 1650  . buPROPion (WELLBUTRIN XL) 24 hr tablet 300 mg  300 mg Oral Daily Lang Snow, NP   300 mg at 06/14/19 3762  . clopidogrel (PLAVIX) tablet 75 mg  75 mg Oral Daily Lang Snow, NP   75 mg at 06/14/19 8315  . DULoxetine (CYMBALTA) DR capsule 60 mg  60 mg Oral BID Lang Snow, NP   60 mg at 06/14/19 1761  . enoxaparin (LOVENOX) injection 40 mg  40 mg Subcutaneous Q24H Lang Snow, NP   40 mg at 06/13/19 2234  . famotidine (PEPCID) tablet 20 mg  20 mg Oral QHS Lang Snow, NP   20 mg at 06/13/19 2234  . ferrous sulfate tablet 325 mg  325 mg Oral Daily Lang Snow, NP   325 mg at 06/14/19 6073  . fluticasone (FLONASE) 50 MCG/ACT nasal spray 1 spray  1 spray Each Nare Daily PRN Lang Snow, NP      . lidocaine (LIDODERM) 5 % 1 patch  1 patch Transdermal Q24H Lang Snow, NP      . pregabalin (LYRICA) capsule 50 mg  50 mg Oral TID Lang Snow, NP   50 mg at 06/14/19 0907  . senna-docusate (Senokot-S) tablet 1 tablet  1 tablet Oral QHS PRN Lang Snow, NP      . triamcinolone cream (KENALOG) 0.1 % 1 application  1 application Topical QID Lang Snow, NP   1 application at 71/06/26 2235     Discharge Medications: Please see discharge summary for a list of discharge medications.  Relevant Imaging Results:  Relevant Lab Results:   Additional Information SS# [id number]  Shelbie Hutching, RN

## 2019-06-14 NOTE — Progress Notes (Signed)
Physical Therapy Treatment Patient Details Name: Melody Ochoa MRN: [id number] DOB: 09-16-1941 Today's Date: 06/14/2019    History of Present Illness From MD H&P: Pt is a 78 y.o. female with pertinent past medical history of normocytic anemia, chronic pain syndrome, hyperlipidemia, CVA s/p tPA (9/18), B12 deficiency, lumbar DDD, depression and pulmonary fibrosis presenting to the ED with chief complaints of word finding difficulty, slurred speech and being "off balance".  Noncontrast CT head was obtained and showed no acute intracranial abnormality.  Labs unremarkable.  Assessment includes: Speech disturbances, MRI did confirm acute CVA, HLD, chronic pain syndrome, pulmonary fibrosis, and depression.    PT Comments    Pt presents with deficits in strength, transfers, mobility, gait, balance, and activity tolerance.  Pt required min A with transfers even with cues for sequencing this session.  Pt required min-mod A to prevent posterior LOB while performing below standing balance activities with noted decrease in overall stability compared to previous session.  Pt was able to amb 60' with a RW and min A for stability and to help guide the RW due to pt striking obstacles on the left.  Per pt she has a history of left eye deficits from childhood but even with cues to scan her environment the pt continued to strike obstacles with the RW.  Pt is at a high risk for falls at this time and would not be safe to return to her prior living situation.  Pt will benefit from PT services in a SNF setting upon discharge to safely address above deficits for decreased caregiver assistance and eventual return to PLOF.     Follow Up Recommendations  SNF     Equipment Recommendations  None recommended by PT    Recommendations for Other Services       Precautions / Restrictions Precautions Precautions: Fall Restrictions Weight Bearing Restrictions: No    Mobility  Bed Mobility Overal bed mobility:  Modified Independent             General bed mobility comments: Extra time and effort during sup to sit but no physical assistance needed  Transfers Overall transfer level: Needs assistance Equipment used: Rolling walker (2 wheeled) Transfers: Sit to/from Stand Sit to Stand: Min assist         General transfer comment: Min A with verbal cues for sequencing  Ambulation/Gait Ambulation/Gait assistance: Min assist Gait Distance (Feet): 60 Feet Assistive device: Rolling walker (2 wheeled) Gait Pattern/deviations: Step-through pattern;Decreased step length - right;Decreased step length - left;Drifts right/left Gait velocity: decreased   General Gait Details: Significant L-sided neglect during amb with the RW with min A for stability and to guide the RW during amb this session   Stairs             Wheelchair Mobility    Modified Rankin (Stroke Patients Only)       Balance Overall balance assessment: Needs assistance Sitting-balance support: Feet supported;Single extremity supported Sitting balance-Leahy Scale: Fair Sitting balance - Comments: Occasional posterior instability in sitting that the pt could correct with cues   Standing balance support: No upper extremity supported;Bilateral upper extremity supported;During functional activity Standing balance-Leahy Scale: Poor Standing balance comment: Min-mod A required several times during the session to prevent posterior LOB in standing                            Cognition Arousal/Alertness: Awake/alert Behavior During Therapy: WFL for tasks assessed/performed Overall Cognitive Status:  No family/caregiver present to determine baseline cognitive functioning                                        Exercises Total Joint Exercises Ankle Circles/Pumps: AROM;Strengthening;Both;10 reps;5 reps Quad Sets: Strengthening;Both;5 reps;10 reps Gluteal Sets: Strengthening;Both;10 reps Hip  ABduction/ADduction: Both;10 reps;Strengthening Straight Leg Raises: Strengthening;Both;10 reps Long Arc Quad: Strengthening;Both;10 reps;15 reps Knee Flexion: Strengthening;Both;10 reps;15 reps Marching in Standing: AROM;Both;10 reps Other Exercises Other Exercises: Static balance training without UE support with feet apart and semi-tandem Other Exercises: Dynamic standing balance training with feet apart with reaching outside BOS    General Comments        Pertinent Vitals/Pain Pain Assessment: No/denies pain    Home Living                      Prior Function            PT Goals (current goals can now be found in the care plan section) Progress towards PT goals: Not progressing toward goals - comment(Pt with decline in stability/functional mobility)    Frequency    7X/week      PT Plan Frequency needs to be updated;Discharge plan needs to be updated    Co-evaluation              AM-PAC PT "6 Clicks" Mobility   Outcome Measure  Help needed turning from your back to your side while in a flat bed without using bedrails?: None Help needed moving from lying on your back to sitting on the side of a flat bed without using bedrails?: None Help needed moving to and from a bed to a chair (including a wheelchair)?: A Little Help needed standing up from a chair using your arms (e.g., wheelchair or bedside chair)?: A Little Help needed to walk in hospital room?: A Lot Help needed climbing 3-5 steps with a railing? : A Lot 6 Click Score: 18    End of Session Equipment Utilized During Treatment: Gait belt Activity Tolerance: Patient tolerated treatment well Patient left: in chair;with call bell/phone within reach;with chair alarm set Nurse Communication: Mobility status PT Visit Diagnosis: Unsteadiness on feet (R26.81);Difficulty in walking, not elsewhere classified (R26.2);Muscle weakness (generalized) (M62.81)     Time: 6967-8938 PT Time Calculation (min)  (ACUTE ONLY): 38 min  Charges:  $Gait Training: 8-22 mins $Therapeutic Exercise: 23-37 mins                     D. Scott Ordell Prichett PT, DPT 06/14/19, 12:04 PM

## 2019-06-14 NOTE — Evaluation (Signed)
Occupational Therapy Evaluation Patient Details Name: Melody Ochoa MRN: [id number] DOB: [birth year] Today's Date: 06/14/2019    History of Present Illness From MD H&P: Pt is a 78 y.o. female with pertinent past medical history of normocytic anemia, chronic pain syndrome, hyperlipidemia, CVA s/p tPA (9/18), B12 deficiency, lumbar DDD, depression and pulmonary fibrosis presenting to the ED with chief complaints of word finding difficulty, slurred speech and being "off balance".  Noncontrast CT head was obtained and showed no acute intracranial abnormality.  Labs unremarkable.  Assessment includes: Speech disturbances, MRI did confirm acute CVA, HLD, chronic pain syndrome, pulmonary fibrosis, and depression.   Clinical Impression   Pt seen for OT evaluation this date. Prior to hospital admission, pt was indep with ADL, ambulating with SPC.  Pt lives with her spouse.  Currently pt demonstrates impairments in strength, coordination, balance, and cognition requiring increased assist (min-mod) for ADL and Min A for functional mobility.  Pt would benefit from skilled OT to address noted impairments and functional limitations (see below for any additional details) in order to maximize safety and independence while minimizing falls risk and caregiver burden.  Upon hospital discharge, recommend pt discharge to SNF.    Follow Up Recommendations  SNF;Supervision - Intermittent    Equipment Recommendations  3 in 1 bedside commode    Recommendations for Other Services       Precautions / Restrictions Precautions Precautions: Fall Restrictions Weight Bearing Restrictions: No      Mobility Bed Mobility Overal bed mobility: Modified Independent             General bed mobility comments: Extra time and effort during sup to sit but no physical assistance needed  Transfers Overall transfer level: Needs assistance Equipment used: Rolling walker (2 wheeled) Transfers: Sit to/from Stand Sit to  Stand: Min assist         General transfer comment: VC for hand placement    Balance Overall balance assessment: Needs assistance Sitting-balance support: Feet supported;Single extremity supported Sitting balance-Leahy Scale: Fair Sitting balance - Comments: Occasional posterior instability in sitting that the pt could correct with cues   Standing balance support: No upper extremity supported;Bilateral upper extremity supported;During functional activity Standing balance-Leahy Scale: Poor Standing balance comment: Min-mod A required several times during the session to prevent posterior LOB in standing                           ADL either performed or assessed with clinical judgement   ADL Overall ADL's : Needs assistance/impaired Eating/Feeding: Sitting;Independent   Grooming: Sitting;Independent   Upper Body Bathing: Sitting;Minimal assistance   Lower Body Bathing: Sit to/from stand;Moderate assistance   Upper Body Dressing : Sitting;Min guard   Lower Body Dressing: Sit to/from stand;Moderate assistance   Toilet Transfer: RW;Minimal assistance;BSC;Ambulation                   Vision Baseline Vision/History: (legally blind in L eye from childhood) Patient Visual Report: No change from baseline Vision Assessment?: Vision impaired- to be further tested in functional context     Perception     Praxis      Pertinent Vitals/Pain Pain Assessment: No/denies pain     Hand Dominance Right   Extremity/Trunk Assessment Upper Extremity Assessment Upper Extremity Assessment: Generalized weakness;LUE deficits/detail(equal strength and pinch bilaterally) LUE Deficits / Details: per pt report, recent hx of fall involving L shoulder, since then has seen an orthopedic MD and received shots.  Causes pain with horizontal adduction and internal/external rotation of L shoulder   Lower Extremity Assessment Lower Extremity Assessment: Defer to PT  evaluation;Generalized weakness;RLE deficits/detail;LLE deficits/detail RLE Sensation: WNL RLE Coordination: WNL LLE Sensation: WNL LLE Coordination: WNL       Communication Communication Communication: No difficulties   Cognition Arousal/Alertness: Awake/alert Behavior During Therapy: WFL for tasks assessed/performed Overall Cognitive Status: No family/caregiver present to determine baseline cognitive functioning                                 General Comments: pleasant, follows commands with cues   General Comments       Exercises  Other Exercises Other Exercises: pt instructed in falls prevention strategies, functional transfer training in connection with ADL tasks   Shoulder Instructions      Home Living Family/patient expects to be discharged to:: Private residence Living Arrangements: Spouse/significant other Available Help at Discharge: Family;Available 24 hours/day Type of Home: House Home Access: Stairs to enter [employer] of Steps: 4 Entrance Stairs-Rails: Can reach both;Left;Right Home Layout: One level     Bathroom Shower/Tub: Occupational psychologist: Standard     Home Equipment: Environmental consultant - 2 wheels;Cane - single point;Bedside commode;Shower seat - built in      Lives With: Spouse    Prior Functioning/Environment Level of Independence: Independent with assistive device(s)        Comments: Mod Ind with amb limited community distances with a SPC, no fall history, Ind with ADLs        OT Problem List: Decreased strength;Decreased safety awareness;Impaired UE functional use      OT Treatment/Interventions: Self-care/ADL training;Therapeutic exercise;Therapeutic activities;DME and/or AE instruction;Patient/family education;Balance training    OT Goals(Current goals can be found in the care plan section) Acute Rehab OT Goals Patient Stated Goal: To improve my balance so I can walk on the beach again OT Goal  Formulation: With patient Time For Goal Achievement: 06/28/19 Potential to Achieve Goals: Good ADL Goals Pt Will Perform Upper Body Dressing: sitting;with supervision Pt Will Perform Lower Body Dressing: sit to/from stand;with min assist Pt Will Transfer to Toilet: with min guard assist;ambulating;regular height toilet(LRAD for amb)  OT Frequency: Min 2X/week   Barriers to D/C:            Co-evaluation              AM-PAC OT "6 Clicks" Daily Activity     Outcome Measure Help from another person eating meals?: None Help from another person taking care of personal grooming?: None Help from another person toileting, which includes using toliet, bedpan, or urinal?: A Little Help from another person bathing (including washing, rinsing, drying)?: A Lot Help from another person to put on and taking off regular upper body clothing?: A Little Help from another person to put on and taking off regular lower body clothing?: A Lot 6 Click Score: 18   End of Session Nurse Communication: Other (comment)(pt prefers female staff to assist with toileting/dressing/bathing needs)  Activity Tolerance: Patient tolerated treatment well Patient left: in bed;with call bell/phone within reach;with bed alarm set;with SCD's reapplied  OT Visit Diagnosis: Other abnormalities of gait and mobility (R26.89);Muscle weakness (generalized) (M62.81);Other symptoms and signs involving cognitive function                Time: 9476-5465 OT Time Calculation (min): 26 min Charges:  OT General Charges $OT Visit: 1 Visit  OT Evaluation $OT Eval Low Complexity: 1 Low OT Treatments $Therapeutic Activity: 8-22 mins  Jeni Salles, MPH, MS, OTR/L ascom [phone] 06/14/19, 3:48 PM

## 2019-06-14 NOTE — Progress Notes (Signed)
MD notified of new Modified NIH score. Will continue to monitor.

## 2019-06-14 NOTE — Progress Notes (Signed)
[facility] NAME: Melody Ochoa    MR#:  [id number]  DATE OF BIRTH:  Jan 12, 1941  SUBJECTIVE:  CHIEF COMPLAINT:   Chief Complaint  Patient presents with  . Aphasia    with slurred speech and imbalance and noted to have acute stroke. REVIEW OF SYSTEMS:  CONSTITUTIONAL: No fever, fatigue or weakness.  EYES: No blurred or double vision.  EARS, NOSE, AND THROAT: No tinnitus or ear pain.  RESPIRATORY: No cough, shortness of breath, wheezing or hemoptysis.  CARDIOVASCULAR: No chest pain, orthopnea, edema.  GASTROINTESTINAL: No nausea, vomiting, diarrhea or abdominal pain.  GENITOURINARY: No dysuria, hematuria.  ENDOCRINE: No polyuria, nocturia,  HEMATOLOGY: No anemia, easy bruising or bleeding SKIN: No rash or lesion. MUSCULOSKELETAL: No joint pain or arthritis.   NEUROLOGIC: No tingling, numbness, weakness.  PSYCHIATRY: No anxiety or depression.   ROS  DRUG ALLERGIES:   Allergies  Allergen Reactions  . Aleve [Naproxen Sodium] Other (See Comments)    Gi upset  . Aspirin Other (See Comments)    Stomach pain-aggravates diverticulosis  . Celebrex [Celecoxib] Other (See Comments)    Dizziness   . Effexor [Venlafaxine] Other (See Comments)    Hot flashes   . Gabapentin Other (See Comments)    "Night terrors"  . Hydrocodone-Homatropine Diarrhea  . Ibuprofen Other (See Comments)    Gi upset   . Tape Itching and Other (See Comments)    Itchy blisters  . Vioxx [Rofecoxib] Other (See Comments)    Gi upset  . Other Rash and Other (See Comments)    bandaides  . Tramadol Other (See Comments)    hallucinations    VITALS:  Blood pressure (!) 162/83, pulse 72, temperature 97.8 F (36.6 C), temperature source Oral, resp. rate 18, height 5\' 4"  (1.626 m), weight 91.5 kg, SpO2 100 %.  PHYSICAL EXAMINATION:  GENERAL:  78 y.o.-year-old patient lying in the bed with no acute distress.  EYES: Pupils equal, round, reactive to light  and accommodation. No scleral icterus. Extraocular muscles intact.  HEENT: Head atraumatic, normocephalic. Oropharynx and nasopharynx clear.  NECK:  Supple, no jugular venous distention. No thyroid enlargement, no tenderness.  LUNGS: Normal breath sounds bilaterally, no wheezing, rales,rhonchi or crepitation. No use of accessory muscles of respiration.  CARDIOVASCULAR: S1, S2 normal. No murmurs, rubs, or gallops.  ABDOMEN: Soft, nontender, nondistended. Bowel sounds present. No organomegaly or mass.  EXTREMITIES: No pedal edema, cyanosis, or clubbing.  NEUROLOGIC: Cranial nerves II through XII are intact. Muscle strength 5/5 in all extremities. Sensation intact. Gait not checked.  PSYCHIATRIC: The patient is alert and oriented x 3.  SKIN: No obvious rash, lesion, or ulcer.   Physical Exam LABORATORY PANEL:   CBC Recent Labs  Lab 06/13/19 1001  WBC 6.5  HGB 9.9*  HCT 31.0*  PLT 209   ------------------------------------------------------------------------------------------------------------------  Chemistries  Recent Labs  Lab 06/13/19 1001  NA 136  K 4.3  CL 104  CO2 23  GLUCOSE 101*  BUN 16  CREATININE 1.00  CALCIUM 9.1  AST 30  ALT 33  ALKPHOS 65  BILITOT 0.3   ------------------------------------------------------------------------------------------------------------------  Cardiac Enzymes No results for input(s): TROPONINI in the last 168 hours. ------------------------------------------------------------------------------------------------------------------  RADIOLOGY:  Ct Head Wo Contrast  Result Date: 06/13/2019 CLINICAL DATA:  Mental status changes since last evening. Dizziness. EXAM: CT HEAD WITHOUT CONTRAST TECHNIQUE: Contiguous axial images were obtained from the base of the skull through the vertex without intravenous contrast. COMPARISON:  04/11/2018 FINDINGS: Brain: Stable age related cerebral atrophy, ventriculomegaly and periventricular white matter  disease. No extra-axial fluid collections are identified. No CT findings for acute hemispheric infarction or intracranial hemorrhage. No mass lesions. Remote left occipital infarct. The brainstem and cerebellum are normal. Vascular: Stable vascular calcifications. No definite aneurysm or hyperdense vessels. Skull: No skull fracture or bone lesions. Sinuses/Orbits: The paranasal sinuses and mastoid air cells are clear. The globes are intact. Other: No scalp lesions or hematoma. IMPRESSION: 1. Stable age related cerebral atrophy, ventriculomegaly and periventricular white matter disease. 2. Remote occipital infarct on the left. 3. No acute intracranial findings or mass lesion. Electronically Signed   By: Marijo Sanes M.D.   On: 06/13/2019 10:44   Mr Brain Wo Contrast  Result Date: 06/13/2019 CLINICAL DATA:  Acute presentation with speech disturbance. History of recent stroke. EXAM: MRI HEAD WITHOUT CONTRAST TECHNIQUE: Multiplanar, multiecho pulse sequences of the brain and surrounding structures were obtained without intravenous contrast. COMPARISON:  Head CT 06/13/2019 FINDINGS: Brain: Diffusion imaging shows a 2.5 Cm region of acute infarction in the right posterior frontal region, cortical and subcortical. No mass effect or hemorrhage. Brainstem is normal. There is an old small vessel infarction in the inferior left cerebellum. There are a few old small vessel infarctions in the superior cerebellum. Cerebral hemispheres show an old infarction at the left parieto-occipital junction affecting the cortical and subcortical brain and mild chronic small-vessel ischemic change the white matter on both sides. No mass lesion, hemorrhage, hydrocephalus or extra-axial collection. Vascular: Major vessels at the base of the brain show flow. Skull and upper cervical spine: Negative Sinuses/Orbits: Clear/normal Other: None IMPRESSION: 2.5 cm region of acute infarction in the right posterior frontal cortical and subcortical  brain. No mass effect or hemorrhage. Old left parieto-occipital cortical and subcortical infarction. Mild chronic small-vessel ischemic change of the hemispheric white matter. Electronically Signed   By: Nelson Chimes M.D.   On: 06/13/2019 21:52   US Carotid Bilateral (at [facility] Only)  Result Date: 06/13/2019 CLINICAL DATA:  78 year old female with a history of stroke EXAM: BILATERAL CAROTID DUPLEX ULTRASOUND TECHNIQUE: Pearline Cables scale imaging, color Doppler and duplex ultrasound were performed of bilateral carotid and vertebral arteries in the neck. COMPARISON:  None. FINDINGS: Criteria: Quantification of carotid stenosis is based on velocity parameters that correlate the residual internal carotid diameter with NASCET-based stenosis levels, using the diameter of the distal internal carotid lumen as the denominator for stenosis measurement. The following velocity measurements were obtained: RIGHT ICA:  Systolic 96 cm/sec, Diastolic 30 cm/sec CCA:  71 cm/sec SYSTOLIC ICA/CCA RATIO:  1.3 ECA:  75 cm/sec LEFT ICA:  Systolic 628 cm/sec, Diastolic 27 cm/sec CCA:  95 cm/sec SYSTOLIC ICA/CCA RATIO:  1.2 ECA:  89 cm/sec Right Brachial SBP: Not acquired Left Brachial SBP: Not acquired RIGHT CAROTID ARTERY: No significant calcifications of the right common carotid artery. Intermediate waveform maintained. Heterogeneous and partially calcified plaque at the right carotid bifurcation. No significant lumen shadowing. Low resistance waveform of the right ICA. No significant tortuosity. RIGHT VERTEBRAL ARTERY: Antegrade flow with low resistance waveform. LEFT CAROTID ARTERY: No significant calcifications of the left common carotid artery. Intermediate waveform maintained. Heterogeneous and partially calcified plaque at the left carotid bifurcation without significant lumen shadowing. Low resistance waveform of the left ICA. No significant tortuosity. LEFT VERTEBRAL ARTERY:  Antegrade flow with low resistance waveform.  IMPRESSION: Color duplex indicates minimal heterogeneous and calcified plaque, with no hemodynamically significant stenosis by duplex criteria in  the extracranial cerebrovascular circulation. Signed, Dulcy Fanny. Dellia Nims, RPVI [facility] Electronically Signed   By: Corrie Mckusick D.O.   On: 06/13/2019 17:00    ASSESSMENT AND PLAN:   Active Problems:   Stroke (cerebrum) ([place])  78 y.o. female with pertinent past medical history of normocytic anemia, chronic pain syndrome, hyperlipidemia, CVA s/p tPA (9/18), B12 deficiency, lumbar DDD, depression and pulmonary fibrosis presenting to the ED with chief complaints of word finding difficulty, slurred speech and being "off balance".  1. Speech disturbances - concerns for TIA/Ischemic event in a patient with hx of stroke s/p IV alteplase 9/18 - CT head reviewed and negative for acute intracranial abnormality - HgbA1c- 5.4, fasting lipid panel- LDL- 57 - MRI/MRA of the brain without contrast-confirms acute stroke - PT consult, OT consult, Speech consult - Echocardiogram-done results are pending. - Carotid dopplers- no significant blockages. -  Prophylactic therapy- Antiplatelet med: Plavix - dose 75mg /day, patient is allergic to Aspirin -  NPO until RN stroke swallow screen-tolerated diet has swallow evaluation done. - Telemetry monitoring. -  Frequent neuro checks - Neurology consult placed. Message sent via Haiku to Dr. Creig Hines   2. HLD  + Goal LDL<100 - Atorvastatin 80mg  PO qhs   3. Chronic pain syndrome - hx of lumbar DDD - Continue Cymbalta and Lyrica - Lidoderm patch  4. Pulmonary Fibrosis - currently not symptomatic - Follow with pulmonologist Dr. Ashby Dawes - Continue Ventolin  5. Depression - Continue Wellbutrin  6. DVT prophylaxis - Enoxaparin SubQ   Physical therapy suggested patient will need to go to a rehab center.  Patient is agreeable.   All the records  are reviewed and case discussed with Care Management/Social Workerr. Management plans discussed with the patient, family and they are in agreement.  CODE STATUS: Full code.  TOTAL TIME TAKING CARE OF THIS PATIENT: 35 minutes.     POSSIBLE D/C IN 1-2 DAYS, DEPENDING ON CLINICAL CONDITION.   Vaughan Basta M.D on 06/14/2019   Between 7am to 6pm - Pager - [phone]  After 6pm go to [email or web address] - password [email or web address] [facility]  Office  [phone]  CC: Primary care physician; Burnard Hawthorne, FNP  Note: This dictation was prepared with Dragon dictation along with smaller phrase technology. Any transcriptional errors that result from this process are unintentional.

## 2019-06-14 NOTE — Consult Note (Signed)
Stroke consult    Chief Complaint: Trouble finding words. No  Family at  Bedside.  HPI: Melody Ochoa is an 78 y.o. female with stroke on 9/18 with tpa administration, pulmonary fibrosis admitted for trouble finding word and confusion bd gait imbalance ( patient has h/o of gait imbalance for "long time") She has a history   On the morning of admission , patient started having difficulty finding words. Per note,her husband noticed that her speech was "off" sounding gibberish, slurred and unable to find her words.  Denies LOC, sz like activity, motor deficit, nausea/vting, visual disturbances. Patient states she had difficulty with her balance ( patient states that she has had difficully with her gait for a long time).Patient also has history of stroke s/p tpa on 9/18 ( MRI On 08/10/17:Patchy small volume acute ischemic nonhemorrhagic superior left cerebellar infarct, left SCA territory.Remote left parietal and left cerebellar infarcts, Echo on 9/6 with EF 50-55% with n o wall motion abnliites) ).  patient started on plavix but stopped taking it because concern of easy bruising.  Upon arrival to the ER,  afebrile with BO 146/69 mm Hg, HR 92 beats/min.  Initial NIH stroke scale 0.  Noncontrast CT head was obtained and showed no acute intracranial abnormality.  Labs unremarkable.  Patient admitted for further management  US carotid on 7/8: minimal heterogeneous and calcified plaque, with no hemodynamically significant stenosis by duplex criteria in the extracranial cerebrovascular circulation.  MRI 7/8: 2.5 cm region of acute infarction in the right posterior frontal cortical and subcortical brain. No mass effect or hemorrhage.Old left parieto-occipital cortical and subcortical infarction.  This Am, patient c/o of slight HA and states that her speech has gotten much better  Past Surgical History:  Procedure Laterality Date  . BACK SURGERY  13  . BREAST BIOPSY Bilateral    cores "years ago"   . BREAST EXCISIONAL BIOPSY Left 90's  . CATARACT EXTRACTION W/PHACO Left 12/20/2018   Procedure: CATARACT EXTRACTION PHACO AND INTRAOCULAR LENS PLACEMENT ([place]) LEFT TOPICAL;  Surgeon: Leandrew Koyanagi, MD;  Location: [place];  Service: Ophthalmology;  Laterality: Left;  . CATARACT EXTRACTION W/PHACO Right 01/24/2019   Procedure: CATARACT EXTRACTION PHACO AND INTRAOCULAR LENS PLACEMENT ([place]) RIGHT;  Surgeon: Leandrew Koyanagi, MD;  Location: [place];  Service: Ophthalmology;  Laterality: Right;  . CERVICAL DISC SURGERY  09  . COLONOSCOPY WITH PROPOFOL N/A 01/13/2016   Procedure: COLONOSCOPY WITH PROPOFOL;  Surgeon: [name], MD;  Location: [facility] ENDOSCOPY;  Service: Endoscopy;  Laterality: N/A;  . FACELIFT  94  . LOOP RECORDER INSERTION N/A 08/23/2017   Procedure: LOOP RECORDER INSERTION;  Surgeon: Thompson Grayer, MD;  Location: [place] [facility];  Service: Cardiovascular;  Laterality: N/A;  . TEE WITHOUT CARDIOVERSION N/A 08/12/2017   Procedure: TRANSESOPHAGEAL ECHOCARDIOGRAM (TEE);  Surgeon: Sueanne Margarita, MD;  Location: [facility] ENDOSCOPY;  Service: Cardiovascular;  Laterality: N/A;  . TONSILLECTOMY    . TOTAL HIP ARTHROPLASTY Right 04/22/2016   Procedure: TOTAL HIP ARTHROPLASTY;  Surgeon: Corky Mull, MD;  Location: [facility];  Service: Orthopedics;  Laterality: Right;    Family History  Problem Relation Age of Onset  . Lung cancer Father   . Aneurysm Brother   . Stroke Brother   . Diabetes Maternal Grandmother   . Kidney disease Maternal Grandfather   . Cushing syndrome Paternal Grandmother   . Dementia Paternal Grandfather   . Breast cancer Maternal Aunt 52   Social History:  reports that she  quit smoking about 22 years ago. Her smoking use included cigarettes. She has a 40.00 pack-year smoking history. She has never used smokeless tobacco. She reports current alcohol use. She reports that she does not use drugs.  Allergies:  Allergies  Allergen Reactions   . Aleve [Naproxen Sodium] Other (See Comments)    Gi upset  . Aspirin Other (See Comments)    Stomach pain-aggravates diverticulosis  . Celebrex [Celecoxib] Other (See Comments)    Dizziness   . Effexor [Venlafaxine] Other (See Comments)    Hot flashes   . Gabapentin Other (See Comments)    "Night terrors"  . Hydrocodone-Homatropine Diarrhea  . Ibuprofen Other (See Comments)    Gi upset   . Tape Itching and Other (See Comments)    Itchy blisters  . Vioxx [Rofecoxib] Other (See Comments)    Gi upset  . Other Rash and Other (See Comments)    bandaides  . Tramadol Other (See Comments)    hallucinations    Medications Prior to Admission  Medication Sig Dispense Refill  . atorvastatin (LIPITOR) 80 MG tablet TAKE 1 TABLET (80 MG TOTAL) BY MOUTH DAILY AT 6 PM. MUST SCHEDUALE APPT. FOR FURTHER REFILLS. 90 tablet 0  . buPROPion (WELLBUTRIN XL) 300 MG 24 hr tablet Take 300 mg by mouth daily.     . Calcium Carbonate-Vitamin D (CALTRATE 600+D PO) Take 1 tablet by mouth daily.    . clopidogrel (PLAVIX) 75 MG tablet TAKE 1 TABLET BY MOUTH EVERY DAY 30 tablet 11  . DULoxetine (CYMBALTA) 60 MG capsule Take 60 mg by mouth 2 (two) times daily.     . famotidine (PEPCID) 20 MG tablet Take 20 mg by mouth at bedtime.   12  . ferrous sulfate 325 (65 FE) MG tablet Take 1 tablet (325 mg total) by mouth daily. 30 tablet 0  . fluticasone (FLONASE) 50 MCG/ACT nasal spray Place 1 spray into both nostrils daily as needed for allergies or rhinitis (congestion).     . pregabalin (LYRICA) 50 MG capsule Take 1 capsule (50 mg total) by mouth 3 (three) times daily. 90 capsule 2  . tiotropium (SPIRIVA) 18 MCG inhalation capsule Place 1 capsule (18 mcg total) into inhaler and inhale daily for 30 days. 30 capsule 12  . triamcinolone cream (KENALOG) 0.1 % Apply 1 application topically 2 (two) times daily as needed (rash/skin irritation.).   0  . VENTOLIN HFA 108 (90 Base) MCG/ACT inhaler TAKE 2 PUFFS BY MOUTH EVERY  6 HOURS AS NEEDED FOR WHEEZE OR SHORTNESS OF BREATH 18 g 0    ROS: As per HPI  Physical Examination: Blood pressure 122/71, pulse 79, temperature 98.4 F (36.9 C), temperature source Oral, resp. rate 20, height 5\' 4"  (1.626 m), weight 91.5 kg, SpO2 97 %.  HEENT-  Normocephalic, no lesions, without obvious abnormality.  Normal external eye and conjunctiva.  Normal TM's bilaterally.  Normal auditory canals and external ears. Normal external nose, mucus membranes and septum.  Normal pharynx. Neck supple with no masses, nodes, nodules or enlargement. Cardiovascular - regular rate and rhythm, S1, S2 normal, no murmur, click, rub or gallop Lungs - chest clear, no wheezing, rales, normal symmetric air entry, Heart exam - S1, S2 normal, no murmur, no gallop, rate regular Abdomen - soft, non-tender; bowel sounds normal; no masses,  no organomegaly Extremities - no edema  Neurologic Examination: Alert, awake, some trouble findings her word burt speech nle, follows commands CN: PERLA, EOMI, VFF, no nystagmus, face symmetrical ,  face sensation intact to touch,  No motor deficit appreciated No sensory deficit appreciated No coordination deficit appreciated DTR not checked, gait not checked at time of examination.  Results for orders placed or performed during the hospital encounter of 06/13/19 (from the past 48 hour(s))  Glucose, capillary     Status: Abnormal   Collection Time: 06/13/19  9:38 AM  Result Value Ref Range   Glucose-Capillary 107 (H) 70 - 99 mg/dL   Comment 1 Notify RN    Comment 2 Document in Chart   Protime-INR     Status: None   Collection Time: 06/13/19 10:01 AM  Result Value Ref Range   Prothrombin Time 12.9 11.4 - 15.2 seconds   INR 1.0 0.8 - 1.2    Comment: (NOTE) INR goal varies based on device and disease states. Performed at [facility], [address] [place], [place] [address]   APTT     Status: None   Collection Time: 06/13/19 10:01 AM  Result  Value Ref Range   aPTT 31 24 - 36 seconds    Comment: Performed at [facility], [address] [place], [place] [address]  CBC     Status: Abnormal   Collection Time: 06/13/19 10:01 AM  Result Value Ref Range   WBC 6.5 4.0 - 10.5 K/uL   RBC 3.58 (L) 3.87 - 5.11 MIL/uL   Hemoglobin 9.9 (L) 12.0 - 15.0 g/dL   HCT 31.0 (L) 36.0 - 46.0 %   MCV 86.6 80.0 - 100.0 fL   MCH 27.7 26.0 - 34.0 pg   MCHC 31.9 30.0 - 36.0 g/dL   RDW 14.5 11.5 - 15.5 %   Platelets 209 150 - 400 K/uL   nRBC 0.0 0.0 - 0.2 %    Comment: Performed at [facility], [address] [place], [place] [address]  Differential     Status: None   Collection Time: 06/13/19 10:01 AM  Result Value Ref Range   Neutrophils Relative % 69 %   Neutro Abs 4.5 1.7 - 7.7 K/uL   Lymphocytes Relative 16 %   Lymphs Abs 1.1 0.7 - 4.0 K/uL   Monocytes Relative 9 %   Monocytes Absolute 0.6 0.1 - 1.0 K/uL   Eosinophils Relative 5 %   Eosinophils Absolute 0.3 0.0 - 0.5 K/uL   Basophils Relative 1 %   Basophils Absolute 0.1 0.0 - 0.1 K/uL   Immature Granulocytes 0 %   Abs Immature Granulocytes 0.02 0.00 - 0.07 K/uL    Comment: Performed at [facility], [address] [place], [place] [address]  Comprehensive metabolic panel     Status: Abnormal   Collection Time: 06/13/19 10:01 AM  Result Value Ref Range   Sodium 136 135 - 145 mmol/L   Potassium 4.3 3.5 - 5.1 mmol/L   Chloride 104 98 - 111 mmol/L   CO2 23 22 - 32 mmol/L   Glucose, Bld 101 (H) 70 - 99 mg/dL   BUN 16 8 - 23 mg/dL   Creatinine, Ser 1.00 0.44 - 1.00 mg/dL   Calcium 9.1 8.9 - 10.3 mg/dL   Total Protein 7.0 6.5 - 8.1 g/dL   Albumin 4.0 3.5 - 5.0 g/dL   AST 30 15 - 41 U/L   ALT 33 0 - 44 U/L   Alkaline Phosphatase 65 38 - 126 U/L   Total Bilirubin 0.3 0.3 - 1.2 mg/dL   GFR calc non Af Amer 54 (L) >60 mL/min  GFR calc Af Amer >60 >60 mL/min   Anion gap 9 5 - 15    Comment: Performed at [facility], [address]  [place], [place] [address]  Urine Drug Screen, Qualitative     Status: None   Collection Time: 06/13/19 11:07 AM  Result Value Ref Range   Tricyclic, Ur Screen NONE DETECTED NONE DETECTED   Amphetamines, Ur Screen NONE DETECTED NONE DETECTED   MDMA (Ecstasy)Ur Screen NONE DETECTED NONE DETECTED   Cocaine Metabolite,Ur [place] NONE DETECTED NONE DETECTED   Opiate, Ur Screen NONE DETECTED NONE DETECTED   Phencyclidine (PCP) Ur S NONE DETECTED NONE DETECTED   Cannabinoid 50 Ng, Ur [place] NONE DETECTED NONE DETECTED   Barbiturates, Ur Screen NONE DETECTED NONE DETECTED   Benzodiazepine, Ur Scrn NONE DETECTED NONE DETECTED   Methadone Scn, Ur NONE DETECTED NONE DETECTED    Comment: (NOTE) Tricyclics + metabolites, urine    Cutoff 1000 ng/mL Amphetamines + metabolites, urine  Cutoff 1000 ng/mL MDMA (Ecstasy), urine              Cutoff 500 ng/mL Cocaine Metabolite, urine          Cutoff 300 ng/mL Opiate + metabolites, urine        Cutoff 300 ng/mL Phencyclidine (PCP), urine         Cutoff 25 ng/mL Cannabinoid, urine                 Cutoff 50 ng/mL Barbiturates + metabolites, urine  Cutoff 200 ng/mL Benzodiazepine, urine              Cutoff 200 ng/mL Methadone, urine                   Cutoff 300 ng/mL The urine drug screen provides only a preliminary, unconfirmed analytical test result and should not be used for non-medical purposes. Clinical consideration and professional judgment should be applied to any positive drug screen result due to possible interfering substances. A more specific alternate chemical method must be used in order to obtain a confirmed analytical result. Gas chromatography / mass spectrometry (GC/MS) is the preferred confirmat ory method. Performed at [facility], [address] [place], [place] [address]   Urinalysis, Complete w Microscopic     Status: Abnormal   Collection Time: 06/13/19 11:07 AM  Result Value Ref Range   Color, Urine YELLOW (A) YELLOW    APPearance CLEAR (A) CLEAR   Specific Gravity, Urine 1.013 1.005 - 1.030   pH 7.0 5.0 - 8.0   Glucose, UA NEGATIVE NEGATIVE mg/dL   Hgb urine dipstick NEGATIVE NEGATIVE   Bilirubin Urine NEGATIVE NEGATIVE   Ketones, ur NEGATIVE NEGATIVE mg/dL   Protein, ur NEGATIVE NEGATIVE mg/dL   Nitrite NEGATIVE NEGATIVE   Leukocytes,Ua NEGATIVE NEGATIVE   RBC / HPF 0-5 0 - 5 RBC/hpf   WBC, UA 0-5 0 - 5 WBC/hpf   Bacteria, UA NONE SEEN NONE SEEN   Squamous Epithelial / LPF 0-5 0 - 5   Mucus PRESENT    Hyaline Casts, UA PRESENT     Comment: Performed at [facility], [address], [place],  [address]  SARS Coronavirus 2 (CEPHEID- Performed in [place] hospital lab), Hosp Order     Status: None   Collection Time: 06/13/19 11:07 AM   Specimen: Nasopharyngeal Swab  Result Value Ref Range   SARS Coronavirus 2 NEGATIVE NEGATIVE    Comment: (NOTE) If result is NEGATIVE SARS-CoV-2 target  nucleic acids are NOT DETECTED. The SARS-CoV-2 RNA is generally detectable in upper and lower  respiratory specimens during the acute phase of infection. The lowest  concentration of SARS-CoV-2 viral copies this assay can detect is 250  copies / mL. A negative result does not preclude SARS-CoV-2 infection  and should not be used as the sole basis for treatment or other  patient management decisions.  A negative result may occur with  improper specimen collection / handling, submission of specimen other  than nasopharyngeal swab, presence of viral mutation(s) within the  areas targeted by this assay, and inadequate number of viral copies  (<250 copies / mL). A negative result must be combined with clinical  observations, patient history, and epidemiological information. If result is POSITIVE SARS-CoV-2 target nucleic acids are DETECTED. The SARS-CoV-2 RNA is generally detectable in upper and lower  respiratory specimens dur ing the acute phase of infection.  Positive  results are indicative of  active infection with SARS-CoV-2.  Clinical  correlation with patient history and other diagnostic information is  necessary to determine patient infection status.  Positive results do  not rule out bacterial infection or co-infection with other viruses. If result is PRESUMPTIVE POSTIVE SARS-CoV-2 nucleic acids MAY BE PRESENT.   A presumptive positive result was obtained on the submitted specimen  and confirmed on repeat testing.  While 2019 novel coronavirus  (SARS-CoV-2) nucleic acids may be present in the submitted sample  additional confirmatory testing may be necessary for epidemiological  and / or clinical management purposes  to differentiate between  SARS-CoV-2 and other Sarbecovirus currently known to infect humans.  If clinically indicated additional testing with an alternate test  methodology [phone]) is advised. The SARS-CoV-2 RNA is generally  detectable in upper and lower respiratory sp ecimens during the acute  phase of infection. The expected result is Negative. Fact Sheet for Patients:  StrictlyIdeas.no Fact Sheet for Healthcare Providers: BankingDealers.co.za This test is not yet approved or cleared by the [place] FDA and has been authorized for detection and/or diagnosis of SARS-CoV-2 by FDA under an Emergency Use Authorization (EUA).  This EUA will remain in effect (meaning this test can be used) for the duration of the COVID-19 declaration under Section 564(b)(1) of the Act, 21 U.S.C. section 360bbb-3(b)(1), unless the authorization is terminated or revoked sooner. Performed at [facility], [address] [place], [place] [address]   Hemoglobin A1c     Status: None   Collection Time: 06/13/19 12:58 PM  Result Value Ref Range   Hgb A1c MFr Bld 5.4 4.8 - 5.6 %    Comment: (NOTE)         Prediabetes: 5.7 - 6.4         Diabetes: >6.4         Glycemic control for adults with diabetes: <7.0    Mean  Plasma Glucose 108 mg/dL    Comment: (NOTE) Performed At: [facility] [place], [place] [id number] Rush Farmer MD [id number]   Lipid panel     Status: None   Collection Time: 06/13/19 12:58 PM  Result Value Ref Range   Cholesterol 128 0 - 200 mg/dL   Triglycerides 40 <150 mg/dL   HDL 63 >40 mg/dL   Total CHOL/HDL Ratio 2.0 RATIO   VLDL 8 0 - 40 mg/dL   LDL Cholesterol 57 0 - 99 mg/dL    Comment:        Total Cholesterol/HDL:CHD Risk Coronary Heart Disease Risk  Table                     Men   Women  1/2 Average Risk   3.4   3.3  Average Risk       5.0   4.4  2 X Average Risk   9.6   7.1  3 X Average Risk  23.4   11.0        Use the calculated Patient Ratio above and the CHD Risk Table to determine the patient's CHD Risk.        ATP III CLASSIFICATION (LDL):  <100     mg/dL   Optimal  100-129  mg/dL   Near or Above                    Optimal  130-159  mg/dL   Borderline  160-189  mg/dL   High  >190     mg/dL   Very High Performed at [facility], [address] [place], [place] [address]   Glucose, capillary     Status: None   Collection Time: 06/14/19  8:14 AM  Result Value Ref Range   Glucose-Capillary 84 70 - 99 mg/dL   Ct Head Wo Contrast  Result Date: 06/13/2019 CLINICAL DATA:  Mental status changes since last evening. Dizziness. EXAM: CT HEAD WITHOUT CONTRAST TECHNIQUE: Contiguous axial images were obtained from the base of the skull through the vertex without intravenous contrast. COMPARISON:  04/11/2018 FINDINGS: Brain: Stable age related cerebral atrophy, ventriculomegaly and periventricular white matter disease. No extra-axial fluid collections are identified. No CT findings for acute hemispheric infarction or intracranial hemorrhage. No mass lesions. Remote left occipital infarct. The brainstem and cerebellum are normal. Vascular: Stable vascular calcifications. No definite aneurysm or hyperdense vessels. Skull: No skull  fracture or bone lesions. Sinuses/Orbits: The paranasal sinuses and mastoid air cells are clear. The globes are intact. Other: No scalp lesions or hematoma. IMPRESSION: 1. Stable age related cerebral atrophy, ventriculomegaly and periventricular white matter disease. 2. Remote occipital infarct on the left. 3. No acute intracranial findings or mass lesion. Electronically Signed   By: Marijo Sanes M.D.   On: 06/13/2019 10:44   Mr Brain Wo Contrast  Result Date: 06/13/2019 CLINICAL DATA:  Acute presentation with speech disturbance. History of recent stroke. EXAM: MRI HEAD WITHOUT CONTRAST TECHNIQUE: Multiplanar, multiecho pulse sequences of the brain and surrounding structures were obtained without intravenous contrast. COMPARISON:  Head CT 06/13/2019 FINDINGS: Brain: Diffusion imaging shows a 2.5 Cm region of acute infarction in the right posterior frontal region, cortical and subcortical. No mass effect or hemorrhage. Brainstem is normal. There is an old small vessel infarction in the inferior left cerebellum. There are a few old small vessel infarctions in the superior cerebellum. Cerebral hemispheres show an old infarction at the left parieto-occipital junction affecting the cortical and subcortical brain and mild chronic small-vessel ischemic change the white matter on both sides. No mass lesion, hemorrhage, hydrocephalus or extra-axial collection. Vascular: Major vessels at the base of the brain show flow. Skull and upper cervical spine: Negative Sinuses/Orbits: Clear/normal Other: None IMPRESSION: 2.5 cm region of acute infarction in the right posterior frontal cortical and subcortical brain. No mass effect or hemorrhage. Old left parieto-occipital cortical and subcortical infarction. Mild chronic small-vessel ischemic change of the hemispheric white matter. Electronically Signed   By: Nelson Chimes M.D.   On: 06/13/2019 21:52   US Carotid Bilateral (at [facility] Only)  Result Date: 06/13/2019 CLINICAL  DATA:  78 year old female with a history of stroke EXAM: BILATERAL CAROTID DUPLEX ULTRASOUND TECHNIQUE: Pearline Cables scale imaging, color Doppler and duplex ultrasound were performed of bilateral carotid and vertebral arteries in the neck. COMPARISON:  None. FINDINGS: Criteria: Quantification of carotid stenosis is based on velocity parameters that correlate the residual internal carotid diameter with NASCET-based stenosis levels, using the diameter of the distal internal carotid lumen as the denominator for stenosis measurement. The following velocity measurements were obtained: RIGHT ICA:  Systolic 96 cm/sec, Diastolic 30 cm/sec CCA:  71 cm/sec SYSTOLIC ICA/CCA RATIO:  1.3 ECA:  75 cm/sec LEFT ICA:  Systolic 297 cm/sec, Diastolic 27 cm/sec CCA:  95 cm/sec SYSTOLIC ICA/CCA RATIO:  1.2 ECA:  89 cm/sec Right Brachial SBP: Not acquired Left Brachial SBP: Not acquired RIGHT CAROTID ARTERY: No significant calcifications of the right common carotid artery. Intermediate waveform maintained. Heterogeneous and partially calcified plaque at the right carotid bifurcation. No significant lumen shadowing. Low resistance waveform of the right ICA. No significant tortuosity. RIGHT VERTEBRAL ARTERY: Antegrade flow with low resistance waveform. LEFT CAROTID ARTERY: No significant calcifications of the left common carotid artery. Intermediate waveform maintained. Heterogeneous and partially calcified plaque at the left carotid bifurcation without significant lumen shadowing. Low resistance waveform of the left ICA. No significant tortuosity. LEFT VERTEBRAL ARTERY:  Antegrade flow with low resistance waveform. IMPRESSION: Color duplex indicates minimal heterogeneous and calcified plaque, with no hemodynamically significant stenosis by duplex criteria in the extracranial cerebrovascular circulation. Signed, Dulcy Fanny. Dellia Nims, RPVI [facility] Radiology Electronically Signed   By: Corrie Mckusick D.O.   On: 06/13/2019 17:00    Assessment:   78 y/o with h/o stroke in 9/18 sp tpa admitted with slurred speech/trble finding words/gait imbalance in whom MRI showed 2.5 cm region of acute infarction in the right posterior frontalcortical and subcortical brain. No mass effect or hemorrhage.Old left parieto-occipital cortical and subcortical infarction.     Plan:  1. HgbA1c, fasting lipid panel 2. PT consult, OT consult, Speech consult 3. Echocardiogram 3. Carotid dopplers 4. Prophylactic therapy-Plavix if tolerated. Patient states that ASA gave her some gastric discomfort  5. Risk factor modification 6. Telemetry monitoring 7. Frequent neuro checks with neuro protective measures including normothermia, normoglycemia, correct electrolytes/metabolic Mertha Baars, MD/Neurology 06/14/2019, 9:13 AM

## 2019-06-15 LAB — ECHOCARDIOGRAM COMPLETE
Height: 64 in
Weight: 3227.53 oz

## 2019-06-15 NOTE — Progress Notes (Signed)
Patient discharged home with home health PT. All discharge instructions given and all questions answered.

## 2019-06-15 NOTE — Consult Note (Signed)
Stroke consult    Chief Complaint: Trouble finding words. No  Family at  Bedside.  HPI: Melody Ochoa is an 78 y.o. female with stroke on 9/18 with tpa administration, pulmonary fibrosis admitted for trouble finding word and confusion bd gait imbalance ( patient has h/o of gait imbalance for "long time") She has a history   On the morning of admission , patient started having difficulty finding words. Per note,her husband noticed that her speech was "off" sounding gibberish, slurred and unable to find her words.  Denies LOC, sz like activity, motor deficit, nausea/vting, visual disturbances. Patient states she had difficulty with her balance ( patient states that she has had difficully with her gait for a long time).Patient also has history of stroke s/p tpa on 9/18 ( MRI On 08/10/17:Patchy small volume acute ischemic nonhemorrhagic superior left cerebellar infarct, left SCA territory.Remote left parietal and left cerebellar infarcts, Echo on 9/6 with EF 50-55% with n o wall motion abnliites) ).  patient started on plavix but stopped taking it because concern of easy bruising.  Upon arrival to the ER,  afebrile with BO 146/69 mm Hg, HR 92 beats/min.  Initial NIH stroke scale 0.  Noncontrast CT head was obtained and showed no acute intracranial abnormality.  Labs unremarkable.  Patient admitted for further management  US carotid on 7/8: minimal heterogeneous and calcified plaque, with no hemodynamically significant stenosis by duplex criteria in the extracranial cerebrovascular circulation.  MRI 7/8: 2.5 cm region of acute infarction in the right posterior frontal cortical and subcortical brain. No mass effect or hemorrhage.Old left parieto-occipital cortical and subcortical infarction.  07/09: This Am, patient c/o of slight HA and states that her speech has gotten much better  07/10: This am pateitn states she feels much better, not as confused as yesterday, resting comfortably on chair  watching TV.  Past Surgical History:  Procedure Laterality Date  . BACK SURGERY  13  . BREAST BIOPSY Bilateral    cores "years ago"  . BREAST EXCISIONAL BIOPSY Left 90's  . CATARACT EXTRACTION W/PHACO Left 12/20/2018   Procedure: CATARACT EXTRACTION PHACO AND INTRAOCULAR LENS PLACEMENT ([place]) LEFT TOPICAL;  Surgeon: Leandrew Koyanagi, MD;  Location: [facility];  Service: Ophthalmology;  Laterality: Left;  . CATARACT EXTRACTION W/PHACO Right 01/24/2019   Procedure: CATARACT EXTRACTION PHACO AND INTRAOCULAR LENS PLACEMENT ([place]) RIGHT;  Surgeon: Leandrew Koyanagi, MD;  Location: [employer] [facility];  Service: Ophthalmology;  Laterality: Right;  . CERVICAL DISC SURGERY  09  . COLONOSCOPY WITH PROPOFOL N/A 01/13/2016   Procedure: COLONOSCOPY WITH PROPOFOL;  Surgeon: [name], MD;  Location: [facility];  Service: Endoscopy;  Laterality: N/A;  . FACELIFT  94  . LOOP RECORDER INSERTION N/A 08/23/2017   Procedure: LOOP RECORDER INSERTION;  Surgeon: Thompson Grayer, MD;  Location: [facility];  Service: Cardiovascular;  Laterality: N/A;  . TEE WITHOUT CARDIOVERSION N/A 08/12/2017   Procedure: TRANSESOPHAGEAL ECHOCARDIOGRAM (TEE);  Surgeon: Sueanne Margarita, MD;  Location: [facility];  Service: Cardiovascular;  Laterality: N/A;  . TONSILLECTOMY    . TOTAL HIP ARTHROPLASTY Right 04/22/2016   Procedure: TOTAL HIP ARTHROPLASTY;  Surgeon: Corky Mull, MD;  Location: [facility];  Service: Orthopedics;  Laterality: Right;    Family History  Problem Relation Age of Onset  . Lung cancer Father   . Aneurysm Brother   . Stroke Brother   . Diabetes Maternal Grandmother   . Kidney disease Maternal Grandfather   . Cushing syndrome Paternal Grandmother   .  Dementia Paternal Grandfather   . Breast cancer Maternal Aunt 52   Social History:  reports that she quit smoking about 22 years ago. Her smoking use included cigarettes. She has a 40.00 pack-year smoking history. She has never used  smokeless tobacco. She reports current alcohol use. She reports that she does not use drugs.  Allergies:  Allergies  Allergen Reactions  . Aleve [Naproxen Sodium] Other (See Comments)    Gi upset  . Aspirin Other (See Comments)    Stomach pain-aggravates diverticulosis  . Celebrex [Celecoxib] Other (See Comments)    Dizziness   . Effexor [Venlafaxine] Other (See Comments)    Hot flashes   . Gabapentin Other (See Comments)    "Night terrors"  . Hydrocodone-Homatropine Diarrhea  . Ibuprofen Other (See Comments)    Gi upset   . Tape Itching and Other (See Comments)    Itchy blisters  . Vioxx [Rofecoxib] Other (See Comments)    Gi upset  . Other Rash and Other (See Comments)    bandaides  . Tramadol Other (See Comments)    hallucinations    Medications Prior to Admission  Medication Sig Dispense Refill  . atorvastatin (LIPITOR) 80 MG tablet TAKE 1 TABLET (80 MG TOTAL) BY MOUTH DAILY AT 6 PM. MUST SCHEDUALE APPT. FOR FURTHER REFILLS. 90 tablet 0  . buPROPion (WELLBUTRIN XL) 300 MG 24 hr tablet Take 300 mg by mouth daily.     . Calcium Carbonate-Vitamin D (CALTRATE 600+D PO) Take 1 tablet by mouth daily.    . clopidogrel (PLAVIX) 75 MG tablet TAKE 1 TABLET BY MOUTH EVERY DAY 30 tablet 11  . DULoxetine (CYMBALTA) 60 MG capsule Take 60 mg by mouth 2 (two) times daily.     . famotidine (PEPCID) 20 MG tablet Take 20 mg by mouth at bedtime.   12  . ferrous sulfate 325 (65 FE) MG tablet Take 1 tablet (325 mg total) by mouth daily. 30 tablet 0  . fluticasone (FLONASE) 50 MCG/ACT nasal spray Place 1 spray into both nostrils daily as needed for allergies or rhinitis (congestion).     . pregabalin (LYRICA) 50 MG capsule Take 1 capsule (50 mg total) by mouth 3 (three) times daily. 90 capsule 2  . tiotropium (SPIRIVA) 18 MCG inhalation capsule Place 1 capsule (18 mcg total) into inhaler and inhale daily for 30 days. 30 capsule 12  . triamcinolone cream (KENALOG) 0.1 % Apply 1 application  topically 2 (two) times daily as needed (rash/skin irritation.).   0  . VENTOLIN HFA 108 (90 Base) MCG/ACT inhaler TAKE 2 PUFFS BY MOUTH EVERY 6 HOURS AS NEEDED FOR WHEEZE OR SHORTNESS OF BREATH 18 g 0    ROS: As per HPI  Physical Examination: Blood pressure (!) 146/81, pulse 75, temperature 97.9 F (36.6 C), temperature source Oral, resp. rate 18, height 5\' 4"  (1.626 m), weight 91.5 kg, SpO2 97 %.  07/10: Patient is alert, awake, oriented x3, speech is getting better. None focal exam   HEENT-  Normocephalic, no lesions, without obvious abnormality.  Normal external eye and conjunctiva.  Normal TM's bilaterally.  Normal auditory canals and external ears. Normal external nose, mucus membranes and septum.  Normal pharynx. Neck supple with no masses, nodes, nodules or enlargement. Cardiovascular - regular rate and rhythm, S1, S2 normal, no murmur, click, rub or gallop Lungs - chest clear, no wheezing, rales, normal symmetric air entry, Heart exam - S1, S2 normal, no murmur, no gallop, rate regular Abdomen - soft,  non-tender; bowel sounds normal; no masses,  no organomegaly Extremities - no edema   Neurologic Examination: Alert, awake, some trouble findings her word burt speech nle, follows commands CN: PERLA, EOMI, VFF, no nystagmus, face symmetrical , face sensation intact to touch,  No motor deficit appreciated No sensory deficit appreciated No coordination deficit appreciated DTR not checked, gait not checked at time of examination.  Results for orders placed or performed during the hospital encounter of 06/13/19 (from the past 48 hour(s))  Protime-INR     Status: None   Collection Time: 06/13/19 10:01 AM  Result Value Ref Range   Prothrombin Time 12.9 11.4 - 15.2 seconds   INR 1.0 0.8 - 1.2    Comment: (NOTE) INR goal varies based on device and disease states. Performed at [facility], [address] [place], [place] [address]   APTT     Status: None    Collection Time: 06/13/19 10:01 AM  Result Value Ref Range   aPTT 31 24 - 36 seconds    Comment: Performed at [facility], [address] [place], [place] [address]  CBC     Status: Abnormal   Collection Time: 06/13/19 10:01 AM  Result Value Ref Range   WBC 6.5 4.0 - 10.5 K/uL   RBC 3.58 (L) 3.87 - 5.11 MIL/uL   Hemoglobin 9.9 (L) 12.0 - 15.0 g/dL   HCT 31.0 (L) 36.0 - 46.0 %   MCV 86.6 80.0 - 100.0 fL   MCH 27.7 26.0 - 34.0 pg   MCHC 31.9 30.0 - 36.0 g/dL   RDW 14.5 11.5 - 15.5 %   Platelets 209 150 - 400 K/uL   nRBC 0.0 0.0 - 0.2 %    Comment: Performed at [facility], [address] [place], [place] [address]  Differential     Status: None   Collection Time: 06/13/19 10:01 AM  Result Value Ref Range   Neutrophils Relative % 69 %   Neutro Abs 4.5 1.7 - 7.7 K/uL   Lymphocytes Relative 16 %   Lymphs Abs 1.1 0.7 - 4.0 K/uL   Monocytes Relative 9 %   Monocytes Absolute 0.6 0.1 - 1.0 K/uL   Eosinophils Relative 5 %   Eosinophils Absolute 0.3 0.0 - 0.5 K/uL   Basophils Relative 1 %   Basophils Absolute 0.1 0.0 - 0.1 K/uL   Immature Granulocytes 0 %   Abs Immature Granulocytes 0.02 0.00 - 0.07 K/uL    Comment: Performed at [facility], [address] [place], [place] [address]  Comprehensive metabolic panel     Status: Abnormal   Collection Time: 06/13/19 10:01 AM  Result Value Ref Range   Sodium 136 135 - 145 mmol/L   Potassium 4.3 3.5 - 5.1 mmol/L   Chloride 104 98 - 111 mmol/L   CO2 23 22 - 32 mmol/L   Glucose, Bld 101 (H) 70 - 99 mg/dL   BUN 16 8 - 23 mg/dL   Creatinine, Ser 1.00 0.44 - 1.00 mg/dL   Calcium 9.1 8.9 - 10.3 mg/dL   Total Protein 7.0 6.5 - 8.1 g/dL   Albumin 4.0 3.5 - 5.0 g/dL   AST 30 15 - 41 U/L   ALT 33 0 - 44 U/L   Alkaline Phosphatase 65 38 - 126 U/L   Total Bilirubin 0.3 0.3 - 1.2 mg/dL   GFR calc non Af Amer 54 (L) >60 mL/min   GFR calc Af Amer >60 >60 mL/min  Anion gap 9 5 - 15    Comment: Performed at  [facility], [address] [place], [place] [address]  Urine Drug Screen, Qualitative     Status: None   Collection Time: 06/13/19 11:07 AM  Result Value Ref Range   Tricyclic, Ur Screen NONE DETECTED NONE DETECTED   Amphetamines, Ur Screen NONE DETECTED NONE DETECTED   MDMA (Ecstasy)Ur Screen NONE DETECTED NONE DETECTED   Cocaine Metabolite,Ur [place] NONE DETECTED NONE DETECTED   Opiate, Ur Screen NONE DETECTED NONE DETECTED   Phencyclidine (PCP) Ur S NONE DETECTED NONE DETECTED   Cannabinoid 50 Ng, Ur  NONE DETECTED NONE DETECTED   Barbiturates, Ur Screen NONE DETECTED NONE DETECTED   Benzodiazepine, Ur Scrn NONE DETECTED NONE DETECTED   Methadone Scn, Ur NONE DETECTED NONE DETECTED    Comment: (NOTE) Tricyclics + metabolites, urine    Cutoff 1000 ng/mL Amphetamines + metabolites, urine  Cutoff 1000 ng/mL MDMA (Ecstasy), urine              Cutoff 500 ng/mL Cocaine Metabolite, urine          Cutoff 300 ng/mL Opiate + metabolites, urine        Cutoff 300 ng/mL Phencyclidine (PCP), urine         Cutoff 25 ng/mL Cannabinoid, urine                 Cutoff 50 ng/mL Barbiturates + metabolites, urine  Cutoff 200 ng/mL Benzodiazepine, urine              Cutoff 200 ng/mL Methadone, urine                   Cutoff 300 ng/mL The urine drug screen provides only a preliminary, unconfirmed analytical test result and should not be used for non-medical purposes. Clinical consideration and professional judgment should be applied to any positive drug screen result due to possible interfering substances. A more specific alternate chemical method must be used in order to obtain a confirmed analytical result. Gas chromatography / mass spectrometry (GC/MS) is the preferred confirmat ory method. Performed at [facility], [address] [place], [place] [address]   Urinalysis, Complete w Microscopic     Status: Abnormal   Collection Time: 06/13/19 11:07 AM  Result Value Ref Range    Color, Urine YELLOW (A) YELLOW   APPearance CLEAR (A) CLEAR   Specific Gravity, Urine 1.013 1.005 - 1.030   pH 7.0 5.0 - 8.0   Glucose, UA NEGATIVE NEGATIVE mg/dL   Hgb urine dipstick NEGATIVE NEGATIVE   Bilirubin Urine NEGATIVE NEGATIVE   Ketones, ur NEGATIVE NEGATIVE mg/dL   Protein, ur NEGATIVE NEGATIVE mg/dL   Nitrite NEGATIVE NEGATIVE   Leukocytes,Ua NEGATIVE NEGATIVE   RBC / HPF 0-5 0 - 5 RBC/hpf   WBC, UA 0-5 0 - 5 WBC/hpf   Bacteria, UA NONE SEEN NONE SEEN   Squamous Epithelial / LPF 0-5 0 - 5   Mucus PRESENT    Hyaline Casts, UA PRESENT     Comment: Performed at [facility], [address] [place], [place] [address]  SARS Coronavirus 2 (CEPHEID- Performed in [facility] hospital lab), Hosp Order     Status: None   Collection Time: 06/13/19 11:07 AM   Specimen: Nasopharyngeal Swab  Result Value Ref Range   SARS Coronavirus 2 NEGATIVE NEGATIVE    Comment: (NOTE) If result is NEGATIVE SARS-CoV-2 target nucleic acids are NOT DETECTED. The SARS-CoV-2 RNA is  generally detectable in upper and lower  respiratory specimens during the acute phase of infection. The lowest  concentration of SARS-CoV-2 viral copies this assay can detect is 250  copies / mL. A negative result does not preclude SARS-CoV-2 infection  and should not be used as the sole basis for treatment or other  patient management decisions.  A negative result may occur with  improper specimen collection / handling, submission of specimen other  than nasopharyngeal swab, presence of viral mutation(s) within the  areas targeted by this assay, and inadequate number of viral copies  (<250 copies / mL). A negative result must be combined with clinical  observations, patient history, and epidemiological information. If result is POSITIVE SARS-CoV-2 target nucleic acids are DETECTED. The SARS-CoV-2 RNA is generally detectable in upper and lower  respiratory specimens dur ing the acute phase of infection.   Positive  results are indicative of active infection with SARS-CoV-2.  Clinical  correlation with patient history and other diagnostic information is  necessary to determine patient infection status.  Positive results do  not rule out bacterial infection or co-infection with other viruses. If result is PRESUMPTIVE POSTIVE SARS-CoV-2 nucleic acids MAY BE PRESENT.   A presumptive positive result was obtained on the submitted specimen  and confirmed on repeat testing.  While 2019 novel coronavirus  (SARS-CoV-2) nucleic acids may be present in the submitted sample  additional confirmatory testing may be necessary for epidemiological  and / or clinical management purposes  to differentiate between  SARS-CoV-2 and other Sarbecovirus currently known to infect humans.  If clinically indicated additional testing with an alternate test  methodology [phone]) is advised. The SARS-CoV-2 RNA is generally  detectable in upper and lower respiratory sp ecimens during the acute  phase of infection. The expected result is Negative. Fact Sheet for Patients:  StrictlyIdeas.no Fact Sheet for Healthcare Providers: BankingDealers.co.za This test is not yet approved or cleared by the [place] FDA and has been authorized for detection and/or diagnosis of SARS-CoV-2 by FDA under an Emergency Use Authorization (EUA).  This EUA will remain in effect (meaning this test can be used) for the duration of the COVID-19 declaration under Section 564(b)(1) of the Act, 21 U.S.C. section 360bbb-3(b)(1), unless the authorization is terminated or revoked sooner. Performed at [facility], [address] [place], [place] [address]   Hemoglobin A1c     Status: None   Collection Time: 06/13/19 12:58 PM  Result Value Ref Range   Hgb A1c MFr Bld 5.4 4.8 - 5.6 %    Comment: (NOTE)         Prediabetes: 5.7 - 6.4         Diabetes: >6.4         Glycemic control for  adults with diabetes: <7.0    Mean Plasma Glucose 108 mg/dL    Comment: (NOTE) Performed At: [facility] [place], [place] [id number] Rush Farmer MD JQ:[id number]   Lipid panel     Status: None   Collection Time: 06/13/19 12:58 PM  Result Value Ref Range   Cholesterol 128 0 - 200 mg/dL   Triglycerides 40 <150 mg/dL   HDL 63 >40 mg/dL   Total CHOL/HDL Ratio 2.0 RATIO   VLDL 8 0 - 40 mg/dL   LDL Cholesterol 57 0 - 99 mg/dL    Comment:        Total Cholesterol/HDL:CHD Risk Coronary Heart Disease Risk Table  Men   Women  1/2 Average Risk   3.4   3.3  Average Risk       5.0   4.4  2 X Average Risk   9.6   7.1  3 X Average Risk  23.4   11.0        Use the calculated Patient Ratio above and the CHD Risk Table to determine the patient's CHD Risk.        ATP III CLASSIFICATION (LDL):  <100     mg/dL   Optimal  100-129  mg/dL   Near or Above                    Optimal  130-159  mg/dL   Borderline  160-189  mg/dL   High  >190     mg/dL   Very High Performed at [facility], [address] [place], [place] [address]   Glucose, capillary     Status: None   Collection Time: 06/14/19  8:14 AM  Result Value Ref Range   Glucose-Capillary 84 70 - 99 mg/dL  Glucose, capillary     Status: Abnormal   Collection Time: 06/14/19 12:07 PM  Result Value Ref Range   Glucose-Capillary 59 (L) 70 - 99 mg/dL   Comment 1 Notify RN   Glucose, capillary     Status: None   Collection Time: 06/14/19 12:34 PM  Result Value Ref Range   Glucose-Capillary 82 70 - 99 mg/dL  Glucose, capillary     Status: None   Collection Time: 06/14/19  5:06 PM  Result Value Ref Range   Glucose-Capillary 78 70 - 99 mg/dL   Ct Head Wo Contrast  Result Date: 06/13/2019 CLINICAL DATA:  Mental status changes since last evening. Dizziness. EXAM: CT HEAD WITHOUT CONTRAST TECHNIQUE: Contiguous axial images were obtained from the base of the skull through the vertex  without intravenous contrast. COMPARISON:  04/11/2018 FINDINGS: Brain: Stable age related cerebral atrophy, ventriculomegaly and periventricular white matter disease. No extra-axial fluid collections are identified. No CT findings for acute hemispheric infarction or intracranial hemorrhage. No mass lesions. Remote left occipital infarct. The brainstem and cerebellum are normal. Vascular: Stable vascular calcifications. No definite aneurysm or hyperdense vessels. Skull: No skull fracture or bone lesions. Sinuses/Orbits: The paranasal sinuses and mastoid air cells are clear. The globes are intact. Other: No scalp lesions or hematoma. IMPRESSION: 1. Stable age related cerebral atrophy, ventriculomegaly and periventricular white matter disease. 2. Remote occipital infarct on the left. 3. No acute intracranial findings or mass lesion. Electronically Signed   By: Marijo Sanes M.D.   On: 06/13/2019 10:44   Mr Brain Wo Contrast  Result Date: 06/13/2019 CLINICAL DATA:  Acute presentation with speech disturbance. History of recent stroke. EXAM: MRI HEAD WITHOUT CONTRAST TECHNIQUE: Multiplanar, multiecho pulse sequences of the brain and surrounding structures were obtained without intravenous contrast. COMPARISON:  Head CT 06/13/2019 FINDINGS: Brain: Diffusion imaging shows a 2.5 Cm region of acute infarction in the right posterior frontal region, cortical and subcortical. No mass effect or hemorrhage. Brainstem is normal. There is an old small vessel infarction in the inferior left cerebellum. There are a few old small vessel infarctions in the superior cerebellum. Cerebral hemispheres show an old infarction at the left parieto-occipital junction affecting the cortical and subcortical brain and mild chronic small-vessel ischemic change the white matter on both sides. No mass lesion, hemorrhage, hydrocephalus or extra-axial collection. Vascular: Major vessels at the base of the  brain show flow. Skull and upper cervical  spine: Negative Sinuses/Orbits: Clear/normal Other: None IMPRESSION: 2.5 cm region of acute infarction in the right posterior frontal cortical and subcortical brain. No mass effect or hemorrhage. Old left parieto-occipital cortical and subcortical infarction. Mild chronic small-vessel ischemic change of the hemispheric white matter. Electronically Signed   By: Nelson Chimes M.D.   On: 06/13/2019 21:52   US Carotid Bilateral (at [facility] And [facility] Only)  Result Date: 06/13/2019 CLINICAL DATA:  78 year old female with a history of stroke EXAM: BILATERAL CAROTID DUPLEX ULTRASOUND TECHNIQUE: Pearline Cables scale imaging, color Doppler and duplex ultrasound were performed of bilateral carotid and vertebral arteries in the neck. COMPARISON:  None. FINDINGS: Criteria: Quantification of carotid stenosis is based on velocity parameters that correlate the residual internal carotid diameter with NASCET-based stenosis levels, using the diameter of the distal internal carotid lumen as the denominator for stenosis measurement. The following velocity measurements were obtained: RIGHT ICA:  Systolic 96 cm/sec, Diastolic 30 cm/sec CCA:  71 cm/sec SYSTOLIC ICA/CCA RATIO:  1.3 ECA:  75 cm/sec LEFT ICA:  Systolic 353 cm/sec, Diastolic 27 cm/sec CCA:  95 cm/sec SYSTOLIC ICA/CCA RATIO:  1.2 ECA:  89 cm/sec Right Brachial SBP: Not acquired Left Brachial SBP: Not acquired RIGHT CAROTID ARTERY: No significant calcifications of the right common carotid artery. Intermediate waveform maintained. Heterogeneous and partially calcified plaque at the right carotid bifurcation. No significant lumen shadowing. Low resistance waveform of the right ICA. No significant tortuosity. RIGHT VERTEBRAL ARTERY: Antegrade flow with low resistance waveform. LEFT CAROTID ARTERY: No significant calcifications of the left common carotid artery. Intermediate waveform maintained. Heterogeneous and partially calcified plaque at the left carotid bifurcation without significant lumen  shadowing. Low resistance waveform of the left ICA. No significant tortuosity. LEFT VERTEBRAL ARTERY:  Antegrade flow with low resistance waveform. IMPRESSION: Color duplex indicates minimal heterogeneous and calcified plaque, with no hemodynamically significant stenosis by duplex criteria in the extracranial cerebrovascular circulation. Signed, Dulcy Fanny. Dellia Nims, RPVI [facility] Electronically Signed   By: Corrie Mckusick D.O.   On: 06/13/2019 17:00    Assessment:   78 y/o with h/o stroke in 9/18 sp tpa admitted with slurred speech/trble finding words/gait imbalance in whom MRI showed 2.5 cm region of acute infarction in the right posterior frontalcortical and subcortical brain. No mass effect or hemorrhage.Old left parieto-occipital cortical and subcortical infarction.     Plan:  1. HgbA1c, fasting lipid panel 2. PT consult, OT consult, Speech consult 3. Echocardiogram f/up 4. Prophylactic therapy-Plavix if tolerated. Patient states that ASA gave her some gastric discomfort  5. Risk factor modification 6. Telemetry monitoring 7. Frequent neuro checks with neuro protective measures including normothermia, normoglycemia, correct electrolytes/metabolic abnliites 8- Patient neurologicaly stable for the time being   Kayley Zeiders, MD/Neurology 06/15/2019, 10:00 AM

## 2019-06-15 NOTE — Progress Notes (Signed)
Physical Therapy Treatment Patient Details Name: Melody Ochoa MRN: [id number] DOB: Sep 04, 1941 Today's Date: 06/15/2019    History of Present Illness From MD H&P: Pt is a 78 y.o. female with pertinent past medical history of normocytic anemia, chronic pain syndrome, hyperlipidemia, CVA s/p tPA (9/18), B12 deficiency, lumbar DDD, depression and pulmonary fibrosis presenting to the ED with chief complaints of word finding difficulty, slurred speech and being "off balance".  Noncontrast CT head was obtained and showed no acute intracranial abnormality.  Labs unremarkable.  Assessment includes: Speech disturbances, MRI did confirm acute CVA, HLD, chronic pain syndrome, pulmonary fibrosis, and depression.    PT Comments    Pt presents with deficits in strength, transfers, mobility, gait, balance, and activity tolerance.  Pt required min A with transfers and continues to attempt to sit prior to being oriented properly to the chair.  Pt was able to amb 80' this session with occasional min A for stability and to prevent striking obstacles on the left.  During below balance therex pt continued to require occasional min A to prevent posterior LOB but was grossly improved compared to last session.  Pt will benefit from PT services in a SNF setting upon discharge to safely address above deficits for decreased caregiver assistance and eventual return to PLOF.     Follow Up Recommendations  SNF     Equipment Recommendations  None recommended by PT    Recommendations for Other Services       Precautions / Restrictions Precautions Precautions: Fall Restrictions Weight Bearing Restrictions: No    Mobility  Bed Mobility Overal bed mobility: Modified Independent             General bed mobility comments: Extra time and effort during sup to sit but no physical assistance needed  Transfers Overall transfer level: Needs assistance Equipment used: Rolling walker (2 wheeled) Transfers: Sit  to/from Stand Sit to Stand: Min assist         General transfer comment: VC for hand placement and general sequencing and to prevent pt from attempting to sit prior to being oriented properly to the chair  Ambulation/Gait Ambulation/Gait assistance: Min assist Gait Distance (Feet): 80 Feet Assistive device: Rolling walker (2 wheeled) Gait Pattern/deviations: Step-through pattern;Decreased step length - right;Decreased step length - left;Drifts right/left Gait velocity: decreased   General Gait Details: Improved navigation with the RW this session with only one instance of striking an obstacle on the L; Min A with stability during amb but overall stability grossly improved this session.   Stairs             Wheelchair Mobility    Modified Rankin (Stroke Patients Only)       Balance Overall balance assessment: Needs assistance Sitting-balance support: Feet supported;Single extremity supported Sitting balance-Leahy Scale: Fair     Standing balance support: No upper extremity supported;Bilateral upper extremity supported;During functional activity Standing balance-Leahy Scale: Poor                              Cognition Arousal/Alertness: Awake/alert Behavior During Therapy: WFL for tasks assessed/performed Overall Cognitive Status: No family/caregiver present to determine baseline cognitive functioning                                        Exercises Total Joint Exercises Ankle Circles/Pumps: AROM;Strengthening;Both;10 reps;5 reps Quad Sets:  Strengthening;Both;5 reps;10 reps Gluteal Sets: Strengthening;Both;10 reps Hip ABduction/ADduction: Both;10 reps;Strengthening Straight Leg Raises: Strengthening;Both;10 reps Long Arc Quad: Strengthening;Both;10 reps;15 reps Knee Flexion: Strengthening;Both;10 reps;15 reps Marching in Standing: AROM;Both;10 reps Other Exercises Other Exercises: Static balance training without UE support with  feet apart and semi-tandem and with combinations of eyes open/closed and with posterior purtebations    General Comments        Pertinent Vitals/Pain Pain Assessment: No/denies pain    Home Living                      Prior Function            PT Goals (current goals can now be found in the care plan section) Progress towards PT goals: Progressing toward goals    Frequency    7X/week      PT Plan Current plan remains appropriate    Co-evaluation              AM-PAC PT "6 Clicks" Mobility   Outcome Measure  Help needed turning from your back to your side while in a flat bed without using bedrails?: None Help needed moving from lying on your back to sitting on the side of a flat bed without using bedrails?: None Help needed moving to and from a bed to a chair (including a wheelchair)?: A Little Help needed standing up from a chair using your arms (e.g., wheelchair or bedside chair)?: A Little Help needed to walk in hospital room?: A Little Help needed climbing 3-5 steps with a railing? : A Lot 6 Click Score: 19    End of Session Equipment Utilized During Treatment: Gait belt Activity Tolerance: Patient tolerated treatment well Patient left: in chair;with call bell/phone within reach;with chair alarm set Nurse Communication: Mobility status PT Visit Diagnosis: Unsteadiness on feet (R26.81);Difficulty in walking, not elsewhere classified (R26.2);Muscle weakness (generalized) (M62.81)     Time: 9735-3299 PT Time Calculation (min) (ACUTE ONLY): 26 min  Charges:  $Gait Training: 8-22 mins $Therapeutic Exercise: 8-22 mins                     D. Scott Pearla Mckinny PT, DPT 06/15/19, 11:33 AM

## 2019-06-15 NOTE — Plan of Care (Signed)
Pt speech got better at last reassessment. No current deficits. Will continue to monitor.

## 2019-06-15 NOTE — TOC Transition Note (Addendum)
Transition of Care [facility]) - CM/SW Discharge Note   Patient Details  Name: Melody Ochoa MRN: [id number] Date of Birth: 29-Feb-1941  Transition [facility]) CM/SW Contact:  Latanya Maudlin, RN Phone Number: 06/15/2019, 11:31 AM   Clinical Narrative: Patient to be discharged per MD order. Orders in place for home health services. Patient has progressed enough to not need rehab. She wishes to go home with home health. CMS Medicare.gov Compare Post Acute Care list reviewed with patient and she has recently used [facility] and prefers to use them again. Notified Melissa of referral. No DME needs per patient. Family to transport.    Patient is active with [facility] and has not used [facility] since 2018. Notified Cassie of discharge. Orders in place.      Final next level of care: Home w Home Health Services Barriers to Discharge: No Barriers Identified   Patient Goals and CMS Choice   CMS Medicare.gov Compare Post Acute Care list provided to:: Patient Choice offered to / list presented to : Patient  Discharge Placement                       Discharge Plan and Services     Post Acute Care Choice: Skilled Nursing Facility                    HH Arranged: RN, PT, Nurse's Aide [facility] Agency: [place] ([facility]) Date [facility] Agency Contacted: 06/15/19 Time [place]: 1131 Representative spoke with at [place]: [place] ([place]) Interventions     Readmission Risk Interventions No flowsheet data found.

## 2019-06-18 ENCOUNTER — Telehealth: Payer: Self-pay

## 2019-06-18 NOTE — Telephone Encounter (Signed)
Patient declined transitional care management with pcp at this time. She is scheduled to follow up with neurology.

## 2019-06-20 ENCOUNTER — Telehealth: Payer: Self-pay | Admitting: Family

## 2019-06-20 ENCOUNTER — Other Ambulatory Visit: Payer: Self-pay | Admitting: Internal Medicine

## 2019-06-20 NOTE — Telephone Encounter (Signed)
Melody Ochoa w/[facility] stated that the patient's home health care will start tomorrow, 06/21/2019.

## 2019-06-20 NOTE — Telephone Encounter (Signed)
FYI

## 2019-06-20 NOTE — Telephone Encounter (Signed)
This is a [place] pt 

## 2019-06-20 NOTE — Telephone Encounter (Signed)
noted 

## 2019-06-21 ENCOUNTER — Telehealth: Payer: Self-pay | Admitting: Family

## 2019-06-21 DIAGNOSIS — F329 Major depressive disorder, single episode, unspecified: Secondary | ICD-10-CM | POA: Diagnosis not present

## 2019-06-21 DIAGNOSIS — R52 Pain, unspecified: Secondary | ICD-10-CM | POA: Diagnosis not present

## 2019-06-21 DIAGNOSIS — I69320 Aphasia following cerebral infarction: Secondary | ICD-10-CM | POA: Diagnosis not present

## 2019-06-21 DIAGNOSIS — F419 Anxiety disorder, unspecified: Secondary | ICD-10-CM | POA: Diagnosis not present

## 2019-06-21 DIAGNOSIS — M6281 Muscle weakness (generalized): Secondary | ICD-10-CM | POA: Diagnosis not present

## 2019-06-21 DIAGNOSIS — M5136 Other intervertebral disc degeneration, lumbar region: Secondary | ICD-10-CM | POA: Diagnosis not present

## 2019-06-21 DIAGNOSIS — G894 Chronic pain syndrome: Secondary | ICD-10-CM | POA: Diagnosis not present

## 2019-06-21 DIAGNOSIS — J449 Chronic obstructive pulmonary disease, unspecified: Secondary | ICD-10-CM | POA: Diagnosis not present

## 2019-06-21 DIAGNOSIS — I5022 Chronic systolic (congestive) heart failure: Secondary | ICD-10-CM | POA: Diagnosis not present

## 2019-06-21 DIAGNOSIS — I2510 Atherosclerotic heart disease of native coronary artery without angina pectoris: Secondary | ICD-10-CM | POA: Diagnosis not present

## 2019-06-21 DIAGNOSIS — M48062 Spinal stenosis, lumbar region with neurogenic claudication: Secondary | ICD-10-CM | POA: Diagnosis not present

## 2019-06-21 DIAGNOSIS — I110 Hypertensive heart disease with heart failure: Secondary | ICD-10-CM | POA: Diagnosis not present

## 2019-06-21 DIAGNOSIS — I69398 Other sequelae of cerebral infarction: Secondary | ICD-10-CM | POA: Diagnosis not present

## 2019-06-21 DIAGNOSIS — R2689 Other abnormalities of gait and mobility: Secondary | ICD-10-CM | POA: Diagnosis not present

## 2019-06-21 DIAGNOSIS — J8410 Pulmonary fibrosis, unspecified: Secondary | ICD-10-CM | POA: Diagnosis not present

## 2019-06-21 DIAGNOSIS — M4727 Other spondylosis with radiculopathy, lumbosacral region: Secondary | ICD-10-CM | POA: Diagnosis not present

## 2019-06-21 DIAGNOSIS — Z981 Arthrodesis status: Secondary | ICD-10-CM | POA: Diagnosis not present

## 2019-06-21 NOTE — Telephone Encounter (Signed)
Called and left voice message for [place] at [facility].  Gave verbal orders for PT as requested 2x week for 4 weeks and OT eval.

## 2019-06-21 NOTE — Telephone Encounter (Signed)
Home Health Verbal Orders - Caller/Agency: [place] Number: [phone] Requesting OT/PT/Skilled Nursing/Social Work/Speech Therapy: pt  Frequency: 2 wee 4  Also, ot eval

## 2019-06-22 NOTE — Discharge Summary (Signed)
[place] at [place] NAME: Melody Ochoa    MR#:  [id number]  DATE OF BIRTH:  August 09, 1941  DATE OF ADMISSION:  06/13/2019 ADMITTING PHYSICIAN: Lang Snow, NP  DATE OF DISCHARGE: 06/15/2019  1:16 PM  PRIMARY CARE PHYSICIAN: Burnard Hawthorne, FNP    ADMISSION DIAGNOSIS:  Aphasia [R47.01] Stroke (cerebrum) ([place]) [I63.9]  DISCHARGE DIAGNOSIS:  Active Problems:   Stroke (cerebrum) (HCC)   SECONDARY DIAGNOSIS:   Past Medical History:  Diagnosis Date  . Arthritis   . Atheromatous plaque 10/18/2017  . Back pain   . Cancer ([place])    skin  . Depression   . Diverticulosis   . Dizziness    patient had episode of dizziness when came in room. hx past no dx  . GERD (gastroesophageal reflux disease)   . Heart murmur   . Hyperlipidemia 10/18/2017  . PONV (postoperative nausea and vomiting)    yrs ago  . Stroke [facility])     HOSPITAL COURSE:   78 y.o.femalewith pertinent past medical history ofnormocytic anemia, chronic pain syndrome, hyperlipidemia, CVA s/p tPA (9/18), B12 deficiency, lumbar DDD, depression and pulmonary fibrosis presenting to the ED with chief complaints of word finding difficulty,slurred speech and being"off balance".  1. Speech disturbances - concerns for TIA/Ischemic event in a patient with hx of stroke s/p IV alteplase 9/18 - CT head reviewed and negative for acute intracranial abnormality -HgbA1c- 5.4, fasting lipid panel- LDL- 57 -MRI/MRAof the brain without contrast-confirms acute stroke -PT consult, OT consult, Speech consult -Echocardiogram-done  -Carotid dopplers- no significant blockages. -Prophylactic therapy- Antiplatelet med: Plavix - dose75mg /day, patient is allergic to Aspirin -NPO until RN stroke swallow screen-tolerated diet has swallow evaluation done. -Telemetry monitoring. -Frequent neuro checks - Neurology consult appreciated.   2.HLD  + Goal LDL<100 -  Atorvastatin 80mg  PO qhs  3. Chronic pain syndrome - hx of lumbar DDD - Continue Cymbalta and Lyrica - Lidoderm patch  4. Pulmonary Fibrosis - currently not symptomatic - Follow with pulmonologist Dr. Ashby Dawes - Continue Ventolin  5. Depression - Continue Wellbutrin  6.DVT prophylaxis - Enoxaparin SubQ  Physical therapy suggested patient will need to go to a rehab center.  Patient is agreeable.  DISCHARGE CONDITIONS:   Stable.  CONSULTS OBTAINED:  Treatment Team:  Neville Route, MD Alexis Goodell, MD  DRUG ALLERGIES:   Allergies  Allergen Reactions  . Aleve [Naproxen Sodium] Other (See Comments)    Gi upset  . Aspirin Other (See Comments)    Stomach pain-aggravates diverticulosis  . Celebrex [Celecoxib] Other (See Comments)    Dizziness   . Effexor [Venlafaxine] Other (See Comments)    Hot flashes   . Gabapentin Other (See Comments)    "Night terrors"  . Hydrocodone-Homatropine Diarrhea  . Ibuprofen Other (See Comments)    Gi upset   . Tape Itching and Other (See Comments)    Itchy blisters  . Vioxx [Rofecoxib] Other (See Comments)    Gi upset  . Other Rash and Other (See Comments)    bandaides  . Tramadol Other (See Comments)    hallucinations    DISCHARGE MEDICATIONS:   Allergies as of 06/15/2019      Reactions   Aleve [naproxen Sodium] Other (See Comments)   Gi upset   Aspirin Other (See Comments)   Stomach pain-aggravates diverticulosis   Celebrex [celecoxib] Other (See Comments)   Dizziness   Effexor [venlafaxine] Other (See Comments)   Hot flashes   Gabapentin  Other (See Comments)   "Night terrors"   Hydrocodone-homatropine Diarrhea   Ibuprofen Other (See Comments)   Gi upset   Tape Itching, Other (See Comments)   Itchy blisters   Vioxx [rofecoxib] Other (See Comments)   Gi upset   Other Rash, Other (See Comments)   bandaides   Tramadol Other (See Comments)   hallucinations      Medication List    TAKE these  medications   buPROPion 300 MG 24 hr tablet Commonly known as: WELLBUTRIN XL Take 300 mg by mouth daily.   CALTRATE 600+D PO Take 1 tablet by mouth daily.   clopidogrel 75 MG tablet Commonly known as: PLAVIX TAKE 1 TABLET BY MOUTH EVERY DAY   DULoxetine 60 MG capsule Commonly known as: CYMBALTA Take 60 mg by mouth 2 (two) times daily.   famotidine 20 MG tablet Commonly known as: PEPCID Take 20 mg by mouth at bedtime.   ferrous sulfate 325 (65 FE) MG tablet Take 1 tablet (325 mg total) by mouth daily.   fluticasone 50 MCG/ACT nasal spray Commonly known as: FLONASE Place 1 spray into both nostrils daily as needed for allergies or rhinitis (congestion).   pregabalin 50 MG capsule Commonly known as: Lyrica Take 1 capsule (50 mg total) by mouth 3 (three) times daily.   tiotropium 18 MCG inhalation capsule Commonly known as: SPIRIVA Place 1 capsule (18 mcg total) into inhaler and inhale daily for 30 days.   triamcinolone cream 0.1 % Commonly known as: KENALOG Apply 1 application topically 2 (two) times daily as needed (rash/skin irritation.).   Ventolin HFA 108 (90 Base) MCG/ACT inhaler Generic drug: albuterol TAKE 2 PUFFS BY MOUTH EVERY 6 HOURS AS NEEDED FOR WHEEZE OR SHORTNESS OF BREATH        DISCHARGE INSTRUCTIONS:    Follow with PMD in 1-2 weeks.  If you experience worsening of your admission symptoms, develop shortness of breath, life threatening emergency, suicidal or homicidal thoughts you must seek medical attention immediately by calling 911 or calling your MD immediately  if symptoms less severe.  You Must read complete instructions/literature along with all the possible adverse reactions/side effects for all the Medicines you take and that have been prescribed to you. Take any new Medicines after you have completely understood and accept all the possible adverse reactions/side effects.   Please note  You were cared for by a hospitalist during your  hospital stay. If you have any questions about your discharge medications or the care you received while you were in the hospital after you are discharged, you can call the unit and asked to speak with the hospitalist on call if the hospitalist that took care of you is not available. Once you are discharged, your primary care physician will handle any further medical issues. Please note that NO REFILLS for any discharge medications will be authorized once you are discharged, as it is imperative that you return to your primary care physician (or establish a relationship with a primary care physician if you do not have one) for your aftercare needs so that they can reassess your need for medications and monitor your lab values.    Today   CHIEF COMPLAINT:   Chief Complaint  Patient presents with  . Aphasia    HISTORY OF PRESENT ILLNESS:  Melody Ochoa  is a 78 y.o. female with a known history of normocytic anemia, chronic pain syndrome, hyperlipidemia, CVA s/p tPA (9/18), B12 deficiency, lumbar DDD, depression and pulmonary fibrosis presenting  to the ED with chief complaints of word finding difficulty, slurred speech and being "off balance".  Patient reports that she was feeling unsteady on her feet and somewhat dizzy yesterday prior to going to bed at wround 8:30 pm, she denies any other symptoms. However when she woke up this morning at around 6 am, her husband noticed that her speech was "off" sounding gibberish, slurred and unable to find her words. She still had difficulty walking and moving around. Denies associated altered sensorium, cranial nerve deficit, seizures, focal motor or sensory deficits, diplopia, nausea or vomiting, syncope or LOC, paresthesia (numbness, tingling, pins-and-needles sensation) or a heavy feeling in an extremity.  On arrival to the ED, he was afebrile with blood pressure 146/69 mm Hg and pulse rate 92 beats/min. She was initially noted with right facial droop which  was not apparent on my exam; she was alert and oriented x4, and he did not demonstrate any memory deficits.  Initial NIH stroke scale 0.  Noncontrast CT head was obtained and showed no acute intracranial abnormality.  Labs unremarkable.  The patient has remained free from recurrent events and maintains a secondary prevention medication regimen including 75mg  of Plavix and 80mg  of atorvastatin. Patient will be admitted under hospitalist service for further stroke work up and managment    VITAL SIGNS:  Blood pressure (!) 146/81, pulse 75, temperature 97.9 F (36.6 C), temperature source Oral, resp. rate 18, height 5\' 4"  (1.626 m), weight 91.5 kg, SpO2 97 %.  I/O:  No intake or output data in the 24 hours ending 06/22/19 1543  PHYSICAL EXAMINATION:   GENERAL:  78 y.o.-year-old patient lying in the bed with no acute distress.  EYES: Pupils equal, round, reactive to light and accommodation. No scleral icterus. Extraocular muscles intact.  HEENT: Head atraumatic, normocephalic. Oropharynx and nasopharynx clear.  NECK:  Supple, no jugular venous distention. No thyroid enlargement, no tenderness.  LUNGS: Normal breath sounds bilaterally, no wheezing, rales,rhonchi or crepitation. No use of accessory muscles of respiration.  CARDIOVASCULAR: S1, S2 normal. No murmurs, rubs, or gallops.  ABDOMEN: Soft, nontender, nondistended. Bowel sounds present. No organomegaly or mass.  EXTREMITIES: No pedal edema, cyanosis, or clubbing.  NEUROLOGIC: Cranial nerves II through XII are intact. Muscle strength 5/5 in all extremities. Sensation intact. Gait not checked.  PSYCHIATRIC: The patient is alert and oriented x 3.  SKIN: No obvious rash, lesion, or ulcer.    DATA REVIEW:   CBC No results for input(s): WBC, HGB, HCT, PLT in the last 168 hours.  Chemistries  No results for input(s): NA, K, CL, CO2, GLUCOSE, BUN, CREATININE, CALCIUM, MG, AST, ALT, ALKPHOS, BILITOT in the last 168 hours.  Invalid  input(s): GFRCGP  Cardiac Enzymes No results for input(s): TROPONINI in the last 168 hours.  Microbiology Results  Results for orders placed or performed during the hospital encounter of 06/13/19  SARS Coronavirus 2 (CEPHEID- Performed in [place] hospital lab), Hosp Order     Status: None   Collection Time: 06/13/19 11:07 AM   Specimen: Nasopharyngeal Swab  Result Value Ref Range Status   SARS Coronavirus 2 NEGATIVE NEGATIVE Final    Comment: (NOTE) If result is NEGATIVE SARS-CoV-2 target nucleic acids are NOT DETECTED. The SARS-CoV-2 RNA is generally detectable in upper and lower  respiratory specimens during the acute phase of infection. The lowest  concentration of SARS-CoV-2 viral copies this assay can detect is 250  copies / mL. A negative result does not preclude SARS-CoV-2 infection  and should not be used as the sole basis for treatment or other  patient management decisions.  A negative result may occur with  improper specimen collection / handling, submission of specimen other  than nasopharyngeal swab, presence of viral mutation(s) within the  areas targeted by this assay, and inadequate number of viral copies  (<250 copies / mL). A negative result must be combined with clinical  observations, patient history, and epidemiological information. If result is POSITIVE SARS-CoV-2 target nucleic acids are DETECTED. The SARS-CoV-2 RNA is generally detectable in upper and lower  respiratory specimens dur ing the acute phase of infection.  Positive  results are indicative of active infection with SARS-CoV-2.  Clinical  correlation with patient history and other diagnostic information is  necessary to determine patient infection status.  Positive results do  not rule out bacterial infection or co-infection with other viruses. If result is PRESUMPTIVE POSTIVE SARS-CoV-2 nucleic acids MAY BE PRESENT.   A presumptive positive result was obtained on the submitted specimen  and  confirmed on repeat testing.  While 2019 novel coronavirus  (SARS-CoV-2) nucleic acids may be present in the submitted sample  additional confirmatory testing may be necessary for epidemiological  and / or clinical management purposes  to differentiate between  SARS-CoV-2 and other Sarbecovirus currently known to infect humans.  If clinically indicated additional testing with an alternate test  methodology [phone]) is advised. The SARS-CoV-2 RNA is generally  detectable in upper and lower respiratory sp ecimens during the acute  phase of infection. The expected result is Negative. Fact Sheet for Patients:  StrictlyIdeas.no Fact Sheet for Healthcare Providers: BankingDealers.co.za This test is not yet approved or cleared by the [place] FDA and has been authorized for detection and/or diagnosis of SARS-CoV-2 by FDA under an Emergency Use Authorization (EUA).  This EUA will remain in effect (meaning this test can be used) for the duration of the COVID-19 declaration under Section 564(b)(1) of the Act, 21 U.S.C. section 360bbb-3(b)(1), unless the authorization is terminated or revoked sooner. Performed at [facility], [address] [place], [place] [address]     RADIOLOGY:  No results found.  EKG:   Orders placed or performed during the hospital encounter of 06/13/19  . EKG 12-Lead  . EKG 12-Lead  . ED EKG  . ED EKG      Management plans discussed with the patient, family and they are in agreement.  CODE STATUS:  Code Status History    Date Active Date Inactive Code Status Order ID Comments User Context   06/13/2019 1259 06/15/2019 1621 Full Code [id number]  Lang Snow, NP ED   08/10/2017 0128 08/13/2017 1828 Full Code [id number]  Greta Doom, MD Inpatient   11/22/2014 0347 11/25/2014 1750 Full Code [id number]  [name], MD Inpatient   11/18/2014 1859 11/20/2014 1446 Full Code  [id number]  Newman Pies, MD Inpatient   Advance Care Planning Activity    Advance Directive Documentation     Most Recent Value  Type of Advance Directive  Healthcare Power of Attorney, Living will  Pre-existing out of facility DNR order (yellow form or pink MOST form)  -  "MOST" Form in Place?  -      TOTAL TIME TAKING CARE OF THIS PATIENT: 35 minutes.    Vaughan Basta M.D on 06/22/2019 at 3:43 PM  Between 7am to 6pm - Pager - [phone]  After 6pm go to [email or web address] - password [email or web address]  [employer]  Office  [phone]  CC: Primary care physician; Burnard Hawthorne, FNP   Note: This dictation was prepared with Dragon dictation along with smaller phrase technology. Any transcriptional errors that result from this process are unintentional.

## 2019-06-25 ENCOUNTER — Emergency Department
Admission: EM | Admit: 2019-06-25 | Discharge: 2019-06-25 | Discharge status: Against Medical Advice | Payer: Medicare Other | Attending: Emergency Medicine | Admitting: Emergency Medicine

## 2019-06-25 ENCOUNTER — Telehealth: Payer: Self-pay | Admitting: Family

## 2019-06-25 ENCOUNTER — Other Ambulatory Visit: Payer: Self-pay

## 2019-06-25 ENCOUNTER — Ambulatory Visit: Payer: Self-pay | Admitting: Family

## 2019-06-25 DIAGNOSIS — Z5321 Procedure and treatment not carried out due to patient leaving prior to being seen by health care provider: Secondary | ICD-10-CM | POA: Diagnosis not present

## 2019-06-25 DIAGNOSIS — I69320 Aphasia following cerebral infarction: Secondary | ICD-10-CM | POA: Diagnosis not present

## 2019-06-25 DIAGNOSIS — I69398 Other sequelae of cerebral infarction: Secondary | ICD-10-CM | POA: Diagnosis not present

## 2019-06-25 DIAGNOSIS — J8410 Pulmonary fibrosis, unspecified: Secondary | ICD-10-CM | POA: Diagnosis not present

## 2019-06-25 DIAGNOSIS — G894 Chronic pain syndrome: Secondary | ICD-10-CM | POA: Diagnosis not present

## 2019-06-25 DIAGNOSIS — M5136 Other intervertebral disc degeneration, lumbar region: Secondary | ICD-10-CM | POA: Diagnosis not present

## 2019-06-25 DIAGNOSIS — Z8673 Personal history of transient ischemic attack (TIA), and cerebral infarction without residual deficits: Secondary | ICD-10-CM | POA: Diagnosis not present

## 2019-06-25 DIAGNOSIS — M6281 Muscle weakness (generalized): Secondary | ICD-10-CM | POA: Diagnosis not present

## 2019-06-25 DIAGNOSIS — I10 Essential (primary) hypertension: Secondary | ICD-10-CM | POA: Diagnosis present

## 2019-06-25 LAB — CBC WITH DIFFERENTIAL/PLATELET
Abs Immature Granulocytes: 0.03 10*3/uL (ref 0.00–0.07)
Basophils Absolute: 0.1 10*3/uL (ref 0.0–0.1)
Basophils Relative: 1 %
Eosinophils Absolute: 0.3 10*3/uL (ref 0.0–0.5)
Eosinophils Relative: 5 %
HCT: 33.9 % — ABNORMAL LOW (ref 36.0–46.0)
Hemoglobin: 10.9 g/dL — ABNORMAL LOW (ref 12.0–15.0)
Immature Granulocytes: 1 %
Lymphocytes Relative: 25 %
Lymphs Abs: 1.7 10*3/uL (ref 0.7–4.0)
MCH: 27.9 pg (ref 26.0–34.0)
MCHC: 32.2 g/dL (ref 30.0–36.0)
MCV: 86.9 fL (ref 80.0–100.0)
Monocytes Absolute: 0.6 10*3/uL (ref 0.1–1.0)
Monocytes Relative: 9 %
Neutro Abs: 3.9 10*3/uL (ref 1.7–7.7)
Neutrophils Relative %: 59 %
Platelets: 287 10*3/uL (ref 150–400)
RBC: 3.9 MIL/uL (ref 3.87–5.11)
RDW: 14.8 % (ref 11.5–15.5)
WBC: 6.6 10*3/uL (ref 4.0–10.5)
nRBC: 0 % (ref 0.0–0.2)

## 2019-06-25 LAB — COMPREHENSIVE METABOLIC PANEL
ALT: 19 U/L (ref 0–44)
AST: 22 U/L (ref 15–41)
Albumin: 4.2 g/dL (ref 3.5–5.0)
Alkaline Phosphatase: 56 U/L (ref 38–126)
Anion gap: 10 (ref 5–15)
BUN: 15 mg/dL (ref 8–23)
CO2: 26 mmol/L (ref 22–32)
Calcium: 9.3 mg/dL (ref 8.9–10.3)
Chloride: 103 mmol/L (ref 98–111)
Creatinine, Ser: 0.92 mg/dL (ref 0.44–1.00)
GFR calc Af Amer: >60 mL/min (ref >60)
GFR calc non Af Amer: 59 mL/min — ABNORMAL LOW (ref >60)
Glucose, Bld: 87 mg/dL (ref 70–99)
Potassium: 3.7 mmol/L (ref 3.5–5.1)
Sodium: 139 mmol/L (ref 135–145)
Total Bilirubin: 0.5 mg/dL (ref 0.3–1.2)
Total Protein: 7.4 g/dL (ref 6.5–8.1)

## 2019-06-25 NOTE — Telephone Encounter (Signed)
Home Health Verbal Orders - Caller/Agency: [facility] Callback Number: [phone] Lattie Haw Requesting OT/PT/Skilled Nursing/Social Work/Speech Therapy: OT Frequency: 2 Times a week for 3 weeks

## 2019-06-25 NOTE — ED Notes (Signed)
Pt called to recheck BP and reassess, no response. RN also looked outside, no response.

## 2019-06-25 NOTE — Telephone Encounter (Signed)
Received call from [facility] [place].'  States pt not present. Reports when at pts home earlier pt's BP was 162/90, denied H/A at that time.  Pt is not on any BP meds.   Lilia Pro states she left home and CB pt to recheck BP, 164/90 per home monitor. Pt stated she had a mild headache.  Attempted to reach pt for further assessment, left VM to CB.  Pts CB# [phone]

## 2019-06-25 NOTE — ED Notes (Addendum)
Full rainbow drawn  

## 2019-06-25 NOTE — ED Notes (Signed)
Pt did not tell this nurse that she had a stroke last week - this pt was changed to level 3 and moved back to main wait

## 2019-06-25 NOTE — ED Notes (Signed)
Pt called for BP recheck, no response

## 2019-06-25 NOTE — ED Triage Notes (Signed)
Pt reports that BP at home was 175/105 and she has hx of TIA's so she came to be "checked out" - c/o headache - denies any other symptoms of blurred vision, weakness, difficulty with speech, numbness

## 2019-06-25 NOTE — ED Notes (Signed)
Pt called for BP recheck and reassessment, no response.

## 2019-06-26 NOTE — Telephone Encounter (Signed)
Pt returned call back from office.  Asked pt if she had gone to UC or ED today.  Pt said that she would "rather have a stroke at home than to wait in the ER to be seen".  Pt said that she "hates that place" and that waiting in the ER makes her "blood pressure goes up".  Pt said that she just checked her blood pressure and it is 132/76.  Patient denies having any symptoms.  Pt said that she has no symptoms that are concerning and said that she has an appt with PCP [name] tomorrow morning.    Advised pt to go to ED if symptoms worsen.  Will forward to covering provider for review and PCP as FYI.

## 2019-06-26 NOTE — Telephone Encounter (Signed)
I called left patient a detailed message urging her to head to the ED. I also asked that she call us back & let us know how she is doing. I LM on both cell & home phones.

## 2019-06-26 NOTE — Telephone Encounter (Signed)
Called and spoke to pt this morning.  Pt said that her bp went from 164/90 to 175/105 yesterday.  Pt said that she went to the ER around 5:00 pm yesterday but left and went home after waiting for awhile and not being seen.  Pt said that she is not taking bp meds and mentioned that a doctor that she has seen in the past had mentioned putting her on a diuretic.    I had pt recheck her bp this morning while she was on the phone with me.  Pt said bp machine reading was 132/78.  Pt denied having any symptoms.  I offered appt w/ [name], FNP today since PCP is not in the office today.  Pt declined stating that she had some people coming over to clean today.  Pt preferred appt w/ her PCP Melody Ochoa, [place]. Pt scheduled appt w/ Melody Ochoa for tomorrow (06/27/19) @ 10:00 am.    Advised pt to monitor and check her bp throughout the day and advised pt to go to [facility] or ED if bp gets elevated again.  Will forward notes to PCP as FYI and covering provider for review.

## 2019-06-26 NOTE — Telephone Encounter (Signed)
Do you mind calling and following up with this patient? This is the one I spoke with you about.

## 2019-06-26 NOTE — Telephone Encounter (Signed)
Please call and confirm pt is doing ok.  She is a patient of [name].  In reviewing chart, recently admitted with stroke.  Agree with evaluation today.  Apparently she declined.  Confirm no acute issues.  It appears BP is better today. Still agree with evaluation today.

## 2019-06-26 NOTE — Telephone Encounter (Signed)
FYI.  Pt declined evaluation today.

## 2019-06-27 ENCOUNTER — Other Ambulatory Visit: Payer: Self-pay

## 2019-06-27 ENCOUNTER — Ambulatory Visit (INDEPENDENT_AMBULATORY_CARE_PROVIDER_SITE_OTHER): Payer: Medicare Other | Admitting: Family

## 2019-06-27 ENCOUNTER — Other Ambulatory Visit: Payer: Self-pay | Admitting: Internal Medicine

## 2019-06-27 ENCOUNTER — Encounter: Payer: Self-pay | Admitting: Family

## 2019-06-27 ENCOUNTER — Ambulatory Visit: Payer: Medicare Other | Admitting: Family

## 2019-06-27 ENCOUNTER — Other Ambulatory Visit: Payer: Self-pay | Admitting: Family

## 2019-06-27 VITALS — BP 130/72 | HR 97 | Temp 97.6°F | Wt 203.8 lb

## 2019-06-27 DIAGNOSIS — I639 Cerebral infarction, unspecified: Secondary | ICD-10-CM | POA: Diagnosis not present

## 2019-06-27 DIAGNOSIS — I63219 Cerebral infarction due to unspecified occlusion or stenosis of unspecified vertebral arteries: Secondary | ICD-10-CM | POA: Diagnosis not present

## 2019-06-27 DIAGNOSIS — D649 Anemia, unspecified: Secondary | ICD-10-CM | POA: Diagnosis not present

## 2019-06-27 DIAGNOSIS — R05 Cough: Secondary | ICD-10-CM

## 2019-06-27 DIAGNOSIS — R058 Other specified cough: Secondary | ICD-10-CM

## 2019-06-27 DIAGNOSIS — R030 Elevated blood-pressure reading, without diagnosis of hypertension: Secondary | ICD-10-CM

## 2019-06-27 DIAGNOSIS — J8410 Pulmonary fibrosis, unspecified: Secondary | ICD-10-CM

## 2019-06-27 MED ORDER — AMLODIPINE BESYLATE 2.5 MG PO TABS: 2.5000 mg | ORAL_TABLET | Freq: Every day | ORAL | 3 refills | Status: DC | PRN

## 2019-06-27 NOTE — Assessment & Plan Note (Addendum)
Reviewed hospitalization with patient today.  She will continue aspirin, Plavix and also PT/OT at home.

## 2019-06-27 NOTE — Assessment & Plan Note (Addendum)
Blood pressure in office is overall controlled.  Normal neurologic exam.  Patient has known symptoms to indicate hypertensive urgency or emergency at this time.  We jointly agreed amlodipine with parameters only , for blood pressure > 140/90.  Also emphasized the importance of continue following with Dr. Caryl Comes in particular for echocardiogram which revealed low normal systolic function. patient verbalized understanding of all and will let me know of any new concerns or symptoms.

## 2019-06-27 NOTE — Patient Instructions (Signed)
Stool cards - please bring back  Labs for anemia- scheduled.  Monitor blood pressure,  Goal is less than 140/90,  if persistently higher,  Please take amlodipine 2.5mg  once and call the office to let us know.   Please return to emergency room for any concerns of stroke, heart attack.   Stay safe!    Heart Attack A heart attack occurs when blood and oxygen supply to the heart is cut off. A heart attack causes damage to the heart that cannot be fixed. A heart attack is also called a myocardial infarction, or MI. If you think you are having a heart attack, do not wait to see if the symptoms will go away. Get medical help right away. What are the causes? This condition may be caused by:  A fatty substance (plaque) in the blood vessels (arteries). This can block the flow of blood to the heart.  A blood clot in the blood vessels that go to the heart. The blood clot blocks blood flow.  Low blood pressure.  An abnormal heartbeat.  Some diseases, such as problems in red blood cells (anemia)orproblems in breathing (respiratory failure).  Tightening (spasm) of a blood vessel that cuts off blood to the heart.  A tear in a blood vessel of the heart.  High blood pressure. What increases the risk? The following factors may make you more likely to develop this condition:  Aging. The older you are, the higher your risk.  Having a personal or family history of chest pain, heart attack, stroke, or narrowing of the arteries in the legs, arms, head, or stomach (peripheral artery disease).  Being female.  Smoking.  Not getting regular exercise.  Being overweight or obese.  Having high blood pressure.  Having high cholesterol.  Having diabetes.  Drinking too much alcohol.  Using illegal drugs, such as cocaine or methamphetamine. What are the signs or symptoms? Symptoms of this condition include:  Chest pain. It may feel like: ? Crushing or squeezing. ? Tightness, pressure,  fullness, or heaviness.  Pain in the arm, neck, jaw, back, or upper body.  Shortness of breath.  Heartburn.  Upset stomach (indigestion).  Feeling like you may vomit (nauseous).  Cold sweats.  Feeling tired.  Sudden light-headedness. How is this treated? A heart attack must be treated as soon as possible. Treatment may include:  Medicines to: ? Break up or dissolve blood clots. ? Thin blood and help prevent blood clots. ? Treat blood pressure. ? Improve blood flow to the heart. ? Reduce pain. ? Reduce cholesterol.  Procedures to widen a blocked artery and keep it open.  Open heart surgery.  Receiving oxygen.  Making your heart strong again (cardiac rehabilitation) through exercise, education, and counseling. Follow these instructions at home: Medicines  Take over-the-counter and prescription medicines only as told by your doctor. You may need to take medicine: ? To keep your blood from clotting too easily. ? To control blood pressure. ? To lower cholesterol. ? To control heart rhythms.  Do not take these medicines unless your doctor says it is okay: ? NSAIDs, such as ibuprofen. ? Supplements that have vitamin A, vitamin E, or both. ? Hormone replacement therapy that has estrogen with or without progestin. Lifestyle      Do not use any products that have nicotine or tobacco, such as cigarettes, e-cigarettes, and chewing tobacco. If you need help quitting, ask your doctor.  Avoid secondhand smoke.  Exercise regularly. Ask your doctor about a cardiac rehab  program.  Eat heart-healthy foods. Your doctor will tell you what foods to eat.  Stay at a healthy weight.  Lower your stress level.  Do not use illegal drugs. Alcohol use  Do not drink alcohol if: ? Your doctor tells you not to drink. ? You are pregnant, may be pregnant, or are planning to become pregnant.  If you drink alcohol: ? Limit how much you use to:  0-1 drink a day for women.  0-2  drinks a day for men. ? Know how much alcohol is in your drink. In the [place] one drink equals one 12 oz bottle of beer (355 mL), one 5 oz glass of wine (148 mL), or one 1 oz glass of hard liquor (44 mL). General instructions  Work with your doctor to treat other problems you may have, such as diabetes or high blood pressure.  Get screened for depression. Get treatment if needed.  Keep your vaccines up to date. Get the flu shot (influenza vaccine) every year.  Keep all follow-up visits as told by your doctor. This is important. Contact a doctor if:  You feel very sad.  You have trouble doing your daily activities. Get help right away if:  You have sudden, unexplained discomfort in your chest, arms, back, neck, jaw, or upper body.  You have shortness of breath.  You have sudden sweating or clammy skin.  You feel like you may vomit.  You vomit.  You feel tired or weak.  You get light-headed or dizzy.  You feel your heart beating fast.  You feel your heart skipping beats.  You have blood pressure that is higher than 180/120. These symptoms may be an emergency. Do not wait to see if the symptoms will go away. Get medical help right away. Call your local emergency services (911 in the [place] Do not drive yourself to the hospital. Summary  A heart attack occurs when blood and oxygen supply to the heart is cut off.  Do not take NSAIDs unless your doctor says it is okay.  Do not smoke. Avoid secondhand smoke.  Exercise regularly. Ask your doctor about a cardiac rehab program. This information is not intended to replace advice given to you by your health care provider. Make sure you discuss any questions you have with your health care provider. Document Released: 05/23/2012 Document Revised: 03/05/2019 Document Reviewed: 03/05/2019 Elsevier Patient Education  [employer].  Stroke Prevention Some medical conditions and behaviors are associated with a higher chance of  having a stroke. You can help prevent a stroke by making nutrition, lifestyle, and other changes, including managing any medical conditions you may have. What nutrition changes can be made?   Eat healthy foods. You can do this by: ? Choosing foods high in fiber, such as fresh fruits and vegetables and whole grains. ? Eating at least 5 or more servings of fruits and vegetables a day. Try to fill half of your plate at each meal with fruits and vegetables. ? Choosing lean protein foods, such as lean cuts of meat, poultry without skin, fish, tofu, beans, and nuts. ? Eating low-fat dairy products. ? Avoiding foods that are high in salt (sodium). This can help lower blood pressure. ? Avoiding foods that have saturated fat, trans fat, and cholesterol. This can help prevent high cholesterol. ? Avoiding processed and premade foods.  Follow your health care provider's specific guidelines for losing weight, controlling high blood pressure (hypertension), lowering high cholesterol, and managing diabetes. These may include: ?  Reducing your daily calorie intake. ? Limiting your daily sodium intake to 1,500 milligrams (mg). ? Using only healthy fats for cooking, such as olive oil, canola oil, or sunflower oil. ? Counting your daily carbohydrate intake. What lifestyle changes can be made?  Maintain a healthy weight. Talk to your health care provider about your ideal weight.  Get at least 30 minutes of moderate physical activity at least 5 days a week. Moderate activity includes brisk walking, biking, and swimming.  Do not use any products that contain nicotine or tobacco, such as cigarettes and e-cigarettes. If you need help quitting, ask your health care provider. It may also be helpful to avoid exposure to secondhand smoke.  Limit alcohol intake to no more than 1 drink a day for nonpregnant women and 2 drinks a day for men. One drink equals 12 oz of beer, 5 oz of wine, or 1 oz of hard liquor.  Stop  any illegal drug use.  Avoid taking birth control pills. Talk to your health care provider about the risks of taking birth control pills if: ? You are over 47 years old. ? You smoke. ? You get migraines. ? You have ever had a blood clot. What other changes can be made?  Manage your cholesterol levels. ? Eating a healthy diet is important for preventing high cholesterol. If cholesterol cannot be managed through diet alone, you may also need to take medicines. ? Take any prescribed medicines to control your cholesterol as told by your health care provider.  Manage your diabetes. ? Eating a healthy diet and exercising regularly are important parts of managing your blood sugar. If your blood sugar cannot be managed through diet and exercise, you may need to take medicines. ? Take any prescribed medicines to control your diabetes as told by your health care provider.  Control your hypertension. ? To reduce your risk of stroke, try to keep your blood pressure below 130/80. ? Eating a healthy diet and exercising regularly are an important part of controlling your blood pressure. If your blood pressure cannot be managed through diet and exercise, you may need to take medicines. ? Take any prescribed medicines to control hypertension as told by your health care provider. ? Ask your health care provider if you should monitor your blood pressure at home. ? Have your blood pressure checked every year, even if your blood pressure is normal. Blood pressure increases with age and some medical conditions.  Get evaluated for sleep disorders (sleep apnea). Talk to your health care provider about getting a sleep evaluation if you snore a lot or have excessive sleepiness.  Take over-the-counter and prescription medicines only as told by your health care provider. Aspirin or blood thinners (antiplatelets or anticoagulants) may be recommended to reduce your risk of forming blood clots that can lead to stroke.   Make sure that any other medical conditions you have, such as atrial fibrillation or atherosclerosis, are managed. What are the warning signs of a stroke? The warning signs of a stroke can be easily remembered as BEFAST.  B is for balance. Signs include: ? Dizziness. ? Loss of balance or coordination. ? Sudden trouble walking.  E is for eyes. Signs include: ? A sudden change in vision. ? Trouble seeing.  F is for face. Signs include: ? Sudden weakness or numbness of the face. ? The face or eyelid drooping to one side.  A is for arms. Signs include: ? Sudden weakness or numbness of the arm, usually  on one side of the body.  S is for speech. Signs include: ? Trouble speaking (aphasia). ? Trouble understanding.  T is for time. ? These symptoms may represent a serious problem that is an emergency. Do not wait to see if the symptoms will go away. Get medical help right away. Call your local emergency services (911 in the [place] Do not drive yourself to the hospital.  Other signs of stroke may include: ? A sudden, severe headache with no known cause. ? Nausea or vomiting. ? Seizure. Where to find more information For more information, visit:  American Stroke Association: www.strokeassociation.org  National Stroke Association: www.stroke.org Summary  You can prevent a stroke by eating healthy, exercising, not smoking, limiting alcohol intake, and managing any medical conditions you may have.  Do not use any products that contain nicotine or tobacco, such as cigarettes and e-cigarettes. If you need help quitting, ask your health care provider. It may also be helpful to avoid exposure to secondhand smoke.  Remember BEFAST for warning signs of stroke. Get help right away if you or a loved one has any of these signs. This information is not intended to replace advice given to you by your health care provider. Make sure you discuss any questions you have with your health care  provider. Document Released: 12/30/2004 Document Revised: 11/04/2017 Document Reviewed: 12/28/2016 Elsevier Patient Education  2020 [employer].

## 2019-06-27 NOTE — Progress Notes (Signed)
Subjective:    Patient ID: Melody Ochoa, female    DOB: 1941-09-27, 78 y.o.   MRN: [id number]  CC: Melody Ochoa is a 78 y.o. female who presents today for follow up.   HPI: Feels well today.   Continues to feel occasionally trouble to find some words and her singing voice has changed.  No trouble swallowing.  Some difficulty in writing  Here today for concerns of elevated blood pressure. BP  cuff calibrated at visit with PT. BP at home 142/72.  Doing home PT/OT  Loop recorder- follows with Dr Caryl Comes      Denies exertional chest pain or pressure, numbness or tingling radiating to left arm or jaw, palpitations, dizziness, frequent headaches, changes in vision, or shortness of breath.   Admitted 06/13/19 for speech disturbances; diagnosis aphasia, stroke; discharged 06/15/19 Consulted dr Creig Hines  carotid duplex- no significant blockages Echo- low normal systolic function. EF 50-55%; left atrial mildly dilated. MVR mild to moderate MRI/MRA - acute stroke right posterior frontal cortical and subcortical  PT /OT/Speech On ASA, plavix.   HLD- on atorvastatin H/o CVA  H/o anemia-patient states "her whole life".  No bleeding per rectum or melana stools.  Colonoscopy 2017  HISTORY:  Past Medical History:  Diagnosis Date   Arthritis    Atheromatous plaque 10/18/2017   Back pain    Cancer ([place])    skin   Depression    Diverticulosis    Dizziness    patient had episode of dizziness when came in room. hx past no dx   GERD (gastroesophageal reflux disease)    Heart murmur    Hyperlipidemia 10/18/2017   PONV (postoperative nausea and vomiting)    yrs ago   Stroke [facility])    Past Surgical History:  Procedure Laterality Date   BACK SURGERY  13   BREAST BIOPSY Bilateral    cores "years ago"   BREAST EXCISIONAL BIOPSY Left 90's   CATARACT EXTRACTION W/PHACO Left 12/20/2018   Procedure: CATARACT EXTRACTION PHACO AND INTRAOCULAR LENS PLACEMENT ([place]) LEFT  TOPICAL;  Surgeon: Leandrew Koyanagi, MD;  Location: [place];  Service: Ophthalmology;  Laterality: Left;   CATARACT EXTRACTION W/PHACO Right 01/24/2019   Procedure: CATARACT EXTRACTION PHACO AND INTRAOCULAR LENS PLACEMENT ([place]) RIGHT;  Surgeon: Leandrew Koyanagi, MD;  Location: [place];  Service: Ophthalmology;  Laterality: Right;   CERVICAL DISC SURGERY  09   COLONOSCOPY WITH PROPOFOL N/A 01/13/2016   Procedure: COLONOSCOPY WITH PROPOFOL;  Surgeon: [name], MD;  Location: [facility] ENDOSCOPY;  Service: Endoscopy;  Laterality: N/A;   FACELIFT  94   LOOP RECORDER INSERTION N/A 08/23/2017   Procedure: LOOP RECORDER INSERTION;  Surgeon: Thompson Grayer, MD;  Location: [place] CV LAB;  Service: Cardiovascular;  Laterality: N/A;   TEE WITHOUT CARDIOVERSION N/A 08/12/2017   Procedure: TRANSESOPHAGEAL ECHOCARDIOGRAM (TEE);  Surgeon: Sueanne Margarita, MD;  Location: [facility] ENDOSCOPY;  Service: Cardiovascular;  Laterality: N/A;   TONSILLECTOMY     TOTAL HIP ARTHROPLASTY Right 04/22/2016   Procedure: TOTAL HIP ARTHROPLASTY;  Surgeon: Corky Mull, MD;  Location: [facility] ORS;  Service: Orthopedics;  Laterality: Right;   Family History  Problem Relation Age of Onset   Lung cancer Father    Aneurysm Brother    Stroke Brother    Diabetes Maternal Grandmother    Kidney disease Maternal Grandfather    Cushing syndrome Paternal Grandmother    Dementia Paternal Grandfather    Breast cancer Maternal Aunt 82  Allergies: Aleve [naproxen sodium], Aspirin, Celebrex [celecoxib], Effexor [venlafaxine], Gabapentin, Hydrocodone-homatropine, Ibuprofen, Tape, Vioxx [rofecoxib], Other, and Tramadol Current Outpatient Medications on File Prior to Visit  Medication Sig Dispense Refill   atorvastatin (LIPITOR) 80 MG tablet Take 1 tablet (80 mg total) by mouth daily at 6 PM. 90 tablet 0   buPROPion (WELLBUTRIN XL) 300 MG 24 hr tablet Take 300 mg by mouth daily.      Calcium  Carbonate-Vitamin D (CALTRATE 600+D PO) Take 1 tablet by mouth daily.     clopidogrel (PLAVIX) 75 MG tablet TAKE 1 TABLET BY MOUTH EVERY DAY 30 tablet 11   DULoxetine (CYMBALTA) 60 MG capsule Take 60 mg by mouth 2 (two) times daily.      famotidine (PEPCID) 20 MG tablet Take 20 mg by mouth at bedtime.   12   ferrous sulfate 325 (65 FE) MG tablet Take 1 tablet (325 mg total) by mouth daily. 30 tablet 0   fluticasone (FLONASE) 50 MCG/ACT nasal spray Place 1 spray into both nostrils daily as needed for allergies or rhinitis (congestion).      triamcinolone cream (KENALOG) 0.1 % Apply 1 application topically 2 (two) times daily as needed (rash/skin irritation.).   0   VENTOLIN HFA 108 (90 Base) MCG/ACT inhaler TAKE 2 PUFFS BY MOUTH EVERY 6 HOURS AS NEEDED FOR WHEEZE OR SHORTNESS OF BREATH 18 g 0   pregabalin (LYRICA) 50 MG capsule Take 1 capsule (50 mg total) by mouth 3 (three) times daily. 90 capsule 2   tiotropium (SPIRIVA) 18 MCG inhalation capsule Place 1 capsule (18 mcg total) into inhaler and inhale daily for 30 days. 30 capsule 12   No current facility-administered medications on file prior to visit.     Social History   Tobacco Use   Smoking status: Former Smoker    Packs/day: 1.00    Years: 40.00    Pack years: 40.00    Types: Cigarettes    Quit date: 11/13/1996    Years since quitting: 22.6   Smokeless tobacco: Never Used  Substance Use Topics   Alcohol use: Yes    Comment: occ wine   Drug use: No    Review of Systems  Constitutional: Negative for chills, fever and unexpected weight change.  HENT: Negative for congestion.   Respiratory: Negative for cough.   Cardiovascular: Negative for chest pain, palpitations and leg swelling.  Gastrointestinal: Negative for nausea and vomiting.  Musculoskeletal: Negative for arthralgias and myalgias.  Skin: Negative for rash.  Neurological: Negative for headaches.  Hematological: Negative for adenopathy.    Psychiatric/Behavioral: Negative for confusion.      Objective:    BP 130/72    Pulse 97    Temp 97.6 F (36.4 C)    Wt 203 lb 12.8 oz (92.4 kg)    SpO2 99%    BMI 34.98 kg/m  BP Readings from Last 3 Encounters:  06/27/19 130/72  06/15/19 (!) 146/81  02/21/19 (!) 144/81   Wt Readings from Last 3 Encounters:  06/27/19 203 lb 12.8 oz (92.4 kg)  06/13/19 201 lb 11.5 oz (91.5 kg)  02/21/19 201 lb 11.5 oz (91.5 kg)    Physical Exam Vitals signs reviewed.  Constitutional:      Appearance: She is well-developed.  HENT:     Mouth/Throat:     Pharynx: Uvula midline.  Eyes:     Conjunctiva/sclera: Conjunctivae normal.     Pupils: Pupils are equal, round, and reactive to light.  Comments: Fundus normal bilaterally.   Cardiovascular:     Rate and Rhythm: Normal rate and regular rhythm.     Pulses: Normal pulses.     Heart sounds: Normal heart sounds.  Pulmonary:     Effort: Pulmonary effort is normal.     Breath sounds: Normal breath sounds. No wheezing, rhonchi or rales.  Skin:    General: Skin is warm and dry.  Neurological:     Mental Status: She is alert.     Cranial Nerves: No cranial nerve deficit.     Sensory: No sensory deficit.     Deep Tendon Reflexes:     Reflex Scores:      Bicep reflexes are 2+ on the right side and 2+ on the left side.      Patellar reflexes are 2+ on the right side and 2+ on the left side.    Comments: Face appears symmetrical with smile.  Speech is clear, not garbled.  Grip equal and strong bilateral upper extremities. Gait strong and steady. Able to perform rapid alternating movement without difficulty.   Psychiatric:        Speech: Speech normal.        Behavior: Behavior normal.        Thought Content: Thought content normal.        Assessment & Plan:   Problem List Items Addressed This Visit      Cardiovascular and Mediastinum   Stroke (cerebrum) [facility])    Reviewed hospitalization with patient today.  She will continue  aspirin, Plavix and also PT/OT at home.       Relevant Medications   amLODipine (NORVASC) 2.5 MG tablet     Other   Anemia    Pending stool cards , repeat cbc      Relevant Orders   Fecal occult blood, imunochemical   CBC with Differential/Platelet   Elevated blood pressure reading - Primary    Blood pressure in office is overall controlled.  Normal neurologic exam.  Patient has known symptoms to indicate hypertensive urgency or emergency at this time.  We jointly agreed amlodipine with parameters only , for blood pressure > 140/90.  Also emphasized the importance of continue following with Dr. Caryl Comes in particular for echocardiogram which revealed low normal systolic function. patient verbalized understanding of all and will let me know of any new concerns or symptoms.       Relevant Medications   amLODipine (NORVASC) 2.5 MG tablet       I am having Melody Ochoa start on amLODipine. I am also having her maintain her DULoxetine, Calcium Carbonate-Vitamin D (CALTRATE 600+D PO), buPROPion, ferrous sulfate, triamcinolone cream, famotidine, tiotropium, fluticasone, clopidogrel, pregabalin, Ventolin HFA, and atorvastatin.   Meds ordered this encounter  Medications   amLODipine (NORVASC) 2.5 MG tablet    Sig: Take 1 tablet (2.5 mg total) by mouth daily as needed. If blood pressure greater than 140/90.    Dispense:  90 tablet    Refill:  3    Order Specific Question:   Supervising Provider    Answer:   Crecencio Mc [[id number]]    Return precautions given.   Risks, benefits, and alternatives of the medications and treatment plan prescribed today were discussed, and patient expressed understanding.   Education regarding symptom management and diagnosis given to patient on AVS.  Continue to follow with Burnard Hawthorne, FNP for routine health maintenance.   Melody Ochoa and I agreed with plan.  [name], FNP

## 2019-06-27 NOTE — Assessment & Plan Note (Signed)
Pending stool cards , repeat cbc

## 2019-06-28 DIAGNOSIS — I69320 Aphasia following cerebral infarction: Secondary | ICD-10-CM | POA: Diagnosis not present

## 2019-06-28 DIAGNOSIS — J8410 Pulmonary fibrosis, unspecified: Secondary | ICD-10-CM | POA: Diagnosis not present

## 2019-06-28 DIAGNOSIS — M6281 Muscle weakness (generalized): Secondary | ICD-10-CM | POA: Diagnosis not present

## 2019-06-28 DIAGNOSIS — M5136 Other intervertebral disc degeneration, lumbar region: Secondary | ICD-10-CM | POA: Diagnosis not present

## 2019-06-28 DIAGNOSIS — G894 Chronic pain syndrome: Secondary | ICD-10-CM | POA: Diagnosis not present

## 2019-06-28 DIAGNOSIS — I69398 Other sequelae of cerebral infarction: Secondary | ICD-10-CM | POA: Diagnosis not present

## 2019-06-29 DIAGNOSIS — M5136 Other intervertebral disc degeneration, lumbar region: Secondary | ICD-10-CM | POA: Diagnosis not present

## 2019-06-29 DIAGNOSIS — I69398 Other sequelae of cerebral infarction: Secondary | ICD-10-CM | POA: Diagnosis not present

## 2019-06-29 DIAGNOSIS — M6281 Muscle weakness (generalized): Secondary | ICD-10-CM | POA: Diagnosis not present

## 2019-06-29 DIAGNOSIS — I69320 Aphasia following cerebral infarction: Secondary | ICD-10-CM | POA: Diagnosis not present

## 2019-06-29 DIAGNOSIS — J8410 Pulmonary fibrosis, unspecified: Secondary | ICD-10-CM | POA: Diagnosis not present

## 2019-06-29 DIAGNOSIS — G894 Chronic pain syndrome: Secondary | ICD-10-CM | POA: Diagnosis not present

## 2019-07-02 ENCOUNTER — Ambulatory Visit (INDEPENDENT_AMBULATORY_CARE_PROVIDER_SITE_OTHER): Payer: Medicare Other | Admitting: *Deleted

## 2019-07-02 DIAGNOSIS — I63219 Cerebral infarction due to unspecified occlusion or stenosis of unspecified vertebral arteries: Secondary | ICD-10-CM

## 2019-07-03 ENCOUNTER — Ambulatory Visit (INDEPENDENT_AMBULATORY_CARE_PROVIDER_SITE_OTHER): Payer: Medicare Other | Admitting: Family Medicine

## 2019-07-03 ENCOUNTER — Other Ambulatory Visit: Payer: Self-pay

## 2019-07-03 ENCOUNTER — Telehealth: Payer: Self-pay

## 2019-07-03 VITALS — BP 134/78

## 2019-07-03 DIAGNOSIS — I69398 Other sequelae of cerebral infarction: Secondary | ICD-10-CM | POA: Diagnosis not present

## 2019-07-03 DIAGNOSIS — I69320 Aphasia following cerebral infarction: Secondary | ICD-10-CM | POA: Diagnosis not present

## 2019-07-03 DIAGNOSIS — G894 Chronic pain syndrome: Secondary | ICD-10-CM | POA: Diagnosis not present

## 2019-07-03 DIAGNOSIS — M5136 Other intervertebral disc degeneration, lumbar region: Secondary | ICD-10-CM | POA: Diagnosis not present

## 2019-07-03 DIAGNOSIS — S0990XA Unspecified injury of head, initial encounter: Secondary | ICD-10-CM

## 2019-07-03 DIAGNOSIS — W19XXXA Unspecified fall, initial encounter: Secondary | ICD-10-CM | POA: Diagnosis not present

## 2019-07-03 DIAGNOSIS — J8410 Pulmonary fibrosis, unspecified: Secondary | ICD-10-CM | POA: Diagnosis not present

## 2019-07-03 DIAGNOSIS — M6281 Muscle weakness (generalized): Secondary | ICD-10-CM | POA: Diagnosis not present

## 2019-07-03 LAB — CUP PACEART REMOTE DEVICE CHECK
Date Time Interrogation Session: 20200728004020
Implantable Pulse Generator Implant Date: 20180918

## 2019-07-03 NOTE — Telephone Encounter (Signed)
Pt scheduled with Lauren at 4 for telephone call. Patient confirmed doing ok and refused going to UC or ED. Was just seen last week. Patient denied having any acute symptoms at this time. BP a few hours ago when nurse came out was 134/78.

## 2019-07-03 NOTE — Telephone Encounter (Signed)
Agree with need for evaluation today given fall and head injury.   Please notify pt.

## 2019-07-03 NOTE — Telephone Encounter (Signed)
Copied from [place] [phone]. Topic: General - Other >> Jul 03, 2019 11:43 AM Jodie Echevaria wrote: Reason for CRM: Suezanne Jacquet PT with [facility] called to say that patient fell on 07/01/2019 after attempting to sit down and missed the chair. Patient did not receive medical attention because there was no pain or broken bones. If there are any questions please call Ben at Ph# [phone] or patient at Ph# [phone]

## 2019-07-03 NOTE — Progress Notes (Signed)
Patient ID: Melody Ochoa, female   DOB: 07-08-1941, 78 y.o.   MRN: [id number]    Virtual Visit via phone Note  This visit type was conducted due to national recommendations for restrictions regarding the COVID-19 pandemic (e.g. social distancing).  This format is felt to be most appropriate for this patient at this time.  All issues noted in this document were discussed and addressed.  No physical exam was performed (except for noted visual exam findings with Video Visits).   I connected with Melody Ochoa today at  4:00 PM EDT by telephone and verified that I am speaking with the correct person using two identifiers. Location patient: home Location provider: work or home office Persons participating in the virtual visit: patient, provider  I discussed the limitations, risks, security and privacy concerns of performing an evaluation and management service by telephone and the availability of in person appointments. I also discussed with the patient that there may be a patient responsible charge related to this service. The patient expressed understanding and agreed to proceed.   HPI:  Patient and I connected via telephone due to fall.  Patient states earlier today she missed her chair and fell on her bottom onto the floor and upon waking back did hit her head on the floor as well.  States she went to sit in her chair but is always in the same place but somewhat had moved and by the time she realized the chair was moved she had already started to sit.  Denies losing consciousness.  Denies loss of visual field or blurry vision.  Denies facial weakness or extremity weakness.  Denies speech difficulty. Denies nausea or vomiting.   Does report a mild sore spot on back of head where her head hit the floor, but otherwise no sharp shooting head pain.  PT came to her home today and they reported fall to [place] per their protocol. She is currently getting home care PT/OT due to CVA in early July.    Patient initially was advised to go to UC for evaluation, but refused.    ROS: See pertinent positives and negatives per HPI.  Past Medical History:  Diagnosis Date  . Arthritis   . Atheromatous plaque 10/18/2017  . Back pain   . Cancer ([place])    skin  . Depression   . Diverticulosis   . Dizziness    patient had episode of dizziness when came in room. hx past no dx  . GERD (gastroesophageal reflux disease)   . Heart murmur   . Hyperlipidemia 10/18/2017  . PONV (postoperative nausea and vomiting)    yrs ago  . Stroke [facility])     Past Surgical History:  Procedure Laterality Date  . BACK SURGERY  13  . BREAST BIOPSY Bilateral    cores "years ago"  . BREAST EXCISIONAL BIOPSY Left 90's  . CATARACT EXTRACTION W/PHACO Left 12/20/2018   Procedure: CATARACT EXTRACTION PHACO AND INTRAOCULAR LENS PLACEMENT ([place]) LEFT TOPICAL;  Surgeon: Leandrew Koyanagi, MD;  Location: [place];  Service: Ophthalmology;  Laterality: Left;  . CATARACT EXTRACTION W/PHACO Right 01/24/2019   Procedure: CATARACT EXTRACTION PHACO AND INTRAOCULAR LENS PLACEMENT ([place]) RIGHT;  Surgeon: Leandrew Koyanagi, MD;  Location: [place];  Service: Ophthalmology;  Laterality: Right;  . CERVICAL DISC SURGERY  09  . COLONOSCOPY WITH PROPOFOL N/A 01/13/2016   Procedure: COLONOSCOPY WITH PROPOFOL;  Surgeon: [name], MD;  Location: [facility] ENDOSCOPY;  Service: Endoscopy;  Laterality: N/A;  . FACELIFT  94  . LOOP RECORDER INSERTION N/A 08/23/2017   Procedure: LOOP RECORDER INSERTION;  Surgeon: Thompson Grayer, MD;  Location: [place] CV LAB;  Service: Cardiovascular;  Laterality: N/A;  . TEE WITHOUT CARDIOVERSION N/A 08/12/2017   Procedure: TRANSESOPHAGEAL ECHOCARDIOGRAM (TEE);  Surgeon: Sueanne Margarita, MD;  Location: [facility] ENDOSCOPY;  Service: Cardiovascular;  Laterality: N/A;  . TONSILLECTOMY    . TOTAL HIP ARTHROPLASTY Right 04/22/2016   Procedure: TOTAL HIP ARTHROPLASTY;  Surgeon: Corky Mull, MD;   Location: [facility] ORS;  Service: Orthopedics;  Laterality: Right;    Family History  Problem Relation Age of Onset  . Lung cancer Father   . Aneurysm Brother   . Stroke Brother   . Diabetes Maternal Grandmother   . Kidney disease Maternal Grandfather   . Cushing syndrome Paternal Grandmother   . Dementia Paternal Grandfather   . Breast cancer Maternal Aunt 52   Social History   Tobacco Use  . Smoking status: Former Smoker    Packs/day: 1.00    Years: 40.00    Pack years: 40.00    Types: Cigarettes    Quit date: 11/13/1996    Years since quitting: 22.6  . Smokeless tobacco: Never Used  Substance Use Topics  . Alcohol use: Yes    Comment: occ wine    Current Outpatient Medications:  .  albuterol (VENTOLIN HFA) 108 (90 Base) MCG/ACT inhaler, TAKE 2 PUFFS BY MOUTH EVERY 6 HOURS AS NEEDED FOR WHEEZE OR SHORTNESS OF BREATH, Disp: 8.5 g, Rfl: 2 .  amLODipine (NORVASC) 2.5 MG tablet, Take 1 tablet (2.5 mg total) by mouth daily as needed. If blood pressure greater than 140/90., Disp: 90 tablet, Rfl: 3 .  atorvastatin (LIPITOR) 80 MG tablet, TAKE 1 TABLET (80 MG TOTAL) BY MOUTH DAILY AT 6 PM., Disp: 90 tablet, Rfl: 1 .  buPROPion (WELLBUTRIN XL) 300 MG 24 hr tablet, Take 300 mg by mouth daily. , Disp: , Rfl:  .  Calcium Carbonate-Vitamin D (CALTRATE 600+D PO), Take 1 tablet by mouth daily., Disp: , Rfl:  .  clopidogrel (PLAVIX) 75 MG tablet, TAKE 1 TABLET BY MOUTH EVERY DAY, Disp: 30 tablet, Rfl: 11 .  DULoxetine (CYMBALTA) 60 MG capsule, Take 60 mg by mouth 2 (two) times daily. , Disp: , Rfl:  .  famotidine (PEPCID) 20 MG tablet, Take 20 mg by mouth at bedtime. , Disp: , Rfl: 12 .  ferrous sulfate 325 (65 FE) MG tablet, Take 1 tablet (325 mg total) by mouth daily., Disp: 30 tablet, Rfl: 0 .  fluticasone (FLONASE) 50 MCG/ACT nasal spray, Place 1 spray into both nostrils daily as needed for allergies or rhinitis (congestion). , Disp: , Rfl:  .  triamcinolone cream (KENALOG) 0.1 %, Apply 1  application topically 2 (two) times daily as needed (rash/skin irritation.). , Disp: , Rfl: 0 .  pregabalin (LYRICA) 50 MG capsule, Take 1 capsule (50 mg total) by mouth 3 (three) times daily., Disp: 90 capsule, Rfl: 2 .  tiotropium (SPIRIVA) 18 MCG inhalation capsule, Place 1 capsule (18 mcg total) into inhaler and inhale daily for 30 days., Disp: 30 capsule, Rfl: 12  EXAM:  Vitals:   07/03/19 1630  BP: 134/78   GENERAL: alert, oriented, sounds well and in no acute distress  LUNGS: Speaking in full sentences, no signs of respiratory distress, breathing rate appears normal, no obvious gross SOB, gasping or wheezing  PSYCH/NEURO: pleasant and cooperative, no obvious depression or anxiety, speech and thought processing grossly intact.  Correctly tells me her name, birthday, the current month/year, the president's name, the season (summer).   ASSESSMENT AND PLAN:  Discussed the following assessment and plan:  Fall/head injury - patient is alert and oriented, speech is clear and she is able to answer all neuro screening questions correctly & without hesitation. She declines getting any imaging.  Reviewed red flag signs and symptoms that would prompt patient to call the on-call and or go to ER for evaluation including worsening head pain, development of nausea or vomiting, changes in visual, one-sided facial weakness speech difficulty extremity weakness, feeling faint or dizzy.  Patient verbalized understanding of these red flag symptoms.  Her occupational therapist will also be at her home tomorrow for another session.    I discussed the assessment and treatment plan with the patient. The patient was provided an opportunity to ask questions and all were answered. The patient agreed with the plan and demonstrated an understanding of the instructions.   The patient was advised to call back or seek an in-person evaluation if the symptoms worsen or if the condition fails to improve as anticipated.   I provided 15 minutes of non-face-to-face time during this encounter.   Jodelle Green, FNP

## 2019-07-03 NOTE — Telephone Encounter (Signed)
Called and spoke to J.F. Villareal, [place] at [facility].  Suezanne Jacquet said pt fell on 07/01/19 at home.  Suezanne Jacquet said pt's husband was at home w/ pt at time of fall.  Suezanne Jacquet said that he told pt that he would need to report the fall to doctor's office.   Called and spoke to pt.  Pt said that she missed a chair while trying to sit down and fell on the floor. Pt said that she hit her head on the floor.    Pt denied having any blurred vision, confusion, nausea.  Denied having any knots or lacerations on head.  Pt said that she was able to walk and move around ok.  Pt said that she didn't want Ben to report fall but he did.  Advised pt to go to UC to be evaluated since she had a fall and hit her head to make sure everything is ok.    Pt said that she doesn't think she needs to do anything at this time.  Pt said that she didn't want to go to UC but would go if she started having any symptoms.  Will forward to covering provider for review and PCP as FYI.

## 2019-07-04 DIAGNOSIS — M6281 Muscle weakness (generalized): Secondary | ICD-10-CM | POA: Diagnosis not present

## 2019-07-04 DIAGNOSIS — M5136 Other intervertebral disc degeneration, lumbar region: Secondary | ICD-10-CM | POA: Diagnosis not present

## 2019-07-04 DIAGNOSIS — I2510 Atherosclerotic heart disease of native coronary artery without angina pectoris: Secondary | ICD-10-CM | POA: Diagnosis not present

## 2019-07-04 DIAGNOSIS — R52 Pain, unspecified: Secondary | ICD-10-CM

## 2019-07-04 DIAGNOSIS — I69398 Other sequelae of cerebral infarction: Secondary | ICD-10-CM | POA: Diagnosis not present

## 2019-07-04 DIAGNOSIS — F419 Anxiety disorder, unspecified: Secondary | ICD-10-CM

## 2019-07-04 DIAGNOSIS — R2689 Other abnormalities of gait and mobility: Secondary | ICD-10-CM

## 2019-07-04 DIAGNOSIS — G894 Chronic pain syndrome: Secondary | ICD-10-CM | POA: Diagnosis not present

## 2019-07-04 DIAGNOSIS — I69320 Aphasia following cerebral infarction: Secondary | ICD-10-CM | POA: Diagnosis not present

## 2019-07-04 DIAGNOSIS — M4727 Other spondylosis with radiculopathy, lumbosacral region: Secondary | ICD-10-CM | POA: Diagnosis not present

## 2019-07-04 DIAGNOSIS — I5022 Chronic systolic (congestive) heart failure: Secondary | ICD-10-CM | POA: Diagnosis not present

## 2019-07-04 DIAGNOSIS — I110 Hypertensive heart disease with heart failure: Secondary | ICD-10-CM | POA: Diagnosis not present

## 2019-07-04 DIAGNOSIS — J449 Chronic obstructive pulmonary disease, unspecified: Secondary | ICD-10-CM | POA: Diagnosis not present

## 2019-07-04 DIAGNOSIS — F329 Major depressive disorder, single episode, unspecified: Secondary | ICD-10-CM | POA: Diagnosis not present

## 2019-07-04 DIAGNOSIS — J8410 Pulmonary fibrosis, unspecified: Secondary | ICD-10-CM | POA: Diagnosis not present

## 2019-07-04 NOTE — Telephone Encounter (Signed)
Seen by lauren yesterday virtually

## 2019-07-05 DIAGNOSIS — G894 Chronic pain syndrome: Secondary | ICD-10-CM | POA: Diagnosis not present

## 2019-07-05 DIAGNOSIS — J8410 Pulmonary fibrosis, unspecified: Secondary | ICD-10-CM | POA: Diagnosis not present

## 2019-07-05 DIAGNOSIS — I69320 Aphasia following cerebral infarction: Secondary | ICD-10-CM | POA: Diagnosis not present

## 2019-07-05 DIAGNOSIS — I69398 Other sequelae of cerebral infarction: Secondary | ICD-10-CM | POA: Diagnosis not present

## 2019-07-05 DIAGNOSIS — M5136 Other intervertebral disc degeneration, lumbar region: Secondary | ICD-10-CM | POA: Diagnosis not present

## 2019-07-05 DIAGNOSIS — M6281 Muscle weakness (generalized): Secondary | ICD-10-CM | POA: Diagnosis not present

## 2019-07-06 DIAGNOSIS — G894 Chronic pain syndrome: Secondary | ICD-10-CM | POA: Diagnosis not present

## 2019-07-06 DIAGNOSIS — M5136 Other intervertebral disc degeneration, lumbar region: Secondary | ICD-10-CM | POA: Diagnosis not present

## 2019-07-06 DIAGNOSIS — M6281 Muscle weakness (generalized): Secondary | ICD-10-CM | POA: Diagnosis not present

## 2019-07-06 DIAGNOSIS — J8410 Pulmonary fibrosis, unspecified: Secondary | ICD-10-CM | POA: Diagnosis not present

## 2019-07-06 DIAGNOSIS — I69320 Aphasia following cerebral infarction: Secondary | ICD-10-CM | POA: Diagnosis not present

## 2019-07-06 DIAGNOSIS — I69398 Other sequelae of cerebral infarction: Secondary | ICD-10-CM | POA: Diagnosis not present

## 2019-07-09 DIAGNOSIS — J8410 Pulmonary fibrosis, unspecified: Secondary | ICD-10-CM | POA: Diagnosis not present

## 2019-07-09 DIAGNOSIS — G894 Chronic pain syndrome: Secondary | ICD-10-CM | POA: Diagnosis not present

## 2019-07-09 DIAGNOSIS — I69398 Other sequelae of cerebral infarction: Secondary | ICD-10-CM | POA: Diagnosis not present

## 2019-07-09 DIAGNOSIS — I69320 Aphasia following cerebral infarction: Secondary | ICD-10-CM | POA: Diagnosis not present

## 2019-07-09 DIAGNOSIS — M6281 Muscle weakness (generalized): Secondary | ICD-10-CM | POA: Diagnosis not present

## 2019-07-09 DIAGNOSIS — M5136 Other intervertebral disc degeneration, lumbar region: Secondary | ICD-10-CM | POA: Diagnosis not present

## 2019-07-10 DIAGNOSIS — I69398 Other sequelae of cerebral infarction: Secondary | ICD-10-CM | POA: Diagnosis not present

## 2019-07-10 DIAGNOSIS — M5136 Other intervertebral disc degeneration, lumbar region: Secondary | ICD-10-CM | POA: Diagnosis not present

## 2019-07-10 DIAGNOSIS — J8410 Pulmonary fibrosis, unspecified: Secondary | ICD-10-CM | POA: Diagnosis not present

## 2019-07-10 DIAGNOSIS — I69320 Aphasia following cerebral infarction: Secondary | ICD-10-CM | POA: Diagnosis not present

## 2019-07-10 DIAGNOSIS — G894 Chronic pain syndrome: Secondary | ICD-10-CM | POA: Diagnosis not present

## 2019-07-10 DIAGNOSIS — M6281 Muscle weakness (generalized): Secondary | ICD-10-CM | POA: Diagnosis not present

## 2019-07-12 DIAGNOSIS — J8410 Pulmonary fibrosis, unspecified: Secondary | ICD-10-CM | POA: Diagnosis not present

## 2019-07-12 DIAGNOSIS — G894 Chronic pain syndrome: Secondary | ICD-10-CM | POA: Diagnosis not present

## 2019-07-12 DIAGNOSIS — I69320 Aphasia following cerebral infarction: Secondary | ICD-10-CM | POA: Diagnosis not present

## 2019-07-12 DIAGNOSIS — M6281 Muscle weakness (generalized): Secondary | ICD-10-CM | POA: Diagnosis not present

## 2019-07-12 DIAGNOSIS — M5136 Other intervertebral disc degeneration, lumbar region: Secondary | ICD-10-CM | POA: Diagnosis not present

## 2019-07-12 DIAGNOSIS — I69398 Other sequelae of cerebral infarction: Secondary | ICD-10-CM | POA: Diagnosis not present

## 2019-07-17 DIAGNOSIS — G894 Chronic pain syndrome: Secondary | ICD-10-CM | POA: Diagnosis not present

## 2019-07-17 DIAGNOSIS — I69320 Aphasia following cerebral infarction: Secondary | ICD-10-CM | POA: Diagnosis not present

## 2019-07-17 DIAGNOSIS — I69398 Other sequelae of cerebral infarction: Secondary | ICD-10-CM | POA: Diagnosis not present

## 2019-07-17 DIAGNOSIS — M5136 Other intervertebral disc degeneration, lumbar region: Secondary | ICD-10-CM | POA: Diagnosis not present

## 2019-07-17 DIAGNOSIS — J8410 Pulmonary fibrosis, unspecified: Secondary | ICD-10-CM | POA: Diagnosis not present

## 2019-07-17 DIAGNOSIS — M6281 Muscle weakness (generalized): Secondary | ICD-10-CM | POA: Diagnosis not present

## 2019-07-18 NOTE — Progress Notes (Signed)
Carelink Summary Report / Loop Recorder 

## 2019-07-21 DIAGNOSIS — J449 Chronic obstructive pulmonary disease, unspecified: Secondary | ICD-10-CM | POA: Diagnosis not present

## 2019-07-21 DIAGNOSIS — I110 Hypertensive heart disease with heart failure: Secondary | ICD-10-CM | POA: Diagnosis not present

## 2019-07-21 DIAGNOSIS — F419 Anxiety disorder, unspecified: Secondary | ICD-10-CM | POA: Diagnosis not present

## 2019-07-21 DIAGNOSIS — R2689 Other abnormalities of gait and mobility: Secondary | ICD-10-CM | POA: Diagnosis not present

## 2019-07-21 DIAGNOSIS — M6281 Muscle weakness (generalized): Secondary | ICD-10-CM | POA: Diagnosis not present

## 2019-07-21 DIAGNOSIS — M5136 Other intervertebral disc degeneration, lumbar region: Secondary | ICD-10-CM | POA: Diagnosis not present

## 2019-07-21 DIAGNOSIS — I5022 Chronic systolic (congestive) heart failure: Secondary | ICD-10-CM | POA: Diagnosis not present

## 2019-07-21 DIAGNOSIS — F329 Major depressive disorder, single episode, unspecified: Secondary | ICD-10-CM | POA: Diagnosis not present

## 2019-07-21 DIAGNOSIS — M4727 Other spondylosis with radiculopathy, lumbosacral region: Secondary | ICD-10-CM | POA: Diagnosis not present

## 2019-07-21 DIAGNOSIS — G894 Chronic pain syndrome: Secondary | ICD-10-CM | POA: Diagnosis not present

## 2019-07-21 DIAGNOSIS — I69320 Aphasia following cerebral infarction: Secondary | ICD-10-CM | POA: Diagnosis not present

## 2019-07-21 DIAGNOSIS — I2510 Atherosclerotic heart disease of native coronary artery without angina pectoris: Secondary | ICD-10-CM | POA: Diagnosis not present

## 2019-07-21 DIAGNOSIS — I69398 Other sequelae of cerebral infarction: Secondary | ICD-10-CM | POA: Diagnosis not present

## 2019-07-21 DIAGNOSIS — J8410 Pulmonary fibrosis, unspecified: Secondary | ICD-10-CM | POA: Diagnosis not present

## 2019-07-21 DIAGNOSIS — R52 Pain, unspecified: Secondary | ICD-10-CM | POA: Diagnosis not present

## 2019-07-23 ENCOUNTER — Encounter: Payer: Self-pay | Admitting: Student in an Organized Health Care Education/Training Program

## 2019-07-24 ENCOUNTER — Other Ambulatory Visit: Payer: Self-pay

## 2019-07-24 ENCOUNTER — Ambulatory Visit
Payer: Medicare Other | Attending: Student in an Organized Health Care Education/Training Program | Admitting: Student in an Organized Health Care Education/Training Program

## 2019-07-24 ENCOUNTER — Encounter: Payer: Self-pay | Admitting: Student in an Organized Health Care Education/Training Program

## 2019-07-24 DIAGNOSIS — M19012 Primary osteoarthritis, left shoulder: Secondary | ICD-10-CM | POA: Diagnosis not present

## 2019-07-24 DIAGNOSIS — G894 Chronic pain syndrome: Secondary | ICD-10-CM

## 2019-07-24 DIAGNOSIS — Z9889 Other specified postprocedural states: Secondary | ICD-10-CM

## 2019-07-24 DIAGNOSIS — G8929 Other chronic pain: Secondary | ICD-10-CM

## 2019-07-24 DIAGNOSIS — M25512 Pain in left shoulder: Secondary | ICD-10-CM

## 2019-07-24 DIAGNOSIS — M5416 Radiculopathy, lumbar region: Secondary | ICD-10-CM

## 2019-07-24 NOTE — Progress Notes (Signed)
Pain Management Virtual Encounter Note - Virtual Visit via [place] (real-time audio visits between healthcare provider and patient).   Patient's Phone No. & Preferred Pharmacy:  [phone] (home); [phone] (mobile); (Preferred) [phone] [email or web address]  [id number] Melody Ochoa, [facility] [address] [place] [address] Phone: [phone] Fax: [phone]    Pre-screening note:  Our staff contacted Melody Ochoa and offered her an "in person", "face-to-face" appointment versus a telephone encounter. She indicated preferring the telephone encounter, at this time.   Reason for Virtual Visit: COVID-19*  Social distancing based on CDC and AMA recommendations.   I contacted Melody Ochoa on 07/24/2019 via video conference.      I clearly identified myself as Gillis Santa, MD. I verified that I was speaking with the correct person using two identifiers (Name: Melody Ochoa, and date of birth: 1941/06/27).  Advanced Informed Consent I sought verbal advanced consent from Melody Ochoa for virtual visit interactions. I informed Melody Ochoa of possible security and privacy concerns, risks, and limitations associated with providing "not-in-person" medical evaluation and management services. I also informed Melody Ochoa of the availability of "in-person" appointments. Finally, I informed her that there would be a charge for the virtual visit and that she could be  personally, fully or partially, financially responsible for it. Melody Ochoa expressed understanding and agreed to proceed.   Historic Elements   Melody Ochoa is a 78 y.o. year old, female patient evaluated today after her last encounter by our practice on 06/02/2019. Melody Ochoa  has a past medical history of Arthritis, Atheromatous plaque (10/18/2017), Back pain, Cancer ([place]), Depression, Diverticulosis, Dizziness, GERD (gastroesophageal reflux disease), Heart  murmur, Hyperlipidemia (10/18/2017), PONV (postoperative nausea and vomiting), and Stroke ([place]). She also  has a past surgical history that includes Tonsillectomy; Facelift (89); Cervical disc surgery (09); Back surgery (13); Colonoscopy with propofol (N/A, 01/13/2016); Total hip arthroplasty (Right, 04/22/2016); TEE without cardioversion (N/A, 08/12/2017); LOOP RECORDER INSERTION (N/A, 08/23/2017); Breast excisional biopsy (Left, 90's); Breast biopsy (Bilateral); Cataract extraction w/PHACO (Left, 12/20/2018); and Cataract extraction w/PHACO (Right, 01/24/2019). Melody Ochoa has a current medication list which includes the following prescription(s): albuterol, amlodipine, atorvastatin, bupropion, clopidogrel, duloxetine, famotidine, ferrous sulfate, fluticasone, triamcinolone cream, calcium carbonate-vitamin d, pregabalin, and tiotropium. She  reports that she quit smoking about 22 years ago. Her smoking use included cigarettes. She has a 40.00 pack-year smoking history. She has never used smokeless tobacco. She reports current alcohol use. She reports that she does not use drugs. Melody Ochoa is allergic to aleve [naproxen sodium]; aspirin; celebrex [celecoxib]; effexor [venlafaxine]; gabapentin; hydrocodone-homatropine; ibuprofen; tape; vioxx [rofecoxib]; other; and tramadol.   HPI  Today, she is being contacted for new problems.  Patient is status post L2 3 XLIF and L2-5 revision on 04/13/2019 and states that she is doing very well in regards to her low back and leg pain.  She has in fact no pain there and is very happy with her surgical outcome.  She is now endorsing left shoulder pain that started after a fall in June.  Patient's x-ray does show degenerative changes of her acromioclavicular and glenohumeral joint.  We discussed diagnostic left shoulder steroid injection.  Risks and benefits reviewed and patient would like to proceed.   UDS:  Summary  Date Value Ref Range Status  02/15/2018 FINAL  Final     Comment:    ==================================================================== [facility] COMP DRUG ANALYSIS,UR ==================================================================== Test  Result       Flag       Units Drug Present and Declared for Prescription Verification   Gabapentin                     PRESENT      EXPECTED   Bupropion                      PRESENT      EXPECTED   Hydroxybupropion               PRESENT      EXPECTED    Hydroxybupropion is an expected metabolite of bupropion.   Duloxetine                     PRESENT      EXPECTED ==================================================================== Test                      Result    Flag   Units      Ref Range   Creatinine              90               mg/dL      >=20 ==================================================================== Declared Medications:  The flagging and interpretation on this report are based on the  following declared medications.  Unexpected results may arise from  inaccuracies in the declared medications.  **Note: The testing scope of this panel includes these medications:  Bupropion (Wellbutrin)  Duloxetine (Cymbalta)  Gabapentin  **Note: The testing scope of this panel does not include following  reported medications:  Atorvastatin (Lipitor)  Calcium carbonate (Calcium carbonate/Vitamin D)  Clopidogrel (Plavix)  Cyanocobalamin  Iron (Ferrous Sulfate)  Mupirocin (Bactroban)  Triamcinolone (Kenalog)  Vitamin C  Vitamin D (Calcium carbonate/Vitamin D) ==================================================================== For clinical consultation, please call [phone]. ====================================================================    Laboratory Chemistry Profile (12 mo)  Renal: 06/25/2019: BUN 15; Creatinine, Ser 0.92  Lab Results  Component Value Date   GFRAA >60 06/25/2019   GFRNONAA 59 (L) 06/25/2019   Hepatic: 06/25/2019: Albumin 4.2 Lab  Results  Component Value Date   AST 22 06/25/2019   ALT 19 06/25/2019   Other: No results found for requested labs within last 8760 hours. Note: Above Lab results reviewed.  Imaging  Last 90 days:  Ct Head Wo Contrast  Result Date: 06/13/2019 CLINICAL DATA:  Mental status changes since last evening. Dizziness. EXAM: CT HEAD WITHOUT CONTRAST TECHNIQUE: Contiguous axial images were obtained from the base of the skull through the vertex without intravenous contrast. COMPARISON:  04/11/2018 FINDINGS: Brain: Stable age related cerebral atrophy, ventriculomegaly and periventricular white matter disease. No extra-axial fluid collections are identified. No CT findings for acute hemispheric infarction or intracranial hemorrhage. No mass lesions. Remote left occipital infarct. The brainstem and cerebellum are normal. Vascular: Stable vascular calcifications. No definite aneurysm or hyperdense vessels. Skull: No skull fracture or bone lesions. Sinuses/Orbits: The paranasal sinuses and mastoid air cells are clear. The globes are intact. Other: No scalp lesions or hematoma. IMPRESSION: 1. Stable age related cerebral atrophy, ventriculomegaly and periventricular white matter disease. 2. Remote occipital infarct on the left. 3. No acute intracranial findings or mass lesion. Electronically Signed   By: Marijo Sanes M.D.   On: 06/13/2019 10:44   Ct Chest Wo Contrast  Result Date: 05/21/2019 CLINICAL DATA:  Follow-up lung nodule EXAM: CT CHEST WITHOUT CONTRAST TECHNIQUE: Multidetector  CT imaging of the chest was performed following the standard protocol without IV contrast. COMPARISON:  08/21/2018, 08/19/2017 FINDINGS: Cardiovascular: Aortic atherosclerosis. Left coronary artery calcifications. Normal heart size. No pericardial effusion. Mediastinum/Nodes: No enlarged mediastinal, hilar, or axillary lymph nodes. Thyroid gland, trachea, and esophagus demonstrate no significant findings. Lungs/Pleura: There is moderate  pulmonary fibrosis in a pattern without clear apical to basal gradient, featuring irregular peripheral interstitial opacity, traction bronchiectasis, bronchiolectasis, and possible areas of honeycombing, for example in the left upper lobe (series 3, image 37). No significant change in a fissural opacity of the posterior left upper lobe measuring approximately 1.1 x 1.4 cm, which appears to be confluent fibrosis (series 3, image 44). No pleural effusion or pneumothorax. Upper Abdomen: No acute abnormality. Benign, fat containing thickening of the left adrenal gland. Musculoskeletal: No chest wall mass or suspicious bone lesions identified. IMPRESSION: 1. There is moderate pulmonary fibrosis in a pattern without clear apical to basal gradient, featuring irregular peripheral interstitial opacity, traction bronchiectasis, bronchiolectasis, and possible areas of honeycombing, for example in the left upper lobe (series 3, image 37). Findings are consistent with a "probable UIP" pattern of fibrosis by ATS pulmonary fibrosis criteria and have slightly worsened on examinations dating back to 2018. 2. No significant change in a fissural opacity of the posterior left upper lobe measuring approximately 1.1 x 1.4 cm, which appears to be confluent fibrosis (series 3, image 44). Attention on follow-up. 3.  Aortic atherosclerosis and coronary artery disease. Electronically Signed   By: Eddie Candle M.D.   On: 05/21/2019 14:35   Mr Brain Wo Contrast  Result Date: 06/13/2019 CLINICAL DATA:  Acute presentation with speech disturbance. History of recent stroke. EXAM: MRI HEAD WITHOUT CONTRAST TECHNIQUE: Multiplanar, multiecho pulse sequences of the brain and surrounding structures were obtained without intravenous contrast. COMPARISON:  Head CT 06/13/2019 FINDINGS: Brain: Diffusion imaging shows a 2.5 Cm region of acute infarction in the right posterior frontal region, cortical and subcortical. No mass effect or hemorrhage.  Brainstem is normal. There is an old small vessel infarction in the inferior left cerebellum. There are a few old small vessel infarctions in the superior cerebellum. Cerebral hemispheres show an old infarction at the left parieto-occipital junction affecting the cortical and subcortical brain and mild chronic small-vessel ischemic change the white matter on both sides. No mass lesion, hemorrhage, hydrocephalus or extra-axial collection. Vascular: Major vessels at the base of the brain show flow. Skull and upper cervical spine: Negative Sinuses/Orbits: Clear/normal Other: None IMPRESSION: 2.5 cm region of acute infarction in the right posterior frontal cortical and subcortical brain. No mass effect or hemorrhage. Old left parieto-occipital cortical and subcortical infarction. Mild chronic small-vessel ischemic change of the hemispheric white matter. Electronically Signed   By: Nelson Chimes M.D.   On: 06/13/2019 21:52   US Carotid Bilateral (at [facility] Only)  Result Date: 06/13/2019 CLINICAL DATA:  78 year old female with a history of stroke EXAM: BILATERAL CAROTID DUPLEX ULTRASOUND TECHNIQUE: Pearline Cables scale imaging, color Doppler and duplex ultrasound were performed of bilateral carotid and vertebral arteries in the neck. COMPARISON:  None. FINDINGS: Criteria: Quantification of carotid stenosis is based on velocity parameters that correlate the residual internal carotid diameter with NASCET-based stenosis levels, using the diameter of the distal internal carotid lumen as the denominator for stenosis measurement. The following velocity measurements were obtained: RIGHT ICA:  Systolic 96 cm/sec, Diastolic 30 cm/sec CCA:  71 cm/sec SYSTOLIC ICA/CCA RATIO:  1.3 ECA:  75 cm/sec LEFT ICA:  Systolic 680 cm/sec, Diastolic 27 cm/sec CCA:  95 cm/sec SYSTOLIC ICA/CCA RATIO:  1.2 ECA:  89 cm/sec Right Brachial SBP: Not acquired Left Brachial SBP: Not acquired RIGHT CAROTID ARTERY: No significant calcifications of the right  common carotid artery. Intermediate waveform maintained. Heterogeneous and partially calcified plaque at the right carotid bifurcation. No significant lumen shadowing. Low resistance waveform of the right ICA. No significant tortuosity. RIGHT VERTEBRAL ARTERY: Antegrade flow with low resistance waveform. LEFT CAROTID ARTERY: No significant calcifications of the left common carotid artery. Intermediate waveform maintained. Heterogeneous and partially calcified plaque at the left carotid bifurcation without significant lumen shadowing. Low resistance waveform of the left ICA. No significant tortuosity. LEFT VERTEBRAL ARTERY:  Antegrade flow with low resistance waveform. IMPRESSION: Color duplex indicates minimal heterogeneous and calcified plaque, with no hemodynamically significant stenosis by duplex criteria in the extracranial cerebrovascular circulation. Signed, Dulcy Fanny. Dellia Nims, RPVI [facility] Electronically Signed   By: Corrie Mckusick D.O.   On: 06/13/2019 17:00   Dg Humerus Left  Result Date: 05/08/2019 CLINICAL DATA:  Fall 6 weeks ago.  Acute pain. EXAM: LEFT HUMERUS - 2+ VIEW COMPARISON:  No prior. FINDINGS: Acromioclavicular and glenohumeral degenerative change noted about the left shoulder. Mild degenerative change noted about the left elbow. No acute fracture or dislocation noted. Well-circumscribed sclerotic focus noted in the proximal left humeral diaphysis. This is most likely secondary to a benign process such as a old healed fracture. If symptoms persist bone scan can be obtained to further evaluate to exclude a blastic lesion such as a metastatic focus. IMPRESSION: 1. Acromioclavicular and glenohumeral degenerative change. Degenerative changes about the left elbow. No acute bony or joint abnormality identified. No evidence of fracture or dislocation. 2. Well-circumscribed small sclerotic focus in the proximal left humeral diaphysis.  This is most likely a benign process such as a old healed fracture. If symptoms persist bone scan can be obtained to further evaluate to exclude an active blastic process such as a metastatic focus. Electronically Signed   By: Marcello Moores  Register   On: 05/08/2019 05:40   Mm 3d Screen Breast Bilateral  Result Date: 05/15/2019 CLINICAL DATA:  Screening. EXAM: DIGITAL SCREENING BILATERAL MAMMOGRAM WITH TOMO AND CAD COMPARISON:  Previous exam(s). ACR Breast Density Category c: The breast tissue is heterogeneously dense, which may obscure small masses. FINDINGS: There are no findings suspicious for malignancy. Images were processed with CAD. IMPRESSION: No mammographic evidence of malignancy. A result letter of this screening mammogram will be mailed directly to the patient. RECOMMENDATION: Screening mammogram in one year. (Code:[id number]-[id number]) BI-RADS CATEGORY  1: Negative. Electronically Signed   By: Marin Olp M.D.   On: 05/15/2019 12:32    Assessment  The primary encounter diagnosis was Primary osteoarthritis of left shoulder. Diagnoses of Chronic left shoulder pain, History of lumbar surgery, Lumbar radiculopathy, and Chronic pain syndrome were also pertinent to this visit.  1. Primary osteoarthritis of left shoulder - SHOULDER INJECTION; Future -also consider Left suprascapular nerve block  2. Chronic left shoulder pain - SHOULDER INJECTION; Future  3. History of lumbar surgery status post L2 3 XLIF and L2-5 revision on 04/13/2019 and states that she is doing very well in regards to her low back and leg pain.  She has in fact no pain there and is very happy with her surgical outcome.   4. Lumbar radiculopathy -status post L2 3 XLIF and L2-5 revision on 04/13/2019 and states that she is doing very  well in regards to her low back and leg pain.  She has in fact no pain there and is very happy with her surgical outcome.   5. Chronic pain syndrome -low back and leg pain significantly improved after revision  surgery above with Dr. Cari Caraway, now endorsing left shoulder pain due to left shoulder osteoarthritis.   Plan of Care  I am having Melody Ochoa maintain her DULoxetine, Calcium Carbonate-Vitamin D (CALTRATE 600+D PO), buPROPion, ferrous sulfate, triamcinolone cream, famotidine, tiotropium, fluticasone, clopidogrel, pregabalin, amLODipine, albuterol, and atorvastatin.  Orders:  Orders Placed This Encounter  Procedures  . SHOULDER INJECTION    Standing Status:   Future    Standing Expiration Date:   08/23/2019    Scheduling Instructions:     Side: LEFT     Sedation: Without Sedation.     Timeframe: As soon as schedule allows- PATIENT MUST STOP PLAVIX 7 days prior    Order Specific Question:   Where will this procedure be performed?    Answer:   [facility]   Follow-up plan:   Return in about 2 weeks (around 08/07/2019) for Procedure, left shoulder injection- stop Plavix 7 days prior to procedure (please inform patient) .    Recent Visits No visits were found meeting these conditions.  Showing recent visits within past 90 days and meeting all other requirements   Today's Visits Date Type Provider Dept  07/24/19 Office Visit Gillis Santa, MD [facility]  Showing today's visits and meeting all other requirements   Future Appointments No visits were found meeting these conditions.  Showing future appointments within next 90 days and meeting all other requirements   I discussed the assessment and treatment plan with the patient. The patient was provided an opportunity to ask questions and all were answered. The patient agreed with the plan and demonstrated an understanding of the instructions.  Patient advised to call back or seek an in-person evaluation if the symptoms or condition worsens.  Total duration of non-face-to-face encounter: 5minutes.  Note by: Gillis Santa, MD Date: 07/24/2019; Time: 10:53 AM  Note: This dictation was prepared with Dragon  dictation. Any transcriptional errors that may result from this process are unintentional.  Disclaimer:  * Given the special circumstances of the COVID-19 pandemic, the federal government has announced that the Office for Civil Rights (OCR) will exercise its enforcement discretion and will not impose penalties on physicians using telehealth in the event of noncompliance with regulatory requirements under the [place] (HIPAA) in connection with the good faith provision of telehealth during the UDJSH-70 national public health emergency. ([place])

## 2019-07-30 ENCOUNTER — Other Ambulatory Visit: Payer: Medicare Other

## 2019-07-30 DIAGNOSIS — M6281 Muscle weakness (generalized): Secondary | ICD-10-CM | POA: Diagnosis not present

## 2019-07-30 DIAGNOSIS — G894 Chronic pain syndrome: Secondary | ICD-10-CM | POA: Diagnosis not present

## 2019-07-30 DIAGNOSIS — I69398 Other sequelae of cerebral infarction: Secondary | ICD-10-CM | POA: Diagnosis not present

## 2019-07-30 DIAGNOSIS — I69320 Aphasia following cerebral infarction: Secondary | ICD-10-CM | POA: Diagnosis not present

## 2019-07-30 DIAGNOSIS — M5136 Other intervertebral disc degeneration, lumbar region: Secondary | ICD-10-CM | POA: Diagnosis not present

## 2019-07-30 DIAGNOSIS — J8410 Pulmonary fibrosis, unspecified: Secondary | ICD-10-CM | POA: Diagnosis not present

## 2019-07-31 ENCOUNTER — Encounter: Payer: Self-pay | Admitting: Family

## 2019-08-05 LAB — CUP PACEART REMOTE DEVICE CHECK
Date Time Interrogation Session: 20200830014154
Implantable Pulse Generator Implant Date: 20180918

## 2019-08-06 ENCOUNTER — Ambulatory Visit (INDEPENDENT_AMBULATORY_CARE_PROVIDER_SITE_OTHER): Payer: Medicare Other | Admitting: *Deleted

## 2019-08-06 DIAGNOSIS — I63219 Cerebral infarction due to unspecified occlusion or stenosis of unspecified vertebral arteries: Secondary | ICD-10-CM | POA: Diagnosis not present

## 2019-08-08 ENCOUNTER — Other Ambulatory Visit: Payer: Self-pay

## 2019-08-08 ENCOUNTER — Ambulatory Visit
Admission: RE | Admit: 2019-08-08 | Discharge: 2019-08-08 | Disposition: A | Discharge status: Home / Self Care | Payer: Medicare Other | Source: Ambulatory Visit | Attending: Student in an Organized Health Care Education/Training Program | Admitting: Student in an Organized Health Care Education/Training Program

## 2019-08-08 ENCOUNTER — Encounter: Payer: Self-pay | Admitting: Student in an Organized Health Care Education/Training Program

## 2019-08-08 ENCOUNTER — Ambulatory Visit (HOSPITAL_BASED_OUTPATIENT_CLINIC_OR_DEPARTMENT_OTHER): Payer: Medicare Other | Admitting: Student in an Organized Health Care Education/Training Program

## 2019-08-08 VITALS — BP 160/94 | HR 94 | Temp 97.5°F | Resp 13 | Ht 64.0 in | Wt 185.0 lb

## 2019-08-08 DIAGNOSIS — G8929 Other chronic pain: Secondary | ICD-10-CM | POA: Diagnosis not present

## 2019-08-08 DIAGNOSIS — M25512 Pain in left shoulder: Secondary | ICD-10-CM | POA: Insufficient documentation

## 2019-08-08 DIAGNOSIS — M19012 Primary osteoarthritis, left shoulder: Secondary | ICD-10-CM

## 2019-08-08 MED ORDER — ROPIVACAINE HCL 2 MG/ML IJ SOLN
1.0000 mL | Freq: Once | INTRAMUSCULAR | Status: AC
  Administered 2019-08-08: 1 mL via EPIDURAL
  Filled 2019-08-08: qty 10

## 2019-08-08 MED ORDER — IOHEXOL 180 MG/ML  SOLN
10.0000 mL | Freq: Once | INTRAMUSCULAR | Status: AC
  Administered 2019-08-08: 10 mL via EPIDURAL

## 2019-08-08 MED ORDER — DEXAMETHASONE SODIUM PHOSPHATE 10 MG/ML IJ SOLN
10.0000 mg | Freq: Once | INTRAMUSCULAR | Status: AC
  Administered 2019-08-08: 10 mg
  Filled 2019-08-08: qty 1

## 2019-08-08 MED ORDER — LIDOCAINE HCL 2 % IJ SOLN
20.0000 mL | Freq: Once | INTRAMUSCULAR | Status: AC
  Administered 2019-08-08: 400 mg
  Filled 2019-08-08: qty 40

## 2019-08-08 NOTE — Progress Notes (Signed)
Patient's Name: Melody Ochoa  MRN: [id number]  Referring Provider: Burnard Hawthorne, FNP  DOB: [birth year]  PCP: Burnard Hawthorne, FNP  DOS: 08/08/2019  Note by: Gillis Santa, MD  Service setting: Ambulatory outpatient  Specialty: Interventional Pain Management  Patient type: Established  Location: [facility] (AMB) Pain Management Facility  Visit type: Interventional Procedure   Primary Reason for Visit: Interventional Pain Management Treatment. CC: Back Pain (Low back radiates down left hip) and Chest Pain (Bilateral Rib Pain from Fall)  Procedure:          Anesthesia, Analgesia, Anxiolysis:  Type: Diagnostic Glenohumeral Joint (shoulder) Injection #1  Primary Purpose: Diagnostic Region: Superior Shoulder Area Level:  Shoulder Target Area: Glenohumeral Joint (shoulder) Approach: Posterior approach. Laterality: Left-Sided  Type: Local Anesthesia  Local Anesthetic: Lidocaine 1-2%  Position: Supine   Indications: 1. Primary osteoarthritis of left shoulder   2. Chronic left shoulder pain    Pain Score: Pre-procedure: 6 /10 Post-procedure: 0-No pain/10   Patient stopped Plavix 7 days prior to procedure.   Pre-op Assessment:  Melody Ochoa is a 78 y.o. (year old), female patient, seen today for interventional treatment. She  has a past surgical history that includes Tonsillectomy; Facelift (94); Cervical disc surgery (09); Back surgery (13); Colonoscopy with propofol (N/A, 01/13/2016); Total hip arthroplasty (Right, 04/22/2016); TEE without cardioversion (N/A, 08/12/2017); LOOP RECORDER INSERTION (N/A, 08/23/2017); Breast excisional biopsy (Left, 90's); Breast biopsy (Bilateral); Cataract extraction w/PHACO (Left, 12/20/2018); and Cataract extraction w/PHACO (Right, 01/24/2019). Melody Ochoa has a current medication list which includes the following prescription(s): albuterol, amlodipine, atorvastatin, bupropion, calcium carbonate-vitamin d, clopidogrel, duloxetine, famotidine, ferrous sulfate,  fluticasone, triamcinolone cream, pregabalin, and tiotropium. Her primarily concern today is the Back Pain (Low back radiates down left hip) and Chest Pain (Bilateral Rib Pain from Fall)  Initial Vital Signs:  Pulse/HCG Rate: 94ECG Heart Rate: 95 Temp: (!) 97.5 F (36.4 C) Resp: 18 BP: (!) 142/74 SpO2: 98 %  BMI: Estimated body mass index is 31.76 kg/m as calculated from the following:   Height as of this encounter: 5\' 4"  (1.626 m).   Weight as of this encounter: 185 lb (83.9 kg).  Risk Assessment: Allergies: Reviewed. She is allergic to aleve [naproxen sodium]; aspirin; celebrex [celecoxib]; effexor [venlafaxine]; gabapentin; hydrocodone-homatropine; ibuprofen; tape; vioxx [rofecoxib]; other; and tramadol.  Allergy Precautions: None required Coagulopathies: Reviewed. None identified.  Blood-thinner therapy: None at this time Active Infection(s): Reviewed. None identified. Melody Ochoa is afebrile  Site Confirmation: Melody Ochoa was asked to confirm the procedure and laterality before marking the site Procedure checklist: Completed Consent: Before the procedure and under the influence of no sedative(s), amnesic(s), or anxiolytics, the patient was informed of the treatment options, risks and possible complications. To fulfill our ethical and legal obligations, as recommended by the American Medical Association's Code of Ethics, I have informed the patient of my clinical impression; the nature and purpose of the treatment or procedure; the risks, benefits, and possible complications of the intervention; the alternatives, including doing nothing; the risk(s) and benefit(s) of the alternative treatment(s) or procedure(s); and the risk(s) and benefit(s) of doing nothing. The patient was provided information about the general risks and possible complications associated with the procedure. These may include, but are not limited to: failure to achieve desired goals, infection, bleeding, organ or  nerve damage, allergic reactions, paralysis, and death. In addition, the patient was informed of those risks and complications associated to the procedure, such as failure to decrease pain; infection; bleeding; organ or  nerve damage with subsequent damage to sensory, motor, and/or autonomic systems, resulting in permanent pain, numbness, and/or weakness of one or several areas of the body; allergic reactions; (i.e.: anaphylactic reaction); and/or death. Furthermore, the patient was informed of those risks and complications associated with the medications. These include, but are not limited to: allergic reactions (i.e.: anaphylactic or anaphylactoid reaction(s)); adrenal axis suppression; blood sugar elevation that in diabetics may result in ketoacidosis or comma; water retention that in patients with history of congestive heart failure may result in shortness of breath, pulmonary edema, and decompensation with resultant heart failure; weight gain; swelling or edema; medication-induced neural toxicity; particulate matter embolism and blood vessel occlusion with resultant organ, and/or nervous system infarction; and/or aseptic necrosis of one or more joints. Finally, the patient was informed that Medicine is not an exact science; therefore, there is also the possibility of unforeseen or unpredictable risks and/or possible complications that may result in a catastrophic outcome. The patient indicated having understood very clearly. We have given the patient no guarantees and we have made no promises. Enough time was given to the patient to ask questions, all of which were answered to the patient's satisfaction. Melody Ochoa has indicated that she wanted to continue with the procedure. Attestation: I, the ordering provider, attest that I have discussed with the patient the benefits, risks, side-effects, alternatives, likelihood of achieving goals, and potential problems during recovery for the procedure that I have  provided informed consent. Date   Time: 08/08/2019 10:43 AM  Pre-Procedure Preparation:  Monitoring: As per clinic protocol. Respiration, ETCO2, SpO2, BP, heart rate and rhythm monitor placed and checked for adequate function Safety Precautions: Patient was assessed for positional comfort and pressure points before starting the procedure. Time-out: I initiated and conducted the "Time-out" before starting the procedure, as per protocol. The patient was asked to participate by confirming the accuracy of the "Time Out" information. Verification of the correct person, site, and procedure were performed and confirmed by me, the nursing staff, and the patient. "Time-out" conducted as per Joint Commission's Universal Protocol (UP.01.01.01). Time: 1145  Description of Procedure:          Area Prepped: Entire shoulder Area Prepping solution: DuraPrep (Iodine Povacrylex [0.7% available iodine] and Isopropyl Alcohol, 74% w/w) Safety Precautions: Aspiration looking for blood return was conducted prior to all injections. At no point did we inject any substances, as a needle was being advanced. No attempts were made at seeking any paresthesias. Safe injection practices and needle disposal techniques used. Medications properly checked for expiration dates. SDV (single dose vial) medications used. Description of the Procedure: Protocol guidelines were followed. The patient was placed in position over the procedure table. The target area was identified and the area prepped in the usual manner. Skin & deeper tissues infiltrated with local anesthetic. Appropriate amount of time allowed to pass for local anesthetics to take effect. The procedure needles were then advanced to the target area. Proper needle placement secured. Negative aspiration confirmed. Solution injected in intermittent fashion, asking for systemic symptoms every 0.5cc of injectate. The needles were then removed and the area cleansed, making sure to leave  some of the prepping solution back to take advantage of its long term bactericidal properties.    Vitals:   08/08/19 1142 08/08/19 1148 08/08/19 1151 08/08/19 1154  BP: (!) 128/98 (!) 148/93 (!) 152/99 (!) 160/94  Pulse:      Resp: 14 14 10 13   Temp:      TempSrc:  SpO2: 96% 98% 98% 99%  Weight:      Height:        Start Time: 1145 hrs. End Time: 1152 hrs. Materials:  Needle(s) Type: Spinal Needle Gauge: 22G Length: 3.5-in Medication(s): Please see orders for medications and dosing details. 3 cc solution made of 2 cc of 0.2% ropivacaine, 1 cc of Decadron 10 mg/cc. Imaging Guidance (Non-Spinal):          Type of Imaging Technique: Fluoroscopy Guidance (Non-Spinal) Indication(s): Assistance in needle guidance and placement for procedures requiring needle placement in or near specific anatomical locations not easily accessible without such assistance. Exposure Time: Please see nurses notes. Contrast: Before injecting any contrast, we confirmed that the patient did not have an allergy to iodine, shellfish, or radiological contrast. Once satisfactory needle placement was completed at the desired level, radiological contrast was injected. Contrast injected under live fluoroscopy. No contrast complications. See chart for type and volume of contrast used. Fluoroscopic Guidance: I was personally present during the use of fluoroscopy. "Tunnel Vision Technique" used to obtain the best possible view of the target area. Parallax error corrected before commencing the procedure. "Direction-depth-direction" technique used to introduce the needle under continuous pulsed fluoroscopy. Once target was reached, antero-posterior, oblique, and lateral fluoroscopic projection used confirm needle placement in all planes. Images permanently stored in EMR. Interpretation: I personally interpreted the imaging intraoperatively. Adequate needle placement confirmed in multiple planes. Appropriate spread of  contrast into desired area was observed. No evidence of afferent or efferent intravascular uptake. Permanent images saved into the patient's record.  Antibiotic Prophylaxis:   Anti-infectives (From admission, onward)   None     Indication(s): None identified  Post-operative Assessment:  Post-procedure Vital Signs:  Pulse/HCG Rate: 9482 Temp: (!) 97.5 F (36.4 C) Resp: 13 BP: (!) 160/94 SpO2: 99 %  EBL: None  Complications: No immediate post-treatment complications observed by team, or reported by patient.  Note: The patient tolerated the entire procedure well. A repeat set of vitals were taken after the procedure and the patient was kept under observation following institutional policy, for this type of procedure. Post-procedural neurological assessment was performed, showing return to baseline, prior to discharge. The patient was provided with post-procedure discharge instructions, including a section on how to identify potential problems. Should any problems arise concerning this procedure, the patient was given instructions to immediately contact us, at any time, without hesitation. In any case, we plan to contact the patient by telephone for a follow-up status report regarding this interventional procedure.  Comments:  No additional relevant information. Improve range of motion for the left shoulder with shoulder abduction postprocedure.  Patient instructed to restart Plavix tomorrow. Plan of Care  Orders:  Orders Placed This Encounter  Procedures   DG PAIN [facility] C-ARM 1-60 MIN NO REPORT    Intraoperative interpretation by procedural physician at [facility].    Standing Status:   Standing    Number of Occurrences:   1    Order Specific Question:   Reason for exam:    Answer:   Assistance in needle guidance and placement for procedures requiring needle placement in or near specific anatomical locations not easily accessible without such assistance.   Medications  ordered for procedure: Meds ordered this encounter  Medications   iohexol (OMNIPAQUE) 180 MG/ML injection 10 mL    Must be Myelogram-compatible. If not available, you may substitute with a water-soluble, non-ionic, hypoallergenic, myelogram-compatible radiological contrast medium.   lidocaine (XYLOCAINE) 2 % (with pres)  injection 400 mg   ropivacaine (PF) 2 mg/mL (0.2%) (NAROPIN) injection 1 mL   dexamethasone (DECADRON) injection 10 mg   Medications administered: We administered iohexol, lidocaine, ropivacaine (PF) 2 mg/mL (0.2%), and dexamethasone.  See the medical record for exact dosing, route, and time of administration.  Follow-up plan:   Return in about 4 weeks (around 09/05/2019) for Post Procedure Evaluation.     Status post left shoulder intra-articular injection, posterior approach on 08/08/2019.    Recent Visits Date Type Provider Dept  07/24/19 Office Visit Gillis Santa, MD [facility]  Showing recent visits within past 90 days and meeting all other requirements   Today's Visits Date Type Provider Dept  08/08/19 Procedure visit Gillis Santa, MD [facility]  Showing today's visits and meeting all other requirements   Future Appointments Date Type Provider Dept  09/06/19 Appointment Gillis Santa, MD [facility]  Showing future appointments within next 90 days and meeting all other requirements   Disposition: Discharge home  Discharge Date & Time: 08/08/2019; 1200 hrs.   Primary Care Physician: Burnard Hawthorne, FNP Location: [facility] Note by: Gillis Santa, MD Date: 08/08/2019; Time: 4:06 PM  Disclaimer:  Medicine is not an exact science. The only guarantee in medicine is that nothing is guaranteed. It is important to note that the decision to proceed with this intervention was based on the information collected from the patient. The Data and conclusions were drawn from the patient's questionnaire, the  interview, and the physical examination. Because the information was provided in large part by the patient, it cannot be guaranteed that it has not been purposely or unconsciously manipulated. Every effort has been made to obtain as much relevant data as possible for this evaluation. It is important to note that the conclusions that lead to this procedure are derived in large part from the available data. Always take into account that the treatment will also be dependent on availability of resources and existing treatment guidelines, considered by other Pain Management Practitioners as being common knowledge and practice, at the time of the intervention. For Medico-Legal purposes, it is also important to point out that variation in procedural techniques and pharmacological choices are the acceptable norm. The indications, contraindications, technique, and results of the above procedure should only be interpreted and judged by a Board-Certified Interventional Pain Specialist with extensive familiarity and expertise in the same exact procedure and technique.

## 2019-08-08 NOTE — Patient Instructions (Signed)
Ok to restart Plavix tomorrow.

## 2019-08-09 ENCOUNTER — Telehealth: Payer: Self-pay | Admitting: *Deleted

## 2019-08-09 NOTE — Telephone Encounter (Signed)
Patient reports after procedure on yesterday that she has a headache.  Patient had a left shoulder injection.  I told her that I did not think it would be related but it was possible with all of the medicine and the fact that she was NPO for so long.  Encouraged to take something for the headache and increase her fluid intake today.  Patient verbalizes u/o information.

## 2019-08-10 ENCOUNTER — Ambulatory Visit (INDEPENDENT_AMBULATORY_CARE_PROVIDER_SITE_OTHER): Payer: Medicare Other | Admitting: Family

## 2019-08-10 ENCOUNTER — Ambulatory Visit
Admission: RE | Admit: 2019-08-10 | Discharge: 2019-08-10 | Disposition: A | Discharge status: Home / Self Care | Payer: Medicare Other | Source: Ambulatory Visit | Attending: Family | Admitting: Family

## 2019-08-10 ENCOUNTER — Other Ambulatory Visit: Payer: Self-pay

## 2019-08-10 ENCOUNTER — Encounter: Payer: Self-pay | Admitting: Family

## 2019-08-10 ENCOUNTER — Other Ambulatory Visit: Payer: Self-pay | Admitting: Family

## 2019-08-10 VITALS — BP 122/70 | HR 101 | Temp 97.5°F | Wt 204.0 lb

## 2019-08-10 DIAGNOSIS — G319 Degenerative disease of nervous system, unspecified: Secondary | ICD-10-CM | POA: Diagnosis not present

## 2019-08-10 DIAGNOSIS — R270 Ataxia, unspecified: Secondary | ICD-10-CM | POA: Diagnosis not present

## 2019-08-10 DIAGNOSIS — I63219 Cerebral infarction due to unspecified occlusion or stenosis of unspecified vertebral arteries: Secondary | ICD-10-CM | POA: Diagnosis not present

## 2019-08-10 DIAGNOSIS — Z8673 Personal history of transient ischemic attack (TIA), and cerebral infarction without residual deficits: Secondary | ICD-10-CM | POA: Insufficient documentation

## 2019-08-10 DIAGNOSIS — W19XXXD Unspecified fall, subsequent encounter: Secondary | ICD-10-CM | POA: Diagnosis not present

## 2019-08-10 DIAGNOSIS — I639 Cerebral infarction, unspecified: Secondary | ICD-10-CM

## 2019-08-10 DIAGNOSIS — D649 Anemia, unspecified: Secondary | ICD-10-CM

## 2019-08-10 DIAGNOSIS — S0990XD Unspecified injury of head, subsequent encounter: Secondary | ICD-10-CM | POA: Diagnosis not present

## 2019-08-10 DIAGNOSIS — G6289 Other specified polyneuropathies: Secondary | ICD-10-CM

## 2019-08-10 DIAGNOSIS — M19012 Primary osteoarthritis, left shoulder: Secondary | ICD-10-CM

## 2019-08-10 DIAGNOSIS — S0990XA Unspecified injury of head, initial encounter: Secondary | ICD-10-CM | POA: Diagnosis not present

## 2019-08-10 DIAGNOSIS — G629 Polyneuropathy, unspecified: Secondary | ICD-10-CM | POA: Insufficient documentation

## 2019-08-10 LAB — CBC WITH DIFFERENTIAL/PLATELET
Basophils Absolute: 0.1 10*3/uL (ref 0.0–0.1)
Basophils Relative: 0.9 % (ref 0.0–3.0)
Eosinophils Absolute: 0.3 10*3/uL (ref 0.0–0.7)
Eosinophils Relative: 2.9 % (ref 0.0–5.0)
HCT: 34.1 % — ABNORMAL LOW (ref 36.0–46.0)
Hemoglobin: 11.3 g/dL — ABNORMAL LOW (ref 12.0–15.0)
Lymphocytes Relative: 19.9 % (ref 12.0–46.0)
Lymphs Abs: 1.9 10*3/uL (ref 0.7–4.0)
MCHC: 33.1 g/dL (ref 30.0–36.0)
MCV: 85.7 fl (ref 78.0–100.0)
Monocytes Absolute: 0.9 10*3/uL (ref 0.1–1.0)
Monocytes Relative: 9.9 % (ref 3.0–12.0)
Neutro Abs: 6.3 10*3/uL (ref 1.4–7.7)
Neutrophils Relative %: 66.4 % (ref 43.0–77.0)
Platelets: 285 10*3/uL (ref 150.0–400.0)
RBC: 3.97 Mil/uL (ref 3.87–5.11)
RDW: 15.4 % (ref 11.5–15.5)
WBC: 9.5 10*3/uL (ref 4.0–10.5)

## 2019-08-10 LAB — TSH: TSH: 1.94 u[IU]/mL (ref 0.35–4.50)

## 2019-08-10 LAB — B12 AND FOLATE PANEL
Folate: 9.9 ng/mL (ref >5.9)
Vitamin B-12: 781 pg/mL (ref 211–911)

## 2019-08-10 NOTE — Assessment & Plan Note (Addendum)
Concern with falls and plavix. Referral back to neurology. I have also messaged Dr Leonie Man to ensure he is aware that I have advised patient to return to re-establish care.

## 2019-08-10 NOTE — Assessment & Plan Note (Signed)
Bilateral shoulder pain. Right shoulder pain  ( since fall) improved and patient declines imaging today; she will let me know if doesn't continue to improve.

## 2019-08-10 NOTE — Progress Notes (Signed)
Subjective:    Patient ID: Melody Ochoa, female    DOB: 1941/09/01, 78 y.o.   MRN: [id number]  CC: Melody Ochoa is a 78 y.o. female who presents today for follow up.   HPI: Continues to have falls.  She describes her last fall as  1 week ago in which she fell on her right shoulder.  The shoulder pain has improved as well as the bruising.  unfortunately when she fell, she hit the back of her head on a wooden floor.  no loss of consciousness.  She has had no severe headaches or vision changes.   She describes the reason for her falls as ' my feet get in the way".  She does not feel like falls or balance has worsened since her recent stroke in July of this year.   She feels like she is tripping with her feet such as over a rug. She does have numbness in the distal aspects of her toes.  Her vision is overall improved and she recently bilateral cataract surgery on 2019.    She is using a cane at home however not using her walker as more cumbersome.  She does feel that the walker is more helpful.   low back pain has overall improved and she denies any knee pain.  She has no associated symptoms when she falls or preceding a fall including dizziness, chest pain, shortness of breath, palpitations. She is completed physical therapy at home which she did not think very  helpful.  Completed occupational therapy and her swallowing has improved.  She is not choking.  She does have a mild hoarseness since her last stroke.  She continues to follow-up [facility] and recently had a left shoulder injection which improved her pain greatly.  Currently on Cymbalta, although not sure of dose.  She has been on Lyrica in the past which was helpful.   Loop recorder- She follows Dr. Caryl Comes annually. States that she is currently on iron. She does not follow with neurology. Hypertension-compliant with Norvasc.  No chest pain.    Follows with Dr Holley Raring for  Lumbar radiculopathy, left shoulder pain,  last OV 07/24/19  Seen by [name] virtually for fall 07/03/19  Dr Caryl Comes - follows for loop recorder  HISTORY:  Past Medical History:  Diagnosis Date  . Arthritis   . Atheromatous plaque 10/18/2017  . Back pain   . Cancer ([place])    skin  . Depression   . Diverticulosis   . Dizziness    patient had episode of dizziness when came in room. hx past no dx  . GERD (gastroesophageal reflux disease)   . Heart murmur   . Hyperlipidemia 10/18/2017  . PONV (postoperative nausea and vomiting)    yrs ago  . Stroke [facility])    Past Surgical History:  Procedure Laterality Date  . BACK SURGERY  13  . BREAST BIOPSY Bilateral    cores "years ago"  . BREAST EXCISIONAL BIOPSY Left 90's  . CATARACT EXTRACTION W/PHACO Left 12/20/2018   Procedure: CATARACT EXTRACTION PHACO AND INTRAOCULAR LENS PLACEMENT ([place]) LEFT TOPICAL;  Surgeon: Leandrew Koyanagi, MD;  Location: [place];  Service: Ophthalmology;  Laterality: Left;  . CATARACT EXTRACTION W/PHACO Right 01/24/2019   Procedure: CATARACT EXTRACTION PHACO AND INTRAOCULAR LENS PLACEMENT ([place]) RIGHT;  Surgeon: Leandrew Koyanagi, MD;  Location: [place];  Service: Ophthalmology;  Laterality: Right;  . CERVICAL DISC SURGERY  09  . COLONOSCOPY WITH PROPOFOL N/A 01/13/2016  Procedure: COLONOSCOPY WITH PROPOFOL;  Surgeon: [name], MD;  Location: [facility] ENDOSCOPY;  Service: Endoscopy;  Laterality: N/A;  . FACELIFT  94  . LOOP RECORDER INSERTION N/A 08/23/2017   Procedure: LOOP RECORDER INSERTION;  Surgeon: Thompson Grayer, MD;  Location: [place] [facility];  Service: Cardiovascular;  Laterality: N/A;  . TEE WITHOUT CARDIOVERSION N/A 08/12/2017   Procedure: TRANSESOPHAGEAL ECHOCARDIOGRAM (TEE);  Surgeon: Sueanne Margarita, MD;  Location[facility] ENDOSCOPY;  Service: Cardiovascular;  Laterality: N/A;  . TONSILLECTOMY    . TOTAL HIP ARTHROPLASTY Right 04/22/2016   Procedure: TOTAL HIP ARTHROPLASTY;  Surgeon: Corky Mull, MD;  Location: [facility] ORS;  Service:  Orthopedics;  Laterality: Right;   Family History  Problem Relation Age of Onset  . Lung cancer Father   . Aneurysm Brother   . Stroke Brother   . Diabetes Maternal Grandmother   . Kidney disease Maternal Grandfather   . Cushing syndrome Paternal Grandmother   . Dementia Paternal Grandfather   . Breast cancer Maternal Aunt 52    Allergies: Aleve [naproxen sodium], Aspirin, Celebrex [celecoxib], Effexor [venlafaxine], Gabapentin, Hydrocodone-homatropine, Ibuprofen, Tape, Vioxx [rofecoxib], Other, and Tramadol Current Outpatient Medications on File Prior to Visit  Medication Sig Dispense Refill  . albuterol (VENTOLIN HFA) 108 (90 Base) MCG/ACT inhaler TAKE 2 PUFFS BY MOUTH EVERY 6 HOURS AS NEEDED FOR WHEEZE OR SHORTNESS OF BREATH 8.5 g 2  . amLODipine (NORVASC) 2.5 MG tablet Take 1 tablet (2.5 mg total) by mouth daily as needed. If blood pressure greater than 140/90. 90 tablet 3  . atorvastatin (LIPITOR) 80 MG tablet TAKE 1 TABLET (80 MG TOTAL) BY MOUTH DAILY AT 6 PM. 90 tablet 1  . buPROPion (WELLBUTRIN XL) 300 MG 24 hr tablet Take 300 mg by mouth daily.     . clopidogrel (PLAVIX) 75 MG tablet TAKE 1 TABLET BY MOUTH EVERY DAY 30 tablet 11  . DULoxetine (CYMBALTA) 60 MG capsule Take 60 mg by mouth 2 (two) times daily.     . famotidine (PEPCID) 20 MG tablet Take 20 mg by mouth at bedtime.   12  . ferrous sulfate 325 (65 FE) MG tablet Take 1 tablet (325 mg total) by mouth daily. 30 tablet 0  . fluticasone (FLONASE) 50 MCG/ACT nasal spray Place 1 spray into both nostrils daily as needed for allergies or rhinitis (congestion).     . triamcinolone cream (KENALOG) 0.1 % Apply 1 application topically 2 (two) times daily as needed (rash/skin irritation.).   0  . pregabalin (LYRICA) 50 MG capsule Take 1 capsule (50 mg total) by mouth 3 (three) times daily. 90 capsule 2  . tiotropium (SPIRIVA) 18 MCG inhalation capsule Place 1 capsule (18 mcg total) into inhaler and inhale daily for 30 days. 30  capsule 12   No current facility-administered medications on file prior to visit.     Social History   Tobacco Use  . Smoking status: Former Smoker    Packs/day: 1.00    Years: 40.00    Pack years: 40.00    Types: Cigarettes    Quit date: 11/13/1996    Years since quitting: 22.7  . Smokeless tobacco: Never Used  Substance Use Topics  . Alcohol use: Yes    Comment: occ wine  . Drug use: No    Review of Systems  Constitutional: Negative for chills and fever.  Eyes: Negative for visual disturbance.  Respiratory: Negative for cough, chest tightness and shortness of breath.   Cardiovascular: Negative for chest  pain, palpitations and leg swelling.  Gastrointestinal: Negative for nausea and vomiting.  Musculoskeletal: Positive for arthralgias (BL shoulder) and back pain (chronic).  Neurological: Positive for weakness (BLE) and numbness. Negative for dizziness, syncope and headaches.  Psychiatric/Behavioral: Negative for sleep disturbance and suicidal ideas.      Objective:    BP 122/70   Pulse (!) 101   Temp (!) 97.5 F (36.4 C)   Wt 204 lb (92.5 kg)   SpO2 95%   BMI 35.02 kg/m  BP Readings from Last 3 Encounters:  08/10/19 122/70  08/08/19 (!) 160/94  07/03/19 134/78   Wt Readings from Last 3 Encounters:  08/10/19 204 lb (92.5 kg)  08/08/19 185 lb (83.9 kg)  06/27/19 203 lb 12.8 oz (92.4 kg)    Physical Exam Vitals signs reviewed.  Constitutional:      Appearance: She is well-developed.  HENT:     Mouth/Throat:     Pharynx: Uvula midline.  Eyes:     Conjunctiva/sclera: Conjunctivae normal.     Pupils: Pupils are equal, round, and reactive to light.     Comments: Fundus normal bilaterally.   Cardiovascular:     Rate and Rhythm: Normal rate and regular rhythm.     Pulses: Normal pulses.     Heart sounds: Normal heart sounds.  Pulmonary:     Effort: Pulmonary effort is normal.     Breath sounds: Normal breath sounds. No wheezing, rhonchi or rales.   Musculoskeletal:     Right shoulder: She exhibits decreased range of motion and pain. She exhibits no tenderness, no bony tenderness and no swelling.     Left shoulder: She exhibits decreased range of motion. She exhibits no tenderness, no pain and no spasm.     Right lower leg: No edema.     Left lower leg: No edema.     Comments: Right Shoulder:   No asymmetry of shoulders when comparing right and left.Mild pain with palpation over glenohumeral joint lines. No pain Milo joint, AC joint, or bicipital groove.  Mild decreased lateral raise.   Negative active painful arc sign. Negative passive arc ( Neer's). Negative drop arm.  Strength and sensation normal BUE's.  Left Shoulder:   No asymmetry of shoulders when comparing right and left.Mild pain with palpation over glenohumeral joint lines. No pain [place] joint, AC joint, or bicipital groove.  Profoundly decreased lateral raise.   Negative active painful arc sign. Negative passive arc ( Neer's). Negative drop arm.  Skin:    General: Skin is warm and dry.  Neurological:     Mental Status: She is alert.     Cranial Nerves: No cranial nerve deficit.     Sensory: No sensory deficit.     Deep Tendon Reflexes:     Reflex Scores:      Bicep reflexes are 2+ on the right side and 2+ on the left side.      Patellar reflexes are 2+ on the right side and 2+ on the left side.    Comments: Grip equal and strong bilateral upper extremities. Decreased sensation distally bilateral feet with microfilament test. No wounds, discoloration.   Psychiatric:        Speech: Speech normal.        Behavior: Behavior normal.        Thought Content: Thought content normal.        Assessment & Plan:   Problem List Items Addressed This Visit      Cardiovascular and Mediastinum  Stroke (cerebrum) ([place])    Concern with falls and plavix. Referral back to neurology. I have also messaged Dr Leonie Man to ensure he is aware that I have advised patient to return to  re-establish care.       Relevant Orders   Ambulatory referral to Neurology   Ambulatory referral to Physical Therapy     Nervous and Auditory   Peripheral neuropathy - Primary    Discussed at length recurrent nature of falls.  Reassured by largely normal neurologic exam today . patient has obvious decreased sensation based on microfilament exam.  I do think that neuropathy could heavily be contributing to her falls, proprioception.  Currently she is on Cymbalta.  Once she clarifies that dose, we will discuss whether Lyrica is appropriate adjunct as she has been on the past. However appears she is currently getting from [facility] so I would not prescribe unless I correspond with him.  We will pursue physical therapy, not in home to see if more effective.  Patient will let me know of any recurrent falls. Consult to neurology as well.       Relevant Orders   Ambulatory referral to Neurology   B12 and Folate Panel   CBC with Differential/Platelet   TSH   Ambulatory referral to Physical Therapy     Musculoskeletal and Integument   Primary osteoarthritis of left shoulder    Bilateral shoulder pain. Right shoulder pain  ( since fall) improved and patient declines imaging today; she will let me know if doesn't continue to improve.       Other Visit Diagnoses    Fall, subsequent encounter       Relevant Orders   CT Head Wo Contrast (Completed)   Ambulatory referral to Physical Therapy       I have discontinued Beverley Fiedler Kruszka's Calcium Carbonate-Vitamin D (CALTRATE 600+D PO). I am also having her maintain her DULoxetine, buPROPion, ferrous sulfate, triamcinolone cream, famotidine, tiotropium, fluticasone, clopidogrel, pregabalin, amLODipine, albuterol, and atorvastatin.   No orders of the defined types were placed in this encounter.   Return precautions given.   Risks, benefits, and alternatives of the medications and treatment plan prescribed today were discussed, and patient  expressed understanding.   Education regarding symptom management and diagnosis given to patient on AVS.  Continue to follow with Burnard Hawthorne, FNP for routine health maintenance.   [name] and I agreed with plan.   [name], FNP

## 2019-08-10 NOTE — Assessment & Plan Note (Addendum)
Discussed at length recurrent nature of falls.  Reassured by largely normal neurologic exam today . patient has obvious decreased sensation based on microfilament exam.  I do think that neuropathy could heavily be contributing to her falls, proprioception.  Currently she is on Cymbalta.  Once she clarifies that dose, we will discuss whether Lyrica is appropriate adjunct as she has been on the past. However appears she is currently getting from [facility] so I would not prescribe unless I correspond with him.  We will pursue physical therapy, not in home to see if more effective.  Patient will let me know of any recurrent falls. Consult to neurology as well.

## 2019-08-10 NOTE — Patient Instructions (Addendum)
We need to confirm dose of duloxetine ( Cymbalta). Max dose is 60mg  per day.  I cannot prescribe pregabalin (Lyrica) until we know this  Stat CT Head today   Today we discussed referrals, orders. Neurology ( back to Dr Leonie Man) ,  Physical therapy at [facility]   I have placed these orders in the system for you.  Please be sure to give [place] a call if you have not heard from our office regarding this. We should hear from [place] within ONE week with information regarding your appointment. If not, please let me know immediately.    Labs today  Follow up with Philis Nettle in person one month.

## 2019-08-15 ENCOUNTER — Telehealth: Payer: Self-pay | Admitting: Family

## 2019-08-15 NOTE — Progress Notes (Signed)
Carelink Summary Report / Loop Recorder 

## 2019-08-15 NOTE — Telephone Encounter (Signed)
I spoke with patient & she had not heard as of yet about appointment with neurology. She will call the office by Friday if she has not.

## 2019-08-15 NOTE — Telephone Encounter (Signed)
Call pt I forwarded my note from last week to neurologist Dr Leonie Man and is he is looking forward to seeing  Her in office Has she called to schedule an appt yet? This is very important.

## 2019-08-16 ENCOUNTER — Telehealth: Payer: Self-pay | Admitting: *Deleted

## 2019-08-16 DIAGNOSIS — F325 Major depressive disorder, single episode, in full remission: Secondary | ICD-10-CM | POA: Diagnosis not present

## 2019-08-16 MED ORDER — PREGABALIN 50 MG PO CAPS: 50.0000 mg | ORAL_CAPSULE | Freq: Three times a day (TID) | ORAL | 2 refills | Status: DC

## 2019-08-16 NOTE — Telephone Encounter (Signed)
Requested Prescriptions   Signed Prescriptions Disp Refills  . pregabalin (LYRICA) 50 MG capsule 90 capsule 2    Sig: Take 1 capsule (50 mg total) by mouth 3 (three) times daily.    Authorizing Provider: Gillis Santa

## 2019-08-16 NOTE — Telephone Encounter (Signed)
Called mrs Verno and informed her that Lyrica was escribed to her pharmacy.

## 2019-08-16 NOTE — Telephone Encounter (Signed)
Called patient and she states that her PCP took her off the Cymbalta for Depression. She told him that she was prescribed Lyrica from you and that it is helping her with pain and to sleep. Her PCP stated that he would not prescribe that for her and that she would need you do this. She does not have any left and was wondering if you could do this for her. I would be glad to call her back and inform her. Thanks

## 2019-08-20 ENCOUNTER — Other Ambulatory Visit: Payer: Self-pay

## 2019-08-20 DIAGNOSIS — D649 Anemia, unspecified: Secondary | ICD-10-CM

## 2019-08-29 ENCOUNTER — Ambulatory Visit (INDEPENDENT_AMBULATORY_CARE_PROVIDER_SITE_OTHER): Payer: Medicare Other

## 2019-08-29 ENCOUNTER — Other Ambulatory Visit: Payer: Self-pay

## 2019-08-29 DIAGNOSIS — Z0000 Encounter for general adult medical examination without abnormal findings: Secondary | ICD-10-CM | POA: Diagnosis not present

## 2019-08-29 NOTE — Patient Instructions (Addendum)
  Ms. Cluster , Thank you for taking time to come for your Medicare Wellness Visit. I appreciate your ongoing commitment to your health goals. Please review the following plan we discussed and let me know if I can assist you in the future.   These are the goals we discussed: Goals    . DIET - INCREASE WATER INTAKE     Stay hydrated       This is a list of the screening recommended for you and due dates:  Health Maintenance  Topic Date Due  . DEXA scan (bone density measurement)  05/20/2005  . Flu Shot  08/31/2019*  . Tetanus Vaccine  07/25/2027  . Pneumonia vaccines  Completed  *Topic was postponed. The date shown is not the original due date.

## 2019-08-29 NOTE — Progress Notes (Addendum)
Subjective:   Melody Ochoa is a 78 y.o. female who presents for Medicare Annual (Subsequent) preventive examination.  Review of Systems:  No ROS.  Medicare Wellness Virtual Visit.  Visual/audio telehealth visit, UTA vital signs.   See social history for additional risk factors.   Cardiac Risk Factors include: advanced age (>40men, >51 women)     Objective:     Vitals: There were no vitals taken for this visit.  There is no height or weight on file to calculate BMI.  Advanced Directives 08/29/2019 06/25/2019 06/13/2019 06/13/2019 01/24/2019 12/20/2018 11/14/2018  Does Patient Have a Medical Advance Directive? Yes Yes Yes No Yes Yes Yes  Type of Paramedic of [address];Living will Living will [place];Living will - [place];Living will [place];Living will [place];Living will  Does patient want to make changes to medical advance directive? No - Patient declined - No - Patient declined - - No - Patient declined -  Copy of [place] in Chart? Yes - validated most recent copy scanned in chart (See row information) - No - copy requested - Yes - validated most recent copy scanned in chart (See row information) No - copy requested -  Would patient like information on creating a medical advance directive? - - No - Patient declined No - Patient declined - - -    Tobacco Social History   Tobacco Use  Smoking Status Former Smoker  . Packs/day: 1.00  . Years: 40.00  . Pack years: 40.00  . Types: Cigarettes  . Quit date: 11/13/1996  . Years since quitting: 22.8  Smokeless Tobacco Never Used     Counseling given: Not Answered   Clinical Intake:  Pre-visit preparation completed: Yes        Diabetes: No  How often do you need to have someone help you when you read instructions, pamphlets, or other written materials from your doctor or pharmacy?: 1 - Never   Interpreter Needed?: No     Past Medical History:  Diagnosis Date  . Arthritis   . Atheromatous plaque 10/18/2017  . Back pain   . Cancer ([place])    skin  . Depression   . Diverticulosis   . Dizziness    patient had episode of dizziness when came in room. hx past no dx  . GERD (gastroesophageal reflux disease)   . Heart murmur   . Hyperlipidemia 10/18/2017  . PONV (postoperative nausea and vomiting)    yrs ago  . Stroke [facility])    Past Surgical History:  Procedure Laterality Date  . BACK SURGERY  13  . BREAST BIOPSY Bilateral    cores "years ago"  . BREAST EXCISIONAL BIOPSY Left 90's  . CATARACT EXTRACTION W/PHACO Left 12/20/2018   Procedure: CATARACT EXTRACTION PHACO AND INTRAOCULAR LENS PLACEMENT ([place]) LEFT TOPICAL;  Surgeon: Leandrew Koyanagi, MD;  Location: [place];  Service: Ophthalmology;  Laterality: Left;  . CATARACT EXTRACTION W/PHACO Right 01/24/2019   Procedure: CATARACT EXTRACTION PHACO AND INTRAOCULAR LENS PLACEMENT ([place]) RIGHT;  Surgeon: Leandrew Koyanagi, MD;  Location: [place];  Service: Ophthalmology;  Laterality: Right;  . CERVICAL DISC SURGERY  09  . COLONOSCOPY WITH PROPOFOL N/A 01/13/2016   Procedure: COLONOSCOPY WITH PROPOFOL;  Surgeon: [name], MD;  Location: [facility] ENDOSCOPY;  Service: Endoscopy;  Laterality: N/A;  . FACELIFT  94  . LOOP RECORDER INSERTION N/A 08/23/2017   Procedure: LOOP RECORDER INSERTION;  Surgeon:  Thompson Grayer, MD;  Location: [place] CV LAB;  Service: Cardiovascular;  Laterality: N/A;  . TEE WITHOUT CARDIOVERSION N/A 08/12/2017   Procedure: TRANSESOPHAGEAL ECHOCARDIOGRAM (TEE);  Surgeon: Sueanne Margarita, MD;  Location: [facility] ENDOSCOPY;  Service: Cardiovascular;  Laterality: N/A;  . TONSILLECTOMY    . TOTAL HIP ARTHROPLASTY Right 04/22/2016   Procedure: TOTAL HIP ARTHROPLASTY;  Surgeon: Corky Mull, MD;  Location: [facility] ORS;  Service: Orthopedics;  Laterality: Right;   Family History  Problem Relation Age  of Onset  . Lung cancer Father   . Aneurysm Brother   . Stroke Brother   . Diabetes Maternal Grandmother   . Kidney disease Maternal Grandfather   . Cushing syndrome Paternal Grandmother   . Dementia Paternal Grandfather   . Breast cancer Maternal Aunt 52   Social History   Socioeconomic History  . Marital status: Married    Spouse name: Not on file  . Number of children: Not on file  . Years of education: Not on file  . Highest education level: Not on file  Occupational History  . Not on file  Social Needs  . Financial resource strain: Not hard at all  . Food insecurity    Worry: Never true    Inability: Never true  . Transportation needs    Medical: No    Non-medical: No  Tobacco Use  . Smoking status: Former Smoker    Packs/day: 1.00    Years: 40.00    Pack years: 40.00    Types: Cigarettes    Quit date: 11/13/1996    Years since quitting: 22.8  . Smokeless tobacco: Never Used  Substance and Sexual Activity  . Alcohol use: Yes    Comment: occ wine  . Drug use: No  . Sexual activity: Not Currently  Lifestyle  . Physical activity    Days per week: 0 days    Minutes per session: Not on file  . Stress: Not on file  Relationships  . Social Herbalist on phone: Not on file    Gets together: Not on file    Attends religious service: Not on file    Active member of club or organization: Not on file    Attends meetings of clubs or organizations: Not on file    Relationship status: Not on file  Other Topics Concern  . Not on file  Social History Narrative  . Not on file    Outpatient Encounter Medications as of 08/29/2019  Medication Sig  . albuterol (VENTOLIN HFA) 108 (90 Base) MCG/ACT inhaler TAKE 2 PUFFS BY MOUTH EVERY 6 HOURS AS NEEDED FOR WHEEZE OR SHORTNESS OF BREATH  . amLODipine (NORVASC) 2.5 MG tablet Take 1 tablet (2.5 mg total) by mouth daily as needed. If blood pressure greater than 140/90.  Marland Kitchen atorvastatin (LIPITOR) 80 MG tablet TAKE 1  TABLET (80 MG TOTAL) BY MOUTH DAILY AT 6 PM.  . buPROPion (WELLBUTRIN XL) 300 MG 24 hr tablet Take 300 mg by mouth daily.   . clopidogrel (PLAVIX) 75 MG tablet TAKE 1 TABLET BY MOUTH EVERY DAY  . DULoxetine (CYMBALTA) 60 MG capsule Take 60 mg by mouth 2 (two) times daily.   . famotidine (PEPCID) 20 MG tablet Take 20 mg by mouth at bedtime.   . fluticasone (FLONASE) 50 MCG/ACT nasal spray Place 1 spray into both nostrils daily as needed for allergies or rhinitis (congestion).   . pregabalin (LYRICA) 50 MG capsule Take 1 capsule (50 mg  total) by mouth 3 (three) times daily.  Marland Kitchen tiotropium (SPIRIVA) 18 MCG inhalation capsule Place 1 capsule (18 mcg total) into inhaler and inhale daily for 30 days.  Marland Kitchen triamcinolone cream (KENALOG) 0.1 % Apply 1 application topically 2 (two) times daily as needed (rash/skin irritation.).    No facility-administered encounter medications on file as of 08/29/2019.     Activities of Daily Living In your present state of health, do you have any difficulty performing the following activities: 08/29/2019 06/13/2019  Hearing? N N  Vision? N N  Difficulty concentrating or making decisions? Y N  Walking or climbing stairs? Y Y  Dressing or bathing? N N  Doing errands, shopping? N N  Preparing Food and eating ? N -  Using the Toilet? N -  In the past six months, have you accidently leaked urine? N -  Do you have problems with loss of bowel control? N -  Managing your Medications? N -  Managing your Finances? N -  Housekeeping or managing your Housekeeping? N -  Some recent data might be hidden    Patient Care Team: Burnard Hawthorne, FNP as PCP - General (Family Medicine)    Assessment:   This is a routine wellness examination for Karole.  I connected with patient 08/29/19 at 11:00 AM EDT by an audio enabled telemedicine application and verified that I am speaking with the correct person using two identifiers. Patient stated full name and DOB. Patient gave  permission to continue with virtual visit. Patient's location was at home and Nurse's location was at [place] office.   Health Maintenance Due: -Influenza vaccine 2020- discussed; to be completed in season with doctor or local pharmacy.   -Dexa Scan- she plans to schedule -Update all pending maintenance due as appropriate.   See completed HM at the end of note.   Eye: Visual acuity not assessed. Virtual visit. Wears corrective lenses. Followed by their ophthalmologist every 12 months.   Dental: Visits every 6 months.    Hearing: Demonstrates normal hearing during visit.  Safety:  Patient feels safe at home- yes Patient does have smoke detectors at home- yes Patient does wear sunscreen or protective clothing when in direct sunlight - yes Patient does wear seat belt when in a moving vehicle - yes Adequate lighting in walkways free from debris- yes Grab bars and handrails used as appropriate- yes Ambulates with an assistive device- cane, walker Cell phone on person when ambulating outside of the home- yes  Social: Alcohol intake - yes      Smoking history- former    Smokers in home? none Illicit drug use? none  Depression: PHQ 2 &9 complete. See screening below. Denies irritability, anhedonia, sadness/tearfullness.  Stable.   Falls: See screening below.    Medication: Taking as directed and without issues.   Covid-19: Precautions and sickness symptoms discussed. Wears mask, social distancing, hand hygiene as appropriate.   Activities of Daily Living Patient denies needing assistance with: household chores, feeding themselves, getting from bed to chair, getting to the toilet, bathing/showering, dressing, managing money, or preparing meals.   Memory: Patient is alert. Refused MMSE, 6CIT.  BMI- discussed the importance of a healthy diet, water intake and the benefits of aerobic exercise.  Educational material provided.  Physical activity- PT for balance.  Diet:   Regular; mostly fruit Water: good intake Caffeine: 2 cups of coffee  Advanced Directive: End of life planning; Advance aging; Advanced directives discussed.  Copy of current HCPOA/Living Will requested  on file.    Other Providers Patient Care Team: Burnard Hawthorne, FNP as PCP - General (Family Medicine)  Exercise Activities and Dietary recommendations Current Exercise Habits: Home exercise routine, Intensity: Mild  Goals    . DIET - INCREASE WATER INTAKE     Stay hydrated       Fall Risk Fall Risk  08/29/2019 08/10/2019 07/03/2019 01/09/2019 11/14/2018  Falls in the past year? 1 1 1  0 0  Comment - - - - -  Number falls in past yr: 1 1 1  0 -  Comment - - - - -  Injury with Fall? - - 1 0 -  Comment - - - - -  Risk for fall due to : - History of fall(s);Impaired mobility;Impaired balance/gait - - -  Follow up - - Falls evaluation completed - -   Timed Get Up and Go performed: no, virtual visit  Depression Screen PHQ 2/9 Scores 08/29/2019 07/03/2019 12/08/2018 11/14/2018  PHQ - 2 Score 0 0 0 0  PHQ- 9 Score 0 0 3 -     Cognitive Function MMSE - Mini Mental State Exam 08/29/2019 02/14/2018  Not completed: Refused -  Orientation to time - 5  Orientation to Place - 5  Registration - 3  Attention/ Calculation - 5  Recall - 2  Language- name 2 objects - 2  Language- repeat - 1  Language- follow 3 step command - 3  Language- read & follow direction - 1  Write a sentence - 1  Copy design - 1  Total score - 29        Immunization History  Administered Date(s) Administered  . Influenza, High Dose Seasonal PF 01/16/2018, 10/05/2018  . Influenza-Unspecified 09/28/2013, 10/09/2014, 09/08/2016, 09/27/2016, 09/12/2017, 01/16/2018  . Pneumococcal Conjugate-13 10/09/2014  . Pneumococcal Polysaccharide-23 02/14/2018  . Td 07/10/2014  . Tdap 07/24/2017   Screening Tests Health Maintenance  Topic Date Due  . DEXA SCAN  05/20/2005  . INFLUENZA VACCINE  08/31/2019 (Originally  07/07/2019)  . TETANUS/TDAP  07/25/2027  . PNA vac Low Risk Adult  Completed      Plan:    Keep all routine maintenance appointments.   Next scheduled lab 10/15/19 @ 1030  Medicare Attestation I have personally reviewed: The patient's medical and social history Their use of alcohol, tobacco or illicit drugs Their current medications and supplements The patient's functional ability including ADLs,fall risks, home safety risks, cognitive, and hearing and visual impairment Diet and physical activities Evidence for depression    In addition, I have reviewed and discussed with patient certain preventive protocols, quality metrics, and best practice recommendations. A written personalized care plan for preventive services as well as general preventive health recommendations were provided to patient via mail.     Varney Biles, LPN  [id number]  Agree with above  TMS

## 2019-08-31 ENCOUNTER — Ambulatory Visit: Payer: Medicare Other

## 2019-09-04 ENCOUNTER — Other Ambulatory Visit: Payer: Self-pay | Admitting: Family

## 2019-09-04 DIAGNOSIS — R058 Other specified cough: Secondary | ICD-10-CM

## 2019-09-04 DIAGNOSIS — J8410 Pulmonary fibrosis, unspecified: Secondary | ICD-10-CM

## 2019-09-04 DIAGNOSIS — R05 Cough: Secondary | ICD-10-CM

## 2019-09-05 ENCOUNTER — Encounter: Payer: Self-pay | Admitting: Student in an Organized Health Care Education/Training Program

## 2019-09-06 ENCOUNTER — Ambulatory Visit (INDEPENDENT_AMBULATORY_CARE_PROVIDER_SITE_OTHER): Payer: Medicare Other | Admitting: *Deleted

## 2019-09-06 ENCOUNTER — Ambulatory Visit
Payer: Medicare Other | Attending: Student in an Organized Health Care Education/Training Program | Admitting: Student in an Organized Health Care Education/Training Program

## 2019-09-06 ENCOUNTER — Other Ambulatory Visit: Payer: Self-pay

## 2019-09-06 ENCOUNTER — Encounter: Payer: Self-pay | Admitting: Student in an Organized Health Care Education/Training Program

## 2019-09-06 DIAGNOSIS — G8929 Other chronic pain: Secondary | ICD-10-CM

## 2019-09-06 DIAGNOSIS — G894 Chronic pain syndrome: Secondary | ICD-10-CM

## 2019-09-06 DIAGNOSIS — M19012 Primary osteoarthritis, left shoulder: Secondary | ICD-10-CM

## 2019-09-06 DIAGNOSIS — M25512 Pain in left shoulder: Secondary | ICD-10-CM | POA: Diagnosis not present

## 2019-09-06 DIAGNOSIS — I63219 Cerebral infarction due to unspecified occlusion or stenosis of unspecified vertebral arteries: Secondary | ICD-10-CM | POA: Diagnosis not present

## 2019-09-06 NOTE — Progress Notes (Signed)
Pain Management Virtual Encounter Note - Virtual Visit via [place] (real-time audio visits between healthcare provider and patient).   Patient's Phone No. & Preferred Pharmacy:  [phone] (home); [phone] (mobile); (Preferred) [phone] [email or web address]  [facility] #[id number] Lorina Rabon, [facility] [place] [address] [place] [address] Phone: [phone] Fax: [phone]    Pre-screening note:  Our staff contacted Ms. Oquin and offered her an "in person", "face-to-face" appointment versus a telephone encounter. She indicated preferring the telephone encounter, at this time.   Reason for Virtual Visit: COVID-19*  Social distancing based on CDC and AMA recommendations.   I contacted ROCHEL MOROYOQUI on 09/06/2019 via video conference.      I clearly identified myself as Gillis Santa, MD. I verified that I was speaking with the correct person using two identifiers (Name: MAEVEN LAWE, and date of birth: 1941/10/31).  Advanced Informed Consent I sought verbal advanced consent from [name] for virtual visit interactions. I informed Ms. Hoffmeier of possible security and privacy concerns, risks, and limitations associated with providing "not-in-person" medical evaluation and management services. I also informed Ms. Wanninger of the availability of "in-person" appointments. Finally, I informed her that there would be a charge for the virtual visit and that she could be  personally, fully or partially, financially responsible for it. Ms. Willcutt expressed understanding and agreed to proceed.   Historic Elements   Ms. DEVELYN BJELLAND is a 78 y.o. year old, female patient evaluated today after her last encounter by our practice on 08/16/2019. Ms. Legrow  has a past medical history of Arthritis, Atheromatous plaque (10/18/2017), Back pain, Cancer ([place]), Depression, Diverticulosis, Dizziness, GERD (gastroesophageal reflux disease), Heart  murmur, Hyperlipidemia (10/18/2017), PONV (postoperative nausea and vomiting), and Stroke ([place]). She also  has a past surgical history that includes Tonsillectomy; Facelift (89); Cervical disc surgery (09); Back surgery (13); Colonoscopy with propofol (N/A, 01/13/2016); Total hip arthroplasty (Right, 04/22/2016); TEE without cardioversion (N/A, 08/12/2017); LOOP RECORDER INSERTION (N/A, 08/23/2017); Breast excisional biopsy (Left, 90's); Breast biopsy (Bilateral); Cataract extraction w/PHACO (Left, 12/20/2018); and Cataract extraction w/PHACO (Right, 01/24/2019). Ms. Botkins has a current medication list which includes the following prescription(s): albuterol, amlodipine, atorvastatin, bupropion, clopidogrel, duloxetine, famotidine, fluticasone, pregabalin, duloxetine, tiotropium, and triamcinolone cream. She  reports that she quit smoking about 22 years ago. Her smoking use included cigarettes. She has a 40.00 pack-year smoking history. She has never used smokeless tobacco. She reports current alcohol use. She reports that she does not use drugs. Ms. Lingg is allergic to aleve [naproxen sodium]; aspirin; celebrex [celecoxib]; effexor [venlafaxine]; gabapentin; hydrocodone-homatropine; ibuprofen; tape; vioxx [rofecoxib]; other; and tramadol.   HPI  Today, she is being contacted for a post-procedure assessment.  Evaluation of last interventional procedure  08/08/2019 Procedure: Type: Diagnostic Glenohumeral Joint (shoulder) Injection #1  Primary Purpose: Diagnostic Region: Superior Shoulder Area Level:  Shoulder Target Area: Glenohumeral Joint (shoulder) Approach: Posterior approach. Laterality: Left-Sided  Influential Factors: Intra-procedural challenges: None observed.         Reported side-effects: None.        Post-procedural adverse reactions or complications: None reported         Sedation: Please see nurses note for DOS. When no sedatives are used, the analgesic levels obtained are directly  associated to the effectiveness of the local anesthetics. However, when sedation is provided, the level of analgesia obtained during the initial 1 hour following the intervention, is believed to be the result of a combination of factors. These factors  may include, but are not limited to: 1. The effectiveness of the local anesthetics used. 2. The effects of the analgesic(s) and/or anxiolytic(s) used. 3. The degree of discomfort experienced by the patient at the time of the procedure. 4. The patients ability and reliability in recalling and recording the events. 5. The presence and influence of possible secondary gains and/or psychosocial factors. Reported result: Relief experienced during the 1st hour after the procedure: 100 % (Ultra-Short Term Relief)            Interpretative annotation: Clinically appropriate result. Analgesia during this period is likely to be Local Anesthetic and/or IV Sedative (Analgesic/Anxiolytic) related.          Effects of local anesthetic: The analgesic effects attained during this period are directly associated to the localized infiltration of local anesthetics and therefore cary significant diagnostic value as to the etiological location, or anatomical origin, of the pain. Expected duration of relief is directly dependent on the pharmacodynamics of the local anesthetic used. Long-acting (4-6 hours) anesthetics used.  Reported result: Relief during the next 4 to 6 hour after the procedure: 100 % (Short-Term Relief)            Interpretative annotation: Clinically appropriate result. Analgesia during this period is likely to be Local Anesthetic-related.          Long-term benefit: Defined as the period of time past the expected duration of local anesthetics (1 hour for short-acting and 4-6 hours for long-acting). With the possible exception of prolonged sympathetic blockade from the local anesthetics, benefits during this period are typically attributed to, or associated  with, other factors such as analgesic sensory neuropraxia, antiinflammatory effects, or beneficial biochemical changes provided by agents other than the local anesthetics.  Reported result: Extended relief following procedure: 100 %(pain relieved for a couple of weeks.) (Long-Term Relief)            Interpretative annotation: Clinically appropriate result. Good relief. No permanent benefit expected. Inflammation plays a part in the etiology to the pain.           Repeat injection #2  UDS:  Summary  Date Value Ref Range Status  02/15/2018 FINAL  Final    Comment:    ==================================================================== TOXASSURE COMP DRUG ANALYSIS,UR ==================================================================== Test                             Result       Flag       Units Drug Present and Declared for Prescription Verification   Gabapentin                     PRESENT      EXPECTED   Bupropion                      PRESENT      EXPECTED   Hydroxybupropion               PRESENT      EXPECTED    Hydroxybupropion is an expected metabolite of bupropion.   Duloxetine                     PRESENT      EXPECTED ==================================================================== Test                      Result    Flag   Units  Ref Range   Creatinine              90               mg/dL      >=20 ==================================================================== Declared Medications:  The flagging and interpretation on this report are based on the  following declared medications.  Unexpected results may arise from  inaccuracies in the declared medications.  **Note: The testing scope of this panel includes these medications:  Bupropion (Wellbutrin)  Duloxetine (Cymbalta)  Gabapentin  **Note: The testing scope of this panel does not include following  reported medications:  Atorvastatin (Lipitor)  Calcium carbonate (Calcium carbonate/Vitamin D)  Clopidogrel  (Plavix)  Cyanocobalamin  Iron (Ferrous Sulfate)  Mupirocin (Bactroban)  Triamcinolone (Kenalog)  Vitamin C  Vitamin D (Calcium carbonate/Vitamin D) ==================================================================== For clinical consultation, please call [phone]. ====================================================================    Laboratory Chemistry Profile (12 mo)  Renal: 06/25/2019: BUN 15; Creatinine, Ser 0.92  Lab Results  Component Value Date   GFR 61.23 04/07/2018   GFRAA >60 06/25/2019   GFRNONAA 59 (L) 06/25/2019   Hepatic: 06/25/2019: Albumin 4.2 Lab Results  Component Value Date   AST 22 06/25/2019   ALT 19 06/25/2019   Other: 08/10/2019: Vitamin B-12 781 Note: Above Lab results reviewed.  Imaging  Last 90 days:  Ct Head Wo Contrast  Result Date: 08/10/2019 CLINICAL DATA:  Head trauma, ataxia EXAM: CT HEAD WITHOUT CONTRAST TECHNIQUE: Contiguous axial images were obtained from the base of the skull through the vertex without intravenous contrast. COMPARISON:  06/13/2019 FINDINGS: Brain: Generalized atrophy. Normal ventricular morphology. No midline shift or mass effect. Small vessel chronic ischemic changes of deep cerebral white matter. Old RIGHT frontal and LEFT parieto-occipital infarcts. Probable small old deep infarct LEFT cerebellum. No intracranial hemorrhage, mass lesion or evidence of acute infarction. No extra-axial fluid collections. Vascular: Atherosclerotic calcifications of internal carotid arteries at skull base. No hyperdense vessels. Skull: Intact Sinuses/Orbits: Clear Other: N/A IMPRESSION: Atrophy with small vessel chronic ischemic changes of deep cerebral white matter. Old infarcts RIGHT frontal, LEFT parieto-occipital, and probably deep LEFT cerebellum. No acute intracranial abnormalities. Electronically Signed   By: Lavonia Dana M.D.   On: 08/10/2019 13:11   Ct Head Wo Contrast  Result Date: 06/13/2019 CLINICAL DATA:  Mental status changes  since last evening. Dizziness. EXAM: CT HEAD WITHOUT CONTRAST TECHNIQUE: Contiguous axial images were obtained from the base of the skull through the vertex without intravenous contrast. COMPARISON:  04/11/2018 FINDINGS: Brain: Stable age related cerebral atrophy, ventriculomegaly and periventricular white matter disease. No extra-axial fluid collections are identified. No CT findings for acute hemispheric infarction or intracranial hemorrhage. No mass lesions. Remote left occipital infarct. The brainstem and cerebellum are normal. Vascular: Stable vascular calcifications. No definite aneurysm or hyperdense vessels. Skull: No skull fracture or bone lesions. Sinuses/Orbits: The paranasal sinuses and mastoid air cells are clear. The globes are intact. Other: No scalp lesions or hematoma. IMPRESSION: 1. Stable age related cerebral atrophy, ventriculomegaly and periventricular white matter disease. 2. Remote occipital infarct on the left. 3. No acute intracranial findings or mass lesion. Electronically Signed   By: Marijo Sanes M.D.   On: 06/13/2019 10:44   Mr Brain Wo Contrast  Result Date: 06/13/2019 CLINICAL DATA:  Acute presentation with speech disturbance. History of recent stroke. EXAM: MRI HEAD WITHOUT CONTRAST TECHNIQUE: Multiplanar, multiecho pulse sequences of the brain and surrounding structures were obtained without intravenous contrast. COMPARISON:  Head CT 06/13/2019 FINDINGS: Brain: Diffusion imaging  shows a 2.5 Cm region of acute infarction in the right posterior frontal region, cortical and subcortical. No mass effect or hemorrhage. Brainstem is normal. There is an old small vessel infarction in the inferior left cerebellum. There are a few old small vessel infarctions in the superior cerebellum. Cerebral hemispheres show an old infarction at the left parieto-occipital junction affecting the cortical and subcortical brain and mild chronic small-vessel ischemic change the white matter on both sides.  No mass lesion, hemorrhage, hydrocephalus or extra-axial collection. Vascular: Major vessels at the base of the brain show flow. Skull and upper cervical spine: Negative Sinuses/Orbits: Clear/normal Other: None IMPRESSION: 2.5 cm region of acute infarction in the right posterior frontal cortical and subcortical brain. No mass effect or hemorrhage. Old left parieto-occipital cortical and subcortical infarction. Mild chronic small-vessel ischemic change of the hemispheric white matter. Electronically Signed   By: Nelson Chimes M.D.   On: 06/13/2019 21:52   US Carotid Bilateral (at [facility])  Result Date: 06/13/2019 CLINICAL DATA:  78 year old female with a history of stroke EXAM: BILATERAL CAROTID DUPLEX ULTRASOUND TECHNIQUE: Pearline Cables scale imaging, color Doppler and duplex ultrasound were performed of bilateral carotid and vertebral arteries in the neck. COMPARISON:  None. FINDINGS: Criteria: Quantification of carotid stenosis is based on velocity parameters that correlate the residual internal carotid diameter with NASCET-based stenosis levels, using the diameter of the distal internal carotid lumen as the denominator for stenosis measurement. The following velocity measurements were obtained: RIGHT ICA:  Systolic 96 cm/sec, Diastolic 30 cm/sec CCA:  71 cm/sec SYSTOLIC ICA/CCA RATIO:  1.3 ECA:  75 cm/sec LEFT ICA:  Systolic 123XX123 cm/sec, Diastolic 27 cm/sec CCA:  95 cm/sec SYSTOLIC ICA/CCA RATIO:  1.2 ECA:  89 cm/sec Right Brachial SBP: Not acquired Left Brachial SBP: Not acquired RIGHT CAROTID ARTERY: No significant calcifications of the right common carotid artery. Intermediate waveform maintained. Heterogeneous and partially calcified plaque at the right carotid bifurcation. No significant lumen shadowing. Low resistance waveform of the right ICA. No significant tortuosity. RIGHT VERTEBRAL ARTERY: Antegrade flow with low resistance waveform. LEFT CAROTID ARTERY: No significant calcifications of the left  common carotid artery. Intermediate waveform maintained. Heterogeneous and partially calcified plaque at the left carotid bifurcation without significant lumen shadowing. Low resistance waveform of the left ICA. No significant tortuosity. LEFT VERTEBRAL ARTERY:  Antegrade flow with low resistance waveform. IMPRESSION: Color duplex indicates minimal heterogeneous and calcified plaque, with no hemodynamically significant stenosis by duplex criteria in the extracranial cerebrovascular circulation. Signed, Dulcy Fanny. Dellia Nims, RPVI [facility] Electronically Signed   By: Corrie Mckusick D.O.   On: 06/13/2019 17:00   [facility] C-arm 1-60 Min No Report  Result Date: 08/08/2019 Fluoro was used, but no Radiologist interpretation will be provided. Please refer to "NOTES" tab for provider progress note.   Assessment  The primary encounter diagnosis was Primary osteoarthritis of left shoulder. Diagnoses of Chronic left shoulder pain and Chronic pain syndrome were also pertinent to this visit.  1. Primary osteoarthritis of left shoulder -100% pain relief and left shoulder pain for approximately 2 weeks.  Repeat injection #2 - SHOULDER INJECTION; Future  2. Chronic left shoulder pain -As above - SHOULDER INJECTION; Future    Plan of Care  I am having [name] maintain her DULoxetine, buPROPion, triamcinolone cream, famotidine, tiotropium, fluticasone, clopidogrel, amLODipine, atorvastatin, pregabalin, albuterol, and DULoxetine.  Orders:  Orders Placed This Encounter  Procedures  . SHOULDER INJECTION  Standing Status:   Future    Standing Expiration Date:   10/06/2019    Scheduling Instructions:     Side: left     Sedation: Without Sedation.     Timeframe: As soon as schedule allows    Order Specific Question:   Where will this procedure be performed?    Answer:   [facility]    Comments:   Tanaya Dunigan   Follow-up  plan:   Return in about 1 week (around 09/13/2019) for Procedure LEFT shoulder injection #2 , without sedation.     Status post left shoulder intra-articular injection, posterior approach on 08/08/2019 helped, provided significant pain relief for 2 weeks with gradual return of pain thereafter.  Discussed repeating injection #2.     Recent Visits Date Type Provider Dept  08/08/19 Procedure visit Gillis Santa, MD [facility]  07/24/19 Office Visit Gillis Santa, MD [facility]  Showing recent visits within past 90 days and meeting all other requirements   Today's Visits Date Type Provider Dept  09/06/19 Office Visit Gillis Santa, MD [facility]  Showing today's visits and meeting all other requirements   Future Appointments No visits were found meeting these conditions.  Showing future appointments within next 90 days and meeting all other requirements   I discussed the assessment and treatment plan with the patient. The patient was provided an opportunity to ask questions and all were answered. The patient agreed with the plan and demonstrated an understanding of the instructions.  Patient advised to call back or seek an in-person evaluation if the symptoms or condition worsens.  Total duration of non-face-to-face encounter: 68minutes.  Note by: Gillis Santa, MD Date: 09/06/2019; Time: 10:10 AM  Note: This dictation was prepared with Dragon dictation. Any transcriptional errors that may result from this process are unintentional.  Disclaimer:  * Given the special circumstances of the COVID-19 pandemic, the federal government has announced that the Office for Civil Rights (OCR) will exercise its enforcement discretion and will not impose penalties on physicians using telehealth in the event of noncompliance with regulatory requirements under the [place] (HIPAA) in connection with the good faith provision of telehealth  during the XX123456 national public health emergency. ([place])

## 2019-09-08 LAB — CUP PACEART REMOTE DEVICE CHECK
Date Time Interrogation Session: 20201002014211
Implantable Pulse Generator Implant Date: 20180918

## 2019-09-13 NOTE — Progress Notes (Signed)
Carelink Summary Report / Loop Recorder 

## 2019-09-17 ENCOUNTER — Ambulatory Visit
Admission: RE | Admit: 2019-09-17 | Discharge: 2019-09-17 | Disposition: A | Discharge status: Home / Self Care | Payer: Medicare Other | Source: Ambulatory Visit | Attending: Student in an Organized Health Care Education/Training Program | Admitting: Student in an Organized Health Care Education/Training Program

## 2019-09-17 ENCOUNTER — Other Ambulatory Visit: Payer: Self-pay

## 2019-09-17 ENCOUNTER — Ambulatory Visit (HOSPITAL_BASED_OUTPATIENT_CLINIC_OR_DEPARTMENT_OTHER): Payer: Medicare Other | Admitting: Student in an Organized Health Care Education/Training Program

## 2019-09-17 ENCOUNTER — Encounter: Payer: Self-pay | Admitting: Student in an Organized Health Care Education/Training Program

## 2019-09-17 VITALS — BP 152/90 | HR 81 | Temp 98.1°F | Resp 14 | Ht 64.0 in | Wt 204.0 lb

## 2019-09-17 DIAGNOSIS — M19012 Primary osteoarthritis, left shoulder: Secondary | ICD-10-CM | POA: Diagnosis not present

## 2019-09-17 DIAGNOSIS — G8929 Other chronic pain: Secondary | ICD-10-CM | POA: Diagnosis not present

## 2019-09-17 DIAGNOSIS — G894 Chronic pain syndrome: Secondary | ICD-10-CM | POA: Insufficient documentation

## 2019-09-17 DIAGNOSIS — M25512 Pain in left shoulder: Secondary | ICD-10-CM | POA: Diagnosis not present

## 2019-09-17 MED ORDER — ROPIVACAINE HCL 2 MG/ML IJ SOLN
INTRAMUSCULAR | Status: AC
  Filled 2019-09-17: qty 10

## 2019-09-17 MED ORDER — DEXAMETHASONE SODIUM PHOSPHATE 10 MG/ML IJ SOLN
INTRAMUSCULAR | Status: AC
  Filled 2019-09-17: qty 1

## 2019-09-17 MED ORDER — ROPIVACAINE HCL 2 MG/ML IJ SOLN: 1.0000 mL | Freq: Once | INTRAMUSCULAR | Status: DC

## 2019-09-17 MED ORDER — LIDOCAINE HCL 2 % IJ SOLN
INTRAMUSCULAR | Status: AC
  Filled 2019-09-17: qty 20

## 2019-09-17 MED ORDER — LIDOCAINE HCL 2 % IJ SOLN: 20.0000 mL | Freq: Once | INTRAMUSCULAR | Status: DC

## 2019-09-17 MED ORDER — IOHEXOL 180 MG/ML  SOLN: 10.0000 mL | Freq: Once | INTRAMUSCULAR | Status: DC

## 2019-09-17 MED ORDER — DEXAMETHASONE SODIUM PHOSPHATE 10 MG/ML IJ SOLN: 10.0000 mg | Freq: Once | INTRAMUSCULAR | Status: DC

## 2019-09-17 NOTE — Patient Instructions (Signed)
Trigger Point Injection Trigger points are areas where you have pain. A trigger point injection is a shot given in the trigger point to help relieve pain for a few days to a few months. Common places for trigger points include:  The neck.  The shoulders.  The upper back.  The lower back. A trigger point injection will not cure long-term (chronic) pain permanently. These injections do not always work for every person. For some people, they can help to relieve pain for a few days to a few months. Tell a health care provider about:  Any allergies you have.  All medicines you are taking, including vitamins, herbs, eye drops, creams, and over-the-counter medicines.  Any problems you or family members have had with anesthetic medicines.  Any blood disorders you have.  Any surgeries you have had.  Any medical conditions you have. What are the risks? Generally, this is a safe procedure. However, problems may occur, including:  Infection.  Bleeding or bruising.  Allergic reaction to the injected medicine.  Irritation of the skin around the injection site. What happens before the procedure? Ask your health care provider about:  Changing or stopping your regular medicines. This is especially important if you are taking diabetes medicines or blood thinners.  Taking medicines such as aspirin and ibuprofen. These medicines can thin your blood. Do not take these medicines unless your health care provider tells you to take them.  Taking over-the-counter medicines, vitamins, herbs, and supplements. What happens during the procedure?   Your health care provider will feel for trigger points. A marker may be used to circle the area for the injection.  The skin over the trigger point will be washed with a germ-killing (antiseptic) solution.  A thin needle is used for the injection. You may feel pain or a twitching feeling when the needle enters the trigger point.  A numbing solution may  be injected into the trigger point. Sometimes a medicine to keep down inflammation is also injected.  Your health care provider may move the needle around the area where the trigger point is located until the tightness and twitching goes away.  After the injection, your health care provider may put gentle pressure over the injection site.  The injection site will be covered with a bandage (dressing). The procedure may vary among health care providers and hospitals. What can I expect after treatment? After treatment, you may have:  Soreness and stiffness for 1-2 days.  A dressing. This can be taken off in a few hours or as told by your health care provider. Follow these instructions at home: Injection site care  Remove your dressing as told by your health care provider.  Check your injection site every day for signs of infection. Check for: ? Redness, swelling, or pain. ? Fluid or blood. ? Warmth. ? Pus or a bad smell. Managing pain, stiffness, and swelling  If directed, put ice on the affected area. ? Put ice in a plastic bag. ? Place a towel between your skin and the bag. ? Leave the ice on for 20 minutes, 2-3 times a day. General instructions  If you were asked to stop your regular medicines, ask your health care provider when you may start taking them again.  Return to your normal activities as told by your health care provider. Ask your health care provider what activities are safe for you.  Do not take baths, swim, or use a hot tub until your health care provider approves.    You may be asked to see an occupational or physical therapist for exercises that reduce muscle strain and stretch the area of the trigger point.  Keep all follow-up visits as told by your health care provider. This is important. Contact a health care provider if:  Your pain comes back, and it is worse than before the injection. You may need more injections.  You have chills or a fever.  The  injection site becomes more painful, red, swollen, or warm to the touch. Summary  A trigger point injection is a shot given in the trigger point to help relieve pain for a few days to a few months.  Common places for trigger point injections are the neck, shoulder, upper back, and lower back.  These injections do not always work for every person, but for some people, the injections can help to relieve pain for a few days to a few months.  Contact a health care provider if symptoms come back or they are worse than before treatment. Also, get help if the injection site becomes more painful, red, swollen, or warm to the touch. This information is not intended to replace advice given to you by your health care provider. Make sure you discuss any questions you have with your health care provider. Document Released: 11/11/2011 Document Revised: 01/03/2019 Document Reviewed: 01/03/2019 Elsevier Patient Education  [place]. Pain Management Discharge Instructions  General Discharge Instructions :  If you need to reach your doctor call: Monday-Friday 8:00 am - 4:00 pm at [phone] or toll free [phone].  After clinic hours [phone] to have operator reach doctor.  Bring all of your medication bottles to all your appointments in the pain clinic.  To cancel or reschedule your appointment with Pain Management please remember to call 24 hours in advance to avoid a fee.  Refer to the educational materials which you have been given on: General Risks, I had my Procedure. Discharge Instructions, Post Sedation.  Post Procedure Instructions:  The drugs you were given will stay in your system until tomorrow, so for the next 24 hours you should not drive, make any legal decisions or drink any alcoholic beverages.  You may eat anything you prefer, but it is better to start with liquids then soups and crackers, and gradually work up to solid foods.  Please notify your doctor immediately  if you have any unusual bleeding, trouble breathing or pain that is not related to your normal pain.  Depending on the type of procedure that was done, some parts of your body may feel week and/or numb.  This usually clears up by tonight or the next day.  Walk with the use of an assistive device or accompanied by an adult for the 24 hours.  You may use ice on the affected area for the first 24 hours.  Put ice in a Ziploc bag and cover with a towel and place against area 15 minutes on 15 minutes off.  You may switch to heat after 24 hours.

## 2019-09-17 NOTE — Progress Notes (Signed)
Patient's Name: Melody Ochoa  MRN: [id number]  Referring Provider: Burnard Hawthorne, FNP  DOB: 05/12/1941  PCP: Burnard Hawthorne, FNP  DOS: 09/17/2019  Note by: Gillis Santa, MD  Service setting: Ambulatory outpatient  Specialty: Interventional Pain Management  Patient type: Established  Location: [facility] (AMB) Pain Management Facility  Visit type: Interventional Procedure   Primary Reason for Visit: Interventional Pain Management Treatment. CC: Shoulder Pain (left )  Procedure:          Anesthesia, Analgesia, Anxiolysis:  Type: Therapeutic Glenohumeral Joint (shoulder) Injection #2 (#1 on 08/08/2019) Primary Purpose: Therapeutic Region: Superior Shoulder Area Level:  Shoulder Target Area: Glenohumeral Joint (shoulder) Approach: Posterior approach. Laterality: Left-Sided  Type: Local Anesthesia  Local Anesthetic: Lidocaine 1-2%  Position: Supine   Indications: 1. Primary osteoarthritis of left shoulder   2. Chronic left shoulder pain   3. Chronic pain syndrome    Pain Score: Pre-procedure: 6 /10 Post-procedure: 0-No pain/10   Patient stopped Plavix 7 days prior to procedure.   Pre-op Assessment:  Melody Ochoa is a 78 y.o. (year old), female patient, seen today for interventional treatment. She  has a past surgical history that includes Tonsillectomy; Facelift (94); Cervical disc surgery (09); Back surgery (13); Colonoscopy with propofol (N/A, 01/13/2016); Total hip arthroplasty (Right, 04/22/2016); TEE without cardioversion (N/A, 08/12/2017); LOOP RECORDER INSERTION (N/A, 08/23/2017); Breast excisional biopsy (Left, 90's); Breast biopsy (Bilateral); Cataract extraction w/PHACO (Left, 12/20/2018); and Cataract extraction w/PHACO (Right, 01/24/2019). Melody Ochoa has a current medication list which includes the following prescription(s): albuterol, amlodipine, atorvastatin, clopidogrel, duloxetine, famotidine, fluticasone, pregabalin, triamcinolone cream, bupropion, duloxetine, and  tiotropium, and the following Facility-Administered Medications: dexamethasone, iohexol, lidocaine, and ropivacaine (pf) 2 mg/ml (0.2%). Her primarily concern today is the Shoulder Pain (left )  Initial Vital Signs:  Pulse/HCG Rate: 81ECG Heart Rate: 80 Temp: 98.1 F (36.7 C) Resp: 16 BP: (!) 120/101 SpO2: 98 %  BMI: Estimated body mass index is 35.02 kg/m as calculated from the following:   Height as of this encounter: 5\' 4"  (1.626 m).   Weight as of this encounter: 204 lb (92.5 kg).  Risk Assessment: Allergies: Reviewed. She is allergic to aleve [naproxen sodium]; aspirin; celebrex [celecoxib]; effexor [venlafaxine]; gabapentin; hydrocodone-homatropine; ibuprofen; tape; vioxx [rofecoxib]; other; and tramadol.  Allergy Precautions: None required Coagulopathies: Reviewed. None identified.  Blood-thinner therapy: None at this time Active Infection(s): Reviewed. None identified. Melody Ochoa is afebrile  Site Confirmation: Melody Ochoa was asked to confirm the procedure and laterality before marking the site Procedure checklist: Completed Consent: Before the procedure and under the influence of no sedative(s), amnesic(s), or anxiolytics, the patient was informed of the treatment options, risks and possible complications. To fulfill our ethical and legal obligations, as recommended by the American Medical Association's Code of Ethics, I have informed the patient of my clinical impression; the nature and purpose of the treatment or procedure; the risks, benefits, and possible complications of the intervention; the alternatives, including doing nothing; the risk(s) and benefit(s) of the alternative treatment(s) or procedure(s); and the risk(s) and benefit(s) of doing nothing. The patient was provided information about the general risks and possible complications associated with the procedure. These may include, but are not limited to: failure to achieve desired goals, infection, bleeding, organ  or nerve damage, allergic reactions, paralysis, and death. In addition, the patient was informed of those risks and complications associated to the procedure, such as failure to decrease pain; infection; bleeding; organ or nerve damage with subsequent damage to sensory,  motor, and/or autonomic systems, resulting in permanent pain, numbness, and/or weakness of one or several areas of the body; allergic reactions; (i.e.: anaphylactic reaction); and/or death. Furthermore, the patient was informed of those risks and complications associated with the medications. These include, but are not limited to: allergic reactions (i.e.: anaphylactic or anaphylactoid reaction(s)); adrenal axis suppression; blood sugar elevation that in diabetics may result in ketoacidosis or comma; water retention that in patients with history of congestive heart failure may result in shortness of breath, pulmonary edema, and decompensation with resultant heart failure; weight gain; swelling or edema; medication-induced neural toxicity; particulate matter embolism and blood vessel occlusion with resultant organ, and/or nervous system infarction; and/or aseptic necrosis of one or more joints. Finally, the patient was informed that Medicine is not an exact science; therefore, there is also the possibility of unforeseen or unpredictable risks and/or possible complications that may result in a catastrophic outcome. The patient indicated having understood very clearly. We have given the patient no guarantees and we have made no promises. Enough time was given to the patient to ask questions, all of which were answered to the patient's satisfaction. Melody Ochoa has indicated that she wanted to continue with the procedure. Attestation: I, the ordering provider, attest that I have discussed with the patient the benefits, risks, side-effects, alternatives, likelihood of achieving goals, and potential problems during recovery for the procedure that I  have provided informed consent. Date  Time: 09/17/2019 11:40 AM  Pre-Procedure Preparation:  Monitoring: As per clinic protocol. Respiration, ETCO2, SpO2, BP, heart rate and rhythm monitor placed and checked for adequate function Safety Precautions: Patient was assessed for positional comfort and pressure points before starting the procedure. Time-out: I initiated and conducted the "Time-out" before starting the procedure, as per protocol. The patient was asked to participate by confirming the accuracy of the "Time Out" information. Verification of the correct person, site, and procedure were performed and confirmed by me, the nursing staff, and the patient. "Time-out" conducted as per Joint Commission's Universal Protocol (UP.01.01.01). Time: 1231  Description of Procedure:          Area Prepped: Entire shoulder Area Prepping solution: DuraPrep (Iodine Povacrylex [0.7% available iodine] and Isopropyl Alcohol, 74% w/w) Safety Precautions: Aspiration looking for blood return was conducted prior to all injections. At no point did we inject any substances, as a needle was being advanced. No attempts were made at seeking any paresthesias. Safe injection practices and needle disposal techniques used. Medications properly checked for expiration dates. SDV (single dose vial) medications used. Description of the Procedure: Protocol guidelines were followed. The patient was placed in position over the procedure table. The target area was identified and the area prepped in the usual manner. Skin & deeper tissues infiltrated with local anesthetic. Appropriate amount of time allowed to pass for local anesthetics to take effect. The procedure needles were then advanced to the target area. Proper needle placement secured. Negative aspiration confirmed. Solution injected in intermittent fashion, asking for systemic symptoms every 0.5cc of injectate. The needles were then removed and the area cleansed, making sure to  leave some of the prepping solution back to take advantage of its long term bactericidal properties.    Vitals:   09/17/19 1146 09/17/19 1231 09/17/19 1240  BP: (!) 120/101 (!) 150/77 (!) 152/90  Pulse: 81    Resp: 16 18 14   Temp: 98.1 F (36.7 C)    TempSrc: Oral    SpO2: 98% 95% 96%  Weight: 204 lb (92.5  kg)    Height: 5\' 4"  (1.626 m)      Start Time: 1231 hrs. End Time: 1238 hrs. Materials:  Needle(s) Type: Spinal Needle Gauge: 25G Length: 3.5-in Medication(s): Please see orders for medications and dosing details. 4 cc solution made of 3 cc of 0.2% ropivacaine, 1 cc of Decadron 10 mg/cc. Imaging Guidance (Non-Spinal):          Type of Imaging Technique: Fluoroscopy Guidance (Non-Spinal) Indication(s): Assistance in needle guidance and placement for procedures requiring needle placement in or near specific anatomical locations not easily accessible without such assistance. Exposure Time: Please see nurses notes. Contrast: Before injecting any contrast, we confirmed that the patient did not have an allergy to iodine, shellfish, or radiological contrast. Once satisfactory needle placement was completed at the desired level, radiological contrast was injected. Contrast injected under live fluoroscopy. No contrast complications. See chart for type and volume of contrast used. Fluoroscopic Guidance: I was personally present during the use of fluoroscopy. "Tunnel Vision Technique" used to obtain the best possible view of the target area. Parallax error corrected before commencing the procedure. "Direction-depth-direction" technique used to introduce the needle under continuous pulsed fluoroscopy. Once target was reached, antero-posterior, oblique, and lateral fluoroscopic projection used confirm needle placement in all planes. Images permanently stored in EMR. Interpretation: I personally interpreted the imaging intraoperatively. Adequate needle placement confirmed in multiple planes.  Appropriate spread of contrast into desired area was observed. No evidence of afferent or efferent intravascular uptake. Permanent images saved into the patient's record.  Antibiotic Prophylaxis:   Anti-infectives (From admission, onward)   None     Indication(s): None identified  Post-operative Assessment:  Post-procedure Vital Signs:  Pulse/HCG Rate: 8178 Temp: 98.1 F (36.7 C) Resp: 14 BP: (!) 152/90 SpO2: 96 %  EBL: None  Complications: No immediate post-treatment complications observed by team, or reported by patient.  Note: The patient tolerated the entire procedure well. A repeat set of vitals were taken after the procedure and the patient was kept under observation following institutional policy, for this type of procedure. Post-procedural neurological assessment was performed, showing return to baseline, prior to discharge. The patient was provided with post-procedure discharge instructions, including a section on how to identify potential problems. Should any problems arise concerning this procedure, the patient was given instructions to immediately contact us, at any time, without hesitation. In any case, we plan to contact the patient by telephone for a follow-up status report regarding this interventional procedure.  Comments:  No additional relevant information. Improve range of motion for the left shoulder with shoulder abduction postprocedure.  Patient instructed to restart Plavix tomorrow. Plan of Care  Orders:  Orders Placed This Encounter  Procedures  . DG PAIN [facility] C-ARM 1-60 MIN NO REPORT    Intraoperative interpretation by procedural physician at [facility].    Standing Status:   Standing    Number of Occurrences:   1    Order Specific Question:   Reason for exam:    Answer:   Assistance in needle guidance and placement for procedures requiring needle placement in or near specific anatomical locations not easily accessible without such  assistance.   Medications ordered for procedure: Meds ordered this encounter  Medications  . iohexol (OMNIPAQUE) 180 MG/ML injection 10 mL    Must be Myelogram-compatible. If not available, you may substitute with a water-soluble, non-ionic, hypoallergenic, myelogram-compatible radiological contrast medium.  Marland Kitchen lidocaine (XYLOCAINE) 2 % (with pres) injection 400 mg  . ropivacaine (PF)  2 mg/mL (0.2%) (NAROPIN) injection 1 mL  . dexamethasone (DECADRON) injection 10 mg   Medications administered: [name] had no medications administered during this visit.  See the medical record for exact dosing, route, and time of administration.  Follow-up plan:   Return in about 4 weeks (around 10/15/2019) for Post Procedure Evaluation, virtual.     Status post left shoulder intra-articular injection, posterior approach on 08/08/2019, 09/17/2019.    Recent Visits Date Type Provider Dept  09/06/19 Office Visit Gillis Santa, MD [facility]  08/08/19 Procedure visit Gillis Santa, MD [facility]  07/24/19 Office Visit Gillis Santa, MD [facility]  Showing recent visits within past 90 days and meeting all other requirements   Today's Visits Date Type Provider Dept  09/17/19 Procedure visit Gillis Santa, MD [facility]  Showing today's visits and meeting all other requirements   Future Appointments Date Type Provider Dept  10/16/19 Appointment Gillis Santa, MD [facility]  Showing future appointments within next 90 days and meeting all other requirements   Disposition: Discharge home  Discharge Date & Time: 09/17/2019; 1250 hrs.   Primary Care Physician: Burnard Hawthorne, FNP Location: [facility] Note by: Gillis Santa, MD Date: 09/17/2019; Time: 12:59 PM  Disclaimer:  Medicine is not an exact science. The only guarantee in medicine is that nothing is guaranteed. It is important to note that the decision  to proceed with this intervention was based on the information collected from the patient. The Data and conclusions were drawn from the patient's questionnaire, the interview, and the physical examination. Because the information was provided in large part by the patient, it cannot be guaranteed that it has not been purposely or unconsciously manipulated. Every effort has been made to obtain as much relevant data as possible for this evaluation. It is important to note that the conclusions that lead to this procedure are derived in large part from the available data. Always take into account that the treatment will also be dependent on availability of resources and existing treatment guidelines, considered by other Pain Management Practitioners as being common knowledge and practice, at the time of the intervention. For Medico-Legal purposes, it is also important to point out that variation in procedural techniques and pharmacological choices are the acceptable norm. The indications, contraindications, technique, and results of the above procedure should only be interpreted and judged by a Board-Certified Interventional Pain Specialist with extensive familiarity and expertise in the same exact procedure and technique.

## 2019-09-18 ENCOUNTER — Telehealth: Payer: Self-pay | Admitting: *Deleted

## 2019-09-18 NOTE — Telephone Encounter (Signed)
No problems post procedure. 

## 2019-09-24 ENCOUNTER — Telehealth: Payer: Self-pay

## 2019-09-24 ENCOUNTER — Ambulatory Visit: Payer: Self-pay

## 2019-09-24 NOTE — Telephone Encounter (Signed)
Please follow-up with the patient to ensure she went to be evaluated on Monday.

## 2019-09-24 NOTE — Telephone Encounter (Signed)
Patient called stating that she is dizzy and lightheaded.  She states when she stands she is off balance and needs assistance. She feels at times she will pass out but has not. She is weaker on the left side. She had similar symptoms once before and went to the ER and was diagnosed with stroke. Per protocol patient will go to ER for evaluation of her symptoms.  Care advice read to patient. Patient is with her husband has he will drive her to the ER. Note will be routed to office.  Reason for Disposition . SEVERE dizziness (e.g., unable to stand, requires support to walk, feels like passing out now)  Answer Assessment - Initial Assessment Questions 1. DESCRIPTION: "Describe your dizziness."     Lightheaded and unbalance 2. LIGHTHEADED: "Do you feel lightheaded?" (e.g., somewhat faint, woozy, weak upon standing)     All the time somewhat faint 3. VERTIGO: "Do you feel like either you or the room is spinning or tilting?" (i.e. vertigo)    Unsure 4. SEVERITY: "How bad is it?"  "Do you feel like you are going to faint?" "Can you stand and walk?"   - MILD - walking normally   - MODERATE - interferes with normal activities (e.g., work, school)    - SEVERE - unable to stand, requires support to walk, feels like passing out now.     Requires support to stand Moderate 5. ONSET:  "When did the dizziness begin?"     2 days ago 6. AGGRAVATING FACTORS: "Does anything make it worse?" (e.g., standing, change in head position)   standing 7. HEART RATE: "Can you tell me your heart rate?" "How many beats in 15 seconds?"  (Note: not all patients can do this)       84 8. CAUSE: "What do you think is causing the dizziness?"     unsure 9. RECURRENT SYMPTOM: "Have you had dizziness before?" If so, ask: "When was the last time?" "What happened that time?"     Yes went er 10. OTHER SYMPTOMS: "Do you have any other symptoms?" (e.g., fever, chest pain, vomiting, diarrhea, bleeding)       no 11. PREGNANCY: "Is there  any chance you are pregnant?" "When was your last menstrual period?"       No  Protocols used: DIZZINESS [facility]

## 2019-09-24 NOTE — Telephone Encounter (Signed)
Copied from [place] [id number]. Topic: General - Other >> Sep 24, 2019  2:06 PM Melody Ochoa wrote: Reason for CRM: Patient called back to inform the Triage nurse that she will not go to the ED she will wait and see if things get any worst and then maybe she will go.

## 2019-09-26 NOTE — Telephone Encounter (Signed)
I called and left another message on patient's home phone asking her to please call us back.

## 2019-09-26 NOTE — Telephone Encounter (Signed)
LM asking patient to call back so I could truage & getr her in for an appointment.

## 2019-09-26 NOTE — Telephone Encounter (Signed)
I called patient & left detailed VM asking patien to call back. I also let patient know on message that I was unsure if something was wrong with her home phone. It has been busy since a little after 8am.

## 2019-09-26 NOTE — Telephone Encounter (Signed)
I have tried to call patient, but line was busy.

## 2019-09-26 NOTE — Telephone Encounter (Signed)
Tried to call patient, but line was busy.

## 2019-09-27 ENCOUNTER — Encounter: Payer: Self-pay | Admitting: Family

## 2019-09-27 NOTE — Telephone Encounter (Signed)
Patient has responded via mychart. She stated that she is fine as well as BP. She attributed dizziness to lyrica. I asked that she let us get her in for an appointment.

## 2019-09-30 ENCOUNTER — Observation Stay
Admission: EM | Admit: 2019-09-30 | Discharge: 2019-10-01 | Disposition: A | Discharge status: Home / Self Care | Payer: Medicare Other | Attending: Internal Medicine | Admitting: Internal Medicine

## 2019-09-30 ENCOUNTER — Encounter: Payer: Self-pay | Admitting: Emergency Medicine

## 2019-09-30 ENCOUNTER — Emergency Department: Payer: Medicare Other

## 2019-09-30 ENCOUNTER — Other Ambulatory Visit: Payer: Self-pay

## 2019-09-30 DIAGNOSIS — M797 Fibromyalgia: Secondary | ICD-10-CM | POA: Insufficient documentation

## 2019-09-30 DIAGNOSIS — Z79899 Other long term (current) drug therapy: Secondary | ICD-10-CM | POA: Insufficient documentation

## 2019-09-30 DIAGNOSIS — Z8673 Personal history of transient ischemic attack (TIA), and cerebral infarction without residual deficits: Secondary | ICD-10-CM | POA: Insufficient documentation

## 2019-09-30 DIAGNOSIS — G459 Transient cerebral ischemic attack, unspecified: Secondary | ICD-10-CM | POA: Diagnosis not present

## 2019-09-30 DIAGNOSIS — M1990 Unspecified osteoarthritis, unspecified site: Secondary | ICD-10-CM | POA: Diagnosis not present

## 2019-09-30 DIAGNOSIS — R2681 Unsteadiness on feet: Secondary | ICD-10-CM | POA: Insufficient documentation

## 2019-09-30 DIAGNOSIS — Z85828 Personal history of other malignant neoplasm of skin: Secondary | ICD-10-CM | POA: Diagnosis not present

## 2019-09-30 DIAGNOSIS — F329 Major depressive disorder, single episode, unspecified: Secondary | ICD-10-CM | POA: Diagnosis not present

## 2019-09-30 DIAGNOSIS — M8580 Other specified disorders of bone density and structure, unspecified site: Secondary | ICD-10-CM | POA: Insufficient documentation

## 2019-09-30 DIAGNOSIS — F419 Anxiety disorder, unspecified: Secondary | ICD-10-CM | POA: Diagnosis not present

## 2019-09-30 DIAGNOSIS — J8410 Pulmonary fibrosis, unspecified: Secondary | ICD-10-CM | POA: Insufficient documentation

## 2019-09-30 DIAGNOSIS — R29898 Other symptoms and signs involving the musculoskeletal system: Secondary | ICD-10-CM | POA: Diagnosis present

## 2019-09-30 DIAGNOSIS — Z7902 Long term (current) use of antithrombotics/antiplatelets: Secondary | ICD-10-CM | POA: Insufficient documentation

## 2019-09-30 DIAGNOSIS — I700 Atherosclerosis of aorta: Secondary | ICD-10-CM | POA: Insufficient documentation

## 2019-09-30 DIAGNOSIS — Z20828 Contact with and (suspected) exposure to other viral communicable diseases: Secondary | ICD-10-CM | POA: Diagnosis not present

## 2019-09-30 DIAGNOSIS — K219 Gastro-esophageal reflux disease without esophagitis: Secondary | ICD-10-CM | POA: Diagnosis not present

## 2019-09-30 DIAGNOSIS — R4781 Slurred speech: Secondary | ICD-10-CM | POA: Diagnosis not present

## 2019-09-30 DIAGNOSIS — Z7982 Long term (current) use of aspirin: Secondary | ICD-10-CM | POA: Diagnosis not present

## 2019-09-30 DIAGNOSIS — R29818 Other symptoms and signs involving the nervous system: Secondary | ICD-10-CM | POA: Diagnosis not present

## 2019-09-30 DIAGNOSIS — R9431 Abnormal electrocardiogram [ECG] [EKG]: Secondary | ICD-10-CM | POA: Insufficient documentation

## 2019-09-30 DIAGNOSIS — E785 Hyperlipidemia, unspecified: Secondary | ICD-10-CM | POA: Diagnosis not present

## 2019-09-30 DIAGNOSIS — I6782 Cerebral ischemia: Secondary | ICD-10-CM | POA: Insufficient documentation

## 2019-09-30 DIAGNOSIS — I10 Essential (primary) hypertension: Secondary | ICD-10-CM | POA: Diagnosis not present

## 2019-09-30 DIAGNOSIS — R200 Anesthesia of skin: Secondary | ICD-10-CM | POA: Diagnosis not present

## 2019-09-30 DIAGNOSIS — G9389 Other specified disorders of brain: Secondary | ICD-10-CM | POA: Diagnosis not present

## 2019-09-30 DIAGNOSIS — Z23 Encounter for immunization: Secondary | ICD-10-CM | POA: Insufficient documentation

## 2019-09-30 DIAGNOSIS — Z87891 Personal history of nicotine dependence: Secondary | ICD-10-CM | POA: Diagnosis not present

## 2019-09-30 DIAGNOSIS — M48061 Spinal stenosis, lumbar region without neurogenic claudication: Secondary | ICD-10-CM | POA: Insufficient documentation

## 2019-09-30 LAB — COMPREHENSIVE METABOLIC PANEL
ALT: 27 U/L (ref 0–44)
AST: 27 U/L (ref 15–41)
Albumin: 3.9 g/dL (ref 3.5–5.0)
Alkaline Phosphatase: 55 U/L (ref 38–126)
Anion gap: 10 (ref 5–15)
BUN: 15 mg/dL (ref 8–23)
CO2: 24 mmol/L (ref 22–32)
Calcium: 8.9 mg/dL (ref 8.9–10.3)
Chloride: 106 mmol/L (ref 98–111)
Creatinine, Ser: 1.04 mg/dL — ABNORMAL HIGH (ref 0.44–1.00)
GFR calc Af Amer: 59 mL/min — ABNORMAL LOW (ref >60)
GFR calc non Af Amer: 51 mL/min — ABNORMAL LOW (ref >60)
Glucose, Bld: 99 mg/dL (ref 70–99)
Potassium: 3.9 mmol/L (ref 3.5–5.1)
Sodium: 140 mmol/L (ref 135–145)
Total Bilirubin: 0.5 mg/dL (ref 0.3–1.2)
Total Protein: 7.2 g/dL (ref 6.5–8.1)

## 2019-09-30 LAB — DIFFERENTIAL
Abs Immature Granulocytes: 0.02 10*3/uL (ref 0.00–0.07)
Basophils Absolute: 0 10*3/uL (ref 0.0–0.1)
Basophils Relative: 1 %
Eosinophils Absolute: 0.3 10*3/uL (ref 0.0–0.5)
Eosinophils Relative: 5 %
Immature Granulocytes: 0 %
Lymphocytes Relative: 16 %
Lymphs Abs: 1.1 10*3/uL (ref 0.7–4.0)
Monocytes Absolute: 0.5 10*3/uL (ref 0.1–1.0)
Monocytes Relative: 7 %
Neutro Abs: 4.8 10*3/uL (ref 1.7–7.7)
Neutrophils Relative %: 71 %

## 2019-09-30 LAB — PROTIME-INR
INR: 1 (ref 0.8–1.2)
Prothrombin Time: 12.9 seconds (ref 11.4–15.2)

## 2019-09-30 LAB — CBC
HCT: 32.6 % — ABNORMAL LOW (ref 36.0–46.0)
Hemoglobin: 10.5 g/dL — ABNORMAL LOW (ref 12.0–15.0)
MCH: 28.4 pg (ref 26.0–34.0)
MCHC: 32.2 g/dL (ref 30.0–36.0)
MCV: 88.1 fL (ref 80.0–100.0)
Platelets: 233 10*3/uL (ref 150–400)
RBC: 3.7 MIL/uL — ABNORMAL LOW (ref 3.87–5.11)
RDW: 14.3 % (ref 11.5–15.5)
WBC: 6.8 10*3/uL (ref 4.0–10.5)
nRBC: 0 % (ref 0.0–0.2)

## 2019-09-30 LAB — APTT: aPTT: 32 seconds (ref 24–36)

## 2019-09-30 LAB — SARS CORONAVIRUS 2 (TAT 6-24 HRS): SARS Coronavirus 2: NEGATIVE

## 2019-09-30 LAB — GLUCOSE, CAPILLARY: Glucose-Capillary: 98 mg/dL (ref 70–99)

## 2019-09-30 MED ORDER — DULOXETINE HCL 60 MG PO CPEP
60.0000 mg | ORAL_CAPSULE | Freq: Every day | ORAL | Status: DC
  Administered 2019-10-01: 10:00:00 60 mg via ORAL
  Filled 2019-09-30 (×2): qty 1

## 2019-09-30 MED ORDER — BUPROPION HCL ER (XL) 150 MG PO TB24
300.0000 mg | ORAL_TABLET | Freq: Every day | ORAL | Status: DC
  Administered 2019-10-01: 10:00:00 300 mg via ORAL
  Filled 2019-09-30: qty 2

## 2019-09-30 MED ORDER — SODIUM CHLORIDE 0.9% FLUSH
3.0000 mL | Freq: Once | INTRAVENOUS | Status: AC
Start: 2019-09-30 — End: 2019-09-30
  Administered 2019-09-30: 3 mL via INTRAVENOUS

## 2019-09-30 MED ORDER — STROKE: EARLY STAGES OF RECOVERY BOOK
Freq: Once | Status: AC
  Administered 2019-09-30: 15:00:00

## 2019-09-30 MED ORDER — ATORVASTATIN CALCIUM 80 MG PO TABS
80.0000 mg | ORAL_TABLET | Freq: Every day | ORAL | Status: DC
  Administered 2019-09-30: 80 mg via ORAL
  Filled 2019-09-30: qty 4
  Filled 2019-09-30 (×2): qty 1

## 2019-09-30 MED ORDER — TIOTROPIUM BROMIDE MONOHYDRATE 18 MCG IN CAPS
18.0000 ug | ORAL_CAPSULE | Freq: Every day | RESPIRATORY_TRACT | Status: DC
  Administered 2019-10-01: 18 ug via RESPIRATORY_TRACT
  Filled 2019-09-30: qty 5

## 2019-09-30 MED ORDER — ACETAMINOPHEN 650 MG RE SUPP: 650.0000 mg | RECTAL | Status: DC | PRN

## 2019-09-30 MED ORDER — ENOXAPARIN SODIUM 40 MG/0.4ML ~~LOC~~ SOLN
40.0000 mg | SUBCUTANEOUS | Status: DC
  Administered 2019-09-30: 40 mg via SUBCUTANEOUS
  Filled 2019-09-30: qty 0.4

## 2019-09-30 MED ORDER — PREGABALIN 50 MG PO CAPS
50.0000 mg | ORAL_CAPSULE | Freq: Three times a day (TID) | ORAL | Status: DC
  Administered 2019-09-30 – 2019-10-01 (×3): 50 mg via ORAL
  Filled 2019-09-30 (×3): qty 1

## 2019-09-30 MED ORDER — CLOPIDOGREL BISULFATE 75 MG PO TABS
75.0000 mg | ORAL_TABLET | Freq: Every day | ORAL | Status: DC
  Administered 2019-10-01: 75 mg via ORAL
  Filled 2019-09-30: qty 1

## 2019-09-30 MED ORDER — PNEUMOCOCCAL VAC POLYVALENT 25 MCG/0.5ML IJ INJ
0.5000 mL | INJECTION | INTRAMUSCULAR | Status: AC
  Administered 2019-10-01: 0.5 mL via INTRAMUSCULAR
  Filled 2019-09-30: qty 0.5

## 2019-09-30 MED ORDER — FAMOTIDINE 20 MG PO TABS
20.0000 mg | ORAL_TABLET | Freq: Every day | ORAL | Status: DC
  Administered 2019-09-30: 20 mg via ORAL
  Filled 2019-09-30: qty 1

## 2019-09-30 MED ORDER — ACETAMINOPHEN 160 MG/5ML PO SOLN
650.0000 mg | ORAL | Status: DC | PRN
  Filled 2019-09-30: qty 20.3

## 2019-09-30 MED ORDER — ACETAMINOPHEN 325 MG PO TABS: 650.0000 mg | ORAL_TABLET | ORAL | Status: DC | PRN

## 2019-09-30 MED ORDER — FLUTICASONE PROPIONATE 50 MCG/ACT NA SUSP
1.0000 | Freq: Every day | NASAL | Status: DC | PRN
  Filled 2019-09-30: qty 16

## 2019-09-30 NOTE — ED Notes (Signed)
Pt in MRI.

## 2019-09-30 NOTE — ED Triage Notes (Signed)
Pt to ED via POV c/o right leg numbness. Pt states that she woke up this morning around 0700 and her right leg was numb. Last known normal was last night around 2300. Pt reports that she is also having some numbness in her right 4th and 5th digits. Pt does have some slurred speech but she is unsure if this is her baseline from previous CVA. Pt does not have facial droop at this time. Pt reports dizziness 2 days ago. Pt appears pale on assessment.

## 2019-09-30 NOTE — ED Notes (Signed)
Report given to Yakana, RN.  

## 2019-09-30 NOTE — H&P (Signed)
[facility] NAME: Melody Ochoa    MR#:  [id number]  DATE OF BIRTH:  1941-03-12  DATE OF ADMISSION:  09/30/2019  PRIMARY CARE PHYSICIAN: Burnard Hawthorne, FNP   REQUESTING/REFERRING PHYSICIAN: Dr. Marjean Tatianna  CHIEF COMPLAINT: Right leg weakness, numbness   Chief Complaint  Patient presents with  . Numbness    HISTORY OF PRESENT ILLNESS:  Melody Ochoa  is a 78 y.o. female with a known history of previous stroke comes in because of right leg weakness, numbness that happened this morning at 7 AM and symptoms persisted for 20 minutes and then resolved after that.  Patient also noticed numbness in right side ring finger and little finger at the same time and symptoms resolved by the time she came to ER.  Patient symptoms completely resolved now.  She was admitted in July for stroke.  Patient had history of previous stroke in 2018 status post TPA.  Initial CT head at this time did not show acute changes and patient initial NIH stroke scale of 0 by the time she came to ER.  Spoke to Dr. Doy Mince who suggested admission overnight. PAST MEDICAL HISTORY:   Past Medical History:  Diagnosis Date  . Arthritis   . Atheromatous plaque 10/18/2017  . Back pain   . Cancer ([place])    skin  . Depression   . Diverticulosis   . Dizziness    patient had episode of dizziness when came in room. hx past no dx  . GERD (gastroesophageal reflux disease)   . Heart murmur   . Hyperlipidemia 10/18/2017  . PONV (postoperative nausea and vomiting)    yrs ago  . Stroke [facility])     PAST SURGICAL HISTOIRY:   Past Surgical History:  Procedure Laterality Date  . BACK SURGERY  13  . BREAST BIOPSY Bilateral    cores "years ago"  . BREAST EXCISIONAL BIOPSY Left 90's  . CATARACT EXTRACTION W/PHACO Left 12/20/2018   Procedure: CATARACT EXTRACTION PHACO AND INTRAOCULAR LENS PLACEMENT ([place]) LEFT TOPICAL;  Surgeon: Leandrew Koyanagi, MD;  Location:  [place];  Service: Ophthalmology;  Laterality: Left;  . CATARACT EXTRACTION W/PHACO Right 01/24/2019   Procedure: CATARACT EXTRACTION PHACO AND INTRAOCULAR LENS PLACEMENT ([place]) RIGHT;  Surgeon: Leandrew Koyanagi, MD;  Location: [place];  Service: Ophthalmology;  Laterality: Right;  . CERVICAL DISC SURGERY  09  . COLONOSCOPY WITH PROPOFOL N/A 01/13/2016   Procedure: COLONOSCOPY WITH PROPOFOL;  Surgeon: [name], MD;  Location: [facility] ENDOSCOPY;  Service: Endoscopy;  Laterality: N/A;  . FACELIFT  94  . LOOP RECORDER INSERTION N/A 08/23/2017   Procedure: LOOP RECORDER INSERTION;  Surgeon: Thompson Grayer, MD;  Location: [place] CV [facility];  Service: Cardiovascular;  Laterality: N/A;  . TEE WITHOUT CARDIOVERSION N/A 08/12/2017   Procedure: TRANSESOPHAGEAL ECHOCARDIOGRAM (TEE);  Surgeon: Sueanne Margarita, MD;  Location: [facility] ENDOSCOPY;  Service: Cardiovascular;  Laterality: N/A;  . TONSILLECTOMY    . TOTAL HIP ARTHROPLASTY Right 04/22/2016   Procedure: TOTAL HIP ARTHROPLASTY;  Surgeon: Corky [facility], MD;  Location: [facility] ORS;  Service: Orthopedics;  Laterality: Right;    SOCIAL HISTORY:   Social History   Tobacco Use  . Smoking status: Former Smoker    Packs/day: 1.00    Years: 40.00    Pack years: 40.00    Types: Cigarettes    Quit date: 11/13/1996    Years since quitting: 22.8  . Smokeless tobacco: Never Used  Substance Use Topics  . Alcohol use: Yes    Comment: occ wine    FAMILY HISTORY:   Family History  Problem Relation Age of Onset  . Lung cancer Father   . Aneurysm Brother   . Stroke Brother   . Diabetes Maternal Grandmother   . Kidney disease Maternal Grandfather   . Cushing syndrome Paternal Grandmother   . Dementia Paternal Grandfather   . Breast cancer Maternal Aunt 52    DRUG ALLERGIES:   Allergies  Allergen Reactions  . Aleve [Naproxen Sodium] Other (See Comments)    Gi upset  . Aspirin Other (See Comments)    Stomach pain-aggravates  diverticulosis  . Celebrex [Celecoxib] Other (See Comments)    Dizziness   . Effexor [Venlafaxine] Other (See Comments)    Hot flashes   . Gabapentin Other (See Comments)    "Night terrors"  . Hydrocodone-Homatropine Diarrhea  . Ibuprofen Other (See Comments)    Gi upset   . Tape Itching and Other (See Comments)    Itchy blisters  . Vioxx [Rofecoxib] Other (See Comments)    Gi upset  . Other Rash and Other (See Comments)    bandaides  . Tramadol Other (See Comments)    hallucinations    REVIEW OF SYSTEMS:  CONSTITUTIONAL: No fever, fatigue or weakness.  EYES: No blurred or double vision.  EARS, NOSE, AND THROAT: No tinnitus or ear pain.  RESPIRATORY: No cough, shortness of breath, wheezing or hemoptysis.  CARDIOVASCULAR: No chest pain, orthopnea, edema.  GASTROINTESTINAL: No nausea, vomiting, diarrhea or abdominal pain.  GENITOURINARY: No dysuria, hematuria.  ENDOCRINE: No polyuria, nocturia,  HEMATOLOGY: No anemia, easy bruising or bleeding SKIN: No rash or lesion. MUSCULOSKELETAL: No joint pain or arthritis.   NEUROLOGIC: Right leg heaviness this morning , resolved now. PSYCHIATRY: No anxiety or depression.   MEDICATIONS AT HOME:   Prior to Admission medications   Medication Sig Start Date End Date Taking? Authorizing Provider  atorvastatin (LIPITOR) 80 MG tablet TAKE 1 TABLET (80 MG TOTAL) BY MOUTH DAILY AT 6 PM. 06/27/19  Yes Deboraha Sprang, MD  buPROPion (WELLBUTRIN XL) 300 MG 24 hr tablet Take 300 mg by mouth daily.  07/15/17  Yes [provider]  clopidogrel (PLAVIX) 75 MG tablet TAKE 1 TABLET BY MOUTH EVERY DAY 03/07/19  Yes Deboraha Sprang, MD  DULoxetine (CYMBALTA) 60 MG capsule Take 60 mg by mouth daily.    Yes [provider]  famotidine (PEPCID) 20 MG tablet Take 20 mg by mouth at bedtime.  11/07/18  Yes [provider]  pregabalin (LYRICA) 50 MG capsule Take 1 capsule (50 mg total) by mouth 3 (three) times daily. 08/16/19 11/14/19 Yes  Lateef, Carlus Pavlov, MD  albuterol (VENTOLIN HFA) 108 (90 Base) MCG/ACT inhaler TAKE 2 PUFFS BY MOUTH EVERY 6 HOURS AS NEEDED FOR WHEEZE OR SHORTNESS OF BREATH 09/04/19   Crecencio Mc, MD  amLODipine (NORVASC) 2.5 MG tablet Take 1 tablet (2.5 mg total) by mouth daily as needed. If blood pressure greater than 140/90. 06/27/19   Burnard Hawthorne, FNP  fluticasone (FLONASE) 50 MCG/ACT nasal spray Place 1 spray into both nostrils daily as needed for allergies or rhinitis (congestion).     [provider]  tiotropium (SPIRIVA) 18 MCG inhalation capsule Place 1 capsule (18 mcg total) into inhaler and inhale daily for 30 days. 01/17/19 06/13/19  Laverle Hobby, MD  triamcinolone cream (KENALOG) 0.1 % Apply 1 application topically 2 (two) times daily as  needed (rash/skin irritation.).  02/03/18   [provider]      VITAL SIGNS:  Blood pressure (!) 166/84, pulse 64, temperature 98.1 F (36.7 C), temperature source Oral, resp. rate 14, height 5\' 4"  (1.626 m), weight 90.7 kg, SpO2 97 %.  PHYSICAL EXAMINATION:  GENERAL:  78 y.o.-year-old patient lying in the bed with no acute distress.  EYES: Pupils equal, round, reactive to light and accommodation. No scleral icterus. Extraocular muscles intact.  HEENT: Head atraumatic, normocephalic. Oropharynx and nasopharynx clear.  NECK:  Supple, no jugular venous distention. No thyroid enlargement, no tenderness.  LUNGS: Normal breath sounds bilaterally, no wheezing, rales,rhonchi or crepitation. No use of accessory muscles of respiration.  CARDIOVASCULAR: S1, S2 normal. No murmurs, rubs, or gallops.  ABDOMEN: Soft, nontender, nondistended. Bowel sounds present. No organomegaly or mass.  EXTREMITIES: No pedal edema, cyanosis, or clubbing.  NEUROLOGIC: Cranial nerves II through XII are intact. Muscle strength 5/5 in all extremities. Sensation intact. Gait not checked.  PSYCHIATRIC: The patient is alert and oriented x 3.  SKIN: No obvious rash,  lesion, or ulcer.   LABORATORY PANEL:   CBC Recent Labs  Lab 09/30/19 0925  WBC 6.8  HGB 10.5*  HCT 32.6*  PLT 233   ------------------------------------------------------------------------------------------------------------------  Chemistries  Recent Labs  Lab 09/30/19 0925  NA 140  K 3.9  CL 106  CO2 24  GLUCOSE 99  BUN 15  CREATININE 1.04*  CALCIUM 8.9  AST 27  ALT 27  ALKPHOS 55  BILITOT 0.5   ------------------------------------------------------------------------------------------------------------------  Cardiac Enzymes No results for input(s): TROPONINI in the last 168 hours. ------------------------------------------------------------------------------------------------------------------  RADIOLOGY:  Ct Head Wo Contrast  Result Date: 09/30/2019 CLINICAL DATA:  Right leg numbness. Numbness in right fourth and fifth digits. Slurred speech. EXAM: CT HEAD WITHOUT CONTRAST TECHNIQUE: Contiguous axial images were obtained from the base of the skull through the vertex without intravenous contrast. COMPARISON:  06/13/2019 FINDINGS: Brain: No evidence of acute infarction, hemorrhage, hydrocephalus, extra-axial collection or mass lesion/mass effect. Right frontal encephalomalacia and left posterior parietal encephalomalacia identified. Chronic left cerebellar hemisphere infarct is also again noted. There is mild diffuse low-attenuation within the subcortical and periventricular white matter compatible with chronic microvascular disease. Prominence of the CSF spaces and ventricles compatible with brain atrophy. Vascular: No hyperdense vessel or unexpected calcification. Skull: Normal. Negative for fracture or focal lesion. Sinuses/Orbits: No acute finding. Other: None. IMPRESSION: 1. No acute intracranial abnormalities. 2. Chronic small vessel ischemic change and brain atrophy. 3. Chronic right frontal, left posterior parietal and left cerebellar hemisphere infarcts.  Electronically Signed   By: Kerby Moors M.D.   On: 09/30/2019 09:46    EKG:   Orders placed or performed during the hospital encounter of 09/30/19  . ED EKG  . ED EKG    IMPRESSION AND PLAN:   78 year old female with history of chronic pain, fibromyalgia, previous right frontal stroke in July now comes today with right leg weakness in the morning and resolved now. #1 transient right leg weakness, please evaluate for TIA,Decrescendo stroke; initial CT had unremarkable, is going for MRI of the brain, recently had ultrasound of carotids, echocardiogram so I will not repeat those test.  Continue, patient states that aspirin gave her some gastric discomfort.  So continue Plavix, statins.  Continue frequent neurochecks, stroke swallow screen.  previous stroke status post TPA in 2018, recent admission in July showing 2.5 cm acute infarct in the right posterior frontal cortex #2, depression: Continue Cymbalta.   #3.  History of pulmonary fibrosis, not symptomatic, follows up with Dr. Ashby Dawes, continue as needed inhalers. 4.  Hyperlipidemia, patient on high intensity statins. All the records are reviewed and case discussed with ED provider. Management plans discussed with the patient, family and they are in agreement.  CODE STATUS: Full code  TOTAL TIME TAKING CARE OF THIS PATIENT: 55 minutes.    Epifanio Lesches M.D on 09/30/2019 at 12:32 PM  Between 7am to 6pm - Pager - [phone]  After 6pm go to [email or web address] - password [email or web address]  [facility]  Office  [phone]  CC: Primary care physician; Burnard Hawthorne, FNP  Note: This dictation was prepared with Dragon dictation along with smaller phrase technology. Any transcriptional errors that result from this process are unintentional.

## 2019-09-30 NOTE — ED Provider Notes (Signed)
[facility] Emergency Department Provider Note  ____________________________________________   First MD Initiated Contact with Patient 09/30/19 [phone]     (approximate)  I have reviewed the triage vital signs and the nursing notes.   HISTORY  Chief Complaint Numbness    HPI Melody Ochoa is a 78 y.o. female with prior stroke who comes in with right leg numbness.  Patient went to bed last night at 11:00 and felt her normal self.  When she woke up this morning around 7 AM she had right leg total numbness and weakness.  She states that she attempted to walk to the bathroom but she could not due to the weakness.  Since then her symptoms resolved within 30 minutes.  However during that time they were constant, nothing made it better, nothing made them worse.  Patient has had prior CVAs and has got some slurred speech at baseline from it.  She denies any other new deficits.  She does endorse some intermittent tingling sensation in her right fourth and fifth digits as well.          Past Medical History:  Diagnosis Date   Arthritis    Atheromatous plaque 10/18/2017   Back pain    Cancer ([place])    skin   Depression    Diverticulosis    Dizziness    patient had episode of dizziness when came in room. hx past no dx   GERD (gastroesophageal reflux disease)    Heart murmur    Hyperlipidemia 10/18/2017   PONV (postoperative nausea and vomiting)    yrs ago   Stroke [facility])     Patient Active Problem List   Diagnosis Date Noted   Peripheral neuropathy 08/10/2019   Primary osteoarthritis of left shoulder 07/24/2019   Elevated blood pressure reading 06/27/2019   Left shoulder pain 05/04/2019   Anemia 03/19/2019   Long term current use of opiate analgesic 01/09/2019   Foraminal stenosis of lumbar region 11/14/2018   Chronic pain syndrome 09/12/2018   SOB (shortness of breath) on exertion 08/11/2018   Dysphagia 08/11/2018   Cough  06/30/2018   Rash 01/16/2018   Hyperlipidemia 10/18/2017   History of lumbar surgery 10/04/2017   Depression, recurrent ([place]) 09/27/2017   Pulmonary nodules 09/27/2017   History of stroke 09/12/2017   Stroke (cerebrum) ([place]) 08/10/2017   Primary osteoarthritis of left hip 02/28/2017   Strain of left hip 02/28/2017   B12 deficiency 12/21/2016   Generalized OA 05/13/2016   Status post total replacement of right hip 04/22/2016   Personal history of colonic polyps    Greater trochanteric bursitis of both hips 01/09/2016   Prediabetes 08/04/2015   Thoracic aortic atherosclerosis ([place]) 06/16/2015   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   UTI (lower urinary tract infection) 11/22/2014   Low back pain 11/22/2014   Spondylolisthesis of lumbar region 11/18/2014   Lumbar radiculopathy 08/21/2014   Degeneration of lumbar or lumbosacral intervertebral disc 08/21/2014   Left hip pain 08/21/2014   Osteopenia 06/23/2014   Major depression in remission ([place]) 06/23/2014   Anxiety 06/23/2014   Menopause 06/23/2014    Past Surgical History:  Procedure Laterality Date   BACK SURGERY  13   BREAST BIOPSY Bilateral    cores "years ago"   BREAST EXCISIONAL BIOPSY Left 90's   CATARACT EXTRACTION W/PHACO Left 12/20/2018   Procedure: CATARACT EXTRACTION PHACO AND INTRAOCULAR LENS PLACEMENT ([place]) LEFT TOPICAL;  Surgeon: Leandrew Koyanagi, MD;  Location: [place] [facility];  Service:  Ophthalmology;  Laterality: Left;   CATARACT EXTRACTION W/PHACO Right 01/24/2019   Procedure: CATARACT EXTRACTION PHACO AND INTRAOCULAR LENS PLACEMENT ([place]) RIGHT;  Surgeon: Leandrew Koyanagi, MD;  Location: [place];  Service: Ophthalmology;  Laterality: Right;   CERVICAL DISC SURGERY  09   COLONOSCOPY WITH PROPOFOL N/A 01/13/2016   Procedure: COLONOSCOPY WITH PROPOFOL;  Surgeon: [name], MD;  Location: [facility] ENDOSCOPY;  Service: Endoscopy;  Laterality: N/A;   FACELIFT   94   LOOP RECORDER INSERTION N/A 08/23/2017   Procedure: LOOP RECORDER INSERTION;  Surgeon: Thompson Grayer, MD;  Location: [facility] [facility];  Service: Cardiovascular;  Laterality: N/A;   TEE WITHOUT CARDIOVERSION N/A 08/12/2017   Procedure: TRANSESOPHAGEAL ECHOCARDIOGRAM (TEE);  Surgeon: Sueanne Margarita, MD;  Location: [facility] ENDOSCOPY;  Service: Cardiovascular;  Laterality: N/A;   TONSILLECTOMY     TOTAL HIP ARTHROPLASTY Right 04/22/2016   Procedure: TOTAL HIP ARTHROPLASTY;  Surgeon: Corky Mull, MD;  Location: [facility] ORS;  Service: Orthopedics;  Laterality: Right;    Prior to Admission medications   Medication Sig Start Date End Date Taking? Authorizing Provider  albuterol (VENTOLIN HFA) 108 (90 Base) MCG/ACT inhaler TAKE 2 PUFFS BY MOUTH EVERY 6 HOURS AS NEEDED FOR WHEEZE OR SHORTNESS OF BREATH 09/04/19   Crecencio Mc, MD  amLODipine (NORVASC) 2.5 MG tablet Take 1 tablet (2.5 mg total) by mouth daily as needed. If blood pressure greater than 140/90. 06/27/19   Burnard Hawthorne, FNP  atorvastatin (LIPITOR) 80 MG tablet TAKE 1 TABLET (80 MG TOTAL) BY MOUTH DAILY AT 6 PM. 06/27/19   Deboraha Sprang, MD  buPROPion (WELLBUTRIN XL) 300 MG 24 hr tablet Take 300 mg by mouth daily.  07/15/17   [provider]  clopidogrel (PLAVIX) 75 MG tablet TAKE 1 TABLET BY MOUTH EVERY DAY 03/07/19   Deboraha Sprang, MD  DULoxetine (CYMBALTA) 30 MG capsule Take 30 mg by mouth daily. 08/16/19   [provider]  DULoxetine (CYMBALTA) 60 MG capsule Take 30 mg by mouth daily.     [provider]  famotidine (PEPCID) 20 MG tablet Take 20 mg by mouth at bedtime.  11/07/18   [provider]  fluticasone (FLONASE) 50 MCG/ACT nasal spray Place 1 spray into both nostrils daily as needed for allergies or rhinitis (congestion).     [provider]  pregabalin (LYRICA) 50 MG capsule Take 1 capsule (50 mg total) by mouth 3 (three) times daily. 08/16/19 11/14/19  Gillis Santa, MD  tiotropium  (SPIRIVA) 18 MCG inhalation capsule Place 1 capsule (18 mcg total) into inhaler and inhale daily for 30 days. 01/17/19 06/13/19  Laverle Hobby, MD  triamcinolone cream (KENALOG) 0.1 % Apply 1 application topically 2 (two) times daily as needed (rash/skin irritation.).  02/03/18   [provider]    Allergies Aleve [naproxen sodium], Aspirin, Celebrex [celecoxib], Effexor [venlafaxine], Gabapentin, Hydrocodone-homatropine, Ibuprofen, Tape, Vioxx [rofecoxib], Other, and Tramadol  Family History  Problem Relation Age of Onset   Lung cancer Father    Aneurysm Brother    Stroke Brother    Diabetes Maternal Grandmother    Kidney disease Maternal Grandfather    Cushing syndrome Paternal Grandmother    Dementia Paternal Grandfather    Breast cancer Maternal Aunt 52    Social History Social History   Tobacco Use   Smoking status: Former Smoker    Packs/day: 1.00    Years: 40.00    Pack years: 40.00    Types: Cigarettes  Quit date: 11/13/1996    Years since quitting: 22.8   Smokeless tobacco: Never Used  Substance Use Topics   Alcohol use: Yes    Comment: occ wine   Drug use: No      Review of Systems Constitutional: No fever/chills Eyes: No visual changes. ENT: No sore throat. Cardiovascular: Denies chest pain. Respiratory: Denies shortness of breath. Gastrointestinal: No abdominal pain.  No nausea, no vomiting.  No diarrhea.  No constipation. Genitourinary: Negative for dysuria. Musculoskeletal: Negative for back pain. Skin: Negative for rash. Neurological: Negative for headaches, right leg weakness and numbness. All other ROS negative ____________________________________________   PHYSICAL EXAM:  VITAL SIGNS: ED Triage Vitals  Enc Vitals Group     BP 09/30/19 0852 (!) 142/63     Pulse Rate 09/30/19 0852 79     Resp 09/30/19 0852 16     Temp 09/30/19 0852 98.1 F (36.7 C)     Temp Source 09/30/19 0852 Oral     SpO2 09/30/19 0852 97 %       Weight 09/30/19 0853 200 lb (90.7 kg)     Height 09/30/19 0853 5\' 4"  (1.626 m)     Head Circumference --      Peak Flow --      Pain Score 09/30/19 0853 0     Pain Loc --      Pain Edu? --      Excl. in [place]? --     Constitutional: Alert and oriented. Well appearing and in no acute distress. Eyes: Conjunctivae are normal. EOMI. Head: Atraumatic. Nose: No congestion/rhinnorhea. Mouth/Throat: Mucous membranes are moist.   Neck: No stridor. Trachea Midline. FROM Cardiovascular: Normal rate, regular rhythm. Grossly normal heart sounds.  Good peripheral circulation. Respiratory: Normal respiratory effort.  No retractions. Lungs CTAB. Gastrointestinal: Soft and nontender. No distention. No abdominal bruits.  Musculoskeletal: No lower extremity tenderness nor edema.  No joint effusions. Neurologic: Baseline hoarse voice.  Cranial nerves II through XII are intact.  Equal strength in her arms and her legs. Skin:  Skin is warm, dry and intact. No rash noted. Psychiatric: Mood and affect are normal. Speech and behavior are normal. GU: Deferred   ____________________________________________   LABS (all labs ordered are listed, but only abnormal results are displayed)  Labs Reviewed  CBC - Abnormal; Notable for the following components:      Result Value   RBC 3.70 (*)    Hemoglobin 10.5 (*)    HCT 32.6 (*)    All other components within normal limits  COMPREHENSIVE METABOLIC PANEL - Abnormal; Notable for the following components:   Creatinine, Ser 1.04 (*)    GFR calc non Af Amer 51 (*)    GFR calc Af Amer 59 (*)    All other components within normal limits  PROTIME-INR  APTT  DIFFERENTIAL  GLUCOSE, CAPILLARY  CBG MONITORING, ED   ____________________________________________   ED ECG REPORT I, Vanessa [place], the attending physician, personally viewed and interpreted this ECG.  EKG is normal sinus rate of 75, no ST elevations, no T wave inversions, normal  intervals ____________________________________________  RADIOLOGY   Official radiology report(s): Ct Head Wo Contrast  Result Date: 09/30/2019 CLINICAL DATA:  Right leg numbness. Numbness in right fourth and fifth digits. Slurred speech. EXAM: CT HEAD WITHOUT CONTRAST TECHNIQUE: Contiguous axial images were obtained from the base of the skull through the vertex without intravenous contrast. COMPARISON:  06/13/2019 FINDINGS: Brain: No evidence of acute infarction, hemorrhage, hydrocephalus, extra-axial  collection or mass lesion/mass effect. Right frontal encephalomalacia and left posterior parietal encephalomalacia identified. Chronic left cerebellar hemisphere infarct is also again noted. There is mild diffuse low-attenuation within the subcortical and periventricular white matter compatible with chronic microvascular disease. Prominence of the CSF spaces and ventricles compatible with brain atrophy. Vascular: No hyperdense vessel or unexpected calcification. Skull: Normal. Negative for fracture or focal lesion. Sinuses/Orbits: No acute finding. Other: None. IMPRESSION: 1. No acute intracranial abnormalities. 2. Chronic small vessel ischemic change and brain atrophy. 3. Chronic right frontal, left posterior parietal and left cerebellar hemisphere infarcts. Electronically Signed   By: Kerby Moors M.D.   On: 09/30/2019 09:46    ____________________________________________   PROCEDURES  Procedure(s) performed (including Critical Care):  Procedures   ____________________________________________   INITIAL IMPRESSION / ASSESSMENT AND PLAN / ED COURSE  Melody Ochoa was evaluated in Emergency Department on 09/30/2019 for the symptoms described in the history of present illness. She was evaluated in the context of the global COVID-19 pandemic, which necessitated consideration that the patient might be at risk for infection with the SARS-CoV-2 virus that causes COVID-19. Institutional  protocols and algorithms that pertain to the evaluation of patients at risk for COVID-19 are in a state of rapid change based on information released by regulatory bodies including the CDC and federal and state organizations. These policies and algorithms were followed during the patient's care in the ED.    Patient is a 78 year old who presented with last normal of 11 PM on 10/24 with 30 minutes of right-sided leg weakness and numbness that has since resolved.  This most likely consistent with a TIA.  Will get CT head to evaluate for intracranial hemorrhage versus mass.  Will get labs to evaluate for electrolyte abnormalities, AKI.  Patient was admitted on 06/13/2019 for aphasia and stroke.  MRI did confirm the stroke.  Patient had carotid Dopplers that were negative.  Echocardiogram was negative.  Patient was discharged on Plavix due to aspirin allergy.  MRI at that time showed right posterior frontal cortical and subcortical infarct.   Labs reviewed and are at baseline.  CT scan shows old strokes.  Discussed with Dr. Doy Mince given patient had recent work-up for strokes and outpatient is back in the window for TPA if she was to have a full stroke therefore he is a recommended admission for observation, telemetry monitoring, neuro checks.  We will discuss with hospital team for admission.  ________________________________________   FINAL CLINICAL IMPRESSION(S) / ED DIAGNOSES   Final diagnoses:  TIA (transient ischemic attack)      MEDICATIONS GIVEN DURING THIS VISIT:  Medications  sodium chloride flush (NS) 0.9 % injection 3 mL (3 mLs Intravenous Given 09/30/19 0936)     ED Discharge Orders    None       Note:  This document was prepared using Dragon voice recognition software and may include unintentional dictation errors.   Vanessa [place], MD 09/30/19 1045

## 2019-09-30 NOTE — Consult Note (Addendum)
Referring Physician: Nicholes Mango    Chief Complaint: Right leg numbness  HPI: Melody Ochoa is an 78 y.o. female with a history of stroke, last in July of this year, who presents after awakening with right leg numbness.  Patient reports that she went to bed last evening at baseline.  She awakened today and noted that her right leg was numb.  When she attempted to stand her right leg would not hold her.  By the time she was seen in the ED her symptoms had resolved.  While getting her MRI she had tingling in the last two fingers of her right hand that spread to involve her entire right hand.  This has resolved as of this time as well.  Initial NIHSS of 0.     Date last known well: Date: 09/29/2019 Time last known well: Time: 23:00 tPA Given: No: Resolution of symptoms  Past Medical History:  Diagnosis Date  . Arthritis   . Atheromatous plaque 10/18/2017  . Back pain   . Cancer ([place])    skin  . Depression   . Diverticulosis   . Dizziness    patient had episode of dizziness when came in room. hx past no dx  . GERD (gastroesophageal reflux disease)   . Heart murmur   . Hyperlipidemia 10/18/2017  . PONV (postoperative nausea and vomiting)    yrs ago  . Stroke [facility])     Past Surgical History:  Procedure Laterality Date  . BACK SURGERY  13  . BREAST BIOPSY Bilateral    cores "years ago"  . BREAST EXCISIONAL BIOPSY Left 90's  . CATARACT EXTRACTION W/PHACO Left 12/20/2018   Procedure: CATARACT EXTRACTION PHACO AND INTRAOCULAR LENS PLACEMENT ([place]) LEFT TOPICAL;  Surgeon: Leandrew Koyanagi, MD;  Location: [place];  Service: Ophthalmology;  Laterality: Left;  . CATARACT EXTRACTION W/PHACO Right 01/24/2019   Procedure: CATARACT EXTRACTION PHACO AND INTRAOCULAR LENS PLACEMENT ([place]) RIGHT;  Surgeon: Leandrew Koyanagi, MD;  Location: [place];  Service: Ophthalmology;  Laterality: Right;  . CERVICAL DISC SURGERY  09  . COLONOSCOPY WITH PROPOFOL N/A 01/13/2016    Procedure: COLONOSCOPY WITH PROPOFOL;  Surgeon: [name], MD;  Location: [facility] ENDOSCOPY;  Service: Endoscopy;  Laterality: N/A;  . FACELIFT  94  . LOOP RECORDER INSERTION N/A 08/23/2017   Procedure: LOOP RECORDER INSERTION;  Surgeon: Thompson Grayer, MD;  Location: [place] CV LAB;  Service: Cardiovascular;  Laterality: N/A;  . TEE WITHOUT CARDIOVERSION N/A 08/12/2017   Procedure: TRANSESOPHAGEAL ECHOCARDIOGRAM (TEE);  Surgeon: Sueanne Margarita, MD;  Location[facility] ENDOSCOPY;  Service: Cardiovascular;  Laterality: N/A;  . TONSILLECTOMY    . TOTAL HIP ARTHROPLASTY Right 04/22/2016   Procedure: TOTAL HIP ARTHROPLASTY;  Surgeon: Corky Mull, MD;  Location: [facility] ORS;  Service: Orthopedics;  Laterality: Right;    Family History  Problem Relation Age of Onset  . Lung cancer Father   . Aneurysm Brother   . Stroke Brother   . Diabetes Maternal Grandmother   . Kidney disease Maternal Grandfather   . Cushing syndrome Paternal Grandmother   . Dementia Paternal Grandfather   . Breast cancer Maternal Aunt 52   Social History:  reports that she quit smoking about 22 years ago. Her smoking use included cigarettes. She has a 40.00 pack-year smoking history. She has never used smokeless tobacco. She reports current alcohol use. She reports that she does not use drugs.  Allergies:  Allergies  Allergen Reactions  . Aleve [Naproxen Sodium] Other (See  Comments)    Gi upset  . Aspirin Other (See Comments)    Stomach pain-aggravates diverticulosis  . Celebrex [Celecoxib] Other (See Comments)    Dizziness   . Effexor [Venlafaxine] Other (See Comments)    Hot flashes   . Gabapentin Other (See Comments)    "Night terrors"  . Hydrocodone-Homatropine Diarrhea  . Ibuprofen Other (See Comments)    Gi upset   . Tape Itching and Other (See Comments)    Itchy blisters  . Vioxx [Rofecoxib] Other (See Comments)    Gi upset  . Other Rash and Other (See Comments)    bandaides  . Tramadol Other (See  Comments)    hallucinations    Medications:  I have reviewed the patient's current medications. Prior to Admission:  Medications Prior to Admission  Medication Sig Dispense Refill Last Dose  . atorvastatin (LIPITOR) 80 MG tablet TAKE 1 TABLET (80 MG TOTAL) BY MOUTH DAILY AT 6 PM. 90 tablet 1 09/29/2019 at 1800  . buPROPion (WELLBUTRIN XL) 300 MG 24 hr tablet Take 300 mg by mouth daily.    09/29/2019 at 0900  . clopidogrel (PLAVIX) 75 MG tablet TAKE 1 TABLET BY MOUTH EVERY DAY 30 tablet 11 09/29/2019 at 0900  . DULoxetine (CYMBALTA) 60 MG capsule Take 60 mg by mouth daily.    09/29/2019 at 0900  . famotidine (PEPCID) 20 MG tablet Take 20 mg by mouth at bedtime.   12 09/29/2019 at 2000  . pregabalin (LYRICA) 50 MG capsule Take 1 capsule (50 mg total) by mouth 3 (three) times daily. 90 capsule 2 09/29/2019 at 2000  . albuterol (VENTOLIN HFA) 108 (90 Base) MCG/ACT inhaler TAKE 2 PUFFS BY MOUTH EVERY 6 HOURS AS NEEDED FOR WHEEZE OR SHORTNESS OF BREATH 18 g 2 prn at prn  . amLODipine (NORVASC) 2.5 MG tablet Take 1 tablet (2.5 mg total) by mouth daily as needed. If blood pressure greater than 140/90. 90 tablet 3 prn at prn  . fluticasone (FLONASE) 50 MCG/ACT nasal spray Place 1 spray into both nostrils daily as needed for allergies or rhinitis (congestion).    prn at prn  . tiotropium (SPIRIVA) 18 MCG inhalation capsule Place 1 capsule (18 mcg total) into inhaler and inhale daily for 30 days. 30 capsule 12   . triamcinolone cream (KENALOG) 0.1 % Apply 1 application topically 2 (two) times daily as needed (rash/skin irritation.).   0 prn at prn   Scheduled: .  stroke: mapping our early stages of recovery book   Does not apply Once  . atorvastatin  80 mg Oral q1800  . buPROPion  300 mg Oral Daily  . clopidogrel  75 mg Oral Daily  . DULoxetine  60 mg Oral Daily  . enoxaparin (LOVENOX) injection  40 mg Subcutaneous Q24H  . famotidine  20 mg Oral QHS  . pregabalin  50 mg Oral TID  . tiotropium  18  mcg Inhalation Daily    ROS: History obtained from the patient  General ROS: negative for - chills, fatigue, fever, night sweats, weight gain or weight loss Psychological ROS: memory difficulties Ophthalmic ROS: negative for - blurry vision, double vision, eye pain or loss of vision ENT ROS: negative for - epistaxis, nasal discharge, oral lesions, sore throat, tinnitus or vertigo Allergy and Immunology ROS: negative for - hives or itchy/watery eyes Hematological and Lymphatic ROS: negative for - bleeding problems, bruising or swollen lymph nodes Endocrine ROS: negative for - galactorrhea, hair pattern changes, polydipsia/polyuria or temperature intolerance  Respiratory ROS: negative for - cough, hemoptysis, shortness of breath or wheezing Cardiovascular ROS: negative for - chest pain, dyspnea on exertion, edema or irregular heartbeat Gastrointestinal ROS: negative for - abdominal pain, diarrhea, hematemesis, nausea/vomiting or stool incontinence Genito-Urinary ROS: negative for - dysuria, hematuria, incontinence or urinary frequency/urgency Musculoskeletal ROS: negative for - joint swelling or muscular weakness Neurological ROS: as noted in HPI, bilateral foot numbness Dermatological ROS: negative for rash and skin lesion changes  Physical Examination: Blood pressure (!) 156/77, pulse 69, temperature 97.9 F (36.6 C), temperature source Oral, resp. rate 16, height 5\' 4"  (1.626 m), weight 90.7 kg, SpO2 (!) 80 %.  HEENT-  Normocephalic, no lesions, without obvious abnormality.  Normal external eye and conjunctiva.  Normal TM's bilaterally.  Normal auditory canals and external ears. Normal external nose, mucus membranes and septum.  Normal pharynx. Cardiovascular- S1, S2 normal, pulses palpable throughout   Lungs- chest clear, no wheezing, rales, normal symmetric air entry Abdomen- soft, non-tender; bowel sounds normal; no masses,  no organomegaly Extremities- no edema Lymph-no adenopathy  palpable Musculoskeletal-no joint tenderness, deformity or swelling Skin-warm and dry, no hyperpigmentation, vitiligo, or suspicious lesions  Neurological Examination   Mental Status: Alert, oriented, thought content appropriate.  Speech fluent without evidence of aphasia.  Able to follow 3 step commands without difficulty. Cranial Nerves: II: Discs flat bilaterally; Visual fields grossly normal, pupils equal, round, reactive to light and accommodation III,IV, VI: ptosis not present, extra-ocular motions intact bilaterally V,VII: smile symmetric, facial light touch sensation normal bilaterally VIII: hearing normal bilaterally IX,X: gag reflex present XI: bilateral shoulder shrug XII: midline tongue extension Motor: Right : Upper extremity   5/5    Left:     Upper extremity   5/5  Lower extremity   5/5     Lower extremity   5/5 Tone and bulk:normal tone throughout; no atrophy noted Sensory: Pinprick and light touch intact throughout, bilaterally Deep Tendon Reflexes: Symmetric throughout Plantars: Right: mute   Left: mute Cerebellar: Normal finger-to-nose and normal heel-to-shin testing bilaterally Gait: not tested due to safety concerns    Laboratory Studies:  Basic Metabolic Panel: Recent Labs  Lab 09/30/19 0925  NA 140  K 3.9  CL 106  CO2 24  GLUCOSE 99  BUN 15  CREATININE 1.04*  CALCIUM 8.9    Liver Function Tests: Recent Labs  Lab 09/30/19 0925  AST 27  ALT 27  ALKPHOS 55  BILITOT 0.5  PROT 7.2  ALBUMIN 3.9   No results for input(s): LIPASE, AMYLASE in the last 168 hours. No results for input(s): AMMONIA in the last 168 hours.  CBC: Recent Labs  Lab 09/30/19 0925  WBC 6.8  NEUTROABS 4.8  HGB 10.5*  HCT 32.6*  MCV 88.1  PLT 233    Cardiac Enzymes: No results for input(s): CKTOTAL, CKMB, CKMBINDEX, TROPONINI in the last 168 hours.  BNP: Invalid input(s): POCBNP  CBG: Recent Labs  Lab 09/30/19 0922  GLUCAP 80    Microbiology: Results  for orders placed or performed during the hospital encounter of 06/13/19  SARS Coronavirus 2 (CEPHEID- Performed in [place] hospital lab), Hosp Order     Status: None   Collection Time: 06/13/19 11:07 AM   Specimen: Nasopharyngeal Swab  Result Value Ref Range Status   SARS Coronavirus 2 NEGATIVE NEGATIVE Final    Comment: (NOTE) If result is NEGATIVE SARS-CoV-2 target nucleic acids are NOT DETECTED. The SARS-CoV-2 RNA is generally detectable in upper and lower  respiratory specimens  during the acute phase of infection. The lowest  concentration of SARS-CoV-2 viral copies this assay can detect is 250  copies / mL. A negative result does not preclude SARS-CoV-2 infection  and should not be used as the sole basis for treatment or other  patient management decisions.  A negative result may occur with  improper specimen collection / handling, submission of specimen other  than nasopharyngeal swab, presence of viral mutation(s) within the  areas targeted by this assay, and inadequate number of viral copies  (<250 copies / mL). A negative result must be combined with clinical  observations, patient history, and epidemiological information. If result is POSITIVE SARS-CoV-2 target nucleic acids are DETECTED. The SARS-CoV-2 RNA is generally detectable in upper and lower  respiratory specimens dur ing the acute phase of infection.  Positive  results are indicative of active infection with SARS-CoV-2.  Clinical  correlation with patient history and other diagnostic information is  necessary to determine patient infection status.  Positive results do  not rule out bacterial infection or co-infection with other viruses. If result is PRESUMPTIVE POSTIVE SARS-CoV-2 nucleic acids MAY BE PRESENT.   A presumptive positive result was obtained on the submitted specimen  and confirmed on repeat testing.  While 2019 novel coronavirus  (SARS-CoV-2) nucleic acids may be present in the submitted sample   additional confirmatory testing may be necessary for epidemiological  and / or clinical management purposes  to differentiate between  SARS-CoV-2 and other Sarbecovirus currently known to infect humans.  If clinically indicated additional testing with an alternate test  methodology [phone]) is advised. The SARS-CoV-2 RNA is generally  detectable in upper and lower respiratory sp ecimens during the acute  phase of infection. The expected result is Negative. Fact Sheet for Patients:  StrictlyIdeas.no Fact Sheet for Healthcare Providers: BankingDealers.co.za This test is not yet approved or cleared by the [place] FDA and has been authorized for detection and/or diagnosis of SARS-CoV-2 by FDA under an Emergency Use Authorization (EUA).  This EUA will remain in effect (meaning this test can be used) for the duration of the COVID-19 declaration under Section 564(b)(1) of the Act, 21 U.S.C. section 360bbb-3(b)(1), unless the authorization is terminated or revoked sooner. Performed at [facility], [address] [place], [place] [address]     Coagulation Studies: Recent Labs    09/30/19 0925  LABPROT 12.9  INR 1.0    Urinalysis: No results for input(s): COLORURINE, LABSPEC, PHURINE, GLUCOSEU, HGBUR, BILIRUBINUR, KETONESUR, PROTEINUR, UROBILINOGEN, NITRITE, LEUKOCYTESUR in the last 168 hours.  Invalid input(s): APPERANCEUR  Lipid Panel:    Component Value Date/Time   CHOL 128 06/13/2019 1258   TRIG 40 06/13/2019 1258   HDL 63 06/13/2019 1258   CHOLHDL 2.0 06/13/2019 1258   VLDL 8 06/13/2019 1258   LDLCALC 57 06/13/2019 1258    HgbA1C:  Lab Results  Component Value Date   HGBA1C 5.4 06/13/2019    Urine Drug Screen:      Component Value Date/Time   LABOPIA NONE DETECTED 06/13/2019 1107   COCAINSCRNUR NONE DETECTED 06/13/2019 1107   LABBENZ NONE DETECTED 06/13/2019 1107   AMPHETMU NONE DETECTED  06/13/2019 1107   THCU NONE DETECTED 06/13/2019 1107   LABBARB NONE DETECTED 06/13/2019 1107    Alcohol Level: No results for input(s): ETH in the last 168 hours.  Other results: EKG: sinus rhythm at 74 bpm.  Imaging: Ct Head Wo Contrast  Result Date: 09/30/2019 CLINICAL DATA:  Right leg numbness. Numbness  in right fourth and fifth digits. Slurred speech. EXAM: CT HEAD WITHOUT CONTRAST TECHNIQUE: Contiguous axial images were obtained from the base of the skull through the vertex without intravenous contrast. COMPARISON:  06/13/2019 FINDINGS: Brain: No evidence of acute infarction, hemorrhage, hydrocephalus, extra-axial collection or mass lesion/mass effect. Right frontal encephalomalacia and left posterior parietal encephalomalacia identified. Chronic left cerebellar hemisphere infarct is also again noted. There is mild diffuse low-attenuation within the subcortical and periventricular white matter compatible with chronic microvascular disease. Prominence of the CSF spaces and ventricles compatible with brain atrophy. Vascular: No hyperdense vessel or unexpected calcification. Skull: Normal. Negative for fracture or focal lesion. Sinuses/Orbits: No acute finding. Other: None. IMPRESSION: 1. No acute intracranial abnormalities. 2. Chronic small vessel ischemic change and brain atrophy. 3. Chronic right frontal, left posterior parietal and left cerebellar hemisphere infarcts. Electronically Signed   By: Kerby Moors M.D.   On: 09/30/2019 09:46   Mr Brain Wo Contrast  Result Date: 09/30/2019 CLINICAL DATA:  Focal neuro deficit. Rule out stroke. Right leg numbness. EXAM: MRI HEAD WITHOUT CONTRAST TECHNIQUE: Multiplanar, multiecho pulse sequences of the brain and surrounding structures were obtained without intravenous contrast. COMPARISON:  MRI head 06/13/2019 FINDINGS: Brain: Negative for acute infarct. Generalized mild atrophy without hydrocephalus Chronic infarct right frontal lobe with mild  chronic hemosiderin deposition. Chronic infarct left occipital parietal lobe. Mild chronic ischemic change in the white matter. Small chronic infarct left cerebellum. Chronic microhemorrhage right parietal lobe. No mass lesion or midline shift. Vascular: Normal arterial flow voids. Skull and upper cervical spine: Negative Sinuses/Orbits: Paranasal sinuses clear.  Bilateral cataract surgery Other: None IMPRESSION: Negative for acute infarct.  Chronic ischemia as described above. Electronically Signed   By: Franchot Gallo M.D.   On: 09/30/2019 12:48    Assessment: 78 y.o. female with history of HLD and stroke in the past who presents after an episode of right leg weakness and later right hand numbness.  Patient currently at baseline.  On Plavix and statin prior to admission.  Has not tolerated ASA well in the past but did take an aspirin this morning.   MRI of the brain reviewed and shows no acute changes.  Patient admitted for observation.  Stroke work performed in July and was unremarkable.  Patient has had a TEE in the past as well that was unremarkable.  Loop recorder has been placed.  Followed by Dr. Leonie Man on an outpatient basis.    Stroke Risk Factors - hyperlipidemia  Plan: 1. HgbA1c, fasting lipid panel pending 2. Would not repeat echocardiogram and carotid dopplers at this time.   3. PT consult, OT consult, Speech consult 4. Prophylactic therapy-Dual antiplatelet therapy with ASA 81mg  and Plavix 75mg  while hospitalized.  Due to ASA sensitivity may return to Plavix 75mg  daily alone at discharge.  Continue statin. 5. NPO until RN stroke swallow screen 6. Telemetry monitoring 7. Frequent neuro checks 8. Loop recorder interrogation 9. EEG   Alexis Goodell, MD Neurology [phone] 09/30/2019, 2:07 PM

## 2019-10-01 ENCOUNTER — Other Ambulatory Visit: Payer: Medicare Other

## 2019-10-01 ENCOUNTER — Ambulatory Visit: Payer: Medicare Other | Admitting: Family Medicine

## 2019-10-01 DIAGNOSIS — R29898 Other symptoms and signs involving the musculoskeletal system: Secondary | ICD-10-CM | POA: Diagnosis not present

## 2019-10-01 DIAGNOSIS — G459 Transient cerebral ischemic attack, unspecified: Secondary | ICD-10-CM | POA: Diagnosis not present

## 2019-10-01 DIAGNOSIS — I10 Essential (primary) hypertension: Secondary | ICD-10-CM | POA: Diagnosis not present

## 2019-10-01 DIAGNOSIS — E785 Hyperlipidemia, unspecified: Secondary | ICD-10-CM | POA: Diagnosis not present

## 2019-10-01 DIAGNOSIS — F329 Major depressive disorder, single episode, unspecified: Secondary | ICD-10-CM | POA: Diagnosis not present

## 2019-10-01 LAB — LIPID PANEL
Cholesterol: 132 mg/dL (ref 0–200)
HDL: 49 mg/dL (ref >40)
LDL Cholesterol: 67 mg/dL (ref 0–99)
Total CHOL/HDL Ratio: 2.7 RATIO
Triglycerides: 79 mg/dL (ref <150)
VLDL: 16 mg/dL (ref 0–40)

## 2019-10-01 LAB — HEMOGLOBIN A1C
Hgb A1c MFr Bld: 5.7 % — ABNORMAL HIGH (ref 4.8–5.6)
Mean Plasma Glucose: 116.89 mg/dL

## 2019-10-01 MED ORDER — ASPIRIN EC 81 MG PO TBEC
81.0000 mg | DELAYED_RELEASE_TABLET | Freq: Every day | ORAL | Status: DC
  Administered 2019-10-01: 81 mg via ORAL
  Filled 2019-10-01: qty 1

## 2019-10-01 NOTE — Progress Notes (Addendum)
   Call placed for interrogation of loop device.   Update: Contacted representative for interrogation, which found no evidence of arrhythmia. Contacted IM with these results.  Signed, Arvil Chaco, PA-C 10/01/2019, 12:05 PM Pager [phone]

## 2019-10-01 NOTE — Evaluation (Signed)
Occupational Therapy Evaluation Patient Details Name: Melody Ochoa MRN: [id number] DOB: Apr 30, 1941 Today's Date: 10/01/2019    History of Present Illness From MD Note: Melody Ochoa is an 78 y.o. female with a history of stroke, last in July of this year, who presents after awakening with right leg numbness.  Patient reports that she went to bed (09/29/19) at baseline.  She awakened today (09/30/19) and noted that her right leg was numb.  When she attempted to stand her right leg would not hold her.  By the time she was seen in the ED her symptoms had resolved.  While getting her MRI she had tingling in the last two fingers of her right hand that spread to involve her entire right hand.  This has resolved as of this time as well.  Initial NIHSS of 0.   Clinical Impression   Melody Ochoa was seen for OT evaluation this date. Prior to hospital admission, pt was independent in all aspects of ADL/IADL, using a SPC for functional mobility. Pt endorses at least 2 falls in the past year. Pt lives with her spouse in a 1 story home with 4 steps to enter with bilateral hand rail. Currently pt reporting symptoms have resolved. Pt demonstrates baseline independence to perform ADL and mobility tasks and no strength, sensory, coordination, cognitive, or visual deficits appreciated with assessment. No skilled OT needs identified. Will sign off. Please re-consult if additional OT needs arise.     Follow Up Recommendations  No OT follow up    Equipment Recommendations  None recommended by OT    Recommendations for Other Services       Precautions / Restrictions Precautions Precautions: Fall Precaution Comments: High fall Restrictions Weight Bearing Restrictions: No      Mobility Bed Mobility Overal bed mobility: Modified Independent             General bed mobility comments: HOB elevated with mildly increased time to perform. Otherwise, no physical assist needed this  date.  Transfers Overall transfer level: Independent               General transfer comment: Pt completes STS w/o assistance. Ambulates in room with occasional 1 hand hold on counter top/bed rail to stead herself. Uses SPC at baseline. No LOB noted during functional activities without UE support.    Balance Overall balance assessment: Mild deficits observed, not formally tested                                         ADL either performed or assessed with clinical judgement   ADL Overall ADL's : At baseline                                       General ADL Comments: Pt reporting symptoms have resolved. Performs face/hand washing and oral care while standing at sink this date. Does not require AD for ambulation in room, but states she uses a [facility] for amb further distances at baseline. Completes toileting independnetly w/o need for assist during transfer or peri-care.     Vision Baseline Vision/History: Wears glasses Wears Glasses: Reading only Patient Visual Report: No change from baseline       Perception     Praxis      Pertinent Vitals/Pain Pain Assessment: No/denies  pain     Hand Dominance Right   Extremity/Trunk Assessment Upper Extremity Assessment Upper Extremity Assessment: Overall WFL for tasks assessed(BUE grossly 4/5, with no sensory or FMC deficits noted with assessment this date. Pt reporting numbness has resolved completely.)   Lower Extremity Assessment Lower Extremity Assessment: Overall WFL for tasks assessed;Defer to PT evaluation   Cervical / Trunk Assessment Cervical / Trunk Assessment: Normal   Communication Communication Communication: No difficulties   Cognition Arousal/Alertness: Awake/alert Behavior During Therapy: WFL for tasks assessed/performed Overall Cognitive Status: Within Functional Limits for tasks assessed                                 General Comments: Pt pleasant, agreeable  to OT evaluation. Follows multi-step commands consistently t/o evaluation.   General Comments       Exercises Other Exercises Other Exercises: Pt educated in falls prevention strategies for home and hospital, safe use of AE for functional mobility, and routines modifications to support safety and functional independence upon hospital DC. Other Exercises: OT engaged pt in ADLs including toileting and standing grooming tasks at sink this date. Pt completed oral care, hand hygiene, and face washing given set-up assist. Pt completes toileting with supervision for safety this date.   Shoulder Instructions      Home Living Family/patient expects to be discharged to:: Private residence Living Arrangements: Spouse/significant other Available Help at Discharge: Family;Available 24 hours/day Type of Home: House Home Access: Stairs to enter [employer] of Steps: 4 Entrance Stairs-Rails: Can reach both;Left;Right Home Layout: One level     Bathroom Shower/Tub: Occupational psychologist: Standard Bathroom Accessibility: Yes   Home Equipment: Environmental consultant - 2 wheels;Cane - single point;Bedside commode;Shower seat - built in          Prior Functioning/Environment Level of Independence: Independent with assistive device(s)        Comments: Mod Ind with amb limited community distances with a SPC, no fall history, Ind with ADLs        OT Problem List: Impaired sensation;Decreased safety awareness;Decreased knowledge of use of DME or AE      OT Treatment/Interventions:      OT Goals(Current goals can be found in the care plan section) Acute Rehab OT Goals Patient Stated Goal: To discharge in time to be able to have lunch with a friend OT Goal Formulation: All assessment and education complete, DC therapy Time For Goal Achievement: 10/01/19 Potential to Achieve Goals: Good  OT Frequency:     Barriers to D/C:            Co-evaluation              AM-PAC OT "6  Clicks" Daily Activity     Outcome Measure Help from another person eating meals?: None Help from another person taking care of personal grooming?: None Help from another person toileting, which includes using toliet, bedpan, or urinal?: None Help from another person bathing (including washing, rinsing, drying)?: A Little Help from another person to put on and taking off regular upper body clothing?: None Help from another person to put on and taking off regular lower body clothing?: None 6 Click Score: 23   End of Session Equipment Utilized During Treatment: Gait belt  Activity Tolerance: Patient tolerated treatment well Patient left: in chair;with call bell/phone within reach;with chair alarm set;Other (comment)(With SPT in room to begin session.)  OT Visit Diagnosis:  Other abnormalities of gait and mobility (R26.89);History of falling (Z91.81)                Time: [id number] OT Time Calculation (min): 34 min Charges:  OT General Charges $OT Visit: 1 Visit OT Evaluation $OT Eval Low Complexity: 1 Low OT Treatments $Self Care/Home Management : 23-37 mins  Shara Blazing, M.S., OTR/L Ascom: [phone] 10/01/19, 10:24 AM

## 2019-10-01 NOTE — Progress Notes (Signed)
SLP Cancellation Note  Patient Details Name: Melody Ochoa MRN: [id number] DOB: 16-Apr-1941   Cancelled treatment:       Reason Eval/Treat Not Completed: SLP screened, no needs identified, will sign off(chart reviewed; consulted NSG then met w/ pt in room). Pt denied any difficulty swallowing and is currently on a regular diet; tolerates swallowing pills w/ water per NSG. Pt conversed at conversational level w/out deficits noted; pt and NSG denied any speech-language deficits. Pt discussed her wishes to go home this afternoon and declined ordering a lunch meal. She gave a brief story of a ring her husband bought for her.  No further skilled ST services indicated as pt appears at her baseline. Pt agreed. NSG to reconsult if any change in status while admitted.     Orinda Kenner, MS, CCC-SLP Watson,Katherine 10/01/2019, 11:15 AM

## 2019-10-01 NOTE — Procedures (Signed)
ELECTROENCEPHALOGRAM REPORT   Patient: Melody Ochoa       Room #: 110A-AA EEG No. ID: [id number] Age: 78 y.o.        Sex: female Referring Physician: Stark Jock Report Date:  10/01/2019        Interpreting Physician: Alexis Goodell  History: Melody Ochoa is an 78 y.o. female with transient right sided weakness  Medications:  ASA, Wellbutrin, Lipitor, Plavix, Cymbalta, Pepcid, Lyrica, Spiriva  Conditions of Recording:  This is a 21 channel routine scalp EEG performed with bipolar and monopolar montages arranged in accordance to the international 10/20 system of electrode placement. One channel was dedicated to EKG recording.  The patient is in the awake and drowsy states.  Description:  The waking background activity consists of a low voltage, symmetrical, fairly well organized, 8 Hz alpha activity, seen from the parieto-occipital and posterior temporal regions.  Low voltage fast activity, poorly organized, is seen anteriorly and is at times superimposed on more posterior regions.  A mixture of theta and alpha rhythms are seen from the central and temporal regions. The patient drowses with slowing to irregular, low voltage theta and beta activity.   Stage II sleep is not obtained. No epileptiform activity is noted.   Hyperventilation was not performed. Intermittent photic stimulation was performed but failed to illicit any change in the tracing.   IMPRESSION: Normal electroencephalogram, awake, drowsy and with activation procedures. There are no focal lateralizing or epileptiform features.   Alexis Goodell, MD Neurology [phone] 10/01/2019, 12:39 PM

## 2019-10-01 NOTE — Evaluation (Addendum)
Physical Therapy Evaluation Patient Details Name: Melody Ochoa MRN: [id number] DOB: May 01, 1941 Today's Date: 10/01/2019   History of Present Illness  From MD Note: Pt is an 78 y.o. female with a history of stroke, last in July of this year, who presents after awakening with right leg numbness.  Patient reports that she went to bed (09/29/19) at baseline.  She awakened today (09/30/19) and noted that her right leg was numb.  When she attempted to stand her right leg would not hold her.  By the time she was seen in the ED her symptoms had resolved.  While getting her MRI she had tingling in the last two fingers of her right hand that spread to involve her entire right hand.  This has resolved as of this time as well.  Initial NIHSS of 0. MRI negative for acute infarct, but some chronic ischemia.  Clinical Impression  Pt presented with min deficits in transfers, mobility, gait, balance, activity tolerance, and LLE strength. Pt was given neuro screen: bilat sensation and proprioception intact and symmetrical, LLE (4-/5) < RLE (5/5). Pt ambulated with noted gait abnormalities shifting weight off of the LLE, which pt reported as normal for her. Pt ambulated 50 ft before taking a sit down rest, vitals were checked and WFL. Pt completed 3 stairs ascent/decent, with good control using R-rail for balance. Pt will benefit from OPPT services upon discharge to safely address above deficits for decreased fall risk, increase high-level balance, and return to PLOF.      Follow Up Recommendations Outpatient PT    Equipment Recommendations  None recommended by PT    Recommendations for Other Services       Precautions / Restrictions Precautions Precautions: Fall Restrictions Weight Bearing Restrictions: No      Mobility  Bed Mobility Overal bed mobility: Modified Independent             General bed mobility comments: HOB elevated, with time and energy mildly increased to perform. Otherwise,  no physical assist needed this date.  Transfers Overall transfer level: Independent               General transfer comment: Pt completed sit<>stands transfer Ind.  Ambulation/Gait Ambulation/Gait assistance: Supervision Gait Distance (Feet): 50 Feet x2,   Gait Pattern/deviations: Decreased weight shift to left;Step-through pattern Gait velocity: Decreased    Stairs Stairs: Yes Stairs assistance: Supervision Stair Management: One rail Right Number of Stairs: 3 General stair comments: Good concentric control going forwards with a step-through pattern, and good eccentric control backwards with a step-to pattern.  Wheelchair Mobility    Modified Rankin (Stroke Patients Only)       Balance Overall balance assessment: Mild deficits observed, not formally tested                                           Pertinent Vitals/Pain Pain Assessment: No/denies pain    Home Living Family/patient expects to be discharged to:: Private residence Living Arrangements: Spouse/significant other Available Help at Discharge: Family;Available 24 hours/day Type of Home: House Home Access: Stairs to enter Entrance Stairs-Rails: Can reach both;Left;Right Entrance Stairs-Number of Steps: 4 Home Layout: One level Home Equipment: Walker - 2 wheels;Cane - single point;Bedside commode;Shower seat - built in      Prior Function Level of Independence: Independent with assistive device(s)         Comments: Mod  Ind with amb limited community distances with a SPC, Ind with ADLs. 3-4 falls in the last 6 months: one due to tennis shoes, one due to door threshhold, one on [place] stone [place]walks [place].     Hand Dominance   Dominant Hand: Right    Extremity/Trunk Assessment   Upper Extremity Assessment Upper Extremity Assessment: Overall WFL for tasks assessed    Lower Extremity Assessment Lower Extremity Assessment: LLE deficits/detail LLE Deficits / Details: LLE was  4-/5, compared to RLE which was 5/5. LLE Sensation: WNL LLE Coordination: WNL    Cervical / Trunk Assessment Cervical / Trunk Assessment: Normal  Communication   Communication: No difficulties  Cognition Arousal/Alertness: Awake/alert Behavior During Therapy: WFL for tasks assessed/performed Overall Cognitive Status: Within Functional Limits for tasks assessed                                 General Comments: Pt pleasant, agreeable to PT evaluation. Follows multi-step commands consistently t/o evaluation.      General Comments      Exercises Total Joint Exercises Hip ABduction/ADduction: AROM;Strengthening;Both;5 reps Marching in Standing: AROM;Strengthening;Both;5 reps General Exercises - Lower Extremity Hip ABduction/ADduction: Standing;Both;Strengthening;5 reps Other Exercises: Dynamic standing exercises with min UE support. Other Exercises:   Assessment/Plan    PT Assessment Patient needs continued PT services  PT Problem List Decreased strength;Decreased mobility;Decreased activity tolerance;Decreased balance       PT Treatment Interventions      PT Goals (Current goals can be found in the Care Plan section)  Acute Rehab PT Goals Patient Stated Goal: To be able to go walking wih her husband and little dog. PT Goal Formulation: With patient Time For Goal Achievement: 10/19/19 Potential to Achieve Goals: Good    Frequency Min 2X/week   Barriers to discharge        Co-evaluation               AM-PAC PT "6 Clicks" Mobility  Outcome Measure Help needed turning from your back to your side while in a flat bed without using bedrails?: None Help needed moving from lying on your back to sitting on the side of a flat bed without using bedrails?: None Help needed moving to and from a bed to a chair (including a wheelchair)?: None Help needed standing up from a chair using your arms (e.g., wheelchair or bedside chair)?: None Help needed to walk  in hospital room?: A Little Help needed climbing 3-5 steps with a railing? : A Little 6 Click Score: 22    End of Session Equipment Utilized During Treatment: Gait belt Activity Tolerance: Patient tolerated treatment well;Patient limited by fatigue Patient left: with call bell/phone within reach;with bed alarm set;in chair Nurse Communication: Mobility status PT Visit Diagnosis: Unsteadiness on feet (R26.81);Muscle weakness (generalized) (M62.81);Repeated falls (R29.6)    Time: 0930-1008 PT Time Calculation (min) (ACUTE ONLY): 38 min   Charges:             Juanda Crumble "Gus" Jeannette Corpus, SPT  10/01/19, 2:30 PM

## 2019-10-01 NOTE — Discharge Summary (Signed)
[facility] NAME: Melody Ochoa    MR#:  [id number]  DATE OF BIRTH:  1941-01-07  DATE OF ADMISSION:  09/30/2019   ADMITTING PHYSICIAN: Epifanio Lesches, MD  DATE OF DISCHARGE: 10/01/2019  PRIMARY CARE PHYSICIAN: Burnard Hawthorne, FNP   ADMISSION DIAGNOSIS:  TIA (transient ischemic attack) [G45.9] DISCHARGE DIAGNOSIS:  Active Problems:   Transient right leg weakness  SECONDARY DIAGNOSIS:   Past Medical History:  Diagnosis Date  . Arthritis   . Atheromatous plaque 10/18/2017  . Back pain   . Cancer ([place])    skin  . Depression   . Diverticulosis   . Dizziness    patient had episode of dizziness when came in room. hx past no dx  . GERD (gastroesophageal reflux disease)   . Heart murmur   . Hyperlipidemia 10/18/2017  . PONV (postoperative nausea and vomiting)    yrs ago  . Stroke [facility])    HOSPITAL COURSE:  Chief complaint; right leg weakness and numbness on the right hand  History of presenting complaint; Melody Ochoa  is a 78 y.o. female with a known history of previous stroke comes in because of right leg weakness, numbness that happened this morning at 7 AM and symptoms persisted for 20 minutes and then resolved after that.  Patient also noticed numbness in right side ring finger and little finger at the same time and symptoms resolved by the time she came to ER.  Patient symptoms completely resolved now. Initial CT head at this time did not show acute changes and patient initial NIH stroke scale of 0 by the time she came to ER.  Patient admitted for further evaluation.  Hospital course;  #1 . Transient ischemic attack Patient presented with right leg weakness which resolved after 20 minutes.  Also had some numbness of the right hand which is also resolved.  Initial CT head negative for any acute findings.  Patient subsequently had MRI of the brain done which was negative for any acute findings.  Patient  seen by neurologist.  Appreciate input.  Patient apparently had a stroke work-up performed in July which was unremarkable.  TEE done at that time was also unremarkable.  Follows up with Dr. Leonie Man as outpatient.  Has a loop recorder in place.  This was interrogated during this admission with no evidence of any arrhythmias.  Patient already on Plavix and statins prior to admission.  Documented to be allergic to aspirin.  Neurologist recommended aspirin and Plavix only while hospitalized and to continue with Plavix 75 mg daily on discharge.  Continue statins on discharge.  Patient remains clinically and hemodynamically stable.  Back to baseline.  Swallowing fine.  EEG done also was normal.  No focal deficits at this time and wishes to be discharged home.  Follow-up with primary care physician as outpatient. #2, depression: Stable continue Cymbalta.   #3.  History of pulmonary fibrosis, not symptomatic, follows up with Dr. Ashby Dawes, continue as needed inhalers. 4.  Hyperlipidemia, patient on high intensity statins.  DISCHARGE CONDITIONS:  Stable CONSULTS OBTAINED:  Treatment Team:  Catarina Hartshorn, MD Kate Sable, MD DRUG ALLERGIES:   Allergies  Allergen Reactions  . Aleve [Naproxen Sodium] Other (See Comments)    Gi upset  . Aspirin Other (See Comments)    Stomach pain-aggravates diverticulosis  . Celebrex [Celecoxib] Other (See Comments)    Dizziness   . Effexor [Venlafaxine] Other (See Comments)    Hot flashes   .  Gabapentin Other (See Comments)    "Night terrors"  . Hydrocodone-Homatropine Diarrhea  . Ibuprofen Other (See Comments)    Gi upset   . Tape Itching and Other (See Comments)    Itchy blisters  . Vioxx [Rofecoxib] Other (See Comments)    Gi upset  . Other Rash and Other (See Comments)    bandaides  . Tramadol Other (See Comments)    hallucinations   DISCHARGE MEDICATIONS:   Allergies as of 10/01/2019      Reactions   Aleve [naproxen Sodium] Other (See  Comments)   Gi upset   Aspirin Other (See Comments)   Stomach pain-aggravates diverticulosis   Celebrex [celecoxib] Other (See Comments)   Dizziness   Effexor [venlafaxine] Other (See Comments)   Hot flashes   Gabapentin Other (See Comments)   "Night terrors"   Hydrocodone-homatropine Diarrhea   Ibuprofen Other (See Comments)   Gi upset   Tape Itching, Other (See Comments)   Itchy blisters   Vioxx [rofecoxib] Other (See Comments)   Gi upset   Other Rash, Other (See Comments)   bandaides   Tramadol Other (See Comments)   hallucinations      Medication List    TAKE these medications   albuterol 108 (90 Base) MCG/ACT inhaler Commonly known as: VENTOLIN HFA TAKE 2 PUFFS BY MOUTH EVERY 6 HOURS AS NEEDED FOR WHEEZE OR SHORTNESS OF BREATH   amLODipine 2.5 MG tablet Commonly known as: NORVASC Take 1 tablet (2.5 mg total) by mouth daily as needed. If blood pressure greater than 140/90.   atorvastatin 80 MG tablet Commonly known as: LIPITOR TAKE 1 TABLET (80 MG TOTAL) BY MOUTH DAILY AT 6 PM.   buPROPion 300 MG 24 hr tablet Commonly known as: WELLBUTRIN XL Take 300 mg by mouth daily.   clopidogrel 75 MG tablet Commonly known as: PLAVIX TAKE 1 TABLET BY MOUTH EVERY DAY   DULoxetine 60 MG capsule Commonly known as: CYMBALTA Take 60 mg by mouth daily.   famotidine 20 MG tablet Commonly known as: PEPCID Take 20 mg by mouth at bedtime.   fluticasone 50 MCG/ACT nasal spray Commonly known as: FLONASE Place 1 spray into both nostrils daily as needed for allergies or rhinitis (congestion).   pregabalin 50 MG capsule Commonly known as: Lyrica Take 1 capsule (50 mg total) by mouth 3 (three) times daily.   tiotropium 18 MCG inhalation capsule Commonly known as: SPIRIVA Place 1 capsule (18 mcg total) into inhaler and inhale daily for 30 days.   triamcinolone cream 0.1 % Commonly known as: KENALOG Apply 1 application topically 2 (two) times daily as needed (rash/skin  irritation.).        DISCHARGE INSTRUCTIONS:   DIET:  Cardiac diet DISCHARGE CONDITION:  Stable ACTIVITY:  Activity as tolerated OXYGEN:  Home Oxygen: No.  Oxygen Delivery: room air DISCHARGE LOCATION:  home   If you experience worsening of your admission symptoms, develop shortness of breath, life threatening emergency, suicidal or homicidal thoughts you must seek medical attention immediately by calling 911 or calling your MD immediately  if symptoms less severe.  You Must read complete instructions/literature along with all the possible adverse reactions/side effects for all the Medicines you take and that have been prescribed to you. Take any new Medicines after you have completely understood and accpet all the possible adverse reactions/side effects.   Please note  You were cared for by a hospitalist during your hospital stay. If you have any questions about your discharge medications  or the care you received while you were in the hospital after you are discharged, you can call the unit and asked to speak with the hospitalist on call if the hospitalist that took care of you is not available. Once you are discharged, your primary care physician will handle any further medical issues. Please note that NO REFILLS for any discharge medications will be authorized once you are discharged, as it is imperative that you return to your primary care physician (or establish a relationship with a primary care physician if you do not have one) for your aftercare needs so that they can reassess your need for medications and monitor your lab values.    On the day of Discharge:  VITAL SIGNS:  Blood pressure 127/61, pulse 72, temperature 98 F (36.7 C), temperature source Oral, resp. rate 18, height 5\' 4"  (1.626 m), weight 90.7 kg, SpO2 96 %. PHYSICAL EXAMINATION:  GENERAL:  78 y.o.-year-old patient lying in the bed with no acute distress.  EYES: Pupils equal, round, reactive to light and  accommodation. No scleral icterus. Extraocular muscles intact.  HEENT: Head atraumatic, normocephalic. Oropharynx and nasopharynx clear.  NECK:  Supple, no jugular venous distention. No thyroid enlargement, no tenderness.  LUNGS: Normal breath sounds bilaterally, no wheezing, rales,rhonchi or crepitation. No use of accessory muscles of respiration.  CARDIOVASCULAR: S1, S2 normal. No murmurs, rubs, or gallops.  ABDOMEN: Soft, non-tender, non-distended. Bowel sounds present. No organomegaly or mass.  EXTREMITIES: No pedal edema, cyanosis, or clubbing.  NEUROLOGIC: Cranial nerves II through XII are intact. Muscle strength 5/5 in all extremities. Sensation intact. Gait not checked.  PSYCHIATRIC: The patient is alert and oriented x 3.  SKIN: No obvious rash, lesion, or ulcer.  DATA REVIEW:   CBC Recent Labs  Lab 09/30/19 0925  WBC 6.8  HGB 10.5*  HCT 32.6*  PLT 233    Chemistries  Recent Labs  Lab 09/30/19 0925  NA 140  K 3.9  CL 106  CO2 24  GLUCOSE 99  BUN 15  CREATININE 1.04*  CALCIUM 8.9  AST 27  ALT 27  ALKPHOS 55  BILITOT 0.5     Microbiology Results  Results for orders placed or performed during the hospital encounter of 09/30/19  SARS CORONAVIRUS 2 (TAT 6-24 HRS) Nasopharyngeal Nasopharyngeal Swab     Status: None   Collection Time: 09/30/19 11:03 AM   Specimen: Nasopharyngeal Swab  Result Value Ref Range Status   SARS Coronavirus 2 NEGATIVE NEGATIVE Final    Comment: (NOTE) SARS-CoV-2 target nucleic acids are NOT DETECTED. The SARS-CoV-2 RNA is generally detectable in upper and lower respiratory specimens during the acute phase of infection. Negative results do not preclude SARS-CoV-2 infection, do not rule out co-infections with other pathogens, and should not be used as the sole basis for treatment or other patient management decisions. Negative results must be combined with clinical observations, patient history, and epidemiological information. The  expected result is Negative. Fact Sheet for Patients: SugarRoll.be Fact Sheet for Healthcare Providers: https://www.woods-mathews.com/ This test is not yet approved or cleared by the [place] FDA and  has been authorized for detection and/or diagnosis of SARS-CoV-2 by FDA under an Emergency Use Authorization (EUA). This EUA will remain  in effect (meaning this test can be used) for the duration of the COVID-19 declaration under Section 56 4(b)(1) of the Act, 21 U.S.C. section 360bbb-3(b)(1), unless the authorization is terminated or revoked sooner. Performed at [facility], [address],  [place] [address]     RADIOLOGY:  No results found.   Management plans discussed with the patient, family and they are in agreement.  CODE STATUS: Full Code   TOTAL TIME TAKING CARE OF THIS PATIENT: 37 minutes.    Jonice Cerra M.D on 10/01/2019 at 2:01 PM  Between 7am to 6pm - Pager - [phone]  After 6pm go to [email or web address] - Proofreader  [facility]  Office  [phone]  CC: Primary care physician; Burnard Hawthorne, FNP   Note: This dictation was prepared with Dragon dictation along with smaller phrase technology. Any transcriptional errors that result from this process are unintentional.

## 2019-10-01 NOTE — Progress Notes (Signed)
eeg completed ° °

## 2019-10-02 ENCOUNTER — Telehealth: Payer: Self-pay

## 2019-10-02 NOTE — Telephone Encounter (Signed)
Transition Care Management Follow-up Telephone Call   Date discharged? 10/01/19  How have you been since you were released from the hospital? Considering physical therapy if needed and plans to discuss during hospital follow up. Doing well overall. No symptoms presenting. Cane in use when walking.    Do you understand why you were in the hospital? Yes, right leg weakness.   Do you understand the discharge instructions? Yes, increase activity as needed and follow up with PMD.    Where were you discharged to? Home   Items Reviewed:  Medications reviewed: Yes, no change. Taking all scheduled medications without issues.   Allergies reviewed: Yes, none new.  Dietary changes reviewed: Yes, low sodium, heart healthy/cardiac diet.  Referrals reviewed: Yes.   Functional Questionnaire:   Activities of Daily Living (ADLs):   She states they are independent in the following: Bathing/dressing, grooming, self feed, toileting.  States they require assistance with the following: Cane in use when ambulating, meal prep with husband assist.   Any transportation issues/concerns?: None at this time.    Any patient concerns? None at this time.    Confirmed importance and date/time of follow-up visits scheduled Yes, appointment scheduled 10/08/19 @ 10:00.  Provider Appointment booked with Philis Nettle, FNP.   PMD is currently scheduled out of the office.   Confirmed with patient if condition begins to worsen call PCP or go to the ER.  Patient was given the office number and encouraged to call back with question or concerns.  : Yes.

## 2019-10-08 ENCOUNTER — Other Ambulatory Visit: Payer: Self-pay

## 2019-10-08 ENCOUNTER — Ambulatory Visit (INDEPENDENT_AMBULATORY_CARE_PROVIDER_SITE_OTHER): Payer: Medicare Other | Admitting: Family Medicine

## 2019-10-08 ENCOUNTER — Encounter: Payer: Self-pay | Admitting: Family Medicine

## 2019-10-08 DIAGNOSIS — J449 Chronic obstructive pulmonary disease, unspecified: Secondary | ICD-10-CM | POA: Diagnosis not present

## 2019-10-08 DIAGNOSIS — R29898 Other symptoms and signs involving the musculoskeletal system: Secondary | ICD-10-CM | POA: Diagnosis not present

## 2019-10-08 DIAGNOSIS — G459 Transient cerebral ischemic attack, unspecified: Secondary | ICD-10-CM

## 2019-10-08 NOTE — Progress Notes (Signed)
Patient ID: [name], female   DOB: Apr 25, 1941, 78 y.o.   MRN: [id number]    Virtual Visit via phone note  This visit type was conducted due to national recommendations for restrictions regarding the COVID-19 pandemic (e.g. social distancing).  This format is felt to be most appropriate for this patient at this time.  All issues noted in this document were discussed and addressed.  No physical exam was performed (except for noted visual exam findings with Video Visits).   I connected with Melody Ochoa today at 10:00 AM EST by a video enabled telemedicine application or telephone and verified that I am speaking with the correct person using two identifiers. Location patient: home Location provider: work or home office Persons participating in the virtual visit: patient, provider  I discussed the limitations, risks, security and privacy concerns of performing an evaluation and management service by telephone and the availability of in person appointments. I also discussed with the patient that there may be a patient responsible charge related to this service. The patient expressed understanding and agreed to proceed.  Interactive audio and video telecommunications were attempted between this provider and patient, however failed, due to patient having technical difficulties Visit completed over phone.   HPI:  Patient and I connected via phone follow-up.  Patient was admitted on 10/25 and discharged on 10/01/2019 due to diagnosis of TIA and transient leg weakness.  Hospital discharge summary reviewed.  CT of head was negative for any acute findings and MRI of brain was also negative for any acute findings.  TEE was done, unremarkable and EEG was also done which was normal.  Patient has a loop recorder which was interrogated, no evidence of arrhythmias.  She was seen by neurology Dr Leonie Man & advised to continue Plavix daily and follow-up as outpatient.  Patient states she is feeling well.   Denies any more episodes of leg weakness.  She is eating and drinking normally.  Going to the bathroom normally.  Patient also requesting referral back to pulmonology due to her current pulmonologist leaving the area.   ROS: See pertinent positives and negatives per HPI.  Past Medical History:  Diagnosis Date   Arthritis    Atheromatous plaque 10/18/2017   Back pain    Cancer ([place])    skin   Depression    Diverticulosis    Dizziness    patient had episode of dizziness when came in room. hx past no dx   GERD (gastroesophageal reflux disease)    Heart murmur    Hyperlipidemia 10/18/2017   PONV (postoperative nausea and vomiting)    yrs ago   Stroke [facility])     Past Surgical History:  Procedure Laterality Date   BACK SURGERY  13   BREAST BIOPSY Bilateral    cores "years ago"   BREAST EXCISIONAL BIOPSY Left 90's   CATARACT EXTRACTION W/PHACO Left 12/20/2018   Procedure: CATARACT EXTRACTION PHACO AND INTRAOCULAR LENS PLACEMENT ([place]) LEFT TOPICAL;  Surgeon: Leandrew Koyanagi, MD;  Location: [place];  Service: Ophthalmology;  Laterality: Left;   CATARACT EXTRACTION W/PHACO Right 01/24/2019   Procedure: CATARACT EXTRACTION PHACO AND INTRAOCULAR LENS PLACEMENT ([place]) RIGHT;  Surgeon: Leandrew Koyanagi, MD;  Location: [place];  Service: Ophthalmology;  Laterality: Right;   CERVICAL DISC SURGERY  09   COLONOSCOPY WITH PROPOFOL N/A 01/13/2016   Procedure: COLONOSCOPY WITH PROPOFOL;  Surgeon: [name], MD;  Location: [facility] ENDOSCOPY;  Service: Endoscopy;  Laterality: N/A;   FACELIFT  94  LOOP RECORDER INSERTION N/A 08/23/2017   Procedure: LOOP RECORDER INSERTION;  Surgeon: [place] Grayer, MD;  Location: [place] [facility];  Service: Cardiovascular;  Laterality: N/A;   TEE WITHOUT CARDIOVERSION N/A 08/12/2017   Procedure: TRANSESOPHAGEAL ECHOCARDIOGRAM (TEE);  Surgeon: Sueanne Margarita, MD;  Location: [facility] ENDOSCOPY;  Service: Cardiovascular;   Laterality: N/A;   TONSILLECTOMY     TOTAL HIP ARTHROPLASTY Right 04/22/2016   Procedure: TOTAL HIP ARTHROPLASTY;  Surgeon: Corky Mull, MD;  Location: [facility] ORS;  Service: Orthopedics;  Laterality: Right;    Family History  Problem Relation Age of Onset   Lung cancer Father    Aneurysm Brother    Stroke Brother    Diabetes Maternal Grandmother    Kidney disease Maternal Grandfather    Cushing syndrome Paternal Grandmother    Dementia Paternal Grandfather    Breast cancer Maternal Aunt 52    Social History   Tobacco Use   Smoking status: Former Smoker    Packs/day: 1.00    Years: 40.00    Pack years: 40.00    Types: Cigarettes    Quit date: 11/13/1996    Years since quitting: 22.9   Smokeless tobacco: Never Used  Substance Use Topics   Alcohol use: Yes    Comment: occ wine     Current Outpatient Medications:    albuterol (VENTOLIN HFA) 108 (90 Base) MCG/ACT inhaler, TAKE 2 PUFFS BY MOUTH EVERY 6 HOURS AS NEEDED FOR WHEEZE OR SHORTNESS OF BREATH, Disp: 18 g, Rfl: 2   amLODipine (NORVASC) 2.5 MG tablet, Take 1 tablet (2.5 mg total) by mouth daily as needed. If blood pressure greater than 140/90., Disp: 90 tablet, Rfl: 3   atorvastatin (LIPITOR) 80 MG tablet, TAKE 1 TABLET (80 MG TOTAL) BY MOUTH DAILY AT 6 PM., Disp: 90 tablet, Rfl: 1   buPROPion (WELLBUTRIN XL) 300 MG 24 hr tablet, Take 300 mg by mouth daily. , Disp: , Rfl:    clopidogrel (PLAVIX) 75 MG tablet, TAKE 1 TABLET BY MOUTH EVERY DAY, Disp: 30 tablet, Rfl: 11   DULoxetine (CYMBALTA) 60 MG capsule, Take 60 mg by mouth daily. , Disp: , Rfl:    famotidine (PEPCID) 20 MG tablet, Take 20 mg by mouth at bedtime. , Disp: , Rfl: 12   fluticasone (FLONASE) 50 MCG/ACT nasal spray, Place 1 spray into both nostrils daily as needed for allergies or rhinitis (congestion). , Disp: , Rfl:    pregabalin (LYRICA) 50 MG capsule, Take 1 capsule (50 mg total) by mouth 3 (three) times daily., Disp: 90 capsule, Rfl:  2   triamcinolone cream (KENALOG) 0.1 %, Apply 1 application topically 2 (two) times daily as needed (rash/skin irritation.). , Disp: , Rfl: 0   tiotropium (SPIRIVA) 18 MCG inhalation capsule, Place 1 capsule (18 mcg total) into inhaler and inhale daily for 30 days., Disp: 30 capsule, Rfl: 12  EXAM:  GENERAL: alert, oriented, sounds well and in no acute distress  LUNGS: Speaking in full sentences. No signs of respiratory distress, breathing rate appears normal, no obvious gross SOB, gasping or wheezing  PSYCH/NEURO: pleasant and cooperative, no obvious depression or anxiety, speech and thought processing grossly intact  ASSESSMENT AND PLAN:  Discussed the following assessment and plan:  Transient right leg weakness  Transient ischemic attack (TIA)  Chronic obstructive pulmonary disease, unspecified COPD type ([place]) - Plan: Ambulatory referral to Pulmonology  Patient is feeling back to her baseline.  She will continue all current medications as given on her  list upon discharge.  She will keep follow-up with neurology as planned.  New referral given to pulmonology due to current pulmonologist leaving the area.  Patient will monitor herself for any changes or new symptoms and call office right away and or go to ER if needed.  Patient understands the importance of being checked out if she has any type of new neurological symptoms and/or develop something like faintness, weakness, chest pain, shortness of breath.   I discussed the assessment and treatment plan with the patient. The patient was provided an opportunity to ask questions and all were answered. The patient agreed with the plan and demonstrated an understanding of the instructions.   The patient was advised to call back or seek an in-person evaluation if the symptoms worsen or if the condition fails to improve as anticipated.  I provided 23 minutes of non-face-to-face time during this encounter.   Jodelle Green, FNP

## 2019-10-09 ENCOUNTER — Ambulatory Visit (INDEPENDENT_AMBULATORY_CARE_PROVIDER_SITE_OTHER): Payer: Medicare Other | Admitting: *Deleted

## 2019-10-09 DIAGNOSIS — G459 Transient cerebral ischemic attack, unspecified: Secondary | ICD-10-CM | POA: Diagnosis not present

## 2019-10-10 LAB — CUP PACEART REMOTE DEVICE CHECK
Date Time Interrogation Session: 20201104014322
Implantable Pulse Generator Implant Date: 20180918

## 2019-10-11 ENCOUNTER — Other Ambulatory Visit: Payer: Self-pay

## 2019-10-15 ENCOUNTER — Other Ambulatory Visit (INDEPENDENT_AMBULATORY_CARE_PROVIDER_SITE_OTHER): Payer: Medicare Other

## 2019-10-15 ENCOUNTER — Encounter: Payer: Self-pay | Admitting: Student in an Organized Health Care Education/Training Program

## 2019-10-15 ENCOUNTER — Other Ambulatory Visit: Payer: Self-pay

## 2019-10-15 DIAGNOSIS — D649 Anemia, unspecified: Secondary | ICD-10-CM

## 2019-10-15 LAB — CBC WITH DIFFERENTIAL/PLATELET
Basophils Absolute: 0.1 10*3/uL (ref 0.0–0.1)
Basophils Relative: 1.1 % (ref 0.0–3.0)
Eosinophils Absolute: 0.3 10*3/uL (ref 0.0–0.7)
Eosinophils Relative: 4.4 % (ref 0.0–5.0)
HCT: 30.9 % — ABNORMAL LOW (ref 36.0–46.0)
Hemoglobin: 10.4 g/dL — ABNORMAL LOW (ref 12.0–15.0)
Lymphocytes Relative: 16.7 % (ref 12.0–46.0)
Lymphs Abs: 1 10*3/uL (ref 0.7–4.0)
MCHC: 33.6 g/dL (ref 30.0–36.0)
MCV: 86.8 fl (ref 78.0–100.0)
Monocytes Absolute: 0.5 10*3/uL (ref 0.1–1.0)
Monocytes Relative: 8.9 % (ref 3.0–12.0)
Neutro Abs: 4.2 10*3/uL (ref 1.4–7.7)
Neutrophils Relative %: 68.9 % (ref 43.0–77.0)
Platelets: 247 10*3/uL (ref 150.0–400.0)
RBC: 3.56 Mil/uL — ABNORMAL LOW (ref 3.87–5.11)
RDW: 14.7 % (ref 11.5–15.5)
WBC: 6.1 10*3/uL (ref 4.0–10.5)

## 2019-10-15 LAB — IBC PANEL
Iron: 56 ug/dL (ref 42–145)
Saturation Ratios: 19.6 % — ABNORMAL LOW (ref 20.0–50.0)
Transferrin: 204 mg/dL — ABNORMAL LOW (ref 212.0–360.0)

## 2019-10-15 LAB — FERRITIN: Ferritin: 71.5 ng/mL (ref 10.0–291.0)

## 2019-10-16 ENCOUNTER — Ambulatory Visit
Payer: Medicare Other | Attending: Student in an Organized Health Care Education/Training Program | Admitting: Student in an Organized Health Care Education/Training Program

## 2019-10-16 ENCOUNTER — Encounter: Payer: Self-pay | Admitting: Student in an Organized Health Care Education/Training Program

## 2019-10-16 ENCOUNTER — Telehealth: Payer: Self-pay | Admitting: Family

## 2019-10-16 DIAGNOSIS — M4316 Spondylolisthesis, lumbar region: Secondary | ICD-10-CM

## 2019-10-16 DIAGNOSIS — Z9889 Other specified postprocedural states: Secondary | ICD-10-CM | POA: Diagnosis not present

## 2019-10-16 DIAGNOSIS — G8929 Other chronic pain: Secondary | ICD-10-CM | POA: Diagnosis not present

## 2019-10-16 DIAGNOSIS — M1612 Unilateral primary osteoarthritis, left hip: Secondary | ICD-10-CM | POA: Diagnosis not present

## 2019-10-16 DIAGNOSIS — M19012 Primary osteoarthritis, left shoulder: Secondary | ICD-10-CM | POA: Diagnosis not present

## 2019-10-16 DIAGNOSIS — G894 Chronic pain syndrome: Secondary | ICD-10-CM | POA: Diagnosis not present

## 2019-10-16 DIAGNOSIS — M25512 Pain in left shoulder: Secondary | ICD-10-CM | POA: Diagnosis not present

## 2019-10-16 DIAGNOSIS — M5416 Radiculopathy, lumbar region: Secondary | ICD-10-CM | POA: Diagnosis not present

## 2019-10-16 DIAGNOSIS — M961 Postlaminectomy syndrome, not elsewhere classified: Secondary | ICD-10-CM | POA: Diagnosis not present

## 2019-10-16 MED ORDER — PREGABALIN 50 MG PO CAPS: 50.0000 mg | ORAL_CAPSULE | Freq: Three times a day (TID) | ORAL | 3 refills | Status: DC

## 2019-10-16 NOTE — Progress Notes (Signed)
Pain Management Virtual Encounter Note - Virtual Visit via [place] (real-time audio visits between healthcare provider and patient).   Patient's Phone No. & Preferred Pharmacy:  [phone] (home); [phone] (mobile); (Preferred) [phone] [email or web address]  [id number] Lorina Rabon, [facility] [place] [address] [place] [address] Phone: [phone] Fax: [phone]    Pre-screening note:  Our staff contacted Ms. Burningham and offered her an "in person", "face-to-face" appointment versus a telephone encounter. She indicated preferring the telephone encounter, at this time.   Reason for Virtual Visit: COVID-19*  Social distancing based on CDC and AMA recommendations.   I contacted MABLEAN VENDITTO on 10/16/2019 via video conference.      I clearly identified myself as Gillis Santa, MD. I verified that I was speaking with the correct person using two identifiers (Name: BRINKLEY MARQUISS, and date of birth: Sep 05, 1941).  Advanced Informed Consent I sought verbal advanced consent from [name] for virtual visit interactions. I informed Ms. Fridman of possible security and privacy concerns, risks, and limitations associated with providing "not-in-person" medical evaluation and management services. I also informed Ms. Martorelli of the availability of "in-person" appointments. Finally, I informed her that there would be a charge for the virtual visit and that she could be  personally, fully or partially, financially responsible for it. Ms. Nagai expressed understanding and agreed to proceed.   Historic Elements   Ms. LUBERTHA WOOLMAN is a 78 y.o. year old, female patient evaluated today after her last encounter by our practice on 09/18/2019. Ms. Bajo  has a past medical history of Arthritis, Atheromatous plaque (10/18/2017), Back pain, Cancer ([place]), Depression, Diverticulosis, Dizziness, GERD (gastroesophageal reflux disease),  Heart murmur, Hyperlipidemia (10/18/2017), PONV (postoperative nausea and vomiting), and Stroke ([place]). She also  has a past surgical history that includes Tonsillectomy; Facelift (94); Cervical disc surgery (09); Back surgery (13); Colonoscopy with propofol (N/A, 01/13/2016); Total hip arthroplasty (Right, 04/22/2016); TEE without cardioversion (N/A, 08/12/2017); LOOP RECORDER INSERTION (N/A, 08/23/2017); Breast excisional biopsy (Left, 90's); Breast biopsy (Bilateral); Cataract extraction w/PHACO (Left, 12/20/2018); and Cataract extraction w/PHACO (Right, 01/24/2019). Ms. Yearsley has a current medication list which includes the following prescription(s): albuterol, amlodipine, atorvastatin, bupropion, clopidogrel, duloxetine, famotidine, fluticasone, pregabalin, tiotropium, and triamcinolone cream. She  reports that she quit smoking about 22 years ago. Her smoking use included cigarettes. She has a 40.00 pack-year smoking history. She has never used smokeless tobacco. She reports current alcohol use. She reports that she does not use drugs. Ms. Bechtold is allergic to aleve [naproxen sodium]; aspirin; celebrex [celecoxib]; effexor [venlafaxine]; gabapentin; hydrocodone-homatropine; ibuprofen; tape; vioxx [rofecoxib]; other; and tramadol.   HPI  Today, she is being contacted for a post-procedure assessment.  Patient is doing well in regards to her left shoulder pain after her left glenohumeral steroid joint injection on 09/17/2019.  She states that this was more effective than her first injection performed on 08/08/2019.  She states that she does have improved range of motion.  Unfortunately, the patient did have a mini stroke she was admitted to the hospital on 09/30/2019.  CT of head and MRI of brain was negative along with TEE and EEG were also unremarkable.  Patient instructed to continue Plavix daily.  Will hold off on any interventional therapies that would require her stopping Plavix for at least the next 6  months.  I will refill her Lyrica as below.  Evaluation of last interventional procedure  09/17/2019 Procedure:   Type: Therapeutic Glenohumeral Joint (shoulder) Injection #  2 (#1 on 08/08/2019) Primary Purpose: Therapeutic Region: Superior Shoulder Area Level:  Shoulder Target Area: Glenohumeral Joint (shoulder) Approach: Posterior approach. Laterality: Left-Sided  Sedation: Please see nurses note for DOS. When no sedatives are used, the analgesic levels obtained are directly associated to the effectiveness of the local anesthetics. However, when sedation is provided, the level of analgesia obtained during the initial 1 hour following the intervention, is believed to be the result of a combination of factors. These factors may include, but are not limited to: 1. The effectiveness of the local anesthetics used. 2. The effects of the analgesic(s) and/or anxiolytic(s) used. 3. The degree of discomfort experienced by the patient at the time of the procedure. 4. The patients ability and reliability in recalling and recording the events. 5. The presence and influence of possible secondary gains and/or psychosocial factors. Reported result: Relief experienced during the 1st hour after the procedure:100%   (Ultra-Short Term Relief)            Interpretative annotation: Clinically appropriate result. Analgesia during this period is likely to be Local Anesthetic and/or IV Sedative (Analgesic/Anxiolytic) related.          Effects of local anesthetic: The analgesic effects attained during this period are directly associated to the localized infiltration of local anesthetics and therefore cary significant diagnostic value as to the etiological location, or anatomical origin, of the pain. Expected duration of relief is directly dependent on the pharmacodynamics of the local anesthetic used. Long-acting (4-6 hours) anesthetics used.  Reported result: Relief during the next 4 to 6 hour after the procedure: 100%    (Short-Term Relief)            Interpretative annotation: Clinically appropriate result. Analgesia during this period is likely to be Local Anesthetic-related.          Long-term benefit: Defined as the period of time past the expected duration of local anesthetics (1 hour for short-acting and 4-6 hours for long-acting). With the possible exception of prolonged sympathetic blockade from the local anesthetics, benefits during this period are typically attributed to, or associated with, other factors such as analgesic sensory neuropraxia, antiinflammatory effects, or beneficial biochemical changes provided by agents other than the local anesthetics.  Reported result: Extended relief following procedure:60%   (Long-Term Relief)            Interpretative annotation: Clinically appropriate result. Good relief. No permanent benefit expected. Inflammation plays a part in the etiology to the pain.          UDS:  Summary  Date Value Ref Range Status  02/15/2018 FINAL  Final    Comment:    ==================================================================== TOXASSURE COMP DRUG ANALYSIS,UR ==================================================================== Test                             Result       Flag       Units Drug Present and Declared for Prescription Verification   Gabapentin                     PRESENT      EXPECTED   Bupropion                      PRESENT      EXPECTED   Hydroxybupropion               PRESENT      EXPECTED  Hydroxybupropion is an expected metabolite of bupropion.   Duloxetine                     PRESENT      EXPECTED ==================================================================== Test                      Result    Flag   Units      Ref Range   Creatinine              90               mg/dL      >=20 ==================================================================== Declared Medications:  The flagging and interpretation on this report are based on the   following declared medications.  Unexpected results may arise from  inaccuracies in the declared medications.  **Note: The testing scope of this panel includes these medications:  Bupropion (Wellbutrin)  Duloxetine (Cymbalta)  Gabapentin  **Note: The testing scope of this panel does not include following  reported medications:  Atorvastatin (Lipitor)  Calcium carbonate (Calcium carbonate/Vitamin D)  Clopidogrel (Plavix)  Cyanocobalamin  Iron (Ferrous Sulfate)  Mupirocin (Bactroban)  Triamcinolone (Kenalog)  Vitamin C  Vitamin D (Calcium carbonate/Vitamin D) ==================================================================== For clinical consultation, please call [phone]. ====================================================================    Laboratory Chemistry Profile (12 mo)  Renal: 09/30/2019: BUN 15; Creatinine, Ser 1.04  Lab Results  Component Value Date   GFR 61.23 04/07/2018   GFRAA 59 (L) 09/30/2019   GFRNONAA 51 (L) 09/30/2019   Hepatic: 09/30/2019: Albumin 3.9 Lab Results  Component Value Date   AST 27 09/30/2019   ALT 27 09/30/2019   Other: 08/10/2019: Vitamin B-12 781 Note: Above Lab results reviewed.  Imaging  Last 90 days:  Ct Head Wo Contrast  Result Date: 09/30/2019 CLINICAL DATA:  Right leg numbness. Numbness in right fourth and fifth digits. Slurred speech. EXAM: CT HEAD WITHOUT CONTRAST TECHNIQUE: Contiguous axial images were obtained from the base of the skull through the vertex without intravenous contrast. COMPARISON:  06/13/2019 FINDINGS: Brain: No evidence of acute infarction, hemorrhage, hydrocephalus, extra-axial collection or mass lesion/mass effect. Right frontal encephalomalacia and left posterior parietal encephalomalacia identified. Chronic left cerebellar hemisphere infarct is also again noted. There is mild diffuse low-attenuation within the subcortical and periventricular white matter compatible with chronic microvascular disease.  Prominence of the CSF spaces and ventricles compatible with brain atrophy. Vascular: No hyperdense vessel or unexpected calcification. Skull: Normal. Negative for fracture or focal lesion. Sinuses/Orbits: No acute finding. Other: None. IMPRESSION: 1. No acute intracranial abnormalities. 2. Chronic small vessel ischemic change and brain atrophy. 3. Chronic right frontal, left posterior parietal and left cerebellar hemisphere infarcts. Electronically Signed   By: Kerby Moors M.D.   On: 09/30/2019 09:46   Ct Head Wo Contrast  Result Date: 08/10/2019 CLINICAL DATA:  Head trauma, ataxia EXAM: CT HEAD WITHOUT CONTRAST TECHNIQUE: Contiguous axial images were obtained from the base of the skull through the vertex without intravenous contrast. COMPARISON:  06/13/2019 FINDINGS: Brain: Generalized atrophy. Normal ventricular morphology. No midline shift or mass effect. Small vessel chronic ischemic changes of deep cerebral white matter. Old RIGHT frontal and LEFT parieto-occipital infarcts. Probable small old deep infarct LEFT cerebellum. No intracranial hemorrhage, mass lesion or evidence of acute infarction. No extra-axial fluid collections. Vascular: Atherosclerotic calcifications of internal carotid arteries at skull base. No hyperdense vessels. Skull: Intact Sinuses/Orbits: Clear Other: N/A IMPRESSION: Atrophy with small vessel chronic ischemic changes of deep cerebral white matter.  Old infarcts RIGHT frontal, LEFT parieto-occipital, and probably deep LEFT cerebellum. No acute intracranial abnormalities. Electronically Signed   By: Lavonia Dana M.D.   On: 08/10/2019 13:11   Mr Brain Wo Contrast  Result Date: 09/30/2019 CLINICAL DATA:  Focal neuro deficit. Rule out stroke. Right leg numbness. EXAM: MRI HEAD WITHOUT CONTRAST TECHNIQUE: Multiplanar, multiecho pulse sequences of the brain and surrounding structures were obtained without intravenous contrast. COMPARISON:  MRI head 06/13/2019 FINDINGS: Brain: Negative  for acute infarct. Generalized mild atrophy without hydrocephalus Chronic infarct right frontal lobe with mild chronic hemosiderin deposition. Chronic infarct left occipital parietal lobe. Mild chronic ischemic change in the white matter. Small chronic infarct left cerebellum. Chronic microhemorrhage right parietal lobe. No mass lesion or midline shift. Vascular: Normal arterial flow voids. Skull and upper cervical spine: Negative Sinuses/Orbits: Paranasal sinuses clear.  Bilateral cataract surgery Other: None IMPRESSION: Negative for acute infarct.  Chronic ischemia as described above. Electronically Signed   By: Franchot Gallo M.D.   On: 09/30/2019 12:48   Dg Pain Clinic C-arm 1-60 Min No Report  Result Date: 09/17/2019 Fluoro was used, but no Radiologist interpretation will be provided. Please refer to "NOTES" tab for provider progress note.  Dg Pain Clinic C-arm 1-60 Min No Report  Result Date: 08/08/2019 Fluoro was used, but no Radiologist interpretation will be provided. Please refer to "NOTES" tab for provider progress note.   Assessment  The primary encounter diagnosis was Primary osteoarthritis of left shoulder. Diagnoses of Chronic left shoulder pain, Chronic pain syndrome, History of lumbar surgery, Lumbar radiculopathy, Spondylolisthesis of lumbar region, Primary osteoarthritis of left hip, and Failed back surgical syndrome were also pertinent to this visit.  Plan of Care  I am having [name] maintain her DULoxetine, buPROPion, triamcinolone cream, famotidine, tiotropium, fluticasone, clopidogrel, amLODipine, atorvastatin, albuterol, and pregabalin.  Pharmacotherapy (Medications Ordered): Meds ordered this encounter  Medications  . pregabalin (LYRICA) 50 MG capsule    Sig: Take 1 capsule (50 mg total) by mouth 3 (three) times daily.    Dispense:  90 capsule    Refill:  3    Do not place this medication, or any other prescription from our practice, on "Automatic Refill".  Patient may have prescription filled one day early if pharmacy is closed on scheduled refill date.   Orders:  No orders of the defined types were placed in this encounter.  Follow-up plan:   Return if symptoms worsen or fail to improve.     Status post left shoulder intra-articular injection, posterior approach on 08/08/2019, 09/17/2019- helped, repeat PRN.     Recent Visits Date Type Provider Dept  09/17/19 Procedure visit Gillis Santa, MD [facility]  09/06/19 Office Visit Gillis Santa, MD [facility]  08/08/19 Procedure visit Gillis Santa, MD [facility]  07/24/19 Office Visit Gillis Santa, MD [facility]  Showing recent visits within past 90 days and meeting all other requirements   Today's Visits Date Type Provider Dept  10/16/19 Office Visit Gillis Santa, MD [facility]  Showing today's visits and meeting all other requirements   Future Appointments No visits were found meeting these conditions.  Showing future appointments within next 90 days and meeting all other requirements   I discussed the assessment and treatment plan with the patient. The patient was provided an opportunity to ask questions and all were answered. The patient agreed with the plan and demonstrated an understanding of the instructions.  Patient advised to call back  or seek an in-person evaluation if the symptoms or condition worsens.  Total duration of non-face-to-face encounter: 25 minutes.  Note by: Gillis Santa, MD Date: 10/16/2019; Time: 11:22 AM  Note: This dictation was prepared with Dragon dictation. Any transcriptional errors that may result from this process are unintentional.  Disclaimer:  * Given the special circumstances of the COVID-19 pandemic, the federal government has announced that the Office for Civil Rights (OCR) will exercise its enforcement discretion and will not impose penalties on physicians using telehealth in the event of  noncompliance with regulatory requirements under the [place] (HIPAA) in connection with the good faith provision of telehealth during the XX123456 national public health emergency. ([place])

## 2019-10-16 NOTE — Telephone Encounter (Signed)
Copied from [place] [id number]. Topic: Quick Communication - Lab Results (Clinic Use ONLY) >> Oct 16, 2019  1:07 PM Dorna Leitz, CMA wrote: Called patient to inform them of 10/16/19 lab results. When patient returns call, triage nurse may disclose results.   Pt calling back for lab results

## 2019-10-22 DIAGNOSIS — L820 Inflamed seborrheic keratosis: Secondary | ICD-10-CM | POA: Diagnosis not present

## 2019-10-22 DIAGNOSIS — L578 Other skin changes due to chronic exposure to nonionizing radiation: Secondary | ICD-10-CM | POA: Diagnosis not present

## 2019-10-22 DIAGNOSIS — L821 Other seborrheic keratosis: Secondary | ICD-10-CM | POA: Diagnosis not present

## 2019-10-29 NOTE — Progress Notes (Signed)
Carelink Summary Report / Loop Recorder 

## 2019-11-05 ENCOUNTER — Telehealth: Payer: Self-pay | Admitting: Student in an Organized Health Care Education/Training Program

## 2019-11-05 NOTE — Telephone Encounter (Signed)
Patient is calling requesting refill on Oxycodone. Has not had this med for some time. I set up appt for meds mgmt 11-08-19

## 2019-11-07 ENCOUNTER — Encounter: Payer: Self-pay | Admitting: Student in an Organized Health Care Education/Training Program

## 2019-11-08 ENCOUNTER — Other Ambulatory Visit: Payer: Self-pay

## 2019-11-08 ENCOUNTER — Telehealth: Payer: Self-pay | Admitting: *Deleted

## 2019-11-08 ENCOUNTER — Ambulatory Visit
Payer: Medicare Other | Attending: Student in an Organized Health Care Education/Training Program | Admitting: Student in an Organized Health Care Education/Training Program

## 2019-11-08 ENCOUNTER — Encounter: Payer: Self-pay | Admitting: Student in an Organized Health Care Education/Training Program

## 2019-11-08 DIAGNOSIS — Z9889 Other specified postprocedural states: Secondary | ICD-10-CM | POA: Diagnosis not present

## 2019-11-08 DIAGNOSIS — M1612 Unilateral primary osteoarthritis, left hip: Secondary | ICD-10-CM | POA: Diagnosis not present

## 2019-11-08 DIAGNOSIS — M25512 Pain in left shoulder: Secondary | ICD-10-CM | POA: Diagnosis not present

## 2019-11-08 DIAGNOSIS — G8929 Other chronic pain: Secondary | ICD-10-CM | POA: Diagnosis not present

## 2019-11-08 DIAGNOSIS — G894 Chronic pain syndrome: Secondary | ICD-10-CM

## 2019-11-08 DIAGNOSIS — M5416 Radiculopathy, lumbar region: Secondary | ICD-10-CM

## 2019-11-08 DIAGNOSIS — M19012 Primary osteoarthritis, left shoulder: Secondary | ICD-10-CM | POA: Diagnosis not present

## 2019-11-08 MED ORDER — OXYCODONE-ACETAMINOPHEN 5-325 MG PO TABS: 1.0000 | ORAL_TABLET | Freq: Every day | ORAL | 0 refills | Status: DC | PRN

## 2019-11-08 MED ORDER — PREGABALIN 50 MG PO CAPS: 50.0000 mg | ORAL_CAPSULE | Freq: Two times a day (BID) | ORAL | 3 refills | Status: DC

## 2019-11-08 MED ORDER — OXYCODONE-ACETAMINOPHEN 5-325 MG PO TABS: 1.0000 | ORAL_TABLET | Freq: Every day | ORAL | 0 refills | Status: AC | PRN

## 2019-11-08 NOTE — Telephone Encounter (Signed)
Attempted to call for pre appointment review of meds/allergies. Message left at home number, voicemail box full at mobile number.

## 2019-11-08 NOTE — Telephone Encounter (Signed)
Patient returned our call about 15 min ago. Nurse was unavailable at that time. I attempted to call her back, she was in the shower and unable to take the call. I will call again.

## 2019-11-08 NOTE — Progress Notes (Signed)
Pain Management Virtual Encounter Note - Virtual Visit via [place] (real-time audio visits between healthcare provider and patient).   Patient's Phone No. & Preferred Pharmacy:  [phone] (home); [phone] (mobile); (Preferred) [phone] [email or web address]  [facility][id number] Lorina Rabon, [facility] [address] [place] [address] Phone: [phone] Fax: [phone]    Pre-screening note:  Our staff contacted Melody Ochoa and offered her an "in person", "face-to-face" appointment versus a telephone encounter. She indicated preferring the telephone encounter, at this time.   Reason for Virtual Visit: COVID-19*  Social distancing based on CDC and AMA recommendations.   I contacted Melody Ochoa on 11/08/2019 via video conference.      I clearly identified myself as Gillis Santa, MD. I verified that I was speaking with the correct person using two identifiers (Name: Melody Ochoa, and date of birth: 1941-09-26).  Advanced Informed Consent I sought verbal advanced consent from [name] for virtual visit interactions. I informed Melody Ochoa of possible security and privacy concerns, risks, and limitations associated with providing "not-in-person" medical evaluation and management services. I also informed Melody Ochoa of the availability of "in-person" appointments. Finally, I informed her that there would be a charge for the virtual visit and that she could be  personally, fully or partially, financially responsible for it. Melody Ochoa expressed understanding and agreed to proceed.   Historic Elements   Melody Ochoa is a 78 y.o. year old, female patient evaluated today after her last encounter by our practice on 11/08/2019. Melody Ochoa  has a past medical history of Arthritis, Atheromatous plaque (10/18/2017), Back pain, Cancer ([place]), Depression, Diverticulosis, Dizziness, GERD (gastroesophageal reflux disease), Heart  murmur, Hyperlipidemia (10/18/2017), PONV (postoperative nausea and vomiting), and Stroke ([place]). She also  has a past surgical history that includes Tonsillectomy; Facelift (89); Cervical disc surgery (09); Back surgery (13); Colonoscopy with propofol (N/A, 01/13/2016); Total hip arthroplasty (Right, 04/22/2016); TEE without cardioversion (N/A, 08/12/2017); LOOP RECORDER INSERTION (N/A, 08/23/2017); Breast excisional biopsy (Left, 90's); Breast biopsy (Bilateral); Cataract extraction w/PHACO (Left, 12/20/2018); and Cataract extraction w/PHACO (Right, 01/24/2019). Melody Ochoa has a current medication list which includes the following prescription(s): albuterol, amlodipine, atorvastatin, bupropion, clopidogrel, duloxetine, famotidine, fluticasone, pregabalin, triamcinolone cream, oxycodone-acetaminophen, oxycodone-acetaminophen, and tiotropium. She  reports that she quit smoking about 23 years ago. Her smoking use included cigarettes. She has a 40.00 pack-year smoking history. She has never used smokeless tobacco. She reports current alcohol use. She reports that she does not use drugs. Melody Ochoa is allergic to aleve [naproxen sodium]; aspirin; celebrex [celecoxib]; effexor [venlafaxine]; gabapentin; hydrocodone-homatropine; ibuprofen; tape; vioxx [rofecoxib]; other; and tramadol.   HPI  Today, she is being contacted for medication management.   First session of PT yesterday- did lumbar strengthening exercises, is pleased with her therapist Is requesting a refill of Oxycodone for her shoulder pain Lyrica at three times a day is causing sedation, encouraged patient to take qhs and then a daytime dose PRN especially if we're adding Oxycodone back to her med regimen Minimize fall risk/sedation/confusion  Pharmacotherapy Assessment  Analgesic: Percocet 5 mg BID prn #45/month Monitoring: Pharmacotherapy: No side-effects or adverse reactions reported. [place] PMP: PDMP reviewed during this encounter.       Compliance:  No problems identified. Effectiveness: Clinically acceptable. Plan: Refer to "POC".  UDS:  Summary  Date Value Ref Range Status  02/15/2018 FINAL  Final    Comment:    ==================================================================== TOXASSURE COMP DRUG ANALYSIS,UR ==================================================================== Test  Result       Flag       Units Drug Present and Declared for Prescription Verification   Gabapentin                     PRESENT      EXPECTED   Bupropion                      PRESENT      EXPECTED   Hydroxybupropion               PRESENT      EXPECTED    Hydroxybupropion is an expected metabolite of bupropion.   Duloxetine                     PRESENT      EXPECTED ==================================================================== Test                      Result    Flag   Units      Ref Range   Creatinine              90               mg/dL      >=20 ==================================================================== Declared Medications:  The flagging and interpretation on this report are based on the  following declared medications.  Unexpected results may arise from  inaccuracies in the declared medications.  **Note: The testing scope of this panel includes these medications:  Bupropion (Wellbutrin)  Duloxetine (Cymbalta)  Gabapentin  **Note: The testing scope of this panel does not include following  reported medications:  Atorvastatin (Lipitor)  Calcium carbonate (Calcium carbonate/Vitamin D)  Clopidogrel (Plavix)  Cyanocobalamin  Iron (Ferrous Sulfate)  Mupirocin (Bactroban)  Triamcinolone (Kenalog)  Vitamin C  Vitamin D (Calcium carbonate/Vitamin D) ==================================================================== For clinical consultation, please call [phone]. ====================================================================    Laboratory Chemistry Profile (12 mo)  Renal:  09/30/2019: BUN 15; Creatinine, Ser 1.04  Lab Results  Component Value Date   GFR 61.23 04/07/2018   GFRAA 59 (L) 09/30/2019   GFRNONAA 51 (L) 09/30/2019   Hepatic: 09/30/2019: Albumin 3.9 Lab Results  Component Value Date   AST 27 09/30/2019   ALT 27 09/30/2019   Other: 08/10/2019: Vitamin B-12 781 Note: Above Lab results reviewed.  Imaging  CUP PACEART REMOTE DEVICE CHECK Carelink summary report received. Battery status OK. Normal device function. No new symptom episodes, tachy episodes, brady, or pause episodes. No new AF episodes. Monthly summary reports and ROV/PRN   Assessment  The primary encounter diagnosis was Chronic left shoulder pain. Diagnoses of Primary osteoarthritis of left shoulder, History of lumbar surgery, Lumbar radiculopathy, Primary osteoarthritis of left hip, and Chronic pain syndrome were also pertinent to this visit.  Plan of Care   I have changed Melody Sosna. Ochoa's pregabalin. I am also having her start on oxyCODONE-acetaminophen and oxyCODONE-acetaminophen. Additionally, I am having her maintain her DULoxetine, buPROPion, triamcinolone cream, famotidine, tiotropium, fluticasone, clopidogrel, amLODipine, atorvastatin, and albuterol.   1. Percocet as below 2. Decrease Lyrica to 50 mg BID given sedation with TID  Pharmacotherapy (Medications Ordered): Meds ordered this encounter  Medications  . oxyCODONE-acetaminophen (PERCOCET) 5-325 MG tablet    Sig: Take 1-2 tablets by mouth daily as needed for severe pain. Must last 30 days.    Dispense:  45 tablet    Refill:  0    Chronic Pain. (STOP Act - Not  applicable). Fill one day early if closed on scheduled refill date.  Marland Kitchen oxyCODONE-acetaminophen (PERCOCET) 5-325 MG tablet    Sig: Take 1-2 tablets by mouth daily as needed for severe pain. Must last 30 days.    Dispense:  45 tablet    Refill:  0    Chronic Pain. (STOP Act - Not applicable). Fill one day early if closed on scheduled refill date.  .  pregabalin (LYRICA) 50 MG capsule    Sig: Take 1 capsule (50 mg total) by mouth 2 (two) times daily.    Dispense:  90 capsule    Refill:  3    Do not place this medication, or any other prescription from our practice, on "Automatic Refill". Patient may have prescription filled one day early if pharmacy is closed on scheduled refill date.    Follow-up plan:   Return in about 3 months (around 02/06/2020) for Medication Management, in person, (UDS).     Status post left shoulder intra-articular injection, posterior approach on 08/08/2019, 09/17/2019- helped, repeat PRN.      Recent Visits Date Type Provider Dept  10/16/19 Office Visit Gillis Santa, MD [facility]  09/17/19 Procedure visit Gillis Santa, MD [facility]  09/06/19 Office Visit Gillis Santa, MD [facility]  Showing recent visits within past 90 days and meeting all other requirements   Today's Visits Date Type Provider Dept  11/08/19 Office Visit Gillis Santa, MD [facility]  Showing today's visits and meeting all other requirements   Future Appointments No visits were found meeting these conditions.  Showing future appointments within next 90 days and meeting all other requirements   I discussed the assessment and treatment plan with the patient. The patient was provided an opportunity to ask questions and all were answered. The patient agreed with the plan and demonstrated an understanding of the instructions.  Patient advised to call back or seek an in-person evaluation if the symptoms or condition worsens.  Total duration of non-face-to-face encounter: 25 minutes.  Note by: Gillis Santa, MD Date: 11/08/2019; Time: 1:52 PM  Note: This dictation was prepared with Dragon dictation. Any transcriptional errors that may result from this process are unintentional.  Disclaimer:  * Given the special circumstances of the COVID-19 pandemic, the federal government has announced that the  Office for Civil Rights (OCR) will exercise its enforcement discretion and will not impose penalties on physicians using telehealth in the event of noncompliance with regulatory requirements under the [place] (HIPAA) in connection with the good faith provision of telehealth during the XX123456 national public health emergency. (Maria Antonia)

## 2019-11-12 ENCOUNTER — Ambulatory Visit (INDEPENDENT_AMBULATORY_CARE_PROVIDER_SITE_OTHER): Payer: Medicare Other | Admitting: *Deleted

## 2019-11-12 DIAGNOSIS — Z8673 Personal history of transient ischemic attack (TIA), and cerebral infarction without residual deficits: Secondary | ICD-10-CM

## 2019-11-13 LAB — CUP PACEART REMOTE DEVICE CHECK
Date Time Interrogation Session: 20201208072445
Implantable Pulse Generator Implant Date: 20180918

## 2019-11-27 ENCOUNTER — Encounter: Payer: Self-pay | Admitting: Adult Health

## 2019-11-27 ENCOUNTER — Ambulatory Visit (INDEPENDENT_AMBULATORY_CARE_PROVIDER_SITE_OTHER): Payer: Medicare Other | Admitting: Adult Health

## 2019-11-27 DIAGNOSIS — J8410 Pulmonary fibrosis, unspecified: Secondary | ICD-10-CM | POA: Diagnosis not present

## 2019-11-27 NOTE — Patient Instructions (Signed)
Continue on Spiriva daily Activity as tolerated Set up for full PFTs High-resolution CT chest June 2021 Follow-up in 6 months (after CT chest) with Dr. Patsey Berthold and As needed

## 2019-11-27 NOTE — Progress Notes (Signed)
Virtual Visit via Telephone Note  I connected with Melody Ochoa on 11/27/19 at 11:00 AM EST by telephone and verified that I am speaking with the correct person using two identifiers.  Location: Patient: Home  Provider: Office    I discussed the limitations, risks, security and privacy concerns of performing an evaluation and management service by telephone and the availability of in person appointments. I also discussed with the patient that there may be a patient responsible charge related to this service. The patient expressed understanding and agreed to proceed.   History of Present Illness: 78 year old female former smoker (quit 11/1996-20-pack-year) seen for pulmonary consult June 2019 for lung nodule and abnormal CT chest with fibrosis noted. She carries a diagnosis of chronic bronchitis on Spiriva. Medical history significant for TIA  Today's televisit is a 4-month follow-up for pulmonary fibrosis and lung nodule.  Patient says since last visit she is doing well with her breathing.  She denies any cough or significant shortness of breath.  She says she has some occasional congestion that she uses Mucinex for.  Has not been on any recent antibiotics.  She says she is somewhat an active.  She has no shortness of breath with her daily activities or at rest. Unable to find previous pulmonary function testing.  Previous lab work showed a normal ACE level and negative rheumatoid factor.  CT chest in 2018 showed some scattered scarring and honeycombing.  CT chest was done June 2020 that showed moderate pulmonary fibrosis in a pattern without a clear apical and basilar gradient.  Findings were consistent with a probable UIP.  A nodule in  left upper lobe has been stable since 2018 patient is not on any oxygen.  She does take Spiriva.  Patient says that she has been on this for a while in she feels like it does help with her breathing. She denies any weight loss, chest pain, orthopnea or leg  swelling.   Observations/Objective: 2019 ACE 37 , RA neg   CT chest June 2020 shows moderate pulmonary fibrosis in a pattern without clear apical to basilar gradient, features irregular peripheral interstitial opacity, traction bronchiectasis, possible areas of honeycombing.  Especially in the left upper lobe.  Findings are consistent with probable UIP.  No significant change in the fissural opacity of the posterior left upper lobe measuring 1.1 x 1.4 cm.  Appears to be confluent fibrosis.  CT chest September 2018 showed scattered scarring and honeycombing of the lungs.  1.2 x 1.3 subsolid pulmonary nodule left upper lobe.  Assessment and Plan: Pulmonary fibrosis noted on CT chest-most recent CT chest June 2020 showed probable UIP pattern.  Previous lab work showed negative rheumatoid factor and normal ACE level. Patient has minimum symptoms.  For now we will get a baseline full pulmonary function testing. Repeat CT chest high-resolution June 2021  Left upper lobe lung nodule stable on serial CT chest.  Follow-up CT chest June 2021.  Chronic bronchitis in a previous smoker.  Has perceived benefit from Spiriva.  Continue current regimen  Plan  Patient Instructions  Continue on Spiriva daily Activity as tolerated Set up for full PFTs High-resolution CT chest June 2021 Follow-up in 6 months (after CT chest) with Dr. Patsey Berthold and As needed        Follow Up Instructions: Follow-up in 6 months and as needed   I discussed the assessment and treatment plan with the patient. The patient was provided an opportunity to ask questions and all were answered.  The patient agreed with the plan and demonstrated an understanding of the instructions.   The patient was advised to call back or seek an in-person evaluation if the symptoms worsen or if the condition fails to improve as anticipated.  I provided 24 minutes of non-face-to-face time during this encounter.   Rexene Edison, NP

## 2019-12-04 NOTE — Progress Notes (Signed)
Prior patient of Dr. Ashby Dawes, have reviewed note and data.  We will see her in follow-up as described below.

## 2019-12-11 ENCOUNTER — Telehealth: Payer: Self-pay

## 2019-12-11 NOTE — Telephone Encounter (Signed)
Spoke to pt and offered PFT at our [place] office, as [facility] will be canceling PFT's due to covid.  Pt stated that she would call back to schedule PFT once she is at home.

## 2019-12-12 NOTE — Telephone Encounter (Signed)
Lm for pt

## 2019-12-12 NOTE — Telephone Encounter (Addendum)
Spoke to pt, who wished to schedule PFT for 01/29/2020 at 4:00 at [facility] office. Message has been sent to patrice.  requesting that pt be placed on PFT schedule.  Pt was at an appointment and was unable to obtain [place] address.  Will call back pt to provide her with address.

## 2019-12-12 NOTE — Telephone Encounter (Signed)
Patient calling back - tried to connect call - pt accidentally got disconnected -pr

## 2019-12-13 ENCOUNTER — Ambulatory Visit (INDEPENDENT_AMBULATORY_CARE_PROVIDER_SITE_OTHER): Payer: Medicare Other | Admitting: Internal Medicine

## 2019-12-13 ENCOUNTER — Other Ambulatory Visit: Payer: Self-pay

## 2019-12-13 ENCOUNTER — Telehealth: Payer: Self-pay | Admitting: Family

## 2019-12-13 VITALS — BP 132/80 | HR 100 | Temp 97.8°F | Wt 207.0 lb

## 2019-12-13 DIAGNOSIS — N393 Stress incontinence (female) (male): Secondary | ICD-10-CM | POA: Diagnosis not present

## 2019-12-13 DIAGNOSIS — R109 Unspecified abdominal pain: Secondary | ICD-10-CM | POA: Diagnosis not present

## 2019-12-13 LAB — POC URINALSYSI DIPSTICK (AUTOMATED)
Bilirubin, UA: NEGATIVE
Blood, UA: NEGATIVE
Clarity, UA: NEGATIVE
Glucose, UA: NEGATIVE
Leukocytes, UA: NEGATIVE
Nitrite, UA: NEGATIVE
Protein, UA: POSITIVE — AB
Spec Grav, UA: >=1.03 — AB (ref 1.010–1.025)
Urobilinogen, UA: 0.2 E.U./dL
pH, UA: 6 (ref 5.0–8.0)

## 2019-12-13 NOTE — Telephone Encounter (Signed)
Pt called about having Pain on right side mid way of back/3 days/bladder issues/No fever/no vomiting. Please advise and thank you!

## 2019-12-13 NOTE — Telephone Encounter (Signed)
Per Patrice, 4:00 slot on 01/29/2020 has been taken. First available is 02/04/2020 at 2:00. Pt has been scheduled for this date/time as she was agreeable. Pt has been provided with our [place] office.  Nothing further is needed.

## 2019-12-13 NOTE — Telephone Encounter (Signed)
I spoke with patient & she as agreeable to go today to [facility] location. Pt was scheduled an appointment with Webb Silversmith, NP. Pt screened with no symptoms.

## 2019-12-13 NOTE — Progress Notes (Signed)
HPI  Pt presents to the clinic today with c/o right lower back pain and incontinence. The back pain began 2 days ago precipitated by a near fall when Melody Ochoa caught herself and jerked her lower back. Her back pain is worsened with movement but does not radiate. Her urinary incontinence began 3 months ago and is caused by laughing or standing up and occurs on average 3 times a day. Melody Ochoa denies urgency, frequency, hematuria, dysuria, nocturia, or fever/chills. Melody Ochoa has not tried anything OTC for her symptoms.    Review of Systems  Past Medical History:  Diagnosis Date  . Arthritis   . Atheromatous plaque 10/18/2017  . Back pain   . Cancer ([place])    skin  . Depression   . Diverticulosis   . Dizziness    patient had episode of dizziness when came in room. hx past no dx  . GERD (gastroesophageal reflux disease)   . Heart murmur   . Hyperlipidemia 10/18/2017  . PONV (postoperative nausea and vomiting)    yrs ago  . Stroke [facility])     Family History  Problem Relation Age of Onset  . Lung cancer Father   . Aneurysm Brother   . Stroke Brother   . Diabetes Maternal Grandmother   . Kidney disease Maternal Grandfather   . Cushing syndrome Paternal Grandmother   . Dementia Paternal Grandfather   . Breast cancer Maternal Aunt 52    Social History   Socioeconomic History  . Marital status: Married    Spouse name: Not on file  . Number of children: Not on file  . Years of education: Not on file  . Highest education level: Not on file  Occupational History  . Not on file  Tobacco Use  . Smoking status: Former Smoker    Packs/day: 0.50    Years: 40.00    Pack years: 20.00    Types: Cigarettes    Quit date: 11/13/1996    Years since quitting: 23.0  . Smokeless tobacco: Never Used  Substance and Sexual Activity  . Alcohol use: Yes    Comment: occ wine  . Drug use: No  . Sexual activity: Not Currently  Other Topics Concern  . Not on file  Social History Narrative  . Not on file    Social Determinants of Health   Financial Resource Strain:   . Difficulty of Paying Living Expenses: Not on file  Food Insecurity: No Food Insecurity  . Worried About Charity fundraiser in the Last Year: Never true  . Ran Out of Food in the Last Year: Never true  Transportation Needs:   . Lack of Transportation (Medical): Not on file  . Lack of Transportation (Non-Medical): Not on file  Physical Activity: Unknown  . Days of Exercise per Week: 0 days  . Minutes of Exercise per Session: Not on file  Stress:   . Feeling of Stress : Not on file  Social Connections:   . Frequency of Communication with Friends and Family: Not on file  . Frequency of Social Gatherings with Friends and Family: Not on file  . Attends Religious Services: Not on file  . Active Member of Clubs or Organizations: Not on file  . Attends Archivist Meetings: Not on file  . Marital Status: Not on file  Intimate Partner Violence:   . Fear of Current or Ex-Partner: Not on file  . Emotionally Abused: Not on file  . Physically Abused: Not on file  .  Sexually Abused: Not on file    Allergies  Allergen Reactions  . Aleve [Naproxen Sodium] Other (See Comments)    Gi upset  . Aspirin Other (See Comments)    Stomach pain-aggravates diverticulosis  . Celebrex [Celecoxib] Other (See Comments)    Dizziness   . Effexor [Venlafaxine] Other (See Comments)    Hot flashes   . Gabapentin Other (See Comments)    "Night terrors"  . Hydrocodone-Homatropine Diarrhea  . Ibuprofen Other (See Comments)    Gi upset   . Tape Itching and Other (See Comments)    Itchy blisters  . Vioxx [Rofecoxib] Other (See Comments)    Gi upset  . Other Rash and Other (See Comments)    bandaides  . Tramadol Other (See Comments)    hallucinations     Constitutional: Denies fever, malaise, fatigue, headache or abrupt weight changes.   GI: Denies abdominal pain, NVD, or constipation.  GU: Pt reports urinary incontinence.  Denies burning sensation, blood in urine, odor, discharge, urgency, frequency and pain with urination. MSK: Pt reports right lower back pain. Denies difficulty with walking or joint swelling.  Neuro: Pt denies dizziness, paresthesias, or difficulty with balance or coordination.   No other specific complaints in a complete review of systems (except as listed in HPI above).    Objective:   Physical Exam  BP 132/80   Pulse 100   Temp 97.8 F (36.6 C) (Temporal)   Wt 207 lb (93.9 kg)   SpO2 98%   BMI 35.53 kg/m    Wt Readings from Last 3 Encounters:  09/30/19 200 lb (90.7 kg)  09/17/19 204 lb (92.5 kg)  08/10/19 204 lb (92.5 kg)    General: Appears her stated age, obese, in NAD. Cardiovascular: Normal rate and rhythm. S1,S2 noted.   Pulmonary/Chest: Normal effort and positive vesicular breath sounds. No respiratory distress. No wheezes, rales or ronchi noted.  MSK: Tender to palpation right lower back. Abdomen: Soft. Normal bowel sounds. No distention or masses noted. No CVA tenderness.        Assessment & Plan:   Stress Incontinence, Right Flank Pain:  Urinalysis: normal Will send urine culture Drink plenty of fluids No indication for abx at this time Melody Ochoa declines starting Ditropan/Oxybutnin at this time Encouraged heat, stretching and massage to right lower back  RTC as needed or if symptoms persist. Webb Silversmith, NP' This visit occurred during the SARS-CoV-2 public health emergency.  Safety protocols were in place, including screening questions prior to the visit, additional usage of staff PPE, and extensive cleaning of exam room while observing appropriate contact time as indicated for disinfecting solutions.

## 2019-12-14 ENCOUNTER — Ambulatory Visit (INDEPENDENT_AMBULATORY_CARE_PROVIDER_SITE_OTHER): Payer: Medicare Other | Admitting: *Deleted

## 2019-12-14 ENCOUNTER — Encounter: Payer: Self-pay | Admitting: Internal Medicine

## 2019-12-14 DIAGNOSIS — Z8673 Personal history of transient ischemic attack (TIA), and cerebral infarction without residual deficits: Secondary | ICD-10-CM

## 2019-12-14 NOTE — Patient Instructions (Signed)

## 2019-12-16 LAB — CUP PACEART REMOTE DEVICE CHECK
Date Time Interrogation Session: 20210109192427
Implantable Pulse Generator Implant Date: 20180918

## 2019-12-17 ENCOUNTER — Ambulatory Visit: Payer: Medicare Other | Admitting: Neurology

## 2019-12-17 ENCOUNTER — Other Ambulatory Visit: Payer: Self-pay | Admitting: *Deleted

## 2019-12-17 ENCOUNTER — Telehealth: Payer: Self-pay

## 2019-12-17 MED ORDER — ATORVASTATIN CALCIUM 80 MG PO TABS: 80.0000 mg | ORAL_TABLET | Freq: Every day | ORAL | 0 refills | Status: DC

## 2019-12-17 NOTE — Telephone Encounter (Signed)
Patient no show for new patient appt today.

## 2019-12-19 ENCOUNTER — Encounter: Payer: Self-pay | Admitting: Neurology

## 2019-12-27 ENCOUNTER — Telehealth: Payer: Self-pay | Admitting: *Deleted

## 2019-12-27 DIAGNOSIS — D649 Anemia, unspecified: Secondary | ICD-10-CM

## 2019-12-27 NOTE — Telephone Encounter (Signed)
I received a call from [facility] stating that they received a IFOB that was postmarked in November. Specimen are only good for 14 days. If this is still needed, it will have to be recollected & patient need to bring it to our office (do not mail).  Thanks

## 2019-12-31 NOTE — Telephone Encounter (Signed)
Please inform patient what Melody Ochoa said below.   Since she is anemic, I would advise that we do collect these stools cards. I have reordered Can you put her name on another ifob and leave it at front desk for her to pick up? Please answer her questions about collection process so we can get a good specimen.

## 2019-12-31 NOTE — Addendum Note (Signed)
Addended by: Burnard Hawthorne on: 12/31/2019 04:26 PM   Modules accepted: Orders

## 2020-01-01 NOTE — Telephone Encounter (Signed)
Called Pt with information from NP [name]. Pt stated she understood, and will be in the office today to pick up the stool card kit.

## 2020-01-03 ENCOUNTER — Other Ambulatory Visit (HOSPITAL_COMMUNITY): Payer: Self-pay | Admitting: Psychiatry

## 2020-01-11 ENCOUNTER — Other Ambulatory Visit (INDEPENDENT_AMBULATORY_CARE_PROVIDER_SITE_OTHER): Payer: Medicare Other

## 2020-01-11 ENCOUNTER — Other Ambulatory Visit: Payer: Self-pay | Admitting: Family

## 2020-01-11 DIAGNOSIS — R195 Other fecal abnormalities: Secondary | ICD-10-CM

## 2020-01-11 DIAGNOSIS — D649 Anemia, unspecified: Secondary | ICD-10-CM

## 2020-01-11 LAB — FECAL OCCULT BLOOD, IMMUNOCHEMICAL: Fecal Occult Bld: POSITIVE — AB

## 2020-01-14 ENCOUNTER — Telehealth: Payer: Self-pay | Admitting: *Deleted

## 2020-01-14 ENCOUNTER — Other Ambulatory Visit: Payer: Self-pay

## 2020-01-14 ENCOUNTER — Ambulatory Visit (INDEPENDENT_AMBULATORY_CARE_PROVIDER_SITE_OTHER): Payer: Medicare Other | Admitting: *Deleted

## 2020-01-14 ENCOUNTER — Telehealth: Payer: Self-pay

## 2020-01-14 DIAGNOSIS — R195 Other fecal abnormalities: Secondary | ICD-10-CM

## 2020-01-14 DIAGNOSIS — Z8673 Personal history of transient ischemic attack (TIA), and cerebral infarction without residual deficits: Secondary | ICD-10-CM | POA: Diagnosis not present

## 2020-01-14 LAB — CUP PACEART REMOTE DEVICE CHECK
Date Time Interrogation Session: 20210207234426
Implantable Pulse Generator Implant Date: 20180918

## 2020-01-14 NOTE — Telephone Encounter (Signed)
Gastroenterology Pre-Procedure Review  Request Date: Tuesday 02/19/20 Requesting Physician: Dr. Allen Norris  PATIENT REVIEW QUESTIONS: The patient responded to the following health history questions as indicated:    1. Are you having any GI issues? yes (Positive Fecal Immuno Test R19.5) 2. Do you have a personal history of Polyps? yes (Pt said yes but none were noted on 2017 colon with Dr. Allen Norris) 3. Do you have a family history of Colon Cancer or Polyps? no 4. Diabetes Mellitus? no 5. Joint replacements in the past 12 months?no 6. Major health problems in the past 3 months?Back surgery May 2020 7. Any artificial heart valves, MVP, or defibrillator?no    MEDICATIONS & ALLERGIES:    Patient reports the following regarding taking any anticoagulation/antiplatelet therapy:   Plavix, Coumadin, Eliquis, Xarelto, Lovenox, Pradaxa, Brilinta, or Effient? yes (Clopidogrel 75 mg prescribed by Dr. Jens Som at [facility].  Blood thinner request sent.) Aspirin? no  Patient confirms/reports the following medications:  Current Outpatient Medications  Medication Sig Dispense Refill  . albuterol (VENTOLIN HFA) 108 (90 Base) MCG/ACT inhaler TAKE 2 PUFFS BY MOUTH EVERY 6 HOURS AS NEEDED FOR WHEEZE OR SHORTNESS OF BREATH 18 g 2  . amLODipine (NORVASC) 2.5 MG tablet Take 1 tablet (2.5 mg total) by mouth daily as needed. If blood pressure greater than 140/90. 90 tablet 3  . atorvastatin (LIPITOR) 80 MG tablet Take 1 tablet (80 mg total) by mouth daily at 6 PM. 90 tablet 0  . buPROPion (WELLBUTRIN XL) 300 MG 24 hr tablet Take 300 mg by mouth daily.     . clopidogrel (PLAVIX) 75 MG tablet TAKE 1 TABLET BY MOUTH EVERY DAY 30 tablet 11  . DULoxetine (CYMBALTA) 60 MG capsule Take 60 mg by mouth daily.     . famotidine (PEPCID) 20 MG tablet Take 20 mg by mouth at bedtime.   12  . ferrous sulfate 325 (65 FE) MG tablet Take 325 mg by mouth daily with breakfast.    . fluticasone (FLONASE) 50 MCG/ACT nasal spray Place 1  spray into both nostrils daily as needed for allergies or rhinitis (congestion).     . pregabalin (LYRICA) 50 MG capsule Take 1 capsule (50 mg total) by mouth 2 (two) times daily. 90 capsule 3  . tiotropium (SPIRIVA) 18 MCG inhalation capsule Place 1 capsule (18 mcg total) into inhaler and inhale daily for 30 days. 30 capsule 12  . triamcinolone cream (KENALOG) 0.1 % Apply 1 application topically 2 (two) times daily as needed (rash/skin irritation.).   0   No current facility-administered medications for this visit.    Patient confirms/reports the following allergies:  Allergies  Allergen Reactions  . Aleve [Naproxen Sodium] Other (See Comments)    Gi upset  . Aspirin Other (See Comments)    Stomach pain-aggravates diverticulosis  . Celebrex [Celecoxib] Other (See Comments)    Dizziness   . Effexor [Venlafaxine] Other (See Comments)    Hot flashes   . Gabapentin Other (See Comments)    "Night terrors"  . Hydrocodone-Homatropine Diarrhea  . Ibuprofen Other (See Comments)    Gi upset   . Tape Itching and Other (See Comments)    Itchy blisters  . Vioxx [Rofecoxib] Other (See Comments)    Gi upset  . Other Rash and Other (See Comments)    bandaides  . Tramadol Other (See Comments)    hallucinations    No orders of the defined types were placed in this encounter.   AUTHORIZATION INFORMATION Primary Insurance:  1D#: Group #:  Secondary Insurance: 1D#: Group #:  SCHEDULE INFORMATION: Date:  Time: Location:

## 2020-01-14 NOTE — Telephone Encounter (Signed)
   [facility] Pre-operative Risk Assessment    Request for surgical clearance:  1. What type of surgery is being performed? COLONOSCOPY   2. When is this surgery scheduled? 02/19/20   3. What type of clearance is required (medical clearance vs. Pharmacy clearance to hold med vs. Both)? MEDICAL  4. Are there any medications that need to be held prior to surgery and how long? PLAVIX    5. Practice name and name of physician performing surgery? [facility]; DR. Evangeline Gula Ochoa   6. What is your office phone number [phone]    7.   What is your office fax number [phone] ATTN: Melody Ochoa  8.   Anesthesia type (None, local, MAC, general) ? NOT LISTED   Julaine Hua 01/14/2020, 1:58 PM  _________________________________________________________________   (provider comments below)

## 2020-01-14 NOTE — Telephone Encounter (Signed)
   Primary Cardiologist: Dr. Caryl Comes   Chart reviewed as part of pre-operative protocol coverage. Last seen 02/01/2019. Procedure is not scheduled until 02/19/2020. Therefore, lets try to get patient an appointment scheduled with Dr. Caryl Comes or one of the APPs on his team for 12 month follow-up and pre-op evaluation prior to procedure.  Pre-op covering staff: - Please schedule appointment and call patient to inform them. - Please contact requesting surgeon's office via preferred method (i.e, phone, fax) to inform them of need for appointment prior to surgery.   Darreld Mclean, PA-C  01/14/2020, 3:00 PM

## 2020-01-15 NOTE — Telephone Encounter (Signed)
Message sent to EP scheduler [place] to set up pre op appt with Dr. Caryl Comes or his care team.

## 2020-01-15 NOTE — Progress Notes (Signed)
ILR Remote 

## 2020-01-16 NOTE — Telephone Encounter (Signed)
Pt has been scheduled to see Oda Kilts, PA 01/23/20 for pre op clearance.I will forward clearance note to PA for upcoming appt. Will send FYI to Dr. [name] in regards to upcoming appt for pre op clearance. I will remove from the pre op cal back pool.

## 2020-01-21 ENCOUNTER — Other Ambulatory Visit: Payer: Self-pay | Admitting: Internal Medicine

## 2020-01-22 ENCOUNTER — Other Ambulatory Visit: Payer: Self-pay | Admitting: Student in an Organized Health Care Education/Training Program

## 2020-01-22 DIAGNOSIS — G894 Chronic pain syndrome: Secondary | ICD-10-CM

## 2020-01-22 NOTE — Progress Notes (Deleted)
Electrophysiology Office Note Date: 01/22/2020  ID:  BILLIEJO HENRICH, DOB 1941-06-08, MRN [id number]  PCP: Burnard Hawthorne, FNP Electrophysiologist: Virl Axe, MD   CC: Pacemaker follow-up  FALISA MARCUCCI is a 79 y.o. female seen today for Dr. Caryl Comes . she presents today for cardiac clearance for colonoscopy.  Since last being seen in our clinic, the patient reports doing very well.  she denies chest pain, palpitations, dyspnea, PND, orthopnea, nausea, vomiting, dizziness, syncope, edema, weight gain, or early satiety.  Device History: Medtronic ILR PPM implanted 08/2017 for cryptogenic stroke  Past Medical History:  Diagnosis Date  . Arthritis   . Atheromatous plaque 10/18/2017  . Back pain   . Cancer ([facility])    skin  . Depression   . Diverticulosis   . Dizziness    patient had episode of dizziness when came in room. hx past no dx  . GERD (gastroesophageal reflux disease)   . Heart murmur   . Hyperlipidemia 10/18/2017  . PONV (postoperative nausea and vomiting)    yrs ago  . Stroke [facility])    Past Surgical History:  Procedure Laterality Date  . BACK SURGERY  13  . BREAST BIOPSY Bilateral    cores "years ago"  . BREAST EXCISIONAL BIOPSY Left 90's  . CATARACT EXTRACTION W/PHACO Left 12/20/2018   Procedure: CATARACT EXTRACTION PHACO AND INTRAOCULAR LENS PLACEMENT ([place]) LEFT TOPICAL;  Surgeon: Leandrew Koyanagi, MD;  Location: [place];  Service: Ophthalmology;  Laterality: Left;  . CATARACT EXTRACTION W/PHACO Right 01/24/2019   Procedure: CATARACT EXTRACTION PHACO AND INTRAOCULAR LENS PLACEMENT ([place]) RIGHT;  Surgeon: Leandrew Koyanagi, MD;  Location: [place];  Service: Ophthalmology;  Laterality: Right;  . CERVICAL DISC SURGERY  09  . COLONOSCOPY WITH PROPOFOL N/A 01/13/2016   Procedure: COLONOSCOPY WITH PROPOFOL;  Surgeon: [name], MD;  Location: [facility] ENDOSCOPY;  Service: Endoscopy;  Laterality: N/A;  . FACELIFT  94  . LOOP RECORDER  INSERTION N/A 08/23/2017   Procedure: LOOP RECORDER INSERTION;  Surgeon: Thompson Grayer, MD;  Location: [place] CV LAB;  Service: Cardiovascular;  Laterality: N/A;  . TEE WITHOUT CARDIOVERSION N/A 08/12/2017   Procedure: TRANSESOPHAGEAL ECHOCARDIOGRAM (TEE);  Surgeon: Sueanne Margarita, MD;  Location: [facility] ENDOSCOPY;  Service: Cardiovascular;  Laterality: N/A;  . TONSILLECTOMY    . TOTAL HIP ARTHROPLASTY Right 04/22/2016   Procedure: TOTAL HIP ARTHROPLASTY;  Surgeon: Corky Mull, MD;  Location: [facility] ORS;  Service: Orthopedics;  Laterality: Right;    Current Outpatient Medications  Medication Sig Dispense Refill  . albuterol (VENTOLIN HFA) 108 (90 Base) MCG/ACT inhaler TAKE 2 PUFFS BY MOUTH EVERY 6 HOURS AS NEEDED FOR WHEEZE OR SHORTNESS OF BREATH 18 g 2  . amLODipine (NORVASC) 2.5 MG tablet Take 1 tablet (2.5 mg total) by mouth daily as needed. If blood pressure greater than 140/90. 90 tablet 3  . atorvastatin (LIPITOR) 80 MG tablet Take 1 tablet (80 mg total) by mouth daily at 6 PM. 90 tablet 0  . buPROPion (WELLBUTRIN XL) 300 MG 24 hr tablet Take 300 mg by mouth daily.     . clopidogrel (PLAVIX) 75 MG tablet TAKE 1 TABLET BY MOUTH EVERY DAY 90 tablet 0  . DULoxetine (CYMBALTA) 60 MG capsule Take 60 mg by mouth daily.     . famotidine (PEPCID) 20 MG tablet Take 20 mg by mouth at bedtime.   12  . ferrous sulfate 325 (65 FE) MG tablet Take 325 mg by mouth daily with  breakfast.    . fluticasone (FLONASE) 50 MCG/ACT nasal spray Place 1 spray into both nostrils daily as needed for allergies or rhinitis (congestion).     . pregabalin (LYRICA) 50 MG capsule Take 1 capsule (50 mg total) by mouth 2 (two) times daily. 90 capsule 3  . tiotropium (SPIRIVA) 18 MCG inhalation capsule Place 1 capsule (18 mcg total) into inhaler and inhale daily for 30 days. 30 capsule 12  . triamcinolone cream (KENALOG) 0.1 % Apply 1 application topically 2 (two) times daily as needed (rash/skin irritation.).   0   No current  facility-administered medications for this visit.    Allergies:   Aleve [naproxen sodium], Aspirin, Celebrex [celecoxib], Effexor [venlafaxine], Gabapentin, Hydrocodone-homatropine, Ibuprofen, Tape, Vioxx [rofecoxib], Other, and Tramadol   Social History: Social History   Socioeconomic History  . Marital status: Married    Spouse name: Not on file  . Number of children: Not on file  . Years of education: Not on file  . Highest education level: Not on file  Occupational History  . Not on file  Tobacco Use  . Smoking status: Former Smoker    Packs/day: 0.50    Years: 40.00    Pack years: 20.00    Types: Cigarettes    Quit date: 11/13/1996    Years since quitting: 23.2  . Smokeless tobacco: Never Used  Substance and Sexual Activity  . Alcohol use: Yes    Comment: occ wine  . Drug use: No  . Sexual activity: Not Currently  Other Topics Concern  . Not on file  Social History Narrative  . Not on file   Social Determinants of Health   Financial Resource Strain:   . Difficulty of Paying Living Expenses: Not on file  Food Insecurity: No Food Insecurity  . Worried About Charity fundraiser in the Last Year: Never true  . Ran Out of Food in the Last Year: Never true  Transportation Needs:   . Lack of Transportation (Medical): Not on file  . Lack of Transportation (Non-Medical): Not on file  Physical Activity: Unknown  . Days of Exercise per Week: 0 days  . Minutes of Exercise per Session: Not on file  Stress:   . Feeling of Stress : Not on file  Social Connections:   . Frequency of Communication with Friends and Family: Not on file  . Frequency of Social Gatherings with Friends and Family: Not on file  . Attends Religious Services: Not on file  . Active Member of Clubs or Organizations: Not on file  . Attends Archivist Meetings: Not on file  . Marital Status: Not on file  Intimate Partner Violence:   . Fear of Current or Ex-Partner: Not on file  . Emotionally  Abused: Not on file  . Physically Abused: Not on file  . Sexually Abused: Not on file    Family History: Family History  Problem Relation Age of Onset  . Lung cancer Father   . Aneurysm Brother   . Stroke Brother   . Diabetes Maternal Grandmother   . Kidney disease Maternal Grandfather   . Cushing syndrome Paternal Grandmother   . Dementia Paternal Grandfather   . Breast cancer Maternal Aunt 52     Review of Systems: All other systems reviewed and are otherwise negative except as noted above.  Physical Exam: There were no vitals filed for this visit.   GEN- The patient is well appearing, alert and oriented x 3 today.  HEENT: normocephalic, atraumatic; sclera clear, conjunctiva pink; hearing intact; oropharynx clear; neck supple  Lungs- Clear to ausculation bilaterally, normal work of breathing.  No wheezes, rales, rhonchi Heart- Regular rate and rhythm, no murmurs, rubs or gallops  GI- soft, non-tender, non-distended, bowel sounds present  Extremities- no clubbing, cyanosis, or edema  MS- no significant deformity or atrophy Skin- warm and dry, no rash or lesion;  Psych- euthymic mood, full affect Neuro- strength and sensation are intact  ILR Interrogation- reviewed in detail today,  See PACEART report  EKG:  EKG is ordered today. The ekg ordered today shows ***  Recent Labs: 02/01/2019: BNP 112.6 08/10/2019: TSH 1.94 09/30/2019: ALT 27; BUN 15; Creatinine, Ser 1.04; Potassium 3.9; Sodium 140 10/15/2019: Hemoglobin 10.4; Platelets 247.0   Wt Readings from Last 3 Encounters:  12/13/19 207 lb (93.9 kg)  09/30/19 200 lb (90.7 kg)  09/17/19 204 lb (92.5 kg)     Other studies Reviewed: Additional studies/ records that were reviewed today include: Echo 06/2019 shows LVEF 50-55%, Previous EP office notes, Previous remote checks, Most recent labwork.   Assessment and Plan:  1. Cryptogenic stroke s/p Medtronic ILR ***  See Pace Art report No changes today  2. Age  related screening/Colonoscopy She is clear to proceed with this relatively low risk procedure.   3. HLD Per PCP.   Current medicines are reviewed at length with the patient today.   The patient {ACTIONS; HAS/DOES NOT HAVE:19233} concerns regarding her medicines.  The following changes were made today:  {NONE DEFAULTED:18576::"none"}  Labs/ tests ordered today include: *** No orders of the defined types were placed in this encounter.    Disposition:   Follow up with Dr. Caryl Comes in 12 months.    Jacalyn Lefevre, PA-C  01/22/2020 3:11 PM  [facility] [address] [place] [address] [phone] (office) [phone] (fax)

## 2020-01-23 ENCOUNTER — Encounter: Payer: Medicare Other | Admitting: Student

## 2020-01-23 ENCOUNTER — Other Ambulatory Visit: Payer: Self-pay | Admitting: Student in an Organized Health Care Education/Training Program

## 2020-01-23 DIAGNOSIS — G894 Chronic pain syndrome: Secondary | ICD-10-CM | POA: Diagnosis not present

## 2020-01-26 LAB — TOXASSURE SELECT 13 (MW), URINE

## 2020-01-28 ENCOUNTER — Telehealth: Payer: Self-pay | Admitting: Adult Health

## 2020-01-28 NOTE — Telephone Encounter (Signed)
[facility] is not scheduling PFT at this time unless they are Urgent.  Please advise if this PFT is urgent. Rhonda J Cobb

## 2020-01-28 NOTE — Telephone Encounter (Signed)
Not urgent, first available. ty

## 2020-01-29 ENCOUNTER — Ambulatory Visit: Payer: Medicare Other

## 2020-01-29 NOTE — Telephone Encounter (Signed)
Called and was advised that patient was asleep. Left message for pt to return my call to [phone]. Rhonda J Cobb

## 2020-01-29 NOTE — Telephone Encounter (Signed)
ATC patient. No answer. LMOVM for pt to contact me at [phone]. Rhonda J Cobb

## 2020-01-30 NOTE — Telephone Encounter (Signed)
Spoke with patient and advised patient that the hospitals are not able to schedule the PFT's at this time due to [place].  I offered to schedule at the Pulmonary office in [place], however, patient did not want to go to [place].  Pt stated to just contact her back once [facility] starting scheduling the test again and that she would prefer a late morning or early afternoon appointment.  Advised patient that we would contact her once [facility] starting scheduling these again. Pt voiced understanding and is aware that we will be in contact with her to schedule. Nothing else needed at this time. Rhonda J Cobb

## 2020-02-05 DIAGNOSIS — C44329 Squamous cell carcinoma of skin of other parts of face: Secondary | ICD-10-CM | POA: Diagnosis not present

## 2020-02-06 ENCOUNTER — Encounter: Payer: Medicare Other | Admitting: Student

## 2020-02-06 ENCOUNTER — Telehealth: Payer: Self-pay

## 2020-02-06 ENCOUNTER — Encounter: Payer: Self-pay | Admitting: Student in an Organized Health Care Education/Training Program

## 2020-02-06 NOTE — Telephone Encounter (Signed)
Lpm with appointment information. 02/06/20

## 2020-02-07 ENCOUNTER — Encounter: Payer: Self-pay | Admitting: Student in an Organized Health Care Education/Training Program

## 2020-02-07 ENCOUNTER — Ambulatory Visit
Payer: Medicare Other | Attending: Student in an Organized Health Care Education/Training Program | Admitting: Student in an Organized Health Care Education/Training Program

## 2020-02-07 ENCOUNTER — Other Ambulatory Visit: Payer: Self-pay

## 2020-02-07 DIAGNOSIS — M5416 Radiculopathy, lumbar region: Secondary | ICD-10-CM

## 2020-02-07 DIAGNOSIS — M4316 Spondylolisthesis, lumbar region: Secondary | ICD-10-CM

## 2020-02-07 DIAGNOSIS — M1612 Unilateral primary osteoarthritis, left hip: Secondary | ICD-10-CM

## 2020-02-07 DIAGNOSIS — M25512 Pain in left shoulder: Secondary | ICD-10-CM

## 2020-02-07 DIAGNOSIS — Z9889 Other specified postprocedural states: Secondary | ICD-10-CM

## 2020-02-07 DIAGNOSIS — G894 Chronic pain syndrome: Secondary | ICD-10-CM | POA: Diagnosis not present

## 2020-02-07 DIAGNOSIS — M19012 Primary osteoarthritis, left shoulder: Secondary | ICD-10-CM

## 2020-02-07 DIAGNOSIS — M961 Postlaminectomy syndrome, not elsewhere classified: Secondary | ICD-10-CM

## 2020-02-07 DIAGNOSIS — G8929 Other chronic pain: Secondary | ICD-10-CM

## 2020-02-07 DIAGNOSIS — Z79891 Long term (current) use of opiate analgesic: Secondary | ICD-10-CM

## 2020-02-07 MED ORDER — PREGABALIN 50 MG PO CAPS: ORAL_CAPSULE | ORAL | 3 refills | Status: DC

## 2020-02-07 MED ORDER — OXYCODONE-ACETAMINOPHEN 5-325 MG PO TABS: 1.0000 | ORAL_TABLET | Freq: Every day | ORAL | 0 refills | Status: AC | PRN

## 2020-02-07 NOTE — Progress Notes (Signed)
Patient: Melody Ochoa  Service Category: E/M  Provider: Gillis Santa, MD  DOB: 17-Aug-1941  DOS: 02/07/2020  Location: Office  MRN: [id number]  Setting: Ambulatory outpatient  Referring Provider: Burnard Hawthorne, FNP  Type: Established Patient  Specialty: Interventional Pain Management  PCP: Burnard Hawthorne, FNP  Location: Home  Delivery: TeleHealth     Virtual Encounter - Pain Management PROVIDER NOTE: Information contained herein reflects review and annotations entered in association with encounter. Interpretation of such information and data should be left to medically-trained personnel. Information provided to patient can be located elsewhere in the medical record under "Patient Instructions". Document created using STT-dictation technology, any transcriptional errors that may result from process are unintentional.    Contact & Pharmacy Preferred: [phone] Home: [phone] (home) Mobile: [phone] (mobile) E-mail: [email or web address]  [id number] [address]Phone: [phone]: [phone]  Pre-screening  Ms. Wiatrek offered "in-person" vs "virtual" encounter. She indicated preferring virtual for this encounter.   Reason COVID-19*  Social distancing based on CDC and AMA recommendations.   I contacted DJARA FEIDERon 02/07/2020 via telephone.      I clearly identified myself as BGillis Santa MD. I verified that I was speaking with the correct person using two identifiers (Name: DLINN CLAVIN and date of birth: 6August 80, 1942.  This visit was completed via telephone due to the restrictions of the COVID-19 pandemic. All issues as above were discussed and addressed but no physical exam was performed. If it was felt that the patient should be evaluated in the office, they were directed there. The patient verbally consented to this visit. Patient was unable to complete an audio/visual visit due  to Technical difficulties and/or Lack of internet. Due to the catastrophic nature of the COVID-19 pandemic, this visit was done through audio contact only.  Location of the patient: home address (see Epic for details)  Location of the provider: office  Consent I sought verbal advanced consent from DMichiel Cowboyfor virtual visit interactions. I informed Ms. MYanoof possible security and privacy concerns, risks, and limitations associated with providing "not-in-person" medical evaluation and management services. I also informed Ms. Loftin of the availability of "in-person" appointments. Finally, I informed her that there would be a charge for the virtual visit and that she could be  personally, fully or partially, financially responsible for it. Ms. MMilbergerexpressed understanding and agreed to proceed.   Historic Elements   Ms. DMARKERIA GOETSCHis a 79y.o. year old, female patient evaluated today after her last contact with our practice on 11/08/2019. Ms. MBoucher has a past medical history of Arthritis, Atheromatous plaque (10/18/2017), Back pain, [place], Depression, Diverticulosis, Dizziness, GERD (gastroesophageal reflux disease), Heart murmur, Hyperlipidemia (10/18/2017), PONV (postoperative nausea and vomiting), and St[place]. She also  has a past surgical history that includes Tonsillectomy; Facelift (94); Cervical disc surgery (09); Back surgery (13); Colonoscopy with propofol (N/A, 01/13/2016); Total hip arthroplasty (Right, 04/22/2016); TEE without cardioversion (N/A, 08/12/2017); LOOP RECORDER INSERTION (N/A, 08/23/2017); Breast excisional biopsy (Left, 90's); Breast biopsy (Bilateral); Cataract extraction w/PHACO (Left, 12/20/2018); and Cataract extraction w/PHACO (Right, 01/24/2019). Ms. MMimshas a current medication list which includes the following prescription(s): albuterol, amlodipine, atorvastatin, bupropion, clopidogrel, duloxetine, famotidine, ferrous sulfate,  fluticasone, [START ON 03/05/2020] oxycodone-acetaminophen, [START ON 04/04/2020] oxycodone-acetaminophen, pregabalin, triamcinolone cream, and tiotropium. She  reports that she quit smoking about 23 years ago. Her smoking use included cigarettes. She  has a 20.00 pack-year smoking history. She has never used smokeless tobacco. She reports current alcohol use. She reports that she does not use drugs. Ms. Plourde is allergic to aleve [naproxen sodium]; aspirin; celebrex [celecoxib]; effexor [venlafaxine]; gabapentin; hydrocodone-homatropine; ibuprofen; tape; vioxx [rofecoxib]; other; and tramadol.   HPI  Today, she is being contacted for medication management.   Had excision of cancerous mass from face yesterday with Dr Nehemiah Massed.  Patient's pain is at baseline.  Patient continues multimodal pain regimen as prescribed.  States that it provides pain relief and improvement in functional status. Takes Lyrica 50 mg during the day and 100 mg qhs and Oxycodone 5 mg BID prn as below.  Pharmacotherapy Assessment  Analgesic: 02/04/2020  2   11/08/2019  Oxycodone-Acetaminophen 5-325  45.00  30 Bi Lat   [id number]   Nor (2541)   0  11.25 MME  Medicare   [employer]    Monitoring: [place] PMP: PDMP reviewed during this encounter.       Pharmacotherapy: No side-effects or adverse reactions reported. Compliance: No problems identified. Effectiveness: Clinically acceptable. Plan: Refer to "POC".  UDS:  Summary  Date Value Ref Range Status  01/23/2020 Note  Final    Comment:    ==================================================================== ToxASSU[employer] Select 13 (MW) ==================================================================== Test                             Result       Flag       Units   NO DRUGS DETECTED. ==================================================================== Test                      Result    Flag   Units      Ref Range   Creatinine              219              mg/dL       >=20 ==================================================================== Declared Medications:  The flagging and interpretation on this report are based on the  following declared medications.  Unexpected results may arise from  inaccuracies in the declared medications.  **Note: The testing scope of this panel does not include the  following reported medications:  Albuterol  Amlodipine  Atorvastatin  Bupropion  Clopidogrel  Duloxetine  Famotidine  Fluticasone  Pregabalin  Tiotropium  Triamcinolone ==================================================================== For clinical consultation, please call [phone]. ====================================================================    Laboratory Chemistry Profile   Renal Lab Results  Component Value Date   BUN 15 09/30/2019   CREATININE 1.04 (H) 09/30/2019   GFR 61.23 04/07/2018   GFRAA 59 (L) 09/30/2019   GFRNONAA 51 (L) 09/30/2019    Hepatic Lab Results  Component Value Date   AST 27 09/30/2019   ALT 27 09/30/2019   ALBUMIN 3.9 09/30/2019   ALKPHOS 55 09/30/2019    Electrolytes Lab Results  Component Value Date   NA 140 09/30/2019   K 3.9 09/30/2019   CL 106 09/30/2019   CALCIUM 8.9 09/30/2019    Bone No results found for: VD25OH, VD125OH2TOT, CZ6606TK1, SW1093AT5, 25OHVITD1, 25OHVITD2, 25OHVITD3, TESTOFREE, TESTOSTERONE  Inflammation (CRP: Acute Phase) (ESR: Chronic Phase) No results found for: CRP, ESRSEDRATE, LATICACIDVEN    Note: Above Lab results reviewed.  Imaging  CUP PACEART REMOTE DEVICE CHECK Carelink summary report received. Battery status OK. Normal device function. No new symptom episodes, tachy episodes, brady, or pause episodes. No new AF episodes. Monthly summary reports and  ROV/PRN   AM[place]  Assessment  The primary encounter diagnosis was Chronic pain syndrome. Diagnoses of Chronic left shoulder pain, Primary osteoarthritis of left shoulder, History of lumbar surgery, Lumbar  radiculopathy, Primary osteoarthritis of left hip, Spondylolisthesis of lumbar region, Failed back surgical syndrome, and Long term current use of opiate analgesic were also pertinent to this visit.  Plan of Care  Ms. KAETLIN BULLEN has a current medication list which includes the following long-term medication(s): albuterol, amlodipine, atorvastatin, bupropion, clopidogrel, duloxetine, famotidine, pregabalin, and tiotropium.  Pharmacotherapy (Medications Ordered): Meds ordered this encounter  Medications  . oxyCODONE-acetaminophen (PERCOCET/ROXICET) 5-325 MG tablet    Sig: Take 1-2 tablets by mouth daily as needed for severe pain.    Dispense:  45 tablet    Refill:  0  . oxyCODONE-acetaminophen (PERCOCET/ROXICET) 5-325 MG tablet    Sig: Take 1-2 tablets by mouth daily as needed for severe pain.    Dispense:  45 tablet    Refill:  0  . pregabalin (LYRICA) 50 MG capsule    Sig: 50 mg during day, 100 mg at night    Dispense:  90 capsule    Refill:  3    Do not place this medication, or any other prescription from our practice, on "Automatic Refill". Patient may have prescription filled one day early if pharmacy is closed on scheduled refill date.   Follow-up plan:   Return in about 3 months (around 05/09/2020) for Medication Management, virtual.     Status post left shoulder intra-articular injection, posterior approach on 08/08/2019, 09/17/2019- helped, repeat PRN.       Recent Visits No visits were found meeting these conditions.  Showing recent visits within past 90 days and meeting all other requirements   Today's Visits Date Type Provider Dept  02/07/20 Office Visit Gillis Santa, MD [facility]  Showing today's visits and meeting all other requirements   Future Appointments No visits were found meeting these conditions.  Showing future appointments within next 90 days and meeting all other requirements   I discussed the assessment and treatment plan with the patient.  The patient was provided an opportunity to ask questions and all were answered. The patient agreed with the plan and demonstrated an understanding of the instructions.  Patient advised to call back or seek an in-person evaluation if the symptoms or condition worsens.  Duration of encounter: 25 minutes.  Note by: Gillis Santa, MD Date: 02/07/2020; Time: 9:39 AM

## 2020-02-11 DIAGNOSIS — Z4802 Encounter for removal of sutures: Secondary | ICD-10-CM | POA: Diagnosis not present

## 2020-02-14 ENCOUNTER — Ambulatory Visit (INDEPENDENT_AMBULATORY_CARE_PROVIDER_SITE_OTHER): Payer: Medicare Other | Admitting: *Deleted

## 2020-02-14 DIAGNOSIS — Z8673 Personal history of transient ischemic attack (TIA), and cerebral infarction without residual deficits: Secondary | ICD-10-CM

## 2020-02-14 LAB — CUP PACEART REMOTE DEVICE CHECK
Date Time Interrogation Session: 20210311001645
Implantable Pulse Generator Implant Date: 20180918

## 2020-02-15 ENCOUNTER — Other Ambulatory Visit
Admission: RE | Admit: 2020-02-15 | Discharge: 2020-02-15 | Disposition: A | Discharge status: Home / Self Care | Payer: Medicare Other | Source: Ambulatory Visit | Attending: Gastroenterology | Admitting: Gastroenterology

## 2020-02-15 DIAGNOSIS — Z20822 Contact with and (suspected) exposure to covid-19: Secondary | ICD-10-CM | POA: Diagnosis not present

## 2020-02-15 DIAGNOSIS — Z01812 Encounter for preprocedural laboratory examination: Secondary | ICD-10-CM | POA: Diagnosis not present

## 2020-02-15 NOTE — Progress Notes (Signed)
ILR Remote 

## 2020-02-16 LAB — SARS CORONAVIRUS 2 (TAT 6-24 HRS): SARS Coronavirus 2: NEGATIVE

## 2020-02-18 ENCOUNTER — Telehealth: Payer: Self-pay | Admitting: Gastroenterology

## 2020-02-18 NOTE — Telephone Encounter (Signed)
Patient was contacted after 1pm today in regards to her diet this am.  She said she did eat a light breakfast consisting of 3 mini doughnuts and black coffee at 7 am this morning.  She was advised to start her clear liquid diet-as she has not eaten any solid foods since this.  She said she will go ahead and do clear liquids the remainder of the day and follow the instructions provided in her colonoscopy instructions.  Thanks,  [place], [place]

## 2020-02-18 NOTE — Telephone Encounter (Signed)
Pt left vm she has a procedure scheduled with Dr. Allen Norris 02/19/20 and ate a little bit of breakfast please call pt

## 2020-02-19 ENCOUNTER — Encounter: Payer: Self-pay | Admitting: Gastroenterology

## 2020-02-19 ENCOUNTER — Other Ambulatory Visit: Payer: Self-pay

## 2020-02-19 ENCOUNTER — Encounter
Admission: RE | Disposition: A | Discharge status: Home / Self Care | Payer: Self-pay | Source: Home / Self Care | Attending: Gastroenterology

## 2020-02-19 ENCOUNTER — Ambulatory Visit: Payer: Medicare Other | Admitting: Certified Registered Nurse Anesthetist

## 2020-02-19 ENCOUNTER — Ambulatory Visit
Admission: RE | Admit: 2020-02-19 | Discharge: 2020-02-19 | Disposition: A | Discharge status: Home / Self Care | Payer: Medicare Other | Attending: Gastroenterology | Admitting: Gastroenterology

## 2020-02-19 DIAGNOSIS — Z885 Allergy status to narcotic agent status: Secondary | ICD-10-CM | POA: Insufficient documentation

## 2020-02-19 DIAGNOSIS — Z7902 Long term (current) use of antithrombotics/antiplatelets: Secondary | ICD-10-CM | POA: Insufficient documentation

## 2020-02-19 DIAGNOSIS — F329 Major depressive disorder, single episode, unspecified: Secondary | ICD-10-CM | POA: Insufficient documentation

## 2020-02-19 DIAGNOSIS — M1990 Unspecified osteoarthritis, unspecified site: Secondary | ICD-10-CM | POA: Diagnosis not present

## 2020-02-19 DIAGNOSIS — Z888 Allergy status to other drugs, medicaments and biological substances status: Secondary | ICD-10-CM | POA: Diagnosis not present

## 2020-02-19 DIAGNOSIS — Q2733 Arteriovenous malformation of digestive system vessel: Secondary | ICD-10-CM | POA: Diagnosis not present

## 2020-02-19 DIAGNOSIS — K640 First degree hemorrhoids: Secondary | ICD-10-CM | POA: Diagnosis not present

## 2020-02-19 DIAGNOSIS — Z79899 Other long term (current) drug therapy: Secondary | ICD-10-CM | POA: Diagnosis not present

## 2020-02-19 DIAGNOSIS — K5520 Angiodysplasia of colon without hemorrhage: Secondary | ICD-10-CM | POA: Insufficient documentation

## 2020-02-19 DIAGNOSIS — Z87891 Personal history of nicotine dependence: Secondary | ICD-10-CM | POA: Diagnosis not present

## 2020-02-19 DIAGNOSIS — K5790 Diverticulosis of intestine, part unspecified, without perforation or abscess without bleeding: Secondary | ICD-10-CM | POA: Diagnosis not present

## 2020-02-19 DIAGNOSIS — E785 Hyperlipidemia, unspecified: Secondary | ICD-10-CM | POA: Insufficient documentation

## 2020-02-19 DIAGNOSIS — R195 Other fecal abnormalities: Secondary | ICD-10-CM

## 2020-02-19 DIAGNOSIS — K5730 Diverticulosis of large intestine without perforation or abscess without bleeding: Secondary | ICD-10-CM | POA: Diagnosis not present

## 2020-02-19 DIAGNOSIS — Z8673 Personal history of transient ischemic attack (TIA), and cerebral infarction without residual deficits: Secondary | ICD-10-CM | POA: Diagnosis not present

## 2020-02-19 DIAGNOSIS — Z886 Allergy status to analgesic agent status: Secondary | ICD-10-CM | POA: Insufficient documentation

## 2020-02-19 DIAGNOSIS — K219 Gastro-esophageal reflux disease without esophagitis: Secondary | ICD-10-CM | POA: Insufficient documentation

## 2020-02-19 HISTORY — PX: COLONOSCOPY WITH PROPOFOL: SHX5780

## 2020-02-19 SURGERY — COLONOSCOPY WITH PROPOFOL
Anesthesia: General

## 2020-02-19 MED ORDER — SODIUM CHLORIDE 0.9 % IV SOLN
INTRAVENOUS | Status: DC
  Administered 2020-02-19: 1000 mL via INTRAVENOUS

## 2020-02-19 MED ORDER — PROPOFOL 500 MG/50ML IV EMUL
INTRAVENOUS | Status: DC | PRN
  Administered 2020-02-19: 20 mg via INTRAVENOUS
  Administered 2020-02-19: 60 mg via INTRAVENOUS

## 2020-02-19 MED ORDER — PROPOFOL 500 MG/50ML IV EMUL
INTRAVENOUS | Status: DC | PRN
  Administered 2020-02-19: 150 ug/kg/min via INTRAVENOUS

## 2020-02-19 MED ORDER — PROPOFOL 500 MG/50ML IV EMUL
INTRAVENOUS | Status: AC
  Filled 2020-02-19: qty 50

## 2020-02-19 MED ORDER — LIDOCAINE HCL (CARDIAC) PF 100 MG/5ML IV SOSY
PREFILLED_SYRINGE | INTRAVENOUS | Status: DC | PRN
  Administered 2020-02-19: 50 mg via INTRAVENOUS

## 2020-02-19 NOTE — H&P (Signed)
Melody Lame, MD [facility] [address], [place], [place] [address] Phone:[phone] Fax : [phone]  Primary Care Physician:  Burnard Hawthorne, FNP Primary Gastroenterologist:  Dr. Allen Norris  Pre-Procedure History & Physical: HPI:  Melody Ochoa is a 79 y.o. female is here for an colonoscopy.   Past Medical History:  Diagnosis Date  . Arthritis   . Atheromatous plaque 10/18/2017  . Back pain   . Cancer ([place])    skin  . Depression   . Diverticulosis   . Dizziness    patient had episode of dizziness when came in room. hx past no dx  . GERD (gastroesophageal reflux disease)   . Heart murmur   . Hyperlipidemia 10/18/2017  . PONV (postoperative nausea and vomiting)    yrs ago  . Stroke [facility])     Past Surgical History:  Procedure Laterality Date  . BACK SURGERY  13  . BREAST BIOPSY Bilateral    cores "years ago"  . BREAST EXCISIONAL BIOPSY Left 90's  . CATARACT EXTRACTION W/PHACO Left 12/20/2018   Procedure: CATARACT EXTRACTION PHACO AND INTRAOCULAR LENS PLACEMENT ([place]) LEFT TOPICAL;  Surgeon: Leandrew Koyanagi, MD;  Location: [place];  Service: Ophthalmology;  Laterality: Left;  . CATARACT EXTRACTION W/PHACO Right 01/24/2019   Procedure: CATARACT EXTRACTION PHACO AND INTRAOCULAR LENS PLACEMENT ([place]) RIGHT;  Surgeon: Leandrew Koyanagi, MD;  Location: [place];  Service: Ophthalmology;  Laterality: Right;  . CERVICAL DISC SURGERY  09  . COLONOSCOPY WITH PROPOFOL N/A 01/13/2016   Procedure: COLONOSCOPY WITH PROPOFOL;  Surgeon: Melody Lame, MD;  Location: [facility] ENDOSCOPY;  Service: Endoscopy;  Laterality: N/A;  . FACELIFT  94  . LOOP RECORDER INSERTION N/A 08/23/2017   Procedure: LOOP RECORDER INSERTION;  Surgeon: Thompson Grayer, MD;  Location: [place] CV LAB;  Service: Cardiovascular;  Laterality: N/A;  . TEE WITHOUT CARDIOVERSION N/A 08/12/2017   Procedure: TRANSESOPHAGEAL ECHOCARDIOGRAM (TEE);  Surgeon: Sueanne Margarita, MD;  Location: [facility]  ENDOSCOPY;  Service: Cardiovascular;  Laterality: N/A;  . TONSILLECTOMY    . TOTAL HIP ARTHROPLASTY Right 04/22/2016   Procedure: TOTAL HIP ARTHROPLASTY;  Surgeon: Corky Mull, MD;  Location: [facility] ORS;  Service: Orthopedics;  Laterality: Right;    Prior to Admission medications   Medication Sig Start Date End Date Taking? Authorizing Provider  amLODipine (NORVASC) 2.5 MG tablet Take 1 tablet (2.5 mg total) by mouth daily as needed. If blood pressure greater than 140/90. 06/27/19  Yes Arnett, Yvetta Coder, FNP  atorvastatin (LIPITOR) 80 MG tablet Take 1 tablet (80 mg total) by mouth daily at 6 PM. 12/17/19  Yes Deboraha Sprang, MD  buPROPion (WELLBUTRIN XL) 300 MG 24 hr tablet Take 300 mg by mouth daily.  07/15/17  Yes [provider]  DULoxetine (CYMBALTA) 60 MG capsule Take 60 mg by mouth daily.    Yes [provider]  famotidine (PEPCID) 20 MG tablet Take 20 mg by mouth at bedtime.  11/07/18  Yes [provider]  pregabalin (LYRICA) 50 MG capsule 50 mg during day, 100 mg at night 02/07/20  Yes Lateef, Bilal, MD  albuterol (VENTOLIN HFA) 108 (90 Base) MCG/ACT inhaler TAKE 2 PUFFS BY MOUTH EVERY 6 HOURS AS NEEDED FOR WHEEZE OR SHORTNESS OF BREATH 09/04/19   Crecencio Mc, MD  clopidogrel (PLAVIX) 75 MG tablet TAKE 1 TABLET BY MOUTH EVERY DAY 01/22/20   Deboraha Sprang, MD  ferrous sulfate 325 (65 FE) MG tablet Take 325 mg by mouth daily  with breakfast.    [provider]  fluticasone (FLONASE) 50 MCG/ACT nasal spray Place 1 spray into both nostrils daily as needed for allergies or rhinitis (congestion).     [provider]  oxyCODONE-acetaminophen (PERCOCET/ROXICET) 5-325 MG tablet Take 1-2 tablets by mouth daily as needed for severe pain. 03/05/20 04/04/20  Gillis Santa, MD  oxyCODONE-acetaminophen (PERCOCET/ROXICET) 5-325 MG tablet Take 1-2 tablets by mouth daily as needed for severe pain. 04/04/20 05/04/20  Gillis Santa, MD  tiotropium (SPIRIVA) 18 MCG  inhalation capsule Place 1 capsule (18 mcg total) into inhaler and inhale daily for 30 days. Patient not taking: Reported on 02/06/2020 01/17/19 02/06/20  Laverle Hobby, MD  triamcinolone cream (KENALOG) 0.1 % Apply 1 application topically 2 (two) times daily as needed (rash/skin irritation.).  02/03/18   [provider]    Allergies as of 01/14/2020 - Review Complete 12/14/2019  Allergen Reaction Noted  . Aleve [naproxen sodium] Other (See Comments) 04/21/2016  . Aspirin Other (See Comments) 01/24/2016  . Celebrex [celecoxib] Other (See Comments) 04/21/2016  . Effexor [venlafaxine] Other (See Comments) 04/21/2016  . Gabapentin Other (See Comments) 06/01/2018  . Hydrocodone-homatropine Diarrhea 03/01/2016  . Ibuprofen Other (See Comments) 04/21/2016  . Tape Itching and Other (See Comments) 12/08/2016  . Vioxx [rofecoxib] Other (See Comments) 04/21/2016  . Other Rash and Other (See Comments) 11/13/2014  . Tramadol Other (See Comments) 09/29/2016    Family History  Problem Relation Age of Onset  . Lung cancer Father   . Aneurysm Brother   . Stroke Brother   . Diabetes Maternal Grandmother   . Kidney disease Maternal Grandfather   . Cushing syndrome Paternal Grandmother   . Dementia Paternal Grandfather   . Breast cancer Maternal Aunt 52    Social History   Socioeconomic History  . Marital status: Married    Spouse name: Not on file  . Number of children: Not on file  . Years of education: Not on file  . Highest education level: Not on file  Occupational History  . Not on file  Tobacco Use  . Smoking status: Former Smoker    Packs/day: 0.50    Years: 40.00    Pack years: 20.00    Types: Cigarettes    Quit date: 11/13/1996    Years since quitting: 23.2  . Smokeless tobacco: Never Used  Substance and Sexual Activity  . Alcohol use: Yes    Comment: occ wine  . Drug use: No  . Sexual activity: Not Currently  Other Topics Concern  . Not on file  Social  History Narrative  . Not on file   Social Determinants of Health   Financial Resource Strain:   . Difficulty of Paying Living Expenses:   Food Insecurity: No Food Insecurity  . Worried About Charity fundraiser in the Last Year: Never true  . Ran Out of Food in the Last Year: Never true  Transportation Needs:   . Lack of Transportation (Medical):   Marland Kitchen Lack of Transportation (Non-Medical):   Physical Activity: Unknown  . Days of Exercise per Week: 0 days  . Minutes of Exercise per Session: Not on file  Stress:   . Feeling of Stress :   Social Connections:   . Frequency of Communication with Friends and Family:   . Frequency of Social Gatherings with Friends and Family:   . Attends Religious Services:   . Active Member of Clubs or Organizations:   . Attends Archivist Meetings:   .  Marital Status:   Intimate Partner Violence:   . Fear of Current or Ex-Partner:   . Emotionally Abused:   Marland Kitchen Physically Abused:   . Sexually Abused:     Review of Systems: See HPI, otherwise negative ROS  Physical Exam: BP 133/68   Pulse 74   Temp 97.6 F (36.4 C) (Tympanic)   Resp 18   Ht 5\' 4"  (1.626 m)   Wt 88.5 kg   SpO2 98%   BMI 33.47 kg/m  General:   Alert,  pleasant and cooperative in NAD Head:  Normocephalic and atraumatic. Neck:  Supple; no masses or thyromegaly. Lungs:  Clear throughout to auscultation.    Heart:  Regular rate and rhythm. Abdomen:  Soft, nontender and nondistended. Normal bowel sounds, without guarding, and without rebound.   Neurologic:  Alert and  oriented x4;  grossly normal neurologically.  Impression/Plan: KASHA SHANES is here for an colonoscopy to be performed for heme positive stools  Risks, benefits, limitations, and alternatives regarding  colonoscopy have been reviewed with the patient.  Questions have been answered.  All parties agreeable.   Melody Lame, MD  02/19/2020, 9:23 AM

## 2020-02-19 NOTE — Op Note (Signed)
[facility] Patient Name: Melody Ochoa Procedure Date: 02/19/2020 9:29 AM MRN: [id number] Account #: [id number] Date of Birth: [birth year] Admit Type: Outpatient Age: 79 Room: [facility] ENDO ROOM 4 Gender: Female Note Status: Finalized Procedure:             Colonoscopy Indications:           Heme positive stool Providers:             [name] MD, MD Referring MD:          Yvetta Coder. Arnett (Referring MD) Medicines:             Propofol per Anesthesia Complications:         No immediate complications. Procedure:             Pre-Anesthesia Assessment:                        - Prior to the procedure, a History and Physical was                         performed, and patient medications and allergies were                         reviewed. The patient's tolerance of previous                         anesthesia was also reviewed. The risks and benefits                         of the procedure and the sedation options and risks                         were discussed with the patient. All questions were                         answered, and informed consent was obtained. Prior                         Anticoagulants: The patient has taken no previous                         anticoagulant or antiplatelet agents. ASA Grade                         Assessment: II - A patient with mild systemic disease.                         After reviewing the risks and benefits, the patient                         was deemed in satisfactory condition to undergo the                         procedure.                        After obtaining informed consent, the colonoscope was  passed under direct vision. Throughout the procedure,                         the patient's blood pressure, pulse, and oxygen                         saturations were monitored continuously. The                         Colonoscope was introduced through the anus and             advanced to the the cecum, identified by appendiceal                         orifice and ileocecal valve. The colonoscopy was                         performed without difficulty. The patient tolerated                         the procedure well. The quality of the bowel                         preparation was excellent. Findings:      The perianal and digital rectal examinations were normal.      A single medium-sized angiodysplastic lesion without bleeding was found       in the ascending colon. Coagulation for tissue destruction using argon       beam at 2 liters/minute and 30 watts was successful.      Multiple small-mouthed diverticula were found in the entire colon.      Non-bleeding internal hemorrhoids were found during retroflexion. The       hemorrhoids were Grade I (internal hemorrhoids that do not prolapse). Impression:            - A single non-bleeding colonic angiodysplastic                         lesion. Treated with argon beam coagulation.                        - Diverticulosis in the entire examined colon.                        - Non-bleeding internal hemorrhoids.                        - No specimens collected. Recommendation:        - Discharge patient to home.                        - Resume previous diet.                        - Continue present medications. Procedure Code(s):     --- Professional ---                        [phone], Colonoscopy, flexible; with ablation of  tumor(s), polyp(s), or other lesion(s) (includes pre-                         and post-dilation and guide wire passage, when                         performed) Diagnosis Code(s):     --- Professional ---                        R19.5, Other fecal abnormalities                        K55.20, Angiodysplasia of colon without hemorrhage CPT copyright 2019 American Medical Association. All rights reserved. The codes documented in this report are preliminary and upon  coder review may  be revised to meet current compliance requirements. [name] MD, MD 02/19/2020 9:57:45 AM This report has been signed electronically. Number of Addenda: 0 Note Initiated On: 02/19/2020 9:29 AM Scope Withdrawal Time: 0 hours 8 minutes 33 seconds  Total Procedure Duration: 0 hours 17 minutes 6 seconds  Estimated Blood Loss:  Estimated blood loss: none.      [facility]

## 2020-02-19 NOTE — Anesthesia Preprocedure Evaluation (Signed)
Anesthesia Evaluation  Patient identified by MRN, date of birth, ID band Patient awake  General Assessment Comment:Patient had a traumatic experience with colonoscopy long time ago, said she was awake during it and felt a lot of pain. Colonoscopies since then have been uneventful  Reviewed: Allergy & Precautions, NPO status , Patient's Chart, lab work & pertinent test results  History of Anesthesia Complications (+) PONV, AWARENESS UNDER ANESTHESIA and history of anesthetic complications  Airway Mallampati: II  TM Distance: >3 FB Neck ROM: Full    Dental  (+) Teeth Intact, Partial Lower, Partial Upper   Pulmonary neg sleep apnea, COPD, Patient abstained from smoking.Not current smoker, former smoker,    Pulmonary exam normal breath sounds clear to auscultation       Cardiovascular Exercise Tolerance: Good METS(-) hypertension(-) CAD and (-) Past MI negative cardio ROS  (-) dysrhythmias + Valvular Problems/Murmurs MR  Rhythm:Regular Rate:Normal - Systolic murmurs TTE [id number]: 1. The left ventricle has low normal systolic function, with an ejection  fraction of 50-55%. The cavity size was normal. Left ventricular diastolic  Doppler parameters are consistent with impaired relaxation. Unable to  exclude mild hypokinesis in teh  anteroseptal region.  2. The right ventricle has normal systolic function. The cavity was  normal. There is no increase in right ventricular wall thickness.Unable to  estimate RVSP  3. Left atrial size was mildly dilated.  4. Mitral valve regurgitation is mild to moderate    Loop recorder in place since TIA to evaluate for dysrythmias   Neuro/Psych PSYCHIATRIC DISORDERS Anxiety Depression TIA, only residual symptom is gravely voice  Neuromuscular disease CVA, No Residual Symptoms negative psych ROS   GI/Hepatic neg GERD  ,(+)     (-) substance abuse  ,   Endo/Other  neg diabetes  Renal/GU negative  Renal ROS     Musculoskeletal  (+) Arthritis , Osteoarthritis,  S/p cervical neck surgery   Abdominal   Peds  Hematology   Anesthesia Other Findings Past Medical History: No date: Arthritis 10/18/2017: Atheromatous plaque No date: Back pain No date: Cancer [facility])     Comment:  skin No date: Depression No date: Diverticulosis No date: Dizziness     Comment:  patient had episode of dizziness when came in room. hx               past no dx No date: GERD (gastroesophageal reflux disease) No date: Heart murmur 10/18/2017: Hyperlipidemia No date: PONV (postoperative nausea and vomiting)     Comment:  yrs ago No date: Stroke [facility])  Reproductive/Obstetrics                             Anesthesia Physical Anesthesia Plan  ASA: III  Anesthesia Plan: General   Post-op Pain Management:    Induction: Intravenous  PONV Risk Score and Plan: 3 and Ondansetron, Propofol infusion and TIVA  Airway Management Planned: Nasal Cannula  Additional Equipment: None  Intra-op Plan:   Post-operative Plan:   Informed Consent: I have reviewed the patients History and Physical, chart, labs and discussed the procedure including the risks, benefits and alternatives for the proposed anesthesia with the patient or authorized representative who has indicated his/her understanding and acceptance.     Dental advisory given  Plan Discussed with: CRNA and Surgeon  Anesthesia Plan Comments: (Discussed risks of anesthesia with patient, including possibility of difficulty with spontaneous ventilation under anesthesia necessitating airway intervention, PONV, and rare risks  such as cardiac or respiratory or neurological events. Patient understands.)        Anesthesia Quick Evaluation

## 2020-02-19 NOTE — Transfer of Care (Signed)
Immediate Anesthesia Transfer of Care Note  Patient: MAELEY SUESS  Procedure(s) Performed: COLONOSCOPY WITH PROPOFOL (N/A )  Patient Location: PACU  Anesthesia Type:General  Level of Consciousness: drowsy  Airway & Oxygen Therapy: Patient Spontanous Breathing and Patient connected to nasal cannula oxygen  Post-op Assessment: Report given to RN and Post -op Vital signs reviewed and stable  Post vital signs: Reviewed and stable  Last Vitals:  Vitals Value Taken Time  BP 112/67 02/19/20 1000  Temp 36.7 C 02/19/20 0959  Pulse 65 02/19/20 1000  Resp 12 02/19/20 1000  SpO2 96 % 02/19/20 1000  Vitals shown include unvalidated device data.  Last Pain:  Vitals:   02/19/20 0959  TempSrc: Tympanic  PainSc: Asleep         Complications: No apparent anesthesia complications

## 2020-02-19 NOTE — Anesthesia Postprocedure Evaluation (Signed)
Anesthesia Post Note  Patient: Melody Ochoa  Procedure(s) Performed: COLONOSCOPY WITH PROPOFOL (N/A )  Patient location during evaluation: Endoscopy Anesthesia Type: General Level of consciousness: awake and alert Pain management: pain level controlled Vital Signs Assessment: post-procedure vital signs reviewed and stable Respiratory status: spontaneous breathing, nonlabored ventilation, respiratory function stable and patient connected to nasal cannula oxygen Cardiovascular status: blood pressure returned to baseline and stable Postop Assessment: no apparent nausea or vomiting Anesthetic complications: no     Last Vitals:  Vitals:   02/19/20 0820 02/19/20 0959  BP: 133/68 112/67  Pulse: 74 63  Resp: 18 12  Temp: 36.4 C 36.7 C  SpO2: 98% 96%    Last Pain:  Vitals:   02/19/20 1010  TempSrc:   PainSc: 0-No pain                 Arita Miss

## 2020-02-20 ENCOUNTER — Encounter: Payer: Self-pay | Admitting: *Deleted

## 2020-02-27 ENCOUNTER — Telehealth: Payer: Self-pay | Admitting: Family

## 2020-02-27 NOTE — Telephone Encounter (Signed)
FYI

## 2020-02-27 NOTE — Telephone Encounter (Signed)
Patient made an appointment with Dr. Vella Kohler at [facility]. She did not want to travel to [place].

## 2020-02-27 NOTE — Telephone Encounter (Signed)
noted 

## 2020-03-07 ENCOUNTER — Other Ambulatory Visit: Payer: Medicare Other

## 2020-03-11 ENCOUNTER — Emergency Department: Payer: Medicare Other

## 2020-03-11 ENCOUNTER — Emergency Department
Admission: EM | Admit: 2020-03-11 | Discharge: 2020-03-11 | Disposition: A | Discharge status: Home / Self Care | Payer: Medicare Other | Attending: Emergency Medicine | Admitting: Emergency Medicine

## 2020-03-11 ENCOUNTER — Other Ambulatory Visit: Payer: Self-pay

## 2020-03-11 DIAGNOSIS — R0789 Other chest pain: Secondary | ICD-10-CM | POA: Insufficient documentation

## 2020-03-11 DIAGNOSIS — Z96641 Presence of right artificial hip joint: Secondary | ICD-10-CM | POA: Diagnosis not present

## 2020-03-11 DIAGNOSIS — Z87891 Personal history of nicotine dependence: Secondary | ICD-10-CM | POA: Diagnosis not present

## 2020-03-11 DIAGNOSIS — Z85828 Personal history of other malignant neoplasm of skin: Secondary | ICD-10-CM | POA: Diagnosis not present

## 2020-03-11 DIAGNOSIS — R61 Generalized hyperhidrosis: Secondary | ICD-10-CM | POA: Diagnosis not present

## 2020-03-11 DIAGNOSIS — R079 Chest pain, unspecified: Secondary | ICD-10-CM | POA: Diagnosis not present

## 2020-03-11 LAB — CBC
HCT: 34.7 % — ABNORMAL LOW (ref 36.0–46.0)
Hemoglobin: 11.2 g/dL — ABNORMAL LOW (ref 12.0–15.0)
MCH: 28.4 pg (ref 26.0–34.0)
MCHC: 32.3 g/dL (ref 30.0–36.0)
MCV: 88.1 fL (ref 80.0–100.0)
Platelets: 220 10*3/uL (ref 150–400)
RBC: 3.94 MIL/uL (ref 3.87–5.11)
RDW: 13.9 % (ref 11.5–15.5)
WBC: 6.5 10*3/uL (ref 4.0–10.5)
nRBC: 0 % (ref 0.0–0.2)

## 2020-03-11 LAB — BASIC METABOLIC PANEL
Anion gap: 9 (ref 5–15)
BUN: 18 mg/dL (ref 8–23)
CO2: 25 mmol/L (ref 22–32)
Calcium: 9 mg/dL (ref 8.9–10.3)
Chloride: 107 mmol/L (ref 98–111)
Creatinine, Ser: 1.04 mg/dL — ABNORMAL HIGH (ref 0.44–1.00)
GFR calc Af Amer: 59 mL/min — ABNORMAL LOW (ref >60)
GFR calc non Af Amer: 51 mL/min — ABNORMAL LOW (ref >60)
Glucose, Bld: 108 mg/dL — ABNORMAL HIGH (ref 70–99)
Potassium: 4 mmol/L (ref 3.5–5.1)
Sodium: 141 mmol/L (ref 135–145)

## 2020-03-11 LAB — TROPONIN I (HIGH SENSITIVITY)
Troponin I (High Sensitivity): 8 ng/L (ref <18)
Troponin I (High Sensitivity): 8 ng/L (ref <18)

## 2020-03-11 MED ORDER — SODIUM CHLORIDE 0.9% FLUSH: 3.0000 mL | Freq: Once | INTRAVENOUS | Status: DC

## 2020-03-11 NOTE — ED Provider Notes (Signed)
[facility] Emergency Department Provider Note       Time seen: ----------------------------------------- 10:59 AM on 03/11/2020 -----------------------------------------   I have reviewed the triage vital signs and the nursing notes.  HISTORY   Chief Complaint Chest Pain   HPI Melody Ochoa is a 79 y.o. female with a history of arthritis, back pain, skin cancer, depression, dizziness, GERD, hyperlipidemia, CVA who presents to the ED for burning substernal chest pain at 4 AM this morning and now with tightness.  Patient states she broke out into a sweat on the way here.  Denies shortness of breath.  Discomfort is 6 out of 10.  Past Medical History:  Diagnosis Date  . Arthritis   . Atheromatous plaque 10/18/2017  . Back pain   . Cancer ([place])    skin  . Depression   . Diverticulosis   . Dizziness    patient had episode of dizziness when came in room. hx past no dx  . GERD (gastroesophageal reflux disease)   . Heart murmur   . Hyperlipidemia 10/18/2017  . PONV (postoperative nausea and vomiting)    yrs ago  . Stroke [facility])     Patient Active Problem List   Diagnosis Date Noted  . Positive fecal immunochemical test   . Angiodysplasia of intestinal tract   . Transient ischemic attack (TIA) 10/08/2019  . Transient right leg weakness 09/30/2019  . Peripheral neuropathy 08/10/2019  . Primary osteoarthritis of left shoulder 07/24/2019  . Elevated blood pressure reading 06/27/2019  . Left shoulder pain 05/04/2019  . Anemia 03/19/2019  . Long term current use of opiate analgesic 01/09/2019  . Foraminal stenosis of lumbar region 11/14/2018  . Chronic pain syndrome 09/12/2018  . SOB (shortness of breath) on exertion 08/11/2018  . Dysphagia 08/11/2018  . Cough 06/30/2018  . Rash 01/16/2018  . Hyperlipidemia 10/18/2017  . History of lumbar surgery 10/04/2017  . Depression, recurrent ([place]) 09/27/2017  . Pulmonary nodules 09/27/2017  . History of  stroke 09/12/2017  . Stroke (cerebrum) ([place]) 08/10/2017  . Primary osteoarthritis of left hip 02/28/2017  . Strain of left hip 02/28/2017  . B12 deficiency 12/21/2016  . Generalized OA 05/13/2016  . Status post total replacement of right hip 04/22/2016  . Personal history of colonic polyps   . Greater trochanteric bursitis of both hips 01/09/2016  . Prediabetes 08/04/2015  . Thoracic aortic atherosclerosis ([place]) 06/16/2015  . 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  . UTI (lower urinary tract infection) 11/22/2014  . Low back pain 11/22/2014  . Spondylolisthesis of lumbar region 11/18/2014  . Lumbar radiculopathy 08/21/2014  . Degeneration of lumbar or lumbosacral intervertebral disc 08/21/2014  . Left hip pain 08/21/2014  . Osteopenia 06/23/2014  . Major depression in remission ([place]) 06/23/2014  . Anxiety 06/23/2014  . Menopause 06/23/2014    Past Surgical History:  Procedure Laterality Date  . BACK SURGERY  13  . BREAST BIOPSY Bilateral    cores "years ago"  . BREAST EXCISIONAL BIOPSY Left 90's  . CATARACT EXTRACTION W/PHACO Left 12/20/2018   Procedure: CATARACT EXTRACTION PHACO AND INTRAOCULAR LENS PLACEMENT ([place]) LEFT TOPICAL;  Surgeon: Leandrew Koyanagi, MD;  Location: [place];  Service: Ophthalmology;  Laterality: Left;  . CATARACT EXTRACTION W/PHACO Right 01/24/2019   Procedure: CATARACT EXTRACTION PHACO AND INTRAOCULAR LENS PLACEMENT ([place]) RIGHT;  Surgeon: Leandrew Koyanagi, MD;  Location: [place];  Service: Ophthalmology;  Laterality: Right;  . CERVICAL DISC SURGERY  09  . COLONOSCOPY WITH PROPOFOL  N/A 01/13/2016   Procedure: COLONOSCOPY WITH PROPOFOL;  Surgeon: [name], MD;  Location: [facility] ENDOSCOPY;  Service: Endoscopy;  Laterality: N/A;  . COLONOSCOPY WITH PROPOFOL N/A 02/19/2020   Procedure: COLONOSCOPY WITH PROPOFOL;  Surgeon: [name], MD;  Location: [facility] ENDOSCOPY;  Service: Gastroenterology;  Laterality: N/A;  . FACELIFT  94   . LOOP RECORDER INSERTION N/A 08/23/2017   Procedure: LOOP RECORDER INSERTION;  Surgeon: Thompson Grayer, MD;  Location: [place] CV LAB;  Service: Cardiovascular;  Laterality: N/A;  . TEE WITHOUT CARDIOVERSION N/A 08/12/2017   Procedure: TRANSESOPHAGEAL ECHOCARDIOGRAM (TEE);  Surgeon: Sueanne Margarita, MD;  Location: [facility] ENDOSCOPY;  Service: Cardiovascular;  Laterality: N/A;  . TONSILLECTOMY    . TOTAL HIP ARTHROPLASTY Right 04/22/2016   Procedure: TOTAL HIP ARTHROPLASTY;  Surgeon: Corky Mull, MD;  Location: [facility] ORS;  Service: Orthopedics;  Laterality: Right;    Allergies Aleve [naproxen sodium], Aspirin, Celebrex [celecoxib], Effexor [venlafaxine], Gabapentin, Hydrocodone-homatropine, Ibuprofen, Tape, Vioxx [rofecoxib], Other, and Tramadol  Social History Social History   Tobacco Use  . Smoking status: Former Smoker    Packs/day: 0.50    Years: 40.00    Pack years: 20.00    Types: Cigarettes    Quit date: 11/13/1996    Years since quitting: 23.3  . Smokeless tobacco: Never Used  Substance Use Topics  . Alcohol use: Yes    Comment: occ wine  . Drug use: No    Review of Systems Constitutional: Negative for fever. Cardiovascular: Positive for chest pain Respiratory: Negative for shortness of breath. Gastrointestinal: Negative for abdominal pain, vomiting and diarrhea. Musculoskeletal: Negative for back pain. Skin: Positive for diaphoresis Neurological: Negative for headaches, focal weakness or numbness.  All systems negative/normal/unremarkable except as stated in the HPI  ____________________________________________   PHYSICAL EXAM:  VITAL SIGNS: ED Triage Vitals  Enc Vitals Group     BP 03/11/20 0930 131/69     Pulse Rate 03/11/20 0930 79     Resp 03/11/20 0930 17     Temp 03/11/20 0930 98.1 F (36.7 C)     Temp Source 03/11/20 0930 Oral     SpO2 03/11/20 0930 100 %     Weight 03/11/20 0927 195 lb (88.5 kg)     Height 03/11/20 0927 5\' 4"  (1.626 m)     Head  Circumference --      Peak Flow --      Pain Score 03/11/20 0927 6     Pain Loc --      Pain Edu? --      Excl. in [place]? --     Constitutional: Alert and oriented. Well appearing and in no distress. Eyes: Conjunctivae are normal. Normal extraocular movements. ENT      Head: Normocephalic and atraumatic.      Nose: No congestion/rhinnorhea.      Mouth/Throat: Mucous membranes are moist.      Neck: No stridor. Cardiovascular: Normal rate, regular rhythm. No murmurs, rubs, or gallops. Respiratory: Normal respiratory effort without tachypnea nor retractions. Breath sounds are clear and equal bilaterally. No wheezes/rales/rhonchi. Gastrointestinal: Soft and nontender. Normal bowel sounds Musculoskeletal: Nontender with normal range of motion in extremities. No lower extremity tenderness nor edema. Neurologic:  Normal speech and language. No gross focal neurologic deficits are appreciated.  Skin:  Skin is warm, dry and intact. No rash noted. Psychiatric: Mood and affect are normal. Speech and behavior are normal.  ____________________________________________  EKG: Interpreted by me.  Sinus rhythm with a rate of  81 bpm, possible LVH, anterior infarct age-indeterminate, normal QT  ____________________________________________  ED COURSE:  As part of my medical decision making, I reviewed the following data within the [place] History obtained from family if available, nursing notes, old chart and ekg, as well as notes from prior ED visits. Patient presented for chest pain, we will assess with labs and imaging as indicated at this time.   Procedures  BAYLIE STARLIPER was evaluated in Emergency Department on 03/11/2020 for the symptoms described in the history of present illness. She was evaluated in the context of the global COVID-19 pandemic, which necessitated consideration that the patient might be at risk for infection with the SARS-CoV-2 virus that causes COVID-19.  Institutional protocols and algorithms that pertain to the evaluation of patients at risk for COVID-19 are in a state of rapid change based on information released by regulatory bodies including the CDC and federal and state organizations. These policies and algorithms were followed during the patient's care in the ED.  ____________________________________________   LABS (pertinent positives/negatives)  Labs Reviewed  BASIC METABOLIC PANEL - Abnormal; Notable for the following components:      Result Value   Glucose, Bld 108 (*)    Creatinine, Ser 1.04 (*)    GFR calc non Af Amer 51 (*)    GFR calc Af Amer 59 (*)    All other components within normal limits  CBC - Abnormal; Notable for the following components:   Hemoglobin 11.2 (*)    HCT 34.7 (*)    All other components within normal limits  TROPONIN I (HIGH SENSITIVITY)  TROPONIN I (HIGH SENSITIVITY)    RADIOLOGY Images were viewed by me  Chest x-ray IMPRESSION: No acute cardiopulmonary abnormalities.  Chronic fibrotic lung disease. ____________________________________________   DIFFERENTIAL DIAGNOSIS   Musculoskeletal pain, GERD, unstable angina, MI, PE, pneumothorax  FINAL ASSESSMENT AND PLAN  Chest pain   Plan: The patient had presented for chest pain. Patient's labs initially were unremarkable. Patient's imaging did not reveal any acute process.  Repeat troponin was negative.  Should be encouraged to have close outpatient follow-up with cardiology.   Laurence Aly, MD    Note: This note was generated in part or whole with voice recognition software. Voice recognition is usually quite accurate but there are transcription errors that can and very often do occur. I apologize for any typographical errors that were not detected and corrected.     Earleen Newport, MD 03/11/20 1227

## 2020-03-11 NOTE — ED Notes (Addendum)
Pt states history of COPD; denies inc SOB; reports last night around 4am she woke d/t sudden burning CP radiating to R arm (sharp); reports diaphoresis at given time. Skin dry now; in NAD; pt alert and relaxed; resp reg/unlabored. Denies anxiety. Pt given warm blanket. Bed locked low. Rail up. Call bell within reach.

## 2020-03-11 NOTE — ED Triage Notes (Signed)
Pt c/o waking up with burning substernal chest pain at 4am this morning and now having tightness, states she broke out into a sweat on the way here. Pt is in NAD at present. Denies SOB, skin is warm and dry, respirations WNL. Pt does have a hx of stroke .

## 2020-03-11 NOTE — ED Notes (Signed)
Pt resting calmly in bed.  

## 2020-03-11 NOTE — ED Notes (Signed)
[place] froze up and wouldn't print paperwork for pt to sign. Pt educated thoroughly.

## 2020-03-11 NOTE — ED Notes (Signed)
Pt wheeled out to lobby.  

## 2020-03-17 ENCOUNTER — Ambulatory Visit (INDEPENDENT_AMBULATORY_CARE_PROVIDER_SITE_OTHER): Payer: Medicare Other | Admitting: *Deleted

## 2020-03-17 DIAGNOSIS — Z8673 Personal history of transient ischemic attack (TIA), and cerebral infarction without residual deficits: Secondary | ICD-10-CM | POA: Diagnosis not present

## 2020-03-17 LAB — CUP PACEART REMOTE DEVICE CHECK
Date Time Interrogation Session: 20210411032938
Implantable Pulse Generator Implant Date: 20180918

## 2020-03-18 NOTE — Progress Notes (Signed)
ILR Remote 

## 2020-03-24 ENCOUNTER — Ambulatory Visit: Payer: Medicare Other | Admitting: Dermatology

## 2020-04-09 ENCOUNTER — Ambulatory Visit: Payer: Medicare Other | Admitting: Family

## 2020-04-17 LAB — CUP PACEART REMOTE DEVICE CHECK
Date Time Interrogation Session: 20210512032248
Implantable Pulse Generator Implant Date: 20180918

## 2020-04-21 ENCOUNTER — Ambulatory Visit (INDEPENDENT_AMBULATORY_CARE_PROVIDER_SITE_OTHER): Payer: Medicare Other | Admitting: *Deleted

## 2020-04-21 DIAGNOSIS — I63219 Cerebral infarction due to unspecified occlusion or stenosis of unspecified vertebral arteries: Secondary | ICD-10-CM

## 2020-04-22 ENCOUNTER — Other Ambulatory Visit: Payer: Self-pay

## 2020-04-22 ENCOUNTER — Encounter: Payer: Self-pay | Admitting: Family

## 2020-04-22 ENCOUNTER — Ambulatory Visit (INDEPENDENT_AMBULATORY_CARE_PROVIDER_SITE_OTHER): Payer: Medicare Other | Admitting: Family

## 2020-04-22 VITALS — BP 136/72 | HR 86 | Temp 95.7°F | Ht 64.5 in | Wt 207.0 lb

## 2020-04-22 DIAGNOSIS — M25512 Pain in left shoulder: Secondary | ICD-10-CM | POA: Diagnosis not present

## 2020-04-22 DIAGNOSIS — Z780 Asymptomatic menopausal state: Secondary | ICD-10-CM | POA: Diagnosis not present

## 2020-04-22 DIAGNOSIS — M19012 Primary osteoarthritis, left shoulder: Secondary | ICD-10-CM | POA: Diagnosis not present

## 2020-04-22 DIAGNOSIS — I10 Essential (primary) hypertension: Secondary | ICD-10-CM | POA: Diagnosis not present

## 2020-04-22 LAB — COMPREHENSIVE METABOLIC PANEL
ALT: 13 U/L (ref 0–35)
AST: 17 U/L (ref 0–37)
Albumin: 4 g/dL (ref 3.5–5.2)
Alkaline Phosphatase: 62 U/L (ref 39–117)
BUN: 16 mg/dL (ref 6–23)
CO2: 28 mEq/L (ref 19–32)
Calcium: 8.9 mg/dL (ref 8.4–10.5)
Chloride: 105 mEq/L (ref 96–112)
Creatinine, Ser: 1.18 mg/dL (ref 0.40–1.20)
GFR: 44.08 mL/min — ABNORMAL LOW (ref >60.00)
Glucose, Bld: 112 mg/dL — ABNORMAL HIGH (ref 70–99)
Potassium: 4 mEq/L (ref 3.5–5.1)
Sodium: 138 mEq/L (ref 135–145)
Total Bilirubin: 0.4 mg/dL (ref 0.2–1.2)
Total Protein: 7 g/dL (ref 6.0–8.3)

## 2020-04-22 LAB — CBC WITH DIFFERENTIAL/PLATELET
Basophils Absolute: 0.1 10*3/uL (ref 0.0–0.1)
Basophils Relative: 1.1 % (ref 0.0–3.0)
Eosinophils Absolute: 0.3 10*3/uL (ref 0.0–0.7)
Eosinophils Relative: 4.4 % (ref 0.0–5.0)
HCT: 33.3 % — ABNORMAL LOW (ref 36.0–46.0)
Hemoglobin: 11.1 g/dL — ABNORMAL LOW (ref 12.0–15.0)
Lymphocytes Relative: 14.7 % (ref 12.0–46.0)
Lymphs Abs: 1 10*3/uL (ref 0.7–4.0)
MCHC: 33.3 g/dL (ref 30.0–36.0)
MCV: 86.5 fl (ref 78.0–100.0)
Monocytes Absolute: 0.6 10*3/uL (ref 0.1–1.0)
Monocytes Relative: 8.1 % (ref 3.0–12.0)
Neutro Abs: 4.9 10*3/uL (ref 1.4–7.7)
Neutrophils Relative %: 71.7 % (ref 43.0–77.0)
Platelets: 271 10*3/uL (ref 150.0–400.0)
RBC: 3.85 Mil/uL — ABNORMAL LOW (ref 3.87–5.11)
RDW: 14.5 % (ref 11.5–15.5)
WBC: 6.9 10*3/uL (ref 4.0–10.5)

## 2020-04-22 LAB — HEMOGLOBIN A1C: Hgb A1c MFr Bld: 5.4 % (ref 4.6–6.5)

## 2020-04-22 NOTE — Patient Instructions (Signed)
Referral to orthopedics  Let us know if you dont hear back within a week in regards to an appointment being scheduled.   Please call  and schedule your 3D mammogram, bone density scan as discussed.   [place]  [place], [place]

## 2020-04-22 NOTE — Progress Notes (Signed)
Carelink Summary Report / Loop Recorder 

## 2020-04-22 NOTE — Progress Notes (Signed)
Subjective:    Patient ID: Melody Ochoa, female    DOB: Aug 12, 1941, 79 y.o.   MRN: [id number]  CC: Melody Ochoa is a 79 y.o. female who presents today for follow up  HPI: Overall well today No new complaints.  Last fall 3 months ago.   HTN- takes amlodipine as needed. Not checking blood pressure.  Has not had any further episodes of CP since 03/11/29.  Denies numbness or tingling radiating to left arm or jaw, palpitations, dizziness, frequent headaches, changes in vision, or shortness of breath.   H/o CVA- h/o TIA hospital admission 10/20  Peripheral neuropathy - chronic. Noticed in bilateral feet. Unchanged.   Chronic back and bilateral shoulder pain, worse in left arm- follows with Dr Holley Raring  No longer follows with Dr Cari Caraway     Dr Allen Norris -positive FIT test; UTD colonoscopy 02/2020  Dr Raymondo Band with him for loop recorder.   Saw Dr Marlou Sa 05/23/19 after xray of left shoulder done in our office for  Left shoulder pain.   Due dexa and mammogram  HISTORY:  Past Medical History:  Diagnosis Date  . Arthritis   . Atheromatous plaque 10/18/2017  . Back pain   . Cancer ([place])    skin  . Depression   . Diverticulosis   . Dizziness    patient had episode of dizziness when came in room. hx past no dx  . GERD (gastroesophageal reflux disease)   . Heart murmur   . Hyperlipidemia 10/18/2017  . PONV (postoperative nausea and vomiting)    yrs ago  . Stroke [facility])    Past Surgical History:  Procedure Laterality Date  . BACK SURGERY  13  . BREAST BIOPSY Bilateral    cores "years ago"  . BREAST EXCISIONAL BIOPSY Left 90's  . CATARACT EXTRACTION W/PHACO Left 12/20/2018   Procedure: CATARACT EXTRACTION PHACO AND INTRAOCULAR LENS PLACEMENT ([place]) LEFT TOPICAL;  Surgeon: Leandrew Koyanagi, MD;  Location: [place];  Service: Ophthalmology;  Laterality: Left;  . CATARACT EXTRACTION W/PHACO Right 01/24/2019   Procedure: CATARACT EXTRACTION PHACO AND INTRAOCULAR  LENS PLACEMENT ([place]) RIGHT;  Surgeon: Leandrew Koyanagi, MD;  Location: [place];  Service: Ophthalmology;  Laterality: Right;  . CERVICAL DISC SURGERY  09  . COLONOSCOPY WITH PROPOFOL N/A 01/13/2016   Procedure: COLONOSCOPY WITH PROPOFOL;  Surgeon: [name], MD;  Location: [facility] ENDOSCOPY;  Service: Endoscopy;  Laterality: N/A;  . COLONOSCOPY WITH PROPOFOL N/A 02/19/2020   Procedure: COLONOSCOPY WITH PROPOFOL;  Surgeon: [name], MD;  [facility];  Service: Gastroenterology;  Laterality: N/A;  . FACELIFT  94  . LOOP RECORDER INSERTION N/A 08/23/2017   Procedure: LOOP RECORDER INSERTION;  Surgeon: Thompson Grayer, MD;  Location: [place] [facility];  Service: Cardiovascular;  Laterality: N/A;  . TEE WITHOUT CARDIOVERSION N/A 08/12/2017   Procedure: TRANSESOPHAGEAL ECHOCARDIOGRAM (TEE);  Surgeon: Sueanne Margarita, MD;  Location: [facility] ENDOSCOPY;  Service: Cardiovascular;  Laterality: N/A;  . TONSILLECTOMY    . TOTAL HIP ARTHROPLASTY Right 04/22/2016   Procedure: TOTAL HIP ARTHROPLASTY;  Surgeon: Corky Mull, MD;  Location: [facility] ORS;  Service: Orthopedics;  Laterality: Right;   Family History  Problem Relation Age of Onset  . Lung cancer Father   . Aneurysm Brother   . Stroke Brother   . Diabetes Maternal Grandmother   . Kidney disease Maternal Grandfather   . Cushing syndrome Paternal Grandmother   . Dementia Paternal Grandfather   . Breast cancer Maternal Aunt 59  Allergies: Aleve [naproxen sodium], Aspirin, Celebrex [celecoxib], Effexor [venlafaxine], Gabapentin, Hydrocodone-homatropine, Ibuprofen, Tape, Vioxx [rofecoxib], Other, and Tramadol Current Outpatient Medications on File Prior to Visit  Medication Sig Dispense Refill  . albuterol (VENTOLIN HFA) 108 (90 Base) MCG/ACT inhaler TAKE 2 PUFFS BY MOUTH EVERY 6 HOURS AS NEEDED FOR WHEEZE OR SHORTNESS OF BREATH 18 g 2  . atorvastatin (LIPITOR) 80 MG tablet Take 1 tablet (80 mg total) by mouth daily at 6 PM. 90  tablet 0  . buPROPion (WELLBUTRIN XL) 300 MG 24 hr tablet Take 300 mg by mouth daily.     . clopidogrel (PLAVIX) 75 MG tablet TAKE 1 TABLET BY MOUTH EVERY DAY 90 tablet 0  . DULoxetine (CYMBALTA) 60 MG capsule Take 60 mg by mouth daily.     . famotidine (PEPCID) 20 MG tablet Take 20 mg by mouth at bedtime.   12  . oxyCODONE-acetaminophen (PERCOCET/ROXICET) 5-325 MG tablet Take 1-2 tablets by mouth daily as needed for severe pain. 45 tablet 0  . pregabalin (LYRICA) 50 MG capsule 50 mg during day, 100 mg at night 90 capsule 3  . amLODipine (NORVASC) 2.5 MG tablet Take 1 tablet (2.5 mg total) by mouth daily as needed. If blood pressure greater than 140/90. (Patient not taking: Reported on 04/22/2020) 90 tablet 3  . fluticasone (FLONASE) 50 MCG/ACT nasal spray Place 1 spray into both nostrils daily as needed for allergies or rhinitis (congestion).      No current facility-administered medications on file prior to visit.    Social History   Tobacco Use  . Smoking status: Former Smoker    Packs/day: 0.50    Years: 40.00    Pack years: 20.00    Types: Cigarettes    Quit date: 11/13/1996    Years since quitting: 23.4  . Smokeless tobacco: Never Used  Substance Use Topics  . Alcohol use: Yes    Comment: occ wine  . Drug use: No    Review of Systems  Constitutional: Negative for chills and fever.  Respiratory: Negative for cough.   Cardiovascular: Negative for chest pain and palpitations.  Gastrointestinal: Negative for nausea and vomiting.  Musculoskeletal: Positive for arthralgias and back pain.  Neurological: Positive for numbness.      Objective:    BP 136/72   Pulse 86   Temp (!) 95.7 F (35.4 C) (Temporal)   Ht 5' 4.5" (1.638 m)   Wt 207 lb (93.9 kg)   SpO2 97%   BMI 34.98 kg/m    Physical Exam Vitals reviewed.  Constitutional:      Appearance: She is well-developed.  Eyes:     Conjunctiva/sclera: Conjunctivae normal.  Cardiovascular:     Rate and Rhythm: Normal  rate and regular rhythm.     Pulses: Normal pulses.     Heart sounds: Normal heart sounds.  Pulmonary:     Effort: Pulmonary effort is normal.     Breath sounds: Normal breath sounds. No wheezing, rhonchi or rales.  Musculoskeletal:     Left shoulder: Tenderness present. No bony tenderness. Decreased range of motion. Normal strength. Normal pulse.     Comments: Left Shoulder:   No asymmetry of shoulders when comparing right and left.No pain with palpation over glenohumeral joint lines, [place] joint, AC joint, or bicipital groove. Exam limited by severely limited range of motion. Pain with internal and external rotation.   No pain, swelling, or ecchymosis noted over long head of biceps.    Strength and sensation normal BUE's.  Skin:    General: Skin is warm and dry.  Neurological:     Mental Status: She is alert.  Psychiatric:        Speech: Speech normal.        Behavior: Behavior normal.        Thought Content: Thought content normal.        Assessment & Plan:   Problem List Items Addressed This Visit      Cardiovascular and Mediastinum   HTN (hypertension)    Stable, continue current regimen.   no further episodes of chest pain.She understands to make follow up with Dr Caryl Comes and will call his office.         Musculoskeletal and Integument   Primary osteoarthritis of left shoulder - Primary   Relevant Orders   CBC with Differential/Platelet (Completed)   Comprehensive metabolic panel (Completed)   Hemoglobin A1c (Completed)   Ambulatory referral to Orthopedic Surgery     Other   Left shoulder pain    Chronic, severely limited range of motion.  Patient would like orthopedic closer in town. Referral placed. Will follow.      Menopause   Relevant Orders   MM 3D SCREEN BREAST BILATERAL   DG Bone Density     Of note, patient will schedule dexa, mammogram   I have discontinued Beverley Fiedler [place]'s triamcinolone cream, tiotropium, and ferrous sulfate. I am also  having her maintain her DULoxetine, buPROPion, famotidine, fluticasone, amLODipine, albuterol, atorvastatin, clopidogrel, oxyCODONE-acetaminophen, and pregabalin.   No orders of the defined types were placed in this encounter.   Return precautions given.   Risks, benefits, and alternatives of the medications and treatment plan prescribed today were discussed, and patient expressed understanding.   Education regarding symptom management and diagnosis given to patient on AVS.  Continue to follow with Burnard Hawthorne, FNP for routine health maintenance.   [name] and I agreed with plan.   [name], FNP

## 2020-04-23 ENCOUNTER — Telehealth: Payer: Self-pay

## 2020-04-23 ENCOUNTER — Other Ambulatory Visit: Payer: Self-pay | Admitting: Family

## 2020-04-23 DIAGNOSIS — I10 Essential (primary) hypertension: Secondary | ICD-10-CM | POA: Insufficient documentation

## 2020-04-23 DIAGNOSIS — N189 Chronic kidney disease, unspecified: Secondary | ICD-10-CM

## 2020-04-23 NOTE — Telephone Encounter (Signed)
I called patient for labs, but could not reach at either number.

## 2020-04-23 NOTE — Assessment & Plan Note (Signed)
Chronic, severely limited range of motion.  Patient would like orthopedic closer in town. Referral placed. Will follow.

## 2020-04-23 NOTE — Assessment & Plan Note (Signed)
Stable, continue current regimen.   no further episodes of chest pain.She understands to make follow up with Dr Caryl Comes and will call his office.

## 2020-04-24 NOTE — Telephone Encounter (Signed)
Returned patient's call for labs.

## 2020-04-24 NOTE — Telephone Encounter (Signed)
Pt called returning your call 

## 2020-04-27 ENCOUNTER — Other Ambulatory Visit: Payer: Self-pay | Admitting: Internal Medicine

## 2020-04-29 NOTE — Telephone Encounter (Signed)
Please schedule overdue F/U appointment with Dr. Caryl Comes. Thank you!

## 2020-04-29 NOTE — Telephone Encounter (Signed)
Called patient, unable to leave message due to no voicemail being setup

## 2020-05-08 ENCOUNTER — Other Ambulatory Visit: Payer: Self-pay

## 2020-05-08 ENCOUNTER — Encounter: Payer: Self-pay | Admitting: Student in an Organized Health Care Education/Training Program

## 2020-05-08 ENCOUNTER — Ambulatory Visit
Payer: Medicare Other | Attending: Student in an Organized Health Care Education/Training Program | Admitting: Student in an Organized Health Care Education/Training Program

## 2020-05-08 ENCOUNTER — Encounter: Payer: Medicare Other | Admitting: Student in an Organized Health Care Education/Training Program

## 2020-05-08 VITALS — BP 124/71 | HR 87 | Temp 98.7°F | Resp 18 | Ht 64.5 in | Wt 207.0 lb

## 2020-05-08 DIAGNOSIS — M1612 Unilateral primary osteoarthritis, left hip: Secondary | ICD-10-CM

## 2020-05-08 DIAGNOSIS — M5416 Radiculopathy, lumbar region: Secondary | ICD-10-CM

## 2020-05-08 DIAGNOSIS — M19012 Primary osteoarthritis, left shoulder: Secondary | ICD-10-CM | POA: Diagnosis not present

## 2020-05-08 DIAGNOSIS — M961 Postlaminectomy syndrome, not elsewhere classified: Secondary | ICD-10-CM | POA: Diagnosis not present

## 2020-05-08 DIAGNOSIS — G8929 Other chronic pain: Secondary | ICD-10-CM | POA: Diagnosis not present

## 2020-05-08 DIAGNOSIS — G894 Chronic pain syndrome: Secondary | ICD-10-CM | POA: Diagnosis not present

## 2020-05-08 DIAGNOSIS — M25512 Pain in left shoulder: Secondary | ICD-10-CM | POA: Diagnosis not present

## 2020-05-08 DIAGNOSIS — Z9889 Other specified postprocedural states: Secondary | ICD-10-CM | POA: Diagnosis not present

## 2020-05-08 NOTE — Progress Notes (Signed)
PROVIDER NOTE: Information contained herein reflects review and annotations entered in association with encounter. Interpretation of such information and data should be left to medically-trained personnel. Information provided to patient can be located elsewhere in the medical record under "Patient Instructions". Document created using STT-dictation technology, any transcriptional errors that may result from process are unintentional.    Patient: Melody Ochoa  Service Category: E/M  Provider: Gillis Santa, MD  DOB: 02-13-1941  DOS: 05/08/2020  Referring Provider: Burnard Hawthorne, FNP  MRN: [id number]  Setting: Ambulatory outpatient  PCP: Burnard Hawthorne, FNP  Type: Established Patient  Specialty: Interventional Pain Management    Location: Office  Delivery: Face-to-face     HPI  Reason for encounter: Ms. Melody Ochoa, a 79 y.o. year old female, is here today for evaluation and management of her Chronic left shoulder pain [M25.512, G89.29]. Ms. Melody Ochoa primary complain today is Shoulder Pain (left) and Hip Pain (left) Last encounter: Practice (02/07/2020). My last encounter with her was on 02/07/2020. Pertinent problems: Melody Ochoa has Spondylolisthesis of lumbar region; Failed back surgical syndrome; Low back pain; History of lumbar surgery; Lumbar radiculopathy; Foraminal stenosis of lumbar region; and Left shoulder pain on their pertinent problem list. Pain Assessment: Severity of Chronic pain is reported as a 3 /10. Location: Shoulder Left/denies. Onset: More than a month ago. Quality: (just hurts). Timing: Constant. Modifying factor(s): medications, positioning. Vitals:  height is 5' 4.5" (1.638 m) and weight is 207 lb (93.9 kg). Her temporal temperature is 98.7 F (37.1 C). Her blood pressure is 124/71 and her pulse is 87. Her respiration is 18 and oxygen saturation is 96%.   Patient presents today endorsing worsening left shoulder pain.  It is worse with abduction and and it is more  pronounced in the posterior region.  Patient has had previous left shoulder steroid injections which were helpful for her pain.  She is interested in repeating.  Risks and benefits reviewed and patient would like to proceed.  Patient will need to stop Plavix 7 days prior to scheduled procedure.  ROS  Constitutional: Denies any fever or chills Gastrointestinal: No reported hemesis, hematochezia, vomiting, or acute GI distress Musculoskeletal: Pain and decreased range of motion of left shoulder. Neurological: No reported episodes of acute onset apraxia, aphasia, dysarthria, agnosia, amnesia, paralysis, loss of coordination, or loss of consciousness  Medication Review  DULoxetine, albuterol, atorvastatin, buPROPion, clopidogrel, famotidine, fluticasone, and pregabalin  History Review  Allergy: Melody Ochoa is allergic to aleve [naproxen sodium]; aspirin; celebrex [celecoxib]; effexor [venlafaxine]; gabapentin; hydrocodone-homatropine; ibuprofen; tape; vioxx [rofecoxib]; other; and tramadol. Drug: Melody Ochoa  reports no history of drug use. Alcohol:  reports current alcohol use. Tobacco:  reports that she quit smoking about 23 years ago. Her smoking use included cigarettes. She has a 20.00 pack-year smoking history. She has never used smokeless tobacco. Social: Melody Ochoa  reports that she quit smoking about 23 years ago. Her smoking use included cigarettes. She has a 20.00 pack-year smoking history. She has never used smokeless tobacco. She reports current alcohol use. She reports that she does not use drugs. Medical:  has a past medical history of Arthritis, Atheromatous plaque (10/18/2017), Back pain, Cancer ([place]), Depression, Diverticulosis, Dizziness, GERD (gastroesophageal reflux disease), Heart murmur, Hyperlipidemia (10/18/2017), PONV (postoperative nausea and vomiting), and Stroke ([place]). Surgical: Melody Ochoa  has a past surgical history that includes Tonsillectomy; Facelift (94);  Cervical disc surgery (09); Back surgery (13); Colonoscopy with propofol (N/A, 01/13/2016); Total hip arthroplasty (Right, 04/22/2016); TEE  without cardioversion (N/A, 08/12/2017); LOOP RECORDER INSERTION (N/A, 08/23/2017); Breast excisional biopsy (Left, 90's); Breast biopsy (Bilateral); Cataract extraction w/PHACO (Left, 12/20/2018); Cataract extraction w/PHACO (Right, 01/24/2019); and Colonoscopy with propofol (N/A, 02/19/2020). Family: family history includes Aneurysm in her brother; Breast cancer (age of onset: 16) in her maternal aunt; Cushing syndrome in her paternal grandmother; Dementia in her paternal grandfather; Diabetes in her maternal grandmother; Kidney disease in her maternal grandfather; Lung cancer in her father; Stroke in her brother.  Laboratory Chemistry Profile   Renal Lab Results  Component Value Date   BUN 16 04/22/2020   CREATININE 1.18 04/22/2020   GFR 44.08 (L) 04/22/2020   GFRAA 59 (L) 03/11/2020   GFRNONAA 51 (L) 03/11/2020     Hepatic Lab Results  Component Value Date   AST 17 04/22/2020   ALT 13 04/22/2020   ALBUMIN 4.0 04/22/2020   ALKPHOS 62 04/22/2020     Electrolytes Lab Results  Component Value Date   NA 138 04/22/2020   K 4.0 04/22/2020   CL 105 04/22/2020   CALCIUM 8.9 04/22/2020     Bone No results found for: VD25OH, VD125OH2TOT, ZO1096EA5, WU9811BJ4, 25OHVITD1, 25OHVITD2, 25OHVITD3, TESTOFREE, TESTOSTERONE   Inflammation (CRP: Acute Phase) (ESR: Chronic Phase) No results found for: CRP, ESRSEDRATE, LATICACIDVEN     Note: Above Lab results reviewed.  Recent Imaging Review  CUP PACEART REMOTE DEVICE CHECK Carelink summary report received. Battery status OK. Normal device function. No new symptom episodes, tachy episodes, brady, or pause episodes. No new AF episodes. Monthly summary reports and ROV/PRN [place] Note: Reviewed        Physical Exam  General appearance: Well nourished, well developed, and well hydrated. In no apparent acute  distress Mental status: Alert, oriented x 3 (person, place, & time)       Respiratory: No evidence of acute respiratory distress Eyes: PERLA Vitals: BP 124/71   Pulse 87   Temp 98.7 F (37.1 C) (Temporal)   Resp 18   Ht 5' 4.5" (1.638 m)   Wt 207 lb (93.9 kg)   SpO2 96%   BMI 34.98 kg/m  BMI: Estimated body mass index is 34.98 kg/m as calculated from the following:   Height as of this encounter: 5' 4.5" (1.638 m).   Weight as of this encounter: 207 lb (93.9 kg). Ideal: Ideal body weight: 55.8 kg (123 lb 2 oz) Adjusted ideal body weight: 71.1 kg (156 lb 10.8 oz)   Upper Extremity (UE) Exam    Side: Right upper extremity  Side: Left upper extremity  Skin & Extremity Inspection: Skin color, temperature, and hair growth are WNL. No peripheral edema or cyanosis. No masses, redness, swelling, asymmetry, or associated skin lesions. No contractures.  Skin & Extremity Inspection: Skin color, temperature, and hair growth are WNL. No peripheral edema or cyanosis. No masses, redness, swelling, asymmetry, or associated skin lesions. No contractures.  Functional ROM: Unrestricted ROM          Functional ROM: Restricted ROM for shoulder  Muscle Tone/Strength: Functionally intact. No obvious neuro-muscular anomalies detected.  Muscle Tone/Strength: Functionally intact. No obvious neuro-muscular anomalies detected.  Sensory (Neurological): Unimpaired          Sensory (Neurological): Arthropathic arthralgia          Palpation: No palpable anomalies              Palpation: No palpable anomalies              Provocative Test(s):  Phalen's test: deferred  Tinel's test: deferred Apley's scratch test (touch opposite shoulder):  Action 1 (Across chest): deferred Action 2 (Overhead): deferred Action 3 (LB reach): deferred   Provocative Test(s):  Phalen's test: deferred Tinel's test: deferred Apley's scratch test (touch opposite shoulder):  Action 1 (Across chest): Decreased ROM Action 2 (Overhead):  Decreased ROM Action 3 (LB reach): Decreased ROM     Assessment   Status Diagnosis  Having a Flare-up Having a Flare-up Controlled 1. Chronic left shoulder pain   2. Primary osteoarthritis of left shoulder   3. History of lumbar surgery   4. Lumbar radiculopathy   5. Primary osteoarthritis of left hip   6. Failed back surgical syndrome   7. Chronic pain syndrome      Updated Problems: Problem  Left Shoulder Pain  Foraminal Stenosis of Lumbar Region  History of Lumbar Surgery  Failed Back Surgical Syndrome  Low Back Pain  Spondylolisthesis of Lumbar Region  Lumbar Radiculopathy  Status Post Total Replacement of Right Hip  Chronic Left Shoulder Pain    Plan of Care   Melody Ochoa has a current medication list which includes the following long-term medication(s): albuterol, atorvastatin, bupropion, clopidogrel, duloxetine, famotidine, and pregabalin.  Orders:  Orders Placed This Encounter  Procedures  . SHOULDER INJECTION    Standing Status:   Future    Standing Expiration Date:   06/07/2020    Scheduling Instructions:     Side: LEft     Sedation: Without Sedation.     Timeframe: As soon as schedule allows     Stop Plavix 7 days prior    Order Specific Question:   Where will this procedure be performed?    Answer:   [facility]    Comments:   [place]   Follow-up plan:   Return in about 2 weeks (around 05/22/2020) for left shoulder injection, without sedation.     Status post left shoulder intra-articular injection, posterior approach on 08/08/2019, 09/17/2019- helped, repeat PRN.        Recent Visits No visits were found meeting these conditions.  Showing recent visits within past 90 days and meeting all other requirements   Today's Visits Date Type Provider Dept  05/08/20 Office Visit Gillis Santa, MD [facility]  Showing today's visits and meeting all other requirements   Future Appointments No visits were found meeting these  conditions.  Showing future appointments within next 90 days and meeting all other requirements   I discussed the assessment and treatment plan with the patient. The patient was provided an opportunity to ask questions and all were answered. The patient agreed with the plan and demonstrated an understanding of the instructions.  Patient advised to call back or seek an in-person evaluation if the symptoms or condition worsens.  Duration of encounter: 21 minutes.  Note by: Gillis Santa, MD Date: 05/08/2020; Time: 3:35 PM

## 2020-05-08 NOTE — Patient Instructions (Addendum)
Stop PLAVIX for 7 days prior to procedure. Trigger Point Injection Trigger points are areas where you have pain. A trigger point injection is a shot given in the trigger point to help relieve pain for a few days to a few months. Common places for trigger points include:  The neck.  The shoulders.  The upper back.  The lower back. A trigger point injection will not cure long-term (chronic) pain permanently. These injections do not always work for every person. For some people, they can help to relieve pain for a few days to a few months. Tell a health care provider about:  Any allergies you have.  All medicines you are taking, including vitamins, herbs, eye drops, creams, and over-the-counter medicines.  Any problems you or family members have had with anesthetic medicines.  Any blood disorders you have.  Any surgeries you have had.  Any medical conditions you have. What are the risks? Generally, this is a safe procedure. However, problems may occur, including:  Infection.  Bleeding or bruising.  Allergic reaction to the injected medicine.  Irritation of the skin around the injection site. What happens before the procedure? Ask your health care provider about:  Changing or stopping your regular medicines. This is especially important if you are taking diabetes medicines or blood thinners.  Taking medicines such as aspirin and ibuprofen. These medicines can thin your blood. Do not take these medicines unless your health care provider tells you to take them.  Taking over-the-counter medicines, vitamins, herbs, and supplements. What happens during the procedure?   Your health care provider will feel for trigger points. A marker may be used to circle the area for the injection.  The skin over the trigger point will be washed with a germ-killing (antiseptic) solution.  A thin needle is used for the injection. You may feel pain or a twitching feeling when the needle enters  the trigger point.  A numbing solution may be injected into the trigger point. Sometimes a medicine to keep down inflammation is also injected.  Your health care provider may move the needle around the area where the trigger point is located until the tightness and twitching goes away.  After the injection, your health care provider may put gentle pressure over the injection site.  The injection site will be covered with a bandage (dressing). The procedure may vary among health care providers and hospitals. What can I expect after treatment? After treatment, you may have:  Soreness and stiffness for 1-2 days.  A dressing. This can be taken off in a few hours or as told by your health care provider. Follow these instructions at home: Injection site care  Remove your dressing as told by your health care provider.  Check your injection site every day for signs of infection. Check for: ? Redness, swelling, or pain. ? Fluid or blood. ? Warmth. ? Pus or a bad smell. Managing pain, stiffness, and swelling  If directed, put ice on the affected area. ? Put ice in a plastic bag. ? Place a towel between your skin and the bag. ? Leave the ice on for 20 minutes, 2-3 times a day. General instructions  If you were asked to stop your regular medicines, ask your health care provider when you may start taking them again.  Return to your normal activities as told by your health care provider. Ask your health care provider what activities are safe for you.  Do not take baths, swim, or use a hot  tub until your health care provider approves.  You may be asked to see an occupational or physical therapist for exercises that reduce muscle strain and stretch the area of the trigger point.  Keep all follow-up visits as told by your health care provider. This is important. Contact a health care provider if:  Your pain comes back, and it is worse than before the injection. You may need more  injections.  You have chills or a fever.  The injection site becomes more painful, red, swollen, or warm to the touch. Summary  A trigger point injection is a shot given in the trigger point to help relieve pain for a few days to a few months.  Common places for trigger point injections are the neck, shoulder, upper back, and lower back.  These injections do not always work for every person, but for some people, the injections can help to relieve pain for a few days to a few months.  Contact a health care provider if symptoms come back or they are worse than before treatment. Also, get help if the injection site becomes more painful, red, swollen, or warm to the touch. This information is not intended to replace advice given to you by your health care provider. Make sure you discuss any questions you have with your health care provider. Document Revised: 01/03/2019 Document Reviewed: 01/03/2019 Elsevier Patient Education  [place]  What are the risk, side effects and possible complications? Generally speaking, most procedures are safe.  However, with any procedure there are risks, side effects, and the possibility of complications.  The risks and complications are dependent upon the sites that are lesioned, or the type of nerve block to be performed.  The closer the procedure is to the spine, the more serious the risks are.  Great care is taken when placing the radio frequency needles, block needles or lesioning probes, but sometimes complications can occur. 1. Infection: Any time there is an injection through the skin, there is a risk of infection.  This is why sterile conditions are used for these blocks.  There are four possible types of infection. 1. Localized skin infection. 2. Central Nervous System Infection-This can be in the form of Meningitis, which can be deadly. 3. Epidural Infections-This can be in the form of an epidural abscess, which  can cause pressure inside of the spine, causing compression of the spinal cord with subsequent paralysis. This would require an emergency surgery to decompress, and there are no guarantees that the patient would recover from the paralysis. 4. Discitis-This is an infection of the intervertebral discs.  It occurs in about 1% of discography procedures.  It is difficult to treat and it may lead to surgery.        2. Pain: the needles have to go through skin and soft tissues, will cause soreness.       3. Damage to internal structures:  The nerves to be lesioned may be near blood vessels or    other nerves which can be potentially damaged.       4. Bleeding: Bleeding is more common if the patient is taking blood thinners such as  aspirin, Coumadin, Ticiid, Plavix, etc., or if he/she have some genetic predisposition  such as hemophilia. Bleeding into the spinal canal can cause compression of the spinal  cord with subsequent paralysis.  This would require an emergency surgery to  decompress and there are no guarantees that the patient would recover  from the  paralysis.       5. Pneumothorax:  Puncturing of a lung is a possibility, every time a needle is introduced in  the area of the chest or upper back.  Pneumothorax refers to free air around the  collapsed lung(s), inside of the thoracic cavity (chest cavity).  Another two possible  complications related to a similar event would include: Hemothorax and Chylothorax.   These are variations of the Pneumothorax, where instead of air around the collapsed  lung(s), you may have blood or chyle, respectively.       6. Spinal headaches: They may occur with any procedures in the area of the spine.       7. Persistent CSF (Cerebro-Spinal Fluid) leakage: This is a rare problem, but may occur  with prolonged intrathecal or epidural catheters either due to the formation of a fistulous  track or a dural tear.       8. Nerve damage: By working so close to the spinal cord,  there is always a possibility of  nerve damage, which could be as serious as a permanent spinal cord injury with  paralysis.       9. Death:  Although rare, severe deadly allergic reactions known as "Anaphylactic  reaction" can occur to any of the medications used.      10. Worsening of the symptoms:  We can always make thing worse.  What are the chances of something like this happening? Chances of any of this occuring are extremely low.  By statistics, you have more of a chance of getting killed in a motor vehicle accident: while driving to the hospital than any of the above occurring .  Nevertheless, you should be aware that they are possibilities.  In general, it is similar to taking a shower.  Everybody knows that you can slip, hit your head and get killed.  Does that mean that you should not shower again?  Nevertheless always keep in mind that statistics do not mean anything if you happen to be on the wrong side of them.  Even if a procedure has a 1 (one) in a 1,000,000 (million) chance of going wrong, it you happen to be that one..Also, keep in mind that by statistics, you have more of a chance of having something go wrong when taking medications.  Who should not have this procedure? If you are on a blood thinning medication (e.g. Coumadin, Plavix, see list of "Blood Thinners"), or if you have an active infection going on, you should not have the procedure.  If you are taking any blood thinners, please inform your physician.  How should I prepare for this procedure?  Do not eat or drink anything at least six hours prior to the procedure.  Bring a driver with you .  It cannot be a taxi.  Come accompanied by an adult that can drive you back, and that is strong enough to help you if your legs get weak or numb from the local anesthetic.  Take all of your medicines the morning of the procedure with just enough water to swallow them.  If you have diabetes, make sure that you are scheduled to have  your procedure done first thing in the morning, whenever possible.  If you have diabetes, take only half of your insulin dose and notify our nurse that you have done so as soon as you arrive at the clinic.  If you are diabetic, but only take blood sugar pills (oral hypoglycemic),  then do not take them on the morning of your procedure.  You may take them after you have had the procedure.  Do not take aspirin or any aspirin-containing medications, at least eleven (11) days prior to the procedure.  They may prolong bleeding.  Wear loose fitting clothing that may be easy to take off and that you would not mind if it got stained with Betadine or blood.  Do not wear any jewelry or perfume  Remove any nail coloring.  It will interfere with some of our monitoring equipment.  NOTE: Remember that this is not meant to be interpreted as a complete list of all possible complications.  Unforeseen problems may occur.  BLOOD THINNERS The following drugs contain aspirin or other products, which can cause increased bleeding during surgery and should not be taken for 2 weeks prior to and 1 week after surgery.  If you should need take something for relief of minor pain, you may take acetaminophen which is found in Tylenol,m Datril, Anacin-3 and Panadol. It is not blood thinner. The products listed below are.  Do not take any of the products listed below in addition to any listed on your instruction sheet.  A.P.C or A.P.C with Codeine Codeine Phosphate Capsules #3 Ibuprofen Ridaura  ABC compound Congesprin Imuran rimadil  Advil Cope Indocin Robaxisal  Alka-Seltzer Effervescent Pain Reliever and Antacid Coricidin or Coricidin-D  Indomethacin Rufen  Alka-Seltzer plus Cold Medicine Cosprin Ketoprofen S-A-C Tablets  Anacin Analgesic Tablets or Capsules Coumadin Korlgesic Salflex  Anacin Extra Strength Analgesic tablets or capsules CP-2 Tablets Lanoril Salicylate  Anaprox Cuprimine Capsules Levenox Salocol   Anexsia-D Dalteparin Magan Salsalate  Anodynos Darvon compound Magnesium Salicylate Sine-off  Ansaid Dasin Capsules Magsal Sodium Salicylate  Anturane Depen Capsules Marnal Soma  APF Arthritis pain formula Dewitt's Pills Measurin Stanback  Argesic Dia-Gesic Meclofenamic Sulfinpyrazone  Arthritis Bayer Timed Release Aspirin Diclofenac Meclomen Sulindac  Arthritis pain formula Anacin Dicumarol Medipren Supac  Analgesic (Safety coated) Arthralgen Diffunasal Mefanamic Suprofen  Arthritis Strength Bufferin Dihydrocodeine Mepro Compound Suprol  Arthropan liquid Dopirydamole Methcarbomol with Aspirin Synalgos  ASA tablets/Enseals Disalcid Micrainin Tagament  Ascriptin Doan's Midol Talwin  Ascriptin A/D Dolene Mobidin Tanderil  Ascriptin Extra Strength Dolobid Moblgesic Ticlid  Ascriptin with Codeine Doloprin or Doloprin with Codeine Momentum Tolectin  Asperbuf Duoprin Mono-gesic Trendar  Aspergum Duradyne Motrin or Motrin IB Triminicin  Aspirin plain, buffered or enteric coated Durasal Myochrisine Trigesic  Aspirin Suppositories Easprin Nalfon Trillsate  Aspirin with Codeine Ecotrin Regular or Extra Strength Naprosyn Uracel  Atromid-S Efficin Naproxen Ursinus  Auranofin Capsules Elmiron Neocylate Vanquish  Axotal Emagrin Norgesic Verin  Azathioprine Empirin or Empirin with Codeine Normiflo Vitamin E  Azolid Emprazil Nuprin Voltaren  Bayer Aspirin plain, buffered or children's or timed BC Tablets or powders Encaprin Orgaran Warfarin Sodium  Buff-a-Comp Enoxaparin Orudis Zorpin  Buff-a-Comp with Codeine Equegesic Os-Cal-Gesic   Buffaprin Excedrin plain, buffered or Extra Strength Oxalid   Bufferin Arthritis Strength Feldene Oxphenbutazone   Bufferin plain or Extra Strength Feldene Capsules Oxycodone with Aspirin   Bufferin with Codeine Fenoprofen Fenoprofen Pabalate or Pabalate-SF   Buffets II Flogesic Panagesic   Buffinol plain or Extra Strength Florinal or Florinal with Codeine  Panwarfarin   Buf-Tabs Flurbiprofen Penicillamine   Butalbital Compound Four-way cold tablets Penicillin   Butazolidin Fragmin Pepto-Bismol   Carbenicillin Geminisyn Percodan   Carna Arthritis Reliever Geopen Persantine   Carprofen Gold's salt Persistin   Chloramphenicol Goody's Phenylbutazone   Chloromycetin Haltrain Piroxlcam   Clmetidine  heparin Plaquenil   Cllnoril Hyco-pap Ponstel   Clofibrate Hydroxy chloroquine Propoxyphen         Before stopping any of these medications, be sure to consult the physician who ordered them.  Some, such as Coumadin (Warfarin) are ordered to prevent or treat serious conditions such as "deep thrombosis", "pumonary embolisms", and other heart problems.  The amount of time that you may need off of the medication may also vary with the medication and the reason for which you were taking it.  If you are taking any of these medications, please make sure you notify your pain physician before you undergo any procedures.

## 2020-05-08 NOTE — Progress Notes (Signed)
Nursing Pain Medication Assessment:  Safety precautions to be maintained throughout the outpatient stay will include: orient to surroundings, keep bed in low position, maintain call bell within reach at all times, provide assistance with transfer out of bed and ambulation.  Medication Inspection Compliance: Pill count conducted under aseptic conditions, in front of the patient. Neither the pills nor the bottle was removed from the patient's sight at any time. Once count was completed pills were immediately returned to the patient in their original bottle.  Medication: Oxycodone IR Pill/Patch Count: 8 of 45 pills remain Pill/Patch Appearance: Markings consistent with prescribed medication Bottle Appearance: Standard pharmacy container. Clearly labeled. Filled Date: 03 / 01 / 2021 Last Medication intake:  2-3 days ago

## 2020-05-16 DIAGNOSIS — M25562 Pain in left knee: Secondary | ICD-10-CM | POA: Diagnosis not present

## 2020-05-16 DIAGNOSIS — M7542 Impingement syndrome of left shoulder: Secondary | ICD-10-CM | POA: Diagnosis not present

## 2020-05-18 ENCOUNTER — Other Ambulatory Visit: Payer: Self-pay | Admitting: Internal Medicine

## 2020-05-19 ENCOUNTER — Other Ambulatory Visit: Payer: Self-pay | Admitting: Internal Medicine

## 2020-05-19 NOTE — Telephone Encounter (Signed)
Please schedule overdue F/U appointment with Dr. Caryl Comes. Thank you!

## 2020-05-19 NOTE — Telephone Encounter (Signed)
Scheduled 7/6

## 2020-05-21 ENCOUNTER — Other Ambulatory Visit (INDEPENDENT_AMBULATORY_CARE_PROVIDER_SITE_OTHER): Payer: Medicare Other

## 2020-05-21 ENCOUNTER — Other Ambulatory Visit: Payer: Self-pay

## 2020-05-21 DIAGNOSIS — N189 Chronic kidney disease, unspecified: Secondary | ICD-10-CM

## 2020-05-21 LAB — BASIC METABOLIC PANEL
BUN: 24 mg/dL — ABNORMAL HIGH (ref 6–23)
CO2: 27 mEq/L (ref 19–32)
Calcium: 8.9 mg/dL (ref 8.4–10.5)
Chloride: 102 mEq/L (ref 96–112)
Creatinine, Ser: 1.19 mg/dL (ref 0.40–1.20)
GFR: 43.64 mL/min — ABNORMAL LOW (ref >60.00)
Glucose, Bld: 109 mg/dL — ABNORMAL HIGH (ref 70–99)
Potassium: 3.9 mEq/L (ref 3.5–5.1)
Sodium: 137 mEq/L (ref 135–145)

## 2020-05-22 LAB — MULTIPLE MYELOMA PANEL, SERUM

## 2020-05-22 LAB — PE AND FLC, SERUM

## 2020-05-22 LAB — SPECIMEN STATUS REPORT

## 2020-05-23 ENCOUNTER — Other Ambulatory Visit: Payer: Medicare Other

## 2020-05-26 ENCOUNTER — Ambulatory Visit (INDEPENDENT_AMBULATORY_CARE_PROVIDER_SITE_OTHER): Payer: Medicare Other | Admitting: *Deleted

## 2020-05-26 DIAGNOSIS — I63219 Cerebral infarction due to unspecified occlusion or stenosis of unspecified vertebral arteries: Secondary | ICD-10-CM | POA: Diagnosis not present

## 2020-05-26 LAB — CUP PACEART REMOTE DEVICE CHECK
Date Time Interrogation Session: 20210621005427
Implantable Pulse Generator Implant Date: 20180918

## 2020-05-27 ENCOUNTER — Ambulatory Visit: Payer: Medicare Other

## 2020-05-27 ENCOUNTER — Other Ambulatory Visit: Payer: Self-pay | Admitting: Internal Medicine

## 2020-05-27 NOTE — Progress Notes (Signed)
Carelink Summary Report / Loop Recorder 

## 2020-05-29 ENCOUNTER — Telehealth: Payer: Self-pay | Admitting: Student in an Organized Health Care Education/Training Program

## 2020-05-29 ENCOUNTER — Ambulatory Visit
Admission: RE | Admit: 2020-05-29 | Discharge: 2020-05-29 | Disposition: A | Discharge status: Home / Self Care | Payer: Medicare Other | Source: Ambulatory Visit | Attending: Adult Health | Admitting: Adult Health

## 2020-05-29 ENCOUNTER — Encounter: Payer: Self-pay | Admitting: Student in an Organized Health Care Education/Training Program

## 2020-05-29 ENCOUNTER — Other Ambulatory Visit: Payer: Self-pay

## 2020-05-29 ENCOUNTER — Ambulatory Visit (HOSPITAL_BASED_OUTPATIENT_CLINIC_OR_DEPARTMENT_OTHER): Payer: Medicare Other | Admitting: Student in an Organized Health Care Education/Training Program

## 2020-05-29 DIAGNOSIS — G8929 Other chronic pain: Secondary | ICD-10-CM

## 2020-05-29 DIAGNOSIS — J449 Chronic obstructive pulmonary disease, unspecified: Secondary | ICD-10-CM | POA: Diagnosis not present

## 2020-05-29 DIAGNOSIS — M5416 Radiculopathy, lumbar region: Secondary | ICD-10-CM

## 2020-05-29 DIAGNOSIS — Z9889 Other specified postprocedural states: Secondary | ICD-10-CM

## 2020-05-29 DIAGNOSIS — M25512 Pain in left shoulder: Secondary | ICD-10-CM

## 2020-05-29 DIAGNOSIS — J8410 Pulmonary fibrosis, unspecified: Secondary | ICD-10-CM | POA: Diagnosis not present

## 2020-05-29 DIAGNOSIS — G894 Chronic pain syndrome: Secondary | ICD-10-CM | POA: Diagnosis not present

## 2020-05-29 DIAGNOSIS — J849 Interstitial pulmonary disease, unspecified: Secondary | ICD-10-CM | POA: Diagnosis not present

## 2020-05-29 MED ORDER — OXYCODONE-ACETAMINOPHEN 5-325 MG PO TABS: 1.0000 | ORAL_TABLET | Freq: Two times a day (BID) | ORAL | 0 refills | Status: AC | PRN

## 2020-05-29 NOTE — Progress Notes (Signed)
Patient: Melody Ochoa  Service Category: E/M  Provider: Gillis Santa, MD  DOB: [birth year]  DOS: 05/29/2020  Location: Office  MRN: [id number]  Setting: Ambulatory outpatient  Referring Provider: Burnard Hawthorne, FNP  Type: Established Patient  Specialty: Interventional Pain Management  PCP: Burnard Hawthorne, FNP  Location: Home  Delivery: TeleHealth     Virtual Encounter - Pain Management PROVIDER NOTE: Information contained herein reflects review and annotations entered in association with encounter. Interpretation of such information and data should be left to medically-trained personnel. Information provided to patient can be located elsewhere in the medical record under "Patient Instructions". Document created using STT-dictation technology, any transcriptional errors that may result from process are unintentional.    Contact & Pharmacy Preferred: [phone] Home: [phone] (home) Mobile: [phone] (mobile) E-mail: [email or web address]  [id number] [place] [address]Phone: [phone]: [phone]  Pre-screening  Melody Ochoa offered "in-person" vs "virtual" encounter. She indicated preferring virtual for this encounter.   Reason COVID-19*  Social distancing based on CDC and AMA recommendations.   I contacted Melody QUIZHPIon 05/29/2020 via televisit.      I clearly identified myself as BGillis Santa MD. I verified that I was speaking with the correct person using two identifiers (Name: Melody Ochoa and date of birth: [id number].  Consent I sought verbal advanced consent from DMichiel Cowboyfor virtual visit interactions. I informed Ms. MProfetaof possible security and privacy concerns, risks, and limitations associated with providing "not-in-person" medical evaluation and management services. I also informed Melody Ochoa of the availability of "in-person" appointments. Finally, I  informed her that there would be a charge for the virtual visit and that she could be  personally, fully or partially, financially responsible for it. Melody Ochoa understanding and agreed to proceed.   Historic Elements   Ms. DANALYSA NUTTINGis a 79y.o. year old, female patient evaluated today after her last contact with our practice on 05/29/2020. Melody Ochoa has a past medical history of Arthritis, Atheromatous plaque (10/18/2017), Back pain, Cancer ([place], Depression, Diverticulosis, Dizziness, GERD (gastroesophageal reflux disease), Heart murmur, Hyperlipidemia (10/18/2017), PONV (postoperative nausea and vomiting), and St[place] ([place]. She also  has a past surgical history that includes Tonsillectomy; Facelift (89); Cervical disc surgery (09); Back surgery (12); Colonoscopy with propofol (N/A, 01/13/2016); Total hip arthroplasty (Right, 04/22/2016); TEE without cardioversion (N/A, 08/12/2017); LOOP RECORDER INSERTION (N/A, 08/23/2017); Breast excisional biopsy (Left, 90's); Breast biopsy (Bilateral); Cataract extraction w/PHACO (Left, 12/20/2018); Cataract extraction w/PHACO (Right, 01/24/2019); and Colonoscopy with propofol (N/A, 02/19/2020). Melody Ochoa a current medication list which includes the following prescription(s): albuterol, atorvastatin, bupropion, clopidogrel, duloxetine, famotidine, fluticasone, oxycodone-acetaminophen, and pregabalin. She  reports that she quit smoking about 23 years ago. Her smoking use included cigarettes. She has a 20.00 pack-year smoking history. She has never used smokeless tobacco. She reports current alcohol use. She reports that she does not use drugs. Ms. MSpampinatois allergic to aleve [naproxen sodium], aspirin, celebrex [celecoxib], effexor [venlafaxine], gabapentin, hydrocodone-homatropine, ibuprofen, tape, vioxx [rofecoxib], other, and tramadol.   HPI  Today, she is being contacted for medication management.   No change in medical history since last  visit.  Patient's pain is at baseline.  Patient continues multimodal pain regimen as prescribed.  States that it provides pain relief and improvement in functional status.  Pharmacotherapy Assessment  Analgesic: 02/04/2020  2   11/08/2019  Oxycodone-Acetaminophen 5-325  45.00  [place]   [id number]   [place] (2541)   0  11.25 MME  Medicare   [place]   Monitoring: [place] PMP: PDMP reviewed during this encounter.       Pharmacotherapy: No side-effects or adverse reactions reported. Compliance: No problems identified. Effectiveness: Clinically acceptable. Plan: Refer to "POC".  UDS:  Summary  Date Value Ref Range Status  01/23/2020 Note  Final    Comment:    ==================================================================== ToxASSURE Select 13 (MW) ==================================================================== Test                             Result       Flag       Units   NO DRUGS DETECTED. ==================================================================== Test                      Result    Flag   Units      Ref Range   Creatinine              219              mg/dL      >=20 ==================================================================== Declared Medications:  The flagging and interpretation on this report are based on the  following declared medications.  Unexpected results may arise from  inaccuracies in the declared medications.  **Note: The testing scope of this panel does not include the  following reported medications:  Albuterol  Amlodipine  Atorvastatin  Bupropion  Clopidogrel  Duloxetine  Famotidine  Fluticasone  Pregabalin  Tiotropium  Triamcinolone ==================================================================== For clinical consultation, please call [phone]. ====================================================================     Laboratory Chemistry Profile   Renal Lab Results  Component Value Date   BUN 24 (H) 05/21/2020   CREATININE  1.19 05/21/2020   GFR 43.64 (L) 05/21/2020   GFRAA 59 (L) 03/11/2020   GFRNONAA 51 (L) 03/11/2020     Hepatic Lab Results  Component Value Date   AST 17 04/22/2020   ALT 13 04/22/2020   ALBUMIN 4.0 04/22/2020   ALKPHOS 62 04/22/2020     Electrolytes Lab Results  Component Value Date   NA 137 05/21/2020   K 3.9 05/21/2020   CL 102 05/21/2020   CALCIUM 8.9 05/21/2020     Bone No results found for: VD25OH, VD125OH2TOT, QP5916BW4, YK5993TT0, 25OHVITD1, 25OHVITD2, 25OHVITD3, TESTOFREE, TESTOSTERONE   Inflammation (CRP: Acute Phase) (ESR: Chronic Phase) No results found for: CRP, ESRSEDRATE, LATICACIDVEN     Note: Above Lab results reviewed.   Imaging  CUP PACEART REMOTE DEVICE CHECK Carelink summary report received. Battery status OK. Normal device function. No new symptom episodes, tachy episodes, brady, or pause episodes. No new AF episodes. Monthly summary reports and ROV/PRN  Assessment  The primary encounter diagnosis was Chronic left shoulder pain. Diagnoses of Lumbar radiculopathy, History of lumbar surgery, and Chronic pain syndrome were also pertinent to this visit.  Plan of Care  Melody Ochoa ERLANDSON has a current medication list which includes the following long-term medication(s): albuterol, atorvastatin, bupropion, clopidogrel, duloxetine, famotidine, and pregabalin.  Pharmacotherapy (Medications Ordered): Meds ordered this encounter  Medications  . oxyCODONE-acetaminophen (PERCOCET) 5-325 MG tablet    Sig: Take 1 tablet by mouth 2 (two) times daily as needed for severe pain. Must last 30 days.    Dispense:  45 tablet    Refill:  0    Chronic Pain. (STOP Act - Not applicable). Fill one day early if closed  on scheduled refill date.   Follow-up plan:   Return if symptoms worsen or fail to improve.     Status post left shoulder intra-articular injection, posterior approach on 08/08/2019, 09/17/2019- helped, repeat PRN.         Recent Visits Date Type Provider  Dept  05/08/20 Office Visit Gillis Santa, MD [facility]  Showing recent visits within past 90 days and meeting all other requirements Today's Visits Date Type Provider Dept  05/29/20 Telemedicine Gillis Santa, MD [facility]  Showing today's visits and meeting all other requirements Future Appointments No visits were found meeting these conditions. Showing future appointments within next 90 days and meeting all other requirements  I discussed the assessment and treatment plan with the patient. The patient was provided an opportunity to ask questions and all were answered. The patient agreed with the plan and demonstrated an understanding of the instructions.  Patient advised to call back or seek an in-person evaluation if the symptoms or condition worsens.  Duration of encounter: 30 minutes.  Note by: Gillis Santa, MD Date: 05/29/2020; Time: 2:36 PM

## 2020-05-29 NOTE — Telephone Encounter (Addendum)
Patient called asking for medication refills on Oxycodone. She is going out of town on Monday and only has 3 left She had med mgmt appt on 05-08-20 Please check with DR. Lateef and let patient know status.  She also has no future appointments scheduled. She stated she saw Dr. Sabra Heck and he gave her a shot in her shoulder.   [phone]

## 2020-05-29 NOTE — Telephone Encounter (Signed)
Please advise on this. Thank you.

## 2020-06-04 DIAGNOSIS — H2513 Age-related nuclear cataract, bilateral: Secondary | ICD-10-CM | POA: Diagnosis not present

## 2020-06-10 ENCOUNTER — Other Ambulatory Visit: Payer: Self-pay

## 2020-06-10 ENCOUNTER — Telehealth: Payer: Self-pay | Admitting: Internal Medicine

## 2020-06-10 ENCOUNTER — Encounter: Payer: Self-pay | Admitting: Internal Medicine

## 2020-06-10 ENCOUNTER — Ambulatory Visit (INDEPENDENT_AMBULATORY_CARE_PROVIDER_SITE_OTHER): Payer: Medicare Other | Admitting: Internal Medicine

## 2020-06-10 VITALS — BP 110/70 | HR 81 | Ht 64.5 in | Wt 180.0 lb

## 2020-06-10 DIAGNOSIS — I639 Cerebral infarction, unspecified: Secondary | ICD-10-CM | POA: Diagnosis not present

## 2020-06-10 DIAGNOSIS — Z959 Presence of cardiac and vascular implant and graft, unspecified: Secondary | ICD-10-CM | POA: Diagnosis not present

## 2020-06-10 MED ORDER — FUROSEMIDE 20 MG PO TABS: ORAL_TABLET | ORAL | 0 refills | Status: DC

## 2020-06-10 NOTE — Progress Notes (Signed)
ELECTROPHYSIOLOGY OFFICE NOTE  Patient ID: Melody Ochoa, MRN: [id number], DOB/AGE: 11-Jun-1941 79 y.o. Admit date: (Not on file) Date of Consult: 06/10/2020  Primary Physician: Burnard Hawthorne, FNP Primary Cardiologist: new      HPI Melody Ochoa is a 79 y.o. female  Seen following ILR LINQ implantation 9/18 for Cryptogenic Stroke by [facility]    No intercurrent neurological symptoms  Dizziness upon standing with tendency to fall backwards  No chest pain but severe and worsening DOE diagnosed with "COPD:"  hrCT Spectrum of findings compatible with fibrotic interstitial lung disease with mild honeycombing and without a clear apicobasilar gradient. No convincing interval progression since 05/21/2019 chest CT, although with mild progression since baseline 2018 chest CT.  Been increasingly non ambulatory since COVID  2 years ago was walking 2 miles; weight largely unchanged     7/20 Echocardiogram demonstrated EF 50-55%, mild LVH, grade 1 diastolic dysfunction, LA 44.  TEE  Bulky aortic calcified plaque/ MR   Date LDL  9/18 126  10 /20 67   Date Cr K Hgb  6/21 1.19 3.9 11.1               Past Medical History:  Diagnosis Date  . Arthritis   . Atheromatous plaque 10/18/2017  . Back pain   . Cancer ([place])    skin  . Depression   . Diverticulosis   . Dizziness    patient had episode of dizziness when came in room. hx past no dx  . GERD (gastroesophageal reflux disease)   . Heart murmur   . Hyperlipidemia 10/18/2017  . PONV (postoperative nausea and vomiting)    yrs ago  . Stroke [facility])       Surgical History:  Past Surgical History:  Procedure Laterality Date  . BACK SURGERY  13  . BREAST BIOPSY Bilateral    cores "years ago"  . BREAST EXCISIONAL BIOPSY Left 90's  . CATARACT EXTRACTION W/PHACO Left 12/20/2018   Procedure: CATARACT EXTRACTION PHACO AND INTRAOCULAR LENS PLACEMENT ([place]) LEFT TOPICAL;  Surgeon: Leandrew Koyanagi, MD;  Location: [place];  Service: Ophthalmology;  Laterality: Left;  . CATARACT EXTRACTION W/PHACO Right 01/24/2019   Procedure: CATARACT EXTRACTION PHACO AND INTRAOCULAR LENS PLACEMENT ([place]) RIGHT;  Surgeon: Leandrew Koyanagi, MD;  Location: [place];  Service: Ophthalmology;  Laterality: Right;  . CERVICAL DISC SURGERY  09  . COLONOSCOPY WITH PROPOFOL N/A 01/13/2016   Procedure: COLONOSCOPY WITH PROPOFOL;  Surgeon: [name], MD;  Location: [facility] ENDOSCOPY;  Service: Endoscopy;  Laterality: N/A;  . COLONOSCOPY WITH PROPOFOL N/A 02/19/2020   Procedure: COLONOSCOPY WITH PROPOFOL;  Surgeon: [name], MD;  Location[facility] ENDOSCOPY;  Service: Gastroenterology;  Laterality: N/A;  . FACELIFT  94  . LOOP RECORDER INSERTION N/A 08/23/2017   Procedure: LOOP RECORDER INSERTION;  Surgeon: Thompson Grayer, MD;  Location: [place] CV LAB;  Service: Cardiovascular;  Laterality: N/A;  . TEE WITHOUT CARDIOVERSION N/A 08/12/2017   Procedure: TRANSESOPHAGEAL ECHOCARDIOGRAM (TEE);  Surgeon: Sueanne Margarita, MD;  Location: [facility] ENDOSCOPY;  Service: Cardiovascular;  Laterality: N/A;  . TONSILLECTOMY    . TOTAL HIP ARTHROPLASTY Right 04/22/2016   Procedure: TOTAL HIP ARTHROPLASTY;  Surgeon: Corky Mull, MD;  Location: [facility] ORS;  Service: Orthopedics;  Laterality: Right;     Home Meds: Prior to Admission medications   Medication Sig Start Date End Date Taking? Authorizing Provider  atorvastatin (LIPITOR) 40 MG tablet Take 1 tablet (40 mg  total) by mouth daily at 6 PM. 08/12/17  Yes Patteson, Arlan Organ, NP  buPROPion (WELLBUTRIN XL) 300 MG 24 hr tablet Take 300 mg by mouth daily.  07/15/17  Yes [provider]  Calcium Carbonate-Vitamin D (CALTRATE 600+D PO) Take 1 tablet by mouth daily.   Yes [provider]  clopidogrel (PLAVIX) 75 MG tablet Take 1 tablet (75 mg total) by mouth daily. 08/13/17  Yes Patteson, Arlan Organ, NP  Cyanocobalamin (VITAMIN B-12 PO) Take 1 tablet by mouth daily.   Yes [provider]  DULoxetine (CYMBALTA) 60 MG capsule Take 60 mg by mouth daily.    Yes [provider]  ferrous sulfate 325 (65 FE) MG tablet Take 1 tablet (325 mg total) by mouth daily. 08/12/17  Yes Patteson, Arlan Organ, NP  gabapentin (NEURONTIN) 300 MG capsule Take 300 mg by mouth at bedtime.  09/01/14  Yes [provider]  vitamin C (ASCORBIC ACID) 500 MG tablet Take 500 mg by mouth daily.   Yes [provider]    Allergies:  Allergies  Allergen Reactions  . Aleve [Naproxen Sodium] Other (See Comments)    Gi upset  . Aspirin Other (See Comments)    Stomach pain-aggravates diverticulosis  . Celebrex [Celecoxib] Other (See Comments)    Dizziness   . Effexor [Venlafaxine] Other (See Comments)    Hot flashes   . Gabapentin Other (See Comments)    "Night terrors"  . Hydrocodone-Homatropine Diarrhea  . Ibuprofen Other (See Comments)    Gi upset   . Tape Itching and Other (See Comments)    Itchy blisters  . Vioxx [Rofecoxib] Other (See Comments)    Gi upset  . Other Rash and Other (See Comments)    bandaides  . Tramadol Other (See Comments)    hallucinations    Social History   Socioeconomic History  . Marital status: Married    Spouse name: Not on file  . Number of children: Not on file  . Years of education: Not on file  . Highest education level: Not on file  Occupational History  . Not on file  Tobacco Use  . Smoking status: Former Smoker    Packs/day: 0.50    Years: 40.00    Pack years: 20.00    Types: Cigarettes    Quit date: 11/13/1996    Years since quitting: 23.5  . Smokeless tobacco: Never Used  Vaping Use  . Vaping Use: Never used  Substance and Sexual Activity  . Alcohol use: Yes    Comment: occ wine  . Drug use: No  . Sexual activity: Not Currently  Other Topics Concern  . Not on file  Social History Narrative  . Not on file   Social Determinants of Health   Financial Resource Strain:   . Difficulty of Paying Living  Expenses:   Food Insecurity: No Food Insecurity  . Worried About Charity fundraiser in the Last Year: Never true  . Ran Out of Food in the Last Year: Never true  Transportation Needs:   . Lack of Transportation (Medical):   Marland Kitchen Lack of Transportation (Non-Medical):   Physical Activity: Unknown  . Days of Exercise per Week: 0 days  . Minutes of Exercise per Session: Not on file  Stress:   . Feeling of Stress :   Social Connections:   . Frequency of Communication with Friends and Family:   . Frequency of Social Gatherings with Friends and Family:   . Attends  Religious Services:   . Active Member of Clubs or Organizations:   . Attends Archivist Meetings:   Marland Kitchen Marital Status:   Intimate Partner Violence:   . Fear of Current or Ex-Partner:   . Emotionally Abused:   Marland Kitchen Physically Abused:   . Sexually Abused:      Family History  Problem Relation Age of Onset  . Lung cancer Father   . Aneurysm Brother   . Stroke Brother   . Diabetes Maternal Grandmother   . Kidney disease Maternal Grandfather   . Cushing syndrome Paternal Grandmother   . Dementia Paternal Grandfather   . Breast cancer Maternal Aunt 52     ROS:  Please see the history of present illness.     All other systems reviewed and negative.   Physical Examination: BP 110/70 (BP Location: Right Arm, Patient Position: Sitting, Cuff Size: Normal)   Pulse 81   Ht 5' 4.5" (1.638 m)   Wt 180 lb (81.6 kg)   SpO2 98%   BMI 30.42 kg/m   Well developed and well nourished in no acute distress HENT normal Neck supple with JVP-8 Clear Device pocket well healed; without hematoma or erythema.  There is no tethering  Regular rate and rhythm, no  gallop No  murmur Abd-soft with active BS No Clubbing cyanosis 1+ edema Skin-warm and dry A & Oriented  Grossly normal sensory and motor function  ECG sinus   LINQ interrogation 05/26/20>> no interval afib   Assessment and Plan:  Cryptogenic Stroke   Aortic  plaque  LINQ in place  Hyperlipidemia  Orthostatic LH    DOE   Volume overload    LDL now at goal, continue current status  No atrial fib\  Orthostatic LH We discussed the physiology of orthostatic intolerance including gravitational fluid shifts and the impact of hypertensive vascular disease on orthostasis and treatment options.  We discussed pharmacological options including midodrine .  We discussed nonpharmacological options including raising the HOB, isometric contraction upon standing, abdominal binders  thigh sleeves. We emphasized the importance of recognizing the prodrome and sitting prior to falling, safety in the shower and in the bathroom and the avoidance of dehydration   We will begin with a nonpharmacological approach with abdominal binder  She is weak but averse to physical therapy-- recommended CUBII    .  Virl Axe

## 2020-06-10 NOTE — Patient Instructions (Addendum)
Medication Instructions:  - Your physician recommends that you continue on your current medications as directed. Please refer to the Current Medication list given to you today.  *If you need a refill on your cardiac medications before your next appointment, please call your pharmacy*   Lab Work: - none ordered  If you have labs (blood work) drawn today and your tests are completely normal, you will receive your results only by: Marland Kitchen MyChart Message (if you have MyChart) OR . A paper copy in the mail If you have any lab test that is abnormal or we need to change your treatment, we will call you to review the results.   Testing/Procedures: - none ordered   Follow-Up: At [facility], you and your health needs are our priority.  As part of our continuing mission to provide you with exceptional heart care, we have created designated Provider Care Teams.  These Care Teams include your primary Cardiologist (physician) and Advanced Practice Providers (APPs -  Physician Assistants and Nurse Practitioners) who all work together to provide you with the care you need, when you need it.  We recommend signing up for the patient portal called "MyChart".  Sign up information is provided on this After Visit Summary.  MyChart is used to connect with patients for Virtual Visits (Telemedicine).  Patients are able to view lab/test results, encounter notes, upcoming appointments, etc.  Non-urgent messages can be sent to your provider as well.   To learn more about what you can do with MyChart, go to [email or web address].    Your next appointment:   1 year(s)  The format for your next appointment:   In Person  Provider:   Virl Axe, MD   Other Instructions You may try:  1) An abdominal binder to be worn during your waking hours to help with sudden drops in your blood pressure  2) Cubii

## 2020-06-10 NOTE — Telephone Encounter (Signed)
I reviewed the patient's message with Dr. Caryl Comes. Per MD, he advised the patient could have a short trial of lasix:  - take lasix 20 mg once daily x 3 days, may then take 1 tablet daily as needed for swelling- dispense # 10 tablets.  I have spoken with the patient regarding the above MD recommendations. She voices understanding and is agreeable.

## 2020-06-10 NOTE — Telephone Encounter (Signed)
Patient calling in post visit with Dr. Caryl Comes stating that in the visit there was a discussion about medication to get fluid off patient. Patient states she did not get a Rx for anything and wanted to call in for clarification  Please advise

## 2020-06-10 NOTE — Telephone Encounter (Signed)
The patient was seen in clinic.  No mention by Dr. Caryl Comes to me of starting the patient on a diuretic.  Will forward to Dr. Caryl Comes to review.

## 2020-06-11 ENCOUNTER — Ambulatory Visit: Payer: Medicare Other | Admitting: Pulmonary Disease

## 2020-06-13 LAB — IFE AND PE, RANDOM URINE
% BETA, Urine: 26.1 %
ALBUMIN, U: 46.5 %
ALPHA 1 URINE: 3.5 %
ALPHA-2-GLOBULIN, U: 8.7 %
GAMMA GLOBULIN URINE: 15.2 %
Protein, Ur: 24.9 mg/dL

## 2020-06-13 LAB — FREE K+L LT CHAINS,QN,UR
Free Kappa Lt Chains,Ur: 52.09 mg/L (ref 0.63–113.79)
Free Lambda Lt Chains,Ur: 3.81 mg/L (ref 0.47–11.77)
Kappa/Lambda Ratio,U: 13.67 (ref 1.03–31.76)

## 2020-06-13 LAB — SPECIMEN STATUS REPORT

## 2020-06-14 ENCOUNTER — Other Ambulatory Visit: Payer: Self-pay | Admitting: Internal Medicine

## 2020-06-25 ENCOUNTER — Other Ambulatory Visit: Payer: Self-pay

## 2020-06-25 ENCOUNTER — Encounter: Payer: Self-pay | Admitting: Pulmonary Disease

## 2020-06-25 ENCOUNTER — Ambulatory Visit: Payer: Medicare Other | Admitting: Pulmonary Disease

## 2020-06-25 VITALS — BP 118/72 | HR 82 | Temp 98.1°F | Ht 64.5 in | Wt 205.0 lb

## 2020-06-25 DIAGNOSIS — R0602 Shortness of breath: Secondary | ICD-10-CM

## 2020-06-25 DIAGNOSIS — J8410 Pulmonary fibrosis, unspecified: Secondary | ICD-10-CM

## 2020-06-25 NOTE — Patient Instructions (Signed)
We are going to get breathing test.  We are also going to get a heart test to evaluate your heart chambers and heart murmur.  We are going to check oxygen level at nighttime.   We will see you back in 2-3 months time call sooner should any new problems arise.

## 2020-06-25 NOTE — Progress Notes (Signed)
    Assessment & Plan:  1. SOB (shortness of breath) (Primary) - Pulmonary Function Test [facility] Only; Future - ECHOCARDIOGRAM COMPLETE; Future  2. Pulmonary fibrosis (HCC)   Patient Instructions  We are going to get breathing test.  We are also going to get a heart test to evaluate your heart chambers and heart murmur.  We are going to check oxygen  level at nighttime.   We will see you back in 2-3 months time call sooner should any new problems arise.  Please note: late entry documentation due to logistical difficulties during COVID-19 pandemic. This note is filed for information purposes only, and is not intended to be used for billing, nor does it represent the full scope/nature of the visit in question. Please see any associated scanned media linked to date of encounter for additional pertinent information.  Subjective:    HPI: Melody Ochoa is a 79 y.o. female presenting to the pulmonology clinic on 06/25/2020 with report of: Follow-up (c/o sob with exertion and wheezing. )   SPIROMETRY: FVC was 2.71 liters, 108% of predicted FEV1 was 2.27, 118% of predicted FEV1 ratio was 83 FEF 25-75% liters per second was 151% of predicted  LUNG VOLUMES: TLC was 84% of predicted RV was 52% of predicted  DIFFUSION CAPACITY: DLCO was 78% of predicted DLCO/VA was 109% of predicted  FLOW VOLUME LOOP: Normal  Impression Spirometry And lung volumes are in normal range Dlco is mildly decreased but corrects for VA  Outpatient Encounter Medications as of 06/25/2020  Medication Sig   [EXPIRED] oxyCODONE -acetaminophen  (PERCOCET) 5-325 MG tablet Take 1 tablet by mouth 2 (two) times daily as needed for severe pain. Must last 30 days.   [DISCONTINUED] albuterol  (VENTOLIN  HFA) 108 (90 Base) MCG/ACT inhaler TAKE 2 PUFFS BY MOUTH EVERY 6 HOURS AS NEEDED FOR WHEEZE OR SHORTNESS OF BREATH (Patient not taking: Reported on 08/26/2020)   [DISCONTINUED] atorvastatin  (LIPITOR ) 80 MG tablet TAKE 1  TABLET BY MOUTH DAILY AT 6 PM. NEEDS OFFICE VISIT FOR FURTHER REFILLS. 2ND ATTEMPT (Patient taking differently: Take 80 mg by mouth daily.)   [DISCONTINUED] buPROPion  (WELLBUTRIN  XL) 300 MG 24 hr tablet Take 300 mg by mouth daily.    [DISCONTINUED] clopidogrel  (PLAVIX ) 75 MG tablet TAKE 1 TABLET BY MOUTH EVERY DAY (Patient taking differently: Take 75 mg by mouth daily.)   [DISCONTINUED] DULoxetine  (CYMBALTA ) 60 MG capsule Take 60 mg by mouth daily.  (Patient not taking: Reported on 09/12/2020)   [DISCONTINUED] famotidine  (PEPCID ) 20 MG tablet Take 20 mg by mouth at bedtime.    [DISCONTINUED] fluticasone  (FLONASE ) 50 MCG/ACT nasal spray Place 1 spray into both nostrils daily as needed for allergies or rhinitis (congestion).    [DISCONTINUED] furosemide  (LASIX ) 20 MG tablet Take 1 tablet (20 mg) by mouth once daily x 3 days, then take 1 tablet (20 mg) once daily as needed for swelling (Patient not taking: No sig reported)   [DISCONTINUED] pregabalin  (LYRICA ) 50 MG capsule 50 mg during day, 100 mg at night   No facility-administered encounter medications on file as of 06/25/2020.      Objective:   Vitals:   06/25/20 1436 06/25/20 1438  BP: 118/72 118/72  Pulse: 85 82  Temp:  98.1 F (36.7 C)  Height:  5' 4.5 (1.638 m)  Weight:  205 lb (93 kg)  SpO2: 100% 100%  TempSrc:  Temporal  BMI (Calculated):  34.66     Physical exam documentation is limited by delayed entry of information.

## 2020-06-30 ENCOUNTER — Ambulatory Visit: Payer: 59 | Admitting: *Deleted

## 2020-06-30 DIAGNOSIS — I63219 Cerebral infarction due to unspecified occlusion or stenosis of unspecified vertebral arteries: Secondary | ICD-10-CM

## 2020-07-01 LAB — CUP PACEART REMOTE DEVICE CHECK
Date Time Interrogation Session: 20210725231556
Implantable Pulse Generator Implant Date: 20180918

## 2020-07-15 ENCOUNTER — Telehealth: Payer: Self-pay

## 2020-07-15 NOTE — Telephone Encounter (Signed)
Pt is aware of date/time of covid test prior to PFT.  

## 2020-07-18 ENCOUNTER — Other Ambulatory Visit
Admission: RE | Admit: 2020-07-18 | Discharge: 2020-07-18 | Disposition: A | Discharge status: Home / Self Care | Payer: Medicare Other | Source: Ambulatory Visit | Attending: Pulmonary Disease | Admitting: Pulmonary Disease

## 2020-07-18 ENCOUNTER — Other Ambulatory Visit: Payer: Self-pay

## 2020-07-18 DIAGNOSIS — Z01812 Encounter for preprocedural laboratory examination: Secondary | ICD-10-CM | POA: Insufficient documentation

## 2020-07-18 DIAGNOSIS — Z20822 Contact with and (suspected) exposure to covid-19: Secondary | ICD-10-CM | POA: Diagnosis not present

## 2020-07-19 LAB — SARS CORONAVIRUS 2 (TAT 6-24 HRS): SARS Coronavirus 2: NEGATIVE

## 2020-07-21 ENCOUNTER — Ambulatory Visit: Admission: RE | Admit: 2020-07-21 | Payer: Medicare Other | Source: Ambulatory Visit

## 2020-07-21 ENCOUNTER — Ambulatory Visit: Payer: Medicare Other

## 2020-07-21 ENCOUNTER — Telehealth: Payer: Self-pay | Admitting: Pulmonary Disease

## 2020-07-21 NOTE — Telephone Encounter (Signed)
Melody Ochoa with Cardiopulmonary with [facility] called and stated that patient showed up for COVID test on Friday, however, did not show up for her PFT and 2D Echo which was scheduled for this morning 07/21/2020.   Just FYI for physician. Rhonda J Cobb

## 2020-07-21 NOTE — Telephone Encounter (Signed)
Called and spoke with pt's husband and he wrote down dates of R/S PFT and 2D Echo.  R/S to Monday 08/25/2020 at 9:15 for PFT and 11:00 for 2D Echo at [facility]. COVID Test to be done on Friday 08/22/2020 at the [place] between hours of 8:00 am to 1:00 pm, stay in car and circle around drive.  Message sent to [facility] testing to schedule COVID Test for 08/22/2020.   Information mailed to patient.  Nothing else needed at this time. Rhonda J Cobb

## 2020-07-21 NOTE — Telephone Encounter (Signed)
Lets F/U with the patient to see what happened.

## 2020-07-21 NOTE — Telephone Encounter (Signed)
Spoke to patient, who stated that she had both PFT and echo wrote down on her calender for next Monday.Suanne Marker, can you reschedule? Thanks

## 2020-07-21 NOTE — Telephone Encounter (Signed)
Routing to Dr. Gonzalez as an FYI 

## 2020-07-22 ENCOUNTER — Encounter: Payer: Self-pay | Admitting: Family

## 2020-07-23 ENCOUNTER — Encounter: Payer: Self-pay | Admitting: Family

## 2020-07-23 ENCOUNTER — Ambulatory Visit (INDEPENDENT_AMBULATORY_CARE_PROVIDER_SITE_OTHER): Payer: Medicare Other | Admitting: Family

## 2020-07-23 ENCOUNTER — Other Ambulatory Visit: Payer: Self-pay

## 2020-07-23 DIAGNOSIS — M19012 Primary osteoarthritis, left shoulder: Secondary | ICD-10-CM | POA: Diagnosis not present

## 2020-07-23 DIAGNOSIS — N189 Chronic kidney disease, unspecified: Secondary | ICD-10-CM

## 2020-07-23 NOTE — Progress Notes (Signed)
Subjective:    Patient ID: Melody Ochoa, female    DOB: 1941/10/01, 79 y.o.   MRN: [id number]  CC: Melody Ochoa is a 79 y.o. female who presents today for follow up.   HPI: Left shoulder is most problematic. Never heard from [facility] in regards to second opinion. She had seen Dr Marlou Sa in [place] last year after abnormal Xray.  Continues to have left shoulder pain. Describes pain with movement. She can hear cracking noise. She recently had injection with Dr Holley Raring however didn't find much relief. Left hip has improved.  Walking with cane or walker No recent falls.  Didn't care for PT which she did after hip replacement in 2017.  Not taking mobic due to stomach upset. Compliant with cymbalta, lyrica which has been somewhat helpful.  She continues to have dyspnea on exertion. No CP.      CKD  Left humerus 05/07/2019 showed upper clavicular and glenohumeral degenerative change,sclerotic focus   Follows with Dr Holley Raring  Dr Caryl Comes 06/2020; discussed DOE. Dicussed midodrine, raising HOB. No interval afib  Follows with Dr Patsey Berthold for sob Echo and PFTs scheduled for 08/25/20  Former smoker.  HISTORY:  Past Medical History:  Diagnosis Date  . Arthritis   . Atheromatous plaque 10/18/2017  . Back pain   . Cancer ([facility])    skin  . Depression   . Diverticulosis   . Dizziness    patient had episode of dizziness when came in room. hx past no dx  . GERD (gastroesophageal reflux disease)   . Heart murmur   . Hyperlipidemia 10/18/2017  . PONV (postoperative nausea and vomiting)    yrs ago  . Stroke [facility])    Past Surgical History:  Procedure Laterality Date  . BACK SURGERY  13  . BREAST BIOPSY Bilateral    cores "years ago"  . BREAST EXCISIONAL BIOPSY Left 90's  . CATARACT EXTRACTION W/PHACO Left 12/20/2018   Procedure: CATARACT EXTRACTION PHACO AND INTRAOCULAR LENS PLACEMENT ([facility]) LEFT TOPICAL;  Surgeon: Leandrew Koyanagi, MD;  Location: [facility];   Service: Ophthalmology;  Laterality: Left;  . CATARACT EXTRACTION W/PHACO Right 01/24/2019   Procedure: CATARACT EXTRACTION PHACO AND INTRAOCULAR LENS PLACEMENT ([facility]) RIGHT;  Surgeon: Leandrew Koyanagi, MD;  Location: [facility];  Service: Ophthalmology;  Laterality: Right;  . CERVICAL DISC SURGERY  09  . COLONOSCOPY WITH PROPOFOL N/A 01/13/2016   Procedure: COLONOSCOPY WITH PROPOFOL;  Surgeon: [name], MD;  Location: [facility] ENDOSCOPY;  Service: Endoscopy;  Laterality: N/A;  . COLONOSCOPY WITH PROPOFOL N/A 02/19/2020   Procedure: COLONOSCOPY WITH PROPOFOL;  Surgeon: [name], MD;  Location: [facility] ENDOSCOPY;  Service: Gastroenterology;  Laterality: N/A;  . FACELIFT  94  . LOOP RECORDER INSERTION N/A 08/23/2017   Procedure: LOOP RECORDER INSERTION;  Surgeon: Thompson Grayer, MD;  Location: [facility] CV LAB;  Service: Cardiovascular;  Laterality: N/A;  . TEE WITHOUT CARDIOVERSION N/A 08/12/2017   Procedure: TRANSESOPHAGEAL ECHOCARDIOGRAM (TEE);  Surgeon: Sueanne Margarita, MD;  Location: [facility] ENDOSCOPY;  Service: Cardiovascular;  Laterality: N/A;  . TONSILLECTOMY    . TOTAL HIP ARTHROPLASTY Right 04/22/2016   Procedure: TOTAL HIP ARTHROPLASTY;  Surgeon: Corky Mull, MD;  Location: [facility] ORS;  Service: Orthopedics;  Laterality: Right;   Family History  Problem Relation Age of Onset  . Lung cancer Father   . Aneurysm Brother   . Stroke Brother   . Diabetes Maternal Grandmother   . Kidney disease Maternal Grandfather   . Hallock  syndrome Paternal Grandmother   . Dementia Paternal Grandfather   . Breast cancer Maternal Aunt 52    Allergies: Aleve [naproxen sodium], Aspirin, Celebrex [celecoxib], Effexor [venlafaxine], Gabapentin, Hydrocodone-homatropine, Ibuprofen, Mobic [meloxicam], Tape, Vioxx [rofecoxib], Other, and Tramadol Current Outpatient Medications on File Prior to Visit  Medication Sig Dispense Refill  . albuterol (VENTOLIN HFA) 108 (90 Base) MCG/ACT inhaler TAKE 2 PUFFS BY  MOUTH EVERY 6 HOURS AS NEEDED FOR WHEEZE OR SHORTNESS OF BREATH 18 g 2  . atorvastatin (LIPITOR) 80 MG tablet TAKE 1 TABLET BY MOUTH DAILY AT 6 PM. NEEDS OFFICE VISIT FOR FURTHER REFILLS. 2ND ATTEMPT 90 tablet 2  . buPROPion (WELLBUTRIN XL) 300 MG 24 hr tablet Take 300 mg by mouth daily.     . clopidogrel (PLAVIX) 75 MG tablet TAKE 1 TABLET BY MOUTH EVERY DAY 90 tablet 3  . DULoxetine (CYMBALTA) 60 MG capsule Take 60 mg by mouth daily.     . famotidine (PEPCID) 20 MG tablet Take 20 mg by mouth at bedtime.   12  . fluticasone (FLONASE) 50 MCG/ACT nasal spray Place 1 spray into both nostrils daily as needed for allergies or rhinitis (congestion).     . furosemide (LASIX) 20 MG tablet Take 1 tablet (20 mg) by mouth once daily x 3 days, then take 1 tablet (20 mg) once daily as needed for swelling 10 tablet 0  . mupirocin ointment (BACTROBAN) 2 % APPLY A SMALL AMOUNT TO AFFECTED AREA ONCE A DAY TO EXCISION SITE    . pregabalin (LYRICA) 50 MG capsule 50 mg during day, 100 mg at night 90 capsule 3   No current facility-administered medications on file prior to visit.    Social History   Tobacco Use  . Smoking status: Former Smoker    Packs/day: 0.50    Years: 40.00    Pack years: 20.00    Types: Cigarettes    Quit date: 11/13/1996    Years since quitting: 23.7  . Smokeless tobacco: Never Used  Vaping Use  . Vaping Use: Never used  Substance Use Topics  . Alcohol use: Yes    Comment: occ wine  . Drug use: No    Review of Systems  Constitutional: Negative for chills and fever.  Respiratory: Positive for shortness of breath. Negative for cough.   Cardiovascular: Negative for chest pain and palpitations.  Gastrointestinal: Negative for nausea and vomiting.      Objective:    BP 124/68 (BP Location: Left Arm, Patient Position: Sitting)   Pulse 86   Temp 98.1 F (36.7 C)   Ht 5' 4.49" (1.638 m)   Wt 206 lb (93.4 kg)   SpO2 99%   BMI 34.83 kg/m  BP Readings from Last 3 Encounters:   07/23/20 124/68  06/25/20 118/72  06/10/20 110/70   Wt Readings from Last 3 Encounters:  07/23/20 206 lb (93.4 kg)  06/25/20 205 lb (93 kg)  06/10/20 180 lb (81.6 kg)    Physical Exam Vitals reviewed.  Constitutional:      Appearance: She is well-developed.  Eyes:     Conjunctiva/sclera: Conjunctivae normal.  Cardiovascular:     Rate and Rhythm: Normal rate and regular rhythm.     Pulses: Normal pulses.     Heart sounds: Normal heart sounds.  Pulmonary:     Effort: Pulmonary effort is normal.     Breath sounds: Normal breath sounds. No wheezing, rhonchi or rales.  Skin:    General: Skin is warm and dry.  Neurological:     Mental Status: She is alert.  Psychiatric:        Speech: Speech normal.        Behavior: Behavior normal.        Thought Content: Thought content normal.        Assessment & Plan:   Problem List Items Addressed This Visit      Musculoskeletal and Integument   Primary osteoarthritis of left shoulder    Chronic. Pending consult with [facility] as well as she doesn't want to travel to [place] anymore. I have sent Melody Ochoa a note regarding this.  Advised to continue to follow with pain management in addition too.        Genitourinary   CKD (chronic kidney disease)    Stable.  Politely declines consult with nephrology at this time.  Education provided to stop NSAIDs, meloxicam has been discontinued from chart.          I have discontinued Melody Ochoa. Melody Ochoa's meloxicam. I am also having her maintain her DULoxetine, buPROPion, famotidine, fluticasone, albuterol, pregabalin, atorvastatin, furosemide, clopidogrel, and mupirocin ointment.   No orders of the defined types were placed in this encounter.   Return precautions given.   Risks, benefits, and alternatives of the medications and treatment plan prescribed today were discussed, and patient expressed understanding.   Education regarding symptom management and diagnosis given to  patient on AVS.  Continue to follow with Burnard Hawthorne, FNP for routine health maintenance.   [name] and I agreed with plan.   [name], FNP

## 2020-07-23 NOTE — Assessment & Plan Note (Signed)
Chronic. Pending consult with [facility] as well as she doesn't want to travel to [place] anymore. I have sent Rasheed[facility] a note regarding this.  Advised to continue to follow with pain management in addition too.

## 2020-07-23 NOTE — Patient Instructions (Addendum)
Referral in place to [facility]  Let us know if you dont hear back within a week in regards to an appointment being scheduled.   Please call  and schedule your 3D mammogram, bone density scan as discussed.   Melody  [facility] [place], [place]  Call me when you are ready for nephrology consult.

## 2020-07-23 NOTE — Assessment & Plan Note (Signed)
Stable.  Politely declines consult with nephrology at this time.  Education provided to stop NSAIDs, meloxicam has been discontinued from chart.

## 2020-07-28 ENCOUNTER — Telehealth: Payer: Self-pay | Admitting: Family

## 2020-07-28 NOTE — Telephone Encounter (Signed)
FYI

## 2020-07-28 NOTE — Telephone Encounter (Signed)
Call pt Appears she home screen for peripheral artery disease showing moderate I would advise vascular consult for further studies  Is she okay with that?

## 2020-07-28 NOTE — Telephone Encounter (Signed)
[facility] housecall from [employer] [facility] Np her PAD screening her right side 0.69 her left side 0.89 both moderate   Contact [phone]

## 2020-07-29 ENCOUNTER — Telehealth: Payer: Self-pay | Admitting: Family

## 2020-07-29 DIAGNOSIS — I10 Essential (primary) hypertension: Secondary | ICD-10-CM

## 2020-07-29 NOTE — Telephone Encounter (Signed)
Patient aware.

## 2020-07-29 NOTE — Telephone Encounter (Signed)
Patient is okay with pursuing vascular consult.

## 2020-07-29 NOTE — Telephone Encounter (Signed)
Call pt Vascular referral placed Let us know if you dont hear back within a week in regards to an appointment being scheduled.

## 2020-07-30 NOTE — Telephone Encounter (Signed)
Vas ref placed

## 2020-07-31 ENCOUNTER — Ambulatory Visit: Payer: Medicare Other

## 2020-08-04 ENCOUNTER — Telehealth: Payer: Self-pay | Admitting: Family

## 2020-08-04 ENCOUNTER — Ambulatory Visit (INDEPENDENT_AMBULATORY_CARE_PROVIDER_SITE_OTHER): Payer: Medicare Other | Admitting: *Deleted

## 2020-08-04 DIAGNOSIS — I63219 Cerebral infarction due to unspecified occlusion or stenosis of unspecified vertebral arteries: Secondary | ICD-10-CM

## 2020-08-04 DIAGNOSIS — J8410 Pulmonary fibrosis, unspecified: Secondary | ICD-10-CM | POA: Diagnosis not present

## 2020-08-04 LAB — CUP PACEART REMOTE DEVICE CHECK
Date Time Interrogation Session: 20210827231836
Implantable Pulse Generator Implant Date: 20180918

## 2020-08-04 NOTE — Telephone Encounter (Signed)
Patient dropped off a handicapp renewal form to be filled out. Form is up front in color folder.

## 2020-08-04 NOTE — Telephone Encounter (Signed)
FYI I filled out most of this & placed in your folder to sign.

## 2020-08-05 ENCOUNTER — Telehealth: Payer: Self-pay

## 2020-08-05 NOTE — Telephone Encounter (Signed)
I spoke with patient's husband, Jeneen Rinks. I let him know that handicap placard form was ready to be picked up at our office. I will place upfront.

## 2020-08-06 NOTE — Progress Notes (Signed)
Carelink Summary Report / Loop Recorder 

## 2020-08-07 ENCOUNTER — Telehealth: Payer: Self-pay | Admitting: Pulmonary Disease

## 2020-08-07 ENCOUNTER — Other Ambulatory Visit: Payer: Self-pay

## 2020-08-07 DIAGNOSIS — J8410 Pulmonary fibrosis, unspecified: Secondary | ICD-10-CM

## 2020-08-07 DIAGNOSIS — R0602 Shortness of breath: Secondary | ICD-10-CM

## 2020-08-07 NOTE — Telephone Encounter (Signed)
Order has been placed for patient to get O2 at night. Nothing further needed at this time.

## 2020-08-07 NOTE — Telephone Encounter (Signed)
08/07/2020  Overnight symmetry results are listed below:  07/31/2020-overnight oximetry on room air-duration of sleep 6 hours and 50 minutes, time spent below 88% 48.5 minutes, SaO2 low 76%, average oxygen level 90.3%  Patient does qualify for oxygen at night based off of this test.  Would recommend the patient start 2 L of O2 at night. Will route to Dr. Patsey Berthold as Juluis Rainier.  Wyn Quaker, FNP

## 2020-08-13 ENCOUNTER — Ambulatory Visit (INDEPENDENT_AMBULATORY_CARE_PROVIDER_SITE_OTHER): Payer: Medicare Other | Admitting: Dermatology

## 2020-08-13 ENCOUNTER — Other Ambulatory Visit: Payer: Self-pay

## 2020-08-13 ENCOUNTER — Encounter: Payer: Self-pay | Admitting: Dermatology

## 2020-08-13 DIAGNOSIS — L720 Epidermal cyst: Secondary | ICD-10-CM

## 2020-08-13 DIAGNOSIS — L570 Actinic keratosis: Secondary | ICD-10-CM

## 2020-08-13 DIAGNOSIS — Z85828 Personal history of other malignant neoplasm of skin: Secondary | ICD-10-CM

## 2020-08-13 DIAGNOSIS — D1800 Hemangioma unspecified site: Secondary | ICD-10-CM

## 2020-08-13 DIAGNOSIS — Z1283 Encounter for screening for malignant neoplasm of skin: Secondary | ICD-10-CM

## 2020-08-13 DIAGNOSIS — L821 Other seborrheic keratosis: Secondary | ICD-10-CM

## 2020-08-13 DIAGNOSIS — R0602 Shortness of breath: Secondary | ICD-10-CM | POA: Diagnosis not present

## 2020-08-13 DIAGNOSIS — L820 Inflamed seborrheic keratosis: Secondary | ICD-10-CM

## 2020-08-13 DIAGNOSIS — D692 Other nonthrombocytopenic purpura: Secondary | ICD-10-CM

## 2020-08-13 DIAGNOSIS — L578 Other skin changes due to chronic exposure to nonionizing radiation: Secondary | ICD-10-CM

## 2020-08-13 DIAGNOSIS — L814 Other melanin hyperpigmentation: Secondary | ICD-10-CM

## 2020-08-13 DIAGNOSIS — D229 Melanocytic nevi, unspecified: Secondary | ICD-10-CM

## 2020-08-13 NOTE — Patient Instructions (Addendum)

## 2020-08-13 NOTE — Progress Notes (Signed)
Follow-Up Visit   Subjective  Melody Ochoa is a 79 y.o. female who presents for the following: Annual Exam (History of SCC - TBSE today). The patient presents for Total-Body Skin Exam (TBSE) for skin cancer screening and mole check.  The following portions of the chart were reviewed this encounter and updated as appropriate:  Tobacco  Allergies  Meds  Problems  Med Hx  Surg Hx  Fam Hx     Review of Systems:  No other skin or systemic complaints except as noted in HPI or Assessment and Plan.  Objective  Well appearing patient in no apparent distress; mood and affect are within normal limits.  A full examination was performed including scalp, head, eyes, ears, nose, lips, neck, chest, axillae, abdomen, back, buttocks, bilateral upper extremities, bilateral lower extremities, hands, feet, fingers, toes, fingernails, and toenails. All findings within normal limits unless otherwise noted below.  Objective  Back: 1.0 cm cystic papule x 2  Objective  Right Knee - Anterior (2): Erythematous keratotic or waxy stuck-on papule or plaque.   Objective  Face (15): Erythematous thin papules/macules with gritty scale.    Assessment & Plan    Purpura - Violaceous macules and patches - Benign - Related to age, sun damage and/or use of blood thinners - Observe - Can use OTC arnica containing moisturizer such as Dermend Bruise Formula if desired - Call for worsening or other concerns  Lentigines - Scattered tan macules - Discussed due to sun exposure - Benign, observe - Call for any changes  Seborrheic Keratoses - Stuck-on, waxy, tan-brown papules and plaques  - Discussed benign etiology and prognosis. - Observe - Call for any changes  Melanocytic Nevi - Tan-brown and/or pink-flesh-colored symmetric macules and papules - Benign appearing on exam today - Observation - Call clinic for new or changing moles - Recommend daily use of broad spectrum spf 30+ sunscreen to  sun-exposed areas.   Hemangiomas - Red papules - Discussed benign nature - Observe - Call for any changes  Actinic Damage - diffuse scaly erythematous macules with underlying dyspigmentation - Recommend daily broad spectrum sunscreen SPF 30+ to sun-exposed areas, reapply every 2 hours as needed.  - Call for new or changing lesions.  Skin cancer screening performed today.  Epidermal inclusion cyst Back  Benign  Inflamed seborrheic keratosis (2) Right Knee - Anterior  Destruction of lesion - Right Knee - Anterior Complexity: simple   Destruction method: cryotherapy   Informed consent: discussed and consent obtained   Timeout:  patient name, date of birth, surgical site, and procedure verified Lesion destroyed using liquid nitrogen: Yes   Region frozen until ice ball extended beyond lesion: Yes   Outcome: patient tolerated procedure well with no complications   Post-procedure details: wound care instructions given    AK (actinic keratosis) (15) Face  Destruction of lesion - Face Complexity: simple   Destruction method: cryotherapy   Informed consent: discussed and consent obtained   Timeout:  patient name, date of birth, surgical site, and procedure verified Lesion destroyed using liquid nitrogen: Yes   Region frozen until ice ball extended beyond lesion: Yes   Outcome: patient tolerated procedure well with no complications   Post-procedure details: wound care instructions given    Return in about 3 weeks (around 09/03/2020) for PDT to face then 4 month follow up with Dr. Nehemiah Massed.  I, Ashok Cordia, CMA, am acting as scribe for Sarina Ser, MD .  Documentation: I have reviewed the above documentation  for accuracy and completeness, and I agree with the above.  Sarina Ser, MD

## 2020-08-15 ENCOUNTER — Other Ambulatory Visit
Admission: RE | Admit: 2020-08-15 | Discharge: 2020-08-15 | Disposition: A | Discharge status: Home / Self Care | Payer: Medicare Other | Source: Ambulatory Visit | Attending: Surgery | Admitting: Surgery

## 2020-08-15 ENCOUNTER — Other Ambulatory Visit: Payer: Self-pay

## 2020-08-15 DIAGNOSIS — Z01812 Encounter for preprocedural laboratory examination: Secondary | ICD-10-CM | POA: Insufficient documentation

## 2020-08-15 DIAGNOSIS — Z20822 Contact with and (suspected) exposure to covid-19: Secondary | ICD-10-CM | POA: Insufficient documentation

## 2020-08-16 LAB — SARS CORONAVIRUS 2 (TAT 6-24 HRS): SARS Coronavirus 2: NEGATIVE

## 2020-08-21 ENCOUNTER — Telehealth: Payer: Self-pay

## 2020-08-21 NOTE — Telephone Encounter (Signed)
Patient is aware of date/time of covid test prior to PFT.  

## 2020-08-22 ENCOUNTER — Other Ambulatory Visit
Admission: RE | Admit: 2020-08-22 | Discharge: 2020-08-22 | Disposition: A | Discharge status: Home / Self Care | Payer: Medicare Other | Source: Ambulatory Visit | Attending: Pulmonary Disease | Admitting: Pulmonary Disease

## 2020-08-22 ENCOUNTER — Other Ambulatory Visit: Payer: Self-pay

## 2020-08-22 DIAGNOSIS — Z01812 Encounter for preprocedural laboratory examination: Secondary | ICD-10-CM | POA: Insufficient documentation

## 2020-08-22 DIAGNOSIS — Z20822 Contact with and (suspected) exposure to covid-19: Secondary | ICD-10-CM | POA: Insufficient documentation

## 2020-08-23 LAB — SARS CORONAVIRUS 2 (TAT 6-24 HRS): SARS Coronavirus 2: NEGATIVE

## 2020-08-25 ENCOUNTER — Ambulatory Visit: Payer: Medicare Other

## 2020-08-25 ENCOUNTER — Other Ambulatory Visit: Payer: Self-pay

## 2020-08-25 ENCOUNTER — Ambulatory Visit
Admission: RE | Admit: 2020-08-25 | Discharge: 2020-08-25 | Disposition: A | Discharge status: Home / Self Care | Payer: Medicare Other | Source: Ambulatory Visit | Attending: Pulmonary Disease | Admitting: Pulmonary Disease

## 2020-08-25 DIAGNOSIS — R0602 Shortness of breath: Secondary | ICD-10-CM

## 2020-08-25 DIAGNOSIS — I340 Nonrheumatic mitral (valve) insufficiency: Secondary | ICD-10-CM | POA: Insufficient documentation

## 2020-08-25 LAB — ECHOCARDIOGRAM COMPLETE
AR max vel: 1.25 cm2
AV Area VTI: 1.54 cm2
AV Area mean vel: 1.19 cm2
AV Mean grad: 4.8 mmHg
AV Peak grad: 8.3 mmHg
Ao pk vel: 1.44 m/s
Area-P 1/2: 3.11 cm2
S' Lateral: 3.67 cm

## 2020-08-25 MED ORDER — ALBUTEROL SULFATE (2.5 MG/3ML) 0.083% IN NEBU
2.5000 mg | INHALATION_SOLUTION | Freq: Once | RESPIRATORY_TRACT | Status: AC
  Administered 2020-08-25: 2.5 mg via RESPIRATORY_TRACT
  Filled 2020-08-25: qty 3

## 2020-08-25 NOTE — Progress Notes (Signed)
*  PRELIMINARY RESULTS* Echocardiogram 2D Echocardiogram has been performed.  Melody Ochoa 08/25/2020, 11:23 AM

## 2020-08-26 ENCOUNTER — Encounter: Payer: Self-pay | Admitting: Student in an Organized Health Care Education/Training Program

## 2020-08-26 ENCOUNTER — Ambulatory Visit
Payer: Medicare Other | Attending: Student in an Organized Health Care Education/Training Program | Admitting: Student in an Organized Health Care Education/Training Program

## 2020-08-26 VITALS — BP 144/81 | HR 73 | Temp 97.8°F | Resp 16 | Ht 64.5 in | Wt 200.0 lb

## 2020-08-26 DIAGNOSIS — M461 Sacroiliitis, not elsewhere classified: Secondary | ICD-10-CM

## 2020-08-26 DIAGNOSIS — M47818 Spondylosis without myelopathy or radiculopathy, sacral and sacrococcygeal region: Secondary | ICD-10-CM | POA: Diagnosis not present

## 2020-08-26 DIAGNOSIS — M533 Sacrococcygeal disorders, not elsewhere classified: Secondary | ICD-10-CM | POA: Diagnosis not present

## 2020-08-26 DIAGNOSIS — G8929 Other chronic pain: Secondary | ICD-10-CM

## 2020-08-26 DIAGNOSIS — M25512 Pain in left shoulder: Secondary | ICD-10-CM

## 2020-08-26 DIAGNOSIS — G894 Chronic pain syndrome: Secondary | ICD-10-CM

## 2020-08-26 NOTE — Progress Notes (Signed)
PROVIDER NOTE: Information contained herein reflects review and annotations entered in association with encounter. Interpretation of such information and data should be left to medically-trained personnel. Information provided to patient can be located elsewhere in the medical record under "Patient Instructions". Document created using STT-dictation technology, any transcriptional errors that may result from process are unintentional.    Patient: Melody Ochoa  Service Category: E/M  Provider: Gillis Santa, MD  DOB: [birth year]  DOS: 08/26/2020  Specialty: Interventional Pain Management  MRN: [id number]  Setting: Ambulatory outpatient  PCP: Melody Hawthorne, FNP  Type: Established Patient    Referring Provider: Burnard Hawthorne, FNP  Location: Office  Delivery: Face-to-face     HPI  Reason for encounter: Melody Ochoa, a 79 y.o. year old female, is here today for evaluation and management of her Arthritis of left sacroiliac joint [M47.818]. Melody Ochoa primary complain today is Hip Pain (left) and Shoulder Pain (left) Last encounter: Practice (05/29/2020). My last encounter with her was on 05/29/2020. Pertinent problems: Melody Ochoa has Spondylolisthesis of lumbar region; Failed back surgical syndrome; Low back pain; History of lumbar surgery; Lumbar radiculopathy; Foraminal stenosis of lumbar region; and Left shoulder pain on their pertinent problem list. Pain Assessment: Severity of Chronic pain is reported as a 8 /10. Location: Hip Left/denies. Onset: More than a month ago. Quality: Sharp. Timing: Intermittent. Modifying factor(s): rest, medication. Vitals:  height is 5' 4.5" (1.638 m) and weight is 200 lb (90.7 kg). Her temporal temperature is 97.8 F (36.6 C). Her blood pressure is 144/81 (abnormal) and her pulse is 73. Her respiration is 16 and oxygen saturation is 100%.   Patient follows up today with increased left hip, buttock, left groin pain thought to be related to SI joint  dysfunction, SI joint arthropathy.  Patient does have pain with weightbearing and states that her pain radiates from around her lateral hip to the anterior portion often going into her groin region.  Patient has a history of lower lumbar spine surgery, L2-L5 fusion.  She is also having persistent left shoulder pain.  She has received mild to moderate short-term benefit with previous shoulder steroid injections.  She states that she takes oxycodone very intermittently, only when she has severe pain flare.  She continues Lyrica 50 mg 3 times daily.  Her last oxycodone refill was 05/29/2020.   ROS  Constitutional: Denies any fever or chills Gastrointestinal: No reported hemesis, hematochezia, vomiting, or acute GI distress Musculoskeletal: Left hip, buttock, groin pain left shoulder pain Neurological: No reported episodes of acute onset apraxia, aphasia, dysarthria, agnosia, amnesia, paralysis, loss of coordination, or loss of consciousness  Medication Review  DULoxetine, albuterol, atorvastatin, buPROPion, clopidogrel, famotidine, fluticasone, furosemide, mupirocin ointment, and pregabalin  History Review  Allergy: Melody Ochoa is allergic to aleve [naproxen sodium], aspirin, celebrex [celecoxib], effexor [venlafaxine], gabapentin, hydrocodone-homatropine, ibuprofen, mobic [meloxicam], tape, vioxx [rofecoxib], other, and tramadol. Drug: Melody Ochoa  reports no history of drug use. Alcohol:  reports current alcohol use. Tobacco:  reports that she quit smoking about 23 years ago. Her smoking use included cigarettes. She has a 20.00 pack-year smoking history. She has never used smokeless tobacco. Social: Melody Ochoa  reports that she quit smoking about 23 years ago. Her smoking use included cigarettes. She has a 20.00 pack-year smoking history. She has never used smokeless tobacco. She reports current alcohol use. She reports that she does not use drugs. Medical:  has a past medical history of  Arthritis, Atheromatous plaque (10/18/2017), Back  pain, Cancer ([place]), Depression, Diverticulosis, Dizziness, GERD (gastroesophageal reflux disease), Heart murmur, Hyperlipidemia (10/18/2017), PONV (postoperative nausea and vomiting), Squamous cell carcinoma of skin (11/16/2016), Squamous cell carcinoma of skin (02/05/2020), and Stroke [facility]). Surgical: Melody Ochoa  has a past surgical history that includes Tonsillectomy; Facelift (89); Cervical disc surgery (09); Back surgery (13); Colonoscopy with propofol (N/A, 01/13/2016); Total hip arthroplasty (Right, 04/22/2016); TEE without cardioversion (N/A, 08/12/2017); LOOP RECORDER INSERTION (N/A, 08/23/2017); Breast excisional biopsy (Left, 90's); Breast biopsy (Bilateral); Cataract extraction w/PHACO (Left, 12/20/2018); Cataract extraction w/PHACO (Right, 01/24/2019); and Colonoscopy with propofol (N/A, 02/19/2020). Family: family history includes Aneurysm in her brother; Breast cancer (age of onset: 64) in her maternal aunt; Cushing syndrome in her paternal grandmother; Dementia in her paternal grandfather; Diabetes in her maternal grandmother; Kidney disease in her maternal grandfather; Lung cancer in her father; Stroke in her brother.  Laboratory Chemistry Profile   Renal Lab Results  Component Value Date   BUN 24 (H) 05/21/2020   CREATININE 1.19 05/21/2020   GFR 43.64 (L) 05/21/2020   GFRAA 59 (L) 03/11/2020   GFRNONAA 51 (L) 03/11/2020     Hepatic Lab Results  Component Value Date   AST 17 04/22/2020   ALT 13 04/22/2020   ALBUMIN 4.0 04/22/2020   ALKPHOS 62 04/22/2020     Electrolytes Lab Results  Component Value Date   NA 137 05/21/2020   K 3.9 05/21/2020   CL 102 05/21/2020   CALCIUM 8.9 05/21/2020     Bone No results found for: VD25OH, VD125OH2TOT, NH6579UX8, BF3832NV9, 25OHVITD1, 25OHVITD2, 25OHVITD3, TESTOFREE, TESTOSTERONE   Inflammation (CRP: Acute Phase) (ESR: Chronic Phase) No results found for: CRP, ESRSEDRATE, LATICACIDVEN      Note: Above Lab results reviewed.  Recent Imaging Review  ECHOCARDIOGRAM COMPLETE    ECHOCARDIOGRAM REPORT       Patient Name:   Melody Ochoa Date of Exam: 08/25/2020 Medical Rec #:  [id number]        Height:       64.5 in Accession #:    [id number]       Weight:       206.0 lb Date of Birth:  30-Jan-1941        BSA:          1.992 m Patient Age:    66 years         BP:           Not listed in chart/Not listed in                                                chart mmHg Patient Gender: F                HR:           77 echo ecg bpm. Exam Location:  [facility]  Procedure: 2D Echo, Cardiac Doppler and Color Doppler  Indications:     Dyspnea 786.90                  Rule out Pulmonary hypertension   History:         Patient has prior history of Echocardiogram examinations, most                  recent 06/13/2019. Stroke; Signs/Symptoms:Murmur.   Sonographer:     Sherrie Sport RDCS (AE) Referring Phys:  [id number] CARMEN L GONZALEZ Diagnosing Phys: Kathlyn Sacramento MD  IMPRESSIONS   1. Left ventricular ejection fraction, by estimation, is 50 to 55%. The left ventricle has low normal function. The left ventricle has no regional wall motion abnormalities. Left ventricular diastolic parameters are consistent with Grade I diastolic  dysfunction (impaired relaxation).  2. Right ventricular systolic function is normal. The right ventricular size is normal. Tricuspid regurgitation signal is inadequate for assessing PA pressure.  3. Left atrial size was mildly dilated.  4. The mitral valve is normal in structure. Mild mitral valve regurgitation. No evidence of mitral stenosis.  5. The aortic valve is normal in structure. Aortic valve regurgitation is not visualized. Mild aortic valve sclerosis is present, with no evidence of aortic valve stenosis.  FINDINGS  Left Ventricle: Left ventricular ejection fraction, by estimation, is 50 to 55%. The left ventricle has low normal function. The left ventricle has no  regional wall motion abnormalities. The left ventricular internal cavity size was normal in size.  There is no left ventricular hypertrophy. Left ventricular diastolic parameters are consistent with Grade I diastolic dysfunction (impaired relaxation).  Right Ventricle: The right ventricular size is normal. No increase in right ventricular wall thickness. Right ventricular systolic function is normal. Tricuspid regurgitation signal is inadequate for assessing PA pressure.  Left Atrium: Left atrial size was mildly dilated.  Right Atrium: Right atrial size was normal in size.  Pericardium: There is no evidence of pericardial effusion.  Mitral Valve: The mitral valve is normal in structure. Mild mitral valve regurgitation. No evidence of mitral valve stenosis.  Tricuspid Valve: The tricuspid valve is normal in structure. Tricuspid valve regurgitation is trivial. No evidence of tricuspid stenosis.  Aortic Valve: The aortic valve is normal in structure. Aortic valve regurgitation is not visualized. Mild aortic valve sclerosis is present, with no evidence of aortic valve stenosis. Aortic valve mean gradient measures 4.8 mmHg. Aortic valve peak  gradient measures 8.3 mmHg. Aortic valve area, by VTI measures 1.54 cm.  Pulmonic Valve: The pulmonic valve was normal in structure. Pulmonic valve regurgitation is not visualized. No evidence of pulmonic stenosis.  Aorta: The aortic root is normal in size and structure.  Venous: The inferior vena cava was not well visualized.  IAS/Shunts: No atrial level shunt detected by color flow Doppler.    LEFT VENTRICLE PLAX 2D LVIDd:         5.05 cm  Diastology LVIDs:         3.67 cm  LV e' medial:    4.35 cm/s LV PW:         1.00 cm  LV E/e' medial:  15.4 LV IVS:        0.80 cm  LV e' lateral:   4.46 cm/s LVOT diam:     2.00 cm  LV E/e' lateral: 15.0 LV SV:         42 LV SV Index:   21 LVOT Area:     3.14 cm    RIGHT VENTRICLE RV Basal diam:  3.15  cm RV S prime:     10.80 cm/s TAPSE (M-mode): 3.3 cm  LEFT ATRIUM             Index       RIGHT ATRIUM          Index LA diam:        4.80 cm 2.41 cm/m  RA Area:     9.09 cm LA Vol (A2C):   47.1 ml  23.65 ml/m RA Volume:   16.40 ml 8.23 ml/m LA Vol (A4C):   43.5 ml 21.84 ml/m LA Biplane Vol: 49.5 ml 24.85 ml/m  AORTIC VALVE                    PULMONIC VALVE AV Area (Vmax):    1.25 cm     PV Vmax:        0.69 m/s AV Area (Vmean):   1.19 cm     PV Peak grad:   1.9 mmHg AV Area (VTI):     1.54 cm     RVOT Peak grad: 2 mmHg AV Vmax:           143.75 cm/s AV Vmean:          100.075 cm/s AV VTI:            0.272 m AV Peak Grad:      8.3 mmHg AV Mean Grad:      4.8 mmHg LVOT Vmax:         57.40 cm/s LVOT Vmean:        38.000 cm/s LVOT VTI:          0.133 m LVOT/AV VTI ratio: 0.49   AORTA Ao Root diam: 3.00 cm  MITRAL VALVE               TRICUSPID VALVE MV Area (PHT): 3.11 cm    TR Peak grad:   35.3 mmHg MV Decel Time: 244 msec    TR Vmax:        297.00 cm/s MV E velocity: 66.80 cm/s MV A velocity: 97.70 cm/s  SHUNTS MV E/A ratio:  0.68        Systemic VTI:  0.13 m                            Systemic Diam: 2.00 cm  Kathlyn Sacramento MD Electronically signed by Kathlyn Sacramento MD Signature Date/Time: 08/25/2020/1:18:23 PM      Final   Note: Reviewed        Physical Exam  General appearance: Well nourished, well developed, and well hydrated. In no apparent acute distress Mental status: Alert, oriented x 3 (person, place, & time)       Respiratory: No evidence of acute respiratory distress Eyes: PERLA Vitals: BP (!) 144/81 (BP Location: Right Arm, Patient Position: Sitting, Cuff Size: Normal)   Pulse 73   Temp 97.8 F (36.6 C) (Temporal)   Resp 16   Ht 5' 4.5" (1.638 m)   Wt 200 lb (90.7 kg)   SpO2 100%   BMI 33.80 kg/m  BMI: Estimated body mass index is 33.8 kg/m as calculated from the following:   Height as of this encounter: 5' 4.5" (1.638 m).   Weight as of  this encounter: 200 lb (90.7 kg). Ideal: Ideal body weight: 55.8 kg (123 lb 2 oz) Adjusted ideal body weight: 69.8 kg (153 lb 14 oz)   Lumbar Spine Area Exam  Skin & Axial Inspection: No masses, redness, or swelling Alignment: Symmetrical Functional ROM: Pain restricted ROM       Stability: No instability detected Muscle Tone/Strength: Functionally intact. No obvious neuro-muscular anomalies detected. Sensory (Neurological): Musculoskeletal pain pattern Palpation: No palpable anomalies       Provocative Tests: Hyperextension/rotation test: deferred today       Lumbar quadrant test (Kemp's test): deferred today       Lateral bending test:  deferred today       Patrick's Maneuver: (+) for left-sided S-I arthralgia             FABER* test: (+) for left-sided S-I arthralgia             S-I anterior distraction/compression test: (+)   S-I arthralgia/arthropathy Left S-I lateral compression test: Positive for   S-I arthralgia/arthropathy S-I Thigh-thrust test: deferred today         S-I Gaenslen's test: deferred today         *(Flexion, ABduction and External Rotation) Gait & Posture Assessment  Ambulation: Patient came in today in a wheel chair Gait: Limited. Using assistive device to ambulate Posture: Difficulty standing up straight, due to pain  Lower Extremity Exam    Side: Right lower extremity  Side: Left lower extremity  Stability: No instability observed          Stability: No instability observed          Skin & Extremity Inspection: Skin color, temperature, and hair growth are WNL. No peripheral edema or cyanosis. No masses, redness, swelling, asymmetry, or associated skin lesions. No contractures.  Skin & Extremity Inspection: Skin color, temperature, and hair growth are WNL. No peripheral edema or cyanosis. No masses, redness, swelling, asymmetry, or associated skin lesions. No contractures.  Functional ROM: Unrestricted ROM                  Functional ROM: Pain restricted ROM  for hip joint and sacroiliac joint          Muscle Tone/Strength: Functionally intact. No obvious neuro-muscular anomalies detected.  Muscle Tone/Strength: Functionally intact. No obvious neuro-muscular anomalies detected.  Sensory (Neurological): Unimpaired        Sensory (Neurological): Musculoskeletal pain pattern        DTR: Patellar: deferred today Achilles: deferred today Plantar: deferred today  DTR: Patellar: deferred today Achilles: deferred today Plantar: deferred today  Palpation: No palpable anomalies  Palpation: No palpable anomalies    Assessment   Status Diagnosis  Having a Flare-up Having a Flare-up Having a Flare-up 1. Arthritis of left sacroiliac joint   2. Chronic left SI joint pain   3. SI (sacroiliac) joint dysfunction   4. Chronic left shoulder pain   5. Chronic pain syndrome      Updated Problems: Problem  Arthritis of Left Sacroiliac Joint  Si (Sacroiliac) Joint Dysfunction  Chronic Pain Syndrome    Plan of Care  Melody Ochoa has a current medication list which includes the following long-term medication(s): atorvastatin, bupropion, clopidogrel, duloxetine, famotidine, pregabalin, albuterol, and furosemide.   Left SI joint dysfunction/arthropathy:.  Clinical exam and pain findings suggestive of SI joint arthropathy in the context of L2-L5 lumbar spinal fusion.  Recommend diagnostic left sacroiliac joint injection.  Patient instructed to continue Plavix.  Continue Lyrica and as needed oxycodone 5 mg for breakthrough pain as prescribed.  No refills needed.  Orders:  Orders Placed This Encounter  Procedures  . SACROILIAC JOINT INJECTION    Standing Status:   Future    Standing Expiration Date:   09/25/2020    Scheduling Instructions:     Side: LEFT     Sedation: without     Timeframe: ASAP    Order Specific Question:   Where will this procedure be performed?    Answer:   [facility]   Follow-up plan:   Return in about 2 weeks  (around 09/09/2020) for Left SI-J.  Status post left shoulder intra-articular injection, posterior approach on 08/08/2019, 09/17/2019- helped, repeat PRN.          Recent Visits Date Type Provider Dept  05/29/20 Telemedicine Melody Santa, MD [facility]  Showing recent visits within past 90 days and meeting all other requirements Today's Visits Date Type Provider Dept  08/26/20 Office Visit Melody Santa, MD [facility]  Showing today's visits and meeting all other requirements Future Appointments Date Type Provider Dept  09/24/20 Appointment Melody Santa, MD [facility]  Showing future appointments within next 90 days and meeting all other requirements  I discussed the assessment and treatment plan with the patient. The patient was provided an opportunity to ask questions and all were answered. The patient agreed with the plan and demonstrated an understanding of the instructions.  Patient advised to call back or seek an in-person evaluation if the symptoms or condition worsens.  Duration of encounter: 20 minutes.  Note by: Melody Santa, MD Date: 08/26/2020; Time: 10:32 AM

## 2020-08-26 NOTE — Patient Instructions (Signed)
Pain Management Discharge Instructions  General Discharge Instructions :  If you need to reach your doctor call: Monday-Friday 8:00 am - 4:00 pm at [phone] or toll free [phone].  After clinic hours [phone] to have operator reach doctor.  Bring all of your medication bottles to all your appointments in the pain clinic.  To cancel or reschedule your appointment with Pain Management please remember to call 24 hours in advance to avoid a fee.  Refer to the educational materials which you have been given on: General Risks, I had my Procedure. Discharge Instructions, Post Sedation.  Post Procedure Instructions:  The drugs you were given will stay in your system until tomorrow, so for the next 24 hours you should not drive, make any legal decisions or drink any alcoholic beverages.  You may eat anything you prefer, but it is better to start with liquids then soups and crackers, and gradually work up to solid foods.  Please notify your doctor immediately if you have any unusual bleeding, trouble breathing or pain that is not related to your normal pain.  Depending on the type of procedure that was done, some parts of your body may feel week and/or numb.  This usually clears up by tonight or the next day.  Walk with the use of an assistive device or accompanied by an adult for the 24 hours.  You may use ice on the affected area for the first 24 hours.  Put ice in a Ziploc bag and cover with a towel and place against area 15 minutes on 15 minutes off.  You may switch to heat after 24 hours.GENERAL RISKS AND COMPLICATIONS  What are the risk, side effects and possible complications? Generally speaking, most procedures are safe.  However, with any procedure there are risks, side effects, and the possibility of complications.  The risks and complications are dependent upon the sites that are lesioned, or the type of nerve block to be performed.  The closer the procedure is to the spine,  the more serious the risks are.  Great care is taken when placing the radio frequency needles, block needles or lesioning probes, but sometimes complications can occur. 1. Infection: Any time there is an injection through the skin, there is a risk of infection.  This is why sterile conditions are used for these blocks.  There are four possible types of infection. 1. Localized skin infection. 2. Central Nervous System Infection-This can be in the form of Meningitis, which can be deadly. 3. Epidural Infections-This can be in the form of an epidural abscess, which can cause pressure inside of the spine, causing compression of the spinal cord with subsequent paralysis. This would require an emergency surgery to decompress, and there are no guarantees that the patient would recover from the paralysis. 4. Discitis-This is an infection of the intervertebral discs.  It occurs in about 1% of discography procedures.  It is difficult to treat and it may lead to surgery.        2. Pain: the needles have to go through skin and soft tissues, will cause soreness.       3. Damage to internal structures:  The nerves to be lesioned may be near blood vessels or    other nerves which can be potentially damaged.       4. Bleeding: Bleeding is more common if the patient is taking blood thinners such as  aspirin, Coumadin, Ticiid, Plavix, etc., or if he/she have some genetic predisposition  such as   hemophilia. Bleeding into the spinal canal can cause compression of the spinal  cord with subsequent paralysis.  This would require an emergency surgery to  decompress and there are no guarantees that the patient would recover from the  paralysis.       5. Pneumothorax:  Puncturing of a lung is a possibility, every time a needle is introduced in  the area of the chest or upper back.  Pneumothorax refers to free air around the  collapsed lung(s), inside of the thoracic cavity (chest cavity).  Another two possible  complications  related to a similar event would include: Hemothorax and Chylothorax.   These are variations of the Pneumothorax, where instead of air around the collapsed  lung(s), you may have blood or chyle, respectively.       6. Spinal headaches: They may occur with any procedures in the area of the spine.       7. Persistent CSF (Cerebro-Spinal Fluid) leakage: This is a rare problem, but may occur  with prolonged intrathecal or epidural catheters either due to the formation of a fistulous  track or a dural tear.       8. Nerve damage: By working so close to the spinal cord, there is always a possibility of  nerve damage, which could be as serious as a permanent spinal cord injury with  paralysis.       9. Death:  Although rare, severe deadly allergic reactions known as "Anaphylactic  reaction" can occur to any of the medications used.      10. Worsening of the symptoms:  We can always make thing worse.  What are the chances of something like this happening? Chances of any of this occuring are extremely low.  By statistics, you have more of a chance of getting killed in a motor vehicle accident: while driving to the hospital than any of the above occurring .  Nevertheless, you should be aware that they are possibilities.  In general, it is similar to taking a shower.  Everybody knows that you can slip, hit your head and get killed.  Does that mean that you should not shower again?  Nevertheless always keep in mind that statistics do not mean anything if you happen to be on the wrong side of them.  Even if a procedure has a 1 (one) in a 1,000,000 (million) chance of going wrong, it you happen to be that one..Also, keep in mind that by statistics, you have more of a chance of having something go wrong when taking medications.  Who should not have this procedure? If you are on a blood thinning medication (e.g. Coumadin, Plavix, see list of "Blood Thinners"), or if you have an active infection going on, you should not  have the procedure.  If you are taking any blood thinners, please inform your physician.  How should I prepare for this procedure?  Do not eat or drink anything at least six hours prior to the procedure.  Bring a driver with you .  It cannot be a taxi.  Come accompanied by an adult that can drive you back, and that is strong enough to help you if your legs get weak or numb from the local anesthetic.  Take all of your medicines the morning of the procedure with just enough water to swallow them.  If you have diabetes, make sure that you are scheduled to have your procedure done first thing in the morning, whenever possible.  If you have diabetes,   take only half of your insulin dose and notify our nurse that you have done so as soon as you arrive at the clinic.  If you are diabetic, but only take blood sugar pills (oral hypoglycemic), then do not take them on the morning of your procedure.  You may take them after you have had the procedure.  Do not take aspirin or any aspirin-containing medications, at least eleven (11) days prior to the procedure.  They may prolong bleeding.  Wear loose fitting clothing that may be easy to take off and that you would not mind if it got stained with Betadine or blood.  Do not wear any jewelry or perfume  Remove any nail coloring.  It will interfere with some of our monitoring equipment.  NOTE: Remember that this is not meant to be interpreted as a complete list of all possible complications.  Unforeseen problems may occur.  BLOOD THINNERS The following drugs contain aspirin or other products, which can cause increased bleeding during surgery and should not be taken for 2 weeks prior to and 1 week after surgery.  If you should need take something for relief of minor pain, you may take acetaminophen which is found in Tylenol,m Datril, Anacin-3 and Panadol. It is not blood thinner. The products listed below are.  Do not take any of the products listed below  in addition to any listed on your instruction sheet.  A.P.C or A.P.C with Codeine Codeine Phosphate Capsules #3 Ibuprofen Ridaura  ABC compound Congesprin Imuran rimadil  Advil Cope Indocin Robaxisal  Alka-Seltzer Effervescent Pain Reliever and Antacid Coricidin or Coricidin-D  Indomethacin Rufen  Alka-Seltzer plus Cold Medicine Cosprin Ketoprofen S-A-C Tablets  Anacin Analgesic Tablets or Capsules Coumadin Korlgesic Salflex  Anacin Extra Strength Analgesic tablets or capsules CP-2 Tablets Lanoril Salicylate  Anaprox Cuprimine Capsules Levenox Salocol  Anexsia-D Dalteparin Magan Salsalate  Anodynos Darvon compound Magnesium Salicylate Sine-off  Ansaid Dasin Capsules Magsal Sodium Salicylate  Anturane Depen Capsules Marnal Soma  APF Arthritis pain formula Dewitt's Pills Measurin Stanback  Argesic Dia-Gesic Meclofenamic Sulfinpyrazone  Arthritis Bayer Timed Release Aspirin Diclofenac Meclomen Sulindac  Arthritis pain formula Anacin Dicumarol Medipren Supac  Analgesic (Safety coated) Arthralgen Diffunasal Mefanamic Suprofen  Arthritis Strength Bufferin Dihydrocodeine Mepro Compound Suprol  Arthropan liquid Dopirydamole Methcarbomol with Aspirin Synalgos  ASA tablets/Enseals Disalcid Micrainin Tagament  Ascriptin Doan's Midol Talwin  Ascriptin A/D Dolene Mobidin Tanderil  Ascriptin Extra Strength Dolobid Moblgesic Ticlid  Ascriptin with Codeine Doloprin or Doloprin with Codeine Momentum Tolectin  Asperbuf Duoprin Mono-gesic Trendar  Aspergum Duradyne Motrin or Motrin IB Triminicin  Aspirin plain, buffered or enteric coated Durasal Myochrisine Trigesic  Aspirin Suppositories Easprin Nalfon Trillsate  Aspirin with Codeine Ecotrin Regular or Extra Strength Naprosyn Uracel  Atromid-S Efficin Naproxen Ursinus  Auranofin Capsules Elmiron Neocylate Vanquish  Axotal Emagrin Norgesic Verin  Azathioprine Empirin or Empirin with Codeine Normiflo Vitamin E  Azolid Emprazil Nuprin Voltaren  Bayer  Aspirin plain, buffered or children's or timed BC Tablets or powders Encaprin Orgaran Warfarin Sodium  Buff-a-Comp Enoxaparin Orudis Zorpin  Buff-a-Comp with Codeine Equegesic Os-Cal-Gesic   Buffaprin Excedrin plain, buffered or Extra Strength Oxalid   Bufferin Arthritis Strength Feldene Oxphenbutazone   Bufferin plain or Extra Strength Feldene Capsules Oxycodone with Aspirin   Bufferin with Codeine Fenoprofen Fenoprofen Pabalate or Pabalate-SF   Buffets II Flogesic Panagesic   Buffinol plain or Extra Strength Florinal or Florinal with Codeine Panwarfarin   Buf-Tabs Flurbiprofen Penicillamine   Butalbital Compound Four-way cold tablets   Penicillin   Butazolidin Fragmin Pepto-Bismol   Carbenicillin Geminisyn Percodan   Carna Arthritis Reliever Geopen Persantine   Carprofen Gold's salt Persistin   Chloramphenicol Goody's Phenylbutazone   Chloromycetin Haltrain Piroxlcam   Clmetidine heparin Plaquenil   Cllnoril Hyco-pap Ponstel   Clofibrate Hydroxy chloroquine Propoxyphen         Before stopping any of these medications, be sure to consult the physician who ordered them.  Some, such as Coumadin (Warfarin) are ordered to prevent or treat serious conditions such as "deep thrombosis", "pumonary embolisms", and other heart problems.  The amount of time that you may need off of the medication may also vary with the medication and the reason for which you were taking it.  If you are taking any of these medications, please make sure you notify your pain physician before you undergo any procedures.         Sacroiliac (SI) Joint Injection Patient Information  Description: The sacroiliac joint connects the scrum (very low back and tailbone) to the ilium (a pelvic bone which also forms half of the hip joint).  Normally this joint experiences very little motion.  When this joint becomes inflamed or unstable low back and or hip and pelvis pain may result.  Injection of this joint with local  anesthetics (numbing medicines) and steroids can provide diagnostic information and reduce pain.  This injection is performed with the aid of x-ray guidance into the tailbone area while you are lying on your stomach.   You may experience an electrical sensation down the leg while this is being done.  You may also experience numbness.  We also may ask if we are reproducing your normal pain during the injection.  Conditions which may be treated SI injection:   Low back, buttock, hip or leg pain  Preparation for the Injection:  1. Do not eat any solid food or dairy products within 8 hours of your appointment.  2. You may drink clear liquids up to 3 hours before appointment.  Clear liquids include water, black coffee, juice or soda.  No milk or cream please. 3. You may take your regular medications, including pain medications with a sip of water before your appointment.  Diabetics should hold regular insulin (if take separately) and take 1/2 normal NPH dose the morning of the procedure.  Carry some sugar containing items with you to your appointment. 4. A driver must accompany you and be prepared to drive you home after your procedure. 5. Bring all of your current medications with you. 6. An IV may be inserted and sedation may be given at the discretion of the physician. 7. A blood pressure cuff, EKG and other monitors will often be applied during the procedure.  Some patients may need to have extra oxygen administered for a short period.  8. You will be asked to provide medical information, including your allergies, prior to the procedure.  We must know immediately if you are taking blood thinners (like Coumadin/Warfarin) or if you are allergic to IV iodine contrast (dye).  We must know if you could possible be pregnant.  Possible side effects:   Bleeding from needle site  Infection (rare, may require surgery)  Nerve injury (rare)  Numbness & tingling (temporary)  A brief convulsion or  seizure  Light-headedness (temporary)  Pain at injection site (several days)  Decreased blood pressure (temporary)  Weakness in the leg (temporary)   Call if you experience:   New onset weakness or numbness of an  extremity below the injection site that last more than 8 hours.  Hives or difficulty breathing ( go to the emergency room)  Inflammation or drainage at the injection site  Any new symptoms which are concerning to you  Please note:  Although the local anesthetic injected can often make your back/ hip/ buttock/ leg feel good for several hours after the injections, the pain will likely return.  It takes 3-7 days for steroids to work in the sacroiliac area.  You may not notice any pain relief for at least that one week.  If effective, we will often do a series of three injections spaced 3-6 weeks apart to maximally decrease your pain.  After the initial series, we generally will wait some months before a repeat injection of the same type.  If you have any questions, please call [phone] [facility]

## 2020-08-26 NOTE — Progress Notes (Signed)
Safety precautions to be maintained throughout the outpatient stay will include: orient to surroundings, keep bed in low position, maintain call bell within reach at all times, provide assistance with transfer out of bed and ambulation.  

## 2020-08-27 LAB — PULMONARY FUNCTION TEST ARMC ONLY
DL/VA % pred: 55 %
DL/VA: 2.27 ml/min/mmHg/L
DLCO unc % pred: 36 %
DLCO unc: 6.81 ml/min/mmHg
FEF 25-75 Post: 3.22 L/sec
FEF 25-75 Pre: 2.39 L/sec
FEF2575-%Change-Post: 34 %
FEF2575-%Pred-Post: 228 %
FEF2575-%Pred-Pre: 169 %
FEV1-%Change-Post: 9 %
FEV1-%Pred-Post: 81 %
FEV1-%Pred-Pre: 74 %
FEV1-Post: 1.58 L
FEV1-Pre: 1.44 L
FEV1FVC-%Change-Post: 1 %
FEV1FVC-%Pred-Pre: 120 %
FEV6-%Change-Post: 8 %
FEV6-%Pred-Post: 70 %
FEV6-%Pred-Pre: 65 %
FEV6-Post: 1.75 L
FEV6-Pre: 1.62 L
FEV6FVC-%Pred-Post: 105 %
FEV6FVC-%Pred-Pre: 105 %
FVC-%Change-Post: 8 %
FVC-%Pred-Post: 67 %
FVC-%Pred-Pre: 61 %
FVC-Post: 1.75 L
FVC-Pre: 1.62 L
Post FEV1/FVC ratio: 90 %
Post FEV6/FVC ratio: 100 %
Pre FEV1/FVC ratio: 89 %
Pre FEV6/FVC Ratio: 100 %
RV % pred: 4 %
RV: 0.12 L
TLC % pred: 52 %
TLC: 2.66 L

## 2020-08-28 ENCOUNTER — Encounter (INDEPENDENT_AMBULATORY_CARE_PROVIDER_SITE_OTHER): Payer: Medicare Other | Admitting: Vascular Surgery

## 2020-08-29 ENCOUNTER — Ambulatory Visit (INDEPENDENT_AMBULATORY_CARE_PROVIDER_SITE_OTHER): Payer: Medicare Other

## 2020-08-29 VITALS — Ht 64.5 in | Wt 200.0 lb

## 2020-08-29 DIAGNOSIS — Z0000 Encounter for general adult medical examination without abnormal findings: Secondary | ICD-10-CM

## 2020-08-29 NOTE — Patient Instructions (Addendum)
Melody Ochoa , Thank you for taking time to come for your Medicare Wellness Visit. I appreciate your ongoing commitment to your health goals. Please review the following plan we discussed and let me know if I can assist you in the future.   These are the goals we discussed: Goals    . DIET - INCREASE WATER INTAKE     Stay hydrated       This is a list of the screening recommended for you and due dates:  Health Maintenance  Topic Date Due  . Flu Shot  03/05/2021*  . DEXA scan (bone density measurement)  08/29/2021*  . Tetanus Vaccine  07/25/2027  . COVID-19 Vaccine  Completed  . Pneumonia vaccines  Completed  *Topic was postponed. The date shown is not the original due date.    Immunizations Immunization History  Administered Date(s) Administered  . Influenza, High Dose Seasonal PF 01/16/2018, 10/05/2018  . Influenza-Unspecified 09/28/2013, 10/09/2014, 09/08/2016, 09/27/2016, 09/12/2017, 01/16/2018  . PFIZER SARS-COV-2 Vaccination 01/31/2020, 02/21/2020  . Pneumococcal Conjugate-13 10/09/2014  . Pneumococcal Polysaccharide-23 02/14/2018, 10/01/2019  . Td 07/10/2014  . Tdap 07/24/2017   Keep all routine maintenance appointments.   Follow up 01/23/21  Advanced directives: yes, completed  Conditions/risks identified: none new  Next appointment: Follow up in one year for your annual wellness visit.   Preventive Care 80 Years and Older, Female Preventive care refers to lifestyle choices and visits with your health care provider that can promote health and wellness. What does preventive care include?  A yearly physical exam. This is also called an annual well check.  Dental exams once or twice a year.  Routine eye exams. Ask your health care provider how often you should have your eyes checked.  Personal lifestyle choices, including:  Daily care of your teeth and gums.  Regular physical activity.  Eating a healthy diet.  Avoiding tobacco and drug  use.  Limiting alcohol use.  Practicing safe sex.  Taking low-dose aspirin every day.  Taking vitamin and mineral supplements as recommended by your health care provider. What happens during an annual well check? The services and screenings done by your health care provider during your annual well check will depend on your age, overall health, lifestyle risk factors, and family history of disease. Counseling  Your health care provider may ask you questions about your:  Alcohol use.  Tobacco use.  Drug use.  Emotional well-being.  Home and relationship well-being.  Sexual activity.  Eating habits.  History of falls.  Memory and ability to understand (cognition).  Work and work Statistician.  Reproductive health. Screening  You may have the following tests or measurements:  Height, weight, and BMI.  Blood pressure.  Lipid and cholesterol levels. These may be checked every 5 years, or more frequently if you are over 49 years old.  Skin check.  Lung cancer screening. You may have this screening every year starting at age 80 if you have a 30-pack-year history of smoking and currently smoke or have quit within the past 15 years.  Fecal occult blood test (FOBT) of the stool. You may have this test every year starting at age 86.  Flexible sigmoidoscopy or colonoscopy. You may have a sigmoidoscopy every 5 years or a colonoscopy every 10 years starting at age 80.  Hepatitis C blood test.  Hepatitis B blood test.  Sexually transmitted disease (STD) testing.  Diabetes screening. This is done by checking your blood sugar (glucose) after you have not eaten for  a while (fasting). You may have this done every 1-3 years.  Bone density scan. This is done to screen for osteoporosis. You may have this done starting at age 80.  Mammogram. This may be done every 1-2 years. Talk to your health care provider about how often you should have regular mammograms. Talk with your  health care provider about your test results, treatment options, and if necessary, the need for more tests. Vaccines  Your health care provider may recommend certain vaccines, such as:  Influenza vaccine. This is recommended every year.  Tetanus, diphtheria, and acellular pertussis (Tdap, Td) vaccine. You may need a Td booster every 10 years.  Zoster vaccine. You may need this after age 80.  Pneumococcal 13-valent conjugate (PCV13) vaccine. One dose is recommended after age 80.  Pneumococcal polysaccharide (PPSV23) vaccine. One dose is recommended after age 80. Talk to your health care provider about which screenings and vaccines you need and how often you need them. This information is not intended to replace advice given to you by your health care provider. Make sure you discuss any questions you have with your health care provider. Document Released: 12/19/2015 Document Revised: 08/11/2016 Document Reviewed: 09/23/2015 Elsevier Interactive Patient Education  2017 [place] Prevention in the Home Falls can cause injuries. They can happen to people of all ages. There are many things you can do to make your home safe and to help prevent falls. What can I do on the outside of my home?  Regularly fix the edges of walkways and driveways and fix any cracks.  Remove anything that might make you trip as you walk through a door, such as a raised step or threshold.  Trim any bushes or trees on the path to your home.  Use bright outdoor lighting.  Clear any walking paths of anything that might make someone trip, such as rocks or tools.  Regularly check to see if handrails are loose or broken. Make sure that both sides of any steps have handrails.  Any raised decks and porches should have guardrails on the edges.  Have any leaves, snow, or ice cleared regularly.  Use sand or salt on walking paths during winter.  Clean up any spills in your garage right away. This includes oil  or grease spills. What can I do in the bathroom?  Use night lights.  Install grab bars by the toilet and in the tub and shower. Do not use towel bars as grab bars.  Use non-skid mats or decals in the tub or shower.  If you need to sit down in the shower, use a plastic, non-slip stool.  Keep the floor dry. Clean up any water that spills on the floor as soon as it happens.  Remove soap buildup in the tub or shower regularly.  Attach bath mats securely with double-sided non-slip rug tape.  Do not have throw rugs and other things on the floor that can make you trip. What can I do in the bedroom?  Use night lights.  Make sure that you have a light by your bed that is easy to reach.  Do not use any sheets or blankets that are too big for your bed. They should not hang down onto the floor.  Have a firm chair that has side arms. You can use this for support while you get dressed.  Do not have throw rugs and other things on the floor that can make you trip. What can I do in  the kitchen?  Clean up any spills right away.  Avoid walking on wet floors.  Keep items that you use a lot in easy-to-reach places.  If you need to reach something above you, use a strong step stool that has a grab bar.  Keep electrical cords out of the way.  Do not use floor polish or wax that makes floors slippery. If you must use wax, use non-skid floor wax.  Do not have throw rugs and other things on the floor that can make you trip. What can I do with my stairs?  Do not leave any items on the stairs.  Make sure that there are handrails on both sides of the stairs and use them. Fix handrails that are broken or loose. Make sure that handrails are as long as the stairways.  Check any carpeting to make sure that it is firmly attached to the stairs. Fix any carpet that is loose or worn.  Avoid having throw rugs at the top or bottom of the stairs. If you do have throw rugs, attach them to the floor with  carpet tape.  Make sure that you have a light switch at the top of the stairs and the bottom of the stairs. If you do not have them, ask someone to add them for you. What else can I do to help prevent falls?  Wear shoes that:  Do not have high heels.  Have rubber bottoms.  Are comfortable and fit you well.  Are closed at the toe. Do not wear sandals.  If you use a stepladder:  Make sure that it is fully opened. Do not climb a closed stepladder.  Make sure that both sides of the stepladder are locked into place.  Ask someone to hold it for you, if possible.  Clearly mark and make sure that you can see:  Any grab bars or handrails.  First and last steps.  Where the edge of each step is.  Use tools that help you move around (mobility aids) if they are needed. These include:  Canes.  Walkers.  Scooters.  Crutches.  Turn on the lights when you go into a dark area. Replace any light bulbs as soon as they burn out.  Set up your furniture so you have a clear path. Avoid moving your furniture around.  If any of your floors are uneven, fix them.  If there are any pets around you, be aware of where they are.  Review your medicines with your doctor. Some medicines can make you feel dizzy. This can increase your chance of falling. Ask your doctor what other things that you can do to help prevent falls. This information is not intended to replace advice given to you by your health care provider. Make sure you discuss any questions you have with your health care provider. Document Released: 09/18/2009 Document Revised: 04/29/2016 Document Reviewed: 12/27/2014 Elsevier Interactive Patient Education  2017 [employer].

## 2020-08-29 NOTE — Progress Notes (Addendum)
Subjective:   Melody Ochoa is a 79 y.o. female who presents for Medicare Annual (Subsequent) preventive examination.  Review of Systems    No ROS.  Medicare Wellness Virtual Visit.   Cardiac Risk Factors include: hypertension     Objective:    Today's Vitals   08/29/20 1104  Weight: 200 lb (90.7 kg)  Height: 5' 4.5" (1.638 m)   Body mass index is 33.8 kg/m.  Advanced Directives 08/29/2020 08/26/2020 03/11/2020 02/19/2020 09/30/2019 09/30/2019 08/29/2019  Does Patient Have a Medical Advance Directive? Yes Yes Yes Yes Yes Yes Yes  Type of Paramedic of [place];Living will [place];Living will [place];Living will - [place];Living will [place];Living will [place];Living will  Does patient want to make changes to medical advance directive? No - Patient declined - No - Patient declined - No - Guardian declined - No - Patient declined  Copy of [place] in Chart? Yes - validated most recent copy scanned in chart (See row information) No - copy requested No - copy requested - No - copy requested - Yes - validated most recent copy scanned in chart (See row information)  Would patient like information on creating a medical advance directive? - - - - No - Patient declined No - Patient declined -    Current Medications (verified) Outpatient Encounter Medications as of 08/29/2020  Medication Sig  . albuterol (VENTOLIN HFA) 108 (90 Base) MCG/ACT inhaler TAKE 2 PUFFS BY MOUTH EVERY 6 HOURS AS NEEDED FOR WHEEZE OR SHORTNESS OF BREATH (Patient not taking: Reported on 08/26/2020)  . atorvastatin (LIPITOR) 80 MG tablet TAKE 1 TABLET BY MOUTH DAILY AT 6 PM. NEEDS OFFICE VISIT FOR FURTHER REFILLS. 2ND ATTEMPT  . buPROPion (WELLBUTRIN XL) 300 MG 24 hr tablet Take 300 mg by mouth daily.   . clopidogrel (PLAVIX) 75 MG tablet TAKE 1 TABLET BY MOUTH EVERY DAY  .  DULoxetine (CYMBALTA) 60 MG capsule Take 60 mg by mouth daily.   . famotidine (PEPCID) 20 MG tablet Take 20 mg by mouth at bedtime.   . fluticasone (FLONASE) 50 MCG/ACT nasal spray Place 1 spray into both nostrils daily as needed for allergies or rhinitis (congestion).   . furosemide (LASIX) 20 MG tablet Take 1 tablet (20 mg) by mouth once daily x 3 days, then take 1 tablet (20 mg) once daily as needed for swelling (Patient not taking: Reported on 08/26/2020)  . mupirocin ointment (BACTROBAN) 2 % APPLY A SMALL AMOUNT TO AFFECTED AREA ONCE A DAY TO EXCISION SITE (Patient not taking: Reported on 08/26/2020)  . pregabalin (LYRICA) 50 MG capsule 50 mg during day, 100 mg at night   No facility-administered encounter medications on file as of 08/29/2020.    Allergies (verified) Aleve [naproxen sodium], Aspirin, Celebrex [celecoxib], Effexor [venlafaxine], Gabapentin, Hydrocodone-homatropine, Ibuprofen, Mobic [meloxicam], Tape, Vioxx [rofecoxib], Other, and Tramadol   History: Past Medical History:  Diagnosis Date  . Arthritis   . Atheromatous plaque 10/18/2017  . Back pain   . Cancer ([place])    skin  . Depression   . Diverticulosis   . Dizziness    patient had episode of dizziness when came in room. hx past no dx  . GERD (gastroesophageal reflux disease)   . Heart murmur   . Hyperlipidemia 10/18/2017  . PONV (postoperative nausea and vomiting)    yrs ago  . Squamous cell carcinoma of skin 11/16/2016  L chest parasternal  . Squamous cell carcinoma of skin 02/05/2020   L cheek inf to lat zygoma  . Stroke [facility])    Past Surgical History:  Procedure Laterality Date  . BACK SURGERY  13  . BREAST BIOPSY Bilateral    cores "years ago"  . BREAST EXCISIONAL BIOPSY Left 90's  . CATARACT EXTRACTION W/PHACO Left 12/20/2018   Procedure: CATARACT EXTRACTION PHACO AND INTRAOCULAR LENS PLACEMENT ([place]) LEFT TOPICAL;  Surgeon: Leandrew Koyanagi, MD;  Location: [place];  Service:  Ophthalmology;  Laterality: Left;  . CATARACT EXTRACTION W/PHACO Right 01/24/2019   Procedure: CATARACT EXTRACTION PHACO AND INTRAOCULAR LENS PLACEMENT ([place]) RIGHT;  Surgeon: Leandrew Koyanagi, MD;  Location: [place];  Service: Ophthalmology;  Laterality: Right;  . CERVICAL DISC SURGERY  09  . COLONOSCOPY WITH PROPOFOL N/A 01/13/2016   Procedure: COLONOSCOPY WITH PROPOFOL;  Surgeon: [name], MD;  Location: [facility] ENDOSCOPY;  Service: Endoscopy;  Laterality: N/A;  . COLONOSCOPY WITH PROPOFOL N/A 02/19/2020   Procedure: COLONOSCOPY WITH PROPOFOL;  Surgeon: [name], MD;  Location[facility] ENDOSCOPY;  Service: Gastroenterology;  Laterality: N/A;  . FACELIFT  94  . LOOP RECORDER INSERTION N/A 08/23/2017   Procedure: LOOP RECORDER INSERTION;  Surgeon: Thompson Grayer, MD;  Location: [place] CV LAB;  Service: Cardiovascular;  Laterality: N/A;  . TEE WITHOUT CARDIOVERSION N/A 08/12/2017   Procedure: TRANSESOPHAGEAL ECHOCARDIOGRAM (TEE);  Surgeon: Sueanne Margarita, MD;  Location: [facility] ENDOSCOPY;  Service: Cardiovascular;  Laterality: N/A;  . TONSILLECTOMY    . TOTAL HIP ARTHROPLASTY Right 04/22/2016   Procedure: TOTAL HIP ARTHROPLASTY;  Surgeon: Corky Mull, MD;  Location: [facility] ORS;  Service: Orthopedics;  Laterality: Right;   Family History  Problem Relation Age of Onset  . Lung cancer Father   . Aneurysm Brother   . Stroke Brother   . Diabetes Maternal Grandmother   . Kidney disease Maternal Grandfather   . Cushing syndrome Paternal Grandmother   . Dementia Paternal Grandfather   . Breast cancer Maternal Aunt 52   Social History   Socioeconomic History  . Marital status: Married    Spouse name: Not on file  . Number of children: Not on file  . Years of education: Not on file  . Highest education level: Not on file  Occupational History  . Not on file  Tobacco Use  . Smoking status: Former Smoker    Packs/day: 0.50    Years: 40.00    Pack years: 20.00    Types:  Cigarettes    Quit date: 11/13/1996    Years since quitting: 23.8  . Smokeless tobacco: Never Used  Vaping Use  . Vaping Use: Never used  Substance and Sexual Activity  . Alcohol use: Yes    Comment: occ wine  . Drug use: No  . Sexual activity: Not Currently  Other Topics Concern  . Not on file  Social History Narrative  . Not on file   Social Determinants of Health   Financial Resource Strain: Low Risk   . Difficulty of Paying Living Expenses: Not hard at all  Food Insecurity: No Food Insecurity  . Worried About Charity fundraiser in the Last Year: Never true  . Ran Out of Food in the Last Year: Never true  Transportation Needs: No Transportation Needs  . Lack of Transportation (Medical): No  . Lack of Transportation (Non-Medical): No  Physical Activity: Insufficiently Active  . Days of Exercise per Week: 7 days  . Minutes  of Exercise per Session: 10 min  Stress: No Stress Concern Present  . Feeling of Stress : Not at all  Social Connections: Unknown  . Frequency of Communication with Friends and Family: Not on file  . Frequency of Social Gatherings with Friends and Family: Not on file  . Attends Religious Services: Not on file  . Active Member of Clubs or Organizations: Not on file  . Attends Archivist Meetings: Not on file  . Marital Status: Married    Tobacco Counseling Counseling given: Not Answered   Clinical Intake:  Pre-visit preparation completed: Yes        Diabetes: No  How often do you need to have someone help you when you read instructions, pamphlets, or other written materials from your doctor or pharmacy?: 1 - Never   Interpreter Needed?: No      Activities of Daily Living In your present state of health, do you have any difficulty performing the following activities: 08/29/2020 09/30/2019  Hearing? N N  Vision? 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  N N  Walking or climbing stairs? Y Y  Dressing or bathing? N N  Doing errands, shopping? Tempie Donning  Preparing Food and eating ? Y -  Comment Dines out. Self feeds. -  Using the Toilet? N -  In the past six months, have you accidently leaked urine? Y -  Comment Managed with daily pad/brief -  Do you have problems with loss of bowel control? N -  Managing your Medications? N -  Managing your Finances? N -  Housekeeping or managing your Housekeeping? Y -  Comment Staff assist -  Some recent data might be hidden    Patient Care Team: Burnard Hawthorne, FNP as PCP - General (Family Medicine) Deboraha Sprang, MD as PCP - Electrophysiology (Cardiology)  Indicate any recent Medical Services you may have received from other than [facility] providers in the past year (date may be approximate).     Assessment:   This is a routine wellness examination for Melody Ochoa.  I connected with Melody Ochoa today by telephone and verified that I am speaking with the correct person using two identifiers. Location patient: home Location provider: work Persons participating in the virtual visit: patient, Marine scientist.    I discussed the limitations, risks, security and privacy concerns of performing an evaluation and management service by telephone and the availability of in person appointments. The patient expressed understanding and verbally consented to this telephonic visit.    Interactive audio and video telecommunications were attempted between this provider and patient, however failed, due to patient having technical difficulties OR patient did not have access to video capability.  We continued and completed visit with audio only.  Some vital signs may be absent or patient reported.   Hearing/Vision screen  Hearing Screening   125Hz  250Hz  500Hz  1000Hz  2000Hz  3000Hz  4000Hz  6000Hz  8000Hz   Right ear:           Left ear:           Comments: Patient is able to hear conversational tones without difficulty. No issues  reported.  Vision Screening Comments: Followed by [facility]  Wears corrective lenses  Virtual visit  Dietary issues and exercise activities discussed: Current Exercise Habits: Home exercise routine, Type of exercise: calisthenics, Time (Minutes): 10, Frequency (Times/Week): 7, Weekly Exercise (Minutes/Week): 70, Intensity: Mild  Goals    .  DIET - INCREASE WATER INTAKE     Stay hydrated      Depression Screen PHQ 2/9 Scores 08/29/2020 08/26/2020 07/23/2020 05/08/2020 08/29/2019 07/03/2019 12/08/2018  PHQ - 2 Score 0 0 0 0 0 0 0  PHQ- 9 Score - - - - 0 0 3    Fall Risk Fall Risk  08/29/2020 08/26/2020 07/23/2020 05/08/2020 09/17/2019  Falls in the past year? 1 1 1  0 1  Comment - - - - -  Number falls in past yr: 0 0 1 - 1  Comment - - - - -  Injury with Fall? 0 0 0 - 0  Comment - - - - -  Risk for fall due to : - History of fall(s) - - Impaired balance/gait  Follow up Falls evaluation completed Falls evaluation completed Falls evaluation completed - Falls evaluation completed   Handrails in use when climbing stairs? Yes Home free of loose throw rugs in walkways, pet beds, electrical cords, etc? Yes  Adequate lighting in your home to reduce risk of falls? Yes   ASSISTIVE DEVICES UTILIZED TO PREVENT FALLS: Life alert? No   Use of a cane, walker or w/c? Yes , cane Grab bars in the bathroom? Yes  Shower chair or bench in shower? Yes  Elevated toilet seat or a handicapped toilet? Yes   TIMED UP AND GO: Was the test performed? No . Virtual visit.   Cognitive Function: Patient is alert and oriented x3.  Denies difficulty focusing, making decisions, memory loss.  Enjoys completing crosswords puzzles.  MMSE - Mini Mental State Exam 08/29/2019 02/14/2018  Not completed: Refused -  Orientation to time - 5  Orientation to Place - 5  Registration - 3  Attention/ Calculation - 5  Recall - 2  Language- name 2 objects - 2  Language- repeat - 1  Language- follow 3 step command - 3   Language- read & follow direction - 1  Write a sentence - 1  Copy design - 1  Total score - 29       Immunizations Immunization History  Administered Date(s) Administered  . Influenza, High Dose Seasonal PF 01/16/2018, 10/05/2018  . Influenza-Unspecified 09/28/2013, 10/09/2014, 09/08/2016, 09/27/2016, 09/12/2017, 01/16/2018  . PFIZER SARS-COV-2 Vaccination 01/31/2020, 02/21/2020  . Pneumococcal Conjugate-13 10/09/2014  . Pneumococcal Polysaccharide-23 02/14/2018, 10/01/2019  . Td 07/10/2014  . Tdap 07/24/2017   Health Maintenance Health Maintenance  Topic Date Due  . INFLUENZA VACCINE  03/05/2021 (Originally 07/06/2020)  . DEXA SCAN  08/29/2021 (Originally 05/20/2005)  . TETANUS/TDAP  07/25/2027  . COVID-19 Vaccine  Completed  . PNA vac Low Risk Adult  Completed    Dental Screening: Recommended annual dental exams for proper oral hygiene. Visits every 12 months.   Community Resource Referral / Chronic Care Management: CRR required this visit?  No   CCM required this visit?  No      Plan:   Keep all routine maintenance appointments.   Follow up 01/23/21  I have personally reviewed and noted the following in the patient's chart:   . Medical and social history . Use of alcohol, tobacco or illicit drugs  . Current medications and supplements . Functional ability and status . Nutritional status . Physical activity . Advanced directives . List of other physicians . Hospitalizations, surgeries, and ER visits in previous 12 months . Vitals . Screenings to include cognitive, depression, and falls . Referrals and appointments  In addition, I have reviewed and discussed with patient certain preventive protocols,  quality metrics, and best practice recommendations. A written personalized care plan for preventive services as well as general preventive health recommendations were provided to patient via mychart.     Varney Biles, LPN   0/23/0172    Agree with  plan. [name], NP

## 2020-09-03 ENCOUNTER — Ambulatory Visit: Payer: Medicare Other

## 2020-09-08 ENCOUNTER — Other Ambulatory Visit: Payer: Self-pay | Admitting: Internal Medicine

## 2020-09-08 ENCOUNTER — Ambulatory Visit (INDEPENDENT_AMBULATORY_CARE_PROVIDER_SITE_OTHER): Payer: Medicare Other

## 2020-09-08 ENCOUNTER — Other Ambulatory Visit: Payer: Self-pay | Admitting: Student in an Organized Health Care Education/Training Program

## 2020-09-08 DIAGNOSIS — I63219 Cerebral infarction due to unspecified occlusion or stenosis of unspecified vertebral arteries: Secondary | ICD-10-CM | POA: Diagnosis not present

## 2020-09-08 DIAGNOSIS — J8410 Pulmonary fibrosis, unspecified: Secondary | ICD-10-CM

## 2020-09-08 DIAGNOSIS — R058 Other specified cough: Secondary | ICD-10-CM

## 2020-09-08 LAB — CUP PACEART REMOTE DEVICE CHECK
Date Time Interrogation Session: 20210929233307
Implantable Pulse Generator Implant Date: 20180918

## 2020-09-09 ENCOUNTER — Other Ambulatory Visit: Payer: Self-pay | Admitting: Family

## 2020-09-09 DIAGNOSIS — R059 Cough, unspecified: Secondary | ICD-10-CM

## 2020-09-10 ENCOUNTER — Other Ambulatory Visit: Payer: Self-pay

## 2020-09-10 NOTE — Progress Notes (Signed)
Carelink Summary Report / Loop Recorder 

## 2020-09-12 ENCOUNTER — Encounter: Payer: Self-pay | Admitting: Nurse Practitioner

## 2020-09-12 ENCOUNTER — Telehealth: Payer: Self-pay | Admitting: Student in an Organized Health Care Education/Training Program

## 2020-09-12 ENCOUNTER — Ambulatory Visit (INDEPENDENT_AMBULATORY_CARE_PROVIDER_SITE_OTHER): Payer: Medicare Other | Admitting: Nurse Practitioner

## 2020-09-12 ENCOUNTER — Other Ambulatory Visit: Payer: Self-pay

## 2020-09-12 VITALS — BP 101/63 | HR 87 | Ht 64.5 in | Wt 200.0 lb

## 2020-09-12 DIAGNOSIS — R053 Chronic cough: Secondary | ICD-10-CM

## 2020-09-12 DIAGNOSIS — J8410 Pulmonary fibrosis, unspecified: Secondary | ICD-10-CM | POA: Diagnosis not present

## 2020-09-12 DIAGNOSIS — R0602 Shortness of breath: Secondary | ICD-10-CM

## 2020-09-12 NOTE — Progress Notes (Addendum)
Virtual Visit via telephone Note  This visit type was conducted due to national recommendations for restrictions regarding the COVID-19 pandemic (e.g. social distancing).  This format is felt to be most appropriate for this patient at this time.  All issues noted in this document were discussed and addressed.  No physical exam was performed (except for noted visual exam findings with Video Visits).   I connected with@ on 09/12/20 at  9:30 AM EDT by a video enabled telemedicine application or telephone and verified that I am speaking with the correct person using two identifiers. Location patient: home Location provider: work or home office Persons participating in the virtual visit: patient, provider  I discussed the limitations, risks, security and privacy concerns of performing an evaluation and management service by telephone and the availability of in person appointments. I also discussed with the patient that there may be a patient responsible charge related to this service. The patient expressed understanding and agreed to proceed.  Interactive audio and video telecommunications were attempted between this provider and patient, however failed, due to patient did not have access to video capability.  We continued and completed visit with audio only.   Reason for visit: decreased energy and SOB x 1-2 weeks. Covid test negative x 3.    HPI: This 79 yo with hx of SOB, pulmonary fibrosis, pulmonary nodules, reports she has felt fatigue, hoarseness, and more SOB with exertion than usual onset x 1. Her PULM doctor delivered a breathing machine on Friday and she used the nasal canula that draws oxygen out of ambient air at night . That has not helped a lot. She is not always SOB- onset last week. Rare non productive cough and no exertional chest pain. When she does cough, it hurts in the lower lungs bilateral, more so on the right side. No nasal congestion, HA, fever/chills, body aches, loss of  taste/smell, or N/V. Normal diet/appetite. No edema. Able to lay flat on one pillow at night and never needs to sleep sitting up. No pulse oximetry in the house.   ROS: See pertinent positives and negatives per HPI.  Past Medical History:  Diagnosis Date  . Arthritis   . Atheromatous plaque 10/18/2017  . Back pain   . Cancer ([place])    skin  . Depression   . Diverticulosis   . Dizziness    patient had episode of dizziness when came in room. hx past no dx  . GERD (gastroesophageal reflux disease)   . Heart murmur   . Hyperlipidemia 10/18/2017  . PONV (postoperative nausea and vomiting)    yrs ago  . Squamous cell carcinoma of skin 11/16/2016   L chest parasternal  . Squamous cell carcinoma of skin 02/05/2020   L cheek inf to lat zygoma  . Stroke [facility])     Past Surgical History:  Procedure Laterality Date  . BACK SURGERY  13  . BREAST BIOPSY Bilateral    cores "years ago"  . BREAST EXCISIONAL BIOPSY Left 90's  . CATARACT EXTRACTION W/PHACO Left 12/20/2018   Procedure: CATARACT EXTRACTION PHACO AND INTRAOCULAR LENS PLACEMENT ([place]) LEFT TOPICAL;  Surgeon: Leandrew Koyanagi, MD;  Location: [place];  Service: Ophthalmology;  Laterality: Left;  . CATARACT EXTRACTION W/PHACO Right 01/24/2019   Procedure: CATARACT EXTRACTION PHACO AND INTRAOCULAR LENS PLACEMENT ([place]) RIGHT;  Surgeon: Leandrew Koyanagi, MD;  Location: [place];  Service: Ophthalmology;  Laterality: Right;  . CERVICAL DISC SURGERY  09  . COLONOSCOPY WITH PROPOFOL N/A 01/13/2016  Procedure: COLONOSCOPY WITH PROPOFOL;  Surgeon: [name], MD;  Location: [facility] ENDOSCOPY;  Service: Endoscopy;  Laterality: N/A;  . COLONOSCOPY WITH PROPOFOL N/A 02/19/2020   Procedure: COLONOSCOPY WITH PROPOFOL;  Surgeon: [name], MD;  [facility] ENDOSCOPY;  Service: Gastroenterology;  Laterality: N/A;  . FACELIFT  94  . LOOP RECORDER INSERTION N/A 08/23/2017   Procedure: LOOP RECORDER INSERTION;  Surgeon:  Thompson Grayer, MD;  Location: [place] CV LAB;  Service: Cardiovascular;  Laterality: N/A;  . TEE WITHOUT CARDIOVERSION N/A 08/12/2017   Procedure: TRANSESOPHAGEAL ECHOCARDIOGRAM (TEE);  Surgeon: Sueanne Margarita, MD;  Location: [facility] ENDOSCOPY;  Service: Cardiovascular;  Laterality: N/A;  . TONSILLECTOMY    . TOTAL HIP ARTHROPLASTY Right 04/22/2016   Procedure: TOTAL HIP ARTHROPLASTY;  Surgeon: Corky Mull, MD;  Location: [facility] ORS;  Service: Orthopedics;  Laterality: Right;    Family History  Problem Relation Age of Onset  . Lung cancer Father   . Aneurysm Brother   . Stroke Brother   . Diabetes Maternal Grandmother   . Kidney disease Maternal Grandfather   . Cushing syndrome Paternal Grandmother   . Dementia Paternal Grandfather   . Breast cancer Maternal Aunt 2    SOCIAL HX: Former smoker.   Current Outpatient Medications:  .  albuterol (VENTOLIN HFA) 108 (90 Base) MCG/ACT inhaler, TAKE 2 PUFFS BY MOUTH EVERY 6 HOURS AS NEEDED FOR WHEEZE OR SHORTNESS OF BREATH, Disp: 8.5 each, Rfl: 2 .  atorvastatin (LIPITOR) 80 MG tablet, TAKE 1 TABLET BY MOUTH DAILY AT 6 PM. NEEDS OFFICE VISIT FOR FURTHER REFILLS. 2ND ATTEMPT, Disp: 90 tablet, Rfl: 2 .  buPROPion (WELLBUTRIN XL) 300 MG 24 hr tablet, Take 300 mg by mouth daily. , Disp: , Rfl:  .  clopidogrel (PLAVIX) 75 MG tablet, TAKE 1 TABLET BY MOUTH EVERY DAY, Disp: 90 tablet, Rfl: 3 .  DULoxetine (CYMBALTA) 30 MG capsule, Take 30 mg by mouth daily., Disp: , Rfl:  .  famotidine (PEPCID) 20 MG tablet, Take 20 mg by mouth at bedtime. , Disp: , Rfl: 12 .  fluticasone (FLONASE) 50 MCG/ACT nasal spray, SPRAY 2 SPRAYS INTO EACH NOSTRIL EVERY DAY, Disp: 48 mL, Rfl: 1 .  furosemide (LASIX) 20 MG tablet, Take 1 tablet (20 mg) by mouth once daily x 3 days, then take 1 tablet (20 mg) once daily as needed for swelling, Disp: 10 tablet, Rfl: 0 .  mupirocin ointment (BACTROBAN) 2 %, APPLY A SMALL AMOUNT TO AFFECTED AREA ONCE A DAY TO EXCISION SITE, Disp: ,  Rfl:  .  pregabalin (LYRICA) 50 MG capsule, 50 mg during day, 100 mg at night, Disp: 90 capsule, Rfl: 3  EXAM:  VITALS per patient if applicable: Weight is 008 pounds pulse is 87 blood pressure 101/63  GENERAL: Sounds alert, oriented, and in no acute distress.  LUNGS: No sounds of  respiratory distress, breathing rate appears normal, no obvious gross SOB, gasping or wheezing. No cough noted. Talking in complete sentences.   PSYCH/NEURO: pleasant and cooperative,  speech and thought processing grossly intact  ASSESSMENT AND PLAN:  Discussed the following assessment and plan:  SOB (shortness of breath) on exertion - Plan: DG Chest 2 View  Pulmonary fibrosis (HCC)  Chronic cough  No problem-specific Assessment & Plan notes found for this encounter.  Worsening SOB x 1-2 wks. Pain with taking  a deep breath in bilateral lower lungs. No exertional CP, edema, orthopnea, or upper Resp. Sx. Neg COVID test x 3. Hx - pulmonary  fibrosis, bilateral breast biopsies, former smoker.  Advised:  Please purchase pulse oximeter at a drug store .  Please go to the [facility] entrance to go to Xray- for CXR.   I discussed the assessment and treatment plan with the patient. The patient was provided an opportunity to ask questions and all were answered. The patient agreed with the plan and demonstrated an understanding of the instructions.   The patient was advised to call back or seek an in-person evaluation if the symptoms worsen or if the condition fails to improve as anticipated.  Addendum: Chest x-ray returns abnormal with interval progression of coarse bilateral pulmonary infiltrate could represent progression of underlying fibrotic disease or superimposed infectious or inflammatory infiltrate.    I had a secure epic chat with Dr. Asencion Partridge and they will follow-up on this.  Their office spoke to her today.  The impression is this may be progression of her interstitial lung disease or  superimposed hypersensitivity pneumonitis.  Antibiotics were not recommended.  Patient was called and advised to follow-up with her pulmonologist. She is in agreement with this plan.   Time spent 20 min on the phone with this patient.   Denice Paradise, NP Adult Nurse Practitioner [place] [phone]

## 2020-09-12 NOTE — Patient Instructions (Addendum)
You do not have the symptoms of Covid and are not feeling ill. You  have taken a Covid test that was negative x 3. I suspect you have a flair of pulmonary fibrosis. Please go to the [facility] entrance today to get a chest  Xray.   Continue with your current medications: Albuterol inhaler, Flonase, and night time oxygen as directed.   Please purchase pulse oximeter at a drug store and record your pulse and oxygen saturations. If your oxygen level falls below 90% and stays there,  go to the ER  for urgent assistance.   If your symptoms get worse, go to Acute Care for an in-person exam.

## 2020-09-12 NOTE — Telephone Encounter (Signed)
Patient called stating her pharmacy says she does not have any pregabalin scripts to fill

## 2020-09-13 ENCOUNTER — Ambulatory Visit
Admission: RE | Admit: 2020-09-13 | Discharge: 2020-09-13 | Disposition: A | Discharge status: Home / Self Care | Payer: Medicare Other | Source: Ambulatory Visit | Attending: Nurse Practitioner | Admitting: Nurse Practitioner

## 2020-09-13 ENCOUNTER — Encounter
Admission: RE | Admit: 2020-09-13 | Discharge: 2020-09-13 | Disposition: A | Discharge status: Home / Self Care | Payer: Medicare Other | Attending: Pulmonary Disease | Admitting: Pulmonary Disease

## 2020-09-13 DIAGNOSIS — R0602 Shortness of breath: Secondary | ICD-10-CM

## 2020-09-13 DIAGNOSIS — R0600 Dyspnea, unspecified: Secondary | ICD-10-CM | POA: Diagnosis not present

## 2020-09-14 ENCOUNTER — Encounter: Payer: Self-pay | Admitting: Nurse Practitioner

## 2020-09-14 DIAGNOSIS — J8410 Pulmonary fibrosis, unspecified: Secondary | ICD-10-CM | POA: Insufficient documentation

## 2020-09-14 DIAGNOSIS — R053 Chronic cough: Secondary | ICD-10-CM | POA: Insufficient documentation

## 2020-09-15 ENCOUNTER — Telehealth: Payer: Self-pay | Admitting: Pulmonary Disease

## 2020-09-15 ENCOUNTER — Telehealth: Payer: Self-pay | Admitting: Nurse Practitioner

## 2020-09-15 ENCOUNTER — Other Ambulatory Visit: Payer: Self-pay | Admitting: Student in an Organized Health Care Education/Training Program

## 2020-09-15 MED ORDER — PREGABALIN 50 MG PO CAPS
ORAL_CAPSULE | ORAL | 3 refills | Status: DC
Start: 2020-09-15 — End: 2021-03-18

## 2020-09-15 NOTE — Telephone Encounter (Signed)
Called patient to let her know that pregabalin has been sent in for her.

## 2020-09-15 NOTE — Telephone Encounter (Signed)
FYI I have patient's mobility form for power wheelchair. She has appointment tomorrow.

## 2020-09-15 NOTE — Telephone Encounter (Signed)
I spoke with Melody Ochoa today for symptom update.  Since our telephone visit last Friday she is about the same.  She still has more shortness of breath than usual, and has pain in bilateral lower lungs when she takes a deep breath. Very little cough. No fever/chills.  She is using her inhaler, and her home nighttime oxygen as directed.   09/13/2020: CXR IMPRESSION: Stable pulmonary insufflation. Interval progression of a coarse bilateral pulmonary infiltrate representing either progression of underlying fibrotic disease or superimposed infectious or inflammatory infiltrate. This could be better assessed with high-resolution CT examination.  Plan: Epic Chat  today with Dr. Vernard Gambles :  She has a follow-up appointment with me. Is likely a flare from her interstitial lung disease. She was supposed to see my colleague Dr. Chase Caller but he is not coming to that clinic here for another month and a half. I schedule her with me. We talked to her today and she did not mention any pain or shortness of breath. We will review another follow-up high-resolution CT as there appears to be progression on chest x-ray. A possibility could be superimposed hypersensitivity pneumonitis. We will take care of this.   The patient was notified to follow up with [facility] and to contact them for symptom updates. She also sees [name] on 09/16/20 for routine f/up.

## 2020-09-15 NOTE — Progress Notes (Signed)
Patient notified

## 2020-09-15 NOTE — Progress Notes (Signed)
Requested Prescriptions   Signed Prescriptions Disp Refills  . pregabalin (LYRICA) 50 MG capsule 90 capsule 3    Sig: 50 mg during day, 100 mg at night

## 2020-09-16 ENCOUNTER — Telehealth: Payer: Self-pay

## 2020-09-16 ENCOUNTER — Ambulatory Visit: Payer: Medicare Other | Admitting: Family

## 2020-09-16 NOTE — Telephone Encounter (Signed)
I called and spoke with patient's husband. Pt was still sleeping, but he will have patient call back to reschedule for mobility form.

## 2020-09-16 NOTE — Telephone Encounter (Signed)
Called and spoke to patient, who is questioning if Dr. Patsey Berthold put her back on Spiriva because her pharmacy has spiriva ready for pickup and it is 500 dollars. Per our records, Melody Ochoa was last prescribed in 2020. Patient has been made aware of this information and voiced her understanding.  Patient has upcoming appt with Dr. Patsey Berthold on 09/17/2020 and will discuss this further during visit.  Nothing further needed.

## 2020-09-17 ENCOUNTER — Ambulatory Visit: Payer: Medicare Other | Admitting: Pulmonary Disease

## 2020-09-17 ENCOUNTER — Ambulatory Visit: Payer: Medicare Other

## 2020-09-17 ENCOUNTER — Other Ambulatory Visit: Payer: Self-pay

## 2020-09-17 ENCOUNTER — Emergency Department
Admission: EM | Admit: 2020-09-17 | Discharge: 2020-09-17 | Disposition: A | Discharge status: Home / Self Care | Payer: Medicare Other | Attending: Emergency Medicine | Admitting: Emergency Medicine

## 2020-09-17 ENCOUNTER — Emergency Department: Payer: Medicare Other

## 2020-09-17 DIAGNOSIS — R531 Weakness: Secondary | ICD-10-CM | POA: Diagnosis not present

## 2020-09-17 DIAGNOSIS — Z87891 Personal history of nicotine dependence: Secondary | ICD-10-CM | POA: Diagnosis not present

## 2020-09-17 DIAGNOSIS — Y92003 Bedroom of unspecified non-institutional (private) residence as the place of occurrence of the external cause: Secondary | ICD-10-CM | POA: Insufficient documentation

## 2020-09-17 DIAGNOSIS — S0993XA Unspecified injury of face, initial encounter: Secondary | ICD-10-CM | POA: Diagnosis not present

## 2020-09-17 DIAGNOSIS — Z85828 Personal history of other malignant neoplasm of skin: Secondary | ICD-10-CM | POA: Insufficient documentation

## 2020-09-17 DIAGNOSIS — W06XXXA Fall from bed, initial encounter: Secondary | ICD-10-CM | POA: Insufficient documentation

## 2020-09-17 DIAGNOSIS — Z043 Encounter for examination and observation following other accident: Secondary | ICD-10-CM | POA: Diagnosis not present

## 2020-09-17 DIAGNOSIS — Z96641 Presence of right artificial hip joint: Secondary | ICD-10-CM | POA: Insufficient documentation

## 2020-09-17 DIAGNOSIS — I129 Hypertensive chronic kidney disease with stage 1 through stage 4 chronic kidney disease, or unspecified chronic kidney disease: Secondary | ICD-10-CM | POA: Insufficient documentation

## 2020-09-17 DIAGNOSIS — S0083XA Contusion of other part of head, initial encounter: Secondary | ICD-10-CM | POA: Diagnosis not present

## 2020-09-17 DIAGNOSIS — N189 Chronic kidney disease, unspecified: Secondary | ICD-10-CM | POA: Insufficient documentation

## 2020-09-17 DIAGNOSIS — S0990XA Unspecified injury of head, initial encounter: Secondary | ICD-10-CM | POA: Diagnosis not present

## 2020-09-17 DIAGNOSIS — I10 Essential (primary) hypertension: Secondary | ICD-10-CM | POA: Diagnosis not present

## 2020-09-17 DIAGNOSIS — W19XXXA Unspecified fall, initial encounter: Secondary | ICD-10-CM

## 2020-09-17 LAB — BASIC METABOLIC PANEL
Anion gap: 9 (ref 5–15)
BUN: 19 mg/dL (ref 8–23)
CO2: 27 mmol/L (ref 22–32)
Calcium: 9.1 mg/dL (ref 8.9–10.3)
Chloride: 104 mmol/L (ref 98–111)
Creatinine, Ser: 1.34 mg/dL — ABNORMAL HIGH (ref 0.44–1.00)
GFR, Estimated: 37 mL/min — ABNORMAL LOW (ref >60)
Glucose, Bld: 104 mg/dL — ABNORMAL HIGH (ref 70–99)
Potassium: 3.9 mmol/L (ref 3.5–5.1)
Sodium: 140 mmol/L (ref 135–145)

## 2020-09-17 LAB — URINALYSIS, COMPLETE (UACMP) WITH MICROSCOPIC
Bilirubin Urine: NEGATIVE
Glucose, UA: NEGATIVE mg/dL
Hgb urine dipstick: NEGATIVE
Ketones, ur: NEGATIVE mg/dL
Leukocytes,Ua: NEGATIVE
Nitrite: NEGATIVE
Protein, ur: NEGATIVE mg/dL
Specific Gravity, Urine: 1.02 (ref 1.005–1.030)
pH: 5 (ref 5.0–8.0)

## 2020-09-17 LAB — CBC
HCT: 36.1 % (ref 36.0–46.0)
Hemoglobin: 11.7 g/dL — ABNORMAL LOW (ref 12.0–15.0)
MCH: 28.5 pg (ref 26.0–34.0)
MCHC: 32.4 g/dL (ref 30.0–36.0)
MCV: 87.8 fL (ref 80.0–100.0)
Platelets: 233 10*3/uL (ref 150–400)
RBC: 4.11 MIL/uL (ref 3.87–5.11)
RDW: 14.2 % (ref 11.5–15.5)
WBC: 7.7 10*3/uL (ref 4.0–10.5)
nRBC: 0 % (ref 0.0–0.2)

## 2020-09-17 LAB — PROTIME-INR
INR: 1 (ref 0.8–1.2)
Prothrombin Time: 12.5 seconds (ref 11.4–15.2)

## 2020-09-17 MED ORDER — LACTATED RINGERS IV BOLUS
1000.0000 mL | Freq: Once | INTRAVENOUS | Status: AC
  Administered 2020-09-17: 1000 mL via INTRAVENOUS

## 2020-09-17 NOTE — ED Triage Notes (Signed)
Pt comes via POV from home with c/o weakness following fall at home. Pt states she fell off bed and hit side table. Pt states confusion and unsteady. Pt has black left eye and is on blood thinners.  Pt taken to triage at this time. Orders placed for Ct head

## 2020-09-17 NOTE — ED Notes (Signed)
Blue top also sent  

## 2020-09-17 NOTE — ED Provider Notes (Signed)
[facility] Emergency Department Provider Note   ____________________________________________   First MD Initiated Contact with Patient 09/17/20 1504     (approximate)  I have reviewed the triage vital signs and the nursing notes.   HISTORY  Chief Complaint Fall    HPI Melody Ochoa is a 79 y.o. female with possible history of hypertension, hyperlipidemia, stroke, CKD, and pulmonary fibrosis on 2 L at night who presents to the ED complaining of fall.  Patient reports that sometime overnight she was attempting to get into bed when she sat halfway on the bed and slid off, striking her head.  She denies losing consciousness, now primarily complains of pain along the left side of her head but otherwise denies any other traumatic injuries.  She states she takes a blood thinner, but is not sure whether it is Coumadin or clopidogrel.  She denies any neck pain, numbness, weakness, chest pain, abdominal pain, or extremity pain.  She does states she has been feeling weaker than usual following the fall.  She denies any recent fevers, cough, chest pain, shortness of breath, dysuria, or hematuria.        Past Medical History:  Diagnosis Date  . Arthritis   . Atheromatous plaque 10/18/2017  . Back pain   . Cancer ([place])    skin  . Depression   . Diverticulosis   . Dizziness    patient had episode of dizziness when came in room. hx past no dx  . GERD (gastroesophageal reflux disease)   . Heart murmur   . Hyperlipidemia 10/18/2017  . PONV (postoperative nausea and vomiting)    yrs ago  . Squamous cell carcinoma of skin 11/16/2016   L chest parasternal  . Squamous cell carcinoma of skin 02/05/2020   L cheek inf to lat zygoma  . Stroke [facility])     Patient Active Problem List   Diagnosis Date Noted  . Pulmonary fibrosis ([place]) 09/14/2020  . Chronic cough 09/14/2020  . Arthritis of left sacroiliac joint 08/26/2020  . SI (sacroiliac) joint dysfunction  08/26/2020  . CKD (chronic kidney disease) 07/23/2020  . HTN (hypertension) 04/23/2020  . Positive fecal immunochemical test   . Angiodysplasia of intestinal tract   . Transient ischemic attack (TIA) 10/08/2019  . Transient right leg weakness 09/30/2019  . Peripheral neuropathy 08/10/2019  . Primary osteoarthritis of left shoulder 07/24/2019  . Elevated blood pressure reading 06/27/2019  . Left shoulder pain 05/04/2019  . Anemia 03/19/2019  . Long term current use of opiate analgesic 01/09/2019  . Foraminal stenosis of lumbar region 11/14/2018  . Chronic pain syndrome 09/12/2018  . SOB (shortness of breath) on exertion 08/11/2018  . Dysphagia 08/11/2018  . Cough 06/30/2018  . Rash 01/16/2018  . Hyperlipidemia 10/18/2017  . History of lumbar surgery 10/04/2017  . Depression, recurrent ([place]) 09/27/2017  . Pulmonary nodules 09/27/2017  . History of stroke 09/12/2017  . Stroke (cerebrum) ([place]) 08/10/2017  . Primary osteoarthritis of left hip 02/28/2017  . Strain of left hip 02/28/2017  . B12 deficiency 12/21/2016  . Generalized OA 05/13/2016  . Status post total replacement of right hip 04/22/2016  . Personal history of colonic polyps   . Greater trochanteric bursitis of both hips 01/09/2016  . Prediabetes 08/04/2015  . Thoracic aortic atherosclerosis ([place]) 06/16/2015  . 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  . UTI (lower urinary tract infection) 11/22/2014  . Low back pain 11/22/2014  . Spondylolisthesis of lumbar region 11/18/2014  . Lumbar  radiculopathy 08/21/2014  . Degeneration of lumbar or lumbosacral intervertebral disc 08/21/2014  . Left hip pain 08/21/2014  . Osteopenia 06/23/2014  . Major depression in remission ([place]) 06/23/2014  . Anxiety 06/23/2014  . Menopause 06/23/2014    Past Surgical History:  Procedure Laterality Date  . BACK SURGERY  13  . BREAST BIOPSY Bilateral    cores "years ago"  . BREAST EXCISIONAL BIOPSY Left 90's  . CATARACT EXTRACTION  W/PHACO Left 12/20/2018   Procedure: CATARACT EXTRACTION PHACO AND INTRAOCULAR LENS PLACEMENT ([place]) LEFT TOPICAL;  Surgeon: Leandrew Koyanagi, MD;  Location: [place];  Service: Ophthalmology;  Laterality: Left;  . CATARACT EXTRACTION W/PHACO Right 01/24/2019   Procedure: CATARACT EXTRACTION PHACO AND INTRAOCULAR LENS PLACEMENT ([place]) RIGHT;  Surgeon: Leandrew Koyanagi, MD;  Location: [place];  Service: Ophthalmology;  Laterality: Right;  . CERVICAL DISC SURGERY  09  . COLONOSCOPY WITH PROPOFOL N/A 01/13/2016   Procedure: COLONOSCOPY WITH PROPOFOL;  Surgeon: [name], MD;  Location: [facility] ENDOSCOPY;  Service: Endoscopy;  Laterality: N/A;  . COLONOSCOPY WITH PROPOFOL N/A 02/19/2020   Procedure: COLONOSCOPY WITH PROPOFOL;  Surgeon: [name], MD;  Location: [facility] ENDOSCOPY;  Service: Gastroenterology;  Laterality: N/A;  . FACELIFT  94  . LOOP RECORDER INSERTION N/A 08/23/2017   Procedure: LOOP RECORDER INSERTION;  Surgeon: Thompson Grayer, MD;  Location: [place] [facility];  Service: Cardiovascular;  Laterality: N/A;  . TEE WITHOUT CARDIOVERSION N/A 08/12/2017   Procedure: TRANSESOPHAGEAL ECHOCARDIOGRAM (TEE);  Surgeon: Sueanne Margarita, MD;  Location: [facility] ENDOSCOPY;  Service: Cardiovascular;  Laterality: N/A;  . TONSILLECTOMY    . TOTAL HIP ARTHROPLASTY Right 04/22/2016   Procedure: TOTAL HIP ARTHROPLASTY;  Surgeon: Corky Mull, MD;  Location: [facility] ORS;  Service: Orthopedics;  Laterality: Right;    Prior to Admission medications   Medication Sig Start Date End Date Taking? Authorizing Provider  albuterol (VENTOLIN HFA) 108 (90 Base) MCG/ACT inhaler TAKE 2 PUFFS BY MOUTH EVERY 6 HOURS AS NEEDED FOR WHEEZE OR SHORTNESS OF BREATH 09/09/20   Crecencio Mc, MD  atorvastatin (LIPITOR) 80 MG tablet TAKE 1 TABLET BY MOUTH DAILY AT 6 PM. NEEDS OFFICE VISIT FOR FURTHER REFILLS. 2ND ATTEMPT 05/27/20   Deboraha Sprang, MD  buPROPion (WELLBUTRIN XL) 300 MG 24 hr tablet Take 300 mg by  mouth daily.  07/15/17   [provider]  clopidogrel (PLAVIX) 75 MG tablet TAKE 1 TABLET BY MOUTH EVERY DAY 06/16/20   Deboraha Sprang, MD  DULoxetine (CYMBALTA) 30 MG capsule Take 30 mg by mouth daily. 08/31/20   [provider]  famotidine (PEPCID) 20 MG tablet Take 20 mg by mouth at bedtime.  11/07/18   [provider]  fluticasone (FLONASE) 50 MCG/ACT nasal spray SPRAY 2 SPRAYS INTO EACH NOSTRIL EVERY DAY 09/09/20   Burnard Hawthorne, FNP  furosemide (LASIX) 20 MG tablet Take 1 tablet (20 mg) by mouth once daily x 3 days, then take 1 tablet (20 mg) once daily as needed for swelling 06/10/20   Deboraha Sprang, MD  mupirocin ointment (BACTROBAN) 2 % APPLY A SMALL AMOUNT TO AFFECTED AREA ONCE A DAY TO EXCISION SITE 02/05/20   [provider]  pregabalin (LYRICA) 50 MG capsule 50 mg during day, 100 mg at night 09/15/20   Gillis Santa, MD    Allergies Aleve [naproxen sodium], Aspirin, Celebrex [celecoxib], Effexor [venlafaxine], Gabapentin, Hydrocodone-homatropine, Ibuprofen, Mobic [meloxicam], Tape, Vioxx [rofecoxib], Other, and Tramadol  Family History  Problem Relation Age  of Onset  . Lung cancer Father   . Aneurysm Brother   . Stroke Brother   . Diabetes Maternal Grandmother   . Kidney disease Maternal Grandfather   . Cushing syndrome Paternal Grandmother   . Dementia Paternal Grandfather   . Breast cancer Maternal Aunt 52    Social History Social History   Tobacco Use  . Smoking status: Former Smoker    Packs/day: 0.50    Years: 40.00    Pack years: 20.00    Types: Cigarettes    Quit date: 11/13/1996    Years since quitting: 23.8  . Smokeless tobacco: Never Used  Vaping Use  . Vaping Use: Never used  Substance Use Topics  . Alcohol use: Yes    Comment: occ wine  . Drug use: No    Review of Systems  Constitutional: No fever/chills Eyes: No visual changes. ENT: No sore throat. Cardiovascular: Denies chest pain. Respiratory: Denies  shortness of breath. Gastrointestinal: No abdominal pain.  No nausea, no vomiting.  No diarrhea.  No constipation. Genitourinary: Negative for dysuria. Musculoskeletal: Negative for back pain. Skin: Negative for rash. Neurological: Positive for headaches, negative for focal weakness or numbness.  ____________________________________________   PHYSICAL EXAM:  VITAL SIGNS: ED Triage Vitals [09/17/20 1328]  Enc Vitals Group     BP (!) 106/55     Pulse Rate 79     Resp 18     Temp 97.7 F (36.5 C)     Temp Source Oral     SpO2 95 %     Weight 200 lb (90.7 kg)     Height 5\' 4"  (1.626 m)     Head Circumference      Peak Flow      Pain Score 7     Pain Loc      Pain Edu?      Excl. in [place]?     Constitutional: Alert and oriented. Eyes: Conjunctivae are normal. Head: Small hematoma over left superior orbit with associated periorbital ecchymosis. Nose: No congestion/rhinnorhea. Mouth/Throat: Mucous membranes are moist. Neck: Normal ROM, no midline cervical spine tenderness. Cardiovascular: Normal rate, regular rhythm. Grossly normal heart sounds. Respiratory: Normal respiratory effort.  No retractions. Lungs CTAB. Gastrointestinal: Soft and nontender. No distention. Genitourinary: deferred Musculoskeletal: No lower extremity tenderness nor edema. Neurologic:  Normal speech and language. No gross focal neurologic deficits are appreciated. Skin:  Skin is warm, dry and intact. No rash noted. Psychiatric: Mood and affect are normal. Speech and behavior are normal.  ____________________________________________   LABS (all labs ordered are listed, but only abnormal results are displayed)  Labs Reviewed  BASIC METABOLIC PANEL - Abnormal; Notable for the following components:      Result Value   Glucose, Bld 104 (*)    Creatinine, Ser 1.34 (*)    GFR, Estimated 37 (*)    All other components within normal limits  CBC - Abnormal; Notable for the following components:    Hemoglobin 11.7 (*)    All other components within normal limits  URINALYSIS, COMPLETE (UACMP) WITH MICROSCOPIC - Abnormal; Notable for the following components:   Color, Urine YELLOW (*)    APPearance HAZY (*)    Bacteria, UA RARE (*)    All other components within normal limits  PROTIME-INR  CBG MONITORING, ED   ____________________________________________  EKG  ED ECG REPORT I, Blake Divine, the attending physician, personally viewed and interpreted this ECG.   Date: 09/17/2020  EKG Time: 13:26  Rate: 81  Rhythm: normal sinus rhythm  Axis: LAD  Intervals:none  ST&T Change: None   PROCEDURES  Procedure(s) performed (including Critical Care):  Procedures   ____________________________________________   INITIAL IMPRESSION / ASSESSMENT AND PLAN / ED COURSE       79 year old female with past medical history of hypertension, hyperlipidemia, stroke, CKD, and pulmonary fibrosis on 2 L nasal cannula at night who presents to the ED complaining of fall off the side of her bed sometime last night.  She has a small hematoma over the left lateral superior portion of her orbit but no proptosis or evidence of ocular injury.  CT head and maxillofacial are negative for acute process, cervical spine results are pending.  EKG shows no evidence of arrhythmia or ischemia, patient denies any syncopal symptoms.  Lab work and UA to be performed given patient's reported weakness, thus far labs show mild elevation in creatinine and we will hydrate with IV fluids.  Patient reports feeling better following IV fluid bolus, UA shows no evidence of infection.  CT cervical spine is also negative for acute process.  Patient is now requesting discharge home, which is reasonable given unremarkable work-up.  She was counseled to return to the ED for new worsening symptoms, patient agrees with plan.      ____________________________________________   FINAL CLINICAL IMPRESSION(S) / ED DIAGNOSES   Final diagnoses:  Fall, initial encounter  Contusion of face, initial encounter  Generalized weakness     ED Discharge Orders    None       Note:  This document was prepared using Dragon voice recognition software and may include unintentional dictation errors.   Blake Divine, MD 09/17/20 1700

## 2020-09-17 NOTE — ED Triage Notes (Signed)
See first nurse note. Pt states confusion since falling and hitting head on bedside table. Pt on coumadin.

## 2020-09-22 ENCOUNTER — Ambulatory Visit: Payer: Medicare Other | Admitting: Family

## 2020-09-22 NOTE — Telephone Encounter (Signed)
I called patient she was doing well. She does have a black eye, scraped elbow as well as a bump on her head. She was in good spirits though. She stated that she did not want a motorized wheelchair nor did she need the mobility assessment. She was confused & thought she needed to be evaluated for a handicap placard. I advised patient that she did not & we recently had filled this out for patient. She is rescheduled 11/22 & told if needed to call us back before then.

## 2020-09-22 NOTE — Telephone Encounter (Signed)
Call pt I see she was seen in ED for fall How is she? Would she like to reschedule her appt from today?

## 2020-09-24 ENCOUNTER — Encounter: Payer: Self-pay | Admitting: Student in an Organized Health Care Education/Training Program

## 2020-09-24 ENCOUNTER — Ambulatory Visit (HOSPITAL_BASED_OUTPATIENT_CLINIC_OR_DEPARTMENT_OTHER): Payer: Medicare Other | Admitting: Student in an Organized Health Care Education/Training Program

## 2020-09-24 ENCOUNTER — Ambulatory Visit
Admission: RE | Admit: 2020-09-24 | Discharge: 2020-09-24 | Disposition: A | Discharge status: Home / Self Care | Payer: Medicare Other | Source: Ambulatory Visit | Attending: Student in an Organized Health Care Education/Training Program | Admitting: Student in an Organized Health Care Education/Training Program

## 2020-09-24 ENCOUNTER — Other Ambulatory Visit: Payer: Self-pay | Admitting: Student in an Organized Health Care Education/Training Program

## 2020-09-24 ENCOUNTER — Other Ambulatory Visit: Payer: Self-pay

## 2020-09-24 VITALS — BP 132/80 | HR 86 | Temp 97.2°F | Resp 16 | Ht 64.0 in | Wt 200.0 lb

## 2020-09-24 DIAGNOSIS — G8929 Other chronic pain: Secondary | ICD-10-CM | POA: Insufficient documentation

## 2020-09-24 DIAGNOSIS — R413 Other amnesia: Secondary | ICD-10-CM | POA: Insufficient documentation

## 2020-09-24 DIAGNOSIS — M533 Sacrococcygeal disorders, not elsewhere classified: Secondary | ICD-10-CM

## 2020-09-24 DIAGNOSIS — M47818 Spondylosis without myelopathy or radiculopathy, sacral and sacrococcygeal region: Secondary | ICD-10-CM | POA: Insufficient documentation

## 2020-09-24 DIAGNOSIS — R52 Pain, unspecified: Secondary | ICD-10-CM

## 2020-09-24 DIAGNOSIS — G894 Chronic pain syndrome: Secondary | ICD-10-CM | POA: Diagnosis not present

## 2020-09-24 DIAGNOSIS — R4189 Other symptoms and signs involving cognitive functions and awareness: Secondary | ICD-10-CM | POA: Insufficient documentation

## 2020-09-24 MED ORDER — METHYLPREDNISOLONE ACETATE 40 MG/ML IJ SUSP
40.0000 mg | Freq: Once | INTRAMUSCULAR | Status: AC
  Administered 2020-09-24: 40 mg via INTRA_ARTICULAR

## 2020-09-24 MED ORDER — METHYLPREDNISOLONE ACETATE 40 MG/ML IJ SUSP
INTRAMUSCULAR | Status: AC
  Filled 2020-09-24: qty 1

## 2020-09-24 MED ORDER — LIDOCAINE HCL 2 % IJ SOLN
20.0000 mL | Freq: Once | INTRAMUSCULAR | Status: AC
  Administered 2020-09-24: 400 mg

## 2020-09-24 MED ORDER — ROPIVACAINE HCL 2 MG/ML IJ SOLN
4.0000 mL | Freq: Once | INTRAMUSCULAR | Status: AC
  Administered 2020-09-24: 4 mL via INTRA_ARTICULAR

## 2020-09-24 MED ORDER — IOHEXOL 180 MG/ML  SOLN: 10.0000 mL | Freq: Once | INTRAMUSCULAR | Status: DC

## 2020-09-24 MED ORDER — LIDOCAINE HCL 2 % IJ SOLN
INTRAMUSCULAR | Status: AC
  Filled 2020-09-24: qty 20

## 2020-09-24 MED ORDER — ROPIVACAINE HCL 2 MG/ML IJ SOLN
INTRAMUSCULAR | Status: AC
  Filled 2020-09-24: qty 10

## 2020-09-24 NOTE — Progress Notes (Signed)
Safety precautions to be maintained throughout the outpatient stay will include: orient to surroundings, keep bed in low position, maintain call bell within reach at all times, provide assistance with transfer out of bed and ambulation.  

## 2020-09-24 NOTE — Progress Notes (Signed)
PROVIDER NOTE: Information contained herein reflects review and annotations entered in association with encounter. Interpretation of such information and data should be left to medically-trained personnel. Information provided to patient can be located elsewhere in the medical record under "Patient Instructions". Document created using STT-dictation technology, any transcriptional errors that may result from process are unintentional.    Patient: Melody Ochoa  Service Category: Procedure  Provider: Gillis Santa, MD  DOB: 1941-01-27  DOS: 09/24/2020  Location: [facility]  MRN: [id number]  Setting: Ambulatory - outpatient  Referring Provider: Burnard Hawthorne, FNP  Type: Established Patient  Specialty: Interventional Pain Management  PCP: Burnard Hawthorne, FNP   Primary Reason for Visit: Interventional Pain Management Treatment. CC: Back Pain (lumbar left is worse.) and Shoulder Pain (left )  Procedure:          Anesthesia, Analgesia, Anxiolysis:  Type: Diagnostic Sacroiliac Joint Steroid Injection #1  Region: Inferior Lumbosacral Region Level: PIIS (Posterior Inferior Iliac Spine) Laterality: Left-Side  Type: Local Anesthesia  Local Anesthetic: Lidocaine 1-2%  Position: Prone           Indications: 1. Arthritis of left sacroiliac joint   2. Chronic left SI joint pain   3. SI (sacroiliac) joint dysfunction   4. Memory deficit   5. Chronic pain syndrome    Pain Score: Pre-procedure: 6 /10 Post-procedure: 3 /10   Pre-op Assessment:  Melody Ochoa is a 79 y.o. (year old), female patient, seen today for interventional treatment. She  has a past surgical history that includes Tonsillectomy; Facelift (94); Cervical disc surgery (09); Back surgery (13); Colonoscopy with propofol (N/A, 01/13/2016); Total hip arthroplasty (Right, 04/22/2016); TEE without cardioversion (N/A, 08/12/2017); LOOP RECORDER INSERTION (N/A, 08/23/2017); Breast excisional biopsy (Left, 90's); Breast  biopsy (Bilateral); Cataract extraction w/PHACO (Left, 12/20/2018); Cataract extraction w/PHACO (Right, 01/24/2019); and Colonoscopy with propofol (N/A, 02/19/2020). Melody Ochoa has a current medication list which includes the following prescription(s): albuterol, atorvastatin, bupropion, clopidogrel, duloxetine, famotidine, fluticasone, mupirocin ointment, pregabalin, and furosemide, and the following Facility-Administered Medications: iohexol. Her primarily concern today is the Back Pain (lumbar left is worse.) and Shoulder Pain (left )  Initial Vital Signs:  Pulse/HCG Rate: 82  Temp: (!) 97.2 F (36.2 C) Resp: 16 BP: 118/68 SpO2: 99 %  BMI: Estimated body mass index is 34.33 kg/m as calculated from the following:   Height as of this encounter: 5\' 4"  (1.626 m).   Weight as of this encounter: 200 lb (90.7 kg).  Risk Assessment: Allergies: Reviewed. She is allergic to latex, aleve [naproxen sodium], aspirin, celebrex [celecoxib], effexor [venlafaxine], gabapentin, hydrocodone-homatropine, ibuprofen, mobic [meloxicam], tape, vioxx [rofecoxib], other, and tramadol.  Allergy Precautions: None required Coagulopathies: Reviewed. None identified.  Blood-thinner therapy: None at this time Active Infection(s): Reviewed. None identified. Melody Ochoa is afebrile  Site Confirmation: Melody Ochoa was asked to confirm the procedure and laterality before marking the site Procedure checklist: Completed Consent: Before the procedure and under the influence of no sedative(s), amnesic(s), or anxiolytics, the patient was informed of the treatment options, risks and possible complications. To fulfill our ethical and legal obligations, as recommended by the American Medical Association's Code of Ethics, I have informed the patient of my clinical impression; the nature and purpose of the treatment or procedure; the risks, benefits, and possible complications of the intervention; the alternatives, including doing  nothing; the risk(s) and benefit(s) of the alternative treatment(s) or procedure(s); and the risk(s) and benefit(s) of doing nothing. The patient was provided information about  the general risks and possible complications associated with the procedure. These may include, but are not limited to: failure to achieve desired goals, infection, bleeding, organ or nerve damage, allergic reactions, paralysis, and death. In addition, the patient was informed of those risks and complications associated to the procedure, such as failure to decrease pain; infection; bleeding; organ or nerve damage with subsequent damage to sensory, motor, and/or autonomic systems, resulting in permanent pain, numbness, and/or weakness of one or several areas of the body; allergic reactions; (i.e.: anaphylactic reaction); and/or death. Furthermore, the patient was informed of those risks and complications associated with the medications. These include, but are not limited to: allergic reactions (i.e.: anaphylactic or anaphylactoid reaction(s)); adrenal axis suppression; blood sugar elevation that in diabetics may result in ketoacidosis or comma; water retention that in patients with history of congestive heart failure may result in shortness of breath, pulmonary edema, and decompensation with resultant heart failure; weight gain; swelling or edema; medication-induced neural toxicity; particulate matter embolism and blood vessel occlusion with resultant organ, and/or nervous system infarction; and/or aseptic necrosis of one or more joints. Finally, the patient was informed that Medicine is not an exact science; therefore, there is also the possibility of unforeseen or unpredictable risks and/or possible complications that may result in a catastrophic outcome. The patient indicated having understood very clearly. We have given the patient no guarantees and we have made no promises. Enough time was given to the patient to ask questions, all of  which were answered to the patient's satisfaction. Melody Ochoa has indicated that she wanted to continue with the procedure. Attestation: I, the ordering provider, attest that I have discussed with the patient the benefits, risks, side-effects, alternatives, likelihood of achieving goals, and potential problems during recovery for the procedure that I have provided informed consent. Date  Time: 09/24/2020  1:46 PM  Pre-Procedure Preparation:  Monitoring: As per clinic protocol. Respiration, ETCO2, SpO2, BP, heart rate and rhythm monitor placed and checked for adequate function Safety Precautions: Patient was assessed for positional comfort and pressure points before starting the procedure. Time-out: I initiated and conducted the "Time-out" before starting the procedure, as per protocol. The patient was asked to participate by confirming the accuracy of the "Time Out" information. Verification of the correct person, site, and procedure were performed and confirmed by me, the nursing staff, and the patient. "Time-out" conducted as per Joint Commission's Universal Protocol (UP.01.01.01). Time: 1416  Description of Procedure:          Target Area: Inferior, posterior, aspect of the sacroiliac fissure Approach: Posterior, paraspinal, ipsilateral approach. Area Prepped: Entire Lower Lumbosacral Region DuraPrep (Iodine Povacrylex [0.7% available iodine] and Isopropyl Alcohol, 74% w/w) Safety Precautions: Aspiration looking for blood return was conducted prior to all injections. At no point did we inject any substances, as a needle was being advanced. No attempts were made at seeking any paresthesias. Safe injection practices and needle disposal techniques used. Medications properly checked for expiration dates. SDV (single dose vial) medications used. Description of the Procedure: Protocol guidelines were followed. The patient was placed in position over the procedure table. The target area was identified  and the area prepped in the usual manner. Skin & deeper tissues infiltrated with local anesthetic. Appropriate amount of time allowed to pass for local anesthetics to take effect. The procedure needle was advanced under fluoroscopic guidance into the sacroiliac joint until a firm endpoint was obtained. Proper needle placement secured. Negative aspiration confirmed. Solution injected in intermittent fashion, asking  for systemic symptoms every 0.5cc of injectate. The needles were then removed and the area cleansed, making sure to leave some of the prepping solution back to take advantage of its long term bactericidal properties. Vitals:   09/24/20 1354 09/24/20 1410 09/24/20 1420  BP: 118/68 133/81 132/80  Pulse: 82 88 86  Resp: 16 16 16   Temp: (!) 97.2 F (36.2 C)    TempSrc: Temporal    SpO2: 99% 96% 97%  Weight: 200 lb (90.7 kg)    Height: 5\' 4"  (1.626 m)      Start Time: 1416 hrs. End Time: 1418 hrs. Materials:  Needle(s) Type: Spinal Needle Gauge: 22G Length: 3.5-in Medication(s): Please see orders for medications and dosing details. 5 cc solution made of 4 cc of 0.2% ropivacaine, 1 cc of methylprednisolone, 40 mg/cc.  2.5 cc injected into the left intra-articular joint, 2.5 cc injected periarticular Imaging Guidance (Non-Spinal):          Type of Imaging Technique: Fluoroscopy Guidance (Non-Spinal) Indication(s): Assistance in needle guidance and placement for procedures requiring needle placement in or near specific anatomical locations not easily accessible without such assistance. Exposure Time: Please see nurses notes. Contrast: None used. Fluoroscopic Guidance: I was personally present during the use of fluoroscopy. "Tunnel Vision Technique" used to obtain the best possible view of the target area. Parallax error corrected before commencing the procedure. "Direction-depth-direction" technique used to introduce the needle under continuous pulsed fluoroscopy. Once target was reached,  antero-posterior, oblique, and lateral fluoroscopic projection used confirm needle placement in all planes. Images permanently stored in EMR. Interpretation: No contrast injected.  Antibiotic Prophylaxis:   Anti-infectives (From admission, onward)   None     Indication(s): None identified  Post-operative Assessment:  Post-procedure Vital Signs:  Pulse/HCG Rate: 86  Temp: (!) 97.2 F (36.2 C) Resp: 16 BP: 132/80 SpO2: 97 %  EBL: None  Complications: No immediate post-treatment complications observed by team, or reported by patient.  Note: The patient tolerated the entire procedure well. A repeat set of vitals were taken after the procedure and the patient was kept under observation following institutional policy, for this type of procedure. Post-procedural neurological assessment was performed, showing return to baseline, prior to discharge. The patient was provided with post-procedure discharge instructions, including a section on how to identify potential problems. Should any problems arise concerning this procedure, the patient was given instructions to immediately contact us, at any time, without hesitation. In any case, we plan to contact the patient by telephone for a follow-up status report regarding this interventional procedure.  Comments:  No additional relevant information.  Plan of Care  Orders:  Orders Placed This Encounter  Procedures  . [facility] C-ARM 1-60 MIN NO REPORT    Intraoperative interpretation by procedural physician at [facility].    Standing Status:   Standing    Number of Occurrences:   1    Order Specific Question:   Reason for exam:    Answer:   Assistance in needle guidance and placement for procedures requiring needle placement in or near specific anatomical locations not easily accessible without such assistance.  . Ambulatory referral to Neurology    Referral Priority:   Routine    Referral Type:   Consultation    Referral  Reason:   Specialty Services Required    Referred to Provider:   Vladimir Crofts, MD    Requested Specialty:   Neurology    Number of Visits Requested:  1    Referral to neurology for work-up of memory deficit and concern for MS  Medications ordered for procedure: Meds ordered this encounter  Medications  . iohexol (OMNIPAQUE) 180 MG/ML injection 10 mL    Must be Myelogram-compatible. If not available, you may substitute with a water-soluble, non-ionic, hypoallergenic, myelogram-compatible radiological contrast medium.  Marland Kitchen lidocaine (XYLOCAINE) 2 % (with pres) injection 400 mg  . ropivacaine (PF) 2 mg/mL (0.2%) (NAROPIN) injection 4 mL  . methylPREDNISolone acetate (DEPO-MEDROL) injection 40 mg   Medications administered: We administered lidocaine, ropivacaine (PF) 2 mg/mL (0.2%), and methylPREDNISolone acetate.  See the medical record for exact dosing, route, and time of administration.  Follow-up plan:   Return in about 4 weeks (around 10/22/2020) for Post Procedure Evaluation, virtual.      Status post left shoulder intra-articular injection, posterior approach on 08/08/2019, 09/17/2019- helped, repeat PRN.   Left sacroiliac joint injection 09/24/2020        Recent Visits Date Type Provider Dept  08/26/20 Office Visit Gillis Santa, MD [facility]  Showing recent visits within past 90 days and meeting all other requirements Today's Visits Date Type Provider Dept  09/24/20 Procedure visit Gillis Santa, MD [facility]  Showing today's visits and meeting all other requirements Future Appointments Date Type Provider Dept  10/21/20 Appointment Gillis Santa, MD [facility]  Showing future appointments within next 90 days and meeting all other requirements  Disposition: Discharge home  Discharge (Date  Time): 09/24/2020; 1425 hrs.   Primary Care Physician: Burnard Hawthorne, FNP Location: [facility] Note by: Gillis Santa, MD Date: 09/24/2020; Time: 3:19 PM  Disclaimer:  Medicine is not an exact science. The only guarantee in medicine is that nothing is guaranteed. It is important to note that the decision to proceed with this intervention was based on the information collected from the patient. The Data and conclusions were drawn from the patient's questionnaire, the interview, and the physical examination. Because the information was provided in large part by the patient, it cannot be guaranteed that it has not been purposely or unconsciously manipulated. Every effort has been made to obtain as much relevant data as possible for this evaluation. It is important to note that the conclusions that lead to this procedure are derived in large part from the available data. Always take into account that the treatment will also be dependent on availability of resources and existing treatment guidelines, considered by other Pain Management Practitioners as being common knowledge and practice, at the time of the intervention. For Medico-Legal purposes, it is also important to point out that variation in procedural techniques and pharmacological choices are the acceptable norm. The indications, contraindications, technique, and results of the above procedure should only be interpreted and judged by a Board-Certified Interventional Pain Specialist with extensive familiarity and expertise in the same exact procedure and technique.

## 2020-10-09 LAB — CUP PACEART REMOTE DEVICE CHECK
Date Time Interrogation Session: 20211101233148
Implantable Pulse Generator Implant Date: 20180918

## 2020-10-13 ENCOUNTER — Ambulatory Visit (INDEPENDENT_AMBULATORY_CARE_PROVIDER_SITE_OTHER): Payer: Medicare Other

## 2020-10-13 DIAGNOSIS — I63219 Cerebral infarction due to unspecified occlusion or stenosis of unspecified vertebral arteries: Secondary | ICD-10-CM

## 2020-10-13 DIAGNOSIS — R0602 Shortness of breath: Secondary | ICD-10-CM | POA: Diagnosis not present

## 2020-10-14 ENCOUNTER — Ambulatory Visit (INDEPENDENT_AMBULATORY_CARE_PROVIDER_SITE_OTHER): Payer: Medicare Other

## 2020-10-14 ENCOUNTER — Other Ambulatory Visit: Payer: Self-pay

## 2020-10-14 ENCOUNTER — Encounter: Payer: Self-pay | Admitting: Pulmonary Disease

## 2020-10-14 ENCOUNTER — Ambulatory Visit (INDEPENDENT_AMBULATORY_CARE_PROVIDER_SITE_OTHER): Payer: Medicare Other | Admitting: Pulmonary Disease

## 2020-10-14 VITALS — BP 136/90 | HR 76 | Ht 65.0 in | Wt 200.0 lb

## 2020-10-14 DIAGNOSIS — J9611 Chronic respiratory failure with hypoxia: Secondary | ICD-10-CM

## 2020-10-14 DIAGNOSIS — J8410 Pulmonary fibrosis, unspecified: Secondary | ICD-10-CM

## 2020-10-14 DIAGNOSIS — R058 Other specified cough: Secondary | ICD-10-CM

## 2020-10-14 DIAGNOSIS — J9801 Acute bronchospasm: Secondary | ICD-10-CM

## 2020-10-14 DIAGNOSIS — L570 Actinic keratosis: Secondary | ICD-10-CM

## 2020-10-14 MED ORDER — BREZTRI AEROSPHERE 160-9-4.8 MCG/ACT IN AERO: 2.0000 | INHALATION_SPRAY | Freq: Two times a day (BID) | RESPIRATORY_TRACT | 0 refills | Status: AC

## 2020-10-14 MED ORDER — AMINOLEVULINIC ACID HCL 20 % EX SOLR
1.0000 "application " | Freq: Once | CUTANEOUS | Status: AC
  Administered 2020-10-14: 354 mg via TOPICAL

## 2020-10-14 MED ORDER — ALBUTEROL SULFATE HFA 108 (90 BASE) MCG/ACT IN AERS: INHALATION_SPRAY | RESPIRATORY_TRACT | 2 refills | Status: DC

## 2020-10-14 NOTE — Progress Notes (Signed)
Carelink Summary Report / Loop Recorder 

## 2020-10-14 NOTE — Progress Notes (Signed)
Subjective:    Patient ID: Melody Ochoa, female    DOB: July 26, 1941, 79 y.o.   MRN: [id number]  HPI This is an 79 year old remote former smoker usually followed by Dr. Ashby Dawes. She was noted to have pulmonary fibrosis. Underwent pulmonary function testing and high-resolution CT scan of the chest these have confirmed restrictive physiology related to pulmonary fibrosis. Chest did not show progression from CT performed June 2020 however some progression from 2018. Findings consistent with UIP therefore diagnosis of idiopathic pulmonary fibrosis. The patient was supposed to see Dr. Chase Caller for evaluation of her interstitial lung disease however she prefers to continue her care here in [place]. She presents today with increasing shortness of breath. She has been on nocturnal oxygen but has been reluctant to have portable oxygen previously. Cough productive of whitish to yellowish sputum.  Patient also notices wheezing. No fevers, chills or sweats. No hemoptysis. Has been compliant with nocturnal oxygen.  Review of Systems A 10 point review of systems was performed and it is as noted above otherwise negative.   Patient Active Problem List   Diagnosis Date Noted  . Memory deficit 09/24/2020  . Pulmonary fibrosis ([place]) 09/14/2020  . Chronic cough 09/14/2020  . Arthritis of left sacroiliac joint 08/26/2020  . Chronic left SI joint pain 08/26/2020  . CKD (chronic kidney disease) 07/23/2020  . HTN (hypertension) 04/23/2020  . Positive fecal immunochemical test   . Angiodysplasia of intestinal tract   . Transient ischemic attack (TIA) 10/08/2019  . Transient right leg weakness 09/30/2019  . Peripheral neuropathy 08/10/2019  . Primary osteoarthritis of left shoulder 07/24/2019  . Elevated blood pressure reading 06/27/2019  . Left shoulder pain 05/04/2019  . Anemia 03/19/2019  . Long term current use of opiate analgesic 01/09/2019  . Foraminal stenosis of lumbar region 11/14/2018   . Chronic pain syndrome 09/12/2018  . SOB (shortness of breath) on exertion 08/11/2018  . Dysphagia 08/11/2018  . Cough 06/30/2018  . Rash 01/16/2018  . Hyperlipidemia 10/18/2017  . History of lumbar surgery 10/04/2017  . Depression, recurrent ([place]) 09/27/2017  . Pulmonary nodules 09/27/2017  . History of stroke 09/12/2017  . Stroke (cerebrum) ([place]) 08/10/2017  . Primary osteoarthritis of left hip 02/28/2017  . Strain of left hip 02/28/2017  . B12 deficiency 12/21/2016  . Generalized OA 05/13/2016  . Status post total replacement of right hip 04/22/2016  . Personal history of colonic polyps   . Greater trochanteric bursitis of both hips 01/09/2016  . Prediabetes 08/04/2015  . Thoracic aortic atherosclerosis ([place]) 06/16/2015  . 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  . UTI (lower urinary tract infection) 11/22/2014  . Low back pain 11/22/2014  . Spondylolisthesis of lumbar region 11/18/2014  . Lumbar radiculopathy 08/21/2014  . Degeneration of lumbar or lumbosacral intervertebral disc 08/21/2014  . Left hip pain 08/21/2014  . Osteopenia 06/23/2014  . Major depression in remission ([place]) 06/23/2014  . Anxiety 06/23/2014  . Menopause 06/23/2014    Allergies  Allergen Reactions  . Latex Itching  . Aleve [Naproxen Sodium] Other (See Comments)    Gi upset  . Aspirin Other (See Comments)    Stomach pain-aggravates diverticulosis  . Celebrex [Celecoxib] Other (See Comments)    Dizziness   . Effexor [Venlafaxine] Other (See Comments)    Hot flashes   . Gabapentin Other (See Comments)    "Night terrors"  . Hydrocodone-Homatropine Diarrhea  . Ibuprofen Other (See Comments)    Gi upset   . Mobic [Meloxicam]  Stomach upset  . Tape Itching and Other (See Comments)    Itchy blisters  . Vioxx [Rofecoxib] Other (See Comments)    Gi upset  . Other Rash and Other (See Comments)    bandaides  . Tramadol Other (See Comments)    hallucinations   Current Meds   Medication Sig  . atorvastatin (LIPITOR) 80 MG tablet TAKE 1 TABLET BY MOUTH DAILY AT 6 PM. NEEDS OFFICE VISIT FOR FURTHER REFILLS. 2ND ATTEMPT  . buPROPion (WELLBUTRIN XL) 300 MG 24 hr tablet Take 300 mg by mouth daily.   . clopidogrel (PLAVIX) 75 MG tablet TAKE 1 TABLET BY MOUTH EVERY DAY  . DULoxetine (CYMBALTA) 30 MG capsule Take 30 mg by mouth daily.  . famotidine (PEPCID) 20 MG tablet Take 20 mg by mouth at bedtime.   . pregabalin (LYRICA) 50 MG capsule 50 mg during day, 100 mg at night   Immunization History  Administered Date(s) Administered  . Influenza, High Dose Seasonal PF 01/16/2018, 10/05/2018  . Influenza-Unspecified 09/28/2013, 10/09/2014, 09/08/2016, 09/27/2016, 09/12/2017, 01/16/2018  . PFIZER SARS-COV-2 Vaccination 01/31/2020, 02/21/2020  . Pneumococcal Conjugate-13 10/09/2014  . Pneumococcal Polysaccharide-23 02/14/2018, 10/01/2019  . Td 07/10/2014  . Tdap 07/24/2017       Objective:   Physical Exam BP 136/90 (BP Location: Left Arm, Patient Position: Sitting, Cuff Size: Normal)   Pulse 76   Ht 5\' 5"  (1.651 m)   Wt 200 lb (90.7 kg)   SpO2 95%   BMI 33.28 kg/m  GENERAL: Well-developed somewhat overweight elderly woman in no acute distress. She presents on transport chair due to disabling dyspnea with ambulation. HEAD: Normocephalic, atraumatic.  EYES: Pupils equal, round, reactive to light.  No scleral icterus.  MOUTH: Nose/mouth/throat not examined due to masking requirements for COVID 19. NECK: Supple. No thyromegaly. Trachea midline. No JVD.  No adenopathy. PULMONARY: Good air entry bilaterally. She has wheezes and crackles noted throughout. These are dry crackles. CARDIOVASCULAR: S1 and S2. Regular rate and rhythm. No rubs, murmurs or gallops heard. ABDOMEN: Benign. MUSCULOSKELETAL: No joint deformity, no clubbing, no edema.  NEUROLOGIC: No focal deficit, gait has to be assisted, fluent speech. SKIN: Intact,warm,dry. PSYCH: Mood and behavior  normal.  Ambulatory oximetry performed today: At baseline patient had O2 sat of 93% on room air, with less than 250 feet of ambulation she desaturated to 86%. Patient required 2 L/min continuously to maintain O2 sats at 92%.  Representative slice from high-resolution CT scan of the chest performed 29 May 2020 previously reviewed:     Assessment & Plan:     ICD-10-CM   1. Pulmonary fibrosis (HCC)  J84.10 albuterol (VENTOLIN HFA) 108 (90 Base) MCG/ACT inhaler    AMB REFERRAL FOR DME    CANCELED: AMB REFERRAL FOR DME   Patient has constellation of findings consistent with UIP Cannot exclude underlying COPD given she was smoker in the past Dyspnea likely related to this  2. Chronic respiratory failure with hypoxia (HCC)  J96.11    Qualifies for supplemental oxygen at 2 L/min Already on nocturnal oxygen  3. Bronchospasm  J98.01 albuterol (VENTOLIN HFA) 108 (90 Base) MCG/ACT inhaler   Trial of Breztri 2 puffs twice a day Albuterol as needed Cannot exclude underlying COPD   Orders Placed This Encounter  Procedures  . AMB REFERRAL FOR DME    Referral Priority:   Routine    Referral Type:   Durable Medical Equipment Purchase    Number of Visits Requested:   1   Meds  ordered this encounter  Medications  . albuterol (VENTOLIN HFA) 108 (90 Base) MCG/ACT inhaler    Sig: TAKE 2 PUFFS BY MOUTH EVERY 6 HOURS AS NEEDED FOR WHEEZE OR SHORTNESS OF BREATH    Dispense:  8.5 each    Refill:  2  . Budeson-Glycopyrrol-Formoterol (BREZTRI AEROSPHERE) 160-9-4.8 MCG/ACT AERO    Sig: Inhale 2 puffs into the lungs in the morning and at bedtime for 1 day.    Dispense:  10.7 g    Refill:  0    Order Specific Question:   Lot Number?    Answer:   [id number]    Order Specific Question:   Expiration Date?    Answer:   01/06/2022    Order Specific Question:   Manufacturer?    Answer:   [employer] [[id number]]    Order Specific Question:   Quantity    Answer:   2   The patient in follow-up in 6 to 8 weeks time  she is to call sooner should any new difficulties arise.   Renold Don, MD [facility]   *This note was dictated using voice recognition software/Dragon.  Despite best efforts to proofread, errors can occur which can change the meaning.  Any change was purely unintentional.

## 2020-10-14 NOTE — Patient Instructions (Signed)

## 2020-10-14 NOTE — Progress Notes (Signed)
Patient completed PDT therapy today.  1. AK (actinic keratosis) Head - Anterior (Face)  Photodynamic therapy - Head - Anterior (Face) Procedure discussed: discussed risks, benefits, side effects. and alternatives   Prep: site scrubbed/prepped with acetone   Location:  Face Number of lesions:  Multiple Type of treatment:  Blue light Aminolevulinic Acid (see MAR for details): Levulan Number of Levulan sticks used:  1 Incubation time (minutes):  60 Number of minutes under lamp:  16 Number of seconds under lamp:  40 Cooling:  Floor fan Post-procedure details: sunscreen applied    Aminolevulinic Acid HCl 20 % SOLR 354 mg - Head - Anterior (Face)

## 2020-10-14 NOTE — Patient Instructions (Signed)
We are going to order oxygen through [place].  You need oxygen so that your heart does not get stressed.  We are giving you a trial of Breztri 2 puffs twice a day to see how you do with this.  Please let us know how this does for you.  Also please rinse your mouth well after you use it.  We will see you back in follow-up in 6 to 8 weeks time call sooner should any new problems arise.

## 2020-10-20 ENCOUNTER — Encounter: Payer: Self-pay | Admitting: Student in an Organized Health Care Education/Training Program

## 2020-10-20 ENCOUNTER — Encounter: Payer: Self-pay | Admitting: Pulmonary Disease

## 2020-10-21 ENCOUNTER — Encounter: Payer: Self-pay | Admitting: Student in an Organized Health Care Education/Training Program

## 2020-10-21 ENCOUNTER — Other Ambulatory Visit: Payer: Self-pay

## 2020-10-21 ENCOUNTER — Ambulatory Visit
Payer: Medicare Other | Attending: Student in an Organized Health Care Education/Training Program | Admitting: Student in an Organized Health Care Education/Training Program

## 2020-10-21 DIAGNOSIS — G894 Chronic pain syndrome: Secondary | ICD-10-CM

## 2020-10-21 DIAGNOSIS — M533 Sacrococcygeal disorders, not elsewhere classified: Secondary | ICD-10-CM | POA: Diagnosis not present

## 2020-10-21 DIAGNOSIS — M47818 Spondylosis without myelopathy or radiculopathy, sacral and sacrococcygeal region: Secondary | ICD-10-CM

## 2020-10-21 DIAGNOSIS — J8410 Pulmonary fibrosis, unspecified: Secondary | ICD-10-CM | POA: Diagnosis not present

## 2020-10-21 DIAGNOSIS — G8929 Other chronic pain: Secondary | ICD-10-CM

## 2020-10-21 NOTE — Progress Notes (Signed)
Patient: Melody Ochoa  Service Category: E/M  Provider: Gillis Santa, MD  DOB: 1941/09/07  DOS: 10/21/2020  Location: Office  MRN: [id number]  Setting: Ambulatory outpatient  Referring Provider: Burnard Hawthorne, FNP  Type: Established Patient  Specialty: Interventional Pain Management  PCP: Melody Hawthorne, FNP  Location: Home  Delivery: TeleHealth     Virtual Encounter - Pain Management PROVIDER NOTE: Information contained herein reflects review and annotations entered in association with encounter. Interpretation of such information and data should be left to medically-trained personnel. Information provided to patient can be located elsewhere in the medical record under "Patient Instructions". Document created using STT-dictation technology, any transcriptional errors that may result from process are unintentional.    Contact & Pharmacy Preferred: [phone] Home: [phone] (home) Mobile: [phone] (mobile) E-mail: [email or web address]  [id number] [address]Phone: [phone]: [phone]  Pre-screening  Melody Ochoa offered "in-person" vs "virtual" encounter. She indicated preferring virtual for this encounter.   Reason COVID-19*  Social distancing based on CDC and AMA recommendations.   I contacted Melody GRACEYon 10/21/2020 via telephone.      I clearly identified myself as BGillis Santa MD. I verified that I was speaking with the correct person using two identifiers (Name: Melody Ochoa and date of birth: [birth year].  Consent I sought verbal advanced consent from Melody Cowboyfor virtual visit interactions. I informed Ms. MGooteeof possible security and privacy concerns, risks, and limitations associated with providing "not-in-person" medical evaluation and management services. I also informed Melody Ochoa of the availability of "in-person" appointments. Finally, I  informed her that there would be a charge for the virtual visit and that she could be  personally, fully or partially, financially responsible for it. Melody Ochoa understanding and agreed to proceed.   Historic Elements   Ms. DNOORAH GIAMMONAis a 79y.o. year old, female patient evaluated today after our last contact on 09/24/2020. Ms. MRicklefs has a past medical history of Arthritis, Atheromatous plaque (10/18/2017), Back pain, [place], Depression, Diverticulosis, Dizziness, GERD (gastroesophageal reflux disease), Heart murmur, Hyperlipidemia (10/18/2017), PONV (postoperative nausea and vomiting), Squamous cell carcinoma of skin (11/16/2016), Squamous cell carcinoma of skin (02/05/2020), and Stroke ([facility]. She also  has a past surgical history that includes Tonsillectomy; Facelift (94); Cervical disc surgery (09); Back surgery (13); Colonoscopy with propofol (N/A, 01/13/2016); Total hip arthroplasty (Right, 04/22/2016); TEE without cardioversion (N/A, 08/12/2017); LOOP RECORDER INSERTION (N/A, 08/23/2017); Breast excisional biopsy (Left, 90's); Breast biopsy (Bilateral); Cataract extraction w/PHACO (Left, 12/20/2018); Cataract extraction w/PHACO (Right, 01/24/2019); and Colonoscopy with propofol (N/A, 02/19/2020). Ms. MAverharthas a current medication list which includes the following prescription(s): albuterol, atorvastatin, bupropion, clopidogrel, duloxetine, famotidine, pregabalin, fluticasone, furosemide, and mupirocin ointment. She  reports that she quit smoking about 23 years ago. Her smoking use included cigarettes. She has a 30.00 pack-year smoking history. She has never used smokeless tobacco. She reports current alcohol use. She reports that she does not use drugs. Ms. MErohis allergic to latex, aleve [naproxen sodium], aspirin, celebrex [celecoxib], effexor [venlafaxine], gabapentin, hydrocodone-homatropine, ibuprofen, mobic [meloxicam], tape, vioxx [rofecoxib], other, and tramadol.    HPI  Today, she is being contacted for a post-procedure assessment.    Post-Procedure Evaluation  Procedure (09/24/2020):   Type: Diagnostic Sacroiliac Joint Steroid Injection #1  Region: Inferior Lumbosacral Region Level: PIIS (Posterior Inferior Iliac Spine) Laterality: Left-Side  Sedation: Please see nurses note.  Effectiveness during initial hour after procedure(Ultra-Short Term Relief):  (can't remember)   Local anesthetic used: Long-acting (4-6 hours) Effectiveness: Defined as any analgesic benefit obtained secondary to the administration of local anesthetics. This carries significant diagnostic value as to the etiological location, or anatomical origin, of the pain. Duration of benefit is expected to coincide with the duration of the local anesthetic used.  Effectiveness during initial 4-6 hours after procedure(Short-Term Relief):  (can't remember)   Long-term benefit: Defined as any relief past the pharmacologic duration of the local anesthetics.  Effectiveness past the initial 6 hours after procedure(Long-Term Relief): 50 %   Current benefits: Defined as benefit that persist at this time.   Analgesia:  50% improved Function: Melody Ochoa reports improvement in function ROM: Melody Ochoa reports improvement in ROM    Laboratory Chemistry Profile   Renal Lab Results  Component Value Date   BUN 19 09/17/2020   CREATININE 1.34 (H) 09/17/2020   GFR 43.64 (L) 05/21/2020   GFRAA 59 (L) 03/11/2020   GFRNONAA 37 (L) 09/17/2020     Hepatic Lab Results  Component Value Date   AST 17 04/22/2020   ALT 13 04/22/2020   ALBUMIN 4.0 04/22/2020   ALKPHOS 62 04/22/2020     Electrolytes Lab Results  Component Value Date   NA 140 09/17/2020   K 3.9 09/17/2020   CL 104 09/17/2020   CALCIUM 9.1 09/17/2020     Bone No results found for: VD25OH, VD125OH2TOT, FA2130QM5, HQ4696EX5, 25OHVITD1, 25OHVITD2, 25OHVITD3, TESTOFREE, TESTOSTERONE   Inflammation (CRP: Acute Phase)  (ESR: Chronic Phase) No results found for: CRP, ESRSEDRATE, LATICACIDVEN     Note: Above Lab results reviewed.  Assessment  The primary encounter diagnosis was Arthritis of left sacroiliac joint. Diagnoses of Chronic left SI joint pain, SI (sacroiliac) joint dysfunction, and Chronic pain syndrome were also pertinent to this visit.  Plan of Care   Ms. IVORI STORR has a current medication list which includes the following long-term medication(s): albuterol, atorvastatin, bupropion, clopidogrel, duloxetine, famotidine, pregabalin, fluticasone, and furosemide.  Moderate pain relief after left SI joint injection.  Patient states overall she is doing better after her SI joint block.  Repeat PRN Orders:  Orders Placed This Encounter  Procedures  . SACROILIAC JOINT INJECTION    Scheduling timeframe: (PRN procedure) Ms. Mcinroy will call when needed. Clinical indication: Low back pain, w/ or w/o groin pain. Sacroiliac joint pain Sedation: Usually done with sedation. (May be done without sedation if so desired by patient.) Requirements: NPO x 8 hrs.; Driver; Stop blood thinners.    Standing Status:   Standing    Number of Occurrences:   1    Standing Expiration Date:   10/21/2021    Scheduling Instructions:     Side: LEFT     Sedation: Patient's choice.    Order Specific Question:   Where will this procedure be performed?    Answer:   [facility]   Follow-up plan:   Return if symptoms worsen or fail to improve.     Status post left shoulder intra-articular injection, posterior approach on 08/08/2019, 09/17/2019- helped, repeat PRN.   Left sacroiliac joint injection 09/24/2020         Recent Visits Date Type Provider Dept  09/24/20 Procedure visit Melody Santa, MD [facility]  08/26/20 Office Visit Melody Santa, MD [facility]  Showing recent visits within past 90 days and meeting all other requirements Today's Visits Date Type Provider Dept  10/21/20  Telemedicine Melody Santa, MD [facility]  Showing today's visits and meeting all other requirements Future Appointments No visits were found meeting these conditions. Showing future appointments within next 90 days and meeting all other requirements  I discussed the assessment and treatment plan with the patient. The patient was provided an opportunity to ask questions and all were answered. The patient agreed with the plan and demonstrated an understanding of the instructions.  Patient advised to call back or seek an in-person evaluation if the symptoms or condition worsens.  Duration of encounter: 20 minutes.  Note by: Melody Santa, MD Date: 10/21/2020; Time: 3:15 PM

## 2020-10-27 ENCOUNTER — Ambulatory Visit: Payer: Medicare Other | Admitting: Family

## 2020-10-27 DIAGNOSIS — Z0289 Encounter for other administrative examinations: Secondary | ICD-10-CM

## 2020-11-12 ENCOUNTER — Telehealth: Payer: Self-pay | Admitting: Pulmonary Disease

## 2020-11-12 DIAGNOSIS — J8410 Pulmonary fibrosis, unspecified: Secondary | ICD-10-CM | POA: Diagnosis not present

## 2020-11-12 NOTE — Telephone Encounter (Signed)
Spoke to patient via telephone.  Patient stated that her oxygen level was 68% last night on 2L. HR was in the 60's. Reports of increased sob at that time. Fingers were not cold and she does not have finger nail polish on. She is not only supplement oxygen during the only. Only wears 2L QHS.  Patient checked oxygen levels during our conversation. spo2 is 88% on room air at rest with HR of 92. Patient stated that she is not currently short of breath. Advised patient to apply 2L.   Patient is prescribed 2L cont. Patient stated that she was unaware of this, therefore she has not been wearing oxygen during the day.  Patient stated that she does not have tanks to use during the day. 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  Order was placed to [facility] for continuous oxygen on 10/14/2020.  Rodena Piety, can you help with this? Thanks

## 2020-11-12 NOTE — Telephone Encounter (Signed)
Patient is aware of below message. She voiced understanding and had no further questions.

## 2020-11-12 NOTE — Telephone Encounter (Signed)
She needs to wear oxygen at all times at 2 L/min.  If she has decreases in oxygen to that low she needs to be seen in the emergency room.

## 2020-11-12 NOTE — Telephone Encounter (Signed)
I spoke with Melody Ochoa with [employer] and she stated that Melody Ochoa didn't like the POC or couldn't figure out how to keep the alarms from going off.  Melody Ochoa ask [employer] to pick up the  POC and they delivered M6 tanks to her.  She still has the home concentrator to use for her night time 02 and has access to 02 24/7

## 2020-11-15 LAB — CUP PACEART REMOTE DEVICE CHECK
Date Time Interrogation Session: 20211204223503
Implantable Pulse Generator Implant Date: 20180918

## 2020-11-17 ENCOUNTER — Ambulatory Visit (INDEPENDENT_AMBULATORY_CARE_PROVIDER_SITE_OTHER): Payer: Medicare Other

## 2020-11-17 DIAGNOSIS — I63219 Cerebral infarction due to unspecified occlusion or stenosis of unspecified vertebral arteries: Secondary | ICD-10-CM

## 2020-11-20 DIAGNOSIS — J8410 Pulmonary fibrosis, unspecified: Secondary | ICD-10-CM | POA: Diagnosis not present

## 2020-11-25 ENCOUNTER — Ambulatory Visit: Payer: Medicare Other | Admitting: Primary Care

## 2020-11-25 NOTE — Progress Notes (Deleted)
@Patient  ID: Melody Ochoa, female    DOB: Jun 05, 1941, 79 y.o.   MRN: [id number]  No chief complaint on file.   Referring provider: Burnard Hawthorne, FNP  HPI: 79 year old female, former smoker. PMH significant for HTN, stroke, pulmonary fibrosis, pulmonary nodules, CKD. Patient of DR. Patsey Berthold, last seen on 10/14/20.   Previous [facility] pulmonary encounter: 10/14/20 This is an 79 year old remote former smoker usually followed by Dr. Ashby Dawes. She was noted to have pulmonary fibrosis. Underwent pulmonary function testing and high-resolution CT scan of the chest these have confirmed restrictive physiology related to pulmonary fibrosis. Chest did not show progression from CT performed June 2020 however some progression from 2018. Findings consistent with UIP therefore diagnosis of idiopathic pulmonary fibrosis. The patient was supposed to see Dr. Chase Caller for evaluation of her interstitial lung disease however she prefers to continue her care here in [place]. She presents today with increasing shortness of breath. She has been on nocturnal oxygen but has been reluctant to have portable oxygen previously. Cough productive of whitish to yellowish sputum.  Patient also notices wheezing. No fevers, chills or sweats. No hemoptysis. Has been compliant with nocturnal oxygen.  Review of Systems A 10 point review of systems was performed and it is as noted above otherwise negative.  11/25/2020 - interim hx  Patient presents today for 6 week follow-up. During last visit she was started on daytime oxygen and give samples of Breztri 2 puffs twice day. She called our office on 11/12/20 with reports of oxygen level 68% on 2L and increased shortness of breath. She had not been wearing her oxygen during the day. She was advised she should be wearing 2L/min oxygen at all times. She did not like POC so she returned it and [facility] delievered M6 tanks. She has a home concentrator for nocturnal oxygen.     Allergies  Allergen Reactions  . Latex Itching  . Aleve [Naproxen Sodium] Other (See Comments)    Gi upset  . Aspirin Other (See Comments)    Stomach pain-aggravates diverticulosis  . Celebrex [Celecoxib] Other (See Comments)    Dizziness   . Effexor [Venlafaxine] Other (See Comments)    Hot flashes   . Gabapentin Other (See Comments)    "Night terrors"  . Hydrocodone-Homatropine Diarrhea  . Ibuprofen Other (See Comments)    Gi upset   . Mobic [Meloxicam]     Stomach upset  . Tape Itching and Other (See Comments)    Itchy blisters  . Vioxx [Rofecoxib] Other (See Comments)    Gi upset  . Other Rash and Other (See Comments)    bandaides  . Tramadol Other (See Comments)    hallucinations    Immunization History  Administered Date(s) Administered  . Influenza, High Dose Seasonal PF 01/16/2018, 10/05/2018  . Influenza-Unspecified 09/28/2013, 10/09/2014, 09/08/2016, 09/27/2016, 09/12/2017, 01/16/2018  . PFIZER SARS-COV-2 Vaccination 01/31/2020, 02/21/2020  . Pneumococcal Conjugate-13 10/09/2014  . Pneumococcal Polysaccharide-23 02/14/2018, 10/01/2019  . Td 07/10/2014  . Tdap 07/24/2017    Past Medical History:  Diagnosis Date  . Arthritis   . Atheromatous plaque 10/18/2017  . Back pain   . Cancer ([place])    skin  . Depression   . Diverticulosis   . Dizziness    patient had episode of dizziness when came in room. hx past no dx  . GERD (gastroesophageal reflux disease)   . Heart murmur   . Hyperlipidemia 10/18/2017  . PONV (postoperative nausea and vomiting)    yrs  ago  . Squamous cell carcinoma of skin 11/16/2016   L chest parasternal  . Squamous cell carcinoma of skin 02/05/2020   L cheek inf to lat zygoma  . Stroke [facility])     Tobacco History: Social History   Tobacco Use  Smoking Status Former Smoker  . Packs/day: 0.75  . Years: 40.00  . Pack years: 30.00  . Types: Cigarettes  . Quit date: 11/13/1996  . Years since quitting: 24.0  Smokeless  Tobacco Never Used   Counseling given: Not Answered   Outpatient Medications Prior to Visit  Medication Sig Dispense Refill  . albuterol (VENTOLIN HFA) 108 (90 Base) MCG/ACT inhaler TAKE 2 PUFFS BY MOUTH EVERY 6 HOURS AS NEEDED FOR WHEEZE OR SHORTNESS OF BREATH 8.5 each 2  . atorvastatin (LIPITOR) 80 MG tablet TAKE 1 TABLET BY MOUTH DAILY AT 6 PM. NEEDS OFFICE VISIT FOR FURTHER REFILLS. 2ND ATTEMPT 90 tablet 2  . buPROPion (WELLBUTRIN XL) 300 MG 24 hr tablet Take 300 mg by mouth daily.     . clopidogrel (PLAVIX) 75 MG tablet TAKE 1 TABLET BY MOUTH EVERY DAY 90 tablet 3  . DULoxetine (CYMBALTA) 30 MG capsule Take 30 mg by mouth daily.    . famotidine (PEPCID) 20 MG tablet Take 20 mg by mouth at bedtime.   12  . fluticasone (FLONASE) 50 MCG/ACT nasal spray SPRAY 2 SPRAYS INTO EACH NOSTRIL EVERY DAY (Patient not taking: Reported on 10/14/2020) 48 mL 1  . furosemide (LASIX) 20 MG tablet Take 1 tablet (20 mg) by mouth once daily x 3 days, then take 1 tablet (20 mg) once daily as needed for swelling (Patient not taking: Reported on 09/24/2020) 10 tablet 0  . mupirocin ointment (BACTROBAN) 2 % APPLY A SMALL AMOUNT TO AFFECTED AREA ONCE A DAY TO EXCISION SITE (Patient not taking: Reported on 10/14/2020)    . pregabalin (LYRICA) 50 MG capsule 50 mg during day, 100 mg at night 90 capsule 3   No facility-administered medications prior to visit.      Review of Systems  Review of Systems   Physical Exam  There were no vitals taken for this visit. Physical Exam   Lab Results:  CBC    Component Value Date/Time   WBC 7.7 09/17/2020 1335   RBC 4.11 09/17/2020 1335   HGB 11.7 (L) 09/17/2020 1335   HGB 8.6 (L) 12/02/2014 0610   HCT 36.1 09/17/2020 1335   HCT 26.7 (L) 12/02/2014 0610   PLT 233 09/17/2020 1335   PLT 372 12/02/2014 0610   MCV 87.8 09/17/2020 1335   MCV 87 12/02/2014 0610   MCH 28.5 09/17/2020 1335   MCHC 32.4 09/17/2020 1335   RDW 14.2 09/17/2020 1335   RDW 15.1 (H)  12/02/2014 0610   LYMPHSABS 1.0 04/22/2020 1119   LYMPHSABS 1.4 12/02/2014 0610   MONOABS 0.6 04/22/2020 1119   MONOABS 0.6 12/02/2014 0610   EOSABS 0.3 04/22/2020 1119   EOSABS 0.4 12/02/2014 0610   BASOSABS 0.1 04/22/2020 1119   BASOSABS 0.1 12/02/2014 0610    BMET    Component Value Date/Time   NA 140 09/17/2020 1335   K 3.9 09/17/2020 1335   CL 104 09/17/2020 1335   CO2 27 09/17/2020 1335   GLUCOSE 104 (H) 09/17/2020 1335   BUN 19 09/17/2020 1335   CREATININE 1.34 (H) 09/17/2020 1335   CALCIUM 9.1 09/17/2020 1335   GFRNONAA 37 (L) 09/17/2020 1335   GFRAA 59 (L) 03/11/2020 0931    BNP  Component Value Date/Time   BNP 112.6 (H) 02/01/2019 1224    ProBNP No results found for: PROBNP  Imaging: CUP PACEART REMOTE DEVICE CHECK  Result Date: 11/15/2020 ILR summary report received. Battery status OK. Normal device function. No new symptom, tachy, brady, or pause episodes. No new AF episodes. Monthly summary reports and ROV/PRN    Assessment & Plan:   No problem-specific Assessment & Plan notes found for this encounter.     Martyn Ehrich, NP 11/25/2020

## 2020-12-02 NOTE — Progress Notes (Signed)
Carelink Summary Report / Loop Recorder 

## 2020-12-04 ENCOUNTER — Telehealth: Payer: Self-pay | Admitting: Pulmonary Disease

## 2020-12-04 NOTE — Telephone Encounter (Signed)
Spoke to patient.  Patient stated that she instructed to purchase pulse ox. Patient is questioning what her oxygen level should be.  Advised patient that SPO2 should be >88%.  Patient was getting a reading of 80%, however the pulse ox was giving a low battery alarm.  Patient stated that she would change the batteries and call back with readings.

## 2020-12-04 NOTE — Telephone Encounter (Signed)
received call back from patient.  She stated that her pulse ox takes AAA batteries and she does not have any at home. [facility] and [facility]. Advises patient to contact our office or seek emergency care if spo2 is <88% on 2L with new batteries.  Patient denied increased sob or additional symptoms.   Will await call back.

## 2020-12-09 ENCOUNTER — Inpatient Hospital Stay
Admission: EM | Admit: 2020-12-09 | Discharge: 2020-12-12 | DRG: 065 | Disposition: A | Discharge status: Home Health | Payer: Medicare Other | Attending: Internal Medicine | Admitting: Internal Medicine

## 2020-12-09 ENCOUNTER — Emergency Department: Payer: Medicare Other

## 2020-12-09 ENCOUNTER — Other Ambulatory Visit: Payer: Self-pay

## 2020-12-09 ENCOUNTER — Telehealth: Payer: Self-pay

## 2020-12-09 DIAGNOSIS — R52 Pain, unspecified: Secondary | ICD-10-CM | POA: Diagnosis not present

## 2020-12-09 DIAGNOSIS — Z9181 History of falling: Secondary | ICD-10-CM

## 2020-12-09 DIAGNOSIS — I129 Hypertensive chronic kidney disease with stage 1 through stage 4 chronic kidney disease, or unspecified chronic kidney disease: Secondary | ICD-10-CM | POA: Diagnosis not present

## 2020-12-09 DIAGNOSIS — M79601 Pain in right arm: Secondary | ICD-10-CM | POA: Diagnosis present

## 2020-12-09 DIAGNOSIS — Z85828 Personal history of other malignant neoplasm of skin: Secondary | ICD-10-CM | POA: Diagnosis not present

## 2020-12-09 DIAGNOSIS — Z7902 Long term (current) use of antithrombotics/antiplatelets: Secondary | ICD-10-CM

## 2020-12-09 DIAGNOSIS — W19XXXA Unspecified fall, initial encounter: Secondary | ICD-10-CM

## 2020-12-09 DIAGNOSIS — Z20822 Contact with and (suspected) exposure to covid-19: Secondary | ICD-10-CM | POA: Diagnosis not present

## 2020-12-09 DIAGNOSIS — F32A Depression, unspecified: Secondary | ICD-10-CM | POA: Diagnosis not present

## 2020-12-09 DIAGNOSIS — G9389 Other specified disorders of brain: Secondary | ICD-10-CM | POA: Diagnosis not present

## 2020-12-09 DIAGNOSIS — S4991XA Unspecified injury of right shoulder and upper arm, initial encounter: Secondary | ICD-10-CM | POA: Diagnosis not present

## 2020-12-09 DIAGNOSIS — J9611 Chronic respiratory failure with hypoxia: Secondary | ICD-10-CM | POA: Diagnosis not present

## 2020-12-09 DIAGNOSIS — Z886 Allergy status to analgesic agent status: Secondary | ICD-10-CM | POA: Diagnosis not present

## 2020-12-09 DIAGNOSIS — I10 Essential (primary) hypertension: Secondary | ICD-10-CM | POA: Diagnosis not present

## 2020-12-09 DIAGNOSIS — G8191 Hemiplegia, unspecified affecting right dominant side: Secondary | ICD-10-CM | POA: Diagnosis present

## 2020-12-09 DIAGNOSIS — Z7901 Long term (current) use of anticoagulants: Secondary | ICD-10-CM | POA: Diagnosis not present

## 2020-12-09 DIAGNOSIS — Z91040 Latex allergy status: Secondary | ICD-10-CM

## 2020-12-09 DIAGNOSIS — Y92009 Unspecified place in unspecified non-institutional (private) residence as the place of occurrence of the external cause: Secondary | ICD-10-CM | POA: Diagnosis not present

## 2020-12-09 DIAGNOSIS — I340 Nonrheumatic mitral (valve) insufficiency: Secondary | ICD-10-CM | POA: Diagnosis not present

## 2020-12-09 DIAGNOSIS — R0902 Hypoxemia: Secondary | ICD-10-CM | POA: Diagnosis not present

## 2020-12-09 DIAGNOSIS — R29700 NIHSS score 0: Secondary | ICD-10-CM | POA: Diagnosis present

## 2020-12-09 DIAGNOSIS — S0990XA Unspecified injury of head, initial encounter: Secondary | ICD-10-CM | POA: Diagnosis not present

## 2020-12-09 DIAGNOSIS — G319 Degenerative disease of nervous system, unspecified: Secondary | ICD-10-CM | POA: Diagnosis not present

## 2020-12-09 DIAGNOSIS — Z6833 Body mass index (BMI) 33.0-33.9, adult: Secondary | ICD-10-CM

## 2020-12-09 DIAGNOSIS — H5462 Unqualified visual loss, left eye, normal vision right eye: Secondary | ICD-10-CM | POA: Diagnosis present

## 2020-12-09 DIAGNOSIS — K219 Gastro-esophageal reflux disease without esophagitis: Secondary | ICD-10-CM | POA: Diagnosis present

## 2020-12-09 DIAGNOSIS — Z833 Family history of diabetes mellitus: Secondary | ICD-10-CM

## 2020-12-09 DIAGNOSIS — N1831 Chronic kidney disease, stage 3a: Secondary | ICD-10-CM | POA: Diagnosis present

## 2020-12-09 DIAGNOSIS — Z87891 Personal history of nicotine dependence: Secondary | ICD-10-CM

## 2020-12-09 DIAGNOSIS — D649 Anemia, unspecified: Secondary | ICD-10-CM | POA: Diagnosis present

## 2020-12-09 DIAGNOSIS — Z96641 Presence of right artificial hip joint: Secondary | ICD-10-CM | POA: Diagnosis present

## 2020-12-09 DIAGNOSIS — R8271 Bacteriuria: Secondary | ICD-10-CM | POA: Diagnosis present

## 2020-12-09 DIAGNOSIS — M79603 Pain in arm, unspecified: Secondary | ICD-10-CM | POA: Diagnosis not present

## 2020-12-09 DIAGNOSIS — E785 Hyperlipidemia, unspecified: Secondary | ICD-10-CM | POA: Diagnosis not present

## 2020-12-09 DIAGNOSIS — M549 Dorsalgia, unspecified: Secondary | ICD-10-CM | POA: Diagnosis present

## 2020-12-09 DIAGNOSIS — Z981 Arthrodesis status: Secondary | ICD-10-CM | POA: Diagnosis not present

## 2020-12-09 DIAGNOSIS — G459 Transient cerebral ischemic attack, unspecified: Secondary | ICD-10-CM | POA: Diagnosis not present

## 2020-12-09 DIAGNOSIS — Z79891 Long term (current) use of opiate analgesic: Secondary | ICD-10-CM

## 2020-12-09 DIAGNOSIS — Z79899 Other long term (current) drug therapy: Secondary | ICD-10-CM

## 2020-12-09 DIAGNOSIS — M25511 Pain in right shoulder: Secondary | ICD-10-CM

## 2020-12-09 DIAGNOSIS — Z8673 Personal history of transient ischemic attack (TIA), and cerebral infarction without residual deficits: Secondary | ICD-10-CM | POA: Diagnosis not present

## 2020-12-09 DIAGNOSIS — E669 Obesity, unspecified: Secondary | ICD-10-CM | POA: Diagnosis present

## 2020-12-09 DIAGNOSIS — Z9981 Dependence on supplemental oxygen: Secondary | ICD-10-CM

## 2020-12-09 DIAGNOSIS — G8929 Other chronic pain: Secondary | ICD-10-CM | POA: Diagnosis not present

## 2020-12-09 DIAGNOSIS — I6389 Other cerebral infarction: Secondary | ICD-10-CM | POA: Diagnosis not present

## 2020-12-09 DIAGNOSIS — J984 Other disorders of lung: Secondary | ICD-10-CM | POA: Diagnosis not present

## 2020-12-09 DIAGNOSIS — Z888 Allergy status to other drugs, medicaments and biological substances status: Secondary | ICD-10-CM | POA: Diagnosis not present

## 2020-12-09 DIAGNOSIS — I63549 Cerebral infarction due to unspecified occlusion or stenosis of unspecified cerebellar artery: Secondary | ICD-10-CM | POA: Diagnosis not present

## 2020-12-09 DIAGNOSIS — I6523 Occlusion and stenosis of bilateral carotid arteries: Secondary | ICD-10-CM | POA: Diagnosis present

## 2020-12-09 DIAGNOSIS — I639 Cerebral infarction, unspecified: Secondary | ICD-10-CM | POA: Diagnosis not present

## 2020-12-09 DIAGNOSIS — S199XXA Unspecified injury of neck, initial encounter: Secondary | ICD-10-CM | POA: Diagnosis not present

## 2020-12-09 DIAGNOSIS — R4781 Slurred speech: Secondary | ICD-10-CM | POA: Diagnosis present

## 2020-12-09 DIAGNOSIS — M19011 Primary osteoarthritis, right shoulder: Secondary | ICD-10-CM | POA: Diagnosis not present

## 2020-12-09 DIAGNOSIS — J8410 Pulmonary fibrosis, unspecified: Secondary | ICD-10-CM | POA: Diagnosis not present

## 2020-12-09 DIAGNOSIS — W010XXA Fall on same level from slipping, tripping and stumbling without subsequent striking against object, initial encounter: Secondary | ICD-10-CM | POA: Diagnosis present

## 2020-12-09 DIAGNOSIS — Z803 Family history of malignant neoplasm of breast: Secondary | ICD-10-CM

## 2020-12-09 DIAGNOSIS — Z801 Family history of malignant neoplasm of trachea, bronchus and lung: Secondary | ICD-10-CM

## 2020-12-09 DIAGNOSIS — Z823 Family history of stroke: Secondary | ICD-10-CM

## 2020-12-09 LAB — CBC WITH DIFFERENTIAL/PLATELET
Abs Immature Granulocytes: 0.03 10*3/uL (ref 0.00–0.07)
Basophils Absolute: 0.1 10*3/uL (ref 0.0–0.1)
Basophils Relative: 1 %
Eosinophils Absolute: 0.2 10*3/uL (ref 0.0–0.5)
Eosinophils Relative: 3 %
HCT: 32.7 % — ABNORMAL LOW (ref 36.0–46.0)
Hemoglobin: 10.4 g/dL — ABNORMAL LOW (ref 12.0–15.0)
Immature Granulocytes: 0 %
Lymphocytes Relative: 11 %
Lymphs Abs: 0.8 10*3/uL (ref 0.7–4.0)
MCH: 28.5 pg (ref 26.0–34.0)
MCHC: 31.8 g/dL (ref 30.0–36.0)
MCV: 89.6 fL (ref 80.0–100.0)
Monocytes Absolute: 0.5 10*3/uL (ref 0.1–1.0)
Monocytes Relative: 7 %
Neutro Abs: 5.6 10*3/uL (ref 1.7–7.7)
Neutrophils Relative %: 78 %
Platelets: 224 10*3/uL (ref 150–400)
RBC: 3.65 MIL/uL — ABNORMAL LOW (ref 3.87–5.11)
RDW: 14.4 % (ref 11.5–15.5)
WBC: 7.2 10*3/uL (ref 4.0–10.5)
nRBC: 0 % (ref 0.0–0.2)

## 2020-12-09 LAB — CBC
HCT: 33.7 % — ABNORMAL LOW (ref 36.0–46.0)
Hemoglobin: 10.8 g/dL — ABNORMAL LOW (ref 12.0–15.0)
MCH: 28.5 pg (ref 26.0–34.0)
MCHC: 32 g/dL (ref 30.0–36.0)
MCV: 88.9 fL (ref 80.0–100.0)
Platelets: 237 10*3/uL (ref 150–400)
RBC: 3.79 MIL/uL — ABNORMAL LOW (ref 3.87–5.11)
RDW: 14.4 % (ref 11.5–15.5)
WBC: 6.4 10*3/uL (ref 4.0–10.5)
nRBC: 0 % (ref 0.0–0.2)

## 2020-12-09 LAB — BASIC METABOLIC PANEL
Anion gap: 8 (ref 5–15)
BUN: 15 mg/dL (ref 8–23)
CO2: 27 mmol/L (ref 22–32)
Calcium: 9.1 mg/dL (ref 8.9–10.3)
Chloride: 104 mmol/L (ref 98–111)
Creatinine, Ser: 1.28 mg/dL — ABNORMAL HIGH (ref 0.44–1.00)
GFR, Estimated: 42 mL/min — ABNORMAL LOW (ref >60)
Glucose, Bld: 93 mg/dL (ref 70–99)
Potassium: 4.2 mmol/L (ref 3.5–5.1)
Sodium: 139 mmol/L (ref 135–145)

## 2020-12-09 LAB — CREATININE, SERUM
Creatinine, Ser: 1.13 mg/dL — ABNORMAL HIGH (ref 0.44–1.00)
GFR, Estimated: 49 mL/min — ABNORMAL LOW (ref >60)

## 2020-12-09 LAB — PROTIME-INR
INR: 1 (ref 0.8–1.2)
Prothrombin Time: 12.4 seconds (ref 11.4–15.2)

## 2020-12-09 MED ORDER — ACETAMINOPHEN 650 MG RE SUPP: 650.0000 mg | RECTAL | Status: DC | PRN

## 2020-12-09 MED ORDER — ENOXAPARIN SODIUM 40 MG/0.4ML ~~LOC~~ SOLN
40.0000 mg | SUBCUTANEOUS | Status: DC
  Administered 2020-12-10 – 2020-12-12 (×3): 40 mg via SUBCUTANEOUS
  Filled 2020-12-09 (×3): qty 0.4

## 2020-12-09 MED ORDER — ATORVASTATIN CALCIUM 20 MG PO TABS
80.0000 mg | ORAL_TABLET | Freq: Every day | ORAL | Status: DC
  Administered 2020-12-10 – 2020-12-11 (×2): 80 mg via ORAL
  Filled 2020-12-09 (×2): qty 4

## 2020-12-09 MED ORDER — ASPIRIN EC 81 MG PO TBEC
81.0000 mg | DELAYED_RELEASE_TABLET | Freq: Every day | ORAL | Status: DC
  Administered 2020-12-10 – 2020-12-12 (×3): 81 mg via ORAL
  Filled 2020-12-09 (×4): qty 1

## 2020-12-09 MED ORDER — ALBUTEROL SULFATE (2.5 MG/3ML) 0.083% IN NEBU
2.5000 mg | INHALATION_SOLUTION | Freq: Once | RESPIRATORY_TRACT | Status: AC
  Administered 2020-12-09: 2.5 mg via RESPIRATORY_TRACT
  Filled 2020-12-09: qty 3

## 2020-12-09 MED ORDER — CLOPIDOGREL BISULFATE 75 MG PO TABS
75.0000 mg | ORAL_TABLET | Freq: Every day | ORAL | Status: DC
  Administered 2020-12-10 – 2020-12-12 (×3): 75 mg via ORAL
  Filled 2020-12-09 (×3): qty 1

## 2020-12-09 MED ORDER — ACETAMINOPHEN 325 MG PO TABS: 650.0000 mg | ORAL_TABLET | ORAL | Status: DC | PRN

## 2020-12-09 MED ORDER — SENNOSIDES-DOCUSATE SODIUM 8.6-50 MG PO TABS: 1.0000 | ORAL_TABLET | Freq: Every evening | ORAL | Status: DC | PRN

## 2020-12-09 MED ORDER — ACETAMINOPHEN 160 MG/5ML PO SOLN
650.0000 mg | ORAL | Status: DC | PRN
  Filled 2020-12-09: qty 20.3

## 2020-12-09 MED ORDER — STROKE: EARLY STAGES OF RECOVERY BOOK: Freq: Once | Status: DC

## 2020-12-09 NOTE — ED Triage Notes (Signed)
Pt in from home via [facility] after mechanical fall on rug, take Coumadin. Landed on R side, no LOC. Did hit head, reports R shoulder and humerus pain. VSS.

## 2020-12-09 NOTE — ED Provider Notes (Signed)
[facility] Emergency Department Provider Note    Event Date/Time   First MD Initiated Contact with Patient 12/09/20 1548     (approximate)  I have reviewed the triage vital signs and the nursing notes.   HISTORY  Chief Complaint Fall on Coumadin and R shoulder pain    HPI Melody Ochoa is a 80 y.o. female below listed past medical history presents to the ER for evaluation of right shoulder pain after fall this morning.  States that when she got up this morning she was feeling foggy headed had slurred speech as well as some weakness in her right leg.  States that when she got up she did fall to the right side striking her right arm and shoulder and head.  No LOC.  States his slurred speech and weakness felt like it lasted 20 to 30 minutes and then resolved.  She does typically wear oxygen at night as needed.  Her O2 sat at that time was in the nineties.  She denied any shortness of breath or chest pain.  Called her nursing helpline who directed her to the ER as she does have a history of TIA and CVA.  She currently on Plavix.  Has not missed any doses.    Past Medical History:  Diagnosis Date  . Arthritis   . Atheromatous plaque 10/18/2017  . Back pain   . Cancer ([place])    skin  . Depression   . Diverticulosis   . Dizziness    patient had episode of dizziness when came in room. hx past no dx  . GERD (gastroesophageal reflux disease)   . Heart murmur   . Hyperlipidemia 10/18/2017  . PONV (postoperative nausea and vomiting)    yrs ago  . Squamous cell carcinoma of skin 11/16/2016   L chest parasternal  . Squamous cell carcinoma of skin 02/05/2020   L cheek inf to lat zygoma  . Stroke [facility])    Family History  Problem Relation Age of Onset  . Lung cancer Father   . Aneurysm Brother   . Stroke Brother   . Diabetes Maternal Grandmother   . Kidney disease Maternal Grandfather   . Cushing syndrome Paternal Grandmother   . Dementia Paternal  Grandfather   . Breast cancer Maternal Aunt 52   Past Surgical History:  Procedure Laterality Date  . BACK SURGERY  13  . BREAST BIOPSY Bilateral    cores "years ago"  . BREAST EXCISIONAL BIOPSY Left 90's  . CATARACT EXTRACTION W/PHACO Left 12/20/2018   Procedure: CATARACT EXTRACTION PHACO AND INTRAOCULAR LENS PLACEMENT ([place]) LEFT TOPICAL;  Surgeon: Leandrew Koyanagi, MD;  Location: [facility];  Service: Ophthalmology;  Laterality: Left;  . CATARACT EXTRACTION W/PHACO Right 01/24/2019   Procedure: CATARACT EXTRACTION PHACO AND INTRAOCULAR LENS PLACEMENT ([place]) RIGHT;  Surgeon: Leandrew Koyanagi, MD;  Location: [place];  Service: Ophthalmology;  Laterality: Right;  . CERVICAL DISC SURGERY  09  . COLONOSCOPY WITH PROPOFOL N/A 01/13/2016   Procedure: COLONOSCOPY WITH PROPOFOL;  Surgeon: [name], MD;  Location: [facility] ENDOSCOPY;  Service: Endoscopy;  Laterality: N/A;  . COLONOSCOPY WITH PROPOFOL N/A 02/19/2020   Procedure: COLONOSCOPY WITH PROPOFOL;  Surgeon: [name], MD;  Location: [facility] ENDOSCOPY;  Service: Gastroenterology;  Laterality: N/A;  . FACELIFT  94  . LOOP RECORDER INSERTION N/A 08/23/2017   Procedure: LOOP RECORDER INSERTION;  Surgeon: Thompson Grayer, MD;  Location: [place] CV LAB;  Service: Cardiovascular;  Laterality: N/A;  .  TEE WITHOUT CARDIOVERSION N/A 08/12/2017   Procedure: TRANSESOPHAGEAL ECHOCARDIOGRAM (TEE);  Surgeon: Sueanne Margarita, MD;  Location: [facility] ENDOSCOPY;  Service: Cardiovascular;  Laterality: N/A;  . TONSILLECTOMY    . TOTAL HIP ARTHROPLASTY Right 04/22/2016   Procedure: TOTAL HIP ARTHROPLASTY;  Surgeon: Corky Mull, MD;  Location: [facility] ORS;  Service: Orthopedics;  Laterality: Right;   Patient Active Problem List   Diagnosis Date Noted  . Chronic kidney disease, stage 3a ([place]) 12/09/2020  . Chronic respiratory failure with hypoxia ([place]) 12/09/2020  . Fall at home, initial encounter 12/09/2020  . Memory deficit 09/24/2020  .  Pulmonary fibrosis ([place]) 09/14/2020  . Chronic cough 09/14/2020  . Arthritis of left sacroiliac joint 08/26/2020  . Chronic left SI joint pain 08/26/2020  . CKD (chronic kidney disease) 07/23/2020  . HTN (hypertension) 04/23/2020  . Positive fecal immunochemical test   . Angiodysplasia of intestinal tract   . Transient ischemic attack (TIA) 10/08/2019  . Transient right leg weakness 09/30/2019  . Peripheral neuropathy 08/10/2019  . Primary osteoarthritis of left shoulder 07/24/2019  . Elevated blood pressure reading 06/27/2019  . Left shoulder pain 05/04/2019  . Anemia 03/19/2019  . Long term current use of opiate analgesic 01/09/2019  . Foraminal stenosis of lumbar region 11/14/2018  . Chronic pain syndrome 09/12/2018  . SOB (shortness of breath) on exertion 08/11/2018  . Dysphagia 08/11/2018  . Cough 06/30/2018  . Rash 01/16/2018  . Hyperlipidemia 10/18/2017  . History of lumbar surgery 10/04/2017  . Depression, recurrent ([place]) 09/27/2017  . Pulmonary nodules 09/27/2017  . History of CVA (cerebrovascular accident) 09/12/2017  . Cerebellar stroke, acute ([place]) 08/10/2017  . Primary osteoarthritis of left hip 02/28/2017  . Strain of left hip 02/28/2017  . B12 deficiency 12/21/2016  . Generalized OA 05/13/2016  . Status post total replacement of right hip 04/22/2016  . Personal history of colonic polyps   . Greater trochanteric bursitis of both hips 01/09/2016  . Prediabetes 08/04/2015  . Thoracic aortic atherosclerosis ([place]) 06/16/2015  . 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  . UTI (lower urinary tract infection) 11/22/2014  . Low back pain 11/22/2014  . Spondylolisthesis of lumbar region 11/18/2014  . Lumbar radiculopathy 08/21/2014  . Degeneration of lumbar or lumbosacral intervertebral disc 08/21/2014  . Left hip pain 08/21/2014  . Osteopenia 06/23/2014  . Major depression in remission ([place]) 06/23/2014  . Anxiety 06/23/2014  . Menopause 06/23/2014      Prior  to Admission medications   Medication Sig Start Date End Date Taking? Authorizing Provider  albuterol (VENTOLIN HFA) 108 (90 Base) MCG/ACT inhaler TAKE 2 PUFFS BY MOUTH EVERY 6 HOURS AS NEEDED FOR WHEEZE OR SHORTNESS OF BREATH 10/14/20   Tyler Pita, MD  atorvastatin (LIPITOR) 80 MG tablet TAKE 1 TABLET BY MOUTH DAILY AT 6 PM. NEEDS OFFICE VISIT FOR FURTHER REFILLS. 2ND ATTEMPT 05/27/20   Deboraha Sprang, MD  buPROPion (WELLBUTRIN XL) 300 MG 24 hr tablet Take 300 mg by mouth daily.  07/15/17   [provider]  clopidogrel (PLAVIX) 75 MG tablet TAKE 1 TABLET BY MOUTH EVERY DAY 06/16/20   Deboraha Sprang, MD  DULoxetine (CYMBALTA) 30 MG capsule Take 30 mg by mouth daily. 08/31/20   [provider]  famotidine (PEPCID) 20 MG tablet Take 20 mg by mouth at bedtime.  11/07/18   [provider]  fluticasone (FLONASE) 50 MCG/ACT nasal spray SPRAY 2 SPRAYS INTO EACH NOSTRIL EVERY DAY Patient not taking: Reported on 10/14/2020 09/09/20  Burnard Hawthorne, FNP  furosemide (LASIX) 20 MG tablet Take 1 tablet (20 mg) by mouth once daily x 3 days, then take 1 tablet (20 mg) once daily as needed for swelling Patient not taking: Reported on 09/24/2020 06/10/20   Deboraha Sprang, MD  mupirocin ointment (BACTROBAN) 2 % APPLY A SMALL AMOUNT TO AFFECTED AREA ONCE A DAY TO EXCISION SITE Patient not taking: Reported on 10/14/2020 02/05/20   [provider]  pregabalin (LYRICA) 50 MG capsule 50 mg during day, 100 mg at night 09/15/20   Gillis Santa, MD    Allergies Latex, Aleve [naproxen sodium], Aspirin, Celebrex [celecoxib], Effexor [venlafaxine], Gabapentin, Hydrocodone-homatropine, Ibuprofen, Mobic [meloxicam], Tape, Vioxx [rofecoxib], Other, and Tramadol    Social History Social History   Tobacco Use  . Smoking status: Former Smoker    Packs/day: 0.75    Years: 40.00    Pack years: 30.00    Types: Cigarettes    Quit date: 11/13/1996    Years since quitting: 24.0  .  Smokeless tobacco: Never Used  Vaping Use  . Vaping Use: Never used  Substance Use Topics  . Alcohol use: Yes    Comment: occ wine  . Drug use: No    Review of Systems Patient denies headaches, rhinorrhea, blurry vision, numbness, shortness of breath, chest pain, edema, cough, abdominal pain, nausea, vomiting, diarrhea, dysuria, fevers, rashes or hallucinations unless otherwise stated above in HPI. ____________________________________________   PHYSICAL EXAM:  VITAL SIGNS: Vitals:   12/09/20 1900 12/09/20 1912  BP: (!) 188/91 (!) 188/91  Pulse: 80 80  Resp:  20  Temp:    SpO2: 92% 92%    Constitutional: Alert and oriented.  Eyes: Conjunctivae are normal.  Head: Atraumatic. Nose: No congestion/rhinnorhea. Mouth/Throat: Mucous membranes are moist.   Neck: No stridor. Painless ROM.  Cardiovascular: Normal rate, regular rhythm. Grossly normal heart sounds.  Good peripheral circulation. Respiratory: Normal respiratory effort.  No retractions. Lungs CTAB. Gastrointestinal: Soft and nontender. No distention. No abdominal bruits. No CVA tenderness. Genitourinary:  Musculoskeletal: pain with palpation of right arm without deformity noted.  N/v intact distally.  No lower extremity tenderness nor edema.  No joint effusions. Neurologic: CN- intact.  No facial droop, Normal FNF.  Normal heel to shin.  Sensation intact bilaterally. Normal speech and language. No gross focal neurologic deficits are appreciated. No gait instability. Skin:  Skin is warm, dry and intact. No rash noted. Psychiatric: Mood and affect are normal. Speech and behavior are normal.  ____________________________________________   LABS (all labs ordered are listed, but only abnormal results are displayed)  Results for orders placed or performed during the hospital encounter of 12/09/20 (from the past 24 hour(s))  CBC with Differential     Status: Abnormal   Collection Time: 12/09/20  1:09 PM  Result Value Ref  Range   WBC 7.2 4.0 - 10.5 K/uL   RBC 3.65 (L) 3.87 - 5.11 MIL/uL   Hemoglobin 10.4 (L) 12.0 - 15.0 g/dL   HCT 32.7 (L) 36.0 - 46.0 %   MCV 89.6 80.0 - 100.0 fL   MCH 28.5 26.0 - 34.0 pg   MCHC 31.8 30.0 - 36.0 g/dL   RDW 14.4 11.5 - 15.5 %   Platelets 224 150 - 400 K/uL   nRBC 0.0 0.0 - 0.2 %   Neutrophils Relative % 78 %   Neutro Abs 5.6 1.7 - 7.7 K/uL   Lymphocytes Relative 11 %   Lymphs Abs 0.8 0.7 - 4.0 K/uL  Monocytes Relative 7 %   Monocytes Absolute 0.5 0.1 - 1.0 K/uL   Eosinophils Relative 3 %   Eosinophils Absolute 0.2 0.0 - 0.5 K/uL   Basophils Relative 1 %   Basophils Absolute 0.1 0.0 - 0.1 K/uL   Immature Granulocytes 0 %   Abs Immature Granulocytes 0.03 0.00 - 0.07 K/uL  Basic metabolic panel     Status: Abnormal   Collection Time: 12/09/20  1:09 PM  Result Value Ref Range   Sodium 139 135 - 145 mmol/L   Potassium 4.2 3.5 - 5.1 mmol/L   Chloride 104 98 - 111 mmol/L   CO2 27 22 - 32 mmol/L   Glucose, Bld 93 70 - 99 mg/dL   BUN 15 8 - 23 mg/dL   Creatinine, Ser 4.97 (H) 0.44 - 1.00 mg/dL   Calcium 9.1 8.9 - 02.6 mg/dL   GFR, Estimated 42 (L) >60 mL/min   Anion gap 8 5 - 15  Protime-INR     Status: None   Collection Time: 12/09/20  1:09 PM  Result Value Ref Range   Prothrombin Time 12.4 11.4 - 15.2 seconds   INR 1.0 0.8 - 1.2   ____________________________________________  EKG My review and personal interpretation at Time: 16:15   Indication: tia  Rate: 80  Rhythm: sinus Axis: normal Other: normal intervals, no stemi ____________________________________________  RADIOLOGY  I personally reviewed all radiographic images ordered to evaluate for the above acute complaints and reviewed radiology reports and findings.  These findings were personally discussed with the patient.  Please see medical record for radiology report.  ____________________________________________   PROCEDURES  Procedure(s) performed:  Procedures    Critical Care performed:  no ____________________________________________   INITIAL IMPRESSION / ASSESSMENT AND PLAN / ED COURSE  Pertinent labs & imaging results that were available during my care of the patient were reviewed by me and considered in my medical decision making (see chart for details).   DDX: cva, tia, hypoglycemia, dehydration, electrolyte abnormality, dissection, sepsis   Melody Ochoa is a 80 y.o. who presents to the ED with presentation as described above.  No evidence of fracture on exam fortunately however her presentation is suspicious for TIA versus possible CVA even.  No sign of dysrhythmia on the monitor.  She is not hypoxic.  CT imaging without evidence of acute abnormalities I discussed the case in consultation with Dr. Thomasena Edis of neurology who recommends further work-up with MRI given her history.  If MRI is negative may be appropriate for outpatient follow-up.  The patient will be placed on continuous pulse oximetry and telemetry for monitoring.  Laboratory evaluation will be sent to evaluate for the above complaints.     Clinical Course as of 12/09/20 2055  Tue Dec 09, 2020  1747 CT imaging is reassuring.  Currently awaiting MRI.  Patient remains hemodynamically stable.  States that she would like an albuterol treatment as she typically takes these around this time at home.  She is not hypoxic.  Will order albuterol while awaiting MRI. [PR]  2025 Patient with evidence of acute stroke on MRI.  Per recommendation by neurology will discuss with hospitalist for admission.  Patient updated and agreeable to plan. [PR]    Clinical Course User Index [PR] Willy Eddy, MD    The patient was evaluated in Emergency Department today for the symptoms described in the history of present illness. He/she was evaluated in the context of the global COVID-19 pandemic, which necessitated consideration that the patient  might be at risk for infection with the SARS-CoV-2 virus that causes COVID-19.  Institutional protocols and algorithms that pertain to the evaluation of patients at risk for COVID-19 are in a state of rapid change based on information released by regulatory bodies including the CDC and federal and state organizations. These policies and algorithms were followed during the patient's care in the ED.  As part of my medical decision making, I reviewed the following data within the [place] notes reviewed and incorporated, Labs reviewed, notes from prior ED visits and [place] Controlled Substance Database   ____________________________________________   FINAL CLINICAL IMPRESSION(S) / ED DIAGNOSES  Final diagnoses:  Cerebrovascular accident (CVA), unspecified mechanism ([place])      NEW MEDICATIONS STARTED DURING THIS VISIT:  New Prescriptions   No medications on file     Note:  This document was prepared using Dragon voice recognition software and may include unintentional dictation errors.    Merlyn Lot, MD 12/09/20 2055

## 2020-12-09 NOTE — ED Notes (Signed)
Pt assisted to bsc. Pt denies any weakness or numbness. Pt endorsing pain in RUE from where they fell.Melody Ochoa

## 2020-12-09 NOTE — H&P (Signed)
History and Physical    Melody Ochoa [name] DOB: 12-Jan-1941 DOA: 12/09/2020  PCP: Burnard Hawthorne, FNP   Patient coming from: Home  I have personally briefly reviewed patient's old medical records in [place]  Chief Complaint: Fall  HPI: Melody Ochoa is a 80 y.o. female with medical history significant for history of CVA four years prior treated with tPA, chronic back pain on chronic opiates, ambulant with a cane,, pulmonary fibrosis on home O2 at 2 L, depression, who presents to the emergency room bi EMS following a fall at home.  Patient states that when she awoke several hours earlier she dizzy and her speech was slurred and felt weakness in the right leg.  As she got up she fell onto her right side striking her head, right arm and shoulder. Her husband tried to help her up but she couldn't get up so they called EMS.  The weakness and slurred speech lasted 20 to 30 minutes and then resolved however she does have residual dizziness.  She denied headache or visual disturbance.  Denies palpitations, chest pain, shortness of breath, cough fever or chills.  She arrived to the emergency room more than 4 hours after the symptoms ED Course: On arrival, BP 188/91, pulse 80, O2 sat 92% on room air.  Afebrile.  She had an extensive trauma work-up to include CT scan of the head, C-spine as well as right shoulder and humerus which showed no acute injury.  Her blood work was unremarkable.  Creatinine 1.28 which is near her baseline of 1.18.  Hemoglobin 10.4 close to the baseline of 11.7.  The emergency room provider spoke with neurologist, Dr. Theda Sers who recommended getting an MRI with discharge if negative and admission if positive.  MRI was subsequently done which revealed "Small acute right cerebellar infarct.Multiple chronic infarcts and chronic microvascular ischemic changes as seen previously". EKG as reviewed by me : NSR at 80 with no acute ST-T wave changes  Hospitalist  consulted for admission.:  Review of Systems: As per HPI otherwise all other systems on review of systems negative.    Past Medical History:  Diagnosis Date  . Arthritis   . Atheromatous plaque 10/18/2017  . Back pain   . Cancer ([place])    skin  . Depression   . Diverticulosis   . Dizziness    patient had episode of dizziness when came in room. hx past no dx  . GERD (gastroesophageal reflux disease)   . Heart murmur   . Hyperlipidemia 10/18/2017  . PONV (postoperative nausea and vomiting)    yrs ago  . Squamous cell carcinoma of skin 11/16/2016   L chest parasternal  . Squamous cell carcinoma of skin 02/05/2020   L cheek inf to lat zygoma  . Stroke [facility])     Past Surgical History:  Procedure Laterality Date  . BACK SURGERY  13  . BREAST BIOPSY Bilateral    cores "years ago"  . BREAST EXCISIONAL BIOPSY Left 90's  . CATARACT EXTRACTION W/PHACO Left 12/20/2018   Procedure: CATARACT EXTRACTION PHACO AND INTRAOCULAR LENS PLACEMENT ([place]) LEFT TOPICAL;  Surgeon: Leandrew Koyanagi, MD;  Location: [facility];  Service: Ophthalmology;  Laterality: Left;  . CATARACT EXTRACTION W/PHACO Right 01/24/2019   Procedure: CATARACT EXTRACTION PHACO AND INTRAOCULAR LENS PLACEMENT ([place]) RIGHT;  Surgeon: Leandrew Koyanagi, MD;  Location: [facility];  Service: Ophthalmology;  Laterality: Right;  . CERVICAL DISC SURGERY  09  . COLONOSCOPY WITH PROPOFOL N/A 01/13/2016  Procedure: COLONOSCOPY WITH PROPOFOL;  Surgeon: [name], MD;  Location: [facility] ENDOSCOPY;  Service: Endoscopy;  Laterality: N/A;  . COLONOSCOPY WITH PROPOFOL N/A 02/19/2020   Procedure: COLONOSCOPY WITH PROPOFOL;  Surgeon: [name], MD;  Location: [facility] ENDOSCOPY;  Service: Gastroenterology;  Laterality: N/A;  . FACELIFT  94  . LOOP RECORDER INSERTION N/A 08/23/2017   Procedure: LOOP RECORDER INSERTION;  Surgeon: Thompson Grayer, MD;  Location: [place] CV LAB;  Service: Cardiovascular;  Laterality: N/A;  .  TEE WITHOUT CARDIOVERSION N/A 08/12/2017   Procedure: TRANSESOPHAGEAL ECHOCARDIOGRAM (TEE);  Surgeon: Sueanne Margarita, MD;  Location: [facility] ENDOSCOPY;  Service: Cardiovascular;  Laterality: N/A;  . TONSILLECTOMY    . TOTAL HIP ARTHROPLASTY Right 04/22/2016   Procedure: TOTAL HIP ARTHROPLASTY;  Surgeon: Corky Mull, MD;  Location: [facility] ORS;  Service: Orthopedics;  Laterality: Right;     reports that she quit smoking about 24 years ago. Her smoking use included cigarettes. She has a 30.00 pack-year smoking history. She has never used smokeless tobacco. She reports current alcohol use. She reports that she does not use drugs.  Allergies  Allergen Reactions  . Latex Itching  . Aleve [Naproxen Sodium] Other (See Comments)    Gi upset  . Aspirin Other (See Comments)    Stomach pain-aggravates diverticulosis  . Celebrex [Celecoxib] Other (See Comments)    Dizziness   . Effexor [Venlafaxine] Other (See Comments)    Hot flashes   . Gabapentin Other (See Comments)    "Night terrors"  . Hydrocodone-Homatropine Diarrhea  . Ibuprofen Other (See Comments)    Gi upset   . Mobic [Meloxicam]     Stomach upset  . Tape Itching and Other (See Comments)    Itchy blisters  . Vioxx [Rofecoxib] Other (See Comments)    Gi upset  . Other Rash and Other (See Comments)    bandaides  . Tramadol Other (See Comments)    hallucinations    Family History  Problem Relation Age of Onset  . Lung cancer Father   . Aneurysm Brother   . Stroke Brother   . Diabetes Maternal Grandmother   . Kidney disease Maternal Grandfather   . Cushing syndrome Paternal Grandmother   . Dementia Paternal Grandfather   . Breast cancer Maternal Aunt 52      Prior to Admission medications   Medication Sig Start Date End Date Taking? Authorizing Provider  albuterol (VENTOLIN HFA) 108 (90 Base) MCG/ACT inhaler TAKE 2 PUFFS BY MOUTH EVERY 6 HOURS AS NEEDED FOR WHEEZE OR SHORTNESS OF BREATH 10/14/20   Tyler Pita, MD   atorvastatin (LIPITOR) 80 MG tablet TAKE 1 TABLET BY MOUTH DAILY AT 6 PM. NEEDS OFFICE VISIT FOR FURTHER REFILLS. 2ND ATTEMPT 05/27/20   Deboraha Sprang, MD  buPROPion (WELLBUTRIN XL) 300 MG 24 hr tablet Take 300 mg by mouth daily.  07/15/17   [provider]  clopidogrel (PLAVIX) 75 MG tablet TAKE 1 TABLET BY MOUTH EVERY DAY 06/16/20   Deboraha Sprang, MD  DULoxetine (CYMBALTA) 30 MG capsule Take 30 mg by mouth daily. 08/31/20   [provider]  famotidine (PEPCID) 20 MG tablet Take 20 mg by mouth at bedtime.  11/07/18   [provider]  fluticasone (FLONASE) 50 MCG/ACT nasal spray SPRAY 2 SPRAYS INTO EACH NOSTRIL EVERY DAY Patient not taking: Reported on 10/14/2020 09/09/20   Burnard Hawthorne, FNP  furosemide (LASIX) 20 MG tablet Take 1 tablet (20 mg) by mouth once daily x  3 days, then take 1 tablet (20 mg) once daily as needed for swelling Patient not taking: Reported on 09/24/2020 06/10/20   Deboraha Sprang, MD  mupirocin ointment (BACTROBAN) 2 % APPLY A SMALL AMOUNT TO AFFECTED AREA ONCE A DAY TO EXCISION SITE Patient not taking: Reported on 10/14/2020 02/05/20   [provider]  pregabalin (LYRICA) 50 MG capsule 50 mg during day, 100 mg at night 09/15/20   Gillis Santa, MD    Physical Exam: Vitals:   12/09/20 1815 12/09/20 1845 12/09/20 1900 12/09/20 1912  BP:   (!) 188/91 (!) 188/91  Pulse: 80 79 80 80  Resp:    20  Temp:      TempSrc:      SpO2: 96% 92% 92% 92%     Vitals:   12/09/20 1815 12/09/20 1845 12/09/20 1900 12/09/20 1912  BP:   (!) 188/91 (!) 188/91  Pulse: 80 79 80 80  Resp:    20  Temp:      TempSrc:      SpO2: 96% 92% 92% 92%      Constitutional: Alert and oriented x 3 . Not in any apparent distress HEENT:      Head: Normocephalic and atraumatic.         Eyes: PERLA, EOMI, Conjunctivae are normal. Sclera is non-icteric.       Mouth/Throat: Mucous membranes are moist.       Neck: Supple with no signs of  meningismus. Cardiovascular: Regular rate and rhythm. No murmurs, gallops, or rubs. 2+ symmetrical distal pulses are present . No JVD. No LE edema Respiratory: Respiratory effort normal .Lungs sounds clear bilaterally. No wheezes, crackles, or rhonchi.  Gastrointestinal: Soft, non tender, and non distended with positive bowel sounds.  Genitourinary: No CVA tenderness. Musculoskeletal: Nontender with normal range of motion in all extremities. No cyanosis, or erythema of extremities. Neurologic:  Face is symmetric. Moving all extremities. No gross focal neurologic deficits . Skin: Skin is warm, dry.  No rash or ulcers Psychiatric: Mood and affect are normal    Labs on Admission: I have personally reviewed following labs and imaging studies  CBC: Recent Labs  Lab 12/09/20 1309  WBC 7.2  NEUTROABS 5.6  HGB 10.4*  HCT 32.7*  MCV 89.6  PLT [id number]   Basic Metabolic Panel: Recent Labs  Lab 12/09/20 1309  NA 139  K 4.2  CL 104  CO2 27  GLUCOSE 93  BUN 15  CREATININE 1.28*  CALCIUM 9.1   GFR: CrCl cannot be calculated (Unknown ideal weight.). Liver Function Tests: No results for input(s): AST, ALT, ALKPHOS, BILITOT, PROT, ALBUMIN in the last 168 hours. No results for input(s): LIPASE, AMYLASE in the last 168 hours. No results for input(s): AMMONIA in the last 168 hours. Coagulation Profile: Recent Labs  Lab 12/09/20 1309  INR 1.0   Cardiac Enzymes: No results for input(s): CKTOTAL, CKMB, CKMBINDEX, TROPONINI in the last 168 hours. BNP (last 3 results) No results for input(s): PROBNP in the last 8760 hours. HbA1C: No results for input(s): HGBA1C in the last 72 hours. CBG: No results for input(s): GLUCAP in the last 168 hours. Lipid Profile: No results for input(s): CHOL, HDL, LDLCALC, TRIG, CHOLHDL, LDLDIRECT in the last 72 hours. Thyroid Function Tests: No results for input(s): TSH, T4TOTAL, FREET4, T3FREE, THYROIDAB in the last 72 hours. Anemia Panel: No results for  input(s): VITAMINB12, FOLATE, FERRITIN, TIBC, IRON, RETICCTPCT in the last 72 hours. Urine analysis:    Component Value  Date/Time   COLORURINE YELLOW (A) 09/17/2020 1616   APPEARANCEUR HAZY (A) 09/17/2020 1616   LABSPEC 1.020 09/17/2020 1616   PHURINE 5.0 09/17/2020 1616   GLUCOSEU NEGATIVE 09/17/2020 1616   HGBUR NEGATIVE 09/17/2020 1616   BILIRUBINUR NEGATIVE 09/17/2020 1616   BILIRUBINUR neg 12/13/2019 1537   KETONESUR NEGATIVE 09/17/2020 1616   PROTEINUR NEGATIVE 09/17/2020 1616   UROBILINOGEN 0.2 12/13/2019 1537   UROBILINOGEN 1.0 11/22/2014 0033   NITRITE NEGATIVE 09/17/2020 1616   LEUKOCYTESUR NEGATIVE 09/17/2020 1616    Radiological Exams on Admission: DG Shoulder Right  Result Date: 12/09/2020 CLINICAL DATA:  Right shoulder and upper arm pain after trip and fall over a rug today. Initial encounter. EXAM: RIGHT SHOULDER - 2+ VIEW COMPARISON:  None. FINDINGS: There is no evidence of fracture or dislocation. Mild-to-moderate acromioclavicular and glenohumeral osteoarthritis noted. Soft tissues are unremarkable. IMPRESSION: No acute abnormality. Electronically Signed   By: Drusilla Kanner M.D.   On: 12/09/2020 13:52   CT Head Wo Contrast  Result Date: 12/09/2020 CLINICAL DATA:  Head injury after fall. EXAM: CT HEAD WITHOUT CONTRAST TECHNIQUE: Contiguous axial images were obtained from the base of the skull through the vertex without intravenous contrast. COMPARISON:  September 17, 2020. FINDINGS: Brain: Mild diffuse cortical atrophy is noted. Old right frontal and posterior parietal infarctions are noted. No mass effect or midline shift is noted. Ventricular size is within normal limits. There is no evidence of mass lesion, hemorrhage or acute infarction. Vascular: No hyperdense vessel or unexpected calcification. Skull: Normal. Negative for fracture or focal lesion. Sinuses/Orbits: No acute finding. Other: None. IMPRESSION: Mild diffuse cortical atrophy. Old right frontal and posterior  parietal infarctions. No acute intracranial abnormality seen. Electronically Signed   By: Lupita Raider M.D.   On: 12/09/2020 15:37   CT Cervical Spine Wo Contrast  Result Date: 12/09/2020 CLINICAL DATA:  80 year old female with neck trauma. EXAM: CT CERVICAL SPINE WITHOUT CONTRAST TECHNIQUE: Multidetector CT imaging of the cervical spine was performed without intravenous contrast. Multiplanar CT image reconstructions were also generated. COMPARISON:  Cervical spine CT dated 09/17/2020. FINDINGS: Alignment: No acute subluxation. Skull base and vertebrae: No acute fracture. C5-C6 decompression and fusion. Soft tissues and spinal canal: No prevertebral fluid or swelling. No visible canal hematoma. Disc levels:  Multilevel degenerative changes. Upper chest: Biapical linear scarring. Other: None IMPRESSION: 1. No acute/traumatic cervical spine pathology. 2. C5-C6 decompression and fusion. Electronically Signed   By: Elgie Collard M.D.   On: 12/09/2020 17:43   MR BRAIN WO CONTRAST  Result Date: 12/09/2020 CLINICAL DATA:  TIA EXAM: MRI HEAD WITHOUT CONTRAST TECHNIQUE: Multiplanar, multiecho pulse sequences of the brain and surrounding structures were obtained without intravenous contrast. COMPARISON:  09/30/2019 FINDINGS: Brain: Small subcentimeter focus of restricted diffusion is present in the right cerebellum. Multiple chronic infarcts are again identified including involvement of the right frontal lobe, left occipitoparietal lobe, and left cerebellum. There are chronic blood products associated with the areas of chronic infarction. Additional focus of susceptibility in the inferior right parietal lobe likely reflects a chronic microhemorrhage. Other patchy T2 hyperintensity in the supratentorial white matter is nonspecific but most likely reflects stable mild chronic microvascular ischemic changes. Prominence of the ventricles and sulci reflects stable parenchymal volume loss. There is no intracranial mass  or mass effect. There is no hydrocephalus or extra-axial fluid collection. Vascular: Major vessel flow voids at the skull base are preserved. Skull and upper cervical spine: Normal marrow signal is preserved. Sinuses/Orbits: Paranasal sinuses are  aerated. Bilateral lens replacements. Other: Sella is unremarkable.  Mastoid air cells are clear. IMPRESSION: Small acute right cerebellar infarct. Multiple chronic infarcts and chronic microvascular ischemic changes as seen previously. Electronically Signed   By: Macy Mis M.D.   On: 12/09/2020 20:09   DG Humerus Right  Result Date: 12/09/2020 CLINICAL DATA:  Right shoulder and upper arm pain after trip and fall over a rug today. Initial encounter. EXAM: RIGHT HUMERUS - 2+ VIEW COMPARISON:  None. FINDINGS: There is no evidence of fracture or other focal bone lesions. Soft tissues are unremarkable. IMPRESSION: Negative exam. Electronically Signed   By: Inge Rise M.D.   On: 12/09/2020 13:52     Assessment/Plan 80 year old female with history of CVA four years ago treated with tPA and with no residual deficits, chronic back pain on chronic opiates, pulmonary fibrosis on home O2 at 2 L, CKD 3a, chronic anemia, depression, presenting with a 30-minute episode of right-sided weakness and slurred speech resulting in a fall   Cerebellar stroke, acute ([place])   History of CVA (cerebrovascular accident)   Fall at home, initial encounter -Episode of weakness, dizziness resulting in a fall at home with no acute injury on imaging studies -Episode lasted 30 minutes with no residual focal deficits -Patient arrived outside for TPA window -Neurology was consulted, recommending admission -Continue home atorvastatin 80 and Plavix 75.  EC aspirin 81 added -Cardiac monitoring, carotid Doppler.  Patient had echo 08/2020 that showed EF 50 to 55% with grade 1 diastolic dysfunction and no other major abnormalities -Allow permissive hypertension -PT OT and speech therapy  evaluations and TOC consult  Right arm pain --pain management --sling if needed --PT consult    Long term current use of opiate analgesic for chronic pain -Resume home analgesics    Anemia, chronic -Hemoglobin 10.7, around baseline of 11.7    HTN (hypertension) -Hold home antihypertensives for permissive hypertension.  Treat only if systolic over [id number]    Pulmonary fibrosis ([facility])   Chronic respiratory failure with hypoxia ([facility]) -O2 sat 92% -Continue home O2 to keep sats over 90 -Continue albuterol as needed  Chronic kidney disease, stage 3a ([facility]) -Renal function at baseline with creatinine 1.28.    DVT prophylaxis: Lovenox  Code Status: full code  Family Communication:  none  Disposition Plan: Back to previous home environment Consults called: Neurology Status: Observation    Athena Masse MD [facility]     12/09/2020, 8:56 PM

## 2020-12-09 NOTE — ED Notes (Signed)
Patient transported to MRI 

## 2020-12-09 NOTE — Telephone Encounter (Signed)
Spoke to patient, who stated that she put new batteries in her pulse ox and she is now getting readings of 95-100%. Nothing further needed at this time.

## 2020-12-09 NOTE — Telephone Encounter (Signed)
Received call from patient with c/o leg weakness, confusion and slurred speech. She is concerned about a stroke, as she has a hx of CVA.  Patient does wear 2L cont. Per patient spo2 was in the 90's. She is unsure of HR.  I have advised patient to go to ED via EMS for evaluation due to sx and hx.  Patient voiced her understanding and had no further questions.  Nothing further needed.

## 2020-12-10 ENCOUNTER — Encounter: Payer: Self-pay | Admitting: Internal Medicine

## 2020-12-10 ENCOUNTER — Ambulatory Visit: Payer: Medicare Other | Admitting: Dermatology

## 2020-12-10 ENCOUNTER — Inpatient Hospital Stay (HOSPITAL_COMMUNITY)
Admit: 2020-12-10 | Discharge: 2020-12-10 | Disposition: A | Discharge status: Home / Self Care | Payer: Medicare Other | Attending: Internal Medicine | Admitting: Internal Medicine

## 2020-12-10 ENCOUNTER — Inpatient Hospital Stay: Payer: Medicare Other

## 2020-12-10 ENCOUNTER — Observation Stay: Payer: Medicare Other

## 2020-12-10 ENCOUNTER — Ambulatory Visit: Payer: Medicare Other | Admitting: Primary Care

## 2020-12-10 DIAGNOSIS — Z20822 Contact with and (suspected) exposure to covid-19: Secondary | ICD-10-CM | POA: Diagnosis present

## 2020-12-10 DIAGNOSIS — I6389 Other cerebral infarction: Secondary | ICD-10-CM

## 2020-12-10 DIAGNOSIS — J9611 Chronic respiratory failure with hypoxia: Secondary | ICD-10-CM | POA: Diagnosis not present

## 2020-12-10 DIAGNOSIS — Z7901 Long term (current) use of anticoagulants: Secondary | ICD-10-CM | POA: Diagnosis not present

## 2020-12-10 DIAGNOSIS — R4781 Slurred speech: Secondary | ICD-10-CM | POA: Diagnosis present

## 2020-12-10 DIAGNOSIS — G8191 Hemiplegia, unspecified affecting right dominant side: Secondary | ICD-10-CM | POA: Diagnosis present

## 2020-12-10 DIAGNOSIS — I6523 Occlusion and stenosis of bilateral carotid arteries: Secondary | ICD-10-CM | POA: Diagnosis not present

## 2020-12-10 DIAGNOSIS — N1831 Chronic kidney disease, stage 3a: Secondary | ICD-10-CM | POA: Diagnosis not present

## 2020-12-10 DIAGNOSIS — I639 Cerebral infarction, unspecified: Secondary | ICD-10-CM | POA: Diagnosis not present

## 2020-12-10 DIAGNOSIS — J8410 Pulmonary fibrosis, unspecified: Secondary | ICD-10-CM | POA: Diagnosis present

## 2020-12-10 DIAGNOSIS — I340 Nonrheumatic mitral (valve) insufficiency: Secondary | ICD-10-CM | POA: Diagnosis not present

## 2020-12-10 DIAGNOSIS — F32A Depression, unspecified: Secondary | ICD-10-CM | POA: Diagnosis present

## 2020-12-10 DIAGNOSIS — Z91040 Latex allergy status: Secondary | ICD-10-CM | POA: Diagnosis not present

## 2020-12-10 DIAGNOSIS — I771 Stricture of artery: Secondary | ICD-10-CM | POA: Diagnosis not present

## 2020-12-10 DIAGNOSIS — I6522 Occlusion and stenosis of left carotid artery: Secondary | ICD-10-CM | POA: Diagnosis not present

## 2020-12-10 DIAGNOSIS — W19XXXA Unspecified fall, initial encounter: Secondary | ICD-10-CM | POA: Diagnosis not present

## 2020-12-10 DIAGNOSIS — I129 Hypertensive chronic kidney disease with stage 1 through stage 4 chronic kidney disease, or unspecified chronic kidney disease: Secondary | ICD-10-CM | POA: Diagnosis present

## 2020-12-10 DIAGNOSIS — I63549 Cerebral infarction due to unspecified occlusion or stenosis of unspecified cerebellar artery: Secondary | ICD-10-CM | POA: Diagnosis present

## 2020-12-10 DIAGNOSIS — Z8673 Personal history of transient ischemic attack (TIA), and cerebral infarction without residual deficits: Secondary | ICD-10-CM | POA: Diagnosis not present

## 2020-12-10 DIAGNOSIS — I708 Atherosclerosis of other arteries: Secondary | ICD-10-CM | POA: Diagnosis not present

## 2020-12-10 DIAGNOSIS — Z79891 Long term (current) use of opiate analgesic: Secondary | ICD-10-CM | POA: Diagnosis not present

## 2020-12-10 DIAGNOSIS — W010XXA Fall on same level from slipping, tripping and stumbling without subsequent striking against object, initial encounter: Secondary | ICD-10-CM | POA: Diagnosis present

## 2020-12-10 DIAGNOSIS — K219 Gastro-esophageal reflux disease without esophagitis: Secondary | ICD-10-CM | POA: Diagnosis present

## 2020-12-10 DIAGNOSIS — M549 Dorsalgia, unspecified: Secondary | ICD-10-CM | POA: Diagnosis present

## 2020-12-10 DIAGNOSIS — E669 Obesity, unspecified: Secondary | ICD-10-CM | POA: Diagnosis present

## 2020-12-10 DIAGNOSIS — Y92009 Unspecified place in unspecified non-institutional (private) residence as the place of occurrence of the external cause: Secondary | ICD-10-CM | POA: Diagnosis not present

## 2020-12-10 DIAGNOSIS — Z886 Allergy status to analgesic agent status: Secondary | ICD-10-CM | POA: Diagnosis not present

## 2020-12-10 DIAGNOSIS — I10 Essential (primary) hypertension: Secondary | ICD-10-CM | POA: Diagnosis not present

## 2020-12-10 DIAGNOSIS — E785 Hyperlipidemia, unspecified: Secondary | ICD-10-CM | POA: Diagnosis present

## 2020-12-10 DIAGNOSIS — G8929 Other chronic pain: Secondary | ICD-10-CM | POA: Diagnosis present

## 2020-12-10 DIAGNOSIS — M79601 Pain in right arm: Secondary | ICD-10-CM | POA: Diagnosis present

## 2020-12-10 DIAGNOSIS — R8271 Bacteriuria: Secondary | ICD-10-CM | POA: Diagnosis present

## 2020-12-10 DIAGNOSIS — Z9981 Dependence on supplemental oxygen: Secondary | ICD-10-CM | POA: Diagnosis not present

## 2020-12-10 DIAGNOSIS — D649 Anemia, unspecified: Secondary | ICD-10-CM | POA: Diagnosis present

## 2020-12-10 DIAGNOSIS — Z6833 Body mass index (BMI) 33.0-33.9, adult: Secondary | ICD-10-CM | POA: Diagnosis not present

## 2020-12-10 DIAGNOSIS — Z888 Allergy status to other drugs, medicaments and biological substances status: Secondary | ICD-10-CM | POA: Diagnosis not present

## 2020-12-10 DIAGNOSIS — Z85828 Personal history of other malignant neoplasm of skin: Secondary | ICD-10-CM | POA: Diagnosis not present

## 2020-12-10 LAB — LIPID PANEL
Cholesterol: 118 mg/dL (ref 0–200)
HDL: 57 mg/dL (ref >40)
LDL Cholesterol: 47 mg/dL (ref 0–99)
Total CHOL/HDL Ratio: 2.1 RATIO
Triglycerides: 70 mg/dL (ref <150)
VLDL: 14 mg/dL (ref 0–40)

## 2020-12-10 LAB — HEMOGLOBIN A1C
Hgb A1c MFr Bld: 5 % (ref 4.8–5.6)
Mean Plasma Glucose: 96.8 mg/dL

## 2020-12-10 LAB — SARS CORONAVIRUS 2 (TAT 6-24 HRS): SARS Coronavirus 2: NEGATIVE

## 2020-12-10 MED ORDER — IOHEXOL 350 MG/ML SOLN
75.0000 mL | Freq: Once | INTRAVENOUS | Status: AC | PRN
  Administered 2020-12-10: 75 mL via INTRAVENOUS
  Filled 2020-12-10: qty 75

## 2020-12-10 NOTE — Evaluation (Signed)
Physical Therapy Evaluation Patient Details Name: Melody Ochoa MRN: [id number] DOB: [birth year] Today's Date: 12/10/2020   History of Present Illness  80 y.o. female with medical history significant for history of CVA four years prior treated with tPA, chronic back pain on chronic opiates, ambulant with a cane,, pulmonary fibrosis on home O2 at 2 L, depression, who presents to the emergency room bi EMS following a fall at home.  Patient states that when she awoke several hours earlier she dizzy and her speech was slurred and felt weakness in the right leg.  As she got up she fell onto her right side striking her head, right arm and shoulder. Her husband tried to help her up but she couldn't get up so they called EMS.  The weakness and slurred speech lasted 20 to 30 minutes and then resolved however she does have residual dizziness.  She denied headache or visual disturbance.  Denies palpitations, chest pain, shortness of breath, cough fever or chills.  She arrived to the emergency room more than 4 hours after the symptoms.  Imaging reveals small acute cerebellar infarct.  Clinical Impression  Pt eager to work with PT, but did have multiple issues that limited her ability to fully participate.  She has recently started using O2 at night at home, she was in the high 90s on arrival on 1L, stayed in the 90s most of the time on room air but readings were labile from 86-98% t/o the session - the only time she had any real fatigued or shortness of breath was after modest ambulation bout.  She has no AROM in R shoulder and could not tolerate passive elevation >~60 2/2 pain, suspect at least some acute RTC involvement.  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  She has had multiple falls at home and fortunately has been able to avoid significant injury until this point.  Discussed plan with pt and husband and they agree that she is not currently safe to return home and that she will need some time at rehab to be able to get back home safely.      Follow Up Recommendations SNF    Equipment Recommendations  None recommended by PT    Recommendations for Other Services       Precautions / Restrictions Precautions Precautions: Fall Restrictions Weight Bearing Restrictions: No      Mobility  Bed Mobility Overal bed mobility: Needs Assistance Bed Mobility: Supine to Sit;Sit to Supine Rolling: Min assist   Supine to sit: Min assist Sit to supine: Mod assist   General bed mobility comments: Pt showed some effort with getting LEs back into bed, but ultimately needed considerable assist    Transfers Overall transfer level: Needs assistance Equipment used: Rolling walker (2 wheeled) Transfers: Sit to/from Stand Sit to Stand: Min assist         General transfer comment: Pt needed only light assist to guide hips to standing, struggled to get R hand to walker 2/2 shoulder pain/limitations  Ambulation/Gait Ambulation/Gait assistance: Min assist Gait Distance (Feet): 20 Feet Assistive device: Rolling walker (2 wheeled)       General Gait Details: Pt with good effort but poor execution with ambulation;  L foot trailing, veering L, shortness of breath with modest effort, far from her baseline  Stairs            Wheelchair Mobility    Modified Rankin (Stroke Patients Only)       Balance Overall balance assessment: Needs assistance Sitting-balance support: Feet unsupported;No upper extremity supported Sitting balance-Leahy Scale: Fair     Standing balance support: During functional activity Standing balance-Leahy Scale: Fair                                Pertinent Vitals/Pain Pain Assessment: Faces Faces Pain Scale: Hurts whole lot Pain Location: R UE (shoulder and forearm) Pain Descriptors / Indicators: Aching;Discomfort Pain Intervention(s): Limited activity within patient's tolerance;Repositioned    Home Living Family/patient expects to be discharged to:: Unsure Living Arrangements: Spouse/significant other Available Help at Discharge: Family;Available 24 hours/day Type of Home: House Home Access: Stairs to enter Entrance Stairs-Rails: Right;Left;Can reach both Entrance Stairs-Number of Steps: 4 Home Layout: One level Home Equipment: Walker - 2 wheels;Cane - single point;Bedside commode;Shower seat - built in      Prior Function Level of Independence: Independent with assistive device(s)         Comments: Pt apparently uses cane most of the time, and does not regularly use the walker.  Had had numerous and repeated falls, no longer driving.     Hand Dominance        Extremity/Trunk Assessment   Upper Extremity Assessment Upper Extremity Assessment: Generalized weakness RUE Deficits / Details: R elbow flx/ext functional; no AROM and poor PROM tolerance on the R, L WFL strength with minimal coordination issues RUE: Unable to fully assess due to pain    Lower Extremity Assessment Lower Extremity Assessment: Overall WFL for tasks assessed (equal bilaterally)       Communication   Communication: No difficulties  Cognition Arousal/Alertness: Awake/alert Behavior During Therapy: WFL for tasks assessed/performed Overall Cognitive Status: Within Functional Limits for tasks assessed                                        General Comments      Exercises     Assessment/Plan    PT Assessment Patient needs continued PT services  PT Problem List Decreased strength;Decreased range of motion;Decreased activity tolerance;Decreased balance;Decreased mobility;Decreased coordination;Decreased  knowledge of use of DME;Decreased safety awareness;Pain       PT Treatment Interventions DME instruction;Functional mobility training;Gait training;Therapeutic activities;Therapeutic exercise;Balance training;Neuromuscular re-education;Patient/family education    PT Goals (Current goals can be found in the Care Plan section)  Acute Rehab PT Goals Patient Stated Goal: control pain in R shoulder PT Goal Formulation: With patient Time For Goal Achievement: 12/24/20 Potential to Achieve Goals: Fair    Frequency QD   Barriers to discharge        Co-evaluation               AM-PAC PT "6 Clicks" Mobility  Outcome Measure Help needed turning from your back to your side while in a flat bed without using bedrails?: A Little Help needed moving from lying on your back to sitting on the side of a flat bed without using bedrails?: A Lot Help needed moving to and from a bed to a chair (including a wheelchair)?: A Lot Help needed standing up from a chair using  your arms (e.g., wheelchair or bedside chair)?: A Little Help needed to walk in hospital room?: A Lot Help needed climbing 3-5 steps with a railing? : A Lot 6 Click Score: 14    End of Session Equipment Utilized During Treatment: Oxygen (1L on arrival, room air most of the time with 87-94%) Activity Tolerance: Patient limited by fatigue;Patient limited by pain Patient left: in bed;with nursing/sitter in room;with family/visitor present Nurse Communication: Mobility status PT Visit Diagnosis: Other abnormalities of gait and mobility (R26.89);Muscle weakness (generalized) (M62.81);Repeated falls (R29.6);Other symptoms and signs involving the nervous system (R29.898);Dizziness and giddiness (R42)    Time: 1010-1055 PT Time Calculation (min) (ACUTE ONLY): 45 min   Charges:   PT Evaluation $PT Eval Moderate Complexity: 1 Mod PT Treatments $Gait Training: 8-22 mins $Therapeutic Activity: 8-22 mins        Kreg Shropshire,  DPT 12/10/2020, 1:49 PM

## 2020-12-10 NOTE — Progress Notes (Signed)
SLP Cancellation Note  Patient Details Name: Melody Ochoa MRN: [id number] DOB: 1941/08/14   Cancelled treatment:       Reason Eval/Treat Not Completed: SLP screened, no needs identified, will sign off  Bayard More B. Dreama Saa M.S., CCC-SLP, [facility] Speech-Language Pathologist Rehabilitation Services Office [phone]   Reuel Derby 12/10/2020, 3:33 PM

## 2020-12-10 NOTE — ED Notes (Signed)
Admitting MD messaged this RN regarding interrogation of loop recorder. This RN has not done a loop recorder interrogation, and does not know if the ED pacemaker interrogators would work for a loop recorder. Consulting civil engineer Vickii Penna) shown the message from the MD and stated, "I don't know what she's talking about, can we do that down here?" Charge RN replied stating that she was not familiar with that process either.   Multiple ED RN's asked by this RN if they knew how to do this, none knew if we could interrogate a loop recorder in the ED or if we had the equipment to do so. This RN messaged back the admitting MD, "I can't do that, and I've talked to the charge nurse and she said that's not something we do. I'm assuming cardiology does that?" No response from admitting MD  Later in the shift, as transport team was moving patient to be transported upstairs, the other RN taking care of this pt received a message pertaining to the interrogation of the loop recorder. Admission RN to receive this pt notified of this message. Pt brought to floor

## 2020-12-10 NOTE — ED Notes (Signed)
Pt assisted to restroom, standby assist. NAD noted.

## 2020-12-10 NOTE — Evaluation (Signed)
Occupational Therapy Evaluation Patient Details Name: Melody Ochoa MRN: [id number] DOB: 05-21-1941 Today's Date: 12/10/2020    History of Present Illness 80 y.o. female with medical history significant for history of CVA four years prior treated with tPA, chronic back pain on chronic opiates, ambulant with a cane,, pulmonary fibrosis on home O2 at 2 L, depression, who presents to the emergency room bi EMS following a fall at home.  Patient states that when she awoke several hours earlier she dizzy and her speech was slurred and felt weakness in the right leg.  As she got up she fell onto her right side striking her head, right arm and shoulder. Her husband tried to help her up but she couldn't get up so they called EMS.  The weakness and slurred speech lasted 20 to 30 minutes and then resolved however she does have residual dizziness.  She denied headache or visual disturbance.  Denies palpitations, chest pain, shortness of breath, cough fever or chills.  She arrived to the emergency room more than 4 hours after the symptoms   Clinical Impression   Patient presenting with decreased I in self care, balance, functional mobility/transfer, endurance, and safety awareness.  Patient reports being independent without use of AD but has also had several falls at home PTA. PT reprots slurred speech and R weakness has resolved. Pt with R shoulder pain and unable to perform AROM secondary to pain. No fx on x-ray.  Patient currently functioning at min - mod A overall.Pt lives with husband but unsure if he is able to provide this level of assistance to pt and likely needing higher level of care. Patient will benefit from acute OT to increase overall independence in the areas of ADLs, functional mobility, and safety awareness in order to safely discharge to next venue of care.    Follow Up Recommendations  SNF    Equipment Recommendations  Other (comment) (defer to next venue of care)       Precautions /  Restrictions Precautions Precautions: Fall      Mobility Bed Mobility Overal bed mobility: Needs Assistance Bed Mobility: Rolling;Supine to Sit;Sit to Supine Rolling: Min assist   Supine to sit: Min assist Sit to supine: Mod assist   General bed mobility comments: Pt needing increased assistance as she was unable to utilize R UE for mobility    Transfers Overall transfer level: Needs assistance Equipment used: 1 person hand held assist Transfers: Sit to/from Stand Sit to Stand: Min assist              Balance Overall balance assessment: Needs assistance Sitting-balance support: Feet unsupported;No upper extremity supported Sitting balance-Leahy Scale: Fair     Standing balance support: During functional activity Standing balance-Leahy Scale: Fair                             ADL either performed or assessed with clinical judgement   ADL Overall ADL's : Needs assistance/impaired     Grooming: Wash/dry hands;Wash/dry face;Oral care;Minimal assistance;Sitting           Upper Body Dressing : Moderate assistance;Sitting Upper Body Dressing Details (indicate cue type and reason): donning gown                         Vision Patient Visual Report: No change from baseline              Pertinent Vitals/Pain Pain  Assessment: Faces Faces Pain Scale: Hurts worst Pain Location: R UE Pain Descriptors / Indicators: Aching;Discomfort Pain Intervention(s): Limited activity within patient's tolerance;Repositioned     Hand Dominance     Extremity/Trunk Assessment Upper Extremity Assessment Upper Extremity Assessment: RUE deficits/detail RUE Deficits / Details: unable to actively perform shoulder elevation secondary to pain RUE: Unable to fully assess due to pain   Lower Extremity Assessment Lower Extremity Assessment: Defer to PT evaluation       Communication Communication Communication: No difficulties   Cognition Arousal/Alertness:  Awake/alert Behavior During Therapy: WFL for tasks assessed/performed Overall Cognitive Status: Within Functional Limits for tasks assessed                                                Home Living Family/patient expects to be discharged to:: Private residence Living Arrangements: Spouse/significant other Available Help at Discharge: Family;Available 24 hours/day Type of Home: House Home Access: Stairs to enter [employer] of Steps: 4 Entrance Stairs-Rails: Right;Left;Can reach both Home Layout: One level     Bathroom Shower/Tub: Producer, television/film/video: Standard     Home Equipment: Environmental consultant - 2 wheels;Cane - single point;Bedside commode;Shower seat - built in          Prior Functioning/Environment Level of Independence: Independent with assistive device(s)        Comments: Pt reports she has        OT Problem List: Decreased strength;Decreased activity tolerance;Impaired balance (sitting and/or standing);Decreased safety awareness;Cardiopulmonary status limiting activity;Decreased range of motion;Decreased knowledge of use of DME or AE;Impaired UE functional use      OT Treatment/Interventions: Self-care/ADL training;Therapeutic exercise;Patient/family education;Energy conservation;DME and/or AE instruction;Therapeutic activities;Balance training    OT Goals(Current goals can be found in the care plan section) Acute Rehab OT Goals Patient Stated Goal: "To get all these lines off of me" OT Goal Formulation: With patient Time For Goal Achievement: 12/24/20 Potential to Achieve Goals: Good ADL Goals Pt Will Perform Grooming: with supervision;standing Pt Will Transfer to Toilet: with supervision;bedside commode Pt Will Perform Toileting - Clothing Manipulation and hygiene: with supervision;sit to/from stand  OT Frequency: Min 1X/week    AM-PAC OT "6 Clicks" Daily Activity     Outcome Measure Help from another person eating  meals?: A Little Help from another person taking care of personal grooming?: A Little Help from another person toileting, which includes using toliet, bedpan, or urinal?: A Lot Help from another person bathing (including washing, rinsing, drying)?: A Lot Help from another person to put on and taking off regular upper body clothing?: A Lot Help from another person to put on and taking off regular lower body clothing?: A Lot 6 Click Score: 14   End of Session Equipment Utilized During Treatment: Oxygen (1L) Nurse Communication: Mobility status  Activity Tolerance: Patient tolerated treatment well Patient left: in bed;with call bell/phone within reach  OT Visit Diagnosis: Unsteadiness on feet (R26.81);Repeated falls (R29.6);Muscle weakness (generalized) (M62.81)                Time: 2440-1027 OT Time Calculation (min): 20 min Charges:  OT General Charges $OT Visit: 1 Visit OT Evaluation $OT Eval Low Complexity: 1 Low OT Treatments $Self Care/Home Management : 8-22 mins  Jackquline Denmark, MS, OTR/L , CBIS ascom [phone]  12/10/20, 1:09 PM

## 2020-12-10 NOTE — ED Notes (Signed)
ED TO INPATIENT HANDOFF REPORT  ED Nurse Name and Phone #: Macyn Remmert [phone]  S Name/Age/Gender Melody Ochoa 80 y.o. female Room/Bed: ED33A/ED33A  Code Status   Code Status: Full Code  Home/SNF/Other Home Patient oriented to: self, place, time and situation Is this baseline? Yes   Triage Complete: Triage complete  Chief Complaint Cerebellar stroke, acute ([place]) [I63.9] Ischemic stroke ([place]) [I63.9]  Triage Note Pt in from home via [place] after mechanical fall on rug, take Coumadin. Landed on R side, no LOC. Did hit head, reports R shoulder and humerus pain. VSS.     Allergies Allergies  Allergen Reactions  . Latex Itching  . Aleve [Naproxen Sodium] Other (See Comments)    Gi upset  . Aspirin Other (See Comments)    Stomach pain-aggravates diverticulosis  . Celebrex [Celecoxib] Other (See Comments)    Dizziness   . Effexor [Venlafaxine] Other (See Comments)    Hot flashes   . Gabapentin Other (See Comments)    "Night terrors"  . Hydrocodone-Homatropine Diarrhea  . Ibuprofen Other (See Comments)    Gi upset   . Mobic [Meloxicam]     Stomach upset  . Tape Itching and Other (See Comments)    Itchy blisters  . Vioxx [Rofecoxib] Other (See Comments)    Gi upset  . Other Rash and Other (See Comments)    bandaides  . Tramadol Other (See Comments)    hallucinations    Level of Care/Admitting Diagnosis ED Disposition    ED Disposition Condition Comment   Admit  Hospital Area: [facility] [[id number]]  Level of Care: Med-Surg [16]  Covid Evaluation: Confirmed COVID Negative  Diagnosis: Ischemic stroke [facility]) [id number]  Admitting Physician: Mckinley Jewel [id number]  Attending Physician: Mckinley Jewel [phone]  Estimated length of stay: past midnight tomorrow  Certification:: I certify this patient will need inpatient services for at least 2 midnights       B Medical/Surgery History Past Medical History:  Diagnosis Date  . Arthritis    . Atheromatous plaque 10/18/2017  . Back pain   . Cancer ([place])    skin  . Depression   . Diverticulosis   . Dizziness    patient had episode of dizziness when came in room. hx past no dx  . GERD (gastroesophageal reflux disease)   . Heart murmur   . Hyperlipidemia 10/18/2017  . PONV (postoperative nausea and vomiting)    yrs ago  . Squamous cell carcinoma of skin 11/16/2016   L chest parasternal  . Squamous cell carcinoma of skin 02/05/2020   L cheek inf to lat zygoma  . Stroke [facility])    Past Surgical History:  Procedure Laterality Date  . BACK SURGERY  13  . BREAST BIOPSY Bilateral    cores "years ago"  . BREAST EXCISIONAL BIOPSY Left 90's  . CATARACT EXTRACTION W/PHACO Left 12/20/2018   Procedure: CATARACT EXTRACTION PHACO AND INTRAOCULAR LENS PLACEMENT ([place]) LEFT TOPICAL;  Surgeon: Leandrew Koyanagi, MD;  Location: [facility];  Service: Ophthalmology;  Laterality: Left;  . CATARACT EXTRACTION W/PHACO Right 01/24/2019   Procedure: CATARACT EXTRACTION PHACO AND INTRAOCULAR LENS PLACEMENT ([place]) RIGHT;  Surgeon: Leandrew Koyanagi, MD;  Location: [facility];  Service: Ophthalmology;  Laterality: Right;  . CERVICAL DISC SURGERY  09  . COLONOSCOPY WITH PROPOFOL N/A 01/13/2016   Procedure: COLONOSCOPY WITH PROPOFOL;  Surgeon: [name], MD;  Location: [facility] ENDOSCOPY;  Service: Endoscopy;  Laterality: N/A;  . COLONOSCOPY WITH PROPOFOL N/A  02/19/2020   Procedure: COLONOSCOPY WITH PROPOFOL;  Surgeon: [name], MD;  Location: [facility] ENDOSCOPY;  Service: Gastroenterology;  Laterality: N/A;  . FACELIFT  94  . LOOP RECORDER INSERTION N/A 08/23/2017   Procedure: LOOP RECORDER INSERTION;  Surgeon: Thompson Grayer, MD;  Location: [place] CV LAB;  Service: Cardiovascular;  Laterality: N/A;  . TEE WITHOUT CARDIOVERSION N/A 08/12/2017   Procedure: TRANSESOPHAGEAL ECHOCARDIOGRAM (TEE);  Surgeon: Sueanne Margarita, MD;  Location: [facility] ENDOSCOPY;  Service: Cardiovascular;   Laterality: N/A;  . TONSILLECTOMY    . TOTAL HIP ARTHROPLASTY Right 04/22/2016   Procedure: TOTAL HIP ARTHROPLASTY;  Surgeon: Corky Mull, MD;  Location: [facility] ORS;  Service: Orthopedics;  Laterality: Right;     A IV Location/Drains/Wounds Patient Lines/Drains/Airways Status    Active Line/Drains/Airways    Name Placement date Placement time Site Days   Peripheral IV 12/09/20 Left Antecubital 12/09/20  2148  Antecubital  1   Epidural Catheter 08/21/18 08/21/18  1300  - 842   Sheath 08/08/19 08/08/19  1133  -  490   Incision (Closed) 04/22/16 Hip Right 04/22/16  0911  - 1693   Incision (Closed) 08/23/17 Chest Left;Upper 08/23/17  0805  - 1205   Incision (Closed) 12/20/18 Eye 12/20/18  1023  - 721   Incision (Closed) 01/24/19 Eye 01/24/19  1041  - 686          Intake/Output Last 24 hours No intake or output data in the 24 hours ending 12/10/20 1639  Labs/Imaging Results for orders placed or performed during the hospital encounter of 12/09/20 (from the past 48 hour(s))  CBC with Differential     Status: Abnormal   Collection Time: 12/09/20  1:09 PM  Result Value Ref Range   WBC 7.2 4.0 - 10.5 K/uL   RBC 3.65 (L) 3.87 - 5.11 MIL/uL   Hemoglobin 10.4 (L) 12.0 - 15.0 g/dL   HCT 32.7 (L) 36.0 - 46.0 %   MCV 89.6 80.0 - 100.0 fL   MCH 28.5 26.0 - 34.0 pg   MCHC 31.8 30.0 - 36.0 g/dL   RDW 14.4 11.5 - 15.5 %   Platelets 224 150 - 400 K/uL   nRBC 0.0 0.0 - 0.2 %   Neutrophils Relative % 78 %   Neutro Abs 5.6 1.7 - 7.7 K/uL   Lymphocytes Relative 11 %   Lymphs Abs 0.8 0.7 - 4.0 K/uL   Monocytes Relative 7 %   Monocytes Absolute 0.5 0.1 - 1.0 K/uL   Eosinophils Relative 3 %   Eosinophils Absolute 0.2 0.0 - 0.5 K/uL   Basophils Relative 1 %   Basophils Absolute 0.1 0.0 - 0.1 K/uL   Immature Granulocytes 0 %   Abs Immature Granulocytes 0.03 0.00 - 0.07 K/uL    Comment: Performed at [facility], [address] [place], [place] [id number]  Basic metabolic panel      Status: Abnormal   Collection Time: 12/09/20  1:09 PM  Result Value Ref Range   Sodium 139 135 - 145 mmol/L   Potassium 4.2 3.5 - 5.1 mmol/L   Chloride 104 98 - 111 mmol/L   CO2 27 22 - 32 mmol/L   Glucose, Bld 93 70 - 99 mg/dL    Comment: Glucose reference range applies only to samples taken after fasting for at least 8 hours.   BUN 15 8 - 23 mg/dL   Creatinine, Ser 1.28 (H) 0.44 - 1.00 mg/dL   Calcium 9.1 8.9 - 10.3 mg/dL  GFR, Estimated 42 (L) >60 mL/min    Comment: (NOTE) Calculated using the CKD-EPI Creatinine Equation (2021)    Anion gap 8 5 - 15    Comment: Performed at [facility], [address] [place], [place] [address]  Protime-INR     Status: None   Collection Time: 12/09/20  1:09 PM  Result Value Ref Range   Prothrombin Time 12.4 11.4 - 15.2 seconds   INR 1.0 0.8 - 1.2    Comment: (NOTE) INR goal varies based on device and disease states. Performed at [facility], [place], [place] [address]   SARS CORONAVIRUS 2 (TAT 6-24 HRS) Nasopharyngeal Nasopharyngeal Swab     Status: None   Collection Time: 12/09/20  9:17 PM   Specimen: Nasopharyngeal Swab  Result Value Ref Range   SARS Coronavirus 2 NEGATIVE NEGATIVE    Comment: (NOTE) SARS-CoV-2 target nucleic acids are NOT DETECTED.  The SARS-CoV-2 RNA is generally detectable in upper and lower respiratory specimens during the acute phase of infection. Negative results do not preclude SARS-CoV-2 infection, do not rule out co-infections with other pathogens, and should not be used as the sole basis for treatment or other patient management decisions. Negative results must be combined with clinical observations, patient history, and epidemiological information. The expected result is Negative.  Fact Sheet for Patients: SugarRoll.be  Fact Sheet for Healthcare Providers: https://www.woods-mathews.com/  This test is not yet approved or  cleared by the [place] FDA and  has been authorized for detection and/or diagnosis of SARS-CoV-2 by FDA under an Emergency Use Authorization (EUA). This EUA will remain  in effect (meaning this test can be used) for the duration of the COVID-19 declaration under Se ction 564(b)(1) of the Act, 21 U.S.C. section 360bbb-3(b)(1), unless the authorization is terminated or revoked sooner.  Performed at [facility], [place] [address] [place], [place] [address]   CBC     Status: Abnormal   Collection Time: 12/09/20  9:49 PM  Result Value Ref Range   WBC 6.4 4.0 - 10.5 K/uL   RBC 3.79 (L) 3.87 - 5.11 MIL/uL   Hemoglobin 10.8 (L) 12.0 - 15.0 g/dL   HCT 33.7 (L) 36.0 - 46.0 %   MCV 88.9 80.0 - 100.0 fL   MCH 28.5 26.0 - 34.0 pg   MCHC 32.0 30.0 - 36.0 g/dL   RDW 14.4 11.5 - 15.5 %   Platelets 237 150 - 400 K/uL   nRBC 0.0 0.0 - 0.2 %    Comment: Performed at [facility], [address] [place], [place] [address]  Creatinine, serum     Status: Abnormal   Collection Time: 12/09/20  9:49 PM  Result Value Ref Range   Creatinine, Ser 1.13 (H) 0.44 - 1.00 mg/dL   GFR, Estimated 49 (L) >60 mL/min    Comment: (NOTE) Calculated using the CKD-EPI Creatinine Equation (2021) Performed at [facility], [address] [place], [place] [address]   Hemoglobin A1c     Status: None   Collection Time: 12/10/20  4:59 AM  Result Value Ref Range   Hgb A1c MFr Bld 5.0 4.8 - 5.6 %    Comment: (NOTE) Pre diabetes:          5.7%-6.4%  Diabetes:              >6.4%  Glycemic control for   <7.0% adults with diabetes    Mean Plasma Glucose 96.8 mg/dL  Comment: Performed at [facility], [place] [address], [place], [place] [address]  Lipid panel     Status: None   Collection Time: 12/10/20  4:59 AM  Result Value Ref Range   Cholesterol 118 0 - 200 mg/dL   Triglycerides 70 <150 mg/dL   HDL 57 >40 mg/dL   Total CHOL/HDL Ratio 2.1 RATIO   VLDL 14 0 - 40 mg/dL   LDL  Cholesterol 47 0 - 99 mg/dL    Comment:        Total Cholesterol/HDL:CHD Risk Coronary Heart Disease Risk Table                     Men   Women  1/2 Average Risk   3.4   3.3  Average Risk       5.0   4.4  2 X Average Risk   9.6   7.1  3 X Average Risk  23.4   11.0        Use the calculated Patient Ratio above and the CHD Risk Table to determine the patient's CHD Risk.        ATP III CLASSIFICATION (LDL):  <100     mg/dL   Optimal  100-129  mg/dL   Near or Above                    Optimal  130-159  mg/dL   Borderline  160-189  mg/dL   High  >190     mg/dL   Very High Performed at [facility], [place], [place] [address]    CT ANGIO HEAD W OR WO CONTRAST  Result Date: 12/10/2020 CLINICAL DATA:  Acute right cerebellar infarction. EXAM: CT ANGIOGRAPHY HEAD AND NECK TECHNIQUE: Multidetector CT imaging of the head and neck was performed using the standard protocol during bolus administration of intravenous contrast. Multiplanar CT image reconstructions and MIPs were obtained to evaluate the vascular anatomy. Carotid stenosis measurements (when applicable) are obtained utilizing NASCET criteria, using the distal internal carotid diameter as the denominator. CONTRAST:  80mL OMNIPAQUE IOHEXOL 350 MG/ML SOLN COMPARISON:  MRI yesterday. FINDINGS: CT HEAD FINDINGS Brain: Old left cerebellar infarction. Chronic small-vessel ischemic changes of the hemispheric white matter. Old right frontal and left parietal cortical and subcortical infarctions. No mass lesion, hemorrhage, hydrocephalus or extra-axial collection. Vascular: There is atherosclerotic calcification of the major vessels at the base of the brain. Skull: Negative Sinuses: Clear Orbits: Normal Review of the MIP images confirms the above findings CTA NECK FINDINGS Aortic arch: Aortic atherosclerosis. Branching pattern is normal. 30-50% stenosis of the proximal left subclavian artery. Right carotid system: Common carotid  artery widely patent to the bifurcation. Calcified plaque at the carotid bifurcation but without stenosis. Cervical ICA widely patent. Left carotid system: Common carotid artery widely patent to the bifurcation region. Calcified plaque at the carotid bifurcation and ICA bulb. 20% stenosis of the proximal ICA bulb. More distal cervical ICA widely patent. Vertebral arteries: Both vertebral artery origins are widely patent. Both vertebral arteries are patent through the cervical region to the foramen magnum. Skeleton: Pronounced cervical spondylosis and facet arthropathy. Previous posterior decompression and fusion at C5-6. Other neck: No mass or lymphadenopathy. Upper chest: Chronic lung disease with emphysema, scarring and bronchiectasis. No identifiable acute pneumonia. Review of the MIP images confirms the above findings CTA HEAD FINDINGS Anterior circulation: Both internal carotid arteries are patent through the skull base and siphon regions. There is  siphon atherosclerotic calcification but no stenosis greater than 30%. The anterior and middle cerebral vessels are patent. No large or medium vessel occlusion. Posterior circulation: Both vertebral arteries are patent through the foramen magnum to the basilar. No basilar stenosis. Posterior circulation branch vessels are patent. Primary anterior circulation supply to the posterior cerebral arteries. Venous sinuses: Patent and normal. Anatomic variants: None significant. Review of the MIP images confirms the above findings IMPRESSION: 1. No large or medium vessel occlusion. 2. Aortic atherosclerosis. 30-50% stenosis of the proximal left subclavian artery. 3. Atherosclerotic disease at both carotid bifurcations but without stenosis on the right. 20% stenosis of the proximal ICA bulb on the left. 4. No significant posterior circulation pathology identified. 5. Chronic lung disease with emphysema, scarring and bronchiectasis. No identifiable acute pneumonia. Aortic  Atherosclerosis (ICD10-I70.0) and Emphysema (ICD10-J43.9). Electronically Signed   By: Paulina Fusi M.D.   On: 12/10/2020 15:22   DG Shoulder Right  Result Date: 12/09/2020 CLINICAL DATA:  Right shoulder and upper arm pain after trip and fall over a rug today. Initial encounter. EXAM: RIGHT SHOULDER - 2+ VIEW COMPARISON:  None. FINDINGS: There is no evidence of fracture or dislocation. Mild-to-moderate acromioclavicular and glenohumeral osteoarthritis noted. Soft tissues are unremarkable. IMPRESSION: No acute abnormality. Electronically Signed   By: Drusilla Kanner M.D.   On: 12/09/2020 13:52   CT Head Wo Contrast  Result Date: 12/09/2020 CLINICAL DATA:  Head injury after fall. EXAM: CT HEAD WITHOUT CONTRAST TECHNIQUE: Contiguous axial images were obtained from the base of the skull through the vertex without intravenous contrast. COMPARISON:  September 17, 2020. FINDINGS: Brain: Mild diffuse cortical atrophy is noted. Old right frontal and posterior parietal infarctions are noted. No mass effect or midline shift is noted. Ventricular size is within normal limits. There is no evidence of mass lesion, hemorrhage or acute infarction. Vascular: No hyperdense vessel or unexpected calcification. Skull: Normal. Negative for fracture or focal lesion. Sinuses/Orbits: No acute finding. Other: None. IMPRESSION: Mild diffuse cortical atrophy. Old right frontal and posterior parietal infarctions. No acute intracranial abnormality seen. Electronically Signed   By: Lupita Raider M.D.   On: 12/09/2020 15:37   CT ANGIO NECK W OR WO CONTRAST  Result Date: 12/10/2020 CLINICAL DATA:  Acute right cerebellar infarction. EXAM: CT ANGIOGRAPHY HEAD AND NECK TECHNIQUE: Multidetector CT imaging of the head and neck was performed using the standard protocol during bolus administration of intravenous contrast. Multiplanar CT image reconstructions and MIPs were obtained to evaluate the vascular anatomy. Carotid stenosis measurements  (when applicable) are obtained utilizing NASCET criteria, using the distal internal carotid diameter as the denominator. CONTRAST:  22mL OMNIPAQUE IOHEXOL 350 MG/ML SOLN COMPARISON:  MRI yesterday. FINDINGS: CT HEAD FINDINGS Brain: Old left cerebellar infarction. Chronic small-vessel ischemic changes of the hemispheric white matter. Old right frontal and left parietal cortical and subcortical infarctions. No mass lesion, hemorrhage, hydrocephalus or extra-axial collection. Vascular: There is atherosclerotic calcification of the major vessels at the base of the brain. Skull: Negative Sinuses: Clear Orbits: Normal Review of the MIP images confirms the above findings CTA NECK FINDINGS Aortic arch: Aortic atherosclerosis. Branching pattern is normal. 30-50% stenosis of the proximal left subclavian artery. Right carotid system: Common carotid artery widely patent to the bifurcation. Calcified plaque at the carotid bifurcation but without stenosis. Cervical ICA widely patent. Left carotid system: Common carotid artery widely patent to the bifurcation region. Calcified plaque at the carotid bifurcation and ICA bulb. 20% stenosis of the proximal ICA bulb. More  distal cervical ICA widely patent. Vertebral arteries: Both vertebral artery origins are widely patent. Both vertebral arteries are patent through the cervical region to the foramen magnum. Skeleton: Pronounced cervical spondylosis and facet arthropathy. Previous posterior decompression and fusion at C5-6. Other neck: No mass or lymphadenopathy. Upper chest: Chronic lung disease with emphysema, scarring and bronchiectasis. No identifiable acute pneumonia. Review of the MIP images confirms the above findings CTA HEAD FINDINGS Anterior circulation: Both internal carotid arteries are patent through the skull base and siphon regions. There is siphon atherosclerotic calcification but no stenosis greater than 30%. The anterior and middle cerebral vessels are patent. No  large or medium vessel occlusion. Posterior circulation: Both vertebral arteries are patent through the foramen magnum to the basilar. No basilar stenosis. Posterior circulation branch vessels are patent. Primary anterior circulation supply to the posterior cerebral arteries. Venous sinuses: Patent and normal. Anatomic variants: None significant. Review of the MIP images confirms the above findings IMPRESSION: 1. No large or medium vessel occlusion. 2. Aortic atherosclerosis. 30-50% stenosis of the proximal left subclavian artery. 3. Atherosclerotic disease at both carotid bifurcations but without stenosis on the right. 20% stenosis of the proximal ICA bulb on the left. 4. No significant posterior circulation pathology identified. 5. Chronic lung disease with emphysema, scarring and bronchiectasis. No identifiable acute pneumonia. Aortic Atherosclerosis (ICD10-I70.0) and Emphysema (ICD10-J43.9). Electronically Signed   By: Nelson Chimes M.D.   On: 12/10/2020 15:22   CT Cervical Spine Wo Contrast  Result Date: 12/09/2020 CLINICAL DATA:  80 year old female with neck trauma. EXAM: CT CERVICAL SPINE WITHOUT CONTRAST TECHNIQUE: Multidetector CT imaging of the cervical spine was performed without intravenous contrast. Multiplanar CT image reconstructions were also generated. COMPARISON:  Cervical spine CT dated 09/17/2020. FINDINGS: Alignment: No acute subluxation. Skull base and vertebrae: No acute fracture. C5-C6 decompression and fusion. Soft tissues and spinal canal: No prevertebral fluid or swelling. No visible canal hematoma. Disc levels:  Multilevel degenerative changes. Upper chest: Biapical linear scarring. Other: None IMPRESSION: 1. No acute/traumatic cervical spine pathology. 2. C5-C6 decompression and fusion. Electronically Signed   By: Anner Crete M.D.   On: 12/09/2020 17:43   MR BRAIN WO CONTRAST  Result Date: 12/09/2020 CLINICAL DATA:  TIA EXAM: MRI HEAD WITHOUT CONTRAST TECHNIQUE: Multiplanar,  multiecho pulse sequences of the brain and surrounding structures were obtained without intravenous contrast. COMPARISON:  09/30/2019 FINDINGS: Brain: Small subcentimeter focus of restricted diffusion is present in the right cerebellum. Multiple chronic infarcts are again identified including involvement of the right frontal lobe, left occipitoparietal lobe, and left cerebellum. There are chronic blood products associated with the areas of chronic infarction. Additional focus of susceptibility in the inferior right parietal lobe likely reflects a chronic microhemorrhage. Other patchy T2 hyperintensity in the supratentorial white matter is nonspecific but most likely reflects stable mild chronic microvascular ischemic changes. Prominence of the ventricles and sulci reflects stable parenchymal volume loss. There is no intracranial mass or mass effect. There is no hydrocephalus or extra-axial fluid collection. Vascular: Major vessel flow voids at the skull base are preserved. Skull and upper cervical spine: Normal marrow signal is preserved. Sinuses/Orbits: Paranasal sinuses are aerated. Bilateral lens replacements. Other: Sella is unremarkable.  Mastoid air cells are clear. IMPRESSION: Small acute right cerebellar infarct. Multiple chronic infarcts and chronic microvascular ischemic changes as seen previously. Electronically Signed   By: Macy Mis M.D.   On: 12/09/2020 20:09   US Carotid Bilateral (at [facility] and [facility] only)  Result Date: 12/10/2020 CLINICAL DATA:  Stroke workup EXAM: BILATERAL CAROTID DUPLEX ULTRASOUND TECHNIQUE: Pearline Cables scale imaging, color Doppler and duplex ultrasound were performed of bilateral carotid and vertebral arteries in the neck. COMPARISON:  06/13/2019 FINDINGS: Criteria: Quantification of carotid stenosis is based on velocity parameters that correlate the residual internal carotid diameter with NASCET-based stenosis levels, using the diameter of the distal internal carotid lumen as the  denominator for stenosis measurement. The following velocity measurements were obtained: RIGHT ICA: 69/20 cm/sec CCA: [id number] cm/sec SYSTOLIC ICA/CCA RATIO:  0.9 ECA: 61 cm/sec LEFT ICA: 84/23 cm/sec CCA: [id number] cm/sec SYSTOLIC ICA/CCA RATIO:  1.3 ECA: 92 cm/sec RIGHT CAROTID ARTERY: Mild atheromatous plaque seen at the bifurcation. RIGHT VERTEBRAL ARTERY:  Antegrade flow with normal waveform LEFT CAROTID ARTERY: Mild atheromatous plaque seen at the bifurcation. LEFT VERTEBRAL ARTERY:  Antegrade flow with normal waveform IMPRESSION: 1. Mild carotid atherosclerosis at the bifurcations without signs of stenosis. 2. Patent vertebral arteries with normal flow. Electronically Signed   By: Monte Fantasia M.D.   On: 12/10/2020 06:50   DG Humerus Right  Result Date: 12/09/2020 CLINICAL DATA:  Right shoulder and upper arm pain after trip and fall over a rug today. Initial encounter. EXAM: RIGHT HUMERUS - 2+ VIEW COMPARISON:  None. FINDINGS: There is no evidence of fracture or other focal bone lesions. Soft tissues are unremarkable. IMPRESSION: Negative exam. Electronically Signed   By: Inge Rise M.D.   On: 12/09/2020 13:52    Pending Labs Unresulted Labs (From admission, onward)          Start     Ordered   12/16/20 0500  Creatinine, serum  (enoxaparin (LOVENOX)    CrCl >/= 30 ml/min)  Weekly,   STAT     Comments: while on enoxaparin therapy    12/09/20 2055   12/11/20 0500  CBC  Tomorrow morning,   STAT        12/10/20 1229   12/11/20 [id number]  Basic metabolic panel  Tomorrow morning,   STAT        12/10/20 1229          Vitals/Pain Today's Vitals   12/10/20 1445 12/10/20 1530 12/10/20 1600 12/10/20 1630  BP:      Pulse: 79 76 78 74  Resp: 20 (!) 23 (!) 22 20  Temp:      TempSrc:      SpO2: 98% 100% 100% 100%  Weight:        Isolation Precautions No active isolations  Medications Medications  atorvastatin (LIPITOR) tablet 80 mg (has no administration in time range)  clopidogrel  (PLAVIX) tablet 75 mg (75 mg Oral Given 12/10/20 0846)   stroke: mapping our early stages of recovery book (0 each Does not apply Hold 12/10/20 0835)  acetaminophen (TYLENOL) tablet 650 mg (has no administration in time range)    Or  acetaminophen (TYLENOL) 160 MG/5ML solution 650 mg (has no administration in time range)    Or  acetaminophen (TYLENOL) suppository 650 mg (has no administration in time range)  senna-docusate (Senokot-S) tablet 1 tablet (has no administration in time range)  enoxaparin (LOVENOX) injection 40 mg (40 mg Subcutaneous Given 12/10/20 0845)  aspirin EC tablet 81 mg (81 mg Oral Given 12/10/20 0846)  albuterol (PROVENTIL) (2.5 MG/3ML) 0.083% nebulizer solution 2.5 mg (2.5 mg Nebulization Given 12/09/20 1758)  iohexol (OMNIPAQUE) 350 MG/ML injection 75 mL (75 mLs Intravenous Contrast Given 12/10/20 1458)    Mobility walks High fall risk   Focused Assessments Neuro Assessment Handoff:  Swallow  screen pass? Yes  Cardiac Rhythm: Normal sinus rhythm NIH Stroke Scale ( + Modified Stroke Scale Criteria)  Interval: Shift assessment Level of Consciousness (1a.)   : Alert, keenly responsive LOC Questions (1b. )   +: Answers both questions correctly LOC Commands (1c. )   + : Performs both tasks correctly Best Gaze (2. )  +: Normal Visual (3. )  +: No visual loss Facial Palsy (4. )    : Normal symmetrical movements Motor Arm, Left (5a. )   +: No drift Motor Arm, Right (5b. )   +: No drift Motor Leg, Left (6a. )   +: No drift Motor Leg, Right (6b. )   +: No drift Limb Ataxia (7. ): Absent Sensory (8. )   +: Normal, no sensory loss Best Language (9. )   +: No aphasia Dysarthria (10. ): Normal Extinction/Inattention (11.)   +: No Abnormality Modified SS Total  +: 0 Complete NIHSS TOTAL: 0     Neuro Assessment:   Neuro Checks:   Shift assessment (12/09/20 2347)  Last Documented NIHSS Modified Score: 0 (12/10/20 1537) Has TPA been given? No If patient is a Neuro Trauma and  patient is going to OR before floor call report to [place] nurse: [phone] or [phone]     R Recommendations: See Admitting Provider Note  Report given to:   Additional Notes:

## 2020-12-10 NOTE — Consult Note (Addendum)
Neurology Consult H&P  CC: stroke  History is obtained from: patient and chart  HPI: Melody Ochoa is a 80 y.o. female PMHx reviewed below and strokes 08/2017 and 06/2019 woke on 12/09/2020 and did not feel right, foggy headed had noticed unsteadiness tending to fall the the right and she lost her footing on a rug and fell onto her right side. When she got up she fell to the right hurting her right shoulder and possibly her head without LOC. Symptoms may have lasted 20 - 30 minutes and completely resolved.    Other pertinent history back pain on chronic opiates, ambulantes with a cane, pulmonary fibrosis (home O2 @ 2L), depression.  Denied headache or visual disturbance/hearing changes.   Denies palpitations, chest pain, cough fever or chills.    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  LKW: unclear - wakeup tpa given?: No, resolution of symptoms IR Thrombectomy? No, LVO Modified Rankin Scale: 0-Completely asymptomatic and back to baseline post- stroke NIHSS: 0  ROS: A complete ROS was performed and is negative except as noted in the HPI.   Past Medical History:  Diagnosis Date  . Arthritis   . Atheromatous plaque 10/18/2017  . Back pain   . Cancer ([place])    skin  . Depression   . Diverticulosis   . Dizziness    patient had episode of dizziness when came in room. hx past no dx  . GERD (gastroesophageal reflux disease)   . Heart murmur   . Hyperlipidemia 10/18/2017  . PONV (postoperative nausea and vomiting)    yrs ago  . Squamous cell carcinoma of skin 11/16/2016   L chest parasternal  . Squamous cell carcinoma of skin 02/05/2020   L cheek inf to lat zygoma  . Stroke [facility])      Family History  Problem Relation Age of Onset  . Lung cancer Father   . Aneurysm Brother   . Stroke Brother   . Diabetes Maternal Grandmother   . Kidney disease Maternal Grandfather   . Cushing syndrome Paternal Grandmother   . Dementia Paternal  Grandfather   . Breast cancer Maternal Aunt 52    Social History:  reports that she quit smoking about 24 years ago. Her smoking use included cigarettes. She has a 30.00 pack-year smoking history. She has never used smokeless tobacco. She reports current alcohol use. She reports that she does not use drugs.   Prior to Admission medications   Medication Sig Start Date End Date Taking? Authorizing Provider  albuterol (VENTOLIN HFA) 108 (90 Base) MCG/ACT inhaler TAKE 2 PUFFS BY MOUTH EVERY 6 HOURS AS NEEDED FOR WHEEZE OR SHORTNESS OF BREATH 10/14/20   Tyler Pita, MD  atorvastatin (LIPITOR) 80 MG tablet TAKE 1 TABLET BY MOUTH DAILY AT 6 PM. NEEDS OFFICE VISIT FOR FURTHER REFILLS. 2ND ATTEMPT 05/27/20   Deboraha Sprang, MD  buPROPion (WELLBUTRIN XL) 300 MG 24 hr tablet Take 300 mg by mouth daily.  07/15/17   [provider]  clopidogrel (PLAVIX) 75 MG tablet TAKE 1 TABLET BY MOUTH EVERY DAY 06/16/20   Deboraha Sprang, MD  DULoxetine (CYMBALTA) 30 MG capsule Take 30 mg by mouth daily. 08/31/20   [provider]  famotidine (PEPCID) 20 MG tablet Take 20 mg by mouth at bedtime.  11/07/18   [provider]  fluticasone (FLONASE) 50 MCG/ACT nasal spray SPRAY 2 SPRAYS INTO EACH NOSTRIL EVERY DAY Patient not  taking: Reported on 10/14/2020 09/09/20   Allegra Grana, FNP  furosemide (LASIX) 20 MG tablet Take 1 tablet (20 mg) by mouth once daily x 3 days, then take 1 tablet (20 mg) once daily as needed for swelling Patient not taking: Reported on 09/24/2020 06/10/20   Duke Salvia, MD  mupirocin ointment (BACTROBAN) 2 % APPLY A SMALL AMOUNT TO AFFECTED AREA ONCE A DAY TO EXCISION SITE Patient not taking: Reported on 10/14/2020 02/05/20   [provider]  pregabalin (LYRICA) 50 MG capsule 50 mg during day, 100 mg at night 09/15/20   Edward Jolly, MD    Exam: Current vital signs: BP (!) 156/77   Pulse 77   Temp 98.6 F (37 C) (Oral)   Resp 16   Wt 90.7 kg   SpO2  93%   BMI 33.28 kg/m   Physical Exam  Constitutional: Appears well-developed and well-nourished.  Psych: Affect appropriate to situation Eyes: No scleral injection HENT: No OP obstrucion Head: Normocephalic.  Cardiovascular: Normal rate and regular rhythm.  Respiratory: Effort normal and breath sounds normal to anterior ascultation GI: Soft.  No distension. There is no tenderness.  Skin: WDI  Neuro: Mental Status: Patient is awake, alert, oriented to person, place, month, year, and situation. Patient is able to give a clear and coherent history. No signs of aphasia or neglect. Cranial Nerves: II: Visual Fields are full. Pupils are equal, round, and reactive to light. III,IV, VI: EOMI without ptosis or diploplia.  V: Facial sensation is symmetric to temperature VII: Facial movement is symmetric.  VIII: hearing is intact to voice X: Uvula elevates symmetrically XI: Shoulder shrug is symmetric. XII: tongue is midline without atrophy or fasciculations.  Motor: Tone is normal. Bulk is normal. 5/5 strength was present in all four extremities. Sensory: Sensation is symmetric to light touch and temperature in the arms and legs. Deep Tendon Reflexes: 2+ and symmetric in the biceps and patellae. Plantars: Toes are downgoing bilaterally. Cerebellar: FNF and HKS are intact bilaterally.  I have reviewed labs in epic and the pertinent results are: Results for Melody, Ochoa (MRN [id number]) as of 12/10/2020 10:25  Ref. Range 12/10/2020 04:59  Total CHOL/HDL Ratio Latest Units: RATIO 2.1  Cholesterol Latest Ref Range: 0 - 200 mg/dL 595  HDL Cholesterol Latest Ref Range: >40 mg/dL 57  LDL (calc) Latest Ref Range: 0 - 99 mg/dL 47  Triglycerides Latest Ref Range: <150 mg/dL 70  VLDL Latest Ref Range: 0 - 40 mg/dL 14    I have reviewed the images obtained: NCT head showed did not show acute ischemic changes. MRI brain showed small acute right cerebellar infarct. Chronic infarcts in right  frontal lobe, left occipital-parietal and left cerebellum.  Assessment: Melody Ochoa is a 80 y.o. female right handed PMHx chronic strokes in multiple territories with acute right cerebellar stroke. She does not have significant vascular risk factors and pattern of lesions on MRI suggests embolic from central source; however, to date cardiac workup has been negative. She has an ILR and would benefit from interrogation.  Impression:  Acute ischemic stroke right cerebellum. Chronic ischemic strokes in multiple vascular territories. Chronic antiplatelet therapy - clopidogrel Implantable loop recorder.   Plan: - Please interrogate patient's loop recorder. - Recommend vascular imaging with MRA head and neck/CTA head and neck. - Recommend TTE. - Recommend labs: HbA1c, TSH if not already ordered. - Aspirin 81mg   daily for 3 weeks. - Continue clopidogrel 75mg . - Permissive hypertension first 24  h < 220/110 - currently normotensive.  - Telemetry monitoring for arrhythmia. - Recommend bedside Swallow screen. - Recommend Stroke education. - Recommend PT/OT/SLP consult. - Will continue to follow  Electronically signed by: Dr. Lynnae Sandhoff Pager: [phone] 12/10/2020, 9:05 AM

## 2020-12-10 NOTE — ED Notes (Signed)
This RN asked multiple RN for resources interrogating loop recorder, was informed to consult provider d/t lack of resources/equipment interrogating loop recorder.

## 2020-12-10 NOTE — Progress Notes (Signed)
PROGRESS NOTE    Melody Ochoa  [name] DOB: 18-Jan-1941 DOA: 12/09/2020 PCP: Burnard Hawthorne, FNP   Brief Narrative:  Melody Ochoa is a 80 y.o. female with medical history significant for history of CVA four years prior treated with tPA, chronic back pain on chronic opiates, ambulant with a cane,, pulmonary fibrosis on home O2 at 2 L, depression, who presents to the emergency room bi EMS following a fall at home.  Patient states that when she awoke several hours earlier she dizzy and her speech was slurred and felt weakness in the right leg.  As she got up she fell onto her right side striking her head, right arm and shoulder. Her husband tried to help her up but she couldn't get up so they called EMS.  The weakness and slurred speech lasted 20 to 30 minutes and then resolved however she does have residual dizziness.  She denied headache or visual disturbance.  Denies palpitations, chest pain, shortness of breath, cough fever or chills.  She arrived to the emergency room more than 4 hours after the symptoms ED Course: On arrival, BP 188/91, pulse 80, O2 sat 92% on room air.  Afebrile.  She had an extensive trauma work-up to include CT scan of the head, C-spine as well as right shoulder and humerus which showed no acute injury.  Her blood work was unremarkable.  Creatinine 1.28 which is near her baseline of 1.18.  Hemoglobin 10.4 close to the baseline of 11.7.  The emergency room provider spoke with neurologist, Dr. Theda Sers who recommended getting an MRI with discharge if negative and admission if positive.  MRI was subsequently done which revealed "Small acute right cerebellar infarct.Multiple chronic infarcts and chronic microvascular ischemic changes as seen previously".  Assessment & Plan:  Acute cerebellar stroke: -History of CVA,  -Patient presented with an episode of weakness, dizziness resulting in a fall at home with no acute injury on imaging studies.  -Reviewed MRI brain,  CT head and CT cervical spine.  - Episode lasted for 30 minutes with no residual focal neurological deficits. -Patient arrived outside for TPA window. -Carotid ultrasound shows mild carotid atherosclerosis at the bifurcation without signs of restenosis.  Patent vertebral arteries with normal flow. -Continue aspirin, atorvastatin and Plavix. -Echo from 08/2020 showed ejection fraction of 50 to 55% with grade 1 diastolic dysfunction. -A1c: 5.0%, lipid panel: LDL: 47, total cholesterol: 118. -Await neurology's recommendations -Allow permissive hypertension -Consult PT/OT/SLP  Right arm/right shoulder pain: Status post fall at home. -X-ray of right shoulder and arm: Negative for acute findings. -Continue as needed pain medications.  Continue PT/OT.  Hypertension: Blood pressure is slightly elevated.  Continue to hold home antihypertensive for permissive hypertension.  Chronic anemia: H&H is currently stable.  Continue to monitor.  Pulmonary fibrosis/chronic respiratory failure with hypoxia: Patient uses oxygen-2 L at baseline at home. -Continue albuterol as needed.  CKD stage IIIa: -Kidney function is improving.  Continue to monitor.  Asymptomatic bacteriuria: Patient's urine is positive for rare bacteria.  Patient denies urinary symptoms.  No indication of antibiotics at this time.  She is afebrile with no leukocytosis.  Obesity with BMI of 33: Diet modification/exercise and weight loss recommended.  Long-term current use of opiate analgesic for chronic pain: Continue/resume home analgesics.  DVT prophylaxis: Lovenox Code Status: Full code-confirmed with the patient Family Communication:  None present at bedside.  Plan of care discussed with patient in length and she verbalized understanding and agreed with it. Disposition Plan:  To be determined  Consultants:   Neurology  Procedures:   CT head  CT cervical spine  MRI brain  Carotid Doppler  Antimicrobials:    None  Status is: Inpatient   Dispo: The patient is from: Home              Anticipated d/c is to: SNF              Anticipated d/c date is: 1 day              Patient currently is not medically stable to d/c.         Subjective: Patient seen and examined in the ED.  Tells me that she feels much better.  Denies numbness weakness tingling sensation in hands or feet.  Denies slurred speech, facial drop, headache, dizziness, lightheadedness, chest pain or shortness of breath.  Continues to have right shoulder pain.  She denies fever or chills.  Objective: Vitals:   12/10/20 1045 12/10/20 1100 12/10/20 1130 12/10/20 1200  BP: (!) 94/56 137/68  (!) 159/73  Pulse: 91 80 75 74  Resp:  14 18 (!) 21  Temp:      TempSrc:      SpO2: 95% 96% 98% 98%  Weight:       No intake or output data in the 24 hours ending 12/10/20 1216 Filed Weights   12/10/20 0009  Weight: 90.7 kg    Examination:  General exam: Appears calm and comfortable, elderly, on room air, communicating well Respiratory system: Clear to auscultation. Respiratory effort normal. Cardiovascular system: S1 & S2 heard, RRR. No JVD, murmurs, rubs, gallops or clicks. No pedal edema. Gastrointestinal system: Abdomen is nondistended, soft and nontender. No organomegaly or masses felt. Normal bowel sounds heard. Central nervous system: Alert and oriented. No focal neurological deficits. Extremities: Symmetric 5 x 5 power. Skin: No rashes, lesions or ulcers Psychiatry: Judgement and insight appear normal. Mood & affect appropriate.    Data Reviewed: I have personally reviewed following labs and imaging studies  CBC: Recent Labs  Lab 12/09/20 1309 12/09/20 2149  WBC 7.2 6.4  NEUTROABS 5.6  --   HGB 10.4* 10.8*  HCT 32.7* 33.7*  MCV 89.6 88.9  PLT 224 237   Basic Metabolic Panel: Recent Labs  Lab 12/09/20 1309 12/09/20 2149  NA 139  --   K 4.2  --   CL 104  --   CO2 27  --   GLUCOSE 93  --   BUN 15  --    CREATININE 1.28* 1.13*  CALCIUM 9.1  --    GFR: Estimated Creatinine Clearance: 44.2 mL/min (A) (by C-G formula based on SCr of 1.13 mg/dL (H)). Liver Function Tests: No results for input(s): AST, ALT, ALKPHOS, BILITOT, PROT, ALBUMIN in the last 168 hours. No results for input(s): LIPASE, AMYLASE in the last 168 hours. No results for input(s): AMMONIA in the last 168 hours. Coagulation Profile: Recent Labs  Lab 12/09/20 1309  INR 1.0   Cardiac Enzymes: No results for input(s): CKTOTAL, CKMB, CKMBINDEX, TROPONINI in the last 168 hours. BNP (last 3 results) No results for input(s): PROBNP in the last 8760 hours. HbA1C: Recent Labs    12/10/20 0459  HGBA1C 5.0   CBG: No results for input(s): GLUCAP in the last 168 hours. Lipid Profile: Recent Labs    12/10/20 0459  CHOL 118  HDL 57  LDLCALC 47  TRIG 70  CHOLHDL 2.1   Thyroid Function Tests: No results  for input(s): TSH, T4TOTAL, FREET4, T3FREE, THYROIDAB in the last 72 hours. Anemia Panel: No results for input(s): VITAMINB12, FOLATE, FERRITIN, TIBC, IRON, RETICCTPCT in the last 72 hours. Sepsis Labs: No results for input(s): PROCALCITON, LATICACIDVEN in the last 168 hours.  Recent Results (from the past 240 hour(s))  SARS CORONAVIRUS 2 (TAT 6-24 HRS) Nasopharyngeal Nasopharyngeal Swab     Status: None   Collection Time: 12/09/20  9:17 PM   Specimen: Nasopharyngeal Swab  Result Value Ref Range Status   SARS Coronavirus 2 NEGATIVE NEGATIVE Final    Comment: (NOTE) SARS-CoV-2 target nucleic acids are NOT DETECTED.  The SARS-CoV-2 RNA is generally detectable in upper and lower respiratory specimens during the acute phase of infection. Negative results do not preclude SARS-CoV-2 infection, do not rule out co-infections with other pathogens, and should not be used as the sole basis for treatment or other patient management decisions. Negative results must be combined with clinical observations, patient history, and  epidemiological information. The expected result is Negative.  Fact Sheet for Patients: SugarRoll.be  Fact Sheet for Healthcare Providers: https://www.woods-mathews.com/  This test is not yet approved or cleared by the [place] FDA and  has been authorized for detection and/or diagnosis of SARS-CoV-2 by FDA under an Emergency Use Authorization (EUA). This EUA will remain  in effect (meaning this test can be used) for the duration of the COVID-19 declaration under Se ction 564(b)(1) of the Act, 21 U.S.C. section 360bbb-3(b)(1), unless the authorization is terminated or revoked sooner.  Performed at [facility], [place] [address], [place], [place] [address]       Radiology Studies: DG Shoulder Right  Result Date: 12/09/2020 CLINICAL DATA:  Right shoulder and upper arm pain after trip and fall over a rug today. Initial encounter. EXAM: RIGHT SHOULDER - 2+ VIEW COMPARISON:  None. FINDINGS: There is no evidence of fracture or dislocation. Mild-to-moderate acromioclavicular and glenohumeral osteoarthritis noted. Soft tissues are unremarkable. IMPRESSION: No acute abnormality. Electronically Signed   By: Inge Rise M.D.   On: 12/09/2020 13:52   CT Head Wo Contrast  Result Date: 12/09/2020 CLINICAL DATA:  Head injury after fall. EXAM: CT HEAD WITHOUT CONTRAST TECHNIQUE: Contiguous axial images were obtained from the base of the skull through the vertex without intravenous contrast. COMPARISON:  September 17, 2020. FINDINGS: Brain: Mild diffuse cortical atrophy is noted. Old right frontal and posterior parietal infarctions are noted. No mass effect or midline shift is noted. Ventricular size is within normal limits. There is no evidence of mass lesion, hemorrhage or acute infarction. Vascular: No hyperdense vessel or unexpected calcification. Skull: Normal. Negative for fracture or focal lesion. Sinuses/Orbits: No acute finding. Other: None.  IMPRESSION: Mild diffuse cortical atrophy. Old right frontal and posterior parietal infarctions. No acute intracranial abnormality seen. Electronically Signed   By: Marijo Conception M.D.   On: 12/09/2020 15:37   CT Cervical Spine Wo Contrast  Result Date: 12/09/2020 CLINICAL DATA:  80 year old female with neck trauma. EXAM: CT CERVICAL SPINE WITHOUT CONTRAST TECHNIQUE: Multidetector CT imaging of the cervical spine was performed without intravenous contrast. Multiplanar CT image reconstructions were also generated. COMPARISON:  Cervical spine CT dated 09/17/2020. FINDINGS: Alignment: No acute subluxation. Skull base and vertebrae: No acute fracture. C5-C6 decompression and fusion. Soft tissues and spinal canal: No prevertebral fluid or swelling. No visible canal hematoma. Disc levels:  Multilevel degenerative changes. Upper chest: Biapical linear scarring. Other: None IMPRESSION: 1. No acute/traumatic cervical spine pathology. 2. C5-C6 decompression and  fusion. Electronically Signed   By: Anner Crete M.D.   On: 12/09/2020 17:43   MR BRAIN WO CONTRAST  Result Date: 12/09/2020 CLINICAL DATA:  TIA EXAM: MRI HEAD WITHOUT CONTRAST TECHNIQUE: Multiplanar, multiecho pulse sequences of the brain and surrounding structures were obtained without intravenous contrast. COMPARISON:  09/30/2019 FINDINGS: Brain: Small subcentimeter focus of restricted diffusion is present in the right cerebellum. Multiple chronic infarcts are again identified including involvement of the right frontal lobe, left occipitoparietal lobe, and left cerebellum. There are chronic blood products associated with the areas of chronic infarction. Additional focus of susceptibility in the inferior right parietal lobe likely reflects a chronic microhemorrhage. Other patchy T2 hyperintensity in the supratentorial white matter is nonspecific but most likely reflects stable mild chronic microvascular ischemic changes. Prominence of the ventricles and  sulci reflects stable parenchymal volume loss. There is no intracranial mass or mass effect. There is no hydrocephalus or extra-axial fluid collection. Vascular: Major vessel flow voids at the skull base are preserved. Skull and upper cervical spine: Normal marrow signal is preserved. Sinuses/Orbits: Paranasal sinuses are aerated. Bilateral lens replacements. Other: Sella is unremarkable.  Mastoid air cells are clear. IMPRESSION: Small acute right cerebellar infarct. Multiple chronic infarcts and chronic microvascular ischemic changes as seen previously. Electronically Signed   By: Macy Mis M.D.   On: 12/09/2020 20:09   US Carotid Bilateral (at [facility] and [facility] only)  Result Date: 12/10/2020 CLINICAL DATA:  Stroke workup EXAM: BILATERAL CAROTID DUPLEX ULTRASOUND TECHNIQUE: Pearline Cables scale imaging, color Doppler and duplex ultrasound were performed of bilateral carotid and vertebral arteries in the neck. COMPARISON:  06/13/2019 FINDINGS: Criteria: Quantification of carotid stenosis is based on velocity parameters that correlate the residual internal carotid diameter with NASCET-based stenosis levels, using the diameter of the distal internal carotid lumen as the denominator for stenosis measurement. The following velocity measurements were obtained: RIGHT ICA: 69/20 cm/sec CCA: [id number] cm/sec SYSTOLIC ICA/CCA RATIO:  0.9 ECA: 61 cm/sec LEFT ICA: 84/23 cm/sec CCA: [id number] cm/sec SYSTOLIC ICA/CCA RATIO:  1.3 ECA: 92 cm/sec RIGHT CAROTID ARTERY: Mild atheromatous plaque seen at the bifurcation. RIGHT VERTEBRAL ARTERY:  Antegrade flow with normal waveform LEFT CAROTID ARTERY: Mild atheromatous plaque seen at the bifurcation. LEFT VERTEBRAL ARTERY:  Antegrade flow with normal waveform IMPRESSION: 1. Mild carotid atherosclerosis at the bifurcations without signs of stenosis. 2. Patent vertebral arteries with normal flow. Electronically Signed   By: Monte Fantasia M.D.   On: 12/10/2020 06:50   DG Humerus Right  Result Date:  12/09/2020 CLINICAL DATA:  Right shoulder and upper arm pain after trip and fall over a rug today. Initial encounter. EXAM: RIGHT HUMERUS - 2+ VIEW COMPARISON:  None. FINDINGS: There is no evidence of fracture or other focal bone lesions. Soft tissues are unremarkable. IMPRESSION: Negative exam. Electronically Signed   By: Inge Rise M.D.   On: 12/09/2020 13:52    Scheduled Meds: .  stroke: mapping our early stages of recovery book   Does not apply Once  . aspirin EC  81 mg Oral Daily  . atorvastatin  80 mg Oral q1800  . clopidogrel  75 mg Oral Daily  . enoxaparin (LOVENOX) injection  40 mg Subcutaneous Q24H   Continuous Infusions:   LOS: 0 days   Time spent: 35 minutes   Yaretzy Olazabal Loann Quill, MD [facility]  If 7PM-7AM, please contact night-coverage [email or web address] 12/10/2020, 12:16 PM

## 2020-12-10 NOTE — Progress Notes (Signed)
PROGRESS NOTE    Melody Ochoa  [id number] DOB: 06-14-1941 DOA: 12/09/2020 PCP: Burnard Hawthorne, FNP   Brief Narrative:  Melody Ochoa is a 80 y.o. female with medical history significant for history of CVA four years prior treated with tPA, chronic back pain on chronic opiates, ambulant with a cane,, pulmonary fibrosis on home O2 at 2 L, depression, who presents to the emergency room bi EMS following a fall at home.  Patient states that when she awoke several hours earlier she dizzy and her speech was slurred and felt weakness in the right leg.  As she got up she fell onto her right side striking her head, right arm and shoulder. Her husband tried to help her up but she couldn't get up so they called EMS.  The weakness and slurred speech lasted 20 to 30 minutes and then resolved however she does have residual dizziness.  She denied headache or visual disturbance.  Denies palpitations, chest pain, shortness of breath, cough fever or chills.  She arrived to the emergency room more than 4 hours after the symptoms ED Course: On arrival, BP 188/91, pulse 80, O2 sat 92% on room air.  Afebrile.  She had an extensive trauma work-up to include CT scan of the head, C-spine as well as right shoulder and humerus which showed no acute injury.  Her blood work was unremarkable.  Creatinine 1.28 which is near her baseline of 1.18.  Hemoglobin 10.4 close to the baseline of 11.7.  The emergency room provider spoke with neurologist, Dr. Theda Sers who recommended getting an MRI with discharge if negative and admission if positive.  MRI was subsequently done which revealed "Small acute right cerebellar infarct.Multiple chronic infarcts and chronic microvascular ischemic changes as seen previously".  Assessment & Plan:  Acute cerebellar stroke: -History of CVA,  -Patient presented with an episode of weakness, dizziness resulting in a fall at home with no acute injury on imaging studies.  -Reviewed MRI brain,  CT head and CT cervical spine.  - Episode lasted for 30 minutes with no residual focal neurological deficits. -Patient arrived outside for TPA window. -Carotid ultrasound shows mild carotid atherosclerosis at the bifurcation without signs of restenosis.  Patent vertebral arteries with normal flow. -Continue aspirin, atorvastatin and Plavix. -Echo from 08/2020 showed ejection fraction of 50 to 55% with grade 1 diastolic dysfunction. -A1c: 5.0%, lipid panel: LDL: 47, total cholesterol: 118. -Order transthoracic echo, CTA head and neck as per neurology recommendations. -Consult cardiology for loop recorder interrogation-discussed with Dr. Caryl Comes via secure chat. -Allow permissive hypertension -Consult PT/OT/SLP  Right arm/right shoulder pain: Status post fall at home. -X-ray of right shoulder and arm: Negative for acute findings. -Continue as needed pain medications.  Continue PT/OT.  Hypertension: Blood pressure is slightly elevated.  Continue to hold home antihypertensive for permissive hypertension.  Chronic anemia: H&H is currently stable.  Continue to monitor.  Pulmonary fibrosis/chronic respiratory failure with hypoxia: Patient uses oxygen-2 L at baseline at home. -Continue albuterol as needed.  CKD stage IIIa: -Kidney function is improving.  Continue to monitor.  Asymptomatic bacteriuria: Patient's urine is positive for rare bacteria.  Patient denies urinary symptoms.  No indication of antibiotics at this time.  She is afebrile with no leukocytosis.  Obesity with BMI of 33: Diet modification/exercise and weight loss recommended.  Long-term current use of opiate analgesic for chronic pain: Continue/resume home analgesics.  DVT prophylaxis: Lovenox Code Status: Full code-confirmed with the patient Family Communication:  None present at  bedside.  Plan of care discussed with patient in length and she verbalized understanding and agreed with it. Disposition Plan: To be  determined  Consultants:   Neurology  Procedures:   CT head  CT cervical spine  MRI brain  Carotid Doppler  Antimicrobials:   None  Status is: Inpatient   Dispo: The patient is from: Home              Anticipated d/c is to: SNF              Anticipated d/c date is: 1 day              Patient currently is not medically stable to d/c.         Subjective: Patient seen and examined in the ED.  Tells me that she feels much better.  Denies numbness weakness tingling sensation in hands or feet.  Denies slurred speech, facial drop, headache, dizziness, lightheadedness, chest pain or shortness of breath.  Continues to have right shoulder pain.  She denies fever or chills.  Objective: Vitals:   12/10/20 1130 12/10/20 1200 12/10/20 1300 12/10/20 1400  BP:  (!) 159/73 (!) 161/84 134/88  Pulse: 75 74 74 80  Resp: 18 (!) 21 20 (!) 22  Temp:      TempSrc:      SpO2: 98% 98% 99% 98%  Weight:       No intake or output data in the 24 hours ending 12/10/20 1632 Filed Weights   12/10/20 0009  Weight: 90.7 kg    Examination:  General exam: Appears calm and comfortable, elderly, on room air, communicating well Respiratory system: Clear to auscultation. Respiratory effort normal. Cardiovascular system: S1 & S2 heard, RRR. No JVD, murmurs, rubs, gallops or clicks. No pedal edema. Gastrointestinal system: Abdomen is nondistended, soft and nontender. No organomegaly or masses felt. Normal bowel sounds heard. Central nervous system: Alert and oriented. No focal neurological deficits. Extremities: Symmetric 5 x 5 power. Skin: No rashes, lesions or ulcers Psychiatry: Judgement and insight appear normal. Mood & affect appropriate.    Data Reviewed: I have personally reviewed following labs and imaging studies  CBC: Recent Labs  Lab 12/09/20 1309 12/09/20 2149  WBC 7.2 6.4  NEUTROABS 5.6  --   HGB 10.4* 10.8*  HCT 32.7* 33.7*  MCV 89.6 88.9  PLT 224 [id number]   Basic  Metabolic Panel: Recent Labs  Lab 12/09/20 1309 12/09/20 2149  NA 139  --   K 4.2  --   CL 104  --   CO2 27  --   GLUCOSE 93  --   BUN 15  --   CREATININE 1.28* 1.13*  CALCIUM 9.1  --    GFR: Estimated Creatinine Clearance: 44.2 mL/min (A) (by C-G formula based on SCr of 1.13 mg/dL (H)). Liver Function Tests: No results for input(s): AST, ALT, ALKPHOS, BILITOT, PROT, ALBUMIN in the last 168 hours. No results for input(s): LIPASE, AMYLASE in the last 168 hours. No results for input(s): AMMONIA in the last 168 hours. Coagulation Profile: Recent Labs  Lab 12/09/20 1309  INR 1.0   Cardiac Enzymes: No results for input(s): CKTOTAL, CKMB, CKMBINDEX, TROPONINI in the last 168 hours. BNP (last 3 results) No results for input(s): PROBNP in the last 8760 hours. HbA1C: Recent Labs    12/10/20 0459  HGBA1C 5.0   CBG: No results for input(s): GLUCAP in the last 168 hours. Lipid Profile: Recent Labs    12/10/20 0459  CHOL 118  HDL 57  LDLCALC 47  TRIG 70  CHOLHDL 2.1   Thyroid Function Tests: No results for input(s): TSH, T4TOTAL, FREET4, T3FREE, THYROIDAB in the last 72 hours. Anemia Panel: No results for input(s): VITAMINB12, FOLATE, FERRITIN, TIBC, IRON, RETICCTPCT in the last 72 hours. Sepsis Labs: No results for input(s): PROCALCITON, LATICACIDVEN in the last 168 hours.  Recent Results (from the past 240 hour(s))  SARS CORONAVIRUS 2 (TAT 6-24 HRS) Nasopharyngeal Nasopharyngeal Swab     Status: None   Collection Time: 12/09/20  9:17 PM   Specimen: Nasopharyngeal Swab  Result Value Ref Range Status   SARS Coronavirus 2 NEGATIVE NEGATIVE Final    Comment: (NOTE) SARS-CoV-2 target nucleic acids are NOT DETECTED.  The SARS-CoV-2 RNA is generally detectable in upper and lower respiratory specimens during the acute phase of infection. Negative results do not preclude SARS-CoV-2 infection, do not rule out co-infections with other pathogens, and should not be used as  the sole basis for treatment or other patient management decisions. Negative results must be combined with clinical observations, patient history, and epidemiological information. The expected result is Negative.  Fact Sheet for Patients: SugarRoll.be  Fact Sheet for Healthcare Providers: https://www.woods-mathews.com/  This test is not yet approved or cleared by the [place] FDA and  has been authorized for detection and/or diagnosis of SARS-CoV-2 by FDA under an Emergency Use Authorization (EUA). This EUA will remain  in effect (meaning this test can be used) for the duration of the COVID-19 declaration under Se ction 564(b)(1) of the Act, 21 U.S.C. section 360bbb-3(b)(1), unless the authorization is terminated or revoked sooner.  Performed at [facility], [place] [address] [place], [place] [address]       Radiology Studies: CT ANGIO HEAD W OR WO CONTRAST  Result Date: 12/10/2020 CLINICAL DATA:  Acute right cerebellar infarction. EXAM: CT ANGIOGRAPHY HEAD AND NECK TECHNIQUE: Multidetector CT imaging of the head and neck was performed using the standard protocol during bolus administration of intravenous contrast. Multiplanar CT image reconstructions and MIPs were obtained to evaluate the vascular anatomy. Carotid stenosis measurements (when applicable) are obtained utilizing NASCET criteria, using the distal internal carotid diameter as the denominator. CONTRAST:  34mL OMNIPAQUE IOHEXOL 350 MG/ML SOLN COMPARISON:  MRI yesterday. FINDINGS: CT HEAD FINDINGS Brain: Old left cerebellar infarction. Chronic small-vessel ischemic changes of the hemispheric white matter. Old right frontal and left parietal cortical and subcortical infarctions. No mass lesion, hemorrhage, hydrocephalus or extra-axial collection. Vascular: There is atherosclerotic calcification of the major vessels at the base of the brain. Skull: Negative Sinuses: Clear Orbits:  Normal Review of the MIP images confirms the above findings CTA NECK FINDINGS Aortic arch: Aortic atherosclerosis. Branching pattern is normal. 30-50% stenosis of the proximal left subclavian artery. Right carotid system: Common carotid artery widely patent to the bifurcation. Calcified plaque at the carotid bifurcation but without stenosis. Cervical ICA widely patent. Left carotid system: Common carotid artery widely patent to the bifurcation region. Calcified plaque at the carotid bifurcation and ICA bulb. 20% stenosis of the proximal ICA bulb. More distal cervical ICA widely patent. Vertebral arteries: Both vertebral artery origins are widely patent. Both vertebral arteries are patent through the cervical region to the foramen magnum. Skeleton: Pronounced cervical spondylosis and facet arthropathy. Previous posterior decompression and fusion at C5-6. Other neck: No mass or lymphadenopathy. Upper chest: Chronic lung disease with emphysema, scarring and bronchiectasis. No identifiable acute pneumonia. Review of the MIP images confirms the above findings  CTA HEAD FINDINGS Anterior circulation: Both internal carotid arteries are patent through the skull base and siphon regions. There is siphon atherosclerotic calcification but no stenosis greater than 30%. The anterior and middle cerebral vessels are patent. No large or medium vessel occlusion. Posterior circulation: Both vertebral arteries are patent through the foramen magnum to the basilar. No basilar stenosis. Posterior circulation branch vessels are patent. Primary anterior circulation supply to the posterior cerebral arteries. Venous sinuses: Patent and normal. Anatomic variants: None significant. Review of the MIP images confirms the above findings IMPRESSION: 1. No large or medium vessel occlusion. 2. Aortic atherosclerosis. 30-50% stenosis of the proximal left subclavian artery. 3. Atherosclerotic disease at both carotid bifurcations but without stenosis on  the right. 20% stenosis of the proximal ICA bulb on the left. 4. No significant posterior circulation pathology identified. 5. Chronic lung disease with emphysema, scarring and bronchiectasis. No identifiable acute pneumonia. Aortic Atherosclerosis (ICD10-I70.0) and Emphysema (ICD10-J43.9). Electronically Signed   By: Paulina Fusi M.D.   On: 12/10/2020 15:22   DG Shoulder Right  Result Date: 12/09/2020 CLINICAL DATA:  Right shoulder and upper arm pain after trip and fall over a rug today. Initial encounter. EXAM: RIGHT SHOULDER - 2+ VIEW COMPARISON:  None. FINDINGS: There is no evidence of fracture or dislocation. Mild-to-moderate acromioclavicular and glenohumeral osteoarthritis noted. Soft tissues are unremarkable. IMPRESSION: No acute abnormality. Electronically Signed   By: Drusilla Kanner M.D.   On: 12/09/2020 13:52   CT Head Wo Contrast  Result Date: 12/09/2020 CLINICAL DATA:  Head injury after fall. EXAM: CT HEAD WITHOUT CONTRAST TECHNIQUE: Contiguous axial images were obtained from the base of the skull through the vertex without intravenous contrast. COMPARISON:  September 17, 2020. FINDINGS: Brain: Mild diffuse cortical atrophy is noted. Old right frontal and posterior parietal infarctions are noted. No mass effect or midline shift is noted. Ventricular size is within normal limits. There is no evidence of mass lesion, hemorrhage or acute infarction. Vascular: No hyperdense vessel or unexpected calcification. Skull: Normal. Negative for fracture or focal lesion. Sinuses/Orbits: No acute finding. Other: None. IMPRESSION: Mild diffuse cortical atrophy. Old right frontal and posterior parietal infarctions. No acute intracranial abnormality seen. Electronically Signed   By: Lupita Raider M.D.   On: 12/09/2020 15:37   CT ANGIO NECK W OR WO CONTRAST  Result Date: 12/10/2020 CLINICAL DATA:  Acute right cerebellar infarction. EXAM: CT ANGIOGRAPHY HEAD AND NECK TECHNIQUE: Multidetector CT imaging of the  head and neck was performed using the standard protocol during bolus administration of intravenous contrast. Multiplanar CT image reconstructions and MIPs were obtained to evaluate the vascular anatomy. Carotid stenosis measurements (when applicable) are obtained utilizing NASCET criteria, using the distal internal carotid diameter as the denominator. CONTRAST:  29mL OMNIPAQUE IOHEXOL 350 MG/ML SOLN COMPARISON:  MRI yesterday. FINDINGS: CT HEAD FINDINGS Brain: Old left cerebellar infarction. Chronic small-vessel ischemic changes of the hemispheric white matter. Old right frontal and left parietal cortical and subcortical infarctions. No mass lesion, hemorrhage, hydrocephalus or extra-axial collection. Vascular: There is atherosclerotic calcification of the major vessels at the base of the brain. Skull: Negative Sinuses: Clear Orbits: Normal Review of the MIP images confirms the above findings CTA NECK FINDINGS Aortic arch: Aortic atherosclerosis. Branching pattern is normal. 30-50% stenosis of the proximal left subclavian artery. Right carotid system: Common carotid artery widely patent to the bifurcation. Calcified plaque at the carotid bifurcation but without stenosis. Cervical ICA widely patent. Left carotid system: Common carotid artery widely patent to  the bifurcation region. Calcified plaque at the carotid bifurcation and ICA bulb. 20% stenosis of the proximal ICA bulb. More distal cervical ICA widely patent. Vertebral arteries: Both vertebral artery origins are widely patent. Both vertebral arteries are patent through the cervical region to the foramen magnum. Skeleton: Pronounced cervical spondylosis and facet arthropathy. Previous posterior decompression and fusion at C5-6. Other neck: No mass or lymphadenopathy. Upper chest: Chronic lung disease with emphysema, scarring and bronchiectasis. No identifiable acute pneumonia. Review of the MIP images confirms the above findings CTA HEAD FINDINGS Anterior  circulation: Both internal carotid arteries are patent through the skull base and siphon regions. There is siphon atherosclerotic calcification but no stenosis greater than 30%. The anterior and middle cerebral vessels are patent. No large or medium vessel occlusion. Posterior circulation: Both vertebral arteries are patent through the foramen magnum to the basilar. No basilar stenosis. Posterior circulation branch vessels are patent. Primary anterior circulation supply to the posterior cerebral arteries. Venous sinuses: Patent and normal. Anatomic variants: None significant. Review of the MIP images confirms the above findings IMPRESSION: 1. No large or medium vessel occlusion. 2. Aortic atherosclerosis. 30-50% stenosis of the proximal left subclavian artery. 3. Atherosclerotic disease at both carotid bifurcations but without stenosis on the right. 20% stenosis of the proximal ICA bulb on the left. 4. No significant posterior circulation pathology identified. 5. Chronic lung disease with emphysema, scarring and bronchiectasis. No identifiable acute pneumonia. Aortic Atherosclerosis (ICD10-I70.0) and Emphysema (ICD10-J43.9). Electronically Signed   By: Nelson Chimes M.D.   On: 12/10/2020 15:22   CT Cervical Spine Wo Contrast  Result Date: 12/09/2020 CLINICAL DATA:  80 year old female with neck trauma. EXAM: CT CERVICAL SPINE WITHOUT CONTRAST TECHNIQUE: Multidetector CT imaging of the cervical spine was performed without intravenous contrast. Multiplanar CT image reconstructions were also generated. COMPARISON:  Cervical spine CT dated 09/17/2020. FINDINGS: Alignment: No acute subluxation. Skull base and vertebrae: No acute fracture. C5-C6 decompression and fusion. Soft tissues and spinal canal: No prevertebral fluid or swelling. No visible canal hematoma. Disc levels:  Multilevel degenerative changes. Upper chest: Biapical linear scarring. Other: None IMPRESSION: 1. No acute/traumatic cervical spine pathology. 2.  C5-C6 decompression and fusion. Electronically Signed   By: Anner Crete M.D.   On: 12/09/2020 17:43   MR BRAIN WO CONTRAST  Result Date: 12/09/2020 CLINICAL DATA:  TIA EXAM: MRI HEAD WITHOUT CONTRAST TECHNIQUE: Multiplanar, multiecho pulse sequences of the brain and surrounding structures were obtained without intravenous contrast. COMPARISON:  09/30/2019 FINDINGS: Brain: Small subcentimeter focus of restricted diffusion is present in the right cerebellum. Multiple chronic infarcts are again identified including involvement of the right frontal lobe, left occipitoparietal lobe, and left cerebellum. There are chronic blood products associated with the areas of chronic infarction. Additional focus of susceptibility in the inferior right parietal lobe likely reflects a chronic microhemorrhage. Other patchy T2 hyperintensity in the supratentorial white matter is nonspecific but most likely reflects stable mild chronic microvascular ischemic changes. Prominence of the ventricles and sulci reflects stable parenchymal volume loss. There is no intracranial mass or mass effect. There is no hydrocephalus or extra-axial fluid collection. Vascular: Major vessel flow voids at the skull base are preserved. Skull and upper cervical spine: Normal marrow signal is preserved. Sinuses/Orbits: Paranasal sinuses are aerated. Bilateral lens replacements. Other: Sella is unremarkable.  Mastoid air cells are clear. IMPRESSION: Small acute right cerebellar infarct. Multiple chronic infarcts and chronic microvascular ischemic changes as seen previously. Electronically Signed   By: Addison Lank.D.  On: 12/09/2020 20:09   US Carotid Bilateral (at [facility] and [facility] only)  Result Date: 12/10/2020 CLINICAL DATA:  Stroke workup EXAM: BILATERAL CAROTID DUPLEX ULTRASOUND TECHNIQUE: Pearline Cables scale imaging, color Doppler and duplex ultrasound were performed of bilateral carotid and vertebral arteries in the neck. COMPARISON:  06/13/2019  FINDINGS: Criteria: Quantification of carotid stenosis is based on velocity parameters that correlate the residual internal carotid diameter with NASCET-based stenosis levels, using the diameter of the distal internal carotid lumen as the denominator for stenosis measurement. The following velocity measurements were obtained: RIGHT ICA: 69/20 cm/sec CCA: [id number] cm/sec SYSTOLIC ICA/CCA RATIO:  0.9 ECA: 61 cm/sec LEFT ICA: 84/23 cm/sec CCA: [id number] cm/sec SYSTOLIC ICA/CCA RATIO:  1.3 ECA: 92 cm/sec RIGHT CAROTID ARTERY: Mild atheromatous plaque seen at the bifurcation. RIGHT VERTEBRAL ARTERY:  Antegrade flow with normal waveform LEFT CAROTID ARTERY: Mild atheromatous plaque seen at the bifurcation. LEFT VERTEBRAL ARTERY:  Antegrade flow with normal waveform IMPRESSION: 1. Mild carotid atherosclerosis at the bifurcations without signs of stenosis. 2. Patent vertebral arteries with normal flow. Electronically Signed   By: Monte Fantasia M.D.   On: 12/10/2020 06:50   DG Humerus Right  Result Date: 12/09/2020 CLINICAL DATA:  Right shoulder and upper arm pain after trip and fall over a rug today. Initial encounter. EXAM: RIGHT HUMERUS - 2+ VIEW COMPARISON:  None. FINDINGS: There is no evidence of fracture or other focal bone lesions. Soft tissues are unremarkable. IMPRESSION: Negative exam. Electronically Signed   By: Inge Rise M.D.   On: 12/09/2020 13:52    Scheduled Meds: .  stroke: mapping our early stages of recovery book   Does not apply Once  . aspirin EC  81 mg Oral Daily  . atorvastatin  80 mg Oral q1800  . clopidogrel  75 mg Oral Daily  . enoxaparin (LOVENOX) injection  40 mg Subcutaneous Q24H   Continuous Infusions:   LOS: 0 days   Time spent: 35 minutes   Lamondre Wesche Loann Quill, MD [facility]  If 7PM-7AM, please contact night-coverage [email or web address] 12/10/2020, 4:32 PM

## 2020-12-10 NOTE — Progress Notes (Signed)
Spoke with Bosie Clos , RN this writer called [facility], and he was upset about patient not having ipad, Counsellor sent up meditronic device, it is at patients bedside , it would not let this writer get a reading d/t device battery was too low. Handheld device and IPAD at bedside charging at this time. Will report to next nurse/

## 2020-12-10 NOTE — ED Notes (Signed)
Received call from [place] with [facility] as pt was being transported to floor by transport  stating that metronic ipad can be used to interrogate loop recorder. This RN gave information to Lauren LPN with verbatim instructions and provided phone number given [phone]

## 2020-12-11 DIAGNOSIS — I639 Cerebral infarction, unspecified: Secondary | ICD-10-CM | POA: Diagnosis not present

## 2020-12-11 LAB — BASIC METABOLIC PANEL
Anion gap: 9 (ref 5–15)
BUN: 17 mg/dL (ref 8–23)
CO2: 27 mmol/L (ref 22–32)
Calcium: 8.8 mg/dL — ABNORMAL LOW (ref 8.9–10.3)
Chloride: 104 mmol/L (ref 98–111)
Creatinine, Ser: 0.99 mg/dL (ref 0.44–1.00)
GFR, Estimated: 58 mL/min — ABNORMAL LOW (ref >60)
Glucose, Bld: 84 mg/dL (ref 70–99)
Potassium: 3.8 mmol/L (ref 3.5–5.1)
Sodium: 140 mmol/L (ref 135–145)

## 2020-12-11 LAB — CBC
HCT: 31.5 % — ABNORMAL LOW (ref 36.0–46.0)
Hemoglobin: 10.2 g/dL — ABNORMAL LOW (ref 12.0–15.0)
MCH: 28.7 pg (ref 26.0–34.0)
MCHC: 32.4 g/dL (ref 30.0–36.0)
MCV: 88.5 fL (ref 80.0–100.0)
Platelets: 235 10*3/uL (ref 150–400)
RBC: 3.56 MIL/uL — ABNORMAL LOW (ref 3.87–5.11)
RDW: 14.3 % (ref 11.5–15.5)
WBC: 6.9 10*3/uL (ref 4.0–10.5)
nRBC: 0 % (ref 0.0–0.2)

## 2020-12-11 LAB — ECHOCARDIOGRAM COMPLETE
Area-P 1/2: 2.76 cm2
S' Lateral: 3.81 cm
Weight: 3200 oz

## 2020-12-11 MED ORDER — FAMOTIDINE 20 MG PO TABS
20.0000 mg | ORAL_TABLET | Freq: Every day | ORAL | Status: DC
  Administered 2020-12-11: 20 mg via ORAL
  Filled 2020-12-11: qty 1

## 2020-12-11 MED ORDER — PREGABALIN 50 MG PO CAPS
100.0000 mg | ORAL_CAPSULE | Freq: Every day | ORAL | Status: DC
  Administered 2020-12-11: 100 mg via ORAL
  Filled 2020-12-11: qty 2

## 2020-12-11 MED ORDER — BUPROPION HCL ER (XL) 150 MG PO TB24
300.0000 mg | ORAL_TABLET | Freq: Every day | ORAL | Status: DC
  Administered 2020-12-11 – 2020-12-12 (×2): 300 mg via ORAL
  Filled 2020-12-11 (×2): qty 2

## 2020-12-11 MED ORDER — DULOXETINE HCL 30 MG PO CPEP
30.0000 mg | ORAL_CAPSULE | Freq: Every day | ORAL | Status: DC
  Administered 2020-12-11 – 2020-12-12 (×2): 30 mg via ORAL
  Filled 2020-12-11 (×2): qty 1

## 2020-12-11 MED ORDER — PREGABALIN 50 MG PO CAPS
50.0000 mg | ORAL_CAPSULE | Freq: Every day | ORAL | Status: DC
  Administered 2020-12-11 – 2020-12-12 (×2): 50 mg via ORAL
  Filled 2020-12-11 (×2): qty 1

## 2020-12-11 NOTE — Progress Notes (Signed)
PROGRESS NOTE    Melody Ochoa  [name] DOB: 1941/06/02 DOA: 12/09/2020 PCP: Burnard Hawthorne, FNP   Brief Narrative:  Melody Ochoa is a 80 y.o. female with medical history significant for history of CVA four years prior treated with tPA, chronic back pain on chronic opiates, ambulant with a cane,, pulmonary fibrosis on home O2 at 2 L, depression, who presents to the emergency room bi EMS following a fall at home.  Patient states that when she awoke several hours earlier she dizzy and her speech was slurred and felt weakness in the right leg.  As she got up she fell onto her right side striking her head, right arm and shoulder. Her husband tried to help her up but she couldn't get up so they called EMS.  The weakness and slurred speech lasted 20 to 30 minutes and then resolved however she does have residual dizziness.  She denied headache or visual disturbance.  Denies palpitations, chest pain, shortness of breath, cough fever or chills.  She arrived to the emergency room more than 4 hours after the symptoms ED Course: On arrival, BP 188/91, pulse 80, O2 sat 92% on room air.  Afebrile.  She had an extensive trauma work-up to include CT scan of the head, C-spine as well as right shoulder and humerus which showed no acute injury.  Her blood work was unremarkable.  Creatinine 1.28 which is near her baseline of 1.18.  Hemoglobin 10.4 close to the baseline of 11.7.  The emergency room provider spoke with neurologist, Dr. Theda Sers who recommended getting an MRI with discharge if negative and admission if positive.  MRI was subsequently done which revealed "Small acute right cerebellar infarct.Multiple chronic infarcts and chronic microvascular ischemic changes as seen previously".  Assessment & Plan:  Acute cerebellar stroke: -History of CVA, history of loop recorder implantation on 9/18 for cryptogenic stroke. -Patient presented with an episode of weakness, dizziness resulting in a fall at  home with no acute injury on imaging studies.  Episode lasted for 30 minutes with no residual focal neurological deficit. -Reviewed MRI brain, CT head and CT cervical spine.  -Patient arrived outside for TPA window. -Carotid ultrasound shows mild carotid atherosclerosis at the bifurcation without signs of restenosis.  Patent vertebral arteries with normal flow. -Continue aspirin, atorvastatin and Plavix. -Need to monitor closely on telemetry.   -A1c: 5.0%, lipid panel: LDL: 47, total cholesterol: 118. -Echo is pending.  CTA head and neck shows no large or medium vessel occlusion. -Device interrogation done by cardiology shows no A. fib seen on loop interrogation. -PT/OT recommended SNF however patient wishes to go home.  Right arm/right shoulder pain: Status post fall at home. -X-ray of right shoulder and arm: Negative for acute findings. -Continue as needed pain medications.  Continue PT/OT.  Hypertension: Blood pressure is labile.  May begin to gradually reduce blood pressure over next 3 to 5 days-as per neurology recommendations.  Chronic anemia: H&H is currently stable.  Continue to monitor.  Pulmonary fibrosis/chronic respiratory failure with hypoxia: Patient uses oxygen-2 L at baseline at home. -Continue albuterol as needed.  CKD stage IIIa: -Kidney function is improving.  Continue to monitor.  Asymptomatic bacteriuria: Patient's urine is positive for rare bacteria.  Patient denies urinary symptoms.  No indication of antibiotics at this time.  She is afebrile with no leukocytosis.  Obesity with BMI of 33: Diet modification/exercise and weight loss recommended.  Long-term current use of opiate analgesic for chronic pain: Continue/resume home analgesics.  DVT prophylaxis: Lovenox Code Status: Full code-confirmed with the patient Family Communication:  None present at bedside.  Plan of care discussed with patient in length and she verbalized understanding and agreed with  it. Disposition Plan: To be determined  Consultants:   Neurology  Procedures:   CT head  CT cervical spine  MRI brain  Carotid Doppler  Antimicrobials:   None  Status is: Inpatient   Dispo: The patient is from: Home              Anticipated d/c is to: SNF              Anticipated d/c date is: 1 day              Patient currently is not medically stable to d/c.    Subjective: Patient seen and examined this morning.  Lying comfortably on the bed.  No new complaints.  Denies headache, blurry vision, chest pain, shortness of breath, palpitation, lightheadedness, dizziness, nausea or vomiting.  Tells me that she is hungry.   Objective: Vitals:   12/10/20 2100 12/11/20 0140 12/11/20 0730 12/11/20 1214  BP: (!) 161/78 (!) 154/85 (!) 158/70 136/68  Pulse: 84 80 76 77  Resp: 19 19 16 16   Temp: 98.1 F (36.7 C) 97.7 F (36.5 C) 98.3 F (36.8 C) 98.1 F (36.7 C)  TempSrc:      SpO2: 99% 99% 100% 99%  Weight:       No intake or output data in the 24 hours ending 12/11/20 1424 Filed Weights   12/10/20 0009  Weight: 90.7 kg    Examination:  General exam: Appears calm and comfortable, elderly, on room air, communicating well Respiratory system: Clear to auscultation. Respiratory effort normal. Cardiovascular system: S1 & S2 heard, RRR. No JVD, murmurs, rubs, gallops or clicks. No pedal edema. Gastrointestinal system: Abdomen is nondistended, soft and nontender. No organomegaly or masses felt. Normal bowel sounds heard. Central nervous system: Alert and oriented. No focal neurological deficits. Extremities: Symmetric 5 x 5 power. Skin: No rashes, lesions or ulcers Psychiatry: Judgement and insight appear normal. Mood & affect appropriate.    Data Reviewed: I have personally reviewed following labs and imaging studies  CBC: Recent Labs  Lab 12/09/20 1309 12/09/20 2149 12/11/20 0342  WBC 7.2 6.4 6.9  NEUTROABS 5.6  --   --   HGB 10.4* 10.8* 10.2*  HCT 32.7*  33.7* 31.5*  MCV 89.6 88.9 88.5  PLT 224 237 [id number]   Basic Metabolic Panel: Recent Labs  Lab 12/09/20 1309 12/09/20 2149 12/11/20 0342  NA 139  --  140  K 4.2  --  3.8  CL 104  --  104  CO2 27  --  27  GLUCOSE 93  --  84  BUN 15  --  17  CREATININE 1.28* 1.13* 0.99  CALCIUM 9.1  --  8.8*   GFR: Estimated Creatinine Clearance: 50.4 mL/min (by C-G formula based on SCr of 0.99 mg/dL). Liver Function Tests: No results for input(s): AST, ALT, ALKPHOS, BILITOT, PROT, ALBUMIN in the last 168 hours. No results for input(s): LIPASE, AMYLASE in the last 168 hours. No results for input(s): AMMONIA in the last 168 hours. Coagulation Profile: Recent Labs  Lab 12/09/20 1309  INR 1.0   Cardiac Enzymes: No results for input(s): CKTOTAL, CKMB, CKMBINDEX, TROPONINI in the last 168 hours. BNP (last 3 results) No results for input(s): PROBNP in the last 8760 hours. HbA1C: Recent Labs    12/10/20  0459  HGBA1C 5.0   CBG: No results for input(s): GLUCAP in the last 168 hours. Lipid Profile: Recent Labs    12/10/20 0459  CHOL 118  HDL 57  LDLCALC 47  TRIG 70  CHOLHDL 2.1   Thyroid Function Tests: No results for input(s): TSH, T4TOTAL, FREET4, T3FREE, THYROIDAB in the last 72 hours. Anemia Panel: No results for input(s): VITAMINB12, FOLATE, FERRITIN, TIBC, IRON, RETICCTPCT in the last 72 hours. Sepsis Labs: No results for input(s): PROCALCITON, LATICACIDVEN in the last 168 hours.  Recent Results (from the past 240 hour(s))  SARS CORONAVIRUS 2 (TAT 6-24 HRS) Nasopharyngeal Nasopharyngeal Swab     Status: None   Collection Time: 12/09/20  9:17 PM   Specimen: Nasopharyngeal Swab  Result Value Ref Range Status   SARS Coronavirus 2 NEGATIVE NEGATIVE Final    Comment: (NOTE) SARS-CoV-2 target nucleic acids are NOT DETECTED.  The SARS-CoV-2 RNA is generally detectable in upper and lower respiratory specimens during the acute phase of infection. Negative results do not preclude  SARS-CoV-2 infection, do not rule out co-infections with other pathogens, and should not be used as the sole basis for treatment or other patient management decisions. Negative results must be combined with clinical observations, patient history, and epidemiological information. The expected result is Negative.  Fact Sheet for Patients: SugarRoll.be  Fact Sheet for Healthcare Providers: https://www.woods-mathews.com/  This test is not yet approved or cleared by the [place] FDA and  has been authorized for detection and/or diagnosis of SARS-CoV-2 by FDA under an Emergency Use Authorization (EUA). This EUA will remain  in effect (meaning this test can be used) for the duration of the COVID-19 declaration under Se ction 564(b)(1) of the Act, 21 U.S.C. section 360bbb-3(b)(1), unless the authorization is terminated or revoked sooner.  Performed at [facility], [place] [address], [place], [place] [address]       Radiology Studies: CT ANGIO HEAD W OR WO CONTRAST  Result Date: 12/10/2020 CLINICAL DATA:  Acute right cerebellar infarction. EXAM: CT ANGIOGRAPHY HEAD AND NECK TECHNIQUE: Multidetector CT imaging of the head and neck was performed using the standard protocol during bolus administration of intravenous contrast. Multiplanar CT image reconstructions and MIPs were obtained to evaluate the vascular anatomy. Carotid stenosis measurements (when applicable) are obtained utilizing NASCET criteria, using the distal internal carotid diameter as the denominator. CONTRAST:  90mL OMNIPAQUE IOHEXOL 350 MG/ML SOLN COMPARISON:  MRI yesterday. FINDINGS: CT HEAD FINDINGS Brain: Old left cerebellar infarction. Chronic small-vessel ischemic changes of the hemispheric white matter. Old right frontal and left parietal cortical and subcortical infarctions. No mass lesion, hemorrhage, hydrocephalus or extra-axial collection. Vascular: There is atherosclerotic  calcification of the major vessels at the base of the brain. Skull: Negative Sinuses: Clear Orbits: Normal Review of the MIP images confirms the above findings CTA NECK FINDINGS Aortic arch: Aortic atherosclerosis. Branching pattern is normal. 30-50% stenosis of the proximal left subclavian artery. Right carotid system: Common carotid artery widely patent to the bifurcation. Calcified plaque at the carotid bifurcation but without stenosis. Cervical ICA widely patent. Left carotid system: Common carotid artery widely patent to the bifurcation region. Calcified plaque at the carotid bifurcation and ICA bulb. 20% stenosis of the proximal ICA bulb. More distal cervical ICA widely patent. Vertebral arteries: Both vertebral artery origins are widely patent. Both vertebral arteries are patent through the cervical region to the foramen magnum. Skeleton: Pronounced cervical spondylosis and facet arthropathy. Previous posterior decompression and fusion at C5-6. Other neck:  No mass or lymphadenopathy. Upper chest: Chronic lung disease with emphysema, scarring and bronchiectasis. No identifiable acute pneumonia. Review of the MIP images confirms the above findings CTA HEAD FINDINGS Anterior circulation: Both internal carotid arteries are patent through the skull base and siphon regions. There is siphon atherosclerotic calcification but no stenosis greater than 30%. The anterior and middle cerebral vessels are patent. No large or medium vessel occlusion. Posterior circulation: Both vertebral arteries are patent through the foramen magnum to the basilar. No basilar stenosis. Posterior circulation branch vessels are patent. Primary anterior circulation supply to the posterior cerebral arteries. Venous sinuses: Patent and normal. Anatomic variants: None significant. Review of the MIP images confirms the above findings IMPRESSION: 1. No large or medium vessel occlusion. 2. Aortic atherosclerosis. 30-50% stenosis of the proximal left  subclavian artery. 3. Atherosclerotic disease at both carotid bifurcations but without stenosis on the right. 20% stenosis of the proximal ICA bulb on the left. 4. No significant posterior circulation pathology identified. 5. Chronic lung disease with emphysema, scarring and bronchiectasis. No identifiable acute pneumonia. Aortic Atherosclerosis (ICD10-I70.0) and Emphysema (ICD10-J43.9). Electronically Signed   By: Nelson Chimes M.D.   On: 12/10/2020 15:22   CT ANGIO NECK W OR WO CONTRAST  Result Date: 12/10/2020 CLINICAL DATA:  Acute right cerebellar infarction. EXAM: CT ANGIOGRAPHY HEAD AND NECK TECHNIQUE: Multidetector CT imaging of the head and neck was performed using the standard protocol during bolus administration of intravenous contrast. Multiplanar CT image reconstructions and MIPs were obtained to evaluate the vascular anatomy. Carotid stenosis measurements (when applicable) are obtained utilizing NASCET criteria, using the distal internal carotid diameter as the denominator. CONTRAST:  23mL OMNIPAQUE IOHEXOL 350 MG/ML SOLN COMPARISON:  MRI yesterday. FINDINGS: CT HEAD FINDINGS Brain: Old left cerebellar infarction. Chronic small-vessel ischemic changes of the hemispheric white matter. Old right frontal and left parietal cortical and subcortical infarctions. No mass lesion, hemorrhage, hydrocephalus or extra-axial collection. Vascular: There is atherosclerotic calcification of the major vessels at the base of the brain. Skull: Negative Sinuses: Clear Orbits: Normal Review of the MIP images confirms the above findings CTA NECK FINDINGS Aortic arch: Aortic atherosclerosis. Branching pattern is normal. 30-50% stenosis of the proximal left subclavian artery. Right carotid system: Common carotid artery widely patent to the bifurcation. Calcified plaque at the carotid bifurcation but without stenosis. Cervical ICA widely patent. Left carotid system: Common carotid artery widely patent to the bifurcation  region. Calcified plaque at the carotid bifurcation and ICA bulb. 20% stenosis of the proximal ICA bulb. More distal cervical ICA widely patent. Vertebral arteries: Both vertebral artery origins are widely patent. Both vertebral arteries are patent through the cervical region to the foramen magnum. Skeleton: Pronounced cervical spondylosis and facet arthropathy. Previous posterior decompression and fusion at C5-6. Other neck: No mass or lymphadenopathy. Upper chest: Chronic lung disease with emphysema, scarring and bronchiectasis. No identifiable acute pneumonia. Review of the MIP images confirms the above findings CTA HEAD FINDINGS Anterior circulation: Both internal carotid arteries are patent through the skull base and siphon regions. There is siphon atherosclerotic calcification but no stenosis greater than 30%. The anterior and middle cerebral vessels are patent. No large or medium vessel occlusion. Posterior circulation: Both vertebral arteries are patent through the foramen magnum to the basilar. No basilar stenosis. Posterior circulation branch vessels are patent. Primary anterior circulation supply to the posterior cerebral arteries. Venous sinuses: Patent and normal. Anatomic variants: None significant. Review of the MIP images confirms the above findings IMPRESSION: 1. No large  or medium vessel occlusion. 2. Aortic atherosclerosis. 30-50% stenosis of the proximal left subclavian artery. 3. Atherosclerotic disease at both carotid bifurcations but without stenosis on the right. 20% stenosis of the proximal ICA bulb on the left. 4. No significant posterior circulation pathology identified. 5. Chronic lung disease with emphysema, scarring and bronchiectasis. No identifiable acute pneumonia. Aortic Atherosclerosis (ICD10-I70.0) and Emphysema (ICD10-J43.9). Electronically Signed   By: Paulina Fusi M.D.   On: 12/10/2020 15:22   MR BRAIN WO CONTRAST  Result Date: 12/09/2020 CLINICAL DATA:  TIA EXAM: MRI HEAD  WITHOUT CONTRAST TECHNIQUE: Multiplanar, multiecho pulse sequences of the brain and surrounding structures were obtained without intravenous contrast. COMPARISON:  09/30/2019 FINDINGS: Brain: Small subcentimeter focus of restricted diffusion is present in the right cerebellum. Multiple chronic infarcts are again identified including involvement of the right frontal lobe, left occipitoparietal lobe, and left cerebellum. There are chronic blood products associated with the areas of chronic infarction. Additional focus of susceptibility in the inferior right parietal lobe likely reflects a chronic microhemorrhage. Other patchy T2 hyperintensity in the supratentorial white matter is nonspecific but most likely reflects stable mild chronic microvascular ischemic changes. Prominence of the ventricles and sulci reflects stable parenchymal volume loss. There is no intracranial mass or mass effect. There is no hydrocephalus or extra-axial fluid collection. Vascular: Major vessel flow voids at the skull base are preserved. Skull and upper cervical spine: Normal marrow signal is preserved. Sinuses/Orbits: Paranasal sinuses are aerated. Bilateral lens replacements. Other: Sella is unremarkable.  Mastoid air cells are clear. IMPRESSION: Small acute right cerebellar infarct. Multiple chronic infarcts and chronic microvascular ischemic changes as seen previously. Electronically Signed   By: Guadlupe Spanish M.D.   On: 12/09/2020 20:09   US Carotid Bilateral (at [facility] and [facility] only)  Result Date: 12/10/2020 CLINICAL DATA:  Stroke workup EXAM: BILATERAL CAROTID DUPLEX ULTRASOUND TECHNIQUE: Wallace Cullens scale imaging, color Doppler and duplex ultrasound were performed of bilateral carotid and vertebral arteries in the neck. COMPARISON:  06/13/2019 FINDINGS: Criteria: Quantification of carotid stenosis is based on velocity parameters that correlate the residual internal carotid diameter with NASCET-based stenosis levels, using the diameter of  the distal internal carotid lumen as the denominator for stenosis measurement. The following velocity measurements were obtained: RIGHT ICA: 69/20 cm/sec CCA: 75/15 cm/sec SYSTOLIC ICA/CCA RATIO:  0.9 ECA: 61 cm/sec LEFT ICA: 84/23 cm/sec CCA: 88/20 cm/sec SYSTOLIC ICA/CCA RATIO:  1.3 ECA: 92 cm/sec RIGHT CAROTID ARTERY: Mild atheromatous plaque seen at the bifurcation. RIGHT VERTEBRAL ARTERY:  Antegrade flow with normal waveform LEFT CAROTID ARTERY: Mild atheromatous plaque seen at the bifurcation. LEFT VERTEBRAL ARTERY:  Antegrade flow with normal waveform IMPRESSION: 1. Mild carotid atherosclerosis at the bifurcations without signs of stenosis. 2. Patent vertebral arteries with normal flow. Electronically Signed   By: Marnee Spring M.D.   On: 12/10/2020 06:50    Scheduled Meds: .  stroke: mapping our early stages of recovery book   Does not apply Once  . aspirin EC  81 mg Oral Daily  . atorvastatin  80 mg Oral q1800  . buPROPion  300 mg Oral Daily  . clopidogrel  75 mg Oral Daily  . DULoxetine  30 mg Oral Daily  . enoxaparin (LOVENOX) injection  40 mg Subcutaneous Q24H  . famotidine  20 mg Oral QHS  . pregabalin  50 mg Oral Daily   And  . pregabalin  100 mg Oral QHS   Continuous Infusions:   LOS: 1 day   Time spent: 35  minutes   Mckinley Jewel, MD [facility]  If 7PM-7AM, please contact night-coverage [email or web address] 12/11/2020, 2:24 PM

## 2020-12-11 NOTE — Progress Notes (Signed)
Neurology Progress Note  S: No overnight events or new complaints.   O: Current vital signs: BP (!) 158/70 (BP Location: Left Arm)   Pulse 76   Temp 98.3 F (36.8 C)   Resp 16   Wt 90.7 kg   SpO2 100%   BMI 33.28 kg/m  Vital signs in last 24 hours: Temp:  [97.7 F (36.5 C)-98.3 F (36.8 C)] 98.3 F (36.8 C) (01/06 0730) Pulse Rate:  [74-84] 76 (01/06 0730) Resp:  [16-36] 16 (01/06 0730) BP: (134-161)/(70-88) 158/70 (01/06 0730) SpO2:  [97 %-100 %] 100 % (01/06 0730) GENERAL: Awake, alert in NAD HEENT: Normocephalic and atraumatic, dry mm, no LN, no Thyromegally LUNGS: Clear to auscultation bilaterally with no wheezes CV: S1S2 RRR, no m/r/g, equal pulses bilaterally. ABDOMEN: Soft, nontender, nondistended with normoactive BS Ext: warm, well perfused, intact peripheral pulses  NEURO:  Mental Status: AA&Ox3  Language: speech is .  Naming, repetition, fluency, and comprehension intact. Cranial Nerves: PERRL EOMI, visual fields full, no facial asymmetry, facial sensation intact, hearing intact, tongue/uvula/soft palate midline, normal sternocleidomastoid and trapezius muscle strength. No evidence of tongue atrophy or fibrillations Motor: good strength in all extremities Tone: is normal and bulk is normal Sensation- Intact to light touch bilaterally Coordination: FTN intact bilaterally, no ataxia in BLE. Gait- deferred  NIHSS0  Medications  Current Facility-Administered Medications:  .   stroke: mapping our early stages of recovery book, , Does not apply, Once, Athena Masse, MD .  acetaminophen (TYLENOL) tablet 650 mg, 650 mg, Oral, Q4H PRN **OR** acetaminophen (TYLENOL) 160 MG/5ML solution 650 mg, 650 mg, Per Tube, Q4H PRN **OR** acetaminophen (TYLENOL) suppository 650 mg, 650 mg, Rectal, Q4H PRN, Athena Masse, MD .  aspirin EC tablet 81 mg, 81 mg, Oral, Daily, Judd Gaudier V, MD, 81 mg at 12/11/20 0941 .  atorvastatin (LIPITOR) tablet 80 mg, 80 mg, Oral, q1800,  Athena Masse, MD, 80 mg at 12/10/20 1758 .  buPROPion (WELLBUTRIN XL) 24 hr tablet 300 mg, 300 mg, Oral, Daily, Pahwani, Rinka R, MD .  clopidogrel (PLAVIX) tablet 75 mg, 75 mg, Oral, Daily, Judd Gaudier V, MD, 75 mg at 12/11/20 0941 .  DULoxetine (CYMBALTA) DR capsule 30 mg, 30 mg, Oral, Daily, Pahwani, Rinka R, MD .  enoxaparin (LOVENOX) injection 40 mg, 40 mg, Subcutaneous, Q24H, Athena Masse, MD, 40 mg at 12/11/20 0938 .  famotidine (PEPCID) tablet 20 mg, 20 mg, Oral, QHS, Pahwani, Rinka R, MD .  pregabalin (LYRICA) capsule 50 mg, 50 mg, Oral, Daily **AND** pregabalin (LYRICA) capsule 100 mg, 100 mg, Oral, QHS, Pahwani, Rinka R, MD .  senna-docusate (Senokot-S) tablet 1 tablet, 1 tablet, Oral, QHS PRN, Athena Masse, MD Labs CBC    Component Value Date/Time   WBC 6.9 12/11/2020 0342   RBC 3.56 (L) 12/11/2020 0342   HGB 10.2 (L) 12/11/2020 0342   HGB 8.6 (L) 12/02/2014 0610   HCT 31.5 (L) 12/11/2020 0342   HCT 26.7 (L) 12/02/2014 0610   PLT 235 12/11/2020 0342   PLT 372 12/02/2014 0610   MCV 88.5 12/11/2020 0342   MCV 87 12/02/2014 0610   MCH 28.7 12/11/2020 0342   MCHC 32.4 12/11/2020 0342   RDW 14.3 12/11/2020 0342   RDW 15.1 (H) 12/02/2014 0610   LYMPHSABS 0.8 12/09/2020 1309   LYMPHSABS 1.4 12/02/2014 0610   MONOABS 0.5 12/09/2020 1309   MONOABS 0.6 12/02/2014 0610   EOSABS 0.2 12/09/2020 1309   EOSABS 0.4 12/02/2014 0610  BASOSABS 0.1 12/09/2020 1309   BASOSABS 0.1 12/02/2014 0610    CMP     Component Value Date/Time   NA 140 12/11/2020 0342   K 3.8 12/11/2020 0342   CL 104 12/11/2020 0342   CO2 27 12/11/2020 0342   GLUCOSE 84 12/11/2020 0342   BUN 17 12/11/2020 0342   CREATININE 0.99 12/11/2020 0342   CALCIUM 8.8 (L) 12/11/2020 0342   PROT CANCELED 05/21/2020 1332   ALBUMIN 4.0 04/22/2020 1119   AST 17 04/22/2020 1119   ALT 13 04/22/2020 1119   ALKPHOS 62 04/22/2020 1119   BILITOT 0.4 04/22/2020 1119   GFRNONAA 58 (L) 12/11/2020 0342   GFRAA 59  (L) 03/11/2020 0931   Lipid Panel     Component Value Date/Time   CHOL 118 12/10/2020 0459   TRIG 70 12/10/2020 0459   HDL 57 12/10/2020 0459   CHOLHDL 2.1 12/10/2020 0459   VLDL 14 12/10/2020 0459   LDLCALC 47 12/10/2020 0459   Imaging I have reviewed the images obtained: NCT head showed did not show acute ischemic changes. MRI brain showed small acute right cerebellar infarct. Chronic infarcts in right frontal lobe, left occipital-parietal and left cerebellum. CTA head and neck showed no large or medium vessel occlusion, 30-50% stenosis of the proximal left subclavian artery. Atherosclerotic disease at both carotid bifurcations but without stenosis on the right; 20% stenosis of the proximal ICA bulb on the left. No significant posterior circulation pathology/flow limiting stenosis. Carotid ultrasound mild carotid atherosclerosis at the bifurcations without signs of stenosis. Patent vertebral arteries with normal flow.  Assessment: Melody Ochoa is a 80 y.o. female right handed PMHx chronic strokes in multiple territories with acute right cerebellar stroke. She does not have significant vascular risk factors and pattern of lesions on MRI suggests embolic from central source; however, to date cardiac workup has been negative.   CTA head and neck did not show significant flow limiting stenosis. Carotid ultrasound did not provide additional information.  Impression:  Acute ischemic stroke right cerebellum. Chronic ischemic strokes in multiple vascular territories. Chronic antiplatelet therapy - clopidogrel Implantable loop recorder.   Plan: - Please interrogate patient's loop recorder - Pending. -Recommend vascular imagingwith MRA head and neck/CTA head and neck. -RecommendTTE - Results pending. - LDL 47 < goal of 70. -HbA1c 5.0 at goal. - Aspirin 81mg   daily for 3 weeks. - Continue clopidogrel 75mg . - May begin to gradually reduce blood pressure over the next 3-5 days.   - Continue telemetry monitoring for arrhythmia. - Will continue to follow  Electronically signed by:  , MD Page: 12/11/2020, 11:53 AM

## 2020-12-11 NOTE — Progress Notes (Signed)
Patient was admitted for acute stroke. She has a history of ILR LINQ implantation 08/2017 for cryptogenic stroke.   Per Dr. Graciela Husbands no afib seen on Loop interrogation. Rounding MD, Dr. Mariah Milling, also aware.   Can officionall consult cardiology if there are any other cardiac concerns.  Fariha Goto. PA-C

## 2020-12-11 NOTE — Progress Notes (Signed)
Physical Therapy Treatment Patient Details Name: Melody Ochoa MRN: [id number] DOB: 08-21-1941 Today's Date: 12/11/2020    History of Present Illness 80 y.o. female with medical history significant for history of CVA four years prior treated with tPA, chronic back pain on chronic opiates, ambulant with a cane,, pulmonary fibrosis on home O2 at 2 L, depression, who presents to the emergency room bi EMS following a fall at home.  Patient states that when she awoke several hours earlier she dizzy and her speech was slurred and felt weakness in the right leg.  As she got up she fell onto her right side striking her head, right arm and shoulder. Her husband tried to help her up but she couldn't get up so they called EMS.  The weakness and slurred speech lasted 20 to 30 minutes and then resolved however she does have residual dizziness.  She denied headache or visual disturbance.  Denies palpitations, chest pain, shortness of breath, cough fever or chills.  She arrived to the emergency room more than 4 hours after the symptoms.  Imaging reveals small acute cerebellar infarct.    PT Comments    Pt is able to ambulate much better today and did not have any veering, greatly improved L foot coordination and no stagger stepping.  She had O2 in the mid 90s on 1L on arrival, she maintained 90s on room air with bed mobility and initial movement in the room.  Pt did not feel overly fatigued and was eager to do some walking and show that she was safe to go home.  Ultimately she did much better than yesterday (and than this PT expected) and showed safety with in-home distances and stair negotiation.  She did have gradual drop in O2 during the effort (down to mid/low 80s after over 100 ft of combined walking on room air).  Recommendations changed to HHPT, though pt educated repeatedly to insure she does not over-do-it when she gets home and to respect that she did have a new stroke and is not at her typical baseline.      Follow Up Recommendations  Home health PT     Equipment Recommendations  None recommended by PT    Recommendations for Other Services       Precautions / Restrictions Precautions Precautions: Fall Restrictions Weight Bearing Restrictions: No    Mobility  Bed Mobility Overal bed mobility: Needs Assistance Bed Mobility: Supine to Sit;Sit to Supine Rolling: Min guard   Supine to sit: Min assist;Min guard     General bed mobility comments: Pt did much better this session with mobility and though her R shoulder is still very limited she was able to use it to assist with getting herself to EOB  Transfers Overall transfer level: Modified independent Equipment used: Rolling walker (2 wheeled) Transfers: Sit to/from Stand Sit to Stand: Min guard         General transfer comment: cues for hand placement and sequencing, but pt was able to rise to standing from 3 different height surfaces w/o direct assist or hesitation  Ambulation/Gait Ambulation/Gait assistance: Min assist Gait Distance (Feet): 55 Feet Assistive device: Rolling walker (2 wheeled)       General Gait Details: Pt able to walk >50 ft, then 40 ft X 2.  Pt had greatly improved control with L LE showing nearly symmetrical cadence, no veering today and much improved speed.  She did have fatigue with the repeated activity on room air; sats initially in mid 90s  but after prolonged activity sats slowly dropped down as low as 84%, reapplied 1L at end of session with sats returning to 90s in 1-2 minutes.   Stairs Stairs: Yes Stairs assistance: Min guard Stair Management: Two rails Number of Stairs: 4 General stair comments: Pt did quite well negotiating up/down steps, she did not need any direct assist, only minimal cuing with no LOBs   Wheelchair Mobility    Modified Rankin (Stroke Patients Only)       Balance Overall balance assessment: Needs assistance Sitting-balance support: Feet unsupported;No upper  extremity supported Sitting balance-Leahy Scale: Good     Standing balance support: During functional activity Standing balance-Leahy Scale: Fair Standing balance comment: Pt leaning on walker and able to bear weight through R UE better today, no LOBs                            Cognition Arousal/Alertness: Awake/alert Behavior During Therapy: WFL for tasks assessed/performed Overall Cognitive Status: Within Functional Limits for tasks assessed                                        Exercises      General Comments General comments (skin integrity, edema, etc.): Pt with greatly improved ambulation and balance this date, overall showed ability to manage safely at home with assist.      Pertinent Vitals/Pain Faces Pain Scale: Hurts little more Pain Location: R shoulder    Home Living                      Prior Function            PT Goals (current goals can now be found in the care plan section) Progress towards PT goals: Progressing toward goals    Frequency    7X/week      PT Plan Discharge plan needs to be updated    Co-evaluation              AM-PAC PT "6 Clicks" Mobility   Outcome Measure  Help needed turning from your back to your side while in a flat bed without using bedrails?: None Help needed moving from lying on your back to sitting on the side of a flat bed without using bedrails?: A Little Help needed moving to and from a bed to a chair (including a wheelchair)?: A Little Help needed standing up from a chair using your arms (e.g., wheelchair or bedside chair)?: A Little Help needed to walk in hospital room?: A Little Help needed climbing 3-5 steps with a railing? : A Little 6 Click Score: 19    End of Session Equipment Utilized During Treatment: Oxygen Activity Tolerance: Patient limited by fatigue;Patient limited by pain Patient left: in chair;with call bell/phone within reach Nurse Communication:  Mobility status PT Visit Diagnosis: Other abnormalities of gait and mobility (R26.89);Muscle weakness (generalized) (M62.81);Repeated falls (R29.6);Other symptoms and signs involving the nervous system (R29.898);Dizziness and giddiness (R42)     Time: 1325-1420 PT Time Calculation (min) (ACUTE ONLY): 55 min  Charges:  $Gait Training: 23-37 mins $Therapeutic Activity: 23-37 mins                     Malachi Pro, DPT 12/11/2020, 3:16 PM

## 2020-12-11 NOTE — TOC Initial Note (Addendum)
Transition of Care [facility]) - Initial/Assessment Note    Patient Details  Name: Melody Ochoa MRN: [id number] Date of Birth: [birth year]  [facility]) CM/SW Contact:    Shelbie Ammons, RN Phone Number: 12/11/2020, 11:23 AM  Clinical Narrative:     RNCM met with patient in room. Patient is from home and lives with her husband. She is normally independent with a cane and her husband drives her to appointments. Patient is initially agreeable to skilled placement and reports she has been to [facility] in the past. Discussed reaching out to all area facilities and she is agreeable.  Called back to room by bedside nurse who now reports that she wants to go home. She reports that she does not want to wait on a facility bed, she wants to go home. RNCM discussed home health and she is agreeable and does not have preference as to agency. RNCM notified MD of patient's wishes.  RNCM reached out to [facility] with [facility] and he accepted referral for PT and OT. MD made aware that home health will not be able to start until 1/10.      Expected Discharge Plan: [place] Barriers to Discharge: No Barriers Identified   Patient Goals and CMS Choice        Expected Discharge Plan and Services Expected Discharge Plan: [place]       Living arrangements for the past 2 months: Single Family Home                                      Prior Living Arrangements/Services Living arrangements for the past 2 months: Single Family Home Lives with:: Spouse Patient language and need for interpreter reviewed:: Yes Do you feel safe going back to the place where you live?: Yes      Need for Family Participation in Patient Care: Yes (Comment) Care giver support system in place?: Yes (comment)   Criminal Activity/Legal Involvement Pertinent to Current Situation/Hospitalization: No - Comment as needed  Activities of Daily Living Home Assistive  Devices/Equipment: Walker (specify type),Cane (specify quad or straight),Grab bars in shower ADL Screening (condition at time of admission) Patient's cognitive ability adequate to safely complete daily activities?: Yes Is the patient deaf or have difficulty hearing?: No Does the patient have difficulty seeing, even when wearing glasses/contacts?: No Does the patient have difficulty concentrating, remembering, or making decisions?: No Patient able to express need for assistance with ADLs?: Yes Does the patient have difficulty dressing or bathing?: No Independently performs ADLs?: No Communication: Independent Dressing (OT): Needs assistance Is this a change from baseline?: Pre-admission baseline Grooming: Needs assistance Is this a change from baseline?: Pre-admission baseline Feeding: Independent Bathing: Needs assistance Is this a change from baseline?: Pre-admission baseline Toileting: Needs assistance Is this a change from baseline?: Pre-admission baseline In/Out Bed: Needs assistance Is this a change from baseline?: Pre-admission baseline Walks in Home: Needs assistance Is this a change from baseline?: Pre-admission baseline Does the patient have difficulty walking or climbing stairs?: Yes Weakness of Legs: Both Weakness of Arms/Hands: None  Permission Sought/Granted                  Emotional Assessment Appearance:: Appears stated age Attitude/Demeanor/Rapport: Engaged Affect (typically observed): Appropriate,Calm Orientation: : Oriented to Self,Oriented to Place,Oriented to  Time,Oriented to Situation Alcohol / Substance Use: Not Applicable  Psych Involvement: No (comment)  Admission diagnosis:  Stroke (cerebrum) ([place]) [I63.9] Cerebellar stroke, acute ([place]) [I63.9] Ischemic stroke ([place]) [I63.9] Cerebrovascular accident (CVA), unspecified mechanism ([place]) [I63.9] Patient Active Problem List   Diagnosis Date Noted  . Ischemic stroke ([place]) 12/10/2020  . Chronic  kidney disease, stage 3a ([place]) 12/09/2020  . Chronic respiratory failure with hypoxia ([place]) 12/09/2020  . Fall at home, initial encounter 12/09/2020  . Pain in right arm 12/09/2020  . Memory deficit 09/24/2020  . Pulmonary fibrosis ([place]) 09/14/2020  . Chronic cough 09/14/2020  . Arthritis of left sacroiliac joint 08/26/2020  . Chronic left SI joint pain 08/26/2020  . CKD (chronic kidney disease) 07/23/2020  . HTN (hypertension) 04/23/2020  . Positive fecal immunochemical test   . Angiodysplasia of intestinal tract   . Transient ischemic attack (TIA) 10/08/2019  . Transient right leg weakness 09/30/2019  . Peripheral neuropathy 08/10/2019  . Primary osteoarthritis of left shoulder 07/24/2019  . Elevated blood pressure reading 06/27/2019  . Left shoulder pain 05/04/2019  . Anemia 03/19/2019  . Long term current use of opiate analgesic 01/09/2019  . Foraminal stenosis of lumbar region 11/14/2018  . Chronic pain syndrome 09/12/2018  . SOB (shortness of breath) on exertion 08/11/2018  . Dysphagia 08/11/2018  . Cough 06/30/2018  . Rash 01/16/2018  . Hyperlipidemia 10/18/2017  . History of lumbar surgery 10/04/2017  . Depression, recurrent ([place]) 09/27/2017  . Pulmonary nodules 09/27/2017  . History of CVA (cerebrovascular accident) 09/12/2017  . Cerebellar stroke, acute ([place]) 08/10/2017  . Primary osteoarthritis of left hip 02/28/2017  . Strain of left hip 02/28/2017  . B12 deficiency 12/21/2016  . Generalized OA 05/13/2016  . Status post total replacement of right hip 04/22/2016  . Personal history of colonic polyps   . Greater trochanteric bursitis of both hips 01/09/2016  . Prediabetes 08/04/2015  . Thoracic aortic atherosclerosis ([place]) 06/16/2015  . 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  . UTI (lower urinary tract infection) 11/22/2014  . Low back pain 11/22/2014  . Spondylolisthesis of lumbar region 11/18/2014  . Lumbar radiculopathy 08/21/2014  . Degeneration of  lumbar or lumbosacral intervertebral disc 08/21/2014  . Left hip pain 08/21/2014  . Osteopenia 06/23/2014  . Major depression in remission ([place]) 06/23/2014  . Anxiety 06/23/2014  . Menopause 06/23/2014   PCP:  Burnard Hawthorne, FNP Pharmacy:   [id number] - [place], [place] [address] [place] [address] Phone: [phone] Fax: [phone]     Social Determinants of Health (SDOH) Interventions    Readmission Risk Interventions Readmission Risk Prevention Plan 06/15/2019  Post Dischage Appt Complete  Medication Screening Complete  Transportation Screening Complete  Some recent data might be hidden

## 2020-12-12 DIAGNOSIS — Z7901 Long term (current) use of anticoagulants: Secondary | ICD-10-CM

## 2020-12-12 DIAGNOSIS — I639 Cerebral infarction, unspecified: Secondary | ICD-10-CM | POA: Diagnosis not present

## 2020-12-12 DIAGNOSIS — W19XXXA Unspecified fall, initial encounter: Secondary | ICD-10-CM

## 2020-12-12 DIAGNOSIS — J9611 Chronic respiratory failure with hypoxia: Secondary | ICD-10-CM

## 2020-12-12 DIAGNOSIS — I10 Essential (primary) hypertension: Secondary | ICD-10-CM

## 2020-12-12 DIAGNOSIS — Z8673 Personal history of transient ischemic attack (TIA), and cerebral infarction without residual deficits: Secondary | ICD-10-CM

## 2020-12-12 DIAGNOSIS — Y92009 Unspecified place in unspecified non-institutional (private) residence as the place of occurrence of the external cause: Secondary | ICD-10-CM

## 2020-12-12 LAB — BASIC METABOLIC PANEL
Anion gap: 9 (ref 5–15)
BUN: 16 mg/dL (ref 8–23)
CO2: 28 mmol/L (ref 22–32)
Calcium: 8.7 mg/dL — ABNORMAL LOW (ref 8.9–10.3)
Chloride: 105 mmol/L (ref 98–111)
Creatinine, Ser: 0.92 mg/dL (ref 0.44–1.00)
GFR, Estimated: >60 mL/min (ref >60)
Glucose, Bld: 81 mg/dL (ref 70–99)
Potassium: 3.5 mmol/L (ref 3.5–5.1)
Sodium: 142 mmol/L (ref 135–145)

## 2020-12-12 MED ORDER — DULOXETINE HCL 30 MG PO CPEP: 30.0000 mg | ORAL_CAPSULE | Freq: Every day | ORAL | Status: DC

## 2020-12-12 MED ORDER — PREGABALIN 50 MG PO CAPS: 50.0000 mg | ORAL_CAPSULE | Freq: Every day | ORAL | Status: DC

## 2020-12-12 MED ORDER — AMLODIPINE BESYLATE 5 MG PO TABS: 5.0000 mg | ORAL_TABLET | Freq: Every day | ORAL | 0 refills | Status: DC

## 2020-12-12 MED ORDER — ASPIRIN 81 MG PO TBEC: 81.0000 mg | DELAYED_RELEASE_TABLET | Freq: Every day | ORAL | 0 refills | Status: DC

## 2020-12-12 MED ORDER — ENOXAPARIN SODIUM 60 MG/0.6ML ~~LOC~~ SOLN: 0.5000 mg/kg | SUBCUTANEOUS | Status: DC

## 2020-12-12 MED ORDER — BUPROPION HCL ER (XL) 150 MG PO TB24: 300.0000 mg | ORAL_TABLET | Freq: Every day | ORAL | Status: DC

## 2020-12-12 MED ORDER — FAMOTIDINE 20 MG PO TABS: 20.0000 mg | ORAL_TABLET | Freq: Every day | ORAL | Status: DC

## 2020-12-12 NOTE — Progress Notes (Addendum)
Neurology Progress Note  S: No overnight events or new complaints.   O: Current vital signs: BP (!) 149/69 (BP Location: Left Arm)   Pulse 72   Temp 98.1 F (36.7 C) (Oral)   Resp 20   Wt 90.7 kg   SpO2 92%   BMI 33.28 kg/m  Vital signs in last 24 hours: Temp:  [97.3 F (36.3 C)-98.1 F (36.7 C)] 98.1 F (36.7 C) (01/07 0748) Pulse Rate:  [72-79] 72 (01/07 0748) Resp:  [16-20] 20 (01/07 0748) BP: (136-158)/(68-90) 149/69 (01/07 0748) SpO2:  [87 %-100 %] 92 % (01/07 0748) GENERAL: Awake, alert in NAD HEENT: Normocephalic and atraumatic, dry mm, no LN, no Thyromegally LUNGS: Clear to auscultation bilaterally with no wheezes CV: S1S2 RRR, no m/r/g, equal pulses bilaterally. ABDOMEN: Soft, nontender, nondistended with normoactive BS Ext: warm, well perfused, intact peripheral pulses  NEURO:  Mental Status: AA&Ox3  Language: speech is .  Naming, repetition, fluency, and comprehension intact. Cranial Nerves: PERRL EOMI, visual fields full, no facial asymmetry, facial sensation intact, hearing intact, tongue/uvula/soft palate midline, normal sternocleidomastoid and trapezius muscle strength. No evidence of tongue atrophy or fibrillations Motor: good strength in all extremities Tone: is normal and bulk is normal Sensation- Intact to light touch bilaterally Coordination: FTN intact bilaterally, no ataxia in BLE. Gait- deferred  NIHSS0  Medications  Current Facility-Administered Medications:  .   stroke: mapping our early stages of recovery book, , Does not apply, Once, Athena Masse, MD .  acetaminophen (TYLENOL) tablet 650 mg, 650 mg, Oral, Q4H PRN **OR** acetaminophen (TYLENOL) 160 MG/5ML solution 650 mg, 650 mg, Per Tube, Q4H PRN **OR** acetaminophen (TYLENOL) suppository 650 mg, 650 mg, Rectal, Q4H PRN, Athena Masse, MD .  aspirin EC tablet 81 mg, 81 mg, Oral, Daily, Judd Gaudier V, MD, 81 mg at 12/12/20 0848 .  atorvastatin (LIPITOR) tablet 80 mg, 80 mg, Oral,  q1800, Athena Masse, MD, 80 mg at 12/11/20 1733 .  buPROPion (WELLBUTRIN XL) 24 hr tablet 300 mg, 300 mg, Oral, Daily, Pahwani, Rinka R, MD, 300 mg at 12/12/20 0848 .  clopidogrel (PLAVIX) tablet 75 mg, 75 mg, Oral, Daily, Athena Masse, MD, 75 mg at 12/12/20 0849 .  DULoxetine (CYMBALTA) DR capsule 30 mg, 30 mg, Oral, Daily, Pahwani, Rinka R, MD, 30 mg at 12/12/20 0849 .  enoxaparin (LOVENOX) injection 40 mg, 40 mg, Subcutaneous, Q24H, Athena Masse, MD, 40 mg at 12/12/20 0804 .  famotidine (PEPCID) tablet 20 mg, 20 mg, Oral, QHS, Pahwani, Rinka R, MD, 20 mg at 12/11/20 2235 .  pregabalin (LYRICA) capsule 50 mg, 50 mg, Oral, Daily, 50 mg at 12/12/20 0849 **AND** pregabalin (LYRICA) capsule 100 mg, 100 mg, Oral, QHS, Pahwani, Rinka R, MD, 100 mg at 12/11/20 2234 .  senna-docusate (Senokot-S) tablet 1 tablet, 1 tablet, Oral, QHS PRN, Athena Masse, MD Labs CBC    Component Value Date/Time   WBC 6.9 12/11/2020 0342   RBC 3.56 (L) 12/11/2020 0342   HGB 10.2 (L) 12/11/2020 0342   HGB 8.6 (L) 12/02/2014 0610   HCT 31.5 (L) 12/11/2020 0342   HCT 26.7 (L) 12/02/2014 0610   PLT 235 12/11/2020 0342   PLT 372 12/02/2014 0610   MCV 88.5 12/11/2020 0342   MCV 87 12/02/2014 0610   MCH 28.7 12/11/2020 0342   MCHC 32.4 12/11/2020 0342   RDW 14.3 12/11/2020 0342   RDW 15.1 (H) 12/02/2014 0610   LYMPHSABS 0.8 12/09/2020 1309   LYMPHSABS 1.4  12/02/2014 0610   MONOABS 0.5 12/09/2020 1309   MONOABS 0.6 12/02/2014 0610   EOSABS 0.2 12/09/2020 1309   EOSABS 0.4 12/02/2014 0610   BASOSABS 0.1 12/09/2020 1309   BASOSABS 0.1 12/02/2014 0610    CMP     Component Value Date/Time   NA 142 12/12/2020 0651   K 3.5 12/12/2020 0651   CL 105 12/12/2020 0651   CO2 28 12/12/2020 0651   GLUCOSE 81 12/12/2020 0651   BUN 16 12/12/2020 0651   CREATININE 0.92 12/12/2020 0651   CALCIUM 8.7 (L) 12/12/2020 0651   PROT CANCELED 05/21/2020 1332   ALBUMIN 4.0 04/22/2020 1119   AST 17 04/22/2020 1119    ALT 13 04/22/2020 1119   ALKPHOS 62 04/22/2020 1119   BILITOT 0.4 04/22/2020 1119   GFRNONAA >60 12/12/2020 0651   GFRAA 59 (L) 03/11/2020 0931   Lipid Panel     Component Value Date/Time   CHOL 118 12/10/2020 0459   TRIG 70 12/10/2020 0459   HDL 57 12/10/2020 0459   CHOLHDL 2.1 12/10/2020 0459   VLDL 14 12/10/2020 0459   LDLCALC 47 12/10/2020 0459   Imaging I have reviewed the images obtained: NCT head showed did not show acute ischemic changes. MRI brain showed small acute right cerebellar infarct. Chronic infarcts in right frontal lobe, left occipital-parietal and left cerebellum. CTA head and neck showed no large or medium vessel occlusion, 30-50% stenosis of the proximal left subclavian artery. Atherosclerotic disease at both carotid bifurcations but without stenosis on the right; 20% stenosis of the proximal ICA bulb on the left. No significant posterior circulation pathology/flow limiting stenosis. Carotid ultrasound mild carotid atherosclerosis at the bifurcations without signs of stenosis. Patent vertebral arteries with normal flow.  Assessment: Melody Ochoa is a 80 y.o. female right handed PMHx chronic strokes in multiple territories with acute right cerebellar stroke. She does not have significant vascular risk factors and pattern of lesions on MRI suggests embolic from central source; however, to date cardiac workup has been negative. CTA head and neck did not show significant flow limiting stenosis. Carotid ultrasound did not provide additional information.  Stroke workup was again unrevealing. Pattern of strokes highly suggestive of central source however there is not enough evidence to start anticoagulation at this point. She will need further outpatient neuroogy evaluation for cryptogenic stroke.  Impression:  Acute ischemic stroke right cerebellum. Chronic ischemic strokes in multiple vascular territories. Chronic antiplatelet therapy - clopidogrel Implantable  loop recorder.   Plan: - Loop recorder interrogated - No irregular rhythm. -CTA head and neck - Did not show posterior circulation source. -RecommendTTE - EF ~55 %, no LV wall motion abnormalities, PFO, LV thrombus. - LDL 47 < goal of 70. -HbA1c 5.0 at goal. - Aspirin 81mg   daily for 3 weeks. - Continue clopidogrel 75mg . - Blood pressure target <140/90.  - Will need further outpatient evaluation for cryptogenic stroke with cardiology and neurology. - Patient requested to be referred to outpatient cardiology and neurology to establish care. - Please call for questions.   Electronically signed by:  Lynnae Sandhoff, MD Page: [phone] 12/12/2020, 9:03 AM

## 2020-12-12 NOTE — Progress Notes (Signed)
PHARMACIST - PHYSICIAN COMMUNICATION  CONCERNING:  Enoxaparin (Lovenox) for DVT Prophylaxis    RECOMMENDATION: Patient was prescribed enoxaprin 40mg  q24 hours for VTE prophylaxis.   Filed Weights   12/10/20 0009  Weight: 90.7 kg (200 lb)    Body mass index is 33.28 kg/m.  Estimated Creatinine Clearance: 54.3 mL/min (by C-G formula based on SCr of 0.92 mg/dL).   Based on [place] patient is candidate for enoxaparin 0.5mg /kg TBW SQ every 24 hours based on BMI being >30.   DESCRIPTION: Pharmacy has adjusted enoxaparin dose per [facility] policy.  Patient is now receiving enoxaparin 45 mg every 24 hours    Lu Duffel, PharmD Clinical Pharmacist  12/12/2020 10:41 AM

## 2020-12-12 NOTE — Plan of Care (Signed)
Pt resting in bed after lunch,NAD noted.Pt able to communicate needs,denies any pain or distress.Appetite and fluid intake good.cont of B&B,ambulates to the RR with RW. Pt dc Home with PT services.Pt expressed understanding of discharged orders.no verbal c/o or any distress.

## 2020-12-12 NOTE — Discharge Summary (Signed)
Physician Discharge Summary  Melody Ochoa [name] DOB: 1941-02-11 DOA: 12/09/2020  PCP: Burnard Hawthorne, FNP  Admit date: 12/09/2020 Discharge date: 12/12/2020  Admitted From: Home Disposition: Home with home health services  Recommendations for Outpatient Follow-up:  1. Follow-up with PCP in 1 week 2. Repeat CBC and BMP on follow-up visit 3. Continue aspirin for 3 weeks and then Plavix alone 4. Follow-up with cardiology and neurology outpatient.  5. Start amlodipine 5 mg daily 6. Monitor blood pressure at home  Home Health: Yes PT/OT Equipment/Devices: None Discharge Condition: Stable CODE STATUS: Full code Diet recommendation: Low-sodium diet  Brief/Interim Summary: Melody Ochoa a 80 y.o.femalewith medical history significant forhistory of CVAfour years prior treated with tPA, chronic back pain on chronic opiates, ambulant with a cane,pulmonary fibrosis on home O2 at 2 L, depression, who presents to the emergency roombi EMS following a fall at home. Patient states that when she awoke several hours earlier she dizzyand her speech was slurred and felt weakness in the right leg. As she got up she fell onto her right side striking her head, right arm and shoulder. Her husband tried to help her up but she couldn't get up so they called EMS.The weakness and slurred speech lasted 20 to 30 minutes and then resolvedhowever she does have residual dizziness.  ED Course:On arrival, BP 188/91, pulse 80, O2 sat 92% on room air. Afebrile. She had an extensive trauma work-up to include CT scan of the head, C-spine as well as right shoulder and humerus which showed no acute injury. Her blood work was unremarkable. Creatinine 1.28 which is near her baseline of 1.18. Hemoglobin 10.4 close to the baseline of 11.7. The emergency room provider spoke with neurologist, Dr. Theda Sers who recommended getting an MRI with discharge if negative and admission if positive. MRI was  subsequently done which revealed "Small acute right cerebellar infarct.Multiple chronic infarcts and chronic microvascular ischemic changes as seen previously".  Patient admitted for further stroke work-up.  Acute cerebellar stroke: -History of CVA, history of loop recorder implantation on 9/18 for cryptogenic stroke. -Patient presented with an episode of weakness, dizziness resulting in a fall at home with no acute injury on imaging studies.  Episode lasted for 30 minutes with no residual focal neurological deficit. -Reviewed MRI brain, CT head and CT cervical spine.  -Patient arrived outside for TPA window. -Carotid ultrasound shows mild carotid atherosclerosis at the bifurcation without signs of restenosis.  Patent vertebral arteries with normal flow. -Continued atorvastatin and Plavix.  Started on aspirin 81 mg daily. -A1c: 5.0%, lipid panel: LDL: 47, total cholesterol: 118. -Echo:  Ejection fraction 55%, no LV wall motion abnormalities, PA for LV thrombus.   -CTA head and neck shows no large or medium vessel occlusion. -Device interrogation done by cardiology shows no A. fib seen on loop interrogation. -PT/OT recommended SNF however patient wishes to go home.  Home health services arranged at the time of discharge.  Right arm/right shoulder pain: Status post fall at home. -X-ray of right shoulder and arm: Negative for acute findings. -Continued as needed pain medications.    Hypertension: -Patient tells me that she does not take any medications for her blood pressure at home.  Her blood pressure remains elevated while in the hospital.  We will discharge patient on amlodipine 5 mg daily.  Recommended to check blood pressure every day at home and follow-up with PCP.  Chronic anemia:  H&H remained stable  Pulmonary fibrosis/chronic respiratory failure with hypoxia: Patient uses  oxygen-2 L at baseline at home. -Continued albuterol as needed.  CKD stage IIIa: -Kidney function  improved.  Asymptomatic bacteriuria: Patient's urine is positive for rare bacteria.  Patient denies urinary symptoms.  No indication of antibiotics at this time.    She remained afebrile with no leukocytosisObesity with BMI of 33: Diet modification/exercise and weight loss recommended.  Long-term current use of opiate analgesic for chronic pain: Continued home analgesics.  Discharge Diagnoses:  Acute right cerebellar infarct Right arm/right shoulder pain Hypertension Chronic anemia Pulmonary fibrosis/chronic respiratory failure with hypoxia CKD stage IIIa Asymptomatic bacteriuria Obesity with BMI of 33 Long-term current use of opiate analgesic for chronic pain  Discharge Instructions  Discharge Instructions    Discharge instructions   Complete by: As directed    Follow-up with PCP in 1 week Repeat CBC and BMP on follow-up visit Continue aspirin for 3 weeks and then Plavix alone Follow-up with cardiology and neurology outpatient.   Increase activity slowly   Complete by: As directed    No wound care   Complete by: As directed      Allergies as of 12/12/2020      Reactions   Latex Itching   Aleve [naproxen Sodium] Other (See Comments)   Gi upset   Aspirin Other (See Comments)   Stomach pain-aggravates diverticulosis   Celebrex [celecoxib] Other (See Comments)   Dizziness   Effexor [venlafaxine] Other (See Comments)   Hot flashes   Gabapentin Other (See Comments)   "Night terrors"   Hydrocodone-homatropine Diarrhea   Ibuprofen Other (See Comments)   Gi upset   Mobic [meloxicam]    Stomach upset   Tape Itching, Other (See Comments)   Itchy blisters   Vioxx [rofecoxib] Other (See Comments)   Gi upset   Other Rash, Other (See Comments)   bandaides   Tramadol Other (See Comments)   hallucinations      Medication List    TAKE these medications   albuterol 108 (90 Base) MCG/ACT inhaler Commonly known as: VENTOLIN HFA TAKE 2 PUFFS BY MOUTH EVERY 6 HOURS AS  NEEDED FOR WHEEZE OR SHORTNESS OF BREATH   amLODipine 5 MG tablet Commonly known as: NORVASC Take 1 tablet (5 mg total) by mouth daily.   aspirin 81 MG EC tablet Take 1 tablet (81 mg total) by mouth daily for 21 days. Swallow whole. Start taking on: December 13, 2020   atorvastatin 80 MG tablet Commonly known as: LIPITOR TAKE 1 TABLET BY MOUTH DAILY AT 6 PM. NEEDS OFFICE VISIT FOR FURTHER REFILLS. 2ND ATTEMPT What changed: See the new instructions.   buPROPion 300 MG 24 hr tablet Commonly known as: WELLBUTRIN XL Take 300 mg by mouth daily.   clopidogrel 75 MG tablet Commonly known as: PLAVIX TAKE 1 TABLET BY MOUTH EVERY DAY   DULoxetine 30 MG capsule Commonly known as: CYMBALTA Take 30 mg by mouth daily.   famotidine 20 MG tablet Commonly known as: PEPCID Take 20 mg by mouth at bedtime.   fluticasone 50 MCG/ACT nasal spray Commonly known as: FLONASE SPRAY 2 SPRAYS INTO EACH NOSTRIL EVERY DAY What changed: See the new instructions.   furosemide 20 MG tablet Commonly known as: LASIX Take 1 tablet (20 mg) by mouth once daily x 3 days, then take 1 tablet (20 mg) once daily as needed for swelling   mupirocin ointment 2 % Commonly known as: BACTROBAN APPLY A SMALL AMOUNT TO AFFECTED AREA ONCE A DAY TO EXCISION SITE   pregabalin 50 MG capsule  Commonly known as: Lyrica 50 mg during day, 100 mg at night       Follow-up Information    Burnard Hawthorne, FNP Follow up in 1 week(s).   Specialty: Family Medicine Contact information: [address] [place] [address] [phone]        Deboraha Sprang, MD Follow up in 2 week(s).   Specialty: Cardiology Contact information: [id number] [place] [address] [phone]        [facility] Follow up in 2 week(s).              Allergies  Allergen Reactions  . Latex Itching  . Aleve [Naproxen Sodium] Other (See Comments)    Gi upset  . Aspirin Other (See Comments)     Stomach pain-aggravates diverticulosis  . Celebrex [Celecoxib] Other (See Comments)    Dizziness   . Effexor [Venlafaxine] Other (See Comments)    Hot flashes   . Gabapentin Other (See Comments)    "Night terrors"  . Hydrocodone-Homatropine Diarrhea  . Ibuprofen Other (See Comments)    Gi upset   . Mobic [Meloxicam]     Stomach upset  . Tape Itching and Other (See Comments)    Itchy blisters  . Vioxx [Rofecoxib] Other (See Comments)    Gi upset  . Other Rash and Other (See Comments)    bandaides  . Tramadol Other (See Comments)    hallucinations    Consultations:  Neurology   Procedures/Studies: CT ANGIO HEAD W OR WO CONTRAST  Result Date: 12/10/2020 CLINICAL DATA:  Acute right cerebellar infarction. EXAM: CT ANGIOGRAPHY HEAD AND NECK TECHNIQUE: Multidetector CT imaging of the head and neck was performed using the standard protocol during bolus administration of intravenous contrast. Multiplanar CT image reconstructions and MIPs were obtained to evaluate the vascular anatomy. Carotid stenosis measurements (when applicable) are obtained utilizing NASCET criteria, using the distal internal carotid diameter as the denominator. CONTRAST:  62mL OMNIPAQUE IOHEXOL 350 MG/ML SOLN COMPARISON:  MRI yesterday. FINDINGS: CT HEAD FINDINGS Brain: Old left cerebellar infarction. Chronic small-vessel ischemic changes of the hemispheric white matter. Old right frontal and left parietal cortical and subcortical infarctions. No mass lesion, hemorrhage, hydrocephalus or extra-axial collection. Vascular: There is atherosclerotic calcification of the major vessels at the base of the brain. Skull: Negative Sinuses: Clear Orbits: Normal Review of the MIP images confirms the above findings CTA NECK FINDINGS Aortic arch: Aortic atherosclerosis. Branching pattern is normal. 30-50% stenosis of the proximal left subclavian artery. Right carotid system: Common carotid artery widely patent to the bifurcation.  Calcified plaque at the carotid bifurcation but without stenosis. Cervical ICA widely patent. Left carotid system: Common carotid artery widely patent to the bifurcation region. Calcified plaque at the carotid bifurcation and ICA bulb. 20% stenosis of the proximal ICA bulb. More distal cervical ICA widely patent. Vertebral arteries: Both vertebral artery origins are widely patent. Both vertebral arteries are patent through the cervical region to the foramen magnum. Skeleton: Pronounced cervical spondylosis and facet arthropathy. Previous posterior decompression and fusion at C5-6. Other neck: No mass or lymphadenopathy. Upper chest: Chronic lung disease with emphysema, scarring and bronchiectasis. No identifiable acute pneumonia. Review of the MIP images confirms the above findings CTA HEAD FINDINGS Anterior circulation: Both internal carotid arteries are patent through the skull base and siphon regions. There is siphon atherosclerotic calcification but no stenosis greater than 30%. The anterior and middle cerebral vessels are patent. No large or medium vessel  occlusion. Posterior circulation: Both vertebral arteries are patent through the foramen magnum to the basilar. No basilar stenosis. Posterior circulation branch vessels are patent. Primary anterior circulation supply to the posterior cerebral arteries. Venous sinuses: Patent and normal. Anatomic variants: None significant. Review of the MIP images confirms the above findings IMPRESSION: 1. No large or medium vessel occlusion. 2. Aortic atherosclerosis. 30-50% stenosis of the proximal left subclavian artery. 3. Atherosclerotic disease at both carotid bifurcations but without stenosis on the right. 20% stenosis of the proximal ICA bulb on the left. 4. No significant posterior circulation pathology identified. 5. Chronic lung disease with emphysema, scarring and bronchiectasis. No identifiable acute pneumonia. Aortic Atherosclerosis (ICD10-I70.0) and Emphysema  (ICD10-J43.9). Electronically Signed   By: Nelson Chimes M.D.   On: 12/10/2020 15:22   DG Shoulder Right  Result Date: 12/09/2020 CLINICAL DATA:  Right shoulder and upper arm pain after trip and fall over a rug today. Initial encounter. EXAM: RIGHT SHOULDER - 2+ VIEW COMPARISON:  None. FINDINGS: There is no evidence of fracture or dislocation. Mild-to-moderate acromioclavicular and glenohumeral osteoarthritis noted. Soft tissues are unremarkable. IMPRESSION: No acute abnormality. Electronically Signed   By: Inge Rise M.D.   On: 12/09/2020 13:52   CT Head Wo Contrast  Result Date: 12/09/2020 CLINICAL DATA:  Head injury after fall. EXAM: CT HEAD WITHOUT CONTRAST TECHNIQUE: Contiguous axial images were obtained from the base of the skull through the vertex without intravenous contrast. COMPARISON:  September 17, 2020. FINDINGS: Brain: Mild diffuse cortical atrophy is noted. Old right frontal and posterior parietal infarctions are noted. No mass effect or midline shift is noted. Ventricular size is within normal limits. There is no evidence of mass lesion, hemorrhage or acute infarction. Vascular: No hyperdense vessel or unexpected calcification. Skull: Normal. Negative for fracture or focal lesion. Sinuses/Orbits: No acute finding. Other: None. IMPRESSION: Mild diffuse cortical atrophy. Old right frontal and posterior parietal infarctions. No acute intracranial abnormality seen. Electronically Signed   By: Marijo Conception M.D.   On: 12/09/2020 15:37   CT ANGIO NECK W OR WO CONTRAST  Result Date: 12/10/2020 CLINICAL DATA:  Acute right cerebellar infarction. EXAM: CT ANGIOGRAPHY HEAD AND NECK TECHNIQUE: Multidetector CT imaging of the head and neck was performed using the standard protocol during bolus administration of intravenous contrast. Multiplanar CT image reconstructions and MIPs were obtained to evaluate the vascular anatomy. Carotid stenosis measurements (when applicable) are obtained utilizing  NASCET criteria, using the distal internal carotid diameter as the denominator. CONTRAST:  69mL OMNIPAQUE IOHEXOL 350 MG/ML SOLN COMPARISON:  MRI yesterday. FINDINGS: CT HEAD FINDINGS Brain: Old left cerebellar infarction. Chronic small-vessel ischemic changes of the hemispheric white matter. Old right frontal and left parietal cortical and subcortical infarctions. No mass lesion, hemorrhage, hydrocephalus or extra-axial collection. Vascular: There is atherosclerotic calcification of the major vessels at the base of the brain. Skull: Negative Sinuses: Clear Orbits: Normal Review of the MIP images confirms the above findings CTA NECK FINDINGS Aortic arch: Aortic atherosclerosis. Branching pattern is normal. 30-50% stenosis of the proximal left subclavian artery. Right carotid system: Common carotid artery widely patent to the bifurcation. Calcified plaque at the carotid bifurcation but without stenosis. Cervical ICA widely patent. Left carotid system: Common carotid artery widely patent to the bifurcation region. Calcified plaque at the carotid bifurcation and ICA bulb. 20% stenosis of the proximal ICA bulb. More distal cervical ICA widely patent. Vertebral arteries: Both vertebral artery origins are widely patent. Both vertebral arteries are patent through the cervical  region to the foramen magnum. Skeleton: Pronounced cervical spondylosis and facet arthropathy. Previous posterior decompression and fusion at C5-6. Other neck: No mass or lymphadenopathy. Upper chest: Chronic lung disease with emphysema, scarring and bronchiectasis. No identifiable acute pneumonia. Review of the MIP images confirms the above findings CTA HEAD FINDINGS Anterior circulation: Both internal carotid arteries are patent through the skull base and siphon regions. There is siphon atherosclerotic calcification but no stenosis greater than 30%. The anterior and middle cerebral vessels are patent. No large or medium vessel occlusion. Posterior  circulation: Both vertebral arteries are patent through the foramen magnum to the basilar. No basilar stenosis. Posterior circulation branch vessels are patent. Primary anterior circulation supply to the posterior cerebral arteries. Venous sinuses: Patent and normal. Anatomic variants: None significant. Review of the MIP images confirms the above findings IMPRESSION: 1. No large or medium vessel occlusion. 2. Aortic atherosclerosis. 30-50% stenosis of the proximal left subclavian artery. 3. Atherosclerotic disease at both carotid bifurcations but without stenosis on the right. 20% stenosis of the proximal ICA bulb on the left. 4. No significant posterior circulation pathology identified. 5. Chronic lung disease with emphysema, scarring and bronchiectasis. No identifiable acute pneumonia. Aortic Atherosclerosis (ICD10-I70.0) and Emphysema (ICD10-J43.9). Electronically Signed   By: Nelson Chimes M.D.   On: 12/10/2020 15:22   CT Cervical Spine Wo Contrast  Result Date: 12/09/2020 CLINICAL DATA:  80 year old female with neck trauma. EXAM: CT CERVICAL SPINE WITHOUT CONTRAST TECHNIQUE: Multidetector CT imaging of the cervical spine was performed without intravenous contrast. Multiplanar CT image reconstructions were also generated. COMPARISON:  Cervical spine CT dated 09/17/2020. FINDINGS: Alignment: No acute subluxation. Skull base and vertebrae: No acute fracture. C5-C6 decompression and fusion. Soft tissues and spinal canal: No prevertebral fluid or swelling. No visible canal hematoma. Disc levels:  Multilevel degenerative changes. Upper chest: Biapical linear scarring. Other: None IMPRESSION: 1. No acute/traumatic cervical spine pathology. 2. C5-C6 decompression and fusion. Electronically Signed   By: Anner Crete M.D.   On: 12/09/2020 17:43   MR BRAIN WO CONTRAST  Result Date: 12/09/2020 CLINICAL DATA:  TIA EXAM: MRI HEAD WITHOUT CONTRAST TECHNIQUE: Multiplanar, multiecho pulse sequences of the brain and  surrounding structures were obtained without intravenous contrast. COMPARISON:  09/30/2019 FINDINGS: Brain: Small subcentimeter focus of restricted diffusion is present in the right cerebellum. Multiple chronic infarcts are again identified including involvement of the right frontal lobe, left occipitoparietal lobe, and left cerebellum. There are chronic blood products associated with the areas of chronic infarction. Additional focus of susceptibility in the inferior right parietal lobe likely reflects a chronic microhemorrhage. Other patchy T2 hyperintensity in the supratentorial white matter is nonspecific but most likely reflects stable mild chronic microvascular ischemic changes. Prominence of the ventricles and sulci reflects stable parenchymal volume loss. There is no intracranial mass or mass effect. There is no hydrocephalus or extra-axial fluid collection. Vascular: Major vessel flow voids at the skull base are preserved. Skull and upper cervical spine: Normal marrow signal is preserved. Sinuses/Orbits: Paranasal sinuses are aerated. Bilateral lens replacements. Other: Sella is unremarkable.  Mastoid air cells are clear. IMPRESSION: Small acute right cerebellar infarct. Multiple chronic infarcts and chronic microvascular ischemic changes as seen previously. Electronically Signed   By: Macy Mis M.D.   On: 12/09/2020 20:09   US Carotid Bilateral (at [facility] and [facility] only)  Result Date: 12/10/2020 CLINICAL DATA:  Stroke workup EXAM: BILATERAL CAROTID DUPLEX ULTRASOUND TECHNIQUE: Pearline Cables scale imaging, color Doppler and duplex ultrasound were performed of bilateral carotid  and vertebral arteries in the neck. COMPARISON:  06/13/2019 FINDINGS: Criteria: Quantification of carotid stenosis is based on velocity parameters that correlate the residual internal carotid diameter with NASCET-based stenosis levels, using the diameter of the distal internal carotid lumen as the denominator for stenosis measurement. The  following velocity measurements were obtained: RIGHT ICA: 69/20 cm/sec CCA: 68/34 cm/sec SYSTOLIC ICA/CCA RATIO:  0.9 ECA: 61 cm/sec LEFT ICA: 84/23 cm/sec CCA: 19/62 cm/sec SYSTOLIC ICA/CCA RATIO:  1.3 ECA: 92 cm/sec RIGHT CAROTID ARTERY: Mild atheromatous plaque seen at the bifurcation. RIGHT VERTEBRAL ARTERY:  Antegrade flow with normal waveform LEFT CAROTID ARTERY: Mild atheromatous plaque seen at the bifurcation. LEFT VERTEBRAL ARTERY:  Antegrade flow with normal waveform IMPRESSION: 1. Mild carotid atherosclerosis at the bifurcations without signs of stenosis. 2. Patent vertebral arteries with normal flow. Electronically Signed   By: Monte Fantasia M.D.   On: 12/10/2020 06:50   DG Humerus Right  Result Date: 12/09/2020 CLINICAL DATA:  Right shoulder and upper arm pain after trip and fall over a rug today. Initial encounter. EXAM: RIGHT HUMERUS - 2+ VIEW COMPARISON:  None. FINDINGS: There is no evidence of fracture or other focal bone lesions. Soft tissues are unremarkable. IMPRESSION: Negative exam. Electronically Signed   By: Inge Rise M.D.   On: 12/09/2020 13:52   ECHOCARDIOGRAM COMPLETE  Result Date: 12/11/2020    ECHOCARDIOGRAM REPORT   Patient Name:   Melody Ochoa Date of Exam: 12/10/2020 Medical Rec #:  [id number]        Height:       65.0 in Accession #:    [id number]       Weight:       200.0 lb Date of Birth:  1941/09/01        BSA:          1.978 m Patient Age:    35 years         BP:           188/91 mmHg Patient Gender: F                HR:           89 bpm. Exam Location:  [facility] Procedure: 2D Echo, Cardiac Doppler and Color Doppler Indications:     I163.9 Stroke  History:         Patient has prior history of Echocardiogram examinations, most                  recent 08/25/2020. Risk Factors:Dyslipidemia. Stroke. Murmur.                  Dizziness.  Sonographer:     Wilford Sports Rodgers-Jones Referring Phys:  [id number] Mckinley Jewel Diagnosing Phys: Ida Rogue MD IMPRESSIONS  1. Left  ventricular ejection fraction, by estimation, is 55 %. The left ventricle has normal function. The left ventricle has no regional wall motion abnormalities. Left ventricular diastolic parameters are consistent with Grade I diastolic dysfunction (impaired relaxation).  2. Right ventricular systolic function is normal. The right ventricular size is normal.  3. Left atrial size was mild to moderately dilated.  4. The mitral valve is normal in structure. Mild mitral valve regurgitation. FINDINGS  Left Ventricle: Left ventricular ejection fraction, by estimation, is 55 %. The left ventricle has normal function. The left ventricle has no regional wall motion abnormalities. The left ventricular internal cavity size was normal in size. There is no left ventricular hypertrophy. Left ventricular diastolic parameters are  consistent with Grade I diastolic dysfunction (impaired relaxation). Right Ventricle: The right ventricular size is normal. No increase in right ventricular wall thickness. Right ventricular systolic function is normal. Left Atrium: Left atrial size was mild to moderately dilated. Right Atrium: Right atrial size was normal in size. Pericardium: There is no evidence of pericardial effusion. Mitral Valve: The mitral valve is normal in structure. Mild mitral valve regurgitation. No evidence of mitral valve stenosis. Tricuspid Valve: The tricuspid valve is normal in structure. Tricuspid valve regurgitation is not demonstrated. No evidence of tricuspid stenosis. Aortic Valve: The aortic valve is normal in structure. Aortic valve regurgitation is not visualized. Mild aortic valve sclerosis is present, with no evidence of aortic valve stenosis. Pulmonic Valve: The pulmonic valve was normal in structure. Pulmonic valve regurgitation is not visualized. No evidence of pulmonic stenosis. Aorta: The aortic root is normal in size and structure. Venous: The inferior vena cava is normal in size with greater than 50%  respiratory variability, suggesting right atrial pressure of 3 mmHg. IAS/Shunts: No atrial level shunt detected by color flow Doppler.  LEFT VENTRICLE PLAX 2D LVIDd:         5.44 cm Diastology LVIDs:         3.81 cm LV e' medial:    4.68 cm/s LV PW:         0.76 cm LV E/e' medial:  15.7 LV IVS:        0.70 cm LV e' lateral:   6.96 cm/s                        LV E/e' lateral: 10.6  RIGHT VENTRICLE             IVC RV Basal diam:  3.19 cm     IVC diam: 1.59 cm RV S prime:     11.10 cm/s TAPSE (M-mode): 2.3 cm LEFT ATRIUM             Index       RIGHT ATRIUM           Index LA diam:        5.20 cm 2.63 cm/m  RA Area:     10.30 cm LA Vol (A2C):   55.1 ml 27.85 ml/m RA Volume:   22.30 ml  11.27 ml/m LA Vol (A4C):   50.5 ml 25.53 ml/m LA Biplane Vol: 52.8 ml 26.69 ml/m   AORTA Ao Root diam: 2.90 cm MITRAL VALVE MV Area (PHT): 2.76 cm MV Decel Time: 275 msec MV E velocity: 73.45 cm/s MV A velocity: 102.50 cm/s MV E/A ratio:  0.72 Ida Rogue MD Electronically signed by Ida Rogue MD Signature Date/Time: 12/11/2020/3:23:16 PM    Final        Subjective:  Patient seen and examined.  Sitting comfortably on the bed eating breakfast.  Tells me that she feels better and has no new complaints and wishes to go home.  Denies headache, blurry vision, chest pain, shortness of breath, palpitation, leg swelling, fever or chills.  Discussed about elevated blood pressure-she agreed to start on antihypertensive medication.  Discharge Exam: Vitals:   12/12/20 0427 12/12/20 0748  BP: (!) 158/90 (!) 149/69  Pulse: 73 72  Resp: 16 20  Temp: 97.8 F (36.6 C) 98.1 F (36.7 C)  SpO2: 100% 92%   Vitals:   12/11/20 2317 12/12/20 0349 12/12/20 0427 12/12/20 0748  BP: (!) 146/73  (!) 158/90 (!) 149/69  Pulse: 78 75 73  72  Resp: 18  16 20   Temp: 98 F (36.7 C)  97.8 F (36.6 C) 98.1 F (36.7 C)  TempSrc:   Oral Oral  SpO2: (!) 87% 95% 100% 92%  Weight:        General: Pt is alert, awake, not in acute  distress, obese, on 2 L of oxygen via nasal cannula, communicating well Cardiovascular: RRR, S1/S2 +, no rubs, no gallops Respiratory: CTA bilaterally, no wheezing, no rhonchi Abdominal: Soft, NT, ND, bowel sounds + Extremities: no edema, no cyanosis    The results of significant diagnostics from this hospitalization (including imaging, microbiology, ancillary and laboratory) are listed below for reference.     Microbiology: Recent Results (from the past 240 hour(s))  SARS CORONAVIRUS 2 (TAT 6-24 HRS) Nasopharyngeal Nasopharyngeal Swab     Status: None   Collection Time: 12/09/20  9:17 PM   Specimen: Nasopharyngeal Swab  Result Value Ref Range Status   SARS Coronavirus 2 NEGATIVE NEGATIVE Final    Comment: (NOTE) SARS-CoV-2 target nucleic acids are NOT DETECTED.  The SARS-CoV-2 RNA is generally detectable in upper and lower respiratory specimens during the acute phase of infection. Negative results do not preclude SARS-CoV-2 infection, do not rule out co-infections with other pathogens, and should not be used as the sole basis for treatment or other patient management decisions. Negative results must be combined with clinical observations, patient history, and epidemiological information. The expected result is Negative.  Fact Sheet for Patients: SugarRoll.be  Fact Sheet for Healthcare Providers: https://www.woods-mathews.com/  This test is not yet approved or cleared by the [place] FDA and  has been authorized for detection and/or diagnosis of SARS-CoV-2 by FDA under an Emergency Use Authorization (EUA). This EUA will remain  in effect (meaning this test can be used) for the duration of the COVID-19 declaration under Se ction 564(b)(1) of the Act, 21 U.S.C. section 360bbb-3(b)(1), unless the authorization is terminated or revoked sooner.  Performed at [facility], [place] [address], [place], [place] [address]       Labs: BNP (last 3 results) No results for input(s): BNP in the last 8760 hours. Basic Metabolic Panel: Recent Labs  Lab 12/09/20 1309 12/09/20 2149 12/11/20 0342 12/12/20 0651  NA 139  --  140 142  K 4.2  --  3.8 3.5  CL 104  --  104 105  CO2 27  --  27 28  GLUCOSE 93  --  84 81  BUN 15  --  17 16  CREATININE 1.28* 1.13* 0.99 0.92  CALCIUM 9.1  --  8.8* 8.7*   Liver Function Tests: No results for input(s): AST, ALT, ALKPHOS, BILITOT, PROT, ALBUMIN in the last 168 hours. No results for input(s): LIPASE, AMYLASE in the last 168 hours. No results for input(s): AMMONIA in the last 168 hours. CBC: Recent Labs  Lab 12/09/20 1309 12/09/20 2149 12/11/20 0342  WBC 7.2 6.4 6.9  NEUTROABS 5.6  --   --   HGB 10.4* 10.8* 10.2*  HCT 32.7* 33.7* 31.5*  MCV 89.6 88.9 88.5  PLT 224 237 235   Cardiac Enzymes: No results for input(s): CKTOTAL, CKMB, CKMBINDEX, TROPONINI in the last 168 hours. BNP: Invalid input(s): POCBNP CBG: No results for input(s): GLUCAP in the last 168 hours. D-Dimer No results for input(s): DDIMER in the last 72 hours. Hgb A1c Recent Labs    12/10/20 0459  HGBA1C 5.0   Lipid Profile Recent Labs    12/10/20 0459  CHOL  118  HDL 57  LDLCALC 47  TRIG 70  CHOLHDL 2.1   Thyroid function studies No results for input(s): TSH, T4TOTAL, T3FREE, THYROIDAB in the last 72 hours.  Invalid input(s): FREET3 Anemia work up No results for input(s): VITAMINB12, FOLATE, FERRITIN, TIBC, IRON, RETICCTPCT in the last 72 hours. Urinalysis    Component Value Date/Time   COLORURINE YELLOW (A) 09/17/2020 1616   APPEARANCEUR HAZY (A) 09/17/2020 1616   LABSPEC 1.020 09/17/2020 1616   PHURINE 5.0 09/17/2020 1616   GLUCOSEU NEGATIVE 09/17/2020 1616   HGBUR NEGATIVE 09/17/2020 1616   BILIRUBINUR NEGATIVE 09/17/2020 1616   BILIRUBINUR neg 12/13/2019 1537   KETONESUR NEGATIVE 09/17/2020 1616   PROTEINUR NEGATIVE 09/17/2020 1616   UROBILINOGEN 0.2 12/13/2019 1537    UROBILINOGEN 1.0 11/22/2014 0033   NITRITE NEGATIVE 09/17/2020 1616   LEUKOCYTESUR NEGATIVE 09/17/2020 1616   Sepsis Labs Invalid input(s): PROCALCITONIN,  WBC,  LACTICIDVEN Microbiology Recent Results (from the past 240 hour(s))  SARS CORONAVIRUS 2 (TAT 6-24 HRS) Nasopharyngeal Nasopharyngeal Swab     Status: None   Collection Time: 12/09/20  9:17 PM   Specimen: Nasopharyngeal Swab  Result Value Ref Range Status   SARS Coronavirus 2 NEGATIVE NEGATIVE Final    Comment: (NOTE) SARS-CoV-2 target nucleic acids are NOT DETECTED.  The SARS-CoV-2 RNA is generally detectable in upper and lower respiratory specimens during the acute phase of infection. Negative results do not preclude SARS-CoV-2 infection, do not rule out co-infections with other pathogens, and should not be used as the sole basis for treatment or other patient management decisions. Negative results must be combined with clinical observations, patient history, and epidemiological information. The expected result is Negative.  Fact Sheet for Patients: SugarRoll.be  Fact Sheet for Healthcare Providers: https://www.woods-mathews.com/  This test is not yet approved or cleared by the [place] FDA and  has been authorized for detection and/or diagnosis of SARS-CoV-2 by FDA under an Emergency Use Authorization (EUA). This EUA will remain  in effect (meaning this test can be used) for the duration of the COVID-19 declaration under Se ction 564(b)(1) of the Act, 21 U.S.C. section 360bbb-3(b)(1), unless the authorization is terminated or revoked sooner.  Performed at [facility], [place] [address] [place], [place] [address]      Time coordinating discharge: Over 30 minutes  SIGNED:   Mckinley Jewel, MD  [facility] 12/12/2020, 11:39 AM Pager   If 7PM-7AM, please contact night-coverage [email or web address]

## 2020-12-12 NOTE — TOC Transition Note (Signed)
Transition of Care [facility]) - CM/SW Discharge Note   Patient Details  Name: Melody Ochoa MRN: [id number] Date of Birth: [birth year]  Transition of Care [facility] CM/SW Contact:  Magnus Ivan, LCSW Phone Number: 12/12/2020, 1:39 PM   Clinical Narrative:   Patient has orders to discharge home today. Notified Corene Cornea with [facility]. [facility] will be providing HHPT and OT services. No other needs identified.   Final next level of care: [place] Barriers to Discharge: Barriers Resolved   Patient Goals and CMS Choice Patient states their goals for this hospitalization and ongoing recovery are:: home with home health CMS Medicare.gov Compare Post Acute Care list provided to:: Patient Choice offered to / list presented to : Patient (by [facility])  Discharge Placement                       Discharge Plan and Services                          HH Arranged: PT,OT [place]: [place] ([facility]) Date [place]: 12/12/20   Representative spoke with at [place]: [place] ([place]) Interventions     Readmission Risk Interventions Readmission Risk Prevention Plan 06/15/2019  Post Dischage Appt Complete  Medication Screening Complete  Transportation Screening Complete  Some recent data might be hidden

## 2020-12-12 NOTE — Care Management Important Message (Signed)
Important Message  Patient Details  Name: Melody Ochoa MRN: [id number] Date of Birth: March 03, 1941   Medicare Important Message Given:  N/A - LOS <3 / Initial given by admissions     Juliann Pulse A Tameisha Covell 12/12/2020, 8:20 AM

## 2020-12-13 ENCOUNTER — Other Ambulatory Visit: Payer: Self-pay | Admitting: Internal Medicine

## 2020-12-13 DIAGNOSIS — J8410 Pulmonary fibrosis, unspecified: Secondary | ICD-10-CM | POA: Diagnosis not present

## 2020-12-15 ENCOUNTER — Emergency Department
Admission: EM | Admit: 2020-12-15 | Discharge: 2020-12-15 | Disposition: A | Discharge status: Home / Self Care | Payer: Medicare Other | Attending: Emergency Medicine | Admitting: Emergency Medicine

## 2020-12-15 ENCOUNTER — Other Ambulatory Visit: Payer: Self-pay

## 2020-12-15 ENCOUNTER — Encounter: Payer: Self-pay | Admitting: Emergency Medicine

## 2020-12-15 DIAGNOSIS — Z5321 Procedure and treatment not carried out due to patient leaving prior to being seen by health care provider: Secondary | ICD-10-CM | POA: Diagnosis not present

## 2020-12-15 DIAGNOSIS — R0902 Hypoxemia: Secondary | ICD-10-CM | POA: Diagnosis not present

## 2020-12-15 DIAGNOSIS — Z0000 Encounter for general adult medical examination without abnormal findings: Secondary | ICD-10-CM | POA: Diagnosis not present

## 2020-12-15 DIAGNOSIS — W19XXXA Unspecified fall, initial encounter: Secondary | ICD-10-CM | POA: Diagnosis not present

## 2020-12-15 DIAGNOSIS — W1830XA Fall on same level, unspecified, initial encounter: Secondary | ICD-10-CM | POA: Diagnosis not present

## 2020-12-15 DIAGNOSIS — I10 Essential (primary) hypertension: Secondary | ICD-10-CM | POA: Diagnosis not present

## 2020-12-15 DIAGNOSIS — R52 Pain, unspecified: Secondary | ICD-10-CM | POA: Diagnosis not present

## 2020-12-15 DIAGNOSIS — R0781 Pleurodynia: Secondary | ICD-10-CM | POA: Diagnosis not present

## 2020-12-15 LAB — CBC
HCT: 36.2 % (ref 36.0–46.0)
Hemoglobin: 12 g/dL (ref 12.0–15.0)
MCH: 29.4 pg (ref 26.0–34.0)
MCHC: 33.1 g/dL (ref 30.0–36.0)
MCV: 88.7 fL (ref 80.0–100.0)
Platelets: 248 10*3/uL (ref 150–400)
RBC: 4.08 MIL/uL (ref 3.87–5.11)
RDW: 14.2 % (ref 11.5–15.5)
WBC: 8.4 10*3/uL (ref 4.0–10.5)
nRBC: 0 % (ref 0.0–0.2)

## 2020-12-15 LAB — URINALYSIS, COMPLETE (UACMP) WITH MICROSCOPIC
Bilirubin Urine: NEGATIVE
Glucose, UA: NEGATIVE mg/dL
Hgb urine dipstick: NEGATIVE
Ketones, ur: NEGATIVE mg/dL
Leukocytes,Ua: NEGATIVE
Nitrite: NEGATIVE
Protein, ur: NEGATIVE mg/dL
Specific Gravity, Urine: 1.025 (ref 1.005–1.030)
pH: 5 (ref 5.0–8.0)

## 2020-12-15 LAB — BASIC METABOLIC PANEL
Anion gap: 12 (ref 5–15)
BUN: 20 mg/dL (ref 8–23)
CO2: 25 mmol/L (ref 22–32)
Calcium: 8.9 mg/dL (ref 8.9–10.3)
Chloride: 103 mmol/L (ref 98–111)
Creatinine, Ser: 0.86 mg/dL (ref 0.44–1.00)
GFR, Estimated: >60 mL/min (ref >60)
Glucose, Bld: 115 mg/dL — ABNORMAL HIGH (ref 70–99)
Potassium: 4.2 mmol/L (ref 3.5–5.1)
Sodium: 140 mmol/L (ref 135–145)

## 2020-12-15 LAB — URINALYSIS, MICROSCOPIC (REFLEX): Bacteria, UA: NONE SEEN

## 2020-12-15 NOTE — Telephone Encounter (Signed)
This is [place] pt °

## 2020-12-15 NOTE — ED Triage Notes (Signed)
Pt to ER states she fell 2 days ago and hit left hip.  Pt states she also fell 5 days before that.  Pt states unsure if she passed out, but has memory of fall.  Pt unsure why she is falling.

## 2020-12-15 NOTE — ED Triage Notes (Signed)
Pt arrives via EMS from home, pt was recently discharged for a stroke on 1/4, pt states she had a fall denies hitting her head or LOC, EMS was called by home health PT, pt wears o2 @ night and PRN,  states she does not remember, home, pt states she has been on the floor on a mattress since 1/7, complains of pain to left rib and right shoulder EMS BP 170/110

## 2020-12-15 NOTE — ED Triage Notes (Signed)
Blood drawn and sent to lab.

## 2020-12-15 NOTE — ED Notes (Signed)
Pt did not answer to get vs. First Nurse aware. This tech attempt to call xray and no one answer at this moment.

## 2020-12-16 ENCOUNTER — Other Ambulatory Visit: Payer: Self-pay | Admitting: *Deleted

## 2020-12-16 ENCOUNTER — Telehealth: Payer: Self-pay

## 2020-12-16 NOTE — Telephone Encounter (Signed)
Transition Care Management Unsuccessful Follow-up Telephone Call  Date of discharge and from where:  12/12/20 from [facility]  Attempts:  1st Attempt  Reason for unsuccessful TCM follow-up call:  Unable to leave message. No answer. Will follow as appropriate. Hospital follow up scheduled with pcp 12/24/20 @ 1130. Keep all scheduled appointments.

## 2020-12-16 NOTE — Telephone Encounter (Signed)
Raina Mina, RN called patient due to her responses to [facility] call. Pt went to ED twice for a fall that occurred last week on 1/4. She did not stay due to wait time & complained to [facility] of rib pain. I habve not yet reached out to patient & we have nothing in office this week. Lattie Haw had suggested maybe at least an xray for patient or [place] call her to encourage her to go to an UC. She was unsure of patient's mental status or her full hx. She said that we could place a [facility] referral for someone to come in her home, but patient would have to be compliant with this. What would you advise? happy to call patient. Just didn't know if you felt an xray at hospital would be best or send her to UC. Afraid that with patent's hx she won't stay if there is any kind of wait.

## 2020-12-16 NOTE — Telephone Encounter (Signed)
Call pt  Get more info  I can see ED notes from 12/09/20   She was admitted and for acute stroke ; she presented with weakness, dizziness resulting in fall per note.   She needs f/u up with next week  She was recommended SNF at time of discharge.   Ask pt how she is doing  If she has new fall and complains of dizziness, facial numbness, confusion, vision changes, unfortunately I would have to advise ED again for stroke evaluation.   If she is having residual pain from fall, I would advise evaluation at [facility] today walk in clinic if concern for fracture.   She has an appt with cardiology 12/22/20 and neurology 01/08/21

## 2020-12-16 NOTE — Telephone Encounter (Signed)
FYI I called talked to patient she stated that she did not have any dizziness, confusion, vision changes, facial numbness at this time. She said that her main concern was the rib pain that she was having. She said that it actually hurts to clear her throat her pain is so severe. She did not have Dr. Elwyn Lade number even though she follows with pain management. She has lost her cell phone where all her numbers are stored. Pt seems to be taking her stroke very lightly & was laughing at herself. She is willing to go to [facility] walk-in to see if they can evaluated her & do xrays needed. I spoke with her husband, Jeneen Rinks as well who will get her there. Pt knows where [facility] is located. She is scheduled with is next Wednesday & husband is also aware of this appointment. I also advised him signs of stroke & that he needed to take wife if she presented with any facial dropping, slurred speech, complained of dizziness, weakness, if she was confused or had any changes in her vision. He verbalized understanding & felt that patient was okay as of now just dealing with pain from fall.

## 2020-12-16 NOTE — Patient Outreach (Signed)
[facility]) Care Management  12/16/2020  LIESL SIMONS 1941/09/25 [id number]   EMMI-GENERAL DISCHARGE-SUCCESSFUL RED ON EMMI ALERT Day #1 Date:12/14/2020 Red Alert Reason: DON'T KNOW WHO TO CALL, NO FOLLOW UP APPOINTMENT, QUESTIONS/CONCERNS  OUTREACH #1 RN spoke with pt concerning the above emmi. Encouraged pt to obtain her discharge sheet and review what to do concerning obtaining all of the requested appointments. Pt confirms awareness to contact her primary, CAD and neurologist to make an appointments by the requested days.  Pt informed RN case manager that she fell when she arrived home with some pain to her side. States she may have broken a rib. Pt was inquiring on the "process" when something like this occurs. RN encouraged the pt to seek medical attention if she felt this was an emergency. RN also informed the pt if not emergent to inform her provider when she calls to make the follow up appointment for possible intervention.  Due to the pain and discomfort pt states she returned back to the hospital on yesterday however due to the long wait she left the hospital. Notes indicated hospital attempted to call the pt for possible x-ray but were unsuccessful in reaching the pt. Strongly encouraged the pt to seek medical attention to prevent acute issues from occurring. Pt indicated she would call. RN offered [facility] services for case management needs however pt denied any needs at this time.  Case will be closed with no further needs.  Due to the information provided above RN contacted pt's provider's office with an update on the above events and requested the office to follow up with the pt for possible intervention on the above incident. Informed the office if pt is receptive to case management services to refer to [facility] for follow up however pt opt to decline at this time.   Raina Mina, RN Care Management Coordinator [place] Office [phone]

## 2020-12-16 NOTE — Telephone Encounter (Signed)
noted 

## 2020-12-17 ENCOUNTER — Telehealth: Payer: Self-pay

## 2020-12-17 NOTE — Telephone Encounter (Signed)
I called patient & stated that she had called Dr. Elwyn Lade office at pain management to see if she could be seen. She said that his office has always been very good at calling her back. She said that she did not stay at [facility] because she was freezing. When EMS came to pick her up she asked to be able to get her coat & they told patient that it was warm in the car, so she wouldn't need one. She stated that she was freoze for 3 hours at [facility] & couldn't stand it anymore, so she left. She said that she just can't stand on her own due to injuring hip when she fell. She stated that her as well as husband were isolated & really had no one they could call on. She said that she WOULD NOT go back to [facility] for walk in or ED & wait like she had. She will wait to hear from Dr. Elwyn Lade office & if not she will try to call back to move her appointment up here. I advised that she needed to be seen sooner & patient insisted that she would just wait to hear from pain management.

## 2020-12-17 NOTE — Telephone Encounter (Signed)
Pt called back & stated that she would be seeing [facility] tomorrow at 10:15. She thinks that since she has a wheelchair & husband helped get her back into the car yesterday from [facility] that she could get him to get her back in. She has a wheelchair at home to help. I asked patient to try to call us back to give [place] an update. Pt verbalized understanding of this.

## 2020-12-17 NOTE — Telephone Encounter (Signed)
Transition Care Management Unsuccessful Follow-up Telephone Call  Date of discharge and from where:  12/12/20 from [facility]  Attempts:  2nd Attempt  Reason for unsuccessful TCM follow-up call:  Unable to leave message. Will follow.

## 2020-12-17 NOTE — Telephone Encounter (Signed)
Call pt I am very concerned for patient and inability to move.  She needs to been seen in urgent care that can obtain Xray such as [facility] or emergency room. I know a terrible time to be in ED however Im worried about her safety since recent fall and virtual visit in not safe nor appropriate. She will need xrays and in person exam.   Please advise for her safety and pain control that she returns to [employer].

## 2020-12-17 NOTE — Telephone Encounter (Signed)
Patient's neighbor called Melody Haver due to her concern for patient. She was at patient's home to check on her bc she had seen EMS there multiple times in the last week. She said that patient was unable to even sit up. She had been laying flat for days due to her shoulder & rib pan She was actually eating lunch laying flat. She said that patient did not stay at [facility] yesterday bc she was tired of sitting & waiting. Tye [place] stated that EMS actually came & got patient into husband's car in her nightgown. She was concerned that patient needed xray & she has lost cell phone with numbers. She indicated that patient's husband was not much help & that there was not much family around to help patient. I advised that we had no appointments at our office the next day for her to be seen. She has left ED now twice due to wait time.   I did reach out to Bridgett at [facility] to see if any available appointments & they had none. Not sure if patient could get there anyway. I don't know how patient will get to see anyone for an appointment, UC or ED if advised unless husband calls EMS.   Per advice I will call patient back & advise UC or ED. She needs to be evaluated in person [place] over the phone is unsafe. She needs to wait to be seen.

## 2020-12-18 ENCOUNTER — Telehealth: Payer: Self-pay | Admitting: Student in an Organized Health Care Education/Training Program

## 2020-12-18 DIAGNOSIS — S2232XA Fracture of one rib, left side, initial encounter for closed fracture: Secondary | ICD-10-CM | POA: Diagnosis not present

## 2020-12-18 DIAGNOSIS — M7541 Impingement syndrome of right shoulder: Secondary | ICD-10-CM | POA: Diagnosis not present

## 2020-12-18 NOTE — Telephone Encounter (Signed)
Patient has fallen 2x in five days. She is hurting on her hip and shoulder. Is there anything Dr. Holley Raring can do?

## 2020-12-18 NOTE — Telephone Encounter (Signed)
Called patient, gentleman who answered the phone states they are in the car on the way here. I told him we would discuss when she gets here, I was unaware she had an appt today.

## 2020-12-18 NOTE — Telephone Encounter (Signed)
2 attempts to reach patient, call will not go in at this time.

## 2020-12-18 NOTE — Telephone Encounter (Signed)
Transition Care Management Follow-up Telephone Call  Date of discharge and from where: 12/12/20 from [facility]  How have you been since you were released from the hospital? Patient states,"I am hurting. Radiologist said I may have 2-3 broken ribs. I plan to take an OTC pain reliever to help although I know it will take time." Denies n/v/d, headache, slurred speech, fever, numbness and all other symptoms. Dizzy upon standing, takes a moment and it resolves before walking. Resting very well. Intake/output appropriate.  Any questions or concerns? Patient is considering getting a BSC and HH nurse/aide services. hink about it and discuss at hfu.   Items Reviewed:  Did the pt receive and understand the discharge instructions provided? Yes , monitor blood pressure. Rest and Ice. Increase activity slowly.       Medications obtained and verified? Yes , Continue aspirin for 3 weeks and then Plavix alone. Start amlodipine 5 mg daily. Taking all other medication as scheduled.   Other? Yes , O2 set at 2L  Any new allergies since your discharge? No   Dietary orders reviewed? Low sodium diet  Do you have support at home? Yes   Home Care and Equipment/Supplies: Were home health PT/OT services ordered? Yes Has the agency set up a time to come to the patient's home? Patient has been contacted and awaiting consultation visit.  Functional Questionnaire: (I = Independent and D = Dependent) ADLs: Husband assist as needed  Transferring/Ambulation- walker  Managing Meds- I   Follow up appointments reviewed:   PCP Hospital f/u appt confirmed? Yes  Scheduled to see [name] on 12/24/20 @ 11:30. CBC and BMP.  [facility] f/u? Patient notes she is calling Cardiology and Neurology directly after closing the call with nurse. Phone numbers confirmed.   Are transportation arrangements needed? No   If their condition worsens, is the pt aware to call PCP or go to the Emergency Dept.? Yes  Was the  patient provided with contact information for the PCP's office or ED? Yes  Was to pt encouraged to call back with questions or concerns? Yes

## 2020-12-18 NOTE — Telephone Encounter (Signed)
She doesn't have an appt.

## 2020-12-19 NOTE — Telephone Encounter (Signed)
Pt called back returning your call °

## 2020-12-19 NOTE — Telephone Encounter (Signed)
I returned patient's call & she did say that she had some broken ribs. She said that she did get up & walk a little with her walker. She said that had called to ask me something, but had forgotten. I told her after 11:30 I didn't have patients & if she remembered to call me back.

## 2020-12-20 ENCOUNTER — Emergency Department
Admission: EM | Admit: 2020-12-20 | Discharge: 2020-12-22 | Disposition: A | Discharge status: Home / Self Care | Payer: Medicare Other | Attending: Emergency Medicine | Admitting: Emergency Medicine

## 2020-12-20 ENCOUNTER — Encounter: Payer: Self-pay | Admitting: *Deleted

## 2020-12-20 DIAGNOSIS — W19XXXA Unspecified fall, initial encounter: Secondary | ICD-10-CM | POA: Diagnosis not present

## 2020-12-20 DIAGNOSIS — Z79899 Other long term (current) drug therapy: Secondary | ICD-10-CM | POA: Insufficient documentation

## 2020-12-20 DIAGNOSIS — R0602 Shortness of breath: Secondary | ICD-10-CM | POA: Insufficient documentation

## 2020-12-20 DIAGNOSIS — Z85828 Personal history of other malignant neoplasm of skin: Secondary | ICD-10-CM | POA: Insufficient documentation

## 2020-12-20 DIAGNOSIS — Z7902 Long term (current) use of antithrombotics/antiplatelets: Secondary | ICD-10-CM | POA: Diagnosis not present

## 2020-12-20 DIAGNOSIS — I517 Cardiomegaly: Secondary | ICD-10-CM | POA: Diagnosis not present

## 2020-12-20 DIAGNOSIS — R0781 Pleurodynia: Secondary | ICD-10-CM | POA: Insufficient documentation

## 2020-12-20 DIAGNOSIS — Z8673 Personal history of transient ischemic attack (TIA), and cerebral infarction without residual deficits: Secondary | ICD-10-CM | POA: Diagnosis not present

## 2020-12-20 DIAGNOSIS — Z7982 Long term (current) use of aspirin: Secondary | ICD-10-CM | POA: Diagnosis not present

## 2020-12-20 DIAGNOSIS — Y92009 Unspecified place in unspecified non-institutional (private) residence as the place of occurrence of the external cause: Secondary | ICD-10-CM | POA: Insufficient documentation

## 2020-12-20 DIAGNOSIS — Z20822 Contact with and (suspected) exposure to covid-19: Secondary | ICD-10-CM | POA: Diagnosis not present

## 2020-12-20 DIAGNOSIS — M25512 Pain in left shoulder: Secondary | ICD-10-CM | POA: Insufficient documentation

## 2020-12-20 DIAGNOSIS — R29898 Other symptoms and signs involving the musculoskeletal system: Secondary | ICD-10-CM | POA: Diagnosis not present

## 2020-12-20 DIAGNOSIS — R531 Weakness: Secondary | ICD-10-CM | POA: Diagnosis not present

## 2020-12-20 DIAGNOSIS — Z743 Need for continuous supervision: Secondary | ICD-10-CM | POA: Diagnosis not present

## 2020-12-20 DIAGNOSIS — R0600 Dyspnea, unspecified: Secondary | ICD-10-CM | POA: Diagnosis not present

## 2020-12-20 DIAGNOSIS — Z87891 Personal history of nicotine dependence: Secondary | ICD-10-CM | POA: Insufficient documentation

## 2020-12-20 DIAGNOSIS — I129 Hypertensive chronic kidney disease with stage 1 through stage 4 chronic kidney disease, or unspecified chronic kidney disease: Secondary | ICD-10-CM | POA: Insufficient documentation

## 2020-12-20 DIAGNOSIS — N1831 Chronic kidney disease, stage 3a: Secondary | ICD-10-CM | POA: Diagnosis not present

## 2020-12-20 DIAGNOSIS — Z96641 Presence of right artificial hip joint: Secondary | ICD-10-CM | POA: Diagnosis not present

## 2020-12-20 DIAGNOSIS — M25511 Pain in right shoulder: Secondary | ICD-10-CM | POA: Diagnosis not present

## 2020-12-20 DIAGNOSIS — J8410 Pulmonary fibrosis, unspecified: Secondary | ICD-10-CM

## 2020-12-20 DIAGNOSIS — Z91040 Latex allergy status: Secondary | ICD-10-CM | POA: Diagnosis not present

## 2020-12-20 DIAGNOSIS — R52 Pain, unspecified: Secondary | ICD-10-CM | POA: Diagnosis not present

## 2020-12-20 LAB — COMPREHENSIVE METABOLIC PANEL
ALT: 13 U/L (ref 0–44)
AST: 19 U/L (ref 15–41)
Albumin: 3.5 g/dL (ref 3.5–5.0)
Alkaline Phosphatase: 58 U/L (ref 38–126)
Anion gap: 9 (ref 5–15)
BUN: 21 mg/dL (ref 8–23)
CO2: 28 mmol/L (ref 22–32)
Calcium: 9 mg/dL (ref 8.9–10.3)
Chloride: 104 mmol/L (ref 98–111)
Creatinine, Ser: 1.05 mg/dL — ABNORMAL HIGH (ref 0.44–1.00)
GFR, Estimated: 54 mL/min — ABNORMAL LOW (ref >60)
Glucose, Bld: 105 mg/dL — ABNORMAL HIGH (ref 70–99)
Potassium: 3.1 mmol/L — ABNORMAL LOW (ref 3.5–5.1)
Sodium: 141 mmol/L (ref 135–145)
Total Bilirubin: 0.5 mg/dL (ref 0.3–1.2)
Total Protein: 7.2 g/dL (ref 6.5–8.1)

## 2020-12-20 LAB — CBC WITH DIFFERENTIAL/PLATELET
Abs Immature Granulocytes: 0.03 10*3/uL (ref 0.00–0.07)
Basophils Absolute: 0 10*3/uL (ref 0.0–0.1)
Basophils Relative: 1 %
Eosinophils Absolute: 0.4 10*3/uL (ref 0.0–0.5)
Eosinophils Relative: 6 %
HCT: 33 % — ABNORMAL LOW (ref 36.0–46.0)
Hemoglobin: 10.4 g/dL — ABNORMAL LOW (ref 12.0–15.0)
Immature Granulocytes: 0 %
Lymphocytes Relative: 21 %
Lymphs Abs: 1.5 10*3/uL (ref 0.7–4.0)
MCH: 28 pg (ref 26.0–34.0)
MCHC: 31.5 g/dL (ref 30.0–36.0)
MCV: 88.9 fL (ref 80.0–100.0)
Monocytes Absolute: 0.6 10*3/uL (ref 0.1–1.0)
Monocytes Relative: 9 %
Neutro Abs: 4.6 10*3/uL (ref 1.7–7.7)
Neutrophils Relative %: 63 %
Platelets: 248 10*3/uL (ref 150–400)
RBC: 3.71 MIL/uL — ABNORMAL LOW (ref 3.87–5.11)
RDW: 14.3 % (ref 11.5–15.5)
WBC: 7.2 10*3/uL (ref 4.0–10.5)
nRBC: 0 % (ref 0.0–0.2)

## 2020-12-20 LAB — CK: Total CK: 69 U/L (ref 38–234)

## 2020-12-20 LAB — RESP PANEL BY RT-PCR (FLU A&B, COVID) ARPGX2
Influenza A by PCR: NEGATIVE
Influenza B by PCR: NEGATIVE
SARS Coronavirus 2 by RT PCR: NEGATIVE

## 2020-12-20 MED ORDER — FAMOTIDINE 20 MG PO TABS
20.0000 mg | ORAL_TABLET | Freq: Every day | ORAL | Status: DC
  Administered 2020-12-20 – 2020-12-21 (×2): 20 mg via ORAL
  Filled 2020-12-20 (×2): qty 1

## 2020-12-20 MED ORDER — AMLODIPINE BESYLATE 5 MG PO TABS
5.0000 mg | ORAL_TABLET | Freq: Every day | ORAL | Status: DC
  Administered 2020-12-20 – 2020-12-21 (×2): 5 mg via ORAL
  Filled 2020-12-20 (×3): qty 1

## 2020-12-20 MED ORDER — PREGABALIN 50 MG PO CAPS
50.0000 mg | ORAL_CAPSULE | Freq: Every day | ORAL | Status: DC
  Administered 2020-12-20 – 2020-12-21 (×2): 50 mg via ORAL
  Filled 2020-12-20: qty 1
  Filled 2020-12-20: qty 2
  Filled 2020-12-20: qty 1

## 2020-12-20 MED ORDER — BUPROPION HCL ER (XL) 150 MG PO TB24
300.0000 mg | ORAL_TABLET | Freq: Every day | ORAL | Status: DC
  Administered 2020-12-20 – 2020-12-21 (×2): 300 mg via ORAL
  Filled 2020-12-20 (×2): qty 2

## 2020-12-20 MED ORDER — FUROSEMIDE 40 MG PO TABS
20.0000 mg | ORAL_TABLET | Freq: Every day | ORAL | Status: DC
  Administered 2020-12-20 – 2020-12-21 (×2): 20 mg via ORAL
  Filled 2020-12-20 (×3): qty 1

## 2020-12-20 MED ORDER — DULOXETINE HCL 30 MG PO CPEP
30.0000 mg | ORAL_CAPSULE | Freq: Every day | ORAL | Status: DC
  Administered 2020-12-20: 30 mg via ORAL
  Filled 2020-12-20 (×3): qty 1

## 2020-12-20 MED ORDER — ATORVASTATIN CALCIUM 20 MG PO TABS
80.0000 mg | ORAL_TABLET | Freq: Every day | ORAL | Status: DC
  Administered 2020-12-20 – 2020-12-21 (×2): 80 mg via ORAL
  Filled 2020-12-20: qty 4
  Filled 2020-12-20: qty 1
  Filled 2020-12-20: qty 4

## 2020-12-20 MED ORDER — CLOPIDOGREL BISULFATE 75 MG PO TABS
75.0000 mg | ORAL_TABLET | Freq: Every day | ORAL | Status: DC
  Administered 2020-12-20 – 2020-12-21 (×2): 75 mg via ORAL
  Filled 2020-12-20 (×3): qty 1

## 2020-12-20 MED ORDER — ASPIRIN EC 81 MG PO TBEC
81.0000 mg | DELAYED_RELEASE_TABLET | Freq: Every day | ORAL | Status: DC
  Administered 2020-12-20 – 2020-12-21 (×2): 81 mg via ORAL
  Filled 2020-12-20 (×3): qty 1

## 2020-12-20 NOTE — ED Triage Notes (Signed)
Pt in via EMS from home with c/o fall. EMS reports pt fell Thursday, was evaluated and dx'd with fx'd ribs on the left side and something wrong with her right shoulder. EMS reports pt has not been able to control pain and has not been able to move around like normal

## 2020-12-20 NOTE — ED Notes (Signed)
Patient is back on stretcher. Patient repositioned self independently.

## 2020-12-20 NOTE — ED Notes (Signed)
Patient walked with PT in the hallway. Per Pt's report, patient desat to 81% while walking, but came back up to 91% on room air when sitting on side of the stretcher. Patient states she is on 2L O2 via [place] at night and as needed during the day. Patient was placed on 2L O2 via [place] by this Probation officer. Aquilla Solian, PA-C aware.

## 2020-12-20 NOTE — Evaluation (Signed)
Physical Therapy Evaluation Patient Details Name: Melody Ochoa MRN: [id number] DOB: 1941-07-31 Today's Date: 12/20/2020   History of Present Illness  Pt is an 80 y.o. female diagnosed with a small acute right cerebellar infarct on 12/09/2020, presents to the emergency department with concern for persistent right shoulder pain and left-sided rib pain after a fall.  Patient was seen and evaluated by [facility] with x-rays taken around 12/18/2020 which showed nondisplaced left eighth, ninth and 10th rib fractures and no acute fractures of the right shoulder.  PMH also includes back pain, skin CA,, dizziness, HLD, CVA, CKD 3, pulmonary fibrosis, HTN, peripheral neuropathy, lumbar Sx.    Clinical Impression  Pt was pleasant and motivated to participate during the session. Pt did not require physical assistance during the session but did need extra time and effort with bed mobility tasks secondary to R shoulder pain.  Pt was steady with transfers but did demonstrate some sway in static standing without UE support.  Pt ambulated with a slow cadence but was steady without LOB. Pt's SpO2 dropped to 81% on room air after amb and increased back to low 90s at rest in <30 sec, nsg notified and nurse placed pt on 2LO2/min.  Pt reported using 2LO2/min at night and then PRN during the day.  Pt reported owning a pulsox but stated that she did not know about guidelines regarding O2 saturation levels and has seen her O2 as low as the lower 70s but has not mentioned this to any health care provider, nsg notified. Pt will benefit from HHPT services upon discharge to safely address deficits listed in patient problem list for decreased caregiver assistance and eventual return to PLOF.      Follow Up Recommendations Home health PT;Supervision for mobility/OOB    Equipment Recommendations  None recommended by PT    Recommendations for Other Services       Precautions / Restrictions Precautions Precautions:  Fall Restrictions Weight Bearing Restrictions: No Other Position/Activity Restrictions: Watch O2      Mobility  Bed Mobility Overal bed mobility: Modified Independent             General bed mobility comments: Extra time and effort only with bed mobility tasks    Transfers Overall transfer level: Needs assistance Equipment used: Rolling walker (2 wheeled) Transfers: Sit to/from Stand Sit to Stand: Supervision         General transfer comment: Good eccentric and concentric control and stability with transfers  Ambulation/Gait Ambulation/Gait assistance: Supervision Gait Distance (Feet): 80 Feet Assistive device: Rolling walker (2 wheeled) Gait Pattern/deviations: Step-through pattern;Decreased step length - right;Decreased step length - left Gait velocity: decreased   General Gait Details: Slow cadence with amb but steady without LOB; Pt's SpO2 dropped to 81% on room air after amb, increased back to low 90s at rest in <30 sec, nsg notified and placed pt on 2LO2/min  Stairs Stairs:  (Pt and spouse given verbal education on proper sequencing for ascending and descending steps assuming a LLE that is weaker than the RLE per pt subjective report)          Wheelchair Mobility    Modified Rankin (Stroke Patients Only)       Balance Overall balance assessment: Needs assistance Sitting-balance support: Feet unsupported;No upper extremity supported Sitting balance-Leahy Scale: Good     Standing balance support: Bilateral upper extremity supported;During functional activity Standing balance-Leahy Scale: Good  Pertinent Vitals/Pain Pain Assessment: 0-10 Pain Score: 5  Pain Location: R shoulder Pain Descriptors / Indicators: Aching;Discomfort;Sore Pain Intervention(s): Monitored during session;Patient requesting pain meds-RN notified;RN gave pain meds during session    Home Living Family/patient expects to be  discharged to:: Private residence Living Arrangements: Spouse/significant other Available Help at Discharge: Family;Available 24 hours/day Type of Home: House Home Access: Stairs to enter Entrance Stairs-Rails: Right;Left;Can reach both Entrance Stairs-Number of Steps: 2 Home Layout: One level Home Equipment: Walker - 2 wheels;Cane - single point;Bedside commode;Shower seat - built in      Prior Function Level of Independence: Independent with assistive device(s)         Comments: Ind amb in the home without an AD, SPC PRN limited community distances, 6 falls in the last 6 months secondary to LOB and or LLE weakness     Hand Dominance        Extremity/Trunk Assessment   Upper Extremity Assessment Upper Extremity Assessment: Generalized weakness;RUE deficits/detail RUE Deficits / Details: R elbow flx/ext functional; R shoulder AROM limited by pain RUE: Unable to fully assess due to pain    Lower Extremity Assessment Lower Extremity Assessment: Generalized weakness       Communication   Communication: No difficulties  Cognition Arousal/Alertness: Awake/alert Behavior During Therapy: WFL for tasks assessed/performed Overall Cognitive Status: Within Functional Limits for tasks assessed                                        General Comments General comments (skin integrity, edema, etc.): Min sway with static standing without UE support    Exercises Other Exercises Other Exercises: Pt and spouse verbal education on proper sequencing up/down stairs Other Exercises: Pt and spouse education on safe management of bed mobility tasks with home set up   Assessment/Plan    PT Assessment Patient needs continued PT services  PT Problem List Decreased strength;Decreased activity tolerance;Decreased balance;Decreased mobility;Decreased knowledge of use of DME       PT Treatment Interventions DME instruction;Functional mobility training;Gait  training;Therapeutic activities;Therapeutic exercise;Balance training;Patient/family education    PT Goals (Current goals can be found in the Care Plan section)  Acute Rehab PT Goals Patient Stated Goal: To be able to walk with my husband and dog PT Goal Formulation: With patient Time For Goal Achievement: 01/02/21 Potential to Achieve Goals: Good    Frequency Min 2X/week   Barriers to discharge        Co-evaluation               AM-PAC PT "6 Clicks" Mobility  Outcome Measure Help needed turning from your back to your side while in a flat bed without using bedrails?: None Help needed moving from lying on your back to sitting on the side of a flat bed without using bedrails?: A Little Help needed moving to and from a bed to a chair (including a wheelchair)?: A Little Help needed standing up from a chair using your arms (e.g., wheelchair or bedside chair)?: A Little Help needed to walk in hospital room?: A Little Help needed climbing 3-5 steps with a railing? : A Little 6 Click Score: 19    End of Session Equipment Utilized During Treatment: Gait belt Activity Tolerance: Patient tolerated treatment well Patient left: in bed;with nursing/sitter in room;with family/visitor present;with call bell/phone within reach Nurse Communication: Mobility status;Other (comment) (SpO2 drop to 81% after amb)  PT Visit Diagnosis: Muscle weakness (generalized) (M62.81);Repeated falls (R29.6);Difficulty in walking, not elsewhere classified (R26.2);History of falling (Z91.81)    Time: [id number] PT Time Calculation (min) (ACUTE ONLY): 45 min   Charges:   PT Evaluation $PT Eval Moderate Complexity: 1 Mod PT Treatments $Therapeutic Activity: 8-22 mins        D. Royetta Asal PT, DPT 12/20/20, 6:38 PM

## 2020-12-20 NOTE — ED Notes (Signed)
Patient sitting on side of the bed eating her dinner. Patient has 2 L O2 via [place] in place. No dyspnea noted.

## 2020-12-20 NOTE — ED Provider Notes (Signed)
[facility]-EMERGENCY DEPARTMENT  ____________________________________________  Time seen: Approximately 4:03 PM  I have reviewed the triage vital signs and the nursing notes.   HISTORY  Chief Complaint Fall   Historian Patient     HPI Melody Ochoa is a 80 y.o. female diagnosed with a small acute right cerebellar infarct on 12/09/2020, presents to the emergency department with concern for persistent right shoulder pain and left-sided rib pain after a fall.  Patient was seen and evaluated by [facility] with x-rays taken around 12/18/2020 which showed nondisplaced left eighth, ninth and 10th rib fractures and no acute fractures of the right shoulder.  Patient states that she has been reclined mostly on a mattress on the floor since experiencing fall.  Patient states that she has dizziness when she stands up and that her husband is struggling to take care of her at home.  She states that her husband is 76 years old and states that he can no longer take care of her physically.  Patient states that she has been taking Lyrica and applying lidocaine patches for pain.  She denies chest pain, chest tightness or shortness of breath.  No nausea, vomiting or abdominal pain.  Patient has been living independently with her husband.   Past Medical History:  Diagnosis Date  . Arthritis   . Atheromatous plaque 10/18/2017  . Back pain   . Cancer ([place])    skin  . Depression   . Diverticulosis   . Dizziness    patient had episode of dizziness when came in room. hx past no dx  . GERD (gastroesophageal reflux disease)   . Heart murmur   . Hyperlipidemia 10/18/2017  . PONV (postoperative nausea and vomiting)    yrs ago  . Squamous cell carcinoma of skin 11/16/2016   L chest parasternal  . Squamous cell carcinoma of skin 02/05/2020   L cheek inf to lat zygoma  . Stroke [facility])      Immunizations up to date:  Yes.     Past Medical History:  Diagnosis Date  . Arthritis   . Atheromatous plaque  10/18/2017  . Back pain   . Cancer ([place])    skin  . Depression   . Diverticulosis   . Dizziness    patient had episode of dizziness when came in room. hx past no dx  . GERD (gastroesophageal reflux disease)   . Heart murmur   . Hyperlipidemia 10/18/2017  . PONV (postoperative nausea and vomiting)    yrs ago  . Squamous cell carcinoma of skin 11/16/2016   L chest parasternal  . Squamous cell carcinoma of skin 02/05/2020   L cheek inf to lat zygoma  . Stroke [facility])     Patient Active Problem List   Diagnosis Date Noted  . Ischemic stroke ([place]) 12/10/2020  . Chronic kidney disease, stage 3a ([place]) 12/09/2020  . Chronic respiratory failure with hypoxia ([place]) 12/09/2020  . Fall at home, initial encounter 12/09/2020  . Pain in right arm 12/09/2020  . Memory deficit 09/24/2020  . Pulmonary fibrosis ([place]) 09/14/2020  . Chronic cough 09/14/2020  . Arthritis of left sacroiliac joint 08/26/2020  . Chronic left SI joint pain 08/26/2020  . CKD (chronic kidney disease) 07/23/2020  . HTN (hypertension) 04/23/2020  . Positive fecal immunochemical test   . Angiodysplasia of intestinal tract   . Transient ischemic attack (TIA) 10/08/2019  . Transient right leg weakness 09/30/2019  . Peripheral neuropathy 08/10/2019  . Primary osteoarthritis of left shoulder 07/24/2019  .  Elevated blood pressure reading 06/27/2019  . Left shoulder pain 05/04/2019  . Anemia 03/19/2019  . Long term current use of opiate analgesic 01/09/2019  . Foraminal stenosis of lumbar region 11/14/2018  . Chronic pain syndrome 09/12/2018  . SOB (shortness of breath) on exertion 08/11/2018  . Dysphagia 08/11/2018  . Cough 06/30/2018  . Rash 01/16/2018  . Hyperlipidemia 10/18/2017  . History of lumbar surgery 10/04/2017  . Depression, recurrent ([place]) 09/27/2017  . Pulmonary nodules 09/27/2017  . History of CVA (cerebrovascular accident) 09/12/2017  . Cerebellar stroke, acute ([place]) 08/10/2017  . Primary  osteoarthritis of left hip 02/28/2017  . Strain of left hip 02/28/2017  . B12 deficiency 12/21/2016  . Generalized OA 05/13/2016  . Status post total replacement of right hip 04/22/2016  . Personal history of colonic polyps   . Greater trochanteric bursitis of both hips 01/09/2016  . Prediabetes 08/04/2015  . Thoracic aortic atherosclerosis ([place]) 06/16/2015  . 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  . UTI (lower urinary tract infection) 11/22/2014  . Low back pain 11/22/2014  . Spondylolisthesis of lumbar region 11/18/2014  . Lumbar radiculopathy 08/21/2014  . Degeneration of lumbar or lumbosacral intervertebral disc 08/21/2014  . Left hip pain 08/21/2014  . Osteopenia 06/23/2014  . Major depression in remission ([place]) 06/23/2014  . Anxiety 06/23/2014  . Menopause 06/23/2014    Past Surgical History:  Procedure Laterality Date  . BACK SURGERY  13  . BREAST BIOPSY Bilateral    cores "years ago"  . BREAST EXCISIONAL BIOPSY Left 90's  . CATARACT EXTRACTION W/PHACO Left 12/20/2018   Procedure: CATARACT EXTRACTION PHACO AND INTRAOCULAR LENS PLACEMENT ([place]) LEFT TOPICAL;  Surgeon: Leandrew Koyanagi, MD;  Location: [place];  Service: Ophthalmology;  Laterality: Left;  . CATARACT EXTRACTION W/PHACO Right 01/24/2019   Procedure: CATARACT EXTRACTION PHACO AND INTRAOCULAR LENS PLACEMENT ([place]) RIGHT;  Surgeon: Leandrew Koyanagi, MD;  Location: [place];  Service: Ophthalmology;  Laterality: Right;  . CERVICAL DISC SURGERY  09  . COLONOSCOPY WITH PROPOFOL N/A 01/13/2016   Procedure: COLONOSCOPY WITH PROPOFOL;  Surgeon: [name], MD;  Location: [facility] ENDOSCOPY;  Service: Endoscopy;  Laterality: N/A;  . COLONOSCOPY WITH PROPOFOL N/A 02/19/2020   Procedure: COLONOSCOPY WITH PROPOFOL;  Surgeon: [name], MD;  Location: [facility] ENDOSCOPY;  Service: Gastroenterology;  Laterality: N/A;  . FACELIFT  94  . LOOP RECORDER INSERTION N/A 08/23/2017   Procedure: LOOP RECORDER  INSERTION;  Surgeon: Thompson Grayer, MD;  Location: [place] CV LAB;  Service: Cardiovascular;  Laterality: N/A;  . TEE WITHOUT CARDIOVERSION N/A 08/12/2017   Procedure: TRANSESOPHAGEAL ECHOCARDIOGRAM (TEE);  Surgeon: Sueanne Margarita, MD;  Location: [facility] ENDOSCOPY;  Service: Cardiovascular;  Laterality: N/A;  . TONSILLECTOMY    . TOTAL HIP ARTHROPLASTY Right 04/22/2016   Procedure: TOTAL HIP ARTHROPLASTY;  Surgeon: Corky Mull, MD;  Location: [facility] ORS;  Service: Orthopedics;  Laterality: Right;    Prior to Admission medications   Medication Sig Start Date End Date Taking? Authorizing Provider  albuterol (VENTOLIN HFA) 108 (90 Base) MCG/ACT inhaler TAKE 2 PUFFS BY MOUTH EVERY 6 HOURS AS NEEDED FOR WHEEZE OR SHORTNESS OF BREATH 10/14/20   Tyler Pita, MD  amLODipine (NORVASC) 5 MG tablet Take 1 tablet (5 mg total) by mouth daily. 12/12/20 12/12/21  Pahwani, Michell Heinrich, MD  aspirin EC 81 MG EC tablet Take 1 tablet (81 mg total) by mouth daily for 21 days. Swallow whole. 12/13/20 01/03/21  Pahwani, Michell Heinrich, MD  atorvastatin (LIPITOR)  80 MG tablet Take 1 tablet (80 mg total) by mouth daily. 12/16/20   Deboraha Sprang, MD  buPROPion (WELLBUTRIN XL) 300 MG 24 hr tablet Take 300 mg by mouth daily.  07/15/17   [provider]  clopidogrel (PLAVIX) 75 MG tablet TAKE 1 TABLET BY MOUTH EVERY DAY Patient taking differently: Take 75 mg by mouth daily. 06/16/20   Deboraha Sprang, MD  DULoxetine (CYMBALTA) 30 MG capsule Take 30 mg by mouth daily. 08/31/20   [provider]  famotidine (PEPCID) 20 MG tablet Take 20 mg by mouth at bedtime.  11/07/18   [provider]  fluticasone (FLONASE) 50 MCG/ACT nasal spray SPRAY 2 SPRAYS INTO EACH NOSTRIL EVERY DAY Patient taking differently: Place into both nostrils daily as needed for allergies. 09/09/20   Burnard Hawthorne, FNP  furosemide (LASIX) 20 MG tablet Take 1 tablet (20 mg) by mouth once daily x 3 days, then take 1 tablet (20 mg) once daily as  needed for swelling Patient not taking: No sig reported 06/10/20   Deboraha Sprang, MD  pregabalin (LYRICA) 50 MG capsule 50 mg during day, 100 mg at night 09/15/20   Gillis Santa, MD    Allergies Latex, Aleve [naproxen sodium], Aspirin, Celebrex [celecoxib], Effexor [venlafaxine], Gabapentin, Hydrocodone-homatropine, Ibuprofen, Mobic [meloxicam], Tape, Vioxx [rofecoxib], Other, and Tramadol  Family History  Problem Relation Age of Onset  . Lung cancer Father   . Aneurysm Brother   . Stroke Brother   . Diabetes Maternal Grandmother   . Kidney disease Maternal Grandfather   . Cushing syndrome Paternal Grandmother   . Dementia Paternal Grandfather   . Breast cancer Maternal Aunt 52    Social History Social History   Tobacco Use  . Smoking status: Former Smoker    Packs/day: 0.75    Years: 40.00    Pack years: 30.00    Types: Cigarettes    Quit date: 11/13/1996    Years since quitting: 24.1  . Smokeless tobacco: Never Used  Vaping Use  . Vaping Use: Never used  Substance Use Topics  . Alcohol use: Yes    Comment: occ wine  . Drug use: No     Review of Systems  Constitutional: No fever/chills Eyes:  No discharge ENT: No upper respiratory complaints. Respiratory: no cough. No SOB/ use of accessory muscles to breath Gastrointestinal:   No nausea, no vomiting.  No diarrhea.  No constipation. Musculoskeletal: Patient has right sided shoulder pain and left sided rib pain.  Skin: Negative for rash, abrasions, lacerations, ecchymosis.    ____________________________________________   PHYSICAL EXAM:  VITAL SIGNS: ED Triage Vitals  Enc Vitals Group     BP 12/20/20 1238 (!) 144/64     Pulse Rate 12/20/20 1238 72     Resp 12/20/20 1238 16     Temp 12/20/20 1238 98.7 F (37.1 C)     Temp Source 12/20/20 1238 Oral     SpO2 12/20/20 1238 95 %     Weight 12/20/20 1239 200 lb (90.7 kg)     Height 12/20/20 1239 5\' 4"  (1.626 m)     Head Circumference --      Peak Flow --       Pain Score 12/20/20 1238 8     Pain Loc --      Pain Edu? --      Excl. in [place]? --      Constitutional: Alert and oriented. Well appearing and in no acute distress. Eyes:  Conjunctivae are normal. PERRL. EOMI. Head: Atraumatic. ENT:      Ears: TMs are pearly.       Nose: No congestion/rhinnorhea.      Mouth/Throat: Mucous membranes are moist.  Neck: No stridor.  No cervical spine tenderness to palpation. Cardiovascular: Normal rate, regular rhythm. Normal S1 and S2.  Good peripheral circulation. Respiratory: Normal respiratory effort without tachypnea or retractions. Lungs CTAB. Good air entry to the bases with no decreased or absent breath sounds Gastrointestinal: Bowel sounds x 4 quadrants. Soft and nontender to palpation. No guarding or rigidity. No distention. Musculoskeletal: Patient has symmetric strength in the upper extremities. Patient has left sided rib pain to palpation.  Neurologic:  Normal for age. No gross focal neurologic deficits are appreciated.  Skin:  Skin is warm, dry and intact. No rash noted. Psychiatric: Mood and affect are normal for age. Speech and behavior are normal.   ____________________________________________   LABS (all labs ordered are listed, but only abnormal results are displayed)  Labs Reviewed  CBC WITH DIFFERENTIAL/PLATELET - Abnormal; Notable for the following components:      Result Value   RBC 3.71 (*)    Hemoglobin 10.4 (*)    HCT 33.0 (*)    All other components within normal limits  COMPREHENSIVE METABOLIC PANEL - Abnormal; Notable for the following components:   Potassium 3.1 (*)    Glucose, Bld 105 (*)    Creatinine, Ser 1.05 (*)    GFR, Estimated 54 (*)    All other components within normal limits  RESP PANEL BY RT-PCR (FLU A&B, COVID) ARPGX2  CK   ____________________________________________  EKG   ____________________________________________  RADIOLOGY     No results  found.  ____________________________________________    PROCEDURES  Procedure(s) performed:     Procedures     Medications  amLODipine (NORVASC) tablet 5 mg (has no administration in time range)  aspirin EC tablet 81 mg (has no administration in time range)  atorvastatin (LIPITOR) tablet 80 mg (has no administration in time range)  buPROPion (WELLBUTRIN XL) 24 hr tablet 300 mg (has no administration in time range)  clopidogrel (PLAVIX) tablet 75 mg (has no administration in time range)  DULoxetine (CYMBALTA) DR capsule 30 mg (has no administration in time range)  famotidine (PEPCID) tablet 20 mg (has no administration in time range)  furosemide (LASIX) tablet 20 mg (has no administration in time range)  pregabalin (LYRICA) capsule 50 mg (has no administration in time range)     ____________________________________________   INITIAL IMPRESSION / ASSESSMENT AND PLAN / ED COURSE  Pertinent labs & imaging results that were available during my care of the patient were reviewed by me and considered in my medical decision making (see chart for details).       Assessment and Plan:  Fall 80 year old female presents to the emergency department for skilled nursing/rehab placement and social work consult.  Patient states that she has been at home for the past 3 to 4 days and has been mostly immobilized to mattress in her bedroom.  She states that her elderly husband has been unable to care for her and she needs further healthcare resources.  CBC and CMP are reassuring.  COVID-19 and influenza testing are negative.  CK is within reference range.  Patient's home meds have been ordered and patient is receptive to boarding in the emergency department overnight for social work consult.  Patient has already been evaluated by PT in the emergency department.  Camera operator, Levada Dy  has been contacted about patient's boarding status.  ____________________________________________  FINAL  CLINICAL IMPRESSION(S) / ED DIAGNOSES  Final diagnoses:  Fall, initial encounter      NEW MEDICATIONS STARTED DURING THIS VISIT:  ED Discharge Orders    None          This chart was dictated using voice recognition software/Dragon. Despite best efforts to proofread, errors can occur which can change the meaning. Any change was purely unintentional.     Lannie Fields, PA-C 12/20/20 1858    Nena Polio, MD 12/20/20 2250

## 2020-12-20 NOTE — ED Triage Notes (Signed)
Patient reports fall on Thursday--rib fx and right shoulder pain.   States that with assistance she can get up but it is very painful. States she is here today for pain control. Patient walks with walker and is able to walk once she is assisted up.   Patient lives at home with husband. Has not been taking anything for pain.

## 2020-12-21 ENCOUNTER — Emergency Department: Payer: Medicare Other

## 2020-12-21 DIAGNOSIS — R0600 Dyspnea, unspecified: Secondary | ICD-10-CM | POA: Diagnosis not present

## 2020-12-21 DIAGNOSIS — R531 Weakness: Secondary | ICD-10-CM | POA: Diagnosis not present

## 2020-12-21 DIAGNOSIS — I517 Cardiomegaly: Secondary | ICD-10-CM | POA: Diagnosis not present

## 2020-12-21 DIAGNOSIS — J8410 Pulmonary fibrosis, unspecified: Secondary | ICD-10-CM | POA: Diagnosis not present

## 2020-12-21 LAB — RESP PANEL BY RT-PCR (FLU A&B, COVID) ARPGX2
Influenza A by PCR: NEGATIVE
Influenza B by PCR: NEGATIVE
SARS Coronavirus 2 by RT PCR: NEGATIVE

## 2020-12-21 LAB — TROPONIN I (HIGH SENSITIVITY)
Troponin I (High Sensitivity): 19 ng/L — ABNORMAL HIGH (ref <18)
Troponin I (High Sensitivity): 19 ng/L — ABNORMAL HIGH (ref <18)

## 2020-12-21 LAB — PROCALCITONIN: Procalcitonin: <0.1 ng/mL

## 2020-12-21 LAB — BRAIN NATRIURETIC PEPTIDE: B Natriuretic Peptide: 88.5 pg/mL (ref 0.0–100.0)

## 2020-12-21 MED ORDER — IOHEXOL 350 MG/ML SOLN
75.0000 mL | Freq: Once | INTRAVENOUS | Status: AC | PRN
  Administered 2020-12-21: 75 mL via INTRAVENOUS

## 2020-12-21 NOTE — TOC Progression Note (Signed)
Transition of Care [facility]) - Progression Note    Patient Details  Name: Melody Ochoa MRN: [id number] Date of Birth: 1940/12/28  [facility]) CM/SW Contact  Gregrey Bloyd, [place], [place] Phone Number: 12/21/2020, 4:17 PM  Clinical Narrative:    Patient will discharge home on O2. Saturation scores requested from nurse. Referral accepted by [employer]-spoke with Brenton Grills who will deliver tomorrow .   [name] [address] LCSW Transition of Care [phone]    Expected Discharge Plan: [place] Barriers to Discharge: No Barriers Identified  Expected Discharge Plan and Services Expected Discharge Plan: [place] In-house Referral: Clinical Social Work     Living arrangements for the past 2 months: [place]: PT [place]: [place] ([place]) Date HH Agency Contacted: 12/21/20 Time [place]: [phone] Representative spoke with at [place]: [place] ([place]) Interventions    Readmission Risk Interventions Readmission Risk Prevention Plan 06/15/2019  Post Dischage Appt Complete  Medication Screening Complete  Transportation Screening Complete  Some recent data might be hidden

## 2020-12-21 NOTE — ED Notes (Signed)
Pt sleeping. 

## 2020-12-21 NOTE — ED Notes (Signed)
Pt assisted to bathroom. One staff assist needed. Pt uses walker at baseline.

## 2020-12-21 NOTE — ED Notes (Signed)
Pt talking to SW on the phone.   Lunch tray at bedside.

## 2020-12-21 NOTE — ED Notes (Signed)
Pt urinated into bedpan. Able to clean self.

## 2020-12-21 NOTE — Discharge Instructions (Addendum)
You were seen by physical therapy and did not meet criteria for's rehab placement.  However your oxygen levels were dropping with walking secondary to your pulmonary fibrosis.  You need to wear your 2 L of oxygen all the time.  Social work is helping set up a portable oxygen tank for you and working to help you get extra help in the home.  Please return to the ER for any other concern

## 2020-12-21 NOTE — ED Notes (Signed)
Pt taken to CT at this time.

## 2020-12-21 NOTE — TOC Initial Note (Signed)
Transition of Care [facility]) - Initial/Assessment Note    Patient Details  Name: Melody Ochoa MRN: [id number] Date of Birth: 09-Oct-1941  Transition of Care [facility]) CM/SW Contact:    Elliot Gurney [place], [place] Phone Number: 12/21/2020, 10:50 AM  Clinical Narrative:                 This Education officer, museum spoke with patient's spouse, patient could not be reached in her room. Patient resides with spouse who assists with the majority of her care. Per patient's spouse, he does have [facility] coming in but was not sure of the name. Phone call was brief due to poor connection. Chart review completed, patient previously discharged with [facility] through [facility]. This social worker spoke with Corene Cornea who confirmed that patient  is open to them and services can resume however there is some concern that patient continues to fall and may need additional supervision in the home.  Transition of Care to continue to follow for discharge needs.  Hayzlee Mcsorley, LCSW Transition of Care [phone]    Expected Discharge Plan: [place] Barriers to Discharge: No Barriers Identified   Patient Goals and CMS Choice        Expected Discharge Plan and Services Expected Discharge Plan: [place] In-house Referral: Clinical Social Work     Living arrangements for the past 2 months: [place]: PT [place]: [place] ([facility]) Date HH Agency Contacted: 12/21/20 Time [place]: [phone] Representative spoke with at [place]  Prior Living Arrangements/Services Living arrangements for the past 2 months: [place] with:: Spouse   Do you feel safe going back to the place where you live?: Yes               Activities of Daily Living      Permission Sought/Granted                  Emotional Assessment              Admission diagnosis:  Lt side, fracture rib,  EMS Patient Active Problem List   Diagnosis Date Noted  . Ischemic stroke ([place]) 12/10/2020  . Chronic kidney disease, stage 3a ([place]) 12/09/2020  . Chronic respiratory failure with hypoxia ([place]) 12/09/2020  . Fall at home, initial encounter 12/09/2020  . Pain in right arm 12/09/2020  . Memory deficit 09/24/2020  . Pulmonary fibrosis ([place]) 09/14/2020  . Chronic cough 09/14/2020  . Arthritis of left sacroiliac joint 08/26/2020  . Chronic left SI joint pain 08/26/2020  . CKD (chronic kidney disease) 07/23/2020  . HTN (hypertension) 04/23/2020  . Positive fecal immunochemical test   . Angiodysplasia of intestinal tract   . Transient ischemic attack (TIA) 10/08/2019  . Transient right leg weakness 09/30/2019  . Peripheral neuropathy 08/10/2019  . Primary osteoarthritis of left shoulder 07/24/2019  . Elevated blood pressure reading 06/27/2019  . Left shoulder pain 05/04/2019  . Anemia 03/19/2019  . Long term current use of opiate analgesic 01/09/2019  . Foraminal stenosis of lumbar region 11/14/2018  . Chronic pain syndrome 09/12/2018  . SOB (shortness of breath) on exertion 08/11/2018  . Dysphagia 08/11/2018  . Cough 06/30/2018  . Rash 01/16/2018  . Hyperlipidemia 10/18/2017  . History of lumbar surgery 10/04/2017  . Depression,  recurrent ([place]) 09/27/2017  . Pulmonary nodules 09/27/2017  . History of CVA (cerebrovascular accident) 09/12/2017  . Cerebellar stroke, acute ([place]) 08/10/2017  . Primary osteoarthritis of left hip 02/28/2017  . Strain of left hip 02/28/2017  . B12 deficiency 12/21/2016  . Generalized OA 05/13/2016  . Status post total replacement of right hip 04/22/2016  . Personal history of colonic polyps   . Greater trochanteric bursitis of both hips 01/09/2016  . Prediabetes 08/04/2015  . Thoracic aortic atherosclerosis ([place]) 06/16/2015  . 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  . UTI (lower urinary tract infection) 11/22/2014  . Low back pain 11/22/2014  .  Spondylolisthesis of lumbar region 11/18/2014  . Lumbar radiculopathy 08/21/2014  . Degeneration of lumbar or lumbosacral intervertebral disc 08/21/2014  . Left hip pain 08/21/2014  . Osteopenia 06/23/2014  . Major depression in remission ([place]) 06/23/2014  . Anxiety 06/23/2014  . Menopause 06/23/2014   PCP:  Burnard Hawthorne, FNP Pharmacy:   [id number] - [place], [place] [address] [place] [address] Phone: [phone] Fax: [phone]     Social Determinants of Health (SDOH) Interventions    Readmission Risk Interventions Readmission Risk Prevention Plan 06/15/2019  Post Dischage Appt Complete  Medication Screening Complete  Transportation Screening Complete  Some recent data might be hidden

## 2020-12-21 NOTE — ED Notes (Signed)
Pt taken to decon shower and placed on bedpan to urinate. Cleaned with wipe.

## 2020-12-21 NOTE — ED Notes (Signed)
Pt stated she needed to urinate. Taken to [employer] and placed on bedpan. Pt able to wipe self.

## 2020-12-21 NOTE — ED Notes (Signed)
Pt assisted to bathroom. Refused to let this RN remain in bathroom with pt.

## 2020-12-21 NOTE — ED Notes (Signed)
Po fluids, kleenex provided to pt. Pt placed on fresh oxygen tank while in hallway bed.

## 2020-12-21 NOTE — ED Notes (Signed)
Pt resting in bed, NAD at this time.  

## 2020-12-21 NOTE — ED Notes (Signed)
Pt needed to have BM. Pt assisted to bathroom by 2 RN and walker. Pt ambulated well with walker but became weak when walked back to stretcher. Assisted back into stretcher by 2 RN. Pt oxygen after walking 91% on 2 L. Pt oxygen laying in bed 100% on 2 L.

## 2020-12-21 NOTE — ED Notes (Signed)
Pt taken to xray 

## 2020-12-21 NOTE — ED Notes (Signed)
Pt informed that EMS is not taking pts home today d/t the weather and that she would be spending the night in the ER and will get to go home tomorrow. Pt verbalized understanding.

## 2020-12-21 NOTE — TOC Progression Note (Signed)
Transition of Care [facility]) - Progression Note    Patient Details  Name: Melody Ochoa MRN: [id number] Date of Birth: 02/01/1941  [facility]) CM/SW Contact  Tattiana Fakhouri, [place], [place] Phone Number: 12/21/2020, 11:56 AM  Clinical Narrative:     Spoke to patient by phone today. Discussed recommendation for patient to return home with [facility] PT through [facility].  Patient's support network explored. Per patient, her husband is available to provide care. She also had a in home caregiver that unfortunately resigned last week. Patient is now looking for a new caregiver. Resources provided by phone for possible in hone care agencies that she can contact for additional in home support. Patient understands to wear her oxygen continuously. She would like to discharge home by ambulance if being discharged today.  Transition of Care to continue to follow for discharge needs  [facility], LCSW Transition of Care [phone]   Expected Discharge Plan: [place] Barriers to Discharge: No Barriers Identified  Expected Discharge Plan and Services Expected Discharge Plan: [place] In-house Referral: Clinical Social Work     Living arrangements for the past 2 months: [place]: PT [place]: [place] ([facility]) Date HH Agency Contacted: 12/21/20 Time [place]: [phone] Representative spoke with at [place]: [place] ([place]) Interventions    Readmission Risk Interventions Readmission Risk Prevention Plan 06/15/2019  Post Dischage Appt Complete  Medication Screening Complete  Transportation Screening Complete  Some recent data might be hidden

## 2020-12-21 NOTE — ED Provider Notes (Signed)
7:31 AM Assumed care for off going team.   Blood pressure (!) 145/71, pulse 75, temperature 98.7 F (37.1 C), temperature source Oral, resp. rate 16, height 5\' 4"  (1.626 m), weight 90.7 kg, SpO2 98 %.  See their HPI for full report but in brief patient was being assisted to the bathroom when she felt really weak and was assisted down to the floor.  She did not hit her head did not lose consciousness.  When she was returned back to the bed her oxygen sats were 85%.  At this time will order chest x-ray, repeat COVID swab, troponin, BNP, EKG.   =  EKG is normal sinus rate of 75, no ST elevation, some T wave flattening, prolonged QTC of 520  Discussed with patient further and she states that she supposed to be on 2 L of oxygen at nighttime and she uses it during the day as needed. When I read the physical therapy note it stated that she did desat into the 80s with ambulation with them as well. I suspect patient needs to be on 2 L chronically. I still think we should have a CT scan to make sure were not missing something like a pulmonary embolism.  Patient was seen by pulmonology on 11/9 for her pulmonary fibrosis and at that time they commented in their note that she required 2 L of oxygen.   Her CT PE was negative although concern for possible inflammatory versus infection but her procalcitonin was negative and she is afebrile with no cough a so I think that this is more likely chronic in nature.  Her cardiac markers were slightly elevated but unchanged on repeat.  She had no signs of heart failure.  I suspect that her shortness of breath is all secondary to her pulmonary fibrosis which is chronic in nature and that she needs to be on her 2 L of oxygen all the time and not just at nighttime.  I discussed with patient and explained to her that she needs to use her oxygen while ambulating as well.  She expressed understanding.  However she stated that she does not have a portable oxygen tank.  I  discussed with the social worker who stated that they will build to get that to her tomorrow due to weather.  At this time patient is requesting to go home and understands that she should try to use her oxygen until her portable tank gets there.  She states that she would like to go home with no at this time.  Nurse is documenting oxygen sats to get portable oxygen in coordination with social work.  Unfortunately she needs EMS home and no EMS transport out today. Will d/c tomorrow.    Vanessa Cary, MD 12/21/20 [phone]

## 2020-12-21 NOTE — ED Notes (Signed)
Pt pulled call light in bathroom. This RN went in to assist pt walking back to stretcher outside bathroom. Pt began to feel weak and was assisted into a seated position. Extra staff called. Pt able to be lifted back to standing and assisted to walk to stretcher. Upon placing pt back in [place], pt oxygen was 85%. Informed EDP and charge RN.

## 2020-12-21 NOTE — ED Notes (Signed)
Pt stated she has to use restroom. Pt assisted x1 minimally.

## 2020-12-21 NOTE — ED Notes (Signed)
Pt eating supper at this time. Sitting up on stretcher.

## 2020-12-21 NOTE — ED Notes (Signed)
Pt conversing with staff at this time. Pt very upset about being in hallway, apologized profusely to pt, she states "Surely there is someone in charge that I can complain to". Will get patient relations to see pt.

## 2020-12-21 NOTE — ED Notes (Signed)
Odis Hollingshead, rn to assume care of pt. Pt is sleeping.

## 2020-12-22 ENCOUNTER — Ambulatory Visit (INDEPENDENT_AMBULATORY_CARE_PROVIDER_SITE_OTHER): Payer: Medicare Other

## 2020-12-22 DIAGNOSIS — I639 Cerebral infarction, unspecified: Secondary | ICD-10-CM | POA: Diagnosis not present

## 2020-12-22 NOTE — ED Provider Notes (Signed)
-----------------------------------------   3:03 AM on 12/22/2020 -----------------------------------------   Blood pressure 134/77, pulse 80, temperature 98.1 F (36.7 C), temperature source Oral, resp. rate 16, height 1.626 m (5\' 4" ), weight 90.7 kg, SpO2 99 %.  The patient is calm and cooperative at this time.  There have been no acute events since the last update.  Awaiting disposition plan from Social Work team for her home oxygen plan.   Hinda Kehr, MD 12/22/20 [phone]

## 2020-12-22 NOTE — TOC Progression Note (Addendum)
Transition of Care [facility]) - Progression Note    Patient Details  Name: Melody Ochoa MRN: [id number] Date of Birth: 1941-10-18  [facility]) CM/SW [place], RN Phone Number: 12/22/2020, 12:19 PM  Clinical Narrative:    Spoke to patient at bedside. Patient is anxious to discharge home. Has home O2 already set up via [place]. Is hopeful for her husband to come pick her up and can bring her O2 tank from home. Patient has garage and would not have to walk in snow/ice to get inside.   RN CM called spouse at bedside and confirmed he would come pick her up for discharge. Patient spoke to spouse on the phone and confirmed O2 tank availability.   EDP and ED RN notified of discharge. Anticipate discharge in 1 hour. Spouse coming to ED lobby.    Expected Discharge Plan: [place] Barriers to Discharge: No Barriers Identified  Expected Discharge Plan and Services Expected Discharge Plan: [place] In-house Referral: Clinical Social Work     Living arrangements for the past 2 months: [place]: PT [place]: [place] ([facility]) Date HH Agency Contacted: 12/21/20 Time [place]: [phone] Representative spoke with at [place]: [place] ([place]) Interventions    Readmission Risk Interventions Readmission Risk Prevention Plan 06/15/2019  Post Dischage Appt Complete  Medication Screening Complete  Transportation Screening Complete  Some recent data might be hidden

## 2020-12-23 ENCOUNTER — Telehealth: Payer: Self-pay | Admitting: Family

## 2020-12-23 DIAGNOSIS — K219 Gastro-esophageal reflux disease without esophagitis: Secondary | ICD-10-CM | POA: Diagnosis not present

## 2020-12-23 DIAGNOSIS — K5790 Diverticulosis of intestine, part unspecified, without perforation or abscess without bleeding: Secondary | ICD-10-CM | POA: Diagnosis not present

## 2020-12-23 DIAGNOSIS — R42 Dizziness and giddiness: Secondary | ICD-10-CM | POA: Diagnosis not present

## 2020-12-23 DIAGNOSIS — F32A Depression, unspecified: Secondary | ICD-10-CM | POA: Diagnosis not present

## 2020-12-23 DIAGNOSIS — E785 Hyperlipidemia, unspecified: Secondary | ICD-10-CM | POA: Diagnosis not present

## 2020-12-23 DIAGNOSIS — D631 Anemia in chronic kidney disease: Secondary | ICD-10-CM | POA: Diagnosis not present

## 2020-12-23 DIAGNOSIS — I129 Hypertensive chronic kidney disease with stage 1 through stage 4 chronic kidney disease, or unspecified chronic kidney disease: Secondary | ICD-10-CM | POA: Diagnosis not present

## 2020-12-23 DIAGNOSIS — I7090 Unspecified atherosclerosis: Secondary | ICD-10-CM | POA: Diagnosis not present

## 2020-12-23 DIAGNOSIS — M549 Dorsalgia, unspecified: Secondary | ICD-10-CM | POA: Diagnosis not present

## 2020-12-23 DIAGNOSIS — J9611 Chronic respiratory failure with hypoxia: Secondary | ICD-10-CM | POA: Diagnosis not present

## 2020-12-23 DIAGNOSIS — N1831 Chronic kidney disease, stage 3a: Secondary | ICD-10-CM | POA: Diagnosis not present

## 2020-12-23 DIAGNOSIS — J8410 Pulmonary fibrosis, unspecified: Secondary | ICD-10-CM | POA: Diagnosis not present

## 2020-12-23 DIAGNOSIS — I69398 Other sequelae of cerebral infarction: Secondary | ICD-10-CM | POA: Diagnosis not present

## 2020-12-23 NOTE — Telephone Encounter (Signed)
Tharon Aquas from [employer], [phone]. Patient is out of the hospital for the 2rd time in a week, hospital sends pateint home each time. Home life is unstable. Patient is trying to hire an Aid. Husband is unable to take care of patient. Patient thinks her husband will drive her for her appt tomorrow, 12/25/2019. Tharon Aquas said she will fax verbal orders.

## 2020-12-24 ENCOUNTER — Other Ambulatory Visit: Payer: Self-pay | Admitting: Student in an Organized Health Care Education/Training Program

## 2020-12-24 ENCOUNTER — Ambulatory Visit
Admission: RE | Admit: 2020-12-24 | Discharge: 2020-12-24 | Disposition: A | Discharge status: Home / Self Care | Payer: Medicare Other | Source: Ambulatory Visit | Attending: Student in an Organized Health Care Education/Training Program | Admitting: Student in an Organized Health Care Education/Training Program

## 2020-12-24 ENCOUNTER — Encounter: Payer: Self-pay | Admitting: Family

## 2020-12-24 ENCOUNTER — Ambulatory Visit (INDEPENDENT_AMBULATORY_CARE_PROVIDER_SITE_OTHER): Payer: Medicare Other | Admitting: Family

## 2020-12-24 ENCOUNTER — Other Ambulatory Visit: Payer: Self-pay

## 2020-12-24 ENCOUNTER — Ambulatory Visit (HOSPITAL_BASED_OUTPATIENT_CLINIC_OR_DEPARTMENT_OTHER): Payer: Medicare Other | Admitting: Student in an Organized Health Care Education/Training Program

## 2020-12-24 ENCOUNTER — Encounter: Payer: Self-pay | Admitting: Student in an Organized Health Care Education/Training Program

## 2020-12-24 VITALS — BP 102/61 | HR 81 | Temp 97.5°F | Resp 18 | Ht 64.0 in | Wt 200.0 lb

## 2020-12-24 VITALS — BP 118/78 | HR 77 | Temp 97.4°F | Ht 64.02 in | Wt 188.8 lb

## 2020-12-24 DIAGNOSIS — G588 Other specified mononeuropathies: Secondary | ICD-10-CM

## 2020-12-24 DIAGNOSIS — K5790 Diverticulosis of intestine, part unspecified, without perforation or abscess without bleeding: Secondary | ICD-10-CM | POA: Diagnosis not present

## 2020-12-24 DIAGNOSIS — I639 Cerebral infarction, unspecified: Secondary | ICD-10-CM | POA: Diagnosis not present

## 2020-12-24 DIAGNOSIS — I10 Essential (primary) hypertension: Secondary | ICD-10-CM

## 2020-12-24 DIAGNOSIS — S2242XS Multiple fractures of ribs, left side, sequela: Secondary | ICD-10-CM

## 2020-12-24 DIAGNOSIS — K219 Gastro-esophageal reflux disease without esophagitis: Secondary | ICD-10-CM | POA: Diagnosis not present

## 2020-12-24 DIAGNOSIS — G894 Chronic pain syndrome: Secondary | ICD-10-CM

## 2020-12-24 DIAGNOSIS — D631 Anemia in chronic kidney disease: Secondary | ICD-10-CM | POA: Diagnosis not present

## 2020-12-24 DIAGNOSIS — J8410 Pulmonary fibrosis, unspecified: Secondary | ICD-10-CM

## 2020-12-24 DIAGNOSIS — S2242XA Multiple fractures of ribs, left side, initial encounter for closed fracture: Secondary | ICD-10-CM | POA: Diagnosis not present

## 2020-12-24 DIAGNOSIS — G8929 Other chronic pain: Secondary | ICD-10-CM | POA: Diagnosis not present

## 2020-12-24 DIAGNOSIS — I7090 Unspecified atherosclerosis: Secondary | ICD-10-CM | POA: Diagnosis not present

## 2020-12-24 DIAGNOSIS — R52 Pain, unspecified: Secondary | ICD-10-CM | POA: Insufficient documentation

## 2020-12-24 DIAGNOSIS — I129 Hypertensive chronic kidney disease with stage 1 through stage 4 chronic kidney disease, or unspecified chronic kidney disease: Secondary | ICD-10-CM | POA: Diagnosis not present

## 2020-12-24 DIAGNOSIS — F32A Depression, unspecified: Secondary | ICD-10-CM | POA: Diagnosis not present

## 2020-12-24 DIAGNOSIS — D3502 Benign neoplasm of left adrenal gland: Secondary | ICD-10-CM | POA: Diagnosis not present

## 2020-12-24 DIAGNOSIS — N1831 Chronic kidney disease, stage 3a: Secondary | ICD-10-CM | POA: Diagnosis not present

## 2020-12-24 DIAGNOSIS — M549 Dorsalgia, unspecified: Secondary | ICD-10-CM | POA: Diagnosis not present

## 2020-12-24 DIAGNOSIS — J9611 Chronic respiratory failure with hypoxia: Secondary | ICD-10-CM | POA: Diagnosis not present

## 2020-12-24 DIAGNOSIS — I69398 Other sequelae of cerebral infarction: Secondary | ICD-10-CM | POA: Diagnosis not present

## 2020-12-24 DIAGNOSIS — R42 Dizziness and giddiness: Secondary | ICD-10-CM | POA: Diagnosis not present

## 2020-12-24 DIAGNOSIS — D649 Anemia, unspecified: Secondary | ICD-10-CM

## 2020-12-24 DIAGNOSIS — M25511 Pain in right shoulder: Secondary | ICD-10-CM

## 2020-12-24 DIAGNOSIS — E785 Hyperlipidemia, unspecified: Secondary | ICD-10-CM | POA: Diagnosis not present

## 2020-12-24 LAB — IBC + FERRITIN
Ferritin: 125.4 ng/mL (ref 10.0–291.0)
Iron: 59 ug/dL (ref 42–145)
Saturation Ratios: 22.2 % (ref 20.0–50.0)
Transferrin: 190 mg/dL — ABNORMAL LOW (ref 212.0–360.0)

## 2020-12-24 MED ORDER — OXYCODONE-ACETAMINOPHEN 5-325 MG PO TABS: 1.0000 | ORAL_TABLET | Freq: Two times a day (BID) | ORAL | 0 refills | Status: AC | PRN

## 2020-12-24 NOTE — Progress Notes (Signed)
Nursing Pain Medication Assessment:  Safety precautions to be maintained throughout the outpatient stay will include: orient to surroundings, keep bed in low position, maintain call bell within reach at all times, provide assistance with transfer out of bed and ambulation.  Medication Inspection Compliance: Ms. Klatt did not comply with our request to bring her pills to be counted. She was reminded that bringing the medication bottles, even when empty, is a requirement.  Medication: None brought in. Pill/Patch Count: None available to be counted. Bottle Appearance: No container available. Did not bring bottle(s) to appointment. Filled Date: N/A Last Medication intake:  several months ago

## 2020-12-24 NOTE — Telephone Encounter (Signed)
Called [place] and left a message to call back or to refax over the paperwork as [name] does not have it.

## 2020-12-24 NOTE — Progress Notes (Signed)
Subjective:    Patient ID: Melody Ochoa, female    DOB: 13-Jul-1941, 80 y.o.   MRN: [id number]  CC: [name] is a 80 y.o. female who presents today for follow up, follow up hospitalization.   HPI: Primary concern today is consideration for skilled nursing facility and how to start that process.  Husband is unable to take care of her. She is worried that he has dementia.  She is able to dress and husband helps her as much as he can. She no longer cooks.  Handles her financials without assistance.   Left rib pain has improved. She is able to walk now.  Home home assessment yesterday for nursing, PT, OT. OT comes tomorrow.    Breathing 'is good' today. She is not on O2 while in office however using 2L continuously at home.   No sob, cough,chest/rib pain with inspiration.   HTN- compliant with amlodipine.   No cp  Chronic right shoulder pain- she follows with pain management, Dr Holley Raring and seeing him this afternoon. Compliant with lyrica , cymbalta as prescribed by him.   Compliant asa 81mg  , plavix. No bleeding or bruising.   Anemia- Heme positive 2021. Dr Allen Norris performed colonoscopy 02/2020.   No blood in stool. Doesn't take iron. Not vegtarian  No CKD. Crt. 0/86 4 days ago.     Returned to ED 12/21/19 presented for persistent right shoulder pain and left sided rib pain pain after fall. She was seen by [facility] 12/18/20 which showed left rib fractures ( unable to see this report). ED course was complicated by desaturations and weakness that resulted in being assisted to floor.  CXR showed chronic interstitial lung disease , no acute disease ; troponin 19.   CTA chest 1/ 5/22 negative for PE. Showed 2cm liver cyst , stable left adrenal adenoma  Social work consult and patient boarded in ED overnight   Hospitalization for CVA which resulted in fall on 12/09/20. She was discharged 12/12/20. Upon presentation via EMS to ED, she had been dizzy , speech slurred with  weakness in right leg. Fall on right side striking right arm and right shoulder.  During hospital course , started amlodipine 5mg , asa 81mg  started ( asa 3 weeks only). Device interrogation negative for atrial fib per DC notes.   Recommended SNF however patient preferred to go ome with Home health.   Follow up with cardiology and neurology Echo 12/10/20 showed EF 55% DG right arm, shoulder revealed no acute fracture. Ct c spine without acute pathology.  CT head showed mild diffuse cortical atrophy, old infarctions.  MRI brain showed small acute right cerebellar infarct;  multiple chronic infarcts US carotid mild atherosclerosis without stenosis CTA atherosclerotic disease, 30-50% stenosis of proximal left subclavian artery   TOC call 12/18/20      SOB-  2L o2  Small right cerebellar infarct 12/09/20.     HISTORY:  Past Medical History:  Diagnosis Date  . Arthritis   . Atheromatous plaque 10/18/2017  . Back pain   . Cancer ([place])    skin  . Depression   . Diverticulosis   . Dizziness    patient had episode of dizziness when came in room. hx past no dx  . GERD (gastroesophageal reflux disease)   . Heart murmur   . Hyperlipidemia 10/18/2017  . PONV (postoperative nausea and vomiting)    yrs ago  . Squamous cell carcinoma of skin 11/16/2016   L chest parasternal  . Squamous  cell carcinoma of skin 02/05/2020   L cheek inf to lat zygoma  . Stroke [facility])    Past Surgical History:  Procedure Laterality Date  . BACK SURGERY  13  . BREAST BIOPSY Bilateral    cores "years ago"  . BREAST EXCISIONAL BIOPSY Left 90's  . CATARACT EXTRACTION W/PHACO Left 12/20/2018   Procedure: CATARACT EXTRACTION PHACO AND INTRAOCULAR LENS PLACEMENT ([place]) LEFT TOPICAL;  Surgeon: Leandrew Koyanagi, MD;  Location: [place];  Service: Ophthalmology;  Laterality: Left;  . CATARACT EXTRACTION W/PHACO Right 01/24/2019   Procedure: CATARACT EXTRACTION PHACO AND INTRAOCULAR LENS PLACEMENT ([place])  RIGHT;  Surgeon: Leandrew Koyanagi, MD;  Location: [place];  Service: Ophthalmology;  Laterality: Right;  . CERVICAL DISC SURGERY  09  . COLONOSCOPY WITH PROPOFOL N/A 01/13/2016   Procedure: COLONOSCOPY WITH PROPOFOL;  Surgeon: [name], MD;  Location: [facility] ENDOSCOPY;  Service: Endoscopy;  Laterality: N/A;  . COLONOSCOPY WITH PROPOFOL N/A 02/19/2020   Procedure: COLONOSCOPY WITH PROPOFOL;  Surgeon: [name], MD;  Location: [facility] ENDOSCOPY;  Service: Gastroenterology;  Laterality: N/A;  . FACELIFT  94  . LOOP RECORDER INSERTION N/A 08/23/2017   Procedure: LOOP RECORDER INSERTION;  Surgeon: Thompson Grayer, MD;  Location: [place] CV LAB;  Service: Cardiovascular;  Laterality: N/A;  . TEE WITHOUT CARDIOVERSION N/A 08/12/2017   Procedure: TRANSESOPHAGEAL ECHOCARDIOGRAM (TEE);  Surgeon: Sueanne Margarita, MD;  Location: [facility] ENDOSCOPY;  Service: Cardiovascular;  Laterality: N/A;  . TONSILLECTOMY    . TOTAL HIP ARTHROPLASTY Right 04/22/2016   Procedure: TOTAL HIP ARTHROPLASTY;  Surgeon: Corky Mull, MD;  Location: [facility] ORS;  Service: Orthopedics;  Laterality: Right;   Family History  Problem Relation Age of Onset  . Lung cancer Father   . Aneurysm Brother   . Stroke Brother   . Diabetes Maternal Grandmother   . Kidney disease Maternal Grandfather   . Cushing syndrome Paternal Grandmother   . Dementia Paternal Grandfather   . Breast cancer Maternal Aunt 52    Allergies: Latex, Aleve [naproxen sodium], Aspirin, Celebrex [celecoxib], Effexor [venlafaxine], Gabapentin, Hydrocodone-homatropine, Ibuprofen, Mobic [meloxicam], Tape, Vioxx [rofecoxib], Other, and Tramadol Current Outpatient Medications on File Prior to Visit  Medication Sig Dispense Refill  . albuterol (VENTOLIN HFA) 108 (90 Base) MCG/ACT inhaler TAKE 2 PUFFS BY MOUTH EVERY 6 HOURS AS NEEDED FOR WHEEZE OR SHORTNESS OF BREATH (Patient taking differently: Inhale 2 puffs into the lungs every 6 (six) hours as needed for  wheezing or shortness of breath.) 8.5 each 2  . amLODipine (NORVASC) 5 MG tablet Take 1 tablet (5 mg total) by mouth daily. 30 tablet 0  . aspirin EC 81 MG EC tablet Take 1 tablet (81 mg total) by mouth daily for 21 days. Swallow whole. 21 tablet 0  . atorvastatin (LIPITOR) 80 MG tablet Take 1 tablet (80 mg total) by mouth daily. 90 tablet 0  . buPROPion (WELLBUTRIN XL) 300 MG 24 hr tablet Take 300 mg by mouth daily.     . clopidogrel (PLAVIX) 75 MG tablet TAKE 1 TABLET BY MOUTH EVERY DAY (Patient taking differently: Take 75 mg by mouth daily.) 90 tablet 3  . DULoxetine (CYMBALTA) 30 MG capsule Take 30 mg by mouth daily.    . famotidine (PEPCID) 20 MG tablet Take 20 mg by mouth at bedtime.   12  . fluticasone (FLONASE) 50 MCG/ACT nasal spray SPRAY 2 SPRAYS INTO EACH NOSTRIL EVERY DAY (Patient taking differently: Place 2 sprays into both nostrils daily as needed for  allergies.) 48 mL 1  . pregabalin (LYRICA) 50 MG capsule 50 mg during day, 100 mg at night (Patient taking differently: Take 50-100 mg by mouth See admin instructions. Take 1 capsule (50mg ) by mouth every morning and take 2 capsules (100mg ) by mouth at night) 90 capsule 3   No current facility-administered medications on file prior to visit.    Social History   Tobacco Use  . Smoking status: Former Smoker    Packs/day: 0.75    Years: 40.00    Pack years: 30.00    Types: Cigarettes    Quit date: 11/13/1996    Years since quitting: 24.1  . Smokeless tobacco: Never Used  Vaping Use  . Vaping Use: Never used  Substance Use Topics  . Alcohol use: Yes    Comment: occ wine  . Drug use: No    Review of Systems  Constitutional: Negative for chills and fever.  Respiratory: Negative for cough and shortness of breath.   Cardiovascular: Negative for chest pain, palpitations and leg swelling.  Gastrointestinal: Negative for nausea and vomiting.  Musculoskeletal: Positive for arthralgias (right shoulder).      Objective:    BP  118/78   Pulse 77   Temp (!) 97.4 F (36.3 C)   Ht 5' 4.02" (1.626 m)   Wt 188 lb 12.8 oz (85.6 kg)   SpO2 93%   BMI 32.39 kg/m  BP Readings from Last 3 Encounters:  12/24/20 102/61  12/25/20 118/78  12/22/20 111/65   Wt Readings from Last 3 Encounters:  12/24/20 200 lb (90.7 kg)  12/24/20 188 lb 12.8 oz (85.6 kg)  12/20/20 200 lb (90.7 kg)    Physical Exam Vitals reviewed.  Constitutional:      Appearance: She is well-developed and well-nourished.  Eyes:     Conjunctiva/sclera: Conjunctivae normal.  Cardiovascular:     Rate and Rhythm: Normal rate and regular rhythm.     Pulses: Normal pulses.     Heart sounds: Normal heart sounds.  Pulmonary:     Effort: Pulmonary effort is normal.     Breath sounds: Normal breath sounds. No wheezing, rhonchi or rales.  Skin:    General: Skin is warm and dry.  Neurological:     Mental Status: She is alert.  Psychiatric:        Mood and Affect: Mood and affect normal.        Speech: Speech normal.        Behavior: Behavior normal.        Thought Content: Thought content normal.        Assessment & Plan:   Problem List Items Addressed This Visit      Cardiovascular and Mediastinum   Cerebellar stroke, acute [facility])    Reviewed hospital course with patient. Medications reconciled. She is currently on asa 81mg  with plavix 75mg  qd. She is aware to stop ASA 81mg  on 01/02/21 after completing 3 weeks. She has upcoming appointment with Dr Manuella Ghazi. We discussed at length recent fall, and concern for caring for herself and possible Skilled Nursing. Working on resources for patient. She doesn't have cardiology appointment scheduled and we will offer to schedule for her.       HTN (hypertension)    Controlled. Continue norvasc 5mg .         Respiratory   Pulmonary fibrosis (HCC)    Breathing at baseline today. She is wearing 2L 02 continuously at home. Will follow.  Endocrine   Adenoma of left adrenal gland    Incidental  finding. Advised hormonal work up with endocrine.         Other   Anemia - Primary    Chronic, stable. Colonoscopy 02/2020. Iron stores normal. Will follow.       Relevant Orders   IBC + Ferritin (Completed)   Fecal occult blood, imunochemical   Right shoulder pain    Acute on chronic. Following with pain management whom prescribes lyrica, cymbalta. Will follow          I am having [name] maintain her buPROPion, famotidine, clopidogrel, fluticasone, DULoxetine, pregabalin, albuterol, aspirin, amLODipine, and atorvastatin.   No orders of the defined types were placed in this encounter.   Return precautions given.   Risks, benefits, and alternatives of the medications and treatment plan prescribed today were discussed, and patient expressed understanding.   Education regarding symptom management and diagnosis given to patient on AVS.  Continue to follow with Burnard Hawthorne, FNP for routine health maintenance.   [name] and I agreed with plan.   [name], FNP

## 2020-12-24 NOTE — Progress Notes (Signed)
PROVIDER NOTE: Information contained herein reflects review and annotations entered in association with encounter. Interpretation of such information and data should be left to medically-trained personnel. Information provided to patient can be located elsewhere in the medical record under "Patient Instructions". Document created using STT-dictation technology, any transcriptional errors that may result from process are unintentional.    Patient: Melody Ochoa  Service Category: E/M  Provider: Gillis Santa, MD  DOB: 1941-11-20  DOS: 12/24/2020  Specialty: Interventional Pain Management  MRN: [id number]  Setting: Ambulatory outpatient  PCP: Burnard Hawthorne, FNP  Type: Established Patient    Referring Provider: Burnard Hawthorne, FNP  Location: Office  Delivery: Face-to-face     HPI  Ms. Melody Ochoa, a 80 y.o. 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 Ms. Melody Ochoa primary complain today is Shoulder Pain (right) and Rib Fracture (Left- due to fall) Last encounter: My last encounter with her was on 12/18/2020. Pertinent problems: Ms. Melody Ochoa has Spondylolisthesis of lumbar region; Failed back surgical syndrome; Low back pain; History of lumbar surgery; Lumbar radiculopathy; Foraminal stenosis of lumbar region; and Left shoulder pain on their pertinent problem list. Pain Assessment: Severity of Chronic pain is reported as a 4 /10. Location: Other (Comment) (ribcage) Left/right shoulder. Onset: 1 to 4 weeks ago. Quality: Sharp,Aching,Constant. Timing: Constant. Modifying factor(s): rest. Vitals:  height is 5' 4" (1.626 m) and weight is 200 lb (90.7 kg). Her temporal temperature is 97.5 F (36.4 C) (abnormal). Her blood pressure is 102/61 and her pulse is 81. Her respiration is 18 and oxygen saturation is 94%.   Reason for encounter: new problems.    Patient sustained a fall on 12/20/20 resulting in left sided rib fractures (saw [facility] where they did  xrays of her ribs and told her she had fx). Having severe radiating pain along left thoracic/intercostal region. Is out of her Oxycodone is having increased pain along her left rib cage.  Pharmacotherapy Assessment   Analgesic: Percocet 5 mg daily as needed for breakthrough pain.  Does not take this regularly.  Last prescription was on 05/29/2020, quantity 45. Monitoring: [place] PMP: PDMP reviewed during this encounter.       Pharmacotherapy: No side-effects or adverse reactions reported. Compliance: No problems identified. Effectiveness: Clinically acceptable.  Hart Rochester, RN  12/24/2020  2:25 PM  Sign when Signing Visit Nursing Pain Medication Assessment:  Safety precautions to be maintained throughout the outpatient stay will include: orient to surroundings, keep bed in low position, maintain call bell within reach at all times, provide assistance with transfer out of bed and ambulation.  Medication Inspection Compliance: Ms. Melody Ochoa did not comply with our request to bring her pills to be counted. She was reminded that bringing the medication bottles, even when empty, is a requirement.  Medication: None brought in. Pill/Patch Count: None available to be counted. Bottle Appearance: No container available. Did not bring bottle(s) to appointment. Filled Date: N/A Last Medication intake:  several months ago    UDS:  Summary  Date Value Ref Range Status  01/23/2020 Note  Final    Comment:    ==================================================================== [employer] Select 13 (MW) ==================================================================== Test                             Result       Flag       Units   NO DRUGS DETECTED. ==================================================================== Test  Result    Flag   Units      Ref Range   Creatinine              219              mg/dL       >=20 ==================================================================== Declared Medications:  The flagging and interpretation on this report are based on the  following declared medications.  Unexpected results may arise from  inaccuracies in the declared medications.  **Note: The testing scope of this panel does not include the  following reported medications:  Albuterol  Amlodipine  Atorvastatin  Bupropion  Clopidogrel  Duloxetine  Famotidine  Fluticasone  Pregabalin  Tiotropium  Triamcinolone ==================================================================== For clinical consultation, please call [phone]. ====================================================================      ROS  Constitutional: Denies any fever or chills Gastrointestinal: No reported hemesis, hematochezia, vomiting, or acute GI distress Musculoskeletal: Bilateral shoulder, left-sided rib pain Neurological: No reported episodes of acute onset apraxia, aphasia, dysarthria, agnosia, amnesia, paralysis, loss of coordination, or loss of consciousness  Medication Review  DULoxetine, albuterol, amLODipine, aspirin, atorvastatin, buPROPion, clopidogrel, famotidine, fluticasone, oxyCODONE-acetaminophen, and pregabalin  History Review  Allergy: Ms. Melody Ochoa is allergic to latex, aleve [naproxen sodium], aspirin, celebrex [celecoxib], effexor [venlafaxine], gabapentin, hydrocodone-homatropine, ibuprofen, mobic [meloxicam], tape, vioxx [rofecoxib], other, and tramadol. Drug: Ms. Melody Ochoa  reports no history of drug use. Alcohol:  reports current alcohol use. Tobacco:  reports that she quit smoking about 24 years ago. Her smoking use included cigarettes. She has a 30.00 pack-year smoking history. She has never used smokeless tobacco. Social: Ms. Melody Ochoa  reports that she quit smoking about 24 years ago. Her smoking use included cigarettes. She has a 30.00 pack-year smoking history. She has never used  smokeless tobacco. She reports current alcohol use. She reports that she does not use drugs. Medical:  has a past medical history of Arthritis, Atheromatous plaque (10/18/2017), Back pain, Cancer ([place]), Depression, Diverticulosis, Dizziness, GERD (gastroesophageal reflux disease), Heart murmur, Hyperlipidemia (10/18/2017), PONV (postoperative nausea and vomiting), Squamous cell carcinoma of skin (11/16/2016), Squamous cell carcinoma of skin (02/05/2020), and Stroke ([place]). Surgical: Ms. Asch  has a past surgical history that includes Tonsillectomy; Facelift (89); Cervical disc surgery (09); Back surgery (12); Colonoscopy with propofol (N/A, 01/13/2016); Total hip arthroplasty (Right, 04/22/2016); TEE without cardioversion (N/A, 08/12/2017); LOOP RECORDER INSERTION (N/A, 08/23/2017); Breast excisional biopsy (Left, 90's); Breast biopsy (Bilateral); Cataract extraction w/PHACO (Left, 12/20/2018); Cataract extraction w/PHACO (Right, 01/24/2019); and Colonoscopy with propofol (N/A, 02/19/2020). Family: family history includes Aneurysm in her brother; Breast cancer (age of onset: 35) in her maternal aunt; Cushing syndrome in her paternal grandmother; Dementia in her paternal grandfather; Diabetes in her maternal grandmother; Kidney disease in her maternal grandfather; Lung cancer in her father; Stroke in her brother.  Laboratory Chemistry Profile   Renal Lab Results  Component Value Date   BUN 21 12/20/2020   CREATININE 1.05 (H) 12/20/2020   GFR 43.64 (L) 05/21/2020   GFRAA 59 (L) 03/11/2020   GFRNONAA 54 (L) 12/20/2020     Hepatic Lab Results  Component Value Date   AST 19 12/20/2020   ALT 13 12/20/2020   ALBUMIN 3.5 12/20/2020   ALKPHOS 58 12/20/2020     Electrolytes Lab Results  Component Value Date   NA 141 12/20/2020   K 3.1 (L) 12/20/2020   CL 104 12/20/2020   CALCIUM 9.0 12/20/2020     Bone No results found for: [place], [id number], [id number], [id number], [place], [place], [place],  TESTOFREE,  TESTOSTERONE   Inflammation (CRP: Acute Phase) (ESR: Chronic Phase) No results found for: CRP, ESRSEDRATE, LATICACIDVEN     Note: Above Lab results reviewed.  Recent Imaging Review  CT Angio Chest PE W and/or Wo Contrast CLINICAL DATA:  Weakness, high suspicion of pulmonary embolism.  EXAM: CT ANGIOGRAPHY CHEST WITH CONTRAST  TECHNIQUE: Multidetector CT imaging of the chest was performed using the standard protocol during bolus administration of intravenous contrast. Multiplanar CT image reconstructions and MIPs were obtained to evaluate the vascular anatomy.  CONTRAST:  16m OMNIPAQUE IOHEXOL 350 MG/ML SOLN  COMPARISON:  Chest CT 05/29/2020  FINDINGS: Cardiovascular: Mild stable cardiomegaly. Minimal calcified plaque over the left anterior descending and lateral circumflex coronary arteries. Thoracic aorta is normal in caliber. There is calcified plaque throughout the thoracic aorta. Pulmonary arterial system is well opacified and demonstrates no evidence of pulmonary emboli. Remaining vascular structures are unremarkable.  Mediastinum/Nodes: There is no significant mediastinal or hilar adenopathy. Remaining mediastinal structures are unremarkable.  Lungs/Pleura: Lungs are adequately inflated demonstrate evidence of patient's underlying chronic interstitial disease with patchy fibrosis and bronchiectatic change. There is mild hazy opacification associated with these patchy areas of fibrosis which may represent acute on chronic infectious or inflammatory process. No effusion.  Upper Abdomen: Calcified plaque over the abdominal aorta. 2 cm cyst over the inferior right lobe of the liver. Stable small left adrenal adenoma. Mild colonic diverticulosis.  Musculoskeletal: Degenerative change of the spine.  Review of the MIP images confirms the above findings.  IMPRESSION: 1. No evidence of pulmonary embolism. 2. Evidence of patient's underlying chronic  interstitial disease with patchy fibrosis and bronchiectatic change. Mild hazy opacification associated with these patchy areas of fibrosis which may be due to acute on chronic infectious or inflammatory process. 3. Stable cardiomegaly and minimal atherosclerotic coronary artery disease. 4. 2 cm liver cyst. Stable small left adrenal adenoma. 5. Colonic diverticulosis. 6. Aortic atherosclerosis.  Aortic Atherosclerosis (ICD10-I70.0).  Electronically Signed   By: DMarin OlpM.D.   On: 12/21/2020 10:13 DG Chest Portable 1 View CLINICAL DATA:  Dyspnea  EXAM: PORTABLE CHEST 1 VIEW  COMPARISON:  09/13/2020 chest radiograph.  FINDINGS: Loop recorder overlies the lower left lung. Bilateral posterior spinal fusion hardware in the cervical spine. Stable cardiomediastinal silhouette with normal heart size. No pneumothorax. No pleural effusion. Extensive patchy reticular opacities throughout the peripheral lungs with low lung volumes, similar. No pulmonary edema or superimposed acute consolidative airspace disease.  IMPRESSION: Chronic interstitial lung disease, consistent with UIP as described on 05/29/2020 chest CT study. No superimposed acute cardiopulmonary disease.  Electronically Signed   By: JIlona SorrelM.D.   On: 12/21/2020 08:13 Note: Reviewed        Physical Exam  General appearance: Well nourished, well developed, and well hydrated. In no apparent acute distress Mental status: Alert, oriented x 3 (person, place, & time)       Respiratory: No evidence of acute respiratory distress Eyes: PERLA Vitals: BP 102/61   Pulse 81   Temp (!) 97.5 F (36.4 C) (Temporal)   Resp 18   Ht 5' 4" (1.626 m)   Wt 200 lb (90.7 kg)   SpO2 94%   BMI 34.33 kg/m  BMI: Estimated body mass index is 34.33 kg/m as calculated from the following:   Height as of this encounter: 5' 4" (1.626 m).   Weight as of this encounter: 200 lb (90.7 kg). Ideal: Ideal body weight: 54.7 kg (120 lb  9.5 oz) Adjusted ideal body  weight: 69.1 kg (152 lb 5.7 oz)  Pain along left subcostal region with radiation along her thoracic and abdominal region. Bilateral shoulder pain, limited range of motion  Assessment   Status Diagnosis  Having a Flare-up Persistent Having a Flare-up 1. Intercostal neuralgia   2. Closed fracture of multiple ribs of left side, sequela   3. Chronic pain syndrome       Plan of Care  Ms. TISHANNA DUNFORD has a current medication list which includes the following long-term medication(s): albuterol, amlodipine, atorvastatin, bupropion, clopidogrel, duloxetine, famotidine, fluticasone, and pregabalin.  1.  Short prescription of oxycodone as below for acute on chronic pain related to rib fracture 2.  Scheduled for left-sided intercostal nerve block under fluoroscopy.  Patient instructed to stop Plavix 70s prior 3.  X-rays of ribs  Pharmacotherapy (Medications Ordered): Meds ordered this encounter  Medications  . oxyCODONE-acetaminophen (PERCOCET) 5-325 MG tablet    Sig: Take 1 tablet by mouth 2 (two) times daily as needed for severe pain. Must last 30 days.    Dispense:  45 tablet    Refill:  0    Chronic Pain. (STOP Act - Not applicable). Fill one day early if closed on scheduled refill date.   Orders:  Orders Placed This Encounter  Procedures  . INTERCOSTAL NERVE BLOCK    Standing Status:   Future    Standing Expiration Date:   06/23/2021    Scheduling Instructions:     Side: LEFT     Sedation: Without Sedation.     Timeframe: ASAA    Order Specific Question:   Where will this procedure be performed?    Answer:   [facility]  . DG Ribs Bilateral    Standing Status:   Future    Standing Expiration Date:   12/24/2021    Order Specific Question:   Reason for Exam (SYMPTOM  OR DIAGNOSIS REQUIRED)    Answer:   left rib pain after fall    Order Specific Question:   Preferred imaging location?    Answer:   [facility]   Follow-up plan:    Return in about 2 weeks (around 01/07/2021) for left ICNB w/o sedation (stop Plavix 7 days prior).     Status post left shoulder intra-articular injection, posterior approach on 08/08/2019, 09/17/2019- helped, repeat PRN.   Left sacroiliac joint injection 09/24/2020          Recent Visits Date Type Provider Dept  10/21/20 Telemedicine Gillis Santa, MD [facility]  Showing recent visits within past 90 days and meeting all other requirements Today's Visits Date Type Provider Dept  12/24/20 Office Visit Gillis Santa, MD [facility]  Showing today's visits and meeting all other requirements Future Appointments No visits were found meeting these conditions. Showing future appointments within next 90 days and meeting all other requirements  I discussed the assessment and treatment plan with the patient. The patient was provided an opportunity to ask questions and all were answered. The patient agreed with the plan and demonstrated an understanding of the instructions.  Patient advised to call back or seek an in-person evaluation if the symptoms or condition worsens.  Duration of encounter: 30 minutes.  Note by: Gillis Santa, MD Date: 12/24/2020; Time: 2:46 PM

## 2020-12-24 NOTE — Patient Instructions (Addendum)
Stop Plavix 7 days priorPain Management Discharge Instructions  General Discharge Instructions :  If you need to reach your doctor call: Monday-Friday 8:00 am - 4:00 pm at [phone] or toll free [phone].  After clinic hours [phone] to have operator reach doctor.  Bring all of your medication bottles to all your appointments in the pain clinic.  To cancel or reschedule your appointment with Pain Management please remember to call 24 hours in advance to avoid a fee.  Refer to the educational materials which you have been given on: General Risks, I had my Procedure. Discharge Instructions, Post Sedation.  Post Procedure Instructions:  The drugs you were given will stay in your system until tomorrow, so for the next 24 hours you should not drive, make any legal decisions or drink any alcoholic beverages.  You may eat anything you prefer, but it is better to start with liquids then soups and crackers, and gradually work up to solid foods.  Please notify your doctor immediately if you have any unusual bleeding, trouble breathing or pain that is not related to your normal pain.  Depending on the type of procedure that was done, some parts of your body may feel week and/or numb.  This usually clears up by tonight or the next day.  Walk with the use of an assistive device or accompanied by an adult for the 24 hours.  You may use ice on the affected area for the first 24 hours.  Put ice in a Ziploc bag and cover with a towel and place against area 15 minutes on 15 minutes off.  You may switch to heat after 24 hours.Intercostal Nerve Block Patient Information  Description: The twelve intercostal nerves arise from the first thru twelfth thoracic nerve roots.  The nerve begins at the spine and wraps around the body, lying in a groove underneath each rib.  Each intercostal nerve innervates a specific strip of skin and body walk of the abdomen and chest.  Therefore, injuries of the chest  wall or abdominal wall result in pain that is transmitted back to the brian via the intercostal nerves.  Examples of such injuries include rib fractures and incisions for lung and gall bladder surgery.  Occasionally, pain may persist long after an injury or surgical incision secondary to inflammation and irritation of the intercostal nerve.  The longstanding pain is known as intercostal neuralgia.  An intercostal nerve block is preformed to eliminate pain either temporarily or permanently.  A small needle is placed below the rib and local anesthetic (like Novocaine) and possibly steroid is injected.  Usually 2-4 intercostal nerves are blocked at a time depending on the problem.  The patient will experience a slight "pin-prick" sensation for each injection.  Shortly thereafter, the strip of skin that is innervated by the blocked intercostal nerve will feel numb.  Persistent pain that is only temporarily relieved with local anesthetic may require a more permanent block. This procedure is called Cryoneurolysis and entails placing a small probe beneath the rib to freeze the nerve.  Conditions that may be treated by intercostal nerve blocks:   Rib fractures  Longstanding pain from surgery of the chest or abdomen (intercostal neuralgia)  Pain from chest tubes  Pain from trauma to the chest  Preparation for the injections:  1. Do not eat any solid food or dairy products within 8 hours of your appointment. 2. You may drink clear liquids up to 3 hours before appointment.  Clear liquids include water, black  coffee, juice or soda.  No milk or cream please. 3. You may take your regular medication, including pain medications, with a sip of water before your appointment.  Diabetics should hold regular insulin (if take separately) and take 1/2 normal NPH dose the morning of the procedure.   Carry some sugar containing items with you to your appointment. 4. A driver must accompany you and be prepared to drive you  home after your procedure. 5. Bring all your current medications with you. 6. An IV may be inserted and sedation may be given at the discretion of the physician. 7. A blood pressure cuff, EKG and other monitors will often be applied during the procedure.  Some patients may need to have extra oxygen administered for a short period. 8. You will be asked to provide medical information, including your allergies, prior to the procedure.  We must know immediately if you are taking blood thinners (like Coumadin/Warfarin) or if you are allergic to IV iodine contrast (dye). We must know if you could possible be pregnant.  Possible side-effects:   Bleeding from needle site  Infection (rare)  Nerve injury (rare)  Numbness & tingling of skin  Collapsed lung requiring chest tube (rare)  Local anesthetic toxicity (rare)  Light-headedness (temporary)  Pain at injection site (several days)  Decreased blood pressure (temporary)  Shortness of breath  Jittery/shaking sensation (temporary)  Call if you experience:   Difficulty breathing or hives (go directly to the emergency room)  Redness, inflammation or drainage at the injection site  Severe pain at the site of the injection  Any new symptoms which are concerning   Please note:  Your pain may subside immediately but may return several hours after the injection.  Often, more than one injection is required to reduce the pain. Also, if several temporary blocks with local anesthetic are ineffective, a more permanent block with cryolysis may be necessary.  This will be discussed with you should this be the case.  If you have any questions, please call [phone] [place] 7 DAYS PRIOR TO PROCEDURE.

## 2020-12-24 NOTE — Telephone Encounter (Signed)
Call Home health frankie What orders has she faxed? Please ensure she has what is needed to start home health

## 2020-12-24 NOTE — Patient Instructions (Addendum)
Stop Aspirin 3 weeks after hospitalization. You may STOP on 01/02/21.   We will work on Skilled Nursing and I will call you recommendations here so you may call.   Please let me know if you need anything at all.

## 2020-12-25 ENCOUNTER — Telehealth: Payer: Self-pay

## 2020-12-25 ENCOUNTER — Telehealth: Payer: Self-pay | Admitting: Family

## 2020-12-25 DIAGNOSIS — J8410 Pulmonary fibrosis, unspecified: Secondary | ICD-10-CM | POA: Diagnosis not present

## 2020-12-25 DIAGNOSIS — R42 Dizziness and giddiness: Secondary | ICD-10-CM | POA: Diagnosis not present

## 2020-12-25 DIAGNOSIS — N1831 Chronic kidney disease, stage 3a: Secondary | ICD-10-CM | POA: Diagnosis not present

## 2020-12-25 DIAGNOSIS — K219 Gastro-esophageal reflux disease without esophagitis: Secondary | ICD-10-CM | POA: Diagnosis not present

## 2020-12-25 DIAGNOSIS — D3502 Benign neoplasm of left adrenal gland: Secondary | ICD-10-CM | POA: Insufficient documentation

## 2020-12-25 DIAGNOSIS — I7090 Unspecified atherosclerosis: Secondary | ICD-10-CM | POA: Diagnosis not present

## 2020-12-25 DIAGNOSIS — J9611 Chronic respiratory failure with hypoxia: Secondary | ICD-10-CM | POA: Diagnosis not present

## 2020-12-25 DIAGNOSIS — E785 Hyperlipidemia, unspecified: Secondary | ICD-10-CM | POA: Diagnosis not present

## 2020-12-25 DIAGNOSIS — I69398 Other sequelae of cerebral infarction: Secondary | ICD-10-CM | POA: Diagnosis not present

## 2020-12-25 DIAGNOSIS — M549 Dorsalgia, unspecified: Secondary | ICD-10-CM | POA: Diagnosis not present

## 2020-12-25 DIAGNOSIS — D631 Anemia in chronic kidney disease: Secondary | ICD-10-CM | POA: Diagnosis not present

## 2020-12-25 DIAGNOSIS — K5790 Diverticulosis of intestine, part unspecified, without perforation or abscess without bleeding: Secondary | ICD-10-CM | POA: Diagnosis not present

## 2020-12-25 DIAGNOSIS — I129 Hypertensive chronic kidney disease with stage 1 through stage 4 chronic kidney disease, or unspecified chronic kidney disease: Secondary | ICD-10-CM | POA: Diagnosis not present

## 2020-12-25 DIAGNOSIS — F32A Depression, unspecified: Secondary | ICD-10-CM | POA: Diagnosis not present

## 2020-12-25 LAB — CUP PACEART REMOTE DEVICE CHECK
Date Time Interrogation Session: 20220115235614
Implantable Pulse Generator Implant Date: 20180918

## 2020-12-25 NOTE — Assessment & Plan Note (Signed)
Controlled. Continue norvasc 5mg 

## 2020-12-25 NOTE — Assessment & Plan Note (Signed)
Breathing at baseline today. She is wearing 2L 02 continuously at home. Will follow.

## 2020-12-25 NOTE — Assessment & Plan Note (Signed)
Reviewed hospital course with patient. Medications reconciled. She is currently on asa 81mg  with plavix 75mg  qd. She is aware to stop ASA 81mg  on 01/02/21 after completing 3 weeks. She has upcoming appointment with Dr Manuella Ghazi. We discussed at length recent fall, and concern for caring for herself and possible Skilled Nursing. Working on resources for patient. She doesn't have cardiology appointment scheduled and we will offer to schedule for her.

## 2020-12-25 NOTE — Telephone Encounter (Signed)
Radiology called report from imaging of left and right ribs. Mildly displaced fractures of antero lateral left 7th, 8th, 9th ribs.

## 2020-12-25 NOTE — Assessment & Plan Note (Addendum)
Acute on chronic. Following with pain management whom prescribes lyrica, cymbalta. Will follow

## 2020-12-25 NOTE — Telephone Encounter (Signed)
Dr Holley Raring notified.

## 2020-12-25 NOTE — Assessment & Plan Note (Signed)
Incidental finding. Advised hormonal work up with endocrine.

## 2020-12-25 NOTE — Telephone Encounter (Signed)
Call pt  Reviewing images from recent ED visit in with CT chest revealed left adrenal adenoma.   I dont this mentioned with last CT abdomen and pelvis in 2019.  I know she has a lot going but the adrenals are a gland on top of the kidney produce hormones that help regulate your metabolism, immune system, blood pressure. When this is found, I also ask patients to have a consult with endocrine for hormonal evaluation.  Is she willing to consider this?  Please let her know that she also has a 2cm liver cyst. This is commonly found as since < 4cm, no follow up needed.

## 2020-12-25 NOTE — Assessment & Plan Note (Signed)
Chronic, stable. Colonoscopy 02/2020. Iron stores normal. Will follow.

## 2020-12-26 ENCOUNTER — Other Ambulatory Visit: Payer: Self-pay | Admitting: Family

## 2020-12-26 DIAGNOSIS — D3502 Benign neoplasm of left adrenal gland: Secondary | ICD-10-CM

## 2020-12-26 NOTE — Progress Notes (Signed)
Ref to endo placed

## 2020-12-26 NOTE — Progress Notes (Signed)
Pt stated that the appointment on 2/21 that is scheduled is with Dr. Caryl Comes. It appears that he reads her cardiac event loop reorder?

## 2020-12-26 NOTE — Telephone Encounter (Signed)
I spoke with patient & she is willing to be evaluated by endo. She said that she was years ago & was unsure who she saw there. At that time no further f/u was needed. She is willing to see them to make sure nothing has changed. She knows that she should hear back about an appointment within a week to 10 days.

## 2020-12-29 ENCOUNTER — Telehealth: Payer: Self-pay | Admitting: Family

## 2020-12-29 DIAGNOSIS — M549 Dorsalgia, unspecified: Secondary | ICD-10-CM | POA: Diagnosis not present

## 2020-12-29 DIAGNOSIS — F32A Depression, unspecified: Secondary | ICD-10-CM | POA: Diagnosis not present

## 2020-12-29 DIAGNOSIS — K5790 Diverticulosis of intestine, part unspecified, without perforation or abscess without bleeding: Secondary | ICD-10-CM | POA: Diagnosis not present

## 2020-12-29 DIAGNOSIS — J9611 Chronic respiratory failure with hypoxia: Secondary | ICD-10-CM | POA: Diagnosis not present

## 2020-12-29 DIAGNOSIS — K219 Gastro-esophageal reflux disease without esophagitis: Secondary | ICD-10-CM | POA: Diagnosis not present

## 2020-12-29 DIAGNOSIS — I69398 Other sequelae of cerebral infarction: Secondary | ICD-10-CM | POA: Diagnosis not present

## 2020-12-29 DIAGNOSIS — E785 Hyperlipidemia, unspecified: Secondary | ICD-10-CM | POA: Diagnosis not present

## 2020-12-29 DIAGNOSIS — I7090 Unspecified atherosclerosis: Secondary | ICD-10-CM | POA: Diagnosis not present

## 2020-12-29 DIAGNOSIS — I129 Hypertensive chronic kidney disease with stage 1 through stage 4 chronic kidney disease, or unspecified chronic kidney disease: Secondary | ICD-10-CM | POA: Diagnosis not present

## 2020-12-29 DIAGNOSIS — D631 Anemia in chronic kidney disease: Secondary | ICD-10-CM | POA: Diagnosis not present

## 2020-12-29 DIAGNOSIS — N1831 Chronic kidney disease, stage 3a: Secondary | ICD-10-CM | POA: Diagnosis not present

## 2020-12-29 DIAGNOSIS — J8410 Pulmonary fibrosis, unspecified: Secondary | ICD-10-CM | POA: Diagnosis not present

## 2020-12-29 DIAGNOSIS — R42 Dizziness and giddiness: Secondary | ICD-10-CM | POA: Diagnosis not present

## 2020-12-29 NOTE — Telephone Encounter (Signed)
Cell phone is off & unable to leave message.

## 2020-12-29 NOTE — Telephone Encounter (Signed)
Call pt  I spoke with [facility] lead nurse whom had a couple of suggestions regarding help/care for patinet  For Skilled Nursing facilities, there are many, however she can certainly reach out to   [facility] [address] [place] [address]  If she would like help coming into the home for help with ADLs and opt to remain at home.   Always [phone] [place] [phone]

## 2020-12-29 NOTE — Telephone Encounter (Signed)
-----   Message from Nanci Pina, RN sent at 12/26/2020 11:07 AM EST ----- Regarding: RE: Skilled nursing resources  Is patient requesting home Skill nursing, Or would they benefit from and agency such as [facility] , Always [facility] [facility], Usually skill nursing is for patient that require medical treatment at home for wound I V ABX, something the requires an RN or LPN skills. A home health referral would be a good stat if hey need skill nursing.If its just needing a person to help with activities daily living then I recommend [facility] and patient would need to contact an request. Let me know if I can assist [name] I really just need more information on their needs. Thanks Juliann Pulse   ----- Message ----- From: Burnard Hawthorne, FNP Sent: 12/25/2020  10:34 PM EST To: Nanci Pina, RN, De Hollingshead, RPH-CPP Subject: Skilled nursing resources                      Juliann Pulse and Catie,   Would love your advice - as not sure where to turn.   This patient is having more and more falls at home and husband has dementia making it hard to help her.  She asked me about skilled nursing.   Can I make a referral to social work for this ?  And how?   [name]

## 2020-12-29 NOTE — Telephone Encounter (Signed)
I tried to call patient but was not able to reach anyone on home phone.

## 2020-12-29 NOTE — Telephone Encounter (Signed)
I spoke with patient & she stated that she would try calling [facility] as well as [facility]. She will keep [place] updated. She was unsure if she was ready for skilled nursing facility & which one she would want to go to if so.

## 2020-12-29 NOTE — Telephone Encounter (Signed)
Pt returned your call.  

## 2020-12-30 ENCOUNTER — Telehealth: Payer: Self-pay | Admitting: Family

## 2020-12-30 DIAGNOSIS — K219 Gastro-esophageal reflux disease without esophagitis: Secondary | ICD-10-CM | POA: Diagnosis not present

## 2020-12-30 DIAGNOSIS — I7090 Unspecified atherosclerosis: Secondary | ICD-10-CM | POA: Diagnosis not present

## 2020-12-30 DIAGNOSIS — E785 Hyperlipidemia, unspecified: Secondary | ICD-10-CM | POA: Diagnosis not present

## 2020-12-30 DIAGNOSIS — I69398 Other sequelae of cerebral infarction: Secondary | ICD-10-CM | POA: Diagnosis not present

## 2020-12-30 DIAGNOSIS — R42 Dizziness and giddiness: Secondary | ICD-10-CM | POA: Diagnosis not present

## 2020-12-30 DIAGNOSIS — N1831 Chronic kidney disease, stage 3a: Secondary | ICD-10-CM | POA: Diagnosis not present

## 2020-12-30 DIAGNOSIS — M549 Dorsalgia, unspecified: Secondary | ICD-10-CM | POA: Diagnosis not present

## 2020-12-30 DIAGNOSIS — I129 Hypertensive chronic kidney disease with stage 1 through stage 4 chronic kidney disease, or unspecified chronic kidney disease: Secondary | ICD-10-CM | POA: Diagnosis not present

## 2020-12-30 DIAGNOSIS — D631 Anemia in chronic kidney disease: Secondary | ICD-10-CM | POA: Diagnosis not present

## 2020-12-30 DIAGNOSIS — J8410 Pulmonary fibrosis, unspecified: Secondary | ICD-10-CM | POA: Diagnosis not present

## 2020-12-30 DIAGNOSIS — F32A Depression, unspecified: Secondary | ICD-10-CM | POA: Diagnosis not present

## 2020-12-30 DIAGNOSIS — J9611 Chronic respiratory failure with hypoxia: Secondary | ICD-10-CM | POA: Diagnosis not present

## 2020-12-30 DIAGNOSIS — K5790 Diverticulosis of intestine, part unspecified, without perforation or abscess without bleeding: Secondary | ICD-10-CM | POA: Diagnosis not present

## 2020-12-30 NOTE — Telephone Encounter (Signed)
-----   Message from De Hollingshead, [place] sent at 12/30/2020  7:40 AM EST ----- Regarding: RE: Skilled nursing resources  Got the OK to place a referral.   Talk to patient about a referral for [facility] - a nurse and social worker can help navigate this situation.   Referral is the same as for me - except you would choose the option for [facility] patients for a nurse and social worker.   Catie ----- Message ----- From: Nanci Pina, RN Sent: 12/29/2020   3:21 PM EST To: De Hollingshead, RPH-CPP Subject: RE: Skilled nursing resources                  Thanks Catie if I can help let me know. ----- Message ----- From: De Hollingshead, RPH-CPP Sent: 12/29/2020   3:18 PM EST To: Nanci Pina, RN, Burnard Hawthorne, FNP Subject: RE: Skilled nursing resources                  Hey,   Since she's [facility] eligible, I'm also reaching out to the central [place] [facility] team to see if we can place a referral for a RN CM and SW.   Will keep you in the loop.   Catie ----- Message ----- From: Burnard Hawthorne, FNP Sent: 12/25/2020  10:34 PM EST To: Nanci Pina, RN, De Hollingshead, RPH-CPP Subject: Skilled nursing resources                      Juliann Pulse and Catie,   Would love your advice - as not sure where to turn.   This patient is having more and more falls at home and husband has dementia making it hard to help her.  She asked me about skilled nursing.   Can I make a referral to social work for this ?  And how?   [name]

## 2020-12-30 NOTE — Telephone Encounter (Signed)
Call pt one more time please  One more piece I have found out See notes from catie below I can place a referral and nurse, social work can help her navigate  Let me know if she would like ref and I will place

## 2020-12-30 NOTE — Telephone Encounter (Signed)
Pt was willing to have referral placed. She knows to be on lookout for phone call & it may take a week or so to hear from someone.

## 2021-01-01 ENCOUNTER — Other Ambulatory Visit: Payer: Self-pay | Admitting: Family

## 2021-01-01 DIAGNOSIS — R42 Dizziness and giddiness: Secondary | ICD-10-CM | POA: Diagnosis not present

## 2021-01-01 DIAGNOSIS — F32A Depression, unspecified: Secondary | ICD-10-CM | POA: Diagnosis not present

## 2021-01-01 DIAGNOSIS — I7090 Unspecified atherosclerosis: Secondary | ICD-10-CM | POA: Diagnosis not present

## 2021-01-01 DIAGNOSIS — K5790 Diverticulosis of intestine, part unspecified, without perforation or abscess without bleeding: Secondary | ICD-10-CM | POA: Diagnosis not present

## 2021-01-01 DIAGNOSIS — J8410 Pulmonary fibrosis, unspecified: Secondary | ICD-10-CM | POA: Diagnosis not present

## 2021-01-01 DIAGNOSIS — I639 Cerebral infarction, unspecified: Secondary | ICD-10-CM

## 2021-01-01 DIAGNOSIS — I69398 Other sequelae of cerebral infarction: Secondary | ICD-10-CM | POA: Diagnosis not present

## 2021-01-01 DIAGNOSIS — E785 Hyperlipidemia, unspecified: Secondary | ICD-10-CM | POA: Diagnosis not present

## 2021-01-01 DIAGNOSIS — N1831 Chronic kidney disease, stage 3a: Secondary | ICD-10-CM | POA: Diagnosis not present

## 2021-01-01 DIAGNOSIS — M549 Dorsalgia, unspecified: Secondary | ICD-10-CM | POA: Diagnosis not present

## 2021-01-01 DIAGNOSIS — K219 Gastro-esophageal reflux disease without esophagitis: Secondary | ICD-10-CM | POA: Diagnosis not present

## 2021-01-01 DIAGNOSIS — J9611 Chronic respiratory failure with hypoxia: Secondary | ICD-10-CM | POA: Diagnosis not present

## 2021-01-01 DIAGNOSIS — D631 Anemia in chronic kidney disease: Secondary | ICD-10-CM | POA: Diagnosis not present

## 2021-01-01 DIAGNOSIS — I129 Hypertensive chronic kidney disease with stage 1 through stage 4 chronic kidney disease, or unspecified chronic kidney disease: Secondary | ICD-10-CM | POA: Diagnosis not present

## 2021-01-04 ENCOUNTER — Other Ambulatory Visit: Payer: Self-pay | Admitting: Internal Medicine

## 2021-01-05 DIAGNOSIS — E785 Hyperlipidemia, unspecified: Secondary | ICD-10-CM | POA: Diagnosis not present

## 2021-01-05 DIAGNOSIS — K5790 Diverticulosis of intestine, part unspecified, without perforation or abscess without bleeding: Secondary | ICD-10-CM | POA: Diagnosis not present

## 2021-01-05 DIAGNOSIS — K219 Gastro-esophageal reflux disease without esophagitis: Secondary | ICD-10-CM | POA: Diagnosis not present

## 2021-01-05 DIAGNOSIS — N1831 Chronic kidney disease, stage 3a: Secondary | ICD-10-CM | POA: Diagnosis not present

## 2021-01-05 DIAGNOSIS — J9611 Chronic respiratory failure with hypoxia: Secondary | ICD-10-CM | POA: Diagnosis not present

## 2021-01-05 DIAGNOSIS — I7090 Unspecified atherosclerosis: Secondary | ICD-10-CM | POA: Diagnosis not present

## 2021-01-05 DIAGNOSIS — J8410 Pulmonary fibrosis, unspecified: Secondary | ICD-10-CM | POA: Diagnosis not present

## 2021-01-05 DIAGNOSIS — D631 Anemia in chronic kidney disease: Secondary | ICD-10-CM | POA: Diagnosis not present

## 2021-01-05 DIAGNOSIS — I69398 Other sequelae of cerebral infarction: Secondary | ICD-10-CM | POA: Diagnosis not present

## 2021-01-05 DIAGNOSIS — F32A Depression, unspecified: Secondary | ICD-10-CM | POA: Diagnosis not present

## 2021-01-05 DIAGNOSIS — R42 Dizziness and giddiness: Secondary | ICD-10-CM | POA: Diagnosis not present

## 2021-01-05 DIAGNOSIS — M549 Dorsalgia, unspecified: Secondary | ICD-10-CM | POA: Diagnosis not present

## 2021-01-05 DIAGNOSIS — I129 Hypertensive chronic kidney disease with stage 1 through stage 4 chronic kidney disease, or unspecified chronic kidney disease: Secondary | ICD-10-CM | POA: Diagnosis not present

## 2021-01-06 DIAGNOSIS — J8410 Pulmonary fibrosis, unspecified: Secondary | ICD-10-CM | POA: Diagnosis not present

## 2021-01-06 DIAGNOSIS — N1831 Chronic kidney disease, stage 3a: Secondary | ICD-10-CM | POA: Diagnosis not present

## 2021-01-06 DIAGNOSIS — J9611 Chronic respiratory failure with hypoxia: Secondary | ICD-10-CM | POA: Diagnosis not present

## 2021-01-06 DIAGNOSIS — I7090 Unspecified atherosclerosis: Secondary | ICD-10-CM | POA: Diagnosis not present

## 2021-01-06 DIAGNOSIS — K219 Gastro-esophageal reflux disease without esophagitis: Secondary | ICD-10-CM | POA: Diagnosis not present

## 2021-01-06 DIAGNOSIS — M549 Dorsalgia, unspecified: Secondary | ICD-10-CM | POA: Diagnosis not present

## 2021-01-06 DIAGNOSIS — K5790 Diverticulosis of intestine, part unspecified, without perforation or abscess without bleeding: Secondary | ICD-10-CM | POA: Diagnosis not present

## 2021-01-06 DIAGNOSIS — E785 Hyperlipidemia, unspecified: Secondary | ICD-10-CM | POA: Diagnosis not present

## 2021-01-06 DIAGNOSIS — F32A Depression, unspecified: Secondary | ICD-10-CM | POA: Diagnosis not present

## 2021-01-06 DIAGNOSIS — D631 Anemia in chronic kidney disease: Secondary | ICD-10-CM | POA: Diagnosis not present

## 2021-01-06 DIAGNOSIS — I129 Hypertensive chronic kidney disease with stage 1 through stage 4 chronic kidney disease, or unspecified chronic kidney disease: Secondary | ICD-10-CM | POA: Diagnosis not present

## 2021-01-06 DIAGNOSIS — R42 Dizziness and giddiness: Secondary | ICD-10-CM | POA: Diagnosis not present

## 2021-01-06 DIAGNOSIS — I69398 Other sequelae of cerebral infarction: Secondary | ICD-10-CM | POA: Diagnosis not present

## 2021-01-06 NOTE — Progress Notes (Signed)
Carelink Summary Report / Loop Recorder 

## 2021-01-07 ENCOUNTER — Ambulatory Visit: Payer: Medicare Other | Admitting: Student in an Organized Health Care Education/Training Program

## 2021-01-09 ENCOUNTER — Telehealth: Payer: Self-pay | Admitting: Family

## 2021-01-09 DIAGNOSIS — K219 Gastro-esophageal reflux disease without esophagitis: Secondary | ICD-10-CM | POA: Diagnosis not present

## 2021-01-09 DIAGNOSIS — J8410 Pulmonary fibrosis, unspecified: Secondary | ICD-10-CM | POA: Diagnosis not present

## 2021-01-09 DIAGNOSIS — E785 Hyperlipidemia, unspecified: Secondary | ICD-10-CM | POA: Diagnosis not present

## 2021-01-09 DIAGNOSIS — J9611 Chronic respiratory failure with hypoxia: Secondary | ICD-10-CM | POA: Diagnosis not present

## 2021-01-09 DIAGNOSIS — K5790 Diverticulosis of intestine, part unspecified, without perforation or abscess without bleeding: Secondary | ICD-10-CM | POA: Diagnosis not present

## 2021-01-09 DIAGNOSIS — M549 Dorsalgia, unspecified: Secondary | ICD-10-CM | POA: Diagnosis not present

## 2021-01-09 DIAGNOSIS — D631 Anemia in chronic kidney disease: Secondary | ICD-10-CM | POA: Diagnosis not present

## 2021-01-09 DIAGNOSIS — I7090 Unspecified atherosclerosis: Secondary | ICD-10-CM | POA: Diagnosis not present

## 2021-01-09 DIAGNOSIS — I69398 Other sequelae of cerebral infarction: Secondary | ICD-10-CM | POA: Diagnosis not present

## 2021-01-09 DIAGNOSIS — N1831 Chronic kidney disease, stage 3a: Secondary | ICD-10-CM | POA: Diagnosis not present

## 2021-01-09 DIAGNOSIS — I129 Hypertensive chronic kidney disease with stage 1 through stage 4 chronic kidney disease, or unspecified chronic kidney disease: Secondary | ICD-10-CM | POA: Diagnosis not present

## 2021-01-09 DIAGNOSIS — F32A Depression, unspecified: Secondary | ICD-10-CM | POA: Diagnosis not present

## 2021-01-09 DIAGNOSIS — R42 Dizziness and giddiness: Secondary | ICD-10-CM | POA: Diagnosis not present

## 2021-01-09 NOTE — Telephone Encounter (Signed)
noted 

## 2021-01-09 NOTE — Telephone Encounter (Signed)
Rejection Reason - Patient was No Show" Tiburcio Bash said on Jan 09, 2021 8:57 AM [facility]

## 2021-01-13 DIAGNOSIS — J8410 Pulmonary fibrosis, unspecified: Secondary | ICD-10-CM | POA: Diagnosis not present

## 2021-01-15 ENCOUNTER — Telehealth: Payer: Self-pay | Admitting: *Deleted

## 2021-01-15 DIAGNOSIS — R42 Dizziness and giddiness: Secondary | ICD-10-CM | POA: Diagnosis not present

## 2021-01-15 DIAGNOSIS — I69398 Other sequelae of cerebral infarction: Secondary | ICD-10-CM | POA: Diagnosis not present

## 2021-01-15 DIAGNOSIS — N1831 Chronic kidney disease, stage 3a: Secondary | ICD-10-CM | POA: Diagnosis not present

## 2021-01-15 DIAGNOSIS — J8410 Pulmonary fibrosis, unspecified: Secondary | ICD-10-CM | POA: Diagnosis not present

## 2021-01-15 DIAGNOSIS — E785 Hyperlipidemia, unspecified: Secondary | ICD-10-CM | POA: Diagnosis not present

## 2021-01-15 DIAGNOSIS — I129 Hypertensive chronic kidney disease with stage 1 through stage 4 chronic kidney disease, or unspecified chronic kidney disease: Secondary | ICD-10-CM | POA: Diagnosis not present

## 2021-01-15 DIAGNOSIS — K219 Gastro-esophageal reflux disease without esophagitis: Secondary | ICD-10-CM | POA: Diagnosis not present

## 2021-01-15 DIAGNOSIS — J9611 Chronic respiratory failure with hypoxia: Secondary | ICD-10-CM | POA: Diagnosis not present

## 2021-01-15 DIAGNOSIS — K5790 Diverticulosis of intestine, part unspecified, without perforation or abscess without bleeding: Secondary | ICD-10-CM | POA: Diagnosis not present

## 2021-01-15 DIAGNOSIS — D631 Anemia in chronic kidney disease: Secondary | ICD-10-CM | POA: Diagnosis not present

## 2021-01-15 DIAGNOSIS — M549 Dorsalgia, unspecified: Secondary | ICD-10-CM | POA: Diagnosis not present

## 2021-01-15 DIAGNOSIS — F32A Depression, unspecified: Secondary | ICD-10-CM | POA: Diagnosis not present

## 2021-01-15 DIAGNOSIS — I7090 Unspecified atherosclerosis: Secondary | ICD-10-CM | POA: Diagnosis not present

## 2021-01-15 NOTE — Chronic Care Management (AMB) (Signed)
  Chronic Care Management   Note  01/15/2021 Name: Melody Ochoa MRN: [id number] DOB: 06-15-1941  Melody Ochoa is a 80 y.o. year old female who is a primary care patient of Burnard Hawthorne, FNP. I reached out to [name] by phone today in response to a referral sent by [name] PCP, Burnard Hawthorne, FNP.  [name] was given information about Chronic Care Management services today including:  1. CCM service includes personalized support from designated clinical staff supervised by her physician, including individualized plan of care and coordination with other care providers 2. 24/7 contact phone numbers for assistance for urgent and routine care needs. 3. Service will only be billed when office clinical staff spend 20 minutes or more in a month to coordinate care. 4. Only one practitioner may furnish and bill the service in a calendar month. 5. The patient may stop CCM services at any time (effective at the end of the month) by phone call to the office staff. 6. The patient will be responsible for cost sharing (co-pay) of up to 20% of the service fee (after annual deductible is met).  Patient agreed to services and verbal consent obtained.   Follow up plan: Telephone appointment with care management team member scheduled for:01/16/2021  [place], [facility]  Direct Dial [phone]

## 2021-01-16 ENCOUNTER — Telehealth: Payer: Medicare Other | Admitting: *Deleted

## 2021-01-17 ENCOUNTER — Other Ambulatory Visit: Payer: Self-pay | Admitting: Family Medicine

## 2021-01-17 ENCOUNTER — Other Ambulatory Visit: Payer: Self-pay | Admitting: Internal Medicine

## 2021-01-19 ENCOUNTER — Encounter: Payer: Self-pay | Admitting: *Deleted

## 2021-01-19 ENCOUNTER — Ambulatory Visit (INDEPENDENT_AMBULATORY_CARE_PROVIDER_SITE_OTHER): Payer: Medicare Other | Admitting: *Deleted

## 2021-01-19 DIAGNOSIS — I639 Cerebral infarction, unspecified: Secondary | ICD-10-CM

## 2021-01-19 DIAGNOSIS — R296 Repeated falls: Secondary | ICD-10-CM

## 2021-01-19 DIAGNOSIS — M1612 Unilateral primary osteoarthritis, left hip: Secondary | ICD-10-CM | POA: Diagnosis not present

## 2021-01-19 DIAGNOSIS — M5137 Other intervertebral disc degeneration, lumbosacral region: Secondary | ICD-10-CM

## 2021-01-19 NOTE — Chronic Care Management (AMB) (Addendum)
Chronic Care Management    Clinical Social Work Note  01/19/2021 Name: Melody Ochoa MRN: [id number] DOB: 05/09/1941  Melody Ochoa is a 80 y.o. year old female who is a primary care patient of Burnard Hawthorne, FNP. The CCM team was consulted to assist the patient with chronic disease management and/or care coordination needs related to: Level of Care Concerns (frequent falls and inability to perform activities of daily living independently).  Engaged with patient by telephone for initial visit in response to provider referral for social work chronic care management and care coordination services.  Consent to Services:  The patient was given the following information about Chronic Care Management services today, agreed to services, and gave verbal consent: 1. CCM service includes personalized support from designated clinical staff supervised by the primary care provider, including individualized plan of care and coordination with other care providers 2. 24/7 contact phone numbers for assistance for urgent and routine care needs. 3. Service will only be billed when office clinical staff spend 20 minutes or more in a month to coordinate care. 4. Only one practitioner may furnish and bill the service in a calendar month. 5.The patient may stop CCM services at any time (effective at the end of the month) by phone call to the office staff. 6. The patient will be responsible for cost sharing (co-pay) of up to 20% of the service fee (after annual deductible is met). Patient agreed to services and consent obtained. Assessment: Review of patient past medical history, allergies, medications, and health status, including review of relevant consultants reports was performed today as part of a comprehensive evaluation and provision of chronic care management and care coordination services.    SDOH (Social Determinants of Health) assessments and interventions performed:  SDOH Interventions   Flowsheet Row  Most Recent Value  SDOH Interventions   Food Insecurity Interventions Intervention Not Indicated  Financial Strain Interventions Intervention Not Indicated  Housing Interventions Intervention Not Indicated  Physical Activity Interventions Intervention Not Indicated  Stress Interventions Intervention Not Indicated  Social Connections Interventions Intervention Not Indicated  Transportation Interventions Intervention Not Indicated  Alcohol Brief Interventions/Follow-up AUDIT Score <7 follow-up not indicated       Advanced Directives Status: Not ready or willing to discuss.  CCM Care Plan  Allergies  Allergen Reactions  . Latex Itching  . Aleve [Naproxen Sodium] Other (See Comments)    Gi upset  . Aspirin Other (See Comments)    Stomach pain-aggravates diverticulosis  . Celebrex [Celecoxib] Other (See Comments)    Dizziness   . Effexor [Venlafaxine] Other (See Comments)    Hot flashes   . Gabapentin Other (See Comments)    "Night terrors"  . Hydrocodone-Homatropine Diarrhea  . Ibuprofen Other (See Comments)    Gi upset   . Mobic [Meloxicam]     Stomach upset  . Tape Itching and Other (See Comments)    Itchy blisters  . Vioxx [Rofecoxib] Other (See Comments)    Gi upset  . Other Rash and Other (See Comments)    bandaides  . Tramadol Other (See Comments)    hallucinations    Outpatient Encounter Medications as of 01/19/2021  Medication Sig  . albuterol (VENTOLIN HFA) 108 (90 Base) MCG/ACT inhaler TAKE 2 PUFFS BY MOUTH EVERY 6 HOURS AS NEEDED FOR WHEEZE OR SHORTNESS OF BREATH (Patient taking differently: Inhale 2 puffs into the lungs every 6 (six) hours as needed for wheezing or shortness of breath.)  . amLODipine (NORVASC) 5 MG  tablet TAKE 1 TABLET (5 MG TOTAL) BY MOUTH DAILY.  Marland Kitchen aspirin 81 MG EC tablet TAKE 1 TABLET (81 MG TOTAL) BY MOUTH DAILY FOR 21 DAYS. SWALLOW WHOLE.  Marland Kitchen atorvastatin (LIPITOR) 80 MG tablet Take 1 tablet (80 mg total) by mouth daily.  Marland Kitchen buPROPion  (WELLBUTRIN XL) 300 MG 24 hr tablet Take 300 mg by mouth daily.   . clopidogrel (PLAVIX) 75 MG tablet TAKE 1 TABLET BY MOUTH EVERY DAY (Patient taking differently: Take 75 mg by mouth daily.)  . DULoxetine (CYMBALTA) 30 MG capsule Take 30 mg by mouth daily.  . famotidine (PEPCID) 20 MG tablet Take 20 mg by mouth at bedtime.   . fluticasone (FLONASE) 50 MCG/ACT nasal spray SPRAY 2 SPRAYS INTO EACH NOSTRIL EVERY DAY (Patient taking differently: Place 2 sprays into both nostrils daily as needed for allergies.)  . oxyCODONE-acetaminophen (PERCOCET) 5-325 MG tablet Take 1 tablet by mouth 2 (two) times daily as needed for severe pain. Must last 30 days.  . pregabalin (LYRICA) 50 MG capsule 50 mg during day, 100 mg at night (Patient taking differently: Take 50-100 mg by mouth See admin instructions. Take 1 capsule ($RemoveBe'50mg'uGzDykVFD$ ) by mouth every morning and take 2 capsules ($RemoveBef'100mg'VeMZLMUcRj$ ) by mouth at night)   No facility-administered encounter medications on file as of 01/19/2021.    Patient Active Problem List   Diagnosis Date Noted  . Adenoma of left adrenal gland 12/25/2020  . Ischemic stroke ([place]) 12/10/2020  . Chronic kidney disease, stage 3a ([place]) 12/09/2020  . Chronic respiratory failure with hypoxia ([place]) 12/09/2020  . Fall at home, initial encounter 12/09/2020  . Right shoulder pain 12/09/2020  . Memory deficit 09/24/2020  . Pulmonary fibrosis ([place]) 09/14/2020  . Chronic cough 09/14/2020  . Arthritis of left sacroiliac joint 08/26/2020  . Chronic left SI joint pain 08/26/2020  . CKD (chronic kidney disease) 07/23/2020  . HTN (hypertension) 04/23/2020  . Positive fecal immunochemical test   . Angiodysplasia of intestinal tract   . Transient ischemic attack (TIA) 10/08/2019  . Transient right leg weakness 09/30/2019  . Peripheral neuropathy 08/10/2019  . Primary osteoarthritis of left shoulder 07/24/2019  . Elevated blood pressure reading 06/27/2019  . Left shoulder pain 05/04/2019  . Anemia  03/19/2019  . Long term current use of opiate analgesic 01/09/2019  . Foraminal stenosis of lumbar region 11/14/2018  . Chronic pain syndrome 09/12/2018  . SOB (shortness of breath) on exertion 08/11/2018  . Dysphagia 08/11/2018  . Cough 06/30/2018  . Rash 01/16/2018  . Hyperlipidemia 10/18/2017  . History of lumbar surgery 10/04/2017  . Depression, recurrent ([place]) 09/27/2017  . Pulmonary nodules 09/27/2017  . History of CVA (cerebrovascular accident) 09/12/2017  . Cerebellar stroke, acute ([place]) 08/10/2017  . Primary osteoarthritis of left hip 02/28/2017  . Strain of left hip 02/28/2017  . B12 deficiency 12/21/2016  . Generalized OA 05/13/2016  . Status post total replacement of right hip 04/22/2016  . Personal history of colonic polyps   . Greater trochanteric bursitis of both hips 01/09/2016  . Prediabetes 08/04/2015  . Thoracic aortic atherosclerosis ([place]) 06/16/2015  . 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  . UTI (lower urinary tract infection) 11/22/2014  . Low back pain 11/22/2014  . Spondylolisthesis of lumbar region 11/18/2014  . Lumbar radiculopathy 08/21/2014  . Degeneration of lumbar or lumbosacral intervertebral disc 08/21/2014  . Left hip pain 08/21/2014  . Osteopenia 06/23/2014  . Major depression in remission ([place]) 06/23/2014  . Anxiety 06/23/2014  . Menopause 06/23/2014  Conditions to be addressed/monitored: Ischemic Stroke, Memory Deficits, Degeneration of Lumbar Disc, Primary Osteoarthritis of Left Hip.   Care Plan : Reduce Risk for Falls and Obtain Assistance with ADL's  Updates made by Francis Gaines, LCSW since 01/19/2021 12:00 AM    Problem: Fall Risk and Inability to Perform Activities of Daily Living Independently.     Goal:  Reduce Risk for Falls and Improve Quality of Life through [place].   Note:   Current barriers:    Unable to perform activities of daily living independently - needs additional assistance,  support, and resources in order to meet this unmet need.  Frequent falls - continue to receive home health physical therapy to provide strengthening, mobility, conditioning, and safety techniques to reduce fall risk.    Knowledge deficits related to available community agencies and resources - information and application provided for Aid and Attendance Benefits through [employer].   Clinical Goals: Over the next 90 days, patient will demonstrate improved health management independence as evidenced by having in-home care services in place to assist with activities of daily living and reduced risk for falls. Interventions : . Assessed needs, level of care concerns, basic eligibility and provided education on Aid and Attendance Benefits through [employer]. . Reviewed community support options (skilled nursing placement, long-term care assisted living placement, and in-home care/respite services).  . Provided information and applications for Aid and Attendance Benefits through [employer]. Nash Dimmer with appropriate clinical care team members regarding patient needs.   Discussed plans with patient for ongoing care management follow up and provided patient with direct       contact information for care management team.  Advised patient to gather all necessary paperwork and documentation necessary to submit with application for Aid and Attendance Benefits through [employer]. . Other interventions provided: problem solving and resource sharing. Marland Kitchen Collaboration with PCP regarding development and update of comprehensive plan of care as evidenced by provider attestation and co-signature.   Encourage physical activity, such as performance of self-care at highest level of ability, strength and balance exercise program, and provision of appropriate assistive devices: continue home health physical therapy and utilize walker when ambulating without  assistance.  Patient Goals/Self-Care Activities:  . As we discussed, begin to gather all of James's military documents required to submit with the application for Aid and Attendance Benefits, through [employer]. . Check your mailbox daily for a packet of resource information and application for Aid and Attendance Benefits, mailed to your home address by CSW on 01/19/2021. . Once received, review the following list of documents and consult with CSW if you have questions, or need assistance with application completion and/or submission:        How to Apply for Aid and Attendance Examination for Housebound Status or Permanent Need for Regular Aid and Attendance  Statement in Support of Claim Information and instructions to Help You Complete the Authorization to [place] to a Third Party Aid and Attendance Application Why is this important?    Knowing how and where to find help for yourself or family in your neighborhood and community is an important skill.   You will want to take some steps to learn how.  Follow Up Plan:  Patient is agreeable to having CSW contact her again on 01/26/2021, at [place], Marta Lamas, [place]    #  [phone]  

## 2021-01-20 DIAGNOSIS — R42 Dizziness and giddiness: Secondary | ICD-10-CM | POA: Diagnosis not present

## 2021-01-20 DIAGNOSIS — I129 Hypertensive chronic kidney disease with stage 1 through stage 4 chronic kidney disease, or unspecified chronic kidney disease: Secondary | ICD-10-CM | POA: Diagnosis not present

## 2021-01-20 DIAGNOSIS — E785 Hyperlipidemia, unspecified: Secondary | ICD-10-CM | POA: Diagnosis not present

## 2021-01-20 DIAGNOSIS — F32A Depression, unspecified: Secondary | ICD-10-CM | POA: Diagnosis not present

## 2021-01-20 DIAGNOSIS — K5790 Diverticulosis of intestine, part unspecified, without perforation or abscess without bleeding: Secondary | ICD-10-CM | POA: Diagnosis not present

## 2021-01-20 DIAGNOSIS — K219 Gastro-esophageal reflux disease without esophagitis: Secondary | ICD-10-CM | POA: Diagnosis not present

## 2021-01-20 DIAGNOSIS — I7090 Unspecified atherosclerosis: Secondary | ICD-10-CM | POA: Diagnosis not present

## 2021-01-20 DIAGNOSIS — I69398 Other sequelae of cerebral infarction: Secondary | ICD-10-CM | POA: Diagnosis not present

## 2021-01-20 DIAGNOSIS — J9611 Chronic respiratory failure with hypoxia: Secondary | ICD-10-CM | POA: Diagnosis not present

## 2021-01-20 DIAGNOSIS — N1831 Chronic kidney disease, stage 3a: Secondary | ICD-10-CM | POA: Diagnosis not present

## 2021-01-20 DIAGNOSIS — J8410 Pulmonary fibrosis, unspecified: Secondary | ICD-10-CM | POA: Diagnosis not present

## 2021-01-20 DIAGNOSIS — D631 Anemia in chronic kidney disease: Secondary | ICD-10-CM | POA: Diagnosis not present

## 2021-01-20 DIAGNOSIS — M549 Dorsalgia, unspecified: Secondary | ICD-10-CM | POA: Diagnosis not present

## 2021-01-20 NOTE — Patient Instructions (Addendum)
Visit Information  PATIENT GOALS: Goals Addressed            This Visit's Progress   . Apply for Aid and Attendance Benefits   On track    Timeframe:  Long-Range Goal Priority:  Medium Start Date:  01/19/2021                           Expected End Date:  04/18/2021                      Follow Up Date 01/26/2021    . As we discussed, begin to gather all of James's military documents required to submit with the application for Aid and Attendance Benefits, through [employer]. . Check your mailbox daily for a packet of resource information and application for Aid and Attendance Benefits, mailed to your home address by CSW on 01/19/2021. . Once received, review the following list of documents and consult with CSW if you have questions, or need assistance with application completion and/or submission:        How to Apply for Aid and Attendance Examination for Housebound Status or Permanent Need for Regular Aid and Attendance  Statement in Support of Claim Information and instructions to Help You Complete the Authorization to [place] to a Third Party Aid and Attendance Application  Why is this important?    Knowing how and where to find help for yourself or family in your neighborhood and community is an important skill.   You will want to take some steps to learn how.          The patient verbalized understanding of instructions, educational materials, and care plan provided today and agreed to receive a mailed copy of patient instructions, educational materials, and care plan.   The care management team will reach out to the patient again over the next week, on 01/26/2021.  [place], Joanna Davis, [place] # [phone]       Understanding Your Risk for Falls Each year, millions of people have serious injuries from falls. It is important to understand your risk for falling. Talk with your health  care provider about your risk and what you can do to lower it. There are actions you can take at home to lower your risk and prevent falls. If you do have a serious fall, make sure to tell your health care provider. Falling once raises your risk of falling again. How can falls affect me? Serious injuries from falls are common. These include:  Broken bones, such as hip fractures.  Head injuries, such as traumatic brain injuries (TBI) or concussion. A fear of falling can cause you to avoid activities and stay at home. This can make your muscles weaker and actually raise your risk for a fall. What can increase my risk? There are a number of risk factors that increase your risk for falling. The more risk factors you have, the higher your risk of falling. Serious injuries from a fall happen most often to people older than age 68. Children and young adults ages 39-29 are also at higher risk. Common risk factors include:  Weakness in the lower body.  Lack (deficiency) of vitamin D.  Being generally weak or confused due to long-term (chronic) illness.  Dizziness or balance problems.  Poor vision.  Medicines that cause dizziness or drowsiness. These can include medicines for your blood pressure, heart, anxiety, insomnia, or  edema, as well as pain medicines and muscle relaxants. Other risk factors include:  Drinking alcohol.  Having had a fall in the past.  Having depression.  Having foot pain or wearing improper footwear.  Working at a dangerous job.  Having any of the following in your home: ? Tripping hazards, such as floor clutter or loose rugs. ? Poor lighting. ? Pets.  Having dementia or memory loss. What actions can I take to lower my risk of falling? Physical activity Maintain physical fitness. Do strength and balance exercises. Consider taking a regular class to build strength and balance. Yoga and tai chi are good options. Vision Have your eyes checked every year and  your vision prescription updated as needed. Walking aids and footwear  Wear nonskid shoes. Do not wear high heels.  Do not walk around the house in socks or slippers.  Use a cane or walker as told by your health care provider. Home safety  Attach secure railings on both sides of your stairs.  Install grab bars for your tub, shower, and toilet. Use a bath mat in your tub or shower.  Use good lighting in all rooms. Keep a flashlight near your bed.  Make sure there is a clear path from your bed to the bathroom. Use night-lights.  Do not use throw rugs. Make sure all carpeting is taped or tacked down securely.  Remove all clutter from walkways and stairways, including extension cords.  Repair uneven or broken steps.  Avoid walking on icy or slippery surfaces. Walk on the grass instead of on icy or slick sidewalks. Use ice melt to get rid of ice on walkways.  Use a cordless phone.      Questions to ask your health care provider  Can you help me check my risk for a fall?  Do any of my medicines make me more likely to fall?  Should I take a vitamin D supplement?  What exercises can I do to improve my strength and balance?  Should I make an appointment to have my vision checked?  Do I need a bone density test to check for weak bones or osteoporosis?  Would it help to use a cane or a walker? Where to find more information  Centers for Disease Control and Prevention, STEADI: http://www.wolf.info/  Community-Based Fall Prevention Programs: http://www.wolf.info/  National Institute on Aging: http://kim-miller.com/ Contact a health care provider if:  You fall at home.  You are afraid of falling at home.  You feel weak, drowsy, or dizzy. Summary  Serious injuries from a fall happen most often to people older than age 76. Children and young adults ages 70-29 are also at higher risk.  Talk with your health care provider about your risks for falling and how to lower those risks.  Taking certain  precautions at home can lower your risk for falling.  If you fall, always tell your health care provider. This information is not intended to replace advice given to you by your health care provider. Make sure you discuss any questions you have with your health care provider. Document Revised: 06/25/2020 Document Reviewed: 06/25/2020 Elsevier Patient Education  [place].

## 2021-01-21 ENCOUNTER — Ambulatory Visit: Payer: Medicare Other | Admitting: Student in an Organized Health Care Education/Training Program

## 2021-01-21 DIAGNOSIS — J8410 Pulmonary fibrosis, unspecified: Secondary | ICD-10-CM | POA: Diagnosis not present

## 2021-01-22 ENCOUNTER — Telehealth: Payer: Self-pay | Admitting: Family

## 2021-01-22 DIAGNOSIS — R42 Dizziness and giddiness: Secondary | ICD-10-CM | POA: Diagnosis not present

## 2021-01-22 DIAGNOSIS — K5790 Diverticulosis of intestine, part unspecified, without perforation or abscess without bleeding: Secondary | ICD-10-CM | POA: Diagnosis not present

## 2021-01-22 DIAGNOSIS — J8410 Pulmonary fibrosis, unspecified: Secondary | ICD-10-CM | POA: Diagnosis not present

## 2021-01-22 DIAGNOSIS — D631 Anemia in chronic kidney disease: Secondary | ICD-10-CM | POA: Diagnosis not present

## 2021-01-22 DIAGNOSIS — M549 Dorsalgia, unspecified: Secondary | ICD-10-CM | POA: Diagnosis not present

## 2021-01-22 DIAGNOSIS — K219 Gastro-esophageal reflux disease without esophagitis: Secondary | ICD-10-CM | POA: Diagnosis not present

## 2021-01-22 DIAGNOSIS — E785 Hyperlipidemia, unspecified: Secondary | ICD-10-CM | POA: Diagnosis not present

## 2021-01-22 DIAGNOSIS — I129 Hypertensive chronic kidney disease with stage 1 through stage 4 chronic kidney disease, or unspecified chronic kidney disease: Secondary | ICD-10-CM | POA: Diagnosis not present

## 2021-01-22 DIAGNOSIS — F32A Depression, unspecified: Secondary | ICD-10-CM | POA: Diagnosis not present

## 2021-01-22 DIAGNOSIS — I7090 Unspecified atherosclerosis: Secondary | ICD-10-CM | POA: Diagnosis not present

## 2021-01-22 DIAGNOSIS — J9611 Chronic respiratory failure with hypoxia: Secondary | ICD-10-CM | POA: Diagnosis not present

## 2021-01-22 DIAGNOSIS — N1831 Chronic kidney disease, stage 3a: Secondary | ICD-10-CM | POA: Diagnosis not present

## 2021-01-22 DIAGNOSIS — I69398 Other sequelae of cerebral infarction: Secondary | ICD-10-CM | POA: Diagnosis not present

## 2021-01-22 NOTE — Telephone Encounter (Signed)
Rejection Reason - Patient did not respond - Unable to schedule appointment as patient has not responded to scheduling attempts. Please verify that patient does desire an appointment with endocrinology. If so, please place a new referral. Thanks!" Doyle Askew said on Jan 22, 2021 9:24 AM  [facility]

## 2021-01-23 ENCOUNTER — Ambulatory Visit: Payer: Medicare Other | Admitting: Family

## 2021-01-23 ENCOUNTER — Other Ambulatory Visit: Payer: Self-pay | Admitting: Family Medicine

## 2021-01-23 DIAGNOSIS — Z0289 Encounter for other administrative examinations: Secondary | ICD-10-CM

## 2021-01-26 ENCOUNTER — Ambulatory Visit: Payer: Medicare Other | Admitting: *Deleted

## 2021-01-26 ENCOUNTER — Ambulatory Visit (INDEPENDENT_AMBULATORY_CARE_PROVIDER_SITE_OTHER): Payer: Medicare Other

## 2021-01-26 ENCOUNTER — Telehealth: Payer: Self-pay | Admitting: Family

## 2021-01-26 DIAGNOSIS — I7090 Unspecified atherosclerosis: Secondary | ICD-10-CM | POA: Diagnosis not present

## 2021-01-26 DIAGNOSIS — I639 Cerebral infarction, unspecified: Secondary | ICD-10-CM | POA: Diagnosis not present

## 2021-01-26 DIAGNOSIS — I129 Hypertensive chronic kidney disease with stage 1 through stage 4 chronic kidney disease, or unspecified chronic kidney disease: Secondary | ICD-10-CM | POA: Diagnosis not present

## 2021-01-26 DIAGNOSIS — K219 Gastro-esophageal reflux disease without esophagitis: Secondary | ICD-10-CM | POA: Diagnosis not present

## 2021-01-26 DIAGNOSIS — J8410 Pulmonary fibrosis, unspecified: Secondary | ICD-10-CM | POA: Diagnosis not present

## 2021-01-26 DIAGNOSIS — M5137 Other intervertebral disc degeneration, lumbosacral region: Secondary | ICD-10-CM

## 2021-01-26 DIAGNOSIS — M1612 Unilateral primary osteoarthritis, left hip: Secondary | ICD-10-CM

## 2021-01-26 DIAGNOSIS — I69398 Other sequelae of cerebral infarction: Secondary | ICD-10-CM | POA: Diagnosis not present

## 2021-01-26 DIAGNOSIS — M549 Dorsalgia, unspecified: Secondary | ICD-10-CM | POA: Diagnosis not present

## 2021-01-26 DIAGNOSIS — R413 Other amnesia: Secondary | ICD-10-CM

## 2021-01-26 DIAGNOSIS — E785 Hyperlipidemia, unspecified: Secondary | ICD-10-CM | POA: Diagnosis not present

## 2021-01-26 DIAGNOSIS — N1831 Chronic kidney disease, stage 3a: Secondary | ICD-10-CM | POA: Diagnosis not present

## 2021-01-26 DIAGNOSIS — D631 Anemia in chronic kidney disease: Secondary | ICD-10-CM | POA: Diagnosis not present

## 2021-01-26 DIAGNOSIS — K5790 Diverticulosis of intestine, part unspecified, without perforation or abscess without bleeding: Secondary | ICD-10-CM | POA: Diagnosis not present

## 2021-01-26 DIAGNOSIS — F32A Depression, unspecified: Secondary | ICD-10-CM | POA: Diagnosis not present

## 2021-01-26 DIAGNOSIS — J9611 Chronic respiratory failure with hypoxia: Secondary | ICD-10-CM | POA: Diagnosis not present

## 2021-01-26 DIAGNOSIS — R42 Dizziness and giddiness: Secondary | ICD-10-CM | POA: Diagnosis not present

## 2021-01-26 LAB — CUP PACEART REMOTE DEVICE CHECK
Date Time Interrogation Session: 20220217235822
Implantable Pulse Generator Implant Date: 20180918

## 2021-01-26 NOTE — Patient Instructions (Signed)
Visit Information  PATIENT GOALS: Goals Addressed            This Visit's Progress   . Apply for Aid and Attendance Benefits   On track    Timeframe:  Long-Range Goal Priority:  Medium Start Date:  01/19/2021                           Expected End Date:  04/18/2021                      Follow Up Date 02/02/2021    . Continue to gather all of James's military documents required to submit with the application for Aid and Attendance Benefits, through [employer]. . Check your mailbox daily for a packet of resource information and application for Aid and Attendance Benefits, mailed to your home address by CSW on 01/19/2021.  Patient had not received packet, as of 01/26/2021. . Once received, review the following list of documents and consult with CSW if you have questions, or need assistance with application completion and/or submission:        How to Apply for Aid and Attendance Examination for Housebound Status or Permanent Need for Regular Aid and Attendance  Statement in Support of Claim Information and instructions to Help You Complete the Authorization to [place] to a Third Party Aid and Attendance Application  Why is this important?    Knowing how and where to find help for yourself or family in your neighborhood and community is an important skill.   You will want to take some steps to learn how.          The patient verbalized understanding of instructions, educational materials, and care plan provided today and declined offer to receive copy of patient instructions, educational materials, and care plan.   Telephone follow up appointment with care management team member scheduled for: 02/02/2021 at [place] Licensed Clinical Social Worker [place] [phone]

## 2021-01-26 NOTE — Telephone Encounter (Signed)
Call pt  I received a nice note from PT [facility] in regards to medication and perhaps not taking blood pressure medications and general confusion in regards to medication.  Would pt be interested in pre package medication from [facility] pharmacy ( [facility])? [facility] can [facility] and see if they arrange.  Another option is pill box? Can she purchase one so we can ensure safe , compliant?

## 2021-01-26 NOTE — Chronic Care Management (AMB) (Addendum)
Chronic Care Management    Clinical Social Work Note  01/26/2021 Name: Melody Ochoa MRN: [id number] DOB: 1941/02/19  Melody Ochoa is a 80 y.o. year old female who is a primary care patient of Burnard Hawthorne, FNP. The CCM team was consulted to assist the patient with chronic disease management and/or care coordination needs related to: Level of Care Concerns.   Engaged with patient by telephone for follow up visit in response to provider referral for social work chronic care management and care coordination services.   Consent to Services:   Patient agreed to services and consent obtained.   Assessment: Review of patient past medical history, allergies, medications, and health status, including review of relevant consultants reports was performed today as part of a comprehensive evaluation and provision of chronic care management and care coordination services.     SDOH (Social Determinants of Health) assessments and interventions performed:  Assess need for in-home care and respite services.  Advanced Directives Status: Not addressed in this encounter.  CCM Care Plan  Allergies  Allergen Reactions  . Latex Itching  . Aleve [Naproxen Sodium] Other (See Comments)    Gi upset  . Aspirin Other (See Comments)    Stomach pain-aggravates diverticulosis  . Celebrex [Celecoxib] Other (See Comments)    Dizziness   . Effexor [Venlafaxine] Other (See Comments)    Hot flashes   . Gabapentin Other (See Comments)    "Night terrors"  . Hydrocodone-Homatropine Diarrhea  . Ibuprofen Other (See Comments)    Gi upset   . Mobic [Meloxicam]     Stomach upset  . Tape Itching and Other (See Comments)    Itchy blisters  . Vioxx [Rofecoxib] Other (See Comments)    Gi upset  . Other Rash and Other (See Comments)    bandaides  . Tramadol Other (See Comments)    hallucinations    Outpatient Encounter Medications as of 01/26/2021  Medication Sig  . albuterol (VENTOLIN HFA) 108 (90  Base) MCG/ACT inhaler TAKE 2 PUFFS BY MOUTH EVERY 6 HOURS AS NEEDED FOR WHEEZE OR SHORTNESS OF BREATH (Patient taking differently: Inhale 2 puffs into the lungs every 6 (six) hours as needed for wheezing or shortness of breath.)  . amLODipine (NORVASC) 5 MG tablet TAKE 1 TABLET (5 MG TOTAL) BY MOUTH DAILY.  Marland Kitchen ASPIRIN LOW DOSE 81 MG EC tablet TAKE 1 TABLET (81 MG TOTAL) BY MOUTH DAILY FOR 21 DAYS. SWALLOW WHOLE.  Marland Kitchen atorvastatin (LIPITOR) 80 MG tablet Take 1 tablet (80 mg total) by mouth daily.  Marland Kitchen buPROPion (WELLBUTRIN XL) 300 MG 24 hr tablet Take 300 mg by mouth daily.   . clopidogrel (PLAVIX) 75 MG tablet TAKE 1 TABLET BY MOUTH EVERY DAY (Patient taking differently: Take 75 mg by mouth daily.)  . DULoxetine (CYMBALTA) 30 MG capsule Take 30 mg by mouth daily.  . famotidine (PEPCID) 20 MG tablet Take 20 mg by mouth at bedtime.   . fluticasone (FLONASE) 50 MCG/ACT nasal spray SPRAY 2 SPRAYS INTO EACH NOSTRIL EVERY DAY (Patient taking differently: Place 2 sprays into both nostrils daily as needed for allergies.)  . pregabalin (LYRICA) 50 MG capsule 50 mg during day, 100 mg at night (Patient taking differently: Take 50-100 mg by mouth See admin instructions. Take 1 capsule (50mg ) by mouth every morning and take 2 capsules (100mg ) by mouth at night)   No facility-administered encounter medications on file as of 01/26/2021.    Patient Active Problem List   Diagnosis Date  Noted  . Adenoma of left adrenal gland 12/25/2020  . Ischemic stroke ([place]) 12/10/2020  . Chronic kidney disease, stage 3a ([place]) 12/09/2020  . Chronic respiratory failure with hypoxia ([place]) 12/09/2020  . Fall at home, initial encounter 12/09/2020  . Right shoulder pain 12/09/2020  . Memory deficit 09/24/2020  . Pulmonary fibrosis ([place]) 09/14/2020  . Chronic cough 09/14/2020  . Arthritis of left sacroiliac joint 08/26/2020  . Chronic left SI joint pain 08/26/2020  . CKD (chronic kidney disease) 07/23/2020  . HTN (hypertension)  04/23/2020  . Positive fecal immunochemical test   . Angiodysplasia of intestinal tract   . Transient ischemic attack (TIA) 10/08/2019  . Transient right leg weakness 09/30/2019  . Peripheral neuropathy 08/10/2019  . Primary osteoarthritis of left shoulder 07/24/2019  . Elevated blood pressure reading 06/27/2019  . Left shoulder pain 05/04/2019  . Anemia 03/19/2019  . Long term current use of opiate analgesic 01/09/2019  . Foraminal stenosis of lumbar region 11/14/2018  . Chronic pain syndrome 09/12/2018  . SOB (shortness of breath) on exertion 08/11/2018  . Dysphagia 08/11/2018  . Cough 06/30/2018  . Rash 01/16/2018  . Hyperlipidemia 10/18/2017  . History of lumbar surgery 10/04/2017  . Depression, recurrent ([place]) 09/27/2017  . Pulmonary nodules 09/27/2017  . History of CVA (cerebrovascular accident) 09/12/2017  . Cerebellar stroke, acute ([place]) 08/10/2017  . Primary osteoarthritis of left hip 02/28/2017  . Strain of left hip 02/28/2017  . B12 deficiency 12/21/2016  . Generalized OA 05/13/2016  . Status post total replacement of right hip 04/22/2016  . Personal history of colonic polyps   . Greater trochanteric bursitis of both hips 01/09/2016  . Prediabetes 08/04/2015  . Thoracic aortic atherosclerosis ([place]) 06/16/2015  . 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  . UTI (lower urinary tract infection) 11/22/2014  . Low back pain 11/22/2014  . Spondylolisthesis of lumbar region 11/18/2014  . Lumbar radiculopathy 08/21/2014  . Degeneration of lumbar or lumbosacral intervertebral disc 08/21/2014  . Left hip pain 08/21/2014  . Osteopenia 06/23/2014  . Major depression in remission ([place]) 06/23/2014  . Anxiety 06/23/2014  . Menopause 06/23/2014    Conditions to be addressed/monitored: Ischemic Stroke, Memory Deficits, Degeneration of Lumbar Disc, Primary Osteoarthritis of Left Hip.   Care Plan : LCSW Plan of Care  Updates made by Francis Gaines, LCSW since 01/26/2021  12:00 AM    Problem: Fall Risk     Long-Range Goal: Improve Quality of Life through [facility] and Respite Services.   Start Date: 01/19/2021  Expected End Date: 04/20/2021  Recent Progress: On track  Priority: High  Note:   Current barriers:    Unable to perform activities of daily living independently - needs additional assistance, support, and resources in order to meet this unmet need.  Frequent falls - continue to receive home health physical therapy to provide strengthening, mobility, conditioning, and safety techniques to reduce fall risk.    Knowledge deficits related to available community agencies and resources - information and application provided for Aid and Attendance Benefits through [employer].   Clinical Goals: Over the next 90 days, patient will demonstrate improved health management independence as evidenced by having in-home care services in place to assist with activities of daily living and reduced risk for falls. Interventions : . Assessed needs, level of care concerns, basic eligibility and provided education on Aid and Attendance Benefits through [employer]. . Reviewed community support options (skilled nursing placement, long-term care assisted living placement, and in-home care/respite  services).  . Provided information and applications for Aid and Attendance Benefits through [employer]. Nash Dimmer with appropriate clinical care team members regarding patient needs.   Discussed plans with patient for ongoing care management follow up and provided patient with direct       contact information for care management team.  Advised patient to gather all necessary paperwork and documentation necessary to submit with application for Aid and Attendance Benefits through [employer]. . Other interventions provided: problem solving and resource sharing. Marland Kitchen Collaboration with PCP regarding development and update of  comprehensive plan of care as evidenced by provider attestation and co-signature.   Encourage physical activity, such as performance of self-care at highest level of ability, strength and balance exercise program, and provision of appropriate assistive devices: continue home health physical therapy and utilize walker when ambulating without assistance.  Patient Goals/Self-Care Activities:  . Continue to gather all of James's military documents required to submit with the application for Aid and Attendance Benefits, through [employer]. . Check your mailbox daily for a packet of resource information and application for Aid and Attendance Benefits, mailed to your home address by CSW on 01/19/2021.  Patient had not received packet, as of 01/26/2021. . Once received, review the following list of documents and consult with CSW if you have questions, or need assistance with application completion and/or submission:        How to Apply for Aid and Attendance Examination for Housebound Status or Permanent Need for Regular Aid and Attendance  Statement in Support of Claim Information and instructions to Help You Complete the Authorization to [place] to a Third Party Aid and Attendance Application Why is this important?    Knowing how and where to find help for yourself or family in your neighborhood and community is an important skill.   You will want to take some steps to learn how.          Follow Up Plan: CSW will follow up with patient by phone on Monday, February 02, 2021, at [place] LCSW Licensed Clinical Social Worker [place] [phone]  Agree with plan. [name], NP

## 2021-01-27 DIAGNOSIS — R42 Dizziness and giddiness: Secondary | ICD-10-CM | POA: Diagnosis not present

## 2021-01-27 DIAGNOSIS — J9611 Chronic respiratory failure with hypoxia: Secondary | ICD-10-CM | POA: Diagnosis not present

## 2021-01-27 DIAGNOSIS — E785 Hyperlipidemia, unspecified: Secondary | ICD-10-CM | POA: Diagnosis not present

## 2021-01-27 DIAGNOSIS — D631 Anemia in chronic kidney disease: Secondary | ICD-10-CM | POA: Diagnosis not present

## 2021-01-27 DIAGNOSIS — K219 Gastro-esophageal reflux disease without esophagitis: Secondary | ICD-10-CM | POA: Diagnosis not present

## 2021-01-27 DIAGNOSIS — I7090 Unspecified atherosclerosis: Secondary | ICD-10-CM | POA: Diagnosis not present

## 2021-01-27 DIAGNOSIS — N1831 Chronic kidney disease, stage 3a: Secondary | ICD-10-CM | POA: Diagnosis not present

## 2021-01-27 DIAGNOSIS — J8410 Pulmonary fibrosis, unspecified: Secondary | ICD-10-CM | POA: Diagnosis not present

## 2021-01-27 DIAGNOSIS — F32A Depression, unspecified: Secondary | ICD-10-CM | POA: Diagnosis not present

## 2021-01-27 DIAGNOSIS — M549 Dorsalgia, unspecified: Secondary | ICD-10-CM | POA: Diagnosis not present

## 2021-01-27 DIAGNOSIS — I69398 Other sequelae of cerebral infarction: Secondary | ICD-10-CM | POA: Diagnosis not present

## 2021-01-27 DIAGNOSIS — I129 Hypertensive chronic kidney disease with stage 1 through stage 4 chronic kidney disease, or unspecified chronic kidney disease: Secondary | ICD-10-CM | POA: Diagnosis not present

## 2021-01-27 DIAGNOSIS — K5790 Diverticulosis of intestine, part unspecified, without perforation or abscess without bleeding: Secondary | ICD-10-CM | POA: Diagnosis not present

## 2021-01-28 NOTE — Telephone Encounter (Signed)
Spoken to patient. She stated she is having a nurse set your medication up. Also she stated she just didn't pick up the bp medication until five days later she was not confused at all.

## 2021-01-29 DIAGNOSIS — J8410 Pulmonary fibrosis, unspecified: Secondary | ICD-10-CM | POA: Diagnosis not present

## 2021-01-29 DIAGNOSIS — K5790 Diverticulosis of intestine, part unspecified, without perforation or abscess without bleeding: Secondary | ICD-10-CM | POA: Diagnosis not present

## 2021-01-29 DIAGNOSIS — M549 Dorsalgia, unspecified: Secondary | ICD-10-CM | POA: Diagnosis not present

## 2021-01-29 DIAGNOSIS — F32A Depression, unspecified: Secondary | ICD-10-CM | POA: Diagnosis not present

## 2021-01-29 DIAGNOSIS — D631 Anemia in chronic kidney disease: Secondary | ICD-10-CM | POA: Diagnosis not present

## 2021-01-29 DIAGNOSIS — I129 Hypertensive chronic kidney disease with stage 1 through stage 4 chronic kidney disease, or unspecified chronic kidney disease: Secondary | ICD-10-CM | POA: Diagnosis not present

## 2021-01-29 DIAGNOSIS — I7090 Unspecified atherosclerosis: Secondary | ICD-10-CM | POA: Diagnosis not present

## 2021-01-29 DIAGNOSIS — J9611 Chronic respiratory failure with hypoxia: Secondary | ICD-10-CM | POA: Diagnosis not present

## 2021-01-29 DIAGNOSIS — E785 Hyperlipidemia, unspecified: Secondary | ICD-10-CM | POA: Diagnosis not present

## 2021-01-29 DIAGNOSIS — N1831 Chronic kidney disease, stage 3a: Secondary | ICD-10-CM | POA: Diagnosis not present

## 2021-01-29 DIAGNOSIS — I69398 Other sequelae of cerebral infarction: Secondary | ICD-10-CM | POA: Diagnosis not present

## 2021-01-29 DIAGNOSIS — R42 Dizziness and giddiness: Secondary | ICD-10-CM | POA: Diagnosis not present

## 2021-01-29 DIAGNOSIS — K219 Gastro-esophageal reflux disease without esophagitis: Secondary | ICD-10-CM | POA: Diagnosis not present

## 2021-01-31 ENCOUNTER — Other Ambulatory Visit: Payer: Self-pay | Admitting: Family Medicine

## 2021-02-02 ENCOUNTER — Ambulatory Visit: Payer: Medicare Other | Admitting: *Deleted

## 2021-02-02 DIAGNOSIS — Z96641 Presence of right artificial hip joint: Secondary | ICD-10-CM

## 2021-02-02 DIAGNOSIS — R296 Repeated falls: Secondary | ICD-10-CM

## 2021-02-02 DIAGNOSIS — I639 Cerebral infarction, unspecified: Secondary | ICD-10-CM

## 2021-02-02 DIAGNOSIS — G894 Chronic pain syndrome: Secondary | ICD-10-CM

## 2021-02-02 DIAGNOSIS — M5137 Other intervertebral disc degeneration, lumbosacral region: Secondary | ICD-10-CM

## 2021-02-02 NOTE — Patient Instructions (Signed)
   PATIENT GOALS: Goals Addressed            This Visit's Progress   . Apply for Aid and Attendance Benefits       Timeframe:  Long-Range Goal Priority:  Medium Start Date:  01/19/2021                           Expected End Date:  04/18/2021                      Follow Up Date 02/10/2021 at 11:00am.   . Continue to gather all of James's military documents required to submit with the application for Aid and Attendance Benefits, through [employer]. . Review contents of packet and be prepared to complete the following documents with CSW during our next scheduled phone conversation:          How to Apply for Aid and Attendance Examination for Housebound Status or Permanent Need for Regular Aid and Attendance  Statement in Support of Claim Information and instructions to Help You Complete the Authorization to [place] to a Third Party Aid and Attendance Application . Continue to work with home health physical therapist on conditioning and safety techniques, as well as to regain strength and mobility, in order to perform all activities of daily living independently.  Why is this important?    Knowing how and where to find help for yourself or family in your neighborhood and community is an important skill.   You will want to take some steps to learn how.          Patient verbalizes understanding of instructions provided today and agrees to view in [place].   Telephone follow up appointment with care management team member scheduled for: 02/10/2021 at 11:00am.  Nat Christen LCSW Licensed Clinical Social Worker [place]  [phone]

## 2021-02-02 NOTE — Chronic Care Management (AMB) (Addendum)
Chronic Care Management    Clinical Social Work Note  02/02/2021 Name: SANDRA BRENTS MRN: [id number] DOB: 02-06-1941  Melody Ochoa is a 80 y.o. year old female who is a primary care patient of Burnard Hawthorne, FNP. The CCM team was consulted to assist the patient with chronic disease management and/or care coordination needs related to: Level of Care Concerns.   Engaged with patient by telephone for follow up visit in response to provider referral for social work chronic care management and care coordination services.   Consent to Services:   Patient agreed to services and consent obtained.   Assessment: Review of patient past medical history, allergies, medications, and health status, including review of relevant consultants reports was performed today as part of a comprehensive evaluation and provision of chronic care management and care coordination services.     SDOH (Social Determinants of Health) assessments and interventions performed:    Advanced Directives Status: Not addressed in this encounter.  CCM Care Plan  Allergies  Allergen Reactions  . Latex Itching  . Aleve [Naproxen Sodium] Other (See Comments)    Gi upset  . Aspirin Other (See Comments)    Stomach pain-aggravates diverticulosis  . Celebrex [Celecoxib] Other (See Comments)    Dizziness   . Effexor [Venlafaxine] Other (See Comments)    Hot flashes   . Gabapentin Other (See Comments)    "Night terrors"  . Hydrocodone-Homatropine Diarrhea  . Ibuprofen Other (See Comments)    Gi upset   . Mobic [Meloxicam]     Stomach upset  . Tape Itching and Other (See Comments)    Itchy blisters  . Vioxx [Rofecoxib] Other (See Comments)    Gi upset  . Other Rash and Other (See Comments)    bandaides  . Tramadol Other (See Comments)    hallucinations    Outpatient Encounter Medications as of 02/02/2021  Medication Sig  . albuterol (VENTOLIN HFA) 108 (90 Base) MCG/ACT inhaler TAKE 2 PUFFS BY MOUTH EVERY  6 HOURS AS NEEDED FOR WHEEZE OR SHORTNESS OF BREATH (Patient taking differently: Inhale 2 puffs into the lungs every 6 (six) hours as needed for wheezing or shortness of breath.)  . amLODipine (NORVASC) 5 MG tablet TAKE 1 TABLET (5 MG TOTAL) BY MOUTH DAILY.  Marland Kitchen ASPIRIN LOW DOSE 81 MG EC tablet TAKE 1 TABLET (81 MG TOTAL) BY MOUTH DAILY FOR 21 DAYS. SWALLOW WHOLE.  Marland Kitchen atorvastatin (LIPITOR) 80 MG tablet Take 1 tablet (80 mg total) by mouth daily.  Marland Kitchen buPROPion (WELLBUTRIN XL) 300 MG 24 hr tablet Take 300 mg by mouth daily.   . clopidogrel (PLAVIX) 75 MG tablet TAKE 1 TABLET BY MOUTH EVERY DAY (Patient taking differently: Take 75 mg by mouth daily.)  . DULoxetine (CYMBALTA) 30 MG capsule Take 30 mg by mouth daily.  . famotidine (PEPCID) 20 MG tablet Take 20 mg by mouth at bedtime.   . fluticasone (FLONASE) 50 MCG/ACT nasal spray SPRAY 2 SPRAYS INTO EACH NOSTRIL EVERY DAY (Patient taking differently: Place 2 sprays into both nostrils daily as needed for allergies.)  . pregabalin (LYRICA) 50 MG capsule 50 mg during day, 100 mg at night (Patient taking differently: Take 50-100 mg by mouth See admin instructions. Take 1 capsule (50mg ) by mouth every morning and take 2 capsules (100mg ) by mouth at night)   No facility-administered encounter medications on file as of 02/02/2021.    Patient Active Problem List   Diagnosis Date Noted  . Adenoma of left adrenal  gland 12/25/2020  . Ischemic stroke ([place]) 12/10/2020  . Chronic kidney disease, stage 3a ([place]) 12/09/2020  . Chronic respiratory failure with hypoxia ([place]) 12/09/2020  . Fall at home, initial encounter 12/09/2020  . Right shoulder pain 12/09/2020  . Memory deficit 09/24/2020  . Pulmonary fibrosis ([place]) 09/14/2020  . Chronic cough 09/14/2020  . Arthritis of left sacroiliac joint 08/26/2020  . Chronic left SI joint pain 08/26/2020  . CKD (chronic kidney disease) 07/23/2020  . HTN (hypertension) 04/23/2020  . Positive fecal immunochemical test   .  Angiodysplasia of intestinal tract   . Transient ischemic attack (TIA) 10/08/2019  . Transient right leg weakness 09/30/2019  . Peripheral neuropathy 08/10/2019  . Primary osteoarthritis of left shoulder 07/24/2019  . Elevated blood pressure reading 06/27/2019  . Left shoulder pain 05/04/2019  . Anemia 03/19/2019  . Long term current use of opiate analgesic 01/09/2019  . Foraminal stenosis of lumbar region 11/14/2018  . Chronic pain syndrome 09/12/2018  . SOB (shortness of breath) on exertion 08/11/2018  . Dysphagia 08/11/2018  . Cough 06/30/2018  . Rash 01/16/2018  . Hyperlipidemia 10/18/2017  . History of lumbar surgery 10/04/2017  . Depression, recurrent ([place]) 09/27/2017  . Pulmonary nodules 09/27/2017  . History of CVA (cerebrovascular accident) 09/12/2017  . Cerebellar stroke, acute ([place]) 08/10/2017  . Primary osteoarthritis of left hip 02/28/2017  . Strain of left hip 02/28/2017  . B12 deficiency 12/21/2016  . Generalized OA 05/13/2016  . Status post total replacement of right hip 04/22/2016  . Personal history of colonic polyps   . Greater trochanteric bursitis of both hips 01/09/2016  . Prediabetes 08/04/2015  . Thoracic aortic atherosclerosis ([place]) 06/16/2015  . 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  . UTI (lower urinary tract infection) 11/22/2014  . Low back pain 11/22/2014  . Spondylolisthesis of lumbar region 11/18/2014  . Lumbar radiculopathy 08/21/2014  . Degeneration of lumbar or lumbosacral intervertebral disc 08/21/2014  . Left hip pain 08/21/2014  . Osteopenia 06/23/2014  . Major depression in remission ([place]) 06/23/2014  . Anxiety 06/23/2014  . Menopause 06/23/2014    Conditions to be addressed/monitored: HTN and Ischemic Stroke, Frequent Falls, Chronic Pain; Level of care concerns and Inability to perform ADL's independently.  Care Plan : LCSW Plan of Care  Updates made by Francis Gaines, LCSW since 02/02/2021 12:00 AM    Problem: Requires  Assistance with Activities of Daily Living Due to Fall Risk   Priority: High    Long-Range Goal: Improve Quality of Life through [place].   Start Date: 01/19/2021  Expected End Date: 04/20/2021  Recent Progress: On track  Priority: High  Note:   Current barriers:    Unable to perform activities of daily living independently - needs additional assistance, support, and resources in order to meet this unmet need.  Frequent falls - continue to receive home health physical therapy to provide strengthening, mobility, conditioning, and safety techniques to reduce fall risk.    Knowledge deficits related to available community agencies and resources - information and application provided for Aid and Attendance Benefits through [employer].    Clinical Goals: Over the next 90 days, patient will demonstrate improved health management independence as evidenced by having in-home care services in place to assist with activities of daily living and reduced risk for falls.  Interventions : . Assessed needs, level of care concerns, basic eligibility and provided education on Aid and Attendance Benefits through [employer]. . Reviewed community support options (  skilled nursing placement, long-term care assisted living placement, and in-home care/respite services).  . Provided information and applications for Aid and Attendance Benefits through [employer]. Nash Dimmer with appropriate clinical care team members regarding patient needs.   Discussed plans with patient for ongoing care management follow up and provided patient with direct       contact information for care management team.  Advised patient to gather all necessary paperwork and documentation necessary to submit with application for Aid and Attendance Benefits through [employer]. . Other interventions provided: problem solving and resource sharing. Marland Kitchen Collaboration  with PCP regarding development and update of comprehensive plan of care as evidenced by provider attestation and co-signature.   Encourage physical activity, such as performance of self-care at highest level of ability, strength and balance exercise program, and provision of appropriate assistive devices: continue home health physical therapy and utilize walker when ambulating without assistance.   Patient Goals/Self-Care Activities:  . Continue to gather all of James's military documents required to submit with the application for Aid and Attendance Benefits, through [employer]. . Review contents of packet and be prepared to complete the following documents with CSW during our next scheduled phone conversation:          How to Apply for Aid and Attendance Examination for Housebound Status or Permanent Need for Regular Aid and Attendance  Statement in Support of Claim Information and instructions to Help You Complete the Authorization to [place] to a Third Party Aid and Attendance Application . Continue to work with home health physical therapist on conditioning and safety techniques, as well as to regain strength and mobility, in order to perform all activities of daily living independently.  Why is this important?    Knowing how and where to find help for yourself or family in your neighborhood and community is an important skill.   You will want to take some steps to learn how.      Problem: Fall Risk        Follow Up Plan: CSW will follow up with patient by phone on 02/10/2021 at 11:00am.      Nat Christen LCSW Licensed Clinical Social Worker [place]  [phone]  Agree with plan. [name], NP

## 2021-02-03 DIAGNOSIS — M13852 Other specified arthritis, left hip: Secondary | ICD-10-CM | POA: Diagnosis not present

## 2021-02-03 NOTE — Progress Notes (Signed)
Carelink Summary Report / Loop Recorder 

## 2021-02-04 DIAGNOSIS — I7090 Unspecified atherosclerosis: Secondary | ICD-10-CM | POA: Diagnosis not present

## 2021-02-04 DIAGNOSIS — E785 Hyperlipidemia, unspecified: Secondary | ICD-10-CM | POA: Diagnosis not present

## 2021-02-04 DIAGNOSIS — K219 Gastro-esophageal reflux disease without esophagitis: Secondary | ICD-10-CM | POA: Diagnosis not present

## 2021-02-04 DIAGNOSIS — D631 Anemia in chronic kidney disease: Secondary | ICD-10-CM | POA: Diagnosis not present

## 2021-02-04 DIAGNOSIS — I69398 Other sequelae of cerebral infarction: Secondary | ICD-10-CM | POA: Diagnosis not present

## 2021-02-04 DIAGNOSIS — N1831 Chronic kidney disease, stage 3a: Secondary | ICD-10-CM | POA: Diagnosis not present

## 2021-02-04 DIAGNOSIS — F32A Depression, unspecified: Secondary | ICD-10-CM | POA: Diagnosis not present

## 2021-02-04 DIAGNOSIS — M549 Dorsalgia, unspecified: Secondary | ICD-10-CM | POA: Diagnosis not present

## 2021-02-04 DIAGNOSIS — J8410 Pulmonary fibrosis, unspecified: Secondary | ICD-10-CM | POA: Diagnosis not present

## 2021-02-04 DIAGNOSIS — R42 Dizziness and giddiness: Secondary | ICD-10-CM | POA: Diagnosis not present

## 2021-02-04 DIAGNOSIS — J9611 Chronic respiratory failure with hypoxia: Secondary | ICD-10-CM | POA: Diagnosis not present

## 2021-02-04 DIAGNOSIS — I129 Hypertensive chronic kidney disease with stage 1 through stage 4 chronic kidney disease, or unspecified chronic kidney disease: Secondary | ICD-10-CM | POA: Diagnosis not present

## 2021-02-04 DIAGNOSIS — K5790 Diverticulosis of intestine, part unspecified, without perforation or abscess without bleeding: Secondary | ICD-10-CM | POA: Diagnosis not present

## 2021-02-06 ENCOUNTER — Telehealth: Payer: Self-pay | Admitting: Family

## 2021-02-06 DIAGNOSIS — J8410 Pulmonary fibrosis, unspecified: Secondary | ICD-10-CM

## 2021-02-06 DIAGNOSIS — J9801 Acute bronchospasm: Secondary | ICD-10-CM

## 2021-02-06 DIAGNOSIS — R059 Cough, unspecified: Secondary | ICD-10-CM

## 2021-02-06 MED ORDER — ALBUTEROL SULFATE HFA 108 (90 BASE) MCG/ACT IN AERS: INHALATION_SPRAY | RESPIRATORY_TRACT | 2 refills | Status: DC

## 2021-02-06 MED ORDER — CLOPIDOGREL BISULFATE 75 MG PO TABS: 75.0000 mg | ORAL_TABLET | Freq: Every day | ORAL | 3 refills | Status: DC

## 2021-02-06 MED ORDER — ATORVASTATIN CALCIUM 80 MG PO TABS: 80.0000 mg | ORAL_TABLET | Freq: Every day | ORAL | 0 refills | Status: DC

## 2021-02-06 MED ORDER — FLUTICASONE PROPIONATE 50 MCG/ACT NA SUSP: NASAL | 1 refills | Status: DC

## 2021-02-06 MED ORDER — DULOXETINE HCL 30 MG PO CPEP: 30.0000 mg | ORAL_CAPSULE | Freq: Every day | ORAL | 1 refills | Status: DC

## 2021-02-06 MED ORDER — FAMOTIDINE 20 MG PO TABS: 20.0000 mg | ORAL_TABLET | Freq: Every day | ORAL | 1 refills | Status: DC

## 2021-02-06 MED ORDER — AMLODIPINE BESYLATE 5 MG PO TABS: 5.0000 mg | ORAL_TABLET | Freq: Every day | ORAL | 0 refills | Status: DC

## 2021-02-06 MED ORDER — BUPROPION HCL ER (XL) 300 MG PO TB24: 300.0000 mg | ORAL_TABLET | Freq: Every day | ORAL | 1 refills | Status: DC

## 2021-02-06 NOTE — Telephone Encounter (Signed)
Call pt lyrica is controlled sub and has been being refilled by dr Holley Raring; she will need to continue filling with him

## 2021-02-06 NOTE — Telephone Encounter (Signed)
Pt called and informed to contact Dr. Holley Raring for Lyrica refill. She said that [place] nurse was the one who called & was trying to get her meds refilled for pill packs. She said that she just didn't know that wasn't filled here. Pt knows to contact Dr. Holley Raring if & when refill is needed.

## 2021-02-06 NOTE — Telephone Encounter (Signed)
Patient wants all her medication move to [facility] albuterol (VENTOLIN HFA) 108 (90 Base) MCG/ACT inhaler  amLODipine (NORVASC) 5 MG tablet  ASPIRIN LOW DOSE 81 MG EC tablet  atorvastatin (LIPITOR) 80 MG tablet  buPROPion (WELLBUTRIN XL) 300 MG 24 hr tablet  clopidogrel (PLAVIX) 75 MG tablet  DULoxetine (CYMBALTA) 30 MG capsule  famotidine (PEPCID) 20 MG tablet  fluticasone (FLONASE) 50 MCG/ACT nasal spray  pregabalin (LYRICA) 50 MG capsule

## 2021-02-06 NOTE — Telephone Encounter (Signed)
All medication but lyrica have been refilled to [facility].

## 2021-02-08 ENCOUNTER — Other Ambulatory Visit: Payer: Self-pay | Admitting: Family Medicine

## 2021-02-10 ENCOUNTER — Ambulatory Visit (INDEPENDENT_AMBULATORY_CARE_PROVIDER_SITE_OTHER): Payer: Medicare Other | Admitting: *Deleted

## 2021-02-10 DIAGNOSIS — J8410 Pulmonary fibrosis, unspecified: Secondary | ICD-10-CM | POA: Diagnosis not present

## 2021-02-10 DIAGNOSIS — M1612 Unilateral primary osteoarthritis, left hip: Secondary | ICD-10-CM | POA: Diagnosis not present

## 2021-02-10 DIAGNOSIS — Z96641 Presence of right artificial hip joint: Secondary | ICD-10-CM

## 2021-02-10 DIAGNOSIS — M5137 Other intervertebral disc degeneration, lumbosacral region: Secondary | ICD-10-CM

## 2021-02-10 DIAGNOSIS — G894 Chronic pain syndrome: Secondary | ICD-10-CM

## 2021-02-10 NOTE — Chronic Care Management (AMB) (Addendum)
Chronic Care Management    Clinical Social Work Note  02/10/2021 Name: Melody Ochoa MRN: [id number] DOB: [birth year]  Melody Ochoa is a 80 y.o. year old female who is a primary care patient of Burnard Hawthorne, FNP. The CCM team was consulted to assist the patient with chronic disease management and/or care coordination needs related to: community resources, level of care concerns, and in-home care services through Aid and Attendance Benefits with [employer]   Engaged with patient by telephone for follow up visit in response to provider referral for social work chronic care management and care coordination services.   Consent to Services:  The patient was given information about Chronic Care Management services, agreed to services, and gave verbal consent prior to initiation of services.  Please see initial visit note for detailed documentation.   Patient agreed to services and consent obtained.   Assessment: Review of patient past medical history, allergies, medications, and health status, including review of relevant consultants reports was performed today as part of a comprehensive evaluation and provision of chronic care management and care coordination services.     SDOH (Social Determinants of Health) assessments and interventions performed:    Advanced Directives Status: Not addressed in this encounter.  CCM Care Plan  Allergies  Allergen Reactions  . Latex Itching  . Aleve [Naproxen Sodium] Other (See Comments)    Gi upset  . Aspirin Other (See Comments)    Stomach pain-aggravates diverticulosis  . Celebrex [Celecoxib] Other (See Comments)    Dizziness   . Effexor [Venlafaxine] Other (See Comments)    Hot flashes   . Gabapentin Other (See Comments)    "Night terrors"  . Hydrocodone-Homatropine Diarrhea  . Ibuprofen Other (See Comments)    Gi upset   . Mobic [Meloxicam]     Stomach upset  . Tape Itching and Other (See Comments)    Itchy  blisters  . Vioxx [Rofecoxib] Other (See Comments)    Gi upset  . Other Rash and Other (See Comments)    bandaides  . Tramadol Other (See Comments)    hallucinations    Outpatient Encounter Medications as of 02/10/2021  Medication Sig  . albuterol (VENTOLIN HFA) 108 (90 Base) MCG/ACT inhaler TAKE 2 PUFFS BY MOUTH EVERY 6 HOURS AS NEEDED FOR WHEEZE OR SHORTNESS OF BREATH  . amLODipine (NORVASC) 5 MG tablet Take 1 tablet (5 mg total) by mouth daily.  Marland Kitchen aspirin 81 MG EC tablet TAKE 1 TABLET (81 MG TOTAL) BY MOUTH DAILY FOR 21 DAYS. SWALLOW WHOLE.  Marland Kitchen atorvastatin (LIPITOR) 80 MG tablet Take 1 tablet (80 mg total) by mouth daily.  Marland Kitchen buPROPion (WELLBUTRIN XL) 300 MG 24 hr tablet Take 1 tablet (300 mg total) by mouth daily.  . clopidogrel (PLAVIX) 75 MG tablet Take 1 tablet (75 mg total) by mouth daily.  . DULoxetine (CYMBALTA) 30 MG capsule Take 1 capsule (30 mg total) by mouth daily.  . famotidine (PEPCID) 20 MG tablet Take 1 tablet (20 mg total) by mouth at bedtime.  . fluticasone (FLONASE) 50 MCG/ACT nasal spray SPRAY 2 SPRAYS INTO EACH NOSTRIL EVERY DAY  . pregabalin (LYRICA) 50 MG capsule 50 mg during day, 100 mg at night (Patient taking differently: Take 50-100 mg by mouth See admin instructions. Take 1 capsule (50mg ) by mouth every morning and take 2 capsules (100mg ) by mouth at night)   No facility-administered encounter medications on file as of 02/10/2021.    Patient Active Problem List  Diagnosis Date Noted  . Adenoma of left adrenal gland 12/25/2020  . Ischemic stroke ([place]) 12/10/2020  . Chronic kidney disease, stage 3a ([place]) 12/09/2020  . Chronic respiratory failure with hypoxia ([place]) 12/09/2020  . Fall at home, initial encounter 12/09/2020  . Right shoulder pain 12/09/2020  . Memory deficit 09/24/2020  . Pulmonary fibrosis ([place]) 09/14/2020  . Chronic cough 09/14/2020  . Arthritis of left sacroiliac joint 08/26/2020  . Chronic left SI joint pain 08/26/2020  . CKD (chronic  kidney disease) 07/23/2020  . HTN (hypertension) 04/23/2020  . Positive fecal immunochemical test   . Angiodysplasia of intestinal tract   . Transient ischemic attack (TIA) 10/08/2019  . Transient right leg weakness 09/30/2019  . Peripheral neuropathy 08/10/2019  . Primary osteoarthritis of left shoulder 07/24/2019  . Elevated blood pressure reading 06/27/2019  . Left shoulder pain 05/04/2019  . Anemia 03/19/2019  . Long term current use of opiate analgesic 01/09/2019  . Foraminal stenosis of lumbar region 11/14/2018  . Chronic pain syndrome 09/12/2018  . SOB (shortness of breath) on exertion 08/11/2018  . Dysphagia 08/11/2018  . Cough 06/30/2018  . Rash 01/16/2018  . Hyperlipidemia 10/18/2017  . History of lumbar surgery 10/04/2017  . Depression, recurrent ([place]) 09/27/2017  . Pulmonary nodules 09/27/2017  . History of CVA (cerebrovascular accident) 09/12/2017  . Cerebellar stroke, acute ([place]) 08/10/2017  . Primary osteoarthritis of left hip 02/28/2017  . Strain of left hip 02/28/2017  . B12 deficiency 12/21/2016  . Generalized OA 05/13/2016  . Status post total replacement of right hip 04/22/2016  . Personal history of colonic polyps   . Greater trochanteric bursitis of both hips 01/09/2016  . Prediabetes 08/04/2015  . Thoracic aortic atherosclerosis ([place]) 06/16/2015  . 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  . UTI (lower urinary tract infection) 11/22/2014  . Low back pain 11/22/2014  . Spondylolisthesis of lumbar region 11/18/2014  . Lumbar radiculopathy 08/21/2014  . Degeneration of lumbar or lumbosacral intervertebral disc 08/21/2014  . Left hip pain 08/21/2014  . Osteopenia 06/23/2014  . Major depression in remission ([place]) 06/23/2014  . Anxiety 06/23/2014  . Menopause 06/23/2014    Conditions to be addressed/monitored: HTN and Frequent Falls, Degeneration of Lumbar or Lumbosacral Intervertebral Disc.  Level of care concerns, ADL/IADL Limitations, Limited Access  to Caregiver, Memory Deficits and Inability to Perform ADL's Independently.  Care Plan : LCSW Plan of Care  Updates made by Francis Gaines, LCSW since 02/10/2021 12:00 AM    Problem: Requires Assistance with Activities of Daily Living Due to Fall Risk.   Priority: High    Long-Range Goal: Improve Quality of Life through [facility] and Respite Services.   Start Date: 01/19/2021  Expected End Date: 04/20/2021  This Visit's Progress: On track  Recent Progress: On track  Priority: High  Note:   Current barriers:    Unable to perform activities of daily living independently - needs additional assistance, support, and resources in order to meet this unmet need.  Frequent falls - continue to receive home health physical therapy to provide strengthening, mobility, conditioning, and safety techniques to reduce fall risk.    Knowledge deficits related to available community agencies and resources - information and application provided for Aid and Attendance Benefits through [employer].    Clinical Goals: Over the next 90 days, patient will demonstrate improved health management independence as evidenced by having in-home care services in place to assist with activities of daily living and reduced risk for falls.  Interventions : . Assessed needs, level of care concerns, basic eligibility and provided education on Aid and Attendance Benefits through [employer]. Nash Dimmer with appropriate clinical care team members regarding patient needs.   Discussed plans with patient for ongoing care management follow up and provided patient with direct contact information for care management team.  Advised patient to gather all necessary paperwork and documentation necessary to submit with application for Aid and Attendance Benefits through [employer]. . Other interventions provided: problem solving and resource sharing. Marland Kitchen Collaboration with PCP regarding  development and update of comprehensive plan of care as evidenced by provider attestation and co-signature.   Encourage physical activity, such as performance of self-care at highest level of ability, strength and balance exercise program, and provision of appropriate assistive devices: continue home health physical therapy and utilize walker when ambulating without assistance.   Patient Goals/Self-Care Activities:  . Decide with husband whether or not you'll are interested in applying for Aid and Attendance Benefits through [employer] and notify CSW at your earliest convenience.  . Continue to gather all of husband's Verdie Drown) [employer] documents required to submit with the application for Aid and Attendance Benefits, through [employer]. Gay Filler, review and complete contents of packet and be prepared to discuss any questions that arise with CSW during our next scheduled phone conversation:  o How to Apply for Aid and Attendance o Examination for Housebound Status or Permanent Need for Regular Aid and Attendance  o Statement in Support of Claim o Information and instructions to Help You Complete the Authorization to [place] to a Third Party o Aid and Careers adviser . Continue to work with home health physical therapist on conditioning and safety techniques, as well as to regain strength and mobility, in order to perform all activities of daily living independently. . Continue to increase level of activity/exercise, as tolerated.        Follow Up Plan: LCSW will follow up with patient by phone on 02/24/2021 at [place] Social Worker [place]  [phone]  Agree with plan. [name], NP

## 2021-02-10 NOTE — Patient Instructions (Signed)
Visit Information  PATIENT GOALS: Goals Addressed              This Visit's Progress   .  Apply for Aid and Attendance Benefits (pt-stated)        Timeframe:  Long-Range Goal Priority:  Medium Start Date:  01/19/2021                           Expected End Date:  04/18/2021                      Follow Up Date: 02/24/2021 at [place] with husband whether or not you'll are interested in applying for Aid and Attendance Benefits through [employer] and notify CSW at your earliest convenience.  . Continue to gather all of husband's Verdie Drown) [employer] documents required to submit with the application for Aid and Attendance Benefits, through [employer]. Gay Filler, review and complete contents of packet and be prepared to discuss any questions that arise with CSW during our next scheduled phone conversation:          How to Apply for Aid and Attendance Examination for Housebound Status or Permanent Need for Regular Aid and Attendance  Statement in Support of Claim Information and instructions to Help You Complete the Authorization to [place] to a Third Party Aid and Attendance Application . Continue to work with home health physical therapist on conditioning and safety techniques, as well as to regain strength and mobility, in order to perform all activities of daily living independently. . Continue to increase level of activity/exercise, as tolerated.  Why is this important?    Knowing how and where to find help for yourself or family in your neighborhood and community is an important skill.   You will want to take some steps to learn how.          Patient verbalizes understanding of instructions provided today and agrees to view in [place].   Telephone follow up appointment with care management team member scheduled for:  02/24/2021 at [place] Clinical Social Worker [place]  [phone]

## 2021-02-11 ENCOUNTER — Ambulatory Visit: Payer: Medicare Other | Admitting: Family

## 2021-02-12 ENCOUNTER — Other Ambulatory Visit: Payer: Self-pay | Admitting: Family

## 2021-02-12 ENCOUNTER — Other Ambulatory Visit: Payer: Self-pay | Admitting: Student in an Organized Health Care Education/Training Program

## 2021-02-12 DIAGNOSIS — I7090 Unspecified atherosclerosis: Secondary | ICD-10-CM | POA: Diagnosis not present

## 2021-02-12 DIAGNOSIS — R42 Dizziness and giddiness: Secondary | ICD-10-CM | POA: Diagnosis not present

## 2021-02-12 DIAGNOSIS — K5790 Diverticulosis of intestine, part unspecified, without perforation or abscess without bleeding: Secondary | ICD-10-CM | POA: Diagnosis not present

## 2021-02-12 DIAGNOSIS — K219 Gastro-esophageal reflux disease without esophagitis: Secondary | ICD-10-CM | POA: Diagnosis not present

## 2021-02-12 DIAGNOSIS — F32A Depression, unspecified: Secondary | ICD-10-CM | POA: Diagnosis not present

## 2021-02-12 DIAGNOSIS — D631 Anemia in chronic kidney disease: Secondary | ICD-10-CM | POA: Diagnosis not present

## 2021-02-12 DIAGNOSIS — I69398 Other sequelae of cerebral infarction: Secondary | ICD-10-CM | POA: Diagnosis not present

## 2021-02-12 DIAGNOSIS — E785 Hyperlipidemia, unspecified: Secondary | ICD-10-CM | POA: Diagnosis not present

## 2021-02-12 DIAGNOSIS — M549 Dorsalgia, unspecified: Secondary | ICD-10-CM | POA: Diagnosis not present

## 2021-02-12 DIAGNOSIS — N1831 Chronic kidney disease, stage 3a: Secondary | ICD-10-CM | POA: Diagnosis not present

## 2021-02-12 DIAGNOSIS — J9611 Chronic respiratory failure with hypoxia: Secondary | ICD-10-CM | POA: Diagnosis not present

## 2021-02-12 DIAGNOSIS — I129 Hypertensive chronic kidney disease with stage 1 through stage 4 chronic kidney disease, or unspecified chronic kidney disease: Secondary | ICD-10-CM | POA: Diagnosis not present

## 2021-02-12 DIAGNOSIS — J8410 Pulmonary fibrosis, unspecified: Secondary | ICD-10-CM | POA: Diagnosis not present

## 2021-02-18 DIAGNOSIS — N1831 Chronic kidney disease, stage 3a: Secondary | ICD-10-CM | POA: Diagnosis not present

## 2021-02-18 DIAGNOSIS — M549 Dorsalgia, unspecified: Secondary | ICD-10-CM | POA: Diagnosis not present

## 2021-02-18 DIAGNOSIS — K5790 Diverticulosis of intestine, part unspecified, without perforation or abscess without bleeding: Secondary | ICD-10-CM | POA: Diagnosis not present

## 2021-02-18 DIAGNOSIS — D631 Anemia in chronic kidney disease: Secondary | ICD-10-CM | POA: Diagnosis not present

## 2021-02-18 DIAGNOSIS — E785 Hyperlipidemia, unspecified: Secondary | ICD-10-CM | POA: Diagnosis not present

## 2021-02-18 DIAGNOSIS — R42 Dizziness and giddiness: Secondary | ICD-10-CM | POA: Diagnosis not present

## 2021-02-18 DIAGNOSIS — J8410 Pulmonary fibrosis, unspecified: Secondary | ICD-10-CM | POA: Diagnosis not present

## 2021-02-18 DIAGNOSIS — J9611 Chronic respiratory failure with hypoxia: Secondary | ICD-10-CM | POA: Diagnosis not present

## 2021-02-18 DIAGNOSIS — I69398 Other sequelae of cerebral infarction: Secondary | ICD-10-CM | POA: Diagnosis not present

## 2021-02-18 DIAGNOSIS — I7090 Unspecified atherosclerosis: Secondary | ICD-10-CM | POA: Diagnosis not present

## 2021-02-18 DIAGNOSIS — I129 Hypertensive chronic kidney disease with stage 1 through stage 4 chronic kidney disease, or unspecified chronic kidney disease: Secondary | ICD-10-CM | POA: Diagnosis not present

## 2021-02-18 DIAGNOSIS — K219 Gastro-esophageal reflux disease without esophagitis: Secondary | ICD-10-CM | POA: Diagnosis not present

## 2021-02-18 DIAGNOSIS — F32A Depression, unspecified: Secondary | ICD-10-CM | POA: Diagnosis not present

## 2021-02-24 ENCOUNTER — Ambulatory Visit: Payer: Medicare Other | Admitting: *Deleted

## 2021-02-24 DIAGNOSIS — M1612 Unilateral primary osteoarthritis, left hip: Secondary | ICD-10-CM

## 2021-02-24 DIAGNOSIS — Z9889 Other specified postprocedural states: Secondary | ICD-10-CM

## 2021-02-24 DIAGNOSIS — M5137 Other intervertebral disc degeneration, lumbosacral region: Secondary | ICD-10-CM

## 2021-02-24 DIAGNOSIS — M7062 Trochanteric bursitis, left hip: Secondary | ICD-10-CM | POA: Diagnosis not present

## 2021-02-24 DIAGNOSIS — G894 Chronic pain syndrome: Secondary | ICD-10-CM

## 2021-02-24 DIAGNOSIS — R296 Repeated falls: Secondary | ICD-10-CM

## 2021-02-24 DIAGNOSIS — Z96641 Presence of right artificial hip joint: Secondary | ICD-10-CM

## 2021-02-24 DIAGNOSIS — M51379 Other intervertebral disc degeneration, lumbosacral region without mention of lumbar back pain or lower extremity pain: Secondary | ICD-10-CM

## 2021-02-24 NOTE — Patient Instructions (Signed)
Visit Information  PATIENT GOALS: Goals Addressed              This Visit's Progress   .  COMPLETED: Apply for Aid and Attendance Benefits through [employer]. (pt-stated)   On track     Timeframe:  Short-Term Goal Priority:  High Start Date:  01/19/2021                           Expected End Date:  02/24/2021                      Follow Up Date: No follow-up required, unless further assistance is needed.  . Continue to gather all of husband's Verdie Drown) [employer] documents required to submit with the application for Aid and Attendance Benefits, through [employer]. . Review and complete contents of packet, emailed [email or web address]) to you today, per your request:        How to Apply for Aid and Attendance Examination for Housebound Status or Permanent Need for Regular Aid and Attendance  Statement in Support of Claim Information and instructions to Help You Complete the Authorization to [place] to a Third Party Aid and Attendance Application . Notify CSW if you need assistance with application completion/submission. . [place] to schedule an appointment to receive assistance with application completion/submission.       [email or web address]; [address] [place] [address]; # [phone]. Marland Kitchen Continue to work with home health physical therapist on conditioning and safety techniques, as well as to regain strength and mobility, in order to perform all activities of daily living independently. . Continue to increase level of activity/exercise, as tolerated.         Patient verbalizes understanding of instructions provided today and agrees to view in [place].   Follow Up Date: No follow-up required, unless further assistance is needed.  Nat Christen LCSW Licensed Clinical Social Worker [place]  [phone]

## 2021-02-24 NOTE — Chronic Care Management (AMB) (Signed)
Chronic Care Management    Clinical Social Work Note  02/24/2021 Name: Melody Ochoa MRN: [id number] DOB: Oct 06, 1941  Melody Ochoa is a 80 y.o. year old female who is a primary care patient of Burnard Hawthorne, FNP. The CCM team was consulted to assist the patient with chronic disease management and/or care coordination needs related to: [employer] with regards to applying for Aid and Attendance Benefits through [employer].   Engaged with patient by telephone for follow up visit in response to provider referral for social work chronic care management and care coordination services.   Consent to Services:  The patient was given information about Chronic Care Management services, agreed to services, and gave verbal consent prior to initiation of services.  Please see initial visit note for detailed documentation.   Patient agreed to services and consent obtained.   Assessment: Review of patient past medical history, allergies, medications, and health status, including review of relevant consultants reports was performed today as part of a comprehensive evaluation and provision of chronic care management and care coordination services.     SDOH (Social Determinants of Health) assessments and interventions performed:    Advanced Directives Status: Not ready or willing to discuss.  CCM Care Plan  Allergies  Allergen Reactions  . Latex Itching  . Aleve [Naproxen Sodium] Other (See Comments)    Gi upset  . Aspirin Other (See Comments)    Stomach pain-aggravates diverticulosis  . Celebrex [Celecoxib] Other (See Comments)    Dizziness   . Effexor [Venlafaxine] Other (See Comments)    Hot flashes   . Gabapentin Other (See Comments)    "Night terrors"  . Hydrocodone-Homatropine Diarrhea  . Ibuprofen Other (See Comments)    Gi upset   . Mobic [Meloxicam]     Stomach upset  . Tape Itching and Other (See Comments)    Itchy blisters  . Vioxx [Rofecoxib]  Other (See Comments)    Gi upset  . Other Rash and Other (See Comments)    bandaides  . Tramadol Other (See Comments)    hallucinations    Outpatient Encounter Medications as of 02/24/2021  Medication Sig  . albuterol (VENTOLIN HFA) 108 (90 Base) MCG/ACT inhaler TAKE 2 PUFFS BY MOUTH EVERY 6 HOURS AS NEEDED FOR WHEEZE OR SHORTNESS OF BREATH  . amLODipine (NORVASC) 5 MG tablet TAKE ONE TABLET BY MOUTH EVERY DAY  . aspirin 81 MG EC tablet TAKE 1 TABLET (81 MG TOTAL) BY MOUTH DAILY FOR 21 DAYS. SWALLOW WHOLE.  Marland Kitchen atorvastatin (LIPITOR) 80 MG tablet Take 1 tablet (80 mg total) by mouth daily.  Marland Kitchen buPROPion (WELLBUTRIN XL) 300 MG 24 hr tablet Take 1 tablet (300 mg total) by mouth daily.  . clopidogrel (PLAVIX) 75 MG tablet Take 1 tablet (75 mg total) by mouth daily.  . DULoxetine (CYMBALTA) 30 MG capsule Take 1 capsule (30 mg total) by mouth daily.  . famotidine (PEPCID) 20 MG tablet Take 1 tablet (20 mg total) by mouth at bedtime.  . fluticasone (FLONASE) 50 MCG/ACT nasal spray SPRAY 2 SPRAYS INTO EACH NOSTRIL EVERY DAY  . pregabalin (LYRICA) 50 MG capsule 50 mg during day, 100 mg at night (Patient taking differently: Take 50-100 mg by mouth See admin instructions. Take 1 capsule (50mg ) by mouth every morning and take 2 capsules (100mg ) by mouth at night)   No facility-administered encounter medications on file as of 02/24/2021.    Patient Active Problem List   Diagnosis Date Noted  .  Adenoma of left adrenal gland 12/25/2020  . Ischemic stroke ([place]) 12/10/2020  . Chronic kidney disease, stage 3a ([place]) 12/09/2020  . Chronic respiratory failure with hypoxia ([place]) 12/09/2020  . Fall at home, initial encounter 12/09/2020  . Right shoulder pain 12/09/2020  . Memory deficit 09/24/2020  . Pulmonary fibrosis ([place]) 09/14/2020  . Chronic cough 09/14/2020  . Arthritis of left sacroiliac joint 08/26/2020  . Chronic left SI joint pain 08/26/2020  . CKD (chronic kidney disease) 07/23/2020  . HTN  (hypertension) 04/23/2020  . Positive fecal immunochemical test   . Angiodysplasia of intestinal tract   . Transient ischemic attack (TIA) 10/08/2019  . Transient right leg weakness 09/30/2019  . Peripheral neuropathy 08/10/2019  . Primary osteoarthritis of left shoulder 07/24/2019  . Elevated blood pressure reading 06/27/2019  . Left shoulder pain 05/04/2019  . Anemia 03/19/2019  . Long term current use of opiate analgesic 01/09/2019  . Foraminal stenosis of lumbar region 11/14/2018  . Chronic pain syndrome 09/12/2018  . SOB (shortness of breath) on exertion 08/11/2018  . Dysphagia 08/11/2018  . Cough 06/30/2018  . Rash 01/16/2018  . Hyperlipidemia 10/18/2017  . History of lumbar surgery 10/04/2017  . Depression, recurrent ([place]) 09/27/2017  . Pulmonary nodules 09/27/2017  . History of CVA (cerebrovascular accident) 09/12/2017  . Cerebellar stroke, acute ([place]) 08/10/2017  . Primary osteoarthritis of left hip 02/28/2017  . Strain of left hip 02/28/2017  . B12 deficiency 12/21/2016  . Generalized OA 05/13/2016  . Status post total replacement of right hip 04/22/2016  . Personal history of colonic polyps   . Greater trochanteric bursitis of both hips 01/09/2016  . Prediabetes 08/04/2015  . Thoracic aortic atherosclerosis ([place]) 06/16/2015  . 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  . UTI (lower urinary tract infection) 11/22/2014  . Low back pain 11/22/2014  . Spondylolisthesis of lumbar region 11/18/2014  . Lumbar radiculopathy 08/21/2014  . Degeneration of lumbar or lumbosacral intervertebral disc 08/21/2014  . Left hip pain 08/21/2014  . Osteopenia 06/23/2014  . Major depression in remission ([place]) 06/23/2014  . Anxiety 06/23/2014  . Menopause 06/23/2014    Conditions to be addressed/monitored: Unsteady Gait, Degeneration of Lumbar or Lumbosacral Intervertebral Disc, Impaired Mobility. ADL/IADL Limitations.  Care Plan : LCSW Plan of Care  Updates made by Francis Gaines, LCSW since 02/24/2021 12:00 AM    Problem: Requires Assistance with Activities of Daily Living Due to Fall Risk. Resolved 02/24/2021  Priority: High    Goal: Improve Quality of Life through [place]. Completed 02/24/2021  Start Date: 01/19/2021  Expected End Date: 02/24/2021  This Visit's Progress: On track  Recent Progress: On track  Priority: High  Note:   Current barriers:    Unable to perform activities of daily living independently - needs additional assistance, support, and resources in order to meet this unmet need.  Frequent falls - continue to receive home health physical therapy to provide strengthening, mobility, conditioning, and safety techniques to reduce fall risk.    Knowledge deficits related to available community agencies and resources - information and application provided for Aid and Attendance Benefits through [employer].   Clinical Goals: Over the next 60 days, patient will demonstrate improved health management independence as evidenced by having in-home care services in place to assist with activities of daily living and reduced risk for falls. Interventions : . Assessed needs, level of care concerns, basic eligibility and provided education on Aid and Attendance Benefits through [employer]. Marland Kitchen  Collaborated with appropriate clinical care team members regarding patient needs.  . Emailed patient a new packet of resource information, per her request.  Discussed plans with patient for ongoing care management follow up and provided patient with direct contact information for care management team.  Advised patient to gather all necessary paperwork and documentation necessary to submit with application for Aid and Attendance Benefits through [employer]. . Other interventions provided: problem solving and resource sharing. Marland Kitchen Collaboration with PCP regarding development and update of comprehensive plan of  care as evidenced by provider attestation and co-signature.   Encourage physical activity, such as performance of self-care at highest level of ability, strength and balance exercise program, and provision of appropriate assistive devices: continue home health physical therapy and utilize walker when ambulating without assistance.  Patient Goals/Self-Care Activities: . Continue to gather all of husband's Melody Ochoa) [employer] documents required to submit with the application for Aid and Attendance Benefits, through [employer]. . Review and complete contents of packet, emailed [email or web address]) to you today, per your request:        How to Apply for Aid and Attendance Examination for Housebound Status or Permanent Need for Regular Aid and Attendance  Statement in Support of Claim Information and instructions to Help You Complete the Authorization to [place] to a Third Party Aid and Attendance Application . Notify CSW if you need assistance with application completion/submission. . [place] to schedule an appointment to receive assistance with application completion/submission.       [email or web address]; [address] [place] [address]; # [phone]. Marland Kitchen Continue to work with home health physical therapist on conditioning and safety techniques, as well as to regain strength and mobility, in order to perform all activities of daily living independently. . Continue to increase level of activity/exercise, as tolerated. Follow Up Date: No follow-up required, unless further assistance is needed.       Follow Up Date: No follow-up required, unless further assistance is needed.      Nat Christen LCSW Licensed Clinical Social Worker [place]  [phone]

## 2021-02-25 DIAGNOSIS — E785 Hyperlipidemia, unspecified: Secondary | ICD-10-CM | POA: Diagnosis not present

## 2021-02-25 DIAGNOSIS — J8410 Pulmonary fibrosis, unspecified: Secondary | ICD-10-CM | POA: Diagnosis not present

## 2021-02-25 DIAGNOSIS — N1831 Chronic kidney disease, stage 3a: Secondary | ICD-10-CM | POA: Diagnosis not present

## 2021-02-25 DIAGNOSIS — F32A Depression, unspecified: Secondary | ICD-10-CM | POA: Diagnosis not present

## 2021-02-25 DIAGNOSIS — R42 Dizziness and giddiness: Secondary | ICD-10-CM | POA: Diagnosis not present

## 2021-02-25 DIAGNOSIS — I7090 Unspecified atherosclerosis: Secondary | ICD-10-CM | POA: Diagnosis not present

## 2021-02-25 DIAGNOSIS — K219 Gastro-esophageal reflux disease without esophagitis: Secondary | ICD-10-CM | POA: Diagnosis not present

## 2021-02-25 DIAGNOSIS — I129 Hypertensive chronic kidney disease with stage 1 through stage 4 chronic kidney disease, or unspecified chronic kidney disease: Secondary | ICD-10-CM | POA: Diagnosis not present

## 2021-02-25 DIAGNOSIS — J9611 Chronic respiratory failure with hypoxia: Secondary | ICD-10-CM | POA: Diagnosis not present

## 2021-02-25 DIAGNOSIS — I69398 Other sequelae of cerebral infarction: Secondary | ICD-10-CM | POA: Diagnosis not present

## 2021-02-25 DIAGNOSIS — M549 Dorsalgia, unspecified: Secondary | ICD-10-CM | POA: Diagnosis not present

## 2021-02-25 DIAGNOSIS — D631 Anemia in chronic kidney disease: Secondary | ICD-10-CM | POA: Diagnosis not present

## 2021-02-25 DIAGNOSIS — K5790 Diverticulosis of intestine, part unspecified, without perforation or abscess without bleeding: Secondary | ICD-10-CM | POA: Diagnosis not present

## 2021-03-01 ENCOUNTER — Other Ambulatory Visit: Payer: Self-pay | Admitting: Family Medicine

## 2021-03-01 LAB — CUP PACEART REMOTE DEVICE CHECK
Date Time Interrogation Session: 20220323010014
Implantable Pulse Generator Implant Date: 20180918

## 2021-03-01 IMAGING — US BILATERAL CAROTID DUPLEX ULTRASOUND
1 series · 13 of 24 positions shown · non-contrast
Comparison: None.

CLINICAL DATA: 78-year-old female with a history of stroke

EXAM:
BILATERAL CAROTID DUPLEX ULTRASOUND
TECHNIQUE: Gray scale imaging, color Doppler and duplex ultrasound were
performed of bilateral carotid and vertebral arteries in the neck.

[Series 1: bilateral carotid duplex ultrasound · 0.06mm/px · 13 of 67 slices shown]
[im 1/67]
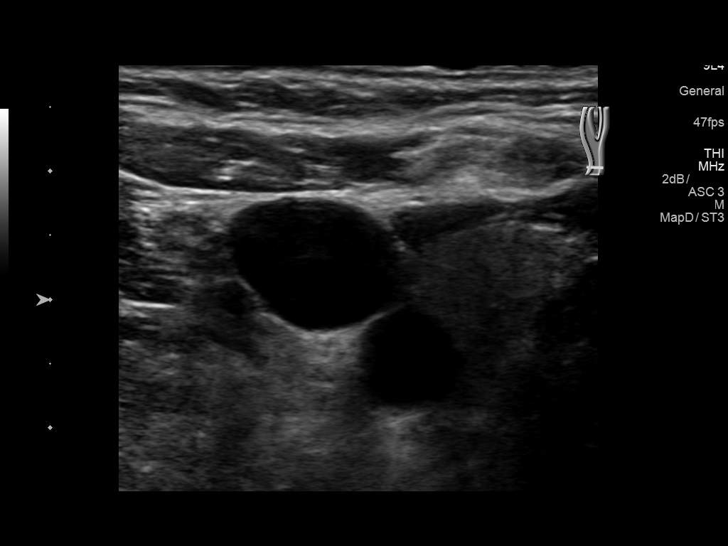
[im 6/67]
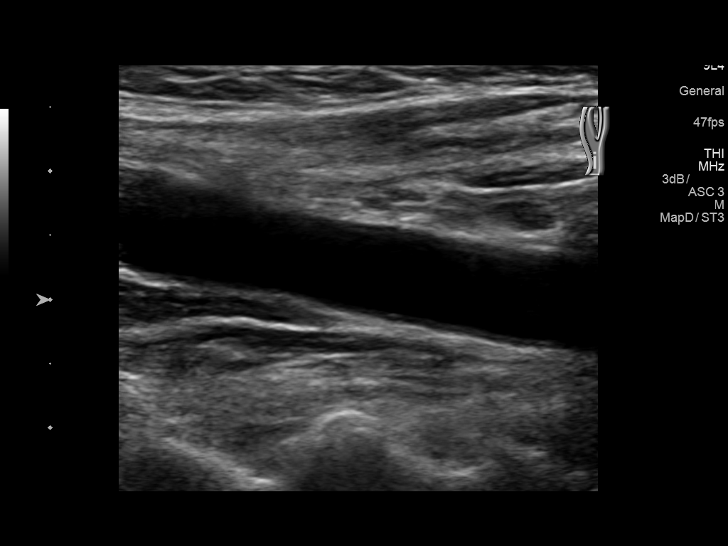
[im 12/67]
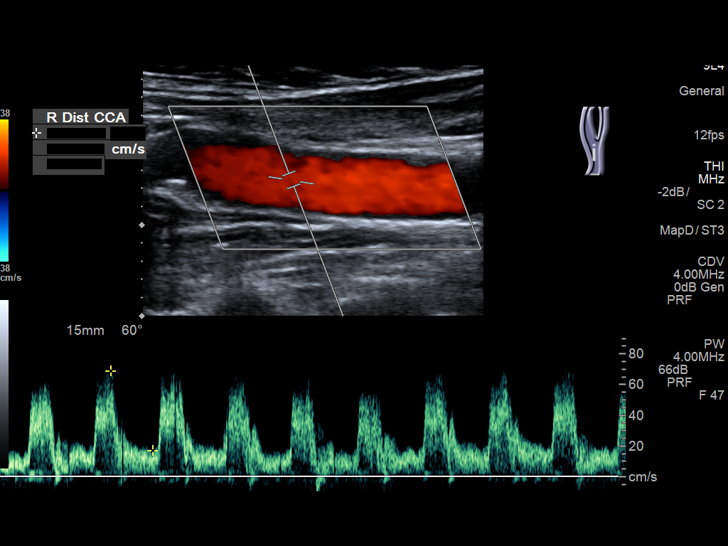
[im 18/67]
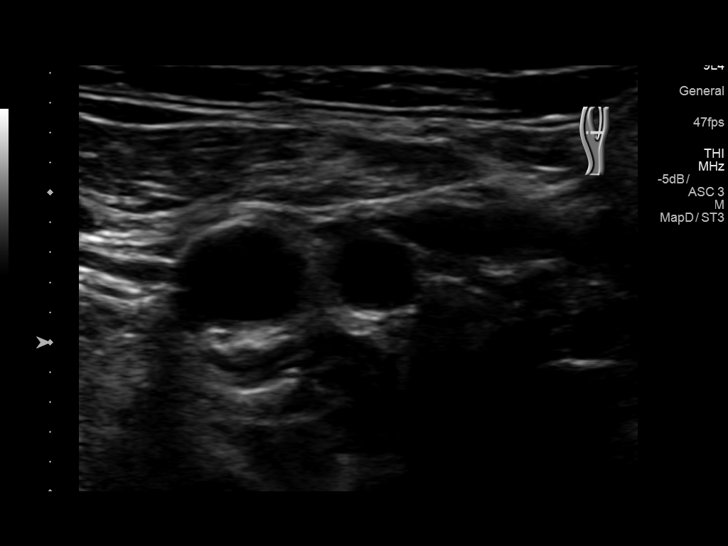
[im 23/67]
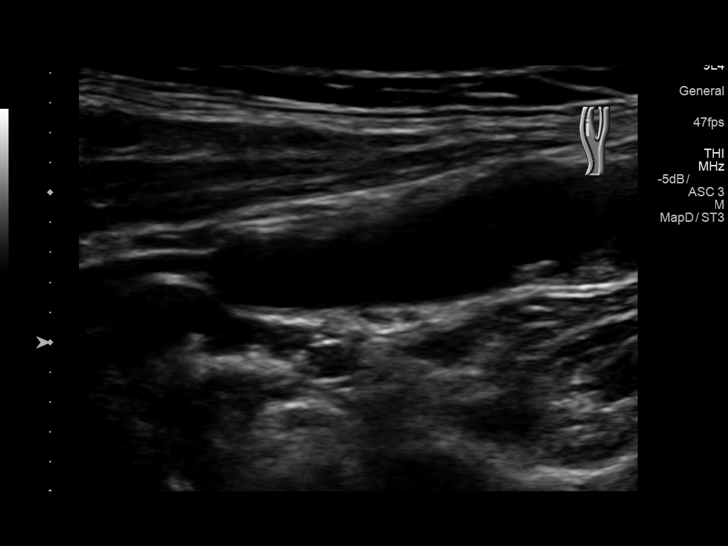
[im 29/67]
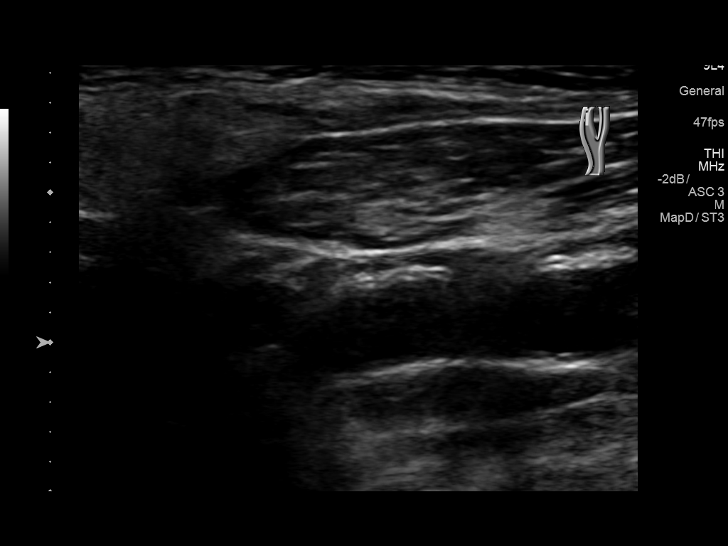
[im 35/67]
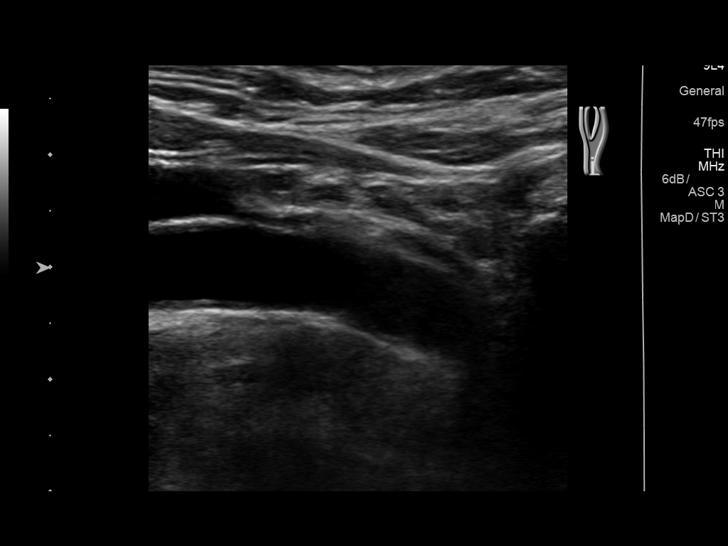
[im 38/67]
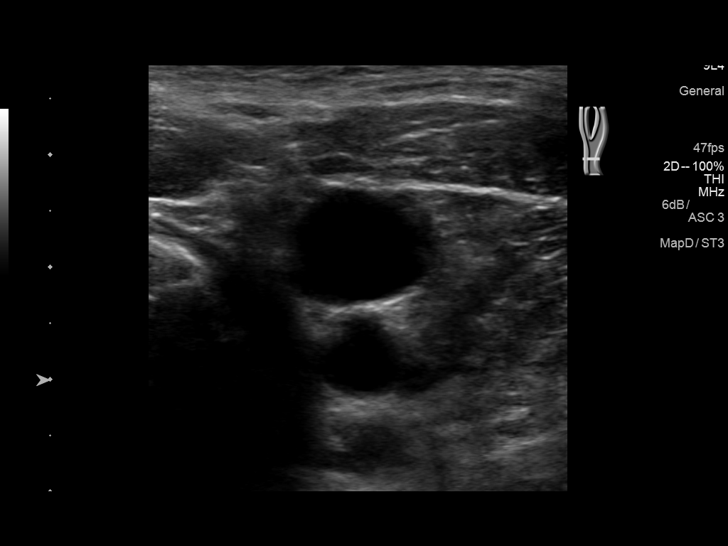
[im 44/67]
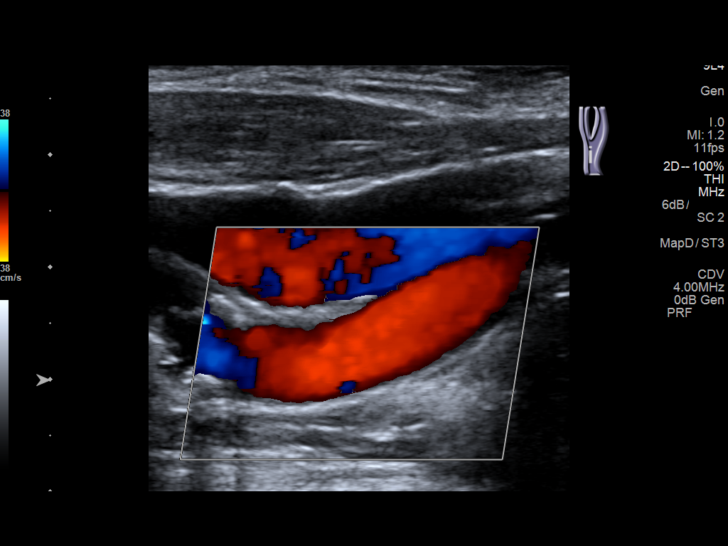
[im 49/67]
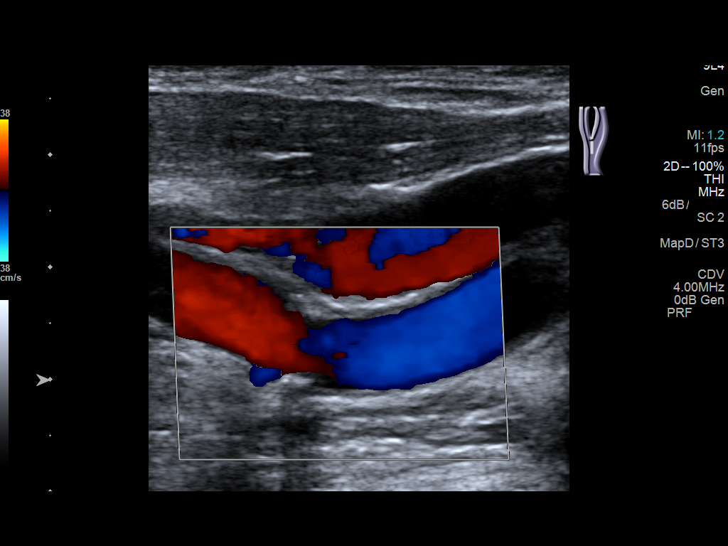
[im 55/67]
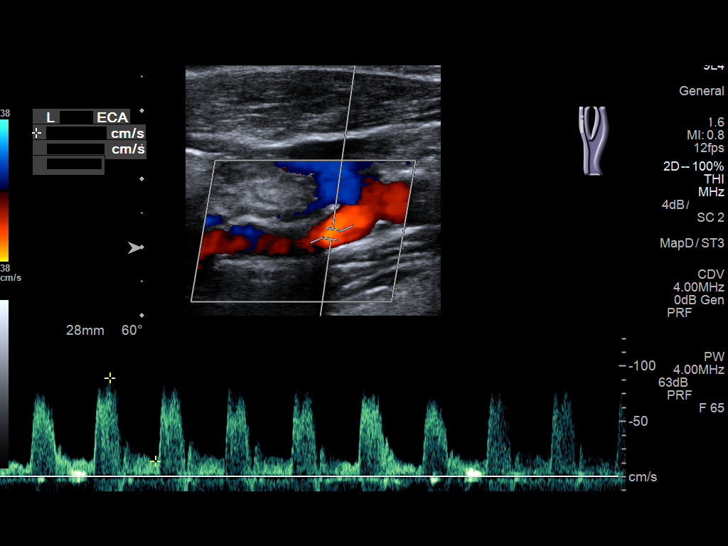
[im 61/67]
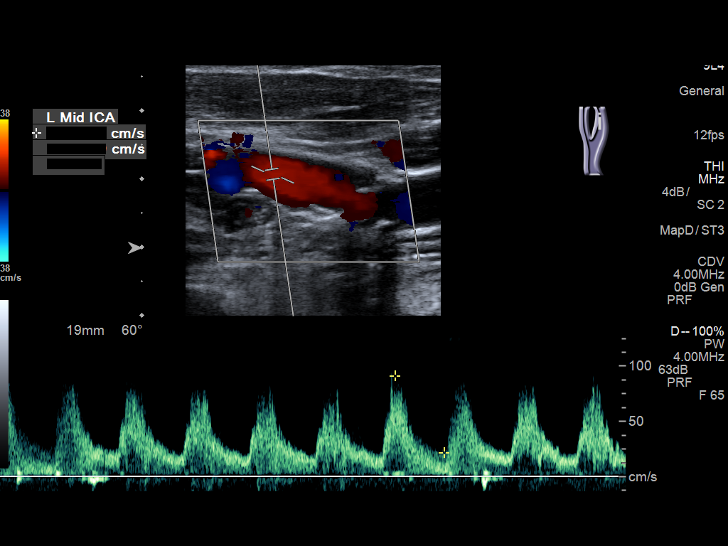
[im 67/67]
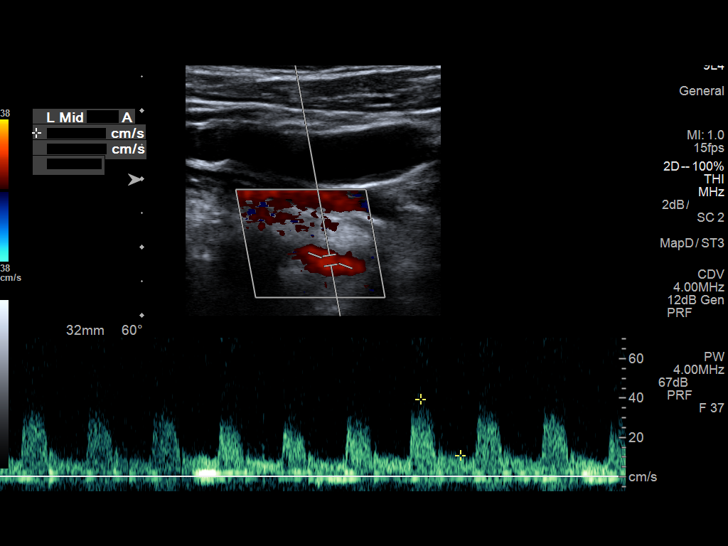

[13 of 24 positions shown; findings below may reference images not displayed]

FINDINGS: Criteria: Quantification of carotid stenosis is based on velocity
parameters that correlate the residual internal carotid diameter
with NASCET-based stenosis levels, using the diameter of the distal
internal carotid lumen as the denominator for stenosis measurement.

The following velocity measurements were obtained:

RIGHT

ICA:  Systolic 96 cm/sec, Diastolic 30 cm/sec

CCA:  71 cm/sec

SYSTOLIC ICA/CCA RATIO:

ECA:  75 cm/sec

LEFT

ICA:  Systolic 117 cm/sec, Diastolic 27 cm/sec

CCA:  95 cm/sec

SYSTOLIC ICA/CCA RATIO:

ECA:  89 cm/sec

Right Brachial SBP: Not acquired

Left Brachial SBP: Not acquired

RIGHT CAROTID ARTERY: No significant calcifications of the right
common carotid artery. Intermediate waveform maintained.
Heterogeneous and partially calcified plaque at the right carotid
bifurcation. No significant lumen shadowing. Low resistance waveform
of the right ICA. No significant tortuosity.

RIGHT VERTEBRAL ARTERY: Antegrade flow with low resistance waveform.

LEFT CAROTID ARTERY: No significant calcifications of the left
common carotid artery. Intermediate waveform maintained.
Heterogeneous and partially calcified plaque at the left carotid
bifurcation without significant lumen shadowing. Low resistance
waveform of the left ICA. No significant tortuosity.

LEFT VERTEBRAL ARTERY:  Antegrade flow with low resistance waveform.
IMPRESSION: Color duplex indicates minimal heterogeneous and calcified plaque,
with no hemodynamically significant stenosis by duplex criteria in
the extracranial cerebrovascular circulation.

## 2021-03-02 ENCOUNTER — Ambulatory Visit (INDEPENDENT_AMBULATORY_CARE_PROVIDER_SITE_OTHER): Payer: Medicare Other

## 2021-03-02 DIAGNOSIS — J8410 Pulmonary fibrosis, unspecified: Secondary | ICD-10-CM | POA: Diagnosis not present

## 2021-03-02 DIAGNOSIS — J9611 Chronic respiratory failure with hypoxia: Secondary | ICD-10-CM | POA: Diagnosis not present

## 2021-03-02 DIAGNOSIS — I639 Cerebral infarction, unspecified: Secondary | ICD-10-CM

## 2021-03-02 DIAGNOSIS — K5790 Diverticulosis of intestine, part unspecified, without perforation or abscess without bleeding: Secondary | ICD-10-CM | POA: Diagnosis not present

## 2021-03-02 DIAGNOSIS — I129 Hypertensive chronic kidney disease with stage 1 through stage 4 chronic kidney disease, or unspecified chronic kidney disease: Secondary | ICD-10-CM | POA: Diagnosis not present

## 2021-03-02 DIAGNOSIS — D631 Anemia in chronic kidney disease: Secondary | ICD-10-CM | POA: Diagnosis not present

## 2021-03-02 DIAGNOSIS — K219 Gastro-esophageal reflux disease without esophagitis: Secondary | ICD-10-CM | POA: Diagnosis not present

## 2021-03-02 DIAGNOSIS — N1831 Chronic kidney disease, stage 3a: Secondary | ICD-10-CM | POA: Diagnosis not present

## 2021-03-02 DIAGNOSIS — E785 Hyperlipidemia, unspecified: Secondary | ICD-10-CM | POA: Diagnosis not present

## 2021-03-02 DIAGNOSIS — M549 Dorsalgia, unspecified: Secondary | ICD-10-CM | POA: Diagnosis not present

## 2021-03-02 DIAGNOSIS — F32A Depression, unspecified: Secondary | ICD-10-CM | POA: Diagnosis not present

## 2021-03-02 DIAGNOSIS — I7090 Unspecified atherosclerosis: Secondary | ICD-10-CM | POA: Diagnosis not present

## 2021-03-02 DIAGNOSIS — R42 Dizziness and giddiness: Secondary | ICD-10-CM | POA: Diagnosis not present

## 2021-03-02 DIAGNOSIS — I69398 Other sequelae of cerebral infarction: Secondary | ICD-10-CM | POA: Diagnosis not present

## 2021-03-03 ENCOUNTER — Encounter: Payer: Self-pay | Admitting: *Deleted

## 2021-03-03 ENCOUNTER — Telehealth: Payer: Self-pay | Admitting: *Deleted

## 2021-03-03 NOTE — Telephone Encounter (Signed)
  Chronic Care Management   Outreach Note  03/03/2021 Name: Melody Ochoa MRN: [id number] DOB: 1941-06-23  Referred by: Burnard Hawthorne, FNP Reason for referral : Chronic Care Management (Per Provider Request, Resource Information Mailed to Patient.)   An unsuccessful telephone outreach was attempted today. The patient was referred to the case management team for assistance with care management and care coordination. LCSW left HIPAA compliant messages on voicemail for patient and is currently awaiting a return call.  In HIPAA compliant messages left for patient, LCSW explained that she will be receiving resource information in the mail, in preparation for her visit with [name], [facility] Nurse Practitioner with [facility], scheduled for Friday, March 06, 2021.  LCSW will request that Melody Ochoa notify LCSW if patient is interested in receiving long-term care placement into an assisted living facility, so that LCSW can proceed in assisting patient with placement arrangements.  The following list of resources were mailed to patient's home today:  Assisted Living, [place] Directory Assisted Living Facilities [place] Care [place]-2 Form [place] Medicaid Request for Prior Approval SNF Facilities in [place]   No follow-up appointment has been scheduled with patient at this time, as follow-up is contingent upon patient's agreement to placement.  Nat Christen LCSW Licensed Clinical Social Worker [place]  [phone]

## 2021-03-06 ENCOUNTER — Encounter: Payer: Self-pay | Admitting: Family

## 2021-03-06 ENCOUNTER — Other Ambulatory Visit: Payer: Self-pay

## 2021-03-06 ENCOUNTER — Ambulatory Visit (INDEPENDENT_AMBULATORY_CARE_PROVIDER_SITE_OTHER): Payer: Medicare Other | Admitting: Family

## 2021-03-06 VITALS — BP 122/68 | HR 81 | Temp 97.9°F | Ht 64.0 in | Wt 189.4 lb

## 2021-03-06 DIAGNOSIS — I10 Essential (primary) hypertension: Secondary | ICD-10-CM

## 2021-03-06 DIAGNOSIS — Z8673 Personal history of transient ischemic attack (TIA), and cerebral infarction without residual deficits: Secondary | ICD-10-CM | POA: Diagnosis not present

## 2021-03-06 DIAGNOSIS — F325 Major depressive disorder, single episode, in full remission: Secondary | ICD-10-CM | POA: Diagnosis not present

## 2021-03-06 DIAGNOSIS — G894 Chronic pain syndrome: Secondary | ICD-10-CM

## 2021-03-06 DIAGNOSIS — I639 Cerebral infarction, unspecified: Secondary | ICD-10-CM

## 2021-03-06 NOTE — Patient Instructions (Addendum)
Nice to see you!   

## 2021-03-06 NOTE — Assessment & Plan Note (Signed)
Controlled. Continue wellbutrin 300mg .

## 2021-03-06 NOTE — Progress Notes (Signed)
Subjective:    Patient ID: Melody Ochoa, female    DOB: [birth year], 80 y.o.   MRN: [id number]  CC: Melody Ochoa is a 80 y.o. female who presents today for follow up.   HPI: She would like to discuss moving from home to assisted living.   She fell 7 days ago while at the [facility] and hit left side of head. Bruising has improved.  She thinks she turned around to quickly and landed on concrete. No loc. No associated left arm pain, facial pain, dizziness, vision changes, vomiting.  She falls frequently.   She is not following with neurology and canceled consult earlier in the year.   She doesn't have concerns for memory. She is the primary caregiver for husband. Husband is a English as a second language teacher. She does the grocery shopping. Husband drives, she doesn't drive as license as expired. She would like to get license renewed 'just in case' she needs to drive. She doesn't have family near by, nor that is involved. She manages finances and is not lapsing on bills. She has neighbor that can be helpful.   She lives in house, one story.   HTN- compliant with amlodipine 5mg .  Anemia -she is not taking iron. No blood from stool. Colonoscopy 02/2020  Depression- she has been on wellbutrin 300mg  'for years' . She doesn't want to change this medication.   Chronic low back pain- compliant with cymbalta 30mg  qd. She follows with pain management Dr Holley Raring whom prescribes the lyrica 50mg . Pain is well controlled. She is using a cane.    She has been following with social work, Di Kindle  H/o CVA, last CVA 1/22 She is on plavix and asa 81mg . Compliant with lipitor 80mg   She has followed with Dr Cari Caraway, neurosurgery in the past.     HISTORY:  Past Medical History:  Diagnosis Date  . Arthritis   . Atheromatous plaque 10/18/2017  . Back pain   . Cancer ([place])    skin  . Depression   . Diverticulosis   . Dizziness    patient had episode of dizziness when came in room. hx past no dx  . GERD (gastroesophageal  reflux disease)   . Heart murmur   . Hyperlipidemia 10/18/2017  . PONV (postoperative nausea and vomiting)    yrs ago  . Squamous cell carcinoma of skin 11/16/2016   L chest parasternal  . Squamous cell carcinoma of skin 02/05/2020   L cheek inf to lat zygoma  . Stroke [facility])    Past Surgical History:  Procedure Laterality Date  . BACK SURGERY  13  . BREAST BIOPSY Bilateral    cores "years ago"  . BREAST EXCISIONAL BIOPSY Left 90's  . CATARACT EXTRACTION W/PHACO Left 12/20/2018   Procedure: CATARACT EXTRACTION PHACO AND INTRAOCULAR LENS PLACEMENT ([place]) LEFT TOPICAL;  Surgeon: Leandrew Koyanagi, MD;  Location: [place];  Service: Ophthalmology;  Laterality: Left;  . CATARACT EXTRACTION W/PHACO Right 01/24/2019   Procedure: CATARACT EXTRACTION PHACO AND INTRAOCULAR LENS PLACEMENT ([place]) RIGHT;  Surgeon: Leandrew Koyanagi, MD;  Location: [place];  Service: Ophthalmology;  Laterality: Right;  . CERVICAL DISC SURGERY  09  . COLONOSCOPY WITH PROPOFOL N/A 01/13/2016   Procedure: COLONOSCOPY WITH PROPOFOL;  Surgeon: [name], MD;  Location: [facility] ENDOSCOPY;  Service: Endoscopy;  Laterality: N/A;  . COLONOSCOPY WITH PROPOFOL N/A 02/19/2020   Procedure: COLONOSCOPY WITH PROPOFOL;  Surgeon: [name], MD;  Location: [facility] ENDOSCOPY;  Service: Gastroenterology;  Laterality: N/A;  . FACELIFT  94  . LOOP RECORDER INSERTION N/A 08/23/2017   Procedure: LOOP RECORDER INSERTION;  Surgeon: Thompson Grayer, MD;  Location: [place] CV LAB;  Service: Cardiovascular;  Laterality: N/A;  . TEE WITHOUT CARDIOVERSION N/A 08/12/2017   Procedure: TRANSESOPHAGEAL ECHOCARDIOGRAM (TEE);  Surgeon: Sueanne Margarita, MD;  Location: [facility] ENDOSCOPY;  Service: Cardiovascular;  Laterality: N/A;  . TONSILLECTOMY    . TOTAL HIP ARTHROPLASTY Right 04/22/2016   Procedure: TOTAL HIP ARTHROPLASTY;  Surgeon: Corky Mull, MD;  Location: [facility] ORS;  Service: Orthopedics;  Laterality: Right;   Family History   Problem Relation Age of Onset  . Lung cancer Father   . Aneurysm Brother   . Stroke Brother   . Diabetes Maternal Grandmother   . Kidney disease Maternal Grandfather   . Cushing syndrome Paternal Grandmother   . Dementia Paternal Grandfather   . Breast cancer Maternal Aunt 52    Allergies: Latex, Aleve [naproxen sodium], Aspirin, Celebrex [celecoxib], Effexor [venlafaxine], Gabapentin, Hydrocodone-homatropine, Ibuprofen, Mobic [meloxicam], Tape, Vioxx [rofecoxib], Other, and Tramadol Current Outpatient Medications on File Prior to Visit  Medication Sig Dispense Refill  . albuterol (VENTOLIN HFA) 108 (90 Base) MCG/ACT inhaler TAKE 2 PUFFS BY MOUTH EVERY 6 HOURS AS NEEDED FOR WHEEZE OR SHORTNESS OF BREATH 8.5 each 2  . amLODipine (NORVASC) 5 MG tablet TAKE ONE TABLET BY MOUTH EVERY DAY 30 tablet 0  . atorvastatin (LIPITOR) 80 MG tablet Take 1 tablet (80 mg total) by mouth daily. 90 tablet 0  . buPROPion (WELLBUTRIN XL) 300 MG 24 hr tablet Take 1 tablet (300 mg total) by mouth daily. 90 tablet 1  . clopidogrel (PLAVIX) 75 MG tablet Take 1 tablet (75 mg total) by mouth daily. 90 tablet 3  . CVS ASPIRIN LOW STRENGTH 81 MG EC tablet TAKE 1 TABLET (81 MG TOTAL) BY MOUTH DAILY FOR 21 DAYS. SWALLOW WHOLE. (Patient taking differently: Take 81 mg by mouth daily.) 21 tablet 0  . DULoxetine (CYMBALTA) 30 MG capsule Take 1 capsule (30 mg total) by mouth daily. 90 capsule 1  . famotidine (PEPCID) 20 MG tablet Take 1 tablet (20 mg total) by mouth at bedtime. 90 tablet 1  . fluticasone (FLONASE) 50 MCG/ACT nasal spray SPRAY 2 SPRAYS INTO EACH NOSTRIL EVERY DAY 48 mL 1  . pregabalin (LYRICA) 50 MG capsule 50 mg during day, 100 mg at night (Patient taking differently: Take 50-100 mg by mouth See admin instructions. Take 1 capsule (50mg ) by mouth every morning and take 2 capsules (100mg ) by mouth at night) 90 capsule 3   No current facility-administered medications on file prior to visit.    Social History    Tobacco Use  . Smoking status: Former Smoker    Packs/day: 0.75    Years: 40.00    Pack years: 30.00    Types: Cigarettes    Quit date: 11/13/1996    Years since quitting: 24.3  . Smokeless tobacco: Never Used  Vaping Use  . Vaping Use: Never used  Substance Use Topics  . Alcohol use: Yes    Comment: occ wine  . Drug use: No    Review of Systems  Constitutional: Negative for chills and fever.  Respiratory: Negative for cough.   Cardiovascular: Negative for chest pain and palpitations.  Gastrointestinal: Negative for nausea and vomiting.  Neurological: Negative for dizziness and headaches.      Objective:    BP 122/68   Pulse 81   Temp 97.9 F (36.6 C)   Ht 5'  4" (1.626 m)   Wt 189 lb 6.4 oz (85.9 kg)   SpO2 95%   BMI 32.51 kg/m  BP Readings from Last 3 Encounters:  03/06/21 122/68  12/24/20 102/61  12/25/20 118/78   Wt Readings from Last 3 Encounters:  03/06/21 189 lb 6.4 oz (85.9 kg)  12/24/20 200 lb (90.7 kg)  12/24/20 188 lb 12.8 oz (85.6 kg)    Physical Exam Vitals reviewed.  Constitutional:      Appearance: She is well-developed.  HENT:     Head:      Comments: Yellow bruising noted right temporal area. No swelling.  Eyes:     Conjunctiva/sclera: Conjunctivae normal.  Cardiovascular:     Rate and Rhythm: Normal rate and regular rhythm.     Pulses: Normal pulses.     Heart sounds: Normal heart sounds.  Pulmonary:     Effort: Pulmonary effort is normal.     Breath sounds: Normal breath sounds. No wheezing, rhonchi or rales.  Skin:    General: Skin is warm and dry.  Neurological:     Mental Status: She is alert.  Psychiatric:        Speech: Speech normal.        Behavior: Behavior normal.        Thought Content: Thought content normal.        Assessment & Plan:   Problem List Items Addressed This Visit      Cardiovascular and Mediastinum   Cerebellar stroke, acute ([place]) - Primary   Relevant Orders   Ambulatory referral to  Neurology   HTN (hypertension)    Controlled. Continue amlodipine 5mg       Relevant Orders   CBC with Differential/Platelet   Comprehensive metabolic panel   Ischemic stroke ([place])    Long discussion in regards to the nature of her falls and that she is on asa 81mg  and plavix 75mg  qd. Most recent stroke 12/2020 and she canceled appointment with [facility] neurology. I have emphasized the importance of this appointment, in particular, as I would like to advice as to keeping her on both plavix, asa. Referral placed. We also discussed assisted living versus in home services ( VA benefit) ; she has been following with social work, Administrator, arts regarding options. We agreed that patient should complete application for assistance in home through  [facility] [place] as well as the list of assisted living facilities in [facility] which has been mailed to patient ( she has yet to receive).  Continue lipitor 80mg . Will follow.        Other   Chronic pain syndrome    Pain controlled. Lyrica as prescribed by Dr Holley Raring. Continue cymbalta 30mg  qd      Major depression in remission ([place])    Controlled. Continue wellbutrin 300mg .           I am having [name] maintain her pregabalin, fluticasone, famotidine, DULoxetine, clopidogrel, buPROPion, atorvastatin, albuterol, amLODipine, and CVS Aspirin Low Strength.   No orders of the defined types were placed in this encounter.   Return precautions given.   Risks, benefits, and alternatives of the medications and treatment plan prescribed today were discussed, and patient expressed understanding.   Education regarding symptom management and diagnosis given to patient on AVS.  Continue to follow with Burnard Hawthorne, FNP for routine health maintenance.   [name] and I agreed with plan.   [name], FNP

## 2021-03-06 NOTE — Assessment & Plan Note (Signed)
Pain controlled. Lyrica as prescribed by Dr Holley Raring. Continue cymbalta 30mg  qd

## 2021-03-06 NOTE — Assessment & Plan Note (Signed)
Controlled. Continue amlodipine 5mg 

## 2021-03-06 NOTE — Assessment & Plan Note (Addendum)
Long discussion in regards to the nature of her falls and that she is on asa 81mg  and plavix 75mg  qd. Most recent stroke 12/2020 and she canceled appointment with [facility] neurology. I have emphasized the importance of this appointment, in particular, as I would like to advice as to keeping her on both plavix, asa. Referral placed. We also discussed assisted living versus in home services ( VA benefit) ; she has been following with social work, Administrator, arts regarding options. We agreed that patient should complete application for assistance in home through  [facility] [place] as well as the list of assisted living facilities in [facility] which has been mailed to patient ( she has yet to receive).  Continue lipitor 80mg . Will follow.

## 2021-03-07 LAB — COMPREHENSIVE METABOLIC PANEL
AG Ratio: 1.3 (calc) (ref 1.0–2.5)
ALT: 16 U/L (ref 6–29)
AST: 16 U/L (ref 10–35)
Albumin: 3.9 g/dL (ref 3.6–5.1)
Alkaline phosphatase (APISO): 66 U/L (ref 37–153)
BUN/Creatinine Ratio: 15 (calc) (ref 6–22)
BUN: 19 mg/dL (ref 7–25)
CO2: 24 mmol/L (ref 20–32)
Calcium: 9 mg/dL (ref 8.6–10.4)
Chloride: 104 mmol/L (ref 98–110)
Creat: 1.25 mg/dL — ABNORMAL HIGH (ref 0.60–0.88)
Globulin: 2.9 g/dL (calc) (ref 1.9–3.7)
Glucose, Bld: 141 mg/dL — ABNORMAL HIGH (ref 65–99)
Potassium: 3.9 mmol/L (ref 3.5–5.3)
Sodium: 139 mmol/L (ref 135–146)
Total Bilirubin: 0.3 mg/dL (ref 0.2–1.2)
Total Protein: 6.8 g/dL (ref 6.1–8.1)

## 2021-03-07 LAB — CBC WITH DIFFERENTIAL/PLATELET
Absolute Monocytes: 651 cells/uL (ref 200–950)
Basophils Absolute: 79 cells/uL (ref 0–200)
Basophils Relative: 0.9 %
Eosinophils Absolute: 493 cells/uL (ref 15–500)
Eosinophils Relative: 5.6 %
HCT: 32.9 % — ABNORMAL LOW (ref 35.0–45.0)
Hemoglobin: 10.8 g/dL — ABNORMAL LOW (ref 11.7–15.5)
Lymphs Abs: 1663 cells/uL (ref 850–3900)
MCH: 28.3 pg (ref 27.0–33.0)
MCHC: 32.8 g/dL (ref 32.0–36.0)
MCV: 86.1 fL (ref 80.0–100.0)
MPV: 9.2 fL (ref 7.5–12.5)
Monocytes Relative: 7.4 %
Neutro Abs: 5914 cells/uL (ref 1500–7800)
Neutrophils Relative %: 67.2 %
Platelets: 218 10*3/uL (ref 140–400)
RBC: 3.82 10*6/uL (ref 3.80–5.10)
RDW: 13.1 % (ref 11.0–15.0)
Total Lymphocyte: 18.9 %
WBC: 8.8 10*3/uL (ref 3.8–10.8)

## 2021-03-10 ENCOUNTER — Telehealth: Payer: Medicare Other

## 2021-03-10 ENCOUNTER — Telehealth: Payer: Self-pay | Admitting: *Deleted

## 2021-03-10 NOTE — Telephone Encounter (Signed)
  Chronic Care Management   Outreach Note  03/10/2021 Name: Melody Ochoa MRN: [id number] DOB: 1941/09/23  Referred by: Burnard Hawthorne, FNP Reason for referral : Chronic Care Management to assist with long-term care placement for patient into an assisted living facility.   An unsuccessful telephone outreach was attempted today. The patient was referred to the case management team for assistance with care management and care coordination. LCSW left HIPAA compliant messages on voicemail for patient, and is currently awaiting a return call.  LCSW will make a second outreach attempt within the next 10 business days, if a return call is not received from patient in the meantime.  Follow Up Plan: Embedded Care Guide will schedule second outreach attempt.  Nat Christen LCSW Licensed Clinical Social Worker [place]  [phone]

## 2021-03-11 DIAGNOSIS — J9611 Chronic respiratory failure with hypoxia: Secondary | ICD-10-CM | POA: Diagnosis not present

## 2021-03-11 DIAGNOSIS — I129 Hypertensive chronic kidney disease with stage 1 through stage 4 chronic kidney disease, or unspecified chronic kidney disease: Secondary | ICD-10-CM | POA: Diagnosis not present

## 2021-03-11 DIAGNOSIS — N1831 Chronic kidney disease, stage 3a: Secondary | ICD-10-CM | POA: Diagnosis not present

## 2021-03-11 DIAGNOSIS — F32A Depression, unspecified: Secondary | ICD-10-CM | POA: Diagnosis not present

## 2021-03-11 DIAGNOSIS — M549 Dorsalgia, unspecified: Secondary | ICD-10-CM | POA: Diagnosis not present

## 2021-03-11 DIAGNOSIS — K219 Gastro-esophageal reflux disease without esophagitis: Secondary | ICD-10-CM | POA: Diagnosis not present

## 2021-03-11 DIAGNOSIS — I7090 Unspecified atherosclerosis: Secondary | ICD-10-CM | POA: Diagnosis not present

## 2021-03-11 DIAGNOSIS — E785 Hyperlipidemia, unspecified: Secondary | ICD-10-CM | POA: Diagnosis not present

## 2021-03-11 DIAGNOSIS — D631 Anemia in chronic kidney disease: Secondary | ICD-10-CM | POA: Diagnosis not present

## 2021-03-11 DIAGNOSIS — J8410 Pulmonary fibrosis, unspecified: Secondary | ICD-10-CM | POA: Diagnosis not present

## 2021-03-11 DIAGNOSIS — I69398 Other sequelae of cerebral infarction: Secondary | ICD-10-CM | POA: Diagnosis not present

## 2021-03-11 DIAGNOSIS — R42 Dizziness and giddiness: Secondary | ICD-10-CM | POA: Diagnosis not present

## 2021-03-11 DIAGNOSIS — K5790 Diverticulosis of intestine, part unspecified, without perforation or abscess without bleeding: Secondary | ICD-10-CM | POA: Diagnosis not present

## 2021-03-12 ENCOUNTER — Telehealth: Payer: Self-pay | Admitting: *Deleted

## 2021-03-12 NOTE — Chronic Care Management (AMB) (Signed)
  Care Management   Note  03/12/2021 Name: Melody Ochoa MRN: [id number] DOB: February 23, 1941  Melody Ochoa is a 80 y.o. year old female who is a primary care patient of Burnard Hawthorne, FNP and is actively engaged with the care management team. I reached out to [name] by phone today to assist with re-scheduling a follow up visit with the Licensed Clinical Social Worker  Follow up plan: Unsuccessful telephone outreach attempt made. A HIPAA compliant phone message was left for the patient providing contact information and requesting a return call.  The care management team will reach out to the patient again over the next 7 days.  If patient returns call to provider office, please advise to call [place] at [phone].  [place] Management

## 2021-03-13 ENCOUNTER — Other Ambulatory Visit: Payer: Self-pay | Admitting: Family

## 2021-03-13 DIAGNOSIS — J8410 Pulmonary fibrosis, unspecified: Secondary | ICD-10-CM | POA: Diagnosis not present

## 2021-03-13 NOTE — Progress Notes (Signed)
Carelink Summary Report / Loop Recorder 

## 2021-03-17 ENCOUNTER — Other Ambulatory Visit: Payer: Self-pay

## 2021-03-17 DIAGNOSIS — N189 Chronic kidney disease, unspecified: Secondary | ICD-10-CM

## 2021-03-18 ENCOUNTER — Other Ambulatory Visit: Payer: Self-pay | Admitting: Family

## 2021-03-18 MED ORDER — PREGABALIN 50 MG PO CAPS: ORAL_CAPSULE | ORAL | 3 refills | Status: DC

## 2021-03-18 NOTE — Telephone Encounter (Signed)
[facility] called and said patient is requesting her pregabalin (LYRICA) 50 MG capsule to be refilled.

## 2021-03-18 NOTE — Addendum Note (Signed)
Addended by: Elpidio Galea T on: 03/18/2021 10:17 AM   Modules accepted: Orders

## 2021-03-19 ENCOUNTER — Telehealth: Payer: Self-pay | Admitting: Family

## 2021-03-19 DIAGNOSIS — E785 Hyperlipidemia, unspecified: Secondary | ICD-10-CM | POA: Diagnosis not present

## 2021-03-19 DIAGNOSIS — J9611 Chronic respiratory failure with hypoxia: Secondary | ICD-10-CM | POA: Diagnosis not present

## 2021-03-19 DIAGNOSIS — N1831 Chronic kidney disease, stage 3a: Secondary | ICD-10-CM | POA: Diagnosis not present

## 2021-03-19 DIAGNOSIS — I129 Hypertensive chronic kidney disease with stage 1 through stage 4 chronic kidney disease, or unspecified chronic kidney disease: Secondary | ICD-10-CM | POA: Diagnosis not present

## 2021-03-19 DIAGNOSIS — I69398 Other sequelae of cerebral infarction: Secondary | ICD-10-CM | POA: Diagnosis not present

## 2021-03-19 DIAGNOSIS — I7090 Unspecified atherosclerosis: Secondary | ICD-10-CM | POA: Diagnosis not present

## 2021-03-19 DIAGNOSIS — K219 Gastro-esophageal reflux disease without esophagitis: Secondary | ICD-10-CM | POA: Diagnosis not present

## 2021-03-19 DIAGNOSIS — D631 Anemia in chronic kidney disease: Secondary | ICD-10-CM | POA: Diagnosis not present

## 2021-03-19 DIAGNOSIS — K5790 Diverticulosis of intestine, part unspecified, without perforation or abscess without bleeding: Secondary | ICD-10-CM | POA: Diagnosis not present

## 2021-03-19 DIAGNOSIS — J8410 Pulmonary fibrosis, unspecified: Secondary | ICD-10-CM | POA: Diagnosis not present

## 2021-03-19 DIAGNOSIS — R42 Dizziness and giddiness: Secondary | ICD-10-CM | POA: Diagnosis not present

## 2021-03-19 DIAGNOSIS — M549 Dorsalgia, unspecified: Secondary | ICD-10-CM | POA: Diagnosis not present

## 2021-03-19 DIAGNOSIS — F32A Depression, unspecified: Secondary | ICD-10-CM | POA: Diagnosis not present

## 2021-03-19 MED ORDER — PREGABALIN 50 MG PO CAPS: ORAL_CAPSULE | ORAL | 1 refills | Status: DC

## 2021-03-19 NOTE — Telephone Encounter (Signed)
I did print it, then realized it cant be sent that way and you needed to do your thumb print. I forwarded a duplicate request to you. The printed was shredded.

## 2021-03-19 NOTE — Telephone Encounter (Signed)
I spoke with patient who was unsure who last filled script. She stated that she cannot do without this medication bc if she does not take she is in too much pain to sleep. I called Dr. Elwyn Lade office & spoke with Etter Sjogren. Dr. Holley Raring has not filled in sometime for patient due to her not making appointments. Pt needs to be seen there for him to refill. Dr. Holley Raring however is booked out through May. Etter Sjogren advised if you would fill until patient could get in to be seen at pain management & keep appointment that he woild take back over filling for patient.   Will you refill?  I have tried to call patient to make her aware of this & to stress to her that she HAS to be seen ever 3 months at pain management to continue her refills there. I have LMTCB.

## 2021-03-19 NOTE — Telephone Encounter (Signed)
Melody Ochoa , did this lyrica refill print?   [name],   Call pharmacy and pt Dr Holley Raring appears to be prescribing Lyrica, refilled 02/12/21. I have not prescribed, please ensure not refilled from our office. Pt will need to call dr Holley Raring as a controlled substance.   I looked up patient on [place] Controlled Substances Reporting System PMP AWARE and saw no activity that raised concern of inappropriate use.

## 2021-03-19 NOTE — Telephone Encounter (Signed)
LMTCB

## 2021-03-19 NOTE — Telephone Encounter (Signed)
Noted Call pt I have refilled with one refill If she would prefer to follow here for lyrica, I can prescribe  I dont prescribe oxycodone so if she is to need that in the future, she would need to continue to follow with pain management

## 2021-03-19 NOTE — Telephone Encounter (Signed)
Pharmacy called to follow up on refill request

## 2021-03-21 DIAGNOSIS — J8410 Pulmonary fibrosis, unspecified: Secondary | ICD-10-CM | POA: Diagnosis not present

## 2021-03-23 ENCOUNTER — Telehealth: Payer: Self-pay

## 2021-03-23 NOTE — Telephone Encounter (Signed)
Pt called and wants a call back about questions regarding assisted living.

## 2021-03-24 NOTE — Chronic Care Management (AMB) (Signed)
  Care Management   Note  03/24/2021 Name: TERRISA CURFMAN MRN: [id number] DOB: 1941-07-07  COURTENEY ALDERETE is a 80 y.o. year old female who is a primary care patient of Burnard Hawthorne, FNP and is actively engaged with the care management team. I reached out to [name] by phone today to assist with re-scheduling a follow up visit with the Licensed Clinical Social Worker  Follow up plan: Telephone appointment with care management team member scheduled for:03/26/2021  Julien Oscar  Care Guide, Embedded Care Coordination [place]  Care Management

## 2021-03-26 ENCOUNTER — Ambulatory Visit (INDEPENDENT_AMBULATORY_CARE_PROVIDER_SITE_OTHER): Payer: Medicare Other | Admitting: *Deleted

## 2021-03-26 DIAGNOSIS — I639 Cerebral infarction, unspecified: Secondary | ICD-10-CM

## 2021-03-26 DIAGNOSIS — M1612 Unilateral primary osteoarthritis, left hip: Secondary | ICD-10-CM

## 2021-03-26 DIAGNOSIS — Z96641 Presence of right artificial hip joint: Secondary | ICD-10-CM

## 2021-03-26 DIAGNOSIS — N189 Chronic kidney disease, unspecified: Secondary | ICD-10-CM | POA: Diagnosis not present

## 2021-03-26 DIAGNOSIS — M5137 Other intervertebral disc degeneration, lumbosacral region: Secondary | ICD-10-CM

## 2021-03-26 DIAGNOSIS — Z9889 Other specified postprocedural states: Secondary | ICD-10-CM

## 2021-03-26 DIAGNOSIS — R296 Repeated falls: Secondary | ICD-10-CM

## 2021-03-26 NOTE — Patient Instructions (Signed)
Visit Information  PATIENT GOALS: Goals Addressed              This Visit's Progress   .  Improve My Quality of Life through Long-Term Care Placement into an [place]. (pt-stated)   On track     Timeframe:  Short-Term Goal Priority:  High Start Date:   03/26/2021                       Expected End Date:   05/15/2021                 Follow Up Date:  04/01/2021 at 1:30pm.  Patient Goals: . Continue to review list of long-term care assisted living facilities, provided to you by LCSW, and be prepared to provide LCSW with at least 2 additional facilities of interest, in the event that [facility] and [facility] do not have bed availability, during next scheduled telephone outreach call with LCSW. Marland Kitchen Discuss payment plan options with spouse to ensure proper funding and financial ability to pay for assisted living in a long-term care facility. . Accept weekly telephone outreach calls from LCSW to receive assistance with long-term care assisted living placement.       Patient verbalizes understanding of instructions provided today and agrees to view in [place].   Telephone follow up appointment with care management team member scheduled for:  04/01/2021 at 1:30pm.  Nat Christen LCSW Licensed Clinical Social Worker [place]  [phone]

## 2021-03-26 NOTE — Telephone Encounter (Signed)
Pt called and stated that she would call Dr. Elwyn Lade office to get an appointment scheduled.

## 2021-03-26 NOTE — Telephone Encounter (Signed)
I called patient & let her know that she had actually been referred by [name] to a social worker that should be able to help her. She did not realize the appointment was this morning at 9. Luckily was a phone visit, so we disconnected so that she could be ready to receive social worker's call.

## 2021-03-26 NOTE — Chronic Care Management (AMB) (Signed)
Chronic Care Management    Clinical Social Work Note  03/26/2021 Name: Melody Ochoa MRN: [id number] DOB: 1941/07/26  Melody Ochoa is a 80 y.o. year old female who is a primary care patient of Burnard Hawthorne, FNP. The CCM team was consulted to assist the patient with chronic disease management and/or care coordination needs related to: Level of Care Concerns with regards to Degeneration of Lumbar or Lumbosacral Intervertebral Disc, History of Lumbar Surgery, Status Post Total Replacement of Right Hip, Chronic Pain Syndrome, Primary Osteoarthritis of Left Hip, Frequent Falls.   Engaged with patient by telephone for follow up visit in response to provider referral for social work chronic care management and care coordination services.   Consent to Services:  The patient was given information about Chronic Care Management services, agreed to services, and gave verbal consent prior to initiation of services.  Please see initial visit note for detailed documentation.   Patient agreed to services and consent obtained.   Assessment: Review of patient past medical history, allergies, medications, and health status, including review of relevant consultants reports was performed today as part of a comprehensive evaluation and provision of chronic care management and care coordination services.     SDOH (Social Determinants of Health) assessments and interventions performed:    Advanced Directives Status: Not addressed in this encounter.  CCM Care Plan  Allergies  Allergen Reactions  . Latex Itching  . Aleve [Naproxen Sodium] Other (See Comments)    Gi upset  . Aspirin Other (See Comments)    Stomach pain-aggravates diverticulosis  . Celebrex [Celecoxib] Other (See Comments)    Dizziness   . Effexor [Venlafaxine] Other (See Comments)    Hot flashes   . Gabapentin Other (See Comments)    "Night terrors"  . Hydrocodone-Homatropine Diarrhea  . Ibuprofen Other (See Comments)    Gi  upset   . Mobic [Meloxicam]     Stomach upset  . Tape Itching and Other (See Comments)    Itchy blisters  . Vioxx [Rofecoxib] Other (See Comments)    Gi upset  . Other Rash and Other (See Comments)    bandaides  . Tramadol Other (See Comments)    hallucinations    Outpatient Encounter Medications as of 03/26/2021  Medication Sig  . albuterol (VENTOLIN HFA) 108 (90 Base) MCG/ACT inhaler TAKE 2 PUFFS BY MOUTH EVERY 6 HOURS AS NEEDED FOR WHEEZE OR SHORTNESS OF BREATH  . amLODipine (NORVASC) 5 MG tablet TAKE ONE TABLET BY MOUTH EVERY DAY  . atorvastatin (LIPITOR) 80 MG tablet Take 1 tablet (80 mg total) by mouth daily.  Marland Kitchen buPROPion (WELLBUTRIN XL) 300 MG 24 hr tablet Take 1 tablet (300 mg total) by mouth daily.  . clopidogrel (PLAVIX) 75 MG tablet Take 1 tablet (75 mg total) by mouth daily.  . [employer] ASPIRIN LOW STRENGTH 81 MG EC tablet TAKE 1 TABLET (81 MG TOTAL) BY MOUTH DAILY FOR 21 DAYS. SWALLOW WHOLE. (Patient taking differently: Take 81 mg by mouth daily.)  . DULoxetine (CYMBALTA) 30 MG capsule Take 1 capsule (30 mg total) by mouth daily.  . famotidine (PEPCID) 20 MG tablet Take 1 tablet (20 mg total) by mouth at bedtime.  . fluticasone (FLONASE) 50 MCG/ACT nasal spray SPRAY 2 SPRAYS INTO EACH NOSTRIL EVERY DAY  . pregabalin (LYRICA) 50 MG capsule 50 mg during day, 100 mg at night   No facility-administered encounter medications on file as of 03/26/2021.    Patient Active Problem List   Diagnosis  Date Noted  . Adenoma of left adrenal gland 12/25/2020  . Ischemic stroke ([place]) 12/10/2020  . Chronic kidney disease, stage 3a ([place]) 12/09/2020  . Chronic respiratory failure with hypoxia ([place]) 12/09/2020  . Fall at home, initial encounter 12/09/2020  . Right shoulder pain 12/09/2020  . Memory deficit 09/24/2020  . Pulmonary fibrosis ([place]) 09/14/2020  . Chronic cough 09/14/2020  . Arthritis of left sacroiliac joint 08/26/2020  . Chronic left SI joint pain 08/26/2020  . CKD (chronic  kidney disease) 07/23/2020  . HTN (hypertension) 04/23/2020  . Positive fecal immunochemical test   . Angiodysplasia of intestinal tract   . Transient ischemic attack (TIA) 10/08/2019  . Transient right leg weakness 09/30/2019  . Peripheral neuropathy 08/10/2019  . Primary osteoarthritis of left shoulder 07/24/2019  . Elevated blood pressure reading 06/27/2019  . Left shoulder pain 05/04/2019  . Anemia 03/19/2019  . Long term current use of opiate analgesic 01/09/2019  . Foraminal stenosis of lumbar region 11/14/2018  . Chronic pain syndrome 09/12/2018  . SOB (shortness of breath) on exertion 08/11/2018  . Dysphagia 08/11/2018  . Cough 06/30/2018  . Rash 01/16/2018  . Hyperlipidemia 10/18/2017  . History of lumbar surgery 10/04/2017  . Depression, recurrent ([place]) 09/27/2017  . Pulmonary nodules 09/27/2017  . History of CVA (cerebrovascular accident) 09/12/2017  . Cerebellar stroke, acute ([place]) 08/10/2017  . Primary osteoarthritis of left hip 02/28/2017  . Strain of left hip 02/28/2017  . B12 deficiency 12/21/2016  . Generalized OA 05/13/2016  . Status post total replacement of right hip 04/22/2016  . Personal history of colonic polyps   . Greater trochanteric bursitis of both hips 01/09/2016  . Prediabetes 08/04/2015  . Thoracic aortic atherosclerosis ([place]) 06/16/2015  . 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  . UTI (lower urinary tract infection) 11/22/2014  . Low back pain 11/22/2014  . Spondylolisthesis of lumbar region 11/18/2014  . Lumbar radiculopathy 08/21/2014  . Degeneration of lumbar or lumbosacral intervertebral disc 08/21/2014  . Left hip pain 08/21/2014  . Osteopenia 06/23/2014  . Major depression in remission ([place]) 06/23/2014  . Anxiety 06/23/2014  . Menopause 06/23/2014    Conditions to be addressed/monitored:  Level of Care Concerns with regards to Degeneration of Lumbar or Lumbosacral Intervertebral Disc, History of Lumbar Surgery, Status Post Total  Replacement of Right Hip, Chronic Pain Syndrome, Primary Osteoarthritis of Left Hip, Frequent Falls.  Limited Social Support, Level of Care Concerns, ADL/IADL Limitations, Social Isolation, Limited Access to Caregiver and Memory Deficits.  Care Plan : LCSW Plan of Care  Updates made by Francis Gaines, LCSW since 03/26/2021 12:00 AM    Problem: Improve My Quality of Life through Long-Term Care Placement into an [place].   Priority: High    Goal: Improve My Quality of Life through Long-Term Care Placement into an [place].   Start Date: 03/26/2021  Expected End Date: 05/15/2021  This Visit's Progress: On track  Priority: High  Note:   Current Barriers:   . Patient's health has declined and she is no longer able to consistently meet ADL's/IADL's independently.   . Acknowledges deficits with education and support in order to meet this need. . Patient is unable to self administer medications as prescribed. . Patient lacks social connections and caregiver support. . Patient is experiencing memory deficits and her current level of care has been of concern for several months.   Clinical Goal(s):  Marland Kitchen Over the next 30 to 45 days, patient will be placed  into a long-term care assisted living facility, and will work with LCSW to coordinate needs. Interventions: . Patient interviewed and appropriate assessments performed. . Discussed plans with patient for ongoing care management follow up and provided patient with direct contact information for care management team member. . Assisted patient with obtaining information about health plan benefits and coverage. . Provided education to patient regarding level of care options. . Collaboration with Burnard Hawthorne, FNP regarding development and update of comprehensive plan of care as evidenced by provider attestation and co-signature. . Collaboration with Burnard Hawthorne, FNP regarding request to review and sign FL-2  Form, completed by LCSW and faxed to [facility] today (03/26/2021). Bertram Savin care team collaboration (see longitudinal plan of care). . Assessed needs and provided education on level of care and facility placement process. . Obtained patient's verbal permission to fax FL-2 Form to all long-term care assisted living facilities of interest, once received from Burnard Hawthorne, [place]. Marland Kitchen Reviewed and initiated process to obtain PASSAR #. . Assisted patient in understanding facility selection process. Nash Dimmer with identified facilities of interest (I.e [place]).  . Assist patient with faxing FL-2 Form to identified facilities of interest.   Patient Goals/Self-Care Activities: . Continue to review list of long-term care assisted living facilities, provided to you by LCSW, and be prepared to provide LCSW with at least 2 additional facilities of interest, in the event that [facility] do not have bed availability, during next scheduled telephone outreach call with LCSW. Marland Kitchen Discuss payment plan options with spouse to ensure proper funding and financial ability to pay for assisted living in a long-term care facility. . Accept weekly telephone outreach calls from LCSW to receive assistance with long-term care assisted living placement. Follow Up Plan: Telephone follow up appointment with care management team member scheduled for:  04/01/2021 at 1:30pm.     Follow Up Plan: LCSW will follow up with patient by phone on 04/01/2021 at 1:30pm.      Nat Christen LCSW Licensed Clinical Social Worker [place]  [phone]

## 2021-03-27 ENCOUNTER — Other Ambulatory Visit: Payer: Self-pay | Admitting: Family Medicine

## 2021-03-30 ENCOUNTER — Other Ambulatory Visit (INDEPENDENT_AMBULATORY_CARE_PROVIDER_SITE_OTHER): Payer: Medicare Other

## 2021-03-30 ENCOUNTER — Ambulatory Visit (INDEPENDENT_AMBULATORY_CARE_PROVIDER_SITE_OTHER): Payer: Medicare Other

## 2021-03-30 ENCOUNTER — Other Ambulatory Visit: Payer: Self-pay

## 2021-03-30 DIAGNOSIS — N189 Chronic kidney disease, unspecified: Secondary | ICD-10-CM

## 2021-03-30 DIAGNOSIS — I639 Cerebral infarction, unspecified: Secondary | ICD-10-CM | POA: Diagnosis not present

## 2021-03-31 ENCOUNTER — Other Ambulatory Visit: Payer: Self-pay | Admitting: Family

## 2021-03-31 DIAGNOSIS — N189 Chronic kidney disease, unspecified: Secondary | ICD-10-CM

## 2021-03-31 LAB — BASIC METABOLIC PANEL
BUN: 21 mg/dL (ref 6–23)
CO2: 27 mEq/L (ref 19–32)
Calcium: 9.1 mg/dL (ref 8.4–10.5)
Chloride: 107 mEq/L (ref 96–112)
Creatinine, Ser: 1.33 mg/dL — ABNORMAL HIGH (ref 0.40–1.20)
GFR: 37.69 mL/min — ABNORMAL LOW (ref >60.00)
Glucose, Bld: 89 mg/dL (ref 70–99)
Potassium: 4.1 mEq/L (ref 3.5–5.1)
Sodium: 142 mEq/L (ref 135–145)

## 2021-03-31 LAB — CUP PACEART REMOTE DEVICE CHECK
Date Time Interrogation Session: 20220425010120
Implantable Pulse Generator Implant Date: 20180918

## 2021-04-01 ENCOUNTER — Ambulatory Visit: Payer: Medicare Other | Admitting: *Deleted

## 2021-04-01 DIAGNOSIS — M1612 Unilateral primary osteoarthritis, left hip: Secondary | ICD-10-CM

## 2021-04-01 DIAGNOSIS — M5416 Radiculopathy, lumbar region: Secondary | ICD-10-CM

## 2021-04-01 DIAGNOSIS — Z9889 Other specified postprocedural states: Secondary | ICD-10-CM

## 2021-04-01 DIAGNOSIS — Z96641 Presence of right artificial hip joint: Secondary | ICD-10-CM

## 2021-04-01 DIAGNOSIS — M961 Postlaminectomy syndrome, not elsewhere classified: Secondary | ICD-10-CM

## 2021-04-01 DIAGNOSIS — G894 Chronic pain syndrome: Secondary | ICD-10-CM

## 2021-04-01 DIAGNOSIS — R296 Repeated falls: Secondary | ICD-10-CM

## 2021-04-01 DIAGNOSIS — Z8673 Personal history of transient ischemic attack (TIA), and cerebral infarction without residual deficits: Secondary | ICD-10-CM

## 2021-04-01 DIAGNOSIS — I639 Cerebral infarction, unspecified: Secondary | ICD-10-CM

## 2021-04-01 DIAGNOSIS — M5137 Other intervertebral disc degeneration, lumbosacral region: Secondary | ICD-10-CM

## 2021-04-01 DIAGNOSIS — N189 Chronic kidney disease, unspecified: Secondary | ICD-10-CM

## 2021-04-01 NOTE — Chronic Care Management (AMB) (Signed)
Chronic Care Management    Clinical Social Work Note  04/01/2021 Name: Melody Ochoa MRN: [id number] DOB: Nov 14, 1941  Melody Ochoa is a 80 y.o. year old female who is a primary care patient of Burnard Hawthorne, FNP. The CCM team was consulted to assist the patient with chronic disease management and/or care coordination needs related to: Level of Care Concerns in Patient with Degeneration of Lumbar or Lumbosacral Intervertebral Disc, History of Lumbar Surgery, Status Post Total Replacement of Right Hip, Chronic Pain Syndrome, Primary Osteoarthritis of Left Hip, Frequent Falls.    Engaged with patient by telephone for follow up visit in response to provider referral for social work chronic care management and care coordination services.   Consent to Services:  The patient was given information about Chronic Care Management services, agreed to services, and gave verbal consent prior to initiation of services.  Please see initial visit note for detailed documentation.   Patient agreed to services and consent obtained.   Assessment: Review of patient past medical history, allergies, medications, and health status, including review of relevant consultants reports was performed today as part of a comprehensive evaluation and provision of chronic care management and care coordination services.     SDOH (Social Determinants of Health) assessments and interventions performed:    Advanced Directives Status: See Care Plan for related entries. Advanced Directives ([place] documents) in place and copies verified.  CCM Care Plan  Allergies  Allergen Reactions  . Latex Itching  . Aleve [Naproxen Sodium] Other (See Comments)    Gi upset  . Aspirin Other (See Comments)    Stomach pain-aggravates diverticulosis  . Celebrex [Celecoxib] Other (See Comments)    Dizziness   . Effexor [Venlafaxine] Other (See Comments)    Hot flashes   . Gabapentin Other  (See Comments)    "Night terrors"  . Hydrocodone-Homatropine Diarrhea  . Ibuprofen Other (See Comments)    Gi upset   . Mobic [Meloxicam]     Stomach upset  . Tape Itching and Other (See Comments)    Itchy blisters  . Vioxx [Rofecoxib] Other (See Comments)    Gi upset  . Other Rash and Other (See Comments)    bandaides  . Tramadol Other (See Comments)    hallucinations    Outpatient Encounter Medications as of 04/01/2021  Medication Sig  . albuterol (VENTOLIN HFA) 108 (90 Base) MCG/ACT inhaler TAKE 2 PUFFS BY MOUTH EVERY 6 HOURS AS NEEDED FOR WHEEZE OR SHORTNESS OF BREATH  . amLODipine (NORVASC) 5 MG tablet TAKE ONE TABLET BY MOUTH EVERY DAY  . ASPIRIN LOW DOSE 81 MG EC tablet TAKE 1 TABLET (81 MG TOTAL) BY MOUTH DAILY FOR 21 DAYS. SWALLOW WHOLE.  Marland Kitchen atorvastatin (LIPITOR) 80 MG tablet Take 1 tablet (80 mg total) by mouth daily.  Marland Kitchen buPROPion (WELLBUTRIN XL) 300 MG 24 hr tablet Take 1 tablet (300 mg total) by mouth daily.  . clopidogrel (PLAVIX) 75 MG tablet Take 1 tablet (75 mg total) by mouth daily.  . DULoxetine (CYMBALTA) 30 MG capsule Take 1 capsule (30 mg total) by mouth daily.  . famotidine (PEPCID) 20 MG tablet Take 1 tablet (20 mg total) by mouth at bedtime.  . fluticasone (FLONASE) 50 MCG/ACT nasal spray SPRAY 2 SPRAYS INTO EACH NOSTRIL EVERY DAY  . pregabalin (LYRICA) 50 MG capsule 50 mg during day, 100 mg at night   No facility-administered encounter medications on file as of 04/01/2021.  Patient Active Problem List   Diagnosis Date Noted  . Adenoma of left adrenal gland 12/25/2020  . Ischemic stroke ([place]) 12/10/2020  . Chronic kidney disease, stage 3a ([place]) 12/09/2020  . Chronic respiratory failure with hypoxia ([place]) 12/09/2020  . Fall at home, initial encounter 12/09/2020  . Right shoulder pain 12/09/2020  . Memory deficit 09/24/2020  . Pulmonary fibrosis ([place]) 09/14/2020  . Chronic cough 09/14/2020  . Arthritis of left sacroiliac joint 08/26/2020  . Chronic  left SI joint pain 08/26/2020  . CKD (chronic kidney disease) 07/23/2020  . HTN (hypertension) 04/23/2020  . Positive fecal immunochemical test   . Angiodysplasia of intestinal tract   . Transient ischemic attack (TIA) 10/08/2019  . Transient right leg weakness 09/30/2019  . Peripheral neuropathy 08/10/2019  . Primary osteoarthritis of left shoulder 07/24/2019  . Elevated blood pressure reading 06/27/2019  . Left shoulder pain 05/04/2019  . Anemia 03/19/2019  . Long term current use of opiate analgesic 01/09/2019  . Foraminal stenosis of lumbar region 11/14/2018  . Chronic pain syndrome 09/12/2018  . SOB (shortness of breath) on exertion 08/11/2018  . Dysphagia 08/11/2018  . Cough 06/30/2018  . Rash 01/16/2018  . Hyperlipidemia 10/18/2017  . History of lumbar surgery 10/04/2017  . Depression, recurrent ([place]) 09/27/2017  . Pulmonary nodules 09/27/2017  . History of CVA (cerebrovascular accident) 09/12/2017  . Cerebellar stroke, acute ([place]) 08/10/2017  . Primary osteoarthritis of left hip 02/28/2017  . Strain of left hip 02/28/2017  . B12 deficiency 12/21/2016  . Generalized OA 05/13/2016  . Status post total replacement of right hip 04/22/2016  . Personal history of colonic polyps   . Greater trochanteric bursitis of both hips 01/09/2016  . Prediabetes 08/04/2015  . Thoracic aortic atherosclerosis ([place]) 06/16/2015  . 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  . UTI (lower urinary tract infection) 11/22/2014  . Low back pain 11/22/2014  . Spondylolisthesis of lumbar region 11/18/2014  . Lumbar radiculopathy 08/21/2014  . Degeneration of lumbar or lumbosacral intervertebral disc 08/21/2014  . Left hip pain 08/21/2014  . Osteopenia 06/23/2014  . Major depression in remission ([place]) 06/23/2014  . Anxiety 06/23/2014  . Menopause 06/23/2014    Conditions to be addressed/monitored: Level of Care Concerns in Patient with Degeneration of Lumbar or Lumbosacral Intervertebral Disc,  History of Lumbar Surgery, Status Post Total Replacement of Right Hip, Chronic Pain Syndrome, Primary Osteoarthritis of Left Hip, Frequent Falls.   Limited Social Support, Level of Care Concerns, ADL/IADL Limitations, Social Isolation, Limited Access to Caregiver and Memory Deficits.  Care Plan : LCSW Plan of Care  Updates made by Francis Gaines, LCSW since 04/01/2021 12:00 AM    Problem: Improve My Quality of Life through Long-Term Care Placement into an [place].   Priority: High    Goal: Improve My Quality of Life through Long-Term Care Placement into an [place].   Start Date: 03/26/2021  Expected End Date: 05/15/2021  This Visit's Progress: On track  Recent Progress: On track  Priority: High  Note:   Current Barriers:   . Patient's health has declined and she is no longer able to consistently meet ADL's/IADL's independently.   . Acknowledges deficits with education and support in order to meet this need. . Patient is unable to self administer medications as prescribed. . Patient lacks social connections and caregiver support. . Patient is experiencing memory deficits and her current level of care has been of concern for several months.   Clinical Goal(s):  .  Over the next 30 to 45 days, patient will be placed into a long-term care assisted living facility, and will work with LCSW to coordinate needs. Interventions: . Patient interviewed and appropriate assessments performed. . Discussed plans with patient for ongoing care management follow up and provided patient with direct contact information for care management team member. . Assisted patient with obtaining information about health plan benefits and coverage. . Provided education to patient regarding level of care options. . Collaboration with Burnard Hawthorne, FNP regarding development and update of comprehensive plan of care as evidenced by provider attestation and  co-signature. . Collaboration with Burnard Hawthorne, FNP regarding request to review and sign FL-2 Form, completed by LCSW and faxed to [facility] today (03/26/2021). Bertram Savin care team collaboration (see longitudinal plan of care). . Assessed needs and provided education on level of care and facility placement process. . Obtained patient's verbal permission to fax FL-2 Form to all long-term care assisted living facilities of interest, once received from Burnard Hawthorne, [place]. Marland Kitchen Reviewed and initiated process to obtain PASSAR #. . Assisted patient in understanding facility selection process. Nash Dimmer with identified facilities of interest (I.e [place]).  . Assist patient with faxing FL-2 Form to identified facilities of interest.   Patient Goals/Self-Care Activities: . Continue to review list of long-term care assisted living facilities, provided to you by LCSW, and be prepared to provide LCSW with at least 2 additional facilities of interest, in the event that [facility] and [facility] do not have bed availability, during next scheduled telephone outreach call with LCSW. Marland Kitchen Discuss payment plan options with spouse to ensure proper funding and financial ability to pay for assisted living in a long-term care facility. . Await return calls from [facility] and [facility] regarding bed availability/bed offers, as LCSW faxed FL-2 Form to both facilities on 03/31/2021.   Marland Kitchen Accept weekly telephone outreach calls from LCSW to receive assistance with long-term care assisted living placement. Follow Up Plan: Telephone follow-up appointment with care management team member scheduled for:  04/13/2021 at 9:00am.      Follow Up Plan: Telephone follow-up appointment with care management team member scheduled for:  04/13/2021 at [place] Licensed Clinical Social Worker [place]  [phone]

## 2021-04-01 NOTE — Patient Instructions (Signed)
Visit Information  PATIENT GOALS: Goals Addressed              This Visit's Progress   .  Improve My Quality of Life through Long-Term Care Placement into an [place]. (pt-stated)   On track     Timeframe:  Short-Term Goal Priority:  High Start Date:   03/26/2021                       Expected End Date:   05/15/2021                 Follow Up Date:   04/13/2021 at 9:00am.  Patient Goals: . Continue to review list of long-term care assisted living facilities, provided to you by LCSW, and be prepared to provide LCSW with at least 2 additional facilities of interest, in the event that [facility] and [facility] do not have bed availability, during next scheduled telephone outreach call with LCSW. Marland Kitchen Discuss payment plan options with spouse to ensure proper funding and financial ability to pay for assisted living in a long-term care facility. . Await return calls from [facility] and [facility] regarding bed availability/bed offers, as LCSW faxed FL-2 Form to both facilities on 03/31/2021.   Marland Kitchen Accept weekly telephone outreach calls from LCSW to receive assistance with long-term care assisted living placement.       Patient verbalizes understanding of instructions provided today and agrees to view in [place].   Telephone follow up appointment with care management team member scheduled for:  04/13/2021 at [place] LCSW Licensed Clinical Social Worker [place]  [phone]

## 2021-04-06 ENCOUNTER — Telehealth: Payer: Self-pay | Admitting: Family

## 2021-04-06 ENCOUNTER — Telehealth: Payer: Self-pay | Admitting: Emergency Medicine

## 2021-04-06 NOTE — Telephone Encounter (Signed)
Attempted to contact patient about possible ILR explant. Unable to reach patient by phone and no option to leave message for patient. Appointment cancelled out of Epic and patient status changed to inactive in [place]. PER DPR instructions in chart (Do not leave detailed message).

## 2021-04-06 NOTE — Telephone Encounter (Signed)
PT called and wanted to speak to [place] in regarding to what she was suppose to talk to [facility] about in regarding to some medication. She states she can't remember what it was and wanted some clarification.

## 2021-04-06 NOTE — Telephone Encounter (Signed)
I called patient to let her know that she needed to call Dr. Elwyn Lade office to make an appointment so that he can resume filling Lyrica for patient.

## 2021-04-09 ENCOUNTER — Telehealth: Payer: Self-pay

## 2021-04-09 NOTE — Telephone Encounter (Signed)
I let the patient know that her loop battery has reached the recommended replacement time (RRT). I told her she can choose to leave it in or choose to take it out. If she leaves it in it will not harm her. If she wants to have it removed she will need an appointment to talk with Dr. Caryl Comes. She would like another loop. I told her that would be a conversation with Dr. Caryl Comes. I can not guarantee she will get another one. I told her she can unplug her monitor and I will send a return kit to her address. I did verify her address and took her out of [facility]. I let her know I will send a phone note to [facility] to schedule the appointment with Dr. Caryl Comes.

## 2021-04-09 NOTE — Telephone Encounter (Signed)
See 04/09/2021 phone note.

## 2021-04-12 DIAGNOSIS — J8410 Pulmonary fibrosis, unspecified: Secondary | ICD-10-CM | POA: Diagnosis not present

## 2021-04-13 ENCOUNTER — Ambulatory Visit (INDEPENDENT_AMBULATORY_CARE_PROVIDER_SITE_OTHER): Payer: Medicare Other | Admitting: *Deleted

## 2021-04-13 ENCOUNTER — Other Ambulatory Visit: Payer: Self-pay | Admitting: Family

## 2021-04-13 DIAGNOSIS — I639 Cerebral infarction, unspecified: Secondary | ICD-10-CM

## 2021-04-13 DIAGNOSIS — G894 Chronic pain syndrome: Secondary | ICD-10-CM

## 2021-04-13 DIAGNOSIS — N189 Chronic kidney disease, unspecified: Secondary | ICD-10-CM

## 2021-04-13 DIAGNOSIS — M5137 Other intervertebral disc degeneration, lumbosacral region: Secondary | ICD-10-CM

## 2021-04-13 DIAGNOSIS — M1612 Unilateral primary osteoarthritis, left hip: Secondary | ICD-10-CM

## 2021-04-13 DIAGNOSIS — Z96641 Presence of right artificial hip joint: Secondary | ICD-10-CM

## 2021-04-13 DIAGNOSIS — R296 Repeated falls: Secondary | ICD-10-CM

## 2021-04-13 NOTE — Chronic Care Management (AMB) (Signed)
Chronic Care Management    Clinical Social Work Note  04/13/2021 Name: Melody Ochoa MRN: [id number] DOB: 1941-05-23  Melody Ochoa is a 80 y.o. year old female who is a primary care patient of Burnard Hawthorne, FNP. The CCM team was consulted to assist the patient with chronic disease management and/or care coordination needs related to: Level of Care Concerns and Caregiver Stress in Patient with Degeneration of Lumbar or Lumbosacral Intervertebral Disc, History of Lumbar Surgery, Status Post Total Replacement of Right Hip, Chronic Pain Syndrome, Primary Osteoarthritis of Left Hip, Frequent Falls.    Engaged with patient by telephone for follow up visit in response to provider referral for social work chronic care management and care coordination services.   Consent to Services:  The patient was given information about Chronic Care Management services, agreed to services, and gave verbal consent prior to initiation of services.  Please see initial visit note for detailed documentation.   Patient agreed to services and consent obtained.   Assessment: Review of patient past medical history, allergies, medications, and health status, including review of relevant consultants reports was performed today as part of a comprehensive evaluation and provision of chronic care management and care coordination services.     SDOH (Social Determinants of Health) assessments and interventions performed:    Advanced Directives Status: Advanced Directives (Living Will and Corporate investment banker) in [place].  CCM Care Plan  Allergies  Allergen Reactions  . Latex Itching  . Aleve [Naproxen Sodium] Other (See Comments)    Gi upset  . Aspirin Other (See Comments)    Stomach pain-aggravates diverticulosis  . Celebrex [Celecoxib] Other (See Comments)    Dizziness   . Effexor [Venlafaxine] Other (See Comments)    Hot flashes   . Gabapentin Other (See Comments)    "Night terrors"  .  Hydrocodone Bit-Homatrop Mbr Diarrhea  . Ibuprofen Other (See Comments)    Gi upset   . Mobic [Meloxicam]     Stomach upset  . Tape Itching and Other (See Comments)    Itchy blisters  . Vioxx [Rofecoxib] Other (See Comments)    Gi upset  . Other Rash and Other (See Comments)    bandaides  . Tramadol Other (See Comments)    hallucinations    Outpatient Encounter Medications as of 04/13/2021  Medication Sig  . albuterol (VENTOLIN HFA) 108 (90 Base) MCG/ACT inhaler TAKE 2 PUFFS BY MOUTH EVERY 6 HOURS AS NEEDED FOR WHEEZE OR SHORTNESS OF BREATH  . amLODipine (NORVASC) 5 MG tablet TAKE ONE TABLET BY MOUTH EVERY DAY  . ASPIRIN LOW DOSE 81 MG EC tablet TAKE 1 TABLET (81 MG TOTAL) BY MOUTH DAILY FOR 21 DAYS. SWALLOW WHOLE.  Marland Kitchen atorvastatin (LIPITOR) 80 MG tablet Take 1 tablet (80 mg total) by mouth daily.  Marland Kitchen buPROPion (WELLBUTRIN XL) 300 MG 24 hr tablet Take 1 tablet (300 mg total) by mouth daily.  . clopidogrel (PLAVIX) 75 MG tablet Take 1 tablet (75 mg total) by mouth daily.  . DULoxetine (CYMBALTA) 30 MG capsule Take 1 capsule (30 mg total) by mouth daily.  . famotidine (PEPCID) 20 MG tablet Take 1 tablet (20 mg total) by mouth at bedtime.  . fluticasone (FLONASE) 50 MCG/ACT nasal spray SPRAY 2 SPRAYS INTO EACH NOSTRIL EVERY DAY  . pregabalin (LYRICA) 50 MG capsule 50 mg during day, 100 mg at night   No facility-administered encounter medications on file as of 04/13/2021.    Patient Active Problem List  Diagnosis Date Noted  . Adenoma of left adrenal gland 12/25/2020  . Ischemic stroke ([place]) 12/10/2020  . Chronic kidney disease, stage 3a ([place]) 12/09/2020  . Chronic respiratory failure with hypoxia ([place]) 12/09/2020  . Fall at home, initial encounter 12/09/2020  . Right shoulder pain 12/09/2020  . Memory deficit 09/24/2020  . Pulmonary fibrosis ([place]) 09/14/2020  . Chronic cough 09/14/2020  . Arthritis of left sacroiliac joint 08/26/2020  . Chronic left SI joint pain 08/26/2020  .  CKD (chronic kidney disease) 07/23/2020  . HTN (hypertension) 04/23/2020  . Positive fecal immunochemical test   . Angiodysplasia of intestinal tract   . Transient ischemic attack (TIA) 10/08/2019  . Transient right leg weakness 09/30/2019  . Peripheral neuropathy 08/10/2019  . Primary osteoarthritis of left shoulder 07/24/2019  . Elevated blood pressure reading 06/27/2019  . Left shoulder pain 05/04/2019  . Anemia 03/19/2019  . Long term current use of opiate analgesic 01/09/2019  . Foraminal stenosis of lumbar region 11/14/2018  . Chronic pain syndrome 09/12/2018  . SOB (shortness of breath) on exertion 08/11/2018  . Dysphagia 08/11/2018  . Cough 06/30/2018  . Rash 01/16/2018  . Hyperlipidemia 10/18/2017  . History of lumbar surgery 10/04/2017  . Depression, recurrent ([place]) 09/27/2017  . Pulmonary nodules 09/27/2017  . History of CVA (cerebrovascular accident) 09/12/2017  . Cerebellar stroke, acute ([place]) 08/10/2017  . Primary osteoarthritis of left hip 02/28/2017  . Strain of left hip 02/28/2017  . B12 deficiency 12/21/2016  . Generalized OA 05/13/2016  . Status post total replacement of right hip 04/22/2016  . Personal history of colonic polyps   . Greater trochanteric bursitis of both hips 01/09/2016  . Prediabetes 08/04/2015  . Thoracic aortic atherosclerosis ([place]) 06/16/2015  . 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  . UTI (lower urinary tract infection) 11/22/2014  . Low back pain 11/22/2014  . Spondylolisthesis of lumbar region 11/18/2014  . Lumbar radiculopathy 08/21/2014  . Degeneration of lumbar or lumbosacral intervertebral disc 08/21/2014  . Left hip pain 08/21/2014  . Osteopenia 06/23/2014  . Major depression in remission ([place]) 06/23/2014  . Anxiety 06/23/2014  . Menopause 06/23/2014    Conditions to be addressed/monitored:  Level of Care Concerns and Caregiver Stress in Patient with Degeneration of Lumbar or Lumbosacral Intervertebral Disc, History of  Lumbar Surgery, Status Post Total Replacement of Right Hip, Chronic Pain Syndrome, Primary Osteoarthritis of Left Hip, Frequent Falls.   Limited Social Support, Level of Care Concerns, ADL/IADL Limitations, Limited Access to Caregiver and Memory Deficits.  Care Plan : LCSW Plan of Care  Updates made by Francis Gaines, LCSW since 04/13/2021 12:00 AM    Problem: Improve My Quality of Life through Long-Term Care Placement into an [place].   Priority: High    Goal: Improve My Quality of Life through Long-Term Care Placement into an [place].   Start Date: 03/26/2021  Expected End Date: 05/15/2021  This Visit's Progress: On track  Recent Progress: On track  Priority: High  Note:   Current Barriers:   . Patient's health has declined and she is no longer able to consistently meet ADL's/IADL's independently.   . Acknowledges deficits with education and support in order to meet this need. . Patient is unable to self administer medications as prescribed. . Patient lacks social connections and caregiver support. . Patient is experiencing memory deficits and her current level of care has been of concern for several months.   Clinical Goal(s):  Marland Kitchen Over the next  30 to 45 days, patient will be placed into a long-term care assisted living facility, and will work with LCSW to coordinate needs. Interventions: . Patient interviewed and appropriate assessments performed. . Discussed plans with patient for ongoing care management follow up and provided patient with direct contact information for care management team member. . Assisted patient with obtaining information about health plan benefits and coverage. . Provided education to patient regarding level of care options. . Collaboration with Burnard Hawthorne, FNP regarding development and update of comprehensive plan of care as evidenced by provider attestation and co-signature. . Collaboration with Burnard Hawthorne,  FNP regarding request to review and sign FL-2 Form, completed by LCSW and faxed to [facility] today (03/26/2021). Bertram Savin care team collaboration (see longitudinal plan of care). . Assessed needs and provided education on level of care and facility placement process. . Obtained patient's verbal permission to fax FL-2 Form to all long-term care assisted living facilities of interest, once received from Burnard Hawthorne, [place]. Marland Kitchen Reviewed and initiated process to obtain PASSAR #. . Assisted patient in understanding facility selection process. Nash Dimmer with identified facilities of interest ([place]).  . Assist patient with faxing FL-2 Form to identified facilities of interest.   Patient Goals/Self-Care Activities: . Continue to review list of long-term care assisted living facilities and be prepared to provide LCSW with at least 2 additional facilities of interest, in the event that [place], [place], Materials engineer at the [place] and Public librarian at [place] do not have bed availability, during next scheduled telephone outreach call with LCSW. . Have husband contact his primary care provider to request assistance with long-term care placement arrangements into an assisted living facility. . Await return calls from [place], [place], Materials engineer at the [place] and Public librarian at [place] regarding bed availability/bed offers, as LCSW faxed FL-2 Form to all facilities of interest again on 04/13/2021.   Marland Kitchen Accept weekly telephone outreach calls from LCSW to receive assistance with long-term care assisted living placement. Follow Up Plan: Telephone follow up appointment with care management team member scheduled for:  04/20/2021 at 10:00am.       Follow Up Plan: 04/20/2021 at 10:00am.      Nat Christen LCSW Licensed Clinical Social Worker [place]  [phone]

## 2021-04-13 NOTE — Patient Instructions (Signed)
Visit Information  PATIENT GOALS: Goals Addressed              This Visit's Progress   .  Improve My Quality of Life through Long-Term Care Placement into an [place]. (pt-stated)   On track     Timeframe:  Short-Term Goal Priority:  High Start Date:   03/26/2021                       Expected End Date:   05/15/2021                 Follow Up Date:   04/20/2021 at 10:00am.  Patient Goals: . Continue to review list of long-term care assisted living facilities and be prepared to provide LCSW with at least 2 additional facilities of interest, in the event that [place], [place], Materials engineer at the [place] and Public librarian at [place] do not have bed availability, during next scheduled telephone outreach call with LCSW. . Have husband contact his primary care provider to request assistance with long-term care placement arrangements into an assisted living facility. . Await return calls from [place], [place], Materials engineer at the [place] and Public librarian at [place] regarding bed availability/bed offers, as LCSW faxed FL-2 Form to all facilities of interest again on 04/13/2021.   Marland Kitchen Accept weekly telephone outreach calls from LCSW to receive assistance with long-term care assisted living placement.       Patient verbalizes understanding of instructions provided today and agrees to view in [place].   Telephone follow up appointment with care management team member scheduled for:  04/20/2021 at 10:00am.  Nat Christen LCSW Licensed Clinical Social Worker [place]  [phone]

## 2021-04-16 ENCOUNTER — Ambulatory Visit
Payer: Medicare Other | Attending: Student in an Organized Health Care Education/Training Program | Admitting: Student in an Organized Health Care Education/Training Program

## 2021-04-16 ENCOUNTER — Encounter: Payer: Self-pay | Admitting: Student in an Organized Health Care Education/Training Program

## 2021-04-16 ENCOUNTER — Telehealth: Payer: Self-pay | Admitting: Student in an Organized Health Care Education/Training Program

## 2021-04-16 ENCOUNTER — Other Ambulatory Visit: Payer: Self-pay

## 2021-04-16 VITALS — BP 129/81 | HR 84 | Temp 96.4°F | Resp 14 | Ht 64.0 in | Wt 180.0 lb

## 2021-04-16 DIAGNOSIS — S2242XS Multiple fractures of ribs, left side, sequela: Secondary | ICD-10-CM

## 2021-04-16 DIAGNOSIS — G8929 Other chronic pain: Secondary | ICD-10-CM | POA: Diagnosis not present

## 2021-04-16 DIAGNOSIS — M47818 Spondylosis without myelopathy or radiculopathy, sacral and sacrococcygeal region: Secondary | ICD-10-CM | POA: Insufficient documentation

## 2021-04-16 DIAGNOSIS — R413 Other amnesia: Secondary | ICD-10-CM

## 2021-04-16 DIAGNOSIS — S2242XA Multiple fractures of ribs, left side, initial encounter for closed fracture: Secondary | ICD-10-CM | POA: Insufficient documentation

## 2021-04-16 DIAGNOSIS — M461 Sacroiliitis, not elsewhere classified: Secondary | ICD-10-CM

## 2021-04-16 DIAGNOSIS — M533 Sacrococcygeal disorders, not elsewhere classified: Secondary | ICD-10-CM

## 2021-04-16 DIAGNOSIS — G894 Chronic pain syndrome: Secondary | ICD-10-CM

## 2021-04-16 DIAGNOSIS — G588 Other specified mononeuropathies: Secondary | ICD-10-CM | POA: Diagnosis not present

## 2021-04-16 DIAGNOSIS — M25512 Pain in left shoulder: Secondary | ICD-10-CM | POA: Diagnosis not present

## 2021-04-16 MED ORDER — OXYCODONE HCL 5 MG PO TABS: 5.0000 mg | ORAL_TABLET | Freq: Two times a day (BID) | ORAL | 0 refills | Status: AC | PRN

## 2021-04-16 NOTE — Progress Notes (Signed)
Nursing Pain Medication Assessment:  Safety precautions to be maintained throughout the outpatient stay will include: orient to surroundings, keep bed in low position, maintain call bell within reach at all times, provide assistance with transfer out of bed and ambulation.  Medication Inspection Compliance: Pill count conducted under aseptic conditions, in front of the patient. Neither the pills nor the bottle was removed from the patient's sight at any time. Once count was completed pills were immediately returned to the patient in their original bottle.  Medication: Oxycodone IR Pill/Patch Count: 5 of 45 pills remain Pill/Patch Appearance: Markings consistent with prescribed medication Bottle Appearance: Standard pharmacy container. Clearly labeled. Filled Date: 12/24/2020 Last Medication intake:  Today   States she rarely takes a "pain pill."

## 2021-04-16 NOTE — Telephone Encounter (Signed)
Escript will not go thru for this patient due to insurance saying patient is opiod naive and can only fill 7 day script. Please call pharmacy  [phone]

## 2021-04-16 NOTE — Telephone Encounter (Signed)
Spoke to [place] at [facility] and since she has not filled a controlled substance since January, she is considered opioid Naive.  They will only be able to fill 7 days worth for qty of 14 tablets and after we get PA then will need a new Rx to fill remaining qty.

## 2021-04-16 NOTE — Progress Notes (Signed)
PROVIDER NOTE: Information contained herein reflects review and annotations entered in association with encounter. Interpretation of such information and data should be left to medically-trained personnel. Information provided to patient can be located elsewhere in the medical record under "Patient Instructions". Document created using STT-dictation technology, any transcriptional errors that may result from process are unintentional.    Patient: Melody Ochoa  Service Category: E/M  Provider: Gillis Santa, MD  DOB: [birth year]  DOS: 04/16/2021  Specialty: Interventional Pain Management  MRN: [id number]  Setting: Ambulatory outpatient  PCP: Burnard Hawthorne, FNP  Type: Established Patient    Referring Provider: Burnard Hawthorne, FNP  Location: Office  Delivery: Face-to-face     HPI  Ms. Melody Ochoa, a 80 y.o. 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 Ms. Melody Ochoa primary complain today is Leg Pain (Left leg) Last encounter: My last encounter with her was on 02/12/2021. Pertinent problems: Ms. Melody Ochoa has Spondylolisthesis of lumbar region; Failed back surgical syndrome; Low back pain; History of lumbar surgery; Lumbar radiculopathy; Foraminal stenosis of lumbar region; and Left shoulder pain on their pertinent problem list. Pain Assessment: Severity of Chronic pain is reported as a 5 /10. Location: Leg Left/denies. Onset: More than a month ago. Quality: Aching. Timing: Intermittent. Modifying factor(s): lying down, Tylenol. Vitals:  height is $RemoveB'5\' 4"'sGyCErqV$  (1.626 m) and weight is 180 lb (81.6 kg). Her temporal temperature is 96.4 F (35.8 C) (abnormal). Her blood pressure is 129/81 and her pulse is 84. Her respiration is 14 and oxygen saturation is 92%.   Reason for encounter: medication management.    Patient presents today for medication management.  No significant change in her medical history.  She states that her left-sided rib fractures have improved.  She  decided not to proceed with left intercostal nerve block.  She did have 1 fall where she fell on the back of her commode hitting her head about 2 weeks ago but that has gotten better.  She takes her oxycodone very seldomly only when she is having increased pain, usually 1 tablet a week.  She has 5 tablets left so I will refill her medication today.  Her last refill was May 24, 2021.  She continues Lyrica 50 mg 3 times daily.  This is been taken over by her primary care provider.  Pharmacotherapy Assessment   Analgesic: Oxycodone 5 mg daily as needed, takes very seldomly, 1 prescription usually last 3 to 4 months.     Monitoring: [place] PMP: PDMP not reviewed this encounter.       Pharmacotherapy: No side-effects or adverse reactions reported. Compliance: No problems identified. Effectiveness: Clinically acceptable.  Landis Martins, RN  04/16/2021  9:11 AM  Sign when Signing Visit Nursing Pain Medication Assessment:  Safety precautions to be maintained throughout the outpatient stay will include: orient to surroundings, keep bed in low position, maintain call bell within reach at all times, provide assistance with transfer out of bed and ambulation.  Medication Inspection Compliance: Pill count conducted under aseptic conditions, in front of the patient. Neither the pills nor the bottle was removed from the patient's sight at any time. Once count was completed pills were immediately returned to the patient in their original bottle.  Medication: Oxycodone IR Pill/Patch Count: 5 of 45 pills remain Pill/Patch Appearance: Markings consistent with prescribed medication Bottle Appearance: Standard pharmacy container. Clearly labeled. Filled Date: 12/24/2020 Last Medication intake:  Today   States she rarely takes a "pain pill."  UDS:  Summary  Date Value Ref Range Status  01/23/2020 Note  Final    Comment:    ==================================================================== [employer] Select  13 (MW) ==================================================================== Test                             Result       Flag       Units   NO DRUGS DETECTED. ==================================================================== Test                      Result    Flag   Units      Ref Range   Creatinine              219              mg/dL      >=20 ==================================================================== Declared Medications:  The flagging and interpretation on this report are based on the  following declared medications.  Unexpected results may arise from  inaccuracies in the declared medications.  **Note: The testing scope of this panel does not include the  following reported medications:  Albuterol  Amlodipine  Atorvastatin  Bupropion  Clopidogrel  Duloxetine  Famotidine  Fluticasone  Pregabalin  Tiotropium  Triamcinolone ==================================================================== For clinical consultation, please call [phone]. ====================================================================      ROS  Constitutional: Denies any fever or chills Gastrointestinal: No reported hemesis, hematochezia, vomiting, or acute GI distress Musculoskeletal: Denies any acute onset joint swelling, redness, loss of ROM, or weakness Neurological: No reported episodes of acute onset apraxia, aphasia, dysarthria, agnosia, amnesia, paralysis, loss of coordination, or loss of consciousness  Medication Review  DULoxetine, albuterol, amLODipine, aspirin, atorvastatin, buPROPion, clopidogrel, famotidine, fluticasone, oxyCODONE, and pregabalin  History Review  Allergy: Ms. Melody Ochoa is allergic to latex, aleve [naproxen sodium], aspirin, celebrex [celecoxib], effexor [venlafaxine], gabapentin, hydrocodone bit-homatrop mbr, ibuprofen, mobic [meloxicam], tape, vioxx [rofecoxib], other, and tramadol. Drug: Ms. Melody Ochoa  reports no history of drug use. Alcohol:   reports current alcohol use. Tobacco:  reports that she quit smoking about 24 years ago. Her smoking use included cigarettes. She has a 30.00 pack-year smoking history. She has never used smokeless tobacco. Social: Ms. Melody Ochoa  reports that she quit smoking about 24 years ago. Her smoking use included cigarettes. She has a 30.00 pack-year smoking history. She has never used smokeless tobacco. She reports current alcohol use. She reports that she does not use drugs. Medical:  has a past medical history of Arthritis, Atheromatous plaque (10/18/2017), Back pain, Cancer ([place]), Depression, Diverticulosis, Dizziness, GERD (gastroesophageal reflux disease), Heart murmur, Hyperlipidemia (10/18/2017), PONV (postoperative nausea and vomiting), Squamous cell carcinoma of skin (11/16/2016), Squamous cell carcinoma of skin (02/05/2020), and Stroke ([place]). Surgical: Ms. Melody Ochoa  has a past surgical history that includes Tonsillectomy; Facelift (89); Cervical disc surgery (8); Back surgery (12); Colonoscopy with propofol (N/A, 01/13/2016); Total hip arthroplasty (Right, 04/22/2016); TEE without cardioversion (N/A, 08/12/2017); LOOP RECORDER INSERTION (N/A, 08/23/2017); Breast excisional biopsy (Left, 90's); Breast biopsy (Bilateral); Cataract extraction w/PHACO (Left, 12/20/2018); Cataract extraction w/PHACO (Right, 01/24/2019); and Colonoscopy with propofol (N/A, 02/19/2020). Family: family history includes Aneurysm in her brother; Breast cancer (age of onset: 92) in her maternal aunt; Cushing syndrome in her paternal grandmother; Dementia in her paternal grandfather; Diabetes in her maternal grandmother; Kidney disease in her maternal grandfather; Lung cancer in her father; Stroke in her brother.  Laboratory Chemistry Profile   Renal Lab Results  Component Value Date  BUN 21 03/30/2021   CREATININE 1.33 (H) 03/30/2021   BCR 15 03/06/2021   GFR 37.69 (L) 03/30/2021   GFRAA 59 (L) 03/11/2020   GFRNONAA 54 (L) 12/20/2020      Hepatic Lab Results  Component Value Date   AST 16 03/06/2021   ALT 16 03/06/2021   ALBUMIN 3.5 12/20/2020   ALKPHOS 58 12/20/2020     Electrolytes Lab Results  Component Value Date   NA 142 03/30/2021   K 4.1 03/30/2021   CL 107 03/30/2021   CALCIUM 9.1 03/30/2021     Bone No results found for: VD25OH, VD125OH2TOT, BP1025EN2, DP8242PN3, 25OHVITD1, 25OHVITD2, 25OHVITD3, TESTOFREE, TESTOSTERONE   Inflammation (CRP: Acute Phase) (ESR: Chronic Phase) No results found for: CRP, ESRSEDRATE, LATICACIDVEN     Note: Above Lab results reviewed.  Recent Imaging Review  CUP PACEART REMOTE DEVICE CHECK ILR summary report received. Battery status OK. Normal device function. No new symptom, tachy, brady, or pause episodes. No new AF episodes. Monthly summary reports and ROV/PRN Note: Reviewed        Physical Exam  General appearance: Well nourished, well developed, and well hydrated. In no apparent acute distress Mental status: Alert, oriented x 3 (person, place, & time)       Respiratory: No evidence of acute respiratory distress Eyes: PERLA Vitals: BP 129/81   Pulse 84   Temp (!) 96.4 F (35.8 C) (Temporal)   Resp 14   Ht $R'5\' 4"'Yc$  (1.626 m)   Wt 180 lb (81.6 kg)   SpO2 92%   BMI 30.90 kg/m  BMI: Estimated body mass index is 30.9 kg/m as calculated from the following:   Height as of this encounter: $RemoveBeforeD'5\' 4"'baszZufhNbzPrR$  (1.626 m).   Weight as of this encounter: 180 lb (81.6 kg). Ideal: Ideal body weight: 54.7 kg (120 lb 9.5 oz) Adjusted ideal body weight: 65.5 kg (144 lb 5.7 oz)   Lumbar Spine Area Exam  Skin & Axial Inspection: No masses, redness, or swelling Alignment: Symmetrical Functional ROM: Pain restricted ROM       Stability: No instability detected Muscle Tone/Strength: Functionally intact. No obvious neuro-muscular anomalies detected. Sensory (Neurological): Musculoskeletal pain pattern Palpation: No palpable anomalies       Provocative Tests: Hyperextension/rotation  test: deferred today       Lumbar quadrant test (Kemp's test): deferred today       Lateral bending test: deferred today       Patrick's Maneuver: (+) for left-sided S-I arthralgia             FABER* test: (+) for left-sided S-I arthralgia             S-I anterior distraction/compression test: (+)   S-I arthralgia/arthropathy Left S-I lateral compression test: Positive for   S-I arthralgia/arthropathy S-I Thigh-thrust test: deferred today         S-I Gaenslen's test: deferred today         *(Flexion, ABduction and External Rotation) Gait & Posture Assessment  Ambulation: Patient came in today in a wheel chair Gait: Limited. Using assistive device to ambulate Posture: Difficulty standing up straight, due to pain  Lower Extremity Exam    Side: Right lower extremity  Side: Left lower extremity  Stability: No instability observed          Stability: No instability observed          Skin & Extremity Inspection: Skin color, temperature, and hair growth are WNL. No peripheral edema or cyanosis. No masses, redness,  swelling, asymmetry, or associated skin lesions. No contractures.  Skin & Extremity Inspection: Skin color, temperature, and hair growth are WNL. No peripheral edema or cyanosis. No masses, redness, swelling, asymmetry, or associated skin lesions. No contractures.  Functional ROM: Unrestricted ROM                  Functional ROM: Pain restricted ROM for hip joint and sacroiliac joint          Muscle Tone/Strength: Functionally intact. No obvious neuro-muscular anomalies detected.  Muscle Tone/Strength: Functionally intact. No obvious neuro-muscular anomalies detected.  Sensory (Neurological): Unimpaired        Sensory (Neurological): Musculoskeletal pain pattern        DTR: Patellar: deferred today Achilles: deferred today Plantar: deferred today  DTR: Patellar: deferred today Achilles: deferred today Plantar: deferred today  Palpation: No palpable anomalies  Palpation: No  palpable anomalies   Assessment   Status Diagnosis  Controlled Controlled Controlled 1. Intercostal neuralgia   2. Closed fracture of multiple ribs of left side, sequela   3. Arthritis of left sacroiliac joint   4. Chronic left SI joint pain   5. Memory deficit   6. Chronic left shoulder pain   7. SI (sacroiliac) joint dysfunction   8. Chronic pain syndrome      Updated Problems: Problem  Intercostal Neuralgia  Multiple Closed Fractures of Ribs of Left Side    Plan of Care   Ms. Melody Ochoa has a current medication list which includes the following long-term medication(s): albuterol, amlodipine, atorvastatin, bupropion, clopidogrel, duloxetine, famotidine, fluticasone, and pregabalin.  Pharmacotherapy (Medications Ordered): Meds ordered this encounter  Medications  . oxyCODONE (OXY IR/ROXICODONE) 5 MG immediate release tablet    Sig: Take 1 tablet (5 mg total) by mouth 2 (two) times daily as needed for severe pain. Must last 30 days.    Dispense:  45 tablet    Refill:  0    Chronic Pain. (STOP Act - Not applicable). Fill one day early if closed on scheduled refill date.    Follow-up plan:   Return if symptoms worsen or fail to improve.     Status post left shoulder intra-articular injection, posterior approach on 08/08/2019, 09/17/2019- helped, repeat PRN.   Left sacroiliac joint injection 09/24/2020           Recent Visits No visits were found meeting these conditions. Showing recent visits within past 90 days and meeting all other requirements Today's Visits Date Type Provider Dept  04/16/21 Office Visit Gillis Santa, MD [facility]  Showing today's visits and meeting all other requirements Future Appointments No visits were found meeting these conditions. Showing future appointments within next 90 days and meeting all other requirements  I discussed the assessment and treatment plan with the patient. The patient was provided an opportunity to ask  questions and all were answered. The patient agreed with the plan and demonstrated an understanding of the instructions.  Patient advised to call back or seek an in-person evaluation if the symptoms or condition worsens.  Duration of encounter: 35 minutes.  Note by: Gillis Santa, MD Date: 04/16/2021; Time: 9:57 AM

## 2021-04-17 NOTE — Progress Notes (Signed)
Carelink Summary Report / Loop Recorder 

## 2021-04-20 ENCOUNTER — Ambulatory Visit: Payer: Medicare Other | Admitting: *Deleted

## 2021-04-20 DIAGNOSIS — M51379 Other intervertebral disc degeneration, lumbosacral region without mention of lumbar back pain or lower extremity pain: Secondary | ICD-10-CM

## 2021-04-20 DIAGNOSIS — R296 Repeated falls: Secondary | ICD-10-CM

## 2021-04-20 DIAGNOSIS — J8410 Pulmonary fibrosis, unspecified: Secondary | ICD-10-CM | POA: Diagnosis not present

## 2021-04-20 DIAGNOSIS — G894 Chronic pain syndrome: Secondary | ICD-10-CM

## 2021-04-20 DIAGNOSIS — Z96641 Presence of right artificial hip joint: Secondary | ICD-10-CM

## 2021-04-20 DIAGNOSIS — I639 Cerebral infarction, unspecified: Secondary | ICD-10-CM

## 2021-04-20 DIAGNOSIS — M5137 Other intervertebral disc degeneration, lumbosacral region: Secondary | ICD-10-CM

## 2021-04-20 DIAGNOSIS — M1612 Unilateral primary osteoarthritis, left hip: Secondary | ICD-10-CM

## 2021-04-20 DIAGNOSIS — N189 Chronic kidney disease, unspecified: Secondary | ICD-10-CM

## 2021-04-20 DIAGNOSIS — M5416 Radiculopathy, lumbar region: Secondary | ICD-10-CM

## 2021-04-20 NOTE — Patient Instructions (Signed)
Visit Information  PATIENT GOALS: Goals Addressed              This Visit's Progress   .  Improve My Quality of Life through Long-Term Care Placement into an [place]. (pt-stated)   On track     Timeframe:  Short-Term Goal Priority:  High Start Date:   03/26/2021                       Expected End Date:   05/15/2021                 Follow Up Date:   04/27/2021 at 9:30am.  Patient Goals: . Continue to review list of long-term care assisted living facilities and be prepared to provide LCSW with at least 2 additional facilities of interest, in the event that [place], [place], Materials engineer at the [place] and Public librarian at [facility] do not have bed availability, during next scheduled telephone outreach call with LCSW. Minette Brine Admissions Department at [facility] to request review of FL-2 Form and check bed availability. Minette Brine Admissions Department at [facility] to request review of FL-2 Form and check bed availability. Minette Brine Admissions Department at [facility] at the [facility] to request review of FL-2 Form and check bed availability. Minette Brine Admissions Department at [facility] at [facility] to request review of FL-2 Form and check bed availability. . Await return calls from [place], [place], Materials engineer at the [place] and Public librarian at [place] regarding bed availability/bed offers, as LCSW faxed FL-2 Form to all facilities of interest again on 04/13/2021.   Marland Kitchen Have husband contact his primary care provider to request assistance with long-term care placement arrangements into an assisted living facility. . Accept weekly telephone outreach calls from LCSW to receive assistance with long-term care assisted living placement.       Patient verbalizes understanding of instructions provided today and agrees to view in [place].   Telephone follow  up appointment with care management team member scheduled for:  04/27/2021 at 9:30am.  Nat Christen LCSW Licensed Clinical Social Worker [place]  [phone]

## 2021-04-20 NOTE — Chronic Care Management (AMB) (Signed)
Chronic Care Management    Clinical Social Work Note  04/20/2021 Name: Melody Ochoa MRN: [id number] DOB: [birth year]  Melody Ochoa is a 80 y.o. year old female who is a primary care patient of Burnard Hawthorne, FNP. The CCM team was consulted to assist the patient with chronic disease management and/or care coordination needs related to: Level of Care Concerns and Caregiver Stress in Patient with Degeneration of Lumbar or Lumbosacral Intervertebral Disc, History of Lumbar Surgery, Status Post Total Replacement of Right Hip, Chronic Pain Syndrome, Primary Osteoarthritis of Left Hip, Frequent Falls.   Engaged with patient by telephone for follow up visit in response to provider referral for social work chronic care management and care coordination services.   Consent to Services:  The patient was given information about Chronic Care Management services, agreed to services, and gave verbal consent prior to initiation of services.  Please see initial visit note for detailed documentation.   Patient agreed to services and consent obtained.   Assessment: Review of patient past medical history, allergies, medications, and health status, including review of relevant consultants reports was performed today as part of a comprehensive evaluation and provision of chronic care management and care coordination services.     SDOH (Social Determinants of Health) assessments and interventions performed:    Advanced Directives Status: Not addressed in this encounter.  CCM Care Plan  Allergies  Allergen Reactions  . Latex Itching  . Aleve [Naproxen Sodium] Other (See Comments)    Gi upset  . Aspirin Other (See Comments)    Stomach pain-aggravates diverticulosis  . Celebrex [Celecoxib] Other (See Comments)    Dizziness   . Effexor [Venlafaxine] Other (See Comments)    Hot flashes   . Gabapentin Other (See Comments)    "Night terrors"  . Hydrocodone Bit-Homatrop Mbr Diarrhea  . Ibuprofen  Other (See Comments)    Gi upset   . Mobic [Meloxicam]     Stomach upset  . Tape Itching and Other (See Comments)    Itchy blisters  . Vioxx [Rofecoxib] Other (See Comments)    Gi upset  . Other Rash and Other (See Comments)    bandaides  . Tramadol Other (See Comments)    hallucinations    Outpatient Encounter Medications as of 04/20/2021  Medication Sig  . albuterol (VENTOLIN HFA) 108 (90 Base) MCG/ACT inhaler TAKE 2 PUFFS BY MOUTH EVERY 6 HOURS AS NEEDED FOR WHEEZE OR SHORTNESS OF BREATH  . amLODipine (NORVASC) 5 MG tablet TAKE ONE TABLET BY MOUTH EVERY DAY  . ASPIRIN LOW DOSE 81 MG EC tablet TAKE 1 TABLET (81 MG TOTAL) BY MOUTH DAILY FOR 21 DAYS. SWALLOW WHOLE.  Marland Kitchen atorvastatin (LIPITOR) 80 MG tablet Take 1 tablet (80 mg total) by mouth daily.  Marland Kitchen buPROPion (WELLBUTRIN XL) 300 MG 24 hr tablet Take 1 tablet (300 mg total) by mouth daily.  . clopidogrel (PLAVIX) 75 MG tablet Take 1 tablet (75 mg total) by mouth daily.  . DULoxetine (CYMBALTA) 30 MG capsule Take 1 capsule (30 mg total) by mouth daily.  . famotidine (PEPCID) 20 MG tablet Take 1 tablet (20 mg total) by mouth at bedtime.  . fluticasone (FLONASE) 50 MCG/ACT nasal spray SPRAY 2 SPRAYS INTO EACH NOSTRIL EVERY DAY  . oxyCODONE (OXY IR/ROXICODONE) 5 MG immediate release tablet Take 1 tablet (5 mg total) by mouth 2 (two) times daily as needed for severe pain. Must last 30 days.  . pregabalin (LYRICA) 50 MG capsule 50 mg during  day, 100 mg at night   No facility-administered encounter medications on file as of 04/20/2021.    Patient Active Problem List   Diagnosis Date Noted  . Intercostal neuralgia 04/16/2021  . Multiple closed fractures of ribs of left side 04/16/2021  . Adenoma of left adrenal gland 12/25/2020  . Ischemic stroke ([place]) 12/10/2020  . Chronic kidney disease, stage 3a ([place]) 12/09/2020  . Chronic respiratory failure with hypoxia ([place]) 12/09/2020  . Fall at home, initial encounter 12/09/2020  . Right  shoulder pain 12/09/2020  . Memory deficit 09/24/2020  . Pulmonary fibrosis ([place]) 09/14/2020  . Chronic cough 09/14/2020  . Arthritis of left sacroiliac joint 08/26/2020  . Chronic left SI joint pain 08/26/2020  . CKD (chronic kidney disease) 07/23/2020  . HTN (hypertension) 04/23/2020  . Positive fecal immunochemical test   . Angiodysplasia of intestinal tract   . Transient ischemic attack (TIA) 10/08/2019  . Transient right leg weakness 09/30/2019  . Peripheral neuropathy 08/10/2019  . Primary osteoarthritis of left shoulder 07/24/2019  . Elevated blood pressure reading 06/27/2019  . Left shoulder pain 05/04/2019  . Anemia 03/19/2019  . Long term current use of opiate analgesic 01/09/2019  . Foraminal stenosis of lumbar region 11/14/2018  . Chronic pain syndrome 09/12/2018  . SOB (shortness of breath) on exertion 08/11/2018  . Dysphagia 08/11/2018  . Cough 06/30/2018  . Rash 01/16/2018  . Hyperlipidemia 10/18/2017  . History of lumbar surgery 10/04/2017  . Depression, recurrent ([place]) 09/27/2017  . Pulmonary nodules 09/27/2017  . History of CVA (cerebrovascular accident) 09/12/2017  . Cerebellar stroke, acute ([place]) 08/10/2017  . Primary osteoarthritis of left hip 02/28/2017  . Strain of left hip 02/28/2017  . B12 deficiency 12/21/2016  . Generalized OA 05/13/2016  . Status post total replacement of right hip 04/22/2016  . Personal history of colonic polyps   . Greater trochanteric bursitis of both hips 01/09/2016  . Prediabetes 08/04/2015  . Thoracic aortic atherosclerosis ([place]) 06/16/2015  . 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  . UTI (lower urinary tract infection) 11/22/2014  . Low back pain 11/22/2014  . Spondylolisthesis of lumbar region 11/18/2014  . Lumbar radiculopathy 08/21/2014  . Degeneration of lumbar or lumbosacral intervertebral disc 08/21/2014  . Left hip pain 08/21/2014  . Osteopenia 06/23/2014  . Major depression in remission ([place]) 06/23/2014  .  Anxiety 06/23/2014  . Menopause 06/23/2014    Conditions to be addressed/monitored: Level of Care Concerns and Caregiver Stress in Patient with Degeneration of Lumbar or Lumbosacral Intervertebral Disc, History of Lumbar Surgery, Status Post Total Replacement of Right Hip, Chronic Pain Syndrome, Primary Osteoarthritis of Left Hip, Frequent Falls.  Limited Social Support, Level of Care Concerns, ADL/IADL Limitations, Social Isolation, Limited Access to Caregiver and Memory Deficits.  Care Plan : LCSW Plan of Care  Updates made by Francis Gaines, LCSW since 04/20/2021 12:00 AM    Problem: Improve My Quality of Life through Long-Term Care Placement into an [place].   Priority: High    Goal: Improve My Quality of Life through Long-Term Care Placement into an [facility].   Start Date: 03/26/2021  Expected End Date: 05/15/2021  This Visit's Progress: On track  Recent Progress: On track  Priority: High  Note:   Current Barriers:   . Patient's health has declined and she is no longer able to consistently meet ADL's/IADL's independently.   . Acknowledges deficits with education and support in order to meet this need. . Patient is unable to  self administer medications as prescribed. . Patient lacks social connections and caregiver support. . Patient is experiencing memory deficits and her current level of care has been of concern for several months.   Clinical Goal(s):  Marland Kitchen Over the next 30 to 45 days, patient will be placed into a long-term care assisted living facility, and will work with LCSW to coordinate needs. Interventions: . Patient interviewed and appropriate assessments performed. . Discussed plans with patient for ongoing care management follow up and provided patient with direct contact information for care management team member. . Assisted patient with obtaining information about health plan benefits and coverage. . Provided education to patient  regarding level of care options. . Collaboration with Burnard Hawthorne, FNP regarding development and update of comprehensive plan of care as evidenced by provider attestation and co-signature. . Collaboration with Burnard Hawthorne, FNP regarding request to review and sign FL-2 Form, completed by LCSW and faxed to [facility] today (03/26/2021). Bertram Savin care team collaboration (see longitudinal plan of care). . Assessed needs and provided education on level of care and facility placement process. . Obtained patient's verbal permission to fax FL-2 Form to all long-term care assisted living facilities of interest, once received from Burnard Hawthorne, [place]. Marland Kitchen Reviewed and initiated process to obtain PASSAR #. . Assisted patient in understanding facility selection process. [facility] with identified facilities of interest. . Assist patient with faxing FL-2 Form to identified facilities of interest.   Patient Goals/Self-Care Activities: . Continue to review list of long-term care assisted living facilities and be prepared to provide LCSW with at least 2 additional facilities of interest, in the event that [place], [place], Materials engineer at the [place] and Public librarian at [place] do not have bed availability, during next scheduled telephone outreach call with LCSW. Minette Brine Admissions Department at [facility] to request review of FL-2 Form and check bed availability. Minette Brine Admissions Department at [facility] to request review of FL-2 Form and check bed availability. Minette Brine Admissions Department at [facility] at the [facility] to request review of FL-2 Form and check bed availability. Minette Brine Admissions Department at [facility] at [facility] to request review of FL-2 Form and check bed availability. . Await return calls from [place], [place], Materials engineer at  the [place] and Public librarian at [place] regarding bed availability/bed offers, as LCSW faxed FL-2 Form to all facilities of interest again on 04/13/2021.   Marland Kitchen Have husband contact his primary care provider to request assistance with long-term care placement arrangements into an assisted living facility. . Accept weekly telephone outreach calls from LCSW to receive assistance with long-term care assisted living placement. Follow Up Plan: Telephone follow up appointment with care management team member scheduled for:  04/27/2021 at 9:30am.       Follow Up Plan: 04/27/2021 at 9:30am.      Nat Christen LCSW Licensed Clinical Social Worker [place]  [phone]

## 2021-04-24 ENCOUNTER — Encounter: Payer: Self-pay | Admitting: Internal Medicine

## 2021-04-27 ENCOUNTER — Telehealth: Payer: Self-pay | Admitting: *Deleted

## 2021-04-27 ENCOUNTER — Telehealth: Payer: Medicare Other | Admitting: *Deleted

## 2021-04-27 NOTE — Telephone Encounter (Signed)
  Care Management   Follow Up Note   04/27/2021 Name: Melody Ochoa MRN: [id number] DOB: 1941/11/06   Referred by: Burnard Hawthorne, FNP Reason for referral : Chronic Care Management in Patient with Degeneration of Lumbar or Lumbosacral Intervertebral Disc, History of Lumbar Surgery, Status Post Total Replacement of Right Hip, Chronic Pain Syndrome, Primary Osteoarthritis of Left Hip and Frequent Falls. Unsuccessful Follow-Up Outreach Call Attempt.  The patient was referred to the case management team for assistance with care management and care coordination.  LCSW was unsuccessful in making contact with patient today to follow-up regarding long-term care placement arrangements into an assisted living facility.  LCSW was unable to leave a HIPAA compliant message on voicemail for patient, as voicemail was full.  LCSW also received an automated recording stating, "Welcome to [facility], Your [employer] Be Completed as Dialed, Please Check the Number and Try Your Call Again", when calling patient's cell phone number.  Several call attempts were made.  LCSW will make a second outreach attempt again next week, if a return call is not received from patient in the meantime.  The patient has been provided with contact information for the care management team and has been advised to call with any health related questions or concerns.    Follow-Up:  05/05/2021 at 2:00pm.  Nat Christen LCSW Licensed Clinical Social Worker [place]  [phone]

## 2021-05-05 ENCOUNTER — Telehealth: Payer: Self-pay | Admitting: *Deleted

## 2021-05-05 ENCOUNTER — Telehealth: Payer: Medicare Other | Admitting: *Deleted

## 2021-05-05 NOTE — Telephone Encounter (Signed)
  Care Management   Follow Up Note   05/05/2021 Name: Melody Ochoa MRN: [id number] DOB: June 15, 1941   Referred by: Burnard Hawthorne, FNP Reason for referral : Chronic Care Management in Patient with Degeneration of Lumbar or Lumbosacral Intervertebral Disc, History of Lumbar Surgery, Status Post Total Replacement of Right Hip, Chronic Pain Syndrome, Primary Osteoarthritis of Left Hip and Frequent Falls. 2nd Unsuccessful Follow-Up Outreach Call Attempt.  The patient was referred to the case management team for assistance with care management and care coordination.  LCSW was unsuccessful in making contact with patient today to follow-up regarding long-term care placement arrangements into an assisted living facility.  LCSW was unable to leave a HIPAA compliant message on voicemail for patient, as voicemail was full.  LCSW also received an automated recording stating, "Welcome to [facility], Your [employer] Be Completed as Dialed, Please Check the Number and Try Your Call Again", when calling patient's cell phone number.  LCSW will make a third outreach attempt again next week, if a return call is not received from patient in the meantime.  The patient has been provided with contact information for the care management team and has been advised to call with any health related questions or concerns.    Follow-Up:  05/13/2021 at [place] LCSW Licensed Clinical Social Worker [place]  [phone]

## 2021-05-07 ENCOUNTER — Telehealth: Payer: Self-pay | Admitting: *Deleted

## 2021-05-07 NOTE — Telephone Encounter (Signed)
Erroneous Encounter.  Nat Christen LCSW Licensed Clinical Social Worker [place]  [phone]

## 2021-05-11 ENCOUNTER — Other Ambulatory Visit: Payer: Self-pay | Admitting: Family

## 2021-05-11 ENCOUNTER — Other Ambulatory Visit: Payer: Self-pay | Admitting: Internal Medicine

## 2021-05-12 ENCOUNTER — Telehealth: Payer: Self-pay | Admitting: Student in an Organized Health Care Education/Training Program

## 2021-05-12 DIAGNOSIS — G8929 Other chronic pain: Secondary | ICD-10-CM

## 2021-05-12 DIAGNOSIS — M533 Sacrococcygeal disorders, not elsewhere classified: Secondary | ICD-10-CM

## 2021-05-12 NOTE — Telephone Encounter (Signed)
Pain is in the left hip, asking to have an injection for this hip pain.

## 2021-05-13 ENCOUNTER — Telehealth: Payer: Self-pay | Admitting: *Deleted

## 2021-05-13 ENCOUNTER — Telehealth: Payer: Medicare Other | Admitting: *Deleted

## 2021-05-13 DIAGNOSIS — J8410 Pulmonary fibrosis, unspecified: Secondary | ICD-10-CM | POA: Diagnosis not present

## 2021-05-13 NOTE — Telephone Encounter (Cosign Needed)
  Care Management   Follow Up Note   05/13/2021 Name: IRLENE CRUDUP MRN: [id number] DOB: 1941-08-31   Referred by: Burnard Hawthorne, FNP Reason for referral : Chronic Care Managementin Patient withDegeneration of Lumbar or Lumbosacral Intervertebral Disc, History of Lumbar Surgery, Status Post Total Replacement of Right Hip, Chronic Pain Syndrome, Primary Osteoarthritis of Left HipandFrequent Falls. 3rd Unsuccessful Follow-Up Outreach Call Attempt.  The patient was referred to the case management team for assistance with care management and care coordination.LCSW was unsuccessful in making contact with patient today to follow-up regarding long-term care placement arrangements into an assisted living facility. LCSW was unable to leave a HIPAA compliant message on voicemail for patient, as voicemail was full. The patient has been provided with contact information for the care management team and has been advised to call with any health related questions or concerns.  Please advise patient to contact LCSW if she reports being interested in resuming social work services.  No further outreach calls will be made to patient by LCSW.  Nat Christen LCSW Licensed Clinical Social Worker [place]  [phone]

## 2021-05-18 ENCOUNTER — Ambulatory Visit: Payer: Medicare Other | Admitting: Student in an Organized Health Care Education/Training Program

## 2021-05-26 DIAGNOSIS — R2689 Other abnormalities of gait and mobility: Secondary | ICD-10-CM | POA: Diagnosis not present

## 2021-05-26 DIAGNOSIS — Z8673 Personal history of transient ischemic attack (TIA), and cerebral infarction without residual deficits: Secondary | ICD-10-CM | POA: Diagnosis not present

## 2021-06-09 ENCOUNTER — Other Ambulatory Visit: Payer: Self-pay | Admitting: Family

## 2021-06-09 ENCOUNTER — Encounter: Payer: Self-pay | Admitting: Student in an Organized Health Care Education/Training Program

## 2021-06-09 NOTE — Telephone Encounter (Signed)
RX Refill:lyrica Last Seen:03-16-21 Last ordered:03-19-21

## 2021-06-10 ENCOUNTER — Ambulatory Visit
Payer: Medicare Other | Attending: Student in an Organized Health Care Education/Training Program | Admitting: Student in an Organized Health Care Education/Training Program

## 2021-06-10 ENCOUNTER — Other Ambulatory Visit: Payer: Self-pay

## 2021-06-10 DIAGNOSIS — M19012 Primary osteoarthritis, left shoulder: Secondary | ICD-10-CM | POA: Diagnosis not present

## 2021-06-10 DIAGNOSIS — M25512 Pain in left shoulder: Secondary | ICD-10-CM

## 2021-06-10 DIAGNOSIS — S2242XS Multiple fractures of ribs, left side, sequela: Secondary | ICD-10-CM | POA: Diagnosis not present

## 2021-06-10 DIAGNOSIS — M5416 Radiculopathy, lumbar region: Secondary | ICD-10-CM

## 2021-06-10 DIAGNOSIS — M533 Sacrococcygeal disorders, not elsewhere classified: Secondary | ICD-10-CM | POA: Diagnosis not present

## 2021-06-10 DIAGNOSIS — G8929 Other chronic pain: Secondary | ICD-10-CM | POA: Diagnosis not present

## 2021-06-10 MED ORDER — PREGABALIN 50 MG PO CAPS: ORAL_CAPSULE | ORAL | 1 refills | Status: DC

## 2021-06-10 MED ORDER — METHYLPREDNISOLONE 4 MG PO TBPK: ORAL_TABLET | ORAL | 0 refills | Status: AC

## 2021-06-10 NOTE — Progress Notes (Signed)
Patient: Melody Ochoa  Service Category: E/M  Provider: Gillis Santa, MD  DOB: [birth year]  DOS: 06/10/2021  Location: Office  MRN: [id number]  Setting: Ambulatory outpatient  Referring Provider: Burnard Hawthorne, FNP  Type: Established Patient  Specialty: Interventional Pain Management  PCP: Burnard Hawthorne, FNP  Location: Home  Delivery: TeleHealth     Virtual Encounter - Pain Management PROVIDER NOTE: Information contained herein reflects review and annotations entered in association with encounter. Interpretation of such information and data should be left to medically-trained personnel. Information provided to patient can be located elsewhere in the medical record under "Patient Instructions". Document created using STT-dictation technology, any transcriptional errors that may result from process are unintentional.    Contact & Pharmacy Preferred: [phone] Home: [phone] (home) Mobile: [phone] (mobile) E-mail: [email or web address]  [facility] - [place], [place] - [place] [address] Phone: [phone] Fax: [phone]   Pre-screening  Melody Ochoa offered "in-person" vs "virtual" encounter. She indicated preferring virtual for this encounter.   Reason COVID-19*  Social distancing based on CDC and AMA recommendations.   I contacted Melody Ochoa on 06/10/2021 via video conference.      I clearly identified myself as Gillis Santa, MD. I verified that I was speaking with the correct person using two identifiers (Name: ARISBETH PURRINGTON, and date of birth: 10-20-1941).  Consent I sought verbal advanced consent from Melody Ochoa for virtual visit interactions. I informed Melody Ochoa of possible security and privacy concerns, risks, and limitations associated with providing "not-in-person" medical evaluation and management services. I also informed Melody Ochoa of the availability of "in-person" appointments. Finally, I  informed her that there would be a charge for the virtual visit and that she could be  personally, fully or partially, financially responsible for it. Melody Ochoa expressed understanding and agreed to proceed.   Historic Elements   Melody Ochoa is a 80 y.o. year old, female patient evaluated today after our last contact on 05/18/2021. Melody Ochoa  has a past medical history of Arthritis, Atheromatous plaque (10/18/2017), Back pain, Cancer ([place]), Depression, Diverticulosis, Dizziness, GERD (gastroesophageal reflux disease), Heart murmur, Hyperlipidemia (10/18/2017), PONV (postoperative nausea and vomiting), Squamous cell carcinoma of skin (11/16/2016), Squamous cell carcinoma of skin (02/05/2020), and Stroke [facility]). She also  has a past surgical history that includes Tonsillectomy; Facelift (94); Cervical disc surgery (09); Back surgery (13); Colonoscopy with propofol (N/A, 01/13/2016); Total hip arthroplasty (Right, 04/22/2016); TEE without cardioversion (N/A, 08/12/2017); LOOP RECORDER INSERTION (N/A, 08/23/2017); Breast excisional biopsy (Left, 90's); Breast biopsy (Bilateral); Cataract extraction w/PHACO (Left, 12/20/2018); Cataract extraction w/PHACO (Right, 01/24/2019); and Colonoscopy with propofol (N/A, 02/19/2020). Melody Ochoa has a current medication list which includes the following prescription(s): albuterol, aspirin low dose, atorvastatin, bupropion, clopidogrel, duloxetine, famotidine, fluticasone, methylprednisolone, amlodipine, and pregabalin. She  reports that she quit smoking about 24 years ago. Her smoking use included cigarettes. She has a 30.00 pack-year smoking history. She has never used smokeless tobacco. She reports current alcohol use. She reports that she does not use drugs. Melody Ochoa is allergic to latex, aleve [naproxen sodium], aspirin, celebrex [celecoxib], effexor [venlafaxine], gabapentin, hydrocodone bit-homatrop mbr, ibuprofen, mobic [meloxicam], tape, vioxx [rofecoxib],  other, and tramadol.   HPI  Today, she is being contacted for worsening of previously known (established) problem  Patient having increased left leg pain, as well as low back pain. No inciting or traumatic event. Denies falls. States that it started last week  and now she is having trouble ambulating. She has a hx of chronic multiple CVA.  She is on Plavix for this and high risk to be off.  For this reason would like to avoid a lumbar epidural steroid injection for the time being.  Patient is not a diabetic.  She cannot recall when her last steroid taper was.  Recommend Medrol Dosepak as below for left lumbar radiculitis.  If her pain is not amenable to prednisone taper, recommend that she contact us to further discuss epidural steroid injection.  Patient endorsed understanding.  Pharmacotherapy Assessment  Analgesic: Oxycodone 5 mg daily as needed, takes very seldomly, 1 prescription usually last 3 to 4 months.     Monitoring: [place] PMP: PDMP reviewed during this encounter.       Pharmacotherapy: No side-effects or adverse reactions reported. Compliance: No problems identified. Effectiveness: Clinically acceptable. Plan: Refer to "POC".  UDS:  Summary  Date Value Ref Range Status  01/23/2020 Note  Final    Comment:    ==================================================================== [employer] Select 13 (MW) ==================================================================== Test                             Result       Flag       Units   NO DRUGS DETECTED. ==================================================================== Test                      Result    Flag   Units      Ref Range   Creatinine              219              mg/dL      >=20 ==================================================================== Declared Medications:  The flagging and interpretation on this report are based on the  following declared medications.  Unexpected results may arise from  inaccuracies in  the declared medications.  **Note: The testing scope of this panel does not include the  following reported medications:  Albuterol  Amlodipine  Atorvastatin  Bupropion  Clopidogrel  Duloxetine  Famotidine  Fluticasone  Pregabalin  Tiotropium  Triamcinolone ==================================================================== For clinical consultation, please call [phone]. ====================================================================     Laboratory Chemistry Profile   Renal Lab Results  Component Value Date   BUN 21 03/30/2021   CREATININE 1.33 (H) 03/30/2021   BCR 15 03/06/2021   GFR 37.69 (L) 03/30/2021   GFRAA 59 (L) 03/11/2020   GFRNONAA 54 (L) 12/20/2020    Hepatic Lab Results  Component Value Date   AST 16 03/06/2021   ALT 16 03/06/2021   ALBUMIN 3.5 12/20/2020   ALKPHOS 58 12/20/2020    Electrolytes Lab Results  Component Value Date   NA 142 03/30/2021   K 4.1 03/30/2021   CL 107 03/30/2021   CALCIUM 9.1 03/30/2021    Bone No results found for: VD25OH, VD125OH2TOT, OA4166AY3, KZ6010XN2, 25OHVITD1, 25OHVITD2, 25OHVITD3, TESTOFREE, TESTOSTERONE  Inflammation (CRP: Acute Phase) (ESR: Chronic Phase) No results found for: CRP, ESRSEDRATE, LATICACIDVEN        Note: Above Lab results reviewed.  Imaging  CUP PACEART REMOTE DEVICE CHECK ILR summary report received. Battery status OK. Normal device function. No new symptom, tachy, brady, or pause episodes. No new AF episodes. Monthly summary reports and ROV/PRN  Assessment  The primary encounter diagnosis was Lumbar radiculopathy. Diagnoses of Primary osteoarthritis of left shoulder, Closed fracture of multiple ribs of left side, sequela,  Chronic left shoulder pain, Chronic left SI joint pain, and SI (sacroiliac) joint dysfunction were also pertinent to this visit.  Plan of Care    Melody Ochoa has a current medication list which includes the following long-term medication(s):  albuterol, atorvastatin, bupropion, clopidogrel, duloxetine, famotidine, fluticasone, amlodipine, and pregabalin.  Pharmacotherapy (Medications Ordered): Meds ordered this encounter  Medications   pregabalin (LYRICA) 50 MG capsule    Sig: TAKE 1 CAPSULE (50 MG) BY MOUTH EVERY DAY AND TAKE 2 CAPSULES (100 MG) AT BEDTIME. Max daily dose 172m/day.    Dispense:  90 capsule    Refill:  1    FOR FUTURE REFILLS ON FILE PLS   methylPREDNISolone (MEDROL) 4 MG TBPK tablet    Sig: Follow package instructions.    Dispense:  21 tablet    Refill:  0    Do not add to the "Automatic Refill" notification system.    Follow-up plan:   Return if symptoms worsen or fail to improve.     Status post left shoulder intra-articular injection, posterior approach on 08/08/2019, 09/17/2019- helped, repeat PRN.   Left sacroiliac joint injection 09/24/2020            Recent Visits Date Type Provider Dept  04/16/21 Office Visit LGillis Santa MD [facility]  Showing recent visits within past 90 days and meeting all other requirements Today's Visits Date Type Provider Dept  06/10/21 Telemedicine LGillis Santa MD [facility]  Showing today's visits and meeting all other requirements Future Appointments No visits were found meeting these conditions. Showing future appointments within next 90 days and meeting all other requirements I discussed the assessment and treatment plan with the patient. The patient was provided an opportunity to ask questions and all were answered. The patient agreed with the plan and demonstrated an understanding of the instructions.  Patient advised to call back or seek an in-person evaluation if the symptoms or condition worsens.  Duration of encounter: 251mutes.  Note by: BiGillis SantaMD Date: 06/10/2021; Time: 3:15 PM

## 2021-06-10 NOTE — Telephone Encounter (Signed)
Call pt  Based on renal function ( Crtcl 53), max daily dose is 150mg / day   Please ensure she doesn't feel excessive sedated on this medication or feel that is contributes to falls  I can prescribe lyrica however she will need to be seen every 3 months  Sch appt please

## 2021-06-10 NOTE — Telephone Encounter (Signed)
Pt requested to be seen sooner. She is scheduled 7/22 at 4p.

## 2021-06-10 NOTE — Telephone Encounter (Signed)
Pt will ask if Dr. Holley Raring will be resuming this prescription bc she isn't sure since she started seeing him again. She will let us know. She is scheduled with you though 8/8 at 3p. She is having no issues or sedation with Lyrica.

## 2021-06-12 DIAGNOSIS — J8410 Pulmonary fibrosis, unspecified: Secondary | ICD-10-CM | POA: Diagnosis not present

## 2021-06-22 ENCOUNTER — Telehealth: Payer: Self-pay

## 2021-06-22 ENCOUNTER — Telehealth: Payer: Self-pay | Admitting: Student in an Organized Health Care Education/Training Program

## 2021-06-22 NOTE — Telephone Encounter (Signed)
Tried to return call. No answer and unable to leave VM.

## 2021-06-22 NOTE — Telephone Encounter (Signed)
Attempted to call patient for pre virtual appointment questions.  Unable to leave a message because the mailbox hasnt been set up yet.

## 2021-06-23 ENCOUNTER — Other Ambulatory Visit: Payer: Self-pay

## 2021-06-23 ENCOUNTER — Telehealth: Payer: Self-pay

## 2021-06-23 ENCOUNTER — Ambulatory Visit
Payer: Medicare Other | Attending: Student in an Organized Health Care Education/Training Program | Admitting: Student in an Organized Health Care Education/Training Program

## 2021-06-23 DIAGNOSIS — M5416 Radiculopathy, lumbar region: Secondary | ICD-10-CM

## 2021-06-23 NOTE — Telephone Encounter (Signed)
Attempted to call patient.  Mailbox full.  Did not leave a message on home phone because her name was not mentioned on the message.

## 2021-06-23 NOTE — Progress Notes (Signed)
I attempted to call the patient however no response. No VM option -Dr Holley Raring

## 2021-06-24 ENCOUNTER — Encounter: Payer: Self-pay | Admitting: Student in an Organized Health Care Education/Training Program

## 2021-06-24 ENCOUNTER — Other Ambulatory Visit: Payer: Self-pay

## 2021-06-24 ENCOUNTER — Ambulatory Visit
Payer: Medicare Other | Attending: Student in an Organized Health Care Education/Training Program | Admitting: Student in an Organized Health Care Education/Training Program

## 2021-06-24 DIAGNOSIS — G588 Other specified mononeuropathies: Secondary | ICD-10-CM

## 2021-06-24 DIAGNOSIS — S2242XS Multiple fractures of ribs, left side, sequela: Secondary | ICD-10-CM

## 2021-06-24 DIAGNOSIS — M533 Sacrococcygeal disorders, not elsewhere classified: Secondary | ICD-10-CM | POA: Diagnosis not present

## 2021-06-24 DIAGNOSIS — M5416 Radiculopathy, lumbar region: Secondary | ICD-10-CM

## 2021-06-24 DIAGNOSIS — M19012 Primary osteoarthritis, left shoulder: Secondary | ICD-10-CM | POA: Diagnosis not present

## 2021-06-24 DIAGNOSIS — G894 Chronic pain syndrome: Secondary | ICD-10-CM

## 2021-06-24 MED ORDER — PREDNISONE 20 MG PO TABS: ORAL_TABLET | ORAL | 0 refills | Status: AC

## 2021-06-24 NOTE — Progress Notes (Signed)
Patient: Melody Ochoa  Service Category: E/M  Provider: Gillis Santa, MD  DOB: 25-Jul-1941  DOS: 06/24/2021  Location: Office  MRN: [id number]  Setting: Ambulatory outpatient  Referring Provider: Burnard Hawthorne, FNP  Type: Established Patient  Specialty: Interventional Pain Management  PCP: Burnard Hawthorne, FNP  Location: Home  Delivery: TeleHealth     Virtual Encounter - Pain Management PROVIDER NOTE: Information contained herein reflects review and annotations entered in association with encounter. Interpretation of such information and data should be left to medically-trained personnel. Information provided to patient can be located elsewhere in the medical record under "Patient Instructions". Document created using STT-dictation technology, any transcriptional errors that may result from process are unintentional.    Contact & Pharmacy Preferred: [phone] Home: [phone] (home) Mobile: [phone] (mobile) E-mail: [email or web address]  [facility] - [place], [place] - [address] [place] [address] Phone: [phone] Fax: [phone]   Pre-screening  Ms. Seamans offered "in-person" vs "virtual" encounter. She indicated preferring virtual for this encounter.   Reason COVID-19*  Social distancing based on CDC and AMA recommendations.   I contacted THERESSA PIEDRA on 06/24/2021 via telephone.      I clearly identified myself as Gillis Santa, MD. I verified that I was speaking with the correct person using two identifiers (Name: YUMIKO ALKINS, and date of birth: 02-10-1941).  Consent I sought verbal advanced consent from [name] for virtual visit interactions. I informed Ms. Lebron of possible security and privacy concerns, risks, and limitations associated with providing "not-in-person" medical evaluation and management services. I also informed Ms. Stern of the availability of "in-person" appointments. Finally, I informed  her that there would be a charge for the virtual visit and that she could be  personally, fully or partially, financially responsible for it. Ms. Scriven expressed understanding and agreed to proceed.   Historic Elements   Melody Ochoa is a 80 y.o. year old, female patient evaluated today after our last contact on 06/22/2021. Ms. Perren  has a past medical history of Arthritis, Atheromatous plaque (10/18/2017), Back pain, Cancer ([place]), Depression, Diverticulosis, Dizziness, GERD (gastroesophageal reflux disease), Heart murmur, Hyperlipidemia (10/18/2017), PONV (postoperative nausea and vomiting), Squamous cell carcinoma of skin (11/16/2016), Squamous cell carcinoma of skin (02/05/2020), and Stroke [facility]). She also  has a past surgical history that includes Tonsillectomy; Facelift (94); Cervical disc surgery (09); Back surgery (13); Colonoscopy with propofol (N/A, 01/13/2016); Total hip arthroplasty (Right, 04/22/2016); TEE without cardioversion (N/A, 08/12/2017); LOOP RECORDER INSERTION (N/A, 08/23/2017); Breast excisional biopsy (Left, 90's); Breast biopsy (Bilateral); Cataract extraction w/PHACO (Left, 12/20/2018); Cataract extraction w/PHACO (Right, 01/24/2019); and Colonoscopy with propofol (N/A, 02/19/2020). Melody Ochoa has a current medication list which includes the following prescription(s): albuterol, amlodipine, aspirin low dose, atorvastatin, bupropion, clopidogrel, duloxetine, famotidine, prednisone, pregabalin, and fluticasone. She  reports that she quit smoking about 24 years ago. Her smoking use included cigarettes. She has a 30.00 pack-year smoking history. She has never used smokeless tobacco. She reports current alcohol use. She reports that she does not use drugs. Melody Ochoa is allergic to latex, aleve [naproxen sodium], aspirin, celebrex [celecoxib], effexor [venlafaxine], gabapentin, hydrocodone bit-homatrop mbr, ibuprofen, mobic [meloxicam], tape, vioxx [rofecoxib], other, and tramadol.    HPI  Today, she is being contacted for worsening of previously known (established) problem  Patient presents virtually with increased low back pain as well as right-sided hip pain.  She states that she has sustained a fall earlier in the  week and has contributed to increased right buttock and right hip pain.  She is having trouble walking.  She is requesting a steroid taper for acute on chronic pain.  Denies any steroid intake over the last 3 months.  I will send in a prescription for prednisone taper.  If her pain worsens or if she develops worsening weakness, have instructed her to come in face-to-face for evaluation.   Laboratory Chemistry Profile   Renal Lab Results  Component Value Date   BUN 21 03/30/2021   CREATININE 1.33 (H) 03/30/2021   BCR 15 03/06/2021   GFR 37.69 (L) 03/30/2021   GFRAA 59 (L) 03/11/2020   GFRNONAA 54 (L) 12/20/2020    Hepatic Lab Results  Component Value Date   AST 16 03/06/2021   ALT 16 03/06/2021   ALBUMIN 3.5 12/20/2020   ALKPHOS 58 12/20/2020    Electrolytes Lab Results  Component Value Date   NA 142 03/30/2021   K 4.1 03/30/2021   CL 107 03/30/2021   CALCIUM 9.1 03/30/2021    Bone No results found for: VD25OH, VD125OH2TOT, HU7654YT0, PT4656CL2, 25OHVITD1, 25OHVITD2, 25OHVITD3, TESTOFREE, TESTOSTERONE  Inflammation (CRP: Acute Phase) (ESR: Chronic Phase) No results found for: CRP, ESRSEDRATE, LATICACIDVEN       Note: Above Lab results reviewed.  Imaging  CUP PACEART REMOTE DEVICE CHECK ILR summary report received. Battery status OK. Normal device function. No new symptom, tachy, brady, or pause episodes. No new AF episodes. Monthly summary reports and ROV/PRN  Assessment  The primary encounter diagnosis was Lumbar radiculopathy. Diagnoses of Primary osteoarthritis of left shoulder, Closed fracture of multiple ribs of left side, sequela, SI (sacroiliac) joint dysfunction, Intercostal neuralgia, and Chronic pain syndrome were also  pertinent to this visit.  Plan of Care   Ms. IASHA MCCALISTER has a current medication list which includes the following long-term medication(s): albuterol, amlodipine, atorvastatin, bupropion, clopidogrel, duloxetine, famotidine, pregabalin, and fluticasone.  Pharmacotherapy (Medications Ordered): Meds ordered this encounter  Medications   predniSONE (DELTASONE) 20 MG tablet    Sig: Take 3 tablets (60 mg total) by mouth daily with breakfast for 3 days, THEN 2 tablets (40 mg total) daily with breakfast for 3 days, THEN 1 tablet (20 mg total) daily with breakfast for 3 days.    Dispense:  18 tablet    Refill:  0    Follow-up plan:   Return if symptoms worsen or fail to improve.     Status post left shoulder intra-articular injection, posterior approach on 08/08/2019, 09/17/2019- helped, repeat PRN.   Left sacroiliac joint injection 09/24/2020             Recent Visits Date Type Provider Dept  06/23/21 Telemedicine Gillis Santa, MD [facility]  06/10/21 Telemedicine Gillis Santa, MD [facility]  04/16/21 Office Visit Gillis Santa, MD [facility]  Showing recent visits within past 90 days and meeting all other requirements Today's Visits Date Type Provider Dept  06/24/21 Telemedicine Gillis Santa, MD [facility]  Showing today's visits and meeting all other requirements Future Appointments No visits were found meeting these conditions. Showing future appointments within next 90 days and meeting all other requirements I discussed the assessment and treatment plan with the patient. The patient was provided an opportunity to ask questions and all were answered. The patient agreed with the plan and demonstrated an understanding of the instructions.  Patient advised to call back or seek an in-person evaluation if the symptoms or condition worsens.  Duration of encounter: 51minutes.  Note by: Gillis Santa, MD Date: 06/24/2021; Time: 1:30 PM

## 2021-06-26 ENCOUNTER — Ambulatory Visit: Payer: Medicare Other | Admitting: Family

## 2021-06-26 NOTE — Progress Notes (Deleted)
Subjective:    Patient ID: Melody Ochoa, female    DOB: [birth year], 80 y.o.   MRN: [id number]  CC: Melody Ochoa is a 80 y.o. female who presents today for follow up.   HPI: Prednisone from Dr Holley Raring   HISTORY:  Past Medical History:  Diagnosis Date   Arthritis    Atheromatous plaque 10/18/2017   Back pain    Cancer ([place])    skin   Depression    Diverticulosis    Dizziness    patient had episode of dizziness when came in room. hx past no dx   GERD (gastroesophageal reflux disease)    Heart murmur    Hyperlipidemia 10/18/2017   PONV (postoperative nausea and vomiting)    yrs ago   Squamous cell carcinoma of skin 11/16/2016   L chest parasternal   Squamous cell carcinoma of skin 02/05/2020   L cheek inf to lat zygoma   Stroke [facility])    Past Surgical History:  Procedure Laterality Date   BACK SURGERY  13   BREAST BIOPSY Bilateral    cores "years ago"   BREAST EXCISIONAL BIOPSY Left 90's   CATARACT EXTRACTION W/PHACO Left 12/20/2018   Procedure: CATARACT EXTRACTION PHACO AND INTRAOCULAR LENS PLACEMENT ([place]) LEFT TOPICAL;  Surgeon: Leandrew Koyanagi, MD;  Location: [place];  Service: Ophthalmology;  Laterality: Left;   CATARACT EXTRACTION W/PHACO Right 01/24/2019   Procedure: CATARACT EXTRACTION PHACO AND INTRAOCULAR LENS PLACEMENT ([place]) RIGHT;  Surgeon: Leandrew Koyanagi, MD;  Location: [place];  Service: Ophthalmology;  Laterality: Right;   CERVICAL DISC SURGERY  09   COLONOSCOPY WITH PROPOFOL N/A 01/13/2016   Procedure: COLONOSCOPY WITH PROPOFOL;  Surgeon: [name], MD;  Location: [facility] ENDOSCOPY;  Service: Endoscopy;  Laterality: N/A;   COLONOSCOPY WITH PROPOFOL N/A 02/19/2020   Procedure: COLONOSCOPY WITH PROPOFOL;  Surgeon: [name], MD;  Location: [facility] ENDOSCOPY;  Service: Gastroenterology;  Laterality: N/A;   FACELIFT  94   LOOP RECORDER INSERTION N/A 08/23/2017   Procedure: LOOP RECORDER INSERTION;  Surgeon: Thompson Grayer, MD;   Location: [place] CV LAB;  Service: Cardiovascular;  Laterality: N/A;   TEE WITHOUT CARDIOVERSION N/A 08/12/2017   Procedure: TRANSESOPHAGEAL ECHOCARDIOGRAM (TEE);  Surgeon: Sueanne Margarita, MD;  Location: [facility] ENDOSCOPY;  Service: Cardiovascular;  Laterality: N/A;   TONSILLECTOMY     TOTAL HIP ARTHROPLASTY Right 04/22/2016   Procedure: TOTAL HIP ARTHROPLASTY;  Surgeon: Corky Mull, MD;  Location: [facility] ORS;  Service: Orthopedics;  Laterality: Right;   Family History  Problem Relation Age of Onset   Lung cancer Father    Aneurysm Brother    Stroke Brother    Diabetes Maternal Grandmother    Kidney disease Maternal Grandfather    Cushing syndrome Paternal Grandmother    Dementia Paternal Grandfather    Breast cancer Maternal Aunt 52    Allergies: Latex, Aleve [naproxen sodium], Aspirin, Celebrex [celecoxib], Effexor [venlafaxine], Gabapentin, Hydrocodone bit-homatrop mbr, Ibuprofen, Mobic [meloxicam], Tape, Vioxx [rofecoxib], Other, and Tramadol Current Outpatient Medications on File Prior to Visit  Medication Sig Dispense Refill   albuterol (VENTOLIN HFA) 108 (90 Base) MCG/ACT inhaler TAKE 2 PUFFS BY MOUTH EVERY 6 HOURS AS NEEDED FOR WHEEZE OR SHORTNESS OF BREATH 8.5 each 2   amLODipine (NORVASC) 5 MG tablet TAKE 1 TABLET BY MOUTH DAILY 30 tablet 0   ASPIRIN LOW DOSE 81 MG EC tablet TAKE 1 TABLET (81 MG TOTAL) BY MOUTH DAILY FOR 21 DAYS. SWALLOW WHOLE. 21 tablet  0   atorvastatin (LIPITOR) 80 MG tablet Take 1 tablet (80 mg total) by mouth daily. 90 tablet 0   buPROPion (WELLBUTRIN XL) 300 MG 24 hr tablet TAKE ONE TABLET BY MOUTH EVERY DAY 90 tablet 1   clopidogrel (PLAVIX) 75 MG tablet Take 1 tablet (75 mg total) by mouth daily. 90 tablet 3   DULoxetine (CYMBALTA) 30 MG capsule TAKE 1 CAPSULE BY MOUTH EVERY DAY 90 capsule 1   famotidine (PEPCID) 20 MG tablet TAKE ONE TABLET BY MOUTH AT BEDTIME 90 tablet 1   fluticasone (FLONASE) 50 MCG/ACT nasal spray SPRAY 2 SPRAYS INTO EACH NOSTRIL  EVERY DAY (Patient not taking: Reported on 06/24/2021) 48 mL 1   predniSONE (DELTASONE) 20 MG tablet Take 3 tablets (60 mg total) by mouth daily with breakfast for 3 days, THEN 2 tablets (40 mg total) daily with breakfast for 3 days, THEN 1 tablet (20 mg total) daily with breakfast for 3 days. 18 tablet 0   pregabalin (LYRICA) 50 MG capsule TAKE 1 CAPSULE (50 MG) BY MOUTH EVERY DAY AND TAKE 2 CAPSULES (100 MG) AT BEDTIME. Max daily dose '150mg'$ /day. 90 capsule 1   No current facility-administered medications on file prior to visit.    Social History   Tobacco Use   Smoking status: Former    Packs/day: 0.75    Years: 40.00    Pack years: 30.00    Types: Cigarettes    Quit date: 11/13/1996    Years since quitting: 24.6   Smokeless tobacco: Never  Vaping Use   Vaping Use: Never used  Substance Use Topics   Alcohol use: Yes    Comment: occ wine   Drug use: No    Review of Systems    Objective:    There were no vitals taken for this visit. BP Readings from Last 3 Encounters:  04/16/21 129/81  03/06/21 122/68  12/24/20 102/61   Wt Readings from Last 3 Encounters:  04/16/21 180 lb (81.6 kg)  03/06/21 189 lb 6.4 oz (85.9 kg)  12/24/20 200 lb (90.7 kg)    Physical Exam     Assessment & Plan:   Problem List Items Addressed This Visit   None    I am having Melody Ochoa maintain her fluticasone, clopidogrel, atorvastatin, albuterol, Aspirin Low Dose, DULoxetine, buPROPion, famotidine, amLODipine, pregabalin, and predniSONE.   No orders of the defined types were placed in this encounter.   Return precautions given.   Risks, benefits, and alternatives of the medications and treatment plan prescribed today were discussed, and patient expressed understanding.   Education regarding symptom management and diagnosis given to patient on AVS.  Continue to follow with Burnard Hawthorne, FNP for routine health maintenance.   Melody Ochoa and I agreed with plan.    [name], FNP

## 2021-06-28 ENCOUNTER — Emergency Department
Admission: EM | Admit: 2021-06-28 | Discharge: 2021-06-28 | Disposition: A | Discharge status: Home / Self Care | Payer: Medicare Other | Attending: Emergency Medicine | Admitting: Emergency Medicine

## 2021-06-28 ENCOUNTER — Other Ambulatory Visit: Payer: Self-pay

## 2021-06-28 DIAGNOSIS — S0083XA Contusion of other part of head, initial encounter: Secondary | ICD-10-CM | POA: Insufficient documentation

## 2021-06-28 DIAGNOSIS — E162 Hypoglycemia, unspecified: Secondary | ICD-10-CM | POA: Diagnosis not present

## 2021-06-28 DIAGNOSIS — Z743 Need for continuous supervision: Secondary | ICD-10-CM | POA: Diagnosis not present

## 2021-06-28 DIAGNOSIS — E161 Other hypoglycemia: Secondary | ICD-10-CM | POA: Diagnosis not present

## 2021-06-28 DIAGNOSIS — Z5321 Procedure and treatment not carried out due to patient leaving prior to being seen by health care provider: Secondary | ICD-10-CM | POA: Diagnosis not present

## 2021-06-28 DIAGNOSIS — R0902 Hypoxemia: Secondary | ICD-10-CM | POA: Diagnosis not present

## 2021-06-28 DIAGNOSIS — W19XXXA Unspecified fall, initial encounter: Secondary | ICD-10-CM | POA: Diagnosis not present

## 2021-06-28 DIAGNOSIS — M79605 Pain in left leg: Secondary | ICD-10-CM | POA: Diagnosis not present

## 2021-06-28 LAB — COMPREHENSIVE METABOLIC PANEL
ALT: 19 U/L (ref 0–44)
AST: 32 U/L (ref 15–41)
Albumin: 3.5 g/dL (ref 3.5–5.0)
Alkaline Phosphatase: 62 U/L (ref 38–126)
Anion gap: 10 (ref 5–15)
BUN: 19 mg/dL (ref 8–23)
CO2: 24 mmol/L (ref 22–32)
Calcium: 8.7 mg/dL — ABNORMAL LOW (ref 8.9–10.3)
Chloride: 105 mmol/L (ref 98–111)
Creatinine, Ser: 1.01 mg/dL — ABNORMAL HIGH (ref 0.44–1.00)
GFR, Estimated: 56 mL/min — ABNORMAL LOW (ref >60)
Glucose, Bld: 94 mg/dL (ref 70–99)
Potassium: 3.1 mmol/L — ABNORMAL LOW (ref 3.5–5.1)
Sodium: 139 mmol/L (ref 135–145)
Total Bilirubin: 1.2 mg/dL (ref 0.3–1.2)
Total Protein: 7.5 g/dL (ref 6.5–8.1)

## 2021-06-28 LAB — CBC
HCT: 33.7 % — ABNORMAL LOW (ref 36.0–46.0)
Hemoglobin: 11.1 g/dL — ABNORMAL LOW (ref 12.0–15.0)
MCH: 28.5 pg (ref 26.0–34.0)
MCHC: 32.9 g/dL (ref 30.0–36.0)
MCV: 86.6 fL (ref 80.0–100.0)
Platelets: 260 10*3/uL (ref 150–400)
RBC: 3.89 MIL/uL (ref 3.87–5.11)
RDW: 14.6 % (ref 11.5–15.5)
WBC: 10.6 10*3/uL — ABNORMAL HIGH (ref 4.0–10.5)
nRBC: 0 % (ref 0.0–0.2)

## 2021-06-28 NOTE — ED Triage Notes (Signed)
EMS reports pt and husband are very demented and EMS will be reaching out to adult protective services for evaluation

## 2021-06-28 NOTE — ED Triage Notes (Signed)
Pt neighbor Lawrence Santiago [phone]  Can prove information

## 2021-06-28 NOTE — ED Triage Notes (Signed)
Pt states she is having L leg pain- pt states it has been hurting for a a few days now- pt states she has had hip surgery on her R hip already- pt states that the pain in her leg caused her to fall yesterday- bruise noted to R side of forehead

## 2021-06-28 NOTE — ED Notes (Signed)
Pt husband here to pick her up. Pt encouraged to stay. Pt reports she wants to go home and is leaving. MD aware.

## 2021-06-28 NOTE — ED Triage Notes (Signed)
Pt in via EMS from home with c/o fall last pm. Pt with pain to left leg and was not able to get up. Pt with hx of hip surgery on right side as well. Pt does recall how or when she fell.

## 2021-06-29 ENCOUNTER — Telehealth: Payer: Self-pay

## 2021-06-29 ENCOUNTER — Emergency Department
Admission: EM | Admit: 2021-06-29 | Discharge: 2021-06-29 | Disposition: A | Discharge status: Home / Self Care | Payer: Medicare Other | Attending: Emergency Medicine | Admitting: Emergency Medicine

## 2021-06-29 ENCOUNTER — Other Ambulatory Visit: Payer: Self-pay

## 2021-06-29 ENCOUNTER — Emergency Department: Payer: Medicare Other

## 2021-06-29 DIAGNOSIS — W19XXXA Unspecified fall, initial encounter: Secondary | ICD-10-CM

## 2021-06-29 DIAGNOSIS — I129 Hypertensive chronic kidney disease with stage 1 through stage 4 chronic kidney disease, or unspecified chronic kidney disease: Secondary | ICD-10-CM | POA: Diagnosis not present

## 2021-06-29 DIAGNOSIS — Z743 Need for continuous supervision: Secondary | ICD-10-CM | POA: Diagnosis not present

## 2021-06-29 DIAGNOSIS — Z85820 Personal history of malignant melanoma of skin: Secondary | ICD-10-CM | POA: Diagnosis not present

## 2021-06-29 DIAGNOSIS — Z91040 Latex allergy status: Secondary | ICD-10-CM | POA: Insufficient documentation

## 2021-06-29 DIAGNOSIS — Z7902 Long term (current) use of antithrombotics/antiplatelets: Secondary | ICD-10-CM | POA: Insufficient documentation

## 2021-06-29 DIAGNOSIS — Z20822 Contact with and (suspected) exposure to covid-19: Secondary | ICD-10-CM | POA: Insufficient documentation

## 2021-06-29 DIAGNOSIS — S0083XA Contusion of other part of head, initial encounter: Secondary | ICD-10-CM | POA: Diagnosis not present

## 2021-06-29 DIAGNOSIS — W1830XA Fall on same level, unspecified, initial encounter: Secondary | ICD-10-CM | POA: Diagnosis not present

## 2021-06-29 DIAGNOSIS — Z96641 Presence of right artificial hip joint: Secondary | ICD-10-CM | POA: Insufficient documentation

## 2021-06-29 DIAGNOSIS — N1831 Chronic kidney disease, stage 3a: Secondary | ICD-10-CM | POA: Diagnosis not present

## 2021-06-29 DIAGNOSIS — Z7982 Long term (current) use of aspirin: Secondary | ICD-10-CM | POA: Insufficient documentation

## 2021-06-29 DIAGNOSIS — Z87891 Personal history of nicotine dependence: Secondary | ICD-10-CM | POA: Diagnosis not present

## 2021-06-29 DIAGNOSIS — S0990XA Unspecified injury of head, initial encounter: Secondary | ICD-10-CM | POA: Diagnosis not present

## 2021-06-29 DIAGNOSIS — Z79899 Other long term (current) drug therapy: Secondary | ICD-10-CM | POA: Insufficient documentation

## 2021-06-29 DIAGNOSIS — R0902 Hypoxemia: Secondary | ICD-10-CM | POA: Diagnosis not present

## 2021-06-29 DIAGNOSIS — S0003XA Contusion of scalp, initial encounter: Secondary | ICD-10-CM | POA: Diagnosis not present

## 2021-06-29 LAB — CBC
HCT: 31.4 % — ABNORMAL LOW (ref 36.0–46.0)
Hemoglobin: 10.4 g/dL — ABNORMAL LOW (ref 12.0–15.0)
MCH: 28.7 pg (ref 26.0–34.0)
MCHC: 33.1 g/dL (ref 30.0–36.0)
MCV: 86.5 fL (ref 80.0–100.0)
Platelets: 245 10*3/uL (ref 150–400)
RBC: 3.63 MIL/uL — ABNORMAL LOW (ref 3.87–5.11)
RDW: 14.6 % (ref 11.5–15.5)
WBC: 8.3 10*3/uL (ref 4.0–10.5)
nRBC: 0 % (ref 0.0–0.2)

## 2021-06-29 LAB — URINALYSIS, COMPLETE (UACMP) WITH MICROSCOPIC
Bilirubin Urine: NEGATIVE
Glucose, UA: NEGATIVE mg/dL
Hgb urine dipstick: NEGATIVE
Ketones, ur: 20 mg/dL — AB
Leukocytes,Ua: NEGATIVE
Nitrite: NEGATIVE
Protein, ur: 30 mg/dL — AB
Specific Gravity, Urine: 1.029 (ref 1.005–1.030)
pH: 5 (ref 5.0–8.0)

## 2021-06-29 LAB — BASIC METABOLIC PANEL
Anion gap: 11 (ref 5–15)
BUN: 23 mg/dL (ref 8–23)
CO2: 24 mmol/L (ref 22–32)
Calcium: 8.8 mg/dL — ABNORMAL LOW (ref 8.9–10.3)
Chloride: 104 mmol/L (ref 98–111)
Creatinine, Ser: 1 mg/dL (ref 0.44–1.00)
GFR, Estimated: 57 mL/min — ABNORMAL LOW (ref >60)
Glucose, Bld: 90 mg/dL (ref 70–99)
Potassium: 3.3 mmol/L — ABNORMAL LOW (ref 3.5–5.1)
Sodium: 139 mmol/L (ref 135–145)

## 2021-06-29 LAB — RESP PANEL BY RT-PCR (FLU A&B, COVID) ARPGX2
Influenza A by PCR: NEGATIVE
Influenza B by PCR: NEGATIVE
SARS Coronavirus 2 by RT PCR: NEGATIVE

## 2021-06-29 NOTE — Telephone Encounter (Signed)
Noted In ed , dced home

## 2021-06-29 NOTE — ED Triage Notes (Signed)
Pt here with a fall today. Pt states that she has been falling a lot recently. Pt is on a blood thinner.

## 2021-06-29 NOTE — ED Notes (Signed)
See triage note  Presents s/p fall  EMS states she was found on the floor  But has had several falls freq   Has several bruises noted at different stages of healing

## 2021-06-29 NOTE — Telephone Encounter (Signed)
Patient currently in ED FYI.

## 2021-06-29 NOTE — ED Triage Notes (Addendum)
Pt comes into the ED via EMS from home with c/o multiple falls over the past 2 weeks, found on the floor this morning after falling last night, pt is on eliquis  149/67 CBG105 95% 3L [place], on home O2 all the time

## 2021-06-29 NOTE — ED Provider Notes (Signed)
[facility] Emergency Department Provider Note  Time seen: 4:33 PM  I have reviewed the triage vital signs and the nursing notes.   HISTORY  Chief Complaint Fall   HPI Melody Ochoa is a 80 y.o. female with a past medical history of arthritis, depression, gastric reflux, prior CVA, CKD, presents to the emergency department for multiple falls.  According to the patient patient states multiple falls over the past several weeks which has increased over baseline although the patient states she does frequently have falls at baseline.  Patient is awake alert, no distress.  Patient does take blood thinners (Eliquis).  Patient admits to hitting her head with a fall last night, and again states she fell today.  Patient denies any headache, does not believe she lost consciousness.  Largely negative review of systems otherwise.   Past Medical History:  Diagnosis Date   Arthritis    Atheromatous plaque 10/18/2017   Back pain    Cancer ([place])    skin   Depression    Diverticulosis    Dizziness    patient had episode of dizziness when came in room. 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   Multiple closed fractures of ribs of left side 04/16/2021   Adenoma of left adrenal gland 12/25/2020   Ischemic stroke ([place]) 12/10/2020   Chronic kidney disease, stage 3a ([place]) 12/09/2020   Chronic respiratory failure with hypoxia ([place]) 12/09/2020   Fall at home, initial encounter 12/09/2020   Right shoulder pain 12/09/2020   Memory deficit 09/24/2020   Pulmonary fibrosis ([place]) 09/14/2020   Chronic cough 09/14/2020    Arthritis of left sacroiliac joint 08/26/2020   Chronic left SI joint pain 08/26/2020   CKD (chronic kidney disease) 07/23/2020   HTN (hypertension) 04/23/2020   Positive fecal immunochemical test    Angiodysplasia of intestinal tract    Transient ischemic attack (TIA) 10/08/2019   Transient right leg weakness 09/30/2019   Peripheral neuropathy 08/10/2019   Primary osteoarthritis of left shoulder 07/24/2019   Elevated blood pressure reading 06/27/2019   Left shoulder pain 05/04/2019   Anemia 03/19/2019   Long term current use of opiate analgesic 01/09/2019   Foraminal stenosis of lumbar region 11/14/2018   Chronic pain syndrome 09/12/2018   SOB (shortness of breath) on exertion 08/11/2018   Dysphagia 08/11/2018   Cough 06/30/2018   Rash 01/16/2018   Hyperlipidemia 10/18/2017   History of lumbar surgery 10/04/2017   Depression, recurrent ([place]) 09/27/2017   Pulmonary nodules 09/27/2017   History of CVA (cerebrovascular accident) 09/12/2017   Cerebellar stroke, acute ([place]) 08/10/2017   Primary osteoarthritis of left hip 02/28/2017   Strain of left hip 02/28/2017   B12 deficiency 12/21/2016   Generalized OA 05/13/2016   Status post total replacement of right hip 04/22/2016   Personal history of colonic polyps    Greater trochanteric bursitis of both hips 01/09/2016   Prediabetes 08/04/2015   Thoracic aortic atherosclerosis ([place]) 06/16/2015   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   UTI (lower urinary tract infection) 11/22/2014  Low back pain 11/22/2014   Spondylolisthesis of lumbar region 11/18/2014   Lumbar radiculopathy 08/21/2014   Degeneration of lumbar or lumbosacral intervertebral disc 08/21/2014   Left hip pain 08/21/2014   Osteopenia 06/23/2014   Major depression in remission ([place]) 06/23/2014   Anxiety 06/23/2014   Menopause 06/23/2014    Past Surgical History:  Procedure Laterality Date   BACK SURGERY  13   BREAST BIOPSY Bilateral    cores "years ago"    BREAST EXCISIONAL BIOPSY Left 90's   CATARACT EXTRACTION W/PHACO Left 12/20/2018   Procedure: CATARACT EXTRACTION PHACO AND INTRAOCULAR LENS PLACEMENT ([place]) LEFT TOPICAL;  Surgeon: Leandrew Koyanagi, MD;  Location: [place];  Service: Ophthalmology;  Laterality: Left;   CATARACT EXTRACTION W/PHACO Right 01/24/2019   Procedure: CATARACT EXTRACTION PHACO AND INTRAOCULAR LENS PLACEMENT ([place]) RIGHT;  Surgeon: Leandrew Koyanagi, MD;  Location: [place];  Service: Ophthalmology;  Laterality: Right;   CERVICAL DISC SURGERY  09   COLONOSCOPY WITH PROPOFOL N/A 01/13/2016   Procedure: COLONOSCOPY WITH PROPOFOL;  Surgeon: [name], MD;  Location: [facility] ENDOSCOPY;  Service: Endoscopy;  Laterality: N/A;   COLONOSCOPY WITH PROPOFOL N/A 02/19/2020   Procedure: COLONOSCOPY WITH PROPOFOL;  Surgeon: [name], MD;  Location: [facility] ENDOSCOPY;  Service: Gastroenterology;  Laterality: N/A;   FACELIFT  94   LOOP RECORDER INSERTION N/A 08/23/2017   Procedure: LOOP RECORDER INSERTION;  Surgeon: Thompson Grayer, MD;  Location: [place] CV LAB;  Service: Cardiovascular;  Laterality: N/A;   TEE WITHOUT CARDIOVERSION N/A 08/12/2017   Procedure: TRANSESOPHAGEAL ECHOCARDIOGRAM (TEE);  Surgeon: Melody Margarita, MD;  Location: [facility] ENDOSCOPY;  Service: Cardiovascular;  Laterality: N/A;   TONSILLECTOMY     TOTAL HIP ARTHROPLASTY Right 04/22/2016   Procedure: TOTAL HIP ARTHROPLASTY;  Surgeon: Corky Mull, MD;  Location: [facility] ORS;  Service: Orthopedics;  Laterality: Right;    Prior to Admission medications   Medication Sig Start Date End Date Taking? Authorizing Provider  albuterol (VENTOLIN HFA) 108 (90 Base) MCG/ACT inhaler TAKE 2 PUFFS BY MOUTH EVERY 6 HOURS AS NEEDED FOR WHEEZE OR SHORTNESS OF BREATH 02/06/21   Burnard Hawthorne, FNP  amLODipine (NORVASC) 5 MG tablet TAKE 1 TABLET BY MOUTH DAILY 06/09/21   Burnard Hawthorne, FNP  ASPIRIN LOW DOSE 81 MG EC tablet TAKE 1 TABLET (81 MG TOTAL) BY MOUTH  DAILY FOR 21 DAYS. SWALLOW WHOLE. 03/27/21   Fayrene Helper, MD  atorvastatin (LIPITOR) 80 MG tablet Take 1 tablet (80 mg total) by mouth daily. 02/06/21   Burnard Hawthorne, FNP  buPROPion (WELLBUTRIN XL) 300 MG 24 hr tablet TAKE ONE TABLET BY MOUTH EVERY DAY 05/11/21   Burnard Hawthorne, FNP  clopidogrel (PLAVIX) 75 MG tablet Take 1 tablet (75 mg total) by mouth daily. 02/06/21   Burnard Hawthorne, FNP  DULoxetine (CYMBALTA) 30 MG capsule TAKE 1 CAPSULE BY MOUTH EVERY DAY 05/11/21   Burnard Hawthorne, FNP  famotidine (PEPCID) 20 MG tablet TAKE ONE TABLET BY MOUTH AT BEDTIME 05/11/21   Burnard Hawthorne, FNP  fluticasone (FLONASE) 50 MCG/ACT nasal spray SPRAY 2 SPRAYS INTO EACH NOSTRIL EVERY DAY Patient not taking: Reported on 06/24/2021 02/06/21   Burnard Hawthorne, FNP  predniSONE (DELTASONE) 20 MG tablet Take 3 tablets (60 mg total) by mouth daily with breakfast for 3 days, THEN 2 tablets (40 mg total) daily with breakfast for 3 days, THEN 1 tablet (20 mg total) daily with breakfast for 3 days. 06/24/21 07/03/21  Gillis Santa, MD  pregabalin (LYRICA) 50 MG capsule TAKE 1 CAPSULE (50 MG) BY MOUTH EVERY DAY AND TAKE 2 CAPSULES (100 MG) AT BEDTIME. Max daily dose '150mg'$ /day. 06/10/21   Gillis Santa, MD    Allergies  Allergen Reactions   Latex Itching   Aleve [Naproxen Sodium] Other (See Comments)    Gi upset   Aspirin Other (See Comments)    Stomach pain-aggravates diverticulosis   Celebrex [Celecoxib] Other (See Comments)    Dizziness    Effexor [Venlafaxine] Other (See Comments)    Hot flashes    Gabapentin Other (See Comments)    "Night terrors"   Hydrocodone Bit-Homatrop Mbr Diarrhea   Ibuprofen Other (See Comments)    Gi upset    Mobic [Meloxicam]     Stomach upset   Tape Itching and Other (See Comments)    Itchy blisters   Vioxx [Rofecoxib] Other (See Comments)    Gi upset   Other Rash and Other (See Comments)    bandaides   Tramadol Other (See Comments)    hallucinations     Family History  Problem Relation Age of Onset   Lung cancer Father    Aneurysm Brother    Stroke Brother    Diabetes Maternal Grandmother    Kidney disease Maternal Grandfather    Cushing syndrome Paternal Grandmother    Dementia Paternal Grandfather    Breast cancer Maternal Aunt 52    Social History Social History   Tobacco Use   Smoking status: Former    Packs/day: 0.75    Years: 40.00    Pack years: 30.00    Types: Cigarettes    Quit date: 11/13/1996    Years since quitting: 24.6   Smokeless tobacco: Never  Vaping Use   Vaping Use: Never used  Substance Use Topics   Alcohol use: Yes    Comment: occ wine   Drug use: No    Review of Systems Constitutional: Negative for LOC. Cardiovascular: Negative for chest pain. Respiratory: Negative for shortness of breath. Gastrointestinal: Negative for abdominal pain, vomiting Musculoskeletal: Negative for musculoskeletal complaints Skin: Bruising to her forehead. Neurological: Negative for headache All other ROS negative  ____________________________________________   PHYSICAL EXAM:  VITAL SIGNS: ED Triage Vitals  Enc Vitals Group     BP 06/29/21 1318 139/79     Pulse Rate 06/29/21 1318 88     Resp 06/29/21 1318 18     Temp 06/29/21 1318 97.7 F (36.5 C)     Temp Source 06/29/21 1318 Oral     SpO2 06/29/21 1318 100 %     Weight 06/29/21 1319 179 lb 14.3 oz (81.6 kg)     Height 06/29/21 1319 '5\' 4"'$  (1.626 m)     Head Circumference --      Peak Flow --      Pain Score 06/29/21 1319 0     Pain Loc --      Pain Edu? --      Excl. in [place]? --    Constitutional: Alert. Well appearing and in no distress. Eyes: Normal exam ENT      Head: Older appearing forehead bruise/ecchymosis.      Mouth/Throat: Mucous membranes are moist. Cardiovascular: Normal rate, regular rhythm. Respiratory: Normal respiratory effort without tachypnea nor retractions. Breath sounds are clear  Gastrointestinal: Soft and nontender. No  distention.  Musculoskeletal: Mild pain with range of motion of left hip, chronic per patient.  Good range of motion with no significant pain elicited.  Neurologic:  Normal speech and language. No gross focal neurologic deficits Skin:  Skin is warm, dry and intact.  Psychiatric: Mood and affect are normal.  ____________________________________________   RADIOLOGY  CT head shows no acute abnormality.  Chronic infarcts.  ____________________________________________   INITIAL IMPRESSION / ASSESSMENT AND PLAN / ED COURSE  Pertinent labs & imaging results that were available during my care of the patient were reviewed by me and considered in my medical decision making (see chart for details).   Patient presents emergency department after multiple falls over the past week or 2.  Currently the patient appears well has no complaints.  Does have older appearing bruising to her forehead.  CT scan of the head is negative for acute abnormality.  Lab work is largely within normal limits.  Urinalysis and COVID swab pending.  Overall patient appears well she does have chronic left hip pain which she sees pain management for which could be the cause of her falls.  Urinalysis is negative.  Patient will be discharged home.  Patient's husband is here with the patient.  He is agreeable to plan of care.  We will follow-up with her doctor.  Melody Ochoa was evaluated in Emergency Department on 06/29/2021 for the symptoms described in the history of present illness. She was evaluated in the context of the global COVID-19 pandemic, which necessitated consideration that the patient might be at risk for infection with the SARS-CoV-2 virus that causes COVID-19. Institutional protocols and algorithms that pertain to the evaluation of patients at risk for COVID-19 are in a state of rapid change based on information released by regulatory bodies including the CDC and federal and state organizations. These policies and  algorithms were followed during the patient's care in the ED.  ____________________________________________   FINAL CLINICAL IMPRESSION(S) / ED DIAGNOSES  Falls   Harvest Dark, MD 06/29/21 [phone]

## 2021-06-29 NOTE — Telephone Encounter (Signed)
Pt called and states that she was on the floor and unable to walk. Someone was in the background telling pt that she needed to call 911 because she was lying on the floor. I stated that we needed to call the ambulance because she needed assistance. Pt states that she is going to call for medical help via 911 and hung up the phone before I could say anything else.

## 2021-07-13 ENCOUNTER — Other Ambulatory Visit: Payer: Self-pay

## 2021-07-13 ENCOUNTER — Ambulatory Visit (INDEPENDENT_AMBULATORY_CARE_PROVIDER_SITE_OTHER): Payer: Medicare Other | Admitting: Family

## 2021-07-13 ENCOUNTER — Other Ambulatory Visit: Payer: Self-pay | Admitting: Family

## 2021-07-13 ENCOUNTER — Telehealth: Payer: Self-pay | Admitting: Family

## 2021-07-13 ENCOUNTER — Encounter: Payer: Self-pay | Admitting: Family

## 2021-07-13 VITALS — BP 110/58 | HR 87 | Temp 98.1°F

## 2021-07-13 DIAGNOSIS — J8410 Pulmonary fibrosis, unspecified: Secondary | ICD-10-CM | POA: Diagnosis not present

## 2021-07-13 DIAGNOSIS — N1831 Chronic kidney disease, stage 3a: Secondary | ICD-10-CM | POA: Diagnosis not present

## 2021-07-13 DIAGNOSIS — G894 Chronic pain syndrome: Secondary | ICD-10-CM

## 2021-07-13 DIAGNOSIS — M7989 Other specified soft tissue disorders: Secondary | ICD-10-CM | POA: Diagnosis not present

## 2021-07-13 DIAGNOSIS — I639 Cerebral infarction, unspecified: Secondary | ICD-10-CM | POA: Diagnosis not present

## 2021-07-13 NOTE — Patient Instructions (Signed)
Compression stockings for swelling  Elevate your legs  LOW salt diet    Nice to see you!

## 2021-07-13 NOTE — Assessment & Plan Note (Addendum)
Chronic. No orthopnea.  No evidence of fluid overload today.  Discussed conservative therapy and provided prescription for compression stockings to try first.  Counseled on being more deliberate Will and avoiding salt in food.  Consider medication in the future

## 2021-07-13 NOTE — Telephone Encounter (Signed)
Call Dr Manuella Ghazi at [facility] Neurology-   please confirm IF patient is to be on both aspirin '81mg'$  AND plavix '75mg'$  QD.  Last note is not clear.   I am concerned with frequent falls. Please also confirm she has f/u appt scheduled

## 2021-07-13 NOTE — Progress Notes (Signed)
Subjective:    Patient ID: Melody Ochoa, female    DOB: 24-Feb-1941, 80 y.o.   MRN: [id number]  CC: Melody Ochoa is a 80 y.o. female who presents today for follow up.   HPI: Accompanied by husband who is in waiting room. He drove her here today.   She is falling frequently She is interested assisted living.   HTN -compliant with amlodipine 5 mg  Thoracic aortic atherosclerosis-compliant with Lipitor 80 mg  H/o stroke. She remains compliant with aspirin 81 mg, plavix $RemoveBe'75mg'vQLCcrJZS$ . CKD- no nsaids.  She had a consult with Dr. Lanora Manis, nephrology , however she canceled this.   Bilateral leg swelling, x 2 months, unchanged.  She doesn't elevate.  No sob, orthopnea.  No salt indiscretion but not avoiding salt in purchased foods.   She continues to follow with pain management clinic, Dr. Holley Raring lumbar radiculopathy.  He gave her a steroid taper 06/24/2021 which she has finished.  left hip pain and low back pain have improved.   She is compliant with Cymbalta 30 mg, Lyrica $RemoveBe'50mg'vaVzqatmu$  ; she takes Lyrica 50 mg in the morning and 100 mg at night. Some days she takes lyrica $RemoveBefo'50mg'BkppIxzEEUr$  BID which is helpful for pain relief.         Fall 06/29/21 seen in ED [facility]  Ct head negative acute abnormality.  She has chronic infarcts as well as chronic microvascular ischemic changes and cerebral volume loss Labs significant for hypokalemia, protein in urine  She has seen Dr Manuella Ghazi 05/2021. Advised to continue stain and plavix for h/o stroke, memory impairment.    Negative multiple myeloma screen 05/21/2020   LDL 47 on 12/10/20  Echocardiogram 12/10/2020, left ventricular ejection fraction 55%.  Grade 1 diastolic dysfunction.  HISTORY:  Past Medical History:  Diagnosis Date   Arthritis    Atheromatous plaque 10/18/2017   Back pain    Cancer ([place])    skin   Depression    Diverticulosis    Dizziness    patient had episode of dizziness when came in room. hx past no dx   GERD (gastroesophageal reflux  disease)    Heart murmur    Hyperlipidemia 10/18/2017   PONV (postoperative nausea and vomiting)    yrs ago   Squamous cell carcinoma of skin 11/16/2016   L chest parasternal   Squamous cell carcinoma of skin 02/05/2020   L cheek inf to lat zygoma   Stroke [facility])    Past Surgical History:  Procedure Laterality Date   BACK SURGERY  13   BREAST BIOPSY Bilateral    cores "years ago"   BREAST EXCISIONAL BIOPSY Left 90's   CATARACT EXTRACTION W/PHACO Left 12/20/2018   Procedure: CATARACT EXTRACTION PHACO AND INTRAOCULAR LENS PLACEMENT ([place]) LEFT TOPICAL;  Surgeon: Leandrew Koyanagi, MD;  Location: [place];  Service: Ophthalmology;  Laterality: Left;   CATARACT EXTRACTION W/PHACO Right 01/24/2019   Procedure: CATARACT EXTRACTION PHACO AND INTRAOCULAR LENS PLACEMENT ([place]) RIGHT;  Surgeon: Leandrew Koyanagi, MD;  Location: [place];  Service: Ophthalmology;  Laterality: Right;   CERVICAL DISC SURGERY  09   COLONOSCOPY WITH PROPOFOL N/A 01/13/2016   Procedure: COLONOSCOPY WITH PROPOFOL;  Surgeon: [name], MD;  Location: [facility] ENDOSCOPY;  Service: Endoscopy;  Laterality: N/A;   COLONOSCOPY WITH PROPOFOL N/A 02/19/2020   Procedure: COLONOSCOPY WITH PROPOFOL;  Surgeon: [name], MD;  Location: [facility] ENDOSCOPY;  Service: Gastroenterology;  Laterality: N/A;   FACELIFT  94   LOOP RECORDER INSERTION N/A 08/23/2017  Procedure: LOOP RECORDER INSERTION;  Surgeon: Thompson Grayer, MD;  Location: [place] CV LAB;  Service: Cardiovascular;  Laterality: N/A;   TEE WITHOUT CARDIOVERSION N/A 08/12/2017   Procedure: TRANSESOPHAGEAL ECHOCARDIOGRAM (TEE);  Surgeon: Sueanne Margarita, MD;  Location: [facility] ENDOSCOPY;  Service: Cardiovascular;  Laterality: N/A;   TONSILLECTOMY     TOTAL HIP ARTHROPLASTY Right 04/22/2016   Procedure: TOTAL HIP ARTHROPLASTY;  Surgeon: Corky Mull, MD;  Location: [facility] ORS;  Service: Orthopedics;  Laterality: Right;   Family History  Problem Relation Age of  Onset   Lung cancer Father    Aneurysm Brother    Stroke Brother    Diabetes Maternal Grandmother    Kidney disease Maternal Grandfather    Cushing syndrome Paternal Grandmother    Dementia Paternal Grandfather    Breast cancer Maternal Aunt 52    Allergies: Latex, Aleve [naproxen sodium], Aspirin, Celebrex [celecoxib], Effexor [venlafaxine], Gabapentin, Hydrocodone bit-homatrop mbr, Ibuprofen, Mobic [meloxicam], Tape, Vioxx [rofecoxib], Other, and Tramadol Current Outpatient Medications on File Prior to Visit  Medication Sig Dispense Refill   albuterol (VENTOLIN HFA) 108 (90 Base) MCG/ACT inhaler TAKE 2 PUFFS BY MOUTH EVERY 6 HOURS AS NEEDED FOR WHEEZE OR SHORTNESS OF BREATH 8.5 each 2   amLODipine (NORVASC) 5 MG tablet TAKE 1 TABLET BY MOUTH DAILY 30 tablet 0   ASPIRIN LOW DOSE 81 MG EC tablet TAKE 1 TABLET (81 MG TOTAL) BY MOUTH DAILY FOR 21 DAYS. SWALLOW WHOLE. 21 tablet 0   atorvastatin (LIPITOR) 80 MG tablet Take 1 tablet (80 mg total) by mouth daily. 90 tablet 0   buPROPion (WELLBUTRIN XL) 300 MG 24 hr tablet TAKE ONE TABLET BY MOUTH EVERY DAY 90 tablet 1   clopidogrel (PLAVIX) 75 MG tablet Take 1 tablet (75 mg total) by mouth daily. 90 tablet 3   DULoxetine (CYMBALTA) 30 MG capsule TAKE 1 CAPSULE BY MOUTH EVERY DAY 90 capsule 1   famotidine (PEPCID) 20 MG tablet TAKE ONE TABLET BY MOUTH AT BEDTIME 90 tablet 1   fluticasone (FLONASE) 50 MCG/ACT nasal spray SPRAY 2 SPRAYS INTO EACH NOSTRIL EVERY DAY 48 mL 1   pregabalin (LYRICA) 50 MG capsule TAKE 1 CAPSULE (50 MG) BY MOUTH EVERY DAY AND TAKE 2 CAPSULES (100 MG) AT BEDTIME. Max daily dose $RemoveBe'150mg'euWFNzuLx$ /day. 90 capsule 1   No current facility-administered medications on file prior to visit.    Social History   Tobacco Use   Smoking status: Former    Packs/day: 0.75    Years: 40.00    Pack years: 30.00    Types: Cigarettes    Quit date: 11/13/1996    Years since quitting: 24.6   Smokeless tobacco: Never  Vaping Use   Vaping Use:  Never used  Substance Use Topics   Alcohol use: Yes    Comment: occ wine   Drug use: No    Review of Systems  Constitutional:  Negative for chills and fever.  Respiratory:  Negative for cough and shortness of breath.   Cardiovascular:  Positive for leg swelling. Negative for chest pain and palpitations.  Gastrointestinal:  Negative for nausea and vomiting.  Musculoskeletal:  Positive for back pain.     Objective:    BP (!) 110/58 (BP Location: Left Arm, Patient Position: Sitting, Cuff Size: Large)   Pulse 87   Temp 98.1 F (36.7 C) (Oral)   SpO2 94%  BP Readings from Last 3 Encounters:  07/13/21 (!) 110/58  06/29/21 133/76  06/28/21 115/63   Wt  Readings from Last 3 Encounters:  06/29/21 179 lb 14.3 oz (81.6 kg)  06/28/21 180 lb (81.6 kg)  04/16/21 180 lb (81.6 kg)    Physical Exam Vitals reviewed.  Constitutional:      Appearance: She is well-developed.  Eyes:     Conjunctiva/sclera: Conjunctivae normal.  Cardiovascular:     Rate and Rhythm: Normal rate and regular rhythm.     Pulses: Normal pulses.     Heart sounds: Normal heart sounds.     Comments: +1 non pitting BLE edema.  No palpable cords or masses. No erythema or increased warmth. No asymmetry in calf size when compared bilaterally LE hair growth symmetric and present. No discoloration or varicosities noted. LE warm and palpable pedal pulses.  Pulmonary:     Effort: Pulmonary effort is normal.     Breath sounds: Normal breath sounds. No wheezing, rhonchi or rales.  Skin:    General: Skin is warm and dry.  Neurological:     Mental Status: She is alert.  Psychiatric:        Speech: Speech normal.        Behavior: Behavior normal.        Thought Content: Thought content normal.       Assessment & Plan:   Problem List Items Addressed This Visit       Cardiovascular and Mediastinum   Ischemic stroke ([place])    Appears stable today.  She is compliant with 81 mg aspirin, Plavix 75 mg.  I have a call  out to Dr. Manuella Ghazi, neurology as it relates to concern for increased risk of bleeding, particularly in setting of her very frequent falls.  We discussed in detail looking at assisted living places again today.  She is very  agreeable to this.  I provided her a list of places which Dr. Manuella Ghazi also recommended for her to review today.  She will call me questions. will follow         Genitourinary   Chronic kidney disease, stage 3a ([place]) - Primary   Relevant Orders   Microalbumin / creatinine urine ratio     Other   Chronic pain syndrome    Chronic, stable.  She continues to follow with Dr. Holley Raring.  Improvement after recent prednisone. In setting hypokalemia (3.1) , I consulted with pharmacist Catie Darnelle Maffucci ; Cymbalta may cause hypokalemia and hyponatremia.  Patient is going to hold Cymbalta 30 mg and repeat labs in 2 weeks.  Continue Lyrica $RemoveBeforeD'50mg'shAVOfRAoPSRdY$  ; she takes Lyrica 50 mg in the morning and 100 mg at night. Some days she takes lyrica $RemoveBefo'50mg'cyBmtNWFfdP$  BID.        Leg swelling    Chronic. No orthopnea.  No evidence of fluid overload today.  Discussed conservative therapy and provided prescription for compression stockings to try first.  Counseled on being more deliberate Will and avoiding salt in food.  Consider medication in the future       Relevant Orders   Comprehensive metabolic panel (Completed)   For home use only DME Other see comment     I am having [name] maintain her fluticasone, clopidogrel, atorvastatin, albuterol, Aspirin Low Dose, DULoxetine, buPROPion, famotidine, amLODipine, and pregabalin.   No orders of the defined types were placed in this encounter.   Return precautions given.   Risks, benefits, and alternatives of the medications and treatment plan prescribed today were discussed, and patient expressed understanding.   Education regarding symptom management and diagnosis given to patient on  AVS.  Continue to follow with Burnard Hawthorne, FNP for routine health  maintenance.   [name] and I agreed with plan.   [name], FNP

## 2021-07-14 LAB — COMPREHENSIVE METABOLIC PANEL
ALT: 15 U/L (ref 0–35)
AST: 13 U/L (ref 0–37)
Albumin: 3.5 g/dL (ref 3.5–5.2)
Alkaline Phosphatase: 71 U/L (ref 39–117)
BUN: 20 mg/dL (ref 6–23)
CO2: 28 mEq/L (ref 19–32)
Calcium: 8.8 mg/dL (ref 8.4–10.5)
Chloride: 104 mEq/L (ref 96–112)
Creatinine, Ser: 1.13 mg/dL (ref 0.40–1.20)
GFR: 45.74 mL/min — ABNORMAL LOW (ref >60.00)
Glucose, Bld: 79 mg/dL (ref 70–99)
Potassium: 3.1 mEq/L — ABNORMAL LOW (ref 3.5–5.1)
Sodium: 141 mEq/L (ref 135–145)
Total Bilirubin: 0.4 mg/dL (ref 0.2–1.2)
Total Protein: 6.4 g/dL (ref 6.0–8.3)

## 2021-07-15 NOTE — Assessment & Plan Note (Addendum)
Chronic, stable.  She continues to follow with Dr. Holley Raring.  Improvement after recent prednisone. In setting hypokalemia (3.1) , I consulted with pharmacist Catie Darnelle Maffucci ; Cymbalta may cause hypokalemia and hyponatremia.  Patient is going to hold Cymbalta 30 mg and repeat labs in 2 weeks.  Continue Lyrica '50mg'$  ; she takes Lyrica 50 mg in the morning and 100 mg at night. Some days she takes lyrica '50mg'$  BID.

## 2021-07-15 NOTE — Telephone Encounter (Signed)
I have called Dr. Trena Platt office 3x & stayed on hold 5-10 minutes each time. Will call back when I have more time to wait.

## 2021-07-15 NOTE — Assessment & Plan Note (Addendum)
Appears stable today.  She is compliant with 81 mg aspirin, Plavix 75 mg.  I have a call out to Dr. Manuella Ghazi, neurology as it relates to concern for increased risk of bleeding, particularly in setting of her very frequent falls.  We discussed in detail looking at assisted living places again today.  She is very  agreeable to this.  I provided her a list of places which Dr. Manuella Ghazi also recommended for her to review today.  She will call me questions. will follow

## 2021-07-16 ENCOUNTER — Other Ambulatory Visit: Payer: Medicare Other

## 2021-07-16 ENCOUNTER — Other Ambulatory Visit: Payer: Self-pay

## 2021-07-16 DIAGNOSIS — N1831 Chronic kidney disease, stage 3a: Secondary | ICD-10-CM | POA: Diagnosis not present

## 2021-07-16 LAB — MICROALBUMIN / CREATININE URINE RATIO
Creatinine,U: 170.1 mg/dL
Microalb Creat Ratio: 0.7 mg/g (ref 0.0–30.0)
Microalb, Ur: 1.1 mg/dL (ref 0.0–1.9)

## 2021-07-17 ENCOUNTER — Telehealth: Payer: Self-pay | Admitting: Family

## 2021-07-17 DIAGNOSIS — M1632 Unilateral osteoarthritis resulting from hip dysplasia, left hip: Secondary | ICD-10-CM | POA: Diagnosis not present

## 2021-07-17 NOTE — Telephone Encounter (Signed)
Please call Mardene Celeste at [phone].

## 2021-07-17 NOTE — Telephone Encounter (Signed)
LMTCB

## 2021-07-17 NOTE — Telephone Encounter (Signed)
Melody Ochoa called in on behalf of the patient and stated that [facility] to see if she needs a hip replacement.They are unsure that the patients needs the hip replacement,please advise.

## 2021-07-20 ENCOUNTER — Telehealth: Payer: Self-pay | Admitting: Family

## 2021-07-20 NOTE — Telephone Encounter (Signed)
Noted Pt see Dr Harlow Mares at [facility] and last visit 07/19/21, it appears he advised left hip replacement  Not sure what the call is pertaining too. She can call dr Harlow Mares regarding questions related to procedure.  She will need cardiology and pulmonology clearance and should reach out to both

## 2021-07-20 NOTE — Telephone Encounter (Signed)
JW did you guys discuss this at her visit at all?

## 2021-07-20 NOTE — Telephone Encounter (Signed)
Patient would like for you to know it is ok to talk to [facility] about her urinalysis.

## 2021-07-21 DIAGNOSIS — Z87891 Personal history of nicotine dependence: Secondary | ICD-10-CM | POA: Diagnosis not present

## 2021-07-21 DIAGNOSIS — Z8673 Personal history of transient ischemic attack (TIA), and cerebral infarction without residual deficits: Secondary | ICD-10-CM | POA: Diagnosis not present

## 2021-07-21 DIAGNOSIS — F32A Depression, unspecified: Secondary | ICD-10-CM | POA: Diagnosis not present

## 2021-07-21 DIAGNOSIS — M1632 Unilateral osteoarthritis resulting from hip dysplasia, left hip: Secondary | ICD-10-CM | POA: Diagnosis not present

## 2021-07-21 DIAGNOSIS — G4733 Obstructive sleep apnea (adult) (pediatric): Secondary | ICD-10-CM | POA: Diagnosis not present

## 2021-07-21 DIAGNOSIS — D649 Anemia, unspecified: Secondary | ICD-10-CM | POA: Diagnosis not present

## 2021-07-21 DIAGNOSIS — L409 Psoriasis, unspecified: Secondary | ICD-10-CM | POA: Diagnosis not present

## 2021-07-21 DIAGNOSIS — M2550 Pain in unspecified joint: Secondary | ICD-10-CM | POA: Diagnosis not present

## 2021-07-21 DIAGNOSIS — M7072 Other bursitis of hip, left hip: Secondary | ICD-10-CM | POA: Diagnosis not present

## 2021-07-21 DIAGNOSIS — Z7952 Long term (current) use of systemic steroids: Secondary | ICD-10-CM | POA: Diagnosis not present

## 2021-07-21 DIAGNOSIS — Z9181 History of falling: Secondary | ICD-10-CM | POA: Diagnosis not present

## 2021-07-21 DIAGNOSIS — M7542 Impingement syndrome of left shoulder: Secondary | ICD-10-CM | POA: Diagnosis not present

## 2021-07-22 ENCOUNTER — Other Ambulatory Visit: Payer: Self-pay

## 2021-07-22 ENCOUNTER — Ambulatory Visit
Payer: Medicare Other | Attending: Student in an Organized Health Care Education/Training Program | Admitting: Student in an Organized Health Care Education/Training Program

## 2021-07-22 DIAGNOSIS — M19012 Primary osteoarthritis, left shoulder: Secondary | ICD-10-CM

## 2021-07-22 DIAGNOSIS — M5416 Radiculopathy, lumbar region: Secondary | ICD-10-CM

## 2021-07-22 DIAGNOSIS — M533 Sacrococcygeal disorders, not elsewhere classified: Secondary | ICD-10-CM

## 2021-07-22 DIAGNOSIS — M25512 Pain in left shoulder: Secondary | ICD-10-CM

## 2021-07-22 DIAGNOSIS — G8929 Other chronic pain: Secondary | ICD-10-CM | POA: Diagnosis not present

## 2021-07-22 DIAGNOSIS — M1612 Unilateral primary osteoarthritis, left hip: Secondary | ICD-10-CM | POA: Diagnosis not present

## 2021-07-22 MED ORDER — OXYCODONE-ACETAMINOPHEN 5-325 MG PO TABS: 1.0000 | ORAL_TABLET | Freq: Two times a day (BID) | ORAL | 0 refills | Status: DC | PRN

## 2021-07-22 NOTE — Telephone Encounter (Signed)
PT called to get advise on wether or not she needs to have a hip replacement done. Please advise.

## 2021-07-22 NOTE — Progress Notes (Signed)
Patient: Melody Ochoa  Service Category: E/M  Provider: Gillis Santa, MD  DOB: 1940-12-20  DOS: 07/22/2021  Location: Office  MRN: [id number]  Setting: Ambulatory outpatient  Referring Provider: Burnard Hawthorne, FNP  Type: Established Patient  Specialty: Interventional Pain Management  PCP: Burnard Hawthorne, FNP  Location: Remote location  Delivery: TeleHealth     Virtual Encounter - Pain Management PROVIDER NOTE: Information contained herein reflects review and annotations entered in association with encounter. Interpretation of such information and data should be left to medically-trained personnel. Information provided to patient can be located elsewhere in the medical record under "Patient Instructions". Document created using STT-dictation technology, any transcriptional errors that may result from process are unintentional.    Contact & Pharmacy Preferred: [phone] Home: [phone] (home) Mobile: [phone] (mobile) E-mail: [email or web address]  [facility] - [place], [place] - [address] [place] [address] Phone: [phone] Fax: [phone]   Pre-screening  Melody Ochoa offered "in-person" vs "virtual" encounter. She indicated preferring virtual for this encounter.   Reason COVID-19*  Social distancing based on CDC and AMA recommendations.   I contacted Melody Ochoa on 07/22/2021 via video conference.      I clearly identified myself as Gillis Santa, MD. I verified that I was speaking with the correct person using two identifiers (Name: Melody Ochoa, and date of birth: 1941/01/06).  Consent I sought verbal advanced consent from Melody Ochoa for virtual visit interactions. I informed Melody Ochoa of possible security and privacy concerns, risks, and limitations associated with providing "not-in-person" medical evaluation and management services. I also informed Melody Ochoa of the availability of "in-person" appointments.  Finally, I informed her that there would be a charge for the virtual visit and that she could be  personally, fully or partially, financially responsible for it. Melody Ochoa expressed understanding and agreed to proceed.   Historic Elements   Melody Ochoa is a 80 y.o. year old, female patient evaluated today after our last contact on 06/22/2021. Melody Ochoa  has a past medical history of Arthritis, Atheromatous plaque (10/18/2017), Back pain, Cancer ([place]), Depression, Diverticulosis, Dizziness, GERD (gastroesophageal reflux disease), Heart murmur, Hyperlipidemia (10/18/2017), PONV (postoperative nausea and vomiting), Squamous cell carcinoma of skin (11/16/2016), Squamous cell carcinoma of skin (02/05/2020), and Stroke [facility]). She also  has a past surgical history that includes Tonsillectomy; Facelift (94); Cervical disc surgery (09); Back surgery (13); Colonoscopy with propofol (N/A, 01/13/2016); Total hip arthroplasty (Right, 04/22/2016); TEE without cardioversion (N/A, 08/12/2017); LOOP RECORDER INSERTION (N/A, 08/23/2017); Breast excisional biopsy (Left, 90's); Breast biopsy (Bilateral); Cataract extraction w/PHACO (Left, 12/20/2018); Cataract extraction w/PHACO (Right, 01/24/2019); and Colonoscopy with propofol (N/A, 02/19/2020). Melody Ochoa has a current medication list which includes the following prescription(s): albuterol, amlodipine, aspirin low dose, atorvastatin, bupropion, clopidogrel, duloxetine, famotidine, fluticasone, oxycodone-acetaminophen, and pregabalin. She  reports that she quit smoking about 24 years ago. Her smoking use included cigarettes. She has a 30.00 pack-year smoking history. She has never used smokeless tobacco. She reports current alcohol use. She reports that she does not use drugs. Melody Ochoa is allergic to latex, aleve [naproxen sodium], aspirin, celebrex [celecoxib], effexor [venlafaxine], gabapentin, hydrocodone bit-homatrop mbr, ibuprofen, mobic [meloxicam], tape, vioxx  [rofecoxib], other, and tramadol.   HPI  Today, she is being contacted for medication management.  Patient is having increased left hip pain.  She did see Dr. Harlow Mares at [facility] and a left hip replacement was recommended.  I recommend that she follow-up  with Dr. Harlow Mares to get this scheduled. She is requesting a refill of her oxycodone.  Her last refill of oxycodone was in January 2022.  She takes this medication very seldomly, when she has increased pain flare.  She continues Lyrica 50 mg 3 times a day. I will send in a small prescription for oxycodone 5 mg as below. Patient is instructed to follow-up for a face-to-face visit for her next medication refill.  Pharmacotherapy Assessment   Analgesic: Oxycodone 5 mg daily as needed, takes very seldomly, 1 prescription usually last 3 to 4 months.     Monitoring:  PMP: PDMP not reviewed this encounter.       Pharmacotherapy: No side-effects or adverse reactions reported. Compliance: No problems identified. Effectiveness: Clinically acceptable. Plan: Refer to "POC". UDS:  Summary  Date Value Ref Range Status  01/23/2020 Note  Final    Comment:    ==================================================================== [employer] Select 13 (MW) ==================================================================== Test                             Result       Flag       Units   NO DRUGS DETECTED. ==================================================================== Test                      Result    Flag   Units      Ref Range   Creatinine              219              mg/dL      >=20 ==================================================================== Declared Medications:  The flagging and interpretation on this report are based on the  following declared medications.  Unexpected results may arise from  inaccuracies in the declared medications.  **Note: The testing scope of this panel does not include the  following reported  medications:  Albuterol  Amlodipine  Atorvastatin  Bupropion  Clopidogrel  Duloxetine  Famotidine  Fluticasone  Pregabalin  Tiotropium  Triamcinolone ==================================================================== For clinical consultation, please call [phone]. ====================================================================      Laboratory Chemistry Profile   Renal Lab Results  Component Value Date   BUN 20 07/13/2021   CREATININE 1.13 07/13/2021   BCR 15 03/06/2021   GFR 45.74 (L) 07/13/2021   GFRAA 59 (L) 03/11/2020   GFRNONAA 57 (L) 06/29/2021    Hepatic Lab Results  Component Value Date   AST 13 07/13/2021   ALT 15 07/13/2021   ALBUMIN 3.5 07/13/2021   ALKPHOS 71 07/13/2021    Electrolytes Lab Results  Component Value Date   NA 141 07/13/2021   K 3.1 (L) 07/13/2021   CL 104 07/13/2021   CALCIUM 8.8 07/13/2021    Bone No results found for: VD25OH, VD125OH2TOT, CL2751ZG0, FV4944HQ7, 25OHVITD1, 25OHVITD2, 25OHVITD3, TESTOFREE, TESTOSTERONE  Inflammation (CRP: Acute Phase) (ESR: Chronic Phase) No results found for: CRP, ESRSEDRATE, LATICACIDVEN       Note: Above Lab results reviewed.  Imaging  CT Head Wo Contrast CLINICAL DATA:  Head trauma, minor (Age >= 65y). Multiple falls. On Eliquis.  EXAM: CT HEAD WITHOUT CONTRAST  TECHNIQUE: Contiguous axial images were obtained from the base of the skull through the vertex without intravenous contrast.  COMPARISON:  12/10/2020  FINDINGS: Brain: No evidence of acute infarction, hemorrhage, hydrocephalus, extra-axial collection or mass lesion/mass effect. Chronic infarcts within the right frontal lobe, left parietooccipital lobe, and left cerebellum. Mild low-density changes within  the periventricular and subcortical white matter compatible with chronic microvascular ischemic change. Mild diffuse cerebral volume loss.  Vascular: Atherosclerotic calcifications involving the large  vessels of the skull base. No unexpected hyperdense vessel.  Skull: Normal. Negative for fracture or focal lesion.  Sinuses/Orbits: No acute finding.  Other: Negative for scalp hematoma.  IMPRESSION: 1. No acute intracranial findings. 2. Chronic infarcts as well as chronic microvascular ischemic change and cerebral volume loss.  Electronically Signed   By: Davina Poke D.O.   On: 06/29/2021 14:06  Assessment  The primary encounter diagnosis was Primary osteoarthritis of left hip. Diagnoses of Lumbar radiculopathy, Primary osteoarthritis of left shoulder, SI (sacroiliac) joint dysfunction, and Chronic left shoulder pain were also pertinent to this visit.  Plan of Care   Melody Ochoa has a current medication list which includes the following long-term medication(s): albuterol, amlodipine, atorvastatin, bupropion, clopidogrel, duloxetine, famotidine, fluticasone, and pregabalin.  Pharmacotherapy (Medications Ordered): Meds ordered this encounter  Medications   oxyCODONE-acetaminophen (PERCOCET) 5-325 MG tablet    Sig: Take 1 tablet by mouth every 12 (twelve) hours as needed for severe pain. Must last 30 days.    Dispense:  60 tablet    Refill:  0    Chronic Pain: STOP Act (Not applicable) Fill 1 day early if closed on refill date. Avoid benzodiazepines within 8 hours of opioids  Continue Lyrica as prescribed Follow-up with Dr. Harlow Mares with [facility] regarding left hip replacement  Follow-up plan:   Return if symptoms worsen or fail to improve.     Status post left shoulder intra-articular injection, posterior approach on 08/08/2019, 09/17/2019- helped, repeat PRN.   Left sacroiliac joint injection 09/24/2020              Recent Visits Date Type Provider Dept  06/24/21 Telemedicine Gillis Santa, MD [facility]  06/10/21 Telemedicine Gillis Santa, MD [facility]  Showing recent visits within past 90 days and meeting all other  requirements Today's Visits Date Type Provider Dept  07/22/21 Telemedicine Gillis Santa, MD [facility]  Showing today's visits and meeting all other requirements Future Appointments No visits were found meeting these conditions. Showing future appointments within next 90 days and meeting all other requirements I discussed the assessment and treatment plan with the patient. The patient was provided an opportunity to ask questions and all were answered. The patient agreed with the plan and demonstrated an understanding of the instructions.  Patient advised to call back or seek an in-person evaluation if the symptoms or condition worsens.  Duration of encounter: 43minutes.  Note by: Gillis Santa, MD Date: 07/22/2021; Time: 3:21 PM

## 2021-07-24 NOTE — Telephone Encounter (Signed)
Pt called back and I informed her that she would need to follow up with both cardiology and pulmonology for clearance with them for the hip surgery. Pt gave a verbal understanding and stated that she would scheduled an appt with both.

## 2021-07-24 NOTE — Telephone Encounter (Signed)
LMTCB

## 2021-07-27 ENCOUNTER — Telehealth: Payer: Self-pay | Admitting: Family

## 2021-07-27 NOTE — Telephone Encounter (Signed)
Patient is unsure which doctors she is supposed to see for her hip,her cardiologist told her she needs to see this doctor before her surgery.Please advise.

## 2021-07-28 NOTE — Telephone Encounter (Signed)
Patient is calling to check on the status of the message below and about surgical clearance.

## 2021-07-29 ENCOUNTER — Other Ambulatory Visit: Payer: Medicare Other

## 2021-07-29 ENCOUNTER — Telehealth: Payer: Self-pay | Admitting: *Deleted

## 2021-07-29 ENCOUNTER — Other Ambulatory Visit: Payer: Self-pay | Admitting: Family

## 2021-07-29 DIAGNOSIS — I10 Essential (primary) hypertension: Secondary | ICD-10-CM

## 2021-07-29 NOTE — Telephone Encounter (Signed)
LMTCB

## 2021-07-29 NOTE — Telephone Encounter (Signed)
Please asked patient what surgery she is planning   I cannot see  latest notes.  Which orthopedic ?   I would advise her that she would need surgical clearance from both pulmonology, Dr. Patsey Berthold and also cardiology, Dr. Caryl Comes.  Please ask her to call both these offices and schedule an appointment prior to surgery

## 2021-07-29 NOTE — Telephone Encounter (Signed)
Please place future orders for lab appt.    Pt has a lab appt this afternoon

## 2021-07-30 ENCOUNTER — Other Ambulatory Visit: Payer: Self-pay

## 2021-07-30 ENCOUNTER — Inpatient Hospital Stay
Admission: EM | Admit: 2021-07-30 | Discharge: 2021-08-11 | DRG: 469 | Disposition: A | Discharge status: Skilled Nursing Facility | Payer: Medicare Other | Attending: Internal Medicine | Admitting: Internal Medicine

## 2021-07-30 ENCOUNTER — Emergency Department: Payer: Medicare Other

## 2021-07-30 DIAGNOSIS — J8410 Pulmonary fibrosis, unspecified: Secondary | ICD-10-CM | POA: Diagnosis present

## 2021-07-30 DIAGNOSIS — Z743 Need for continuous supervision: Secondary | ICD-10-CM | POA: Diagnosis not present

## 2021-07-30 DIAGNOSIS — G8929 Other chronic pain: Secondary | ICD-10-CM | POA: Diagnosis present

## 2021-07-30 DIAGNOSIS — J9601 Acute respiratory failure with hypoxia: Secondary | ICD-10-CM | POA: Diagnosis present

## 2021-07-30 DIAGNOSIS — K219 Gastro-esophageal reflux disease without esophagitis: Secondary | ICD-10-CM | POA: Diagnosis present

## 2021-07-30 DIAGNOSIS — Z888 Allergy status to other drugs, medicaments and biological substances status: Secondary | ICD-10-CM

## 2021-07-30 DIAGNOSIS — Z91048 Other nonmedicinal substance allergy status: Secondary | ICD-10-CM

## 2021-07-30 DIAGNOSIS — M25552 Pain in left hip: Secondary | ICD-10-CM

## 2021-07-30 DIAGNOSIS — W19XXXA Unspecified fall, initial encounter: Secondary | ICD-10-CM

## 2021-07-30 DIAGNOSIS — Z96641 Presence of right artificial hip joint: Secondary | ICD-10-CM | POA: Diagnosis not present

## 2021-07-30 DIAGNOSIS — F32A Depression, unspecified: Secondary | ICD-10-CM | POA: Diagnosis present

## 2021-07-30 DIAGNOSIS — G629 Polyneuropathy, unspecified: Secondary | ICD-10-CM | POA: Diagnosis present

## 2021-07-30 DIAGNOSIS — D62 Acute posthemorrhagic anemia: Secondary | ICD-10-CM | POA: Diagnosis not present

## 2021-07-30 DIAGNOSIS — Z885 Allergy status to narcotic agent status: Secondary | ICD-10-CM

## 2021-07-30 DIAGNOSIS — Z85828 Personal history of other malignant neoplasm of skin: Secondary | ICD-10-CM | POA: Diagnosis not present

## 2021-07-30 DIAGNOSIS — Z20822 Contact with and (suspected) exposure to covid-19: Secondary | ICD-10-CM | POA: Diagnosis not present

## 2021-07-30 DIAGNOSIS — R29818 Other symptoms and signs involving the nervous system: Secondary | ICD-10-CM | POA: Diagnosis not present

## 2021-07-30 DIAGNOSIS — Z981 Arthrodesis status: Secondary | ICD-10-CM | POA: Diagnosis not present

## 2021-07-30 DIAGNOSIS — G319 Degenerative disease of nervous system, unspecified: Secondary | ICD-10-CM | POA: Diagnosis not present

## 2021-07-30 DIAGNOSIS — D649 Anemia, unspecified: Secondary | ICD-10-CM | POA: Diagnosis not present

## 2021-07-30 DIAGNOSIS — Z886 Allergy status to analgesic agent status: Secondary | ICD-10-CM | POA: Diagnosis not present

## 2021-07-30 DIAGNOSIS — R296 Repeated falls: Secondary | ICD-10-CM | POA: Diagnosis present

## 2021-07-30 DIAGNOSIS — J849 Interstitial pulmonary disease, unspecified: Secondary | ICD-10-CM

## 2021-07-30 DIAGNOSIS — I517 Cardiomegaly: Secondary | ICD-10-CM | POA: Diagnosis not present

## 2021-07-30 DIAGNOSIS — I739 Peripheral vascular disease, unspecified: Secondary | ICD-10-CM | POA: Diagnosis not present

## 2021-07-30 DIAGNOSIS — R5381 Other malaise: Secondary | ICD-10-CM

## 2021-07-30 DIAGNOSIS — M2578 Osteophyte, vertebrae: Secondary | ICD-10-CM | POA: Diagnosis not present

## 2021-07-30 DIAGNOSIS — R9431 Abnormal electrocardiogram [ECG] [EKG]: Secondary | ICD-10-CM | POA: Diagnosis not present

## 2021-07-30 DIAGNOSIS — R531 Weakness: Secondary | ICD-10-CM | POA: Diagnosis present

## 2021-07-30 DIAGNOSIS — Z91040 Latex allergy status: Secondary | ICD-10-CM

## 2021-07-30 DIAGNOSIS — R410 Disorientation, unspecified: Secondary | ICD-10-CM | POA: Diagnosis not present

## 2021-07-30 DIAGNOSIS — E785 Hyperlipidemia, unspecified: Secondary | ICD-10-CM | POA: Diagnosis present

## 2021-07-30 DIAGNOSIS — Z7902 Long term (current) use of antithrombotics/antiplatelets: Secondary | ICD-10-CM

## 2021-07-30 DIAGNOSIS — Z96642 Presence of left artificial hip joint: Secondary | ICD-10-CM

## 2021-07-30 DIAGNOSIS — M25452 Effusion, left hip: Secondary | ICD-10-CM | POA: Diagnosis not present

## 2021-07-30 DIAGNOSIS — Z87891 Personal history of nicotine dependence: Secondary | ICD-10-CM

## 2021-07-30 DIAGNOSIS — R0902 Hypoxemia: Secondary | ICD-10-CM | POA: Diagnosis not present

## 2021-07-30 DIAGNOSIS — M1612 Unilateral primary osteoarthritis, left hip: Principal | ICD-10-CM | POA: Diagnosis present

## 2021-07-30 DIAGNOSIS — Z79899 Other long term (current) drug therapy: Secondary | ICD-10-CM

## 2021-07-30 DIAGNOSIS — R600 Localized edema: Secondary | ICD-10-CM | POA: Diagnosis not present

## 2021-07-30 DIAGNOSIS — Z8673 Personal history of transient ischemic attack (TIA), and cerebral infarction without residual deficits: Secondary | ICD-10-CM

## 2021-07-30 DIAGNOSIS — I959 Hypotension, unspecified: Secondary | ICD-10-CM | POA: Diagnosis not present

## 2021-07-30 DIAGNOSIS — I951 Orthostatic hypotension: Secondary | ICD-10-CM | POA: Diagnosis not present

## 2021-07-30 DIAGNOSIS — H5789 Other specified disorders of eye and adnexa: Secondary | ICD-10-CM | POA: Diagnosis not present

## 2021-07-30 DIAGNOSIS — Z7982 Long term (current) use of aspirin: Secondary | ICD-10-CM

## 2021-07-30 DIAGNOSIS — W1811XA Fall from or off toilet without subsequent striking against object, initial encounter: Secondary | ICD-10-CM | POA: Diagnosis present

## 2021-07-30 DIAGNOSIS — R262 Difficulty in walking, not elsewhere classified: Secondary | ICD-10-CM | POA: Diagnosis not present

## 2021-07-30 DIAGNOSIS — G6289 Other specified polyneuropathies: Secondary | ICD-10-CM | POA: Diagnosis not present

## 2021-07-30 LAB — URINALYSIS, COMPLETE (UACMP) WITH MICROSCOPIC
Bacteria, UA: NONE SEEN
Bilirubin Urine: NEGATIVE
Glucose, UA: NEGATIVE mg/dL
Hgb urine dipstick: NEGATIVE
Ketones, ur: NEGATIVE mg/dL
Leukocytes,Ua: NEGATIVE
Nitrite: NEGATIVE
Protein, ur: NEGATIVE mg/dL
Specific Gravity, Urine: 1.015 (ref 1.005–1.030)
pH: 5 (ref 5.0–8.0)

## 2021-07-30 LAB — CBC WITH DIFFERENTIAL/PLATELET
Abs Immature Granulocytes: 0.04 10*3/uL (ref 0.00–0.07)
Basophils Absolute: 0.1 10*3/uL (ref 0.0–0.1)
Basophils Relative: 1 %
Eosinophils Absolute: 0.4 10*3/uL (ref 0.0–0.5)
Eosinophils Relative: 5 %
HCT: 30.8 % — ABNORMAL LOW (ref 36.0–46.0)
Hemoglobin: 10 g/dL — ABNORMAL LOW (ref 12.0–15.0)
Immature Granulocytes: 1 %
Lymphocytes Relative: 17 %
Lymphs Abs: 1.2 10*3/uL (ref 0.7–4.0)
MCH: 28.3 pg (ref 26.0–34.0)
MCHC: 32.5 g/dL (ref 30.0–36.0)
MCV: 87.3 fL (ref 80.0–100.0)
Monocytes Absolute: 0.6 10*3/uL (ref 0.1–1.0)
Monocytes Relative: 9 %
Neutro Abs: 4.8 10*3/uL (ref 1.7–7.7)
Neutrophils Relative %: 67 %
Platelets: 278 10*3/uL (ref 150–400)
RBC: 3.53 MIL/uL — ABNORMAL LOW (ref 3.87–5.11)
RDW: 15.6 % — ABNORMAL HIGH (ref 11.5–15.5)
WBC: 7 10*3/uL (ref 4.0–10.5)
nRBC: 0 % (ref 0.0–0.2)

## 2021-07-30 LAB — COMPREHENSIVE METABOLIC PANEL
ALT: 11 U/L (ref 0–44)
AST: 18 U/L (ref 15–41)
Albumin: 3.2 g/dL — ABNORMAL LOW (ref 3.5–5.0)
Alkaline Phosphatase: 59 U/L (ref 38–126)
Anion gap: 7 (ref 5–15)
BUN: 16 mg/dL (ref 8–23)
CO2: 27 mmol/L (ref 22–32)
Calcium: 8.8 mg/dL — ABNORMAL LOW (ref 8.9–10.3)
Chloride: 105 mmol/L (ref 98–111)
Creatinine, Ser: 1.01 mg/dL — ABNORMAL HIGH (ref 0.44–1.00)
GFR, Estimated: 56 mL/min — ABNORMAL LOW (ref >60)
Glucose, Bld: 98 mg/dL (ref 70–99)
Potassium: 3.7 mmol/L (ref 3.5–5.1)
Sodium: 139 mmol/L (ref 135–145)
Total Bilirubin: 0.7 mg/dL (ref 0.3–1.2)
Total Protein: 6.8 g/dL (ref 6.5–8.1)

## 2021-07-30 LAB — TROPONIN I (HIGH SENSITIVITY)
Troponin I (High Sensitivity): 10 ng/L (ref <18)
Troponin I (High Sensitivity): 11 ng/L (ref <18)

## 2021-07-30 NOTE — ED Triage Notes (Signed)
PTA med list shows plavix '75mg'$  po daily as a home medication.

## 2021-07-30 NOTE — ED Triage Notes (Signed)
Pt from home via ems, fell off of the toilet - witnessed by husband who denied LOC to EMS but did report she hit her head and was worried about confusion. Pt a&ox4 upon arrival to ED, reports she takes a blood thinner and '81mg'$  aspirin daily - unsure of name of blood thinner.

## 2021-07-30 NOTE — ED Provider Notes (Signed)
[facility] Emergency Department Provider Note    Event Date/Time   First MD Initiated Contact with Patient 07/30/21 1250     (approximate)  I have reviewed the triage vital signs and the nursing notes.   HISTORY  Chief Complaint Fall    HPI Melody Ochoa is a 80 y.o. female presents to the ER for evaluation of weakness and sliding off of her toilet earlier this morning.  States that 4 days ago she started having weakness particularly left side and developed inability to walk.  States that she normally gets around with a rolling walker but is not able to do that for past 2 days.  Denies any fevers.  No headaches.  Does not recall whether she struck her head or not this morning.  Reported was having some trouble finding words this morning but that quickly resolved.  Past Medical History:  Diagnosis Date   Arthritis    Atheromatous plaque 10/18/2017   Back pain    Cancer ([place])    skin   Depression    Diverticulosis    Dizziness    patient had episode of dizziness when came in room. hx past no dx   GERD (gastroesophageal reflux disease)    Heart murmur    Hyperlipidemia 10/18/2017   PONV (postoperative nausea and vomiting)    yrs ago   Squamous cell carcinoma of skin 11/16/2016   L chest parasternal   Squamous cell carcinoma of skin 02/05/2020   L cheek inf to lat zygoma   Stroke ([place])    Family History  Problem Relation Age of Onset   Lung cancer Father    Aneurysm Brother    Stroke Brother    Diabetes Maternal Grandmother    Kidney disease Maternal Grandfather    Cushing syndrome Paternal Grandmother    Dementia Paternal Grandfather    Breast cancer Maternal Aunt 52   Past Surgical History:  Procedure Laterality Date   BACK SURGERY  12   BREAST BIOPSY Bilateral    cores "years ago"   BREAST EXCISIONAL BIOPSY Left 90's   CATARACT EXTRACTION W/PHACO Left 12/20/2018   Procedure: CATARACT EXTRACTION PHACO AND INTRAOCULAR LENS  PLACEMENT ([place]) LEFT TOPICAL;  Surgeon: Leandrew Koyanagi, MD;  Location: [place];  Service: Ophthalmology;  Laterality: Left;   CATARACT EXTRACTION W/PHACO Right 01/24/2019   Procedure: CATARACT EXTRACTION PHACO AND INTRAOCULAR LENS PLACEMENT ([place]) RIGHT;  Surgeon: Leandrew Koyanagi, MD;  Location: [place];  Service: Ophthalmology;  Laterality: Right;   CERVICAL DISC SURGERY  09   COLONOSCOPY WITH PROPOFOL N/A 01/13/2016   Procedure: COLONOSCOPY WITH PROPOFOL;  Surgeon: [name], MD;  Location: [facility] ENDOSCOPY;  Service: Endoscopy;  Laterality: N/A;   COLONOSCOPY WITH PROPOFOL N/A 02/19/2020   Procedure: COLONOSCOPY WITH PROPOFOL;  Surgeon: [name], MD;  Location: [facility] ENDOSCOPY;  Service: Gastroenterology;  Laterality: N/A;   FACELIFT  94   LOOP RECORDER INSERTION N/A 08/23/2017   Procedure: LOOP RECORDER INSERTION;  Surgeon: Thompson Grayer, MD;  Location: [facility];  Service: Cardiovascular;  Laterality: N/A;   TEE WITHOUT CARDIOVERSION N/A 08/12/2017   Procedure: TRANSESOPHAGEAL ECHOCARDIOGRAM (TEE);  Surgeon: Sueanne Margarita, MD;  Location: [facility] ENDOSCOPY;  Service: Cardiovascular;  Laterality: N/A;   TONSILLECTOMY     TOTAL HIP ARTHROPLASTY Right 04/22/2016   Procedure: TOTAL HIP ARTHROPLASTY;  Surgeon: Corky Mull, MD;  Location: [facility] ORS;  Service: Orthopedics;  Laterality: Right;   Patient Active Problem List  Diagnosis Date Noted   Leg swelling 07/13/2021   Intercostal neuralgia 04/16/2021   Multiple closed fractures of ribs of left side 04/16/2021   Adenoma of left adrenal gland 12/25/2020   Ischemic stroke ([place]) 12/10/2020   Chronic kidney disease, stage 3a ([place]) 12/09/2020   Chronic respiratory failure with hypoxia ([place]) 12/09/2020   Fall at home, initial encounter 12/09/2020   Right shoulder pain 12/09/2020   Memory deficit 09/24/2020   Pulmonary fibrosis ([place]) 09/14/2020   Chronic cough 09/14/2020   Arthritis of left sacroiliac joint  08/26/2020   Chronic left SI joint pain 08/26/2020   CKD (chronic kidney disease) 07/23/2020   HTN (hypertension) 04/23/2020   Positive fecal immunochemical test    Angiodysplasia of intestinal tract    Transient ischemic attack (TIA) 10/08/2019   Transient right leg weakness 09/30/2019   Peripheral neuropathy 08/10/2019   Primary osteoarthritis of left shoulder 07/24/2019   Elevated blood pressure reading 06/27/2019   Left shoulder pain 05/04/2019   Anemia 03/19/2019   Long term current use of opiate analgesic 01/09/2019   Foraminal stenosis of lumbar region 11/14/2018   Chronic pain syndrome 09/12/2018   SOB (shortness of breath) on exertion 08/11/2018   Dysphagia 08/11/2018   Cough 06/30/2018   Rash 01/16/2018   Hyperlipidemia 10/18/2017   History of lumbar surgery 10/04/2017   Depression, recurrent ([place]) 09/27/2017   Pulmonary nodules 09/27/2017   History of CVA (cerebrovascular accident) 09/12/2017   Cerebellar stroke, acute ([place]) 08/10/2017   Primary osteoarthritis of left hip 02/28/2017   Strain of left hip 02/28/2017   B12 deficiency 12/21/2016   Generalized OA 05/13/2016   Status post total replacement of right hip 04/22/2016   Personal history of colonic polyps    Greater trochanteric bursitis of both hips 01/09/2016   Prediabetes 08/04/2015   Thoracic aortic atherosclerosis ([place]) 06/16/2015   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   UTI (lower urinary tract infection) 11/22/2014   Low back pain 11/22/2014   Spondylolisthesis of lumbar region 11/18/2014   Lumbar radiculopathy 08/21/2014   Degeneration of lumbar or lumbosacral intervertebral disc 08/21/2014   Left hip pain 08/21/2014   Osteopenia 06/23/2014   Major depression in remission ([place]) 06/23/2014   Anxiety 06/23/2014   Menopause 06/23/2014      Prior to Admission medications   Medication Sig Start Date End Date Taking? Authorizing Provider  albuterol (VENTOLIN HFA) 108 (90 Base) MCG/ACT inhaler  TAKE 2 PUFFS BY MOUTH EVERY 6 HOURS AS NEEDED FOR WHEEZE OR SHORTNESS OF BREATH 02/06/21   Burnard Hawthorne, FNP  amLODipine (NORVASC) 5 MG tablet TAKE 1 TABLET BY MOUTH DAILY 07/15/21   Burnard Hawthorne, FNP  ASPIRIN LOW DOSE 81 MG EC tablet TAKE 1 TABLET (81 MG TOTAL) BY MOUTH DAILY FOR 21 DAYS. SWALLOW WHOLE. 03/27/21   Fayrene Helper, MD  atorvastatin (LIPITOR) 80 MG tablet Take 1 tablet (80 mg total) by mouth daily. 02/06/21   Burnard Hawthorne, FNP  buPROPion (WELLBUTRIN XL) 300 MG 24 hr tablet TAKE ONE TABLET BY MOUTH EVERY DAY 05/11/21   Burnard Hawthorne, FNP  clopidogrel (PLAVIX) 75 MG tablet Take 1 tablet (75 mg total) by mouth daily. 02/06/21   Burnard Hawthorne, FNP  DULoxetine (CYMBALTA) 30 MG capsule TAKE 1 CAPSULE BY MOUTH EVERY DAY 05/11/21   Burnard Hawthorne, FNP  famotidine (PEPCID) 20 MG tablet TAKE ONE TABLET BY MOUTH AT BEDTIME 05/11/21   Burnard Hawthorne, FNP  fluticasone (FLONASE) 50 MCG/ACT  nasal spray SPRAY 2 SPRAYS INTO EACH NOSTRIL EVERY DAY 02/06/21   Burnard Hawthorne, FNP  oxyCODONE-acetaminophen (PERCOCET) 5-325 MG tablet Take 1 tablet by mouth every 12 (twelve) hours as needed for severe pain. Must last 30 days. 07/22/21 08/21/21  Gillis Santa, MD  pregabalin (LYRICA) 50 MG capsule TAKE 1 CAPSULE (50 MG) BY MOUTH EVERY DAY AND TAKE 2 CAPSULES (100 MG) AT BEDTIME. Max daily dose '150mg'$ /day. Patient not taking: Reported on 07/21/2021 06/10/21   Gillis Santa, MD    Allergies Latex, Aleve [naproxen sodium], Aspirin, Celebrex [celecoxib], Effexor [venlafaxine], Gabapentin, Hydrocodone bit-homatrop mbr, Ibuprofen, Mobic [meloxicam], Tape, Vioxx [rofecoxib], Other, and Tramadol    Social History Social History   Tobacco Use   Smoking status: Former    Packs/day: 0.75    Years: 40.00    Pack years: 30.00    Types: Cigarettes    Quit date: 11/13/1996    Years since quitting: 24.7   Smokeless tobacco: Never  Vaping Use   Vaping Use: Never used  Substance Use Topics    Alcohol use: Yes    Comment: occ wine   Drug use: No    Review of Systems Patient denies headaches, rhinorrhea, blurry vision, numbness, shortness of breath, chest pain, edema, cough, abdominal pain, nausea, vomiting, diarrhea, dysuria, fevers, rashes or hallucinations unless otherwise stated above in HPI. ____________________________________________   PHYSICAL EXAM:  VITAL SIGNS: Vitals:   07/30/21 1630 07/30/21 1954  BP: 120/69 131/66  Pulse: 83 81  Resp: 17 20  Temp:  97.7 F (36.5 C)  SpO2: 97% 95%    Constitutional: Alert and oriented.  Eyes: Conjunctivae are normal.  Head: Atraumatic. Nose: No congestion/rhinnorhea. Mouth/Throat: Mucous membranes are moist.   Neck: No stridor. Painless ROM.  Cardiovascular: Normal rate, regular rhythm. Grossly normal heart sounds.  Good peripheral circulation. Respiratory: Normal respiratory effort.  No retractions. Lungs CTAB. Gastrointestinal: Soft and nontender. No distention. No abdominal bruits. No CVA tenderness. Genitourinary:  Musculoskeletal:   pain with log roll on left. No lower extremity tenderness nor edema.  No joint effusions. Neurologic:  Normal speech and language. No gross focal neurologic deficits are appreciated. No facial droop Skin:  Skin is warm, dry and intact. No rash noted. Psychiatric: Mood and affect are normal. Speech and behavior are normal.  ____________________________________________   LABS (all labs ordered are listed, but only abnormal results are displayed)  Results for orders placed or performed during the hospital encounter of 07/30/21 (from the past 24 hour(s))  CBC with Differential/Platelet     Status: Abnormal   Collection Time: 07/30/21  1:32 PM  Result Value Ref Range   WBC 7.0 4.0 - 10.5 K/uL   RBC 3.53 (L) 3.87 - 5.11 MIL/uL   Hemoglobin 10.0 (L) 12.0 - 15.0 g/dL   HCT 30.8 (L) 36.0 - 46.0 %   MCV 87.3 80.0 - 100.0 fL   MCH 28.3 26.0 - 34.0 pg   MCHC 32.5 30.0 - 36.0 g/dL    RDW 15.6 (H) 11.5 - 15.5 %   Platelets 278 150 - 400 K/uL   nRBC 0.0 0.0 - 0.2 %   Neutrophils Relative % 67 %   Neutro Abs 4.8 1.7 - 7.7 K/uL   Lymphocytes Relative 17 %   Lymphs Abs 1.2 0.7 - 4.0 K/uL   Monocytes Relative 9 %   Monocytes Absolute 0.6 0.1 - 1.0 K/uL   Eosinophils Relative 5 %   Eosinophils Absolute 0.4 0.0 - 0.5 K/uL  Basophils Relative 1 %   Basophils Absolute 0.1 0.0 - 0.1 K/uL   Immature Granulocytes 1 %   Abs Immature Granulocytes 0.04 0.00 - 0.07 K/uL  Comprehensive metabolic panel     Status: Abnormal   Collection Time: 07/30/21  1:32 PM  Result Value Ref Range   Sodium 139 135 - 145 mmol/L   Potassium 3.7 3.5 - 5.1 mmol/L   Chloride 105 98 - 111 mmol/L   CO2 27 22 - 32 mmol/L   Glucose, Bld 98 70 - 99 mg/dL   BUN 16 8 - 23 mg/dL   Creatinine, Ser 1.01 (H) 0.44 - 1.00 mg/dL   Calcium 8.8 (L) 8.9 - 10.3 mg/dL   Total Protein 6.8 6.5 - 8.1 g/dL   Albumin 3.2 (L) 3.5 - 5.0 g/dL   AST 18 15 - 41 U/L   ALT 11 0 - 44 U/L   Alkaline Phosphatase 59 38 - 126 U/L   Total Bilirubin 0.7 0.3 - 1.2 mg/dL   GFR, Estimated 56 (L) >60 mL/min   Anion gap 7 5 - 15  Troponin I (High Sensitivity)     Status: None   Collection Time: 07/30/21  1:32 PM  Result Value Ref Range   Troponin I (High Sensitivity) 10 <18 ng/L  Troponin I (High Sensitivity)     Status: None   Collection Time: 07/30/21  3:49 PM  Result Value Ref Range   Troponin I (High Sensitivity) 11 <18 ng/L   ____________________________________________  EKG My review and personal interpretation at Time: 13:59   Indication: weakness  Rate: 80  Rhythm: sinus Axis: normal Other: poor r wave progression ____________________________________________  RADIOLOGY  I personally reviewed all radiographic images ordered to evaluate for the above acute complaints and reviewed radiology reports and findings.  These findings were personally discussed with the patient.  Please see medical record for radiology  report.  ____________________________________________   PROCEDURES  Procedure(s) performed:  Procedures    Critical Care performed: no ____________________________________________   INITIAL IMPRESSION / ASSESSMENT AND PLAN / ED COURSE  Pertinent labs & imaging results that were available during my care of the patient were reviewed by me and considered in my medical decision making (see chart for details).   DDX: cva, fracture, electrolyte abn, dehydration, uti  Melody Ochoa is a 80 y.o. who presents to the ED with presentation as described above.  Symptoms are Primarily related to left hip pain.  Describing weakness that started 4 days ago.  Possible CVA but she does not have any focal deficits at this time.  Episode of slurred speech earlier this morning possible TIA with C/t with head with chronic findings.  X-ray with severe osteoarthritis.  Will check blood work.  Will order MRI for further evaluation.  She is otherwise nontoxic-appearing.  States that she did take 2 oxycodones yesterday night and this morning may have a component of polypharmacy.  Clinical Course as of 07/30/21 1957  Thu Jul 30, 2021  1452 DG Chest 1 View [PR]  [phone] Patient reassessed.  Agrees with plan for waiting for MRI.  Patient will be signed out to oncoming physician pending follow-up MRI urinalysis and reassessment. [PR]    Clinical Course User Index [PR] Merlyn Lot, MD    The patient was evaluated in Emergency Department today for the symptoms described in the history of present illness. He/she was evaluated in the context of the global COVID-19 pandemic, which necessitated consideration that the patient might be at  risk for infection with the SARS-CoV-2 virus that causes COVID-19. Institutional protocols and algorithms that pertain to the evaluation of patients at risk for COVID-19 are in a state of rapid change based on information released by regulatory bodies including the CDC and federal  and state organizations. These policies and algorithms were followed during the patient's care in the ED.  As part of my medical decision making, I reviewed the following data within the [place] notes reviewed and incorporated, Labs reviewed, notes from prior ED visits and [place] Controlled Substance Database   ____________________________________________   FINAL CLINICAL IMPRESSION(S) / ED DIAGNOSES  Final diagnoses:  Acute hip pain, left  Weakness      NEW MEDICATIONS STARTED DURING THIS VISIT:  New Prescriptions   No medications on file     Note:  This document was prepared using Dragon voice recognition software and may include unintentional dictation errors.    Merlyn Lot, MD 07/30/21 [phone]

## 2021-07-31 DIAGNOSIS — M1612 Unilateral primary osteoarthritis, left hip: Secondary | ICD-10-CM | POA: Diagnosis not present

## 2021-07-31 DIAGNOSIS — E785 Hyperlipidemia, unspecified: Secondary | ICD-10-CM

## 2021-07-31 DIAGNOSIS — R262 Difficulty in walking, not elsewhere classified: Secondary | ICD-10-CM

## 2021-07-31 DIAGNOSIS — M25552 Pain in left hip: Secondary | ICD-10-CM | POA: Diagnosis not present

## 2021-07-31 DIAGNOSIS — W19XXXA Unspecified fall, initial encounter: Secondary | ICD-10-CM

## 2021-07-31 DIAGNOSIS — R9431 Abnormal electrocardiogram [ECG] [EKG]: Secondary | ICD-10-CM | POA: Diagnosis not present

## 2021-07-31 LAB — CBC
HCT: 31.2 % — ABNORMAL LOW (ref 36.0–46.0)
Hemoglobin: 9.7 g/dL — ABNORMAL LOW (ref 12.0–15.0)
MCH: 27 pg (ref 26.0–34.0)
MCHC: 31.1 g/dL (ref 30.0–36.0)
MCV: 86.9 fL (ref 80.0–100.0)
Platelets: 265 10*3/uL (ref 150–400)
RBC: 3.59 MIL/uL — ABNORMAL LOW (ref 3.87–5.11)
RDW: 15.4 % (ref 11.5–15.5)
WBC: 5.8 10*3/uL (ref 4.0–10.5)
nRBC: 0 % (ref 0.0–0.2)

## 2021-07-31 LAB — BASIC METABOLIC PANEL
Anion gap: 6 (ref 5–15)
BUN: 15 mg/dL (ref 8–23)
CO2: 29 mmol/L (ref 22–32)
Calcium: 8.4 mg/dL — ABNORMAL LOW (ref 8.9–10.3)
Chloride: 109 mmol/L (ref 98–111)
Creatinine, Ser: 0.89 mg/dL (ref 0.44–1.00)
GFR, Estimated: >60 mL/min (ref >60)
Glucose, Bld: 85 mg/dL (ref 70–99)
Potassium: 3.5 mmol/L (ref 3.5–5.1)
Sodium: 144 mmol/L (ref 135–145)

## 2021-07-31 LAB — C-REACTIVE PROTEIN: CRP: 0.8 mg/dL (ref <1.0)

## 2021-07-31 LAB — SEDIMENTATION RATE: Sed Rate: 60 mm/hr — ABNORMAL HIGH (ref 0–30)

## 2021-07-31 LAB — SARS CORONAVIRUS 2 (TAT 6-24 HRS): SARS Coronavirus 2: NEGATIVE

## 2021-07-31 MED ORDER — DULOXETINE HCL 30 MG PO CPEP
30.0000 mg | ORAL_CAPSULE | Freq: Every day | ORAL | Status: DC
  Administered 2021-07-31 – 2021-08-11 (×11): 30 mg via ORAL
  Filled 2021-07-31 (×13): qty 1

## 2021-07-31 MED ORDER — SODIUM CHLORIDE 0.9 % IV SOLN: INTRAVENOUS | Status: DC

## 2021-07-31 MED ORDER — ACETAMINOPHEN 650 MG RE SUPP: 650.0000 mg | Freq: Four times a day (QID) | RECTAL | Status: DC | PRN

## 2021-07-31 MED ORDER — ENOXAPARIN SODIUM 40 MG/0.4ML IJ SOSY
40.0000 mg | PREFILLED_SYRINGE | INTRAMUSCULAR | Status: DC
  Administered 2021-07-31 – 2021-08-04 (×5): 40 mg via SUBCUTANEOUS
  Filled 2021-07-31 (×5): qty 0.4

## 2021-07-31 MED ORDER — TRAZODONE HCL 50 MG PO TABS
25.0000 mg | ORAL_TABLET | Freq: Every evening | ORAL | Status: DC | PRN
  Administered 2021-08-01 – 2021-08-07 (×3): 25 mg via ORAL
  Filled 2021-07-31 (×3): qty 1

## 2021-07-31 MED ORDER — BUPROPION HCL ER (XL) 150 MG PO TB24
300.0000 mg | ORAL_TABLET | Freq: Every day | ORAL | Status: DC
  Administered 2021-07-31 – 2021-08-11 (×11): 300 mg via ORAL
  Filled 2021-07-31 (×11): qty 2

## 2021-07-31 MED ORDER — ALBUTEROL SULFATE (2.5 MG/3ML) 0.083% IN NEBU: 3.0000 mL | INHALATION_SOLUTION | Freq: Four times a day (QID) | RESPIRATORY_TRACT | Status: DC | PRN

## 2021-07-31 MED ORDER — CLOPIDOGREL BISULFATE 75 MG PO TABS
75.0000 mg | ORAL_TABLET | Freq: Every day | ORAL | Status: DC
  Administered 2021-07-31 – 2021-08-04 (×5): 75 mg via ORAL
  Filled 2021-07-31 (×5): qty 1

## 2021-07-31 MED ORDER — PREGABALIN 50 MG PO CAPS
50.0000 mg | ORAL_CAPSULE | Freq: Every day | ORAL | Status: DC
  Administered 2021-07-31 – 2021-08-07 (×6): 50 mg via ORAL
  Filled 2021-07-31: qty 2
  Filled 2021-07-31 (×5): qty 1

## 2021-07-31 MED ORDER — ONDANSETRON HCL 4 MG PO TABS: 4.0000 mg | ORAL_TABLET | Freq: Four times a day (QID) | ORAL | Status: DC | PRN

## 2021-07-31 MED ORDER — ONDANSETRON HCL 4 MG/2ML IJ SOLN: 4.0000 mg | Freq: Four times a day (QID) | INTRAMUSCULAR | Status: DC | PRN

## 2021-07-31 MED ORDER — PREGABALIN 25 MG PO CAPS: 50.0000 mg | ORAL_CAPSULE | ORAL | Status: DC

## 2021-07-31 MED ORDER — ENSURE ENLIVE PO LIQD
237.0000 mL | Freq: Three times a day (TID) | ORAL | Status: DC
  Administered 2021-07-31 – 2021-08-11 (×23): 237 mL via ORAL

## 2021-07-31 MED ORDER — OXYCODONE-ACETAMINOPHEN 5-325 MG PO TABS: 1.0000 | ORAL_TABLET | Freq: Two times a day (BID) | ORAL | Status: DC | PRN

## 2021-07-31 MED ORDER — MAGNESIUM HYDROXIDE 400 MG/5ML PO SUSP
30.0000 mL | Freq: Every day | ORAL | Status: DC | PRN
  Administered 2021-08-06 – 2021-08-08 (×2): 30 mL via ORAL
  Filled 2021-07-31 (×2): qty 30

## 2021-07-31 MED ORDER — AMLODIPINE BESYLATE 5 MG PO TABS
5.0000 mg | ORAL_TABLET | Freq: Every day | ORAL | Status: DC
  Administered 2021-07-31 – 2021-08-04 (×5): 5 mg via ORAL
  Filled 2021-07-31 (×5): qty 1

## 2021-07-31 MED ORDER — ASPIRIN EC 81 MG PO TBEC
81.0000 mg | DELAYED_RELEASE_TABLET | Freq: Every day | ORAL | Status: DC
  Administered 2021-07-31 – 2021-08-11 (×11): 81 mg via ORAL
  Filled 2021-07-31 (×11): qty 1

## 2021-07-31 MED ORDER — FAMOTIDINE 20 MG PO TABS
20.0000 mg | ORAL_TABLET | Freq: Every day | ORAL | Status: DC
  Administered 2021-07-31 – 2021-08-10 (×11): 20 mg via ORAL
  Filled 2021-07-31 (×11): qty 1

## 2021-07-31 MED ORDER — ADULT MULTIVITAMIN W/MINERALS CH
1.0000 | ORAL_TABLET | Freq: Every day | ORAL | Status: DC
  Administered 2021-07-31 – 2021-08-11 (×11): 1 via ORAL
  Filled 2021-07-31 (×10): qty 1

## 2021-07-31 MED ORDER — ATORVASTATIN CALCIUM 20 MG PO TABS
80.0000 mg | ORAL_TABLET | Freq: Every day | ORAL | Status: DC
  Administered 2021-07-31 – 2021-08-11 (×11): 80 mg via ORAL
  Filled 2021-07-31 (×12): qty 4

## 2021-07-31 MED ORDER — FLUTICASONE PROPIONATE 50 MCG/ACT NA SUSP
2.0000 | Freq: Every day | NASAL | Status: DC
  Administered 2021-08-01 – 2021-08-09 (×6): 2 via NASAL
  Filled 2021-07-31: qty 16

## 2021-07-31 MED ORDER — MORPHINE SULFATE (PF) 2 MG/ML IV SOLN
1.0000 mg | INTRAVENOUS | Status: DC | PRN
  Administered 2021-07-31 – 2021-08-03 (×5): 2 mg via INTRAVENOUS
  Filled 2021-07-31 (×5): qty 1

## 2021-07-31 MED ORDER — ACETAMINOPHEN 325 MG PO TABS
650.0000 mg | ORAL_TABLET | Freq: Four times a day (QID) | ORAL | Status: DC | PRN
  Administered 2021-07-31: 650 mg via ORAL
  Filled 2021-07-31: qty 2

## 2021-07-31 MED ORDER — PREGABALIN 50 MG PO CAPS
100.0000 mg | ORAL_CAPSULE | Freq: Every day | ORAL | Status: DC
  Administered 2021-07-31 – 2021-08-10 (×11): 100 mg via ORAL
  Filled 2021-07-31 (×11): qty 2

## 2021-07-31 NOTE — Progress Notes (Signed)
Rings, clothes, cell phone and glasses given to patient and transported with her to room.

## 2021-07-31 NOTE — Progress Notes (Signed)
Initial Nutrition Assessment  DOCUMENTATION CODES:  Not applicable  INTERVENTION:  Obtain updated weight.  Add Magic cup TID with meals, each supplement provides 290 kcal and 9 grams of protein.  Add Ensure Plus po TID, each supplement provides 350 kcal and 13 grams of protein.   Add MVI with minerals daily.  Encourage PO intake.  NUTRITION DIAGNOSIS:  Inadequate oral intake related to decreased appetite as evidenced by per patient/family report, meal completion < 25%.  GOAL:  Patient will meet greater than or equal to 90% of their needs  MONITOR:  PO intake, Supplement acceptance, Labs, Weight trends, I & O's  REASON FOR ASSESSMENT:  Malnutrition Screening Tool    ASSESSMENT:  80 yo female with a PMH of depression, dyslipidemia, interstitial lung disease, GERD, diverticulosis, chronic back pain, who presents to the emergency room with acute onset of accidental mechanical fall on her right shoulder followed by landing on her buttock and left hip.  She had subsequent left hip pain. The patient has not been able to ambulate since her fall. The patient will likely need left total hip arthroplasty in the past given her severe degenerative joint disease  Spoke with pt at bedside. Pt reports that she has had been eating poorly recently, and is unsure of why she has no appetite. Explained that appetite changes can occur with age. She reports her and her husband like to go out to eat and do not cook as much at home anymore - but she wants to start cooking again because she misses it.  RD also discussed the use of ONS and how those can add calories and protein to a diet if patient does not feel like eating foods or eating enough.  Per Epic, pt ate only 20% of lunch today. She reports looking forward to dinner.  Pt's weight appears to be copied from previous admissions and appears stated. RD to order new measured weight to determine recent changes.   On exam, pt with no significant  depletions.  Recommend adding Ensure Plus TID, Magic Cup TID, and MVI with minerals daily.  Medications: reviewed; Pepcid, NaCl @ 75 ml/hr via IV, morphine PRN (given twice today)  Labs: reviewed  NUTRITION - FOCUSED PHYSICAL EXAM: Flowsheet Row Most Recent Value  Orbital Region Mild depletion  Upper Arm Region No depletion  Thoracic and Lumbar Region No depletion  Buccal Region Mild depletion  Temple Region Mild depletion  Clavicle Bone Region No depletion  Clavicle and Acromion Bone Region No depletion  Scapular Bone Region No depletion  Dorsal Hand No depletion  Patellar Region No depletion  Anterior Thigh Region No depletion  Posterior Calf Region No depletion  Edema (RD Assessment) Mild  [LLE]  Hair Reviewed  Eyes Reviewed  Mouth Reviewed  Skin Reviewed  Nails Reviewed   Diet Order:   Diet Order             Diet Heart Room service appropriate? Yes; Fluid consistency: Thin  Diet effective now                  EDUCATION NEEDS:  Education needs have been addressed  Skin:  Skin Assessment: Reviewed RN Assessment (Ecchymosis, Abrasion)  Last BM:  07/29/21  Height:  Ht Readings from Last 1 Encounters:  07/30/21 '5\' 4"'$  (1.626 m)   Weight:  Wt Readings from Last 1 Encounters:  07/30/21 81.6 kg   BMI:  Body mass index is 30.9 kg/m.  Estimated Nutritional Needs:  Kcal:  [id number]  Protein:  80-95 grams Fluid:  >1.65 L  Derrel Nip, RD, LDN (she/her/hers) Registered Dietitian I After-Hours/Weekend Pager # in [place]

## 2021-07-31 NOTE — H&P (Signed)
[place]   PATIENT NAME: Melody Ochoa    MR#:  [id number]  DATE OF BIRTH:  [birth year]  DATE OF ADMISSION:  07/30/2021  PRIMARY CARE PHYSICIAN: Burnard Hawthorne, FNP   Patient is coming from: Home  REQUESTING/REFERRING PHYSICIAN: Duanne Guess, PA-C  CHIEF COMPLAINT:   Chief Complaint  Patient presents with  . Fall    HISTORY OF PRESENT ILLNESS:  JARETZI KAUT is a 80 y.o. female with medical history significant for depression, dyslipidemia, interstitial lung disease, GERD, diverticulosis, chronic back pain, who presents to the emergency room with acute onset of accidental mechanical fall on her right shoulder followed by landing on her buttock and left hip.  She had subsequent left hip pain.  No presyncope or syncope.  No chest pain or dyspnea or palpitations.  She denies any cough or wheezing.  No dysuria, oliguria or hematuria or flank pain.  The patient has not been able to ambulate since her fall.  ED Course: Upon presentation to the ER, vital signs are within normal.  Labs revealed unremarkable CMP.  CBC showed anemia close to previous levels.  Sed rate was elevated at 60.  High-sensitivity troponin was 10 and later 11.  Urinalysis was unremarkable.  Respiratory panel is currently pending. EKG as reviewed by me : Showed normal sinus rhythm with a rate of 79 with prolonged QT interval with QTC of 509 MS. Imaging: Chest x-ray showed cardiomegaly and aortic atherosclerosis, chronic fibrotic lung disease and healing rib fractures on the left with no acute findings otherwise.  Hip x-ray showed the following: 1. Osteopenia. No displaced hip or pelvic fracture. Please note that plain radiographs are significantly insensitive for hip and pelvic fracture, particularly in the setting of osteopenia. Consider CT or MRI to more sensitively evaluate.   2.  Severe, bone-on-bone arthrosis of the left hip.   3.  Status post right hip total arthroplasty.   Noncontrasted  head CT scan and C-spine CT scan showed: 1. No acute intracranial findings. 2. Remote cerebral cortical infarctions unchanged. 3. No cervical spine fracture. 4. Posterior cervical fusion stable. 5. Multilevel disc osteophytic disease. 6. Severe interstitial lung disease lung apices.  Left hip MRI showed severe left hip degenerative arthritis with complete loss of the joint space, remodeling of the femoral head and extensive marrow edema within the superior acetabulum and proximal left femur with moderate left hip effusion and extensive soft tissue swelling within the adjacent soft tissues.  It showed degenerative changes have markedly progressed when compared to CT on 04/17/2018.  Given rapid progression and extent of surrounding inflammatory change, septic arthritis should be considered and excluded.  It showed a right total hip arthroplasty and extensive edema within the gluteal and adductor musculature..  The patient will be admitted to a med observation medical bed for further evaluation and management  PAST MEDICAL HISTORY:   Past Medical History:  Diagnosis Date  . Arthritis   . Atheromatous plaque 10/18/2017  . Back pain   . Cancer ([place])    skin  . Depression   . Diverticulosis   . Dizziness    patient had episode of dizziness when came in room. hx past no dx  . GERD (gastroesophageal reflux disease)   . Heart murmur   . Hyperlipidemia 10/18/2017  . PONV (postoperative nausea and vomiting)    yrs ago  . Squamous cell carcinoma of skin 11/16/2016   L chest parasternal  . Squamous cell carcinoma of skin  02/05/2020   L cheek inf to lat zygoma  . Stroke [facility])     PAST SURGICAL HISTORY:   Past Surgical History:  Procedure Laterality Date  . BACK SURGERY  13  . BREAST BIOPSY Bilateral    cores "years ago"  . BREAST EXCISIONAL BIOPSY Left 90's  . CATARACT EXTRACTION W/PHACO Left 12/20/2018   Procedure: CATARACT EXTRACTION PHACO AND INTRAOCULAR LENS PLACEMENT ([place]) LEFT  TOPICAL;  Surgeon: Leandrew Koyanagi, MD;  Location: [place];  Service: Ophthalmology;  Laterality: Left;  . CATARACT EXTRACTION W/PHACO Right 01/24/2019   Procedure: CATARACT EXTRACTION PHACO AND INTRAOCULAR LENS PLACEMENT ([place]) RIGHT;  Surgeon: Leandrew Koyanagi, MD;  Location: [place];  Service: Ophthalmology;  Laterality: Right;  . CERVICAL DISC SURGERY  09  . COLONOSCOPY WITH PROPOFOL N/A 01/13/2016   Procedure: COLONOSCOPY WITH PROPOFOL;  Surgeon: [name], MD;  Location: [facility] ENDOSCOPY;  Service: Endoscopy;  Laterality: N/A;  . COLONOSCOPY WITH PROPOFOL N/A 02/19/2020   Procedure: COLONOSCOPY WITH PROPOFOL;  Surgeon: [name], MD;  Location[facility] ENDOSCOPY;  Service: Gastroenterology;  Laterality: N/A;  . FACELIFT  94  . LOOP RECORDER INSERTION N/A 08/23/2017   Procedure: LOOP RECORDER INSERTION;  Surgeon: Thompson Grayer, MD;  Location: [place] CV LAB;  Service: Cardiovascular;  Laterality: N/A;  . TEE WITHOUT CARDIOVERSION N/A 08/12/2017   Procedure: TRANSESOPHAGEAL ECHOCARDIOGRAM (TEE);  Surgeon: Sueanne Margarita, MD;  Location: [facility] ENDOSCOPY;  Service: Cardiovascular;  Laterality: N/A;  . TONSILLECTOMY    . TOTAL HIP ARTHROPLASTY Right 04/22/2016   Procedure: TOTAL HIP ARTHROPLASTY;  Surgeon: Corky Mull, MD;  Location: [facility] ORS;  Service: Orthopedics;  Laterality: Right;    SOCIAL HISTORY:   Social History   Tobacco Use  . Smoking status: Former    Packs/day: 0.75    Years: 40.00    Pack years: 30.00    Types: Cigarettes    Quit date: 11/13/1996    Years since quitting: 24.7  . Smokeless tobacco: Never  Substance Use Topics  . Alcohol use: Yes    Comment: occ wine    FAMILY HISTORY:   Family History  Problem Relation Age of Onset  . Lung cancer Father   . Aneurysm Brother   . Stroke Brother   . Diabetes Maternal Grandmother   . Kidney disease Maternal Grandfather   . Cushing syndrome Paternal Grandmother   . Dementia Paternal  Grandfather   . Breast cancer Maternal Aunt 52    DRUG ALLERGIES:   Allergies  Allergen Reactions  . Latex Itching  . Aleve [Naproxen Sodium] Other (See Comments)    Gi upset  . Aspirin Other (See Comments)    Stomach pain-aggravates diverticulosis  . Celebrex [Celecoxib] Other (See Comments)    Dizziness   . Effexor [Venlafaxine] Other (See Comments)    Hot flashes   . Gabapentin Other (See Comments)    "Night terrors"  . Hydrocodone Bit-Homatrop Mbr Diarrhea  . Ibuprofen Other (See Comments)    Gi upset   . Mobic [Meloxicam]     Stomach upset  . Tape Itching and Other (See Comments)    Itchy blisters  . Vioxx [Rofecoxib] Other (See Comments)    Gi upset  . Other Rash and Other (See Comments)    bandaides  . Tramadol Other (See Comments)    hallucinations    REVIEW OF SYSTEMS:   ROS As per history of present illness. All pertinent systems were reviewed above. Constitutional, HEENT, cardiovascular, respiratory, GI,  GU, musculoskeletal, neuro, psychiatric, endocrine, integumentary and hematologic systems were reviewed and are otherwise negative/unremarkable except for positive findings mentioned above in the HPI.   MEDICATIONS AT HOME:   Prior to Admission medications   Medication Sig Start Date End Date Taking? Authorizing Provider  albuterol (VENTOLIN HFA) 108 (90 Base) MCG/ACT inhaler TAKE 2 PUFFS BY MOUTH EVERY 6 HOURS AS NEEDED FOR WHEEZE OR SHORTNESS OF BREATH 02/06/21  Yes Burnard Hawthorne, FNP  amLODipine (NORVASC) 5 MG tablet TAKE 1 TABLET BY MOUTH DAILY 07/15/21  Yes Arnett, Yvetta Coder, FNP  ASPIRIN LOW DOSE 81 MG EC tablet TAKE 1 TABLET (81 MG TOTAL) BY MOUTH DAILY FOR 21 DAYS. SWALLOW WHOLE. 03/27/21  Yes Fayrene Helper, MD  atorvastatin (LIPITOR) 80 MG tablet Take 1 tablet (80 mg total) by mouth daily. 02/06/21  Yes Burnard Hawthorne, FNP  buPROPion (WELLBUTRIN XL) 300 MG 24 hr tablet TAKE ONE TABLET BY MOUTH EVERY DAY 05/11/21  Yes Burnard Hawthorne,  FNP  clopidogrel (PLAVIX) 75 MG tablet Take 1 tablet (75 mg total) by mouth daily. 02/06/21  Yes Burnard Hawthorne, FNP  DULoxetine (CYMBALTA) 30 MG capsule TAKE 1 CAPSULE BY MOUTH EVERY DAY 05/11/21  Yes Burnard Hawthorne, FNP  famotidine (PEPCID) 20 MG tablet TAKE ONE TABLET BY MOUTH AT BEDTIME 05/11/21  Yes Burnard Hawthorne, FNP  fluticasone (FLONASE) 50 MCG/ACT nasal spray SPRAY 2 SPRAYS INTO EACH NOSTRIL EVERY DAY 02/06/21  Yes Burnard Hawthorne, FNP  oxyCODONE-acetaminophen (PERCOCET) 5-325 MG tablet Take 1 tablet by mouth every 12 (twelve) hours as needed for severe pain. Must last 30 days. 07/22/21 08/21/21 Yes Gillis Santa, MD  pregabalin (LYRICA) 50 MG capsule TAKE 1 CAPSULE (50 MG) BY MOUTH EVERY DAY AND TAKE 2 CAPSULES (100 MG) AT BEDTIME. Max daily dose '150mg'$ /day. 06/10/21  Yes Gillis Santa, MD      VITAL SIGNS:  Blood pressure 131/66, pulse 81, temperature 97.7 F (36.5 C), temperature source Oral, resp. rate 20, height '5\' 4"'$  (1.626 m), weight 81.6 kg, SpO2 95 %.  PHYSICAL EXAMINATION:  Physical Exam  GENERAL:  80 y.o.-year-old Caucasian female patient lying in the bed with no acute distress.  EYES: Pupils equal, round, reactive to light and accommodation. No scleral icterus. Extraocular muscles intact.  HEENT: Head atraumatic, normocephalic. Oropharynx and nasopharynx clear.  NECK:  Supple, no jugular venous distention. No thyroid enlargement, no tenderness.  LUNGS: Normal breath sounds bilaterally, no wheezing, rales,rhonchi or crepitation. No use of accessory muscles of respiration.  CARDIOVASCULAR: Regular rate and rhythm, S1, S2 normal. No murmurs, rubs, or gallops.  ABDOMEN: Soft, nondistended, nontender. Bowel sounds present. No organomegaly or mass.  EXTREMITIES: No pedal edema, cyanosis, or clubbing.  NEUROLOGIC: Cranial nerves II through XII are intact. Muscle strength 5/5 in all extremities. Sensation intact. Gait not checked. Musculoskeletal: Left lateral hip  tenderness. PSYCHIATRIC: The patient is alert and oriented x 3.  Normal affect and good eye contact. SKIN: No obvious rash, lesion, or ulcer.   LABORATORY PANEL:   CBC Recent Labs  Lab 07/30/21 1332  WBC 7.0  HGB 10.0*  HCT 30.8*  PLT 278   ------------------------------------------------------------------------------------------------------------------  Chemistries  Recent Labs  Lab 07/30/21 1332  NA 139  K 3.7  CL 105  CO2 27  GLUCOSE 98  BUN 16  CREATININE 1.01*  CALCIUM 8.8*  AST 18  ALT 11  ALKPHOS 59  BILITOT 0.7   ------------------------------------------------------------------------------------------------------------------  Cardiac Enzymes No results for input(s): TROPONINI in  the last 168 hours. ------------------------------------------------------------------------------------------------------------------  RADIOLOGY:  DG Chest 1 View  Result Date: 07/30/2021 CLINICAL DATA:  [place] from toilet. EXAM: CHEST  1 VIEW COMPARISON:  12/21/2020 FINDINGS: Mild cardiomegaly. Chronic aortic atherosclerosis and tortuosity. Chronic loop recorder. Chronic interstitial pulmonary scarring. Chronic elevation of the right hemidiaphragm. No sign of acute in scratch set no sign of acute infiltrate, mass or collapse. There are healing fractures of the lateral seventh, eighth and ninth ribs. IMPRESSION: No active/acute finding. Cardiomegaly and aortic atherosclerosis. Chronic fibrotic lung disease. Healing rib fractures on the left. Electronically Signed   By: Nelson Chimes M.D.   On: 07/30/2021 14:27   CT HEAD WO CONTRAST (5MM)  Result Date: 07/30/2021 CLINICAL DATA:  Neuro deficit.  Acute stroke suspected. EXAM: CT HEAD WITHOUT CONTRAST CT CERVICAL SPINE WITHOUT CONTRAST TECHNIQUE: Multidetector CT imaging of the head and cervical spine was performed following the standard protocol without intravenous contrast. Multiplanar CT image reconstructions of the cervical spine were  also generated. COMPARISON:  Head CT 06/29/2021 FINDINGS: CT HEAD FINDINGS Brain: No acute intracranial hemorrhage. No focal mass lesion. No CT evidence of acute infarction. No midline shift or mass effect. No hydrocephalus. Basilar cisterns are patent. Remote cortical hypodense infarctions in the anterior RIGHT frontal lobe and posterior LEFT parietal lobe. There are periventricular and subcortical white matter hypodensities. Generalized cortical atrophy. Vascular: No hyperdense vessel or unexpected calcification. Skull: Normal. Negative for fracture or focal lesion. Sinuses/Orbits: Paranasal sinuses and mastoid air cells are clear. Orbits are clear. Other: None. CT CERVICAL SPINE FINDINGS Alignment: Normal alignment of the cervical vertebral bodies. Skull base and vertebrae: Normal craniocervical junction. No loss of vertebral body height or disc height. Normal facet articulation. No evidence of fracture. Soft tissues and spinal canal: No prevertebral soft tissue swelling. No perispinal or epidural hematoma. Disc levels: There is disc space narrowing from C5-T1. Anterior endplate osteophytosis. Posterior fusion at C5-C6. Upper chest: Severe interstitial lung disease in the upper lobes. Other: None IMPRESSION: 1. No acute intracranial findings. 2. Remote cerebral cortical infarctions unchanged. 3. No cervical spine fracture. 4. Posterior cervical fusion stable. 5. Multilevel disc osteophytic disease. 6. Severe interstitial lung disease lung apices Electronically Signed   By: Suzy Bouchard M.D.   On: 07/30/2021 15:07   CT Cervical Spine Wo Contrast  Result Date: 07/30/2021 CLINICAL DATA:  Neuro deficit.  Acute stroke suspected. EXAM: CT HEAD WITHOUT CONTRAST CT CERVICAL SPINE WITHOUT CONTRAST TECHNIQUE: Multidetector CT imaging of the head and cervical spine was performed following the standard protocol without intravenous contrast. Multiplanar CT image reconstructions of the cervical spine were also  generated. COMPARISON:  Head CT 06/29/2021 FINDINGS: CT HEAD FINDINGS Brain: No acute intracranial hemorrhage. No focal mass lesion. No CT evidence of acute infarction. No midline shift or mass effect. No hydrocephalus. Basilar cisterns are patent. Remote cortical hypodense infarctions in the anterior RIGHT frontal lobe and posterior LEFT parietal lobe. There are periventricular and subcortical white matter hypodensities. Generalized cortical atrophy. Vascular: No hyperdense vessel or unexpected calcification. Skull: Normal. Negative for fracture or focal lesion. Sinuses/Orbits: Paranasal sinuses and mastoid air cells are clear. Orbits are clear. Other: None. CT CERVICAL SPINE FINDINGS Alignment: Normal alignment of the cervical vertebral bodies. Skull base and vertebrae: Normal craniocervical junction. No loss of vertebral body height or disc height. Normal facet articulation. No evidence of fracture. Soft tissues and spinal canal: No prevertebral soft tissue swelling. No perispinal or epidural hematoma. Disc levels: There is disc space narrowing from C5-T1. Anterior  endplate osteophytosis. Posterior fusion at C5-C6. Upper chest: Severe interstitial lung disease in the upper lobes. Other: None IMPRESSION: 1. No acute intracranial findings. 2. Remote cerebral cortical infarctions unchanged. 3. No cervical spine fracture. 4. Posterior cervical fusion stable. 5. Multilevel disc osteophytic disease. 6. Severe interstitial lung disease lung apices Electronically Signed   By: Suzy Bouchard M.D.   On: 07/30/2021 15:07   MR BRAIN WO CONTRAST  Result Date: 07/30/2021 CLINICAL DATA:  Initial evaluation for acute weakness. EXAM: MRI HEAD WITHOUT CONTRAST TECHNIQUE: Multiplanar, multiecho pulse sequences of the brain and surrounding structures were obtained without intravenous contrast. COMPARISON:  CT from earlier the same day. FINDINGS: Brain: Diffuse prominence of the CSF containing spaces generalized age-related  cerebral atrophy. Mild for age chronic microvascular ischemic disease seen involving the periventricular and deep white matter both cerebral hemispheres. Multiple scattered remote cortical and subcortical infarcts noted involving the right frontal lobe, right parietal lobe, and left parietal lobe. Small remote left cerebellar infarct noted. Mild chronic hemosiderin staining seen above a few of these infarcts. No abnormal foci of restricted diffusion to suggest or subacute ischemia. Gray-white matter differentiation otherwise maintained. No acute intracranial hemorrhage. Single punctate chronic microhemorrhage noted at the posterior right frontoparietal region, of doubtful significance in isolation. No mass lesion, midline shift or mass effect. Mild ventricular prominence related global parenchymal volume loss without hydrocephalus. No extra-axial fluid collection. Pituitary gland and suprasellar region normal. Midline structures intact and normal. Vascular: Major intracranial vascular flow voids are maintained. Skull and upper cervical spine: Craniocervical junction within normal limits. Bone marrow signal intensity normal. No scalp soft tissue abnormality. Sinuses/Orbits: Patient status post bilateral ocular lens replacement. Globes and orbital soft tissues demonstrate no acute finding. Paranasal sinuses are largely clear. No significant mastoid effusion. Inner ear structures grossly normal. Other: None. IMPRESSION: 1. No acute intracranial abnormality. 2. Multiple scattered remote infarcts involving the right frontal lobe, right parietal lobe, left parietal lobe, and left cerebellum. 3. Underlying age-related cerebral atrophy with mild chronic small vessel ischemic disease. Electronically Signed   By: Jeannine Boga M.D.   On: 07/30/2021 21:57   DG Hip Unilat W or Wo Pelvis 2-3 Views Left  Result Date: 07/30/2021 CLINICAL DATA:  Fall, left hip pain EXAM: DG HIP (WITH OR WITHOUT PELVIS) 2-3V LEFT  COMPARISON:  None. FINDINGS: Osteopenia. No displaced hip or pelvic fracture. Status post right hip total arthroplasty. Severe, bone-on-bone superior joint space loss of the left hip. Nonobstructive pattern of overlying bowel gas. IMPRESSION: 1. Osteopenia. No displaced hip or pelvic fracture. Please note that plain radiographs are significantly insensitive for hip and pelvic fracture, particularly in the setting of osteopenia. Consider CT or MRI to more sensitively evaluate. 2.  Severe, bone-on-bone arthrosis of the left hip. 3.  Status post right hip total arthroplasty. Electronically Signed   By: Eddie Candle M.D.   On: 07/30/2021 14:25      IMPRESSION AND PLAN:  Active Problems:   Left hip pain  1.  Mechanical fall with subsequent left hip pain and inability to ambulate. - The patient will be placed observation in the medical bed. - Pain management will be provided. - Physical therapy consult be obtained. - The patient will likely need left total hip arthroplasty in the past given her severe degenerative joint disease.  2.  Dyslipidemia. - We will continue statin therapy.  3.  Depression. - We will continue Wellbutrin XL.  4.  GERD. - We will continue H2 blocker therapy.  5.  Peripheral neuropathy. - We will continue her Lyrica.  6.  Interstitial lung disease. - We will continue her albuterol.   DVT prophylaxis: Lovenox.  Code Status: full code.  Family Communication:  The plan of care was discussed in details with the patient (and family). I answered all questions. The patient agreed to proceed with the above mentioned plan. Further management will depend upon hospital course. Disposition Plan: Back to previous home environment Consults called: none.  All the records are reviewed and case discussed with ED provider.  Status is: Observation  The patient remains OBS appropriate and will d/c before 2 midnights.  Dispo: The patient is from: Home              Anticipated  d/c is to: Home              Patient currently is not medically stable to d/c.   Difficult to place patient No   TOTAL TIME TAKING CARE OF THIS PATIENT: 50 minutes.    Christel Mormon M.D on 07/31/2021 at 2:17 AM  [facility]   From 7 PM-7 AM, contact night-coverage [email or web address]  CC: Primary care physician; Burnard Hawthorne, FNP

## 2021-07-31 NOTE — ED Provider Notes (Signed)
80 year old female being evaluated for weakness and left hip pain.  Pending MRI of her brain and left hip.  Patient with reports of weakness on the left side 4 days ago along with severe left hip pain that is limited her ability to ambulate today after a fall. Physical Exam  BP 131/66 (BP Location: Right Arm)   Pulse 81   Temp 97.7 F (36.5 C) (Oral)   Resp 20   Ht $R'5\' 4"'Bq$  (1.626 m)   Wt 81.6 kg   SpO2 95%   BMI 30.90 kg/m   Physical Exam Patient appears well.  No apparent distress.  She is alert and oriented x3.  No neurological deficit. No swelling throughout the left lower extremity.  10 degrees of left hip internal rotation causing severe left groin and thigh pain. ED Course/Procedures   Clinical Course as of 07/31/21 0034  Thu Jul 30, 2021  1452 DG Chest 1 View [PR]  [phone] Patient reassessed.  Agrees with plan for waiting for MRI.  Patient will be signed out to oncoming physician pending follow-up MRI urinalysis and reassessment. [PR]    Clinical Course User Index [PR] Merlyn Lot, MD    Procedures  MDM  80 year old female with severe left hip pain and some reports of weakness.  MRI of the brain is negative for any acute intracranial abnormality.  Her hip MRI shows no sign of an acute fracture but she does have moderate joint effusion with severe degenerative changes.  Radiologist mentions such severe changes of the left hip joint with rapid progression that a septic hip should be considered in differential.  Patient with no signs of sepsis, afebrile with normal white count.  ESR and CRP was added to the blood work.  Due to severe left hip pain and inability to walk, discussed admission to hospitalist who is agreeable to admit patient and will consult Ortho.       Duanne Guess, PA-C 07/31/21 0040    Merlyn Lot, MD 08/03/21 [phone]

## 2021-07-31 NOTE — Consult Note (Addendum)
ORTHOPAEDIC CONSULTATION  REQUESTING PHYSICIAN: Wyvonnia Dusky, MD  Chief Complaint: Left hip pain  HPI: Melody Ochoa is a 80 y.o. female who complains of left hip pain after a fall yesterday at home.  She has had a few falls she says.  She uses a walker or cane most of the time.  She is known to have severe arthritis of the left hip.  She saw Dr. Harlow Mares recently about this who apparently felt there were concerns about her medical conditions that could hinder the surgery.  She had her right total hip done by Dr. Benson Setting 2 or 3 years ago and did well.  She is also had significant back surgeries with fusion and artificial disc.  She lives at home with her husband.  She is on Plavix and cannot take NSAIDs.  X-rays and MRI of the left hip show advanced arthritis with incomplete coverage of the femoral head.  No fractures or dislocations are noted.  Past Medical History:  Diagnosis Date   Arthritis    Atheromatous plaque 10/18/2017   Back pain    Cancer ([place])    skin   Depression    Diverticulosis    Dizziness    patient had episode of dizziness when came in room. hx past no dx   GERD (gastroesophageal reflux disease)    Heart murmur    Hyperlipidemia 10/18/2017   PONV (postoperative nausea and vomiting)    yrs ago   Squamous cell carcinoma of skin 11/16/2016   L chest parasternal   Squamous cell carcinoma of skin 02/05/2020   L cheek inf to lat zygoma   Stroke [facility])    Past Surgical History:  Procedure Laterality Date   BACK SURGERY  12   BREAST BIOPSY Bilateral    cores "years ago"   BREAST EXCISIONAL BIOPSY Left 90's   CATARACT EXTRACTION W/PHACO Left 12/20/2018   Procedure: CATARACT EXTRACTION PHACO AND INTRAOCULAR LENS PLACEMENT ([place]) LEFT TOPICAL;  Surgeon: Leandrew Koyanagi, MD;  Location: [place];  Service: Ophthalmology;  Laterality: Left;   CATARACT EXTRACTION W/PHACO Right 01/24/2019   Procedure: CATARACT EXTRACTION PHACO AND INTRAOCULAR LENS  PLACEMENT ([place]) RIGHT;  Surgeon: Leandrew Koyanagi, MD;  Location: [place];  Service: Ophthalmology;  Laterality: Right;   CERVICAL DISC SURGERY  09   COLONOSCOPY WITH PROPOFOL N/A 01/13/2016   Procedure: COLONOSCOPY WITH PROPOFOL;  Surgeon: [name], MD;  Location: [facility] ENDOSCOPY;  Service: Endoscopy;  Laterality: N/A;   COLONOSCOPY WITH PROPOFOL N/A 02/19/2020   Procedure: COLONOSCOPY WITH PROPOFOL;  Surgeon: [name], MD;  Location: [facility] ENDOSCOPY;  Service: Gastroenterology;  Laterality: N/A;   FACELIFT  94   LOOP RECORDER INSERTION N/A 08/23/2017   Procedure: LOOP RECORDER INSERTION;  Surgeon: Thompson Grayer, MD;  Location: [place] CV LAB;  Service: Cardiovascular;  Laterality: N/A;   TEE WITHOUT CARDIOVERSION N/A 08/12/2017   Procedure: TRANSESOPHAGEAL ECHOCARDIOGRAM (TEE);  Surgeon: Sueanne Margarita, MD;  Location: [facility] ENDOSCOPY;  Service: Cardiovascular;  Laterality: N/A;   TONSILLECTOMY     TOTAL HIP ARTHROPLASTY Right 04/22/2016   Procedure: TOTAL HIP ARTHROPLASTY;  Surgeon: Corky Mull, MD;  Location: [facility] ORS;  Service: Orthopedics;  Laterality: Right;   Social History   Socioeconomic History   Marital status: Married    Spouse name: Trudith Fork   Number of children: Not on file   Years of education: 12   Highest education level: 12th grade  Occupational History   Occupation: Retired  Tobacco  Use   Smoking status: Former    Packs/day: 0.75    Years: 40.00    Pack years: 30.00    Types: Cigarettes    Quit date: 11/13/1996    Years since quitting: 24.7   Smokeless tobacco: Never  Vaping Use   Vaping Use: Never used  Substance and Sexual Activity   Alcohol use: Yes    Comment: occ wine   Drug use: No   Sexual activity: Not Currently  Other Topics Concern   Not on file  Social History Narrative   Not on file   Social Determinants of Health   Financial Resource Strain: Low Risk    Difficulty of Paying Living Expenses: Not hard at all  Food  Insecurity: No Food Insecurity   Worried About Charity fundraiser in the Last Year: Never true   [place] in the Last Year: Never true  Transportation Needs: No Transportation Needs   Lack of Transportation (Medical): No   Lack of Transportation (Non-Medical): No  Physical Activity: Insufficiently Active   Days of Exercise per Week: 7 days   Minutes of Exercise per Session: 20 min  Stress: No Stress Concern Present   Feeling of Stress : Only a little  Social Connections: Moderately Integrated   Frequency of Communication with Friends and Family: More than three times a week   Frequency of Social Gatherings with Friends and Family: More than three times a week   Attends Religious Services: More than 4 times per year   Active Member of [employer] or Organizations: No   Attends Music therapist: Never   Marital Status: Married   Family History  Problem Relation Age of Onset   Lung cancer Father    Aneurysm Brother    Stroke Brother    Diabetes Maternal Grandmother    Kidney disease Maternal Grandfather    Cushing syndrome Paternal Grandmother    Dementia Paternal Grandfather    Breast cancer Maternal Aunt 52   Allergies  Allergen Reactions   Latex Itching   Aleve [Naproxen Sodium] Other (See Comments)    Gi upset   Aspirin Other (See Comments)    Stomach pain-aggravates diverticulosis   Celebrex [Celecoxib] Other (See Comments)    Dizziness    Effexor [Venlafaxine] Other (See Comments)    Hot flashes    Gabapentin Other (See Comments)    "Night terrors"   Hydrocodone Bit-Homatrop Mbr Diarrhea   Ibuprofen Other (See Comments)    Gi upset    Mobic [Meloxicam]     Stomach upset   Tape Itching and Other (See Comments)    Itchy blisters   Vioxx [Rofecoxib] Other (See Comments)    Gi upset   Other Rash and Other (See Comments)    bandaides   Tramadol Other (See Comments)    hallucinations   Prior to Admission medications   Medication Sig Start Date  End Date Taking? Authorizing Provider  albuterol (VENTOLIN HFA) 108 (90 Base) MCG/ACT inhaler TAKE 2 PUFFS BY MOUTH EVERY 6 HOURS AS NEEDED FOR WHEEZE OR SHORTNESS OF BREATH 02/06/21  Yes Burnard Hawthorne, FNP  amLODipine (NORVASC) 5 MG tablet TAKE 1 TABLET BY MOUTH DAILY 07/15/21  Yes Arnett, Yvetta Coder, FNP  ASPIRIN LOW DOSE 81 MG EC tablet TAKE 1 TABLET (81 MG TOTAL) BY MOUTH DAILY FOR 21 DAYS. SWALLOW WHOLE. 03/27/21  Yes Fayrene Helper, MD  atorvastatin (LIPITOR) 80 MG tablet Take 1 tablet (80 mg total) by  mouth daily. 02/06/21  Yes Burnard Hawthorne, FNP  buPROPion (WELLBUTRIN XL) 300 MG 24 hr tablet TAKE ONE TABLET BY MOUTH EVERY DAY 05/11/21  Yes Burnard Hawthorne, FNP  clopidogrel (PLAVIX) 75 MG tablet Take 1 tablet (75 mg total) by mouth daily. 02/06/21  Yes Burnard Hawthorne, FNP  DULoxetine (CYMBALTA) 30 MG capsule TAKE 1 CAPSULE BY MOUTH EVERY DAY 05/11/21  Yes Burnard Hawthorne, FNP  famotidine (PEPCID) 20 MG tablet TAKE ONE TABLET BY MOUTH AT BEDTIME 05/11/21  Yes Burnard Hawthorne, FNP  fluticasone (FLONASE) 50 MCG/ACT nasal spray SPRAY 2 SPRAYS INTO EACH NOSTRIL EVERY DAY 02/06/21  Yes Burnard Hawthorne, FNP  oxyCODONE-acetaminophen (PERCOCET) 5-325 MG tablet Take 1 tablet by mouth every 12 (twelve) hours as needed for severe pain. Must last 30 days. 07/22/21 08/21/21 Yes Gillis Santa, MD  pregabalin (LYRICA) 50 MG capsule TAKE 1 CAPSULE (50 MG) BY MOUTH EVERY DAY AND TAKE 2 CAPSULES (100 MG) AT BEDTIME. Max daily dose '150mg'$ /day. 06/10/21  Yes Gillis Santa, MD   DG Chest 1 View  Result Date: 07/30/2021 CLINICAL DATA:  [place] from toilet. EXAM: CHEST  1 VIEW COMPARISON:  12/21/2020 FINDINGS: Mild cardiomegaly. Chronic aortic atherosclerosis and tortuosity. Chronic loop recorder. Chronic interstitial pulmonary scarring. Chronic elevation of the right hemidiaphragm. No sign of acute in scratch set no sign of acute infiltrate, mass or collapse. There are healing fractures of the lateral seventh,  eighth and ninth ribs. IMPRESSION: No active/acute finding. Cardiomegaly and aortic atherosclerosis. Chronic fibrotic lung disease. Healing rib fractures on the left. Electronically Signed   By: Nelson Chimes M.D.   On: 07/30/2021 14:27   CT HEAD WO CONTRAST (5MM)  Result Date: 07/30/2021 CLINICAL DATA:  Neuro deficit.  Acute stroke suspected. EXAM: CT HEAD WITHOUT CONTRAST CT CERVICAL SPINE WITHOUT CONTRAST TECHNIQUE: Multidetector CT imaging of the head and cervical spine was performed following the standard protocol without intravenous contrast. Multiplanar CT image reconstructions of the cervical spine were also generated. COMPARISON:  Head CT 06/29/2021 FINDINGS: CT HEAD FINDINGS Brain: No acute intracranial hemorrhage. No focal mass lesion. No CT evidence of acute infarction. No midline shift or mass effect. No hydrocephalus. Basilar cisterns are patent. Remote cortical hypodense infarctions in the anterior RIGHT frontal lobe and posterior LEFT parietal lobe. There are periventricular and subcortical white matter hypodensities. Generalized cortical atrophy. Vascular: No hyperdense vessel or unexpected calcification. Skull: Normal. Negative for fracture or focal lesion. Sinuses/Orbits: Paranasal sinuses and mastoid air cells are clear. Orbits are clear. Other: None. CT CERVICAL SPINE FINDINGS Alignment: Normal alignment of the cervical vertebral bodies. Skull base and vertebrae: Normal craniocervical junction. No loss of vertebral body height or disc height. Normal facet articulation. No evidence of fracture. Soft tissues and spinal canal: No prevertebral soft tissue swelling. No perispinal or epidural hematoma. Disc levels: There is disc space narrowing from C5-T1. Anterior endplate osteophytosis. Posterior fusion at C5-C6. Upper chest: Severe interstitial lung disease in the upper lobes. Other: None IMPRESSION: 1. No acute intracranial findings. 2. Remote cerebral cortical infarctions unchanged. 3. No  cervical spine fracture. 4. Posterior cervical fusion stable. 5. Multilevel disc osteophytic disease. 6. Severe interstitial lung disease lung apices Electronically Signed   By: Suzy Bouchard M.D.   On: 07/30/2021 15:07   CT Cervical Spine Wo Contrast  Result Date: 07/30/2021 CLINICAL DATA:  Neuro deficit.  Acute stroke suspected. EXAM: CT HEAD WITHOUT CONTRAST CT CERVICAL SPINE WITHOUT CONTRAST TECHNIQUE: Multidetector CT imaging of the head and  cervical spine was performed following the standard protocol without intravenous contrast. Multiplanar CT image reconstructions of the cervical spine were also generated. COMPARISON:  Head CT 06/29/2021 FINDINGS: CT HEAD FINDINGS Brain: No acute intracranial hemorrhage. No focal mass lesion. No CT evidence of acute infarction. No midline shift or mass effect. No hydrocephalus. Basilar cisterns are patent. Remote cortical hypodense infarctions in the anterior RIGHT frontal lobe and posterior LEFT parietal lobe. There are periventricular and subcortical white matter hypodensities. Generalized cortical atrophy. Vascular: No hyperdense vessel or unexpected calcification. Skull: Normal. Negative for fracture or focal lesion. Sinuses/Orbits: Paranasal sinuses and mastoid air cells are clear. Orbits are clear. Other: None. CT CERVICAL SPINE FINDINGS Alignment: Normal alignment of the cervical vertebral bodies. Skull base and vertebrae: Normal craniocervical junction. No loss of vertebral body height or disc height. Normal facet articulation. No evidence of fracture. Soft tissues and spinal canal: No prevertebral soft tissue swelling. No perispinal or epidural hematoma. Disc levels: There is disc space narrowing from C5-T1. Anterior endplate osteophytosis. Posterior fusion at C5-C6. Upper chest: Severe interstitial lung disease in the upper lobes. Other: None IMPRESSION: 1. No acute intracranial findings. 2. Remote cerebral cortical infarctions unchanged. 3. No cervical  spine fracture. 4. Posterior cervical fusion stable. 5. Multilevel disc osteophytic disease. 6. Severe interstitial lung disease lung apices Electronically Signed   By: Suzy Bouchard M.D.   On: 07/30/2021 15:07   MR BRAIN WO CONTRAST  Result Date: 07/30/2021 CLINICAL DATA:  Initial evaluation for acute weakness. EXAM: MRI HEAD WITHOUT CONTRAST TECHNIQUE: Multiplanar, multiecho pulse sequences of the brain and surrounding structures were obtained without intravenous contrast. COMPARISON:  CT from earlier the same day. FINDINGS: Brain: Diffuse prominence of the CSF containing spaces generalized age-related cerebral atrophy. Mild for age chronic microvascular ischemic disease seen involving the periventricular and deep white matter both cerebral hemispheres. Multiple scattered remote cortical and subcortical infarcts noted involving the right frontal lobe, right parietal lobe, and left parietal lobe. Small remote left cerebellar infarct noted. Mild chronic hemosiderin staining seen above a few of these infarcts. No abnormal foci of restricted diffusion to suggest or subacute ischemia. Gray-white matter differentiation otherwise maintained. No acute intracranial hemorrhage. Single punctate chronic microhemorrhage noted at the posterior right frontoparietal region, of doubtful significance in isolation. No mass lesion, midline shift or mass effect. Mild ventricular prominence related global parenchymal volume loss without hydrocephalus. No extra-axial fluid collection. Pituitary gland and suprasellar region normal. Midline structures intact and normal. Vascular: Major intracranial vascular flow voids are maintained. Skull and upper cervical spine: Craniocervical junction within normal limits. Bone marrow signal intensity normal. No scalp soft tissue abnormality. Sinuses/Orbits: Patient status post bilateral ocular lens replacement. Globes and orbital soft tissues demonstrate no acute finding. Paranasal sinuses are  largely clear. No significant mastoid effusion. Inner ear structures grossly normal. Other: None. IMPRESSION: 1. No acute intracranial abnormality. 2. Multiple scattered remote infarcts involving the right frontal lobe, right parietal lobe, left parietal lobe, and left cerebellum. 3. Underlying age-related cerebral atrophy with mild chronic small vessel ischemic disease. Electronically Signed   By: Jeannine Boga M.D.   On: 07/30/2021 21:57   MR HIP LEFT WO CONTRAST  Result Date: 07/31/2021 CLINICAL DATA:  Left hip pain EXAM: MR OF THE LEFT HIP WITHOUT CONTRAST TECHNIQUE: Multiplanar, multisequence MR imaging was performed. No intravenous contrast was administered. COMPARISON:  None. FINDINGS: Bones: Right total hip arthroplasty with susceptibility artifact partially obscuring the adjacent soft tissue and osseous structures. No left hip fracture or  dislocation. Severe extensive bone marrow edema in the left femoral head and femoral neck with flattening of the superior articular surface. Subchondral marrow edema in the adjacent left acetabulum. Extensive full-thickness cartilage loss throughout the left hip which has rapidly progressed compared with the prior examination of 04/17/2018. No periosteal reaction or bone destruction. No aggressive osseous lesion. Normal sacrum and sacroiliac joints. No SI joint widening or erosive changes. Posterior lumbar fusion partially visualized. Articular cartilage and labrum Articular cartilage: Extensive full-thickness cartilage loss of the left femoral head and acetabulum. Labrum:  Maceration of the left labrum. Joint or bursal effusion Joint effusion:  Large left hip joint effusion and synovitis. Bursae: Small amount of fluid in the left greater trochanteric bursa. Muscles and tendons Flexors: Normal Extensors: Mild edema in the proximal vastus intermedius muscle. Abductors: Normal. Adductors: Normal. Gluteals: Muscle edema in the left gluteus minimus muscle. Mild  atrophy of the gluteus minimus muscle bilaterally. Hamstrings: Normal. Other findings No pelvic free fluid. No fluid collection or hematoma. No inguinal lymphadenopathy. No inguinal hernia. IMPRESSION: 1. Severe extensive bone marrow edema in the left femoral head and femoral neck with flattening of the superior articular surface and extensive full-thickness cartilage loss which has rapidly progressed compared with the prior examination of 04/17/2018. Large left hip joint effusion and synovitis. Differential diagnosis includes septic arthritis versus rapidly progressive osteoarthritis (RPOA). If there is clinical concern regarding septic arthritis, recommend arthrocentesis. 2. Muscle edema in the left gluteus minimus muscle and proximal vastus intermedius muscle likely reflecting reactive edema versus less likely myositis. Electronically Signed   By: Kathreen Devoid M.D.   On: 07/31/2021 06:44   DG Hip Unilat W or Wo Pelvis 2-3 Views Left  Result Date: 07/30/2021 CLINICAL DATA:  Fall, left hip pain EXAM: DG HIP (WITH OR WITHOUT PELVIS) 2-3V LEFT COMPARISON:  None. FINDINGS: Osteopenia. No displaced hip or pelvic fracture. Status post right hip total arthroplasty. Severe, bone-on-bone superior joint space loss of the left hip. Nonobstructive pattern of overlying bowel gas. IMPRESSION: 1. Osteopenia. No displaced hip or pelvic fracture. Please note that plain radiographs are significantly insensitive for hip and pelvic fracture, particularly in the setting of osteopenia. Consider CT or MRI to more sensitively evaluate. 2.  Severe, bone-on-bone arthrosis of the left hip. 3.  Status post right hip total arthroplasty. Electronically Signed   By: Eddie Candle M.D.   On: 07/30/2021 14:25    Positive ROS: All other systems have been reviewed and were otherwise negative with the exception of those mentioned in the HPI and as above.  Physical Exam: General: Alert, no acute distress Cardiovascular: No pedal  edema Respiratory: No cyanosis, no use of accessory musculature GI: No organomegaly, abdomen is soft and non-tender Skin: No lesions in the area of chief complaint Neurologic: Sensation intact distally Psychiatric: Patient is competent for consent with normal mood and affect Lymphatic: No axillary or cervical lymphadenopathy  MUSCULOSKELETAL: The patient is alert sitting up in bed and eating dinner.  She is not in obvious discomfort.  Leg lengths are equal.  There is no external rotation deformity.  The right hip has good motion without pain.  Left hip has flexion to about 70 degrees with pain on internal ex rotation.  Neurovascular status is good distally.  Assessment: Advanced osteoarthritis left hip  Plan: The patient needs a total hip replacement to resolve this problem.  She is open to seeing Dr. Roland Rack again and I recommend that she be set up to with him as  soon as possible. She will need medical evaluation and clearance before any major surgery.  She may be discharged home when comfortable enough to get up with a walker.  She may return to our office to see Dr. Harlow Mares if she so wishes.    Park Breed, MD [phone]   07/31/2021 6:19 PM

## 2021-07-31 NOTE — Progress Notes (Signed)
PROGRESS NOTE    MORENE HEILIG  [name] DOB: 05/11/1941 DOA: 07/30/2021 PCP: Burnard Hawthorne, FNP    Assessment & Plan:   Active Problems:   Left hip pain   Mechanical fall: w/ subsequent left hip pain and inability to ambulate.  XR of hips shows osteopenia, no displaced hip or pelvic fracture & s/p right hip total arthroplasty. MRI left hip shows Large left hip joint effusion and synovitis. Differential diagnosis includes septic arthritis versus rapidly progressive osteoarthritis (RPOA).  Ortho surg consulted. PT/OT consulted. Morphine, percocet prn   HLD: continue on statin   Depression: severity unknown. Continue on home dose of bupropion, duloxetine   GERD: continue on home dose of famotidine   Peripheral neuropathy: continue on home dose of pregabalin   Interstitial lung disease vs pulmonary fibrosis: pt's thinks she was given the dx of pulmonary fibrosis w/in the last 4-6 months.  Continue on bronchodilators. Encourage incentive spirometry     DVT prophylaxis: lovenox  Code Status: full  Family Communication:  pt's husband has dementia Disposition Plan: depends on PT/OT recs   Level of care: Med-Surg  Status is: Observation  The patient remains OBS appropriate and will d/c before 2 midnights.  Dispo: The patient is from: Home              Anticipated d/c is to:  unclear              Patient currently is not medically stable to d/c.   Difficult to place patient : unclear    Consultants:  Ortho surg   Procedures:   Antimicrobials:   Subjective: Pt c/o left hip pain   Objective: Vitals:   07/31/21 0330 07/31/21 0600 07/31/21 0700 07/31/21 0800  BP: 126/69 139/68 (!) 142/67 126/62  Pulse: 81 84 79 81  Resp: '13 14 14 14  '$ Temp:      TempSrc:      SpO2: 91% 100% 100% 100%  Weight:      Height:       No intake or output data in the 24 hours ending 07/31/21 0831 Filed Weights   07/30/21 1246  Weight: 81.6 kg     Examination:  General exam: Appears calm but uncomfortable  Respiratory system: diminished breath sounds b/l. Velcro crackles Cardiovascular system: S1 & S2 +. No rubs, gallops or clicks.  Gastrointestinal system: Abdomen is nondistended, soft and nontender. Normal bowel sounds heard. Central nervous system: Alert and oriented. Moves all extremities  Psychiatry: Judgement and insight appear normal. Mood & affect appropriate.     Data Reviewed: I have personally reviewed following labs and imaging studies  CBC: Recent Labs  Lab 07/30/21 1332 07/31/21 0724  WBC 7.0 5.8  NEUTROABS 4.8  --   HGB 10.0* 9.7*  HCT 30.8* 31.2*  MCV 87.3 86.9  PLT 278 [id number]   Basic Metabolic Panel: Recent Labs  Lab 07/30/21 1332 07/31/21 0724  NA 139 144  K 3.7 3.5  CL 105 109  CO2 27 29  GLUCOSE 98 85  BUN 16 15  CREATININE 1.01* 0.89  CALCIUM 8.8* 8.4*   GFR: Estimated Creatinine Clearance: 51.3 mL/min (by C-G formula based on SCr of 0.89 mg/dL). Liver Function Tests: Recent Labs  Lab 07/30/21 1332  AST 18  ALT 11  ALKPHOS 59  BILITOT 0.7  PROT 6.8  ALBUMIN 3.2*   No results for input(s): LIPASE, AMYLASE in the last 168 hours. No results for input(s): AMMONIA in the last 168 hours.  Coagulation Profile: No results for input(s): INR, PROTIME in the last 168 hours. Cardiac Enzymes: No results for input(s): CKTOTAL, CKMB, CKMBINDEX, TROPONINI in the last 168 hours. BNP (last 3 results) No results for input(s): PROBNP in the last 8760 hours. HbA1C: No results for input(s): HGBA1C in the last 72 hours. CBG: No results for input(s): GLUCAP in the last 168 hours. Lipid Profile: No results for input(s): CHOL, HDL, LDLCALC, TRIG, CHOLHDL, LDLDIRECT in the last 72 hours. Thyroid Function Tests: No results for input(s): TSH, T4TOTAL, FREET4, T3FREE, THYROIDAB in the last 72 hours. Anemia Panel: No results for input(s): VITAMINB12, FOLATE, FERRITIN, TIBC, IRON, RETICCTPCT in the  last 72 hours. Sepsis Labs: No results for input(s): PROCALCITON, LATICACIDVEN in the last 168 hours.  No results found for this or any previous visit (from the past 240 hour(s)).       Radiology Studies: DG Chest 1 View  Result Date: 07/30/2021 CLINICAL DATA:  [place] from toilet. EXAM: CHEST  1 VIEW COMPARISON:  12/21/2020 FINDINGS: Mild cardiomegaly. Chronic aortic atherosclerosis and tortuosity. Chronic loop recorder. Chronic interstitial pulmonary scarring. Chronic elevation of the right hemidiaphragm. No sign of acute in scratch set no sign of acute infiltrate, mass or collapse. There are healing fractures of the lateral seventh, eighth and ninth ribs. IMPRESSION: No active/acute finding. Cardiomegaly and aortic atherosclerosis. Chronic fibrotic lung disease. Healing rib fractures on the left. Electronically Signed   By: Nelson Chimes M.D.   On: 07/30/2021 14:27   CT HEAD WO CONTRAST (5MM)  Result Date: 07/30/2021 CLINICAL DATA:  Neuro deficit.  Acute stroke suspected. EXAM: CT HEAD WITHOUT CONTRAST CT CERVICAL SPINE WITHOUT CONTRAST TECHNIQUE: Multidetector CT imaging of the head and cervical spine was performed following the standard protocol without intravenous contrast. Multiplanar CT image reconstructions of the cervical spine were also generated. COMPARISON:  Head CT 06/29/2021 FINDINGS: CT HEAD FINDINGS Brain: No acute intracranial hemorrhage. No focal mass lesion. No CT evidence of acute infarction. No midline shift or mass effect. No hydrocephalus. Basilar cisterns are patent. Remote cortical hypodense infarctions in the anterior RIGHT frontal lobe and posterior LEFT parietal lobe. There are periventricular and subcortical white matter hypodensities. Generalized cortical atrophy. Vascular: No hyperdense vessel or unexpected calcification. Skull: Normal. Negative for fracture or focal lesion. Sinuses/Orbits: Paranasal sinuses and mastoid air cells are clear. Orbits are clear. Other: None.  CT CERVICAL SPINE FINDINGS Alignment: Normal alignment of the cervical vertebral bodies. Skull base and vertebrae: Normal craniocervical junction. No loss of vertebral body height or disc height. Normal facet articulation. No evidence of fracture. Soft tissues and spinal canal: No prevertebral soft tissue swelling. No perispinal or epidural hematoma. Disc levels: There is disc space narrowing from C5-T1. Anterior endplate osteophytosis. Posterior fusion at C5-C6. Upper chest: Severe interstitial lung disease in the upper lobes. Other: None IMPRESSION: 1. No acute intracranial findings. 2. Remote cerebral cortical infarctions unchanged. 3. No cervical spine fracture. 4. Posterior cervical fusion stable. 5. Multilevel disc osteophytic disease. 6. Severe interstitial lung disease lung apices Electronically Signed   By: Suzy Bouchard M.D.   On: 07/30/2021 15:07   CT Cervical Spine Wo Contrast  Result Date: 07/30/2021 CLINICAL DATA:  Neuro deficit.  Acute stroke suspected. EXAM: CT HEAD WITHOUT CONTRAST CT CERVICAL SPINE WITHOUT CONTRAST TECHNIQUE: Multidetector CT imaging of the head and cervical spine was performed following the standard protocol without intravenous contrast. Multiplanar CT image reconstructions of the cervical spine were also generated. COMPARISON:  Head CT 06/29/2021 FINDINGS: CT HEAD  FINDINGS Brain: No acute intracranial hemorrhage. No focal mass lesion. No CT evidence of acute infarction. No midline shift or mass effect. No hydrocephalus. Basilar cisterns are patent. Remote cortical hypodense infarctions in the anterior RIGHT frontal lobe and posterior LEFT parietal lobe. There are periventricular and subcortical white matter hypodensities. Generalized cortical atrophy. Vascular: No hyperdense vessel or unexpected calcification. Skull: Normal. Negative for fracture or focal lesion. Sinuses/Orbits: Paranasal sinuses and mastoid air cells are clear. Orbits are clear. Other: None. CT CERVICAL  SPINE FINDINGS Alignment: Normal alignment of the cervical vertebral bodies. Skull base and vertebrae: Normal craniocervical junction. No loss of vertebral body height or disc height. Normal facet articulation. No evidence of fracture. Soft tissues and spinal canal: No prevertebral soft tissue swelling. No perispinal or epidural hematoma. Disc levels: There is disc space narrowing from C5-T1. Anterior endplate osteophytosis. Posterior fusion at C5-C6. Upper chest: Severe interstitial lung disease in the upper lobes. Other: None IMPRESSION: 1. No acute intracranial findings. 2. Remote cerebral cortical infarctions unchanged. 3. No cervical spine fracture. 4. Posterior cervical fusion stable. 5. Multilevel disc osteophytic disease. 6. Severe interstitial lung disease lung apices Electronically Signed   By: Suzy Bouchard M.D.   On: 07/30/2021 15:07   MR BRAIN WO CONTRAST  Result Date: 07/30/2021 CLINICAL DATA:  Initial evaluation for acute weakness. EXAM: MRI HEAD WITHOUT CONTRAST TECHNIQUE: Multiplanar, multiecho pulse sequences of the brain and surrounding structures were obtained without intravenous contrast. COMPARISON:  CT from earlier the same day. FINDINGS: Brain: Diffuse prominence of the CSF containing spaces generalized age-related cerebral atrophy. Mild for age chronic microvascular ischemic disease seen involving the periventricular and deep white matter both cerebral hemispheres. Multiple scattered remote cortical and subcortical infarcts noted involving the right frontal lobe, right parietal lobe, and left parietal lobe. Small remote left cerebellar infarct noted. Mild chronic hemosiderin staining seen above a few of these infarcts. No abnormal foci of restricted diffusion to suggest or subacute ischemia. Gray-white matter differentiation otherwise maintained. No acute intracranial hemorrhage. Single punctate chronic microhemorrhage noted at the posterior right frontoparietal region, of doubtful  significance in isolation. No mass lesion, midline shift or mass effect. Mild ventricular prominence related global parenchymal volume loss without hydrocephalus. No extra-axial fluid collection. Pituitary gland and suprasellar region normal. Midline structures intact and normal. Vascular: Major intracranial vascular flow voids are maintained. Skull and upper cervical spine: Craniocervical junction within normal limits. Bone marrow signal intensity normal. No scalp soft tissue abnormality. Sinuses/Orbits: Patient status post bilateral ocular lens replacement. Globes and orbital soft tissues demonstrate no acute finding. Paranasal sinuses are largely clear. No significant mastoid effusion. Inner ear structures grossly normal. Other: None. IMPRESSION: 1. No acute intracranial abnormality. 2. Multiple scattered remote infarcts involving the right frontal lobe, right parietal lobe, left parietal lobe, and left cerebellum. 3. Underlying age-related cerebral atrophy with mild chronic small vessel ischemic disease. Electronically Signed   By: Jeannine Boga M.D.   On: 07/30/2021 21:57   MR HIP LEFT WO CONTRAST  Result Date: 07/31/2021 CLINICAL DATA:  Left hip pain EXAM: MR OF THE LEFT HIP WITHOUT CONTRAST TECHNIQUE: Multiplanar, multisequence MR imaging was performed. No intravenous contrast was administered. COMPARISON:  None. FINDINGS: Bones: Right total hip arthroplasty with susceptibility artifact partially obscuring the adjacent soft tissue and osseous structures. No left hip fracture or dislocation. Severe extensive bone marrow edema in the left femoral head and femoral neck with flattening of the superior articular surface. Subchondral marrow edema in the adjacent left acetabulum. Extensive  full-thickness cartilage loss throughout the left hip which has rapidly progressed compared with the prior examination of 04/17/2018. No periosteal reaction or bone destruction. No aggressive osseous lesion. Normal  sacrum and sacroiliac joints. No SI joint widening or erosive changes. Posterior lumbar fusion partially visualized. Articular cartilage and labrum Articular cartilage: Extensive full-thickness cartilage loss of the left femoral head and acetabulum. Labrum:  Maceration of the left labrum. Joint or bursal effusion Joint effusion:  Large left hip joint effusion and synovitis. Bursae: Small amount of fluid in the left greater trochanteric bursa. Muscles and tendons Flexors: Normal Extensors: Mild edema in the proximal vastus intermedius muscle. Abductors: Normal. Adductors: Normal. Gluteals: Muscle edema in the left gluteus minimus muscle. Mild atrophy of the gluteus minimus muscle bilaterally. Hamstrings: Normal. Other findings No pelvic free fluid. No fluid collection or hematoma. No inguinal lymphadenopathy. No inguinal hernia. IMPRESSION: 1. Severe extensive bone marrow edema in the left femoral head and femoral neck with flattening of the superior articular surface and extensive full-thickness cartilage loss which has rapidly progressed compared with the prior examination of 04/17/2018. Large left hip joint effusion and synovitis. Differential diagnosis includes septic arthritis versus rapidly progressive osteoarthritis (RPOA). If there is clinical concern regarding septic arthritis, recommend arthrocentesis. 2. Muscle edema in the left gluteus minimus muscle and proximal vastus intermedius muscle likely reflecting reactive edema versus less likely myositis. Electronically Signed   By: Kathreen Devoid M.D.   On: 07/31/2021 06:44   DG Hip Unilat W or Wo Pelvis 2-3 Views Left  Result Date: 07/30/2021 CLINICAL DATA:  Fall, left hip pain EXAM: DG HIP (WITH OR WITHOUT PELVIS) 2-3V LEFT COMPARISON:  None. FINDINGS: Osteopenia. No displaced hip or pelvic fracture. Status post right hip total arthroplasty. Severe, bone-on-bone superior joint space loss of the left hip. Nonobstructive pattern of overlying bowel gas.  IMPRESSION: 1. Osteopenia. No displaced hip or pelvic fracture. Please note that plain radiographs are significantly insensitive for hip and pelvic fracture, particularly in the setting of osteopenia. Consider CT or MRI to more sensitively evaluate. 2.  Severe, bone-on-bone arthrosis of the left hip. 3.  Status post right hip total arthroplasty. Electronically Signed   By: Eddie Candle M.D.   On: 07/30/2021 14:25        Scheduled Meds:  amLODipine  5 mg Oral Daily   aspirin EC  81 mg Oral Daily   atorvastatin  80 mg Oral Daily   buPROPion  300 mg Oral Daily   clopidogrel  75 mg Oral Daily   DULoxetine  30 mg Oral Daily   enoxaparin (LOVENOX) injection  40 mg Subcutaneous Q24H   famotidine  20 mg Oral QHS   fluticasone  2 spray Each Nare Daily   pregabalin  50 mg Oral Daily   And   pregabalin  100 mg Oral QHS   Continuous Infusions:  sodium chloride 75 mL/hr at 07/31/21 0643     LOS: 0 days    Time spent: 35 mins     Wyvonnia Dusky, MD [facility] Pager [phone]  If 7PM-7AM, please contact night-coverage 07/31/2021, 8:31 AM

## 2021-07-31 NOTE — Telephone Encounter (Signed)
Placed call to pt to give provider message below. Vm box full

## 2021-07-31 NOTE — Evaluation (Signed)
Physical Therapy Evaluation Patient Details Name: Melody Ochoa MRN: [id number] DOB: 1941-05-04 Today's Date: 07/31/2021   History of Present Illness  80 yo female with onset of fall with injury to R shoulder and L hip, with no precursors of cause.  Pt is unable to walk and has no findings of fracture on imaging.  However, L hip has femoral head remodeling, extensive marrow edema of superior acetabulum and proximal L femur, moderate L hip effusion, soft tissue swelling with adjacent soft tissues.   PMHx:  L hip OA, R THA, osteopenia, depression, dyslipidemia, ILD, GERD, diverticulosis, chronic back pain, fibrotic lung disease, L rib fractures, posterior cervical fusion, cerebral cortical infarcts,  Clinical Impression  Pt was seen for mobility on RW, assisted to sidestep to the chair and position for comfort.  Pt is demonstrating a limited tolerance to WB on LLE, painful even on walker with every step.  Pt will continue on therapy to work toward home, has edema in hip mm's but no outright fracture.  Will focus on standing tolerance and her progression of gait, which would be benefited by nursing continuing to mobilize her as tolerated.    Follow Up Recommendations Home health PT;Supervision for mobility/OOB    Equipment Recommendations  Rolling walker with 5" wheels    Recommendations for Other Services       Precautions / Restrictions Precautions Precautions: Fall      Mobility  Bed Mobility Overal bed mobility: Needs Assistance Bed Mobility: Supine to Sit;Sit to Supine     Supine to sit: Mod assist Sit to supine: Mod assist   General bed mobility comments: mod assist to scoot up in bed    Transfers Overall transfer level: Needs assistance Equipment used: 1 person hand held assist;Rolling walker (2 wheeled) Transfers: Sit to/from Stand Sit to Stand: Min assist         General transfer comment: min assist on side of bed with pt holding railing with other  UE  Ambulation/Gait Ambulation/Gait assistance: Min assist Gait Distance (Feet): 3 Feet Assistive device: 1 person hand held assist;Rolling walker (2 wheeled) Gait Pattern/deviations: Step-to pattern;Shuffle;Decreased stride length;Decreased stance time - left;Narrow base of support Gait velocity: reduced   General Gait Details: pt is uncomfortable with use of LLE and desaturating with effort from 98% to 84%  Stairs            Wheelchair Mobility    Modified Rankin (Stroke Patients Only) Modified Rankin (Stroke Patients Only) Pre-Morbid Rankin Score: Slight disability Modified Rankin: Moderately severe disability     Balance Overall balance assessment: Needs assistance Sitting-balance support: Feet supported Sitting balance-Leahy Scale: Fair     Standing balance support: Bilateral upper extremity supported;During functional activity Standing balance-Leahy Scale: Poor                               Pertinent Vitals/Pain Pain Assessment: Faces Faces Pain Scale: Hurts even more Pain Location: L hip Pain Descriptors / Indicators: Grimacing;Guarding Pain Intervention(s): Limited activity within patient's tolerance;Monitored during session;Repositioned;Premedicated before session    Home Living Family/patient expects to be discharged to:: Private residence Living Arrangements: Spouse/significant other Available Help at Discharge: Family;Available 24 hours/day Type of Home: House Home Access: Stairs to enter Entrance Stairs-Rails: Right;Left;Can reach both Entrance Stairs-Number of Steps: 2 Home Layout: One level Home Equipment: Walker - 2 wheels;Cane - single point;Bedside commode;Shower seat - built in Additional Comments: pt is distracted, some history from the  chart    Prior Function Level of Independence: Independent with assistive device(s)         Comments: has recently used RW, able to walk without help until this fall     Hand Dominance    Dominant Hand: Right    Extremity/Trunk Assessment   Upper Extremity Assessment Upper Extremity Assessment: Generalized weakness    Lower Extremity Assessment Lower Extremity Assessment: Generalized weakness;LLE deficits/detail LLE Deficits / Details: pain related weakness to move LLE LLE Coordination: decreased gross motor    Cervical / Trunk Assessment Cervical / Trunk Assessment: Kyphotic (mild)  Communication   Communication: No difficulties  Cognition Arousal/Alertness: Awake/alert Behavior During Therapy: Anxious Overall Cognitive Status: No family/caregiver present to determine baseline cognitive functioning Area of Impairment: Memory;Attention;Awareness;Problem solving                   Current Attention Level: Selective Memory: Decreased short-term memory     Awareness: Intellectual Problem Solving: Slow processing;Requires verbal cues        General Comments General comments (skin integrity, edema, etc.): requires UE support to move but also losing O2 sat values to move    Exercises     Assessment/Plan    PT Assessment    PT Problem List         PT Treatment Interventions      PT Goals (Current goals can be found in the Care Plan section)  Acute Rehab PT Goals Patient Stated Goal: to get pain down on that hip PT Goal Formulation: With patient Time For Goal Achievement: 08/14/21 Potential to Achieve Goals: Good    Frequency     Barriers to discharge        Co-evaluation               AM-PAC PT "6 Clicks" Mobility  Outcome Measure Help needed turning from your back to your side while in a flat bed without using bedrails?: A Little Help needed moving from lying on your back to sitting on the side of a flat bed without using bedrails?: A Little Help needed moving to and from a bed to a chair (including a wheelchair)?: A Lot Help needed standing up from a chair using your arms (e.g., wheelchair or bedside chair)?: A Little Help  needed to walk in hospital room?: A Little Help needed climbing 3-5 steps with a railing? : Total 6 Click Score: 15    End of Session Equipment Utilized During Treatment: Gait belt;Oxygen Activity Tolerance: Patient tolerated treatment well          Time: [id number] PT Time Calculation (min) (ACUTE ONLY): 27 min   Charges:   PT Evaluation $PT Eval Moderate Complexity: 1 Mod PT Treatments $Therapeutic Activity: 8-22 mins       Ramond Dial 07/31/2021, 12:50 PM  Mee Hives, PT MS Acute Rehab Dept. Number: [place] [facility] [place]

## 2021-07-31 NOTE — Progress Notes (Signed)
Breakfast tray delivered to patient, requested toast. Dinning services called for request.

## 2021-08-01 DIAGNOSIS — D649 Anemia, unspecified: Secondary | ICD-10-CM | POA: Diagnosis not present

## 2021-08-01 DIAGNOSIS — G6289 Other specified polyneuropathies: Secondary | ICD-10-CM

## 2021-08-01 DIAGNOSIS — M1612 Unilateral primary osteoarthritis, left hip: Principal | ICD-10-CM

## 2021-08-01 LAB — BASIC METABOLIC PANEL
Anion gap: 6 (ref 5–15)
BUN: 16 mg/dL (ref 8–23)
CO2: 29 mmol/L (ref 22–32)
Calcium: 8.6 mg/dL — ABNORMAL LOW (ref 8.9–10.3)
Chloride: 106 mmol/L (ref 98–111)
Creatinine, Ser: 0.9 mg/dL (ref 0.44–1.00)
GFR, Estimated: >60 mL/min (ref >60)
Glucose, Bld: 77 mg/dL (ref 70–99)
Potassium: 4 mmol/L (ref 3.5–5.1)
Sodium: 141 mmol/L (ref 135–145)

## 2021-08-01 LAB — CBC
HCT: 29.7 % — ABNORMAL LOW (ref 36.0–46.0)
Hemoglobin: 9.5 g/dL — ABNORMAL LOW (ref 12.0–15.0)
MCH: 28.2 pg (ref 26.0–34.0)
MCHC: 32 g/dL (ref 30.0–36.0)
MCV: 88.1 fL (ref 80.0–100.0)
Platelets: 242 10*3/uL (ref 150–400)
RBC: 3.37 MIL/uL — ABNORMAL LOW (ref 3.87–5.11)
RDW: 15.4 % (ref 11.5–15.5)
WBC: 5.8 10*3/uL (ref 4.0–10.5)
nRBC: 0 % (ref 0.0–0.2)

## 2021-08-01 MED ORDER — OXYCODONE-ACETAMINOPHEN 5-325 MG PO TABS
1.0000 | ORAL_TABLET | ORAL | Status: DC | PRN
  Administered 2021-08-01 – 2021-08-04 (×6): 1 via ORAL
  Filled 2021-08-01 (×7): qty 1

## 2021-08-01 NOTE — Progress Notes (Signed)
PROGRESS NOTE    JAMILYA MANALILI  [id number] DOB: Sep 30, 1941 DOA: 07/30/2021 PCP: Burnard Hawthorne, FNP    Assessment & Plan:   Active Problems:   Left hip pain   Mechanical fall: w/ subsequent left hip pain and inability to ambulate. Likely secondary to advanced OA of left hip. Dr. Sabra Heck suggested the pt see Dr. Roland Rack again as an outpatient in regards to getting total left hip replacement.Ortho surg recs apprec.  HH recs PT. Morphine, percocet prn   HLD: continue on statin  Depression: severity unknown. Continue on home dose of duloxetine, bupropion    GERD: continue on home dose of famotidine   Peripheral neuropathy: continue on home dose of pregabalin   Interstitial lung disease vs pulmonary fibrosis: pt's thinks she was given the dx of pulmonary fibrosis w/in the last 4-6 months.  Continue on bronchodilators. Encourage incentive spirometry   Normocytic anemia: H&H are labile. No need for a transfusion currently    DVT prophylaxis: lovenox  Code Status: full  Family Communication:  discussed pt's care w/ pt's family at bedside and answered their questions  Disposition Plan: likely d/c back home   Level of care: Med-Surg  Status is: Observation  The patient remains OBS appropriate and will d/c before 2 midnights., still c/o a lot of hip pain. Can likely d/c in 24-48 hours  Dispo: The patient is from: Home              Anticipated d/c is to:  unclear              Patient currently is not medically stable to d/c.   Difficult to place patient : unclear    Consultants:  Ortho surg   Procedures:   Antimicrobials:   Subjective: Pt still c/o left hip pain   Objective: Vitals:   07/31/21 1944 08/01/21 0047 08/01/21 0436 08/01/21 0728  BP: 135/80 137/68 137/64 (!) 144/71  Pulse: 87 79 76 73  Resp: '17 16 16 20  '$ Temp: (!) 97.5 F (36.4 C) 97.9 F (36.6 C) 98.7 F (37.1 C) 98 F (36.7 C)  TempSrc:      SpO2: 100% 100% 100% 100%  Weight:      Height:         Intake/Output Summary (Last 24 hours) at 08/01/2021 0804 Last data filed at 08/01/2021 0733 Gross per 24 hour  Intake 1294.84 ml  Output 600 ml  Net 694.84 ml   Filed Weights   07/30/21 1246  Weight: 81.6 kg    Examination:  General exam: Appears calm  Respiratory system: decreased breath sounds b/l  Cardiovascular system: S1/S2+. No rubs or clicks.  Gastrointestinal system: Abd is soft, NT, ND & hypoactive bowel sounds  Central nervous system: Alert and oriented. Moves all extremities  Psychiatry: Judgement and insight appear normal. Flat mood affect      Data Reviewed: I have personally reviewed following labs and imaging studies  CBC: Recent Labs  Lab 07/30/21 1332 07/31/21 0724 08/01/21 0654  WBC 7.0 5.8 5.8  NEUTROABS 4.8  --   --   HGB 10.0* 9.7* 9.5*  HCT 30.8* 31.2* 29.7*  MCV 87.3 86.9 88.1  PLT 278 265 [id number]   Basic Metabolic Panel: Recent Labs  Lab 07/30/21 1332 07/31/21 0724 08/01/21 0654  NA 139 144 141  K 3.7 3.5 4.0  CL 105 109 106  CO2 '27 29 29  '$ GLUCOSE 98 85 77  BUN '16 15 16  '$ CREATININE 1.01* 0.89 0.90  CALCIUM 8.8* 8.4* 8.6*   GFR: Estimated Creatinine Clearance: 50.7 mL/min (by C-G formula based on SCr of 0.9 mg/dL). Liver Function Tests: Recent Labs  Lab 07/30/21 1332  AST 18  ALT 11  ALKPHOS 59  BILITOT 0.7  PROT 6.8  ALBUMIN 3.2*   No results for input(s): LIPASE, AMYLASE in the last 168 hours. No results for input(s): AMMONIA in the last 168 hours. Coagulation Profile: No results for input(s): INR, PROTIME in the last 168 hours. Cardiac Enzymes: No results for input(s): CKTOTAL, CKMB, CKMBINDEX, TROPONINI in the last 168 hours. BNP (last 3 results) No results for input(s): PROBNP in the last 8760 hours. HbA1C: No results for input(s): HGBA1C in the last 72 hours. CBG: No results for input(s): GLUCAP in the last 168 hours. Lipid Profile: No results for input(s): CHOL, HDL, LDLCALC, TRIG, CHOLHDL, LDLDIRECT in  the last 72 hours. Thyroid Function Tests: No results for input(s): TSH, T4TOTAL, FREET4, T3FREE, THYROIDAB in the last 72 hours. Anemia Panel: No results for input(s): VITAMINB12, FOLATE, FERRITIN, TIBC, IRON, RETICCTPCT in the last 72 hours. Sepsis Labs: No results for input(s): PROCALCITON, LATICACIDVEN in the last 168 hours.  Recent Results (from the past 240 hour(s))  SARS CORONAVIRUS 2 (TAT 6-24 HRS) Nasopharyngeal Nasopharyngeal Swab     Status: None   Collection Time: 07/31/21  2:05 AM   Specimen: Nasopharyngeal Swab  Result Value Ref Range Status   SARS Coronavirus 2 NEGATIVE NEGATIVE Final    Comment: (NOTE) SARS-CoV-2 target nucleic acids are NOT DETECTED.  The SARS-CoV-2 RNA is generally detectable in upper and lower respiratory specimens during the acute phase of infection. Negative results do not preclude SARS-CoV-2 infection, do not rule out co-infections with other pathogens, and should not be used as the sole basis for treatment or other patient management decisions. Negative results must be combined with clinical observations, patient history, and epidemiological information. The expected result is Negative.  Fact Sheet for Patients: SugarRoll.be  Fact Sheet for Healthcare Providers: https://www.woods-mathews.com/  This test is not yet approved or cleared by the [place] FDA and  has been authorized for detection and/or diagnosis of SARS-CoV-2 by FDA under an Emergency Use Authorization (EUA). This EUA will remain  in effect (meaning this test can be used) for the duration of the COVID-19 declaration under Se ction 564(b)(1) of the Act, 21 U.S.C. section 360bbb-3(b)(1), unless the authorization is terminated or revoked sooner.  Performed at [facility], [place] [address], [place], [place] [address]          Radiology Studies: DG Chest 1 View  Result Date: 07/30/2021 CLINICAL DATA:  [place] from toilet.  EXAM: CHEST  1 VIEW COMPARISON:  12/21/2020 FINDINGS: Mild cardiomegaly. Chronic aortic atherosclerosis and tortuosity. Chronic loop recorder. Chronic interstitial pulmonary scarring. Chronic elevation of the right hemidiaphragm. No sign of acute in scratch set no sign of acute infiltrate, mass or collapse. There are healing fractures of the lateral seventh, eighth and ninth ribs. IMPRESSION: No active/acute finding. Cardiomegaly and aortic atherosclerosis. Chronic fibrotic lung disease. Healing rib fractures on the left. Electronically Signed   By: Nelson Chimes M.D.   On: 07/30/2021 14:27   CT HEAD WO CONTRAST (5MM)  Result Date: 07/30/2021 CLINICAL DATA:  Neuro deficit.  Acute stroke suspected. EXAM: CT HEAD WITHOUT CONTRAST CT CERVICAL SPINE WITHOUT CONTRAST TECHNIQUE: Multidetector CT imaging of the head and cervical spine was performed following the standard protocol without intravenous contrast. Multiplanar CT image reconstructions of the cervical  spine were also generated. COMPARISON:  Head CT 06/29/2021 FINDINGS: CT HEAD FINDINGS Brain: No acute intracranial hemorrhage. No focal mass lesion. No CT evidence of acute infarction. No midline shift or mass effect. No hydrocephalus. Basilar cisterns are patent. Remote cortical hypodense infarctions in the anterior RIGHT frontal lobe and posterior LEFT parietal lobe. There are periventricular and subcortical white matter hypodensities. Generalized cortical atrophy. Vascular: No hyperdense vessel or unexpected calcification. Skull: Normal. Negative for fracture or focal lesion. Sinuses/Orbits: Paranasal sinuses and mastoid air cells are clear. Orbits are clear. Other: None. CT CERVICAL SPINE FINDINGS Alignment: Normal alignment of the cervical vertebral bodies. Skull base and vertebrae: Normal craniocervical junction. No loss of vertebral body height or disc height. Normal facet articulation. No evidence of fracture. Soft tissues and spinal canal: No  prevertebral soft tissue swelling. No perispinal or epidural hematoma. Disc levels: There is disc space narrowing from C5-T1. Anterior endplate osteophytosis. Posterior fusion at C5-C6. Upper chest: Severe interstitial lung disease in the upper lobes. Other: None IMPRESSION: 1. No acute intracranial findings. 2. Remote cerebral cortical infarctions unchanged. 3. No cervical spine fracture. 4. Posterior cervical fusion stable. 5. Multilevel disc osteophytic disease. 6. Severe interstitial lung disease lung apices Electronically Signed   By: Suzy Bouchard M.D.   On: 07/30/2021 15:07   CT Cervical Spine Wo Contrast  Result Date: 07/30/2021 CLINICAL DATA:  Neuro deficit.  Acute stroke suspected. EXAM: CT HEAD WITHOUT CONTRAST CT CERVICAL SPINE WITHOUT CONTRAST TECHNIQUE: Multidetector CT imaging of the head and cervical spine was performed following the standard protocol without intravenous contrast. Multiplanar CT image reconstructions of the cervical spine were also generated. COMPARISON:  Head CT 06/29/2021 FINDINGS: CT HEAD FINDINGS Brain: No acute intracranial hemorrhage. No focal mass lesion. No CT evidence of acute infarction. No midline shift or mass effect. No hydrocephalus. Basilar cisterns are patent. Remote cortical hypodense infarctions in the anterior RIGHT frontal lobe and posterior LEFT parietal lobe. There are periventricular and subcortical white matter hypodensities. Generalized cortical atrophy. Vascular: No hyperdense vessel or unexpected calcification. Skull: Normal. Negative for fracture or focal lesion. Sinuses/Orbits: Paranasal sinuses and mastoid air cells are clear. Orbits are clear. Other: None. CT CERVICAL SPINE FINDINGS Alignment: Normal alignment of the cervical vertebral bodies. Skull base and vertebrae: Normal craniocervical junction. No loss of vertebral body height or disc height. Normal facet articulation. No evidence of fracture. Soft tissues and spinal canal: No prevertebral  soft tissue swelling. No perispinal or epidural hematoma. Disc levels: There is disc space narrowing from C5-T1. Anterior endplate osteophytosis. Posterior fusion at C5-C6. Upper chest: Severe interstitial lung disease in the upper lobes. Other: None IMPRESSION: 1. No acute intracranial findings. 2. Remote cerebral cortical infarctions unchanged. 3. No cervical spine fracture. 4. Posterior cervical fusion stable. 5. Multilevel disc osteophytic disease. 6. Severe interstitial lung disease lung apices Electronically Signed   By: Suzy Bouchard M.D.   On: 07/30/2021 15:07   MR BRAIN WO CONTRAST  Result Date: 07/30/2021 CLINICAL DATA:  Initial evaluation for acute weakness. EXAM: MRI HEAD WITHOUT CONTRAST TECHNIQUE: Multiplanar, multiecho pulse sequences of the brain and surrounding structures were obtained without intravenous contrast. COMPARISON:  CT from earlier the same day. FINDINGS: Brain: Diffuse prominence of the CSF containing spaces generalized age-related cerebral atrophy. Mild for age chronic microvascular ischemic disease seen involving the periventricular and deep white matter both cerebral hemispheres. Multiple scattered remote cortical and subcortical infarcts noted involving the right frontal lobe, right parietal lobe, and left parietal lobe. Small remote  left cerebellar infarct noted. Mild chronic hemosiderin staining seen above a few of these infarcts. No abnormal foci of restricted diffusion to suggest or subacute ischemia. Gray-white matter differentiation otherwise maintained. No acute intracranial hemorrhage. Single punctate chronic microhemorrhage noted at the posterior right frontoparietal region, of doubtful significance in isolation. No mass lesion, midline shift or mass effect. Mild ventricular prominence related global parenchymal volume loss without hydrocephalus. No extra-axial fluid collection. Pituitary gland and suprasellar region normal. Midline structures intact and normal.  Vascular: Major intracranial vascular flow voids are maintained. Skull and upper cervical spine: Craniocervical junction within normal limits. Bone marrow signal intensity normal. No scalp soft tissue abnormality. Sinuses/Orbits: Patient status post bilateral ocular lens replacement. Globes and orbital soft tissues demonstrate no acute finding. Paranasal sinuses are largely clear. No significant mastoid effusion. Inner ear structures grossly normal. Other: None. IMPRESSION: 1. No acute intracranial abnormality. 2. Multiple scattered remote infarcts involving the right frontal lobe, right parietal lobe, left parietal lobe, and left cerebellum. 3. Underlying age-related cerebral atrophy with mild chronic small vessel ischemic disease. Electronically Signed   By: Jeannine Boga M.D.   On: 07/30/2021 21:57   MR HIP LEFT WO CONTRAST  Result Date: 07/31/2021 CLINICAL DATA:  Left hip pain EXAM: MR OF THE LEFT HIP WITHOUT CONTRAST TECHNIQUE: Multiplanar, multisequence MR imaging was performed. No intravenous contrast was administered. COMPARISON:  None. FINDINGS: Bones: Right total hip arthroplasty with susceptibility artifact partially obscuring the adjacent soft tissue and osseous structures. No left hip fracture or dislocation. Severe extensive bone marrow edema in the left femoral head and femoral neck with flattening of the superior articular surface. Subchondral marrow edema in the adjacent left acetabulum. Extensive full-thickness cartilage loss throughout the left hip which has rapidly progressed compared with the prior examination of 04/17/2018. No periosteal reaction or bone destruction. No aggressive osseous lesion. Normal sacrum and sacroiliac joints. No SI joint widening or erosive changes. Posterior lumbar fusion partially visualized. Articular cartilage and labrum Articular cartilage: Extensive full-thickness cartilage loss of the left femoral head and acetabulum. Labrum:  Maceration of the left  labrum. Joint or bursal effusion Joint effusion:  Large left hip joint effusion and synovitis. Bursae: Small amount of fluid in the left greater trochanteric bursa. Muscles and tendons Flexors: Normal Extensors: Mild edema in the proximal vastus intermedius muscle. Abductors: Normal. Adductors: Normal. Gluteals: Muscle edema in the left gluteus minimus muscle. Mild atrophy of the gluteus minimus muscle bilaterally. Hamstrings: Normal. Other findings No pelvic free fluid. No fluid collection or hematoma. No inguinal lymphadenopathy. No inguinal hernia. IMPRESSION: 1. Severe extensive bone marrow edema in the left femoral head and femoral neck with flattening of the superior articular surface and extensive full-thickness cartilage loss which has rapidly progressed compared with the prior examination of 04/17/2018. Large left hip joint effusion and synovitis. Differential diagnosis includes septic arthritis versus rapidly progressive osteoarthritis (RPOA). If there is clinical concern regarding septic arthritis, recommend arthrocentesis. 2. Muscle edema in the left gluteus minimus muscle and proximal vastus intermedius muscle likely reflecting reactive edema versus less likely myositis. Electronically Signed   By: Kathreen Devoid M.D.   On: 07/31/2021 06:44   DG Hip Unilat W or Wo Pelvis 2-3 Views Left  Result Date: 07/30/2021 CLINICAL DATA:  Fall, left hip pain EXAM: DG HIP (WITH OR WITHOUT PELVIS) 2-3V LEFT COMPARISON:  None. FINDINGS: Osteopenia. No displaced hip or pelvic fracture. Status post right hip total arthroplasty. Severe, bone-on-bone superior joint space loss of the left hip. Nonobstructive  pattern of overlying bowel gas. IMPRESSION: 1. Osteopenia. No displaced hip or pelvic fracture. Please note that plain radiographs are significantly insensitive for hip and pelvic fracture, particularly in the setting of osteopenia. Consider CT or MRI to more sensitively evaluate. 2.  Severe, bone-on-bone arthrosis of  the left hip. 3.  Status post right hip total arthroplasty. Electronically Signed   By: Eddie Candle M.D.   On: 07/30/2021 14:25        Scheduled Meds:  amLODipine  5 mg Oral Daily   aspirin EC  81 mg Oral Daily   atorvastatin  80 mg Oral Daily   buPROPion  300 mg Oral Daily   clopidogrel  75 mg Oral Daily   DULoxetine  30 mg Oral Daily   enoxaparin (LOVENOX) injection  40 mg Subcutaneous Q24H   famotidine  20 mg Oral QHS   feeding supplement  237 mL Oral TID BM   fluticasone  2 spray Each Nare Daily   multivitamin with minerals  1 tablet Oral Daily   pregabalin  50 mg Oral Daily   And   pregabalin  100 mg Oral QHS   Continuous Infusions:  sodium chloride 75 mL/hr at 07/31/21 1252     LOS: 0 days    Time spent: 37 mins     Wyvonnia Dusky, MD [facility] Pager [phone]  If 7PM-7AM, please contact night-coverage 08/01/2021, 8:04 AM

## 2021-08-02 DIAGNOSIS — I739 Peripheral vascular disease, unspecified: Secondary | ICD-10-CM | POA: Diagnosis present

## 2021-08-02 DIAGNOSIS — I959 Hypotension, unspecified: Secondary | ICD-10-CM | POA: Diagnosis not present

## 2021-08-02 DIAGNOSIS — H5789 Other specified disorders of eye and adnexa: Secondary | ICD-10-CM | POA: Diagnosis not present

## 2021-08-02 DIAGNOSIS — I6782 Cerebral ischemia: Secondary | ICD-10-CM | POA: Diagnosis not present

## 2021-08-02 DIAGNOSIS — Z886 Allergy status to analgesic agent status: Secondary | ICD-10-CM | POA: Diagnosis not present

## 2021-08-02 DIAGNOSIS — R279 Unspecified lack of coordination: Secondary | ICD-10-CM | POA: Diagnosis not present

## 2021-08-02 DIAGNOSIS — Z96641 Presence of right artificial hip joint: Secondary | ICD-10-CM | POA: Diagnosis present

## 2021-08-02 DIAGNOSIS — Z91048 Other nonmedicinal substance allergy status: Secondary | ICD-10-CM | POA: Diagnosis not present

## 2021-08-02 DIAGNOSIS — G319 Degenerative disease of nervous system, unspecified: Secondary | ICD-10-CM | POA: Diagnosis not present

## 2021-08-02 DIAGNOSIS — I639 Cerebral infarction, unspecified: Secondary | ICD-10-CM | POA: Diagnosis not present

## 2021-08-02 DIAGNOSIS — D649 Anemia, unspecified: Secondary | ICD-10-CM | POA: Diagnosis not present

## 2021-08-02 DIAGNOSIS — E785 Hyperlipidemia, unspecified: Secondary | ICD-10-CM | POA: Diagnosis not present

## 2021-08-02 DIAGNOSIS — J9601 Acute respiratory failure with hypoxia: Secondary | ICD-10-CM | POA: Diagnosis not present

## 2021-08-02 DIAGNOSIS — G8929 Other chronic pain: Secondary | ICD-10-CM | POA: Diagnosis present

## 2021-08-02 DIAGNOSIS — J8410 Pulmonary fibrosis, unspecified: Secondary | ICD-10-CM | POA: Diagnosis not present

## 2021-08-02 DIAGNOSIS — Z87891 Personal history of nicotine dependence: Secondary | ICD-10-CM | POA: Diagnosis not present

## 2021-08-02 DIAGNOSIS — M1612 Unilateral primary osteoarthritis, left hip: Secondary | ICD-10-CM | POA: Diagnosis not present

## 2021-08-02 DIAGNOSIS — K219 Gastro-esophageal reflux disease without esophagitis: Secondary | ICD-10-CM | POA: Diagnosis not present

## 2021-08-02 DIAGNOSIS — M25552 Pain in left hip: Secondary | ICD-10-CM | POA: Diagnosis not present

## 2021-08-02 DIAGNOSIS — R410 Disorientation, unspecified: Secondary | ICD-10-CM | POA: Diagnosis not present

## 2021-08-02 DIAGNOSIS — Z85828 Personal history of other malignant neoplasm of skin: Secondary | ICD-10-CM | POA: Diagnosis not present

## 2021-08-02 DIAGNOSIS — Z471 Aftercare following joint replacement surgery: Secondary | ICD-10-CM | POA: Diagnosis not present

## 2021-08-02 DIAGNOSIS — G6289 Other specified polyneuropathies: Secondary | ICD-10-CM | POA: Diagnosis not present

## 2021-08-02 DIAGNOSIS — J849 Interstitial pulmonary disease, unspecified: Secondary | ICD-10-CM | POA: Diagnosis not present

## 2021-08-02 DIAGNOSIS — R531 Weakness: Secondary | ICD-10-CM | POA: Diagnosis not present

## 2021-08-02 DIAGNOSIS — I951 Orthostatic hypotension: Secondary | ICD-10-CM | POA: Diagnosis not present

## 2021-08-02 DIAGNOSIS — W1811XA Fall from or off toilet without subsequent striking against object, initial encounter: Secondary | ICD-10-CM | POA: Diagnosis present

## 2021-08-02 DIAGNOSIS — Z20822 Contact with and (suspected) exposure to covid-19: Secondary | ICD-10-CM | POA: Diagnosis present

## 2021-08-02 DIAGNOSIS — R5381 Other malaise: Secondary | ICD-10-CM | POA: Diagnosis not present

## 2021-08-02 DIAGNOSIS — Z885 Allergy status to narcotic agent status: Secondary | ICD-10-CM | POA: Diagnosis not present

## 2021-08-02 DIAGNOSIS — M6281 Muscle weakness (generalized): Secondary | ICD-10-CM | POA: Diagnosis not present

## 2021-08-02 DIAGNOSIS — Z96642 Presence of left artificial hip joint: Secondary | ICD-10-CM | POA: Diagnosis not present

## 2021-08-02 DIAGNOSIS — Z8673 Personal history of transient ischemic attack (TIA), and cerebral infarction without residual deficits: Secondary | ICD-10-CM | POA: Diagnosis not present

## 2021-08-02 DIAGNOSIS — Z888 Allergy status to other drugs, medicaments and biological substances status: Secondary | ICD-10-CM | POA: Diagnosis not present

## 2021-08-02 DIAGNOSIS — R0902 Hypoxemia: Secondary | ICD-10-CM | POA: Diagnosis not present

## 2021-08-02 DIAGNOSIS — R296 Repeated falls: Secondary | ICD-10-CM | POA: Diagnosis present

## 2021-08-02 DIAGNOSIS — G629 Polyneuropathy, unspecified: Secondary | ICD-10-CM | POA: Diagnosis present

## 2021-08-02 DIAGNOSIS — D62 Acute posthemorrhagic anemia: Secondary | ICD-10-CM | POA: Diagnosis not present

## 2021-08-02 DIAGNOSIS — Z91040 Latex allergy status: Secondary | ICD-10-CM | POA: Diagnosis not present

## 2021-08-02 DIAGNOSIS — F32A Depression, unspecified: Secondary | ICD-10-CM | POA: Diagnosis present

## 2021-08-02 LAB — BASIC METABOLIC PANEL
Anion gap: 8 (ref 5–15)
BUN: 17 mg/dL (ref 8–23)
CO2: 28 mmol/L (ref 22–32)
Calcium: 8.7 mg/dL — ABNORMAL LOW (ref 8.9–10.3)
Chloride: 106 mmol/L (ref 98–111)
Creatinine, Ser: 0.89 mg/dL (ref 0.44–1.00)
GFR, Estimated: >60 mL/min (ref >60)
Glucose, Bld: 88 mg/dL (ref 70–99)
Potassium: 3.7 mmol/L (ref 3.5–5.1)
Sodium: 142 mmol/L (ref 135–145)

## 2021-08-02 LAB — CBC
HCT: 32.3 % — ABNORMAL LOW (ref 36.0–46.0)
Hemoglobin: 10.3 g/dL — ABNORMAL LOW (ref 12.0–15.0)
MCH: 28.2 pg (ref 26.0–34.0)
MCHC: 31.9 g/dL (ref 30.0–36.0)
MCV: 88.5 fL (ref 80.0–100.0)
Platelets: 283 10*3/uL (ref 150–400)
RBC: 3.65 MIL/uL — ABNORMAL LOW (ref 3.87–5.11)
RDW: 15.3 % (ref 11.5–15.5)
WBC: 6.3 10*3/uL (ref 4.0–10.5)
nRBC: 0 % (ref 0.0–0.2)

## 2021-08-02 NOTE — Progress Notes (Addendum)
PROGRESS NOTE    Melody Ochoa  [id number] DOB: 1941/07/08 DOA: 07/30/2021 PCP: Burnard Hawthorne, FNP    Assessment & Plan:   Active Problems:   Left hip pain   Mechanical fall: w/ subsequent left hip pain and inability to ambulate. Likely secondary to advanced OA of left hip. Dr. Sabra Heck suggested the pt see Dr. Roland Rack again as an outpatient in regards to getting total left hip replacement. Ortho surg recs apprec.  HH recs PT. Morphine, percocet prn. Still w/ a lot of pain ambulating and not sure if pt can walk up steps yet, therapy will need to re-evaluate the pt as the pt has steps in her home  Acute hypoxic respiratory failure: etiology unclear, possibly secondary to interstitial lung disease. Continue on supplemental oxygen and wean as tolerated. Continue on bronchodilators   HLD: continue on statin   Depression: severity unknown. Continue on home dose of bupropion, duloxetine   GERD: continue on H2 blocker   Peripheral neuropathy: continue on home dose of pregabalin  Interstitial lung disease vs pulmonary fibrosis: pt's thinks she was given the dx of pulmonary fibrosis w/in the last 4-6 months.  Continue on bronchodilators. Encourage incentive spirometry   Normocytic anemia: H&H are trending up today    DVT prophylaxis: lovenox  Code Status: full  Family Communication:  discussed pt's care w/ pt's family at bedside and answered their questions  Disposition Plan: likely d/c back home   Level of care: Med-Surg  Status is: Observation  The patient remains OBS appropriate and will d/c before 2 midnights., still c/o a lot of hip pain. Therapy will need to re-evaluate pt as pt is unsure if she can walk up and down steps as pt has steps in her house  Dispo: The patient is from: Home              Anticipated d/c is to:  unclear              Patient currently is not medically stable to d/c.   Difficult to place patient : unclear    Consultants:  Ortho surg    Procedures:   Antimicrobials:   Subjective: Pt c/o left hip pain still   Objective: Vitals:   08/01/21 1616 08/01/21 2105 08/02/21 0518 08/02/21 0746  BP: (!) 107/51 (!) 146/76 139/76 (!) 148/73  Pulse: 79 81 79 76  Resp: '16 20 20 16  '$ Temp: 98.1 F (36.7 C) 98.5 F (36.9 C) 98.5 F (36.9 C) 97.7 F (36.5 C)  TempSrc:  Oral Oral   SpO2: 98% 95% 100% (!) 87%  Weight:      Height:        Intake/Output Summary (Last 24 hours) at 08/02/2021 0754 Last data filed at 08/02/2021 [id number] Gross per 24 hour  Intake 1191.34 ml  Output 1450 ml  Net -258.66 ml   Filed Weights   07/30/21 1246  Weight: 81.6 kg    Examination:  General exam: Appears comfortable  Respiratory system: diminished breath sounds   Cardiovascular system: S1 & S2+. No rubs or clicks  Gastrointestinal system: Abd is soft, NT, obese & normal bowel sounds  Central nervous system: Alert and oriented. Moves all extremities  Psychiatry: Judgement and insight appear normal. Flat mood and affect    Data Reviewed: I have personally reviewed following labs and imaging studies  CBC: Recent Labs  Lab 07/30/21 1332 07/31/21 0724 08/01/21 0654 08/02/21 0528  WBC 7.0 5.8 5.8 6.3  NEUTROABS 4.8  --   --   --  HGB 10.0* 9.7* 9.5* 10.3*  HCT 30.8* 31.2* 29.7* 32.3*  MCV 87.3 86.9 88.1 88.5  PLT 278 265 242 [id number]   Basic Metabolic Panel: Recent Labs  Lab 07/30/21 1332 07/31/21 0724 08/01/21 0654 08/02/21 0528  NA 139 144 141 142  K 3.7 3.5 4.0 3.7  CL 105 109 106 106  CO2 '27 29 29 28  '$ GLUCOSE 98 85 77 88  BUN '16 15 16 17  '$ CREATININE 1.01* 0.89 0.90 0.89  CALCIUM 8.8* 8.4* 8.6* 8.7*   GFR: Estimated Creatinine Clearance: 51.3 mL/min (by C-G formula based on SCr of 0.89 mg/dL). Liver Function Tests: Recent Labs  Lab 07/30/21 1332  AST 18  ALT 11  ALKPHOS 59  BILITOT 0.7  PROT 6.8  ALBUMIN 3.2*   No results for input(s): LIPASE, AMYLASE in the last 168 hours. No results for input(s):  AMMONIA in the last 168 hours. Coagulation Profile: No results for input(s): INR, PROTIME in the last 168 hours. Cardiac Enzymes: No results for input(s): CKTOTAL, CKMB, CKMBINDEX, TROPONINI in the last 168 hours. BNP (last 3 results) No results for input(s): PROBNP in the last 8760 hours. HbA1C: No results for input(s): HGBA1C in the last 72 hours. CBG: No results for input(s): GLUCAP in the last 168 hours. Lipid Profile: No results for input(s): CHOL, HDL, LDLCALC, TRIG, CHOLHDL, LDLDIRECT in the last 72 hours. Thyroid Function Tests: No results for input(s): TSH, T4TOTAL, FREET4, T3FREE, THYROIDAB in the last 72 hours. Anemia Panel: No results for input(s): VITAMINB12, FOLATE, FERRITIN, TIBC, IRON, RETICCTPCT in the last 72 hours. Sepsis Labs: No results for input(s): PROCALCITON, LATICACIDVEN in the last 168 hours.  Recent Results (from the past 240 hour(s))  SARS CORONAVIRUS 2 (TAT 6-24 HRS) Nasopharyngeal Nasopharyngeal Swab     Status: None   Collection Time: 07/31/21  2:05 AM   Specimen: Nasopharyngeal Swab  Result Value Ref Range Status   SARS Coronavirus 2 NEGATIVE NEGATIVE Final    Comment: (NOTE) SARS-CoV-2 target nucleic acids are NOT DETECTED.  The SARS-CoV-2 RNA is generally detectable in upper and lower respiratory specimens during the acute phase of infection. Negative results do not preclude SARS-CoV-2 infection, do not rule out co-infections with other pathogens, and should not be used as the sole basis for treatment or other patient management decisions. Negative results must be combined with clinical observations, patient history, and epidemiological information. The expected result is Negative.  Fact Sheet for Patients: SugarRoll.be  Fact Sheet for Healthcare Providers: https://www.woods-mathews.com/  This test is not yet approved or cleared by the [place] FDA and  has been authorized for detection and/or  diagnosis of SARS-CoV-2 by FDA under an Emergency Use Authorization (EUA). This EUA will remain  in effect (meaning this test can be used) for the duration of the COVID-19 declaration under Se ction 564(b)(1) of the Act, 21 U.S.C. section 360bbb-3(b)(1), unless the authorization is terminated or revoked sooner.  Performed at [facility], [place] [address], [place], [place] [address]          Radiology Studies: No results found.      Scheduled Meds:  amLODipine  5 mg Oral Daily   aspirin EC  81 mg Oral Daily   atorvastatin  80 mg Oral Daily   buPROPion  300 mg Oral Daily   clopidogrel  75 mg Oral Daily   DULoxetine  30 mg Oral Daily   enoxaparin (LOVENOX) injection  40 mg Subcutaneous Q24H   famotidine  20 mg  Oral QHS   feeding supplement  237 mL Oral TID BM   fluticasone  2 spray Each Nare Daily   multivitamin with minerals  1 tablet Oral Daily   pregabalin  50 mg Oral Daily   And   pregabalin  100 mg Oral QHS   Continuous Infusions:     LOS: 0 days    Time spent: 30 mins     Wyvonnia Dusky, MD [facility] Pager [phone]  If 7PM-7AM, please contact night-coverage 08/02/2021, 7:54 AM

## 2021-08-03 DIAGNOSIS — J9601 Acute respiratory failure with hypoxia: Secondary | ICD-10-CM

## 2021-08-03 LAB — BASIC METABOLIC PANEL
Anion gap: 3 — ABNORMAL LOW (ref 5–15)
BUN: 19 mg/dL (ref 8–23)
CO2: 28 mmol/L (ref 22–32)
Calcium: 8.5 mg/dL — ABNORMAL LOW (ref 8.9–10.3)
Chloride: 106 mmol/L (ref 98–111)
Creatinine, Ser: 0.79 mg/dL (ref 0.44–1.00)
GFR, Estimated: >60 mL/min (ref >60)
Glucose, Bld: 83 mg/dL (ref 70–99)
Potassium: 4.1 mmol/L (ref 3.5–5.1)
Sodium: 137 mmol/L (ref 135–145)

## 2021-08-03 LAB — CBC
HCT: 28.7 % — ABNORMAL LOW (ref 36.0–46.0)
Hemoglobin: 9.1 g/dL — ABNORMAL LOW (ref 12.0–15.0)
MCH: 28.1 pg (ref 26.0–34.0)
MCHC: 31.7 g/dL (ref 30.0–36.0)
MCV: 88.6 fL (ref 80.0–100.0)
Platelets: 242 10*3/uL (ref 150–400)
RBC: 3.24 MIL/uL — ABNORMAL LOW (ref 3.87–5.11)
RDW: 15.6 % — ABNORMAL HIGH (ref 11.5–15.5)
WBC: 5.7 10*3/uL (ref 4.0–10.5)
nRBC: 0 % (ref 0.0–0.2)

## 2021-08-03 NOTE — TOC Progression Note (Signed)
Transition of Care [facility]) - Progression Note    Patient Details  Name: Melody Ochoa MRN: [id number] Date of Birth: November 21, 1941  [facility]) CM/SW [place], RN Phone Number: 08/03/2021, 5:11 PM  Clinical Narrative:     The patient is agreeable to go to SNF, Bedserch sent, FL2 completed, PASSR obtained, will review beds once obtained       Expected Discharge Plan and Services                                                 Social Determinants of Health (SDOH) Interventions    Readmission Risk Interventions Readmission Risk Prevention Plan 06/15/2019  Post Dischage Appt Complete  Medication Screening Complete  Transportation Screening Complete  Some recent data might be hidden

## 2021-08-03 NOTE — Progress Notes (Signed)
Subjective:     As the patient remains hospitalized due to her left hip arthritis I think it makes sense for you to contact Dr. Roland Rack and see if he will see her now.  It is possible that he could get her surgery performed this week and not prolong this problem.    Patient reports pain as moderate.  Objective:   VITALS:   Vitals:   08/03/21 0505 08/03/21 0801  BP: (!) 132/50 121/63  Pulse: 76 71  Resp: 18 16  Temp: 98.3 F (36.8 C) 97.8 F (36.6 C)  SpO2: 97% (!) 87%    Neurologically intact  LABS Recent Labs    08/01/21 0654 08/02/21 0528 08/03/21 0503  HGB 9.5* 10.3* 9.1*  HCT 29.7* 32.3* 28.7*  WBC 5.8 6.3 5.7  PLT 242 283 242    Recent Labs    08/01/21 0654 08/02/21 0528 08/03/21 0503  NA 141 142 137  K 4.0 3.7 4.1  BUN '16 17 19  '$ CREATININE 0.90 0.89 0.79  GLUCOSE 77 88 83    No results for input(s): LABPT, INR in the last 72 hours.   Assessment/Plan:      Consult Dr. Roland Rack to see if he can help her sooner rather than later.

## 2021-08-03 NOTE — Progress Notes (Signed)
Physical Therapy Treatment Patient Details Name: Melody Ochoa MRN: [id number] DOB: 1941/02/20 Today's Date: 08/03/2021    History of Present Illness 80 yo female with onset of fall with injury to R shoulder and L hip, with no precursors of cause.  Pt is unable to walk and has no findings of fracture on imaging.  However, L hip has femoral head remodeling, extensive marrow edema of superior acetabulum and proximal L femur, moderate L hip effusion, soft tissue swelling with adjacent soft tissues.   PMHx:  L hip OA, R THA, osteopenia, depression, dyslipidemia, ILD, GERD, diverticulosis, chronic back pain, fibrotic lung disease, L rib fractures, posterior cervical fusion, cerebral cortical infarcts,    PT Comments    Patient is agreeable to PT. No family in the room during treatment session. Patient reports overall her pain has improved and reports no pain during session. She progressed her ambulation distance this session to 36f using rolling walker, but needs assistance for steadying. Activity tolerance currently limited by fatigue with minimal activity and mild dizziness with standing activity. Patient currently requires assistance with all mobility and is unable to ambulate a household distance. She also has stairs to enter her home which would be challenging given generalized weakness and limited activity tolerance. Patient does not feel she has adequate support to discharge home at this time. Discharge recommendation changed to SNF for short term rehab. PT will continue to follow to maximize independence.     Follow Up Recommendations  SNF (assistance with mobility)     Equipment Recommendations  None recommended by PT    Recommendations for Other Services       Precautions / Restrictions Precautions Precautions: Fall Restrictions Weight Bearing Restrictions: No    Mobility  Bed Mobility Overal bed mobility: Needs Assistance Bed Mobility: Supine to Sit     Supine to sit:  Mod assist;HOB elevated     General bed mobility comments: assistance for BLE support and intermittent trunk support provided. verbal cues for technique and task initiation. increased time required to complete tasks    Transfers Overall transfer level: Needs assistance Equipment used: Rolling walker (2 wheeled) Transfers: Sit to/from Stand Sit to Stand: Min assist;Mod assist         General transfer comment: Min A for standing from bed and Mod A for standing from recliner chair for lift off assistance. verbal cues for hand placement  Ambulation/Gait Ambulation/Gait assistance: Min assist Gait Distance (Feet): 10 Feet Assistive device: Rolling walker (2 wheeled) Gait Pattern/deviations: Antalgic;Decreased step length - right;Decreased step length - left;Decreased stance time - left Gait velocity: decreased   General Gait Details: verbal cues for technique for ambulating using rolling walker for support. steadying assistance provided with walking. gait distance is limited by fatigue with activity, mild dizziness, but no pain reported in left hip with mobilizing   Stairs Stairs:  (discussed stair traning with patient. patient needs assistance currently just with ambulation a few feet with limited standing tolerance. recommend to hold on stair training until standing activity tolerance has improved.)           Wheelchair Mobility    Modified Rankin (Stroke Patients Only)       Balance Overall balance assessment: Needs assistance Sitting-balance support: Feet supported Sitting balance-Leahy Scale: Fair     Standing balance support: Bilateral upper extremity supported Standing balance-Leahy Scale: Fair Standing balance comment: patient able to maintain standing balance with CGA. standing tolerance limited to less than 90 seconds (fatigue and mild dizziness)  Cognition Arousal/Alertness: Awake/alert Behavior During Therapy: WFL for  tasks assessed/performed Overall Cognitive Status: No family/caregiver present to determine baseline cognitive functioning                                 General Comments: increased time for motor planning and following commands.      Exercises      General Comments General comments (skin integrity, edema, etc.): removed nasal cannula with activity. Sp02 decreased from 98% (on 3 L02) to 88% on room air. with deep breathing techniques and cues, patient increased Sp02 to 90% at rest. nasual cannula re-applied at end of session.      Pertinent Vitals/Pain Pain Assessment: No/denies pain Pain Intervention(s): Limited activity within patient's tolerance    Home Living                      Prior Function            PT Goals (current goals can now be found in the care plan section) Acute Rehab PT Goals Patient Stated Goal: to be more independent PT Goal Formulation: With patient Time For Goal Achievement: 08/14/21 Potential to Achieve Goals: Good Progress towards PT goals: Progressing toward goals    Frequency    Min 2X/week      PT Plan Discharge plan needs to be updated    Co-evaluation              AM-PAC PT "6 Clicks" Mobility   Outcome Measure  Help needed turning from your back to your side while in a flat bed without using bedrails?: A Little Help needed moving from lying on your back to sitting on the side of a flat bed without using bedrails?: A Lot Help needed moving to and from a bed to a chair (including a wheelchair)?: A Lot Help needed standing up from a chair using your arms (e.g., wheelchair or bedside chair)?: A Lot Help needed to walk in hospital room?: A Little Help needed climbing 3-5 steps with a railing? : A Lot 6 Click Score: 14    End of Session Equipment Utilized During Treatment: Gait belt;Oxygen Activity Tolerance: Patient limited by fatigue Patient left: in chair;with call bell/phone within reach;with chair  alarm set Nurse Communication: Mobility status PT Visit Diagnosis: Unsteadiness on feet (R26.81);Muscle weakness (generalized) (M62.81);Other abnormalities of gait and mobility (R26.89)     Time: [id number] PT Time Calculation (min) (ACUTE ONLY): 33 min  Charges:  $Gait Training: 8-22 mins $Therapeutic Activity: 8-22 mins                     Melody Ochoa, PT, MPT    Percell Locus 08/03/2021, 9:33 AM

## 2021-08-03 NOTE — Progress Notes (Signed)
PROGRESS NOTE    Melody Ochoa  [id number] DOB: [birth year] DOA: 07/30/2021 PCP: Burnard Hawthorne, FNP    Assessment & Plan:   Active Problems:   Left hip pain   Mechanical fall: w/ subsequent left hip pain and inability to ambulate. Likely secondary to advanced OA of left hip. Dr. Sabra Heck suggested the pt see Dr. Roland Rack again as an outpatient in regards to getting total left hip replacement. Ortho surg recs apprec. Still w/ significant pain w/ ambulating and unable to climb steps currently.  PT initially recs HH but now recs SNF. Pt is agreeable to SNF   Acute hypoxic respiratory failure: etiology unclear, possibly secondary to interstitial lung disease. Continue on bronchodilators and encourage incentive spirometry. Continue on supplemental oxygen and wean as tolerated   HLD:  continue on statin   Depression: severity unknown. Continue on home dose of duloxetine, bupropion  GERD: continue on famotidine  Peripheral neuropathy: continue on home dose of pregabalin   Interstitial lung disease vs pulmonary fibrosis: pt's thinks she was given the dx of pulmonary fibrosis w/in the last 4-6 months.  Continue on bronchodilators. Encourage incentive spirometry   Normocytic anemia: H&H are labile. No need for a transfusion currently   Memory deficit: etiology unclear, mild cognitive deficit vs dementia. Re-orient prn    DVT prophylaxis: lovenox  Code Status: full  Family Communication:   Disposition Plan: d/c to SNF   Level of care: Med-Surg  Status is: Inpatient  Remains inpatient appropriate because:Unsafe d/c plan  Dispo: The patient is from: Home              Anticipated d/c is to: SNF              Patient currently is medically stable to d/c.   Difficult to place patient : unclear    Consultants:  Ortho surg   Procedures:   Antimicrobials:   Subjective: Pt c/o pain in hip w/ walking   Objective: Vitals:   08/02/21 0518 08/02/21 0746 08/02/21 1555  08/03/21 0505  BP: 139/76 (!) 148/73 123/63 (!) 132/50  Pulse: 79 76 77 76  Resp: '20 16 16 18  '$ Temp: 98.5 F (36.9 C) 97.7 F (36.5 C) 97.8 F (36.6 C) 98.3 F (36.8 C)  TempSrc: Oral     SpO2: 100% (!) 87% 98% 97%  Weight:      Height:        Intake/Output Summary (Last 24 hours) at 08/03/2021 0749 Last data filed at 08/02/2021 1859 Gross per 24 hour  Intake 480 ml  Output 600 ml  Net -120 ml   Filed Weights   07/30/21 1246  Weight: 81.6 kg    Examination:  General exam: Appears calm & comfortable  Respiratory system: decreased breath sounds b/l Cardiovascular system: S1/S2+. No rubs or clicks  Gastrointestinal system: Abd is soft, NT, ND & hypoactive bowel sounds  Central nervous system: Alert and awake. Moves all extremities   Psychiatry: Judgement and insight appear fair. Flat mood and affect    Data Reviewed: I have personally reviewed following labs and imaging studies  CBC: Recent Labs  Lab 07/30/21 1332 07/31/21 0724 08/01/21 0654 08/02/21 0528 08/03/21 0503  WBC 7.0 5.8 5.8 6.3 5.7  NEUTROABS 4.8  --   --   --   --   HGB 10.0* 9.7* 9.5* 10.3* 9.1*  HCT 30.8* 31.2* 29.7* 32.3* 28.7*  MCV 87.3 86.9 88.1 88.5 88.6  PLT 278 265 242 283 242  Basic Metabolic Panel: Recent Labs  Lab 07/30/21 1332 07/31/21 0724 08/01/21 0654 08/02/21 0528 08/03/21 0503  NA 139 144 141 142 137  K 3.7 3.5 4.0 3.7 4.1  CL 105 109 106 106 106  CO2 '27 29 29 28 28  '$ GLUCOSE 98 85 77 88 83  BUN '16 15 16 17 19  '$ CREATININE 1.01* 0.89 0.90 0.89 0.79  CALCIUM 8.8* 8.4* 8.6* 8.7* 8.5*   GFR: Estimated Creatinine Clearance: 57 mL/min (by C-G formula based on SCr of 0.79 mg/dL). Liver Function Tests: Recent Labs  Lab 07/30/21 1332  AST 18  ALT 11  ALKPHOS 59  BILITOT 0.7  PROT 6.8  ALBUMIN 3.2*   No results for input(s): LIPASE, AMYLASE in the last 168 hours. No results for input(s): AMMONIA in the last 168 hours. Coagulation Profile: No results for input(s):  INR, PROTIME in the last 168 hours. Cardiac Enzymes: No results for input(s): CKTOTAL, CKMB, CKMBINDEX, TROPONINI in the last 168 hours. BNP (last 3 results) No results for input(s): PROBNP in the last 8760 hours. HbA1C: No results for input(s): HGBA1C in the last 72 hours. CBG: No results for input(s): GLUCAP in the last 168 hours. Lipid Profile: No results for input(s): CHOL, HDL, LDLCALC, TRIG, CHOLHDL, LDLDIRECT in the last 72 hours. Thyroid Function Tests: No results for input(s): TSH, T4TOTAL, FREET4, T3FREE, THYROIDAB in the last 72 hours. Anemia Panel: No results for input(s): VITAMINB12, FOLATE, FERRITIN, TIBC, IRON, RETICCTPCT in the last 72 hours. Sepsis Labs: No results for input(s): PROCALCITON, LATICACIDVEN in the last 168 hours.  Recent Results (from the past 240 hour(s))  SARS CORONAVIRUS 2 (TAT 6-24 HRS) Nasopharyngeal Nasopharyngeal Swab     Status: None   Collection Time: 07/31/21  2:05 AM   Specimen: Nasopharyngeal Swab  Result Value Ref Range Status   SARS Coronavirus 2 NEGATIVE NEGATIVE Final    Comment: (NOTE) SARS-CoV-2 target nucleic acids are NOT DETECTED.  The SARS-CoV-2 RNA is generally detectable in upper and lower respiratory specimens during the acute phase of infection. Negative results do not preclude SARS-CoV-2 infection, do not rule out co-infections with other pathogens, and should not be used as the sole basis for treatment or other patient management decisions. Negative results must be combined with clinical observations, patient history, and epidemiological information. The expected result is Negative.  Fact Sheet for Patients: SugarRoll.be  Fact Sheet for Healthcare Providers: https://www.woods-mathews.com/  This test is not yet approved or cleared by the [place] FDA and  has been authorized for detection and/or diagnosis of SARS-CoV-2 by FDA under an Emergency Use Authorization (EUA).  This EUA will remain  in effect (meaning this test can be used) for the duration of the COVID-19 declaration under Se ction 564(b)(1) of the Act, 21 U.S.C. section 360bbb-3(b)(1), unless the authorization is terminated or revoked sooner.  Performed at [facility], [place] [address] [place], [place] [address]          Radiology Studies: No results found.      Scheduled Meds:  amLODipine  5 mg Oral Daily   aspirin EC  81 mg Oral Daily   atorvastatin  80 mg Oral Daily   buPROPion  300 mg Oral Daily   clopidogrel  75 mg Oral Daily   DULoxetine  30 mg Oral Daily   enoxaparin (LOVENOX) injection  40 mg Subcutaneous Q24H   famotidine  20 mg Oral QHS   feeding supplement  237 mL Oral TID BM   fluticasone  2 spray Each Nare Daily   multivitamin with minerals  1 tablet Oral Daily   pregabalin  50 mg Oral Daily   And   pregabalin  100 mg Oral QHS   Continuous Infusions:     LOS: 1 day    Time spent: 32 mins     Wyvonnia Dusky, MD [facility] Pager [phone]  If 7PM-7AM, please contact night-coverage 08/03/2021, 7:49 AM

## 2021-08-03 NOTE — NC FL2 (Signed)
[place] LEVEL OF CARE SCREENING TOOL     IDENTIFICATION  Patient Name: Melody Ochoa Birthdate: [birth year] Sex: female Admission Date (Current Location): 07/30/2021  [facility] and [place] Number:  Engineering geologist and Address:  [facility], [address] [place], [place] [address]      Provider Number: [id number]  Attending Physician Name and Address:  Wyvonnia Dusky, MD  Relative Name and Phone Number:  Abelino Derrick [phone]    Current Level of Care: Hospital Recommended Level of Care: [place] Prior Approval Number:    Date Approved/Denied:   PASRR Number: [id number]  Discharge Plan: SNF    Current Diagnoses: Patient Active Problem List   Diagnosis Date Noted   Leg swelling 07/13/2021   Intercostal neuralgia 04/16/2021   Multiple closed fractures of ribs of left side 04/16/2021   Adenoma of left adrenal gland 12/25/2020   Ischemic stroke ([place]) 12/10/2020   Chronic kidney disease, stage 3a ([place]) 12/09/2020   Chronic respiratory failure with hypoxia ([place]) 12/09/2020   Fall at home, initial encounter 12/09/2020   Right shoulder pain 12/09/2020   Memory deficit 09/24/2020   Pulmonary fibrosis ([place]) 09/14/2020   Chronic cough 09/14/2020   Arthritis of left sacroiliac joint 08/26/2020   Chronic left SI joint pain 08/26/2020   CKD (chronic kidney disease) 07/23/2020   HTN (hypertension) 04/23/2020   Positive fecal immunochemical test    Angiodysplasia of intestinal tract    Transient ischemic attack (TIA) 10/08/2019   Transient right leg weakness 09/30/2019   Peripheral neuropathy 08/10/2019   Primary osteoarthritis of left shoulder 07/24/2019   Elevated blood pressure reading 06/27/2019   Left shoulder pain 05/04/2019   Anemia 03/19/2019   Long term current use of opiate analgesic 01/09/2019   Foraminal stenosis of lumbar region 11/14/2018   Chronic pain syndrome 09/12/2018   SOB  (shortness of breath) on exertion 08/11/2018   Dysphagia 08/11/2018   Cough 06/30/2018   Rash 01/16/2018   Hyperlipidemia 10/18/2017   History of lumbar surgery 10/04/2017   Depression, recurrent ([place]) 09/27/2017   Pulmonary nodules 09/27/2017   History of CVA (cerebrovascular accident) 09/12/2017   Cerebellar stroke, acute ([place]) 08/10/2017   Primary osteoarthritis of left hip 02/28/2017   Strain of left hip 02/28/2017   B12 deficiency 12/21/2016   Generalized OA 05/13/2016   Status post total replacement of right hip 04/22/2016   Personal history of colonic polyps    Greater trochanteric bursitis of both hips 01/09/2016   Prediabetes 08/04/2015   Thoracic aortic atherosclerosis ([place]) 06/16/2015   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   UTI (lower urinary tract infection) 11/22/2014   Low back pain 11/22/2014   Spondylolisthesis of lumbar region 11/18/2014   Lumbar radiculopathy 08/21/2014   Degeneration of lumbar or lumbosacral intervertebral disc 08/21/2014   Left hip pain 08/21/2014   Osteopenia 06/23/2014   Major depression in remission ([place]) 06/23/2014   Anxiety 06/23/2014   Menopause 06/23/2014    Orientation RESPIRATION BLADDER Height & Weight     Self, Time, Situation, Place  Normal Continent, External catheter Weight: 81.6 kg Height:  '5\' 4"'$  (162.6 cm)  BEHAVIORAL SYMPTOMS/MOOD NEUROLOGICAL BOWEL NUTRITION STATUS      Continent Diet (heart Healthy)  AMBULATORY STATUS COMMUNICATION OF NEEDS Skin   Extensive Assist Verbally Normal, Surgical wounds                       Personal Care Assistance Level  of Assistance  Dressing, Bathing Bathing Assistance: Limited assistance   Dressing Assistance: Limited assistance     Functional Limitations Info             SPECIAL CARE FACTORS FREQUENCY  PT (By licensed PT), OT (By licensed OT)     PT Frequency: 5 times per week OT Frequency: 5 times per week            Contractures Contractures Info: Not  present    Additional Factors Info  Code Status, Allergies Code Status Info: Full code Allergies Info: Latex, Aleve , Aspirin, Celebrex , Effexor , Gabapentin, Hydrocodone Bit-homatrop Mbr, Ibuprofen, Mobic, Tape, Vioxx , Other, Tramadol           Current Medications (08/03/2021):  This is the current hospital active medication list Current Facility-Administered Medications  Medication Dose Route Frequency Provider Last Rate Last Admin   acetaminophen (TYLENOL) tablet 650 mg  650 mg Oral Q6H PRN Mansy, Jan A, MD   650 mg at 07/31/21 1957   Or   acetaminophen (TYLENOL) suppository 650 mg  650 mg Rectal Q6H PRN Mansy, Jan A, MD       albuterol (PROVENTIL) (2.5 MG/3ML) 0.083% nebulizer solution 3 mL  3 mL Inhalation Q6H PRN Mansy, Jan A, MD       amLODipine (NORVASC) tablet 5 mg  5 mg Oral Daily Mansy, Jan A, MD   5 mg at 08/03/21 [id number]   aspirin EC tablet 81 mg  81 mg Oral Daily Mansy, Jan A, MD   81 mg at 08/03/21 0949   atorvastatin (LIPITOR) tablet 80 mg  80 mg Oral Daily Mansy, Jan A, MD   80 mg at 08/03/21 0949   buPROPion (WELLBUTRIN XL) 24 hr tablet 300 mg  300 mg Oral Daily Mansy, Jan A, MD   300 mg at 08/03/21 0949   clopidogrel (PLAVIX) tablet 75 mg  75 mg Oral Daily Mansy, Jan A, MD   75 mg at 08/03/21 0949   DULoxetine (CYMBALTA) DR capsule 30 mg  30 mg Oral Daily Mansy, Jan A, MD   30 mg at 08/03/21 0949   enoxaparin (LOVENOX) injection 40 mg  40 mg Subcutaneous Q24H Mansy, Jan A, MD   40 mg at 08/03/21 0803   famotidine (PEPCID) tablet 20 mg  20 mg Oral QHS Mansy, Jan A, MD   20 mg at 08/02/21 2059   feeding supplement (ENSURE ENLIVE / ENSURE PLUS) liquid 237 mL  237 mL Oral TID BM Wyvonnia Dusky, MD   237 mL at 08/03/21 1356   fluticasone (FLONASE) 50 MCG/ACT nasal spray 2 spray  2 spray Each Nare Daily Mansy, Jan A, MD   2 spray at 08/02/21 [id number]   magnesium hydroxide (MILK OF MAGNESIA) suspension 30 mL  30 mL Oral Daily PRN Mansy, Jan A, MD       morphine 2 MG/ML  injection 1-2 mg  1-2 mg Intravenous Q4H PRN Mansy, Jan A, MD   2 mg at 07/31/21 2206   multivitamin with minerals tablet 1 tablet  1 tablet Oral Daily Wyvonnia Dusky, MD   1 tablet at 08/03/21 0949   ondansetron (ZOFRAN) tablet 4 mg  4 mg Oral Q6H PRN Mansy, Jan A, MD       Or   ondansetron [facility]) injection 4 mg  4 mg Intravenous Q6H PRN Mansy, Jan A, MD       oxyCODONE-acetaminophen (PERCOCET/ROXICET) 5-325 MG per tablet 1 tablet  1 tablet  Oral Q4H PRN Mansy, Jan A, MD   1 tablet at 08/03/21 1356   pregabalin (LYRICA) capsule 50 mg  50 mg Oral Daily Renda Rolls, RPH   50 mg at 08/03/21 [id number]   And   pregabalin (LYRICA) capsule 100 mg  100 mg Oral QHS Renda Rolls, RPH   100 mg at 08/02/21 2058   traZODone (DESYREL) tablet 25 mg  25 mg Oral QHS PRN Mansy, Jan A, MD   25 mg at 08/01/21 2119     Discharge Medications: Please see discharge summary for a list of discharge medications.  Relevant Imaging Results:  Relevant Lab Results:   Additional Information SS# [id number]  Su Hilt, RN

## 2021-08-04 DIAGNOSIS — M1612 Unilateral primary osteoarthritis, left hip: Secondary | ICD-10-CM | POA: Diagnosis not present

## 2021-08-04 LAB — CBC
HCT: 30.4 % — ABNORMAL LOW (ref 36.0–46.0)
Hemoglobin: 9.7 g/dL — ABNORMAL LOW (ref 12.0–15.0)
MCH: 28.1 pg (ref 26.0–34.0)
MCHC: 31.9 g/dL (ref 30.0–36.0)
MCV: 88.1 fL (ref 80.0–100.0)
Platelets: 277 10*3/uL (ref 150–400)
RBC: 3.45 MIL/uL — ABNORMAL LOW (ref 3.87–5.11)
RDW: 15.6 % — ABNORMAL HIGH (ref 11.5–15.5)
WBC: 6.4 10*3/uL (ref 4.0–10.5)
nRBC: 0 % (ref 0.0–0.2)

## 2021-08-04 LAB — BASIC METABOLIC PANEL
Anion gap: 6 (ref 5–15)
BUN: 27 mg/dL — ABNORMAL HIGH (ref 8–23)
CO2: 29 mmol/L (ref 22–32)
Calcium: 8.6 mg/dL — ABNORMAL LOW (ref 8.9–10.3)
Chloride: 103 mmol/L (ref 98–111)
Creatinine, Ser: 0.88 mg/dL (ref 0.44–1.00)
GFR, Estimated: >60 mL/min (ref >60)
Glucose, Bld: 84 mg/dL (ref 70–99)
Potassium: 4.3 mmol/L (ref 3.5–5.1)
Sodium: 138 mmol/L (ref 135–145)

## 2021-08-04 MED ORDER — CEFAZOLIN SODIUM-DEXTROSE 2-4 GM/100ML-% IV SOLN
2.0000 g | INTRAVENOUS | Status: AC
  Administered 2021-08-05: 2 g via INTRAVENOUS

## 2021-08-04 NOTE — Consult Note (Addendum)
ORTHOPAEDIC CONSULTATION  REQUESTING PHYSICIAN: Wyvonnia Dusky, MD  Chief Complaint:   Left hip pain  History of Present Illness: Melody Ochoa is a 80 y.o. female with multiple medical problems including hyperlipidemia peripheral vascular disease gastroesophageal reflux disease and depression who lives independently with her husband.  The patient was in her usual state of health until the day prior to admission 4 days ago when she apparently lost her balance and fell, aggravating her left hip symptoms.  She was brought to the emergency room and subsequently admitted for pain control as well as physical therapy assessment as she had difficulty weightbearing on her left leg.  X-rays and MRI scan of the pelvis and left hip were negative for fracture, but did demonstrate significant degenerative changes.  The on-call orthopedic surgeon was consulted and he has referred her to me as I have done her right total hip arthroplasty in the past.  The patient denies any associated injuries resulting from a fall.  She did not strike her head or lose consciousness.  The patient also denies any lightheadedness, dizziness, chest pain, shortness of breath, or other symptoms that may have precipitated her fall.  In addition, she denies any numbness or paresthesias down her leg to her foot.  Past Medical History:  Diagnosis Date   Arthritis    Atheromatous plaque 10/18/2017   Back pain    Cancer ([place])    skin   Depression    Diverticulosis    Dizziness    patient had episode of dizziness when came in room. hx past no dx   GERD (gastroesophageal reflux disease)    Heart murmur    Hyperlipidemia 10/18/2017   PONV (postoperative nausea and vomiting)    yrs ago   Squamous cell carcinoma of skin 11/16/2016   L chest parasternal   Squamous cell carcinoma of skin 02/05/2020   L cheek inf to lat zygoma   Stroke [facility])    Past Surgical  History:  Procedure Laterality Date   BACK SURGERY  13   BREAST BIOPSY Bilateral    cores "years ago"   BREAST EXCISIONAL BIOPSY Left 90's   CATARACT EXTRACTION W/PHACO Left 12/20/2018   Procedure: CATARACT EXTRACTION PHACO AND INTRAOCULAR LENS PLACEMENT ([place]) LEFT TOPICAL;  Surgeon: Leandrew Koyanagi, MD;  Location: [place];  Service: Ophthalmology;  Laterality: Left;   CATARACT EXTRACTION W/PHACO Right 01/24/2019   Procedure: CATARACT EXTRACTION PHACO AND INTRAOCULAR LENS PLACEMENT ([place]) RIGHT;  Surgeon: Leandrew Koyanagi, MD;  Location: [place];  Service: Ophthalmology;  Laterality: Right;   CERVICAL DISC SURGERY  09   COLONOSCOPY WITH PROPOFOL N/A 01/13/2016   Procedure: COLONOSCOPY WITH PROPOFOL;  Surgeon: [name], MD;  Location: [facility] ENDOSCOPY;  Service: Endoscopy;  Laterality: N/A;   COLONOSCOPY WITH PROPOFOL N/A 02/19/2020   Procedure: COLONOSCOPY WITH PROPOFOL;  Surgeon: [name], MD;  Location: [facility] ENDOSCOPY;  Service: Gastroenterology;  Laterality: N/A;   FACELIFT  94   LOOP RECORDER INSERTION N/A 08/23/2017   Procedure: LOOP RECORDER INSERTION;  Surgeon: Thompson Grayer, MD;  Location: [place] CV LAB;  Service: Cardiovascular;  Laterality: N/A;   TEE WITHOUT CARDIOVERSION N/A 08/12/2017   Procedure: TRANSESOPHAGEAL ECHOCARDIOGRAM (TEE);  Surgeon: Sueanne Margarita, MD;  Location: [facility] ENDOSCOPY;  Service: Cardiovascular;  Laterality: N/A;   TONSILLECTOMY     TOTAL HIP ARTHROPLASTY Right 04/22/2016   Procedure: TOTAL HIP ARTHROPLASTY;  Surgeon: Corky Mull, MD;  Location: [facility] ORS;  Service: Orthopedics;  Laterality: Right;  Social History   Socioeconomic History   Marital status: Married    Spouse name: Kaleesha Kallal   Number of children: Not on file   Years of education: 12   Highest education level: 12th grade  Occupational History   Occupation: Retired  Tobacco Use   Smoking status: Former    Packs/day: 0.75    Years: 40.00    Pack  years: 30.00    Types: Cigarettes    Quit date: 11/13/1996    Years since quitting: 24.7   Smokeless tobacco: Never  Vaping Use   Vaping Use: Never used  Substance and Sexual Activity   Alcohol use: Yes    Comment: occ wine   Drug use: No   Sexual activity: Not Currently  Other Topics Concern   Not on file  Social History Narrative   Not on file   Social Determinants of Health   Financial Resource Strain: Low Risk    Difficulty of Paying Living Expenses: Not hard at all  Food Insecurity: No Food Insecurity   Worried About Charity fundraiser in the Last Year: Never true   [place] in the Last Year: Never true  Transportation Needs: No Transportation Needs   Lack of Transportation (Medical): No   Lack of Transportation (Non-Medical): No  Physical Activity: Insufficiently Active   Days of Exercise per Week: 7 days   Minutes of Exercise per Session: 20 min  Stress: No Stress Concern Present   Feeling of Stress : Only a little  Social Connections: Moderately Integrated   Frequency of Communication with Friends and Family: More than three times a week   Frequency of Social Gatherings with Friends and Family: More than three times a week   Attends Religious Services: More than 4 times per year   Active Member of [employer] or Organizations: No   Attends Music therapist: Never   Marital Status: Married   Family History  Problem Relation Age of Onset   Lung cancer Father    Aneurysm Brother    Stroke Brother    Diabetes Maternal Grandmother    Kidney disease Maternal Grandfather    Cushing syndrome Paternal Grandmother    Dementia Paternal Grandfather    Breast cancer Maternal Aunt 52   Allergies  Allergen Reactions   Latex Itching   Aleve [Naproxen Sodium] Other (See Comments)    Gi upset   Aspirin Other (See Comments)    Stomach pain-aggravates diverticulosis   Celebrex [Celecoxib] Other (See Comments)    Dizziness    Effexor [Venlafaxine] Other  (See Comments)    Hot flashes    Gabapentin Other (See Comments)    "Night terrors"   Hydrocodone Bit-Homatrop Mbr Diarrhea   Ibuprofen Other (See Comments)    Gi upset    Mobic [Meloxicam]     Stomach upset   Tape Itching and Other (See Comments)    Itchy blisters   Vioxx [Rofecoxib] Other (See Comments)    Gi upset   Other Rash and Other (See Comments)    bandaides   Tramadol Other (See Comments)    hallucinations   Prior to Admission medications   Medication Sig Start Date End Date Taking? Authorizing Provider  albuterol (VENTOLIN HFA) 108 (90 Base) MCG/ACT inhaler TAKE 2 PUFFS BY MOUTH EVERY 6 HOURS AS NEEDED FOR WHEEZE OR SHORTNESS OF BREATH 02/06/21  Yes Burnard Hawthorne, FNP  amLODipine (NORVASC) 5 MG tablet TAKE 1 TABLET BY MOUTH DAILY 07/15/21  Yes Burnard Hawthorne, FNP  ASPIRIN LOW DOSE 81 MG EC tablet TAKE 1 TABLET (81 MG TOTAL) BY MOUTH DAILY FOR 21 DAYS. SWALLOW WHOLE. 03/27/21  Yes Fayrene Helper, MD  atorvastatin (LIPITOR) 80 MG tablet Take 1 tablet (80 mg total) by mouth daily. 02/06/21  Yes Burnard Hawthorne, FNP  buPROPion (WELLBUTRIN XL) 300 MG 24 hr tablet TAKE ONE TABLET BY MOUTH EVERY DAY 05/11/21  Yes Burnard Hawthorne, FNP  clopidogrel (PLAVIX) 75 MG tablet Take 1 tablet (75 mg total) by mouth daily. 02/06/21  Yes Burnard Hawthorne, FNP  DULoxetine (CYMBALTA) 30 MG capsule TAKE 1 CAPSULE BY MOUTH EVERY DAY 05/11/21  Yes Burnard Hawthorne, FNP  famotidine (PEPCID) 20 MG tablet TAKE ONE TABLET BY MOUTH AT BEDTIME 05/11/21  Yes Burnard Hawthorne, FNP  fluticasone (FLONASE) 50 MCG/ACT nasal spray SPRAY 2 SPRAYS INTO EACH NOSTRIL EVERY DAY 02/06/21  Yes Burnard Hawthorne, FNP  oxyCODONE-acetaminophen (PERCOCET) 5-325 MG tablet Take 1 tablet by mouth every 12 (twelve) hours as needed for severe pain. Must last 30 days. 07/22/21 08/21/21 Yes Gillis Santa, MD  pregabalin (LYRICA) 50 MG capsule TAKE 1 CAPSULE (50 MG) BY MOUTH EVERY DAY AND TAKE 2 CAPSULES (100 MG) AT  BEDTIME. Max daily dose '150mg'$ /day. 06/10/21  Yes Gillis Santa, MD   No results found.  Positive ROS: All other systems have been reviewed and were otherwise negative with the exception of those mentioned in the HPI and as above.  Physical Exam: General:  Alert, no acute distress Psychiatric:  Patient is competent for consent with normal mood and affect   Cardiovascular:  No pedal edema Respiratory:  No wheezing, non-labored breathing GI:  Abdomen is soft and non-tender Skin:  No lesions in the area of chief complaint Neurologic:  Sensation intact distally Lymphatic:  No axillary or cervical lymphadenopathy  Orthopedic Exam:  Orthopedic examination is limited to the left hip and lower extremity.  The left lower extremity is symmetrically aligned to the right.  Skin inspection around the left hip and thigh that is unremarkable.  No swelling, erythema, ecchymosis, abrasions, or other skin abnormalities are identified.  She has only minimal tenderness to palpation over the anterolateral aspect of the hip.  She has more severe pain with any attempted active or passive motion of the hip, including logrolling of the leg.  She is neurovascularly intact to the left lower extremity and foot.  X-rays:  Recent x-rays of the pelvis and left hip are available for review and have been reviewed by myself.  Findings are as described above.  These films demonstrate severe degenerative changes of the left hip with complete loss of the superior clear space, flattening of the femoral head, and early bony erosion of the lateral portion of the acetabulum.  No lytic lesions or fractures are identified.  A recent MRI scan of the pelvis and left hip also is available for review and has been reviewed by myself.  Again, the findings are consistent with significant degenerative joint disease of the femoral acetabular joint with a large effusion and synovitis, as well as maceration of the labrum.  No acute fractures are  identified.  Assessment: Severe degenerative joint disease left hip.  Plan: The treatment options have been discussed with the patient, including both surgical and nonsurgical choices.  Specifically, the patient is offered a left total hip arthroplasty. The risks (including bleeding, infection, nerve and/or blood vessel injury, persistent or recurrent pain, loosening or failure of  the components, leg length inequality, dislocation, need for further surgery, blood clots, strokes, heart attacks or arrhythmias, pneumonia, etc.) and benefits of the surgical procedure were discussed. The patient states her understanding, but would like to discuss the options further with her husband for committing to this procedure.  She understands that I do have time to do this tomorrow.  Otherwise, she may have to wait a month or so to get on my elective schedule.  Meanwhile, the patient may continue to be mobilized with physical therapy, weightbearing as tolerated on the left leg.  If she needs to be discharged to a skilled nursing facility, then that is reasonable as well.  Thank you for asking me to participate in the care of this most pleasant yet unfortunate woman.  I will be happy to follow her with you.   Pascal Lux, MD  Beeper #:  [phone]  08/04/2021 7:35 AM   ADDENDUM: I have revisited this procedure with the patient and she has decided to proceed with the left total hip arthroplasty at this time.  Please be sure that she is medically optimized, especially from a respiratory status, in preparation for her left total hip arthroplasty tomorrow morning.   Pascal Lux, MD  Beeper #:  [phone]  08/04/2021 10:04 AM

## 2021-08-04 NOTE — Progress Notes (Signed)
Physical Therapy Treatment Patient Details Name: AMAURI HELBLING MRN: [id number] DOB: 1941-11-25 Today's Date: 08/04/2021    History of Present Illness 80 yo female with onset of fall with injury to R shoulder and L hip, with no precursors of cause.  Pt is unable to walk and has no findings of fracture on imaging.  However, L hip has femoral head remodeling, extensive marrow edema of superior acetabulum and proximal L femur, moderate L hip effusion, soft tissue swelling with adjacent soft tissues.   PMHx:  L hip OA, R THA, osteopenia, depression, dyslipidemia, ILD, GERD, diverticulosis, chronic back pain, fibrotic lung disease, L rib fractures, posterior cervical fusion, cerebral cortical infarcts,    PT Comments    Patient agreeable to PT. Patient reports she is anticipating to have hip replacement tomorrow.  Per Dr Nicholaus Bloom note, the patient may continue to be mobilized with physical therapy, weightbearing as tolerated on the left leg. She will need a re-evaluation after surgery completed, otherwise will continue with current PT plan of care.  Patient continues to require assistance with mobility. Left hip pain limits progression of ambulation as patient reports 2/10 at rest and 4/10 with weight bearing. Patient was able to get out of bed to bed side commode with assistance and ambulate a short distance with rolling walker with minimal assistance. Patient did have left knee buckling x 1 with weight bearing. Recommend to continue PT to maximize independence and facilitate return to prior level of function.    Follow Up Recommendations  SNF     Equipment Recommendations  None recommended by PT    Recommendations for Other Services       Precautions / Restrictions Precautions Precautions: Fall Restrictions Weight Bearing Restrictions: Yes LLE Weight Bearing: Weight bearing as tolerated    Mobility  Bed Mobility Overal bed mobility: Needs Assistance Bed Mobility: Supine to Sit      Supine to sit: Mod assist;HOB elevated     General bed mobility comments: assistance for LLE and trunk support. verbal cues for technique    Transfers Overall transfer level: Needs assistance   Transfers: Sit to/from Stand Sit to Stand: Min assist         General transfer comment: verbal cues for hand placement for safety. lifting assistance required for standing  Ambulation/Gait Ambulation/Gait assistance: Min assist Gait Distance (Feet): 5 Feet Assistive device: Rolling walker (2 wheeled) Gait Pattern/deviations: Antalgic;Decreased step length - right;Decreased step length - left;Decreased stance time - left;Decreased weight shift to right Gait velocity: decreased   General Gait Details: patient has knee buckling x 1 with standing on LLE. patient needs minimal assistance for steadying, weight shifting to right for advancement of LLE, and intermittent assistance for rolling walker negotiation. unable to ambulate further due to increased pain in left hip from 2/10 at rest to 4/10 with activity.   Stairs             Wheelchair Mobility    Modified Rankin (Stroke Patients Only)       Balance                                            Cognition Arousal/Alertness: Awake/alert Behavior During Therapy: WFL for tasks assessed/performed Overall Cognitive Status: Within Functional Limits for tasks assessed  General Comments: patient able to follow all commands without difficulty      Exercises      General Comments        Pertinent Vitals/Pain Pain Assessment: 0-10 Pain Score: 4  Pain Location: L hip Pain Descriptors / Indicators: Discomfort Pain Intervention(s): Limited activity within patient's tolerance    Home Living                      Prior Function            PT Goals (current goals can now be found in the care plan section) Acute Rehab PT Goals Patient Stated Goal: to  be more independent PT Goal Formulation: With patient Time For Goal Achievement: 08/14/21 Potential to Achieve Goals: Good Progress towards PT goals: Progressing toward goals    Frequency    Min 2X/week      PT Plan Current plan remains appropriate    Co-evaluation              AM-PAC PT "6 Clicks" Mobility   Outcome Measure  Help needed turning from your back to your side while in a flat bed without using bedrails?: A Little Help needed moving from lying on your back to sitting on the side of a flat bed without using bedrails?: A Lot Help needed moving to and from a bed to a chair (including a wheelchair)?: A Lot Help needed standing up from a chair using your arms (e.g., wheelchair or bedside chair)?: A Lot Help needed to walk in hospital room?: A Little Help needed climbing 3-5 steps with a railing? : A Lot 6 Click Score: 14    End of Session Equipment Utilized During Treatment: Gait belt;Oxygen Activity Tolerance: Patient limited by fatigue Patient left: in chair;with call bell/phone within reach;with chair alarm set Nurse Communication: Mobility status PT Visit Diagnosis: Unsteadiness on feet (R26.81);Muscle weakness (generalized) (M62.81);Other abnormalities of gait and mobility (R26.89)     Time: [id number] PT Time Calculation (min) (ACUTE ONLY): 38 min  Charges:   $Gait Training: 8-22 mins $Therapeutic Activity: 23-37 mins                     Minna Merritts, PT, MPT    Percell Locus 08/04/2021, 12:34 PM

## 2021-08-04 NOTE — TOC Progression Note (Signed)
Transition of Care [facility]) - Progression Note    Patient Details  Name: Melody Ochoa MRN: [id number] Date of Birth: 01-02-1941  Transition of Care [facility]) CM/SW [place], RN Phone Number: 08/04/2021, 10:25 AM  Clinical Narrative:     The patient has no bed offers yet, will continue to monitor       Expected Discharge Plan and Services                                                 Social Determinants of Health (SDOH) Interventions    Readmission Risk Interventions Readmission Risk Prevention Plan 06/15/2019  Post Dischage Appt Complete  Medication Screening Complete  Transportation Screening Complete  Some recent data might be hidden

## 2021-08-04 NOTE — Progress Notes (Signed)
PROGRESS NOTE   Hospital course was taken from Dr. Sidney Ace: Melody Ochoa is a 80 y.o. female with medical history significant for depression, dyslipidemia, interstitial lung disease, GERD, diverticulosis, chronic back pain, who presents to the emergency room with acute onset of accidental mechanical fall on her right shoulder followed by landing on her buttock and left hip.  She had subsequent left hip pain.  No presyncope or syncope.  No chest pain or dyspnea or palpitations.  She denies any cough or wheezing.  No dysuria, oliguria or hematuria or flank pain.  The patient has not been able to ambulate since her fall.  ED Course: Upon presentation to the ER, vital signs are within normal.  Labs revealed unremarkable CMP.  CBC showed anemia close to previous levels.  Sed rate was elevated at 60.  High-sensitivity troponin was 10 and later 11.  Urinalysis was unremarkable.  Respiratory panel is currently pending. EKG as reviewed by me : Showed normal sinus rhythm with a rate of 79 with prolonged QT interval with QTC of 509 MS. Imaging: Chest x-ray showed cardiomegaly and aortic atherosclerosis, chronic fibrotic lung disease and healing rib fractures on the left with no acute findings otherwise.  Hip x-ray showed the following: 1. Osteopenia. No displaced hip or pelvic fracture. Please note that plain radiographs are significantly insensitive for hip and pelvic fracture, particularly in the setting of osteopenia. Consider CT or MRI to more sensitively evaluate.   2.  Severe, bone-on-bone arthrosis of the left hip.   3.  Status post right hip total arthroplasty.   Hospital course from Dr. Jimmye Norman 8/26-8/30/22: Pt presented after a mechanical fall at home w/ subsequent left hip pain. MRI of left hip showed Severe extensive bone marrow edema in the left femoral head and femoral neck with flattening of the superior articular surface and extensive full-thickness cartilage loss which has rapidly  progressed compared with the prior examination of 04/17/2018. Large left hip joint effusion and synovitis. Differential diagnosis includes septic arthritis versus rapidly progressive osteoarthritis (RPOA) . Initially, ortho surg, Dr. Sabra Heck, was consulted and recommended suggested the pt see Dr. Roland Rack again as an outpatient in regards to getting total left hip replacement. Dr. Roland Rack evaluated the pt today and dicussed left total hip arthoplasty with pt and pt wanted to discuss with her husband before deciding to proceed with surgery. PT/OT initially evaluated the pt and recommended HH and upon reevaluated they recommended SNF.    Melody Ochoa  [id number] DOB: 1941-09-25 DOA: 07/30/2021 PCP: Burnard Hawthorne, FNP    Assessment & Plan:   Active Problems:   Left hip pain   Mechanical fall: w/ subsequent left hip pain and inability to ambulate. Likely secondary to advanced OA of left hip. Dr. Roland Rack saw pt today discussed total arthoplasty with pt today but pt's wants to discuss with her husband first. PT initially recs HH but now recs SNF. Pt is agreeable to SNF   Acute hypoxic respiratory failure: etiology unclear, possibly secondary to interstitial lung disease. Continue on bronchodilators and encourage incentive spirometry. Continue on supplemental oxygen and wean as tolerated   Interstitial lung disease vs pulmonary fibrosis: pt's thinks she was given the dx of pulmonary fibrosis w/in the last 4-6 months.  Continue on bronchodilators. Encourage incentive spirometry   HLD:  continue on statin   Depression: severity unknown. Continue on home dose of duloxetine, bupropion  GERD: continue on H2 blocker   Peripheral neuropathy: continue on pregabalin   Normocytic anemia:  H&H are labile. Will continue to monitor   Memory deficit: etiology unclear, mild cognitive deficit vs dementia. Re-orient prn    DVT prophylaxis: lovenox  Code Status: full  Family Communication:   Disposition  Plan: d/c to SNF   Level of care: Med-Surg  Status is: Inpatient  Remains inpatient appropriate because:Unsafe d/c plan  Dispo: The patient is from: Home              Anticipated d/c is to: SNF              Patient currently is medically stable to d/c.   Difficult to place patient : unclear    Consultants:  Ortho surg   Procedures:   Antimicrobials:   Subjective: Pt still c/o hip pain   Objective: Vitals:   08/03/21 0801 08/03/21 1714 08/03/21 2000 08/04/21 0526  BP: 121/63 121/63 111/75 (!) 133/58  Pulse: 71 79 86 80  Resp: '16 16 18 18  '$ Temp: 97.8 F (36.6 C) (!) 97.5 F (36.4 C) 98.2 F (36.8 C) 98.4 F (36.9 C)  TempSrc:      SpO2: (!) 87% 97% 94% 98%  Weight:      Height:        Intake/Output Summary (Last 24 hours) at 08/04/2021 0820 Last data filed at 08/04/2021 0530 Gross per 24 hour  Intake --  Output 600 ml  Net -600 ml   Filed Weights   07/30/21 1246  Weight: 81.6 kg    Examination:  General exam: Appears uncomfortable   Respiratory system: diminished breath sounds b/l Cardiovascular system: S1 & S2+. No rubs or clicks  Gastrointestinal system: Abd is soft, NT, ND & hypoactive bowel sounds Central nervous system: Alert and awake. Moves all extremities Psychiatry: Judgement and insight appear fair. Flat mood and affect    Data Reviewed: I have personally reviewed following labs and imaging studies  CBC: Recent Labs  Lab 07/30/21 1332 07/31/21 0724 08/01/21 0654 08/02/21 0528 08/03/21 0503 08/04/21 0520  WBC 7.0 5.8 5.8 6.3 5.7 6.4  NEUTROABS 4.8  --   --   --   --   --   HGB 10.0* 9.7* 9.5* 10.3* 9.1* 9.7*  HCT 30.8* 31.2* 29.7* 32.3* 28.7* 30.4*  MCV 87.3 86.9 88.1 88.5 88.6 88.1  PLT 278 265 242 283 242 [id number]   Basic Metabolic Panel: Recent Labs  Lab 07/31/21 0724 08/01/21 0654 08/02/21 0528 08/03/21 0503 08/04/21 0520  NA 144 141 142 137 138  K 3.5 4.0 3.7 4.1 4.3  CL 109 106 106 106 103  CO2 '29 29 28 28 29   '$ GLUCOSE 85 77 88 83 84  BUN '15 16 17 19 '$ 27*  CREATININE 0.89 0.90 0.89 0.79 0.88  CALCIUM 8.4* 8.6* 8.7* 8.5* 8.6*   GFR: Estimated Creatinine Clearance: 51.8 mL/min (by C-G formula based on SCr of 0.88 mg/dL). Liver Function Tests: Recent Labs  Lab 07/30/21 1332  AST 18  ALT 11  ALKPHOS 59  BILITOT 0.7  PROT 6.8  ALBUMIN 3.2*   No results for input(s): LIPASE, AMYLASE in the last 168 hours. No results for input(s): AMMONIA in the last 168 hours. Coagulation Profile: No results for input(s): INR, PROTIME in the last 168 hours. Cardiac Enzymes: No results for input(s): CKTOTAL, CKMB, CKMBINDEX, TROPONINI in the last 168 hours. BNP (last 3 results) No results for input(s): PROBNP in the last 8760 hours. HbA1C: No results for input(s): HGBA1C in the last 72 hours. CBG: No results for  input(s): GLUCAP in the last 168 hours. Lipid Profile: No results for input(s): CHOL, HDL, LDLCALC, TRIG, CHOLHDL, LDLDIRECT in the last 72 hours. Thyroid Function Tests: No results for input(s): TSH, T4TOTAL, FREET4, T3FREE, THYROIDAB in the last 72 hours. Anemia Panel: No results for input(s): VITAMINB12, FOLATE, FERRITIN, TIBC, IRON, RETICCTPCT in the last 72 hours. Sepsis Labs: No results for input(s): PROCALCITON, LATICACIDVEN in the last 168 hours.  Recent Results (from the past 240 hour(s))  SARS CORONAVIRUS 2 (TAT 6-24 HRS) Nasopharyngeal Nasopharyngeal Swab     Status: None   Collection Time: 07/31/21  2:05 AM   Specimen: Nasopharyngeal Swab  Result Value Ref Range Status   SARS Coronavirus 2 NEGATIVE NEGATIVE Final    Comment: (NOTE) SARS-CoV-2 target nucleic acids are NOT DETECTED.  The SARS-CoV-2 RNA is generally detectable in upper and lower respiratory specimens during the acute phase of infection. Negative results do not preclude SARS-CoV-2 infection, do not rule out co-infections with other pathogens, and should not be used as the sole basis for treatment or other  patient management decisions. Negative results must be combined with clinical observations, patient history, and epidemiological information. The expected result is Negative.  Fact Sheet for Patients: SugarRoll.be  Fact Sheet for Healthcare Providers: https://www.woods-mathews.com/  This test is not yet approved or cleared by the [place] FDA and  has been authorized for detection and/or diagnosis of SARS-CoV-2 by FDA under an Emergency Use Authorization (EUA). This EUA will remain  in effect (meaning this test can be used) for the duration of the COVID-19 declaration under Se ction 564(b)(1) of the Act, 21 U.S.C. section 360bbb-3(b)(1), unless the authorization is terminated or revoked sooner.  Performed at [facility], [address], [place], [place] [address]          Radiology Studies: No results found.      Scheduled Meds:  amLODipine  5 mg Oral Daily   aspirin EC  81 mg Oral Daily   atorvastatin  80 mg Oral Daily   buPROPion  300 mg Oral Daily   clopidogrel  75 mg Oral Daily   DULoxetine  30 mg Oral Daily   enoxaparin (LOVENOX) injection  40 mg Subcutaneous Q24H   famotidine  20 mg Oral QHS   feeding supplement  237 mL Oral TID BM   fluticasone  2 spray Each Nare Daily   multivitamin with minerals  1 tablet Oral Daily   pregabalin  50 mg Oral Daily   And   pregabalin  100 mg Oral QHS   Continuous Infusions:     LOS: 2 days    Time spent: 30 mins     Wyvonnia Dusky, MD [facility] Pager [phone]  If 7PM-7AM, please contact night-coverage 08/04/2021, 8:20 AM

## 2021-08-05 ENCOUNTER — Encounter
Admission: EM | Disposition: A | Discharge status: Skilled Nursing Facility | Payer: Self-pay | Source: Home / Self Care | Attending: Internal Medicine

## 2021-08-05 ENCOUNTER — Encounter: Payer: Self-pay | Admitting: Internal Medicine

## 2021-08-05 ENCOUNTER — Inpatient Hospital Stay: Payer: Medicare Other | Admitting: Anesthesiology

## 2021-08-05 ENCOUNTER — Inpatient Hospital Stay: Payer: Medicare Other

## 2021-08-05 DIAGNOSIS — Z96642 Presence of left artificial hip joint: Secondary | ICD-10-CM

## 2021-08-05 DIAGNOSIS — K219 Gastro-esophageal reflux disease without esophagitis: Secondary | ICD-10-CM

## 2021-08-05 DIAGNOSIS — H5789 Other specified disorders of eye and adnexa: Secondary | ICD-10-CM

## 2021-08-05 DIAGNOSIS — M1612 Unilateral primary osteoarthritis, left hip: Secondary | ICD-10-CM | POA: Diagnosis not present

## 2021-08-05 DIAGNOSIS — J9601 Acute respiratory failure with hypoxia: Secondary | ICD-10-CM

## 2021-08-05 DIAGNOSIS — J849 Interstitial pulmonary disease, unspecified: Secondary | ICD-10-CM

## 2021-08-05 HISTORY — PX: TOTAL HIP ARTHROPLASTY: SHX124

## 2021-08-05 LAB — CBC
HCT: 29.4 % — ABNORMAL LOW (ref 36.0–46.0)
Hemoglobin: 9.5 g/dL — ABNORMAL LOW (ref 12.0–15.0)
MCH: 28.5 pg (ref 26.0–34.0)
MCHC: 32.3 g/dL (ref 30.0–36.0)
MCV: 88.3 fL (ref 80.0–100.0)
Platelets: 262 10*3/uL (ref 150–400)
RBC: 3.33 MIL/uL — ABNORMAL LOW (ref 3.87–5.11)
RDW: 15.7 % — ABNORMAL HIGH (ref 11.5–15.5)
WBC: 6.4 10*3/uL (ref 4.0–10.5)
nRBC: 0 % (ref 0.0–0.2)

## 2021-08-05 LAB — BASIC METABOLIC PANEL
Anion gap: 5 (ref 5–15)
BUN: 26 mg/dL — ABNORMAL HIGH (ref 8–23)
CO2: 30 mmol/L (ref 22–32)
Calcium: 8.8 mg/dL — ABNORMAL LOW (ref 8.9–10.3)
Chloride: 102 mmol/L (ref 98–111)
Creatinine, Ser: 0.96 mg/dL (ref 0.44–1.00)
GFR, Estimated: 59 mL/min — ABNORMAL LOW (ref >60)
Glucose, Bld: 91 mg/dL (ref 70–99)
Potassium: 4.3 mmol/L (ref 3.5–5.1)
Sodium: 137 mmol/L (ref 135–145)

## 2021-08-05 SURGERY — ARTHROPLASTY, HIP, TOTAL,POSTERIOR APPROACH
Anesthesia: General | Site: Hip | Laterality: Left

## 2021-08-05 MED ORDER — ACETAMINOPHEN 500 MG PO TABS
1000.0000 mg | ORAL_TABLET | Freq: Four times a day (QID) | ORAL | Status: AC
  Administered 2021-08-05 – 2021-08-06 (×4): 1000 mg via ORAL
  Filled 2021-08-05 (×4): qty 2

## 2021-08-05 MED ORDER — DEXAMETHASONE SODIUM PHOSPHATE 10 MG/ML IJ SOLN
INTRAMUSCULAR | Status: DC | PRN
  Administered 2021-08-05: 5 mg via INTRAVENOUS

## 2021-08-05 MED ORDER — SODIUM CHLORIDE FLUSH 0.9 % IV SOLN
INTRAVENOUS | Status: AC
  Filled 2021-08-05: qty 40

## 2021-08-05 MED ORDER — SODIUM CHLORIDE 0.9 % IV SOLN
INTRAVENOUS | Status: DC | PRN
  Administered 2021-08-05: 60 mL

## 2021-08-05 MED ORDER — FENTANYL CITRATE (PF) 100 MCG/2ML IJ SOLN
INTRAMUSCULAR | Status: DC | PRN
  Administered 2021-08-05 (×3): 25 ug via INTRAVENOUS

## 2021-08-05 MED ORDER — PROPOFOL 10 MG/ML IV BOLUS
INTRAVENOUS | Status: DC | PRN
  Administered 2021-08-05: 100 mg via INTRAVENOUS

## 2021-08-05 MED ORDER — PROPOFOL 10 MG/ML IV BOLUS
INTRAVENOUS | Status: AC
  Filled 2021-08-05: qty 40

## 2021-08-05 MED ORDER — DIPHENHYDRAMINE HCL 12.5 MG/5ML PO ELIX: 12.5000 mg | ORAL_SOLUTION | ORAL | Status: DC | PRN

## 2021-08-05 MED ORDER — SODIUM CHLORIDE 0.9 % IR SOLN
Status: DC | PRN
  Administered 2021-08-05: 3000 mL

## 2021-08-05 MED ORDER — BISACODYL 10 MG RE SUPP: 10.0000 mg | Freq: Every day | RECTAL | Status: DC | PRN

## 2021-08-05 MED ORDER — ONDANSETRON HCL 4 MG/2ML IJ SOLN: 4.0000 mg | Freq: Four times a day (QID) | INTRAMUSCULAR | Status: DC | PRN

## 2021-08-05 MED ORDER — ENOXAPARIN SODIUM 40 MG/0.4ML IJ SOSY
40.0000 mg | PREFILLED_SYRINGE | INTRAMUSCULAR | Status: DC
  Administered 2021-08-06 – 2021-08-11 (×5): 40 mg via SUBCUTANEOUS
  Filled 2021-08-05 (×5): qty 0.4

## 2021-08-05 MED ORDER — 0.9 % SODIUM CHLORIDE (POUR BTL) OPTIME
TOPICAL | Status: DC | PRN
  Administered 2021-08-05: 1000 mL

## 2021-08-05 MED ORDER — FLEET ENEMA 7-19 GM/118ML RE ENEM: 1.0000 | ENEMA | Freq: Once | RECTAL | Status: DC | PRN

## 2021-08-05 MED ORDER — ONDANSETRON HCL 4 MG/2ML IJ SOLN: 4.0000 mg | Freq: Once | INTRAMUSCULAR | Status: DC | PRN

## 2021-08-05 MED ORDER — BUPIVACAINE-EPINEPHRINE (PF) 0.5% -1:200000 IJ SOLN
INTRAMUSCULAR | Status: DC | PRN
  Administered 2021-08-05: 30 mL via PERINEURAL

## 2021-08-05 MED ORDER — ONDANSETRON HCL 4 MG PO TABS: 4.0000 mg | ORAL_TABLET | Freq: Four times a day (QID) | ORAL | Status: DC | PRN

## 2021-08-05 MED ORDER — LIDOCAINE HCL (CARDIAC) PF 100 MG/5ML IV SOSY
PREFILLED_SYRINGE | INTRAVENOUS | Status: DC | PRN
  Administered 2021-08-05: 60 mg via INTRAVENOUS

## 2021-08-05 MED ORDER — SODIUM CHLORIDE 0.9 % IV SOLN: INTRAVENOUS | Status: DC

## 2021-08-05 MED ORDER — METOCLOPRAMIDE HCL 5 MG/ML IJ SOLN: 5.0000 mg | Freq: Three times a day (TID) | INTRAMUSCULAR | Status: DC | PRN

## 2021-08-05 MED ORDER — OXYCODONE HCL 5 MG PO TABS
5.0000 mg | ORAL_TABLET | ORAL | Status: DC | PRN
  Administered 2021-08-05: 5 mg via ORAL
  Filled 2021-08-05: qty 1

## 2021-08-05 MED ORDER — PHENYLEPHRINE HCL (PRESSORS) 10 MG/ML IV SOLN
INTRAVENOUS | Status: AC
  Filled 2021-08-05: qty 1

## 2021-08-05 MED ORDER — STERILE WATER FOR IRRIGATION IR SOLN
Status: DC | PRN
  Administered 2021-08-05: 1000 mL

## 2021-08-05 MED ORDER — BUPIVACAINE LIPOSOME 1.3 % IJ SUSP
INTRAMUSCULAR | Status: AC
  Filled 2021-08-05: qty 20

## 2021-08-05 MED ORDER — ACETAMINOPHEN 325 MG PO TABS: 325.0000 mg | ORAL_TABLET | Freq: Four times a day (QID) | ORAL | Status: DC | PRN

## 2021-08-05 MED ORDER — LACTATED RINGERS IV SOLN: INTRAVENOUS | Status: DC | PRN

## 2021-08-05 MED ORDER — DOCUSATE SODIUM 100 MG PO CAPS
100.0000 mg | ORAL_CAPSULE | Freq: Two times a day (BID) | ORAL | Status: DC
  Administered 2021-08-05 – 2021-08-11 (×13): 100 mg via ORAL
  Filled 2021-08-05 (×13): qty 1

## 2021-08-05 MED ORDER — ROCURONIUM BROMIDE 100 MG/10ML IV SOLN
INTRAVENOUS | Status: DC | PRN
  Administered 2021-08-05: 50 mg via INTRAVENOUS

## 2021-08-05 MED ORDER — FENTANYL CITRATE (PF) 100 MCG/2ML IJ SOLN
INTRAMUSCULAR | Status: AC
  Filled 2021-08-05: qty 2

## 2021-08-05 MED ORDER — FENTANYL CITRATE (PF) 100 MCG/2ML IJ SOLN
25.0000 ug | INTRAMUSCULAR | Status: DC | PRN
  Administered 2021-08-05 (×3): 25 ug via INTRAVENOUS

## 2021-08-05 MED ORDER — MAGNESIUM HYDROXIDE 400 MG/5ML PO SUSP: 30.0000 mL | Freq: Every day | ORAL | Status: DC | PRN

## 2021-08-05 MED ORDER — FENTANYL CITRATE (PF) 100 MCG/2ML IJ SOLN
INTRAMUSCULAR | Status: AC
  Administered 2021-08-05: 25 ug via INTRAVENOUS
  Filled 2021-08-05: qty 2

## 2021-08-05 MED ORDER — METOCLOPRAMIDE HCL 10 MG PO TABS: 5.0000 mg | ORAL_TABLET | Freq: Three times a day (TID) | ORAL | Status: DC | PRN

## 2021-08-05 MED ORDER — SUGAMMADEX SODIUM 200 MG/2ML IV SOLN
INTRAVENOUS | Status: DC | PRN
  Administered 2021-08-05: 200 mg via INTRAVENOUS

## 2021-08-05 MED ORDER — BUPIVACAINE-EPINEPHRINE (PF) 0.5% -1:200000 IJ SOLN
INTRAMUSCULAR | Status: AC
  Filled 2021-08-05: qty 30

## 2021-08-05 MED ORDER — ONDANSETRON HCL 4 MG/2ML IJ SOLN
INTRAMUSCULAR | Status: DC | PRN
  Administered 2021-08-05: 4 mg via INTRAVENOUS

## 2021-08-05 MED ORDER — PHENYLEPHRINE HCL (PRESSORS) 10 MG/ML IV SOLN
INTRAVENOUS | Status: DC | PRN
  Administered 2021-08-05: 100 ug via INTRAVENOUS
  Administered 2021-08-05: 50 ug via INTRAVENOUS
  Administered 2021-08-05: 100 ug via INTRAVENOUS
  Administered 2021-08-05 (×2): 50 ug via INTRAVENOUS

## 2021-08-05 MED ORDER — CEFAZOLIN SODIUM-DEXTROSE 2-4 GM/100ML-% IV SOLN
2.0000 g | Freq: Four times a day (QID) | INTRAVENOUS | Status: AC
  Administered 2021-08-05 – 2021-08-06 (×3): 2 g via INTRAVENOUS

## 2021-08-05 MED ORDER — TRANEXAMIC ACID 1000 MG/10ML IV SOLN
INTRAVENOUS | Status: AC
  Filled 2021-08-05: qty 10

## 2021-08-05 MED ORDER — TRANEXAMIC ACID 1000 MG/10ML IV SOLN
INTRAVENOUS | Status: DC | PRN
  Administered 2021-08-05: 1000 mg via TOPICAL

## 2021-08-05 SURGICAL SUPPLY — 61 items
BIT DRILL RINGLOC QUICK CONN (BIT) ×1 IMPLANT
BLADE SAGITTAL WIDE XTHICK NO (BLADE) ×2 IMPLANT
BLADE SURG SZ20 CARB STEEL (BLADE) ×2 IMPLANT
CHLORAPREP W/TINT 26 (MISCELLANEOUS) ×4 IMPLANT
DRAPE 3/4 80X56 (DRAPES) ×2 IMPLANT
DRAPE IMP U-DRAPE 54X76 (DRAPES) ×2 IMPLANT
DRAPE INCISE IOBAN 66X60 STRL (DRAPES) ×4 IMPLANT
DRAPE SURG 17X11 SM STRL (DRAPES) ×4 IMPLANT
DRILL BIT RINGLOC QUICK CONN (BIT) ×1
DRSG MEPILEX SACRM 8.7X9.8 (GAUZE/BANDAGES/DRESSINGS) ×2 IMPLANT
DRSG OPSITE POSTOP 4X10 (GAUZE/BANDAGES/DRESSINGS) ×2 IMPLANT
ELECT BLADE 6.5 EXT (BLADE) ×2 IMPLANT
ELECT CAUTERY BLADE 6.4 (BLADE) ×2 IMPLANT
GAUZE 4X4 16PLY ~~LOC~~+RFID DBL (SPONGE) ×2 IMPLANT
GAUZE XEROFORM 1X8 LF (GAUZE/BANDAGES/DRESSINGS) ×2 IMPLANT
GLOVE SRG 8 PF TXTR STRL LF DI (GLOVE) ×1 IMPLANT
GLOVE SURG ENC MOIS LTX SZ7.5 (GLOVE) ×8 IMPLANT
GLOVE SURG ENC MOIS LTX SZ8 (GLOVE) ×8 IMPLANT
GLOVE SURG NEOP MICRO LF SZ7.5 (GLOVE) ×2 IMPLANT
GLOVE SURG UNDER LTX SZ8 (GLOVE) ×2 IMPLANT
GLOVE SURG UNDER POLY LF SZ8 (GLOVE) ×1
GOWN STRL REUS W/ TWL LRG LVL3 (GOWN DISPOSABLE) ×2 IMPLANT
GOWN STRL REUS W/ TWL XL LVL3 (GOWN DISPOSABLE) ×1 IMPLANT
GOWN STRL REUS W/TWL LRG LVL3 (GOWN DISPOSABLE) ×2
GOWN STRL REUS W/TWL XL LVL3 (GOWN DISPOSABLE) ×1
HEAD CERAMIC BIOLOX 36 T1 STD (Head) ×2 IMPLANT
HIP SHELL ACETAB 3H 50MM (Hips) ×2 IMPLANT
HOOD PEEL AWAY FLYTE STAYCOOL (MISCELLANEOUS) ×6 IMPLANT
IV NS IRRIG 3000ML ARTHROMATIC (IV SOLUTION) ×2 IMPLANT
KIT TURNOVER KIT A (KITS) ×2 IMPLANT
LINER ACE G7 36 SZ D HIGH WALL (Liner) ×2 IMPLANT
MANIFOLD NEPTUNE II (INSTRUMENTS) ×2 IMPLANT
MAT ABSORB  FLUID 56X50 GRAY (MISCELLANEOUS)
MAT ABSORB FLUID 56X50 GRAY (MISCELLANEOUS) IMPLANT
NDL SAFETY ECLIPSE 18X1.5 (NEEDLE) ×2 IMPLANT
NEEDLE FILTER BLUNT 18X 1/2SAF (NEEDLE) ×1
NEEDLE FILTER BLUNT 18X1 1/2 (NEEDLE) ×1 IMPLANT
NEEDLE HYPO 18GX1.5 SHARP (NEEDLE) ×2
NEEDLE SPNL 20GX3.5 QUINCKE YW (NEEDLE) ×2 IMPLANT
NS IRRIG 1000ML POUR BTL (IV SOLUTION) ×2 IMPLANT
PACK HIP PROSTHESIS (MISCELLANEOUS) ×2 IMPLANT
PENCIL SMOKE EVACUATOR (MISCELLANEOUS) ×2 IMPLANT
PIN STEINMAN 3/16 (PIN) ×2 IMPLANT
PULSAVAC PLUS IRRIG FAN TIP (DISPOSABLE) ×2
SHELL ACETAB HIP 3H 50MM (Hips) ×1 IMPLANT
SPONGE T-LAP 18X18 ~~LOC~~+RFID (SPONGE) ×10 IMPLANT
ST PIN 3/16X9 BAY 6PK (Pin) ×1 IMPLANT
STAPLER SKIN PROX 35W (STAPLE) ×2 IMPLANT
STEM COLLARLESS FULL 11X135 (Stem) ×2 IMPLANT
SUT TICRON 2-0 30IN 311381 (SUTURE) ×8 IMPLANT
SUT VIC AB 0 CT1 36 (SUTURE) ×2 IMPLANT
SUT VIC AB 1 CT1 36 (SUTURE) ×2 IMPLANT
SUT VIC AB 2-0 CT1 (SUTURE) ×6 IMPLANT
SYR 10ML LL (SYRINGE) ×2 IMPLANT
SYR 20ML LL LF (SYRINGE) ×2 IMPLANT
SYR 30ML LL (SYRINGE) ×4 IMPLANT
TAPE TRANSPORE STRL 2 31045 (GAUZE/BANDAGES/DRESSINGS) ×2 IMPLANT
TIP FAN IRRIG PULSAVAC PLUS (DISPOSABLE) ×1 IMPLANT
TRAP FLUID SMOKE EVACUATOR (MISCELLANEOUS) IMPLANT
WATER STERILE IRR 1000ML POUR (IV SOLUTION) ×2 IMPLANT
WATER STERILE IRR 500ML POUR (IV SOLUTION) IMPLANT

## 2021-08-05 NOTE — Transfer of Care (Signed)
Immediate Anesthesia Transfer of Care Note  Patient: Melody Ochoa  Procedure(s) Performed: TOTAL HIP ARTHROPLASTY (Left: Hip)  Patient Location: PACU  Anesthesia Type:General  Level of Consciousness: awake and drowsy  Airway & Oxygen Therapy: Patient Spontanous Breathing and Patient connected to face mask oxygen  Post-op Assessment: Report given to RN and Post -op Vital signs reviewed and stable  Post vital signs: Reviewed and stable  Last Vitals:  Vitals Value Taken Time  BP 110/62 08/05/21 1000  Temp    Pulse 76 08/05/21 1003  Resp 18 08/05/21 1003  SpO2 100 % 08/05/21 1003  Vitals shown include unvalidated device data.  Last Pain:  Vitals:   08/05/21 0711  TempSrc:   PainSc: 0-No pain      Patients Stated Pain Goal: 4 ([id number])  Complications: No notable events documented.

## 2021-08-05 NOTE — Progress Notes (Signed)
Patient ID: Melody Ochoa, female   DOB: 06/17/1941, 80 y.o.   MRN: [id number] [facility] PROGRESS NOTE  Melody Ochoa [id number] DOB: May 23, 1941 DOA: 07/30/2021 PCP: Burnard Hawthorne, FNP  HPI/Subjective: Patient feeling in her left eye is itchy and scratchy after surgery.  Pronation today for inability to ambulate and left hip pain.  Patient initially admitted 6 days ago with a fall and hip pain.  Objective: Vitals:   08/05/21 1100 08/05/21 1121  BP:  (!) 119/52  Pulse:  81  Resp:  16  Temp: 97.9 F (36.6 C) 98 F (36.7 C)  SpO2:  94%    Intake/Output Summary (Last 24 hours) at 08/05/2021 1415 Last data filed at 08/05/2021 0955 Gross per 24 hour  Intake 600 ml  Output 1000 ml  Net -400 ml   Filed Weights   07/30/21 1246  Weight: 81.6 kg    ROS: Review of Systems  Respiratory:  Negative for shortness of breath.   Cardiovascular:  Negative for chest pain.  Gastrointestinal:  Negative for abdominal pain, nausea and vomiting.  Exam: Physical Exam HENT:     Head: Normocephalic.     Mouth/Throat:     Pharynx: No oropharyngeal exudate.  Eyes:     General: Lids are normal.     Conjunctiva/sclera: Conjunctivae normal.     Pupils: Pupils are equal, round, and reactive to light.  Cardiovascular:     Rate and Rhythm: Normal rate and regular rhythm.     Heart sounds: Normal heart sounds, S1 normal and S2 normal.  Pulmonary:     Breath sounds: Normal breath sounds. No decreased breath sounds, wheezing, rhonchi or rales.  Abdominal:     Palpations: Abdomen is soft.     Tenderness: There is no abdominal tenderness.  Musculoskeletal:     Right ankle: No swelling.     Left ankle: No swelling.  Skin:    General: Skin is warm.     Findings: No rash.  Neurological:     Mental Status: She is alert and oriented to person, place, and time.      Scheduled Meds:  acetaminophen  1,000 mg Oral Q6H   amLODipine  5 mg Oral Daily   aspirin EC  81 mg Oral Daily    atorvastatin  80 mg Oral Daily   buPROPion  300 mg Oral Daily   docusate sodium  100 mg Oral BID   DULoxetine  30 mg Oral Daily   [START ON 08/06/2021] enoxaparin (LOVENOX) injection  40 mg Subcutaneous Q24H   famotidine  20 mg Oral QHS   feeding supplement  237 mL Oral TID BM   fluticasone  2 spray Each Nare Daily   multivitamin with minerals  1 tablet Oral Daily   pregabalin  50 mg Oral Daily   And   pregabalin  100 mg Oral QHS   Continuous Infusions:  sodium chloride 75 mL/hr at 08/05/21 1226    ceFAZolin (ANCEF) IV 2 g (08/05/21 1350)    Assessment/Plan:  Mechanical fall with left hip pain and inability to ambulate.  Patient is status post left hip replacement today.  Physical therapy evaluation.  Likely will end up needing some rehab. Left eye irritation, postop.  Natural tears. Acute hypoxic respiratory failure.  Patient on room air.  This has resolved. Interstitial lung disease. Hyperlipidemia unspecified on Lipitor GERD on H2 blocker Peripheral neuropathy on [place] Normocytic anemia.        Code Status:  Code Status Orders  (From admission, onward)           Start     Ordered   07/31/21 0214  Full code  Continuous        07/31/21 0214           Code Status History     Date Active Date Inactive Code Status Order ID Comments User Context   12/09/2020 2055 12/12/2020 1927 Full Code [id number]  Athena Masse, MD ED   09/30/2019 1229 10/01/2019 1855 Full Code [id number]  Epifanio Lesches, MD ED   06/13/2019 1259 06/15/2019 1621 Full Code [id number]  Lang Snow, NP ED   08/10/2017 0128 08/13/2017 1828 Full Code [id number]  Greta Doom, MD Inpatient   11/22/2014 0347 11/25/2014 1750 Full Code [id number]  [name], MD Inpatient   11/18/2014 1859 11/20/2014 1446 Full Code [id number]  Newman Pies, MD Inpatient      Family Communication: Spoke with husband at the bedside Disposition Plan: Status is: Inpatient  Dispo:  The patient is from: Home              Anticipated d/c is to: Home              Patient currently postop day 0 for left hip replacement   Difficult to place patient.  No.  Consultants: Orthopedic surgery  Procedures: Left hip replacement  Time spent: 28 minutes  [place]

## 2021-08-05 NOTE — Anesthesia Preprocedure Evaluation (Signed)
Anesthesia Evaluation  Patient identified by MRN, date of birth, ID band Patient awake    Reviewed: Allergy & Precautions, H&P , NPO status , Patient's Chart, lab work & pertinent test results, reviewed documented beta blocker date and time   History of Anesthesia Complications (+) PONV and history of anesthetic complications  Airway Mallampati: III  TM Distance: >3 FB Neck ROM: full    Dental  (+) Teeth Intact   Pulmonary neg pulmonary ROS, former smoker,    Pulmonary exam normal        Cardiovascular Exercise Tolerance: Poor hypertension, Normal cardiovascular exam+ Valvular Problems/Murmurs  Rhythm:regular Rate:Normal     Neuro/Psych PSYCHIATRIC DISORDERS Anxiety Depression  Neuromuscular disease CVA    GI/Hepatic Neg liver ROS, GERD  Medicated,  Endo/Other  negative endocrine ROS  Renal/GU Renal disease  negative genitourinary   Musculoskeletal   Abdominal   Peds  Hematology  (+) Blood dyscrasia, anemia ,   Anesthesia Other Findings Past Medical History: No date: Arthritis 10/18/2017: Atheromatous plaque No date: Back pain No date: Cancer [facility])     Comment:  skin No date: Depression No date: Diverticulosis No date: Dizziness     Comment:  patient had episode of dizziness when came in room. hx               past no dx No date: GERD (gastroesophageal reflux disease) No date: Heart murmur 10/18/2017: Hyperlipidemia No date: PONV (postoperative nausea and vomiting)     Comment:  yrs ago 11/16/2016: Squamous cell carcinoma of skin     Comment:  L chest parasternal 02/05/2020: Squamous cell carcinoma of skin     Comment:  L cheek inf to lat zygoma No date: Stroke [facility]) Past Surgical History: 13: BACK SURGERY No date: BREAST BIOPSY; Bilateral     Comment:  cores "years ago" 90's: BREAST EXCISIONAL BIOPSY; Left 12/20/2018: CATARACT EXTRACTION W/PHACO; Left     Comment:  Procedure: CATARACT EXTRACTION  PHACO AND INTRAOCULAR               LENS PLACEMENT ([place]) LEFT TOPICAL;  Surgeon: Leandrew Koyanagi, MD;  Location: [place];  Service:               Ophthalmology;  Laterality: Left; 01/24/2019: CATARACT EXTRACTION W/PHACO; Right     Comment:  Procedure: CATARACT EXTRACTION PHACO AND INTRAOCULAR               LENS PLACEMENT ([place]) RIGHT;  Surgeon: Leandrew Koyanagi, MD;  Location: [place];  Service:               Ophthalmology;  Laterality: Right; 09: CERVICAL DISC SURGERY 01/13/2016: COLONOSCOPY WITH PROPOFOL; N/A     Comment:  Procedure: COLONOSCOPY WITH PROPOFOL;  Surgeon: [name], MD;  Location: [facility] ENDOSCOPY;  Service: Endoscopy;              Laterality: N/A; 02/19/2020: COLONOSCOPY WITH PROPOFOL; N/A     Comment:  Procedure: COLONOSCOPY WITH PROPOFOL;  Surgeon: [name], MD;  Location: [facility] ENDOSCOPY;  Service:               Gastroenterology;  Laterality: N/A; 94:  FACELIFT 08/23/2017: LOOP RECORDER INSERTION; N/A     Comment:  Procedure: LOOP RECORDER INSERTION;  Surgeon: Thompson Grayer, MD;  Location: [place] [facility];  Service:               Cardiovascular;  Laterality: N/A; 08/12/2017: TEE WITHOUT CARDIOVERSION; N/A     Comment:  Procedure: TRANSESOPHAGEAL ECHOCARDIOGRAM (TEE);                Surgeon: Sueanne Margarita, MD;  Location: [facility] ENDOSCOPY;                Service: Cardiovascular;  Laterality: N/A; No date: TONSILLECTOMY 04/22/2016: TOTAL HIP ARTHROPLASTY; Right     Comment:  Procedure: TOTAL HIP ARTHROPLASTY;  Surgeon: Corky Mull, MD;  Location: [facility] ORS;  Service: Orthopedics;                Laterality: Right; BMI    Body Mass Index: 30.90 kg/m     Reproductive/Obstetrics negative OB ROS                             Anesthesia Physical Anesthesia Plan  ASA: 3  Anesthesia Plan: General ETT   Post-op Pain Management:     Induction:   PONV Risk Score and Plan: 4 or greater  Airway Management Planned:   Additional Equipment:   Intra-op Plan:   Post-operative Plan:   Informed Consent: I have reviewed the patients History and Physical, chart, labs and discussed the procedure including the risks, benefits and alternatives for the proposed anesthesia with the patient or authorized representative who has indicated his/her understanding and acceptance.     Dental Advisory Given  Plan Discussed with: CRNA  Anesthesia Plan Comments:         Anesthesia Quick Evaluation

## 2021-08-05 NOTE — Anesthesia Procedure Notes (Signed)
Procedure Name: Intubation Date/Time: 08/05/2021 7:39 AM Performed by: Posey Pronto, Aarion Kittrell, CRNA Pre-anesthesia Checklist: Patient identified, Patient being monitored, Timeout performed, Emergency Drugs available and Suction available Patient Re-evaluated:Patient Re-evaluated prior to induction Oxygen Delivery Method: Circle system utilized Preoxygenation: Pre-oxygenation with 100% oxygen Induction Type: IV induction Ventilation: Mask ventilation without difficulty and Oral airway inserted - appropriate to patient size Laryngoscope Size: 3 and McGraph Grade View: Grade I Tube type: Oral Tube size: 7.0 mm Number of attempts: 1 Airway Equipment and Method: Stylet Placement Confirmation: ETT inserted through vocal cords under direct vision, positive ETCO2 and breath sounds checked- equal and bilateral Secured at: 21 cm Tube secured with: Tape Dental Injury: Teeth and Oropharynx as per pre-operative assessment  Comments: Eyes taped prior to intubation

## 2021-08-05 NOTE — Op Note (Signed)
08/05/2021  10:08 AM  Patient:   Melody Ochoa  Pre-Op Diagnosis:   Severe degenerative joint disease, left hip.  Post-Op Diagnosis:   Same.  Procedure:   Left total hip arthroplasty.  Surgeon:   Pascal Lux, MD  Assistant:   Cameron Proud, PA-C  Anesthesia:   GET  Findings:   As above.  Complications:   None  EBL:   200 cc  Fluids:   600 cc crystalloid  UOP:   None  TT:   None  Drains:   None  Closure:   Staples  Implants:   Biomet press-fit system with a #11 laterally offset Echo femoral stem, a 50 mm acetabular shell with an E-poly hi-wall liner, and a 36 mm ceramic head with a +0 mm neck.  Brief Clinical Note:   The patient is an 80 year old female with a history of progressive worsening left hip pain and stiffness. Her symptoms have progressed despite medications, activity modification, etc. Her history and examination are consistent with advanced degenerative joint disease, confirmed by preoperative x-rays and MRI scanning. Her symptoms have progressed to the point where she is now unable to ambulate at home without severe pain. The patient presents at this time for a left total hip arthroplasty.  Procedure:   The patient was brought into the operating room and lain in the supine position. After adequate general endotracheal intubation and anesthesia was obtained, the patient was repositioned in the right lateral decubitus position and secured using a lateral hip positioner. The left hip and lower extremity were prepped with ChloroPrep solution before being draped sterilely. Preoperative antibiotics were administered. A timeout was performed to verify the appropriate surgical site.    A standard posterior approach to the hip was made through an approximately 7-8 inch incision. The incision was carried down through the subcutaneous tissues to expose the gluteal fascia and proximal end of the iliotibial band. These structures were split the length of the incision and  the Charnley self-retaining hip retractor placed. The bursal tissues were swept posteriorly to expose the short external rotators. The anterior border of the piriformis tendon was identified and this plane developed down through the capsule to enter the joint. A flap of tissue was elevated off the posterior aspect of the femoral neck and greater trochanter and retracted posteriorly. This flap included the piriformis tendon, the short external rotators, and the posterior capsule. The soft tissues were elevated off the lateral aspect of the ilium and a large Steinmann pin placed bicortically.   With the left leg aligned over the right, a drill bit was placed into the greater trochanter parallel to the Steinmann pin and the distance between these two pins measured in order to optimize leg lengths postoperatively. The drill bit was removed and the hip dislocated. The piriformis fossa was debrided of soft tissues before the intramedullary canal was accessed through this point using a triple step reamer. The canal was reamed sequentially beginning with a #7 tapered reamer and progressing to a #11 tapered reamer. This provided excellent circumferential chatter. Using the appropriate guide, a femoral neck cut was made 10-12 mm above the lesser trochanter. The femoral head was removed.  Attention was directed to the acetabular side. The labrum was debrided circumferentially before the ligamentum teres was removed using a large curette. A line was drawn on the drapes corresponding to the native version of the acetabulum. This line was used as a guide while the acetabulum was reamed sequentially beginning with a  43 mm reamer and progressing to a 49 mm reamer. This provided excellent circumferential chatter. The 50 mm trial acetabulum was positioned and found to fit quite well. Therefore, the 50 mm acetabular shell was selected and impacted into place with care taken to maintain the appropriate version. The trial high wall  liner was inserted.  Attention was redirected to the femoral side. A box osteotome was used to establish version before the canal was broached sequentially beginning with a #9 broach and progressing to a #11 broach. This was left in place and several trial reductions performed using both a standard and laterally offset neck options, as well as the -6 mm and +0 mm neck lengths. After removing the trial components, the "manhole cover" was placed into the apex of the acetabular shell and tightened securely. The permanent E-polyethylene hi-wall liner was impacted into the acetabular shell and its locking mechanism verified using a quarter-inch osteotome. Next, the #11 laterally offset femoral stem was impacted into place with care taken to maintain the appropriate version. A repeat trial reduction was performed using the +0 mm neck length. The +0 mm neck length demonstrated excellent stability both in extension and external rotation as well as with flexion to 90 and internal rotation beyond 70. It also was stable in the position of sleep. In addition, leg lengths appeared to be restored appropriately, both by reassessing the position of the right leg over the left, as well as by measuring the distance between the Steinmann pin and the drill bit. The 36 mm ceramic head with the +0 mm neck was impacted onto the stem of the femoral component. The Morse taper locking mechanism was verified using manual distraction before the head was relocated and placed through a range of motion with the findings as described above.  The wound was copiously irrigated with sterile saline solution via the jet lavage system before the peri-incisional and pericapsular tissues were injected with 30 cc of 0.5% Sensorcaine with epinephrine and 20 cc of Exparel diluted out to 60 cc with normal saline to help with postoperative analgesia. The posterior flap was reapproximated to the posterior aspect of the greater trochanter using #2 Tycron  interrupted sutures placed through drill holes. Several additional #2 Tycron interrupted sutures were used to reinforce this layer of closure. The iliotibial band was reapproximated using #1 Vicryl interrupted sutures before the gluteal fascia was closed using a running #1 Vicryl suture. At this point, 1 g of transexemic acid in 10 cc of normal saline was injected into the joint to help reduce postoperative bleeding. The subcutaneous tissues were closed in several layers using 2-0 Vicryl interrupted sutures before the skin was closed using staples. A sterile occlusive dressing was applied to the wound. The patient was then rolled back into the supine position on her hospital bed before being awakened, extubated, and returned to the recovery room in satisfactory condition after tolerating the procedure well.

## 2021-08-05 NOTE — Evaluation (Signed)
Physical Therapy Re-Evaluation Patient Details Name: Melody Ochoa MRN: [id number] DOB: 11/28/1941 Today's Date: 08/05/2021   History of Present Illness  Pt is an 80 yo female with onset of fall with injury to R shoulder and L hip with imaging revealing osteopenia and severe, bone-on-bone arthrosis of the left hip.  Pt underwent elective L THA 08/05/21.   PMH includes: L hip OA, R THA, osteopenia, depression, dyslipidemia, ILD, GERD, diverticulosis, chronic back pain, fibrotic lung disease, L rib fractures, posterior cervical fusion, and cerebral cortical infarcts.   Clinical Impression  Pt was pleasant and motivated to participate during the session and put forth good effort throughout.  Pt required significant physical assistance with all functional tasks despite her best effort and is at a very high risk for falls.  Pt would not be safe to return to her prior living situation and will benefit from PT services in a SNF setting upon discharge to safely address deficits listed in patient problem list for decreased caregiver assistance and eventual return to PLOF.      Follow Up Recommendations SNF    Equipment Recommendations  None recommended by PT    Recommendations for Other Services       Precautions / Restrictions Precautions Precautions: Fall;Posterior Hip Precaution Booklet Issued: Yes (comment) Restrictions Weight Bearing Restrictions: Yes LLE Weight Bearing: Weight bearing as tolerated      Mobility  Bed Mobility Overal bed mobility: Needs Assistance Bed Mobility: Supine to Sit     Supine to sit: Mod assist;HOB elevated     General bed mobility comments: Mod A for LLE and trunk control    Transfers Overall transfer level: Needs assistance Equipment used: Rolling walker (2 wheeled) Transfers: Sit to/from Stand Sit to Stand: Mod assist;From elevated surface         General transfer comment: Mod verbal cues for sequencing for hip precaution  compliance  Ambulation/Gait Ambulation/Gait assistance: Min assist Gait Distance (Feet): 3 Feet Assistive device: Rolling walker (2 wheeled) Gait Pattern/deviations: Step-to pattern;Antalgic;Decreased stance time - left Gait velocity: decreased   General Gait Details: Pt only able to take several effortful, antalgic steps from the EOB to the recliner before needing to return to sitting and required min A for stability as well as RW management while ambulating  Stairs            Wheelchair Mobility    Modified Rankin (Stroke Patients Only)       Balance Overall balance assessment: Needs assistance;History of Falls Sitting-balance support: Feet supported Sitting balance-Leahy Scale: Fair     Standing balance support: Bilateral upper extremity supported;During functional activity Standing balance-Leahy Scale: Poor Standing balance comment: Min A for stability during gait                             Pertinent Vitals/Pain Pain Assessment: 0-10 Pain Score: 6  Pain Location: L hip Pain Descriptors / Indicators: Aching;Sore Pain Intervention(s): Premedicated before session;Monitored during session    [place] expects to be discharged to:: Private residence Living Arrangements: Spouse/significant other Available Help at Discharge: Family;Available 24 hours/day Type of Home: House Home Access: Stairs to enter Entrance Stairs-Rails: Right;Left;Can reach both Entrance Stairs-Number of Steps: 2 Home Layout: One level Home Equipment: Walker - 2 wheels;Cane - single point;Bedside commode;Shower seat - built in      Prior Function Level of Independence: Independent with assistive device(s)         Comments:  Mod I ambulation with a RW or SPC but more often with the RW     Hand Dominance   Dominant Hand: Right    Extremity/Trunk Assessment   Upper Extremity Assessment Upper Extremity Assessment: Generalized weakness    Lower  Extremity Assessment Lower Extremity Assessment: Generalized weakness;LLE deficits/detail LLE Deficits / Details: BLE ankle AROM, strength, and sensation to light touch grossly intact LLE: Unable to fully assess due to pain LLE Sensation: WNL       Communication   Communication: No difficulties  Cognition Arousal/Alertness: Awake/alert Behavior During Therapy: WFL for tasks assessed/performed Overall Cognitive Status: Within Functional Limits for tasks assessed                                        General Comments      Exercises Total Joint Exercises Ankle Circles/Pumps: AROM;Strengthening;Both;10 reps Quad Sets: Strengthening;Both;10 reps Gluteal Sets: 10 reps;Both;Strengthening Hip ABduction/ADduction: AAROM;Both;5 reps Straight Leg Raises: Both;5 reps Long Arc Quad: AROM;Strengthening;Both;10 reps Other Exercises Other Exercises: HEP education per handout Other Exercises: Posterior hip precaution education/review with pt and spouse verbally and during functional tasks   Assessment/Plan    PT Assessment Patient needs continued PT services  PT Problem List Decreased strength;Decreased activity tolerance;Decreased balance;Decreased mobility;Decreased knowledge of use of DME;Pain;Decreased knowledge of precautions       PT Treatment Interventions DME instruction;Gait training;Stair training;Functional mobility training;Therapeutic activities;Therapeutic exercise;Balance training;Patient/family education    PT Goals (Current goals can be found in the Care Plan section)  Acute Rehab PT Goals Patient Stated Goal: To get back to being independent PT Goal Formulation: With patient Time For Goal Achievement: 08/18/21 Potential to Achieve Goals: Fair    Frequency BID   Barriers to discharge Inaccessible home environment;Decreased caregiver support      Co-evaluation               AM-PAC PT "6 Clicks" Mobility  Outcome Measure Help needed  turning from your back to your side while in a flat bed without using bedrails?: A Lot Help needed moving from lying on your back to sitting on the side of a flat bed without using bedrails?: A Lot Help needed moving to and from a bed to a chair (including a wheelchair)?: A Lot Help needed standing up from a chair using your arms (e.g., wheelchair or bedside chair)?: A Lot Help needed to walk in hospital room?: Total Help needed climbing 3-5 steps with a railing? : Total 6 Click Score: 10    End of Session Equipment Utilized During Treatment: Gait belt Activity Tolerance: Patient limited by pain Patient left: in chair;with call bell/phone within reach;with chair alarm set;with SCD's reapplied Nurse Communication: Mobility status;Weight bearing status;Precautions PT Visit Diagnosis: Unsteadiness on feet (R26.81);Muscle weakness (generalized) (M62.81);Other abnormalities of gait and mobility (R26.89);Pain;History of falling (Z91.81) Pain - Right/Left: Left Pain - part of body: Hip    Time: [id number] PT Time Calculation (min) (ACUTE ONLY): 41 min   Charges:   PT Evaluation $PT Re-evaluation: 1 Re-eval PT Treatments $Therapeutic Exercise: 8-22 mins $Therapeutic Activity: 8-22 mins        D. Scott Fulton Merry PT, DPT 08/05/21, 4:18 PM

## 2021-08-05 NOTE — Telephone Encounter (Signed)
I was able to reach Dr. Trena Platt office & message has been sent to his nurse. She is currently admitted at [facility] due to fall.

## 2021-08-06 ENCOUNTER — Other Ambulatory Visit: Payer: Self-pay | Admitting: Family

## 2021-08-06 ENCOUNTER — Other Ambulatory Visit: Payer: Self-pay | Admitting: Internal Medicine

## 2021-08-06 DIAGNOSIS — I959 Hypotension, unspecified: Secondary | ICD-10-CM

## 2021-08-06 DIAGNOSIS — D62 Acute posthemorrhagic anemia: Secondary | ICD-10-CM

## 2021-08-06 LAB — CBC
HCT: 25.1 % — ABNORMAL LOW (ref 36.0–46.0)
Hemoglobin: 7.9 g/dL — ABNORMAL LOW (ref 12.0–15.0)
MCH: 27.9 pg (ref 26.0–34.0)
MCHC: 31.5 g/dL (ref 30.0–36.0)
MCV: 88.7 fL (ref 80.0–100.0)
Platelets: 235 10*3/uL (ref 150–400)
RBC: 2.83 MIL/uL — ABNORMAL LOW (ref 3.87–5.11)
RDW: 15.7 % — ABNORMAL HIGH (ref 11.5–15.5)
WBC: 8.6 10*3/uL (ref 4.0–10.5)
nRBC: 0 % (ref 0.0–0.2)

## 2021-08-06 LAB — BASIC METABOLIC PANEL
Anion gap: 7 (ref 5–15)
BUN: 26 mg/dL — ABNORMAL HIGH (ref 8–23)
CO2: 26 mmol/L (ref 22–32)
Calcium: 8.2 mg/dL — ABNORMAL LOW (ref 8.9–10.3)
Chloride: 103 mmol/L (ref 98–111)
Creatinine, Ser: 1.07 mg/dL — ABNORMAL HIGH (ref 0.44–1.00)
GFR, Estimated: 52 mL/min — ABNORMAL LOW (ref >60)
Glucose, Bld: 94 mg/dL (ref 70–99)
Potassium: 4.1 mmol/L (ref 3.5–5.1)
Sodium: 136 mmol/L (ref 135–145)

## 2021-08-06 LAB — PREPARE RBC (CROSSMATCH)

## 2021-08-06 LAB — SURGICAL PATHOLOGY

## 2021-08-06 MED ORDER — SODIUM CHLORIDE 0.9 % IV BOLUS
500.0000 mL | Freq: Once | INTRAVENOUS | Status: AC
  Administered 2021-08-06: 500 mL via INTRAVENOUS

## 2021-08-06 MED ORDER — SODIUM CHLORIDE 0.9% IV SOLUTION: Freq: Once | INTRAVENOUS | Status: AC

## 2021-08-06 MED ORDER — ACETAMINOPHEN 325 MG PO TABS
650.0000 mg | ORAL_TABLET | Freq: Once | ORAL | Status: AC
  Administered 2021-08-06: 650 mg via ORAL
  Filled 2021-08-06: qty 2

## 2021-08-06 NOTE — Progress Notes (Signed)
  Subjective: 1 Day Post-Op Procedure(s) (LRB): TOTAL HIP ARTHROPLASTY (Left) Patient reports pain as moderate.   Patient is well, and has had no acute complaints or problems PT and care management to assist with discharge planning. Negative for chest pain and shortness of breath Fever: no Gastrointestinal:NEgative for nausea and vomiting  Objective: Vital signs in last 24 hours: Temp:  [97.1 F (36.2 C)-98.9 F (37.2 C)] 98.8 F (37.1 C) (09/01 0216) Pulse Rate:  [77-155] 99 (09/01 0216) Resp:  [9-26] 18 (09/01 0216) BP: (98-119)/(52-64) 114/59 (09/01 0216) SpO2:  [81 %-100 %] 97 % (09/01 0216)  Intake/Output from previous day:  Intake/Output Summary (Last 24 hours) at 08/06/2021 0725 Last data filed at 08/06/2021 0616 Gross per 24 hour  Intake 840 ml  Output 1000 ml  Net -160 ml    Intake/Output this shift: No intake/output data recorded.  Labs: Recent Labs    08/04/21 0520 08/05/21 0405 08/06/21 0450  HGB 9.7* 9.5* 7.9*   Recent Labs    08/05/21 0405 08/06/21 0450  WBC 6.4 8.6  RBC 3.33* 2.83*  HCT 29.4* 25.1*  PLT 262 235   Recent Labs    08/05/21 0405 08/06/21 0450  NA 137 136  K 4.3 4.1  CL 102 103  CO2 30 26  BUN 26* 26*  CREATININE 0.96 1.07*  GLUCOSE 91 94  CALCIUM 8.8* 8.2*   No results for input(s): LABPT, INR in the last 72 hours.   EXAM General - Patient is Alert, Appropriate, and Oriented Extremity - ABD soft Sensation intact distally Dorsiflexion/Plantar flexion intact Incision: scant drainage No cellulitis present Dressing/Incision - blood tinged drainage with mild ecchymosis. Motor Function - intact, moving foot and toes well on exam.  Abdomen soft with normal bowel sounds.  Past Medical History:  Diagnosis Date   Arthritis    Atheromatous plaque 10/18/2017   Back pain    Cancer ([place])    skin   Depression    Diverticulosis    Dizziness    patient had episode of dizziness when came in room. hx past no dx   GERD  (gastroesophageal reflux disease)    Heart murmur    Hyperlipidemia 10/18/2017   PONV (postoperative nausea and vomiting)    yrs ago   Squamous cell carcinoma of skin 11/16/2016   L chest parasternal   Squamous cell carcinoma of skin 02/05/2020   L cheek inf to lat zygoma   Stroke (HCC)     Assessment/Plan: 1 Day Post-Op Procedure(s) (LRB): TOTAL HIP ARTHROPLASTY (Left) Active Problems:   Status post total hip replacement, left   Acute hip pain, left   Irritation of left eye   Acute respiratory failure with hypoxia (HCC)   Interstitial lung disease (HCC)   Gastroesophageal reflux disease without esophagitis  Estimated body mass index is 30.9 kg/m as calculated from the following:   Height as of this encounter: '5\' 4"'$  (1.626 m).   Weight as of this encounter: 81.6 kg. Advance diet Up with therapy D/C IV fluids when tolerating po intake.  Labs and vitals reviewed.  Hg 7.9 this AM. Up with therapy today, currently recommending SNF. Begin working on BM.   Can re-start Plavix from Ortho perspective but defer to hospitalist.  DVT Prophylaxis - Lovenox, Foot Pumps, and TED hose Weight-Bearing as tolerated to left leg  J. Cameron Proud, PA-C [facility] 08/06/2021, 7:25 AM

## 2021-08-06 NOTE — Progress Notes (Signed)
Physical Therapy Treatment Patient Details Name: Melody Ochoa MRN: [id number] DOB: 02/07/1941 Today's Date: 08/06/2021    History of Present Illness Pt is an 80 yo female with onset of fall with injury to R shoulder and L hip with imaging revealing osteopenia and severe, bone-on-bone arthrosis of the left hip.  Pt underwhent elective L THA 08/05/21.   PMH includes: L hip OA, R THA, osteopenia, depression, dyslipidemia, ILD, GERD, diverticulosis, chronic back pain, fibrotic lung disease, L rib fractures, posterior cervical fusion, cerebral cortical infarcts,    PT Comments    Patient was reclined, sitting up in chair on arrival to room. Blood pressure taken in reclined seated position was 89/55. Sat patient to upright trunk position and blood pressure was 74/45 (MAP 55) and patient reports feeling dizziness. Patient was able to participate in LE therapeutic exercises while in the chair. Unable to safely perform standing transfer due to low blood pressure. Assisted patient back to bed with lateral transfer from recliner chair to bed with +2 person assistance while patient in supine position. Blood pressure at end of session was 92/54. Patient able to recall 2/3 hip precautions with education provided. Alerted MD and nurse about blood pressure concerns.  PT will continue to follow to maximize independence.    Follow Up Recommendations  SNF     Equipment Recommendations  None recommended by PT    Recommendations for Other Services       Precautions / Restrictions Precautions Precautions: Posterior Hip;Fall Precaution Booklet Issued: Yes (comment) Restrictions Weight Bearing Restrictions: Yes LLE Weight Bearing: Weight bearing as tolerated    Mobility  Bed Mobility Overal bed mobility: Needs Assistance Bed Mobility: Supine to Sit     Supine to sit: Mod assist     General bed mobility comments: Mod A for LLE and trunk control    Transfers Overall transfer level: Needs  assistance Equipment used: Rolling walker (2 wheeled) Transfers: Sit to/from Stand Sit to Stand: Mod assist;From elevated surface         General transfer comment: patient reclined in chair on arrival to room. blood pressure taken at 89/55 while reclined. patient sat upright in the chair with blood pressure of 74/45 (MAP 55). patient insistent on getting back to bed due to not feeling well and pain. stand pivot deferred as blood pressure unsafe for mobility efforts. patient was total assistance +2 person for a lateral transfer from recliner to chair. recliner chair placed in flat position with rail down and used blanket to slide patient to bed.  Ambulation/Gait                 Stairs             Wheelchair Mobility    Modified Rankin (Stroke Patients Only)       Balance Overall balance assessment: Needs assistance;History of Falls Sitting-balance support: Feet supported Sitting balance-Leahy Scale: Fair Sitting balance - Comments: pt leans toward R hip sitting EOB to relieve pressure from L hip Postural control: Right lateral lean Standing balance support: Bilateral upper extremity supported;During functional activity Standing balance-Leahy Scale: Poor Standing balance comment: mod A with RW with hip flexion, knee flexion and R lateral lean onto R forearm on walker                            Cognition Arousal/Alertness: Lethargic Behavior During Therapy: WFL for tasks assessed/performed Overall Cognitive Status: Within Functional Limits for tasks assessed  Area of Impairment: Memory;Attention;Awareness;Problem solving;Following commands;Safety/judgement                   Current Attention Level: Selective Memory: Decreased short-term memory   Safety/Judgement: Decreased awareness of safety   Problem Solving: Slow processing;Requires verbal cues General Comments: patient able to follow commands with increased time. increased time for  following commands      Exercises Total Joint Exercises Ankle Circles/Pumps: AROM;Strengthening;Both;20 reps;Seated (chair in reclined position) Quad Sets: AROM;Strengthening;Both;10 reps;Seated (with chair in reclined position) Hip ABduction/ADduction: AAROM;Strengthening;10 reps;Seated;Left (with reclined position) Long Arc Quad: AAROM;Strengthening;10 reps;Seated;Left Other Exercises Other Exercises: cues for breathing techniques during session. blood pressure significantly limited by blood pressure. after return to bed, blood pressure was 92/54. alerted Dr Leslye Peer and the nurse    General Comments        Pertinent Vitals/Pain Pain Assessment: Faces Pain Score: 8  Faces Pain Scale: Hurts even more Pain Location: L hip Pain Descriptors / Indicators: Aching;Sore Pain Intervention(s): Limited activity within patient's tolerance    Home Living Family/patient expects to be discharged to:: Private residence Living Arrangements: Spouse/significant other Available Help at Discharge: Family;Available 24 hours/day Type of Home: House Home Access: Stairs to enter Entrance Stairs-Rails: Right;Left;Can reach both Home Layout: One level Home Equipment: Environmental consultant - 2 wheels;Cane - single point;Bedside commode;Shower seat - built in      Prior Function Level of Independence: Independent with assistive device(s)      Comments: Mod I ambulation with a RW or SPC but more often with the RW; was able to manage ADLs   PT Goals (current goals can now be found in the care plan section) Acute Rehab PT Goals Patient Stated Goal: to get back to bed, pain management PT Goal Formulation: With patient Time For Goal Achievement: 08/18/21 Potential to Achieve Goals: Fair Progress towards PT goals: Not progressing toward goals - comment (limited by medical complications)    Frequency    BID      PT Plan Current plan remains appropriate    Co-evaluation              AM-PAC PT "6  Clicks" Mobility   Outcome Measure  Help needed turning from your back to your side while in a flat bed without using bedrails?: A Lot Help needed moving from lying on your back to sitting on the side of a flat bed without using bedrails?: A Lot Help needed moving to and from a bed to a chair (including a wheelchair)?: A Lot Help needed standing up from a chair using your arms (e.g., wheelchair or bedside chair)?: A Lot Help needed to walk in hospital room?: Total Help needed climbing 3-5 steps with a railing? : Total 6 Click Score: 10    End of Session Equipment Utilized During Treatment: Oxygen Activity Tolerance: Treatment limited secondary to medical complications (Comment) (blood pressure) Patient left: in bed;with call bell/phone within reach;with SCD's reapplied;with bed alarm set (ice pack re-applied left hip) Nurse Communication: Mobility status PT Visit Diagnosis: Unsteadiness on feet (R26.81);Muscle weakness (generalized) (M62.81);Other abnormalities of gait and mobility (R26.89);Pain;History of falling (Z91.81) Pain - Right/Left: Left Pain - part of body: Hip     Time: [id number] PT Time Calculation (min) (ACUTE ONLY): 38 min  Charges:  $Therapeutic Exercise: 23-37 mins $Therapeutic Activity: 8-22 mins                     Minna Merritts, PT, MPT   Percell Locus 08/06/2021, 12:39  PM

## 2021-08-06 NOTE — Care Management Important Message (Signed)
Important Message  Patient Details  Name: Melody Ochoa MRN: [id number] Date of Birth: 10-03-1941   Medicare Important Message Given:  Yes     Juliann Pulse A Cletus Paris 08/06/2021, 10:40 AM

## 2021-08-06 NOTE — TOC Progression Note (Signed)
Transition of Care (TOC) - Progression Note    Patient Details  Name: Melody Ochoa MRN: [id number] Date of Birth: 05/22/1941  Transition of Care (TOC) CM/SW Contact   J , RN Phone Number: 08/06/2021, 1:35 PM  Clinical Narrative:    Met with the patient and her husband in the room, reviewed the bed choices They chose [facility], I notified Debra at [facility], needs insurance approval, She has had 2 covid vaccines and would like a covid booster prior to DC, notified Physician.        Expected Discharge Plan and Services                                                 Social Determinants of Health (SDOH) Interventions    Readmission Risk Interventions Readmission Risk Prevention Plan 06/15/2019  Post Dischage Appt Complete  Medication Screening Complete  Transportation Screening Complete  Some recent data might be hidden    

## 2021-08-06 NOTE — Anesthesia Postprocedure Evaluation (Signed)
Anesthesia Post Note  Patient: Melody Ochoa  Procedure(s) Performed: TOTAL HIP ARTHROPLASTY (Left: Hip)  Patient location during evaluation: PACU Anesthesia Type: General Level of consciousness: awake and alert Pain management: pain level controlled Vital Signs Assessment: post-procedure vital signs reviewed and stable Respiratory status: spontaneous breathing, nonlabored ventilation, respiratory function stable and patient connected to nasal cannula oxygen Cardiovascular status: blood pressure returned to baseline and stable Postop Assessment: no apparent nausea or vomiting Anesthetic complications: no   No notable events documented.   Last Vitals:  Vitals:   08/05/21 2020 08/06/21 0216  BP: 113/61 (!) 114/59  Pulse: 92 99  Resp: 20 18  Temp: 37.2 C 37.1 C  SpO2: 92% 97%    Last Pain:  Vitals:   08/05/21 2300  TempSrc:   PainSc: 0-No pain                 Molli Barrows

## 2021-08-06 NOTE — Progress Notes (Signed)
Patient ID: Melody Ochoa, female   DOB: [birth year], 80 y.o.   MRN: [id number] [facility] PROGRESS NOTE  Melody Ochoa [name] DOB: [birth year] DOA: 07/30/2021 PCP: Burnard Hawthorne, FNP  HPI/Subjective: Blood pressure dropped down into the 70s while physical therapy was with the patient and she was feeling miserable.  They got her back in the bed.  I held her Norvasc and ordered for fluid bolus and blood transfusion.  No complaints of chest pain or shortness of breath.  Having a lot of left hip pain.  Her eye discomfort is better.  Objective: Vitals:   08/06/21 0902 08/06/21 1320  BP: (!) 121/59 118/67  Pulse: 89 91  Resp:    Temp:    SpO2: 93%     Intake/Output Summary (Last 24 hours) at 08/06/2021 1326 Last data filed at 08/06/2021 1113 Gross per 24 hour  Intake 240 ml  Output 1100 ml  Net -860 ml   Filed Weights   07/30/21 1246  Weight: 81.6 kg    ROS: Review of Systems  Respiratory:  Negative for shortness of breath.   Cardiovascular:  Negative for chest pain.  Gastrointestinal:  Negative for abdominal pain, nausea and vomiting.  Exam: Physical Exam HENT:     Head: Normocephalic.     Mouth/Throat:     Pharynx: No oropharyngeal exudate.  Eyes:     General: Lids are normal.     Conjunctiva/sclera: Conjunctivae normal.     Pupils: Pupils are equal, round, and reactive to light.  Cardiovascular:     Rate and Rhythm: Normal rate and regular rhythm.     Heart sounds: Normal heart sounds, S1 normal and S2 normal.  Pulmonary:     Breath sounds: Examination of the right-lower field reveals decreased breath sounds. Examination of the left-lower field reveals decreased breath sounds. Decreased breath sounds present. No wheezing, rhonchi or rales.  Abdominal:     Palpations: Abdomen is soft.     Tenderness: There is no abdominal tenderness.  Musculoskeletal:     Right lower leg: No swelling.     Left lower leg: No swelling.  Skin:    General: Skin is  warm.     Findings: No rash.  Neurological:     Mental Status: She is alert and oriented to person, place, and time.      Scheduled Meds:  sodium chloride   Intravenous Once   acetaminophen  650 mg Oral Once   aspirin EC  81 mg Oral Daily   atorvastatin  80 mg Oral Daily   buPROPion  300 mg Oral Daily   docusate sodium  100 mg Oral BID   DULoxetine  30 mg Oral Daily   enoxaparin (LOVENOX) injection  40 mg Subcutaneous Q24H   famotidine  20 mg Oral QHS   feeding supplement  237 mL Oral TID BM   fluticasone  2 spray Each Nare Daily   multivitamin with minerals  1 tablet Oral Daily   pregabalin  50 mg Oral Daily   And   pregabalin  100 mg Oral QHS   Assessment/Plan:  Hypotension this morning.  Held Norvasc.  Fluid bolus.  With blood counts dropping down I will give a unit of packed red blood cells.  Benefits and risks of transfusion explained. Postoperative acute blood loss anemia.  Transfuse 1 unit of packed red blood cells with the patient's hypotension. Left eye irritation postoperatively.  This has improved with natural tears. Acute hypoxic respiratory failure.  Patient placed back on 2 L of oxygen with good saturations.  Initially had a pulse ox of 87% on room air. Interstitial lung disease Hyperlipidemia unspecified.  Continue Lipitor GERD on H2 blocker Peripheral neuropathy on Lyrica Weakness.  Physical therapy currently recommending rehab        Code Status:     Code Status Orders  (From admission, onward)           Start     Ordered   07/31/21 0214  Full code  Continuous        07/31/21 0214           Code Status History     Date Active Date Inactive Code Status Order ID Comments User Context   12/09/2020 2055 12/12/2020 1927 Full Code [id number]  Athena Masse, MD ED   09/30/2019 1229 10/01/2019 1855 Full Code [id number]  Epifanio Lesches, MD ED   06/13/2019 1259 06/15/2019 1621 Full Code [id number]  Lang Snow, NP ED   08/10/2017 0128  08/13/2017 1828 Full Code [id number]  Greta Doom, MD Inpatient   11/22/2014 0347 11/25/2014 1750 Full Code [id number]  [name], MD Inpatient   11/18/2014 1859 11/20/2014 1446 Full Code [id number]  Newman Pies, MD Inpatient      Family Communication: Spoke with husband on the phone Disposition Plan: Status is: Inpatient  Dispo: The patient is from: Home              Anticipated d/c is to: Rehab              Patient currently hypotensive this morning with drop in hemoglobin and postoperative day 1.   Difficult to place patient.  No.  Consultants: Orthopedic surgery  Procedures: Left hip replacement  Time spent: 28 minutes  [place]

## 2021-08-06 NOTE — Progress Notes (Signed)
Physical Therapy Treatment Patient Details Name: FATME FILLINGHAM MRN: [id number] DOB: 1941/03/01 Today's Date: 08/06/2021    History of Present Illness Pt is an 80 yo female with onset of fall with injury to R shoulder and L hip with imaging revealing osteopenia and severe, bone-on-bone arthrosis of the left hip.  Pt underwhent elective L THA 08/05/21.   PMH includes: L hip OA, R THA, osteopenia, depression, dyslipidemia, ILD, GERD, diverticulosis, chronic back pain, fibrotic lung disease, L rib fractures, posterior cervical fusion, cerebral cortical infarcts,    PT Comments    Patient reports feeling better than she did this morning but deferred out of bed activity due to fatigue and hip pain. Blood pressure has improved from the morning to 111/56 taken at rest in the bed. Patient was agreeable to in bed exercises which were performed with verbal instruction from therapist. Patient is hopeful she will be feeling much better tomorrow. Continue to recommend SNF placement at discharge. PT will continue to follow.    Follow Up Recommendations  SNF     Equipment Recommendations  None recommended by PT    Recommendations for Other Services       Precautions / Restrictions Precautions Precautions: Posterior Hip;Fall Restrictions Weight Bearing Restrictions: Yes LLE Weight Bearing: Weight bearing as tolerated    Mobility  Bed Mobility               General bed mobility comments: patient declined any out of bed mobility this session due to feeling poorly from the morning events. she is anticipated to get a unit of pack red blood cells this afternoon. patient agreeable to in bed exercises    Transfers Overall transfer level: Needs assistance                 Ambulation/Gait                 Stairs             Wheelchair Mobility    Modified Rankin (Stroke Patients Only)       Balance                                             Cognition Arousal/Alertness: Awake/alert Behavior During Therapy: WFL for tasks assessed/performed Overall Cognitive Status: Within Functional Limits for tasks assessed                                 General Comments: patient awake, alert and following all commands without difficulty      Exercises Total Joint Exercises Ankle Circles/Pumps: AROM;Strengthening;Both;10 reps;Supine Quad Sets: AROM;Strengthening;Both;10 reps;Supine Gluteal Sets: AROM;Strengthening;Both;10 reps;Supine Short Arc Quad: AAROM;Strengthening;Left;10 reps;Supine Hip ABduction/ADduction: AAROM;Strengthening;Left;10 reps;Supine Long Arc Quad: AAROM;Strengthening;10 reps;Seated;Left Other Exercises Other Exercises: verbal cues for exercise technique for strengthening. blood pressure 111/56 mmHg in supine position    General Comments        Pertinent Vitals/Pain Pain Assessment: Faces Faces Pain Scale: Hurts little more Pain Location: L hip Pain Descriptors / Indicators: Aching;Sore Pain Intervention(s): Limited activity within patient's tolerance    Home Living                      Prior Function            PT Goals (current goals can now be  found in the care plan section) Acute Rehab PT Goals Patient Stated Goal: to feel better PT Goal Formulation: With patient Time For Goal Achievement: 08/18/21 Potential to Achieve Goals: Fair Progress towards PT goals: Progressing toward goals    Frequency    BID      PT Plan Current plan remains appropriate    Co-evaluation              AM-PAC PT "6 Clicks" Mobility   Outcome Measure  Help needed turning from your back to your side while in a flat bed without using bedrails?: A Lot Help needed moving from lying on your back to sitting on the side of a flat bed without using bedrails?: A Lot Help needed moving to and from a bed to a chair (including a wheelchair)?: A Lot Help needed standing up from a chair using  your arms (e.g., wheelchair or bedside chair)?: A Lot Help needed to walk in hospital room?: Total Help needed climbing 3-5 steps with a railing? : Total 6 Click Score: 10    End of Session Equipment Utilized During Treatment: Oxygen Activity Tolerance: Treatment limited secondary to medical complications (Comment);Patient limited by fatigue Patient left: in bed;with call bell/phone within reach;with bed alarm set;with family/visitor present;with SCD's reapplied (spouse at the bedside) Nurse Communication: Mobility status PT Visit Diagnosis: Unsteadiness on feet (R26.81);Muscle weakness (generalized) (M62.81);Other abnormalities of gait and mobility (R26.89);Pain;History of falling (Z91.81) Pain - Right/Left: Left Pain - part of body: Hip     Time: 1349-1405 PT Time Calculation (min) (ACUTE ONLY): 16 min  Charges:  $Therapeutic Exercise: 8-22 mins                    Minna Merritts, PT, MPT    Percell Locus 08/06/2021, 2:57 PM

## 2021-08-06 NOTE — TOC Progression Note (Signed)
Transition of Care [facility]) - Progression Note    Patient Details  Name: Melody Ochoa MRN: [id number] Date of Birth: 07/31/1941  Transition of Care [facility]) CM/SW [place], RN Phone Number: 08/06/2021, 2:48 PM  Clinical Narrative:      Insurance auth approved ref number [id number] to go to [facility]      Expected Discharge Plan and Services                                                 Social Determinants of Health (SDOH) Interventions    Readmission Risk Interventions Readmission Risk Prevention Plan 06/15/2019  Post Dischage Appt Complete  Medication Screening Complete  Transportation Screening Complete  Some recent data might be hidden

## 2021-08-06 NOTE — Evaluation (Signed)
Occupational Therapy Evaluation Patient Details Name: Melody Ochoa MRN: [id number] DOB: 1941/09/07 Today's Date: 08/06/2021    History of Present Illness Pt is an 80 yo female with onset of fall with injury to R shoulder and L hip with imaging revealing osteopenia and severe, bone-on-bone arthrosis of the left hip.  Pt underwhent elective L THA 08/05/21.   PMH includes: L hip OA, R THA, osteopenia, depression, dyslipidemia, ILD, GERD, diverticulosis, chronic back pain, fibrotic lung disease, L rib fractures, posterior cervical fusion, cerebral cortical infarcts,   Clinical Impression   Pt s/p L posterior hip replacement after fall at home.  Pt sleeping upon OT arrival but easily awakened and agreeable to OT evaluation.  Pt lethargic, unreliable historian and distracted by pain.  Pt with pain 7-8/10 pain in L hip with activity, 5/10 at rest; nursing notified of pt's request for pain meds.   Pt able to recall only 1 of 3 posterior hip precautions; OT reviewed all precautions with pt who verbalized understanding but further reinforcement needed.  Able to transfer supine to sit with mod A, and sit to stand with mod A from elevated bed, max vc for hand and foot placement; poor safety and body mechanics once standing and pt would not follow command to stand erect, rather pt leaned R forearm into walker in standing and knees continued to bend, 3 person assist to move pt from a 1/2 stand to recliner to prevent fall.  Hip precautions maintained throughout transfer.  Pt lives with spouse but is unsafe to return home without further rehab.  Pt will benefit from continued acute OT to improve ADL transfer safety and instruct in LB ADLs to ensure maintenance of hip precautions.  OT left pt up in chair, slightly reclined, call light and phone within reach.  Ice pack applied to L hip and nursing going to get pain meds.        Follow Up Recommendations  SNF           Recommendations for Other Services        Precautions / Restrictions Precautions Precautions: Posterior Hip;Fall Precaution Booklet Issued: Yes (comment) Restrictions Weight Bearing Restrictions: Yes LLE Weight Bearing: Weight bearing as tolerated      Mobility Bed Mobility Overal bed mobility: Needs Assistance Bed Mobility: Supine to Sit     Supine to sit: Mod assist     General bed mobility comments: Mod A for LLE and trunk control Patient Response:  (inconsistent to follow directions)  Transfers Overall transfer level: Needs assistance Equipment used: Rolling walker (2 wheeled) Transfers: Sit to/from Stand Sit to Stand: Mod assist;From elevated surface         General transfer comment: Max vc for hand and foot placement; poor safety and would not follow command to stand erect, rather pt leaned R forearm into walker in standing and knees continued to bend, 3 person assist to move pt from a 1/2 stand to recliner    Balance Overall balance assessment: Needs assistance;History of Falls Sitting-balance support: Feet supported Sitting balance-Leahy Scale: Fair Sitting balance - Comments: pt leans toward R hip sitting EOB to relieve pressure from L hip Postural control: Right lateral lean Standing balance support: Bilateral upper extremity supported;During functional activity Standing balance-Leahy Scale: Poor Standing balance comment: mod A with RW with hip flexion, knee flexion and R lateral lean onto R forearm on walker  ADL either performed or assessed with clinical judgement   ADL Overall ADL's : Needs assistance/impaired                     Lower Body Dressing: Total assistance   Toilet Transfer: Total assistance           Functional mobility during ADLs: Maximal assistance;+2 for physical assistance General ADL Comments: Sit to stand from elevated bed with mod A and RW; Pt then took 2 small shuffling steps and tried to sit between bed and chair and  required 3 person assist to return to chair safely.     Vision Baseline Vision/History: 1 Wears glasses Ability to See in Adequate Light: 0 Adequate Patient Visual Report: No change from baseline                  Pertinent Vitals/Pain Pain Score: 8  Pain Location: L hip Pain Descriptors / Indicators: Aching;Sore Pain Intervention(s): Limited activity within patient's tolerance;Patient requesting pain meds-RN notified;Ice applied;Repositioned     Hand Dominance Right   Extremity/Trunk Assessment Upper Extremity Assessment Upper Extremity Assessment: Generalized weakness   Lower Extremity Assessment Lower Extremity Assessment: Defer to PT evaluation   Cervical / Trunk Assessment Cervical / Trunk Assessment: Kyphotic   Communication Communication Communication: No difficulties   Cognition Arousal/Alertness: Lethargic Behavior During Therapy: WFL for tasks assessed/performed Overall Cognitive Status: Within Functional Limits for tasks assessed Area of Impairment: Memory;Attention;Awareness;Problem solving;Following commands;Safety/judgement                   Current Attention Level: Selective Memory: Decreased short-term memory   Safety/Judgement: Decreased awareness of safety   Problem Solving: Slow processing;Requires verbal cues                       Home Living Family/patient expects to be discharged to:: Private residence Living Arrangements: Spouse/significant other Available Help at Discharge: Family;Available 24 hours/day Type of Home: House Home Access: Stairs to enter [employer] of Steps: 2 Entrance Stairs-Rails: Right;Left;Can reach both Home Layout: One level     Bathroom Shower/Tub: Occupational psychologist: Standard     Home Equipment: Environmental consultant - 2 wheels;Cane - single point;Bedside commode;Shower seat - built in          Prior Functioning/Environment Level of Independence: Independent with assistive  device(s)        Comments: Mod I ambulation with a RW or SPC but more often with the RW; was able to manage ADLs        OT Problem List: Decreased strength;Decreased knowledge of use of DME or AE;Decreased knowledge of precautions;Decreased activity tolerance;Impaired balance (sitting and/or standing);Decreased safety awareness;Pain      OT Treatment/Interventions: Self-care/ADL training;Therapeutic exercise;Patient/family education;Balance training;Therapeutic activities;Energy conservation;DME and/or AE instruction    OT Goals(Current goals can be found in the care plan section) Acute Rehab OT Goals Patient Stated Goal: To get back to being independent OT Goal Formulation: With patient Time For Goal Achievement: 08/20/21 Potential to Achieve Goals: Good  OT Frequency: Min 2X/week   Barriers to D/C:    Pt is unsafe to return home at current functional level       Co-evaluation              AM-PAC OT "6 Clicks" Daily Activity     Outcome Measure Help from another person eating meals?: None Help from another person taking care of personal grooming?: A Little Help from another  person toileting, which includes using toliet, bedpan, or urinal?: Total Help from another person bathing (including washing, rinsing, drying)?: A Lot Help from another person to put on and taking off regular upper body clothing?: A Little Help from another person to put on and taking off regular lower body clothing?: Total 6 Click Score: 14   End of Session Equipment Utilized During Treatment: Gait belt;Rolling walker;Oxygen Nurse Communication: Mobility status  Activity Tolerance: Patient limited by pain;Patient limited by lethargy Patient left: in chair;with call bell/phone within reach  OT Visit Diagnosis: Unsteadiness on feet (R26.81);Muscle weakness (generalized) (M62.81);History of falling (Z91.81)                Time: [id number] OT Time Calculation (min): 37 min Charges:  OT General  Charges $OT Visit: 1 Visit OT Evaluation $OT Eval Moderate Complexity: 1 Mod OT Treatments $Therapeutic Exercise: 23-37 mins  Melody Speller, MS, OTR/L   Darleene Cleaver 08/06/2021, 9:41 AM

## 2021-08-06 NOTE — Progress Notes (Signed)
Nutrition Follow-up  DOCUMENTATION CODES:  Not applicable  INTERVENTION:  Liberalize diet to regular to encourage increased PO intake Continue Magic cup TID with meals, each supplement provides 290 kcal and 9 grams of protein Continue Ensure Enlive po TID, each supplement provides 350 kcal and 20 grams of protein MVI with minerals daily  NUTRITION DIAGNOSIS:  Inadequate oral intake related to decreased appetite as evidenced by per patient/family report, meal completion < 25%.  GOAL:  Patient will meet greater than or equal to 90% of their needs  MONITOR:  PO intake, Supplement acceptance, Labs, Weight trends, I & O's  REASON FOR ASSESSMENT:  Malnutrition Screening Tool    ASSESSMENT:  80 yo female with a PMH of diverticulosis, HLD, interstitial lung disease, GERD, hx CVA, CKD, and osteoporosis presented to ED with pain after a fall several days PTA.  Pt underwent left total hip arthroplasty 8/31  Pt resting in bed at the time of visit, states she is doing ok today but does endorse a continued poor appetite. States that she didn't eat much for breakfast, but does like the magic cup she has been receiving on her trays. Inquired about ensure supplements, pt said she has not tried one yet. One noted at bedside unopened. Discussed usefulness in obtaining adequate nutrition while appetite is poor. Pt agreeable to trying, prefers chocolate.   Will liberalize diet doe to poor intake and advanced age.  Average Meal Intake: 8/26-9/1: 40% intake x 8 recorded meals (20-50%)  Nutritionally Relevant Medications: Scheduled Meds:  atorvastatin  80 mg Oral Daily   docusate sodium  100 mg Oral BID   famotidine  20 mg Oral QHS   feeding supplement  237 mL Oral TID BM   multivitamin with minerals  1 tablet Oral Daily   PRN Meds: bisacodyl, diphenhydrAMINE, magnesium hydroxide, ondansetron, sodium phosphate  Labs Reviewed: BUN 26, creatinine 1.07  NUTRITION - FOCUSED PHYSICAL  EXAM: Flowsheet Row Most Recent Value  Orbital Region Mild depletion  Upper Arm Region No depletion  Thoracic and Lumbar Region No depletion  Buccal Region Mild depletion  Temple Region Mild depletion  Clavicle Bone Region No depletion  Clavicle and Acromion Bone Region No depletion  Scapular Bone Region No depletion  Dorsal Hand No depletion  Patellar Region No depletion  Anterior Thigh Region No depletion  Posterior Calf Region No depletion  Edema (RD Assessment) Mild  [LLE]  Hair Reviewed  Eyes Reviewed  Mouth Reviewed  Skin Reviewed  Nails Reviewed   Diet Order:   Diet Order             Diet regular Room service appropriate? Yes; Fluid consistency: Thin  Diet effective now                   EDUCATION NEEDS:  Education needs have been addressed  Skin:  Skin Assessment: Skin Integrity Issues: Skin Integrity Issues:: Incisions Incisions: left hip  Last BM:  9/1  Height:  Ht Readings from Last 1 Encounters:  07/30/21 '5\' 4"'$  (1.626 m)   Weight:  Wt Readings from Last 1 Encounters:  07/30/21 81.6 kg    Ideal Body Weight:  54.5 kg  BMI:  Body mass index is 30.9 kg/m.  Estimated Nutritional Needs:  Kcal:  1650-1850 Protein:  80-95 grams Fluid:  >1.65 L   Ranell Patrick, RD, LDN Clinical Dietitian Pager on [place]

## 2021-08-07 ENCOUNTER — Inpatient Hospital Stay: Payer: Medicare Other

## 2021-08-07 DIAGNOSIS — R410 Disorientation, unspecified: Secondary | ICD-10-CM

## 2021-08-07 LAB — BASIC METABOLIC PANEL
Anion gap: 4 — ABNORMAL LOW (ref 5–15)
BUN: 19 mg/dL (ref 8–23)
CO2: 28 mmol/L (ref 22–32)
Calcium: 8 mg/dL — ABNORMAL LOW (ref 8.9–10.3)
Chloride: 103 mmol/L (ref 98–111)
Creatinine, Ser: 0.89 mg/dL (ref 0.44–1.00)
GFR, Estimated: >60 mL/min (ref >60)
Glucose, Bld: 104 mg/dL — ABNORMAL HIGH (ref 70–99)
Potassium: 4 mmol/L (ref 3.5–5.1)
Sodium: 135 mmol/L (ref 135–145)

## 2021-08-07 LAB — TYPE AND SCREEN
ABO/RH(D): O POS
Antibody Screen: NEGATIVE
Unit division: 0

## 2021-08-07 LAB — CBC
HCT: 28.4 % — ABNORMAL LOW (ref 36.0–46.0)
Hemoglobin: 9.3 g/dL — ABNORMAL LOW (ref 12.0–15.0)
MCH: 28.7 pg (ref 26.0–34.0)
MCHC: 32.7 g/dL (ref 30.0–36.0)
MCV: 87.7 fL (ref 80.0–100.0)
Platelets: 235 10*3/uL (ref 150–400)
RBC: 3.24 MIL/uL — ABNORMAL LOW (ref 3.87–5.11)
RDW: 15.6 % — ABNORMAL HIGH (ref 11.5–15.5)
WBC: 9.7 10*3/uL (ref 4.0–10.5)
nRBC: 0 % (ref 0.0–0.2)

## 2021-08-07 LAB — BPAM RBC
Blood Product Expiration Date: 202210012359
ISSUE DATE / TIME: 202209011427
Unit Type and Rh: 5100

## 2021-08-07 MED ORDER — ENOXAPARIN SODIUM 40 MG/0.4ML IJ SOSY: 40.0000 mg | PREFILLED_SYRINGE | INTRAMUSCULAR | 0 refills | Status: DC

## 2021-08-07 MED ORDER — OXYCODONE HCL 5 MG PO TABS
5.0000 mg | ORAL_TABLET | Freq: Four times a day (QID) | ORAL | Status: DC | PRN
Start: 2021-08-07 — End: 2021-08-11

## 2021-08-07 MED ORDER — ACETAMINOPHEN 500 MG PO TABS: 500.0000 mg | ORAL_TABLET | Freq: Four times a day (QID) | ORAL | 0 refills | Status: DC | PRN

## 2021-08-07 MED ORDER — OXYCODONE HCL 5 MG PO TABS: 2.5000 mg | ORAL_TABLET | ORAL | 0 refills | Status: DC | PRN

## 2021-08-07 MED ORDER — DOCUSATE SODIUM 100 MG PO CAPS: 100.0000 mg | ORAL_CAPSULE | Freq: Two times a day (BID) | ORAL | 0 refills | Status: DC

## 2021-08-07 MED ORDER — CLOPIDOGREL BISULFATE 75 MG PO TABS
75.0000 mg | ORAL_TABLET | Freq: Once | ORAL | Status: AC
  Administered 2021-08-07: 75 mg via ORAL
  Filled 2021-08-07: qty 1

## 2021-08-07 MED ORDER — ENSURE ENLIVE PO LIQD: 237.0000 mL | Freq: Three times a day (TID) | ORAL | 0 refills | Status: AC

## 2021-08-07 NOTE — Plan of Care (Signed)
Patient alert and confused during shift, denies pain with assess. No adverse events. Vitals stable, no respiratory distress on 2 liters nasal canula. Dressing to left hip remain clean, dry and intact.  Problem: Education: Goal: Knowledge of General Education information will improve Description: Including pain rating scale, medication(s)/side effects and non-pharmacologic comfort measures Outcome: Progressing   Problem: Health Behavior/Discharge Planning: Goal: Ability to manage health-related needs will improve Outcome: Progressing   Problem: Clinical Measurements: Goal: Ability to maintain clinical measurements within normal limits will improve Outcome: Progressing Goal: Will remain free from infection Outcome: Progressing Goal: Diagnostic test results will improve Outcome: Progressing Goal: Respiratory complications will improve Outcome: Progressing Goal: Cardiovascular complication will be avoided Outcome: Progressing   Problem: Activity: Goal: Risk for activity intolerance will decrease Outcome: Progressing   Problem: Activity: Goal: Risk for activity intolerance will decrease Outcome: Progressing   Problem: Elimination: Goal: Will not experience complications related to bowel motility Outcome: Progressing Goal: Will not experience complications related to urinary retention Outcome: Progressing   Problem: Pain Managment: Goal: General experience of comfort will improve Outcome: Progressing

## 2021-08-07 NOTE — Progress Notes (Signed)
Subjective: 2 Days Post-Op Procedure(s) (LRB): TOTAL HIP ARTHROPLASTY (Left) Patient reports pain as mild in the left hip this AM.  States that she is feeling better today compared to yesterday. Some confusion this morning but she is able to be re-directed.  She knows that she is in the hospital, oriented to time. Plan is for discharge to SNF when medically stable. Negative for chest pain and shortness of breath Fever: no Gastrointestinal:NEgative for nausea and vomiting Patient has had a BM.  Objective: Vital signs in last 24 hours: Temp:  [97.8 F (36.6 C)-98.9 F (37.2 C)] 98.2 F (36.8 C) (09/02 0515) Pulse Rate:  [59-91] 79 (09/02 0515) Resp:  [18] 18 (09/02 0515) BP: (97-132)/(45-86) 121/82 (09/02 0515) SpO2:  [93 %-100 %] 96 % (09/01 2130) Weight:  [82.4 kg] 82.4 kg (09/01 1411)  Intake/Output from previous day:  Intake/Output Summary (Last 24 hours) at 08/07/2021 0736 Last data filed at 08/07/2021 0517 Gross per 24 hour  Intake 490.67 ml  Output 300 ml  Net 190.67 ml    Intake/Output this shift: No intake/output data recorded.  Labs: Recent Labs    08/05/21 0405 08/06/21 0450 08/07/21 0403  HGB 9.5* 7.9* 9.3*   Recent Labs    08/06/21 0450 08/07/21 0403  WBC 8.6 9.7  RBC 2.83* 3.24*  HCT 25.1* 28.4*  PLT 235 235   Recent Labs    08/06/21 0450 08/07/21 0403  NA 136 135  K 4.1 4.0  CL 103 103  CO2 26 28  BUN 26* 19  CREATININE 1.07* 0.89  GLUCOSE 94 104*  CALCIUM 8.2* 8.0*   No results for input(s): LABPT, INR in the last 72 hours.   EXAM General - Patient is Alert and Appropriate Extremity - ABD soft Sensation intact distally Dorsiflexion/Plantar flexion intact Incision: scant drainage No cellulitis present Compartment soft Left thigh soft to palpation, no significant swelling is noted. Dressing/Incision - blood tinged drainage with mild ecchymosis. Motor Function - intact, moving foot and toes well on exam.  Abdomen soft with normal  bowel sounds.  Past Medical History:  Diagnosis Date   Arthritis    Atheromatous plaque 10/18/2017   Back pain    Cancer ([place])    skin   Depression    Diverticulosis    Dizziness    patient had episode of dizziness when came in room. hx past no dx   GERD (gastroesophageal reflux disease)    Heart murmur    Hyperlipidemia 10/18/2017   PONV (postoperative nausea and vomiting)    yrs ago   Squamous cell carcinoma of skin 11/16/2016   L chest parasternal   Squamous cell carcinoma of skin 02/05/2020   L cheek inf to lat zygoma   Stroke (H[facility])     Assessment/Plan: 2 Days Post-Op Procedure(s) (LRB): TOTAL HIP ARTHROPLASTY (Left) Active Problems:   Status post total hip replacement, left   Acute hip pain, left   Irritation of left eye   Acute respiratory failure with hypoxia (HCC)   Interstitial lung disease (HCC)   Gastroesophageal reflux disease without esophagitis   Hypotension   Acute blood loss anemia  Estimated body mass index is 31.18 kg/m as calculated from the following:   Height as of this encounter: '5\' 4"'$  (1.626 m).   Weight as of this encounter: 82.4 kg. Advance diet Up with therapy  Labs and vitals reviewed.  Hg 9.3 this AM s/p transfusion yesterday. Patient with some confusion this AM however she is able to be  re-directed.  Attempt to limit narcotics for pain. Up with therapy today, currently recommending SNF. Patient has had a BM.  Upon discharge, continue Lovenox '40mg'$  daily for 14 days. Follow-up with [facility] in 10-14 days for staple removal.  DVT Prophylaxis - Lovenox, Foot Pumps, and TED hose Weight-Bearing as tolerated to left leg  J. Cameron Proud, PA-C [facility] 08/07/2021, 7:36 AM

## 2021-08-07 NOTE — Progress Notes (Signed)
Patient ID: Melody Ochoa, female   DOB: 1941/09/16, 80 y.o.   MRN: [id number] [facility] PROGRESS NOTE  Melody Ochoa [id number] DOB: February 18, 1941 DOA: 07/30/2021 PCP: Burnard Hawthorne, FNP  HPI/Subjective: Patient answering questions this morning.  Was hypoxic when physical therapy came into the room at 83%.  She was placed back on oxygen and recovered quickly.  She was a little more confused this morning than yesterday.  Objective: Vitals:   08/07/21 0515 08/07/21 0801  BP: 121/82 (!) 115/55  Pulse: 79 91  Resp: 18 17  Temp: 98.2 F (36.8 C) 99.5 F (37.5 C)  SpO2:  97%    Intake/Output Summary (Last 24 hours) at 08/07/2021 1402 Last data filed at 08/07/2021 1057 Gross per 24 hour  Intake 610.67 ml  Output 100 ml  Net 510.67 ml   Filed Weights   07/30/21 1246 08/06/21 1411  Weight: 81.6 kg 82.4 kg    ROS: Review of Systems  Respiratory:  Negative for shortness of breath.   Cardiovascular:  Negative for chest pain.  Gastrointestinal:  Negative for abdominal pain, nausea and vomiting.  Musculoskeletal:  Positive for joint pain.  Exam: Physical Exam HENT:     Head: Normocephalic.     Mouth/Throat:     Pharynx: No oropharyngeal exudate.  Eyes:     General: Lids are normal.     Conjunctiva/sclera: Conjunctivae normal.     Pupils: Pupils are equal, round, and reactive to light.  Cardiovascular:     Rate and Rhythm: Normal rate and regular rhythm.     Heart sounds: Normal heart sounds, S1 normal and S2 normal.  Pulmonary:     Breath sounds: Normal breath sounds. No decreased breath sounds, wheezing, rhonchi or rales.  Abdominal:     Palpations: Abdomen is soft.     Tenderness: There is no abdominal tenderness.  Musculoskeletal:     Right lower leg: No swelling.     Left lower leg: No swelling.  Skin:    General: Skin is warm.     Findings: No rash.  Neurological:     Mental Status: She is alert. She is confused.      Scheduled Meds:  aspirin  EC  81 mg Oral Daily   atorvastatin  80 mg Oral Daily   buPROPion  300 mg Oral Daily   docusate sodium  100 mg Oral BID   DULoxetine  30 mg Oral Daily   enoxaparin (LOVENOX) injection  40 mg Subcutaneous Q24H   famotidine  20 mg Oral QHS   feeding supplement  237 mL Oral TID BM   fluticasone  2 spray Each Nare Daily   multivitamin with minerals  1 tablet Oral Daily   pregabalin  50 mg Oral Daily   And   pregabalin  100 mg Oral QHS   Assessment/Plan:  Confusion this morning could be in hospital delirium.  We will restart Plavix back, get a CT scan of the head.  Already on aspirin and atorvastatin.  Get rid of IV pain medications.  May also have to stop Lyrica. Hypotension yesterday.  Holding Norvasc.  Responded to blood transfusion. Postoperative acute blood loss anemia.  Responded to transfusion of packed red blood cells with a hemoglobin of 7.9 yesterday up to 9.3 today. Left hip replacement.  Oral pain medications Acute hypoxic respiratory failure.  Pulse ox of 83% on room air at rest in the bed and placed on oxygen.  Qualifies for 24/7 oxygen. Interstitial lung disease  Hyperlipidemia unspecified on Lipitor GERD on H2 blocker Peripheral neuropathy.  May have to hold Lyrica Weakness.  Physical therapy recommending rehab        Code Status:     Code Status Orders  (From admission, onward)           Start     Ordered   07/31/21 0214  Full code  Continuous        07/31/21 0214           Code Status History     Date Active Date Inactive Code Status Order ID Comments User Context   12/09/2020 2055 12/12/2020 1927 Full Code [id number]  Athena Masse, MD ED   09/30/2019 1229 10/01/2019 1855 Full Code [id number]  Epifanio Lesches, MD ED   06/13/2019 1259 06/15/2019 1621 Full Code [id number]  Lang Snow, NP ED   08/10/2017 0128 08/13/2017 1828 Full Code [id number]  Greta Doom, MD Inpatient   11/22/2014 0347 11/25/2014 1750 Full Code [id number]   [name], MD Inpatient   11/18/2014 1859 11/20/2014 1446 Full Code [id number]  Newman Pies, MD Inpatient      Family Communication: Spoke with husband at the bedside Disposition Plan: Status is: Inpatient  Dispo: The patient is from: Home              Anticipated d/c is to: Rehab              Patient currently notified this afternoon having a little more confusion.  We will get a CT scan of the head and restart Plavix.   Difficult to place patient.  No.  Consultants: Orthopedic surgery  Procedures: Left hip replacement  Time spent: 31 minutes  [place]

## 2021-08-07 NOTE — Progress Notes (Signed)
Physical Therapy Treatment Patient Details Name: Melody Ochoa MRN: [id number] DOB: [birth year] Today's Date: 08/07/2021    History of Present Illness Pt is an 80 yo female with onset of fall with injury to R shoulder and L hip with imaging revealing osteopenia and severe, bone-on-bone arthrosis of the left hip.  Pt underwhent elective L THA 08/05/21.   PMH includes: L hip OA, R THA, osteopenia, depression, dyslipidemia, ILD, GERD, diverticulosis, chronic back pain, fibrotic lung disease, L rib fractures, posterior cervical fusion, cerebral cortical infarcts,    PT Comments    Pt asleep upon arrival however, presented with overall lethargy throughout session. Reviewed posterior hip precautions and pt unable to recall any of the 3 precautions. Pt completed supine therex with maximal assistance and verbal cueing for all exercises. Pt demonstrating increased difficulty with speech and completing sentences and overall disoriented. MD notified via secure chat of change in overall presentation. Continue to recommend SNF placement to further progress mobility.    Follow Up Recommendations  SNF     Equipment Recommendations  None recommended by PT    Recommendations for Other Services       Precautions / Restrictions Precautions Precautions: Posterior Hip;Fall Restrictions Weight Bearing Restrictions: Yes LLE Weight Bearing: Weight bearing as tolerated    Mobility  Bed Mobility Overal bed mobility: Needs Assistance      General bed mobility comments: Cues and modA for repositioning trunk in bed for improved posture    Transfers    General transfer comment: Deferred due to lethargy  Ambulation/Gait      General Gait Details: Deferred due to lethargy   Stairs    Wheelchair Mobility    Modified Rankin (Stroke Patients Only)       Balance Unable to assess during this session; Pt only agreeable to bed level exercises          Cognition Arousal/Alertness:  Lethargic Behavior During Therapy: Flat affect Overall Cognitive Status: Impaired/Different from baseline Area of Impairment: Attention;Following commands;Awareness        General Comments: Pt demonstrating difficutly with maintaining alertness and dififculty with forming words and making intelligble sentances      Exercises Total Joint Exercises Ankle Circles/Pumps: 20 reps;Both;Supine (2 x 10) Gluteal Sets: 10 reps;Both;Supine Short Arc Quad: 20 reps;AAROM;Left;Supine Heel Slides: PROM;Left;Supine Hip ABduction/ADduction: Both;10 reps;AAROM;Supine Other Exercises Other Exercises: Static sitting at EOB for improved activity tolerance and sitting balance    General Comments General comments (skin integrity, edema, etc.): Mild edema in R knee      Pertinent Vitals/Pain Pain Assessment: No/denies pain    Home Living      Prior Function            PT Goals (current goals can now be found in the care plan section) Acute Rehab PT Goals Patient Stated Goal: to feel better Progress towards PT goals: Progressing toward goals    Frequency    BID      PT Plan Current plan remains appropriate    Co-evaluation              AM-PAC PT "6 Clicks" Mobility   Outcome Measure  Help needed turning from your back to your side while in a flat bed without using bedrails?: A Lot Help needed moving from lying on your back to sitting on the side of a flat bed without using bedrails?: A Lot Help needed moving to and from a bed to a chair (including a wheelchair)?: A Lot Help needed standing  up from a chair using your arms (e.g., wheelchair or bedside chair)?: A Lot Help needed to walk in hospital room?: Total Help needed climbing 3-5 steps with a railing? : Total 6 Click Score: 10    End of Session Equipment Utilized During Treatment: Gait belt Activity Tolerance: Patient tolerated treatment well Patient left: in bed;with bed alarm set;with call bell/phone within  reach;with SCD's reapplied;Other (comment) (Pillow calf) Nurse Communication: Mobility status PT Visit Diagnosis: Unsteadiness on feet (R26.81);Muscle weakness (generalized) (M62.81);Other abnormalities of gait and mobility (R26.89);Pain;History of falling (Z91.81) Pain - Right/Left: Left Pain - part of body: Hip     Time: 1345-1414 PT Time Calculation (min) (ACUTE ONLY): 29 min  Charges:  $Therapeutic Exercise: 8-22 mins $Therapeutic Activity: 8-22 mins                     Andrey Campanile, SPT    Andrey Campanile 08/07/2021, 2:26 PM

## 2021-08-07 NOTE — Telephone Encounter (Signed)
We have not followed her lipids and have not seen her in over a year, this should be refilled by her PCP.  Thank you!

## 2021-08-07 NOTE — Progress Notes (Signed)
Physical Therapy Treatment Patient Details Name: Melody Ochoa MRN: [id number] DOB: September 30, 1941 Today's Date: 08/07/2021    History of Present Illness Pt is an 80 yo female with onset of fall with injury to R shoulder and L hip with imaging revealing osteopenia and severe, bone-on-bone arthrosis of the left hip.  Pt underwhent elective L THA 08/05/21.   PMH includes: L hip OA, R THA, osteopenia, depression, dyslipidemia, ILD, GERD, diverticulosis, chronic back pain, fibrotic lung disease, L rib fractures, posterior cervical fusion, cerebral cortical infarcts,    PT Comments    Pt was pleasant and motivated to participate during the session and put forth fair effort although pt was very limited functionally.  Pt required physical assistance with all functional tasks and is at a very high risk for falls. Of note pt found in supine with nasal canula off to the side with SpO2 measured on room air at 83-84%, MD in room and aware.  Pt is at a very high risk for falls and would not be safe to return to her prior living situation at this time. Pt will benefit from PT services in a SNF setting upon discharge to safely address deficits listed in patient problem list for decreased caregiver assistance and eventual return to PLOF.     Follow Up Recommendations  SNF     Equipment Recommendations  None recommended by PT    Recommendations for Other Services       Precautions / Restrictions Precautions Precautions: Posterior Hip;Fall Precaution Booklet Issued: Yes (comment) Restrictions Weight Bearing Restrictions: Yes LLE Weight Bearing: Weight bearing as tolerated    Mobility  Bed Mobility Overal bed mobility: Needs Assistance Bed Mobility: Supine to Sit;Sit to Supine     Supine to sit: Mod assist;+2 for physical assistance Sit to supine: Mod assist;+2 for physical assistance   General bed mobility comments: +2 Mod A for BLE and trunk control    Transfers Overall transfer level: Needs  assistance Equipment used: Rolling walker (2 wheeled) Transfers: Sit to/from Stand Sit to Stand: Mod assist;From elevated surface         General transfer comment: Mod verbal cues for sequencing with difficulty coming to full upright position even with +2 assist  Ambulation/Gait Ambulation/Gait assistance: Mod assist Gait Distance (Feet): 1 Feet Assistive device: Rolling walker (2 wheeled) Gait Pattern/deviations: Step-to pattern;Antalgic;Decreased stance time - left Gait velocity: decreased   General Gait Details: Pt only able to take several effortful, antalgic side steps at the EOB with heavy lean on the RW and was unable to make it to the recliner this session   Stairs             Wheelchair Mobility    Modified Rankin (Stroke Patients Only)       Balance Overall balance assessment: Needs assistance;History of Falls Sitting-balance support: Feet supported Sitting balance-Leahy Scale: Poor Sitting balance - Comments: Pt required min to mod A to prevent posterior LOB initially but gradually improved to SBA     Standing balance-Leahy Scale: Poor Standing balance comment: Min to mod A with RW to prevent LOB                            Cognition Arousal/Alertness: Lethargic Behavior During Therapy: WFL for tasks assessed/performed Overall Cognitive Status: Within Functional Limits for tasks assessed  Exercises Other Exercises Other Exercises: Static sitting at EOB for improved activity tolerance and sitting balance    General Comments        Pertinent Vitals/Pain Pain Assessment: Faces Faces Pain Scale: Hurts little more Pain Location: L hip Pain Descriptors / Indicators: Aching;Sore Pain Intervention(s): Premedicated before session;Monitored during session;Repositioned    Home Living                      Prior Function            PT Goals (current goals can now be found  in the care plan section) Progress towards PT goals: PT to reassess next treatment    Frequency    BID      PT Plan Current plan remains appropriate    Co-evaluation              AM-PAC PT "6 Clicks" Mobility   Outcome Measure  Help needed turning from your back to your side while in a flat bed without using bedrails?: A Lot Help needed moving from lying on your back to sitting on the side of a flat bed without using bedrails?: A Lot Help needed moving to and from a bed to a chair (including a wheelchair)?: A Lot Help needed standing up from a chair using your arms (e.g., wheelchair or bedside chair)?: A Lot Help needed to walk in hospital room?: Total Help needed climbing 3-5 steps with a railing? : Total 6 Click Score: 10    End of Session Equipment Utilized During Treatment: Oxygen;Gait belt Activity Tolerance: Patient tolerated treatment well Patient left: in bed;with call bell/phone within reach;with bed alarm set;with family/visitor present;with SCD's reapplied Nurse Communication: Mobility status PT Visit Diagnosis: Unsteadiness on feet (R26.81);Muscle weakness (generalized) (M62.81);Other abnormalities of gait and mobility (R26.89);Pain;History of falling (Z91.81) Pain - Right/Left: Left Pain - part of body: Hip     Time: [id number] PT Time Calculation (min) (ACUTE ONLY): 33 min  Charges:  $Therapeutic Exercise: 8-22 mins $Therapeutic Activity: 8-22 mins                     D. Scott Raif Chachere PT, DPT 08/07/21, 10:35 AM

## 2021-08-07 NOTE — TOC Progression Note (Signed)
Transition of Care [facility]) - Progression Note    Patient Details  Name: MOE ROHLFS MRN: [id number] Date of Birth: June 07, 1941  Transition of Care [facility]) CM/SW [place], RN Phone Number: 08/07/2021, 2:13 PM  Clinical Narrative:     Received insurance approval, [id number] ref number [id number], sent DC summary to [facility]       Expected Discharge Plan and Services                                                 Social Determinants of Health (SDOH) Interventions    Readmission Risk Interventions Readmission Risk Prevention Plan 06/15/2019  Post Dischage Appt Complete  Medication Screening Complete  Transportation Screening Complete  Some recent data might be hidden

## 2021-08-07 NOTE — Plan of Care (Signed)

## 2021-08-07 NOTE — Discharge Instructions (Signed)
Instructions after Total Hip Replacement     J. Dorien Chihuahua, M.D.  Raquel Armel Rabbani, PA-C     Dept. of [facility]  [address], [place]  [address]  Phone: [phone]   Fax: [phone]    DIET: Drink plenty of non-alcoholic fluids. Resume your normal diet. Include foods high in fiber.  ACTIVITY:  You may use crutches or a walker with weight-bearing as tolerated, unless instructed otherwise. You may be weaned off of the walker or crutches by your Physical Therapist.  Do NOT reach below the level of your knees or cross your legs until allowed.    Continue doing gentle exercises. Exercising will reduce the pain and swelling, increase motion, and prevent muscle weakness.   Please continue to use the TED compression stockings for 6 weeks. You may remove the stockings at night, but should reapply them in the morning. Do not drive or operate any equipment until instructed.  WOUND CARE:  Continue to use ice packs periodically to reduce pain and swelling. Keep the incision clean and dry. You may bathe or shower after the staples are removed at the first office visit following surgery.  MEDICATIONS: You may resume your regular medications. Please take the pain medication as prescribed on the medication. Do not take pain medication on an empty stomach. You have been given a prescription for a blood thinner to prevent blood clots. Please take the medication as instructed. Pain medications and iron supplements can cause constipation. Use a stool softener (Senokot or Colace) on a daily basis and a laxative (dulcolax or miralax) as needed. Do not drive or drink alcoholic beverages when taking pain medications.  CALL THE OFFICE FOR: Temperature above 101 degrees Excessive bleeding or drainage on the dressing. Excessive swelling, coldness, or paleness of the toes. Persistent nausea and vomiting.

## 2021-08-07 NOTE — Discharge Summary (Addendum)
[facility] NAME: Melody Ochoa    MR#:  [id number]  DATE OF BIRTH:  Jul 18, 1941  DATE OF ADMISSION:  07/30/2021 ADMITTING PHYSICIAN: Wyvonnia Dusky, MD  DATE OF DISCHARGE: 08/09/2021  PRIMARY CARE PHYSICIAN: Burnard Hawthorne, FNP    ADMISSION DIAGNOSIS:  Weakness [R53.1] Fall [W19.XXXA] Left hip pain [M25.552] Acute hip pain, left [M25.552]  DISCHARGE DIAGNOSIS:  Active Problems:   Status post total hip replacement, left   Acute hip pain, left   Irritation of left eye   Acute respiratory failure with hypoxia (HCC)   Interstitial lung disease (HCC)   Gastroesophageal reflux disease without esophagitis   Hypotension   Acute blood loss anemia   Confusion   Weakness   SECONDARY DIAGNOSIS:   Past Medical History:  Diagnosis Date  . Arthritis   . Atheromatous plaque 10/18/2017  . Back pain   . Cancer ([place])    skin  . Depression   . Diverticulosis   . Dizziness    patient had episode of dizziness when came in room. hx past no dx  . GERD (gastroesophageal reflux disease)   . Heart murmur   . Hyperlipidemia 10/18/2017  . PONV (postoperative nausea and vomiting)    yrs ago  . Squamous cell carcinoma of skin 11/16/2016   L chest parasternal  . Squamous cell carcinoma of skin 02/05/2020   L cheek inf to lat zygoma  . Stroke [facility])     HOSPITAL COURSE:   Acute left hip pain.  Patient had a left hip replacement by Dr. Roland Rack on 08/05/2020.  Patient will be on Lovenox injection for 14 days.  Follow-up with orthopedic surgery as outpatient. Hypotension.  Blood pressure normalized.  Still holding Norvasc at this point.  Blood pressure dropped with standing.  Had fluid bolus yesterday.  Will add midodrine 5 mg po tid.  Can Hopefully discontinue in a week or so or if blood pressure starts rising.  Can Hold if Systolic BP greater than 0000000. Postoperative anemia and acute blood loss anemia.  Patient was transfused 1 unit of  packed red blood cells yesterday and hemoglobin of 7.9.  Hemoglobin 9.3.  After fluid yesterday hemoglobin 8.3 today. Left eye irritation postoperatively improved with natural tears Acute hypoxic respiratory failure.  The patient states that she wears oxygen just at night.  Today did have a pulse ox of 83% on room air so would qualify for 24/7 oxygen.  Prior to discharge from the rehab facility check to see if she is a candidate for 24-hour oxygen with ambulating room air saturation. Interstitial lung disease Hyperlipidemia unspecified on Lipitor GERD on H2 blocker Peripheral neuropathy on Lyrica History of stroke can go back on Plavix as outpatient. Weakness.  Physical therapy recommending rehab Confusion 2 days ago.  Resolved.  CT head negative.  Restarted Plavix. Sore throat.  Cepacol throat lozenge  DISCHARGE CONDITIONS:   Satisfactory  CONSULTS OBTAINED:  Treatment Team:  Earnestine Leys, MD Poggi, Marshall Cork, MD  DRUG ALLERGIES:   Allergies  Allergen Reactions  . Latex Itching  . Aleve [Naproxen Sodium] Other (See Comments)    Gi upset  . Aspirin Other (See Comments)    Stomach pain-aggravates diverticulosis  . Celebrex [Celecoxib] Other (See Comments)    Dizziness   . Effexor [Venlafaxine] Other (See Comments)    Hot flashes   . Gabapentin Other (See Comments)    "Night terrors"  . Hydrocodone Bit-Homatrop Mbr Diarrhea  .  Ibuprofen Other (See Comments)    Gi upset   . Mobic [Meloxicam]     Stomach upset  . Tape Itching and Other (See Comments)    Itchy blisters  . Vioxx [Rofecoxib] Other (See Comments)    Gi upset  . Other Rash and Other (See Comments)    bandaides  . Tramadol Other (See Comments)    hallucinations    DISCHARGE MEDICATIONS:   Allergies as of 08/09/2021       Reactions   Latex Itching   Aleve [naproxen Sodium] Other (See Comments)   Gi upset   Aspirin Other (See Comments)   Stomach pain-aggravates diverticulosis   Celebrex [celecoxib]  Other (See Comments)   Dizziness   Effexor [venlafaxine] Other (See Comments)   Hot flashes   Gabapentin Other (See Comments)   "Night terrors"   Hydrocodone Bit-homatrop Mbr Diarrhea   Ibuprofen Other (See Comments)   Gi upset   Mobic [meloxicam]    Stomach upset   Tape Itching, Other (See Comments)   Itchy blisters   Vioxx [rofecoxib] Other (See Comments)   Gi upset   Other Rash, Other (See Comments)   bandaides   Tramadol Other (See Comments)   hallucinations        Medication List     STOP taking these medications    amLODipine 5 MG tablet Commonly known as: NORVASC   oxyCODONE-acetaminophen 5-325 MG tablet Commonly known as: Percocet       TAKE these medications    acetaminophen 500 MG tablet Commonly known as: TYLENOL Take 1-2 tablets (500-1,000 mg total) by mouth every 6 (six) hours as needed.   albuterol 108 (90 Base) MCG/ACT inhaler Commonly known as: VENTOLIN HFA TAKE 2 PUFFS BY MOUTH EVERY 6 HOURS AS NEEDED FOR WHEEZE OR SHORTNESS OF BREATH   Aspirin Low Dose 81 MG EC tablet Generic drug: aspirin TAKE 1 TABLET (81 MG TOTAL) BY MOUTH DAILY FOR 21 DAYS. SWALLOW WHOLE.   atorvastatin 80 MG tablet Commonly known as: LIPITOR Take 1 tablet (80 mg total) by mouth daily.   buPROPion 300 MG 24 hr tablet Commonly known as: WELLBUTRIN XL TAKE ONE TABLET BY MOUTH EVERY DAY   clopidogrel 75 MG tablet Commonly known as: PLAVIX Take 1 tablet (75 mg total) by mouth daily.   docusate sodium 100 MG capsule Commonly known as: COLACE Take 1 capsule (100 mg total) by mouth 2 (two) times daily.   DULoxetine 30 MG capsule Commonly known as: CYMBALTA TAKE 1 CAPSULE BY MOUTH EVERY DAY   enoxaparin 40 MG/0.4ML injection Commonly known as: LOVENOX Inject 0.4 mLs (40 mg total) into the skin daily.   famotidine 20 MG tablet Commonly known as: PEPCID TAKE ONE TABLET BY MOUTH AT BEDTIME   feeding supplement Liqd Take 237 mLs by mouth 3 (three) times daily  between meals.   fluticasone 50 MCG/ACT nasal spray Commonly known as: FLONASE SPRAY 2 SPRAYS INTO EACH NOSTRIL EVERY DAY   menthol-cetylpyridinium 3 MG lozenge Commonly known as: CEPACOL Take 1 lozenge (3 mg total) by mouth as needed for sore throat.   midodrine 5 MG tablet Commonly known as: PROAMATINE Take 1 tablet (5 mg total) by mouth 3 (three) times daily with meals.   oxyCODONE 5 MG immediate release tablet Commonly known as: Oxy IR/ROXICODONE Take 0.5-1 tablets (2.5-5 mg total) by mouth every 4 (four) hours as needed for moderate pain (pain score 4-6).   pregabalin 50 MG capsule Commonly known as: LYRICA TAKE 1  CAPSULE (50 MG) BY MOUTH EVERY DAY AND TAKE 2 CAPSULES (100 MG) AT BEDTIME. Max daily dose '150mg'$ /day.               Durable Medical Equipment  (From admission, onward)           Start     Ordered   08/05/21 1122  DME Bedside commode  Once       Question:  Patient needs a bedside commode to treat with the following condition  Answer:  Status post total hip replacement, left   08/05/21 1122   08/05/21 1122  DME 3 n 1  Once        08/05/21 1122   08/05/21 1122  DME Walker rolling  Once       Question Answer Comment  Walker: With 5 Inch Wheels   Patient needs a walker to treat with the following condition Status post total hip replacement, left      08/05/21 1122             DISCHARGE INSTRUCTIONS:   Follow-up Dr. Ernst Spell 1 day Follow-up orthopedic surgery  If you experience worsening of your admission symptoms, develop shortness of breath, life threatening emergency, suicidal or homicidal thoughts you must seek medical attention immediately by calling 911 or calling your MD immediately  if symptoms less severe.  You Must read complete instructions/literature along with all the possible adverse reactions/side effects for all the Medicines you take and that have been prescribed to you. Take any new Medicines after you have completely understood  and accept all the possible adverse reactions/side effects.   Please note  You were cared for by a hospitalist during your hospital stay. If you have any questions about your discharge medications or the care you received while you were in the hospital after you are discharged, you can call the unit and asked to speak with the hospitalist on call if the hospitalist that took care of you is not available. Once you are discharged, your primary care physician will handle any further medical issues. Please note that NO REFILLS for any discharge medications will be authorized once you are discharged, as it is imperative that you return to your primary care physician (or establish a relationship with a primary care physician if you do not have one) for your aftercare needs so that they can reassess your need for medications and monitor your lab values.    Today   CHIEF COMPLAINT:   Chief Complaint  Patient presents with  . Fall    HISTORY OF PRESENT ILLNESS:  Melody Ochoa  is a 80 y.o. female came in after a fall   VITAL SIGNS:  Blood pressure (!) 110/54, pulse 76, temperature 98.5 F (36.9 C), temperature source Oral, resp. rate 16, height '5\' 4"'$  (1.626 m), weight 82.4 kg, SpO2 96 %.  I/O:   Intake/Output Summary (Last 24 hours) at 08/09/2021 1109 Last data filed at 08/09/2021 0953 Gross per 24 hour  Intake --  Output 625 ml  Net -625 ml    PHYSICAL EXAMINATION:  GENERAL:  80 y.o.-year-old patient lying in the bed with no acute distress.  EYES: Pupils equal, round, reactive to light and accommodation. No scleral icterus. Extraocular muscles intact.  HEENT: Head atraumatic, normocephalic. Oropharynx and nasopharynx clear.  LUNGS: Normal breath sounds bilaterally, no wheezing, rales,rhonchi or crepitation. No use of accessory muscles of respiration.  CARDIOVASCULAR: S1, S2 normal. No murmurs, rubs, or gallops.  ABDOMEN: Soft, non-tender, non-distended. Bowel  sounds present. No  organomegaly or mass.  EXTREMITIES: No pedal edema.  NEUROLOGIC: Cranial nerves II through XII are intact. PSYCHIATRIC: The patient is alert and answers some questions.  SKIN: No obvious rash, lesion, or ulcer.   DATA REVIEW:   CBC Recent Labs  Lab 08/09/21 0527  WBC 6.3  HGB 8.3*  HCT 24.6*  PLT 226    Chemistries  Recent Labs  Lab 08/08/21 0533  NA 137  K 3.9  CL 104  CO2 28  GLUCOSE 85  BUN 19  CREATININE 0.84  CALCIUM 8.1*     Microbiology Results  Results for orders placed or performed during the hospital encounter of 07/30/21  SARS CORONAVIRUS 2 (TAT 6-24 HRS) Nasopharyngeal Nasopharyngeal Swab     Status: None   Collection Time: 07/31/21  2:05 AM   Specimen: Nasopharyngeal Swab  Result Value Ref Range Status   SARS Coronavirus 2 NEGATIVE NEGATIVE Final    Comment: (NOTE) SARS-CoV-2 target nucleic acids are NOT DETECTED.  The SARS-CoV-2 RNA is generally detectable in upper and lower respiratory specimens during the acute phase of infection. Negative results do not preclude SARS-CoV-2 infection, do not rule out co-infections with other pathogens, and should not be used as the sole basis for treatment or other patient management decisions. Negative results must be combined with clinical observations, patient history, and epidemiological information. The expected result is Negative.  Fact Sheet for Patients: SugarRoll.be  Fact Sheet for Healthcare Providers: https://www.woods-mathews.com/  This test is not yet approved or cleared by the [place] FDA and  has been authorized for detection and/or diagnosis of SARS-CoV-2 by FDA under an Emergency Use Authorization (EUA). This EUA will remain  in effect (meaning this test can be used) for the duration of the COVID-19 declaration under Se ction 564(b)(1) of the Act, 21 U.S.C. section 360bbb-3(b)(1), unless the authorization is terminated or revoked  sooner.  Performed at [facility], [place] [address], [place], [place] [address]   Resp Panel by RT-PCR (Flu A&B, Covid) Nasopharyngeal Swab     Status: None   Collection Time: 08/08/21 10:42 AM   Specimen: Nasopharyngeal Swab; Nasopharyngeal(NP) swabs in vial transport medium  Result Value Ref Range Status   SARS Coronavirus 2 by RT PCR NEGATIVE NEGATIVE Final    Comment: (NOTE) SARS-CoV-2 target nucleic acids are NOT DETECTED.  The SARS-CoV-2 RNA is generally detectable in upper respiratory specimens during the acute phase of infection. The lowest concentration of SARS-CoV-2 viral copies this assay can detect is 138 copies/mL. A negative result does not preclude SARS-Cov-2 infection and should not be used as the sole basis for treatment or other patient management decisions. A negative result may occur with  improper specimen collection/handling, submission of specimen other than nasopharyngeal swab, presence of viral mutation(s) within the areas targeted by this assay, and inadequate number of viral copies(<138 copies/mL). A negative result must be combined with clinical observations, patient history, and epidemiological information. The expected result is Negative.  Fact Sheet for Patients:  EntrepreneurPulse.com.au  Fact Sheet for Healthcare Providers:  IncredibleEmployment.be  This test is no t yet approved or cleared by the [place] FDA and  has been authorized for detection and/or diagnosis of SARS-CoV-2 by FDA under an Emergency Use Authorization (EUA). This EUA will remain  in effect (meaning this test can be used) for the duration of the COVID-19 declaration under Section 564(b)(1) of the Act, 21 U.S.C.section 360bbb-3(b)(1), unless the authorization is terminated  or revoked sooner.  Influenza A by PCR NEGATIVE NEGATIVE Final   Influenza B by PCR NEGATIVE NEGATIVE Final    Comment: (NOTE) The Xpert Xpress  SARS-CoV-2/FLU/RSV plus assay is intended as an aid in the diagnosis of influenza from Nasopharyngeal swab specimens and should not be used as a sole basis for treatment. Nasal washings and aspirates are unacceptable for Xpert Xpress SARS-CoV-2/FLU/RSV testing.  Fact Sheet for Patients: EntrepreneurPulse.com.au  Fact Sheet for Healthcare Providers: IncredibleEmployment.be  This test is not yet approved or cleared by the [place] FDA and has been authorized for detection and/or diagnosis of SARS-CoV-2 by FDA under an Emergency Use Authorization (EUA). This EUA will remain in effect (meaning this test can be used) for the duration of the COVID-19 declaration under Section 564(b)(1) of the Act, 21 U.S.C. section 360bbb-3(b)(1), unless the authorization is terminated or revoked.  Performed at [facility], [address], [place], [place] [address]     Management plans discussed with the patient, family and they are in agreement.  CODE STATUS:     Code Status Orders  (From admission, onward)           Start     Ordered   07/31/21 0214  Full code  Continuous        07/31/21 0214           Code Status History     Date Active Date Inactive Code Status Order ID Comments User Context   12/09/2020 2055 12/12/2020 1927 Full Code [id number]  Athena Masse, MD ED   09/30/2019 1229 10/01/2019 1855 Full Code [id number]  Epifanio Lesches, MD ED   06/13/2019 1259 06/15/2019 1621 Full Code [id number]  Lang Snow, NP ED   08/10/2017 0128 08/13/2017 1828 Full Code [id number]  Greta Doom, MD Inpatient   11/22/2014 0347 11/25/2014 1750 Full Code [id number]  [name], MD Inpatient   11/18/2014 1859 11/20/2014 1446 Full Code [id number]  Newman Pies, MD Inpatient       TOTAL TIME TAKING CARE OF THIS PATIENT: 31 minutes.    Loletha Grayer M.D on 08/09/2021 at 11:09 AM   [facility]  CC: Primary care physician; Burnard Hawthorne, FNP

## 2021-08-07 NOTE — Telephone Encounter (Signed)
This is a [facility] pt 

## 2021-08-08 ENCOUNTER — Inpatient Hospital Stay: Payer: Medicare Other

## 2021-08-08 DIAGNOSIS — R5381 Other malaise: Secondary | ICD-10-CM

## 2021-08-08 DIAGNOSIS — R531 Weakness: Secondary | ICD-10-CM

## 2021-08-08 LAB — BASIC METABOLIC PANEL
Anion gap: 5 (ref 5–15)
BUN: 19 mg/dL (ref 8–23)
CO2: 28 mmol/L (ref 22–32)
Calcium: 8.1 mg/dL — ABNORMAL LOW (ref 8.9–10.3)
Chloride: 104 mmol/L (ref 98–111)
Creatinine, Ser: 0.84 mg/dL (ref 0.44–1.00)
GFR, Estimated: >60 mL/min (ref >60)
Glucose, Bld: 85 mg/dL (ref 70–99)
Potassium: 3.9 mmol/L (ref 3.5–5.1)
Sodium: 137 mmol/L (ref 135–145)

## 2021-08-08 LAB — RESP PANEL BY RT-PCR (FLU A&B, COVID) ARPGX2
Influenza A by PCR: NEGATIVE
Influenza B by PCR: NEGATIVE
SARS Coronavirus 2 by RT PCR: NEGATIVE

## 2021-08-08 MED ORDER — SODIUM CHLORIDE 0.9 % IV BOLUS
250.0000 mL | Freq: Once | INTRAVENOUS | Status: AC
  Administered 2021-08-08: 250 mL via INTRAVENOUS

## 2021-08-08 MED ORDER — MENTHOL 3 MG MT LOZG: 1.0000 | LOZENGE | OROMUCOSAL | 12 refills | Status: DC | PRN

## 2021-08-08 MED ORDER — MENTHOL 3 MG MT LOZG
1.0000 | LOZENGE | OROMUCOSAL | Status: DC | PRN
  Filled 2021-08-08: qty 9

## 2021-08-08 NOTE — Progress Notes (Addendum)
Physical Therapy Treatment Patient Details Name: Melody Ochoa MRN: [id number] DOB: [birth year] Today's Date: 08/08/2021    History of Present Illness Pt is an 80 yo female with onset of fall with injury to R shoulder and L hip with imaging revealing osteopenia and severe, bone-on-bone arthrosis of the left hip.  Pt underwhent elective L THA 08/05/21.   PMH includes: L hip OA, R THA, osteopenia, depression, dyslipidemia, ILD, GERD, diverticulosis, chronic back pain, fibrotic lung disease, L rib fractures, posterior cervical fusion, cerebral cortical infarcts,    PT Comments    Pt seen for PT tx with pt asleep upon PT arrival but easily awakened & agreeable to tx. Pt requires significantly extra time to attempt supine>sit & ultimately requires max assist to come to sitting EOB. Throughout all movement pt appears to weight shift away from L hip, thought to be due to pain. Pt requires max assist for multiple sit>stand transfers throughout session. Pt is able to progress to taking ~3 steps forwards & backwards with mod<>max assist. Pt reports need to void but upon attempt for stand pivot to [facility] pt with increasing lethargy & instructed to return to sitting. Pt c/o feeling "so tired" - difficulty getting dinamap to work but after ~5 minutes BP in LUE 116/64 mmHg (MAP 77), HR 83 bpm. Notified nurse of events of session & vitals. Continue to recommend STR upon d/c to maximize independence with functional mobility & reduce fall risk prior to return home.     Follow Up Recommendations  SNF     Equipment Recommendations   (TBD in next venue)    Recommendations for Other Services       Precautions / Restrictions Precautions Precautions: Posterior Hip;Fall Restrictions Weight Bearing Restrictions: Yes LLE Weight Bearing: Weight bearing as tolerated    Mobility  Bed Mobility Overal bed mobility: Needs Assistance Bed Mobility: Supine to Sit     Supine to sit: Max assist;Mod assist     General  bed mobility comments: extra time to initiate & attempt movement, needs assistance to fully transition LLE off EOB & to upright trunk to sitting    Transfers Overall transfer level: Needs assistance Equipment used: Rolling walker (2 wheeled) Transfers: Sit to/from [facility] Sit to Stand: Max assist Stand pivot transfers: Max assist       General transfer comment: cuing for hand placement, assistance with anterior weight shifting (pt appears to lean to R to decrease weight shifting through L hip), max cuing for proper hand placement to push to standing, assistance to power up  Ambulation/Gait Ambulation/Gait assistance: Mod assist;Max assist Gait Distance (Feet): 3 Feet Assistive device: Rolling walker (2 wheeled) Gait Pattern/deviations: Decreased stance time - left;Decreased weight shift to left;Decreased dorsiflexion - right;Decreased stride length;Decreased dorsiflexion - left;Decreased step length - right;Decreased step length - left Gait velocity: dcreased   General Gait Details: Initially takes steps to R bed>recliner with decreased foot clearance BLE, decreased weight shifting to L. Pt able to progress to taking steps forwards & backwards with RW with decreased weight shift to L, slight improvement in L foot clearance but decreased R foot clearance.   Stairs             Wheelchair Mobility    Modified Rankin (Stroke Patients Only)       Balance Overall balance assessment: Needs assistance Sitting-balance support: Feet supported;Bilateral upper extremity supported Sitting balance-Leahy Scale: Zero Sitting balance - Comments: min/mod assist to achieve upright sitting balance, pt with posterior lean &  decreased weight shifting to L Postural control: Right lateral lean;Posterior lean Standing balance support: During functional activity;Bilateral upper extremity supported Standing balance-Leahy Scale: Zero Standing balance comment: mod<>max assist  2/2 posterior lean                            Cognition Arousal/Alertness: Lethargic Behavior During Therapy: Flat affect Overall Cognitive Status: Impaired/Different from baseline Area of Impairment: Attention;Following commands;Orientation;Memory;Safety/judgement;Awareness                 Orientation Level: Person;Time (initially unaware that she's in the hospital but after PT educates her pt able to recall this at end of session; oriented to year only (thought current month was August))   Memory: Decreased short-term memory Following Commands: Follows one step commands consistently;Follows one step commands with increased time Safety/Judgement: Decreased awareness of safety Awareness: Intellectual Problem Solving: Slow processing;Requires verbal cues        Exercises General Exercises - Lower Extremity Long Arc Quad: AROM;Left;Strengthening;20 reps;Seated Hip Flexion/Marching:  (attempted to perform marching in place with BUE on RW but pt unable to weight shift L<>R to sufficiently clear feet from floor)    General Comments General comments (skin integrity, edema, etc.): Pt unable to recall any posterior hip precautions with PT educating her on 3/3; Pt on room air with SpO2 87-89% so placed on 2L/min with SpO2 >90%      Pertinent Vitals/Pain Pain Assessment: Faces Faces Pain Scale: Hurts even more Pain Location: L hip (at end of session pt c/o "my leg's got a cramp in in") Pain Descriptors / Indicators: Discomfort;Grimacing;Guarding Pain Intervention(s): Limited activity within patient's tolerance;Monitored during session;Repositioned    Home Living                      Prior Function            PT Goals (current goals can now be found in the care plan section) Acute Rehab PT Goals Patient Stated Goal: to feel better PT Goal Formulation: With patient Time For Goal Achievement: 08/18/21 Potential to Achieve Goals: Fair Progress towards PT  goals: Progressing toward goals    Frequency    BID      PT Plan Current plan remains appropriate    Co-evaluation              AM-PAC PT "6 Clicks" Mobility   Outcome Measure  Help needed turning from your back to your side while in a flat bed without using bedrails?: A Lot Help needed moving from lying on your back to sitting on the side of a flat bed without using bedrails?: Total Help needed moving to and from a bed to a chair (including a wheelchair)?: Total Help needed standing up from a chair using your arms (e.g., wheelchair or bedside chair)?: Total Help needed to walk in hospital room?: A Lot Help needed climbing 3-5 steps with a railing? : Total 6 Click Score: 8    End of Session Equipment Utilized During Treatment: Gait belt;Oxygen Activity Tolerance: Patient limited by fatigue Patient left: in chair;with chair alarm set;with call bell/phone within reach Nurse Communication: Mobility status (BP/fatigue/O2) PT Visit Diagnosis: Unsteadiness on feet (R26.81);Muscle weakness (generalized) (M62.81);Other abnormalities of gait and mobility (R26.89);Pain;History of falling (Z91.81) Pain - Right/Left: Left Pain - part of body: Hip     Time: [id number] PT Time Calculation (min) (ACUTE ONLY): 31 min  Charges:  $Therapeutic Activity: 23-37 mins  Lavone Nian, PT, DPT 08/08/21, 1:18 PM    Waunita Schooner 08/08/2021, 1:11 PM

## 2021-08-08 NOTE — Progress Notes (Signed)
Patient ID: Melody Ochoa, female   DOB: 1941/03/12, 80 y.o.   MRN: [id number]  Notified that patient can go to rehab because paperwork was not signed. Canceling discharge.  Dr Leslye Peer

## 2021-08-08 NOTE — Progress Notes (Signed)
Patient ID: Melody Ochoa, female   DOB: 03/07/1941, 80 y.o.   MRN: [id number] [facility] PROGRESS NOTE  Melody Ochoa [name] DOB: 1941/05/26 DOA: 07/30/2021 PCP: Burnard Hawthorne, FNP  HPI/Subjective: Patient seen today sitting in the chair.  She was feeling better.  Has some pain in her hip.  Did have some lower blood pressure while sitting in the chair for a while and blood pressure better well back in bed.  Objective: Vitals:   08/08/21 0700 08/08/21 0754  BP: (!) 119/50 (!) 119/50  Pulse: 78 78  Resp: 16 16  Temp: 98.4 F (36.9 C) 98.4 F (36.9 C)  SpO2: 90% 90%    Intake/Output Summary (Last 24 hours) at 08/08/2021 1107 Last data filed at 08/08/2021 0500 Gross per 24 hour  Intake 240 ml  Output 600 ml  Net -360 ml   Filed Weights   07/30/21 1246 08/06/21 1411  Weight: 81.6 kg 82.4 kg    ROS: Review of Systems  Respiratory:  Negative for shortness of breath.   Cardiovascular:  Negative for chest pain.  Gastrointestinal:  Negative for abdominal pain, nausea and vomiting.  Musculoskeletal:  Positive for joint pain.  Exam: Physical Exam HENT:     Head: Normocephalic.     Mouth/Throat:     Pharynx: No oropharyngeal exudate.  Eyes:     General: Lids are normal.     Conjunctiva/sclera: Conjunctivae normal.  Cardiovascular:     Rate and Rhythm: Normal rate and regular rhythm.     Heart sounds: Normal heart sounds, S1 normal and S2 normal.  Pulmonary:     Breath sounds: Examination of the right-lower field reveals decreased breath sounds and rhonchi. Examination of the left-lower field reveals decreased breath sounds and rhonchi. Decreased breath sounds and rhonchi present. No wheezing or rales.  Abdominal:     Palpations: Abdomen is soft.     Tenderness: There is no abdominal tenderness.  Musculoskeletal:     Right lower leg: No swelling.     Left lower leg: No swelling.  Skin:    General: Skin is warm.     Findings: No rash.  Neurological:      Mental Status: She is alert.     Comments: Answers questions appropriately.  Speech normal      Scheduled Meds:  aspirin EC  81 mg Oral Daily   atorvastatin  80 mg Oral Daily   buPROPion  300 mg Oral Daily   docusate sodium  100 mg Oral BID   DULoxetine  30 mg Oral Daily   enoxaparin (LOVENOX) injection  40 mg Subcutaneous Q24H   famotidine  20 mg Oral QHS   feeding supplement  237 mL Oral TID BM   fluticasone  2 spray Each Nare Daily   multivitamin with minerals  1 tablet Oral Daily   pregabalin  100 mg Oral QHS    Assessment/Plan:  Relative hypotension continue to hold Norvasc even at facility.  If blood pressure rises may be able to restart but would hold at this point.  Fluid bolus.  If blood pressure stable will go out to rehab. Confusion yesterday and is cleared.  CT scan of the head negative Postoperative acute blood loss anemia.  Responded to transfusion with hemoglobin of 9.3 up from 7.9. Left hip replacement on oral pain medications Acute hypoxic respiratory failure.  Patient had a pulse ox of 83% on room air yesterday and placed back on oxygen.  Prior to getting out of  the rehab check pulse ox on room air to see if she qualifies for 24/7 oxygen which I think she will. Interstitial lung disease Hyperlipidemia unspecified.  Continue Lipitor GERD on H2 blocker Peripheral neuropathy on Lyrica Weakness.  Physical therapy recommending rehab        Code Status:     Code Status Orders  (From admission, onward)           Start     Ordered   07/31/21 0214  Full code  Continuous        07/31/21 0214           Code Status History     Date Active Date Inactive Code Status Order ID Comments User Context   12/09/2020 2055 12/12/2020 1927 Full Code [id number]  Athena Masse, MD ED   09/30/2019 1229 10/01/2019 1855 Full Code [id number]  Melody Lesches, MD ED   06/13/2019 1259 06/15/2019 1621 Full Code [id number]  Melody Snow, NP ED   08/10/2017  0128 08/13/2017 1828 Full Code [id number]  Melody Doom, MD Inpatient   11/22/2014 0347 11/25/2014 1750 Full Code [id number]  Melody Patience, MD Inpatient   11/18/2014 1859 11/20/2014 1446 Full Code [id number]  Melody Pies, MD Inpatient      Family Communication.  Husband at bedside Disposition Plan: Status is: Inpatient  Dispo: The patient is from: Home              Anticipated d/c is to: Rehab              Patient currently receiving fluid bolus if blood pressure stable will hopefully go out to rehab   Difficult to place patient.  No.  Time spent: 31 minutes  [place]

## 2021-08-08 NOTE — TOC Transition Note (Addendum)
Transition of Care [facility]) - CM/SW Discharge Note   Patient Details  Name: Melody Ochoa MRN: [id number] Date of Birth: 1941/03/29  Transition of Care [facility]) CM/SW Contact:  Magnus Ivan, LCSW Phone Number: 08/08/2021, 12:50 PM   Clinical Narrative:   Patient to discharge to [facility] today, Room 225B. Confirmed with Neoma Laming at [facility]. CSW updated MD, RN. Attempted call to spouse with update, VM full, asked RN to update spouse if he comes to bedside or calls the unit. Also sent generic text asking spouse to call CSW. Asked RN to call report and MD to submit DC Summary. Medical Necessity Form and Face Sheet placed in Discharge Packet by patient chart.  EMS transport will be arranged when COVID results are back and when RN is ready.   2:05- Per RN, waiting for BP to stabilize before arranging EMS.  3:33- Checked with RN, patient is medically ready for EMS now. [facility] for transport. They are aware patient is on 2L o2 right now. Patient is 7th on the list. RN notified.  4:02- Call from [employer] with [facility]. She has been unable to reach the husband either. She reported since patient is not fully oriented, she cannot sign paperwork, husband will need to sign paperwork and they cannot take patient if he does not do this today. CSW attempted calls to all #s listed. No other family in chart. Called [employer] and requested Wellness Check on husband. Updated Neoma Laming. Will have to cancel DC if unable to reach husband. Updated MD and RN.  4:15- Call from [facility] who is at the home with patient's spouse. He reported he will have spouse call Neoma Laming and informed spouse he needs to go to [facility] today as soon as possible. Updated Neoma Laming with [facility]. Updated MD and RN.  Patient's spouse reported the best # for him is [phone].  4:50- Neoma Laming called and said she has still not heard from patient's husband, they will not be able to  take patient today. Will follow up in the morning. DC cancelled.    Final next level of care: Skilled Nursing Facility Barriers to Discharge: Barriers Resolved   Patient Goals and CMS Choice Patient states their goals for this hospitalization and ongoing recovery are:: SNF CMS Medicare.gov Compare Post Acute Care list provided to:: Patient Represenative (must comment) Choice offered to / list presented to : Spouse  Discharge Placement              Patient chooses bed at: [facility] Patient to be transferred to facility by: EMS Name of family member notified: attempted call to spouse Patient and family notified of of transfer: 08/08/21  Discharge Plan and Services                                     Social Determinants of Health (SDOH) Interventions     Readmission Risk Interventions Readmission Risk Prevention Plan 06/15/2019  Post Dischage Appt Complete  Medication Screening Complete  Transportation Screening Complete  Some recent data might be hidden

## 2021-08-08 NOTE — Progress Notes (Signed)
Subjective: 3 Days Post-Op Procedure(s) (LRB): TOTAL HIP ARTHROPLASTY (Left) Patient denies any hip pain this morning.  Slight confusion.  She is alert and oriented to person and time but not place.  She thinks she is at home. Plan is for discharge to SNF when medically stable. Negative for chest pain and shortness of breath Fever: no Gastrointestinal:NEgative for nausea and vomiting Patient has had a BM.  Objective: Vital signs in last 24 hours: Temp:  [97.6 F (36.4 C)-99.5 F (37.5 C)] 98.4 F (36.9 C) (09/03 0754) Pulse Rate:  [78-95] 78 (09/03 0754) Resp:  [16-20] 16 (09/03 0754) BP: (106-132)/(49-66) 119/50 (09/03 0754) SpO2:  [64 %-96 %] 90 % (09/03 0754)  Intake/Output from previous day:  Intake/Output Summary (Last 24 hours) at 08/08/2021 0822 Last data filed at 08/08/2021 0500 Gross per 24 hour  Intake 360 ml  Output 700 ml  Net -340 ml    Intake/Output this shift: No intake/output data recorded.  Labs: Recent Labs    08/06/21 0450 08/07/21 0403  HGB 7.9* 9.3*   Recent Labs    08/06/21 0450 08/07/21 0403  WBC 8.6 9.7  RBC 2.83* 3.24*  HCT 25.1* 28.4*  PLT 235 235   Recent Labs    08/07/21 0403 08/08/21 0533  NA 135 137  K 4.0 3.9  CL 103 104  CO2 28 28  BUN 19 19  CREATININE 0.89 0.84  GLUCOSE 104* 85  CALCIUM 8.0* 8.1*   No results for input(s): LABPT, INR in the last 72 hours.   EXAM General - Patient is Alert and Appropriate Extremity - ABD soft Sensation intact distally Dorsiflexion/Plantar flexion intact Incision: scant drainage No cellulitis present Compartment soft Left thigh soft to palpation, no significant swelling is noted. Dressing/Incision - blood tinged drainage with mild ecchymosis. Motor Function - intact, moving foot and toes well on exam.  Abdomen soft with normal bowel sounds.  Past Medical History:  Diagnosis Date   Arthritis    Atheromatous plaque 10/18/2017   Back pain    Cancer ([place])    skin    Depression    Diverticulosis    Dizziness    patient had episode of dizziness when came in room. hx past no dx   GERD (gastroesophageal reflux disease)    Heart murmur    Hyperlipidemia 10/18/2017   PONV (postoperative nausea and vomiting)    yrs ago   Squamous cell carcinoma of skin 11/16/2016   L chest parasternal   Squamous cell carcinoma of skin 02/05/2020   L cheek inf to lat zygoma   Stroke (HCC)     Assessment/Plan: 3 Days Post-Op Procedure(s) (LRB): TOTAL HIP ARTHROPLASTY (Left) Active Problems:   Status post total hip replacement, left   Acute hip pain, left   Irritation of left eye   Acute respiratory failure with hypoxia (HCC)   Interstitial lung disease (HCC)   Gastroesophageal reflux disease without esophagitis   Hypotension   Acute blood loss anemia   Confusion  Estimated body mass index is 31.18 kg/m as calculated from the following:   Height as of this encounter: '5\' 4"'$  (1.626 m).   Weight as of this encounter: 82.4 kg. Advance diet Up with therapy Labs and vital signs are stable  Patient still with some slight confusion.  Continue to limit sedating medications/narcotics  Up with therapy today, currently recommending SNF.  Upon discharge, continue Lovenox '40mg'$  daily for 14 days. Follow-up with [facility] in 10-14 days for staple removal.  DVT Prophylaxis - Lovenox, Foot Pumps, and TED hose Weight-Bearing as tolerated to left leg  T. Rachelle Hora, PA-C [facility] 08/08/2021, 8:22 AM

## 2021-08-08 NOTE — Plan of Care (Signed)

## 2021-08-08 NOTE — TOC Progression Note (Addendum)
Transition of Care [facility]) - Progression Note    Patient Details  Name: Melody Ochoa MRN: [id number] Date of Birth: [birth year]  Transition of Care [facility]) CM/SW [place], LCSW Phone Number: 08/08/2021, 9:13 AM  Clinical Narrative:   Patient is supposed to DC to [facility] today. CSW left VM for Neoma Laming in Admissions requesting a return call as soon as possible with room #, # for report, and if patient needs a new COVID test.         Expected Discharge Plan and Services                                                 Social Determinants of Health (SDOH) Interventions    Readmission Risk Interventions Readmission Risk Prevention Plan 06/15/2019  Post Dischage Appt Complete  Medication Screening Complete  Transportation Screening Complete  Some recent data might be hidden

## 2021-08-08 NOTE — Progress Notes (Signed)
Physical Therapy Treatment Patient Details Name: Melody Ochoa MRN: [id number] DOB: 12-Mar-1941 Today's Date: 08/08/2021    History of Present Illness Pt is an 80 yo female with onset of fall with injury to R shoulder and L hip with imaging revealing osteopenia and severe, bone-on-bone arthrosis of the left hip.  Pt underwhent elective L THA 08/05/21.   PMH includes: L hip OA, R THA, osteopenia, depression, dyslipidemia, ILD, GERD, diverticulosis, chronic back pain, fibrotic lung disease, L rib fractures, posterior cervical fusion, cerebral cortical infarcts,    PT Comments    Pt seen for PT tx with pt received in bed. BP sitting in RUE: 83/53 mmHg (MAP 64), HR 88 bpm. Nurse notified & assisted PT with getting pt back to bed, once in bed BP 105/50 mmHg (MAP 68). Pt performs LLE strengthening exercises with instructional cuing for technique. Will continue to follow pt acutely to progress mobility as able.    Follow Up Recommendations  SNF     Equipment Recommendations   (TBD in next venue)    Recommendations for Other Services       Precautions / Restrictions Precautions Precautions: Posterior Hip;Fall Precaution Booklet Issued: Yes (comment) Restrictions Weight Bearing Restrictions: Yes LLE Weight Bearing: Weight bearing as tolerated    Mobility  Bed Mobility Overal bed mobility: Needs Assistance Bed Mobility: Sit to Supine     Supine to sit: Max assist;Mod assist Sit to supine: Mod assist;+2 for physical assistance   General bed mobility comments: Assistance to lower trunk onto bed & elevate BLE onto bed; pt is able to scoot to [facility] with bed in trendelenburg position with extra time    Transfers Overall transfer level: Needs assistance Equipment used: Rolling walker (2 wheeled) Transfers: Sit to/from [facility] Sit to Stand: Max assist;+2 physical assistance;+2 safety/equipment Stand pivot transfers: Max assist;+2 physical assistance;+2  safety/equipment       General transfer comment: Ongoing cuing for safe hand placement, assistance to power up  Ambulation/Gait Ambulation/Gait assistance: Mod assist;+2 safety/equipment;+2 physical assistance Gait Distance (Feet): 2 Feet Assistive device: Rolling walker (2 wheeled) Gait Pattern/deviations: Decreased stance time - left;Decreased weight shift to left;Decreased dorsiflexion - right;Decreased stride length;Decreased dorsiflexion - left;Decreased step length - right;Decreased step length - left Gait velocity: decreased   General Gait Details: Steps chair>bed with decreased foot clearance, decreased weight shifting L; cuing for increased step length LLE   Stairs             Wheelchair Mobility    Modified Rankin (Stroke Patients Only)       Balance Overall balance assessment: Needs assistance Sitting-balance support: Feet supported;Bilateral upper extremity supported Sitting balance-Leahy Scale: Zero Sitting balance - Comments: min/mod assist to achieve upright sitting balance, pt with posterior lean & decreased weight shifting to L Postural control: Right lateral lean;Posterior lean Standing balance support: Bilateral upper extremity supported;During functional activity Standing balance-Leahy Scale: Zero Standing balance comment: mod<>max assist with BUE on RW                            Cognition Arousal/Alertness: Lethargic Behavior During Therapy: Flat affect Overall Cognitive Status: Impaired/Different from baseline Area of Impairment: Attention;Following commands;Orientation;Memory;Safety/judgement;Awareness                 Orientation Level: Person;Time (initially unaware that she's in the hospital but after PT educates her pt able to recall this at end of session; oriented to year only (thought current  month was August))   Memory: Decreased short-term memory Following Commands: Follows one step commands consistently;Follows one  step commands with increased time Safety/Judgement: Decreased awareness of safety Awareness: Intellectual Problem Solving: Slow processing;Requires verbal cues        Exercises General Exercises - Lower Extremity Ankle Circles/Pumps: AROM;Left;10 reps Quad Sets: AROM;Strengthening;Left;10 reps Long Arc Quad: AROM;Strengthening;Seated;10 reps;Left Heel Slides: AAROM;Strengthening;Left;10 reps;Supine Hip ABduction/ADduction: AAROM;Strengthening;Left;10 reps;Supine (hip abduction slides) Straight Leg Raises: AAROM;Strengthening;Left;10 reps;Supine Hip Flexion/Marching:  (attempted to perform marching in place with BUE on RW but pt unable to weight shift L<>R to sufficiently clear feet from floor)    General Comments General comments (skin integrity, edema, etc.): Pt able to recall 1-2/3 posterior hip precautions. Appears more alert during this session compared to earlier one.      Pertinent Vitals/Pain Pain Assessment: Faces Faces Pain Scale: Hurts little more Pain Location: L hip Pain Descriptors / Indicators: Discomfort;Grimacing;Guarding Pain Intervention(s): Limited activity within patient's tolerance;Monitored during session;Repositioned    Home Living                      Prior Function            PT Goals (current goals can now be found in the care plan section) Acute Rehab PT Goals Patient Stated Goal: to feel better PT Goal Formulation: With patient Time For Goal Achievement: 08/18/21 Potential to Achieve Goals: Fair Progress towards PT goals: Progressing toward goals    Frequency    BID      PT Plan Current plan remains appropriate    Co-evaluation              AM-PAC PT "6 Clicks" Mobility   Outcome Measure  Help needed turning from your back to your side while in a flat bed without using bedrails?: A Lot Help needed moving from lying on your back to sitting on the side of a flat bed without using bedrails?: Total Help needed moving to  and from a bed to a chair (including a wheelchair)?: Total Help needed standing up from a chair using your arms (e.g., wheelchair or bedside chair)?: Total Help needed to walk in hospital room?: A Lot Help needed climbing 3-5 steps with a railing? : Total 6 Click Score: 8    End of Session Equipment Utilized During Treatment: Gait belt;Oxygen Activity Tolerance: Patient limited by fatigue;Patient limited by pain Patient left: in bed;with call bell/phone within reach;with bed alarm set;with SCD's reapplied Nurse Communication:  (BP) PT Visit Diagnosis: Unsteadiness on feet (R26.81);Muscle weakness (generalized) (M62.81);Other abnormalities of gait and mobility (R26.89);Pain;History of falling (Z91.81) Pain - Right/Left: Left Pain - part of body: Hip     Time: [id number] PT Time Calculation (min) (ACUTE ONLY): 23 min  Charges:  $Therapeutic Exercise: 8-22 mins $Therapeutic Activity: 8-22 mins                     Lavone Nian, PT, DPT 08/08/21, 1:21 PM    Waunita Schooner 08/08/2021, 1:20 PM

## 2021-08-09 DIAGNOSIS — I951 Orthostatic hypotension: Secondary | ICD-10-CM

## 2021-08-09 LAB — CBC
HCT: 24.6 % — ABNORMAL LOW (ref 36.0–46.0)
Hemoglobin: 8.3 g/dL — ABNORMAL LOW (ref 12.0–15.0)
MCH: 29.9 pg (ref 26.0–34.0)
MCHC: 33.7 g/dL (ref 30.0–36.0)
MCV: 88.5 fL (ref 80.0–100.0)
Platelets: 226 10*3/uL (ref 150–400)
RBC: 2.78 MIL/uL — ABNORMAL LOW (ref 3.87–5.11)
RDW: 16.1 % — ABNORMAL HIGH (ref 11.5–15.5)
WBC: 6.3 10*3/uL (ref 4.0–10.5)
nRBC: 0 % (ref 0.0–0.2)

## 2021-08-09 MED ORDER — MIDODRINE HCL 5 MG PO TABS
5.0000 mg | ORAL_TABLET | Freq: Three times a day (TID) | ORAL | Status: DC
  Administered 2021-08-09 – 2021-08-10 (×4): 5 mg via ORAL
  Filled 2021-08-09 (×4): qty 1

## 2021-08-09 MED ORDER — MIDODRINE HCL 5 MG PO TABS: 5.0000 mg | ORAL_TABLET | Freq: Three times a day (TID) | ORAL | 0 refills | Status: DC

## 2021-08-09 NOTE — Progress Notes (Signed)
Patient ID: Melody Ochoa, female   DOB: [birth year], 79 y.o.   MRN: [id number]  Since midodrine started earlier today the nursing home unable to do a new medication over the weekend until Tuesday.  Likely out to rehab on Tuesday.  Dr Loletha Grayer

## 2021-08-09 NOTE — TOC Progression Note (Addendum)
Transition of Care [facility]) - Progression Note    Patient Details  Name: IMONA BROMBERG MRN: [id number] Date of Birth: 07/03/1941  Transition of Care [facility]) CM/SW [place], LCSW Phone Number: 08/09/2021, 12:16 PM  Clinical Narrative:   CSW spoke with Neoma Laming with [facility] who confirmed patient's husband came to sign paperwork yesterday so patient can come today.   Neoma Laming expressed concerns about husband's confusion and being home alone. Spoke to MD and called APS with details and for wellness check on husband.   12:15- Patient is being started on Midodrine. Reached out to [employer] with [facility], waiting for reply. [place] main #, spoke to Nurse Vy who said patient cannot come until Tuesday because they will have to order this new medication for patient. Their pharmacy is closed Monday. Waiting for Neoma Laming to confirm. Patient will need new insurance auth if she does not DC to SNF on Tuesday. Josem Kaufmann is good through Tuesday 9/6.  1:35- VM from [place], on call SW with [facility] APS. Attempted return call, left a VM.  2:13- Spoke to [employer] with [facility] APS, report made to request APS check on patient's husband.   2:30- Neoma Laming confirmed [facility] cannot take patient until Tuesday due to new medicine.    Barriers to Discharge: Barriers Resolved  Expected Discharge Plan and Services           Expected Discharge Date: 08/09/21                                     Social Determinants of Health (SDOH) Interventions    Readmission Risk Interventions Readmission Risk Prevention Plan 06/15/2019  Post Dischage Appt Complete  Medication Screening Complete  Transportation Screening Complete  Some recent data might be hidden

## 2021-08-09 NOTE — Progress Notes (Signed)
Physical Therapy Treatment Patient Details Name: Melody Ochoa MRN: [id number] DOB: Apr 10, 1941 Today's Date: 08/09/2021    History of Present Illness Pt is an 80 yo female with onset of fall with injury to R shoulder and L hip with imaging revealing osteopenia and severe, bone-on-bone arthrosis of the left hip.  Pt underwhent elective L THA 08/05/21.   PMH includes: L hip OA, R THA, osteopenia, depression, dyslipidemia, ILD, GERD, diverticulosis, chronic back pain, fibrotic lung disease, L rib fractures, posterior cervical fusion, cerebral cortical infarcts,    PT Comments    Pt seen for PT session with pt appearing disheveled in bed with nasal cannula out of nose. PT placed nasal cannula in pt's nose & attempted to reorient pt to situation, location. Pt requires max assist for bed mobility & stand pivot transfer to recliner on this date. Pt continues to demonstrate decreased willingness to weight shift to L hip in sitting & standing which impairs her balance & safety with mobility. Pt noted to be orthostatic after transferring to recliner but pt denying symptoms. Will continue to follow pt acutely to progress mobility as able.  BP checked in RUE: Supine: 108/57 mmHg (MAP 70) Sitting: 112/80 mmHg (MAP 92) After transferring to recliner & sitting: 89/53 mmHg (MAP 65) Rechecked 2 minutes later, sitting in recliner with BLE elevated & slightly reclined trunk: 140/66 mmHg (MAP 86) Rechecked: 120/69 mmHg (MAP 85) Checked 5 minutes later at end of session: 114/76 mmHg (MAP 87)  RN & MD notified of low BP after transferring to recliner. R    Follow Up Recommendations  SNF     Equipment Recommendations  None recommended by PT    Recommendations for Other Services       Precautions / Restrictions Precautions Precautions: Posterior Hip;Fall Restrictions Weight Bearing Restrictions: Yes LLE Weight Bearing: Weight bearing as tolerated    Mobility  Bed Mobility Overal bed mobility:  Needs Assistance Bed Mobility: Supine to Sit     Supine to sit: Max assist     General bed mobility comments: assistance to transfer BLE to EOB & to upright trunk    Transfers Overall transfer level: Needs assistance Equipment used: Rolling walker (2 wheeled) Transfers: Sit to/from [facility] Sit to Stand: Max assist Stand pivot transfers: Max assist       General transfer comment: Max cuing to push to stand with RUE & for overall technique  Ambulation/Gait Ambulation/Gait assistance: Max assist Gait Distance (Feet): 2 Feet Assistive device: Rolling walker (2 wheeled) Gait Pattern/deviations: Decreased stance time - left;Decreased weight shift to left;Decreased dorsiflexion - right;Decreased stride length;Decreased dorsiflexion - left;Decreased step length - right;Decreased step length - left Gait velocity: decreased   General Gait Details: Pt takes side "steps" bed>recliner with pt shuffling feet across floor vs stepping. Pt with R lateral lean as she has decreased weight bearing through L hip.   Stairs             Wheelchair Mobility    Modified Rankin (Stroke Patients Only)       Balance Overall balance assessment: Needs assistance Sitting-balance support: Feet supported;Bilateral upper extremity supported Sitting balance-Leahy Scale: Zero Sitting balance - Comments: max assist 2/2 posterior/R lateral lean in sitting & standing, believe pt attempts to unweight L hip 2/2 pain Postural control: Right lateral lean;Posterior lean Standing balance support: Bilateral upper extremity supported;During functional activity Standing balance-Leahy Scale: Zero Standing balance comment: max assist with BUE support on RW  Cognition Arousal/Alertness: Awake/alert Behavior During Therapy: Flat affect Overall Cognitive Status: Impaired/Different from baseline Area of Impairment: Attention;Following  commands;Orientation;Memory;Safety/judgement;Awareness;Problem solving                 Orientation Level: Person   Memory: Decreased short-term memory;Decreased recall of precautions Following Commands: Follows one step commands consistently;Follows one step commands with increased time Safety/Judgement: Decreased awareness of safety Awareness: Intellectual Problem Solving: Slow processing;Requires verbal cues General Comments: Pt initially reports she's at [facility] but in [place], then later asks where [facility] is as if she doesn't know of place in relation to her. Pt only able to recall 1/3 posterior hip precautions - requires max cuing to recall 3/3. Pt with poor ability to follow commands during session. Pt also received in bed with SCD off one leg, O2 out of nose, and bed pan laying beside of her with pt reporting she's getting out of here.      Exercises      General Comments General comments (skin integrity, edema, etc.): Educated pt on need for supplemental O2 as pt with SpO2 87% on room air & PT placing nasal cannula on pt.      Pertinent Vitals/Pain Faces Pain Scale: Hurts little more Pain Location: L hip Pain Descriptors / Indicators: Discomfort;Grimacing;Guarding Pain Intervention(s): Limited activity within patient's tolerance;Monitored during session;Repositioned    Home Living                      Prior Function            PT Goals (current goals can now be found in the care plan section) Acute Rehab PT Goals Patient Stated Goal: to feel better PT Goal Formulation: With patient Time For Goal Achievement: 08/18/21 Potential to Achieve Goals: Fair Progress towards PT goals: Progressing toward goals    Frequency    BID      PT Plan Current plan remains appropriate    Co-evaluation              AM-PAC PT "6 Clicks" Mobility   Outcome Measure  Help needed turning from your back to your side while in a flat bed without using bedrails?: A  Lot Help needed moving from lying on your back to sitting on the side of a flat bed without using bedrails?: Total Help needed moving to and from a bed to a chair (including a wheelchair)?: Total Help needed standing up from a chair using your arms (e.g., wheelchair or bedside chair)?: Total Help needed to walk in hospital room?: Total Help needed climbing 3-5 steps with a railing? : Total 6 Click Score: 7    End of Session Equipment Utilized During Treatment: Gait belt;Oxygen Activity Tolerance: Patient tolerated treatment well;Patient limited by fatigue Patient left: with call bell/phone within reach;in chair;with chair alarm set Nurse Communication: Mobility status (events of session, vitals) PT Visit Diagnosis: Unsteadiness on feet (R26.81);Muscle weakness (generalized) (M62.81);Other abnormalities of gait and mobility (R26.89);Pain;History of falling (Z91.81) Pain - Right/Left: Left Pain - part of body: Hip     Time: 1047-1110 PT Time Calculation (min) (ACUTE ONLY): 23 min  Charges:  $Therapeutic Activity: 23-37 mins                     Lavone Nian, PT, DPT 08/09/21, 3:02 PM    Waunita Schooner 08/09/2021, 2:59 PM

## 2021-08-09 NOTE — Progress Notes (Signed)
Subjective: 4 Days Post-Op Procedure(s) (LRB): TOTAL HIP ARTHROPLASTY (Left) Patient denies any hip pain this morning. Much more alert this am. No confusion. Plan is for discharge to SNF when medically stable. Negative for chest pain and shortness of breath Fever: no Gastrointestinal:NEgative for nausea and vomiting Patient has had a BM.  Objective: Vital signs in last 24 hours: Temp:  [98 F (36.7 C)-98.5 F (36.9 C)] 98.5 F (36.9 C) (09/04 0736) Pulse Rate:  [76-91] 76 (09/04 0736) Resp:  [16-18] 16 (09/04 0736) BP: (83-124)/(50-63) 110/54 (09/04 0736) SpO2:  [92 %-100 %] 96 % (09/04 0736)  Intake/Output from previous day:  Intake/Output Summary (Last 24 hours) at 08/09/2021 0850 Last data filed at 08/09/2021 0408 Gross per 24 hour  Intake --  Output 125 ml  Net -125 ml    Intake/Output this shift: No intake/output data recorded.  Labs: Recent Labs    08/07/21 0403 08/09/21 0527  HGB 9.3* 8.3*   Recent Labs    08/07/21 0403 08/09/21 0527  WBC 9.7 6.3  RBC 3.24* 2.78*  HCT 28.4* 24.6*  PLT 235 226   Recent Labs    08/07/21 0403 08/08/21 0533  NA 135 137  K 4.0 3.9  CL 103 104  CO2 28 28  BUN 19 19  CREATININE 0.89 0.84  GLUCOSE 104* 85  CALCIUM 8.0* 8.1*   No results for input(s): LABPT, INR in the last 72 hours.   EXAM General - Patient is Alert, Appropriate, and Oriented Extremity - ABD soft Sensation intact distally Dorsiflexion/Plantar flexion intact Incision: scant drainage No cellulitis present Compartment soft Left thigh soft to palpation, no significant swelling is noted. Dressing/Incision - blood tinged drainage with mild ecchymosis. New dressing applied Motor Function - intact, moving foot and toes well on exam.  Abdomen soft with normal bowel sounds.  Past Medical History:  Diagnosis Date   Arthritis    Atheromatous plaque 10/18/2017   Back pain    Cancer ([place])    skin   Depression    Diverticulosis    Dizziness     patient had episode of dizziness when came in room. hx past no dx   GERD (gastroesophageal reflux disease)    Heart murmur    Hyperlipidemia 10/18/2017   PONV (postoperative nausea and vomiting)    yrs ago   Squamous cell carcinoma of skin 11/16/2016   L chest parasternal   Squamous cell carcinoma of skin 02/05/2020   L cheek inf to lat zygoma   Stroke (HCC)     Assessment/Plan: 4 Days Post-Op Procedure(s) (LRB): TOTAL HIP ARTHROPLASTY (Left) Active Problems:   Status post total hip replacement, left   Acute hip pain, left   Irritation of left eye   Acute respiratory failure with hypoxia (HCC)   Interstitial lung disease (HCC)   Gastroesophageal reflux disease without esophagitis   Hypotension   Acute blood loss anemia   Confusion   Weakness  Estimated body mass index is 31.18 kg/m as calculated from the following:   Height as of this encounter: '5\' 4"'$  (1.626 m).   Weight as of this encounter: 82.4 kg. Advance diet Up with therapy Labs and vital signs are stable  Confusion seems to have resolved.   Up with therapy today, currently recommending SNF. DC to SNF today if they are able to accept the patient  Upon discharge, continue Lovenox '40mg'$  daily for 14 days. Follow-up with [facility] in 10-14 days for staple removal.  DVT Prophylaxis - Lovenox,  Foot Pumps, and TED hose Weight-Bearing as tolerated to left leg  T. Rachelle Hora, PA-C [facility] Orthopaedic Surgery 08/09/2021, 8:50 AM

## 2021-08-09 NOTE — Progress Notes (Addendum)
Patient ID: Melody Ochoa, female   DOB: 04/04/1941, 80 y.o.   MRN: [id number] [facility] PROGRESS NOTE  LAVARIA RIEHLE [name] DOB: 05-08-1941 DOA: 07/30/2021 PCP: Burnard Hawthorne, FNP  HPI/Subjective: Patient seen earlier this morning and again late morning.  Late morning she was sitting in the chair.  No lightheadedness or dizziness.  When patient stood up her blood pressure dropped with physical therapy.  Objective: Vitals:   08/09/21 0404 08/09/21 0736  BP: (!) 124/55 (!) 110/54  Pulse: 76 76  Resp: 16 16  Temp: 98 F (36.7 C) 98.5 F (36.9 C)  SpO2: 96% 96%    Intake/Output Summary (Last 24 hours) at 08/09/2021 0755 Last data filed at 08/09/2021 0408 Gross per 24 hour  Intake --  Output 125 ml  Net -125 ml   Filed Weights   07/30/21 1246 08/06/21 1411  Weight: 81.6 kg 82.4 kg    ROS: Review of Systems  Respiratory:  Negative for shortness of breath.   Cardiovascular:  Negative for chest pain.  Gastrointestinal:  Negative for abdominal pain, nausea and vomiting.  Musculoskeletal:  Positive for joint pain.  Exam: Physical Exam HENT:     Head: Normocephalic.     Mouth/Throat:     Pharynx: No oropharyngeal exudate.  Eyes:     General: Lids are normal.     Conjunctiva/sclera: Conjunctivae normal.     Pupils: Pupils are equal, round, and reactive to light.  Cardiovascular:     Rate and Rhythm: Normal rate and regular rhythm.     Heart sounds: Normal heart sounds, S1 normal and S2 normal.  Pulmonary:     Breath sounds: Normal breath sounds. No decreased breath sounds, wheezing, rhonchi or rales.  Abdominal:     Palpations: Abdomen is soft.     Tenderness: There is no abdominal tenderness.  Musculoskeletal:     Right ankle: No swelling.     Left ankle: No swelling.  Skin:    General: Skin is warm.     Findings: No lesion.  Neurological:     Mental Status: She is alert.     Comments: Answers questions appropriately      Scheduled  Meds:  aspirin EC  81 mg Oral Daily   atorvastatin  80 mg Oral Daily   buPROPion  300 mg Oral Daily   docusate sodium  100 mg Oral BID   DULoxetine  30 mg Oral Daily   enoxaparin (LOVENOX) injection  40 mg Subcutaneous Q24H   famotidine  20 mg Oral QHS   feeding supplement  237 mL Oral TID BM   fluticasone  2 spray Each Nare Daily   multivitamin with minerals  1 tablet Oral Daily   pregabalin  100 mg Oral QHS     Assessment/Plan:   Relative hypotension.  Continue to hold Norvasc.  Blood pressure dropped with physical therapy today.  Patient felt okay.  Currently sitting in the recliner and feels okay.  We will start low-dose midodrine.  Hold for systolic blood pressure greater than 160.  Hopefully she will he will not need this medication for a long period Confusion 2 days ago and has improved.  CT scan of the head negative.  Restarted Plavix. Postoperative acute blood loss.  Responded to blood transfusion with a hemoglobin of 7.9.  Came up to 9.3 initially.  I did give fluid boluses and hemoglobin today 8.3. Left hip replacement on oral pain medications.  Physical therapy recommending rehab Acute hypoxic respiratory  failure.  Continue 24/7 oxygen 2 L.  Prior to disposition from rehab Will need ambulatory pulse ox to see if a candidate for oxygen. Interstitial lung disease Hyperlipidemia unspecified.  Continue Lipitor GERD on H2 blocker Peripheral neuropathy on Lyrica Weakness.  Physical therapy recommends rehab.        Code Status:     Code Status Orders  (From admission, onward)           Start     Ordered   07/31/21 0214  Full code  Continuous        07/31/21 0214           Code Status History     Date Active Date Inactive Code Status Order ID Comments User Context   12/09/2020 2055 12/12/2020 1927 Full Code [id number]  Athena Masse, MD ED   09/30/2019 1229 10/01/2019 1855 Full Code [id number]  Epifanio Lesches, MD ED   06/13/2019 1259 06/15/2019 1621 Full  Code [id number]  Lang Snow, NP ED   08/10/2017 0128 08/13/2017 1828 Full Code [id number]  Greta Doom, MD Inpatient   11/22/2014 0347 11/25/2014 1750 Full Code [id number]  [name], MD Inpatient   11/18/2014 1859 11/20/2014 1446 Full Code [id number]  Newman Pies, MD Inpatient      Disposition Plan: Status is: Inpatient  Dispo: The patient is from: Home              Anticipated d/c is to: Rehab              Patient currently medically stable   Difficult to place patient. No  Time spent: 31 minutes  [place]

## 2021-08-09 NOTE — Plan of Care (Signed)
Patient alert with intermittent confusion, denies pain with assess. Dressing to left hip remain clean, dry and intact. Vitals stable, no respiratory distress on 2 liters via nasal canula. Pending snf placement and will need covid swab with negative results prior to discharge. Will continue to monitor.   Problem: Education: Goal: Knowledge of General Education information will improve Description: Including pain rating scale, medication(s)/side effects and non-pharmacologic comfort measures Outcome: Progressing   Problem: Health Behavior/Discharge Planning: Goal: Ability to manage health-related needs will improve Outcome: Progressing   Problem: Clinical Measurements: Goal: Ability to maintain clinical measurements within normal limits will improve Outcome: Progressing Goal: Will remain free from infection Outcome: Progressing Goal: Diagnostic test results will improve Outcome: Progressing Goal: Respiratory complications will improve Outcome: Progressing Goal: Cardiovascular complication will be avoided Outcome: Progressing   Problem: Activity: Goal: Risk for activity intolerance will decrease Outcome: Progressing   Problem: Coping: Goal: Level of anxiety will decrease Outcome: Progressing   Problem: Safety: Goal: Ability to remain free from injury will improve Outcome: Progressing

## 2021-08-10 LAB — CBC
HCT: 26.6 % — ABNORMAL LOW (ref 36.0–46.0)
Hemoglobin: 8.4 g/dL — ABNORMAL LOW (ref 12.0–15.0)
MCH: 28.4 pg (ref 26.0–34.0)
MCHC: 31.6 g/dL (ref 30.0–36.0)
MCV: 89.9 fL (ref 80.0–100.0)
Platelets: 278 10*3/uL (ref 150–400)
RBC: 2.96 MIL/uL — ABNORMAL LOW (ref 3.87–5.11)
RDW: 15.7 % — ABNORMAL HIGH (ref 11.5–15.5)
WBC: 6.6 10*3/uL (ref 4.0–10.5)
nRBC: 0 % (ref 0.0–0.2)

## 2021-08-10 LAB — TYPE AND SCREEN
ABO/RH(D): O POS
Antibody Screen: NEGATIVE

## 2021-08-10 LAB — GLUCOSE, CAPILLARY: Glucose-Capillary: 81 mg/dL (ref 70–99)

## 2021-08-10 MED ORDER — MIDODRINE HCL 5 MG PO TABS
10.0000 mg | ORAL_TABLET | Freq: Three times a day (TID) | ORAL | Status: DC
  Administered 2021-08-10 – 2021-08-11 (×3): 10 mg via ORAL
  Filled 2021-08-10 (×3): qty 2

## 2021-08-10 MED ORDER — CLOPIDOGREL BISULFATE 75 MG PO TABS
75.0000 mg | ORAL_TABLET | Freq: Every day | ORAL | Status: DC
  Administered 2021-08-10 – 2021-08-11 (×2): 75 mg via ORAL
  Filled 2021-08-10 (×2): qty 1

## 2021-08-10 NOTE — Progress Notes (Signed)
Subjective: 5 Days Post-Op Procedure(s) (LRB): TOTAL HIP ARTHROPLASTY (Left) Patient denies any hip pain this morning. Plan is for discharge to SNF when medically stable. Negative for chest pain and shortness of breath Fever: no Gastrointestinal:NEgative for nausea and vomiting Patient has had a BM.  Objective: Vital signs in last 24 hours: Temp:  [98.2 F (36.8 C)-99 F (37.2 C)] 98.8 F (37.1 C) (09/05 0804) Pulse Rate:  [75-82] 78 (09/05 0804) Resp:  [14-18] 14 (09/05 0804) BP: (112-136)/(48-65) 112/48 (09/05 0804) SpO2:  [89 %-100 %] 97 % (09/05 0804)  Intake/Output from previous day:  Intake/Output Summary (Last 24 hours) at 08/10/2021 0943 Last data filed at 08/09/2021 0953 Gross per 24 hour  Intake --  Output 500 ml  Net -500 ml    Intake/Output this shift: No intake/output data recorded.  Labs: Recent Labs    08/09/21 0527 08/10/21 0733  HGB 8.3* 8.4*   Recent Labs    08/09/21 0527 08/10/21 0733  WBC 6.3 6.6  RBC 2.78* 2.96*  HCT 24.6* 26.6*  PLT 226 278   Recent Labs    08/08/21 0533  NA 137  K 3.9  CL 104  CO2 28  BUN 19  CREATININE 0.84  GLUCOSE 85  CALCIUM 8.1*   No results for input(s): LABPT, INR in the last 72 hours.   EXAM General - Patient is Alert, Appropriate, and Oriented Extremity - ABD soft Sensation intact distally Dorsiflexion/Plantar flexion intact Incision: scant drainage No cellulitis present Compartment soft Left thigh soft to palpation, no significant swelling is noted. Dressing/Incision - aquacell intact with scant drainage Motor Function - intact, moving foot and toes well on exam.  Abdomen soft with normal bowel sounds.  Past Medical History:  Diagnosis Date   Arthritis    Atheromatous plaque 10/18/2017   Back pain    Cancer ([place])    skin   Depression    Diverticulosis    Dizziness    patient had episode of dizziness when came in room. hx past no dx   GERD (gastroesophageal reflux disease)    Heart  murmur    Hyperlipidemia 10/18/2017   PONV (postoperative nausea and vomiting)    yrs ago   Squamous cell carcinoma of skin 11/16/2016   L chest parasternal   Squamous cell carcinoma of skin 02/05/2020   L cheek inf to lat zygoma   Stroke (HCC)     Assessment/Plan: 5 Days Post-Op Procedure(s) (LRB): TOTAL HIP ARTHROPLASTY (Left) Active Problems:   Status post total hip replacement, left   Acute hip pain, left   Irritation of left eye   Acute respiratory failure with hypoxia (HCC)   Interstitial lung disease (HCC)   Gastroesophageal reflux disease without esophagitis   Hypotension   Acute blood loss anemia   Confusion   Weakness  Estimated body mass index is 31.18 kg/m as calculated from the following:   Height as of this encounter: '5\' 4"'$  (1.626 m).   Weight as of this encounter: 82.4 kg. Advance diet Up with therapy Labs and vital signs are stable, Hgb trending up.  Confusion seems to have resolved.   Up with therapy today, currently recommending SNF. Ready for dc to snf from ortho standpoint  Upon discharge, continue Lovenox '40mg'$  daily for 14 days. Follow-up with [place] in 10-14 days for staple removal.  DVT Prophylaxis - Lovenox, Foot Pumps, and TED hose Weight-Bearing as tolerated to left leg  T. Rachelle Hora, PA-C [facility] Orthopaedic Surgery 08/10/2021, 9:43  AM  

## 2021-08-10 NOTE — Progress Notes (Signed)
Physical Therapy Treatment Patient Details Name: Melody Ochoa MRN: [id number] DOB: [birth year] Today's Date: 08/10/2021    History of Present Illness Pt is an 80 yo female with onset of fall with injury to R shoulder and L hip with imaging revealing osteopenia and severe, bone-on-bone arthrosis of the left hip.  Pt underwhent elective L THA 08/05/21.   PMH includes: L hip OA, R THA, osteopenia, depression, dyslipidemia, ILD, GERD, diverticulosis, chronic back pain, fibrotic lung disease, L rib fractures, posterior cervical fusion, cerebral cortical infarcts,    PT Comments    Pt seen for PT tx with pt only able to recall 1/3 posterior hip precautions. Pt demonstrates improving ability to transfer supine>sit & sit>stand at EOB. Pt is able to complete stand pivot transfer to recliner with mod assist with continued shuffling steps but is able to slightly clear floor with max cuing to attempt. After brief rest break sitting in recliner pt takes 2 steps forwards & backwards with RW with max assist then pt reports dizziness. Pt noted to have low BP after gait but with improvement once reclined in chair - nurse notified of BP readings. Continue to recommend STR upon d/c to maximize independence with functional mobility & reduce fall risk prior to return home.  Pt on 1L/min via nasal cannula with SPO2 89-93% BP checked in RUE: Sitting in recliner after transferring: 90/55 mmHg, MAP 67 Standing at 0: 92/50 mmHg MAP 64 After taking 2 steps forwards/backwards: 79/57 mmHg (MAP 62) Reclined in chair: 152/74 mmHg (MAP 95) Rechecked while reclined in chair: 140/70 mmHg (MAP 91)     Follow Up Recommendations  SNF     Equipment Recommendations  None recommended by PT    Recommendations for Other Services       Precautions / Restrictions Precautions Precautions: Posterior Hip;Fall Restrictions Weight Bearing Restrictions: Yes LLE Weight Bearing: Weight bearing as tolerated    Mobility  Bed  Mobility Overal bed mobility: Needs Assistance Bed Mobility: Supine to Sit     Supine to sit: Min assist     General bed mobility comments: Pt with improved ability to transfer supine>sit with significantly extra time, HOB elevated & bed rails but is able to do so with min assist.    Transfers Overall transfer level: Needs assistance Equipment used: Rolling walker (2 wheeled) Transfers: Sit to/from [facility] Sit to Stand: Mod assist Stand pivot transfers: Mod assist       General transfer comment: cuing for hand placement & assistance to power up  Ambulation/Gait Ambulation/Gait assistance: Max assist Gait Distance (Feet): 2 Feet Assistive device: Rolling walker (2 wheeled) Gait Pattern/deviations: Decreased stance time - left;Decreased weight shift to left;Decreased dorsiflexion - right;Decreased stride length;Decreased dorsiflexion - left;Decreased step length - right;Decreased step length - left Gait velocity: decreased   General Gait Details: 2 feet forwards & backwards with RW   Stairs             Wheelchair Mobility    Modified Rankin (Stroke Patients Only)       Balance Overall balance assessment: Needs assistance Sitting-balance support: Feet supported;Bilateral upper extremity supported Sitting balance-Leahy Scale: Poor Sitting balance - Comments: min assist for static sitting EOB but pt with improved midline orientation on this date & improved ability to weight shift to L hip   Standing balance support: Bilateral upper extremity supported;During functional activity Standing balance-Leahy Scale: Poor Standing balance comment: BUE support on RW & mod assist, slight R lateral/posterior lean  Cognition Arousal/Alertness: Lethargic Behavior During Therapy: Flat affect Overall Cognitive Status: Impaired/Different from baseline Area of Impairment: Attention;Following  commands;Orientation;Memory;Safety/judgement;Awareness;Problem solving                 Orientation Level: Person;Place;Situation   Memory: Decreased short-term memory;Decreased recall of precautions Following Commands: Follows one step commands consistently;Follows one step commands with increased time Safety/Judgement: Decreased awareness of safety Awareness: Anticipatory;Emergent;Intellectual Problem Solving: Slow processing;Requires verbal cues General Comments: Pt only able to recall 1/3 posterior hip precautions. Is able to report she's at [facility].      Exercises      General Comments General comments (skin integrity, edema, etc.): PT educates pt on 3/3 posterior hip precautions      Pertinent Vitals/Pain      Home Living                      Prior Function            PT Goals (current goals can now be found in the care plan section) Acute Rehab PT Goals Patient Stated Goal: to feel better PT Goal Formulation: With patient Time For Goal Achievement: 08/18/21 Potential to Achieve Goals: Fair Progress towards PT goals: Progressing toward goals    Frequency    BID      PT Plan Current plan remains appropriate    Co-evaluation              AM-PAC PT "6 Clicks" Mobility   Outcome Measure  Help needed turning from your back to your side while in a flat bed without using bedrails?: A Little Help needed moving from lying on your back to sitting on the side of a flat bed without using bedrails?: A Lot Help needed moving to and from a bed to a chair (including a wheelchair)?: A Lot Help needed standing up from a chair using your arms (e.g., wheelchair or bedside chair)?: A Lot Help needed to walk in hospital room?: A Lot Help needed climbing 3-5 steps with a railing? : Total 6 Click Score: 12    End of Session Equipment Utilized During Treatment: Gait belt;Oxygen Activity Tolerance: Patient tolerated treatment well;Patient limited by fatigue  (limited by low BP) Patient left: in chair;with chair alarm set;with call bell/phone within reach Nurse Communication: Mobility status (MD & nurse notified of BP) PT Visit Diagnosis: Unsteadiness on feet (R26.81);Muscle weakness (generalized) (M62.81);Other abnormalities of gait and mobility (R26.89);Pain;History of falling (Z91.81) Pain - Right/Left: Left Pain - part of body: Hip     Time: [id number] PT Time Calculation (min) (ACUTE ONLY): 23 min  Charges:  $Therapeutic Activity: 23-37 mins                     Lavone Nian, PT, DPT 08/10/21, 1:43 PM    Waunita Schooner 08/10/2021, 1:39 PM

## 2021-08-10 NOTE — Plan of Care (Signed)

## 2021-08-10 NOTE — Progress Notes (Signed)
Patient ID: Melody Ochoa, female   DOB: 04-25-1941, 80 y.o.   MRN: [id number] [facility] PROGRESS NOTE  Melody Ochoa [name] DOB: Oct 01, 1941 DOA: 07/30/2021 PCP: Burnard Hawthorne, FNP  HPI/Subjective: Patient frustrated that she was unable to get out to the rehab.  Now she states that she wants to go home.  Needed help to get into the chair.  Unable to reach patient's husband at this point.  Initially admitted with hip pain and had a left hip replacement.  Postoperatively needed a transfusion and needed to start midodrine for orthostatic hypotension  Objective: Vitals:   08/10/21 0804 08/10/21 1147  BP: (!) 112/48 118/60  Pulse: 78 72  Resp: 14 15  Temp: 98.8 F (37.1 C) 98.2 F (36.8 C)  SpO2: 97% 99%   No intake or output data in the 24 hours ending 08/10/21 1242 Filed Weights   07/30/21 1246 08/06/21 1411  Weight: 81.6 kg 82.4 kg    ROS: Review of Systems  Respiratory:  Negative for shortness of breath.   Cardiovascular:  Negative for chest pain.  Gastrointestinal:  Negative for abdominal pain.  Musculoskeletal:  Positive for joint pain.  Exam: Physical Exam HENT:     Head: Normocephalic.     Mouth/Throat:     Pharynx: No oropharyngeal exudate.  Eyes:     General: Lids are normal.     Conjunctiva/sclera: Conjunctivae normal.  Cardiovascular:     Rate and Rhythm: Normal rate and regular rhythm.     Heart sounds: Normal heart sounds, S1 normal and S2 normal.  Pulmonary:     Breath sounds: Examination of the right-lower field reveals decreased breath sounds. Examination of the left-lower field reveals decreased breath sounds. Decreased breath sounds present. No wheezing, rhonchi or rales.  Abdominal:     Palpations: Abdomen is soft.     Tenderness: There is no abdominal tenderness.  Musculoskeletal:     Right lower leg: No swelling.     Left lower leg: No swelling.  Skin:    General: Skin is warm.     Findings: No rash.  Neurological:      Mental Status: She is alert and oriented to person, place, and time.      Scheduled Meds:  aspirin EC  81 mg Oral Daily   atorvastatin  80 mg Oral Daily   buPROPion  300 mg Oral Daily   docusate sodium  100 mg Oral BID   DULoxetine  30 mg Oral Daily   enoxaparin (LOVENOX) injection  40 mg Subcutaneous Q24H   famotidine  20 mg Oral QHS   feeding supplement  237 mL Oral TID BM   fluticasone  2 spray Each Nare Daily   midodrine  10 mg Oral TID WC   multivitamin with minerals  1 tablet Oral Daily   pregabalin  100 mg Oral QHS     Assessment/Plan:  Orthostatic hypotension.  Continue to hold Norvasc.  Blood pressure dropping with working with physical therapy.  Needed to start midodrine yesterday.  We will increase the dose to 10 mg 3 times a day.  Hopefully will not need this medication for long.  Hold if systolic blood pressure greater than 160.  Likely deconditioning.  Since I added a new medication facility unable to take until tomorrow Postoperative acute blood loss.  Patient required a blood transfusion with a hemoglobin of 7.9 the other day.  Hemoglobin stable but on the lower side at 8.4. Left hip replacement.  Continue  oral pain medication.  Physical therapy still recommending rehab Confusion episode the other day.  Restarted Plavix. Acute hypoxic respiratory failure.  Continue oxygen 24/7 2 L.  Prior to disposition from rehab need to check if she is a candidate for home oxygen.  Had pulse ox of 83% on room air. Interstitial lung disease Hyperlipidemia unspecified on Lipitor GERD on H2 blocker Peripheral neuropathy on Lyrica Weakness.  Physical therapy still recommending rehab        Code Status:     Code Status Orders  (From admission, onward)           Start     Ordered   07/31/21 0214  Full code  Continuous        07/31/21 0214           Code Status History     Date Active Date Inactive Code Status Order ID Comments User Context   12/09/2020 2055  12/12/2020 1927 Full Code [id number]  Athena Masse, MD ED   09/30/2019 1229 10/01/2019 1855 Full Code [id number]  Epifanio Lesches, MD ED   06/13/2019 1259 06/15/2019 1621 Full Code [id number]  Lang Snow, NP ED   08/10/2017 0128 08/13/2017 1828 Full Code [id number]  Greta Doom, MD Inpatient   11/22/2014 0347 11/25/2014 1750 Full Code [id number]  [name], MD Inpatient   11/18/2014 1859 11/20/2014 1446 Full Code [id number]  Newman Pies, MD Inpatient      Family Communication: Tried to reach patient's husband Disposition Plan: Status is: Inpatient  Dispo: The patient is from: Home              Anticipated d/c is to: Rehab              Patient currently medically stable to go out to rehab.  Added midodrine yesterday so unable to take with new medication until Tuesday.   Difficult to place patient.  Yes.  Time spent: 27 minutes  [place]

## 2021-08-10 NOTE — Care Management Important Message (Signed)
Important Message  Patient Details  Name: CHRISELLE TOSCH MRN: [id number] Date of Birth: 1941-05-12   Medicare Important Message Given:  Yes     Juliann Pulse A Eleonore Shippee 08/10/2021, 2:03 PM

## 2021-08-11 ENCOUNTER — Other Ambulatory Visit: Payer: Self-pay

## 2021-08-11 DIAGNOSIS — I639 Cerebral infarction, unspecified: Secondary | ICD-10-CM | POA: Diagnosis not present

## 2021-08-11 DIAGNOSIS — Z96642 Presence of left artificial hip joint: Secondary | ICD-10-CM | POA: Diagnosis not present

## 2021-08-11 DIAGNOSIS — D649 Anemia, unspecified: Secondary | ICD-10-CM | POA: Diagnosis not present

## 2021-08-11 DIAGNOSIS — J8410 Pulmonary fibrosis, unspecified: Secondary | ICD-10-CM | POA: Diagnosis not present

## 2021-08-11 DIAGNOSIS — S72092A Other fracture of head and neck of left femur, initial encounter for closed fracture: Secondary | ICD-10-CM | POA: Diagnosis not present

## 2021-08-11 DIAGNOSIS — K219 Gastro-esophageal reflux disease without esophagitis: Secondary | ICD-10-CM | POA: Diagnosis not present

## 2021-08-11 DIAGNOSIS — I951 Orthostatic hypotension: Secondary | ICD-10-CM | POA: Diagnosis not present

## 2021-08-11 DIAGNOSIS — J9601 Acute respiratory failure with hypoxia: Secondary | ICD-10-CM | POA: Diagnosis not present

## 2021-08-11 DIAGNOSIS — H5789 Other specified disorders of eye and adnexa: Secondary | ICD-10-CM | POA: Diagnosis not present

## 2021-08-11 DIAGNOSIS — E785 Hyperlipidemia, unspecified: Secondary | ICD-10-CM | POA: Diagnosis not present

## 2021-08-11 DIAGNOSIS — S72092D Other fracture of head and neck of left femur, subsequent encounter for closed fracture with routine healing: Secondary | ICD-10-CM | POA: Diagnosis not present

## 2021-08-11 DIAGNOSIS — R279 Unspecified lack of coordination: Secondary | ICD-10-CM | POA: Diagnosis not present

## 2021-08-11 DIAGNOSIS — M6281 Muscle weakness (generalized): Secondary | ICD-10-CM | POA: Diagnosis not present

## 2021-08-11 DIAGNOSIS — R5381 Other malaise: Secondary | ICD-10-CM | POA: Diagnosis not present

## 2021-08-11 DIAGNOSIS — D62 Acute posthemorrhagic anemia: Secondary | ICD-10-CM | POA: Diagnosis not present

## 2021-08-11 DIAGNOSIS — M25552 Pain in left hip: Secondary | ICD-10-CM | POA: Diagnosis not present

## 2021-08-11 DIAGNOSIS — J849 Interstitial pulmonary disease, unspecified: Secondary | ICD-10-CM | POA: Diagnosis not present

## 2021-08-11 DIAGNOSIS — R410 Disorientation, unspecified: Secondary | ICD-10-CM | POA: Diagnosis not present

## 2021-08-11 DIAGNOSIS — R531 Weakness: Secondary | ICD-10-CM | POA: Diagnosis not present

## 2021-08-11 DIAGNOSIS — Z959 Presence of cardiac and vascular implant and graft, unspecified: Secondary | ICD-10-CM | POA: Diagnosis not present

## 2021-08-11 DIAGNOSIS — J9610 Chronic respiratory failure, unspecified whether with hypoxia or hypercapnia: Secondary | ICD-10-CM | POA: Diagnosis not present

## 2021-08-11 DIAGNOSIS — Z471 Aftercare following joint replacement surgery: Secondary | ICD-10-CM | POA: Diagnosis not present

## 2021-08-11 LAB — SARS CORONAVIRUS 2 (TAT 6-24 HRS): SARS Coronavirus 2: NEGATIVE

## 2021-08-11 MED ORDER — MIDODRINE HCL 10 MG PO TABS: 10.0000 mg | ORAL_TABLET | Freq: Three times a day (TID) | ORAL | 0 refills | Status: DC

## 2021-08-11 MED ORDER — ATORVASTATIN CALCIUM 80 MG PO TABS: 80.0000 mg | ORAL_TABLET | Freq: Every day | ORAL | 0 refills | Status: DC

## 2021-08-11 MED ORDER — FERROUS SULFATE 325 (65 FE) MG PO TABS: 325.0000 mg | ORAL_TABLET | Freq: Every day | ORAL | 0 refills | Status: DC

## 2021-08-11 MED ORDER — MIDODRINE HCL 10 MG PO TABS
10.0000 mg | ORAL_TABLET | Freq: Three times a day (TID) | ORAL | 0 refills | Status: DC
Start: 2021-08-11 — End: 2021-10-06

## 2021-08-11 NOTE — Progress Notes (Signed)
Pt discharged to [facility].  IV removed without complication.  Dressing changed.  AVS placed in packet.  All belongings at bedside taken with pt.  Pt transported to facility by EMS.  Report called to Leata Mouse, LPN

## 2021-08-11 NOTE — Discharge Summary (Signed)
[facility] NAME: Melody Ochoa    MR#:  [id number]  DATE OF BIRTH:  [birth year]  DATE OF ADMISSION:  07/30/2021 ADMITTING PHYSICIAN: Wyvonnia Dusky, MD  DATE OF DISCHARGE: 08/11/2021  PRIMARY CARE PHYSICIAN: Burnard [place], FNP    ADMISSION DIAGNOSIS:  Weakness [R53.1] Fall [W19.XXXA] Left hip pain [M25.552] Acute hip pain, left [M25.552]  DISCHARGE DIAGNOSIS:  Active Problems:   Status post total hip replacement, left   Acute hip pain, left   Irritation of left eye   Acute respiratory failure with hypoxia (HCC)   Interstitial lung disease (HCC)   Gastroesophageal reflux disease without esophagitis   Hypotension   Acute blood loss anemia   Confusion   Weakness   SECONDARY DIAGNOSIS:   Past Medical History:  Diagnosis Date  . Arthritis   . Atheromatous plaque 10/18/2017  . Back pain   . Cancer ([place])    skin  . Depression   . Diverticulosis   . Dizziness    patient had episode of dizziness when came in room. hx past no dx  . GERD (gastroesophageal reflux disease)   . Heart murmur   . Hyperlipidemia 10/18/2017  . PONV (postoperative nausea and vomiting)    yrs ago  . Squamous cell carcinoma of skin 11/16/2016   L chest parasternal  . Squamous cell carcinoma of skin 02/05/2020   L cheek inf to lat zygoma  . Stroke [facility])     HOSPITAL COURSE:   Left hip replacement on 08/05/2021 by Dr. Roland Rack.  Patient initially came in on 07/31/2021 with fall and unable to ambulate.  Patient on Lovenox injections and pain control with oral medications. Orthostatic hypotension.  Continue to hold Norvasc.  Blood pressure does drop with working with physical therapy.  Patient did receive a blood transfusion and fluid boluses.  I increase morningd midodrine to 10 mg 3 times a day.  Physical therapy needs to work with the patient 1 hour after midodrine given.  Hopefully will not be on midodrine for too long. Postoperative acute  blood loss anemia.  Patient was transfused 1 unit of packed red blood cells during the hospital course for hemoglobin of 7.9.  Hemoglobin did come up to 9.3 but I had to give fluid boluses after that.  Hemoglobin upon discharge 8.4.  Start ferrous sulfate.  Recommend checking a CBC in 1 week Intermittent confusion.  CT scan of the head negative.  Restarted Plavix with her history of stroke Acute hypoxic respiratory failure.  Pulse ox on room air today 88%.  Continue 24/7 oxygen 2 L.  Upon discharge from rehab check to see if she is a candidate with oxygen by checking a room air ambulatory pulse ox.  Patient chronically wears oxygen at night. Interstitial lung disease Hyperlipidemia unspecified on Lipitor GERD on H2 blocker Peripheral neuropathy on Lyrica Weakness.  Physical therapy recommends rehab History of nonhemorrhagic stroke on aspirin and Plavix  DISCHARGE CONDITIONS:   Satisfactory  CONSULTS OBTAINED:  Treatment Team:  Earnestine Leys, MD Poggi, Marshall Cork, MD  DRUG ALLERGIES:   Allergies  Allergen Reactions  . Latex Itching  . Aleve [Naproxen Sodium] Other (See Comments)    Gi upset  . Aspirin Other (See Comments)    Stomach pain-aggravates diverticulosis  . Celebrex [Celecoxib] Other (See Comments)    Dizziness   . Effexor [Venlafaxine] Other (See Comments)    Hot flashes   . Gabapentin Other (See Comments)    "  Night terrors"  . Hydrocodone Bit-Homatrop Mbr Diarrhea  . Ibuprofen Other (See Comments)    Gi upset   . Mobic [Meloxicam]     Stomach upset  . Tape Itching and Other (See Comments)    Itchy blisters  . Vioxx [Rofecoxib] Other (See Comments)    Gi upset  . Other Rash and Other (See Comments)    bandaides  . Tramadol Other (See Comments)    hallucinations    DISCHARGE MEDICATIONS:   Allergies as of 08/11/2021       Reactions   Latex Itching   Aleve [naproxen Sodium] Other (See Comments)   Gi upset   Aspirin Other (See Comments)   Stomach  pain-aggravates diverticulosis   Celebrex [celecoxib] Other (See Comments)   Dizziness   Effexor [venlafaxine] Other (See Comments)   Hot flashes   Gabapentin Other (See Comments)   "Night terrors"   Hydrocodone Bit-homatrop Mbr Diarrhea   Ibuprofen Other (See Comments)   Gi upset   Mobic [meloxicam]    Stomach upset   Tape Itching, Other (See Comments)   Itchy blisters   Vioxx [rofecoxib] Other (See Comments)   Gi upset   Other Rash, Other (See Comments)   bandaides   Tramadol Other (See Comments)   hallucinations        Medication List     STOP taking these medications    amLODipine 5 MG tablet Commonly known as: NORVASC   oxyCODONE-acetaminophen 5-325 MG tablet Commonly known as: Percocet       TAKE these medications    acetaminophen 500 MG tablet Commonly known as: TYLENOL Take 1-2 tablets (500-1,000 mg total) by mouth every 6 (six) hours as needed.   albuterol 108 (90 Base) MCG/ACT inhaler Commonly known as: VENTOLIN HFA TAKE 2 PUFFS BY MOUTH EVERY 6 HOURS AS NEEDED FOR WHEEZE OR SHORTNESS OF BREATH   Aspirin Low Dose 81 MG EC tablet Generic drug: aspirin TAKE 1 TABLET (81 MG TOTAL) BY MOUTH DAILY FOR 21 DAYS. SWALLOW WHOLE.   atorvastatin 80 MG tablet Commonly known as: LIPITOR Take 1 tablet (80 mg total) by mouth daily.   buPROPion 300 MG 24 hr tablet Commonly known as: WELLBUTRIN XL TAKE ONE TABLET BY MOUTH EVERY DAY   clopidogrel 75 MG tablet Commonly known as: PLAVIX Take 1 tablet (75 mg total) by mouth daily.   docusate sodium 100 MG capsule Commonly known as: COLACE Take 1 capsule (100 mg total) by mouth 2 (two) times daily.   DULoxetine 30 MG capsule Commonly known as: CYMBALTA TAKE 1 CAPSULE BY MOUTH EVERY DAY   enoxaparin 40 MG/0.4ML injection Commonly known as: LOVENOX Inject 0.4 mLs (40 mg total) into the skin daily.   famotidine 20 MG tablet Commonly known as: PEPCID TAKE ONE TABLET BY MOUTH AT BEDTIME   feeding  supplement Liqd Take 237 mLs by mouth 3 (three) times daily between meals.   ferrous sulfate 325 (65 FE) MG tablet Take 1 tablet (325 mg total) by mouth daily.   fluticasone 50 MCG/ACT nasal spray Commonly known as: FLONASE SPRAY 2 SPRAYS INTO EACH NOSTRIL EVERY DAY   menthol-cetylpyridinium 3 MG lozenge Commonly known as: CEPACOL Take 1 lozenge (3 mg total) by mouth as needed for sore throat.   midodrine 10 MG tablet Commonly known as: PROAMATINE Take 1 tablet (10 mg total) by mouth 3 (three) times daily with meals.   oxyCODONE 5 MG immediate release tablet Commonly known as: Oxy IR/ROXICODONE Take 0.5-1 tablets (2.5-5  mg total) by mouth every 4 (four) hours as needed for moderate pain (pain score 4-6).   pregabalin 50 MG capsule Commonly known as: LYRICA TAKE 1 CAPSULE (50 MG) BY MOUTH EVERY DAY AND TAKE 2 CAPSULES (100 MG) AT BEDTIME. Max daily dose '150mg'$ /day.               Durable Medical Equipment  (From admission, onward)           Start     Ordered   08/05/21 1122  DME Bedside commode  Once       Question:  Patient needs a bedside commode to treat with the following condition  Answer:  Status post total hip replacement, left   08/05/21 1122   08/05/21 1122  DME 3 n 1  Once        08/05/21 1122   08/05/21 1122  DME Walker rolling  Once       Question Answer Comment  Walker: With 5 Inch Wheels   Patient needs a walker to treat with the following condition Status post total hip replacement, left      08/05/21 1122             DISCHARGE INSTRUCTIONS:   Follow-up team at rehab 1 day Follow-up orthopedics 2 weeks  If you experience worsening of your admission symptoms, develop shortness of breath, life threatening emergency, suicidal or homicidal thoughts you must seek medical attention immediately by calling 911 or calling your MD immediately  if symptoms less severe.  You Must read complete instructions/literature along with all the possible  adverse reactions/side effects for all the Medicines you take and that have been prescribed to you. Take any new Medicines after you have completely understood and accept all the possible adverse reactions/side effects.   Please note  You were cared for by a hospitalist during your hospital stay. If you have any questions about your discharge medications or the care you received while you were in the hospital after you are discharged, you can call the unit and asked to speak with the hospitalist on call if the hospitalist that took care of you is not available. Once you are discharged, your primary care physician will handle any further medical issues. Please note that NO REFILLS for any discharge medications will be authorized once you are discharged, as it is imperative that you return to your primary care physician (or establish a relationship with a primary care physician if you do not have one) for your aftercare needs so that they can reassess your need for medications and monitor your lab values.    Today   CHIEF COMPLAINT:   Chief Complaint  Patient presents with  . Fall    HISTORY OF PRESENT ILLNESS:  Melody Ochoa  is a 80 y.o. female came in after a fall   VITAL SIGNS:  Blood pressure (!) 108/55, pulse 78, temperature 98.4 F (36.9 C), resp. rate 17, height '5\' 4"'$  (1.626 m), weight 82.4 kg, SpO2 (!) 88 %. Pulse ox was 88% on room air this morning.  Her oxygen was off I reapplied her oxygen.  PHYSICAL EXAMINATION:  GENERAL:  80 y.o.-year-old patient lying in the bed with no acute distress.  EYES: Pupils equal, round, reactive to light and accommodation. No scleral icterus. Extraocular muscles intact.  HEENT: Head atraumatic, normocephalic. Oropharynx and nasopharynx clear.  LUNGS: Decreased breath sounds bilateral bases, no wheezing, rales,rhonchi or crepitation. No use of accessory muscles of respiration.  CARDIOVASCULAR: S1, S2  normal. No murmurs, rubs, or gallops.   ABDOMEN: Soft, non-tender, non-distended.  EXTREMITIES: No pedal edema.  NEUROLOGIC: Cranial nerves II through XII are intact.  PSYCHIATRIC: The patient is alert and answers questions appropriately.  SKIN: No obvious rash, lesion, or ulcer.   DATA REVIEW:   CBC Recent Labs  Lab 08/10/21 0733  WBC 6.6  HGB 8.4*  HCT 26.6*  PLT 278    Chemistries  Recent Labs  Lab 08/08/21 0533  NA 137  K 3.9  CL 104  CO2 28  GLUCOSE 85  BUN 19  CREATININE 0.84  CALCIUM 8.1*     Microbiology Results  Results for orders placed or performed during the hospital encounter of 07/30/21  SARS CORONAVIRUS 2 (TAT 6-24 HRS) Nasopharyngeal Nasopharyngeal Swab     Status: None   Collection Time: 07/31/21  2:05 AM   Specimen: Nasopharyngeal Swab  Result Value Ref Range Status   SARS Coronavirus 2 NEGATIVE NEGATIVE Final    Comment: (NOTE) SARS-CoV-2 target nucleic acids are NOT DETECTED.  The SARS-CoV-2 RNA is generally detectable in upper and lower respiratory specimens during the acute phase of infection. Negative results do not preclude SARS-CoV-2 infection, do not rule out co-infections with other pathogens, and should not be used as the sole basis for treatment or other patient management decisions. Negative results must be combined with clinical observations, patient history, and epidemiological information. The expected result is Negative.  Fact Sheet for Patients: SugarRoll.be  Fact Sheet for Healthcare Providers: https://www.woods-mathews.com/  This test is not yet approved or cleared by the [place] FDA and  has been authorized for detection and/or diagnosis of SARS-CoV-2 by FDA under an Emergency Use Authorization (EUA). This EUA will remain  in effect (meaning this test can be used) for the duration of the COVID-19 declaration under Se ction 564(b)(1) of the Act, 21 U.S.C. section 360bbb-3(b)(1), unless the authorization is  terminated or revoked sooner.  Performed at [facility], [place] [address] [place], [place] [address]   Resp Panel by RT-PCR (Flu A&B, Covid) Nasopharyngeal Swab     Status: None   Collection Time: 08/08/21 10:42 AM   Specimen: Nasopharyngeal Swab; Nasopharyngeal(NP) swabs in vial transport medium  Result Value Ref Range Status   SARS Coronavirus 2 by RT PCR NEGATIVE NEGATIVE Final    Comment: (NOTE) SARS-CoV-2 target nucleic acids are NOT DETECTED.  The SARS-CoV-2 RNA is generally detectable in upper respiratory specimens during the acute phase of infection. The lowest concentration of SARS-CoV-2 viral copies this assay can detect is 138 copies/mL. A negative result does not preclude SARS-Cov-2 infection and should not be used as the sole basis for treatment or other patient management decisions. A negative result may occur with  improper specimen collection/handling, submission of specimen other than nasopharyngeal swab, presence of viral mutation(s) within the areas targeted by this assay, and inadequate number of viral copies(<138 copies/mL). A negative result must be combined with clinical observations, patient history, and epidemiological information. The expected result is Negative.  Fact Sheet for Patients:  EntrepreneurPulse.com.au  Fact Sheet for Healthcare Providers:  IncredibleEmployment.be  This test is no t yet approved or cleared by the [place] FDA and  has been authorized for detection and/or diagnosis of SARS-CoV-2 by FDA under an Emergency Use Authorization (EUA). This EUA will remain  in effect (meaning this test can be used) for the duration of the COVID-19 declaration under Section 564(b)(1) of the Act, 21 U.S.C.section 360bbb-3(b)(1), unless the authorization  is terminated  or revoked sooner.       Influenza A by PCR NEGATIVE NEGATIVE Final   Influenza B by PCR NEGATIVE NEGATIVE Final    Comment:  (NOTE) The Xpert Xpress SARS-CoV-2/FLU/RSV plus assay is intended as an aid in the diagnosis of influenza from Nasopharyngeal swab specimens and should not be used as a sole basis for treatment. Nasal washings and aspirates are unacceptable for Xpert Xpress SARS-CoV-2/FLU/RSV testing.  Fact Sheet for Patients: EntrepreneurPulse.com.au  Fact Sheet for Healthcare Providers: IncredibleEmployment.be  This test is not yet approved or cleared by the [place] FDA and has been authorized for detection and/or diagnosis of SARS-CoV-2 by FDA under an Emergency Use Authorization (EUA). This EUA will remain in effect (meaning this test can be used) for the duration of the COVID-19 declaration under Section 564(b)(1) of the Act, 21 U.S.C. section 360bbb-3(b)(1), unless the authorization is terminated or revoked.  Performed at [facility], [place], [place] [address]   SARS CORONAVIRUS 2 (TAT 6-24 HRS) Nasopharyngeal Nasopharyngeal Swab     Status: None   Collection Time: 08/10/21 10:43 AM   Specimen: Nasopharyngeal Swab  Result Value Ref Range Status   SARS Coronavirus 2 NEGATIVE NEGATIVE Final    Comment: (NOTE) SARS-CoV-2 target nucleic acids are NOT DETECTED.  The SARS-CoV-2 RNA is generally detectable in upper and lower respiratory specimens during the acute phase of infection. Negative results do not preclude SARS-CoV-2 infection, do not rule out co-infections with other pathogens, and should not be used as the sole basis for treatment or other patient management decisions. Negative results must be combined with clinical observations, patient history, and epidemiological information. The expected result is Negative.  Fact Sheet for Patients: SugarRoll.be  Fact Sheet for Healthcare Providers: https://www.woods-mathews.com/  This test is not yet approved or cleared by the  [place] FDA and  has been authorized for detection and/or diagnosis of SARS-CoV-2 by FDA under an Emergency Use Authorization (EUA). This EUA will remain  in effect (meaning this test can be used) for the duration of the COVID-19 declaration under Se ction 564(b)(1) of the Act, 21 U.S.C. section 360bbb-3(b)(1), unless the authorization is terminated or revoked sooner.  Performed at [facility], [place] [address] [place], [place] [address]       Management plans discussed with the patient,and she is in agreement.  CODE STATUS:     Code Status Orders  (From admission, onward)           Start     Ordered   07/31/21 0214  Full code  Continuous        07/31/21 0214           Code Status History     Date Active Date Inactive Code Status Order ID Comments User Context   12/09/2020 2055 12/12/2020 1927 Full Code [id number]  Athena Masse, MD ED   09/30/2019 1229 10/01/2019 1855 Full Code [id number]  Epifanio Lesches, MD ED   06/13/2019 1259 06/15/2019 1621 Full Code [id number]  Lang Snow, NP ED   08/10/2017 0128 08/13/2017 1828 Full Code [id number]  Greta Doom, MD Inpatient   11/22/2014 0347 11/25/2014 1750 Full Code [id number]  [name], MD Inpatient   11/18/2014 1859 11/20/2014 1446 Full Code [id number]  Newman Pies, MD Inpatient       TOTAL TIME TAKING CARE OF THIS PATIENT: 32 minutes.    Loletha Grayer M.D on 08/11/2021 at 7:43 AM  [facility]  CC: Primary care physician; Burnard [place], FNP

## 2021-08-11 NOTE — Progress Notes (Signed)
  Subjective: 6 Days Post-Op Procedure(s) (LRB): TOTAL HIP ARTHROPLASTY (Left) Patient denies any hip pain this morning. Plan is for discharge to SNF today Negative for chest pain and shortness of breath Fever: no Gastrointestinal:NEgative for nausea and vomiting Patient has had a BM.  Objective: Vital signs in last 24 hours: Temp:  [97.9 F (36.6 C)-98.8 F (37.1 C)] 98 F (36.7 C) (09/06 0752) Pulse Rate:  [72-81] 72 (09/06 0752) Resp:  [15-18] 18 (09/06 0752) BP: (108-149)/(55-69) 121/61 (09/06 0752) SpO2:  [88 %-99 %] 97 % (09/06 0752)  Intake/Output from previous day:  Intake/Output Summary (Last 24 hours) at 08/11/2021 0857 Last data filed at 08/10/2021 2233 Gross per 24 hour  Intake --  Output 500 ml  Net -500 ml    Intake/Output this shift: No intake/output data recorded.  Labs: Recent Labs    08/09/21 0527 08/10/21 0733  HGB 8.3* 8.4*   Recent Labs    08/09/21 0527 08/10/21 0733  WBC 6.3 6.6  RBC 2.78* 2.96*  HCT 24.6* 26.6*  PLT 226 278   No results for input(s): NA, K, CL, CO2, BUN, CREATININE, GLUCOSE, CALCIUM in the last 72 hours.  No results for input(s): LABPT, INR in the last 72 hours.   EXAM General - Patient is Alert, Appropriate, and Oriented Extremity - ABD soft Sensation intact distally Dorsiflexion/Plantar flexion intact Incision: scant drainage No cellulitis present Compartment soft Left thigh soft to palpation, no significant swelling is noted. Dressing/Incision - aquacell intact with scant drainage Motor Function - intact, moving foot and toes well on exam.  Abdomen soft with normal bowel sounds.  Past Medical History:  Diagnosis Date   Arthritis    Atheromatous plaque 10/18/2017   Back pain    Cancer ([place])    skin   Depression    Diverticulosis    Dizziness    patient had episode of dizziness when came in room. hx past no dx   GERD (gastroesophageal reflux disease)    Heart murmur    Hyperlipidemia 10/18/2017   PONV  (postoperative nausea and vomiting)    yrs ago   Squamous cell carcinoma of skin 11/16/2016   L chest parasternal   Squamous cell carcinoma of skin 02/05/2020   L cheek inf to lat zygoma   Stroke ([facility])     Assessment/Plan: 6 Days Post-Op Procedure(s) (LRB): TOTAL HIP ARTHROPLASTY (Left) Active Problems:   Status post total hip replacement, left   Acute hip pain, left   Irritation of left eye   Acute respiratory failure with hypoxia (HCC)   Interstitial lung disease (HCC)   Gastroesophageal reflux disease without esophagitis   Hypotension   Acute blood loss anemia   Confusion   Weakness  Estimated body mass index is 31.18 kg/m as calculated from the following:   Height as of this encounter: '5\' 4"'$  (1.626 m).   Weight as of this encounter: 82.4 kg. Advance diet Up with therapy Labs and vital signs are stable, Hgb trending up.  Confusion seems to have resolved.   Up with therapy today, currently recommending SNF. Ready for dc to snf from ortho standpoint  Upon discharge, continue Lovenox '40mg'$  daily for 14 days. Follow-up with [facility] in 10-14 days for staple removal.  DVT Prophylaxis - Lovenox, Foot Pumps, and TED hose Weight-Bearing as tolerated to left leg  T. Rachelle Hora, PA-C [facility] 08/11/2021, 8:57 AM

## 2021-08-11 NOTE — TOC Progression Note (Signed)
Transition of Care [facility]) - Progression Note    Patient Details  Name: Melody Ochoa MRN: [id number] Date of Birth: 10-27-1941  Transition of Care [facility]) CM/SW [place], RN Phone Number: 08/11/2021, 10:31 AM  Clinical Narrative:     The patient to DC to [facility] room [address] B wing today and [facility] to pick up at 1 PM top transport    Barriers to Discharge: Barriers Resolved  Expected Discharge Plan and Services           Expected Discharge Date: 08/11/21                                     Social Determinants of Health (SDOH) Interventions    Readmission Risk Interventions Readmission Risk Prevention Plan 06/15/2019  Post Dischage Appt Complete  Medication Screening Complete  Transportation Screening Complete  Some recent data might be hidden

## 2021-08-13 DIAGNOSIS — J8410 Pulmonary fibrosis, unspecified: Secondary | ICD-10-CM | POA: Diagnosis not present

## 2021-08-14 ENCOUNTER — Telehealth: Payer: Self-pay | Admitting: Family

## 2021-08-14 NOTE — Telephone Encounter (Signed)
Rejection Reason - Patient was No Show" Melody Ochoa said on Aug 13, 2021 3:09 PM  Pt appt was on 07/26/2021 at 3:00 pm  Msg from [facility] [place] [facility]

## 2021-08-17 DIAGNOSIS — S72092A Other fracture of head and neck of left femur, initial encounter for closed fracture: Secondary | ICD-10-CM | POA: Diagnosis not present

## 2021-08-17 DIAGNOSIS — D649 Anemia, unspecified: Secondary | ICD-10-CM | POA: Diagnosis not present

## 2021-08-17 DIAGNOSIS — J849 Interstitial pulmonary disease, unspecified: Secondary | ICD-10-CM | POA: Diagnosis not present

## 2021-08-17 DIAGNOSIS — J9610 Chronic respiratory failure, unspecified whether with hypoxia or hypercapnia: Secondary | ICD-10-CM | POA: Diagnosis not present

## 2021-08-17 DIAGNOSIS — E785 Hyperlipidemia, unspecified: Secondary | ICD-10-CM | POA: Diagnosis not present

## 2021-08-18 ENCOUNTER — Ambulatory Visit (INDEPENDENT_AMBULATORY_CARE_PROVIDER_SITE_OTHER): Payer: Medicare Other | Admitting: Internal Medicine

## 2021-08-18 ENCOUNTER — Other Ambulatory Visit: Payer: Self-pay

## 2021-08-18 ENCOUNTER — Encounter: Payer: Self-pay | Admitting: Internal Medicine

## 2021-08-18 VITALS — BP 84/46 | HR 80 | Ht 64.0 in | Wt 171.0 lb

## 2021-08-18 DIAGNOSIS — I639 Cerebral infarction, unspecified: Secondary | ICD-10-CM

## 2021-08-18 DIAGNOSIS — Z959 Presence of cardiac and vascular implant and graft, unspecified: Secondary | ICD-10-CM

## 2021-08-18 MED ORDER — FLUDROCORTISONE ACETATE 0.1 MG PO TABS: 0.1000 mg | ORAL_TABLET | Freq: Two times a day (BID) | ORAL | 6 refills | Status: DC

## 2021-08-18 NOTE — Telephone Encounter (Signed)
I spoke with Deborah Chalk at Dr. Trena Platt office & she will have his nurse Wells Guiles call me back.

## 2021-08-18 NOTE — Patient Instructions (Addendum)
Medication Instructions:  - Your physician has recommended you make the following change in your medication:   1) START florinef 0.1 mg- take 1 tablet by mouth twice daily    *If you need a refill on your cardiac medications before your next appointment, please call your pharmacy*   Lab Work: - Your physician recommends that you have lab work in: 2 weeks- BMP (to be drawn at [facility])  Fax results to [phone] Attn: Dr. Remo Lipps Klein/ Alvis Lemmings, RN   If you have labs (blood work) drawn today and your tests are completely normal, you will receive your results only by: [place] (if you have MyChart) OR A paper copy in the mail If you have any lab test that is abnormal or we need to change your treatment, we will call you to review the results.   Testing/Procedures: - none ordered   Follow-Up: At [facility], you and your health needs are our priority.  As part of our continuing mission to provide you with exceptional heart care, we have created designated Provider Care Teams.  These Care Teams include your primary Cardiologist (physician) and Advanced Practice Providers (APPs -  Physician Assistants and Nurse Practitioners) who all work together to provide you with the care you need, when you need it.  We recommend signing up for the patient portal called "MyChart".  Sign up information is provided on this After Visit Summary.  MyChart is used to connect with patients for Virtual Visits (Telemedicine).  Patients are able to view lab/test results, encounter notes, upcoming appointments, etc.  Non-urgent messages can be sent to your provider as well.   To learn more about what you can do with MyChart, go to [email or web address].    Your next appointment:   3 month(s)  The format for your next appointment:   In Person  Provider:   Virl Axe, MD   Other Instructions  FLORINEF (Fludrocortisone) tablets What is this medication? FLUDROCORTISONE (floo  droe KOR ti sone) is a corticosteroid. It is used to treat Addison's disease and to treat a salt losing condition called adrenogenital syndrome. This medicine may be used for other purposes; ask your health care provider or pharmacist if you have questions. COMMON BRAND NAME(S): Florinef What should I tell my care team before I take this medication? They need to know if you have any of these conditions: Cushing's syndrome diabetes heart problems or disease high blood pressure infection like herpes, measles, tuberculosis, or chickenpox liver disease myasthenia gravis osteoporosis stomach, ulcer or intestine disease including colitis and diverticulitis thyroid problem an unusual or allergic reaction to fludrocortisone, corticosteroids, other medicines, lactose, foods, dyes, or preservatives pregnant or trying to get pregnant breast-feeding How should I use this medication? Take this medicine by mouth with a glass of water. Follow the directions on the prescription label. Take it with food or milk to avoid stomach upset. If you are taking this medicine once a day, take it in the morning. Do not take more medicine than you are told to take. Do not suddenly stop taking your medicine because you may develop a severe reaction. Your doctor will tell you how much medicine to take. If your doctor wants you to stop the medicine, the dose may be slowly lowered over time to avoid any side effects. Talk to your pediatrician regarding the use of this medicine in children. Special care may be needed. Patients over 57 years old may have a stronger reaction and need a smaller  dose. Overdosage: If you think you have taken too much of this medicine contact a poison control center or emergency room at once. NOTE: This medicine is only for you. Do not share this medicine with others. What if I miss a dose? If you miss a dose, take it as soon as you can. If it is almost time for your next dose, take only that  dose. Do not take double or extra doses. What may interact with this medication? Do not take this medicine with any of the following medications: mifepristone, RU-486 This medicine may also interact with the following medications: amphotericin B aspirin and aspirin-like drugs barbiturates like phenobarbital digoxin diuretics female hormones, like estrogens or progestins and birth control pills female hormones medicines for diabetes like insulin medicines that treat or prevent blood clots like warfarin phenytoin rifampin vaccines This list may not describe all possible interactions. Give your health care provider a list of all the medicines, herbs, non-prescription drugs, or dietary supplements you use. Also tell them if you smoke, drink alcohol, or use illegal drugs. Some items may interact with your medicine. What should I watch for while using this medication? Visit your doctor or health care professional for regular checks on your progress. If you are taking this medicine over a prolonged period, carry an identification card with your name and address, the type and dose of your medicine, and your doctor's name and address. This medicine may increase your risk of getting an infection. Stay away from people who are sick. Tell your doctor or health care professional if you are around anyone with measles or chickenpox. If you are going to have surgery, tell your doctor or health care professional that you have taken this medicine within the last twelve months. Ask your doctor or health care professional about your diet. You may need to lower the amount of salt you eat. This medicine may increase blood sugar. Ask your healthcare provider if changes in diet or medicines are needed if you have diabetes. What side effects may I notice from receiving this medication? Side effects that you should report to your doctor or health care professional as soon as possible: mental depression, mood swings,  mistaken feelings of self importance or of being mistreated  signs and symptoms of high blood sugar such as being more thirsty or hungry or having to urinate more than normal. You may also feel very tired or have blurry vision. sudden weight gain swelling of the feet or lower legs Side effects that usually do not require medical attention (report to your doctor or health care professional if they continue or are bothersome): dizziness headache loss of appetite nausea, vomiting trouble sleeping This list may not describe all possible side effects. Call your doctor for medical advice about side effects. You may report side effects to FDA at 1-800-FDA-1088. Where should I keep my medication? Keep out of the reach of children. Store at room temperature between 15 and 30 degrees C (59 and 86 degrees F). Protect from excessive heat. Throw away any unused medicine after the expiration date. NOTE: This sheet is a summary. It may not cover all possible information. If you have questions about this medicine, talk to your doctor, pharmacist, or health care provider.  2022 Elsevier/Gold Standard (2018-08-23 11:12:19)

## 2021-08-18 NOTE — Telephone Encounter (Signed)
Call dr Marta Antu concerned as it relates to pt being on both plavix '75mg'$  and asa '81mg'$ .   she was having repeat falls , profound anemia.   Recent hospitalization for left hip replacement, she received a blood transfusion  Does Dr Manuella Ghazi recommend both plavix AND aspirin?   If he prefers follow up with her discuss, please schedule and let pt know the above.

## 2021-08-18 NOTE — Progress Notes (Signed)
ELECTROPHYSIOLOGY OFFICE NOTE  Patient ID: Melody Ochoa, MRN: [id number], DOB/AGE: 03/23/1941 80 y.o. Admit date: (Not on file) Date of Consult: 08/18/2021  Primary Physician: Burnard Hawthorne, FNP Primary Cardiologist: new      HPI Melody Ochoa is a 80 y.o. female  Seen following ILR LINQ implantation 9/18 for Cryptogenic Stroke by [facility] for which has been treated with ASA and plavix;( was to see [facility] neuro 7/22) also with problems with orthostatic intolerance   No chest pain but severe and worsening DOE diagnosed with "COPD:"  hrCT Spectrum of findings compatible with fibrotic interstitial lung disease with mild honeycombing and without a clear apicobasilar gradient. No convincing interval progression since 05/21/2019 chest CT, although with mild progression since baseline 2018 chest CT.  Been increasingly non ambulatory since COVID  2 years ago was walking 2 miles;  Ended up with hip replacement surgery 8/22 from which she has not well recovered, still non ambulatory  BP have been quite low post operative   Wheel chair bound 2/2 hip pain    7/20 Echocardiogram demonstrated EF 50-55%, mild LVH, grade 1 diastolic dysfunction, LA 44.  TEE  Bulky aortic calcified plaque/ MR   Interval hospitalization for fall cx by fractured hip; post op anemia   Date LDL  9/18 126  10 /20 67   Date Cr K Hgb  6/21 1.19 3.9 11.1   9/22 0.84 3.9 8.4         Past Medical History:  Diagnosis Date   Arthritis    Atheromatous plaque 10/18/2017   Back pain    Cancer ([place])    skin   Depression    Diverticulosis    Dizziness    patient had episode of dizziness when came in room. hx past no dx   GERD (gastroesophageal reflux disease)    Heart murmur    Hyperlipidemia 10/18/2017   PONV (postoperative nausea and vomiting)    yrs ago   Squamous cell carcinoma of skin 11/16/2016   L chest parasternal   Squamous cell carcinoma of skin 02/05/2020   L cheek inf to lat zygoma    Stroke [facility])       Surgical History:  Past Surgical History:  Procedure Laterality Date   BACK SURGERY  13   BREAST BIOPSY Bilateral    cores "years ago"   BREAST EXCISIONAL BIOPSY Left 90's   CATARACT EXTRACTION W/PHACO Left 12/20/2018   Procedure: CATARACT EXTRACTION PHACO AND INTRAOCULAR LENS PLACEMENT ([place]) LEFT TOPICAL;  Surgeon: Leandrew Koyanagi, MD;  Location: [place];  Service: Ophthalmology;  Laterality: Left;   CATARACT EXTRACTION W/PHACO Right 01/24/2019   Procedure: CATARACT EXTRACTION PHACO AND INTRAOCULAR LENS PLACEMENT ([place]) RIGHT;  Surgeon: Leandrew Koyanagi, MD;  Location: [place];  Service: Ophthalmology;  Laterality: Right;   CERVICAL DISC SURGERY  09   COLONOSCOPY WITH PROPOFOL N/A 01/13/2016   Procedure: COLONOSCOPY WITH PROPOFOL;  Surgeon: [name], MD;  Location: [facility] ENDOSCOPY;  Service: Endoscopy;  Laterality: N/A;   COLONOSCOPY WITH PROPOFOL N/A 02/19/2020   Procedure: COLONOSCOPY WITH PROPOFOL;  Surgeon: [name], MD;  Location: [facility] ENDOSCOPY;  Service: Gastroenterology;  Laterality: N/A;   FACELIFT  94   LOOP RECORDER INSERTION N/A 08/23/2017   Procedure: LOOP RECORDER INSERTION;  Surgeon: Thompson Grayer, MD;  Location: [place] CV LAB;  Service: Cardiovascular;  Laterality: N/A;   TEE WITHOUT CARDIOVERSION N/A 08/12/2017   Procedure: TRANSESOPHAGEAL ECHOCARDIOGRAM (TEE);  Surgeon: Fransico Him  R, MD;  Location: [place];  Service: Cardiovascular;  Laterality: N/A;   TONSILLECTOMY     TOTAL HIP ARTHROPLASTY Right 04/22/2016   Procedure: TOTAL HIP ARTHROPLASTY;  Surgeon: Corky Mull, MD;  Location: [facility] ORS;  Service: Orthopedics;  Laterality: Right;   TOTAL HIP ARTHROPLASTY Left 08/05/2021   Procedure: TOTAL HIP ARTHROPLASTY;  Surgeon: Corky Mull, MD;  Location: [facility] ORS;  Service: Orthopedics;  Laterality: Left;     Home Meds: Prior to Admission medications   Medication Sig Start Date End Date Taking? Authorizing  Provider  atorvastatin (LIPITOR) 40 MG tablet Take 1 tablet (40 mg total) by mouth daily at 6 PM. 08/12/17  Yes Patteson, Arlan Organ, NP  buPROPion (WELLBUTRIN XL) 300 MG 24 hr tablet Take 300 mg by mouth daily.  07/15/17  Yes [provider]  Calcium Carbonate-Vitamin D (CALTRATE 600+D PO) Take 1 tablet by mouth daily.   Yes [provider]  clopidogrel (PLAVIX) 75 MG tablet Take 1 tablet (75 mg total) by mouth daily. 08/13/17  Yes Patteson, Arlan Organ, NP  Cyanocobalamin (VITAMIN B-12 PO) Take 1 tablet by mouth daily.   Yes [provider]  DULoxetine (CYMBALTA) 60 MG capsule Take 60 mg by mouth daily.    Yes [provider]  ferrous sulfate 325 (65 FE) MG tablet Take 1 tablet (325 mg total) by mouth daily. 08/12/17  Yes Patteson, Arlan Organ, NP  gabapentin (NEURONTIN) 300 MG capsule Take 300 mg by mouth at bedtime.  09/01/14  Yes [provider]  vitamin C (ASCORBIC ACID) 500 MG tablet Take 500 mg by mouth daily.   Yes [provider]    Current Outpatient Medications:    acetaminophen (TYLENOL) 500 MG tablet, Take 1-2 tablets (500-1,000 mg total) by mouth every 6 (six) hours as needed., Disp: 60 tablet, Rfl: 0   albuterol (VENTOLIN HFA) 108 (90 Base) MCG/ACT inhaler, TAKE 2 PUFFS BY MOUTH EVERY 6 HOURS AS NEEDED FOR WHEEZE OR SHORTNESS OF BREATH, Disp: 8.5 each, Rfl: 2   ASPIRIN LOW DOSE 81 MG EC tablet, TAKE 1 TABLET (81 MG TOTAL) BY MOUTH DAILY FOR 21 DAYS. SWALLOW WHOLE., Disp: 21 tablet, Rfl: 0   atorvastatin (LIPITOR) 80 MG tablet, Take 1 tablet (80 mg total) by mouth daily., Disp: 90 tablet, Rfl: 0   buPROPion (WELLBUTRIN XL) 300 MG 24 hr tablet, TAKE ONE TABLET BY MOUTH EVERY DAY, Disp: 90 tablet, Rfl: 1   clopidogrel (PLAVIX) 75 MG tablet, Take 1 tablet (75 mg total) by mouth daily., Disp: 90 tablet, Rfl: 3   docusate sodium (COLACE) 100 MG capsule, Take 1 capsule (100 mg total) by mouth 2 (two) times daily., Disp: 60 capsule, Rfl: 0    DULoxetine (CYMBALTA) 30 MG capsule, TAKE 1 CAPSULE BY MOUTH EVERY DAY, Disp: 90 capsule, Rfl: 1   enoxaparin (LOVENOX) 40 MG/0.4ML injection, Inject 0.4 mLs (40 mg total) into the skin daily., Disp: 5.6 mL, Rfl: 0   famotidine (PEPCID) 20 MG tablet, TAKE ONE TABLET BY MOUTH AT BEDTIME, Disp: 90 tablet, Rfl: 1   feeding supplement (ENSURE ENLIVE / ENSURE PLUS) LIQD, Take 237 mLs by mouth 3 (three) times daily between meals., Disp: 14220 mL, Rfl: 0   ferrous sulfate 325 (65 FE) MG tablet, Take 1 tablet (325 mg total) by mouth daily., Disp: 30 tablet, Rfl: 0   fluticasone (FLONASE) 50 MCG/ACT nasal spray, SPRAY 2 SPRAYS INTO EACH NOSTRIL EVERY DAY, Disp: 48 mL, Rfl: 1   menthol-cetylpyridinium (  CEPACOL) 3 MG lozenge, Take 1 lozenge (3 mg total) by mouth as needed for sore throat., Disp: 15 tablet, Rfl: 12   midodrine (PROAMATINE) 10 MG tablet, Take 1 tablet (10 mg total) by mouth 3 (three) times daily with meals., Disp: 90 tablet, Rfl: 0   oxyCODONE (OXY IR/ROXICODONE) 5 MG immediate release tablet, Take 0.5-1 tablets (2.5-5 mg total) by mouth every 4 (four) hours as needed for moderate pain (pain score 4-6)., Disp: 60 tablet, Rfl: 0   pregabalin (LYRICA) 100 MG capsule, Take 100 mg by mouth daily., Disp: , Rfl:    pregabalin (LYRICA) 50 MG capsule, TAKE 1 CAPSULE (50 MG) BY MOUTH EVERY DAY AND TAKE 2 CAPSULES (100 MG) AT BEDTIME. Max daily dose '150mg'$ /day., Disp: 90 capsule, Rfl: 1   Allergies:  Allergies  Allergen Reactions   Latex Itching   Aleve [Naproxen Sodium] Other (See Comments)    Gi upset   Aspirin Other (See Comments)    Stomach pain-aggravates diverticulosis   Celebrex [Celecoxib] Other (See Comments)    Dizziness    Effexor [Venlafaxine] Other (See Comments)    Hot flashes    Gabapentin Other (See Comments)    "Night terrors"   Hydrocodone Bit-Homatrop Mbr Diarrhea   Ibuprofen Other (See Comments)    Gi upset    Mobic [Meloxicam]     Stomach upset   Tape Itching and  Other (See Comments)    Itchy blisters   Vioxx [Rofecoxib] Other (See Comments)    Gi upset   Other Rash and Other (See Comments)    bandaides   Tramadol Other (See Comments)    hallucinations     ROS:  Please see the history of present illness.     All other systems reviewed and negative.   Physical Examination: BP (!) 84/46 (BP Location: Right Arm, Patient Position: Sitting, Cuff Size: Normal)   Pulse 80   Ht '5\' 4"'$  (1.626 m)   Wt 171 lb (77.6 kg)   SpO2 98%   BMI 29.35 kg/m  Well developed and nourished in no acute distress HENT normal Neck supple with JVP-not measured in a chair Clear Regular rate and rhythm, no murmurs or gallops Abd-soft with active BS No Clubbing cyanosis edema Skin-warm and dry A & Oriented  Grossly normal sensory and motor function  ECG     ELINQ interrogation 05/26/20>> no longer able to interrogate  Assessment and Plan:  Cryptogenic Stroke   Aortic plaque  LINQ in place  Hyperlipidemia  Orthostatic LH    DOE   Volume overload   Now chair bound following hip surgery and persistent pain  hoping to be able to get home With low BP  will begin florinef 0.1 bid  and will need BmET in about 2 weeks  Linq now longer accessible  will discontinue monitoring    .  Virl Axe

## 2021-08-19 ENCOUNTER — Telehealth: Payer: Self-pay | Admitting: Family

## 2021-08-19 NOTE — Telephone Encounter (Signed)
See phone note from today 9/14. Pt is to stop ASA 81 mg. LM for patient to callback.

## 2021-08-19 NOTE — Telephone Encounter (Signed)
LM on patient's cell to please call back.

## 2021-08-19 NOTE — Telephone Encounter (Signed)
Call pt  I consulted with cardiology, Dr. Caryl Comes and also Dr. Manuella Ghazi.  We all agreed in the setting of anemia, falls, she should stop aspirin 81 mg.  She may be maintained on Plavix '75mg'$  qd alone due to her history of stroke

## 2021-08-20 NOTE — Telephone Encounter (Signed)
Patient calling back in. Patient informed and verbalized understanding.

## 2021-08-20 NOTE — Telephone Encounter (Signed)
LMTCB

## 2021-08-24 DIAGNOSIS — S72092D Other fracture of head and neck of left femur, subsequent encounter for closed fracture with routine healing: Secondary | ICD-10-CM | POA: Diagnosis not present

## 2021-08-24 DIAGNOSIS — E785 Hyperlipidemia, unspecified: Secondary | ICD-10-CM | POA: Diagnosis not present

## 2021-08-24 DIAGNOSIS — J9610 Chronic respiratory failure, unspecified whether with hypoxia or hypercapnia: Secondary | ICD-10-CM | POA: Diagnosis not present

## 2021-08-31 DIAGNOSIS — R279 Unspecified lack of coordination: Secondary | ICD-10-CM | POA: Diagnosis not present

## 2021-08-31 DIAGNOSIS — M6281 Muscle weakness (generalized): Secondary | ICD-10-CM | POA: Diagnosis not present

## 2021-08-31 DIAGNOSIS — R2689 Other abnormalities of gait and mobility: Secondary | ICD-10-CM | POA: Diagnosis not present

## 2021-09-01 ENCOUNTER — Ambulatory Visit: Payer: Medicare Other

## 2021-09-01 ENCOUNTER — Telehealth: Payer: Self-pay

## 2021-09-01 DIAGNOSIS — I10 Essential (primary) hypertension: Secondary | ICD-10-CM | POA: Diagnosis not present

## 2021-09-01 DIAGNOSIS — M6281 Muscle weakness (generalized): Secondary | ICD-10-CM | POA: Diagnosis not present

## 2021-09-01 DIAGNOSIS — R2689 Other abnormalities of gait and mobility: Secondary | ICD-10-CM | POA: Diagnosis not present

## 2021-09-01 DIAGNOSIS — R279 Unspecified lack of coordination: Secondary | ICD-10-CM | POA: Diagnosis not present

## 2021-09-01 NOTE — Telephone Encounter (Signed)
Unable to reach patient for scheduled AWV on preferred phone number in appointment notes. Phone is out of service. No voicemail. Unable to reach patient on preferred number in point of contact as well. Left voicemail message to call the office back and reschedule.

## 2021-09-04 ENCOUNTER — Telehealth: Payer: Self-pay | Admitting: Family

## 2021-09-04 DIAGNOSIS — R2689 Other abnormalities of gait and mobility: Secondary | ICD-10-CM | POA: Diagnosis not present

## 2021-09-04 DIAGNOSIS — M6281 Muscle weakness (generalized): Secondary | ICD-10-CM | POA: Diagnosis not present

## 2021-09-04 DIAGNOSIS — D649 Anemia, unspecified: Secondary | ICD-10-CM

## 2021-09-04 DIAGNOSIS — R279 Unspecified lack of coordination: Secondary | ICD-10-CM | POA: Diagnosis not present

## 2021-09-04 NOTE — Telephone Encounter (Signed)
Pt is scheduled 10/17. No available appointments. Do want me to keep her on the 17th or would you like to add on?

## 2021-09-04 NOTE — Telephone Encounter (Signed)
Patient needing a hospital follow up next week as she will discharged form the skilled nursing home Monday.   No appointments available until 09/21/21. Please advise

## 2021-09-04 NOTE — Telephone Encounter (Signed)
FYI-Eric from [facility] will take pt on skill nursing pt and ot.  Thank you!

## 2021-09-05 DIAGNOSIS — R2689 Other abnormalities of gait and mobility: Secondary | ICD-10-CM | POA: Diagnosis not present

## 2021-09-05 DIAGNOSIS — M6281 Muscle weakness (generalized): Secondary | ICD-10-CM | POA: Diagnosis not present

## 2021-09-05 DIAGNOSIS — R279 Unspecified lack of coordination: Secondary | ICD-10-CM | POA: Diagnosis not present

## 2021-09-06 DIAGNOSIS — M6281 Muscle weakness (generalized): Secondary | ICD-10-CM | POA: Diagnosis not present

## 2021-09-06 DIAGNOSIS — R2689 Other abnormalities of gait and mobility: Secondary | ICD-10-CM | POA: Diagnosis not present

## 2021-09-06 DIAGNOSIS — R279 Unspecified lack of coordination: Secondary | ICD-10-CM | POA: Diagnosis not present

## 2021-09-07 DIAGNOSIS — E785 Hyperlipidemia, unspecified: Secondary | ICD-10-CM | POA: Diagnosis not present

## 2021-09-07 DIAGNOSIS — S72092D Other fracture of head and neck of left femur, subsequent encounter for closed fracture with routine healing: Secondary | ICD-10-CM | POA: Diagnosis not present

## 2021-09-07 DIAGNOSIS — J9610 Chronic respiratory failure, unspecified whether with hypoxia or hypercapnia: Secondary | ICD-10-CM | POA: Diagnosis not present

## 2021-09-07 NOTE — Telephone Encounter (Signed)
Can you look at my schedule and see if any sooner appts for f/u?

## 2021-09-08 ENCOUNTER — Other Ambulatory Visit: Payer: Self-pay

## 2021-09-08 ENCOUNTER — Ambulatory Visit: Payer: Medicare Other | Admitting: Registered Nurse

## 2021-09-08 DIAGNOSIS — S72092D Other fracture of head and neck of left femur, subsequent encounter for closed fracture with routine healing: Secondary | ICD-10-CM | POA: Diagnosis not present

## 2021-09-08 DIAGNOSIS — Z9981 Dependence on supplemental oxygen: Secondary | ICD-10-CM | POA: Diagnosis not present

## 2021-09-08 DIAGNOSIS — Z7902 Long term (current) use of antithrombotics/antiplatelets: Secondary | ICD-10-CM | POA: Diagnosis not present

## 2021-09-08 DIAGNOSIS — Z7982 Long term (current) use of aspirin: Secondary | ICD-10-CM | POA: Diagnosis not present

## 2021-09-08 DIAGNOSIS — Z9181 History of falling: Secondary | ICD-10-CM | POA: Diagnosis not present

## 2021-09-08 DIAGNOSIS — J8410 Pulmonary fibrosis, unspecified: Secondary | ICD-10-CM | POA: Diagnosis not present

## 2021-09-08 DIAGNOSIS — Z91199 Patient's noncompliance with other medical treatment and regimen due to unspecified reason: Secondary | ICD-10-CM

## 2021-09-08 DIAGNOSIS — I10 Essential (primary) hypertension: Secondary | ICD-10-CM | POA: Diagnosis not present

## 2021-09-08 DIAGNOSIS — H5789 Other specified disorders of eye and adnexa: Secondary | ICD-10-CM | POA: Diagnosis not present

## 2021-09-08 DIAGNOSIS — I951 Orthostatic hypotension: Secondary | ICD-10-CM | POA: Diagnosis not present

## 2021-09-08 DIAGNOSIS — E785 Hyperlipidemia, unspecified: Secondary | ICD-10-CM | POA: Diagnosis not present

## 2021-09-08 DIAGNOSIS — Z8673 Personal history of transient ischemic attack (TIA), and cerebral infarction without residual deficits: Secondary | ICD-10-CM | POA: Diagnosis not present

## 2021-09-08 DIAGNOSIS — K219 Gastro-esophageal reflux disease without esophagitis: Secondary | ICD-10-CM | POA: Diagnosis not present

## 2021-09-08 DIAGNOSIS — Z950 Presence of cardiac pacemaker: Secondary | ICD-10-CM | POA: Diagnosis not present

## 2021-09-08 DIAGNOSIS — J9610 Chronic respiratory failure, unspecified whether with hypoxia or hypercapnia: Secondary | ICD-10-CM | POA: Diagnosis not present

## 2021-09-08 DIAGNOSIS — Z96642 Presence of left artificial hip joint: Secondary | ICD-10-CM | POA: Diagnosis not present

## 2021-09-08 DIAGNOSIS — G9009 Other idiopathic peripheral autonomic neuropathy: Secondary | ICD-10-CM | POA: Diagnosis not present

## 2021-09-08 DIAGNOSIS — D631 Anemia in chronic kidney disease: Secondary | ICD-10-CM | POA: Diagnosis not present

## 2021-09-08 DIAGNOSIS — N1831 Chronic kidney disease, stage 3a: Secondary | ICD-10-CM | POA: Diagnosis not present

## 2021-09-08 NOTE — Progress Notes (Signed)
Multiple attempts. Pt vm full. No answer.  Kathrin Ruddy, NP

## 2021-09-09 NOTE — Telephone Encounter (Signed)
Call pt She was discharged from hospital 08/11/21 so this would be a follow up appt   Please confirm she is no longer on aspirin 81mg   I have ordered cbc with diff to monitor anemia after left hip replacement . Please sch in the next week.  She is seeing Dr Roland Rack, orthopedics 09/16/21 and me 09/21/21 She sees neurology 09/25/21  If patient has acute concerns, let me know otherwise we can keep 09/21/21 appt

## 2021-09-09 NOTE — Telephone Encounter (Signed)
I tried to LM to call & VM is full. Will try back.

## 2021-09-09 NOTE — Addendum Note (Signed)
Addended by: Burnard Hawthorne on: 09/09/2021 10:15 AM   Modules accepted: Orders

## 2021-09-12 DIAGNOSIS — J8410 Pulmonary fibrosis, unspecified: Secondary | ICD-10-CM | POA: Diagnosis not present

## 2021-09-14 DIAGNOSIS — M6281 Muscle weakness (generalized): Secondary | ICD-10-CM | POA: Diagnosis not present

## 2021-09-15 DIAGNOSIS — J9610 Chronic respiratory failure, unspecified whether with hypoxia or hypercapnia: Secondary | ICD-10-CM | POA: Diagnosis not present

## 2021-09-15 DIAGNOSIS — D631 Anemia in chronic kidney disease: Secondary | ICD-10-CM | POA: Diagnosis not present

## 2021-09-15 DIAGNOSIS — Z7902 Long term (current) use of antithrombotics/antiplatelets: Secondary | ICD-10-CM | POA: Diagnosis not present

## 2021-09-15 DIAGNOSIS — H5789 Other specified disorders of eye and adnexa: Secondary | ICD-10-CM | POA: Diagnosis not present

## 2021-09-15 DIAGNOSIS — G9009 Other idiopathic peripheral autonomic neuropathy: Secondary | ICD-10-CM | POA: Diagnosis not present

## 2021-09-15 DIAGNOSIS — J8410 Pulmonary fibrosis, unspecified: Secondary | ICD-10-CM | POA: Diagnosis not present

## 2021-09-15 DIAGNOSIS — E785 Hyperlipidemia, unspecified: Secondary | ICD-10-CM | POA: Diagnosis not present

## 2021-09-15 DIAGNOSIS — Z7982 Long term (current) use of aspirin: Secondary | ICD-10-CM | POA: Diagnosis not present

## 2021-09-15 DIAGNOSIS — Z9181 History of falling: Secondary | ICD-10-CM | POA: Diagnosis not present

## 2021-09-15 DIAGNOSIS — S72092D Other fracture of head and neck of left femur, subsequent encounter for closed fracture with routine healing: Secondary | ICD-10-CM | POA: Diagnosis not present

## 2021-09-15 DIAGNOSIS — Z950 Presence of cardiac pacemaker: Secondary | ICD-10-CM | POA: Diagnosis not present

## 2021-09-15 DIAGNOSIS — I951 Orthostatic hypotension: Secondary | ICD-10-CM | POA: Diagnosis not present

## 2021-09-15 DIAGNOSIS — Z96642 Presence of left artificial hip joint: Secondary | ICD-10-CM | POA: Diagnosis not present

## 2021-09-15 DIAGNOSIS — N1831 Chronic kidney disease, stage 3a: Secondary | ICD-10-CM | POA: Diagnosis not present

## 2021-09-15 DIAGNOSIS — Z8673 Personal history of transient ischemic attack (TIA), and cerebral infarction without residual deficits: Secondary | ICD-10-CM | POA: Diagnosis not present

## 2021-09-15 DIAGNOSIS — Z9981 Dependence on supplemental oxygen: Secondary | ICD-10-CM | POA: Diagnosis not present

## 2021-09-15 DIAGNOSIS — K219 Gastro-esophageal reflux disease without esophagitis: Secondary | ICD-10-CM | POA: Diagnosis not present

## 2021-09-15 NOTE — Telephone Encounter (Signed)
I called [facility] bc patient did not know if she was taking & aspirin 81 mg was stili in pill pack at bedtime. I told them to d/c. I wanted to make sure that was 100% accurate correct?   Also patient is scheduled to have labs draw here Friday at 2p. I spoke with [name] who is a lady she now has sitting with her during the day & she has been keeping up with patient's appointments. Pt did not express any acute concerns at this time.

## 2021-09-15 NOTE — Telephone Encounter (Signed)
Thank you for calling [facility]  Yes you are right to DISCONTINUE asa 81mg   Per note from last month:   I consulted with cardiology, Dr. Caryl Comes and also Dr. Manuella Ghazi.  We all agreed in the setting of anemia, falls, she should stop aspirin 81 mg.  She may be maintained on Plavix 75mg  qd alone due to her history of stroke  I will see as planned without acute concerns

## 2021-09-16 DIAGNOSIS — Z8673 Personal history of transient ischemic attack (TIA), and cerebral infarction without residual deficits: Secondary | ICD-10-CM | POA: Diagnosis not present

## 2021-09-16 DIAGNOSIS — E785 Hyperlipidemia, unspecified: Secondary | ICD-10-CM | POA: Diagnosis not present

## 2021-09-16 DIAGNOSIS — Z96642 Presence of left artificial hip joint: Secondary | ICD-10-CM | POA: Diagnosis not present

## 2021-09-16 DIAGNOSIS — D631 Anemia in chronic kidney disease: Secondary | ICD-10-CM | POA: Diagnosis not present

## 2021-09-16 DIAGNOSIS — J9610 Chronic respiratory failure, unspecified whether with hypoxia or hypercapnia: Secondary | ICD-10-CM | POA: Diagnosis not present

## 2021-09-16 DIAGNOSIS — K219 Gastro-esophageal reflux disease without esophagitis: Secondary | ICD-10-CM | POA: Diagnosis not present

## 2021-09-16 DIAGNOSIS — J8410 Pulmonary fibrosis, unspecified: Secondary | ICD-10-CM | POA: Diagnosis not present

## 2021-09-16 DIAGNOSIS — Z950 Presence of cardiac pacemaker: Secondary | ICD-10-CM | POA: Diagnosis not present

## 2021-09-16 DIAGNOSIS — H5789 Other specified disorders of eye and adnexa: Secondary | ICD-10-CM | POA: Diagnosis not present

## 2021-09-16 DIAGNOSIS — I951 Orthostatic hypotension: Secondary | ICD-10-CM | POA: Diagnosis not present

## 2021-09-16 DIAGNOSIS — Z9181 History of falling: Secondary | ICD-10-CM | POA: Diagnosis not present

## 2021-09-16 DIAGNOSIS — Z7982 Long term (current) use of aspirin: Secondary | ICD-10-CM | POA: Diagnosis not present

## 2021-09-16 DIAGNOSIS — S72092D Other fracture of head and neck of left femur, subsequent encounter for closed fracture with routine healing: Secondary | ICD-10-CM | POA: Diagnosis not present

## 2021-09-16 DIAGNOSIS — Z9981 Dependence on supplemental oxygen: Secondary | ICD-10-CM | POA: Diagnosis not present

## 2021-09-16 DIAGNOSIS — Z7902 Long term (current) use of antithrombotics/antiplatelets: Secondary | ICD-10-CM | POA: Diagnosis not present

## 2021-09-16 DIAGNOSIS — N1831 Chronic kidney disease, stage 3a: Secondary | ICD-10-CM | POA: Diagnosis not present

## 2021-09-16 DIAGNOSIS — G9009 Other idiopathic peripheral autonomic neuropathy: Secondary | ICD-10-CM | POA: Diagnosis not present

## 2021-09-18 ENCOUNTER — Other Ambulatory Visit: Payer: Self-pay

## 2021-09-18 ENCOUNTER — Other Ambulatory Visit (INDEPENDENT_AMBULATORY_CARE_PROVIDER_SITE_OTHER): Payer: Medicare Other

## 2021-09-18 DIAGNOSIS — I10 Essential (primary) hypertension: Secondary | ICD-10-CM

## 2021-09-18 DIAGNOSIS — D649 Anemia, unspecified: Secondary | ICD-10-CM

## 2021-09-18 DIAGNOSIS — Z96642 Presence of left artificial hip joint: Secondary | ICD-10-CM | POA: Diagnosis not present

## 2021-09-18 DIAGNOSIS — M25551 Pain in right hip: Secondary | ICD-10-CM | POA: Diagnosis not present

## 2021-09-18 LAB — CBC WITH DIFFERENTIAL/PLATELET
Basophils Absolute: 0.1 10*3/uL (ref 0.0–0.1)
Basophils Relative: 1 % (ref 0.0–3.0)
Eosinophils Absolute: 0.5 10*3/uL (ref 0.0–0.7)
Eosinophils Relative: 6.3 % — ABNORMAL HIGH (ref 0.0–5.0)
HCT: 28.1 % — ABNORMAL LOW (ref 36.0–46.0)
Hemoglobin: 9.1 g/dL — ABNORMAL LOW (ref 12.0–15.0)
Lymphocytes Relative: 19.2 % (ref 12.0–46.0)
Lymphs Abs: 1.6 10*3/uL (ref 0.7–4.0)
MCHC: 32.3 g/dL (ref 30.0–36.0)
MCV: 84 fl (ref 78.0–100.0)
Monocytes Absolute: 0.6 10*3/uL (ref 0.1–1.0)
Monocytes Relative: 6.9 % (ref 3.0–12.0)
Neutro Abs: 5.4 10*3/uL (ref 1.4–7.7)
Neutrophils Relative %: 66.6 % (ref 43.0–77.0)
Platelets: 301 10*3/uL (ref 150.0–400.0)
RBC: 3.35 Mil/uL — ABNORMAL LOW (ref 3.87–5.11)
RDW: 17.6 % — ABNORMAL HIGH (ref 11.5–15.5)
WBC: 8.1 10*3/uL (ref 4.0–10.5)

## 2021-09-18 LAB — BASIC METABOLIC PANEL
BUN: 14 mg/dL (ref 6–23)
CO2: 29 mEq/L (ref 19–32)
Calcium: 9.1 mg/dL (ref 8.4–10.5)
Chloride: 100 mEq/L (ref 96–112)
Creatinine, Ser: 0.9 mg/dL (ref 0.40–1.20)
GFR: 60.03 mL/min (ref >60.00)
Glucose, Bld: 111 mg/dL — ABNORMAL HIGH (ref 70–99)
Potassium: 4.1 mEq/L (ref 3.5–5.1)
Sodium: 138 mEq/L (ref 135–145)

## 2021-09-18 LAB — COMPREHENSIVE METABOLIC PANEL
ALT: 12 U/L (ref 0–35)
AST: 16 U/L (ref 0–37)
Albumin: 3.7 g/dL (ref 3.5–5.2)
Alkaline Phosphatase: 77 U/L (ref 39–117)
BUN: 14 mg/dL (ref 6–23)
CO2: 29 mEq/L (ref 19–32)
Calcium: 9.1 mg/dL (ref 8.4–10.5)
Chloride: 100 mEq/L (ref 96–112)
Creatinine, Ser: 0.9 mg/dL (ref 0.40–1.20)
GFR: 60.03 mL/min (ref >60.00)
Glucose, Bld: 111 mg/dL — ABNORMAL HIGH (ref 70–99)
Potassium: 4.1 mEq/L (ref 3.5–5.1)
Sodium: 138 mEq/L (ref 135–145)
Total Bilirubin: 0.5 mg/dL (ref 0.2–1.2)
Total Protein: 6.9 g/dL (ref 6.0–8.3)

## 2021-09-18 LAB — IBC + FERRITIN
Ferritin: 155.9 ng/mL (ref 10.0–291.0)
Iron: 34 ug/dL — ABNORMAL LOW (ref 42–145)
Saturation Ratios: 13.6 % — ABNORMAL LOW (ref 20.0–50.0)
TIBC: 250.6 ug/dL (ref 250.0–450.0)
Transferrin: 179 mg/dL — ABNORMAL LOW (ref 212.0–360.0)

## 2021-09-21 ENCOUNTER — Telehealth: Payer: Self-pay | Admitting: Family

## 2021-09-21 ENCOUNTER — Other Ambulatory Visit: Payer: Self-pay

## 2021-09-21 ENCOUNTER — Ambulatory Visit (INDEPENDENT_AMBULATORY_CARE_PROVIDER_SITE_OTHER): Payer: Medicare Other | Admitting: Family

## 2021-09-21 ENCOUNTER — Encounter: Payer: Self-pay | Admitting: Family

## 2021-09-21 ENCOUNTER — Ambulatory Visit
Admission: RE | Admit: 2021-09-21 | Discharge: 2021-09-21 | Disposition: A | Discharge status: Home / Self Care | Payer: Medicare Other | Source: Ambulatory Visit | Attending: Student | Admitting: Student

## 2021-09-21 ENCOUNTER — Other Ambulatory Visit: Payer: Self-pay | Admitting: Student

## 2021-09-21 ENCOUNTER — Ambulatory Visit: Admission: RE | Admit: 2021-09-21 | Payer: Medicare Other | Source: Ambulatory Visit | Admitting: *Deleted

## 2021-09-21 VITALS — BP 130/62 | HR 83 | Temp 98.0°F | Ht 64.0 in | Wt 163.0 lb

## 2021-09-21 DIAGNOSIS — D649 Anemia, unspecified: Secondary | ICD-10-CM

## 2021-09-21 DIAGNOSIS — Z96642 Presence of left artificial hip joint: Secondary | ICD-10-CM

## 2021-09-21 DIAGNOSIS — Z23 Encounter for immunization: Secondary | ICD-10-CM | POA: Diagnosis not present

## 2021-09-21 DIAGNOSIS — I639 Cerebral infarction, unspecified: Secondary | ICD-10-CM

## 2021-09-21 DIAGNOSIS — J8410 Pulmonary fibrosis, unspecified: Secondary | ICD-10-CM

## 2021-09-21 DIAGNOSIS — R059 Cough, unspecified: Secondary | ICD-10-CM

## 2021-09-21 DIAGNOSIS — J9801 Acute bronchospasm: Secondary | ICD-10-CM

## 2021-09-21 DIAGNOSIS — I951 Orthostatic hypotension: Secondary | ICD-10-CM

## 2021-09-21 DIAGNOSIS — Z8673 Personal history of transient ischemic attack (TIA), and cerebral infarction without residual deficits: Secondary | ICD-10-CM | POA: Diagnosis not present

## 2021-09-21 DIAGNOSIS — M2170 Unequal limb length (acquired), unspecified site: Secondary | ICD-10-CM

## 2021-09-21 MED ORDER — ALBUTEROL SULFATE HFA 108 (90 BASE) MCG/ACT IN AERS: INHALATION_SPRAY | RESPIRATORY_TRACT | 2 refills | Status: DC

## 2021-09-21 MED ORDER — FLUTICASONE PROPIONATE 50 MCG/ACT NA SUSP: NASAL | 1 refills | Status: DC

## 2021-09-21 MED ORDER — ALBUTEROL SULFATE HFA 108 (90 BASE) MCG/ACT IN AERS
INHALATION_SPRAY | RESPIRATORY_TRACT | 2 refills | Status: DC
Start: 2021-09-21 — End: 2021-09-21

## 2021-09-21 NOTE — Assessment & Plan Note (Addendum)
Appears resolved.  She is on midodrine 10 mg TID  as well as amlodipine 5 mg.  This seems unnecessary and confirming with Dr. Caryl Comes we can do a trial stop of both and monitor blood pressure. I have sent him telephone note.   It may be that she requires amlodipine 2.5 mg with close vigilance of BP.

## 2021-09-21 NOTE — Assessment & Plan Note (Signed)
Reviewed hospitalization course with patient.  Medications reconciled.  She is continue to follow with [facility] for ongoing, now right hip pain.  Declines further evaluation for me today.  She is going for an x-ray today of the right hip at [facility].  She Clines further physical exam in the office that she sitting comfortably in her wheelchair.  Hospital course significant for orthostasis, anemia.

## 2021-09-21 NOTE — Assessment & Plan Note (Signed)
She is no longer on 81 mg aspirin.  Compliant with Plavix.  She will continue Lipitor 80 mg.  Follow-up with Dr. Manuella Ghazi this Friday.

## 2021-09-21 NOTE — Patient Instructions (Signed)
Continue iron once daily.  We will recheck blood counts in 3 months time

## 2021-09-21 NOTE — Progress Notes (Signed)
Subjective:    Patient ID: Melody Ochoa, female    DOB: 04/25/1941, 80 y.o.   MRN: [id number]  CC: RENALDA LOCKLIN is a 80 y.o. female who presents today for follow up.   HPI: Friend, mother in law , Melody Ochoa, is Health care POA and accompanies her today.  She was [facility] for rehab and now home.  Overall she feels well today. No fatigue, SOB   She reports that she right hip pain , much improved today. 'I can move easier.' She was seen by [facility] 09/18/21 ( unable to see notes). She is getting xray at [facility] today. Taking prednisone currently.  No trouble urinating, saddle anesthesia, leg numbness. No falls since August 2022.   Husband is living in memory care unit.  She has 24 hour care with cna. Medication is being delivered with pill pack with [facility]  Using wheelchair   Lightheadedness has improved.  This very rarely occurs.  No cp, palpitations, syncope.   Anemia-she remains compliant with ferrous sulfate once per day.  NO blood in stool or urine.  Compliant with Colace 100 mg twice daily  she is compliant with Lyrica 50 mg in the morning and 100 mg at bedtime.  She remains compliant with Cymbalta 30 mg.  History of pulmonary fibrosis, chronic respiratory failure with hypoxia.  She is not wearing oxygen today as she typically does not at doctor appointment. She denies any shortness of breath, cough.  Her breathing is at baseline.  She is compliant with her oxygen 24/7 at home  She is following with Dr. Caryl Comes, cardiology.  Last seen 08/18/2021.  Due to low blood pressure, he began Florinef 0.1 mg twice daily  Patient admitted to hospital 07/30/2021 and discharged on/05/2021.  Patient diagnoses include weakness, fall, left hip pain.  Hospital course significant for left hip replacement 08/05/2021 by Dr. Roland Rack.  Orthostatic hypotension.  She is no longer on Norvasc.  Provided midodrine as she has  10mg  TID  Postoperative acute blood loss anemia transfused 1  package of red blood cells at discharge hemoglobin 8.4.  CT head negative for acute findings, showed chronic cortical-based infarcts, stable chronic small vessel ischemic changes, small chronic infarcts, atrophy.  Restarted Plavix with history of stroke.  Acute hypoxic respiratory failure advised to continue oxygen 24/7 2 L.  Peripheral neuropathy and patient is compliant with Lyrica.   HISTORY:  Past Medical History:  Diagnosis Date   Arthritis    Atheromatous plaque 10/18/2017   Back pain    Cancer ([place])    skin   Depression    Diverticulosis    Dizziness    patient had episode of dizziness when came in room. hx past no dx   GERD (gastroesophageal reflux disease)    Heart murmur    Hyperlipidemia 10/18/2017   PONV (postoperative nausea and vomiting)    yrs ago   Squamous cell carcinoma of skin 11/16/2016   L chest parasternal   Squamous cell carcinoma of skin 02/05/2020   L cheek inf to lat zygoma   Stroke [facility])    Past Surgical History:  Procedure Laterality Date   BACK SURGERY  13   BREAST BIOPSY Bilateral    cores "years ago"   BREAST EXCISIONAL BIOPSY Left 90's   CATARACT EXTRACTION W/PHACO Left 12/20/2018   Procedure: CATARACT EXTRACTION PHACO AND INTRAOCULAR LENS PLACEMENT ([place]) LEFT TOPICAL;  Surgeon: Leandrew Koyanagi, MD;  Location: [place];  Service: Ophthalmology;  Laterality:  Left;   CATARACT EXTRACTION W/PHACO Right 01/24/2019   Procedure: CATARACT EXTRACTION PHACO AND INTRAOCULAR LENS PLACEMENT ([place]) RIGHT;  Surgeon: Leandrew Koyanagi, MD;  Location: [place];  Service: Ophthalmology;  Laterality: Right;   CERVICAL DISC SURGERY  09   COLONOSCOPY WITH PROPOFOL N/A 01/13/2016   Procedure: COLONOSCOPY WITH PROPOFOL;  Surgeon: [name], MD;  Location: [facility] ENDOSCOPY;  Service: Endoscopy;  Laterality: N/A;   COLONOSCOPY WITH PROPOFOL N/A 02/19/2020   Procedure: COLONOSCOPY WITH PROPOFOL;  Surgeon: [name], MD;  Location: [facility]  ENDOSCOPY;  Service: Gastroenterology;  Laterality: N/A;   FACELIFT  94   LOOP RECORDER INSERTION N/A 08/23/2017   Procedure: LOOP RECORDER INSERTION;  Surgeon: Thompson Grayer, MD;  Location: [place] CV LAB;  Service: Cardiovascular;  Laterality: N/A;   TEE WITHOUT CARDIOVERSION N/A 08/12/2017   Procedure: TRANSESOPHAGEAL ECHOCARDIOGRAM (TEE);  Surgeon: Sueanne Margarita, MD;  Location: [facility] ENDOSCOPY;  Service: Cardiovascular;  Laterality: N/A;   TONSILLECTOMY     TOTAL HIP ARTHROPLASTY Right 04/22/2016   Procedure: TOTAL HIP ARTHROPLASTY;  Surgeon: Corky Mull, MD;  Location: [facility] ORS;  Service: Orthopedics;  Laterality: Right;   TOTAL HIP ARTHROPLASTY Left 08/05/2021   Procedure: TOTAL HIP ARTHROPLASTY;  Surgeon: Corky Mull, MD;  Location: [facility] ORS;  Service: Orthopedics;  Laterality: Left;   Family History  Problem Relation Age of Onset   Lung cancer Father    Aneurysm Brother    Stroke Brother    Diabetes Maternal Grandmother    Kidney disease Maternal Grandfather    Cushing syndrome Paternal Grandmother    Dementia Paternal Grandfather    Breast cancer Maternal Aunt 52    Allergies: Latex, Aleve [naproxen sodium], Aspirin, Celebrex [celecoxib], Effexor [venlafaxine], Gabapentin, Hydrocodone bit-homatrop mbr, Ibuprofen, Mobic [meloxicam], Tape, Vioxx [rofecoxib], Other, and Tramadol Current Outpatient Medications on File Prior to Visit  Medication Sig Dispense Refill   acetaminophen (TYLENOL) 500 MG tablet Take 1-2 tablets (500-1,000 mg total) by mouth every 6 (six) hours as needed. 60 tablet 0   amLODipine (NORVASC) 5 MG tablet Take 1 tablet by mouth daily.     atorvastatin (LIPITOR) 80 MG tablet Take 1 tablet (80 mg total) by mouth daily. 90 tablet 0   buPROPion (WELLBUTRIN XL) 300 MG 24 hr tablet TAKE ONE TABLET BY MOUTH EVERY DAY 90 tablet 1   clopidogrel (PLAVIX) 75 MG tablet Take 1 tablet (75 mg total) by mouth daily. 90 tablet 3   docusate sodium (COLACE) 100 MG capsule Take  1 capsule (100 mg total) by mouth 2 (two) times daily. 60 capsule 0   DULoxetine (CYMBALTA) 30 MG capsule TAKE 1 CAPSULE BY MOUTH EVERY DAY 90 capsule 1   famotidine (PEPCID) 20 MG tablet TAKE ONE TABLET BY MOUTH AT BEDTIME 90 tablet 1   feeding supplement (ENSURE ENLIVE / ENSURE PLUS) LIQD Take 237 mLs by mouth 3 (three) times daily between meals. 14220 mL 0   menthol-cetylpyridinium (CEPACOL) 3 MG lozenge Take 1 lozenge (3 mg total) by mouth as needed for sore throat. 15 tablet 12   midodrine (PROAMATINE) 10 MG tablet Take 1 tablet (10 mg total) by mouth 3 (three) times daily with meals. 90 tablet 0   oxyCODONE (OXY IR/ROXICODONE) 5 MG immediate release tablet Take 0.5-1 tablets (2.5-5 mg total) by mouth every 4 (four) hours as needed for moderate pain (pain score 4-6). 60 tablet 0   predniSONE (DELTASONE) 10 MG tablet Take 5 tabs on days 1,2. Take 4 tabs  on days 3,4. Take 3 tabs days 5,6. Take 2 tabs days 7,8. Take 1 tabs days 9,10.     pregabalin (LYRICA) 100 MG capsule Take 100 mg by mouth daily.     pregabalin (LYRICA) 50 MG capsule TAKE 1 CAPSULE (50 MG) BY MOUTH EVERY DAY AND TAKE 2 CAPSULES (100 MG) AT BEDTIME. Max daily dose 150mg /day. 90 capsule 1   ferrous sulfate 325 (65 FE) MG tablet Take 1 tablet (325 mg total) by mouth daily. 30 tablet 0   No current facility-administered medications on file prior to visit.    Social History   Tobacco Use   Smoking status: Former    Packs/day: 0.75    Years: 40.00    Pack years: 30.00    Types: Cigarettes    Quit date: 11/13/1996    Years since quitting: 24.8   Smokeless tobacco: Never  Vaping Use   Vaping Use: Never used  Substance Use Topics   Alcohol use: Yes    Comment: occ wine   Drug use: No    Review of Systems  Constitutional:  Negative for chills, fatigue and fever.  Respiratory:  Negative for cough and shortness of breath.   Cardiovascular:  Negative for chest pain, palpitations and leg swelling.  Gastrointestinal:   Negative for nausea and vomiting.  Musculoskeletal:  Positive for back pain.  Neurological:  Negative for dizziness and light-headedness.     Objective:    BP 130/62 (BP Location: Left Arm, Patient Position: Sitting, Cuff Size: Normal)   Pulse 83   Temp 98 F (36.7 C) (Oral)   Ht 5\' 4"  (1.626 m)   Wt 163 lb (73.9 kg)   SpO2 92%   BMI 27.98 kg/m  BP Readings from Last 3 Encounters:  09/21/21 130/62  08/18/21 (!) 84/46  08/11/21 121/61   Wt Readings from Last 3 Encounters:  09/21/21 163 lb (73.9 kg)  08/18/21 171 lb (77.6 kg)  08/06/21 181 lb 10.5 oz (82.4 kg)    Physical Exam Vitals reviewed.  Constitutional:      Appearance: She is well-developed.  Eyes:     Conjunctiva/sclera: Conjunctivae normal.  Cardiovascular:     Rate and Rhythm: Normal rate and regular rhythm.     Pulses: Normal pulses.     Heart sounds: Normal heart sounds.  Pulmonary:     Effort: Pulmonary effort is normal.     Breath sounds: Normal breath sounds. No wheezing, rhonchi or rales.  Skin:    General: Skin is warm and dry.  Neurological:     Mental Status: She is alert.  Psychiatric:        Speech: Speech normal.        Behavior: Behavior normal.        Thought Content: Thought content normal.       Assessment & Plan:   Problem List Items Addressed This Visit       Cardiovascular and Mediastinum   Hypotension    Appears resolved.  She is on midodrine 10 mg TID  as well as amlodipine 5 mg.  This seems unnecessary and confirming with Dr. Caryl Comes we can do a trial stop of both and monitor blood pressure. I have sent him telephone note.   It may be that she requires amlodipine 2.5 mg with close vigilance of BP.      Relevant Medications   amLODipine (NORVASC) 5 MG tablet   Ischemic stroke ([place])    Confirmed with patient and [facility] that she is no longer on  dual platelet therapy.  Consulted with Dr. Caryl Comes, Dr. Manuella Ghazi regarding and we agreed over secure char.  Patient  should be on Plavix 75 mg QD.  She is no longer on aspirin 81 mg      Relevant Medications   amLODipine (NORVASC) 5 MG tablet     Respiratory   Pulmonary fibrosis ([facility])    Chronic stable.  No acute respiratory distress.  Refilled albuterol inhaler for her today.  Advised to continue 2 L oxygen at home 24/7      Relevant Medications   albuterol (VENTOLIN HFA) 108 (90 Base) MCG/ACT inhaler     Other   Anemia    Asymptomatic.  She is tolerating ferrous sulfate 325 taken once daily.  She is also on Colace 100 mg twice daily.  Hemoglobin improved to 9.1.  Advised to repeat iron stores, CBC in 3 months time at follow-up      History of CVA (cerebrovascular accident)    She is no longer on 81 mg aspirin.  Compliant with Plavix.  She will continue Lipitor 80 mg.  Follow-up with Dr. Manuella Ghazi this Friday.      Status post total hip replacement, left    Reviewed hospitalization course with patient.  Medications reconciled.  She is continue to follow with [facility] for ongoing, now right hip pain.  Declines further evaluation for me today.  She is going for an x-ray today of the right hip at [facility].  She Clines further physical exam in the office that she sitting comfortably in her wheelchair.  Hospital course significant for orthostasis, anemia.      Other Visit Diagnoses     Need for immunization against influenza    -  Primary   Relevant Orders   Flu Vaccine QUAD High Dose(Fluad) (Completed)   Bronchospasm       Trial of Breztri 2 puffs twice a day Albuterol as needed Cannot exclude underlying COPD   Relevant Medications   albuterol (VENTOLIN HFA) 108 (90 Base) MCG/ACT inhaler        I have discontinued Cinthia Rodden. Dusek's enoxaparin and fludrocortisone. I am also having her maintain her clopidogrel, DULoxetine, buPROPion, famotidine, pregabalin, oxyCODONE, acetaminophen, docusate sodium, feeding supplement, menthol-cetylpyridinium, ferrous sulfate, midodrine, atorvastatin, pregabalin,  predniSONE, amLODipine, and albuterol.   Meds ordered this encounter  Medications   albuterol (VENTOLIN HFA) 108 (90 Base) MCG/ACT inhaler    Sig: TAKE 2 PUFFS BY MOUTH EVERY 6 HOURS AS NEEDED FOR WHEEZE OR SHORTNESS OF BREATH    Dispense:  8.5 each    Refill:  2    Order Specific Question:   Supervising Provider    Answer:   Crecencio Mc [[id number]]     Return precautions given.   Risks, benefits, and alternatives of the medications and treatment plan prescribed today were discussed, and patient expressed understanding.   Education regarding symptom management and diagnosis given to patient on AVS.  Continue to follow with Burnard Hawthorne, FNP for routine health maintenance.   [name] and I agreed with plan.   [name], FNP  I have spent 35 minutes with a patient including precharting, exam, reviewing medical records including hospitalization, coordinating care /medication reconciliation with [facility], reviewing labs, as well as cardiology, Dr. Caryl Comes,  discussion plan of care.

## 2021-09-21 NOTE — Assessment & Plan Note (Signed)
Chronic stable.  No acute respiratory distress.  Refilled albuterol inhaler for her today.  Advised to continue 2 L oxygen at home 24/7

## 2021-09-21 NOTE — Assessment & Plan Note (Signed)
Asymptomatic.  She is tolerating ferrous sulfate 325 taken once daily.  She is also on Colace 100 mg twice daily.  Hemoglobin improved to 9.1.  Advised to repeat iron stores, CBC in 3 months time at follow-up

## 2021-09-21 NOTE — Assessment & Plan Note (Signed)
Confirmed with patient and [facility] that she is no longer on  dual platelet therapy.  Consulted with Dr. Caryl Comes, Dr. Manuella Ghazi regarding and we agreed over secure char.  Patient should be on Plavix 75 mg QD.  She is no longer on aspirin 81 mg

## 2021-09-21 NOTE — Telephone Encounter (Signed)
Dr. Caryl Comes,  [facility] you are well. Seeing pt today.  Patient is no longer taking Florinef ( not sure who, when she stopped)  She is on midodrine and amlodipine however  I would suspect we need to hold both midodrine and amlodipine.  I can certainly restart amlodipine at 2.5 mg blood pressure would elevate  Let me know if okay

## 2021-09-22 DIAGNOSIS — Z950 Presence of cardiac pacemaker: Secondary | ICD-10-CM | POA: Diagnosis not present

## 2021-09-22 DIAGNOSIS — G9009 Other idiopathic peripheral autonomic neuropathy: Secondary | ICD-10-CM | POA: Diagnosis not present

## 2021-09-22 DIAGNOSIS — D631 Anemia in chronic kidney disease: Secondary | ICD-10-CM | POA: Diagnosis not present

## 2021-09-22 DIAGNOSIS — H5789 Other specified disorders of eye and adnexa: Secondary | ICD-10-CM | POA: Diagnosis not present

## 2021-09-22 DIAGNOSIS — Z7902 Long term (current) use of antithrombotics/antiplatelets: Secondary | ICD-10-CM | POA: Diagnosis not present

## 2021-09-22 DIAGNOSIS — N1831 Chronic kidney disease, stage 3a: Secondary | ICD-10-CM | POA: Diagnosis not present

## 2021-09-22 DIAGNOSIS — E785 Hyperlipidemia, unspecified: Secondary | ICD-10-CM | POA: Diagnosis not present

## 2021-09-22 DIAGNOSIS — Z9181 History of falling: Secondary | ICD-10-CM | POA: Diagnosis not present

## 2021-09-22 DIAGNOSIS — S72092D Other fracture of head and neck of left femur, subsequent encounter for closed fracture with routine healing: Secondary | ICD-10-CM | POA: Diagnosis not present

## 2021-09-22 DIAGNOSIS — Z9981 Dependence on supplemental oxygen: Secondary | ICD-10-CM | POA: Diagnosis not present

## 2021-09-22 DIAGNOSIS — K219 Gastro-esophageal reflux disease without esophagitis: Secondary | ICD-10-CM | POA: Diagnosis not present

## 2021-09-22 DIAGNOSIS — J9610 Chronic respiratory failure, unspecified whether with hypoxia or hypercapnia: Secondary | ICD-10-CM | POA: Diagnosis not present

## 2021-09-22 DIAGNOSIS — I951 Orthostatic hypotension: Secondary | ICD-10-CM | POA: Diagnosis not present

## 2021-09-22 DIAGNOSIS — J8410 Pulmonary fibrosis, unspecified: Secondary | ICD-10-CM | POA: Diagnosis not present

## 2021-09-22 DIAGNOSIS — Z96642 Presence of left artificial hip joint: Secondary | ICD-10-CM | POA: Diagnosis not present

## 2021-09-22 DIAGNOSIS — Z7982 Long term (current) use of aspirin: Secondary | ICD-10-CM | POA: Diagnosis not present

## 2021-09-22 DIAGNOSIS — Z8673 Personal history of transient ischemic attack (TIA), and cerebral infarction without residual deficits: Secondary | ICD-10-CM | POA: Diagnosis not present

## 2021-09-24 ENCOUNTER — Telehealth: Payer: Self-pay | Admitting: Family

## 2021-09-24 DIAGNOSIS — J8410 Pulmonary fibrosis, unspecified: Secondary | ICD-10-CM | POA: Diagnosis not present

## 2021-09-24 DIAGNOSIS — N1831 Chronic kidney disease, stage 3a: Secondary | ICD-10-CM | POA: Diagnosis not present

## 2021-09-24 DIAGNOSIS — Z7902 Long term (current) use of antithrombotics/antiplatelets: Secondary | ICD-10-CM | POA: Diagnosis not present

## 2021-09-24 DIAGNOSIS — J9610 Chronic respiratory failure, unspecified whether with hypoxia or hypercapnia: Secondary | ICD-10-CM | POA: Diagnosis not present

## 2021-09-24 DIAGNOSIS — I951 Orthostatic hypotension: Secondary | ICD-10-CM | POA: Diagnosis not present

## 2021-09-24 DIAGNOSIS — S72092D Other fracture of head and neck of left femur, subsequent encounter for closed fracture with routine healing: Secondary | ICD-10-CM | POA: Diagnosis not present

## 2021-09-24 DIAGNOSIS — K219 Gastro-esophageal reflux disease without esophagitis: Secondary | ICD-10-CM | POA: Diagnosis not present

## 2021-09-24 DIAGNOSIS — Z950 Presence of cardiac pacemaker: Secondary | ICD-10-CM | POA: Diagnosis not present

## 2021-09-24 DIAGNOSIS — D631 Anemia in chronic kidney disease: Secondary | ICD-10-CM | POA: Diagnosis not present

## 2021-09-24 DIAGNOSIS — Z7982 Long term (current) use of aspirin: Secondary | ICD-10-CM | POA: Diagnosis not present

## 2021-09-24 DIAGNOSIS — Z96642 Presence of left artificial hip joint: Secondary | ICD-10-CM | POA: Diagnosis not present

## 2021-09-24 DIAGNOSIS — E785 Hyperlipidemia, unspecified: Secondary | ICD-10-CM | POA: Diagnosis not present

## 2021-09-24 DIAGNOSIS — Z9181 History of falling: Secondary | ICD-10-CM | POA: Diagnosis not present

## 2021-09-24 DIAGNOSIS — H5789 Other specified disorders of eye and adnexa: Secondary | ICD-10-CM | POA: Diagnosis not present

## 2021-09-24 DIAGNOSIS — Z9981 Dependence on supplemental oxygen: Secondary | ICD-10-CM | POA: Diagnosis not present

## 2021-09-24 DIAGNOSIS — Z8673 Personal history of transient ischemic attack (TIA), and cerebral infarction without residual deficits: Secondary | ICD-10-CM | POA: Diagnosis not present

## 2021-09-24 DIAGNOSIS — G9009 Other idiopathic peripheral autonomic neuropathy: Secondary | ICD-10-CM | POA: Diagnosis not present

## 2021-09-24 NOTE — Telephone Encounter (Signed)
Noted. Will hold message until I receive faxed paperwork.

## 2021-09-24 NOTE — Telephone Encounter (Signed)
Seth Bake from [facility] is calling in to let [name] know that the patient had a fall yesterday with no injuries.She is also calling in to request occupational therapy for the patient to evaluated and treat her.She will fax over the orders for the patient.

## 2021-09-25 DIAGNOSIS — Z8673 Personal history of transient ischemic attack (TIA), and cerebral infarction without residual deficits: Secondary | ICD-10-CM | POA: Diagnosis not present

## 2021-09-25 DIAGNOSIS — R2689 Other abnormalities of gait and mobility: Secondary | ICD-10-CM | POA: Diagnosis not present

## 2021-09-26 IMAGING — CR DG RIBS 2V*L*
1 series · 4 of 4 positions shown · non-contrast
Comparison: None.

CLINICAL DATA: Pain following fall

EXAM:
BILATERAL RIBS: 4+ VIEWS

[Series 1: dg ribs bilateral · 0.14mm/px · 4 of 4 slices shown]
[im 1/4]
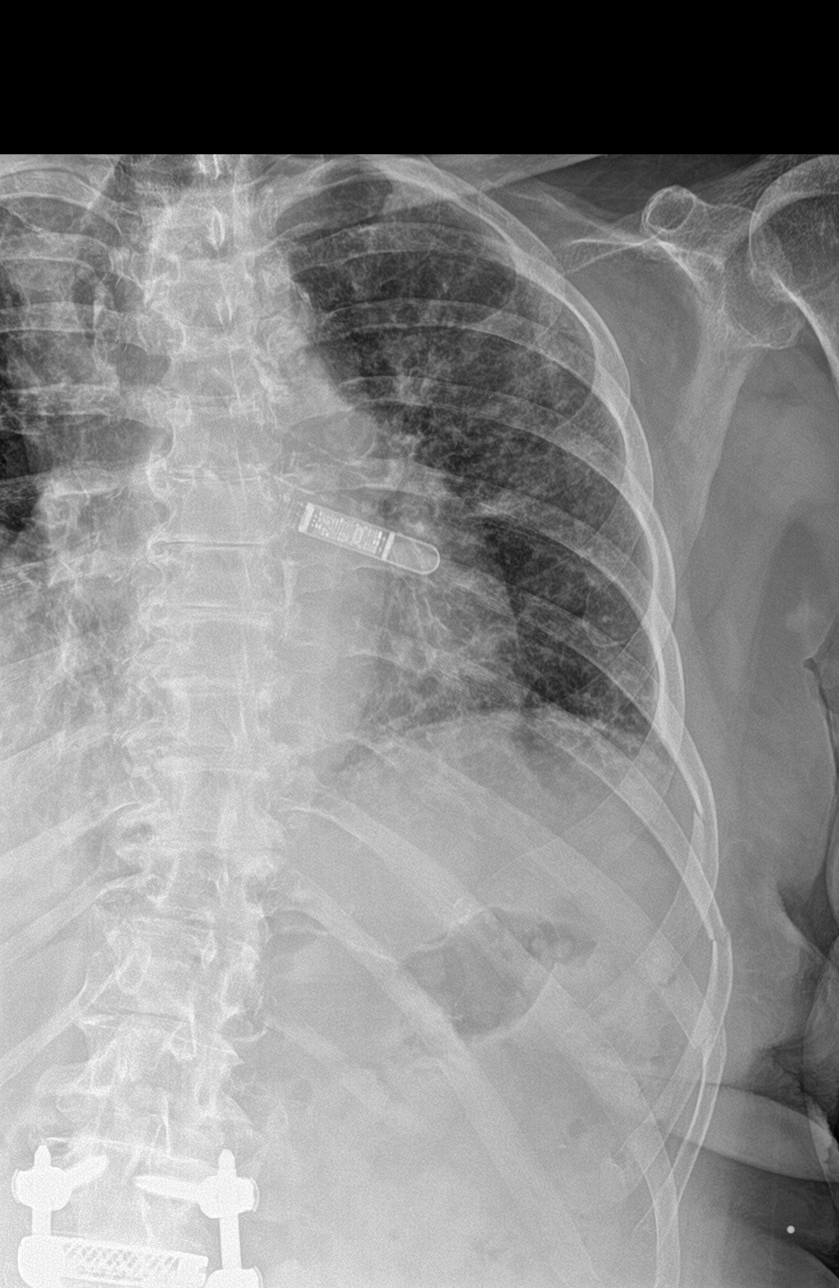
[im 2/4]
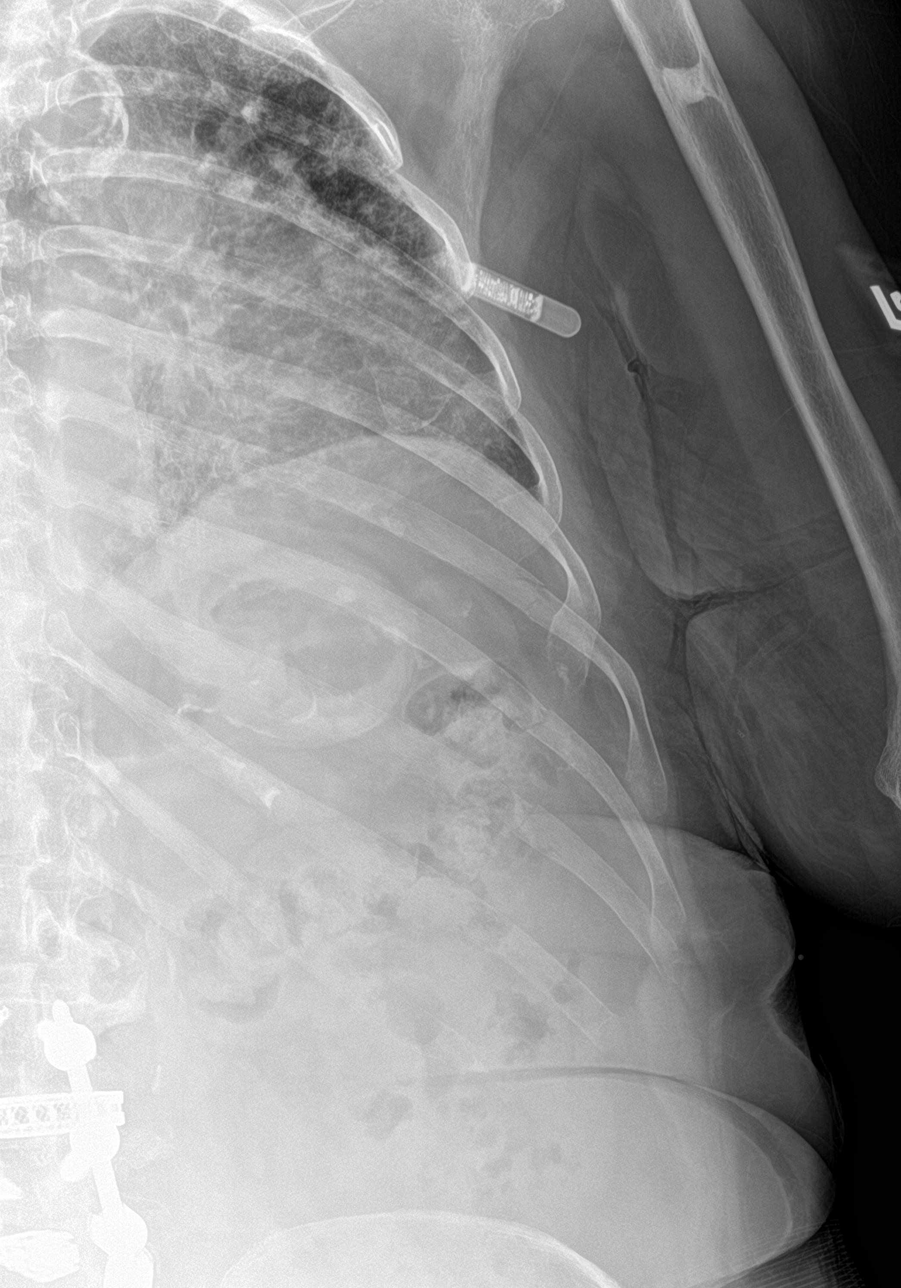
[im 3/4]
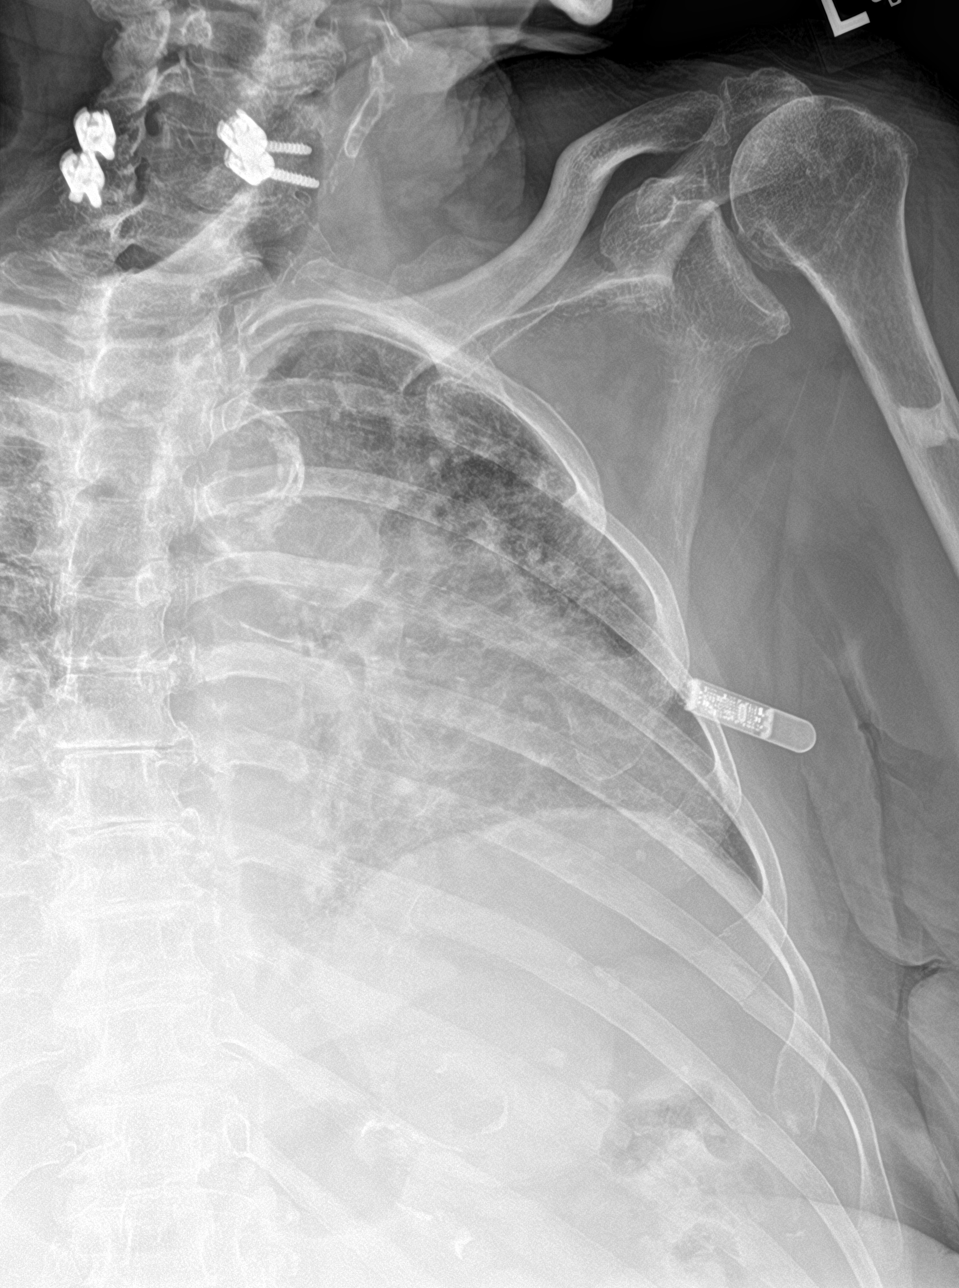
[im 4/4]
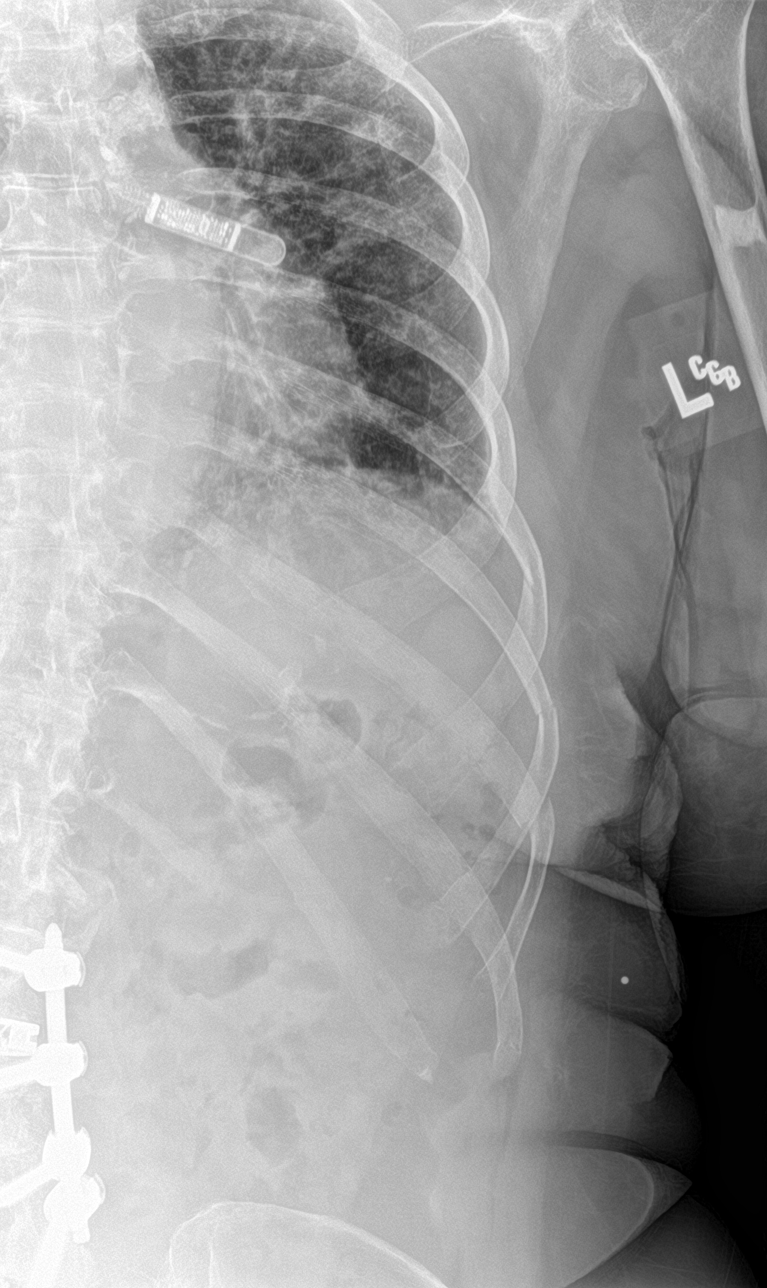

[4 of 4 positions shown; findings below may reference images not displayed]

FINDINGS: Frontal and oblique views of ribs bilaterally obtained. On the left,
there are slightly displaced fractures of the anterolateral left
seventh, eighth, and ninth ribs. No fracture is appreciable on the
right. There is underlying fibrotic change in the lungs. No edema or
airspace opacity. No pneumothorax or pleural effusion. Heart size
normal. Postoperative change noted in the lower cervical spine.
There is a loop recorder on the left.
IMPRESSION: Mildly displaced fractures of the anterolateral left seventh,
eighth, and ninth ribs. Underlying fibrosis in the lungs
bilaterally. No pneumothorax. No consolidation. Heart size normal.

These results will be called to the ordering clinician or
representative by the Radiologist Assistant, and communication
documented in the PACS or [REDACTED].

## 2021-09-26 IMAGING — CR DG RIBS 2V*R*
1 series · 4 of 4 positions shown · non-contrast
Comparison: None.

CLINICAL DATA: Pain following fall

EXAM:
BILATERAL RIBS: 4+ VIEWS

[Series 1: view not recorded · 0.14mm/px · 4 of 4 slices shown]
[im 1/4]
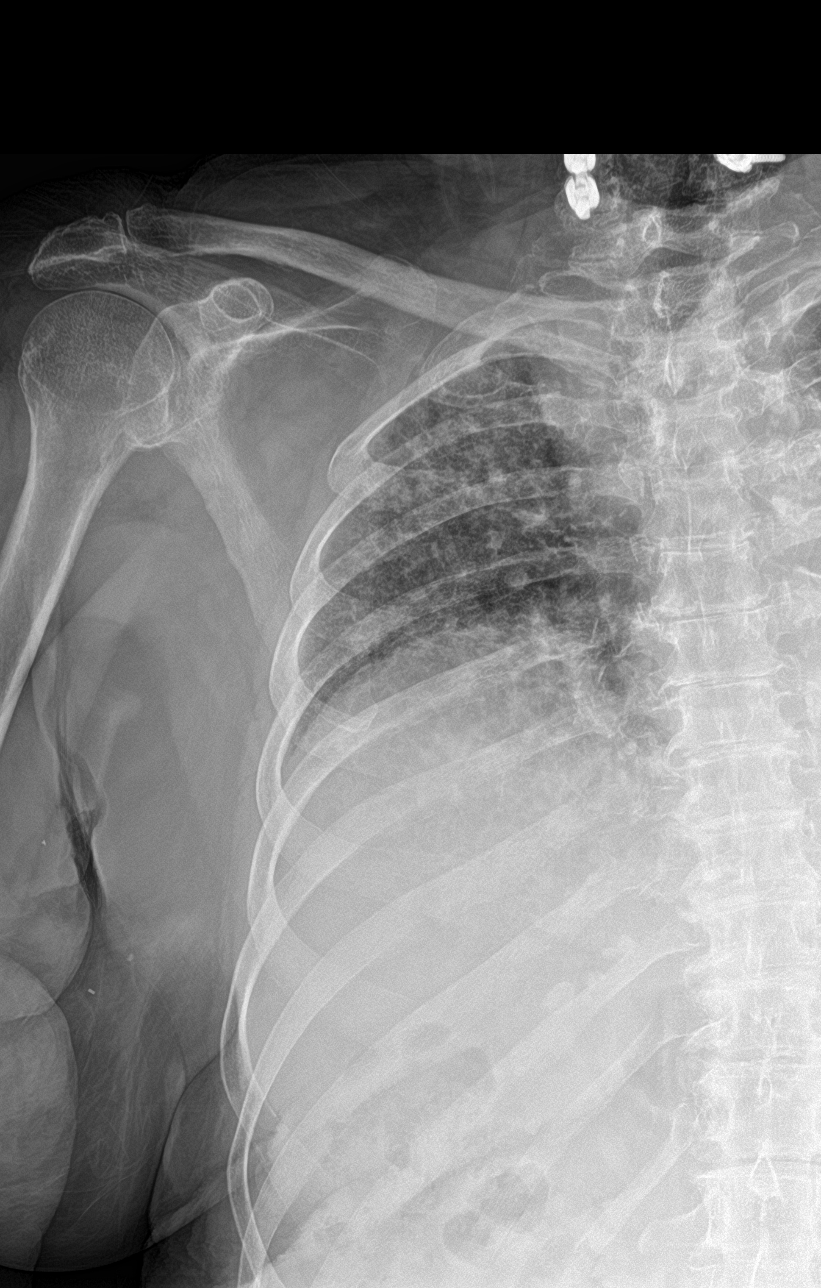
[im 2/4]
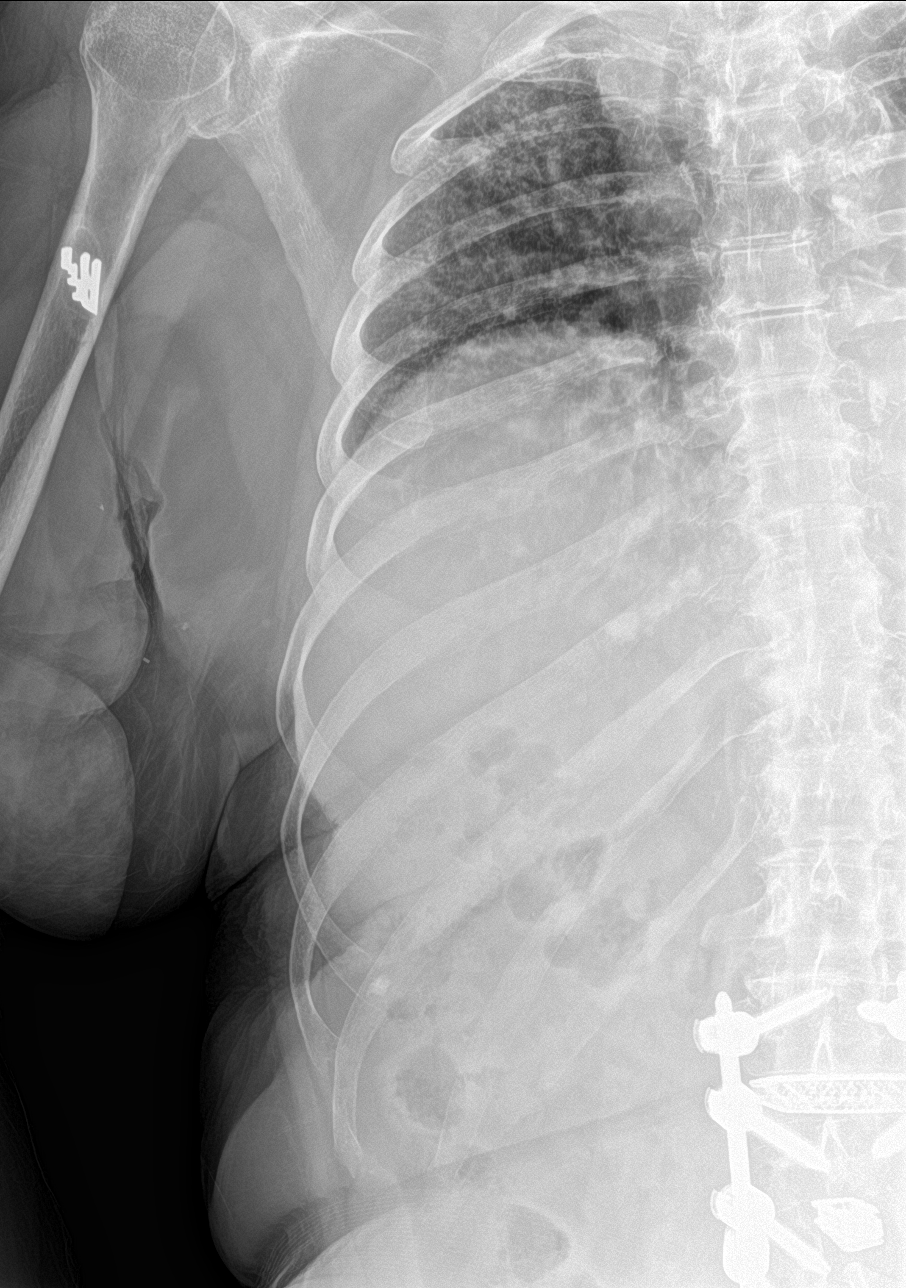
[im 3/4]
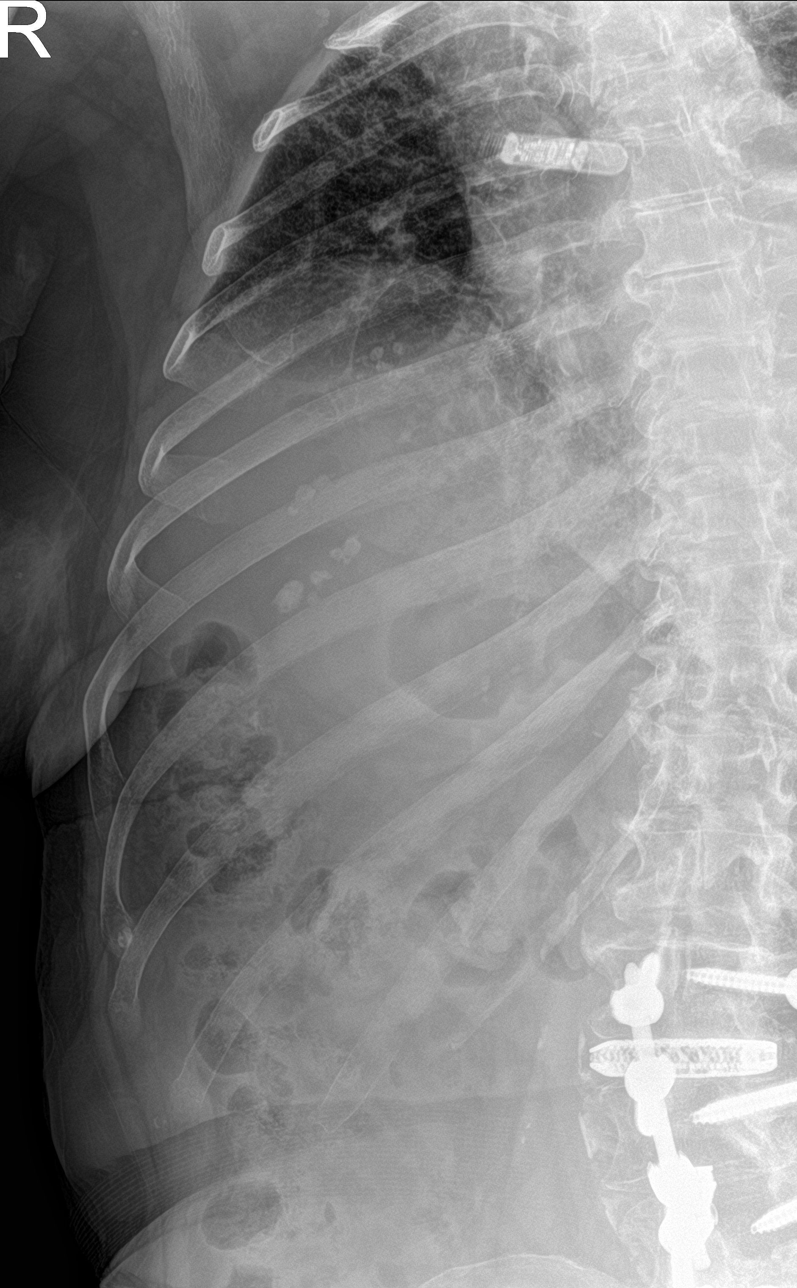
[im 4/4]
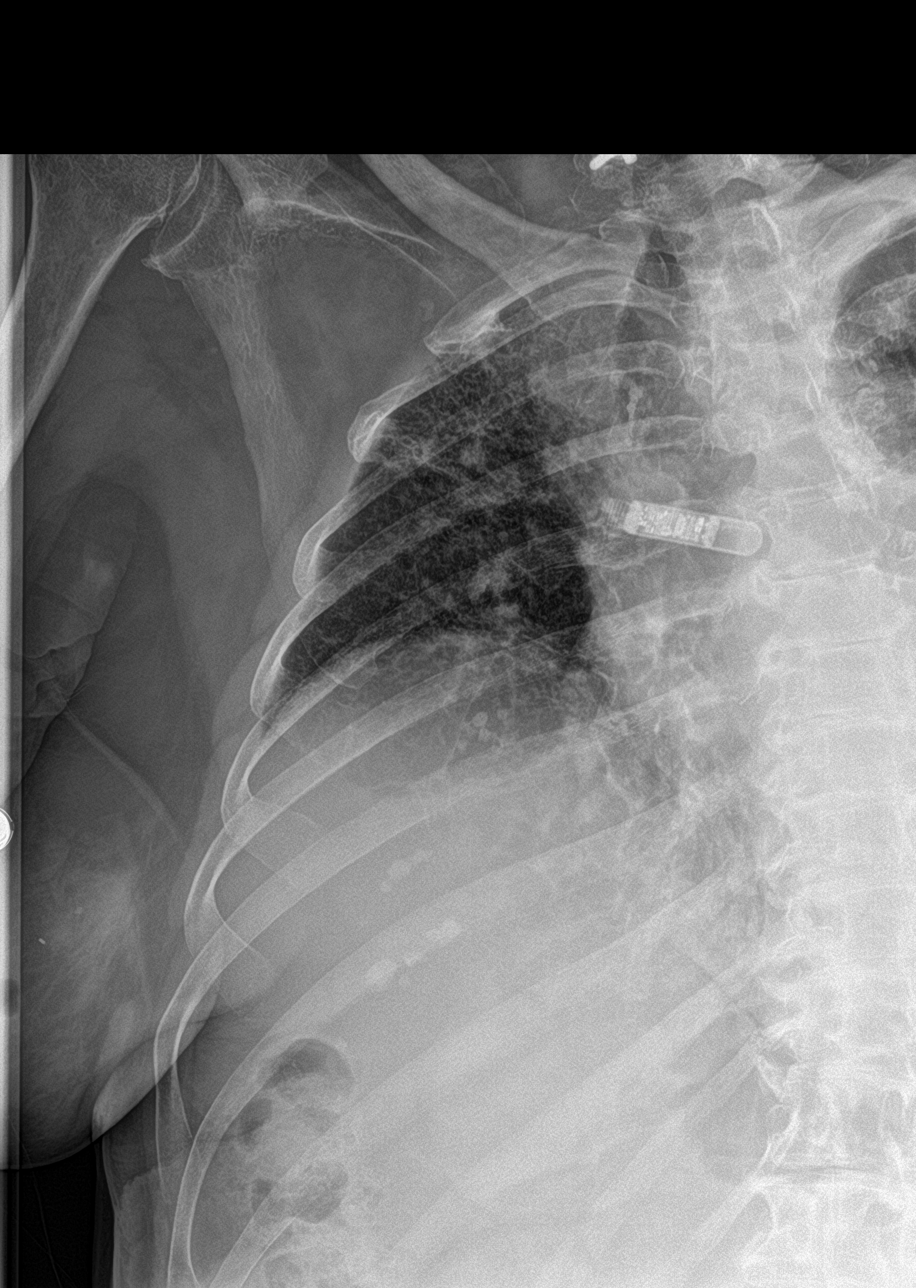

[4 of 4 positions shown; findings below may reference images not displayed]

FINDINGS: Frontal and oblique views of ribs bilaterally obtained. On the left,
there are slightly displaced fractures of the anterolateral left
seventh, eighth, and ninth ribs. No fracture is appreciable on the
right. There is underlying fibrotic change in the lungs. No edema or
airspace opacity. No pneumothorax or pleural effusion. Heart size
normal. Postoperative change noted in the lower cervical spine.
There is a loop recorder on the left.
IMPRESSION: Mildly displaced fractures of the anterolateral left seventh,
eighth, and ninth ribs. Underlying fibrosis in the lungs
bilaterally. No pneumothorax. No consolidation. Heart size normal.

These results will be called to the ordering clinician or
representative by the Radiologist Assistant, and communication
documented in the PACS or [REDACTED].

## 2021-09-29 ENCOUNTER — Telehealth: Payer: Self-pay | Admitting: Family

## 2021-09-29 DIAGNOSIS — H5789 Other specified disorders of eye and adnexa: Secondary | ICD-10-CM | POA: Diagnosis not present

## 2021-09-29 DIAGNOSIS — Z9981 Dependence on supplemental oxygen: Secondary | ICD-10-CM | POA: Diagnosis not present

## 2021-09-29 DIAGNOSIS — S72092D Other fracture of head and neck of left femur, subsequent encounter for closed fracture with routine healing: Secondary | ICD-10-CM | POA: Diagnosis not present

## 2021-09-29 DIAGNOSIS — N1831 Chronic kidney disease, stage 3a: Secondary | ICD-10-CM | POA: Diagnosis not present

## 2021-09-29 DIAGNOSIS — Z7982 Long term (current) use of aspirin: Secondary | ICD-10-CM | POA: Diagnosis not present

## 2021-09-29 DIAGNOSIS — J9610 Chronic respiratory failure, unspecified whether with hypoxia or hypercapnia: Secondary | ICD-10-CM | POA: Diagnosis not present

## 2021-09-29 DIAGNOSIS — Z8673 Personal history of transient ischemic attack (TIA), and cerebral infarction without residual deficits: Secondary | ICD-10-CM | POA: Diagnosis not present

## 2021-09-29 DIAGNOSIS — I951 Orthostatic hypotension: Secondary | ICD-10-CM | POA: Diagnosis not present

## 2021-09-29 DIAGNOSIS — Z7902 Long term (current) use of antithrombotics/antiplatelets: Secondary | ICD-10-CM | POA: Diagnosis not present

## 2021-09-29 DIAGNOSIS — D631 Anemia in chronic kidney disease: Secondary | ICD-10-CM | POA: Diagnosis not present

## 2021-09-29 DIAGNOSIS — G9009 Other idiopathic peripheral autonomic neuropathy: Secondary | ICD-10-CM | POA: Diagnosis not present

## 2021-09-29 DIAGNOSIS — J8410 Pulmonary fibrosis, unspecified: Secondary | ICD-10-CM | POA: Diagnosis not present

## 2021-09-29 DIAGNOSIS — E785 Hyperlipidemia, unspecified: Secondary | ICD-10-CM | POA: Diagnosis not present

## 2021-09-29 DIAGNOSIS — Z950 Presence of cardiac pacemaker: Secondary | ICD-10-CM | POA: Diagnosis not present

## 2021-09-29 DIAGNOSIS — K219 Gastro-esophageal reflux disease without esophagitis: Secondary | ICD-10-CM | POA: Diagnosis not present

## 2021-09-29 DIAGNOSIS — Z96642 Presence of left artificial hip joint: Secondary | ICD-10-CM | POA: Diagnosis not present

## 2021-09-29 DIAGNOSIS — Z9181 History of falling: Secondary | ICD-10-CM | POA: Diagnosis not present

## 2021-09-29 NOTE — Telephone Encounter (Signed)
Left a message to call back for triage.   Pt was at the Physical therapist office and it was noted that they are continuing to have Tremors and that their Blood pressure rises when they stand. Sitting it is 110/63 and when standing it is 112/105. Physical therapist office called in and was transferred to access nurse. They were instructed to take the patient to the ED for an evaluation. Pt has not been evaluated at the ED at this time.   Providing Access Nurse Documentation.

## 2021-09-29 NOTE — Telephone Encounter (Signed)
Melody Ochoa Pt physical Therapist called in stating that Pt is still having tremors, when Pt sits down blood pressure is normal but when Pt stands up blood pressure rises. Pt blood pressure standing is 112/105 and sitting is 110/63... Physical therapist have Pt with her. Transfer therapist to access nurse.

## 2021-09-30 NOTE — Telephone Encounter (Signed)
Left a message to call back.

## 2021-10-01 DIAGNOSIS — Z96642 Presence of left artificial hip joint: Secondary | ICD-10-CM | POA: Diagnosis not present

## 2021-10-01 DIAGNOSIS — Z8673 Personal history of transient ischemic attack (TIA), and cerebral infarction without residual deficits: Secondary | ICD-10-CM | POA: Diagnosis not present

## 2021-10-01 DIAGNOSIS — H5789 Other specified disorders of eye and adnexa: Secondary | ICD-10-CM | POA: Diagnosis not present

## 2021-10-01 DIAGNOSIS — Z7982 Long term (current) use of aspirin: Secondary | ICD-10-CM | POA: Diagnosis not present

## 2021-10-01 DIAGNOSIS — J9610 Chronic respiratory failure, unspecified whether with hypoxia or hypercapnia: Secondary | ICD-10-CM | POA: Diagnosis not present

## 2021-10-01 DIAGNOSIS — S72092D Other fracture of head and neck of left femur, subsequent encounter for closed fracture with routine healing: Secondary | ICD-10-CM | POA: Diagnosis not present

## 2021-10-01 DIAGNOSIS — I951 Orthostatic hypotension: Secondary | ICD-10-CM | POA: Diagnosis not present

## 2021-10-01 DIAGNOSIS — Z7902 Long term (current) use of antithrombotics/antiplatelets: Secondary | ICD-10-CM | POA: Diagnosis not present

## 2021-10-01 DIAGNOSIS — K219 Gastro-esophageal reflux disease without esophagitis: Secondary | ICD-10-CM | POA: Diagnosis not present

## 2021-10-01 DIAGNOSIS — N1831 Chronic kidney disease, stage 3a: Secondary | ICD-10-CM | POA: Diagnosis not present

## 2021-10-01 DIAGNOSIS — E785 Hyperlipidemia, unspecified: Secondary | ICD-10-CM | POA: Diagnosis not present

## 2021-10-01 DIAGNOSIS — G9009 Other idiopathic peripheral autonomic neuropathy: Secondary | ICD-10-CM | POA: Diagnosis not present

## 2021-10-01 DIAGNOSIS — Z9181 History of falling: Secondary | ICD-10-CM | POA: Diagnosis not present

## 2021-10-01 DIAGNOSIS — J8410 Pulmonary fibrosis, unspecified: Secondary | ICD-10-CM | POA: Diagnosis not present

## 2021-10-01 DIAGNOSIS — Z950 Presence of cardiac pacemaker: Secondary | ICD-10-CM | POA: Diagnosis not present

## 2021-10-01 DIAGNOSIS — Z9981 Dependence on supplemental oxygen: Secondary | ICD-10-CM | POA: Diagnosis not present

## 2021-10-01 DIAGNOSIS — D631 Anemia in chronic kidney disease: Secondary | ICD-10-CM | POA: Diagnosis not present

## 2021-10-01 NOTE — Telephone Encounter (Signed)
Left a message to call back. 3rd attempt to contact patient concerning Tremors and Blood pressure. Pt has not been seen at a  emergency department at this time. Sent a [employer] requesting a call back.

## 2021-10-01 NOTE — Telephone Encounter (Signed)
I rec she go to the hospital for evaluation asap if not feeling well or [facility] or [facility]  Also sch f/u with PCP asap but rec evaluation now if appt available 10/02/21 work in here

## 2021-10-02 ENCOUNTER — Encounter: Payer: Self-pay | Admitting: Emergency Medicine

## 2021-10-02 ENCOUNTER — Telehealth: Payer: Self-pay

## 2021-10-02 ENCOUNTER — Emergency Department: Payer: Medicare Other

## 2021-10-02 ENCOUNTER — Other Ambulatory Visit: Payer: Self-pay

## 2021-10-02 DIAGNOSIS — Z8673 Personal history of transient ischemic attack (TIA), and cerebral infarction without residual deficits: Secondary | ICD-10-CM | POA: Diagnosis not present

## 2021-10-02 DIAGNOSIS — Z91040 Latex allergy status: Secondary | ICD-10-CM | POA: Diagnosis not present

## 2021-10-02 DIAGNOSIS — R0600 Dyspnea, unspecified: Secondary | ICD-10-CM | POA: Diagnosis not present

## 2021-10-02 DIAGNOSIS — Z7982 Long term (current) use of aspirin: Secondary | ICD-10-CM | POA: Diagnosis not present

## 2021-10-02 DIAGNOSIS — Z87891 Personal history of nicotine dependence: Secondary | ICD-10-CM | POA: Diagnosis not present

## 2021-10-02 DIAGNOSIS — S299XXA Unspecified injury of thorax, initial encounter: Secondary | ICD-10-CM | POA: Diagnosis present

## 2021-10-02 DIAGNOSIS — W19XXXA Unspecified fall, initial encounter: Secondary | ICD-10-CM | POA: Diagnosis not present

## 2021-10-02 DIAGNOSIS — Z950 Presence of cardiac pacemaker: Secondary | ICD-10-CM | POA: Diagnosis not present

## 2021-10-02 DIAGNOSIS — Z20822 Contact with and (suspected) exposure to covid-19: Secondary | ICD-10-CM | POA: Insufficient documentation

## 2021-10-02 DIAGNOSIS — S2231XA Fracture of one rib, right side, initial encounter for closed fracture: Secondary | ICD-10-CM | POA: Diagnosis not present

## 2021-10-02 DIAGNOSIS — G9009 Other idiopathic peripheral autonomic neuropathy: Secondary | ICD-10-CM | POA: Diagnosis not present

## 2021-10-02 DIAGNOSIS — D631 Anemia in chronic kidney disease: Secondary | ICD-10-CM | POA: Diagnosis not present

## 2021-10-02 DIAGNOSIS — R0602 Shortness of breath: Secondary | ICD-10-CM | POA: Diagnosis not present

## 2021-10-02 DIAGNOSIS — I10 Essential (primary) hypertension: Secondary | ICD-10-CM | POA: Diagnosis not present

## 2021-10-02 DIAGNOSIS — N1831 Chronic kidney disease, stage 3a: Secondary | ICD-10-CM | POA: Insufficient documentation

## 2021-10-02 DIAGNOSIS — R059 Cough, unspecified: Secondary | ICD-10-CM | POA: Insufficient documentation

## 2021-10-02 DIAGNOSIS — Z96643 Presence of artificial hip joint, bilateral: Secondary | ICD-10-CM | POA: Diagnosis not present

## 2021-10-02 DIAGNOSIS — H5789 Other specified disorders of eye and adnexa: Secondary | ICD-10-CM | POA: Diagnosis not present

## 2021-10-02 DIAGNOSIS — J8410 Pulmonary fibrosis, unspecified: Secondary | ICD-10-CM | POA: Diagnosis not present

## 2021-10-02 DIAGNOSIS — Z85828 Personal history of other malignant neoplasm of skin: Secondary | ICD-10-CM | POA: Diagnosis not present

## 2021-10-02 DIAGNOSIS — I129 Hypertensive chronic kidney disease with stage 1 through stage 4 chronic kidney disease, or unspecified chronic kidney disease: Secondary | ICD-10-CM | POA: Diagnosis not present

## 2021-10-02 DIAGNOSIS — Z9181 History of falling: Secondary | ICD-10-CM | POA: Diagnosis not present

## 2021-10-02 DIAGNOSIS — R911 Solitary pulmonary nodule: Secondary | ICD-10-CM | POA: Diagnosis not present

## 2021-10-02 DIAGNOSIS — K219 Gastro-esophageal reflux disease without esophagitis: Secondary | ICD-10-CM | POA: Diagnosis not present

## 2021-10-02 DIAGNOSIS — Z79899 Other long term (current) drug therapy: Secondary | ICD-10-CM | POA: Insufficient documentation

## 2021-10-02 DIAGNOSIS — Z7902 Long term (current) use of antithrombotics/antiplatelets: Secondary | ICD-10-CM | POA: Diagnosis not present

## 2021-10-02 DIAGNOSIS — Z9981 Dependence on supplemental oxygen: Secondary | ICD-10-CM | POA: Diagnosis not present

## 2021-10-02 DIAGNOSIS — E785 Hyperlipidemia, unspecified: Secondary | ICD-10-CM | POA: Diagnosis not present

## 2021-10-02 DIAGNOSIS — Z96642 Presence of left artificial hip joint: Secondary | ICD-10-CM | POA: Diagnosis not present

## 2021-10-02 DIAGNOSIS — I951 Orthostatic hypotension: Secondary | ICD-10-CM | POA: Diagnosis not present

## 2021-10-02 DIAGNOSIS — J439 Emphysema, unspecified: Secondary | ICD-10-CM | POA: Diagnosis not present

## 2021-10-02 DIAGNOSIS — S72092D Other fracture of head and neck of left femur, subsequent encounter for closed fracture with routine healing: Secondary | ICD-10-CM | POA: Diagnosis not present

## 2021-10-02 DIAGNOSIS — J9610 Chronic respiratory failure, unspecified whether with hypoxia or hypercapnia: Secondary | ICD-10-CM | POA: Diagnosis not present

## 2021-10-02 LAB — BASIC METABOLIC PANEL
Anion gap: 11 (ref 5–15)
BUN: 18 mg/dL (ref 8–23)
CO2: 25 mmol/L (ref 22–32)
Calcium: 8.8 mg/dL — ABNORMAL LOW (ref 8.9–10.3)
Chloride: 102 mmol/L (ref 98–111)
Creatinine, Ser: 1.19 mg/dL — ABNORMAL HIGH (ref 0.44–1.00)
GFR, Estimated: 46 mL/min — ABNORMAL LOW (ref >60)
Glucose, Bld: 112 mg/dL — ABNORMAL HIGH (ref 70–99)
Potassium: 4 mmol/L (ref 3.5–5.1)
Sodium: 138 mmol/L (ref 135–145)

## 2021-10-02 LAB — CBC
HCT: 32.4 % — ABNORMAL LOW (ref 36.0–46.0)
Hemoglobin: 10.3 g/dL — ABNORMAL LOW (ref 12.0–15.0)
MCH: 28.2 pg (ref 26.0–34.0)
MCHC: 31.8 g/dL (ref 30.0–36.0)
MCV: 88.8 fL (ref 80.0–100.0)
Platelets: 274 10*3/uL (ref 150–400)
RBC: 3.65 MIL/uL — ABNORMAL LOW (ref 3.87–5.11)
RDW: 16.7 % — ABNORMAL HIGH (ref 11.5–15.5)
WBC: 10.4 10*3/uL (ref 4.0–10.5)
nRBC: 0 % (ref 0.0–0.2)

## 2021-10-02 NOTE — ED Triage Notes (Addendum)
First Nurse Note:  Presents for CXR.  Home health nurse heard "crackling in lungs" today and PCP wanted patient evaluated for possible pneumonia.  C/O cough and SOB  wears 2.5 l/ [place].  Cough has been ongoing x 4 weeks.  AAOx3.  Skin warm and dry. No SOB/ DOE.  NAD

## 2021-10-02 NOTE — Telephone Encounter (Signed)
Patient power of Attorney called letting us know patients blood pressure has been elevated running around 140's over 70's. She states changes to patients medications were supposed to be made by provider and thy are waiting to hear about that. Power of attorney states patient has been dizzy and light headed some. I informed her that if patients symptoms worsen or her blood pressure gets worse patient needs to be seen in Urgent Care or ER. Patient was advised to go to Urgent Care on 10/25 and did not go. Patient was scheduled for F/U with provider 11/7.  Patients power of attorney also ask if provider can write a letter for the bank saying the patient is not capable of making transactions. Power of attorney states the neurologist did a memory test and the results weren't good and that it would be best if patient is not in charge of her account. Bank is requesting letters from two doctors. Power of attorney states neurology has given her one and wants to know if [name] and can do the second one. Please advise.

## 2021-10-02 NOTE — Telephone Encounter (Signed)
Noted She didn't go to ED

## 2021-10-02 NOTE — Telephone Encounter (Signed)
RashCall pt and caregiver  Please speak with patient first and ask how she is feeling.  If she feeling dizzy or has she passed out?  Call yesterday that she was having tremors.  Patient was hospitalized recently and had low blood pressure . She was started on been on midodrine 10 mg 3 times daily.  Is she taking midodrine 10mg  TID and amlodipine 5 mg as she been on the past?  Please let me know and confirm. Please call pharmacy if patient cannot confirm.   Is  She still having tremors ?  I can see appointment with neurology , Kerry Dory PA, 09/25/2021 who recommended not overseeing medications or finances due to cognitive impairment You may write note to that effect if that is what is needed however please ensure that patient is aware of this change.

## 2021-10-02 NOTE — Telephone Encounter (Signed)
Left a message to call back to give provider message. Mychart message has already been sent as this is the 4th attempt.

## 2021-10-02 NOTE — Telephone Encounter (Signed)
Spoken to patients nurse. She stated patient is only taking midodrine 10mg  BID ans amlodipine 5mg  daily. Patient is still having tremors mainly when she is walking. She is feeling dizzy while ambulatory. The nurse is also hearing wheezing and lower lobe cackling in both lungs. Due to her sx instructed the nurse to have patient be evaluated. Patient refused to go to ED. She stated she will go to an UC.

## 2021-10-03 ENCOUNTER — Emergency Department: Payer: Medicare Other

## 2021-10-03 ENCOUNTER — Emergency Department
Admission: EM | Admit: 2021-10-03 | Discharge: 2021-10-03 | Disposition: A | Discharge status: Home / Self Care | Payer: Medicare Other | Attending: Emergency Medicine | Admitting: Emergency Medicine

## 2021-10-03 DIAGNOSIS — R059 Cough, unspecified: Secondary | ICD-10-CM | POA: Diagnosis not present

## 2021-10-03 DIAGNOSIS — R0600 Dyspnea, unspecified: Secondary | ICD-10-CM | POA: Diagnosis not present

## 2021-10-03 DIAGNOSIS — R911 Solitary pulmonary nodule: Secondary | ICD-10-CM

## 2021-10-03 DIAGNOSIS — S2231XA Fracture of one rib, right side, initial encounter for closed fracture: Secondary | ICD-10-CM

## 2021-10-03 LAB — LACTIC ACID, PLASMA: Lactic Acid, Venous: 0.9 mmol/L (ref 0.5–1.9)

## 2021-10-03 LAB — RESP PANEL BY RT-PCR (FLU A&B, COVID) ARPGX2
Influenza A by PCR: NEGATIVE
Influenza B by PCR: NEGATIVE
SARS Coronavirus 2 by RT PCR: NEGATIVE

## 2021-10-03 LAB — BRAIN NATRIURETIC PEPTIDE: B Natriuretic Peptide: 49.4 pg/mL (ref 0.0–100.0)

## 2021-10-03 LAB — TROPONIN I (HIGH SENSITIVITY)
Troponin I (High Sensitivity): 22 ng/L — ABNORMAL HIGH (ref <18)
Troponin I (High Sensitivity): 22 ng/L — ABNORMAL HIGH (ref <18)

## 2021-10-03 LAB — D-DIMER, QUANTITATIVE: D-Dimer, Quant: 2.02 ug/mL-FEU — ABNORMAL HIGH (ref 0.00–0.50)

## 2021-10-03 MED ORDER — SODIUM CHLORIDE 0.9 % IV BOLUS
500.0000 mL | Freq: Once | INTRAVENOUS | Status: AC
  Administered 2021-10-03: 500 mL via INTRAVENOUS

## 2021-10-03 MED ORDER — IOHEXOL 350 MG/ML SOLN
75.0000 mL | Freq: Once | INTRAVENOUS | Status: AC | PRN
  Administered 2021-10-03: 75 mL via INTRAVENOUS

## 2021-10-03 NOTE — ED Provider Notes (Signed)
[facility] Emergency Department Provider Note   ____________________________________________   Event Date/Time   First MD Initiated Contact with Patient 10/03/21 0011     (approximate)  I have reviewed the triage vital signs and the nursing notes.   HISTORY  Chief Complaint Cough    HPI [name] is a 80 y.o. female who presents to the ED from home with a chief complaint of cough and shortness of breath.  Patient with a history of pretension, CKD, pulmonary fibrosis on continuous 2.5 L nasal cannula oxygen.  Home health nurse heard crackles in her lungs today and PCP wanted patient evaluated for possible pneumonia.  Reports ongoing cough x4 weeks and shortness of breath.  Mentions she fell 1 week ago, striking her right ribs.  Did not seek medical evaluation at that time.  Denies fever, chills, chest pain, abdominal pain, nausea, vomiting or dizziness.     Past Medical History:  Diagnosis Date   Arthritis    Atheromatous plaque 10/18/2017   Back pain    Cancer ([place])    skin   Depression    Diverticulosis    Dizziness    patient had episode of dizziness when came in room. hx past no dx   GERD (gastroesophageal reflux disease)    Heart murmur    Hyperlipidemia 10/18/2017   PONV (postoperative nausea and vomiting)    yrs ago   Squamous cell carcinoma of skin 11/16/2016   L chest parasternal   Squamous cell carcinoma of skin 02/05/2020   L cheek inf to lat zygoma   Stroke [facility])     Patient Active Problem List   Diagnosis Date Noted   Weakness    Confusion    Hypotension    Acute blood loss anemia    Irritation of left eye    Acute respiratory failure with hypoxia (HCC)    Interstitial lung disease (HCC)    Gastroesophageal reflux disease without esophagitis    Leg swelling 07/13/2021   Intercostal neuralgia 04/16/2021   Multiple closed fractures of ribs of left side 04/16/2021   Adenoma of left adrenal gland 12/25/2020    Ischemic stroke ([place]) 12/10/2020   Chronic kidney disease, stage 3a ([place]) 12/09/2020   Chronic respiratory failure with hypoxia ([place]) 12/09/2020   Fall at home, initial encounter 12/09/2020   Right shoulder pain 12/09/2020   Memory deficit 09/24/2020   Pulmonary fibrosis ([place]) 09/14/2020   Chronic cough 09/14/2020   Arthritis of left sacroiliac joint 08/26/2020   Chronic left SI joint pain 08/26/2020   CKD (chronic kidney disease) 07/23/2020   HTN (hypertension) 04/23/2020   Positive fecal immunochemical test    Angiodysplasia of intestinal tract    Transient ischemic attack (TIA) 10/08/2019   Transient right leg weakness 09/30/2019   Peripheral neuropathy 08/10/2019   Primary osteoarthritis of left shoulder 07/24/2019   Elevated blood pressure reading 06/27/2019   Left shoulder pain 05/04/2019   Anemia 03/19/2019   Long term current use of opiate analgesic 01/09/2019   Foraminal stenosis of lumbar region 11/14/2018   Chronic pain syndrome 09/12/2018   SOB (shortness of breath) on exertion 08/11/2018   Dysphagia 08/11/2018   Cough 06/30/2018   Rash 01/16/2018   Hyperlipidemia 10/18/2017   History of lumbar surgery 10/04/2017   Depression, recurrent ([place]) 09/27/2017   Pulmonary nodules 09/27/2017   History of CVA (cerebrovascular accident) 09/12/2017   Cerebellar stroke, acute ([place]) 08/10/2017   Primary osteoarthritis of left hip 02/28/2017  Strain of left hip 02/28/2017   B12 deficiency 12/21/2016   Generalized OA 05/13/2016   Status post total hip replacement, left 04/22/2016   Personal history of colonic polyps    Greater trochanteric bursitis of both hips 01/09/2016   Prediabetes 08/04/2015   Thoracic aortic atherosclerosis ([place]) 06/16/2015   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   UTI (lower urinary tract infection) 11/22/2014   Low back pain 11/22/2014   Spondylolisthesis of lumbar region 11/18/2014   Lumbar radiculopathy 08/21/2014   Degeneration of lumbar or  lumbosacral intervertebral disc 08/21/2014   Acute hip pain, left 08/21/2014   Osteopenia 06/23/2014   Major depression in remission ([place]) 06/23/2014   Anxiety 06/23/2014   Menopause 06/23/2014    Past Surgical History:  Procedure Laterality Date   BACK SURGERY  13   BREAST BIOPSY Bilateral    cores "years ago"   BREAST EXCISIONAL BIOPSY Left 90's   CATARACT EXTRACTION W/PHACO Left 12/20/2018   Procedure: CATARACT EXTRACTION PHACO AND INTRAOCULAR LENS PLACEMENT ([place]) LEFT TOPICAL;  Surgeon: Leandrew Koyanagi, MD;  Location: [place];  Service: Ophthalmology;  Laterality: Left;   CATARACT EXTRACTION W/PHACO Right 01/24/2019   Procedure: CATARACT EXTRACTION PHACO AND INTRAOCULAR LENS PLACEMENT ([place]) RIGHT;  Surgeon: Leandrew Koyanagi, MD;  Location: [place];  Service: Ophthalmology;  Laterality: Right;   CERVICAL DISC SURGERY  09   COLONOSCOPY WITH PROPOFOL N/A 01/13/2016   Procedure: COLONOSCOPY WITH PROPOFOL;  Surgeon: [name], MD;  Location: [facility] ENDOSCOPY;  Service: Endoscopy;  Laterality: N/A;   COLONOSCOPY WITH PROPOFOL N/A 02/19/2020   Procedure: COLONOSCOPY WITH PROPOFOL;  Surgeon: [name], MD;  Location: [facility] ENDOSCOPY;  Service: Gastroenterology;  Laterality: N/A;   FACELIFT  94   LOOP RECORDER INSERTION N/A 08/23/2017   Procedure: LOOP RECORDER INSERTION;  Surgeon: Thompson Grayer, MD;  Location: [place] CV LAB;  Service: Cardiovascular;  Laterality: N/A;   TEE WITHOUT CARDIOVERSION N/A 08/12/2017   Procedure: TRANSESOPHAGEAL ECHOCARDIOGRAM (TEE);  Surgeon: Sueanne Margarita, MD;  Location: [facility] ENDOSCOPY;  Service: Cardiovascular;  Laterality: N/A;   TONSILLECTOMY     TOTAL HIP ARTHROPLASTY Right 04/22/2016   Procedure: TOTAL HIP ARTHROPLASTY;  Surgeon: Corky Mull, MD;  Location: [facility] ORS;  Service: Orthopedics;  Laterality: Right;   TOTAL HIP ARTHROPLASTY Left 08/05/2021   Procedure: TOTAL HIP ARTHROPLASTY;  Surgeon: Corky Mull, MD;  Location:  [facility] ORS;  Service: Orthopedics;  Laterality: Left;    Prior to Admission medications   Medication Sig Start Date End Date Taking? Authorizing Provider  acetaminophen (TYLENOL) 500 MG tablet Take 1-2 tablets (500-1,000 mg total) by mouth every 6 (six) hours as needed. 08/07/21   Lattie Corns, PA-C  albuterol (VENTOLIN HFA) 108 (90 Base) MCG/ACT inhaler TAKE 2 PUFFS BY MOUTH EVERY 6 HOURS AS NEEDED FOR WHEEZE OR SHORTNESS OF BREATH 09/21/21   Burnard Hawthorne, FNP  amLODipine (NORVASC) 5 MG tablet Take 1 tablet by mouth daily. 04/13/21   [provider]  atorvastatin (LIPITOR) 80 MG tablet Take 1 tablet (80 mg total) by mouth daily. 08/11/21   Burnard Hawthorne, FNP  buPROPion (WELLBUTRIN XL) 300 MG 24 hr tablet TAKE ONE TABLET BY MOUTH EVERY DAY 05/11/21   Burnard Hawthorne, FNP  clopidogrel (PLAVIX) 75 MG tablet Take 1 tablet (75 mg total) by mouth daily. 02/06/21   Burnard Hawthorne, FNP  docusate sodium (COLACE) 100 MG capsule Take 1 capsule (100 mg total) by mouth 2 (two) times daily.  08/07/21   Loletha Grayer, MD  DULoxetine (CYMBALTA) 30 MG capsule TAKE 1 CAPSULE BY MOUTH EVERY DAY 05/11/21   Burnard Hawthorne, FNP  famotidine (PEPCID) 20 MG tablet TAKE ONE TABLET BY MOUTH AT BEDTIME 05/11/21   Burnard Hawthorne, FNP  feeding supplement (ENSURE ENLIVE / ENSURE PLUS) LIQD Take 237 mLs by mouth 3 (three) times daily between meals. 08/07/21   Loletha Grayer, MD  ferrous sulfate 325 (65 FE) MG tablet Take 1 tablet (325 mg total) by mouth daily. 08/11/21 09/10/21  Loletha Grayer, MD  fluticasone (FLONASE) 50 MCG/ACT nasal spray SPRAY 2 SPRAYS INTO EACH NOSTRIL EVERY DAY 09/21/21   Burnard Hawthorne, FNP  menthol-cetylpyridinium (CEPACOL) 3 MG lozenge Take 1 lozenge (3 mg total) by mouth as needed for sore throat. 08/08/21   Loletha Grayer, MD  midodrine (PROAMATINE) 10 MG tablet Take 1 tablet (10 mg total) by mouth 3 (three) times daily with meals. 08/11/21   Loletha Grayer, MD   oxyCODONE (OXY IR/ROXICODONE) 5 MG immediate release tablet Take 0.5-1 tablets (2.5-5 mg total) by mouth every 4 (four) hours as needed for moderate pain (pain score 4-6). 08/07/21   Lattie Corns, PA-C  predniSONE (DELTASONE) 10 MG tablet Take 5 tabs on days 1,2. Take 4 tabs on days 3,4. Take 3 tabs days 5,6. Take 2 tabs days 7,8. Take 1 tabs days 9,10. 09/18/21   [provider]  pregabalin (LYRICA) 100 MG capsule Take 100 mg by mouth daily. 08/07/21   [provider]  pregabalin (LYRICA) 50 MG capsule TAKE 1 CAPSULE (50 MG) BY MOUTH EVERY DAY AND TAKE 2 CAPSULES (100 MG) AT BEDTIME. Max daily dose 150mg /day. 06/10/21   Gillis Santa, MD    Allergies Latex, Aleve [naproxen sodium], Aspirin, Celebrex [celecoxib], Effexor [venlafaxine], Gabapentin, Hydrocodone bit-homatrop mbr, Ibuprofen, Mobic [meloxicam], Tape, Vioxx [rofecoxib], Other, and Tramadol  Family History  Problem Relation Age of Onset   Lung cancer Father    Aneurysm Brother    Stroke Brother    Diabetes Maternal Grandmother    Kidney disease Maternal Grandfather    Cushing syndrome Paternal Grandmother    Dementia Paternal Grandfather    Breast cancer Maternal Aunt 52    Social History Social History   Tobacco Use   Smoking status: Former    Packs/day: 0.75    Years: 40.00    Pack years: 30.00    Types: Cigarettes    Quit date: 11/13/1996    Years since quitting: 24.9   Smokeless tobacco: Never  Vaping Use   Vaping Use: Never used  Substance Use Topics   Alcohol use: Yes    Comment: occ wine   Drug use: No    Review of Systems  Constitutional: No fever/chills Eyes: No visual changes. ENT: No sore throat. Cardiovascular: Denies chest pain. Respiratory: Positive for cough and shortness of breath. Gastrointestinal: No abdominal pain.  No nausea, no vomiting.  No diarrhea.  No constipation. Genitourinary: Negative for dysuria. Musculoskeletal: Negative for back pain. Skin: Negative for  rash. Neurological: Negative for headaches, focal weakness or numbness.   ____________________________________________   PHYSICAL EXAM:  VITAL SIGNS: ED Triage Vitals  Enc Vitals Group     BP 10/02/21 1822 110/80     Pulse Rate 10/02/21 1822 78     Resp 10/02/21 1822 16     Temp --      Temp src --      SpO2 10/02/21 1822 100 %  Weight 10/02/21 1454 162 lb 14.7 oz (73.9 kg)     Height 10/02/21 1454 5\' 4"  (1.626 m)     Head Circumference --      Peak Flow --      Pain Score 10/02/21 1454 0     Pain Loc --      Pain Edu? --      Excl. in [place]? --     Constitutional: Alert and oriented.  Elderly appearing and in no acute distress. Eyes: Conjunctivae are normal. PERRL. EOMI. Head: Atraumatic. Nose: No congestion/rhinnorhea. Mouth/Throat: Mucous membranes are mildly dry. Neck: No stridor.   Cardiovascular: Normal rate, regular rhythm. Grossly normal heart sounds.  Good peripheral circulation. Respiratory: Normal respiratory effort.  No retractions. Lungs CTAB.  No rales. Gastrointestinal: Soft and nontender to light or deep palpation. No distention. No abdominal bruits. No CVA tenderness. Musculoskeletal: No lower extremity tenderness nor edema.  No joint effusions. Neurologic:  Normal speech and language. No gross focal neurologic deficits are appreciated. No gait instability. Skin:  Skin is warm, dry and intact. No rash noted. Psychiatric: Mood and affect are normal. Speech and behavior are normal.  ____________________________________________   LABS (all labs ordered are listed, but only abnormal results are displayed)  Labs Reviewed  BASIC METABOLIC PANEL - Abnormal; Notable for the following components:      Result Value   Glucose, Bld 112 (*)    Creatinine, Ser 1.19 (*)    Calcium 8.8 (*)    GFR, Estimated 46 (*)    All other components within normal limits  CBC - Abnormal; Notable for the following components:   RBC 3.65 (*)    Hemoglobin 10.3 (*)    HCT  32.4 (*)    RDW 16.7 (*)    All other components within normal limits  D-DIMER, QUANTITATIVE - Abnormal; Notable for the following components:   D-Dimer, Quant 2.02 (*)    All other components within normal limits  TROPONIN I (HIGH SENSITIVITY) - Abnormal; Notable for the following components:   Troponin I (High Sensitivity) 22 (*)    All other components within normal limits  TROPONIN I (HIGH SENSITIVITY) - Abnormal; Notable for the following components:   Troponin I (High Sensitivity) 22 (*)    All other components within normal limits  RESP PANEL BY RT-PCR (FLU A&B, COVID) ARPGX2  LACTIC ACID, PLASMA  BRAIN NATRIURETIC PEPTIDE   ____________________________________________  EKG  ED ECG REPORT I, Lizvette Lightsey J, the attending physician, personally viewed and interpreted this ECG.   Date: 10/03/2021  EKG Time: 0317  Rate: 82  Rhythm: normal sinus rhythm  Axis: Normal  Intervals: QtC 510  ST&T Change: Nonspecific QtC 479 09/01/2021 ____________________________________________  RADIOLOGY I, Melissia Lahman J, personally viewed and evaluated these images (plain radiographs) as part of my medical decision making, as well as reviewing the written report by the radiologist.  ED MD interpretation: Chronic lung disease; no PE, acute nondisplaced right rib fracture without pneumothorax, pulmonary nodule  Official radiology report(s): DG Chest 2 View  Result Date: 10/02/2021 CLINICAL DATA:  Cough and shortness of breath. EXAM: CHEST - 2 VIEW COMPARISON:  Chest x-ray 07/30/2021 FINDINGS: Stable appearing severe chronic lung disease with emphysema and pulmonary scarring. No definite acute overlying pulmonary process is identified. No pleural effusions. The cardiac silhouette, mediastinal and hilar contours are stable. The bony thorax is intact. IMPRESSION: Chronic lung disease without definite acute overlying pulmonary process. Electronically Signed   By: Ricky Stabs.D.  On: 10/02/2021  15:26   CT Angio Chest PE W/Cm &/Or Wo Cm  Result Date: 10/03/2021 CLINICAL DATA:  Cough, dyspnea. PE suspected, low/intermediate prob, positive D-dimer. EXAM: CT ANGIOGRAPHY CHEST WITH CONTRAST TECHNIQUE: Multidetector CT imaging of the chest was performed using the standard protocol during bolus administration of intravenous contrast. Multiplanar CT image reconstructions and MIPs were obtained to evaluate the vascular anatomy. CONTRAST:  38mL OMNIPAQUE IOHEXOL 350 MG/ML SOLN COMPARISON:  12/21/2020 FINDINGS: Cardiovascular: Adequate opacification of the pulmonary arterial tree. No intraluminal filling defect identified to suggest acute pulmonary embolism. The central pulmonary arteries are of normal caliber. Moderate coronary artery calcification. Global cardiac size within normal limits. No pericardial effusion. Moderate atherosclerotic calcification within the thoracic aorta. No aortic aneurysm. Mediastinum/Nodes: No enlarged mediastinal, hilar, or axillary lymph nodes. Thyroid gland, trachea, and esophagus demonstrate no significant findings. Lungs/Pleura: Stable fibrotic change within the lungs bilaterally. Interval development of a 10 mm indeterminate subpleural noncalcified pulmonary nodule within the right upper lobe, axial image # 32/6. No confluent pulmonary infiltrate. No pneumothorax or pleural effusion. Central airways are widely patent Upper Abdomen: No acute abnormality. Musculoskeletal: Acute right seventh rib posterolateral fracture with minimal displacement. Multiple healed left rib fractures are noted. Review of the MIP images confirms the above findings. IMPRESSION: No acute pulmonary embolism. Acute minimally displaced fracture of the right seventh rib. No pneumothorax. Stable pulmonary fibrotic change. 10 mm right solid pulmonary nodule within the upper lobe. Consider a non-contrast Chest CT at 3 months, a PET/CT, or tissue sampling. These guidelines do not apply to immunocompromised  patients and patients with cancer. Follow up in patients with significant comorbidities as clinically warranted. For lung cancer screening, adhere to Lung-RADS guidelines. Reference: Radiology. 2017; 284(1):228-43. Moderate coronary artery calcification. Aortic Atherosclerosis (ICD10-I70.0). Electronically Signed   By: Fidela Salisbury M.D.   On: 10/03/2021 02:19    ____________________________________________   PROCEDURES  Procedure(s) performed (including Critical Care):  .1-3 Lead EKG Interpretation Performed by: Paulette Blanch, MD Authorized by: Paulette Blanch, MD     Interpretation: normal     ECG rate:  78   ECG rate assessment: normal     Rhythm: sinus rhythm     Ectopy: none     Conduction: normal   Comments:     Patient placed on cardiac monitor to evaluate for arrhythmias   ____________________________________________   INITIAL IMPRESSION / ASSESSMENT AND PLAN / ED COURSE  As part of my medical decision making, I reviewed the following data within the electronic MEDICAL RECORD NUMBER History obtained from family, Nursing notes reviewed and incorporated, Labs reviewed, EKG interpreted, Old chart reviewed, Radiograph reviewed, and Notes from prior ED visits     80 year old female sent to the ED for evaluation of pneumonia for cough and shortness of breath. Differential includes, but is not limited to, viral syndrome, bronchitis including COPD exacerbation, pneumonia, reactive airway disease including asthma, CHF including exacerbation with or without pulmonary/interstitial edema, pneumothorax, ACS, thoracic trauma, and pulmonary embolism.   Laboratory results demonstrate mild AKI.  No rales auscultated on examination.  Will check troponin, D-dimer, BNP, lactic acid.  Initiate gentle IV hydration.  Sandwich tray provided.  Will reassess.  Clinical Course as of 10/03/21 0443  Sat Oct 03, 2021  0311 Updated patient and caregiver of all test results. Will d/c home with incentive  spirometer. Strict return precautions given. Both verbalize understanding and agree with plan of care. [JS]    Clinical Course User Index [JS] Lurline Hare  J, MD     ____________________________________________   FINAL CLINICAL IMPRESSION(S) / ED DIAGNOSES  Final diagnoses:  Closed fracture of one rib of right side, initial encounter  Pulmonary nodule     ED Discharge Orders          Ordered    AMB  Referral to Pulmonary Nodule Clinic        10/03/21 0313             Note:  This document was prepared using Dragon voice recognition software and may include unintentional dictation errors.    Paulette Blanch, MD 10/03/21 [phone]

## 2021-10-03 NOTE — Discharge Instructions (Addendum)
1.  You may take Tylenol as needed for pain. 2.  Use incentive spirometer as instructed. 3.  Return to the ER for worsening symptoms, persistent vomiting, difficulty breathing or other concerns

## 2021-10-03 NOTE — ED Notes (Signed)
Lavender & light green top tubes sent to lab. Pt placed back on cardiac, BP, pulse ox monitors. No needs identified at this time. Bed low & locked; call light & personal items within reach.

## 2021-10-03 NOTE — ED Notes (Signed)
Pt resting comfortably in bed, NAD, chest rise & fall. Visitor at [facility]. No needs identified at this time. Bed low & locked; call light & personal items within reach.

## 2021-10-05 NOTE — Telephone Encounter (Signed)
I spoke with Jimmey Ralph in detail since I saw no recent notes un chart. Patient did leave ED with nothing more than a cracked crib. She stated that patient is doing okay & still has 24hr care in her home. She is scheduled for hospital f/u 11/7. She stated that patient is having episodes where her memory comes & goes. Pt was telling one of the other caregivers that POA, Rod Holler was stealing her checks & money from her. Then she has never mentioned concerns to Rod Holler herself. She said that she was been writing checks for bills & she explains all to patient so that she can knowingly sign checks. She asked could we provide note for her to take to the bank so that she can write checks to cover expenses as needed. I see where you were okay with this. Just wanted some advise on what would be appropriate to say in note?

## 2021-10-05 NOTE — Telephone Encounter (Signed)
LM for patient POA to call back.

## 2021-10-06 NOTE — Telephone Encounter (Signed)
I have called & d/c both meds with [facility] out of patient's pill packs. Also have called & spoke with Jimmey Ralph. She will be here 11/7 to be with patient. She also stated that they will begin to spot check patients BP.

## 2021-10-06 NOTE — Telephone Encounter (Signed)
Seen in ed 10/03/21

## 2021-10-06 NOTE — Telephone Encounter (Signed)
Noted See other phone note where this is addressed

## 2021-10-06 NOTE — Telephone Encounter (Signed)
Call pt   May call POA Rod Holler if she is on DPR contact list   Consulted with dr Caryl Comes and we agreed to the below  STOP both midodrine and amlodipine.   Advise her to call the office if BP were to be consistently greater than 135/80 and we can restart amlodipine at 2.5 mg at that time.  Please call pharmacy and ensure aware of change for pill packs  Please ask ruth to attend appt 10/12/21 either in person or by phone.  I can provide bank letter at that time

## 2021-10-07 DIAGNOSIS — D631 Anemia in chronic kidney disease: Secondary | ICD-10-CM | POA: Diagnosis not present

## 2021-10-07 DIAGNOSIS — Z7902 Long term (current) use of antithrombotics/antiplatelets: Secondary | ICD-10-CM | POA: Diagnosis not present

## 2021-10-07 DIAGNOSIS — E785 Hyperlipidemia, unspecified: Secondary | ICD-10-CM | POA: Diagnosis not present

## 2021-10-07 DIAGNOSIS — Z7982 Long term (current) use of aspirin: Secondary | ICD-10-CM | POA: Diagnosis not present

## 2021-10-07 DIAGNOSIS — H5789 Other specified disorders of eye and adnexa: Secondary | ICD-10-CM | POA: Diagnosis not present

## 2021-10-07 DIAGNOSIS — I951 Orthostatic hypotension: Secondary | ICD-10-CM | POA: Diagnosis not present

## 2021-10-07 DIAGNOSIS — Z9181 History of falling: Secondary | ICD-10-CM | POA: Diagnosis not present

## 2021-10-07 DIAGNOSIS — Z96642 Presence of left artificial hip joint: Secondary | ICD-10-CM | POA: Diagnosis not present

## 2021-10-07 DIAGNOSIS — Z950 Presence of cardiac pacemaker: Secondary | ICD-10-CM | POA: Diagnosis not present

## 2021-10-07 DIAGNOSIS — S72092D Other fracture of head and neck of left femur, subsequent encounter for closed fracture with routine healing: Secondary | ICD-10-CM | POA: Diagnosis not present

## 2021-10-07 DIAGNOSIS — N1831 Chronic kidney disease, stage 3a: Secondary | ICD-10-CM | POA: Diagnosis not present

## 2021-10-07 DIAGNOSIS — Z9981 Dependence on supplemental oxygen: Secondary | ICD-10-CM | POA: Diagnosis not present

## 2021-10-07 DIAGNOSIS — J9610 Chronic respiratory failure, unspecified whether with hypoxia or hypercapnia: Secondary | ICD-10-CM | POA: Diagnosis not present

## 2021-10-07 DIAGNOSIS — G9009 Other idiopathic peripheral autonomic neuropathy: Secondary | ICD-10-CM | POA: Diagnosis not present

## 2021-10-07 DIAGNOSIS — Z8673 Personal history of transient ischemic attack (TIA), and cerebral infarction without residual deficits: Secondary | ICD-10-CM | POA: Diagnosis not present

## 2021-10-07 DIAGNOSIS — J8410 Pulmonary fibrosis, unspecified: Secondary | ICD-10-CM | POA: Diagnosis not present

## 2021-10-07 DIAGNOSIS — K219 Gastro-esophageal reflux disease without esophagitis: Secondary | ICD-10-CM | POA: Diagnosis not present

## 2021-10-08 DIAGNOSIS — Z9981 Dependence on supplemental oxygen: Secondary | ICD-10-CM | POA: Diagnosis not present

## 2021-10-08 DIAGNOSIS — J8410 Pulmonary fibrosis, unspecified: Secondary | ICD-10-CM | POA: Diagnosis not present

## 2021-10-08 DIAGNOSIS — Z96642 Presence of left artificial hip joint: Secondary | ICD-10-CM | POA: Diagnosis not present

## 2021-10-08 DIAGNOSIS — D631 Anemia in chronic kidney disease: Secondary | ICD-10-CM | POA: Diagnosis not present

## 2021-10-08 DIAGNOSIS — N1831 Chronic kidney disease, stage 3a: Secondary | ICD-10-CM | POA: Diagnosis not present

## 2021-10-08 DIAGNOSIS — S72092D Other fracture of head and neck of left femur, subsequent encounter for closed fracture with routine healing: Secondary | ICD-10-CM | POA: Diagnosis not present

## 2021-10-08 DIAGNOSIS — Z8673 Personal history of transient ischemic attack (TIA), and cerebral infarction without residual deficits: Secondary | ICD-10-CM | POA: Diagnosis not present

## 2021-10-08 DIAGNOSIS — G9009 Other idiopathic peripheral autonomic neuropathy: Secondary | ICD-10-CM | POA: Diagnosis not present

## 2021-10-08 DIAGNOSIS — K219 Gastro-esophageal reflux disease without esophagitis: Secondary | ICD-10-CM | POA: Diagnosis not present

## 2021-10-08 DIAGNOSIS — Z9181 History of falling: Secondary | ICD-10-CM | POA: Diagnosis not present

## 2021-10-08 DIAGNOSIS — J9610 Chronic respiratory failure, unspecified whether with hypoxia or hypercapnia: Secondary | ICD-10-CM | POA: Diagnosis not present

## 2021-10-08 DIAGNOSIS — I951 Orthostatic hypotension: Secondary | ICD-10-CM | POA: Diagnosis not present

## 2021-10-08 DIAGNOSIS — Z7902 Long term (current) use of antithrombotics/antiplatelets: Secondary | ICD-10-CM | POA: Diagnosis not present

## 2021-10-08 DIAGNOSIS — H5789 Other specified disorders of eye and adnexa: Secondary | ICD-10-CM | POA: Diagnosis not present

## 2021-10-08 DIAGNOSIS — Z7982 Long term (current) use of aspirin: Secondary | ICD-10-CM | POA: Diagnosis not present

## 2021-10-08 DIAGNOSIS — Z950 Presence of cardiac pacemaker: Secondary | ICD-10-CM | POA: Diagnosis not present

## 2021-10-08 DIAGNOSIS — E785 Hyperlipidemia, unspecified: Secondary | ICD-10-CM | POA: Diagnosis not present

## 2021-10-09 DIAGNOSIS — J8410 Pulmonary fibrosis, unspecified: Secondary | ICD-10-CM | POA: Diagnosis not present

## 2021-10-09 DIAGNOSIS — E785 Hyperlipidemia, unspecified: Secondary | ICD-10-CM | POA: Diagnosis not present

## 2021-10-09 DIAGNOSIS — K219 Gastro-esophageal reflux disease without esophagitis: Secondary | ICD-10-CM | POA: Diagnosis not present

## 2021-10-09 DIAGNOSIS — Z8673 Personal history of transient ischemic attack (TIA), and cerebral infarction without residual deficits: Secondary | ICD-10-CM | POA: Diagnosis not present

## 2021-10-09 DIAGNOSIS — N1831 Chronic kidney disease, stage 3a: Secondary | ICD-10-CM | POA: Diagnosis not present

## 2021-10-09 DIAGNOSIS — Z950 Presence of cardiac pacemaker: Secondary | ICD-10-CM | POA: Diagnosis not present

## 2021-10-09 DIAGNOSIS — Z7902 Long term (current) use of antithrombotics/antiplatelets: Secondary | ICD-10-CM | POA: Diagnosis not present

## 2021-10-09 DIAGNOSIS — G9009 Other idiopathic peripheral autonomic neuropathy: Secondary | ICD-10-CM | POA: Diagnosis not present

## 2021-10-09 DIAGNOSIS — Z9181 History of falling: Secondary | ICD-10-CM | POA: Diagnosis not present

## 2021-10-09 DIAGNOSIS — Z7982 Long term (current) use of aspirin: Secondary | ICD-10-CM | POA: Diagnosis not present

## 2021-10-09 DIAGNOSIS — H5789 Other specified disorders of eye and adnexa: Secondary | ICD-10-CM | POA: Diagnosis not present

## 2021-10-09 DIAGNOSIS — D631 Anemia in chronic kidney disease: Secondary | ICD-10-CM | POA: Diagnosis not present

## 2021-10-09 DIAGNOSIS — J9610 Chronic respiratory failure, unspecified whether with hypoxia or hypercapnia: Secondary | ICD-10-CM | POA: Diagnosis not present

## 2021-10-09 DIAGNOSIS — Z96642 Presence of left artificial hip joint: Secondary | ICD-10-CM | POA: Diagnosis not present

## 2021-10-09 DIAGNOSIS — Z9981 Dependence on supplemental oxygen: Secondary | ICD-10-CM | POA: Diagnosis not present

## 2021-10-09 DIAGNOSIS — S72092D Other fracture of head and neck of left femur, subsequent encounter for closed fracture with routine healing: Secondary | ICD-10-CM | POA: Diagnosis not present

## 2021-10-09 DIAGNOSIS — I951 Orthostatic hypotension: Secondary | ICD-10-CM | POA: Diagnosis not present

## 2021-10-12 ENCOUNTER — Telehealth: Payer: Self-pay | Admitting: Family

## 2021-10-12 ENCOUNTER — Encounter: Payer: Self-pay | Admitting: Family

## 2021-10-12 ENCOUNTER — Ambulatory Visit (INDEPENDENT_AMBULATORY_CARE_PROVIDER_SITE_OTHER): Payer: Medicare Other | Admitting: Family

## 2021-10-12 ENCOUNTER — Other Ambulatory Visit: Payer: Self-pay

## 2021-10-12 VITALS — BP 90/50 | HR 89 | Temp 97.7°F | Ht 64.0 in | Wt 162.0 lb

## 2021-10-12 DIAGNOSIS — R911 Solitary pulmonary nodule: Secondary | ICD-10-CM

## 2021-10-12 DIAGNOSIS — J9611 Chronic respiratory failure with hypoxia: Secondary | ICD-10-CM

## 2021-10-12 DIAGNOSIS — I639 Cerebral infarction, unspecified: Secondary | ICD-10-CM | POA: Diagnosis not present

## 2021-10-12 DIAGNOSIS — I10 Essential (primary) hypertension: Secondary | ICD-10-CM | POA: Diagnosis not present

## 2021-10-12 NOTE — Progress Notes (Signed)
Subjective:    Patient ID: Melody Ochoa, female    DOB: 1941-07-22, 80 y.o.   MRN: [id number]  CC: Melody Ochoa is a 80 y.o. female who presents today for follow up.   HPI:  Friend, mother in law , Melody Ochoa, is Health care POA and accompanies her again today.  Melody Ochoa request a letter to bank stating that patient may no longer manage her finances in the setting of dementia.    Patient states that she continues to fall however not sure 'why'. She is trying to be more careful whereas she used to not pay as much attention. No numbness in legs. Her legs feel as they may give out. She sits most of the time.   She is using walker all the time at home. No rugs in the home. She doesn't go to second floor in home. No steps on first level.   She is working drinking more water.   She lives at home and has a Actuary around the clock and at night.   Currently has PT coming to the house, 2 times per week working on strength. No known falls assessment performed with PT.   She is no longer on amlodipine nor midodrine. Dizziness has improved.  No cp, syncope.  BP averages 120/70 at home.    Breathing is at baseline. She uses o2 prn. She didn't feel she needed o2 today for this appointment and is without.   Follows with Dr Holley Raring for lyrica. She is  not taking roxicodone as completed.  Seen in the emergency room for cough, SOB 10/03/2021 for right rib fracture  Chest X-ray shows chronic lung disease  CTA negative for PE.  Minimally displaced fracture of right seventh rib.  No hemothorax.  pulmonary fibrotic change.  10  millimeter right solid pulmonary nodule Mild AKI Troponin elevated 22 BNP 49 D dimer elevated EKG not released; per Dr Dolphus Jenny note NSR.   Pulmonary fibrosis-last seen by Dr. Patsey Berthold 10/2020  History of CVA-compliant with Plavix, statin.  Follow with Dr. Manuella Ghazi, neurology for history of CVA, cognitive impairment.  HISTORY:  Past Medical History:  Diagnosis Date  . Arthritis    . Atheromatous plaque 10/18/2017  . Back pain   . Cancer ([place])    skin  . Depression   . Diverticulosis   . Dizziness    patient had episode of dizziness when came in room. hx past no dx  . GERD (gastroesophageal reflux disease)   . Heart murmur   . Hyperlipidemia 10/18/2017  . PONV (postoperative nausea and vomiting)    yrs ago  . Squamous cell carcinoma of skin 11/16/2016   L chest parasternal  . Squamous cell carcinoma of skin 02/05/2020   L cheek inf to lat zygoma  . Stroke [facility])    Past Surgical History:  Procedure Laterality Date  . BACK SURGERY  13  . BREAST BIOPSY Bilateral    cores "years ago"  . BREAST EXCISIONAL BIOPSY Left 90's  . CATARACT EXTRACTION W/PHACO Left 12/20/2018   Procedure: CATARACT EXTRACTION PHACO AND INTRAOCULAR LENS PLACEMENT ([place]) LEFT TOPICAL;  Surgeon: Leandrew Koyanagi, MD;  Location: [place];  Service: Ophthalmology;  Laterality: Left;  . CATARACT EXTRACTION W/PHACO Right 01/24/2019   Procedure: CATARACT EXTRACTION PHACO AND INTRAOCULAR LENS PLACEMENT ([place]) RIGHT;  Surgeon: Leandrew Koyanagi, MD;  Location: [place];  Service: Ophthalmology;  Laterality: Right;  . CERVICAL DISC SURGERY  09  . COLONOSCOPY WITH PROPOFOL N/A 01/13/2016  Procedure: COLONOSCOPY WITH PROPOFOL;  Surgeon: [name], MD;  Location: [facility] ENDOSCOPY;  Service: Endoscopy;  Laterality: N/A;  . COLONOSCOPY WITH PROPOFOL N/A 02/19/2020   Procedure: COLONOSCOPY WITH PROPOFOL;  Surgeon: [name], MD;  Location: [facility] ENDOSCOPY;  Service: Gastroenterology;  Laterality: N/A;  . FACELIFT  94  . LOOP RECORDER INSERTION N/A 08/23/2017   Procedure: LOOP RECORDER INSERTION;  Surgeon: Thompson Grayer, MD;  Location: [place] CV LAB;  Service: Cardiovascular;  Laterality: N/A;  . TEE WITHOUT CARDIOVERSION N/A 08/12/2017   Procedure: TRANSESOPHAGEAL ECHOCARDIOGRAM (TEE);  Surgeon: Sueanne Margarita, MD;  Location: [facility] ENDOSCOPY;  Service: Cardiovascular;   Laterality: N/A;  . TONSILLECTOMY    . TOTAL HIP ARTHROPLASTY Right 04/22/2016   Procedure: TOTAL HIP ARTHROPLASTY;  Surgeon: Corky Mull, MD;  Location: [facility] ORS;  Service: Orthopedics;  Laterality: Right;  . TOTAL HIP ARTHROPLASTY Left 08/05/2021   Procedure: TOTAL HIP ARTHROPLASTY;  Surgeon: Corky Mull, MD;  Location: [facility] ORS;  Service: Orthopedics;  Laterality: Left;   Family History  Problem Relation Age of Onset  . Lung cancer Father   . Aneurysm Brother   . Stroke Brother   . Diabetes Maternal Grandmother   . Kidney disease Maternal Grandfather   . Cushing syndrome Paternal Grandmother   . Dementia Paternal Grandfather   . Breast cancer Maternal Aunt 52    Allergies: Latex, Aleve [naproxen sodium], Aspirin, Celebrex [celecoxib], Effexor [venlafaxine], Gabapentin, Hydrocodone bit-homatrop mbr, Ibuprofen, Mobic [meloxicam], Tape, Vioxx [rofecoxib], Other, and Tramadol Current Outpatient Medications on File Prior to Visit  Medication Sig Dispense Refill  . acetaminophen (TYLENOL) 500 MG tablet Take 1-2 tablets (500-1,000 mg total) by mouth every 6 (six) hours as needed. 60 tablet 0  . albuterol (VENTOLIN HFA) 108 (90 Base) MCG/ACT inhaler TAKE 2 PUFFS BY MOUTH EVERY 6 HOURS AS NEEDED FOR WHEEZE OR SHORTNESS OF BREATH 8.5 each 2  . atorvastatin (LIPITOR) 80 MG tablet Take 1 tablet (80 mg total) by mouth daily. 90 tablet 0  . buPROPion (WELLBUTRIN XL) 300 MG 24 hr tablet TAKE ONE TABLET BY MOUTH EVERY DAY 90 tablet 1  . clopidogrel (PLAVIX) 75 MG tablet Take 1 tablet (75 mg total) by mouth daily. 90 tablet 3  . docusate sodium (COLACE) 100 MG capsule Take 1 capsule (100 mg total) by mouth 2 (two) times daily. 60 capsule 0  . DULoxetine (CYMBALTA) 30 MG capsule TAKE 1 CAPSULE BY MOUTH EVERY DAY 90 capsule 1  . famotidine (PEPCID) 20 MG tablet TAKE ONE TABLET BY MOUTH AT BEDTIME 90 tablet 1  . feeding supplement (ENSURE ENLIVE / ENSURE PLUS) LIQD Take 237 mLs by mouth 3 (three) times  daily between meals. 14220 mL 0  . fluticasone (FLONASE) 50 MCG/ACT nasal spray SPRAY 2 SPRAYS INTO EACH NOSTRIL EVERY DAY 48 mL 1  . menthol-cetylpyridinium (CEPACOL) 3 MG lozenge Take 1 lozenge (3 mg total) by mouth as needed for sore throat. 15 tablet 12  . pregabalin (LYRICA) 50 MG capsule TAKE 1 CAPSULE (50 MG) BY MOUTH EVERY DAY AND TAKE 2 CAPSULES (100 MG) AT BEDTIME. Max daily dose 150mg /day. 90 capsule 1  . ferrous sulfate 325 (65 FE) MG tablet Take 1 tablet (325 mg total) by mouth daily. 30 tablet 0   No current facility-administered medications on file prior to visit.    Social History   Tobacco Use  . Smoking status: Former    Packs/day: 0.75    Years: 40.00    Pack years:  30.00    Types: Cigarettes    Quit date: 11/13/1996    Years since quitting: 24.9  . Smokeless tobacco: Never  Vaping Use  . Vaping Use: Never used  Substance Use Topics  . Alcohol use: Yes    Comment: occ wine  . Drug use: No    Review of Systems  Constitutional:  Negative for chills and fever.  Respiratory:  Positive for shortness of breath. Negative for cough. Stridor: at baseline.  Cardiovascular:  Negative for chest pain and palpitations.  Gastrointestinal:  Negative for nausea and vomiting.  Neurological:  Positive for dizziness (improved) and weakness (lower extremity weakness).     Objective:    BP (!) 90/50   Pulse 89   Temp 97.7 F (36.5 C)   Ht 5\' 4"  (1.626 m)   Wt 162 lb (73.5 kg)   SpO2 93%   BMI 27.81 kg/m  BP Readings from Last 3 Encounters:  10/12/21 (!) 90/50  10/03/21 114/68  09/21/21 130/62   Wt Readings from Last 3 Encounters:  10/12/21 162 lb (73.5 kg)  10/02/21 162 lb 14.7 oz (73.9 kg)  09/21/21 163 lb (73.9 kg)    Physical Exam Vitals reviewed.  Constitutional:      Appearance: She is well-developed.  Eyes:     Conjunctiva/sclera: Conjunctivae normal.  Cardiovascular:     Rate and Rhythm: Normal rate and regular rhythm.     Pulses: Normal pulses.      Heart sounds: Normal heart sounds.  Pulmonary:     Effort: Pulmonary effort is normal.     Breath sounds: Normal breath sounds. No wheezing, rhonchi or rales.  Musculoskeletal:     Right lower leg: No edema.     Left lower leg: No edema.  Skin:    General: Skin is warm and dry.  Neurological:     Mental Status: She is alert.     Comments: Strength 4/5 bilateral lower extremities. Sensation intact. When asked to stand from sitting position, patient used forearms to arise out of chair. Using walker  Psychiatric:        Speech: Speech normal.        Behavior: Behavior normal.        Thought Content: Thought content normal.       Assessment & Plan:   Problem List Items Addressed This Visit       Cardiovascular and Mediastinum   HTN (hypertension)    History of hypertension.  She is no longer on amlodipine.  Blood pressure on low end of normal today.  Dizziness has improved without amlodipine.  We will also stopped midodrine over the past week.  Blood pressures at home are normal.  I encouraged gentle hydration, liberation of salt in her diet, compression stockings.  Due to the history of elevated blood pressure, I am reluctant to restart midodrine at this time.  Advised patient to monitor for dizziness, blood pressure and let me know as we can certainly restart midodrine at a low dose.      Ischemic stroke [facility])    Patient remains compliant with Plavix, statin.  Upcoming follow-up with Dr. Manuella Ghazi 02/2022. Due to lower extremity weakness, h/o CVA, recurrent falls, cognitive impairment, patient will continue physical therapy and we have called out to physical therapy to include falls assessment and increase frequency from 2 times per week to 3 times per week.  Close follow-up.        Respiratory   Chronic respiratory failure with hypoxia (  [place])    No acute respiratory distress at this time.  Shortness of breath at baseline.  She will continue to follow with Dr. Patsey Berthold, to schedule  appointment November 17 2021.        Other   Lung nodule - Primary    Reviewed CTA performed in the emergency room.  Revealed 10  millimeter right solid pulmonary nodule which warrants interval follow up in 3 months  Referral to pulmonary nodule clinic for ongoing surveillance.  I also sent note to Dr Patsey Berthold so she is aware. She also continue following with  Dr Patsey Berthold and patient is aware of this.       Relevant Orders   AMB  Referral to Pulmonary Nodule Clinic     I have discontinued Seira Cody. Melody Ochoa oxyCODONE and predniSONE. I am also having her maintain her clopidogrel, DULoxetine, buPROPion, famotidine, pregabalin, acetaminophen, docusate sodium, feeding supplement, menthol-cetylpyridinium, ferrous sulfate, atorvastatin, fluticasone, and albuterol.   No orders of the defined types were placed in this encounter.   Return precautions given.   Risks, benefits, and alternatives of the medications and treatment plan prescribed today were discussed, and patient expressed understanding.   Education regarding symptom management and diagnosis given to patient on AVS.  Continue to follow with Burnard Hawthorne, FNP for routine health maintenance.   [name] and I agreed with plan.   [name], FNP   I have spent 40 minutes with a patient including precharting, exam, reviewing medical records, and discussion plan of care.

## 2021-10-12 NOTE — Telephone Encounter (Signed)
The name of patient's home health care is, [facility], [phone].

## 2021-10-12 NOTE — Patient Instructions (Addendum)
Call us with Physical therapy with name and contact information so we can include falls assessment and increase visits to 3/week.   Please let me know how you are doing.    You will require a CT of your chest to follow-up on the lung nodule identified in the emergency room in 3 months time.  I placed a referral to the pulmonary nodule clinic for surveillance. Let us know if you dont hear back within a week in regards to an appointment being scheduled.

## 2021-10-13 ENCOUNTER — Other Ambulatory Visit: Payer: Self-pay

## 2021-10-13 DIAGNOSIS — H5789 Other specified disorders of eye and adnexa: Secondary | ICD-10-CM | POA: Diagnosis not present

## 2021-10-13 DIAGNOSIS — Z7982 Long term (current) use of aspirin: Secondary | ICD-10-CM | POA: Diagnosis not present

## 2021-10-13 DIAGNOSIS — Z9181 History of falling: Secondary | ICD-10-CM | POA: Diagnosis not present

## 2021-10-13 DIAGNOSIS — E785 Hyperlipidemia, unspecified: Secondary | ICD-10-CM | POA: Diagnosis not present

## 2021-10-13 DIAGNOSIS — Z950 Presence of cardiac pacemaker: Secondary | ICD-10-CM | POA: Diagnosis not present

## 2021-10-13 DIAGNOSIS — J8410 Pulmonary fibrosis, unspecified: Secondary | ICD-10-CM | POA: Diagnosis not present

## 2021-10-13 DIAGNOSIS — N1831 Chronic kidney disease, stage 3a: Secondary | ICD-10-CM | POA: Diagnosis not present

## 2021-10-13 DIAGNOSIS — J9610 Chronic respiratory failure, unspecified whether with hypoxia or hypercapnia: Secondary | ICD-10-CM | POA: Diagnosis not present

## 2021-10-13 DIAGNOSIS — R8290 Unspecified abnormal findings in urine: Secondary | ICD-10-CM

## 2021-10-13 DIAGNOSIS — Z8673 Personal history of transient ischemic attack (TIA), and cerebral infarction without residual deficits: Secondary | ICD-10-CM | POA: Diagnosis not present

## 2021-10-13 DIAGNOSIS — G9009 Other idiopathic peripheral autonomic neuropathy: Secondary | ICD-10-CM | POA: Diagnosis not present

## 2021-10-13 DIAGNOSIS — K219 Gastro-esophageal reflux disease without esophagitis: Secondary | ICD-10-CM | POA: Diagnosis not present

## 2021-10-13 DIAGNOSIS — Z7902 Long term (current) use of antithrombotics/antiplatelets: Secondary | ICD-10-CM | POA: Diagnosis not present

## 2021-10-13 DIAGNOSIS — Z9981 Dependence on supplemental oxygen: Secondary | ICD-10-CM | POA: Diagnosis not present

## 2021-10-13 DIAGNOSIS — S72092D Other fracture of head and neck of left femur, subsequent encounter for closed fracture with routine healing: Secondary | ICD-10-CM | POA: Diagnosis not present

## 2021-10-13 DIAGNOSIS — Z96642 Presence of left artificial hip joint: Secondary | ICD-10-CM | POA: Diagnosis not present

## 2021-10-13 DIAGNOSIS — I951 Orthostatic hypotension: Secondary | ICD-10-CM | POA: Diagnosis not present

## 2021-10-13 DIAGNOSIS — D631 Anemia in chronic kidney disease: Secondary | ICD-10-CM | POA: Diagnosis not present

## 2021-10-13 NOTE — Telephone Encounter (Signed)
I called and spoke in depth with Larry Sierras, that has been seeing patient 1x per week. She stated that she would present at her meeting to morrow the possibility of adding on a day per week to her PT or OT. We were unsure as to which you were wanting? She also said that she would ask about to doing a more in depth fall risk assessment. She stated that issue in their end would be staffing & she was concerned as to what they were really able to provide for patient. She did say that she felt that patient's actions have been different for last maybe two weeks & that she has had a cognitive decline. She said that OT reported that urine smelled foul, but this was not discussed in office. I ordered urine culture & UA. Seth Bake will go out & collect then bring to office. If you don't mind signing I will place orders on your desk.  [place] Fax: [phone] Phone: [phone]  Letter has been signed & placed up front for Rod Holler, [place] to take to patient's bank.

## 2021-10-13 NOTE — Assessment & Plan Note (Addendum)
Patient remains compliant with Plavix, statin.  Upcoming follow-up with Dr. Manuella Ghazi 02/2022. Due to lower extremity weakness, h/o CVA, recurrent falls, cognitive impairment, patient will continue physical therapy and we have called out to physical therapy to include falls assessment and increase frequency from 2 times per week to 3 times per week.  Close follow-up.

## 2021-10-13 NOTE — Assessment & Plan Note (Signed)
No acute respiratory distress at this time.  Shortness of breath at baseline.  She will continue to follow with Dr. Patsey Berthold, to schedule appointment November 17 2021.

## 2021-10-13 NOTE — Assessment & Plan Note (Signed)
History of hypertension.  She is no longer on amlodipine.  Blood pressure on low end of normal today.  Dizziness has improved without amlodipine.  We will also stopped midodrine over the past week.  Blood pressures at home are normal.  I encouraged gentle hydration, liberation of salt in her diet, compression stockings.  Due to the history of elevated blood pressure, I am reluctant to restart midodrine at this time.  Advised patient to monitor for dizziness, blood pressure and let me know as we can certainly restart midodrine at a low dose.

## 2021-10-13 NOTE — Telephone Encounter (Signed)
I had asked family to call back with home health information so we could increase from twice weekly to 3 times weekly.  Also wanted for them to include a falls assessment of the home.  Please ensure these are added to treatment notes Also noted that caregiver needs letter for the bank.  Please ask again what this needs to say and provide a letter Lastly, I had meant to reiterate to patient in the setting of low blood pressure and occasional dizziness, that she can liberalize salt, essentially meaning she could add more salt to her diet which may help raise her blood pressure.  She also could ask caregiver to apply compression stockings as this can help raise blood pressure as well.  She will wear compression stockings only during the day, not at night.  Please asked them to continue to monitor blood pressure and for symptoms of dizziness.  We could restart the midodrine if needed.

## 2021-10-13 NOTE — Assessment & Plan Note (Addendum)
Reviewed CTA performed in the emergency room.  Revealed 10  millimeter right solid pulmonary nodule which warrants interval follow up in 3 months  Referral to pulmonary nodule clinic for ongoing surveillance.  I also sent note to Dr Patsey Berthold so she is aware. She also continue following with  Dr Patsey Berthold and patient is aware of this.

## 2021-10-14 ENCOUNTER — Other Ambulatory Visit: Payer: Medicare Other

## 2021-10-14 DIAGNOSIS — Z7982 Long term (current) use of aspirin: Secondary | ICD-10-CM | POA: Diagnosis not present

## 2021-10-14 DIAGNOSIS — E785 Hyperlipidemia, unspecified: Secondary | ICD-10-CM | POA: Diagnosis not present

## 2021-10-14 DIAGNOSIS — D631 Anemia in chronic kidney disease: Secondary | ICD-10-CM | POA: Diagnosis not present

## 2021-10-14 DIAGNOSIS — R8290 Unspecified abnormal findings in urine: Secondary | ICD-10-CM

## 2021-10-14 DIAGNOSIS — Z9181 History of falling: Secondary | ICD-10-CM | POA: Diagnosis not present

## 2021-10-14 DIAGNOSIS — Z8673 Personal history of transient ischemic attack (TIA), and cerebral infarction without residual deficits: Secondary | ICD-10-CM | POA: Diagnosis not present

## 2021-10-14 DIAGNOSIS — Z7902 Long term (current) use of antithrombotics/antiplatelets: Secondary | ICD-10-CM | POA: Diagnosis not present

## 2021-10-14 DIAGNOSIS — K219 Gastro-esophageal reflux disease without esophagitis: Secondary | ICD-10-CM | POA: Diagnosis not present

## 2021-10-14 DIAGNOSIS — Z950 Presence of cardiac pacemaker: Secondary | ICD-10-CM | POA: Diagnosis not present

## 2021-10-14 DIAGNOSIS — J8410 Pulmonary fibrosis, unspecified: Secondary | ICD-10-CM | POA: Diagnosis not present

## 2021-10-14 DIAGNOSIS — H5789 Other specified disorders of eye and adnexa: Secondary | ICD-10-CM | POA: Diagnosis not present

## 2021-10-14 DIAGNOSIS — Z96642 Presence of left artificial hip joint: Secondary | ICD-10-CM | POA: Diagnosis not present

## 2021-10-14 DIAGNOSIS — G9009 Other idiopathic peripheral autonomic neuropathy: Secondary | ICD-10-CM | POA: Diagnosis not present

## 2021-10-14 DIAGNOSIS — Z9981 Dependence on supplemental oxygen: Secondary | ICD-10-CM | POA: Diagnosis not present

## 2021-10-14 DIAGNOSIS — N1831 Chronic kidney disease, stage 3a: Secondary | ICD-10-CM | POA: Diagnosis not present

## 2021-10-14 DIAGNOSIS — J9610 Chronic respiratory failure, unspecified whether with hypoxia or hypercapnia: Secondary | ICD-10-CM | POA: Diagnosis not present

## 2021-10-14 DIAGNOSIS — I951 Orthostatic hypotension: Secondary | ICD-10-CM | POA: Diagnosis not present

## 2021-10-14 DIAGNOSIS — S72092D Other fracture of head and neck of left femur, subsequent encounter for closed fracture with routine healing: Secondary | ICD-10-CM | POA: Diagnosis not present

## 2021-10-14 NOTE — Addendum Note (Signed)
Addended by: Leeanne Rio on: 10/14/2021 04:56 PM   Modules accepted: Orders

## 2021-10-14 NOTE — Telephone Encounter (Signed)
noted 

## 2021-10-15 DIAGNOSIS — H5789 Other specified disorders of eye and adnexa: Secondary | ICD-10-CM | POA: Diagnosis not present

## 2021-10-15 DIAGNOSIS — G9009 Other idiopathic peripheral autonomic neuropathy: Secondary | ICD-10-CM | POA: Diagnosis not present

## 2021-10-15 DIAGNOSIS — Z7982 Long term (current) use of aspirin: Secondary | ICD-10-CM | POA: Diagnosis not present

## 2021-10-15 DIAGNOSIS — N1831 Chronic kidney disease, stage 3a: Secondary | ICD-10-CM | POA: Diagnosis not present

## 2021-10-15 DIAGNOSIS — I951 Orthostatic hypotension: Secondary | ICD-10-CM | POA: Diagnosis not present

## 2021-10-15 DIAGNOSIS — Z96642 Presence of left artificial hip joint: Secondary | ICD-10-CM | POA: Diagnosis not present

## 2021-10-15 DIAGNOSIS — J9610 Chronic respiratory failure, unspecified whether with hypoxia or hypercapnia: Secondary | ICD-10-CM | POA: Diagnosis not present

## 2021-10-15 DIAGNOSIS — Z7902 Long term (current) use of antithrombotics/antiplatelets: Secondary | ICD-10-CM | POA: Diagnosis not present

## 2021-10-15 DIAGNOSIS — J8410 Pulmonary fibrosis, unspecified: Secondary | ICD-10-CM | POA: Diagnosis not present

## 2021-10-15 DIAGNOSIS — Z950 Presence of cardiac pacemaker: Secondary | ICD-10-CM | POA: Diagnosis not present

## 2021-10-15 DIAGNOSIS — Z9181 History of falling: Secondary | ICD-10-CM | POA: Diagnosis not present

## 2021-10-15 DIAGNOSIS — Z9981 Dependence on supplemental oxygen: Secondary | ICD-10-CM | POA: Diagnosis not present

## 2021-10-15 DIAGNOSIS — D631 Anemia in chronic kidney disease: Secondary | ICD-10-CM | POA: Diagnosis not present

## 2021-10-15 DIAGNOSIS — Z8673 Personal history of transient ischemic attack (TIA), and cerebral infarction without residual deficits: Secondary | ICD-10-CM | POA: Diagnosis not present

## 2021-10-15 DIAGNOSIS — E785 Hyperlipidemia, unspecified: Secondary | ICD-10-CM | POA: Diagnosis not present

## 2021-10-15 DIAGNOSIS — M6281 Muscle weakness (generalized): Secondary | ICD-10-CM | POA: Diagnosis not present

## 2021-10-15 DIAGNOSIS — S72092D Other fracture of head and neck of left femur, subsequent encounter for closed fracture with routine healing: Secondary | ICD-10-CM | POA: Diagnosis not present

## 2021-10-15 DIAGNOSIS — K219 Gastro-esophageal reflux disease without esophagitis: Secondary | ICD-10-CM | POA: Diagnosis not present

## 2021-10-15 LAB — URINALYSIS, ROUTINE W REFLEX MICROSCOPIC
Bilirubin, UA: NEGATIVE
Glucose, UA: NEGATIVE
Ketones, UA: NEGATIVE
Leukocytes,UA: NEGATIVE
Nitrite, UA: NEGATIVE
Protein,UA: NEGATIVE
RBC, UA: NEGATIVE
Specific Gravity, UA: 1.024 (ref 1.005–1.030)
Urobilinogen, Ur: 1 mg/dL (ref 0.2–1.0)
pH, UA: 6.5 (ref 5.0–7.5)

## 2021-10-19 DIAGNOSIS — Z8673 Personal history of transient ischemic attack (TIA), and cerebral infarction without residual deficits: Secondary | ICD-10-CM | POA: Diagnosis not present

## 2021-10-19 DIAGNOSIS — J8410 Pulmonary fibrosis, unspecified: Secondary | ICD-10-CM | POA: Diagnosis not present

## 2021-10-19 DIAGNOSIS — N1831 Chronic kidney disease, stage 3a: Secondary | ICD-10-CM | POA: Diagnosis not present

## 2021-10-19 DIAGNOSIS — Z9981 Dependence on supplemental oxygen: Secondary | ICD-10-CM | POA: Diagnosis not present

## 2021-10-19 DIAGNOSIS — Z9181 History of falling: Secondary | ICD-10-CM | POA: Diagnosis not present

## 2021-10-19 DIAGNOSIS — Z950 Presence of cardiac pacemaker: Secondary | ICD-10-CM | POA: Diagnosis not present

## 2021-10-19 DIAGNOSIS — J9610 Chronic respiratory failure, unspecified whether with hypoxia or hypercapnia: Secondary | ICD-10-CM | POA: Diagnosis not present

## 2021-10-19 DIAGNOSIS — E785 Hyperlipidemia, unspecified: Secondary | ICD-10-CM | POA: Diagnosis not present

## 2021-10-19 DIAGNOSIS — Z7902 Long term (current) use of antithrombotics/antiplatelets: Secondary | ICD-10-CM | POA: Diagnosis not present

## 2021-10-19 DIAGNOSIS — S72092D Other fracture of head and neck of left femur, subsequent encounter for closed fracture with routine healing: Secondary | ICD-10-CM | POA: Diagnosis not present

## 2021-10-19 DIAGNOSIS — Z7982 Long term (current) use of aspirin: Secondary | ICD-10-CM | POA: Diagnosis not present

## 2021-10-19 DIAGNOSIS — H5789 Other specified disorders of eye and adnexa: Secondary | ICD-10-CM | POA: Diagnosis not present

## 2021-10-19 DIAGNOSIS — D631 Anemia in chronic kidney disease: Secondary | ICD-10-CM | POA: Diagnosis not present

## 2021-10-19 DIAGNOSIS — G9009 Other idiopathic peripheral autonomic neuropathy: Secondary | ICD-10-CM | POA: Diagnosis not present

## 2021-10-19 DIAGNOSIS — I951 Orthostatic hypotension: Secondary | ICD-10-CM | POA: Diagnosis not present

## 2021-10-19 DIAGNOSIS — Z96642 Presence of left artificial hip joint: Secondary | ICD-10-CM | POA: Diagnosis not present

## 2021-10-19 DIAGNOSIS — K219 Gastro-esophageal reflux disease without esophagitis: Secondary | ICD-10-CM | POA: Diagnosis not present

## 2021-10-19 LAB — URINE CULTURE

## 2021-10-21 ENCOUNTER — Telehealth: Payer: Self-pay | Admitting: Family

## 2021-10-21 DIAGNOSIS — Z8673 Personal history of transient ischemic attack (TIA), and cerebral infarction without residual deficits: Secondary | ICD-10-CM | POA: Diagnosis not present

## 2021-10-21 DIAGNOSIS — Z9981 Dependence on supplemental oxygen: Secondary | ICD-10-CM | POA: Diagnosis not present

## 2021-10-21 DIAGNOSIS — G9009 Other idiopathic peripheral autonomic neuropathy: Secondary | ICD-10-CM | POA: Diagnosis not present

## 2021-10-21 DIAGNOSIS — Z7902 Long term (current) use of antithrombotics/antiplatelets: Secondary | ICD-10-CM | POA: Diagnosis not present

## 2021-10-21 DIAGNOSIS — N1831 Chronic kidney disease, stage 3a: Secondary | ICD-10-CM | POA: Diagnosis not present

## 2021-10-21 DIAGNOSIS — S72092D Other fracture of head and neck of left femur, subsequent encounter for closed fracture with routine healing: Secondary | ICD-10-CM | POA: Diagnosis not present

## 2021-10-21 DIAGNOSIS — Z950 Presence of cardiac pacemaker: Secondary | ICD-10-CM | POA: Diagnosis not present

## 2021-10-21 DIAGNOSIS — H5789 Other specified disorders of eye and adnexa: Secondary | ICD-10-CM | POA: Diagnosis not present

## 2021-10-21 DIAGNOSIS — J8410 Pulmonary fibrosis, unspecified: Secondary | ICD-10-CM | POA: Diagnosis not present

## 2021-10-21 DIAGNOSIS — D631 Anemia in chronic kidney disease: Secondary | ICD-10-CM | POA: Diagnosis not present

## 2021-10-21 DIAGNOSIS — J9610 Chronic respiratory failure, unspecified whether with hypoxia or hypercapnia: Secondary | ICD-10-CM | POA: Diagnosis not present

## 2021-10-21 DIAGNOSIS — I951 Orthostatic hypotension: Secondary | ICD-10-CM | POA: Diagnosis not present

## 2021-10-21 DIAGNOSIS — E785 Hyperlipidemia, unspecified: Secondary | ICD-10-CM | POA: Diagnosis not present

## 2021-10-21 DIAGNOSIS — K219 Gastro-esophageal reflux disease without esophagitis: Secondary | ICD-10-CM | POA: Diagnosis not present

## 2021-10-21 DIAGNOSIS — Z96642 Presence of left artificial hip joint: Secondary | ICD-10-CM | POA: Diagnosis not present

## 2021-10-21 DIAGNOSIS — Z7982 Long term (current) use of aspirin: Secondary | ICD-10-CM | POA: Diagnosis not present

## 2021-10-21 DIAGNOSIS — Z9181 History of falling: Secondary | ICD-10-CM | POA: Diagnosis not present

## 2021-10-21 NOTE — Telephone Encounter (Signed)
Lanelle Bal from [facility] called, [phone]. Patient fell last week and is now complaining her rt elbow  hurting. Lanelle Bal is also faxing over verbal orders.

## 2021-10-21 NOTE — Telephone Encounter (Signed)
Home Health Nurse called about patient. Wanted to make sure office received message.

## 2021-10-21 NOTE — Telephone Encounter (Signed)
I have received & faxed back to [facility]

## 2021-10-22 DIAGNOSIS — N1831 Chronic kidney disease, stage 3a: Secondary | ICD-10-CM | POA: Diagnosis not present

## 2021-10-22 DIAGNOSIS — I951 Orthostatic hypotension: Secondary | ICD-10-CM | POA: Diagnosis not present

## 2021-10-22 DIAGNOSIS — S72092D Other fracture of head and neck of left femur, subsequent encounter for closed fracture with routine healing: Secondary | ICD-10-CM | POA: Diagnosis not present

## 2021-10-22 DIAGNOSIS — Z9181 History of falling: Secondary | ICD-10-CM | POA: Diagnosis not present

## 2021-10-22 DIAGNOSIS — Z96642 Presence of left artificial hip joint: Secondary | ICD-10-CM | POA: Diagnosis not present

## 2021-10-22 DIAGNOSIS — E785 Hyperlipidemia, unspecified: Secondary | ICD-10-CM | POA: Diagnosis not present

## 2021-10-22 DIAGNOSIS — Z9981 Dependence on supplemental oxygen: Secondary | ICD-10-CM | POA: Diagnosis not present

## 2021-10-22 DIAGNOSIS — K219 Gastro-esophageal reflux disease without esophagitis: Secondary | ICD-10-CM | POA: Diagnosis not present

## 2021-10-22 DIAGNOSIS — Z950 Presence of cardiac pacemaker: Secondary | ICD-10-CM | POA: Diagnosis not present

## 2021-10-22 DIAGNOSIS — Z8673 Personal history of transient ischemic attack (TIA), and cerebral infarction without residual deficits: Secondary | ICD-10-CM | POA: Diagnosis not present

## 2021-10-22 DIAGNOSIS — J8410 Pulmonary fibrosis, unspecified: Secondary | ICD-10-CM | POA: Diagnosis not present

## 2021-10-22 DIAGNOSIS — G9009 Other idiopathic peripheral autonomic neuropathy: Secondary | ICD-10-CM | POA: Diagnosis not present

## 2021-10-22 DIAGNOSIS — D631 Anemia in chronic kidney disease: Secondary | ICD-10-CM | POA: Diagnosis not present

## 2021-10-22 DIAGNOSIS — J9610 Chronic respiratory failure, unspecified whether with hypoxia or hypercapnia: Secondary | ICD-10-CM | POA: Diagnosis not present

## 2021-10-22 DIAGNOSIS — H5789 Other specified disorders of eye and adnexa: Secondary | ICD-10-CM | POA: Diagnosis not present

## 2021-10-22 DIAGNOSIS — Z7982 Long term (current) use of aspirin: Secondary | ICD-10-CM | POA: Diagnosis not present

## 2021-10-22 DIAGNOSIS — Z7902 Long term (current) use of antithrombotics/antiplatelets: Secondary | ICD-10-CM | POA: Diagnosis not present

## 2021-10-23 DIAGNOSIS — N1831 Chronic kidney disease, stage 3a: Secondary | ICD-10-CM | POA: Diagnosis not present

## 2021-10-23 DIAGNOSIS — E785 Hyperlipidemia, unspecified: Secondary | ICD-10-CM | POA: Diagnosis not present

## 2021-10-23 DIAGNOSIS — H5789 Other specified disorders of eye and adnexa: Secondary | ICD-10-CM | POA: Diagnosis not present

## 2021-10-23 DIAGNOSIS — G9009 Other idiopathic peripheral autonomic neuropathy: Secondary | ICD-10-CM | POA: Diagnosis not present

## 2021-10-23 DIAGNOSIS — Z96642 Presence of left artificial hip joint: Secondary | ICD-10-CM | POA: Diagnosis not present

## 2021-10-23 DIAGNOSIS — J8410 Pulmonary fibrosis, unspecified: Secondary | ICD-10-CM | POA: Diagnosis not present

## 2021-10-23 DIAGNOSIS — S72092D Other fracture of head and neck of left femur, subsequent encounter for closed fracture with routine healing: Secondary | ICD-10-CM | POA: Diagnosis not present

## 2021-10-23 DIAGNOSIS — Z7982 Long term (current) use of aspirin: Secondary | ICD-10-CM | POA: Diagnosis not present

## 2021-10-23 DIAGNOSIS — K219 Gastro-esophageal reflux disease without esophagitis: Secondary | ICD-10-CM | POA: Diagnosis not present

## 2021-10-23 DIAGNOSIS — Z950 Presence of cardiac pacemaker: Secondary | ICD-10-CM | POA: Diagnosis not present

## 2021-10-23 DIAGNOSIS — D631 Anemia in chronic kidney disease: Secondary | ICD-10-CM | POA: Diagnosis not present

## 2021-10-23 DIAGNOSIS — Z9981 Dependence on supplemental oxygen: Secondary | ICD-10-CM | POA: Diagnosis not present

## 2021-10-23 DIAGNOSIS — Z7902 Long term (current) use of antithrombotics/antiplatelets: Secondary | ICD-10-CM | POA: Diagnosis not present

## 2021-10-23 DIAGNOSIS — J9610 Chronic respiratory failure, unspecified whether with hypoxia or hypercapnia: Secondary | ICD-10-CM | POA: Diagnosis not present

## 2021-10-23 DIAGNOSIS — I951 Orthostatic hypotension: Secondary | ICD-10-CM | POA: Diagnosis not present

## 2021-10-23 DIAGNOSIS — Z9181 History of falling: Secondary | ICD-10-CM | POA: Diagnosis not present

## 2021-10-23 DIAGNOSIS — Z8673 Personal history of transient ischemic attack (TIA), and cerebral infarction without residual deficits: Secondary | ICD-10-CM | POA: Diagnosis not present

## 2021-10-25 DIAGNOSIS — E785 Hyperlipidemia, unspecified: Secondary | ICD-10-CM | POA: Diagnosis not present

## 2021-10-25 DIAGNOSIS — N1831 Chronic kidney disease, stage 3a: Secondary | ICD-10-CM | POA: Diagnosis not present

## 2021-10-25 DIAGNOSIS — Z96642 Presence of left artificial hip joint: Secondary | ICD-10-CM | POA: Diagnosis not present

## 2021-10-25 DIAGNOSIS — K219 Gastro-esophageal reflux disease without esophagitis: Secondary | ICD-10-CM | POA: Diagnosis not present

## 2021-10-25 DIAGNOSIS — Z950 Presence of cardiac pacemaker: Secondary | ICD-10-CM | POA: Diagnosis not present

## 2021-10-25 DIAGNOSIS — Z9181 History of falling: Secondary | ICD-10-CM | POA: Diagnosis not present

## 2021-10-25 DIAGNOSIS — Z7902 Long term (current) use of antithrombotics/antiplatelets: Secondary | ICD-10-CM | POA: Diagnosis not present

## 2021-10-25 DIAGNOSIS — J9610 Chronic respiratory failure, unspecified whether with hypoxia or hypercapnia: Secondary | ICD-10-CM | POA: Diagnosis not present

## 2021-10-25 DIAGNOSIS — G9009 Other idiopathic peripheral autonomic neuropathy: Secondary | ICD-10-CM | POA: Diagnosis not present

## 2021-10-25 DIAGNOSIS — S72092D Other fracture of head and neck of left femur, subsequent encounter for closed fracture with routine healing: Secondary | ICD-10-CM | POA: Diagnosis not present

## 2021-10-25 DIAGNOSIS — Z9981 Dependence on supplemental oxygen: Secondary | ICD-10-CM | POA: Diagnosis not present

## 2021-10-25 DIAGNOSIS — I951 Orthostatic hypotension: Secondary | ICD-10-CM | POA: Diagnosis not present

## 2021-10-25 DIAGNOSIS — J8410 Pulmonary fibrosis, unspecified: Secondary | ICD-10-CM | POA: Diagnosis not present

## 2021-10-25 DIAGNOSIS — D631 Anemia in chronic kidney disease: Secondary | ICD-10-CM | POA: Diagnosis not present

## 2021-10-25 DIAGNOSIS — H5789 Other specified disorders of eye and adnexa: Secondary | ICD-10-CM | POA: Diagnosis not present

## 2021-10-25 DIAGNOSIS — Z7982 Long term (current) use of aspirin: Secondary | ICD-10-CM | POA: Diagnosis not present

## 2021-10-25 DIAGNOSIS — Z8673 Personal history of transient ischemic attack (TIA), and cerebral infarction without residual deficits: Secondary | ICD-10-CM | POA: Diagnosis not present

## 2021-10-26 DIAGNOSIS — Z7902 Long term (current) use of antithrombotics/antiplatelets: Secondary | ICD-10-CM | POA: Diagnosis not present

## 2021-10-26 DIAGNOSIS — S72092D Other fracture of head and neck of left femur, subsequent encounter for closed fracture with routine healing: Secondary | ICD-10-CM | POA: Diagnosis not present

## 2021-10-26 DIAGNOSIS — Z950 Presence of cardiac pacemaker: Secondary | ICD-10-CM | POA: Diagnosis not present

## 2021-10-26 DIAGNOSIS — E785 Hyperlipidemia, unspecified: Secondary | ICD-10-CM | POA: Diagnosis not present

## 2021-10-26 DIAGNOSIS — Z9181 History of falling: Secondary | ICD-10-CM | POA: Diagnosis not present

## 2021-10-26 DIAGNOSIS — Z9981 Dependence on supplemental oxygen: Secondary | ICD-10-CM | POA: Diagnosis not present

## 2021-10-26 DIAGNOSIS — I951 Orthostatic hypotension: Secondary | ICD-10-CM | POA: Diagnosis not present

## 2021-10-26 DIAGNOSIS — N1831 Chronic kidney disease, stage 3a: Secondary | ICD-10-CM | POA: Diagnosis not present

## 2021-10-26 DIAGNOSIS — G9009 Other idiopathic peripheral autonomic neuropathy: Secondary | ICD-10-CM | POA: Diagnosis not present

## 2021-10-26 DIAGNOSIS — H5789 Other specified disorders of eye and adnexa: Secondary | ICD-10-CM | POA: Diagnosis not present

## 2021-10-26 DIAGNOSIS — D631 Anemia in chronic kidney disease: Secondary | ICD-10-CM | POA: Diagnosis not present

## 2021-10-26 DIAGNOSIS — J9610 Chronic respiratory failure, unspecified whether with hypoxia or hypercapnia: Secondary | ICD-10-CM | POA: Diagnosis not present

## 2021-10-26 DIAGNOSIS — Z7982 Long term (current) use of aspirin: Secondary | ICD-10-CM | POA: Diagnosis not present

## 2021-10-26 DIAGNOSIS — K219 Gastro-esophageal reflux disease without esophagitis: Secondary | ICD-10-CM | POA: Diagnosis not present

## 2021-10-26 DIAGNOSIS — Z8673 Personal history of transient ischemic attack (TIA), and cerebral infarction without residual deficits: Secondary | ICD-10-CM | POA: Diagnosis not present

## 2021-10-26 DIAGNOSIS — J8410 Pulmonary fibrosis, unspecified: Secondary | ICD-10-CM | POA: Diagnosis not present

## 2021-10-26 DIAGNOSIS — Z96642 Presence of left artificial hip joint: Secondary | ICD-10-CM | POA: Diagnosis not present

## 2021-10-27 DIAGNOSIS — K219 Gastro-esophageal reflux disease without esophagitis: Secondary | ICD-10-CM | POA: Diagnosis not present

## 2021-10-27 DIAGNOSIS — Z7902 Long term (current) use of antithrombotics/antiplatelets: Secondary | ICD-10-CM | POA: Diagnosis not present

## 2021-10-27 DIAGNOSIS — I951 Orthostatic hypotension: Secondary | ICD-10-CM | POA: Diagnosis not present

## 2021-10-27 DIAGNOSIS — D631 Anemia in chronic kidney disease: Secondary | ICD-10-CM | POA: Diagnosis not present

## 2021-10-27 DIAGNOSIS — Z96642 Presence of left artificial hip joint: Secondary | ICD-10-CM | POA: Diagnosis not present

## 2021-10-27 DIAGNOSIS — Z7982 Long term (current) use of aspirin: Secondary | ICD-10-CM | POA: Diagnosis not present

## 2021-10-27 DIAGNOSIS — Z8673 Personal history of transient ischemic attack (TIA), and cerebral infarction without residual deficits: Secondary | ICD-10-CM | POA: Diagnosis not present

## 2021-10-27 DIAGNOSIS — Z950 Presence of cardiac pacemaker: Secondary | ICD-10-CM | POA: Diagnosis not present

## 2021-10-27 DIAGNOSIS — J8410 Pulmonary fibrosis, unspecified: Secondary | ICD-10-CM | POA: Diagnosis not present

## 2021-10-27 DIAGNOSIS — Z9981 Dependence on supplemental oxygen: Secondary | ICD-10-CM | POA: Diagnosis not present

## 2021-10-27 DIAGNOSIS — G9009 Other idiopathic peripheral autonomic neuropathy: Secondary | ICD-10-CM | POA: Diagnosis not present

## 2021-10-27 DIAGNOSIS — E785 Hyperlipidemia, unspecified: Secondary | ICD-10-CM | POA: Diagnosis not present

## 2021-10-27 DIAGNOSIS — J9610 Chronic respiratory failure, unspecified whether with hypoxia or hypercapnia: Secondary | ICD-10-CM | POA: Diagnosis not present

## 2021-10-27 DIAGNOSIS — H5789 Other specified disorders of eye and adnexa: Secondary | ICD-10-CM | POA: Diagnosis not present

## 2021-10-27 DIAGNOSIS — Z9181 History of falling: Secondary | ICD-10-CM | POA: Diagnosis not present

## 2021-10-27 DIAGNOSIS — N1831 Chronic kidney disease, stage 3a: Secondary | ICD-10-CM | POA: Diagnosis not present

## 2021-10-27 DIAGNOSIS — S72092D Other fracture of head and neck of left femur, subsequent encounter for closed fracture with routine healing: Secondary | ICD-10-CM | POA: Diagnosis not present

## 2021-10-28 DIAGNOSIS — Z8673 Personal history of transient ischemic attack (TIA), and cerebral infarction without residual deficits: Secondary | ICD-10-CM | POA: Diagnosis not present

## 2021-10-28 DIAGNOSIS — I951 Orthostatic hypotension: Secondary | ICD-10-CM | POA: Diagnosis not present

## 2021-10-28 DIAGNOSIS — G9009 Other idiopathic peripheral autonomic neuropathy: Secondary | ICD-10-CM | POA: Diagnosis not present

## 2021-10-28 DIAGNOSIS — D631 Anemia in chronic kidney disease: Secondary | ICD-10-CM | POA: Diagnosis not present

## 2021-10-28 DIAGNOSIS — Z9981 Dependence on supplemental oxygen: Secondary | ICD-10-CM | POA: Diagnosis not present

## 2021-10-28 DIAGNOSIS — Z950 Presence of cardiac pacemaker: Secondary | ICD-10-CM | POA: Diagnosis not present

## 2021-10-28 DIAGNOSIS — J8410 Pulmonary fibrosis, unspecified: Secondary | ICD-10-CM | POA: Diagnosis not present

## 2021-10-28 DIAGNOSIS — J9610 Chronic respiratory failure, unspecified whether with hypoxia or hypercapnia: Secondary | ICD-10-CM | POA: Diagnosis not present

## 2021-10-28 DIAGNOSIS — Z9181 History of falling: Secondary | ICD-10-CM | POA: Diagnosis not present

## 2021-10-28 DIAGNOSIS — Z96642 Presence of left artificial hip joint: Secondary | ICD-10-CM | POA: Diagnosis not present

## 2021-10-28 DIAGNOSIS — N1831 Chronic kidney disease, stage 3a: Secondary | ICD-10-CM | POA: Diagnosis not present

## 2021-10-28 DIAGNOSIS — Z7982 Long term (current) use of aspirin: Secondary | ICD-10-CM | POA: Diagnosis not present

## 2021-10-28 DIAGNOSIS — S72092D Other fracture of head and neck of left femur, subsequent encounter for closed fracture with routine healing: Secondary | ICD-10-CM | POA: Diagnosis not present

## 2021-10-28 DIAGNOSIS — H5789 Other specified disorders of eye and adnexa: Secondary | ICD-10-CM | POA: Diagnosis not present

## 2021-10-28 DIAGNOSIS — Z7902 Long term (current) use of antithrombotics/antiplatelets: Secondary | ICD-10-CM | POA: Diagnosis not present

## 2021-10-28 DIAGNOSIS — E785 Hyperlipidemia, unspecified: Secondary | ICD-10-CM | POA: Diagnosis not present

## 2021-10-28 DIAGNOSIS — K219 Gastro-esophageal reflux disease without esophagitis: Secondary | ICD-10-CM | POA: Diagnosis not present

## 2021-11-02 DIAGNOSIS — H5789 Other specified disorders of eye and adnexa: Secondary | ICD-10-CM | POA: Diagnosis not present

## 2021-11-02 DIAGNOSIS — N1831 Chronic kidney disease, stage 3a: Secondary | ICD-10-CM | POA: Diagnosis not present

## 2021-11-02 DIAGNOSIS — K219 Gastro-esophageal reflux disease without esophagitis: Secondary | ICD-10-CM | POA: Diagnosis not present

## 2021-11-02 DIAGNOSIS — S72092D Other fracture of head and neck of left femur, subsequent encounter for closed fracture with routine healing: Secondary | ICD-10-CM | POA: Diagnosis not present

## 2021-11-02 DIAGNOSIS — Z9181 History of falling: Secondary | ICD-10-CM | POA: Diagnosis not present

## 2021-11-02 DIAGNOSIS — G9009 Other idiopathic peripheral autonomic neuropathy: Secondary | ICD-10-CM | POA: Diagnosis not present

## 2021-11-02 DIAGNOSIS — I951 Orthostatic hypotension: Secondary | ICD-10-CM | POA: Diagnosis not present

## 2021-11-02 DIAGNOSIS — J9610 Chronic respiratory failure, unspecified whether with hypoxia or hypercapnia: Secondary | ICD-10-CM | POA: Diagnosis not present

## 2021-11-02 DIAGNOSIS — Z7982 Long term (current) use of aspirin: Secondary | ICD-10-CM | POA: Diagnosis not present

## 2021-11-02 DIAGNOSIS — Z950 Presence of cardiac pacemaker: Secondary | ICD-10-CM | POA: Diagnosis not present

## 2021-11-02 DIAGNOSIS — Z96642 Presence of left artificial hip joint: Secondary | ICD-10-CM | POA: Diagnosis not present

## 2021-11-02 DIAGNOSIS — J8410 Pulmonary fibrosis, unspecified: Secondary | ICD-10-CM | POA: Diagnosis not present

## 2021-11-02 DIAGNOSIS — E785 Hyperlipidemia, unspecified: Secondary | ICD-10-CM | POA: Diagnosis not present

## 2021-11-02 DIAGNOSIS — Z9981 Dependence on supplemental oxygen: Secondary | ICD-10-CM | POA: Diagnosis not present

## 2021-11-02 DIAGNOSIS — Z7902 Long term (current) use of antithrombotics/antiplatelets: Secondary | ICD-10-CM | POA: Diagnosis not present

## 2021-11-02 DIAGNOSIS — Z8673 Personal history of transient ischemic attack (TIA), and cerebral infarction without residual deficits: Secondary | ICD-10-CM | POA: Diagnosis not present

## 2021-11-02 DIAGNOSIS — D631 Anemia in chronic kidney disease: Secondary | ICD-10-CM | POA: Diagnosis not present

## 2021-11-03 DIAGNOSIS — M25511 Pain in right shoulder: Secondary | ICD-10-CM | POA: Diagnosis not present

## 2021-11-03 DIAGNOSIS — M25521 Pain in right elbow: Secondary | ICD-10-CM | POA: Diagnosis not present

## 2021-11-04 DIAGNOSIS — J8410 Pulmonary fibrosis, unspecified: Secondary | ICD-10-CM | POA: Diagnosis not present

## 2021-11-04 DIAGNOSIS — J9610 Chronic respiratory failure, unspecified whether with hypoxia or hypercapnia: Secondary | ICD-10-CM | POA: Diagnosis not present

## 2021-11-04 DIAGNOSIS — G9009 Other idiopathic peripheral autonomic neuropathy: Secondary | ICD-10-CM | POA: Diagnosis not present

## 2021-11-04 DIAGNOSIS — Z7982 Long term (current) use of aspirin: Secondary | ICD-10-CM | POA: Diagnosis not present

## 2021-11-04 DIAGNOSIS — N1831 Chronic kidney disease, stage 3a: Secondary | ICD-10-CM | POA: Diagnosis not present

## 2021-11-04 DIAGNOSIS — Z9181 History of falling: Secondary | ICD-10-CM | POA: Diagnosis not present

## 2021-11-04 DIAGNOSIS — D631 Anemia in chronic kidney disease: Secondary | ICD-10-CM | POA: Diagnosis not present

## 2021-11-04 DIAGNOSIS — Z8673 Personal history of transient ischemic attack (TIA), and cerebral infarction without residual deficits: Secondary | ICD-10-CM | POA: Diagnosis not present

## 2021-11-04 DIAGNOSIS — H5789 Other specified disorders of eye and adnexa: Secondary | ICD-10-CM | POA: Diagnosis not present

## 2021-11-04 DIAGNOSIS — E785 Hyperlipidemia, unspecified: Secondary | ICD-10-CM | POA: Diagnosis not present

## 2021-11-04 DIAGNOSIS — Z950 Presence of cardiac pacemaker: Secondary | ICD-10-CM | POA: Diagnosis not present

## 2021-11-04 DIAGNOSIS — I951 Orthostatic hypotension: Secondary | ICD-10-CM | POA: Diagnosis not present

## 2021-11-04 DIAGNOSIS — K219 Gastro-esophageal reflux disease without esophagitis: Secondary | ICD-10-CM | POA: Diagnosis not present

## 2021-11-04 DIAGNOSIS — Z9981 Dependence on supplemental oxygen: Secondary | ICD-10-CM | POA: Diagnosis not present

## 2021-11-04 DIAGNOSIS — Z7902 Long term (current) use of antithrombotics/antiplatelets: Secondary | ICD-10-CM | POA: Diagnosis not present

## 2021-11-04 DIAGNOSIS — Z96642 Presence of left artificial hip joint: Secondary | ICD-10-CM | POA: Diagnosis not present

## 2021-11-04 DIAGNOSIS — S72092D Other fracture of head and neck of left femur, subsequent encounter for closed fracture with routine healing: Secondary | ICD-10-CM | POA: Diagnosis not present

## 2021-11-05 ENCOUNTER — Other Ambulatory Visit: Payer: Self-pay | Admitting: Student in an Organized Health Care Education/Training Program

## 2021-11-05 DIAGNOSIS — M5416 Radiculopathy, lumbar region: Secondary | ICD-10-CM

## 2021-11-06 DIAGNOSIS — E785 Hyperlipidemia, unspecified: Secondary | ICD-10-CM | POA: Diagnosis not present

## 2021-11-06 DIAGNOSIS — J9610 Chronic respiratory failure, unspecified whether with hypoxia or hypercapnia: Secondary | ICD-10-CM | POA: Diagnosis not present

## 2021-11-06 DIAGNOSIS — Z87891 Personal history of nicotine dependence: Secondary | ICD-10-CM | POA: Diagnosis not present

## 2021-11-06 DIAGNOSIS — S72092D Other fracture of head and neck of left femur, subsequent encounter for closed fracture with routine healing: Secondary | ICD-10-CM | POA: Diagnosis not present

## 2021-11-06 DIAGNOSIS — F32A Depression, unspecified: Secondary | ICD-10-CM | POA: Diagnosis not present

## 2021-11-06 DIAGNOSIS — M7062 Trochanteric bursitis, left hip: Secondary | ICD-10-CM | POA: Diagnosis not present

## 2021-11-06 DIAGNOSIS — I951 Orthostatic hypotension: Secondary | ICD-10-CM | POA: Diagnosis not present

## 2021-11-06 DIAGNOSIS — G4733 Obstructive sleep apnea (adult) (pediatric): Secondary | ICD-10-CM | POA: Diagnosis not present

## 2021-11-06 DIAGNOSIS — Z96642 Presence of left artificial hip joint: Secondary | ICD-10-CM | POA: Diagnosis not present

## 2021-11-06 DIAGNOSIS — Z9981 Dependence on supplemental oxygen: Secondary | ICD-10-CM | POA: Diagnosis not present

## 2021-11-06 DIAGNOSIS — M1632 Unilateral osteoarthritis resulting from hip dysplasia, left hip: Secondary | ICD-10-CM | POA: Diagnosis not present

## 2021-11-06 DIAGNOSIS — H5789 Other specified disorders of eye and adnexa: Secondary | ICD-10-CM | POA: Diagnosis not present

## 2021-11-06 DIAGNOSIS — L409 Psoriasis, unspecified: Secondary | ICD-10-CM | POA: Diagnosis not present

## 2021-11-06 DIAGNOSIS — G8929 Other chronic pain: Secondary | ICD-10-CM | POA: Diagnosis not present

## 2021-11-06 DIAGNOSIS — K219 Gastro-esophageal reflux disease without esophagitis: Secondary | ICD-10-CM | POA: Diagnosis not present

## 2021-11-06 DIAGNOSIS — G9009 Other idiopathic peripheral autonomic neuropathy: Secondary | ICD-10-CM | POA: Diagnosis not present

## 2021-11-06 DIAGNOSIS — Z950 Presence of cardiac pacemaker: Secondary | ICD-10-CM | POA: Diagnosis not present

## 2021-11-06 DIAGNOSIS — Z7902 Long term (current) use of antithrombotics/antiplatelets: Secondary | ICD-10-CM | POA: Diagnosis not present

## 2021-11-06 DIAGNOSIS — Z9181 History of falling: Secondary | ICD-10-CM | POA: Diagnosis not present

## 2021-11-06 DIAGNOSIS — J8410 Pulmonary fibrosis, unspecified: Secondary | ICD-10-CM | POA: Diagnosis not present

## 2021-11-06 DIAGNOSIS — M25521 Pain in right elbow: Secondary | ICD-10-CM | POA: Diagnosis not present

## 2021-11-06 DIAGNOSIS — Z8673 Personal history of transient ischemic attack (TIA), and cerebral infarction without residual deficits: Secondary | ICD-10-CM | POA: Diagnosis not present

## 2021-11-06 DIAGNOSIS — M7542 Impingement syndrome of left shoulder: Secondary | ICD-10-CM | POA: Diagnosis not present

## 2021-11-06 DIAGNOSIS — D631 Anemia in chronic kidney disease: Secondary | ICD-10-CM | POA: Diagnosis not present

## 2021-11-06 DIAGNOSIS — D649 Anemia, unspecified: Secondary | ICD-10-CM | POA: Diagnosis not present

## 2021-11-06 DIAGNOSIS — N1831 Chronic kidney disease, stage 3a: Secondary | ICD-10-CM | POA: Diagnosis not present

## 2021-11-06 DIAGNOSIS — Z7982 Long term (current) use of aspirin: Secondary | ICD-10-CM | POA: Diagnosis not present

## 2021-11-09 DIAGNOSIS — M25521 Pain in right elbow: Secondary | ICD-10-CM | POA: Insufficient documentation

## 2021-11-11 ENCOUNTER — Telehealth: Payer: Self-pay | Admitting: Family

## 2021-11-11 NOTE — Telephone Encounter (Signed)
Sherlyn Lick from [facility] called in requesting a verbal order for occupational therapy for [name] is requesting callback at [phone]  Verbal order is for: Occupational Therapy OT Extension Order Freq: [phone]

## 2021-11-12 ENCOUNTER — Encounter: Payer: Self-pay | Admitting: Nurse Practitioner

## 2021-11-12 ENCOUNTER — Other Ambulatory Visit: Payer: Self-pay

## 2021-11-12 ENCOUNTER — Other Ambulatory Visit: Payer: Medicare Other

## 2021-11-12 ENCOUNTER — Telehealth (INDEPENDENT_AMBULATORY_CARE_PROVIDER_SITE_OTHER): Payer: Medicare Other | Admitting: Nurse Practitioner

## 2021-11-12 DIAGNOSIS — R051 Acute cough: Secondary | ICD-10-CM | POA: Diagnosis not present

## 2021-11-12 DIAGNOSIS — R197 Diarrhea, unspecified: Secondary | ICD-10-CM | POA: Diagnosis not present

## 2021-11-12 DIAGNOSIS — R0981 Nasal congestion: Secondary | ICD-10-CM

## 2021-11-12 DIAGNOSIS — J069 Acute upper respiratory infection, unspecified: Secondary | ICD-10-CM | POA: Insufficient documentation

## 2021-11-12 DIAGNOSIS — J8410 Pulmonary fibrosis, unspecified: Secondary | ICD-10-CM | POA: Diagnosis not present

## 2021-11-12 DIAGNOSIS — R0602 Shortness of breath: Secondary | ICD-10-CM | POA: Diagnosis not present

## 2021-11-12 LAB — POCT INFLUENZA A/B
Influenza A, POC: NEGATIVE
Influenza B, POC: NEGATIVE

## 2021-11-12 LAB — POC COVID19 BINAXNOW: SARS Coronavirus 2 Ag: NEGATIVE

## 2021-11-12 MED ORDER — DOXYCYCLINE HYCLATE 100 MG PO TABS: 100.0000 mg | ORAL_TABLET | Freq: Two times a day (BID) | ORAL | 0 refills | Status: AC

## 2021-11-12 MED ORDER — PREDNISONE 20 MG PO TABS: ORAL_TABLET | ORAL | 0 refills | Status: AC

## 2021-11-12 NOTE — Assessment & Plan Note (Signed)
Continue over-the-counter treatment for cough currently.

## 2021-11-12 NOTE — Assessment & Plan Note (Signed)
Patient endorsing some shortness of breath has not been relieved with inhalers.  She does have chronic respiratory failure with hypoxia along with interstitial lung disease.  Patient does wear oxygen at home all the time.  We will have her tested for COVID and flu.  Send in a steroid dose taper and if viral testing negative likely cover patient with antibiotics.  Information reviewed to seek urgent or emergent health care.

## 2021-11-12 NOTE — Progress Notes (Signed)
Patient ID: Melody Ochoa, female    DOB: [birth year], 80 y.o.   MRN: [id number]  Virtual visit completed through [place], a video enabled telemedicine application. Due to national recommendations of social distancing due to COVID-19, a virtual visit is felt to be most appropriate for this patient at this time. Reviewed limitations, risks, security and privacy concerns of performing a virtual visit and the availability of in person appointments. I also reviewed that there may be a patient responsible charge related to this service. The patient agreed to proceed.   Phone call 11 mins and 13 seconds  Patient location: home Provider location: [place] at [facility], office Persons participating in this virtual visit: patient, provider   If any vitals were documented, they were collected by patient at home unless specified below.    There were no vitals taken for this visit.   CC: Cough Subjective:   HPI: Melody Ochoa is a 80 y.o. female presenting on 11/12/2021 for Cough (Sx started on 11/10/21. Chest congestion, some green or white phlegm will come up. No sore throat, headache, body aches, fever or chills. Has been taking Advil, Robitussin. )  Symptoms started on 11/08/2021, worse on the 11/10/2021 Covid tested last week on friday 11/06/2021 Has been vaccinated against covid Sick contacts include: Husband who had covid but they do not stay together Have been taking advil and robitussin at home, some relief   SHOB that inhaler does not seem to help 2.5 liters of o2 at home all the time   Relevant past medical, surgical, family and social history reviewed and updated as indicated. Interim medical history since our last visit reviewed. Allergies and medications reviewed and updated. Outpatient Medications Prior to Visit  Medication Sig Dispense Refill   acetaminophen (TYLENOL) 500 MG tablet Take 1-2 tablets (500-1,000 mg total) by mouth every 6 (six) hours as needed. 60 tablet  0   albuterol (VENTOLIN HFA) 108 (90 Base) MCG/ACT inhaler TAKE 2 PUFFS BY MOUTH EVERY 6 HOURS AS NEEDED FOR WHEEZE OR SHORTNESS OF BREATH 8.5 each 2   atorvastatin (LIPITOR) 80 MG tablet Take 1 tablet (80 mg total) by mouth daily. 90 tablet 0   buPROPion (WELLBUTRIN XL) 300 MG 24 hr tablet TAKE ONE TABLET BY MOUTH EVERY DAY 90 tablet 1   clopidogrel (PLAVIX) 75 MG tablet Take 1 tablet (75 mg total) by mouth daily. 90 tablet 3   docusate sodium (COLACE) 100 MG capsule Take 100 mg by mouth 2 (two) times daily.     DULoxetine (CYMBALTA) 30 MG capsule TAKE 1 CAPSULE BY MOUTH EVERY DAY 90 capsule 1   famotidine (PEPCID) 20 MG tablet TAKE ONE TABLET BY MOUTH AT BEDTIME 90 tablet 1   feeding supplement (ENSURE ENLIVE / ENSURE PLUS) LIQD Take 237 mLs by mouth 3 (three) times daily between meals. 14220 mL 0   ferrous sulfate 325 (65 FE) MG tablet Take 1 tablet (325 mg total) by mouth daily. 30 tablet 0   fluticasone (FLONASE) 50 MCG/ACT nasal spray SPRAY 2 SPRAYS INTO EACH NOSTRIL EVERY DAY 48 mL 1   pregabalin (LYRICA) 50 MG capsule TAKE 1 CAPSULE (50 MG) BY MOUTH EVERY DAY AND TAKE 2 CAPSULES (100 MG) AT BEDTIME. Max daily dose 150mg /day. 90 capsule 1   docusate sodium (COLACE) 100 MG capsule Take 1 capsule (100 mg total) by mouth 2 (two) times daily. 60 capsule 0   menthol-cetylpyridinium (CEPACOL) 3 MG lozenge Take 1 lozenge (3 mg total) by mouth  as needed for sore throat. 15 tablet 12   No facility-administered medications prior to visit.     Per HPI unless specifically indicated in ROS section below Review of Systems  Constitutional:  Negative for chills, fatigue and fever.  HENT:  Positive for congestion. Negative for ear discharge, ear pain, sinus pressure, sinus pain and sore throat.   Respiratory:  Positive for cough (Thick and white or green). Negative for shortness of breath.   Cardiovascular:  Negative for chest pain.  Gastrointestinal:  Positive for diarrhea. Negative for abdominal  pain, nausea and vomiting.  Musculoskeletal:  Negative for arthralgias and myalgias.  Neurological:  Negative for headaches.  Objective:  There were no vitals taken for this visit.  Wt Readings from Last 3 Encounters:  10/12/21 162 lb (73.5 kg)  10/02/21 162 lb 14.7 oz (73.9 kg)  09/21/21 163 lb (73.9 kg)       Physical exam: Gen: alert, NAD, not ill appearing Pulm: speaks in complete sentences without increased work of breathing Psych: normal mood, normal thought content      Results for orders placed or performed in visit on 10/14/21  Urine Culture   Specimen: Urine   Urine  Result Value Ref Range   Urine Culture, Routine Final report    Organism ID, Bacteria Comment   Urinalysis, Routine w reflex microscopic  Result Value Ref Range   Specific Gravity, UA 1.024 1.005 - 1.030   pH, UA 6.5 5.0 - 7.5   Color, UA Yellow Yellow   Appearance Ur Clear Clear   Leukocytes,UA Negative Negative   Protein,UA Negative Negative/Trace   Glucose, UA Negative Negative   Ketones, UA Negative Negative   RBC, UA Negative Negative   Bilirubin, UA Negative Negative   Urobilinogen, Ur 1.0 0.2 - 1.0 mg/dL   Nitrite, UA Negative Negative   Microscopic Examination Comment    Assessment & Plan:   Problem List Items Addressed This Visit       Respiratory   Upper respiratory tract infection    SignsGiven patient's history and comorbidities will treat aggressively in regards to antibiotic use.  We will use doxycycline 100 mg twice daily for 7 days patient tolerated that well in the past.  Azithromycin was a consideration patient's QTC is elongated.  Symptoms when to seek urgent or emergent health care patient acknowledged.  Follow-up if symptoms fail to improve or worsen.      Relevant Medications   doxycycline (VIBRA-TABS) 100 MG tablet     Other   Cough - Primary    Continue over-the-counter treatment for cough currently.      Relevant Orders   Influenza A/B (Completed)   POC  COVID-19 (Completed)   Shortness of breath    Patient endorsing some shortness of breath has not been relieved with inhalers.  She does have chronic respiratory failure with hypoxia along with interstitial lung disease.  Patient does wear oxygen at home all the time.  We will have her tested for COVID and flu.  Send in a steroid dose taper and if viral testing negative likely cover patient with antibiotics.  Information reviewed to seek urgent or emergent health care.      Relevant Medications   predniSONE (DELTASONE) 20 MG tablet   Nasal congestion   Diarrhea     No orders of the defined types were placed in this encounter.  No orders of the defined types were placed in this encounter.   I discussed the assessment and treatment  plan with the patient. The patient was provided an opportunity to ask questions and all were answered. The patient agreed with the plan and demonstrated an understanding of the instructions. The patient was advised to call back or seek an in-person evaluation if the symptoms worsen or if the condition fails to improve as anticipated.  Follow up plan: No follow-ups on file.  Romilda Garret, NP

## 2021-11-12 NOTE — Assessment & Plan Note (Signed)
SignsGiven patient's history and comorbidities will treat aggressively in regards to antibiotic use.  We will use doxycycline 100 mg twice daily for 7 days patient tolerated that well in the past.  Azithromycin was a consideration patient's QTC is elongated.  Symptoms when to seek urgent or emergent health care patient acknowledged.  Follow-up if symptoms fail to improve or worsen.

## 2021-11-12 NOTE — Telephone Encounter (Signed)
I called and gave verbal orders to [facility] to extend patient's OT.

## 2021-11-13 ENCOUNTER — Telehealth: Payer: Medicare Other | Admitting: Internal Medicine

## 2021-11-14 DIAGNOSIS — M6281 Muscle weakness (generalized): Secondary | ICD-10-CM | POA: Diagnosis not present

## 2021-11-17 ENCOUNTER — Ambulatory Visit (INDEPENDENT_AMBULATORY_CARE_PROVIDER_SITE_OTHER): Payer: Medicare Other | Admitting: Pulmonary Disease

## 2021-11-17 ENCOUNTER — Other Ambulatory Visit: Payer: Self-pay

## 2021-11-17 ENCOUNTER — Emergency Department: Payer: Medicare Other

## 2021-11-17 ENCOUNTER — Telehealth: Payer: Self-pay

## 2021-11-17 ENCOUNTER — Encounter: Payer: Self-pay | Admitting: Pulmonary Disease

## 2021-11-17 ENCOUNTER — Emergency Department
Admission: EM | Admit: 2021-11-17 | Discharge: 2021-11-17 | Disposition: A | Discharge status: Home / Self Care | Payer: Medicare Other | Attending: Emergency Medicine | Admitting: Emergency Medicine

## 2021-11-17 DIAGNOSIS — J9611 Chronic respiratory failure with hypoxia: Secondary | ICD-10-CM | POA: Diagnosis not present

## 2021-11-17 DIAGNOSIS — Z7951 Long term (current) use of inhaled steroids: Secondary | ICD-10-CM | POA: Diagnosis not present

## 2021-11-17 DIAGNOSIS — J9801 Acute bronchospasm: Secondary | ICD-10-CM

## 2021-11-17 DIAGNOSIS — Z85828 Personal history of other malignant neoplasm of skin: Secondary | ICD-10-CM | POA: Insufficient documentation

## 2021-11-17 DIAGNOSIS — Z96643 Presence of artificial hip joint, bilateral: Secondary | ICD-10-CM | POA: Diagnosis not present

## 2021-11-17 DIAGNOSIS — R9431 Abnormal electrocardiogram [ECG] [EKG]: Secondary | ICD-10-CM | POA: Diagnosis not present

## 2021-11-17 DIAGNOSIS — J8410 Pulmonary fibrosis, unspecified: Secondary | ICD-10-CM

## 2021-11-17 DIAGNOSIS — N1831 Chronic kidney disease, stage 3a: Secondary | ICD-10-CM | POA: Diagnosis not present

## 2021-11-17 DIAGNOSIS — Z87891 Personal history of nicotine dependence: Secondary | ICD-10-CM | POA: Diagnosis not present

## 2021-11-17 DIAGNOSIS — J449 Chronic obstructive pulmonary disease, unspecified: Secondary | ICD-10-CM | POA: Diagnosis not present

## 2021-11-17 DIAGNOSIS — I129 Hypertensive chronic kidney disease with stage 1 through stage 4 chronic kidney disease, or unspecified chronic kidney disease: Secondary | ICD-10-CM | POA: Diagnosis not present

## 2021-11-17 DIAGNOSIS — Z91040 Latex allergy status: Secondary | ICD-10-CM | POA: Diagnosis not present

## 2021-11-17 DIAGNOSIS — Z7902 Long term (current) use of antithrombotics/antiplatelets: Secondary | ICD-10-CM | POA: Diagnosis not present

## 2021-11-17 DIAGNOSIS — J9621 Acute and chronic respiratory failure with hypoxia: Secondary | ICD-10-CM

## 2021-11-17 DIAGNOSIS — R911 Solitary pulmonary nodule: Secondary | ICD-10-CM

## 2021-11-17 DIAGNOSIS — R0602 Shortness of breath: Secondary | ICD-10-CM | POA: Diagnosis not present

## 2021-11-17 LAB — CBC
HCT: 30 % — ABNORMAL LOW (ref 36.0–46.0)
Hemoglobin: 9.1 g/dL — ABNORMAL LOW (ref 12.0–15.0)
MCH: 26 pg (ref 26.0–34.0)
MCHC: 30.3 g/dL (ref 30.0–36.0)
MCV: 85.7 fL (ref 80.0–100.0)
Platelets: 295 10*3/uL (ref 150–400)
RBC: 3.5 MIL/uL — ABNORMAL LOW (ref 3.87–5.11)
RDW: 15.5 % (ref 11.5–15.5)
WBC: 9.7 10*3/uL (ref 4.0–10.5)
nRBC: 0 % (ref 0.0–0.2)

## 2021-11-17 LAB — BASIC METABOLIC PANEL
Anion gap: 7 (ref 5–15)
BUN: 14 mg/dL (ref 8–23)
CO2: 26 mmol/L (ref 22–32)
Calcium: 8.5 mg/dL — ABNORMAL LOW (ref 8.9–10.3)
Chloride: 103 mmol/L (ref 98–111)
Creatinine, Ser: 0.89 mg/dL (ref 0.44–1.00)
GFR, Estimated: >60 mL/min (ref >60)
Glucose, Bld: 135 mg/dL — ABNORMAL HIGH (ref 70–99)
Potassium: 3.7 mmol/L (ref 3.5–5.1)
Sodium: 136 mmol/L (ref 135–145)

## 2021-11-17 MED ORDER — IPRATROPIUM-ALBUTEROL 0.5-2.5 (3) MG/3ML IN SOLN: 3.0000 mL | Freq: Four times a day (QID) | RESPIRATORY_TRACT | 11 refills | Status: DC | PRN

## 2021-11-17 NOTE — TOC Initial Note (Signed)
Transition of Care [facility]) - Initial/Assessment Note    Patient Details  Name: Melody Ochoa MRN: [id number] Date of Birth: 08-16-1941  Transition of Care [facility]) CM/SW Contact:    Shelbie Hutching, RN Phone Number: 11/17/2021, 4:02 PM  Clinical Narrative:                 Patient sent from Dr. Domingo Dimes office to the ED this morning after arriving with no portable oxygen and sats of 43%.   [name].  Her oxygen is from [place].  Patient against medical advice returned her portable tanks back in May saying she never went anywhere.  [facility] needs a new order for the oxygen the patient's order has expired.  MD will write oxygen orders.  Caryl Pina at [place] is sending his delivery driver over now to deliver oxygen and a nebulizer so patient can get home.  [facility] will follow up with patient at home.    Expected Discharge Plan: Home/Self Care Barriers to Discharge: Equipment Delay   Patient Goals and CMS Choice Patient states their goals for this hospitalization and ongoing recovery are:: wants smaller portable tanks CMS Medicare.gov Compare Post Acute Care list provided to:: Patient Choice offered to / list presented to : Patient  Expected Discharge Plan and Services Expected Discharge Plan: Home/Self Care   Discharge Planning Services: CM Consult Post Acute Care Choice: Durable Medical Equipment                   DME Arranged: Oxygen DME Agency: [employer] [facility] Date DME Agency Contacted: 11/17/21 Time DME Agency Contacted: [id number] Representative spoke with at DME Agency: [place]: NA [place] Agency: NA        Prior Living Arrangements/Services     Patient language and need for interpreter reviewed:: Yes Do you feel safe going back to the place where you live?: Yes          Current home services: DME (oxygen from [place]) Criminal Activity/Legal Involvement Pertinent to Current Situation/Hospitalization: No - Comment as needed  Activities  of Daily Living      Permission Sought/Granted Permission sought to share information with : Other (comment) Permission granted to share information with : Yes, Verbal Permission Granted  Share Information with NAME: [facility]           Emotional Assessment       Orientation: : Oriented to Self, Oriented to Place, Oriented to  Time, Oriented to Situation Alcohol / Substance Use: Not Applicable Psych Involvement: No (comment)  Admission diagnosis:  low oxygen levels, shob Patient Active Problem List   Diagnosis Date Noted   Nasal congestion 11/12/2021   Diarrhea 11/12/2021   Upper respiratory tract infection 11/12/2021   Weakness    Confusion    Hypotension    Acute blood loss anemia    Irritation of left eye    Acute respiratory failure with hypoxia (HCC)    Interstitial lung disease (HCC)    Gastroesophageal reflux disease without esophagitis    Leg swelling 07/13/2021   Intercostal neuralgia 04/16/2021   Multiple closed fractures of ribs of left side 04/16/2021   Adenoma of left adrenal gland 12/25/2020   Ischemic stroke ([place]) 12/10/2020   Chronic kidney disease, stage 3a ([place]) 12/09/2020   Chronic respiratory failure with hypoxia ([place]) 12/09/2020   Fall at home, initial encounter 12/09/2020   Right shoulder pain 12/09/2020   Memory deficit 09/24/2020   Pulmonary fibrosis ([place]) 09/14/2020  Chronic cough 09/14/2020   Arthritis of left sacroiliac joint 08/26/2020   Chronic left SI joint pain 08/26/2020   CKD (chronic kidney disease) 07/23/2020   HTN (hypertension) 04/23/2020   Positive fecal immunochemical test    Angiodysplasia of intestinal tract    Transient ischemic attack (TIA) 10/08/2019   Transient right leg weakness 09/30/2019   Peripheral neuropathy 08/10/2019   Primary osteoarthritis of left shoulder 07/24/2019   Elevated blood pressure reading 06/27/2019   Left shoulder pain 05/04/2019   Anemia 03/19/2019   Long term current use of opiate analgesic  01/09/2019   Foraminal stenosis of lumbar region 11/14/2018   Chronic pain syndrome 09/12/2018   Shortness of breath 08/11/2018   Dysphagia 08/11/2018   Cough 06/30/2018   Rash 01/16/2018   Hyperlipidemia 10/18/2017   History of lumbar surgery 10/04/2017   Depression, recurrent ([place]) 09/27/2017   Lung nodule 09/27/2017   History of CVA (cerebrovascular accident) 09/12/2017   Cerebellar stroke, acute ([place]) 08/10/2017   Primary osteoarthritis of left hip 02/28/2017   Strain of left hip 02/28/2017   B12 deficiency 12/21/2016   Generalized OA 05/13/2016   Status post total hip replacement, left 04/22/2016   Personal history of colonic polyps    Greater trochanteric bursitis of both hips 01/09/2016   Prediabetes 08/04/2015   Thoracic aortic atherosclerosis ([place]) 06/16/2015   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   UTI (lower urinary tract infection) 11/22/2014   Low back pain 11/22/2014   Spondylolisthesis of lumbar region 11/18/2014   Lumbar radiculopathy 08/21/2014   Degeneration of lumbar or lumbosacral intervertebral disc 08/21/2014   Acute hip pain, left 08/21/2014   Osteopenia 06/23/2014   Major depression in remission ([place]) 06/23/2014   Anxiety 06/23/2014   Menopause 06/23/2014   PCP:  Burnard Hawthorne, FNP Pharmacy:   [place] [place] [address] [place] [address] Phone: [phone] Fax: [phone]     Social Determinants of Health (SDOH) Interventions    Readmission Risk Interventions Readmission Risk Prevention Plan 06/15/2019  Post Dischage Appt Complete  Medication Screening Complete  Transportation Screening Complete  Some recent data might be hidden

## 2021-11-17 NOTE — Telephone Encounter (Signed)
Patient arrived to office for OV. sats of 43% on roomair upon arrival. According to our records, patient should be on 2L cont with ml6 tanks for travel. Spoke to [name] with [facility], who stated that patient called 04/2021 and requested for ml6 tanks to be picked up. Patient signed refusal form at that time.   Routing to Dr. Patsey Berthold as an Juluis Rainier.

## 2021-11-17 NOTE — Telephone Encounter (Signed)
Noted  

## 2021-11-17 NOTE — ED Triage Notes (Signed)
Pt here with SOB due to her oxygen coming out last night while she was asleep. Pt also did not have portable oxygen to go to her appt this morning. Pt went to [facility] for a follow up visit when her low oxygen levels were discovered. Pt now on oxygen via [place] and pt stable. Pt in NAD in triage.

## 2021-11-17 NOTE — Plan of Care (Signed)
Please see note from [facility] today.  I also saw the patient in the ED at the request of Dr. Joni Fears.  Patient desires to go home.  She had been previously qualified for oxygen however she returned all of her portable sources of oxygen and may [place].  She is now willing to reconsider this.  DME is [facility].  She has stabilized after her initial event in the clinic this morning where her oxygen saturations were 43% on room air and she was in respiratory distress.  She has significant end-stage pulmonary fibrosis.  I recommend to document oxygen saturations with oxygen during ambulation and determine what amount of oxygen she requires.  Hopefully then she can get a portable source of oxygen by [facility] to go home with.  Patient also notes that she has issues with wheezing at home we will provide her with a nebulizer unit and nebulizer solution.  This has been ordered.  We will see her in follow-up in 4 to 6 weeks time.  Patient will be notified of the follow-up appointment.  We also initiated end-of-life discussions today.  Recommend that the patient be at least evaluated by palliative care.  Patient is considering this.  Renold Don, MD Advanced Bronchoscopy PCCM [facility]    *This note was dictated using voice recognition software/Dragon.  Despite best efforts to proofread, errors can occur which can change the meaning. Any transcriptional errors that result from this process are unintentional and may not be fully corrected at the time of dictation.

## 2021-11-17 NOTE — ED Provider Notes (Signed)
[facility] Emergency Department Provider Note  ____________________________________________  Time seen: Approximately 3:30 PM  I have reviewed the triage vital signs and the nursing notes.   HISTORY  Chief Complaint Shortness of Breath    HPI Melody Ochoa is a 80 y.o. female with a history of GERD, COPD on 2 L nasal cannula at all times who was sent to the ED today due to hypoxia on room air.  The patient has previously been evaluated and determined to need oxygen at all times.  At home when she woke up this morning, her oxygen had fallen off and her room air oxygen saturation was 70%.  She felt short of breath, but when they put her oxygen back on her, her level quickly returned to normal and she felt back to normal.  Incidentally this morning she also had an appointment in pulmonology clinic to be evaluated for a pulmonary nodule.  While ambulating in the clinic without oxygen, her saturation was found to be 49%, so she was sent to the ED immediately for evaluation.  Currently on oxygen, she feels normal and denies any acute symptoms.  No recent illness.  No chest pain or cough.  She is already on doxycycline prescribed by her primary care doctor less than a week ago.    Past Medical History:  Diagnosis Date   Arthritis    Atheromatous plaque 10/18/2017   Back pain    Cancer ([place])    skin   Depression    Diverticulosis    Dizziness    patient had episode of dizziness when came in room. hx past no dx   GERD (gastroesophageal reflux disease)    Heart murmur    Hyperlipidemia 10/18/2017   PONV (postoperative nausea and vomiting)    yrs ago   Squamous cell carcinoma of skin 11/16/2016   L chest parasternal   Squamous cell carcinoma of skin 02/05/2020   L cheek inf to lat zygoma   Stroke [facility])      Patient Active Problem List   Diagnosis Date Noted   Nasal congestion 11/12/2021   Diarrhea 11/12/2021   Upper respiratory tract infection  11/12/2021   Weakness    Confusion    Hypotension    Acute blood loss anemia    Irritation of left eye    Acute respiratory failure with hypoxia (HCC)    Interstitial lung disease (HCC)    Gastroesophageal reflux disease without esophagitis    Leg swelling 07/13/2021   Intercostal neuralgia 04/16/2021   Multiple closed fractures of ribs of left side 04/16/2021   Adenoma of left adrenal gland 12/25/2020   Ischemic stroke ([place]) 12/10/2020   Chronic kidney disease, stage 3a ([place]) 12/09/2020   Chronic respiratory failure with hypoxia ([place]) 12/09/2020   Fall at home, initial encounter 12/09/2020   Right shoulder pain 12/09/2020   Memory deficit 09/24/2020   Pulmonary fibrosis ([place]) 09/14/2020   Chronic cough 09/14/2020   Arthritis of left sacroiliac joint 08/26/2020   Chronic left SI joint pain 08/26/2020   CKD (chronic kidney disease) 07/23/2020   HTN (hypertension) 04/23/2020   Positive fecal immunochemical test    Angiodysplasia of intestinal tract    Transient ischemic attack (TIA) 10/08/2019   Transient right leg weakness 09/30/2019   Peripheral neuropathy 08/10/2019   Primary osteoarthritis of left shoulder 07/24/2019   Elevated blood pressure reading 06/27/2019   Left shoulder pain 05/04/2019   Anemia 03/19/2019   Long term current use of opiate  analgesic 01/09/2019   Foraminal stenosis of lumbar region 11/14/2018   Chronic pain syndrome 09/12/2018   Shortness of breath 08/11/2018   Dysphagia 08/11/2018   Cough 06/30/2018   Rash 01/16/2018   Hyperlipidemia 10/18/2017   History of lumbar surgery 10/04/2017   Depression, recurrent ([place]) 09/27/2017   Lung nodule 09/27/2017   History of CVA (cerebrovascular accident) 09/12/2017   Cerebellar stroke, acute ([place]) 08/10/2017   Primary osteoarthritis of left hip 02/28/2017   Strain of left hip 02/28/2017   B12 deficiency 12/21/2016   Generalized OA 05/13/2016   Status post total hip replacement, left 04/22/2016   Personal  history of colonic polyps    Greater trochanteric bursitis of both hips 01/09/2016   Prediabetes 08/04/2015   Thoracic aortic atherosclerosis ([place]) 06/16/2015   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   UTI (lower urinary tract infection) 11/22/2014   Low back pain 11/22/2014   Spondylolisthesis of lumbar region 11/18/2014   Lumbar radiculopathy 08/21/2014   Degeneration of lumbar or lumbosacral intervertebral disc 08/21/2014   Acute hip pain, left 08/21/2014   Osteopenia 06/23/2014   Major depression in remission ([place]) 06/23/2014   Anxiety 06/23/2014   Menopause 06/23/2014     Past Surgical History:  Procedure Laterality Date   BACK SURGERY  13   BREAST BIOPSY Bilateral    cores "years ago"   BREAST EXCISIONAL BIOPSY Left 90's   CATARACT EXTRACTION W/PHACO Left 12/20/2018   Procedure: CATARACT EXTRACTION PHACO AND INTRAOCULAR LENS PLACEMENT ([place]) LEFT TOPICAL;  Surgeon: Leandrew Koyanagi, MD;  Location: [place];  Service: Ophthalmology;  Laterality: Left;   CATARACT EXTRACTION W/PHACO Right 01/24/2019   Procedure: CATARACT EXTRACTION PHACO AND INTRAOCULAR LENS PLACEMENT ([place]) RIGHT;  Surgeon: Leandrew Koyanagi, MD;  Location: [place];  Service: Ophthalmology;  Laterality: Right;   CERVICAL DISC SURGERY  09   COLONOSCOPY WITH PROPOFOL N/A 01/13/2016   Procedure: COLONOSCOPY WITH PROPOFOL;  Surgeon: [name], MD;  Location: [facility] ENDOSCOPY;  Service: Endoscopy;  Laterality: N/A;   COLONOSCOPY WITH PROPOFOL N/A 02/19/2020   Procedure: COLONOSCOPY WITH PROPOFOL;  Surgeon: [name], MD;  Location: [facility]COPY;  Service: Gastroenterology;  Laterality: N/A;   FACELIFT  94   LOOP RECORDER INSERTION N/A 08/23/2017   Procedure: LOOP RECORDER INSERTION;  Surgeon: Thompson Grayer, MD;  Location: [place] CV LAB;  Service: Cardiovascular;  Laterality: N/A;   TEE WITHOUT CARDIOVERSION N/A 08/12/2017   Procedure: TRANSESOPHAGEAL ECHOCARDIOGRAM (TEE);  Surgeon:  Sueanne Margarita, MD;  Location: [facility] ENDOSCOPY;  Service: Cardiovascular;  Laterality: N/A;   TONSILLECTOMY     TOTAL HIP ARTHROPLASTY Right 04/22/2016   Procedure: TOTAL HIP ARTHROPLASTY;  Surgeon: Corky Mull, MD;  Location: [facility] ORS;  Service: Orthopedics;  Laterality: Right;   TOTAL HIP ARTHROPLASTY Left 08/05/2021   Procedure: TOTAL HIP ARTHROPLASTY;  Surgeon: Corky Mull, MD;  Location: [facility] ORS;  Service: Orthopedics;  Laterality: Left;     Prior to Admission medications   Medication Sig Start Date End Date Taking? Authorizing Provider  acetaminophen (TYLENOL) 500 MG tablet Take 1-2 tablets (500-1,000 mg total) by mouth every 6 (six) hours as needed. 08/07/21   Lattie Corns, PA-C  albuterol (VENTOLIN HFA) 108 (90 Base) MCG/ACT inhaler TAKE 2 PUFFS BY MOUTH EVERY 6 HOURS AS NEEDED FOR WHEEZE OR SHORTNESS OF BREATH 09/21/21   Burnard Hawthorne, FNP  atorvastatin (LIPITOR) 80 MG tablet Take 1 tablet (80 mg total) by mouth daily. 08/11/21   Burnard Hawthorne,  FNP  buPROPion (WELLBUTRIN XL) 300 MG 24 hr tablet TAKE ONE TABLET BY MOUTH EVERY DAY 05/11/21   Burnard Hawthorne, FNP  clopidogrel (PLAVIX) 75 MG tablet Take 1 tablet (75 mg total) by mouth daily. 02/06/21   Burnard Hawthorne, FNP  docusate sodium (COLACE) 100 MG capsule Take 100 mg by mouth 2 (two) times daily.    [provider]  doxycycline (VIBRA-TABS) 100 MG tablet Take 1 tablet (100 mg total) by mouth 2 (two) times daily for 7 days. 11/12/21 11/19/21  Michela Pitcher, NP  DULoxetine (CYMBALTA) 30 MG capsule TAKE 1 CAPSULE BY MOUTH EVERY DAY 05/11/21   Burnard Hawthorne, FNP  famotidine (PEPCID) 20 MG tablet TAKE ONE TABLET BY MOUTH AT BEDTIME 05/11/21   Burnard Hawthorne, FNP  feeding supplement (ENSURE ENLIVE / ENSURE PLUS) LIQD Take 237 mLs by mouth 3 (three) times daily between meals. 08/07/21   Loletha Grayer, MD  ferrous sulfate 325 (65 FE) MG tablet Take 1 tablet (325 mg total) by mouth daily. 08/11/21 11/12/21   Loletha Grayer, MD  fluticasone (FLONASE) 50 MCG/ACT nasal spray SPRAY 2 SPRAYS INTO EACH NOSTRIL EVERY DAY 09/21/21   Burnard Hawthorne, FNP  predniSONE (DELTASONE) 20 MG tablet Take 1 tablet (20 mg total) by mouth 2 (two) times daily with a meal for 3 days, THEN 1 tablet (20 mg total) daily with breakfast for 3 days. 11/12/21 11/18/21  Michela Pitcher, NP  pregabalin (LYRICA) 50 MG capsule TAKE 1 CAPSULE (50 MG) BY MOUTH EVERY DAY AND TAKE 2 CAPSULES (100 MG) AT BEDTIME. Max daily dose 150mg /day. 06/10/21   Gillis Santa, MD     Allergies Latex, Aleve [naproxen sodium], Aspirin, Celebrex [celecoxib], Effexor [venlafaxine], Gabapentin, Hydrocodone bit-homatrop mbr, Ibuprofen, Mobic [meloxicam], Tape, Vioxx [rofecoxib], Other, and Tramadol   Family History  Problem Relation Age of Onset   Lung cancer Father    Aneurysm Brother    Stroke Brother    Diabetes Maternal Grandmother    Kidney disease Maternal Grandfather    Cushing syndrome Paternal Grandmother    Dementia Paternal Grandfather    Breast cancer Maternal Aunt 52    Social History Social History   Tobacco Use   Smoking status: Former    Packs/day: 0.75    Years: 40.00    Pack years: 30.00    Types: Cigarettes    Quit date: 11/13/1996    Years since quitting: 25.0   Smokeless tobacco: Never  Vaping Use   Vaping Use: Never used  Substance Use Topics   Alcohol use: Yes    Comment: occ wine   Drug use: No    Review of Systems  Constitutional:   No fever or chills.  ENT:   No sore throat. No rhinorrhea. Cardiovascular:   No chest pain or syncope. Respiratory:   No dyspnea or cough. Gastrointestinal:   Negative for abdominal pain, vomiting and diarrhea.  Musculoskeletal:   Negative for focal pain or swelling All other systems reviewed and are negative except as documented above in ROS and HPI.  ____________________________________________   PHYSICAL EXAM:  VITAL SIGNS: ED Triage Vitals  Enc Vitals Group      BP 11/17/21 1151 126/71     Pulse Rate 11/17/21 1151 86     Resp 11/17/21 1151 16     Temp 11/17/21 1151 97.9 F (36.6 C)     Temp Source 11/17/21 1151 Oral     SpO2 11/17/21 1151 97 %  Weight 11/17/21 1152 176 lb (79.8 kg)     Height 11/17/21 1152 5\' 4"  (1.626 m)     Head Circumference --      Peak Flow --      Pain Score 11/17/21 1151 0     Pain Loc --      Pain Edu? --      Excl. in [place]? --     Vital signs reviewed, nursing assessments reviewed.   Constitutional:   Alert and oriented. Non-toxic appearance. Eyes:   Conjunctivae are normal. EOMI. PERRL. ENT      Head:   Normocephalic and atraumatic.      Nose:   Wearing a mask.      Mouth/Throat:   Wearing a mask.      Neck:   No meningismus. Full ROM. Hematological/Lymphatic/Immunilogical:   No cervical lymphadenopathy. Cardiovascular:   RRR. Symmetric bilateral radial and DP pulses.  No murmurs. Cap refill less than 2 seconds. Respiratory:   Normal respiratory effort without tachypnea/retractions.  Symmetric breath sounds.  Bilateral lower lung crackles Gastrointestinal:   Soft and nontender. Non distended. There is no CVA tenderness.  No rebound, rigidity, or guarding. Genitourinary:   deferred Musculoskeletal:   Normal range of motion in all extremities. No joint effusions.  No lower extremity tenderness.  No edema. Neurologic:   Normal speech and language.  Motor grossly intact. No acute focal neurologic deficits are appreciated.  Skin:    Skin is warm, dry and intact. No rash noted.  No petechiae, purpura, or bullae.  ____________________________________________    LABS (pertinent positives/negatives) (all labs ordered are listed, but only abnormal results are displayed) Labs Reviewed  CBC - Abnormal; Notable for the following components:      Result Value   RBC 3.50 (*)    Hemoglobin 9.1 (*)    HCT 30.0 (*)    All other components within normal limits  BASIC METABOLIC PANEL - Abnormal; Notable for the  following components:   Glucose, Bld 135 (*)    Calcium 8.5 (*)    All other components within normal limits   ____________________________________________   EKG  Interpreted by me Normal sinus rhythm rate of 86, normal axis and intervals.  Poor R wave progression.  Normal ST segments and T waves.  No ischemic changes  ____________________________________________    RADIOLOGY  DG Chest 2 View  Result Date: 11/17/2021 CLINICAL DATA:  Shortness of breath. EXAM: CHEST - 2 VIEW COMPARISON:  Radiographs 10/02/2021 and 08/08/2021.  CT 10/03/2021. FINDINGS: The heart size and mediastinal contours are stable with aortic atherosclerosis. There is stable chronic elevation/eventration of the right hemidiaphragm. There is chronic interstitial lung disease with diffuse fibrosis. No evidence of superimposed airspace disease, edema, pleural effusion or pneumothorax. There are degenerative changes throughout the spine status post cervical and lumbar fusion. No acute osseous findings are evident. Evidence of bilateral chronic rotator cuff tears. A loop recorder overlies the left anterior chest. IMPRESSION: Stable chronic fibrotic lung disease. No evidence of acute superimposed process. Electronically Signed   By: Richardean Sale M.D.   On: 11/17/2021 12:34    ____________________________________________   PROCEDURES Procedures  ____________________________________________    CLINICAL IMPRESSION / ASSESSMENT AND PLAN / ED COURSE  Medications ordered in the ED: Medications - No data to display  Pertinent labs & imaging results that were available during my care of the patient were reviewed by me and considered in my medical decision making (see chart for details).  Beverley Fiedler  [place] was evaluated in Emergency Department on 11/17/2021 for the symptoms described in the history of present illness. She was evaluated in the context of the global COVID-19 pandemic, which necessitated consideration that  the patient might be at risk for infection with the SARS-CoV-2 virus that causes COVID-19. Institutional protocols and algorithms that pertain to the evaluation of patients at risk for COVID-19 are in a state of rapid change based on information released by regulatory bodies including the CDC and federal and state organizations. These policies and algorithms were followed during the patient's care in the ED.   Patient sent to the ED due to hypoxia on room air.  She has already been found in the past to require full-time oxygen.  Feeling of her bedside notes that she has around-the-clock home health aide care at home, and they are comfortable with her being at home and do not want to be admitted today.  Patient denies any acute complaints, and seems to be back to baseline on her usual oxygen level.  In the past, she had been sent portable oxygen DME but the patient has returned it and refused it because she does not like the way it sounds.  The current problem is that she will require ambulance transport home to avoid severe hypoxia during transit.  Otherwise she is stable.  I discussed with her pulmonologist, Dr. Patsey Berthold who came and saw her in the ED as well.  They will plan to order her a nebulizer and bronchodilators, and they suggest obtaining a mini oxygen tank that the patient can take with her in the car.  Patient is a client of [facility] already.  Will consult TOC team.      ____________________________________________   FINAL CLINICAL IMPRESSION(S) / ED DIAGNOSES    Final diagnoses:  Chronic respiratory failure with hypoxia ([place])  Chronic obstructive pulmonary disease, unspecified COPD type [facility])     ED Discharge Orders     None       Portions of this note were generated with dragon dictation software. Dictation errors may occur despite best attempts at proofreading.    Carrie Mew, MD 11/17/21 1535

## 2021-11-17 NOTE — Progress Notes (Addendum)
RT ambulated pt. On ra and sats went down to 71%t.Placed on 2lnc and sats went up to 92%.. Pt. Was then ambulated wit 2lnc and sats went down to 90%. Pt. Remains on 2lnc at rest sat at 99%.  Titration performed by Baker Janus. [place] RRT  Present during ambulation with the patient.  Patient is very debilitated however during ambulation on 2 L/min saturations between 90 to 89%.  Brief desaturation to 87%.  Recommend 3 L/min with ambulation and 2 L/min with rest and during sleep.  Dx: Pulmonary Fibrosis, chronic respiratory failure with hypoxia.  Renold Don, MD Advanced Bronchoscopy PCCM [facility]

## 2021-11-17 NOTE — Progress Notes (Signed)
Subjective:    Patient ID: Melody Ochoa, female    DOB: 1941/04/01, 80 y.o.   MRN: [id number] Chief Complaint  Patient presents with   Lung Lesion    HPI Patient is an 80 year old remote former smoker presented for evaluation of a lung nodule.  Patient however presented in severe respiratory distress, she was not wearing oxygen saturations were 43% on room air.   Review of Systems Deferred due to acuity of situation.  Patient in severe respiratory distress.    Objective:   Physical Exam Oxygen saturations 43% Patient in severe respiratory distress Recovered with 100% nonrebreather Transported to ED for evaluation       Assessment & Plan:   ED course: Patient was seen by the emergency room physician Dr. Joni Fears I was asked by Dr. Joni Fears whether patient could be released to home.  I evaluated the patient briefly in the ED and appears that she has recovered after stabilization in the ED.  X-ray obtained shows no acute pulmonary disease however the patient does have extensive fibrotic changes.  Patient had previously declined oxygen AGAINST MEDICAL ADVICE.  I had a long conversation with the patient she agrees to wear her oxygen at all times.  She was titrated to 2 L/min with rest.  She is to continue 2 L/min with sleep as she is doing and she will need 3 L/min with ambulation.  Order sent to [facility].  Patient also complains of occasional bronchospasm.  This has been an ongoing issue for her we ordered nebulization equipment and solutions.  We will see her in follow-up in 4 to 6 weeks time.  Our office will call her with follow-up instructions.     ICD-10-CM   1. Pulmonary fibrosis ([place])  J84.10 AMB REFERRAL FOR DME    AMB REFERRAL FOR DME   This is end-stage I have initiated end-of-life discussions with the patient Recommend referral to palliative care Patient is thinking about it    2. Acute on chronic respiratory failure with hypoxia (HCC)  J96.21    Patient recovered  in ED with supplemental oxygen Will need 2 L/min with rest and sleep 3 L/min with ambulation Titration was performed by RRT    3. Lung nodule  R91.1    She will have to be placed on observational protocol Nodule not amenable to biopsy without significant risk Patient not a candidate for invasive procedures    4. Bronchospasm  J98.01    Nebulizer DuoNeb 4 times a day as needed     Orders Placed This Encounter  Procedures   AMB REFERRAL FOR DME    Referral Priority:   Routine    Referral Type:   Durable Medical Equipment Purchase    Number of Visits Requested:   1   AMB REFERRAL FOR DME    Referral Priority:   Routine    Referral Type:   Durable Medical Equipment Purchase    Number of Visits Requested:   1   Meds ordered this encounter  Medications   ipratropium-albuterol (DUONEB) 0.5-2.5 (3) MG/3ML SOLN    Sig: Take 3 mLs by nebulization every 6 (six) hours as needed.    Dispense:  360 mL    Refill:  11    C. Derrill Kay, MD Advanced Bronchoscopy PCCM [facility]    *This note was dictated using voice recognition software/Dragon.  Despite best efforts to proofread, errors can occur which can change the meaning. Any transcriptional errors that result from this process  are unintentional and may not be fully corrected at the time of dictation.

## 2021-11-18 ENCOUNTER — Telehealth: Payer: Self-pay

## 2021-11-18 ENCOUNTER — Telehealth: Payer: Self-pay | Admitting: Family

## 2021-11-18 DIAGNOSIS — J9611 Chronic respiratory failure with hypoxia: Secondary | ICD-10-CM

## 2021-11-18 NOTE — Telephone Encounter (Signed)
Unable to leave message for POA to return call back. VM box is full.

## 2021-11-18 NOTE — Telephone Encounter (Signed)
Patient's POA, Rod Holler called. Patient was in the ED, she needs to speak to Arnett's CMA. There are many things that need to be done, patient needs oxygen ordered, and a referral for palliative care. Please call Rod Holler at [phone].

## 2021-11-18 NOTE — Telephone Encounter (Signed)
Melody Ochoa,   Call POA  Dr Patsey Berthold placed dme order to [place] for pt's o2.  She can follow up with her office regarding this referral and [phone]   Please place palliative care referral for pulmonary fibrosis, chronic hypoxia

## 2021-11-18 NOTE — Telephone Encounter (Signed)
Order for ML6 tanks placed to [place]. Nothing further needed.

## 2021-11-19 NOTE — Telephone Encounter (Signed)
Melody Ochoa [facility]) returning call. Melody Ochoa stated she will call back later. Melody Ochoa stated that she will be in with a client this afternoon and she wouldn't be able to answer the phone.

## 2021-11-19 NOTE — Telephone Encounter (Signed)
POA was advised Dr. Patsey Berthold placed dme order for pts o2. Pt gave verbal understanding. Pt also states the Home Health agency doesn't have palliative care. She is with [facility].      POA has a few questions and concerns regarding pt.  Pt is taking stool softeners and it has become "a bit too much," POA wants to know if we can change this to every other day its taken instead of one a day.  POA also wants to know if there is an official DNR sign that they can post on their refrigerator. They were advised to post one from home health agency

## 2021-11-20 NOTE — Telephone Encounter (Signed)
Call pt, caregiver  Yes may decrease colace to every other day or every third day to keep bowels regular, soft  As for DNR order , she needs to make an appt so we can complete

## 2021-11-20 NOTE — Telephone Encounter (Signed)
Per [name] with [employer], oxygen tanks will be delivered today.  [name](POA) is aware and voiced her understanding.

## 2021-11-20 NOTE — Telephone Encounter (Signed)
Spoken to Rod Holler, they will wait on DNR for now and fill out at the next appointment. She stated that Dr Patsey Berthold office instructed them that Palliative care order needed to be placed by PCP office.

## 2021-11-23 ENCOUNTER — Other Ambulatory Visit: Payer: Self-pay | Admitting: Family

## 2021-11-23 DIAGNOSIS — J8410 Pulmonary fibrosis, unspecified: Secondary | ICD-10-CM

## 2021-11-23 NOTE — Telephone Encounter (Signed)
Rasheedah,   I placed referral for patient to see palliative care.  I just want to be clear here as this is not a hospice referral however palliative care is under hospice which is my understanding.  Just want to make sure that referral comes over appropriately to you.  Carlis Stable

## 2021-11-24 ENCOUNTER — Telehealth: Payer: Self-pay | Admitting: Family

## 2021-11-24 NOTE — Telephone Encounter (Signed)
Patient called in to request a small oxygen tank to go with her when she goes out

## 2021-11-24 NOTE — Telephone Encounter (Signed)
I called and spoke with patient & she stated that [employer] rep came out a few days ago to set her up with small oxygen tank. At the time she refused bc she thought that she wouldn't like it. Now that she realizes that she would have to take a much larger tank with her instead she would like them to send the smaller one back. I called [employer] for patient & they will be reaching out to patient to get her set back up to bring small portable tank. When I called patient back to let her know I couldn't reach her. I LM with info & asked her to call back if needed.

## 2021-11-25 ENCOUNTER — Telehealth: Payer: Self-pay

## 2021-11-25 NOTE — Telephone Encounter (Signed)
Palliative consult scheduled for 12/24/21 at 12:30 pm with POA Rod Holler.

## 2021-11-26 ENCOUNTER — Telehealth: Payer: Self-pay | Admitting: Family

## 2021-11-26 NOTE — Telephone Encounter (Signed)
Patient dropped off a handicapp renewal form to be completed by Arnett.

## 2021-12-01 ENCOUNTER — Other Ambulatory Visit: Payer: Self-pay

## 2021-12-01 ENCOUNTER — Encounter: Payer: Self-pay | Admitting: Internal Medicine

## 2021-12-01 ENCOUNTER — Ambulatory Visit (INDEPENDENT_AMBULATORY_CARE_PROVIDER_SITE_OTHER): Payer: Medicare Other | Admitting: Internal Medicine

## 2021-12-01 VITALS — BP 116/64 | HR 81 | Ht 64.0 in | Wt 173.6 lb

## 2021-12-01 DIAGNOSIS — I639 Cerebral infarction, unspecified: Secondary | ICD-10-CM | POA: Diagnosis not present

## 2021-12-01 DIAGNOSIS — Z959 Presence of cardiac and vascular implant and graft, unspecified: Secondary | ICD-10-CM | POA: Diagnosis not present

## 2021-12-01 NOTE — Patient Instructions (Signed)
Medication Instructions:   Your physician recommends that you continue on your current medications as directed. Please refer to the Current Medication list given to you today.  *If you need a refill on your cardiac medications before your next appointment, please call your pharmacy*   Lab Work:  None ordered   Testing/Procedures:  None ordered   Follow-Up: At [facility], you and your health needs are our priority.  As part of our continuing mission to provide you with exceptional heart care, we have created designated Provider Care Teams.  These Care Teams include your primary Cardiologist (physician) and Advanced Practice Providers (APPs -  Physician Assistants and Nurse Practitioners) who all work together to provide you with the care you need, when you need it.  We recommend signing up for the patient portal called "MyChart".  Sign up information is provided on this After Visit Summary.  MyChart is used to connect with patients for Virtual Visits (Telemedicine).  Patients are able to view lab/test results, encounter notes, upcoming appointments, etc.  Non-urgent messages can be sent to your provider as well.   To learn more about what you can do with MyChart, go to [email or web address].    Your next appointment:   1 year(s)  The format for your next appointment:   In Person  Provider:   You may see Dr. Virl Axe

## 2021-12-01 NOTE — Progress Notes (Signed)
ELECTROPHYSIOLOGY OFFICE NOTE  Patient ID: Melody Ochoa, MRN: [id number], DOB/AGE: July 25, 1941 80 y.o. Admit date: (Not on file) Date of Consult: 12/01/2021  Primary Physician: Burnard Hawthorne, FNP Primary Cardiologist: new      HPI Melody Ochoa is a 80 y.o. female  Seen following ILR LINQ implantation 9/18 for Cryptogenic Stroke by [facility] for which has been treated with ASA and plavix;( was to see [facility] neuro 7/22) also with problems with orthostatic intolerance.    The patient denies chest pain or peripheral edema.  There have been no palpitations, lightheadedness or syncope.  Complains of severe dyspnea now on 24/7 O2 followed closely by Dr Gala Lewandowsky.   DOE diagnosed with "COPD:"  hrCT Spectrum of findings compatible with fibrotic interstitial lung disease with mild honeycombing and without a clear apicobasilar gradient. No convincing interval progression since 05/21/2019 chest CT, although with mild progression since baseline 2018 chest CT.    Ended up with hip replacement surgery 8/22 from which she has not well recovered, still largely non ambulatory--PT at home   7/20 Echocardiogram demonstrated EF 50-55%, mild LVH, grade 1 diastolic dysfunction, LA 44.  TEE  Bulky aortic calcified plaque/ MR   Interval hospitalization for fall cx by fractured hip; post op anemia   Date LDL  9/18 126  10 /20 67   Date Cr K Hgb  6/21 1.19 3.9 11.1   9/22 0.84 3.9 8.4  12/22 0.89 3.7 9.1 (ferritin 156  FeSat 14/ 10/22)       DATE TEST EF   7/20 Echo  50-55%   1/22 Echo  55% LAE mod discordant- ( 5.2 cm/26 m;/m2)              Past Medical History:  Diagnosis Date   Arthritis    Atheromatous plaque 10/18/2017   Back pain    Cancer (HCC)    skin   Depression    Diverticulosis    Dizziness    patient had episode of dizziness when came in room. hx past no dx   GERD (gastroesophageal reflux disease)    Heart murmur    Hyperlipidemia 10/18/2017   PONV (postoperative  nausea and vomiting)    yrs ago   Squamous cell carcinoma of skin 11/16/2016   L chest parasternal   Squamous cell carcinoma of skin 02/05/2020   L cheek inf to lat zygoma   Stroke [facility])       Surgical History:  Past Surgical History:  Procedure Laterality Date   BACK SURGERY  13   BREAST BIOPSY Bilateral    cores "years ago"   BREAST EXCISIONAL BIOPSY Left 90's   CATARACT EXTRACTION W/PHACO Left 12/20/2018   Procedure: CATARACT EXTRACTION PHACO AND INTRAOCULAR LENS PLACEMENT ([place]) LEFT TOPICAL;  Surgeon: Leandrew Koyanagi, MD;  Location: [place];  Service: Ophthalmology;  Laterality: Left;   CATARACT EXTRACTION W/PHACO Right 01/24/2019   Procedure: CATARACT EXTRACTION PHACO AND INTRAOCULAR LENS PLACEMENT ([place]) RIGHT;  Surgeon: Leandrew Koyanagi, MD;  Location: [place];  Service: Ophthalmology;  Laterality: Right;   CERVICAL DISC SURGERY  09   COLONOSCOPY WITH PROPOFOL N/A 01/13/2016   Procedure: COLONOSCOPY WITH PROPOFOL;  Surgeon: [name], MD;  Location: [facility] ENDOSCOPY;  Service: Endoscopy;  Laterality: N/A;   COLONOSCOPY WITH PROPOFOL N/A 02/19/2020   Procedure: COLONOSCOPY WITH PROPOFOL;  Surgeon: [name], MD;  Location: [facility] ENDOSCOPY;  Service: Gastroenterology;  Laterality: N/A;   FACELIFT  94   LOOP  RECORDER INSERTION N/A 08/23/2017   Procedure: LOOP RECORDER INSERTION;  Surgeon: Thompson Grayer, MD;  Location: [place] CV LAB;  Service: Cardiovascular;  Laterality: N/A;   TEE WITHOUT CARDIOVERSION N/A 08/12/2017   Procedure: TRANSESOPHAGEAL ECHOCARDIOGRAM (TEE);  Surgeon: Sueanne Margarita, MD;  Location: [facility] ENDOSCOPY;  Service: Cardiovascular;  Laterality: N/A;   TONSILLECTOMY     TOTAL HIP ARTHROPLASTY Right 04/22/2016   Procedure: TOTAL HIP ARTHROPLASTY;  Surgeon: Corky Mull, MD;  Location: [facility] ORS;  Service: Orthopedics;  Laterality: Right;   TOTAL HIP ARTHROPLASTY Left 08/05/2021   Procedure: TOTAL HIP ARTHROPLASTY;  Surgeon: Corky Mull, MD;  Location: [facility] ORS;  Service: Orthopedics;  Laterality: Left;     Home Meds: Current Outpatient Medications  Medication Instructions   acetaminophen (TYLENOL) 500-1,000 mg, Oral, Every 6 hours PRN   albuterol (VENTOLIN HFA) 108 (90 Base) MCG/ACT inhaler TAKE 2 PUFFS BY MOUTH EVERY 6 HOURS AS NEEDED FOR WHEEZE OR SHORTNESS OF BREATH   atorvastatin (LIPITOR) 80 mg, Oral, Daily   buPROPion (WELLBUTRIN XL) 300 MG 24 hr tablet TAKE ONE TABLET BY MOUTH EVERY DAY   clopidogrel (PLAVIX) 75 mg, Oral, Daily   docusate sodium (COLACE) 100 mg, Oral, 2 times daily   DULoxetine (CYMBALTA) 30 MG capsule TAKE 1 CAPSULE BY MOUTH EVERY DAY   famotidine (PEPCID) 20 MG tablet TAKE ONE TABLET BY MOUTH AT BEDTIME   feeding supplement (ENSURE ENLIVE / ENSURE PLUS) LIQD 237 mLs, Oral, 3 times daily between meals   ferrous sulfate 325 mg, Oral, Daily   fluticasone (FLONASE) 50 MCG/ACT nasal spray SPRAY 2 SPRAYS INTO EACH NOSTRIL EVERY DAY   ipratropium-albuterol (DUONEB) 0.5-2.5 (3) MG/3ML SOLN 3 mLs, Nebulization, Every 6 hours PRN   pregabalin (LYRICA) 50 MG capsule TAKE 1 CAPSULE (50 MG) BY MOUTH EVERY DAY AND TAKE 2 CAPSULES (100 MG) AT BEDTIME. Max daily dose 125m/day.      Allergies:  Allergies  Allergen Reactions   Latex Itching   Aleve [Naproxen Sodium] Other (See Comments)    Gi upset   Aspirin Other (See Comments)    Stomach pain-aggravates diverticulosis   Celebrex [Celecoxib] Other (See Comments)    Dizziness    Effexor [Venlafaxine] Other (See Comments)    Hot flashes    Gabapentin Other (See Comments)    "Night terrors"   Hydrocodone Bit-Homatrop Mbr Diarrhea   Ibuprofen Other (See Comments)    Gi upset    Mobic [Meloxicam]     Stomach upset   Tape Itching and Other (See Comments)    Itchy blisters   Vioxx [Rofecoxib] Other (See Comments)    Gi upset   Other Rash and Other (See Comments)    bandaides   Tramadol Other (See Comments)    hallucinations     ROS:   Please see the history of present illness.     All other systems reviewed and negative.   Physical Examination: BP 116/64    Pulse 81    Ht _0  (1.626 m)    Wt 173 lb 9.6 oz (78.7 kg)    SpO2 (!) 88% Comment: on oxygen   BMI 29.80 kg/m  Well developed and nourished in no acute distress sitting in a wheel chair on oxygen HENT normal Neck supple   Diffuse crackles  Regular rate and rhythm, no murmurs or gallops Abd-soft with active BS No Clubbing cyanosis tr edema Skin-warm and dry A & Oriented  Grossly normal sensory and motor function  ECG Sinus @ 81 18/10/42      Assessment and Plan:  Cryptogenic Stroke   Aortic plaque  LINQ in place--EOS  Hyperlipidemia  Orthostatic LH    DOE /O2 dependent -worsening   Anemia   Still largely chair bound  Remains on clopidogrel  Anemia and orthostatic hypotension raise concern for amyloid--  Will consider Urine and serum immunofixation, free light chains CRP/ESR quantitative immunoglobinsbut have reached out to PCP who will refer to heme  With interval worsening of pulmonary status as outlined above had a lengthy discussion regarding end-of-life care.  She says "want to live "has been told that if she were to end up on a ventilator she would not come off.  I think CPR would make that likelihood for greater still.  Hence, she has intimated to [place] today that she would like shock but no chest compressions and no intubation.  She will discuss that with her DPR and then get back to [place]. Virl Axe

## 2021-12-04 ENCOUNTER — Telehealth: Payer: Self-pay | Admitting: Family

## 2021-12-04 DIAGNOSIS — D649 Anemia, unspecified: Secondary | ICD-10-CM

## 2021-12-04 NOTE — Telephone Encounter (Signed)
FYI LM for patient to call back.   I did speak with [facility] & iron is in patient's am bubble pack. It is only taken once daily & she stated is a little red pill so I know what to tell patient it looks like for reference.

## 2021-12-04 NOTE — Telephone Encounter (Signed)
LM with [facility] to call back to see if iron has been in patient's bubble packs.

## 2021-12-04 NOTE — Telephone Encounter (Signed)
I have spoken with Rod Holler [facility] POA & she will pick up placard when able. I will place upfront for pick up.

## 2021-12-04 NOTE — Telephone Encounter (Signed)
Melody Ochoa Marked heme referral as urgent- how soon can she be seen?  Call pt I consulted with Dr Caryl Comes We reviewed her labs and anemia is worsening again. Is she taking iron and how many times per day?  I would like for her to see hematology for further evaluation and have placed referral. Let us know if you dont hear back within a week in regards to an appointment being scheduled.

## 2021-12-04 NOTE — Telephone Encounter (Signed)
I completed   

## 2021-12-08 ENCOUNTER — Ambulatory Visit (INDEPENDENT_AMBULATORY_CARE_PROVIDER_SITE_OTHER): Payer: Medicare Other

## 2021-12-08 ENCOUNTER — Telehealth: Payer: Self-pay | Admitting: Family

## 2021-12-08 ENCOUNTER — Other Ambulatory Visit: Payer: Self-pay

## 2021-12-08 ENCOUNTER — Other Ambulatory Visit: Payer: Self-pay | Admitting: Family

## 2021-12-08 DIAGNOSIS — Z7982 Long term (current) use of aspirin: Secondary | ICD-10-CM | POA: Diagnosis not present

## 2021-12-08 DIAGNOSIS — S72092D Other fracture of head and neck of left femur, subsequent encounter for closed fracture with routine healing: Secondary | ICD-10-CM | POA: Diagnosis not present

## 2021-12-08 DIAGNOSIS — Z111 Encounter for screening for respiratory tuberculosis: Secondary | ICD-10-CM

## 2021-12-08 DIAGNOSIS — Z9981 Dependence on supplemental oxygen: Secondary | ICD-10-CM | POA: Diagnosis not present

## 2021-12-08 DIAGNOSIS — J8410 Pulmonary fibrosis, unspecified: Secondary | ICD-10-CM | POA: Diagnosis not present

## 2021-12-08 DIAGNOSIS — G9009 Other idiopathic peripheral autonomic neuropathy: Secondary | ICD-10-CM | POA: Diagnosis not present

## 2021-12-08 DIAGNOSIS — J9610 Chronic respiratory failure, unspecified whether with hypoxia or hypercapnia: Secondary | ICD-10-CM | POA: Diagnosis not present

## 2021-12-08 DIAGNOSIS — Z7902 Long term (current) use of antithrombotics/antiplatelets: Secondary | ICD-10-CM | POA: Diagnosis not present

## 2021-12-08 DIAGNOSIS — H5789 Other specified disorders of eye and adnexa: Secondary | ICD-10-CM | POA: Diagnosis not present

## 2021-12-08 DIAGNOSIS — Z8673 Personal history of transient ischemic attack (TIA), and cerebral infarction without residual deficits: Secondary | ICD-10-CM | POA: Diagnosis not present

## 2021-12-08 DIAGNOSIS — E785 Hyperlipidemia, unspecified: Secondary | ICD-10-CM | POA: Diagnosis not present

## 2021-12-08 DIAGNOSIS — Z950 Presence of cardiac pacemaker: Secondary | ICD-10-CM | POA: Diagnosis not present

## 2021-12-08 DIAGNOSIS — Z9181 History of falling: Secondary | ICD-10-CM | POA: Diagnosis not present

## 2021-12-08 DIAGNOSIS — D631 Anemia in chronic kidney disease: Secondary | ICD-10-CM | POA: Diagnosis not present

## 2021-12-08 DIAGNOSIS — Z96642 Presence of left artificial hip joint: Secondary | ICD-10-CM | POA: Diagnosis not present

## 2021-12-08 DIAGNOSIS — K219 Gastro-esophageal reflux disease without esophagitis: Secondary | ICD-10-CM | POA: Diagnosis not present

## 2021-12-08 DIAGNOSIS — I951 Orthostatic hypotension: Secondary | ICD-10-CM | POA: Diagnosis not present

## 2021-12-08 DIAGNOSIS — N1831 Chronic kidney disease, stage 3a: Secondary | ICD-10-CM | POA: Diagnosis not present

## 2021-12-08 NOTE — Telephone Encounter (Signed)
POA Rod Holler called in stated the patient is in process moving with husband to a [place] and need SL2 FORM and TB test. Please call Rod Holler  [phone]

## 2021-12-08 NOTE — Telephone Encounter (Signed)
I see pt is scheduled to see Dr Janese Banks ( heme/onc) tomorrow, please ensure she is aware

## 2021-12-08 NOTE — Telephone Encounter (Signed)
See previous phone message as I have already spoken to Rod Holler in regards this.

## 2021-12-08 NOTE — Telephone Encounter (Signed)
I have spoken with Melody Ochoa from [facility]. I have faxed FL2 to [phone]. I asked that if any further questions or issues she call me back. I will fax result of TB skin test on Thursday.

## 2021-12-08 NOTE — Progress Notes (Signed)
Patient comes in for PPD placement. Placed in right arm. Will return to have read. Tolerated well; no complaints at this time.

## 2021-12-08 NOTE — Telephone Encounter (Signed)
I spoke with health care [place] she is aware of appointment with Dr. Janese Banks.  Pt is planning now to be placed at [facility] in memory care with husband this Thursday. I have starting filling out FL2 foirm to fax to Bevelyn Ngo her [phone]. I will place in folder to sign. Pt needed TB skin test before she can be placed there & I scheduled to for today at 11:15. Is this okay so that they can get patient in as soon as possible? This is a memory care unit & she will reside with her husband as well. It will at first be on a 30 day trial to see if good for for patient.

## 2021-12-08 NOTE — Telephone Encounter (Signed)
Noted  TB test okay Completed FL2

## 2021-12-09 ENCOUNTER — Encounter: Payer: Self-pay | Admitting: Oncology

## 2021-12-09 ENCOUNTER — Inpatient Hospital Stay: Payer: Medicare Other | Attending: Oncology | Admitting: Oncology

## 2021-12-09 ENCOUNTER — Inpatient Hospital Stay: Payer: Medicare Other

## 2021-12-09 VITALS — BP 106/62 | HR 79 | Temp 97.8°F | Resp 18 | Wt 173.0 lb

## 2021-12-09 DIAGNOSIS — I10 Essential (primary) hypertension: Secondary | ICD-10-CM | POA: Diagnosis not present

## 2021-12-09 DIAGNOSIS — N1831 Chronic kidney disease, stage 3a: Secondary | ICD-10-CM | POA: Insufficient documentation

## 2021-12-09 DIAGNOSIS — Z79899 Other long term (current) drug therapy: Secondary | ICD-10-CM | POA: Diagnosis not present

## 2021-12-09 DIAGNOSIS — Z8673 Personal history of transient ischemic attack (TIA), and cerebral infarction without residual deficits: Secondary | ICD-10-CM | POA: Insufficient documentation

## 2021-12-09 DIAGNOSIS — Z803 Family history of malignant neoplasm of breast: Secondary | ICD-10-CM | POA: Insufficient documentation

## 2021-12-09 DIAGNOSIS — D649 Anemia, unspecified: Secondary | ICD-10-CM

## 2021-12-09 DIAGNOSIS — Z87891 Personal history of nicotine dependence: Secondary | ICD-10-CM | POA: Diagnosis not present

## 2021-12-09 DIAGNOSIS — Z801 Family history of malignant neoplasm of trachea, bronchus and lung: Secondary | ICD-10-CM | POA: Insufficient documentation

## 2021-12-09 DIAGNOSIS — I129 Hypertensive chronic kidney disease with stage 1 through stage 4 chronic kidney disease, or unspecified chronic kidney disease: Secondary | ICD-10-CM | POA: Insufficient documentation

## 2021-12-09 LAB — CBC WITH DIFFERENTIAL/PLATELET
Abs Immature Granulocytes: 0.06 10*3/uL (ref 0.00–0.07)
Basophils Absolute: 0.1 10*3/uL (ref 0.0–0.1)
Basophils Relative: 1 %
Eosinophils Absolute: 1.1 10*3/uL — ABNORMAL HIGH (ref 0.0–0.5)
Eosinophils Relative: 12 %
HCT: 31.8 % — ABNORMAL LOW (ref 36.0–46.0)
Hemoglobin: 9.9 g/dL — ABNORMAL LOW (ref 12.0–15.0)
Immature Granulocytes: 1 %
Lymphocytes Relative: 16 %
Lymphs Abs: 1.5 10*3/uL (ref 0.7–4.0)
MCH: 26.4 pg (ref 26.0–34.0)
MCHC: 31.1 g/dL (ref 30.0–36.0)
MCV: 84.8 fL (ref 80.0–100.0)
Monocytes Absolute: 0.7 10*3/uL (ref 0.1–1.0)
Monocytes Relative: 7 %
Neutro Abs: 6.1 10*3/uL (ref 1.7–7.7)
Neutrophils Relative %: 63 %
Platelets: 258 10*3/uL (ref 150–400)
RBC: 3.75 MIL/uL — ABNORMAL LOW (ref 3.87–5.11)
RDW: 16 % — ABNORMAL HIGH (ref 11.5–15.5)
WBC: 9.4 10*3/uL (ref 4.0–10.5)
nRBC: 0 % (ref 0.0–0.2)

## 2021-12-09 LAB — COMPREHENSIVE METABOLIC PANEL
ALT: 13 U/L (ref 0–44)
AST: 20 U/L (ref 15–41)
Albumin: 3.5 g/dL (ref 3.5–5.0)
Alkaline Phosphatase: 91 U/L (ref 38–126)
Anion gap: 8 (ref 5–15)
BUN: 19 mg/dL (ref 8–23)
CO2: 23 mmol/L (ref 22–32)
Calcium: 8.5 mg/dL — ABNORMAL LOW (ref 8.9–10.3)
Chloride: 102 mmol/L (ref 98–111)
Creatinine, Ser: 1.12 mg/dL — ABNORMAL HIGH (ref 0.44–1.00)
GFR, Estimated: 49 mL/min — ABNORMAL LOW (ref >60)
Glucose, Bld: 132 mg/dL — ABNORMAL HIGH (ref 70–99)
Potassium: 3.6 mmol/L (ref 3.5–5.1)
Sodium: 133 mmol/L — ABNORMAL LOW (ref 135–145)
Total Bilirubin: 0.4 mg/dL (ref 0.3–1.2)
Total Protein: 7.6 g/dL (ref 6.5–8.1)

## 2021-12-09 LAB — RETICULOCYTES
Immature Retic Fract: 9 % (ref 2.3–15.9)
RBC.: 3.76 MIL/uL — ABNORMAL LOW (ref 3.87–5.11)
Retic Count, Absolute: 62.8 10*3/uL (ref 19.0–186.0)
Retic Ct Pct: 1.7 % (ref 0.4–3.1)

## 2021-12-09 LAB — FOLATE: Folate: 9.7 ng/mL (ref >5.9)

## 2021-12-09 LAB — TSH: TSH: 0.795 u[IU]/mL (ref 0.350–4.500)

## 2021-12-09 LAB — FERRITIN: Ferritin: 104 ng/mL (ref 11–307)

## 2021-12-09 LAB — VITAMIN B12: Vitamin B-12: 239 pg/mL (ref 180–914)

## 2021-12-09 NOTE — Progress Notes (Signed)
Hematology/Oncology Consult note [facility] Telephone:([phone] [phone]  Patient Care Team: Burnard Hawthorne, FNP as PCP - General (Family Medicine) Deboraha Sprang, MD as PCP - Electrophysiology (Cardiology)   Name of the patient: Melody Ochoa  [id number]  1941/08/07    Reason for referral-anemia   Referring physician-Margaret Vidal Schwalbe, FNP  Date of visit: 12/09/21   History of presenting illness- Patient is a 81 year old female with a past medical history significant for interstitial lung disease on home oxygen, hypertension hyperlipidemia, history of CVA who has been referred to [place] for anemia.  Looking back at her prior CBCs patient has had chronic normocytic anemia with a hemoglobin that has been around 10-11 for the last 3 years.  White cell count and platelets have been normal.  Patient underwent hip surgery sometime in August 2022 and following that her hemoglobin dropped down to 7.9 and then subsequently came back up to 9.  Most recently her CBC showed white count of 9.7, H&H of 9.1/30 with a platelet count of 295.  Iron studies back in October 2022 were indicative of anemia of chronic disease.  Patient lives alone and has a 24-hour caregiver.  Denies any blood loss in her stool or urine.  Denies any dark melanotic stool.  Appetite and weight have remained stable  ECOG PS- 3  Pain scale- 0   Review of systems- Review of Systems  Constitutional:  Positive for malaise/fatigue. Negative for chills, fever and weight loss.  HENT:  Negative for congestion, ear discharge and nosebleeds.   Eyes:  Negative for blurred vision.  Respiratory:  Negative for cough, hemoptysis, sputum production, shortness of breath and wheezing.   Cardiovascular:  Negative for chest pain, palpitations, orthopnea and claudication.  Gastrointestinal:  Negative for abdominal pain, blood in stool, constipation, diarrhea, heartburn, melena, nausea and vomiting.   Genitourinary:  Negative for dysuria, flank pain, frequency, hematuria and urgency.  Musculoskeletal:  Negative for back pain, joint pain and myalgias.  Skin:  Negative for rash.  Neurological:  Negative for dizziness, tingling, focal weakness, seizures, weakness and headaches.  Endo/Heme/Allergies:  Does not bruise/bleed easily.  Psychiatric/Behavioral:  Negative for depression and suicidal ideas. The patient does not have insomnia.    Allergies  Allergen Reactions   Latex Itching   Aleve [Naproxen Sodium] Other (See Comments)    Gi upset   Aspirin Other (See Comments)    Stomach pain-aggravates diverticulosis   Celebrex [Celecoxib] Other (See Comments)    Dizziness    Effexor [Venlafaxine] Other (See Comments)    Hot flashes    Gabapentin Other (See Comments)    "Night terrors"   Hydrocodone Bit-Homatrop Mbr Diarrhea   Ibuprofen Other (See Comments)    Gi upset    Mobic [Meloxicam]     Stomach upset   Tape Itching and Other (See Comments)    Itchy blisters   Vioxx [Rofecoxib] Other (See Comments)    Gi upset   Other Rash and Other (See Comments)    bandaides   Tramadol Other (See Comments)    hallucinations    Patient Active Problem List   Diagnosis Date Noted   Nasal congestion 11/12/2021   Diarrhea 11/12/2021   Upper respiratory tract infection 11/12/2021   Pain in joint of right elbow 11/09/2021   Weakness    Confusion    Hypotension    Acute blood loss anemia    Irritation of left eye    Acute respiratory failure with hypoxia ([place])  Interstitial lung disease (HCC)    Gastroesophageal reflux disease without esophagitis    Leg swelling 07/13/2021   Intercostal neuralgia 04/16/2021   Multiple closed fractures of ribs of left side 04/16/2021   Adenoma of left adrenal gland 12/25/2020   Ischemic stroke ([place]) 12/10/2020   Chronic kidney disease, stage 3a ([place]) 12/09/2020   Chronic respiratory failure with hypoxia ([place]) 12/09/2020   Fall at home, initial  encounter 12/09/2020   Right shoulder pain 12/09/2020   Memory deficit 09/24/2020   Pulmonary fibrosis ([place]) 09/14/2020   Chronic cough 09/14/2020   Arthritis of left sacroiliac joint 08/26/2020   Chronic left SI joint pain 08/26/2020   CKD (chronic kidney disease) 07/23/2020   Impingement syndrome of left shoulder region 05/16/2020   HTN (hypertension) 04/23/2020   Positive fecal immunochemical test    Angiodysplasia of intestinal tract    Transient ischemic attack (TIA) 10/08/2019   Transient right leg weakness 09/30/2019   Peripheral neuropathy 08/10/2019   Primary osteoarthritis of left shoulder 07/24/2019   Elevated blood pressure reading 06/27/2019   Left shoulder pain 05/04/2019   Adjacent segment disease with spinal stenosis 04/13/2019   Anemia 03/19/2019   Long term current use of opiate analgesic 01/09/2019   Foraminal stenosis of lumbar region 11/14/2018   Chronic pain syndrome 09/12/2018   Shortness of breath 08/11/2018   Dysphagia 08/11/2018   Cough 06/30/2018   Rash 01/16/2018   Hyperlipidemia 10/18/2017   History of lumbar surgery 10/04/2017   Depression, recurrent ([place]) 09/27/2017   Lung nodule 09/27/2017   History of CVA (cerebrovascular accident) 09/12/2017   Cerebellar stroke, acute ([place]) 08/10/2017   Primary osteoarthritis of left hip 02/28/2017   Strain of left hip 02/28/2017   B12 deficiency 12/21/2016   Generalized OA 05/13/2016   Status post total hip replacement, left 04/22/2016   Personal history of colonic polyps    Greater trochanteric bursitis of both hips 01/09/2016   Prediabetes 08/04/2015   Thoracic aortic atherosclerosis ([place]) 06/16/2015   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   UTI (lower urinary tract infection) 11/22/2014   Low back pain 11/22/2014   Spondylolisthesis of lumbar region 11/18/2014   Lumbar radiculopathy 08/21/2014   Degeneration of lumbar or lumbosacral intervertebral disc 08/21/2014   Acute hip pain, left  08/21/2014   Osteopenia 06/23/2014   Major depression in remission ([place]) 06/23/2014   Anxiety 06/23/2014   Menopause 06/23/2014     Past Medical History:  Diagnosis Date   Arthritis    Atheromatous plaque 10/18/2017   Back pain    Cancer ([place])    skin   Depression    Diverticulosis    Dizziness    patient had episode of dizziness when came in room. hx past no dx   GERD (gastroesophageal reflux disease)    Heart murmur    Hyperlipidemia 10/18/2017   PONV (postoperative nausea and vomiting)    yrs ago   Squamous cell carcinoma of skin 11/16/2016   L chest parasternal   Squamous cell carcinoma of skin 02/05/2020   L cheek inf to lat zygoma   Stroke [facility])      Past Surgical History:  Procedure Laterality Date   BACK SURGERY  13   BREAST BIOPSY Bilateral    cores "years ago"   BREAST EXCISIONAL BIOPSY Left 90's   CATARACT EXTRACTION W/PHACO Left 12/20/2018   Procedure: CATARACT EXTRACTION PHACO AND INTRAOCULAR LENS PLACEMENT ([place]) LEFT TOPICAL;  Surgeon: Leandrew Koyanagi, MD;  Location: [facility];  Service: Ophthalmology;  Laterality: Left;   CATARACT EXTRACTION W/PHACO Right 01/24/2019   Procedure: CATARACT EXTRACTION PHACO AND INTRAOCULAR LENS PLACEMENT ([place]) RIGHT;  Surgeon: Leandrew Koyanagi, MD;  Location: [facility];  Service: Ophthalmology;  Laterality: Right;   CERVICAL DISC SURGERY  09   COLONOSCOPY WITH PROPOFOL N/A 01/13/2016   Procedure: COLONOSCOPY WITH PROPOFOL;  Surgeon: [name], MD;  Location: [facility] ENDOSCOPY;  Service: Endoscopy;  Laterality: N/A;   COLONOSCOPY WITH PROPOFOL N/A 02/19/2020   Procedure: COLONOSCOPY WITH PROPOFOL;  Surgeon: [name], MD;  Location: [facility] ENDOSCOPY;  Service: Gastroenterology;  Laterality: N/A;   FACELIFT  94   LOOP RECORDER INSERTION N/A 08/23/2017   Procedure: LOOP RECORDER INSERTION;  Surgeon: Thompson Grayer, MD;  Location: [facility] LAB;  Service: Cardiovascular;  Laterality: N/A;   TEE  WITHOUT CARDIOVERSION N/A 08/12/2017   Procedure: TRANSESOPHAGEAL ECHOCARDIOGRAM (TEE);  Surgeon: Sueanne Margarita, MD;  Location: [facility] ENDOSCOPY;  Service: Cardiovascular;  Laterality: N/A;   TONSILLECTOMY     TOTAL HIP ARTHROPLASTY Right 04/22/2016   Procedure: TOTAL HIP ARTHROPLASTY;  Surgeon: Corky Mull, MD;  Location: [facility] ORS;  Service: Orthopedics;  Laterality: Right;   TOTAL HIP ARTHROPLASTY Left 08/05/2021   Procedure: TOTAL HIP ARTHROPLASTY;  Surgeon: Corky Mull, MD;  Location: [facility] ORS;  Service: Orthopedics;  Laterality: Left;    Social History   Socioeconomic History   Marital status: Married    Spouse name: Nabiha Planck   Number of children: Not on file   Years of education: 12   Highest education level: 12th grade  Occupational History   Occupation: Retired  Tobacco Use   Smoking status: Former    Packs/day: 0.75    Years: 40.00    Pack years: 30.00    Types: Cigarettes    Quit date: 11/13/1996    Years since quitting: 25.0   Smokeless tobacco: Never  Vaping Use   Vaping Use: Never used  Substance and Sexual Activity   Alcohol use: Yes    Comment: occ wine   Drug use: No   Sexual activity: Not Currently  Other Topics Concern   Not on file  Social History Narrative   Not on file   Social Determinants of Health   Financial Resource Strain: Low Risk    Difficulty of Paying Living Expenses: Not hard at all  Food Insecurity: No Food Insecurity   Worried About Charity fundraiser in the Last Year: Never true   [place] in the Last Year: Never true  Transportation Needs: No Transportation Needs   Lack of Transportation (Medical): No   Lack of Transportation (Non-Medical): No  Physical Activity: Insufficiently Active   Days of Exercise per Week: 7 days   Minutes of Exercise per Session: 20 min  Stress: No Stress Concern Present   Feeling of Stress : Only a little  Social Connections: Moderately Integrated   Frequency of Communication with Friends  and Family: More than three times a week   Frequency of Social Gatherings with Friends and Family: More than three times a week   Attends Religious Services: More than 4 times per year   Active Member of [employer] or Organizations: No   Attends Archivist Meetings: Never   Marital Status: Married  Human resources officer Violence: Not At Risk   Fear of Current or Ex-Partner: No   Emotionally Abused: No   Physically Abused: No   Sexually Abused: No  Family History  Problem Relation Age of Onset   Lung cancer Father    Aneurysm Brother    Stroke Brother    Diabetes Maternal Grandmother    Kidney disease Maternal Grandfather    Cushing syndrome Paternal Grandmother    Dementia Paternal Grandfather    Breast cancer Maternal Aunt 11     Current Outpatient Medications:    acetaminophen (TYLENOL) 500 MG tablet, Take 1-2 tablets (500-1,000 mg total) by mouth every 6 (six) hours as needed., Disp: 60 tablet, Rfl: 0   albuterol (VENTOLIN HFA) 108 (90 Base) MCG/ACT inhaler, TAKE 2 PUFFS BY MOUTH EVERY 6 HOURS AS NEEDED FOR WHEEZE OR SHORTNESS OF BREATH, Disp: 8.5 each, Rfl: 2   atorvastatin (LIPITOR) 80 MG tablet, TAKE ONE TABLET BY MOUTH EVERY DAY, Disp: 90 tablet, Rfl: 3   buPROPion (WELLBUTRIN XL) 300 MG 24 hr tablet, TAKE 1 TABLET BY MOUTH DAILY, Disp: 90 tablet, Rfl: 1   clopidogrel (PLAVIX) 75 MG tablet, Take 1 tablet (75 mg total) by mouth daily., Disp: 90 tablet, Rfl: 3   docusate sodium (COLACE) 100 MG capsule, Take 100 mg by mouth 2 (two) times daily., Disp: , Rfl:    DULoxetine (CYMBALTA) 30 MG capsule, TAKE 1 CAPSULE BY MOUTH ONCE DAILY, Disp: 90 capsule, Rfl: 1   famotidine (PEPCID) 20 MG tablet, TAKE 1 TABLET BY MOUTH AT BEDTIME, Disp: 90 tablet, Rfl: 3   feeding supplement (ENSURE ENLIVE / ENSURE PLUS) LIQD, Take 237 mLs by mouth 3 (three) times daily between meals., Disp: 14220 mL, Rfl: 0   ipratropium-albuterol (DUONEB) 0.5-2.5 (3) MG/3ML SOLN, Take 3 mLs by nebulization  every 6 (six) hours as needed., Disp: 360 mL, Rfl: 11   pregabalin (LYRICA) 50 MG capsule, TAKE 1 CAPSULE (50 MG) BY MOUTH EVERY DAY AND TAKE 2 CAPSULES (100 MG) AT BEDTIME. Max daily dose 143m/day., Disp: 90 capsule, Rfl: 1   ferrous sulfate 325 (65 FE) MG tablet, Take 1 tablet (325 mg total) by mouth daily., Disp: 30 tablet, Rfl: 0   fluticasone (FLONASE) 50 MCG/ACT nasal spray, SPRAY 2 SPRAYS INTO EACH NOSTRIL EVERY DAY (Patient not taking: Reported on 12/09/2021), Disp: 48 mL, Rfl: 1   Physical exam:  Vitals:   12/09/21 1421 12/09/21 1434  BP:  106/62  Pulse:  79  Resp:  18  Temp:  97.8 F (36.6 C)  TempSrc:  Tympanic  SpO2:  100%  Weight: 173 lb (78.5 kg)    Physical Exam Constitutional:      Comments: She is elderly and sitting in a wheelchair.  She is on home oxygen  Cardiovascular:     Rate and Rhythm: Normal rate and regular rhythm.     Heart sounds: Normal heart sounds.  Pulmonary:     Effort: Pulmonary effort is normal.     Breath sounds: Normal breath sounds.  Abdominal:     General: Bowel sounds are normal. There is no distension.     Palpations: Abdomen is soft.     Tenderness: There is no abdominal tenderness.  Lymphadenopathy:     Comments: No palpable cervical adenopathy  Skin:    General: Skin is warm and dry.  Neurological:     Mental Status: She is alert and oriented to person, place, and time.       CMP Latest Ref Rng & Units 11/17/2021  Glucose 70 - 99 mg/dL 135(H)  BUN 8 - 23 mg/dL 14  Creatinine 0.44 - 1.00 mg/dL 0.89  Sodium 135 -  145 mmol/L 136  Potassium 3.5 - 5.1 mmol/L 3.7  Chloride 98 - 111 mmol/L 103  CO2 22 - 32 mmol/L 26  Calcium 8.9 - 10.3 mg/dL 8.5(L)  Total Protein 6.0 - 8.3 g/dL -  Total Bilirubin 0.2 - 1.2 mg/dL -  Alkaline Phos 39 - 117 U/L -  AST 0 - 37 U/L -  ALT 0 - 35 U/L -   CBC Latest Ref Rng & Units 12/09/2021  WBC 4.0 - 10.5 K/uL 9.4  Hemoglobin 12.0 - 15.0 g/dL 9.9(L)  Hematocrit 36.0 - 46.0 % 31.8(L)  Platelets  150 - 400 K/uL 258    No images are attached to the encounter.  DG Chest 2 View  Result Date: 11/17/2021 CLINICAL DATA:  Shortness of breath. EXAM: CHEST - 2 VIEW COMPARISON:  Radiographs 10/02/2021 and 08/08/2021.  CT 10/03/2021. FINDINGS: The heart size and mediastinal contours are stable with aortic atherosclerosis. There is stable chronic elevation/eventration of the right hemidiaphragm. There is chronic interstitial lung disease with diffuse fibrosis. No evidence of superimposed airspace disease, edema, pleural effusion or pneumothorax. There are degenerative changes throughout the spine status post cervical and lumbar fusion. No acute osseous findings are evident. Evidence of bilateral chronic rotator cuff tears. A loop recorder overlies the left anterior chest. IMPRESSION: Stable chronic fibrotic lung disease. No evidence of acute superimposed process. Electronically Signed   By: Richardean Sale M.D.   On: 11/17/2021 12:34    Assessment and plan- Patient is a 81 y.o. female referred for normocytic anemia  Suspect normocytic anemia secondary to anemia of chronic disease.  Her hemoglobin went down to 7.9 after her hip surgery and seems to be slowly trending back up.  Today I will do a complete anemia work-up including CBC ferritin iron studies B12 folate TSH reticulocyte count myeloma panel haptoglobin LDH and serum free light chains.  Video visit with me in 2 to 3 weeks time.  No indication for bone marrow biopsy at this time.  As long as her anemia is moderate not consistently declining, We can watch it conservatively.  She does not have any evidence of chronic kidney disease and therefore would not benefit from Epo   Thank you for this kind referral and the opportunity to participate in the care of this patient   Visit Diagnosis 1. Normocytic anemia     Dr. Randa Evens, MD, MPH [facility] at [facility] [phone] 12/09/2021

## 2021-12-09 NOTE — Progress Notes (Signed)
Patient reports SOB and feeling sleepy. She is on 3 liters of oxygen via nasal cannula

## 2021-12-10 ENCOUNTER — Other Ambulatory Visit: Payer: Self-pay

## 2021-12-10 ENCOUNTER — Ambulatory Visit: Payer: Medicare Other

## 2021-12-10 ENCOUNTER — Telehealth: Payer: Self-pay | Admitting: *Deleted

## 2021-12-10 DIAGNOSIS — Z111 Encounter for screening for respiratory tuberculosis: Secondary | ICD-10-CM

## 2021-12-10 LAB — HAPTOGLOBIN: Haptoglobin: 271 mg/dL (ref 41–333)

## 2021-12-10 LAB — TB SKIN TEST
Induration: 0 mm
TB Skin Test: NEGATIVE

## 2021-12-10 LAB — KAPPA/LAMBDA LIGHT CHAINS
Kappa free light chain: 59.5 mg/L — ABNORMAL HIGH (ref 3.3–19.4)
Kappa, lambda light chain ratio: 1.88 — ABNORMAL HIGH (ref 0.26–1.65)
Lambda free light chains: 31.7 mg/L — ABNORMAL HIGH (ref 5.7–26.3)

## 2021-12-10 NOTE — Telephone Encounter (Signed)
Called home phone on pt's acct and got her friend ruth pruitt. Did not get anyone on  the phone so I left message that Dr. Janese Banks states that pt b12 level is low and she rec: that pt get b12 injections monthly and it can be done at home or here in the office. Waiting a call back and left my direct number

## 2021-12-10 NOTE — Progress Notes (Signed)
Patient came in today for a PPD reading and It was negative and a letter was given to the patient with those results.  Melody Ochoa,cma

## 2021-12-10 NOTE — Progress Notes (Signed)
Attempted to call, no answer. Left voicemail. Can you please try again. Thanks

## 2021-12-13 DIAGNOSIS — J8410 Pulmonary fibrosis, unspecified: Secondary | ICD-10-CM | POA: Diagnosis not present

## 2021-12-14 ENCOUNTER — Telehealth: Payer: Self-pay | Admitting: *Deleted

## 2021-12-14 DIAGNOSIS — J8410 Pulmonary fibrosis, unspecified: Secondary | ICD-10-CM | POA: Diagnosis not present

## 2021-12-14 LAB — MULTIPLE MYELOMA PANEL, SERUM
Albumin SerPl Elph-Mcnc: 3.1 g/dL (ref 2.9–4.4)
Albumin/Glob SerPl: 0.9 (ref 0.7–1.7)
Alpha 1: 0.3 g/dL (ref 0.0–0.4)
Alpha2 Glob SerPl Elph-Mcnc: 1 g/dL (ref 0.4–1.0)
B-Globulin SerPl Elph-Mcnc: 0.9 g/dL (ref 0.7–1.3)
Gamma Glob SerPl Elph-Mcnc: 1.4 g/dL (ref 0.4–1.8)
Globulin, Total: 3.6 g/dL (ref 2.2–3.9)
IgA: 496 mg/dL — ABNORMAL HIGH (ref 64–422)
IgG (Immunoglobin G), Serum: 1344 mg/dL (ref 586–1602)
IgM (Immunoglobulin M), Srm: 155 mg/dL (ref 26–217)
Total Protein ELP: 6.7 g/dL (ref 6.0–8.5)

## 2021-12-14 NOTE — Telephone Encounter (Signed)
Called today and got voicemail and left message that her b12 level was low at 237. Dr.Rao wp=ould like her to take b12 inj either at home if possible or to come here to cancer center to get them monthly. Left my direct number to call us back.

## 2021-12-15 DIAGNOSIS — M6281 Muscle weakness (generalized): Secondary | ICD-10-CM | POA: Diagnosis not present

## 2021-12-17 ENCOUNTER — Telehealth: Payer: Self-pay | Admitting: *Deleted

## 2021-12-17 NOTE — Telephone Encounter (Signed)
-----   Message from Jorene Minors, RN sent at 12/10/2021 12:53 PM EST ----- Attempted to call, no answer. Left voicemail. Can you please try again. Thanks

## 2021-12-17 NOTE — Telephone Encounter (Signed)
Called pt today to see if I can reach her to discuss her b12 level. She answered the phone and I told her about b12 levels low and dr Janese Banks would like to start giving her b12 inj monthly . She could do at home if she wants or here at cancer center. Per pt. She will need to come to cancer center and  get it. I have told her that jennifer the scheduler will call her for one mid week and has to be afternoon per pt. She will get that appt on telephone and then we will make future appts and mail it to her and she is ok with this

## 2021-12-17 NOTE — Progress Notes (Signed)
Pt answered the phone and let me know that she would like b12 inj at cancer center and she wants to start them mid week and afternoon only. Anderson Malta will call pt back with next week appt. We  will make future ones after that and mail the next few inj. To her. Pt agreeable to that

## 2021-12-23 ENCOUNTER — Inpatient Hospital Stay: Payer: Medicare Other

## 2021-12-24 ENCOUNTER — Other Ambulatory Visit: Payer: Medicare Other | Admitting: Nurse Practitioner

## 2021-12-25 DIAGNOSIS — M25521 Pain in right elbow: Secondary | ICD-10-CM | POA: Diagnosis not present

## 2021-12-25 DIAGNOSIS — M25511 Pain in right shoulder: Secondary | ICD-10-CM | POA: Diagnosis not present

## 2021-12-29 ENCOUNTER — Telehealth: Payer: Self-pay

## 2021-12-29 ENCOUNTER — Telehealth: Payer: Self-pay | Admitting: Pulmonary Disease

## 2021-12-29 NOTE — Telephone Encounter (Signed)
Per Dr. Patsey Berthold verbally- patient is past due for 2-49mo rov.  Lm to schedule OV.
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
## 2021-12-30 NOTE — Telephone Encounter (Signed)
Lm x2 for patient Will close encounter per office protocol. Letter mailed to address on file.    

## 2021-12-30 NOTE — Telephone Encounter (Signed)
Noted Appt 01/01/22 with me

## 2022-01-01 ENCOUNTER — Ambulatory Visit: Payer: Medicare Other | Admitting: Family

## 2022-01-01 ENCOUNTER — Telehealth: Payer: Self-pay | Admitting: *Deleted

## 2022-01-01 ENCOUNTER — Encounter: Payer: Self-pay | Admitting: Family

## 2022-01-01 NOTE — Telephone Encounter (Signed)
Rod Holler POA called to check about b12 inj. The pt was is in asst living. Her husband is with her to. They are try to transition to another assted living that has memory care and then standard asst living. She will call me and let me know when she gets in new place and Rod Holler will let me know and then I cna send order to new facility to start b12 inj. POA is good with this

## 2022-01-04 ENCOUNTER — Telehealth: Payer: Medicare Other | Admitting: Oncology

## 2022-01-04 ENCOUNTER — Inpatient Hospital Stay: Payer: Medicare Other | Admitting: Oncology

## 2022-01-05 ENCOUNTER — Encounter: Payer: Self-pay | Admitting: Family

## 2022-01-05 NOTE — Telephone Encounter (Signed)
Patients POA calling in regards to previous message. Patients POA wanted to make sure PCP/CMA received the message.

## 2022-01-07 ENCOUNTER — Telehealth: Payer: Self-pay | Admitting: Family

## 2022-01-07 DIAGNOSIS — M5416 Radiculopathy, lumbar region: Secondary | ICD-10-CM

## 2022-01-07 NOTE — Telephone Encounter (Signed)
[facility] called in requesting a refill for pregabalin 50 MG

## 2022-01-07 NOTE — Telephone Encounter (Signed)
Pt POA called in requesting for transfer of facility form to be filled out my NP Arnett. Pt POA stated that she would like to transfer Pt out of [place] to [facility]. Pt POA requesting callback when paperwork is  finish

## 2022-01-07 NOTE — Telephone Encounter (Signed)
Forms are in providers basket in front office

## 2022-01-08 ENCOUNTER — Encounter: Payer: Self-pay | Admitting: Family

## 2022-01-11 NOTE — Telephone Encounter (Signed)
Pt POA called in requesting update on second part of FL2 paperwork. Pt POA stated that the pt can not move into the assist living until paperwork is all completed. Pt POA stated that the pharmacy need approval on medication (pregabalin (LYRICA) 50 MG capsule). Pt POA requesting callback

## 2022-01-11 NOTE — Telephone Encounter (Signed)
I called and spoke with McKenzie at [facility] she stated that she has reached out to Dr. Elwyn Lade office 3 x with no response. She reached out to [place] to see if we could fill for patient?

## 2022-01-12 ENCOUNTER — Other Ambulatory Visit: Payer: Self-pay | Admitting: Family

## 2022-01-12 MED ORDER — PREGABALIN 50 MG PO CAPS: ORAL_CAPSULE | ORAL | 2 refills | Status: AC

## 2022-01-12 NOTE — Telephone Encounter (Signed)
Call pt I have refilled lyrica with 2 refills Ensure pt has f/u with me ( 3 months from last)  I signed form as well and placed on your desk

## 2022-01-12 NOTE — Telephone Encounter (Signed)
I have sent a message to Melody Ochoa letting her know below.

## 2022-01-13 DIAGNOSIS — J8410 Pulmonary fibrosis, unspecified: Secondary | ICD-10-CM | POA: Diagnosis not present

## 2022-01-14 DIAGNOSIS — J8410 Pulmonary fibrosis, unspecified: Secondary | ICD-10-CM | POA: Diagnosis not present

## 2022-01-15 DIAGNOSIS — M6281 Muscle weakness (generalized): Secondary | ICD-10-CM | POA: Diagnosis not present

## 2022-01-18 NOTE — Telephone Encounter (Signed)
Melody Ochoa, [place], [phone] called about FMLA , Medication must be written on the FMLA paper work, no attachments allowed. Please fix and resend patient is moving today. Thanks.

## 2022-01-19 ENCOUNTER — Other Ambulatory Visit: Payer: Medicare Other | Admitting: Nurse Practitioner

## 2022-01-20 ENCOUNTER — Ambulatory Visit: Payer: Medicare Other | Admitting: Family

## 2022-01-20 ENCOUNTER — Inpatient Hospital Stay: Payer: Medicare Other

## 2022-01-22 ENCOUNTER — Ambulatory Visit (INDEPENDENT_AMBULATORY_CARE_PROVIDER_SITE_OTHER): Payer: Medicare Other | Admitting: Family

## 2022-01-22 ENCOUNTER — Other Ambulatory Visit: Payer: Self-pay

## 2022-01-22 ENCOUNTER — Encounter: Payer: Self-pay | Admitting: Family

## 2022-01-22 VITALS — BP 126/82 | HR 57 | Temp 98.8°F | Ht 60.0 in | Wt 163.9 lb

## 2022-01-22 DIAGNOSIS — G8929 Other chronic pain: Secondary | ICD-10-CM | POA: Diagnosis not present

## 2022-01-22 DIAGNOSIS — R0982 Postnasal drip: Secondary | ICD-10-CM | POA: Diagnosis not present

## 2022-01-22 DIAGNOSIS — R059 Cough, unspecified: Secondary | ICD-10-CM | POA: Diagnosis not present

## 2022-01-22 DIAGNOSIS — R0981 Nasal congestion: Secondary | ICD-10-CM | POA: Diagnosis not present

## 2022-01-22 DIAGNOSIS — J8410 Pulmonary fibrosis, unspecified: Secondary | ICD-10-CM | POA: Diagnosis not present

## 2022-01-22 DIAGNOSIS — M545 Low back pain, unspecified: Secondary | ICD-10-CM | POA: Diagnosis not present

## 2022-01-22 MED ORDER — GUAIFENESIN ER 600 MG PO TB12
1200.0000 mg | ORAL_TABLET | Freq: Two times a day (BID) | ORAL | 3 refills | Status: AC | PRN
Start: 2022-01-22 — End: ?

## 2022-01-22 MED ORDER — IPRATROPIUM-ALBUTEROL 0.5-2.5 (3) MG/3ML IN SOLN: 3.0000 mL | Freq: Four times a day (QID) | RESPIRATORY_TRACT | 11 refills | Status: AC | PRN

## 2022-01-22 MED ORDER — FLUTICASONE PROPIONATE 50 MCG/ACT NA SUSP: NASAL | 1 refills | Status: AC

## 2022-01-22 NOTE — Patient Instructions (Signed)
I have refilled Flonase.  Please start Flonase as this will help with the postnasal drip.    I have also sent in Mucinex which is over-the-counter, but I prescribed it to make it easier for you.  Mucinex is an expectorant that will help break up thick congestion.  Please take it with plenty of water   I also refilled your DuoNeb.  Please continue very close follow-up with Dr. Patsey Berthold, pulmonology.  We will forward to seeing you in 3 months

## 2022-01-22 NOTE — Assessment & Plan Note (Signed)
Benign exam.  Symptoms consistent with PND.  patient's chronic shortness of breath is at baseline today.  No acute respiratory distress.  Advised her to resume Flonase which I have sent to her pharmacy.  I will also send in Mucinex for her to start for thick congestion.  She will let me know if no resolution of symptoms.

## 2022-01-22 NOTE — Assessment & Plan Note (Signed)
Chronic, very stable at this time.  Continue Cymbalta 30 mg,Lyrica 50mg  qam, 100mg  qpm.  Patient to continue following with Dr. Holley Raring, pain management.  Will follow.

## 2022-01-22 NOTE — Progress Notes (Signed)
Subjective:    Patient ID: Melody Ochoa, female    DOB: October 14, 1941, 81 y.o.   MRN: [id number]  CC: Melody Ochoa is a 81 y.o. female who presents today for follow up.   HPI: Overall feels well today.    She doesn't state that is clearing her throat more often and with congestion for couple of weeks, symptoms unchanged. She is out of flonase.  She has not tried any medication for this  No fever,  cough, ear pain, trouble swallowing, epigastric burning, oral lesions, CP   Noted DOE is activity has increased over time.  She requests nebulizer machine. She stay on 3L oxygen all the time.    Rod Holler, [place] accompanies her She is living at [facility] and enjoying living there.  Husband is in memory care   Chronic low back pain-compliant with Lyrica 50mg  qam, 100mg  qpm, cymbalta 30mg   Consult with Dr. Janese Banks 12/09/2021 for normocytic anemia which she suspects is secondary to anemia of chronic disease.    Follow-up scheduled with oncology 01/25/2022.   She has follow-up scheduled with Dr. Patsey Berthold 02/11/2022 HISTORY:  Past Medical History:  Diagnosis Date   Arthritis    Atheromatous plaque 10/18/2017   Back pain    Cancer ([place])    skin   Depression    Diverticulosis    Dizziness    patient had episode of dizziness when came in room. hx past no dx   GERD (gastroesophageal reflux disease)    Heart murmur    Hyperlipidemia 10/18/2017   PONV (postoperative nausea and vomiting)    yrs ago   Squamous cell carcinoma of skin 11/16/2016   L chest parasternal   Squamous cell carcinoma of skin 02/05/2020   L cheek inf to lat zygoma   Stroke [facility])    Past Surgical History:  Procedure Laterality Date   BACK SURGERY  13   BREAST BIOPSY Bilateral    cores "years ago"   BREAST EXCISIONAL BIOPSY Left 90's   CATARACT EXTRACTION W/PHACO Left 12/20/2018   Procedure: CATARACT EXTRACTION PHACO AND INTRAOCULAR LENS PLACEMENT ([place]) LEFT TOPICAL;  Surgeon: Leandrew Koyanagi, MD;   Location: [place];  Service: Ophthalmology;  Laterality: Left;   CATARACT EXTRACTION W/PHACO Right 01/24/2019   Procedure: CATARACT EXTRACTION PHACO AND INTRAOCULAR LENS PLACEMENT ([place]) RIGHT;  Surgeon: Leandrew Koyanagi, MD;  Location: [place];  Service: Ophthalmology;  Laterality: Right;   CERVICAL DISC SURGERY  09   COLONOSCOPY WITH PROPOFOL N/A 01/13/2016   Procedure: COLONOSCOPY WITH PROPOFOL;  Surgeon: [name], MD;  Location: [facility] ENDOSCOPY;  Service: Endoscopy;  Laterality: N/A;   COLONOSCOPY WITH PROPOFOL N/A 02/19/2020   Procedure: COLONOSCOPY WITH PROPOFOL;  Surgeon: [name], MD;  Location: [facility] ENDOSCOPY;  Service: Gastroenterology;  Laterality: N/A;   FACELIFT  94   LOOP RECORDER INSERTION N/A 08/23/2017   Procedure: LOOP RECORDER INSERTION;  Surgeon: Thompson Grayer, MD;  Location: [place] [facility];  Service: Cardiovascular;  Laterality: N/A;   TEE WITHOUT CARDIOVERSION N/A 08/12/2017   Procedure: TRANSESOPHAGEAL ECHOCARDIOGRAM (TEE);  Surgeon: Sueanne Margarita, MD;  Location: [facility] ENDOSCOPY;  Service: Cardiovascular;  Laterality: N/A;   TONSILLECTOMY     TOTAL HIP ARTHROPLASTY Right 04/22/2016   Procedure: TOTAL HIP ARTHROPLASTY;  Surgeon: Corky Mull, MD;  Location: [facility] ORS;  Service: Orthopedics;  Laterality: Right;   TOTAL HIP ARTHROPLASTY Left 08/05/2021   Procedure: TOTAL HIP ARTHROPLASTY;  Surgeon: Corky Mull, MD;  Location: [facility]  ORS;  Service: Orthopedics;  Laterality: Left;   Family History  Problem Relation Age of Onset   Lung cancer Father    Aneurysm Brother    Stroke Brother    Diabetes Maternal Grandmother    Kidney disease Maternal Grandfather    Cushing syndrome Paternal Grandmother    Dementia Paternal Grandfather    Breast cancer Maternal Aunt 52    Allergies: Latex, Aleve [naproxen sodium], Aspirin, Celebrex [celecoxib], Effexor [venlafaxine], Gabapentin, Hydrocodone bit-homatrop mbr, Ibuprofen, Mobic [meloxicam], Tape, Vioxx  [rofecoxib], Other, and Tramadol Current Outpatient Medications on File Prior to Visit  Medication Sig Dispense Refill   acetaminophen (TYLENOL) 500 MG tablet Take 1-2 tablets (500-1,000 mg total) by mouth every 6 (six) hours as needed. 60 tablet 0   albuterol (VENTOLIN HFA) 108 (90 Base) MCG/ACT inhaler TAKE 2 PUFFS BY MOUTH EVERY 6 HOURS AS NEEDED FOR WHEEZE OR SHORTNESS OF BREATH 8.5 each 2   atorvastatin (LIPITOR) 80 MG tablet TAKE ONE TABLET BY MOUTH EVERY DAY 90 tablet 3   buPROPion (WELLBUTRIN XL) 300 MG 24 hr tablet TAKE 1 TABLET BY MOUTH DAILY 90 tablet 1   clopidogrel (PLAVIX) 75 MG tablet TAKE ONE TABLET BY MOUTH EVERY DAY 90 tablet 3   docusate sodium (COLACE) 100 MG capsule Take 100 mg by mouth 2 (two) times daily.     DULoxetine (CYMBALTA) 30 MG capsule TAKE 1 CAPSULE BY MOUTH ONCE DAILY 90 capsule 1   famotidine (PEPCID) 20 MG tablet TAKE 1 TABLET BY MOUTH AT BEDTIME 90 tablet 3   feeding supplement (ENSURE ENLIVE / ENSURE PLUS) LIQD Take 237 mLs by mouth 3 (three) times daily between meals. 14220 mL 0   pregabalin (LYRICA) 50 MG capsule TAKE 1 CAPSULE (50 MG) BY MOUTH EVERY DAY AND TAKE 2 CAPSULES (100 MG) AT BEDTIME. Max daily dose 150mg /day. 90 capsule 2   ferrous sulfate 325 (65 FE) MG tablet Take 1 tablet (325 mg total) by mouth daily. 30 tablet 0   No current facility-administered medications on file prior to visit.    Social History   Tobacco Use   Smoking status: Former    Packs/day: 0.75    Years: 40.00    Pack years: 30.00    Types: Cigarettes    Quit date: 11/13/1996    Years since quitting: 25.2   Smokeless tobacco: Never  Vaping Use   Vaping Use: Never used  Substance Use Topics   Alcohol use: Yes    Comment: occ wine   Drug use: No    Review of Systems  Constitutional:  Negative for chills and fever.  HENT:  Positive for congestion and postnasal drip. Negative for sinus pressure, sore throat and trouble swallowing.   Respiratory:  Positive for  shortness of breath (chronic stable). Negative for cough.   Cardiovascular:  Negative for chest pain and palpitations.  Gastrointestinal:  Negative for nausea and vomiting.  Musculoskeletal:  Positive for back pain (chronic, stable).     Objective:    BP 126/82 (BP Location: Left Arm, Patient Position: Sitting, Cuff Size: Large)    Pulse (!) 57    Temp 98.8 F (37.1 C) (Oral)    Ht 5' (1.524 m)    Wt 163 lb 14.4 oz (74.3 kg)    SpO2 93%    BMI 32.01 kg/m  BP Readings from Last 3 Encounters:  01/22/22 126/82  12/09/21 106/62  12/01/21 116/64   Wt Readings from Last 3 Encounters:  01/22/22 163 lb 14.4  oz (74.3 kg)  12/09/21 173 lb (78.5 kg)  12/01/21 173 lb 9.6 oz (78.7 kg)    Physical Exam Vitals reviewed.  Constitutional:      Appearance: She is well-developed.  HENT:     Head: Normocephalic and atraumatic.     Right Ear: Hearing, tympanic membrane, ear canal and external ear normal. No decreased hearing noted. No drainage, swelling or tenderness. No middle ear effusion. No foreign body. Tympanic membrane is not erythematous or bulging.     Left Ear: Hearing, tympanic membrane, ear canal and external ear normal. No decreased hearing noted. No drainage, swelling or tenderness.  No middle ear effusion. No foreign body. Tympanic membrane is not erythematous or bulging.     Nose: Nose normal. No rhinorrhea.     Right Sinus: No maxillary sinus tenderness or frontal sinus tenderness.     Left Sinus: No maxillary sinus tenderness or frontal sinus tenderness.     Mouth/Throat:     Pharynx: Uvula midline. No oropharyngeal exudate or posterior oropharyngeal erythema.     Tonsils: No tonsillar abscesses.  Eyes:     Conjunctiva/sclera: Conjunctivae normal.  Cardiovascular:     Rate and Rhythm: Regular rhythm.     Pulses: Normal pulses.     Heart sounds: Normal heart sounds.  Pulmonary:     Effort: Pulmonary effort is normal.     Breath sounds: Normal breath sounds. No wheezing, rhonchi  or rales.  Lymphadenopathy:     Head:     Right side of head: No submental, submandibular, tonsillar, preauricular, posterior auricular or occipital adenopathy.     Left side of head: No submental, submandibular, tonsillar, preauricular, posterior auricular or occipital adenopathy.     Cervical: No cervical adenopathy.  Skin:    General: Skin is warm and dry.  Neurological:     Mental Status: She is alert.  Psychiatric:        Speech: Speech normal.        Behavior: Behavior normal.        Thought Content: Thought content normal.       Assessment & Plan:   Problem List Items Addressed This Visit       Respiratory   Pulmonary fibrosis (HCC)    Chronic, symptomatically stable.  She is compliant with 3 L continuous oxygen.  She request refill of DuoNeb today which I provided.  She has upcoming appointment with Dr. Patsey Berthold, pulmonology.  We will follow        Other   Cough   Relevant Medications   fluticasone (FLONASE) 50 MCG/ACT nasal spray   Low back pain    Chronic, very stable at this time.  Continue Cymbalta 30 mg,Lyrica 50mg  qam, 100mg  qpm.  Patient to continue following with Dr. Holley Raring, pain management.  Will follow.       Nasal congestion    Benign exam.  Symptoms consistent with PND.  patient's chronic shortness of breath is at baseline today.  No acute respiratory distress.  Advised her to resume Flonase which I have sent to her pharmacy.  I will also send in Mucinex for her to start for thick congestion.  She will let me know if no resolution of symptoms.      PND (post-nasal drip) - Primary   Relevant Medications   fluticasone (FLONASE) 50 MCG/ACT nasal spray   guaiFENesin (MUCINEX) 600 MG 12 hr tablet   ipratropium-albuterol (DUONEB) 0.5-2.5 (3) MG/3ML SOLN     I am having Melody Ochoa.  Mower start on guaiFENesin. I am also having her maintain her acetaminophen, feeding supplement, ferrous sulfate, albuterol, docusate sodium, atorvastatin, famotidine,  buPROPion, DULoxetine, pregabalin, clopidogrel, fluticasone, and ipratropium-albuterol.   Meds ordered this encounter  Medications   fluticasone (FLONASE) 50 MCG/ACT nasal spray    Sig: SPRAY 2 SPRAYS INTO EACH NOSTRIL EVERY DAY    Dispense:  48 mL    Refill:  1    Order Specific Question:   Supervising Provider    Answer:   Deborra Medina L [[id number]]   guaiFENesin (MUCINEX) 600 MG 12 hr tablet    Sig: Take 2 tablets (1,200 mg total) by mouth 2 (two) times daily as needed. For back pain    Dispense:  28 tablet    Refill:  3    Order Specific Question:   Supervising Provider    Answer:   Deborra Medina L [[id number]]   ipratropium-albuterol (DUONEB) 0.5-2.5 (3) MG/3ML SOLN    Sig: Take 3 mLs by nebulization every 6 (six) hours as needed.    Dispense:  360 mL    Refill:  11    Order Specific Question:   Supervising Provider    Answer:   Crecencio Mc [[id number]]    Return precautions given.   Risks, benefits, and alternatives of the medications and treatment plan prescribed today were discussed, and patient expressed understanding.   Education regarding symptom management and diagnosis given to patient on AVS.  Continue to follow with Burnard Hawthorne, FNP for routine health maintenance.   [name] and I agreed with plan.   [name], FNP

## 2022-01-22 NOTE — Assessment & Plan Note (Signed)
Chronic, symptomatically stable.  She is compliant with 3 L continuous oxygen.  She request refill of DuoNeb today which I provided.  She has upcoming appointment with Dr. Patsey Berthold, pulmonology.  We will follow

## 2022-01-24 DIAGNOSIS — R278 Other lack of coordination: Secondary | ICD-10-CM | POA: Diagnosis not present

## 2022-01-24 DIAGNOSIS — M6281 Muscle weakness (generalized): Secondary | ICD-10-CM | POA: Diagnosis not present

## 2022-01-24 DIAGNOSIS — R296 Repeated falls: Secondary | ICD-10-CM | POA: Diagnosis not present

## 2022-01-24 DIAGNOSIS — I639 Cerebral infarction, unspecified: Secondary | ICD-10-CM | POA: Diagnosis not present

## 2022-01-25 ENCOUNTER — Inpatient Hospital Stay: Payer: Medicare Other | Attending: Oncology | Admitting: Oncology

## 2022-01-25 ENCOUNTER — Other Ambulatory Visit: Payer: Self-pay

## 2022-01-25 DIAGNOSIS — N1831 Chronic kidney disease, stage 3a: Secondary | ICD-10-CM | POA: Diagnosis not present

## 2022-01-25 DIAGNOSIS — E538 Deficiency of other specified B group vitamins: Secondary | ICD-10-CM | POA: Diagnosis not present

## 2022-01-25 DIAGNOSIS — Z87891 Personal history of nicotine dependence: Secondary | ICD-10-CM

## 2022-01-25 DIAGNOSIS — D649 Anemia, unspecified: Secondary | ICD-10-CM

## 2022-01-25 DIAGNOSIS — Z801 Family history of malignant neoplasm of trachea, bronchus and lung: Secondary | ICD-10-CM | POA: Diagnosis not present

## 2022-01-25 DIAGNOSIS — I129 Hypertensive chronic kidney disease with stage 1 through stage 4 chronic kidney disease, or unspecified chronic kidney disease: Secondary | ICD-10-CM

## 2022-01-25 DIAGNOSIS — W19XXXA Unspecified fall, initial encounter: Secondary | ICD-10-CM | POA: Diagnosis not present

## 2022-01-25 DIAGNOSIS — Z803 Family history of malignant neoplasm of breast: Secondary | ICD-10-CM

## 2022-01-25 MED ORDER — VITAMIN B-12 1000 MCG PO TABS: 1000.0000 ug | ORAL_TABLET | Freq: Every day | ORAL | 2 refills | Status: DC

## 2022-01-25 NOTE — Progress Notes (Signed)
Pt c/o pain to right shoulder s/p fall. Has followed up with provider. No broken bones. Multiple falls in the last 3 months. Denies any other questions/concerns at this time.

## 2022-01-25 NOTE — Progress Notes (Signed)
Hematology/Oncology Consult note [facility] [phone] [phone]  Patient Care Team: Burnard Hawthorne as PCP - General (Family Medicine) Deboraha Sprang, MD as PCP - Electrophysiology (Cardiology)   Name of the patient: Melody Ochoa  [id number]  1940/12/10    Reason for referral-anemia   Referring [id number], FNP  Date of visit: 01/25/22  I connected with Melody Ochoa on 01/25/22 at  2:45 PM EST by telephone visit and verified that I am speaking with the correct person using two identifiers.   I discussed the limitations, risks, security and privacy concerns of performing an evaluation and management service by telemedicine and the availability of in-person appointments. I also discussed with the patient that there may be a patient responsible charge related to this service. The patient expressed understanding and agreed to proceed.   Other persons participating in the visit and their role in the encounter: NP, Patient    Patients location: Home  Providers location: Clinic     History of presenting illness-Patient is a 81 year old female with a past medical history significant for interstitial lung disease on home oxygen, hypertension hyperlipidemia, history of CVA who has been referred to [place] for anemia.  Looking back at her prior CBCs patient has had chronic normocytic anemia with a hemoglobin that has been around 10-11 for the last 3 years.  White cell count and platelets have been normal.  Patient underwent hip surgery sometime in August 2022 and following that her hemoglobin dropped down to 7.9 and then subsequently came back up to 9.  Iron studies back in October 2022 were indicative of anemia of chronic disease.  Patient lives alone and has a 24-hour caregiver.  Interval History- Recently moved from home to [facility] d/t increasing help needed at home.  Requiring 24-hour memory care.  States she is  doing well.  She fell last night tripping over her walker. States she fell on her right knee.   Reports no bleeding.  Having some frequent urination.  Overall feels stable.  ECOG PS- 3  Pain scale- 0   Review of systems- Review of Systems  Constitutional:  Positive for malaise/fatigue.  Musculoskeletal:  Positive for falls.  Neurological:  Positive for weakness.  Psychiatric/Behavioral:  Positive for memory loss.    Allergies  Allergen Reactions   Latex Itching   Aleve [Naproxen Sodium] Other (See Comments)    Gi upset   Aspirin Other (See Comments)    Stomach pain-aggravates diverticulosis   Celebrex [Celecoxib] Other (See Comments)    Dizziness    Effexor [Venlafaxine] Other (See Comments)    Hot flashes    Gabapentin Other (See Comments)    "Night terrors"   Hydrocodone Bit-Homatrop Mbr Diarrhea   Ibuprofen Other (See Comments)    Gi upset    Mobic [Meloxicam]     Stomach upset   Tape Itching and Other (See Comments)    Itchy blisters   Vioxx [Rofecoxib] Other (See Comments)    Gi upset   Other Rash and Other (See Comments)    bandaides   Tramadol Other (See Comments)    hallucinations    Patient Active Problem List   Diagnosis Date Noted   PND (post-nasal drip) 01/22/2022   Nasal congestion 11/12/2021   Diarrhea 11/12/2021   Upper respiratory tract infection 11/12/2021   Pain in joint of right elbow 11/09/2021   Weakness    Confusion    Hypotension    Acute blood loss anemia  Irritation of left eye    Acute respiratory failure with hypoxia (HCC)    Interstitial lung disease (HCC)    Gastroesophageal reflux disease without esophagitis    Leg swelling 07/13/2021   Intercostal neuralgia 04/16/2021   Multiple closed fractures of ribs of left side 04/16/2021   Adenoma of left adrenal gland 12/25/2020   Ischemic stroke ([place]) 12/10/2020   Chronic kidney disease, stage 3a ([place]) 12/09/2020   Chronic respiratory failure with hypoxia ([place]) 12/09/2020    Fall at home, initial encounter 12/09/2020   Right shoulder pain 12/09/2020   Memory deficit 09/24/2020   Pulmonary fibrosis ([place]) 09/14/2020   Chronic cough 09/14/2020   Arthritis of left sacroiliac joint 08/26/2020   Chronic left SI joint pain 08/26/2020   CKD (chronic kidney disease) 07/23/2020   Impingement syndrome of left shoulder region 05/16/2020   HTN (hypertension) 04/23/2020   Positive fecal immunochemical test    Angiodysplasia of intestinal tract    Transient ischemic attack (TIA) 10/08/2019   Transient right leg weakness 09/30/2019   Peripheral neuropathy 08/10/2019   Primary osteoarthritis of left shoulder 07/24/2019   Elevated blood pressure reading 06/27/2019   Left shoulder pain 05/04/2019   Adjacent segment disease with spinal stenosis 04/13/2019   Anemia 03/19/2019   Long term current use of opiate analgesic 01/09/2019   Foraminal stenosis of lumbar region 11/14/2018   Chronic pain syndrome 09/12/2018   Shortness of breath 08/11/2018   Dysphagia 08/11/2018   Cough 06/30/2018   Rash 01/16/2018   Hyperlipidemia 10/18/2017   History of lumbar surgery 10/04/2017   Depression, recurrent ([place]) 09/27/2017   Lung nodule 09/27/2017   History of CVA (cerebrovascular accident) 09/12/2017   Cerebellar stroke, acute ([place]) 08/10/2017   Primary osteoarthritis of left hip 02/28/2017   Strain of left hip 02/28/2017   B12 deficiency 12/21/2016   Generalized OA 05/13/2016   Status post total hip replacement, left 04/22/2016   Personal history of colonic polyps    Greater trochanteric bursitis of both hips 01/09/2016   Prediabetes 08/04/2015   Thoracic aortic atherosclerosis ([place]) 06/16/2015   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   UTI (lower urinary tract infection) 11/22/2014   Low back pain 11/22/2014   Spondylolisthesis of lumbar region 11/18/2014   Lumbar radiculopathy 08/21/2014   Degeneration of lumbar or lumbosacral intervertebral disc 08/21/2014   Acute  hip pain, left 08/21/2014   Osteopenia 06/23/2014   Major depression in remission ([place]) 06/23/2014   Anxiety 06/23/2014   Menopause 06/23/2014     Past Medical History:  Diagnosis Date   Arthritis    Atheromatous plaque 10/18/2017   Back pain    Cancer ([place])    skin   Depression    Diverticulosis    Dizziness    patient had episode of dizziness when came in room. hx past no dx   GERD (gastroesophageal reflux disease)    Heart murmur    Hyperlipidemia 10/18/2017   PONV (postoperative nausea and vomiting)    yrs ago   Squamous cell carcinoma of skin 11/16/2016   L chest parasternal   Squamous cell carcinoma of skin 02/05/2020   L cheek inf to lat zygoma   Stroke [facility])      Past Surgical History:  Procedure Laterality Date   BACK SURGERY  13   BREAST BIOPSY Bilateral    cores "years ago"   BREAST EXCISIONAL BIOPSY Left 90's   CATARACT EXTRACTION W/PHACO Left 12/20/2018   Procedure: CATARACT EXTRACTION PHACO AND INTRAOCULAR  LENS PLACEMENT (IOC) LEFT TOPICAL;  Surgeon: Leandrew Koyanagi, MD;  Location: [place];  Service: Ophthalmology;  Laterality: Left;   CATARACT EXTRACTION W/PHACO Right 01/24/2019   Procedure: CATARACT EXTRACTION PHACO AND INTRAOCULAR LENS PLACEMENT ([place]) RIGHT;  Surgeon: Leandrew Koyanagi, MD;  Location: [place];  Service: Ophthalmology;  Laterality: Right;   CERVICAL DISC SURGERY  09   COLONOSCOPY WITH PROPOFOL N/A 01/13/2016   Procedure: COLONOSCOPY WITH PROPOFOL;  Surgeon: [name], MD;  Location: [facility] ENDOSCOPY;  Service: Endoscopy;  Laterality: N/A;   COLONOSCOPY WITH PROPOFOL N/A 02/19/2020   Procedure: COLONOSCOPY WITH PROPOFOL;  Surgeon: [name], MD;  Location: [facility] ENDOSCOPY;  Service: Gastroenterology;  Laterality: N/A;   FACELIFT  94   LOOP RECORDER INSERTION N/A 08/23/2017   Procedure: LOOP RECORDER INSERTION;  Surgeon: Thompson Grayer, MD;  Location: [place] [facility];  Service: Cardiovascular;  Laterality:  N/A;   TEE WITHOUT CARDIOVERSION N/A 08/12/2017   Procedure: TRANSESOPHAGEAL ECHOCARDIOGRAM (TEE);  Surgeon: Sueanne Margarita, MD;  Location: [facility] ENDOSCOPY;  Service: Cardiovascular;  Laterality: N/A;   TONSILLECTOMY     TOTAL HIP ARTHROPLASTY Right 04/22/2016   Procedure: TOTAL HIP ARTHROPLASTY;  Surgeon: Corky Mull, MD;  Location: [facility] ORS;  Service: Orthopedics;  Laterality: Right;   TOTAL HIP ARTHROPLASTY Left 08/05/2021   Procedure: TOTAL HIP ARTHROPLASTY;  Surgeon: Corky Mull, MD;  Location: [facility] ORS;  Service: Orthopedics;  Laterality: Left;    Social History   Socioeconomic History   Marital status: Married    Spouse name: Alaisha Eversley   Number of children: Not on file   Years of education: 12   Highest education level: 12th grade  Occupational History   Occupation: Retired  Tobacco Use   Smoking status: Former    Packs/day: 0.75    Years: 40.00    Pack years: 30.00    Types: Cigarettes    Quit date: 11/13/1996    Years since quitting: 25.2   Smokeless tobacco: Never  Vaping Use   Vaping Use: Never used  Substance and Sexual Activity   Alcohol use: Yes    Comment: occ wine   Drug use: No   Sexual activity: Not Currently  Other Topics Concern   Not on file  Social History Narrative   Not on file   Social Determinants of Health   Financial Resource Strain: Low Risk    Difficulty of Paying Living Expenses: Not hard at all  Food Insecurity: No Food Insecurity   Worried About Charity fundraiser in the Last Year: Never true   [place] in the Last Year: Never true  Transportation Needs: No Transportation Needs   Lack of Transportation (Medical): No   Lack of Transportation (Non-Medical): No  Physical Activity: Insufficiently Active   Days of Exercise per Week: 7 days   Minutes of Exercise per Session: 20 min  Stress: No Stress Concern Present   Feeling of Stress : Only a little  Social Connections: Moderately Integrated   Frequency of Communication  with Friends and Family: More than three times a week   Frequency of Social Gatherings with Friends and Family: More than three times a week   Attends Religious Services: More than 4 times per year   Active Member of [employer] or Organizations: No   Attends Archivist Meetings: Never   Marital Status: Married  Human resources officer Violence: Not At Risk   Fear of Current or Ex-Partner: No   Emotionally  Abused: No   Physically Abused: No   Sexually Abused: No     Family History  Problem Relation Age of Onset   Lung cancer Father    Aneurysm Brother    Stroke Brother    Diabetes Maternal Grandmother    Kidney disease Maternal Grandfather    Cushing syndrome Paternal Grandmother    Dementia Paternal Grandfather    Breast cancer Maternal Aunt 25     Current Outpatient Medications:    acetaminophen (TYLENOL) 500 MG tablet, Take 1-2 tablets (500-1,000 mg total) by mouth every 6 (six) hours as needed., Disp: 60 tablet, Rfl: 0   albuterol (VENTOLIN HFA) 108 (90 Base) MCG/ACT inhaler, TAKE 2 PUFFS BY MOUTH EVERY 6 HOURS AS NEEDED FOR WHEEZE OR SHORTNESS OF BREATH, Disp: 8.5 each, Rfl: 2   atorvastatin (LIPITOR) 80 MG tablet, TAKE ONE TABLET BY MOUTH EVERY DAY, Disp: 90 tablet, Rfl: 3   buPROPion (WELLBUTRIN XL) 300 MG 24 hr tablet, TAKE 1 TABLET BY MOUTH DAILY, Disp: 90 tablet, Rfl: 1   clopidogrel (PLAVIX) 75 MG tablet, TAKE ONE TABLET BY MOUTH EVERY DAY, Disp: 90 tablet, Rfl: 3   docusate sodium (COLACE) 100 MG capsule, Take 100 mg by mouth 2 (two) times daily., Disp: , Rfl:    DULoxetine (CYMBALTA) 30 MG capsule, TAKE 1 CAPSULE BY MOUTH ONCE DAILY, Disp: 90 capsule, Rfl: 1   famotidine (PEPCID) 20 MG tablet, TAKE 1 TABLET BY MOUTH AT BEDTIME, Disp: 90 tablet, Rfl: 3   feeding supplement (ENSURE ENLIVE / ENSURE PLUS) LIQD, Take 237 mLs by mouth 3 (three) times daily between meals., Disp: 14220 mL, Rfl: 0   fluticasone (FLONASE) 50 MCG/ACT nasal spray, SPRAY 2 SPRAYS INTO EACH NOSTRIL  EVERY DAY, Disp: 48 mL, Rfl: 1   guaiFENesin (MUCINEX) 600 MG 12 hr tablet, Take 2 tablets (1,200 mg total) by mouth 2 (two) times daily as needed. For back pain, Disp: 28 tablet, Rfl: 3   ipratropium-albuterol (DUONEB) 0.5-2.5 (3) MG/3ML SOLN, Take 3 mLs by nebulization every 6 (six) hours as needed., Disp: 360 mL, Rfl: 11   pregabalin (LYRICA) 50 MG capsule, TAKE 1 CAPSULE (50 MG) BY MOUTH EVERY DAY AND TAKE 2 CAPSULES (100 MG) AT BEDTIME. Max daily dose 171m/day., Disp: 90 capsule, Rfl: 2   ferrous sulfate 325 (65 FE) MG tablet, Take 1 tablet (325 mg total) by mouth daily., Disp: 30 tablet, Rfl: 0   Physical exam:  There were no vitals filed for this visit.  Physical Exam Neurological:     Mental Status: She is oriented to person, place, and time.       CMP Latest Ref Rng & Units 12/09/2021  Glucose 70 - 99 mg/dL 132(H)  BUN 8 - 23 mg/dL 19  Creatinine 0.44 - 1.00 mg/dL 1.12(H)  Sodium 135 - 145 mmol/L 133(L)  Potassium 3.5 - 5.1 mmol/L 3.6  Chloride 98 - 111 mmol/L 102  CO2 22 - 32 mmol/L 23  Calcium 8.9 - 10.3 mg/dL 8.5(L)  Total Protein 6.5 - 8.1 g/dL 7.6  Total Bilirubin 0.3 - 1.2 mg/dL 0.4  Alkaline Phos 38 - 126 U/L 91  AST 15 - 41 U/L 20  ALT 0 - 44 U/L 13   CBC Latest Ref Rng & Units 12/09/2021  WBC 4.0 - 10.5 K/uL 9.4  Hemoglobin 12.0 - 15.0 g/dL 9.9(L)  Hematocrit 36.0 - 46.0 % 31.8(L)  Platelets 150 - 400 K/uL 258    No images are attached to the encounter.  No results found.  Assessment and plan- Patient is a 81 y.o. female who is here for f/u for normocytic anemia.  Previously evaluated by Dr. Janese Banks back in January.  Here to discuss lab results  Anemia work-up showed improvement of her hemoglobin to 9.9 (9.1), normal folate , ferritin, iron panel and CMP, TSH and kappa lamda light chains. Low normal B12 level. Multiple myeloma showed slightly elevated IgA at 496 and a polyclonal increase detected in 1 or more immunoglobulins.  Continue to monitor at this time.   No indication for a bone marrow biopsy.  B12 deficiency-discussed with patient's daughter started on oral B12 versus B12 injections.  Daughter feels it would be easier to begin oral B12. New rx sent for oral b12 supplements.   Fall-patient currently living at nursing facility.  Yesterday she fell over her walker onto her left knee.  Per patient she did require evaluation.   Disposition-follow-up in 6 months with lab work (CBC, CMP, B12) and evaluation with Dr. Janese Banks.  I provided 15 minutes of non face-to-face telephone visit time during this encounter, and > 50% was spent counseling as documented under my assessment & plan.  Thank you for this kind referral and the opportunity to participate in the care of this patient   Visit Diagnosis No diagnosis found.  Faythe Casa, NP 01/25/2022 3:19 PM

## 2022-01-26 ENCOUNTER — Other Ambulatory Visit: Payer: Self-pay | Admitting: Family

## 2022-01-26 ENCOUNTER — Encounter: Payer: Self-pay | Admitting: Oncology

## 2022-01-26 ENCOUNTER — Other Ambulatory Visit: Payer: Self-pay | Admitting: *Deleted

## 2022-01-26 DIAGNOSIS — J9801 Acute bronchospasm: Secondary | ICD-10-CM

## 2022-01-26 DIAGNOSIS — R278 Other lack of coordination: Secondary | ICD-10-CM | POA: Diagnosis not present

## 2022-01-26 DIAGNOSIS — I639 Cerebral infarction, unspecified: Secondary | ICD-10-CM | POA: Diagnosis not present

## 2022-01-26 DIAGNOSIS — K219 Gastro-esophageal reflux disease without esophagitis: Secondary | ICD-10-CM | POA: Diagnosis not present

## 2022-01-26 DIAGNOSIS — E782 Mixed hyperlipidemia: Secondary | ICD-10-CM | POA: Diagnosis not present

## 2022-01-26 DIAGNOSIS — M6281 Muscle weakness (generalized): Secondary | ICD-10-CM | POA: Diagnosis not present

## 2022-01-26 DIAGNOSIS — G3184 Mild cognitive impairment, so stated: Secondary | ICD-10-CM | POA: Diagnosis not present

## 2022-01-26 DIAGNOSIS — J8410 Pulmonary fibrosis, unspecified: Secondary | ICD-10-CM

## 2022-01-26 DIAGNOSIS — Z79899 Other long term (current) drug therapy: Secondary | ICD-10-CM | POA: Diagnosis not present

## 2022-01-26 DIAGNOSIS — J84178 Other interstitial pulmonary diseases with fibrosis in diseases classified elsewhere: Secondary | ICD-10-CM | POA: Diagnosis not present

## 2022-01-26 DIAGNOSIS — M5459 Other low back pain: Secondary | ICD-10-CM | POA: Diagnosis not present

## 2022-01-26 DIAGNOSIS — I6931 Attention and concentration deficit following cerebral infarction: Secondary | ICD-10-CM | POA: Diagnosis not present

## 2022-01-26 NOTE — Telephone Encounter (Signed)
Please look into this

## 2022-01-26 NOTE — Telephone Encounter (Signed)
I called and spoke to [employer]. She had said that he vitamin b[facility] and [facility] called to cancel it because it was the wrong pharmacy. She states that pt is [facility] facility now and they use [facility] and she did not know where it is located so she gave me a fax number to send the rx to them and they can request it from the [facility]. The fax number was [phone]. I sent it in 1000 mcg daily . The fax transmission did go through

## 2022-01-28 DIAGNOSIS — R279 Unspecified lack of coordination: Secondary | ICD-10-CM | POA: Diagnosis not present

## 2022-01-28 DIAGNOSIS — R2689 Other abnormalities of gait and mobility: Secondary | ICD-10-CM | POA: Diagnosis not present

## 2022-01-28 DIAGNOSIS — M6281 Muscle weakness (generalized): Secondary | ICD-10-CM | POA: Diagnosis not present

## 2022-01-28 DIAGNOSIS — R278 Other lack of coordination: Secondary | ICD-10-CM | POA: Diagnosis not present

## 2022-01-28 DIAGNOSIS — I639 Cerebral infarction, unspecified: Secondary | ICD-10-CM | POA: Diagnosis not present

## 2022-01-28 DIAGNOSIS — R296 Repeated falls: Secondary | ICD-10-CM | POA: Diagnosis not present

## 2022-02-01 DIAGNOSIS — R279 Unspecified lack of coordination: Secondary | ICD-10-CM | POA: Diagnosis not present

## 2022-02-01 DIAGNOSIS — M6281 Muscle weakness (generalized): Secondary | ICD-10-CM | POA: Diagnosis not present

## 2022-02-01 DIAGNOSIS — I639 Cerebral infarction, unspecified: Secondary | ICD-10-CM | POA: Diagnosis not present

## 2022-02-01 DIAGNOSIS — R296 Repeated falls: Secondary | ICD-10-CM | POA: Diagnosis not present

## 2022-02-01 DIAGNOSIS — R2689 Other abnormalities of gait and mobility: Secondary | ICD-10-CM | POA: Diagnosis not present

## 2022-02-01 DIAGNOSIS — R278 Other lack of coordination: Secondary | ICD-10-CM | POA: Diagnosis not present

## 2022-02-02 DIAGNOSIS — R296 Repeated falls: Secondary | ICD-10-CM | POA: Diagnosis not present

## 2022-02-02 DIAGNOSIS — M6281 Muscle weakness (generalized): Secondary | ICD-10-CM | POA: Diagnosis not present

## 2022-02-02 DIAGNOSIS — R2689 Other abnormalities of gait and mobility: Secondary | ICD-10-CM | POA: Diagnosis not present

## 2022-02-02 DIAGNOSIS — R279 Unspecified lack of coordination: Secondary | ICD-10-CM | POA: Diagnosis not present

## 2022-02-02 DIAGNOSIS — I639 Cerebral infarction, unspecified: Secondary | ICD-10-CM | POA: Diagnosis not present

## 2022-02-02 DIAGNOSIS — R278 Other lack of coordination: Secondary | ICD-10-CM | POA: Diagnosis not present

## 2022-02-03 DIAGNOSIS — R279 Unspecified lack of coordination: Secondary | ICD-10-CM | POA: Diagnosis not present

## 2022-02-03 DIAGNOSIS — R2689 Other abnormalities of gait and mobility: Secondary | ICD-10-CM | POA: Diagnosis not present

## 2022-02-03 DIAGNOSIS — I639 Cerebral infarction, unspecified: Secondary | ICD-10-CM | POA: Diagnosis not present

## 2022-02-03 DIAGNOSIS — R296 Repeated falls: Secondary | ICD-10-CM | POA: Diagnosis not present

## 2022-02-03 DIAGNOSIS — M6281 Muscle weakness (generalized): Secondary | ICD-10-CM | POA: Diagnosis not present

## 2022-02-04 DIAGNOSIS — B351 Tinea unguium: Secondary | ICD-10-CM | POA: Diagnosis not present

## 2022-02-04 DIAGNOSIS — I739 Peripheral vascular disease, unspecified: Secondary | ICD-10-CM | POA: Diagnosis not present

## 2022-02-05 DIAGNOSIS — M6281 Muscle weakness (generalized): Secondary | ICD-10-CM | POA: Diagnosis not present

## 2022-02-05 DIAGNOSIS — R296 Repeated falls: Secondary | ICD-10-CM | POA: Diagnosis not present

## 2022-02-05 DIAGNOSIS — R278 Other lack of coordination: Secondary | ICD-10-CM | POA: Diagnosis not present

## 2022-02-05 DIAGNOSIS — I639 Cerebral infarction, unspecified: Secondary | ICD-10-CM | POA: Diagnosis not present

## 2022-02-08 DIAGNOSIS — R296 Repeated falls: Secondary | ICD-10-CM | POA: Diagnosis not present

## 2022-02-08 DIAGNOSIS — R2689 Other abnormalities of gait and mobility: Secondary | ICD-10-CM | POA: Diagnosis not present

## 2022-02-08 DIAGNOSIS — R279 Unspecified lack of coordination: Secondary | ICD-10-CM | POA: Diagnosis not present

## 2022-02-08 DIAGNOSIS — M6281 Muscle weakness (generalized): Secondary | ICD-10-CM | POA: Diagnosis not present

## 2022-02-08 DIAGNOSIS — I639 Cerebral infarction, unspecified: Secondary | ICD-10-CM | POA: Diagnosis not present

## 2022-02-08 DIAGNOSIS — M70821 Other soft tissue disorders related to use, overuse and pressure, right upper arm: Secondary | ICD-10-CM | POA: Diagnosis not present

## 2022-02-08 DIAGNOSIS — Z993 Dependence on wheelchair: Secondary | ICD-10-CM | POA: Diagnosis not present

## 2022-02-08 DIAGNOSIS — J449 Chronic obstructive pulmonary disease, unspecified: Secondary | ICD-10-CM | POA: Diagnosis not present

## 2022-02-09 ENCOUNTER — Other Ambulatory Visit: Payer: Self-pay | Admitting: Family

## 2022-02-09 DIAGNOSIS — R278 Other lack of coordination: Secondary | ICD-10-CM | POA: Diagnosis not present

## 2022-02-09 DIAGNOSIS — R279 Unspecified lack of coordination: Secondary | ICD-10-CM | POA: Diagnosis not present

## 2022-02-09 DIAGNOSIS — R296 Repeated falls: Secondary | ICD-10-CM | POA: Diagnosis not present

## 2022-02-09 DIAGNOSIS — M6281 Muscle weakness (generalized): Secondary | ICD-10-CM | POA: Diagnosis not present

## 2022-02-09 DIAGNOSIS — R2689 Other abnormalities of gait and mobility: Secondary | ICD-10-CM | POA: Diagnosis not present

## 2022-02-09 DIAGNOSIS — I639 Cerebral infarction, unspecified: Secondary | ICD-10-CM | POA: Diagnosis not present

## 2022-02-10 DIAGNOSIS — I639 Cerebral infarction, unspecified: Secondary | ICD-10-CM | POA: Diagnosis not present

## 2022-02-10 DIAGNOSIS — J8410 Pulmonary fibrosis, unspecified: Secondary | ICD-10-CM | POA: Diagnosis not present

## 2022-02-10 DIAGNOSIS — R278 Other lack of coordination: Secondary | ICD-10-CM | POA: Diagnosis not present

## 2022-02-10 DIAGNOSIS — M6281 Muscle weakness (generalized): Secondary | ICD-10-CM | POA: Diagnosis not present

## 2022-02-11 ENCOUNTER — Ambulatory Visit: Payer: Medicare Other | Admitting: Pulmonary Disease

## 2022-02-11 ENCOUNTER — Encounter: Payer: Self-pay | Admitting: Pulmonary Disease

## 2022-02-11 ENCOUNTER — Other Ambulatory Visit: Payer: Self-pay

## 2022-02-11 VITALS — BP 140/80 | HR 75 | Temp 97.7°F | Ht 64.0 in | Wt 162.0 lb

## 2022-02-11 DIAGNOSIS — R911 Solitary pulmonary nodule: Secondary | ICD-10-CM | POA: Diagnosis not present

## 2022-02-11 DIAGNOSIS — J9801 Acute bronchospasm: Secondary | ICD-10-CM | POA: Diagnosis not present

## 2022-02-11 DIAGNOSIS — J8410 Pulmonary fibrosis, unspecified: Secondary | ICD-10-CM | POA: Diagnosis not present

## 2022-02-11 DIAGNOSIS — M6281 Muscle weakness (generalized): Secondary | ICD-10-CM | POA: Diagnosis not present

## 2022-02-11 DIAGNOSIS — J9611 Chronic respiratory failure with hypoxia: Secondary | ICD-10-CM | POA: Diagnosis not present

## 2022-02-11 DIAGNOSIS — R278 Other lack of coordination: Secondary | ICD-10-CM | POA: Diagnosis not present

## 2022-02-11 DIAGNOSIS — I639 Cerebral infarction, unspecified: Secondary | ICD-10-CM | POA: Diagnosis not present

## 2022-02-11 NOTE — Patient Instructions (Addendum)
I recommend that you use your DuoNeb (nebulizer) at least twice a day. ? ?Use your albuterol inhaler as needed for shortness of breath. ? ?We have sent an update on your oxygen prescription to [facility] you will be at 3 L/min at rest and with sleep and 4 L/min when you walk or participate in physical therapy. ? ?We will see you in follow-up in 3 months time call sooner should any new problems arise. ? ? ? ? ? ?

## 2022-02-11 NOTE — Progress Notes (Signed)
Subjective:    Patient ID: Melody Ochoa, female    DOB: 1941/07/09, 81 y.o.   MRN: [id number] Patient Care Team: Burnard Hawthorne, FNP as PCP - General (Family Medicine) Deboraha Sprang, MD as PCP - Electrophysiology (Cardiology)  Chief Complaint  Patient presents with   Follow-up    HPI Patient is an 81 year old with end-stage pulmonary fibrosis and chronic respiratory failure with hypoxia who presents for follow-up of these issues.  She was last seen in December 2022 during a visit for evaluation of pulmonary nodule.  Patient has not a candidate for invasive procedures.  She has been compliant with oxygen at 3 L/min but notices increased dyspnea on exertion even on 3 L/min.  She has declined any further interventions with regards to her pulmonary fibrosis.  This is quite severe and end-stage.  She has nebulizer therapy at home however does not use it consistently.  She does note quite a bit of wheezing during the day.  She has dyspnea even at rest.  She has a dry cough.  No hemoptysis.  She does admit that when she uses her nebulizers her breathing is somewhat better.  Wheezing also resolves.   Review of Systems A 10 point review of systems was performed and it is as noted above otherwise negative.  Patient Active Problem List   Diagnosis Date Noted   PND (post-nasal drip) 01/22/2022   Nasal congestion 11/12/2021   Diarrhea 11/12/2021   Upper respiratory tract infection 11/12/2021   Pain in joint of right elbow 11/09/2021   Weakness    Confusion    Hypotension    Acute blood loss anemia    Irritation of left eye    Acute respiratory failure with hypoxia (HCC)    Interstitial lung disease (HCC)    Gastroesophageal reflux disease without esophagitis    Leg swelling 07/13/2021   Intercostal neuralgia 04/16/2021   Multiple closed fractures of ribs of left side 04/16/2021   Adenoma of left adrenal gland 12/25/2020   Ischemic stroke ([place]) 12/10/2020   Chronic kidney  disease, stage 3a ([place]) 12/09/2020   Chronic respiratory failure with hypoxia ([place]) 12/09/2020   Fall at home, initial encounter 12/09/2020   Right shoulder pain 12/09/2020   Memory deficit 09/24/2020   Pulmonary fibrosis ([place]) 09/14/2020   Chronic cough 09/14/2020   Arthritis of left sacroiliac joint 08/26/2020   Chronic left SI joint pain 08/26/2020   CKD (chronic kidney disease) 07/23/2020   Impingement syndrome of left shoulder region 05/16/2020   HTN (hypertension) 04/23/2020   Positive fecal immunochemical test    Angiodysplasia of intestinal tract    Transient ischemic attack (TIA) 10/08/2019   Transient right leg weakness 09/30/2019   Peripheral neuropathy 08/10/2019   Primary osteoarthritis of left shoulder 07/24/2019   Elevated blood pressure reading 06/27/2019   Left shoulder pain 05/04/2019   Adjacent segment disease with spinal stenosis 04/13/2019   Anemia 03/19/2019   Long term current use of opiate analgesic 01/09/2019   Foraminal stenosis of lumbar region 11/14/2018   Chronic pain syndrome 09/12/2018   Shortness of breath 08/11/2018   Dysphagia 08/11/2018   Cough 06/30/2018   Rash 01/16/2018   Hyperlipidemia 10/18/2017   History of lumbar surgery 10/04/2017   Depression, recurrent ([place]) 09/27/2017   Lung nodule 09/27/2017   History of CVA (cerebrovascular accident) 09/12/2017   Cerebellar stroke, acute ([place]) 08/10/2017   Primary osteoarthritis of left hip 02/28/2017   Strain of left hip 02/28/2017  B12 deficiency 12/21/2016   Generalized OA 05/13/2016   Status post total hip replacement, left 04/22/2016   Personal history of colonic polyps    Greater trochanteric bursitis of both hips 01/09/2016   Prediabetes 08/04/2015   Thoracic aortic atherosclerosis ([place]) 06/16/2015   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   UTI (lower urinary tract infection) 11/22/2014   Low back pain 11/22/2014   Spondylolisthesis of lumbar region 11/18/2014   Lumbar  radiculopathy 08/21/2014   Degeneration of lumbar or lumbosacral intervertebral disc 08/21/2014   Acute hip pain, left 08/21/2014   Osteopenia 06/23/2014   Major depression in remission ([place]) 06/23/2014   Anxiety 06/23/2014   Menopause 06/23/2014   Social History   Tobacco Use   Smoking status: Former    Packs/day: 0.75    Years: 40.00    Pack years: 30.00    Types: Cigarettes    Quit date: 11/13/1996    Years since quitting: 25.2   Smokeless tobacco: Never  Substance Use Topics   Alcohol use: Yes    Comment: occ wine   Allergies  Allergen Reactions   Latex Itching   Aleve [Naproxen Sodium] Other (See Comments)    Gi upset   Aspirin Other (See Comments)    Stomach pain-aggravates diverticulosis   Celebrex [Celecoxib] Other (See Comments)    Dizziness    Effexor [Venlafaxine] Other (See Comments)    Hot flashes    Gabapentin Other (See Comments)    "Night terrors"   Hydrocodone Bit-Homatrop Mbr Diarrhea   Ibuprofen Other (See Comments)    Gi upset    Mobic [Meloxicam]     Stomach upset   Tape Itching and Other (See Comments)    Itchy blisters   Vioxx [Rofecoxib] Other (See Comments)    Gi upset   Other Rash and Other (See Comments)    bandaides   Tramadol Other (See Comments)    hallucinations   Current Meds  Medication Sig   ACETAMINOPHEN EXTRA STRENGTH 500 MG tablet TAKE 2 TABS ('1000MG'$ ) BY MOUTH EVERY 6 HOURS AS NEEDED FOR PAIN   albuterol (VENTOLIN HFA) 108 (90 Base) MCG/ACT inhaler INHALE 2 PUFFS BY MOUTH EVERY 6 HOURS AS NEEDED FOR SHORTNESS OF BREATH / WHEEZE   atorvastatin (LIPITOR) 80 MG tablet TAKE ONE TABLET BY MOUTH EVERY DAY   buPROPion (WELLBUTRIN XL) 300 MG 24 hr tablet TAKE 1 TABLET BY MOUTH DAILY   clopidogrel (PLAVIX) 75 MG tablet TAKE ONE TABLET BY MOUTH EVERY DAY   docusate sodium (COLACE) 100 MG capsule Take 100 mg by mouth 2 (two) times daily.   DULoxetine (CYMBALTA) 30 MG capsule TAKE 1 CAPSULE BY MOUTH ONCE DAILY   famotidine (PEPCID)  20 MG tablet TAKE 1 TAB BY MOUTH AT BEDTIME   feeding supplement (ENSURE ENLIVE / ENSURE PLUS) LIQD Take 237 mLs by mouth 3 (three) times daily between meals.   fluticasone (FLONASE) 50 MCG/ACT nasal spray SPRAY 2 SPRAYS INTO EACH NOSTRIL EVERY DAY   guaiFENesin (MUCINEX) 600 MG 12 hr tablet Take 2 tablets (1,200 mg total) by mouth 2 (two) times daily as needed. For back pain   ipratropium-albuterol (DUONEB) 0.5-2.5 (3) MG/3ML SOLN Take 3 mLs by nebulization every 6 (six) hours as needed.   pregabalin (LYRICA) 50 MG capsule TAKE 1 CAPSULE (50 MG) BY MOUTH EVERY DAY AND TAKE 2 CAPSULES (100 MG) AT BEDTIME. Max daily dose '150mg'$ /day.   Immunization History  Administered Date(s) Administered   Fluad Quad(high Dose 65+) 09/21/2021  Influenza, High Dose Seasonal PF 01/16/2018, 10/05/2018   Influenza-Unspecified 09/28/2013, 10/09/2014, 09/08/2016, 09/27/2016, 09/12/2017, 01/16/2018   PFIZER(Purple Top)SARS-COV-2 Vaccination 01/31/2020, 02/21/2020   PPD Test 12/08/2021   Pneumococcal Conjugate-13 10/09/2014   Pneumococcal Polysaccharide-23 02/14/2018, 10/01/2019   Td 07/10/2014   Tdap 07/24/2017       Objective:   Physical Exam BP 140/80 (BP Location: Left Arm, Patient Position: Sitting, Cuff Size: Normal)   Pulse 75   Temp 97.7 F (36.5 C) (Oral)   Ht '5\' 4"'$  (1.626 m)   Wt 162 lb (73.5 kg)   SpO2 95%   BMI 27.81 kg/m    GENERAL: Well-developed somewhat overweight elderly woman in no acute distress. She presents on transport chair due to disabling dyspnea with ambulation. HEAD: Normocephalic, atraumatic.  EYES: Pupils equal, round, reactive to light.  No scleral icterus.  MOUTH: Nose/mouth/throat not examined due to masking requirements for COVID 19. NECK: Supple. No thyromegaly. Trachea midline. No JVD.  No adenopathy. PULMONARY: Good air entry bilaterally. She has wheezes and crackles noted throughout. These are dry crackles. CARDIOVASCULAR: S1 and S2. Regular rate and rhythm. No  rubs, murmurs or gallops heard. ABDOMEN: Benign. MUSCULOSKELETAL: No joint deformity, no clubbing, no edema.  NEUROLOGIC: No focal deficit, gait has to be assisted, fluent speech. SKIN: Intact,warm,dry. PSYCH: Mood and behavior normal.      Assessment & Plan:     ICD-10-CM   1. Pulmonary fibrosis ([place])  J84.10    End-stage Recommend palliative care    2. Chronic respiratory failure with hypoxia (HCC)  J96.11    Oxygen at 3 L/min with rest/sleep and 4 L/min with ambulation     3. Bronchospasm  J98.01    Use DuoNeb at least twice a day Continue using Ventolin as needed    4. Lung nodule- subpleural on R 10 mm  R91.1    Patient not a candidate for diagnostic procedures Patient is end-stage     Orders Placed This Encounter  Procedures   AMB REFERRAL FOR DME    Referral Priority:   Routine    Referral Type:   Durable Medical Equipment Purchase    Number of Visits Requested:   1   At this point management of her issues is mostly palliative.  Recommend continuing oxygen at 3 L/min via nasal cannula during rest and sleep and 4 L/min with exertion.  She was encouraged to use her DuoNebs at least twice a day.  This will at least help with mucociliary clearance.  Will see her in follow-up in 3 months time she is to contact us prior to that time should any new difficulties arise.   Renold Don, MD Advanced Bronchoscopy PCCM [facility]    *This note was dictated using voice recognition software/Dragon.  Despite best efforts to proofread, errors can occur which can change the meaning. Any transcriptional errors that result from this process are unintentional and may not be fully corrected at the time of dictation.

## 2022-02-12 ENCOUNTER — Telehealth: Payer: Self-pay | Admitting: *Deleted

## 2022-02-12 DIAGNOSIS — R2689 Other abnormalities of gait and mobility: Secondary | ICD-10-CM | POA: Diagnosis not present

## 2022-02-12 DIAGNOSIS — I639 Cerebral infarction, unspecified: Secondary | ICD-10-CM | POA: Diagnosis not present

## 2022-02-12 DIAGNOSIS — R279 Unspecified lack of coordination: Secondary | ICD-10-CM | POA: Diagnosis not present

## 2022-02-12 DIAGNOSIS — R296 Repeated falls: Secondary | ICD-10-CM | POA: Diagnosis not present

## 2022-02-12 DIAGNOSIS — M6281 Muscle weakness (generalized): Secondary | ICD-10-CM | POA: Diagnosis not present

## 2022-02-12 NOTE — Telephone Encounter (Signed)
4:15p ?Received a communication that patient's friend-[name] called wanting to schedule a Palliative care visit. Patient is now at [facility]. I left a message with Rod Holler stating that our NP, Damaris Hippo can visit with patient on 02/16/22 @ 10a. I requested that she call us back to confirm she is agreeable to this date/time. Left contact information for return call.  ?

## 2022-02-15 DIAGNOSIS — R296 Repeated falls: Secondary | ICD-10-CM | POA: Diagnosis not present

## 2022-02-15 DIAGNOSIS — I639 Cerebral infarction, unspecified: Secondary | ICD-10-CM | POA: Diagnosis not present

## 2022-02-15 DIAGNOSIS — M6281 Muscle weakness (generalized): Secondary | ICD-10-CM | POA: Diagnosis not present

## 2022-02-15 DIAGNOSIS — R278 Other lack of coordination: Secondary | ICD-10-CM | POA: Diagnosis not present

## 2022-02-16 ENCOUNTER — Non-Acute Institutional Stay: Payer: Medicare Other | Admitting: Family Medicine

## 2022-02-16 ENCOUNTER — Encounter: Payer: Self-pay | Admitting: Family Medicine

## 2022-02-16 ENCOUNTER — Other Ambulatory Visit: Payer: Self-pay

## 2022-02-16 ENCOUNTER — Other Ambulatory Visit: Payer: Self-pay | Admitting: Family

## 2022-02-16 VITALS — BP 104/62 | HR 76 | Temp 97.7°F | Resp 16

## 2022-02-16 DIAGNOSIS — R2689 Other abnormalities of gait and mobility: Secondary | ICD-10-CM | POA: Diagnosis not present

## 2022-02-16 DIAGNOSIS — R296 Repeated falls: Secondary | ICD-10-CM | POA: Diagnosis not present

## 2022-02-16 DIAGNOSIS — J9611 Chronic respiratory failure with hypoxia: Secondary | ICD-10-CM

## 2022-02-16 DIAGNOSIS — Z515 Encounter for palliative care: Secondary | ICD-10-CM | POA: Insufficient documentation

## 2022-02-16 DIAGNOSIS — R279 Unspecified lack of coordination: Secondary | ICD-10-CM | POA: Diagnosis not present

## 2022-02-16 DIAGNOSIS — R278 Other lack of coordination: Secondary | ICD-10-CM | POA: Diagnosis not present

## 2022-02-16 DIAGNOSIS — M6281 Muscle weakness (generalized): Secondary | ICD-10-CM | POA: Diagnosis not present

## 2022-02-16 DIAGNOSIS — I639 Cerebral infarction, unspecified: Secondary | ICD-10-CM | POA: Diagnosis not present

## 2022-02-16 DIAGNOSIS — R413 Other amnesia: Secondary | ICD-10-CM

## 2022-02-16 DIAGNOSIS — J8410 Pulmonary fibrosis, unspecified: Secondary | ICD-10-CM

## 2022-02-16 DIAGNOSIS — R5381 Other malaise: Secondary | ICD-10-CM
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
## 2022-02-17 ENCOUNTER — Inpatient Hospital Stay: Payer: Medicare Other

## 2022-02-17 DIAGNOSIS — I639 Cerebral infarction, unspecified: Secondary | ICD-10-CM | POA: Diagnosis not present

## 2022-02-17 DIAGNOSIS — M6281 Muscle weakness (generalized): Secondary | ICD-10-CM | POA: Diagnosis not present

## 2022-02-17 DIAGNOSIS — R278 Other lack of coordination: Secondary | ICD-10-CM | POA: Diagnosis not present

## 2022-02-18 DIAGNOSIS — I639 Cerebral infarction, unspecified: Secondary | ICD-10-CM | POA: Diagnosis not present

## 2022-02-18 DIAGNOSIS — R296 Repeated falls: Secondary | ICD-10-CM | POA: Diagnosis not present

## 2022-02-18 DIAGNOSIS — R2689 Other abnormalities of gait and mobility: Secondary | ICD-10-CM | POA: Diagnosis not present

## 2022-02-18 DIAGNOSIS — M6281 Muscle weakness (generalized): Secondary | ICD-10-CM | POA: Diagnosis not present

## 2022-02-18 DIAGNOSIS — R279 Unspecified lack of coordination: Secondary | ICD-10-CM | POA: Diagnosis not present

## 2022-02-19 DIAGNOSIS — R278 Other lack of coordination: Secondary | ICD-10-CM | POA: Diagnosis not present

## 2022-02-19 DIAGNOSIS — M6281 Muscle weakness (generalized): Secondary | ICD-10-CM | POA: Diagnosis not present

## 2022-02-19 DIAGNOSIS — R296 Repeated falls: Secondary | ICD-10-CM | POA: Diagnosis not present

## 2022-02-19 DIAGNOSIS — I639 Cerebral infarction, unspecified: Secondary | ICD-10-CM | POA: Diagnosis not present

## 2022-02-23 DIAGNOSIS — K219 Gastro-esophageal reflux disease without esophagitis: Secondary | ICD-10-CM | POA: Diagnosis not present

## 2022-02-23 DIAGNOSIS — F32A Depression, unspecified: Secondary | ICD-10-CM | POA: Diagnosis not present

## 2022-02-23 DIAGNOSIS — J449 Chronic obstructive pulmonary disease, unspecified: Secondary | ICD-10-CM | POA: Diagnosis not present

## 2022-02-23 DIAGNOSIS — G309 Alzheimer's disease, unspecified: Secondary | ICD-10-CM | POA: Diagnosis not present

## 2022-02-23 DIAGNOSIS — R279 Unspecified lack of coordination: Secondary | ICD-10-CM | POA: Diagnosis not present

## 2022-02-23 DIAGNOSIS — M6281 Muscle weakness (generalized): Secondary | ICD-10-CM | POA: Diagnosis not present

## 2022-02-23 DIAGNOSIS — I639 Cerebral infarction, unspecified: Secondary | ICD-10-CM | POA: Diagnosis not present

## 2022-02-23 DIAGNOSIS — R2689 Other abnormalities of gait and mobility: Secondary | ICD-10-CM | POA: Diagnosis not present

## 2022-02-23 DIAGNOSIS — Z9981 Dependence on supplemental oxygen: Secondary | ICD-10-CM | POA: Diagnosis not present

## 2022-02-23 DIAGNOSIS — Z8673 Personal history of transient ischemic attack (TIA), and cerebral infarction without residual deficits: Secondary | ICD-10-CM | POA: Diagnosis not present

## 2022-02-23 DIAGNOSIS — R296 Repeated falls: Secondary | ICD-10-CM | POA: Diagnosis not present

## 2022-02-24 ENCOUNTER — Telehealth: Payer: Self-pay | Admitting: Family

## 2022-02-24 DIAGNOSIS — I639 Cerebral infarction, unspecified: Secondary | ICD-10-CM | POA: Diagnosis not present

## 2022-02-24 DIAGNOSIS — R278 Other lack of coordination: Secondary | ICD-10-CM | POA: Diagnosis not present

## 2022-02-24 DIAGNOSIS — R296 Repeated falls: Secondary | ICD-10-CM | POA: Diagnosis not present

## 2022-02-24 DIAGNOSIS — M6281 Muscle weakness (generalized): Secondary | ICD-10-CM | POA: Diagnosis not present

## 2022-02-24 DIAGNOSIS — R279 Unspecified lack of coordination: Secondary | ICD-10-CM | POA: Diagnosis not present

## 2022-02-24 DIAGNOSIS — R2689 Other abnormalities of gait and mobility: Secondary | ICD-10-CM | POA: Diagnosis not present

## 2022-02-24 NOTE — Telephone Encounter (Signed)
Copied from [place] [id number]. Topic: Medicare AWV ?>> Feb 24, 2022 10:09 AM Harris-Coley, Hannah Beat wrote: ?Reason for CRM: Left message for patient to schedule Annual Wellness Visit.  Please schedule with Nurse Health Advisor Denisa O'Brien-Blaney, LPN at [facility].  Please call [phone] ask for Juliann Pulse ?

## 2022-02-25 DIAGNOSIS — R2689 Other abnormalities of gait and mobility: Secondary | ICD-10-CM | POA: Diagnosis not present

## 2022-02-25 DIAGNOSIS — I639 Cerebral infarction, unspecified: Secondary | ICD-10-CM | POA: Diagnosis not present

## 2022-02-25 DIAGNOSIS — R296 Repeated falls: Secondary | ICD-10-CM | POA: Diagnosis not present

## 2022-02-25 DIAGNOSIS — M6281 Muscle weakness (generalized): Secondary | ICD-10-CM | POA: Diagnosis not present

## 2022-02-25 DIAGNOSIS — R279 Unspecified lack of coordination: Secondary | ICD-10-CM | POA: Diagnosis not present

## 2022-02-25 DIAGNOSIS — R278 Other lack of coordination: Secondary | ICD-10-CM | POA: Diagnosis not present

## 2022-02-26 DIAGNOSIS — M6281 Muscle weakness (generalized): Secondary | ICD-10-CM | POA: Diagnosis not present

## 2022-02-26 DIAGNOSIS — R296 Repeated falls: Secondary | ICD-10-CM | POA: Diagnosis not present

## 2022-02-26 DIAGNOSIS — I639 Cerebral infarction, unspecified: Secondary | ICD-10-CM | POA: Diagnosis not present

## 2022-02-26 DIAGNOSIS — R2689 Other abnormalities of gait and mobility: Secondary | ICD-10-CM | POA: Diagnosis not present

## 2022-02-26 DIAGNOSIS — R278 Other lack of coordination: Secondary | ICD-10-CM | POA: Diagnosis not present

## 2022-02-26 DIAGNOSIS — R279 Unspecified lack of coordination: Secondary | ICD-10-CM | POA: Diagnosis not present

## 2022-03-01 DIAGNOSIS — M6281 Muscle weakness (generalized): Secondary | ICD-10-CM | POA: Diagnosis not present

## 2022-03-01 DIAGNOSIS — I639 Cerebral infarction, unspecified: Secondary | ICD-10-CM | POA: Diagnosis not present

## 2022-03-01 DIAGNOSIS — R278 Other lack of coordination: Secondary | ICD-10-CM | POA: Diagnosis not present

## 2022-03-02 DIAGNOSIS — R279 Unspecified lack of coordination: Secondary | ICD-10-CM | POA: Diagnosis not present

## 2022-03-02 DIAGNOSIS — R2689 Other abnormalities of gait and mobility: Secondary | ICD-10-CM | POA: Diagnosis not present

## 2022-03-02 DIAGNOSIS — R296 Repeated falls: Secondary | ICD-10-CM | POA: Diagnosis not present

## 2022-03-02 DIAGNOSIS — I639 Cerebral infarction, unspecified: Secondary | ICD-10-CM | POA: Diagnosis not present

## 2022-03-02 DIAGNOSIS — M6281 Muscle weakness (generalized): Secondary | ICD-10-CM | POA: Diagnosis not present

## 2022-03-04 DIAGNOSIS — R278 Other lack of coordination: Secondary | ICD-10-CM | POA: Diagnosis not present

## 2022-03-04 DIAGNOSIS — R2689 Other abnormalities of gait and mobility: Secondary | ICD-10-CM | POA: Diagnosis not present

## 2022-03-04 DIAGNOSIS — R279 Unspecified lack of coordination: Secondary | ICD-10-CM | POA: Diagnosis not present

## 2022-03-04 DIAGNOSIS — M6281 Muscle weakness (generalized): Secondary | ICD-10-CM | POA: Diagnosis not present

## 2022-03-04 DIAGNOSIS — R296 Repeated falls: Secondary | ICD-10-CM | POA: Diagnosis not present

## 2022-03-04 DIAGNOSIS — I639 Cerebral infarction, unspecified: Secondary | ICD-10-CM | POA: Diagnosis not present

## 2022-03-05 DIAGNOSIS — R2689 Other abnormalities of gait and mobility: Secondary | ICD-10-CM | POA: Diagnosis not present

## 2022-03-05 DIAGNOSIS — R296 Repeated falls: Secondary | ICD-10-CM | POA: Diagnosis not present

## 2022-03-05 DIAGNOSIS — M6281 Muscle weakness (generalized): Secondary | ICD-10-CM | POA: Diagnosis not present

## 2022-03-05 DIAGNOSIS — R278 Other lack of coordination: Secondary | ICD-10-CM | POA: Diagnosis not present

## 2022-03-05 DIAGNOSIS — I639 Cerebral infarction, unspecified: Secondary | ICD-10-CM | POA: Diagnosis not present

## 2022-03-05 DIAGNOSIS — R279 Unspecified lack of coordination: Secondary | ICD-10-CM | POA: Diagnosis not present

## 2022-03-08 DIAGNOSIS — I639 Cerebral infarction, unspecified: Secondary | ICD-10-CM | POA: Diagnosis not present

## 2022-03-08 DIAGNOSIS — M6281 Muscle weakness (generalized): Secondary | ICD-10-CM | POA: Diagnosis not present

## 2022-03-08 DIAGNOSIS — R296 Repeated falls: Secondary | ICD-10-CM | POA: Diagnosis not present

## 2022-03-08 DIAGNOSIS — R278 Other lack of coordination: Secondary | ICD-10-CM | POA: Diagnosis not present

## 2022-03-09 DIAGNOSIS — R278 Other lack of coordination: Secondary | ICD-10-CM | POA: Diagnosis not present

## 2022-03-09 DIAGNOSIS — I639 Cerebral infarction, unspecified: Secondary | ICD-10-CM | POA: Diagnosis not present

## 2022-03-09 DIAGNOSIS — R296 Repeated falls: Secondary | ICD-10-CM | POA: Diagnosis not present

## 2022-03-09 DIAGNOSIS — R279 Unspecified lack of coordination: Secondary | ICD-10-CM | POA: Diagnosis not present

## 2022-03-09 DIAGNOSIS — R2689 Other abnormalities of gait and mobility: Secondary | ICD-10-CM | POA: Diagnosis not present

## 2022-03-09 DIAGNOSIS — M6281 Muscle weakness (generalized): Secondary | ICD-10-CM | POA: Diagnosis not present

## 2022-03-11 DIAGNOSIS — M6281 Muscle weakness (generalized): Secondary | ICD-10-CM | POA: Diagnosis not present

## 2022-03-11 DIAGNOSIS — R279 Unspecified lack of coordination: Secondary | ICD-10-CM | POA: Diagnosis not present

## 2022-03-11 DIAGNOSIS — I639 Cerebral infarction, unspecified: Secondary | ICD-10-CM | POA: Diagnosis not present

## 2022-03-11 DIAGNOSIS — R278 Other lack of coordination: Secondary | ICD-10-CM | POA: Diagnosis not present

## 2022-03-11 DIAGNOSIS — R296 Repeated falls: Secondary | ICD-10-CM | POA: Diagnosis not present

## 2022-03-11 DIAGNOSIS — R2689 Other abnormalities of gait and mobility: Secondary | ICD-10-CM | POA: Diagnosis not present

## 2022-03-12 DIAGNOSIS — R296 Repeated falls: Secondary | ICD-10-CM | POA: Diagnosis not present

## 2022-03-12 DIAGNOSIS — R279 Unspecified lack of coordination: Secondary | ICD-10-CM | POA: Diagnosis not present

## 2022-03-12 DIAGNOSIS — R2689 Other abnormalities of gait and mobility: Secondary | ICD-10-CM | POA: Diagnosis not present

## 2022-03-12 DIAGNOSIS — M6281 Muscle weakness (generalized): Secondary | ICD-10-CM | POA: Diagnosis not present

## 2022-03-13 DIAGNOSIS — J8410 Pulmonary fibrosis, unspecified: Secondary | ICD-10-CM | POA: Diagnosis not present

## 2022-03-14 DIAGNOSIS — J8410 Pulmonary fibrosis, unspecified: Secondary | ICD-10-CM | POA: Diagnosis not present

## 2022-03-15 DIAGNOSIS — R296 Repeated falls: Secondary | ICD-10-CM | POA: Diagnosis not present

## 2022-03-15 DIAGNOSIS — M6281 Muscle weakness (generalized): Secondary | ICD-10-CM | POA: Diagnosis not present

## 2022-03-15 DIAGNOSIS — R279 Unspecified lack of coordination: Secondary | ICD-10-CM | POA: Diagnosis not present

## 2022-03-15 DIAGNOSIS — R2689 Other abnormalities of gait and mobility: Secondary | ICD-10-CM | POA: Diagnosis not present

## 2022-03-16 ENCOUNTER — Encounter: Payer: Medicare Other | Admitting: Family Medicine

## 2022-03-16 DIAGNOSIS — R278 Other lack of coordination: Secondary | ICD-10-CM | POA: Diagnosis not present

## 2022-03-16 DIAGNOSIS — R296 Repeated falls: Secondary | ICD-10-CM | POA: Diagnosis not present

## 2022-03-16 DIAGNOSIS — I639 Cerebral infarction, unspecified: Secondary | ICD-10-CM | POA: Diagnosis not present

## 2022-03-16 DIAGNOSIS — M6281 Muscle weakness (generalized): Secondary | ICD-10-CM | POA: Diagnosis not present

## 2022-03-17 ENCOUNTER — Inpatient Hospital Stay: Payer: Medicare Other

## 2022-03-17 DIAGNOSIS — R2689 Other abnormalities of gait and mobility: Secondary | ICD-10-CM | POA: Diagnosis not present

## 2022-03-17 DIAGNOSIS — R279 Unspecified lack of coordination: Secondary | ICD-10-CM | POA: Diagnosis not present

## 2022-03-17 DIAGNOSIS — R296 Repeated falls: Secondary | ICD-10-CM | POA: Diagnosis not present

## 2022-03-17 DIAGNOSIS — M6281 Muscle weakness (generalized): Secondary | ICD-10-CM | POA: Diagnosis not present

## 2022-03-18 DIAGNOSIS — I639 Cerebral infarction, unspecified: Secondary | ICD-10-CM | POA: Diagnosis not present

## 2022-03-18 DIAGNOSIS — R279 Unspecified lack of coordination: Secondary | ICD-10-CM | POA: Diagnosis not present

## 2022-03-18 DIAGNOSIS — M6281 Muscle weakness (generalized): Secondary | ICD-10-CM | POA: Diagnosis not present

## 2022-03-18 DIAGNOSIS — R296 Repeated falls: Secondary | ICD-10-CM | POA: Diagnosis not present

## 2022-03-18 DIAGNOSIS — R278 Other lack of coordination: Secondary | ICD-10-CM | POA: Diagnosis not present

## 2022-03-18 DIAGNOSIS — R2689 Other abnormalities of gait and mobility: Secondary | ICD-10-CM | POA: Diagnosis not present

## 2022-03-19 ENCOUNTER — Non-Acute Institutional Stay: Payer: Medicare Other | Admitting: Family Medicine

## 2022-03-19 VITALS — BP 108/64 | HR 76 | Resp 16

## 2022-03-19 DIAGNOSIS — R278 Other lack of coordination: Secondary | ICD-10-CM | POA: Diagnosis not present

## 2022-03-19 DIAGNOSIS — J9611 Chronic respiratory failure with hypoxia: Secondary | ICD-10-CM

## 2022-03-19 DIAGNOSIS — G8929 Other chronic pain: Secondary | ICD-10-CM | POA: Diagnosis not present

## 2022-03-19 DIAGNOSIS — J849 Interstitial pulmonary disease, unspecified: Secondary | ICD-10-CM | POA: Diagnosis not present

## 2022-03-19 DIAGNOSIS — I639 Cerebral infarction, unspecified: Secondary | ICD-10-CM | POA: Diagnosis not present

## 2022-03-19 DIAGNOSIS — M6281 Muscle weakness (generalized): Secondary | ICD-10-CM | POA: Diagnosis not present

## 2022-03-19 DIAGNOSIS — M25511 Pain in right shoulder: Secondary | ICD-10-CM | POA: Diagnosis not present

## 2022-03-19 DIAGNOSIS — R296 Repeated falls: Secondary | ICD-10-CM | POA: Diagnosis not present

## 2022-03-19 NOTE — Progress Notes (Signed)
? ? ?Manufacturing engineer ?Community Palliative Care Consult Note ?Telephone: [phone]  ?Fax: [phone]  ? ? ?Date of encounter: 03/19/22 ?12:05 PM ?PATIENT NAME: Melody Ochoa ?[place] [place] ?[place] [address]   ?[phone] (home)  ?DOB: 26-May-1941 ?MRN: [id number] ?PRIMARY CARE PROVIDER:    ?Burnard Hawthorne, FNP,  ?[phone] ?[place] [address] ?[phone] ? ?REFERRING PROVIDER:   ?Burnard Hawthorne, FNP ?[phone] ?[address] ?[place],  [place] [address] ?[phone] ? ?RESPONSIBLE PARTY:    ?Contact Information   ? ? Name Relation Home Work Mobile  ? [place] Friend   [phone]  ? Wilsey Spouse [phone]  [phone]  ? ?  ? ? ? ?I met face to face with patient and paid caregiver Melody Ochoa in her assisted living facility. Palliative Care was asked to follow this patient by consultation request of  Arnett, Yvetta Coder, FNP to address advance care planning and complex medical decision making. This is a follow up visit. ? ? ASSESSMENT , SYMPTOM MANAGEMENT AND PLAN / RECOMMENDATIONS:  ? Chronic respiratory failure due to interstitial lung disease ?Continue to monitor and if unable to maintain O2 sats > 90%, advised to contact Palliative Care or Pulmonologist. ?Continue 3L at rest and 4L with exertion. ?Continue Albuterol MDI Q 6 hrs prn, Flonase 2 sprays each nostril daily, Guaifenesin 1200 mg po change from BID prn to BID, DuoNeb Q 6 hrs prn in place of Albuterol if needed. ? ? Right shoulder pain ?Continues on Lyrica 50 mg in am and 100 mg QHS. ?Needs to follow up with [facility] for possible frozen shoulder, shoulder impingement and/or possible biceps tear. ? ? ?Advance Care Planning/Goals of Care: Goals include to maximize quality of life and symptom management. Need to address with patient and [facility] POA-Melody Ochoa. ?CODE STATUS: ?Currently full code ? ? ? ?Follow up Palliative Care Visit: Palliative care will continue to follow for complex medical  decision making, advance care planning, and clarification of goals. Return 4 weeks or prn. ? ? ?This visit was coded based on medical decision making (MDM). ? ?PPS: 50% ? ?HOSPICE ELIGIBILITY/DIAGNOSIS: TBD ? ?Chief Complaint:  ?Palliative Care is following for chronic medical management of chronic respiratory failure due to idiopathic fibrosis, now with c/o right shoulder pain and limited ROM. ? ?HISTORY OF PRESENT ILLNESS:  Melody Ochoa is a 81 y.o. year old female with chronic respiratory failure due to interstitial lung disease, with hx of stroke and hip replacement.  She also has physical debility, memory deficit, chronic SI joint pain, depression in remission, HLD, chronic low back pain, CKD, spinal stenosis and generalized OA.  Pt had a fall when still living at home and hit right shoulder, saw [facility] and had a course of prednisone but has not helped.  Now waking her up at night intermittently "hurting like a gnawing pain".  Has pain and reduced ROM.  She was told that if oral steroid treatment ineffective she may need to have steroid injection. Pain in biceps with palpation over shoulder joint.  She states that she has trouble with flexion of her elbow in trying to get her hand to her mouth to eat.  She is right hand dominant.  She is unable to lift her arm without having to make adjustments in the movement to even allow it and has referred pain into biceps.  Having trouble with her O2 at 3L except when working with PT when she increases to 4L/min. Per  her caregiver Melody Ochoa, she went to the bathroom independently wearing her O2 this am and her sats dropped to 65%, took 4-5 minutes on O2 to get up to 90s. Pt c/o epigastric pain in stomach after eating that just started.  She had a visit with her spouse who is in same facility but in memory care earlier today which did not go well.  He was angry and told her "You stink!" As she said the same to him.  She recognizes she is unable to assist with spouse's  bathing. ? ?History obtained from review of EMR, discussion with paid caregiver and/or Melody Ochoa.  ?I reviewed available labs, medications, imaging, studies and related documents from the EMR.  Records reviewed and summarized above.  ? ?ROS ? ?General: NAD ?EYES: denies vision changes ?ENMT: denies dysphagia ?Cardiovascular: denies chest pain, endorses DOE ?Pulmonary: denies cough, endorses increased SOB ?Abdomen: endorses good appetite, denies constipation, endorses continence of bowel ?GU: denies dysuria, endorses continence of urine ?MSK:  denies increased weakness but activity takes more of her reserve,  no recent falls reported.  Pain in right shoulder with limitations in ROM ?Skin: denies rashes or wounds ?Neurological: pain in right shoulder interrupting sleep ?Psych: Endorses positive mood ?Heme/lymph/immuno: denies bruises, abnormal bleeding ? ?Physical Exam: ?Current and past weights: 162 lbs as of 02/11/22 ?Constitutional: NAD ?General: WNWD ?EYES: anicteric sclera, lids intact, no discharge  ?ENMT: intact hearing, oral mucous membranes moist, dentition intact ?CV: S1S2, RRR with LUSB murmur, no LE edema ?Pulmonary: CTA except fine crackles in LLL, no increased work of breathing, no cough, on 3L O2 ?Abdomen: inormo-active BS + 4 quadrants, soft and non tender, no ascites ?GU: deferred ?MSK: no sarcopenia, moves all extremities, ambulatory for short distances, primarily uses WC for mobility ?Skin: warm and dry, no rashes or wounds on visible skin ?Neuro:  no generalized weakness,  no cognitive impairment ?Psych: non-anxious affect, A and O x 3 ?Hem/lymph/immuno: no widespread bruising ? ? ?Thank you for the opportunity to participate in the care of Melody Ochoa.  The palliative care team will continue to follow. Please call our office at [phone] if we can be of additional assistance.  ? ?Marijo Conception, FNP -C ? ? ?COVID-19 PATIENT SCREENING TOOL ?Asked and negative response unless otherwise noted:   ? ?Have you had symptoms of covid, tested positive or been in contact with someone with symptoms/positive test in the past 5-10 days? No ? ?

## 2022-03-21 ENCOUNTER — Encounter: Payer: Self-pay | Admitting: Family Medicine

## 2022-03-22 ENCOUNTER — Telehealth: Payer: Self-pay | Admitting: Family Medicine

## 2022-03-22 DIAGNOSIS — M6281 Muscle weakness (generalized): Secondary | ICD-10-CM | POA: Diagnosis not present

## 2022-03-22 DIAGNOSIS — R296 Repeated falls: Secondary | ICD-10-CM | POA: Diagnosis not present

## 2022-03-22 DIAGNOSIS — R2689 Other abnormalities of gait and mobility: Secondary | ICD-10-CM | POA: Diagnosis not present

## 2022-03-22 DIAGNOSIS — R279 Unspecified lack of coordination: Secondary | ICD-10-CM | POA: Diagnosis not present

## 2022-03-22 NOTE — Telephone Encounter (Signed)
Spoke with hc [employer] and advised of issues with desaturation on O2 and also pain with limited ROM right shoulder. She states that her orthopedist at [facility] was set to give her a steroid injection but the pt refused.  She plans to have pt follow up with [facility] herself to schedule the appointment stating she is more likely to allow the injection.  Advised that it would be optimal to follow up injection with PT outpatient to successfully regain ROM.  Discussed contacting the Pulmonologist about pt's recent desaturations while wearing O2 to see if she wants to see pt sooner than her scheduled June appointment or has recommendation.  Message sent via Epic to Dr Patsey Berthold. ?Damaris Hippo FNP-C ?

## 2022-03-23 DIAGNOSIS — G3184 Mild cognitive impairment, so stated: Secondary | ICD-10-CM | POA: Diagnosis not present

## 2022-03-23 DIAGNOSIS — M6281 Muscle weakness (generalized): Secondary | ICD-10-CM | POA: Diagnosis not present

## 2022-03-23 DIAGNOSIS — I639 Cerebral infarction, unspecified: Secondary | ICD-10-CM | POA: Diagnosis not present

## 2022-03-23 DIAGNOSIS — Z9981 Dependence on supplemental oxygen: Secondary | ICD-10-CM | POA: Diagnosis not present

## 2022-03-23 DIAGNOSIS — J449 Chronic obstructive pulmonary disease, unspecified: Secondary | ICD-10-CM | POA: Diagnosis not present

## 2022-03-23 DIAGNOSIS — R278 Other lack of coordination: Secondary | ICD-10-CM | POA: Diagnosis not present

## 2022-03-23 DIAGNOSIS — R296 Repeated falls: Secondary | ICD-10-CM | POA: Diagnosis not present

## 2022-03-24 DIAGNOSIS — R2689 Other abnormalities of gait and mobility: Secondary | ICD-10-CM | POA: Diagnosis not present

## 2022-03-24 DIAGNOSIS — R296 Repeated falls: Secondary | ICD-10-CM | POA: Diagnosis not present

## 2022-03-24 DIAGNOSIS — R279 Unspecified lack of coordination: Secondary | ICD-10-CM | POA: Diagnosis not present

## 2022-03-24 DIAGNOSIS — M6281 Muscle weakness (generalized): Secondary | ICD-10-CM | POA: Diagnosis not present

## 2022-03-25 DIAGNOSIS — R278 Other lack of coordination: Secondary | ICD-10-CM | POA: Diagnosis not present

## 2022-03-25 DIAGNOSIS — R279 Unspecified lack of coordination: Secondary | ICD-10-CM | POA: Diagnosis not present

## 2022-03-25 DIAGNOSIS — I639 Cerebral infarction, unspecified: Secondary | ICD-10-CM | POA: Diagnosis not present

## 2022-03-25 DIAGNOSIS — M6281 Muscle weakness (generalized): Secondary | ICD-10-CM | POA: Diagnosis not present

## 2022-03-25 DIAGNOSIS — R2689 Other abnormalities of gait and mobility: Secondary | ICD-10-CM | POA: Diagnosis not present

## 2022-03-25 DIAGNOSIS — R296 Repeated falls: Secondary | ICD-10-CM | POA: Diagnosis not present

## 2022-03-26 DIAGNOSIS — R278 Other lack of coordination: Secondary | ICD-10-CM | POA: Diagnosis not present

## 2022-03-26 DIAGNOSIS — I639 Cerebral infarction, unspecified: Secondary | ICD-10-CM | POA: Diagnosis not present

## 2022-03-26 DIAGNOSIS — M6281 Muscle weakness (generalized): Secondary | ICD-10-CM | POA: Diagnosis not present

## 2022-03-26 DIAGNOSIS — R296 Repeated falls: Secondary | ICD-10-CM | POA: Diagnosis not present

## 2022-03-29 DIAGNOSIS — R296 Repeated falls: Secondary | ICD-10-CM | POA: Diagnosis not present

## 2022-03-29 DIAGNOSIS — M6281 Muscle weakness (generalized): Secondary | ICD-10-CM | POA: Diagnosis not present

## 2022-03-29 DIAGNOSIS — R2689 Other abnormalities of gait and mobility: Secondary | ICD-10-CM | POA: Diagnosis not present

## 2022-03-29 DIAGNOSIS — R279 Unspecified lack of coordination: Secondary | ICD-10-CM | POA: Diagnosis not present

## 2022-03-31 DIAGNOSIS — R278 Other lack of coordination: Secondary | ICD-10-CM | POA: Diagnosis not present

## 2022-03-31 DIAGNOSIS — R279 Unspecified lack of coordination: Secondary | ICD-10-CM | POA: Diagnosis not present

## 2022-03-31 DIAGNOSIS — R2689 Other abnormalities of gait and mobility: Secondary | ICD-10-CM | POA: Diagnosis not present

## 2022-03-31 DIAGNOSIS — I639 Cerebral infarction, unspecified: Secondary | ICD-10-CM | POA: Diagnosis not present

## 2022-03-31 DIAGNOSIS — R296 Repeated falls: Secondary | ICD-10-CM | POA: Diagnosis not present

## 2022-03-31 DIAGNOSIS — M6281 Muscle weakness (generalized): Secondary | ICD-10-CM | POA: Diagnosis not present

## 2022-04-01 DIAGNOSIS — R279 Unspecified lack of coordination: Secondary | ICD-10-CM | POA: Diagnosis not present

## 2022-04-01 DIAGNOSIS — M6281 Muscle weakness (generalized): Secondary | ICD-10-CM | POA: Diagnosis not present

## 2022-04-01 DIAGNOSIS — I639 Cerebral infarction, unspecified: Secondary | ICD-10-CM | POA: Diagnosis not present

## 2022-04-01 DIAGNOSIS — R2689 Other abnormalities of gait and mobility: Secondary | ICD-10-CM | POA: Diagnosis not present

## 2022-04-01 DIAGNOSIS — R296 Repeated falls: Secondary | ICD-10-CM | POA: Diagnosis not present

## 2022-04-01 DIAGNOSIS — R278 Other lack of coordination: Secondary | ICD-10-CM | POA: Diagnosis not present

## 2022-04-02 DIAGNOSIS — R296 Repeated falls: Secondary | ICD-10-CM | POA: Diagnosis not present

## 2022-04-02 DIAGNOSIS — R278 Other lack of coordination: Secondary | ICD-10-CM | POA: Diagnosis not present

## 2022-04-02 DIAGNOSIS — M6281 Muscle weakness (generalized): Secondary | ICD-10-CM | POA: Diagnosis not present

## 2022-04-02 DIAGNOSIS — I639 Cerebral infarction, unspecified: Secondary | ICD-10-CM | POA: Diagnosis not present

## 2022-04-05 DIAGNOSIS — R278 Other lack of coordination: Secondary | ICD-10-CM | POA: Diagnosis not present

## 2022-04-05 DIAGNOSIS — R296 Repeated falls: Secondary | ICD-10-CM | POA: Diagnosis not present

## 2022-04-05 DIAGNOSIS — I639 Cerebral infarction, unspecified: Secondary | ICD-10-CM | POA: Diagnosis not present

## 2022-04-05 DIAGNOSIS — M6281 Muscle weakness (generalized): Secondary | ICD-10-CM | POA: Diagnosis not present

## 2022-04-06 DIAGNOSIS — R296 Repeated falls: Secondary | ICD-10-CM | POA: Diagnosis not present

## 2022-04-06 DIAGNOSIS — I639 Cerebral infarction, unspecified: Secondary | ICD-10-CM | POA: Diagnosis not present

## 2022-04-06 DIAGNOSIS — R2689 Other abnormalities of gait and mobility: Secondary | ICD-10-CM | POA: Diagnosis not present

## 2022-04-06 DIAGNOSIS — M6281 Muscle weakness (generalized): Secondary | ICD-10-CM | POA: Diagnosis not present

## 2022-04-06 DIAGNOSIS — R279 Unspecified lack of coordination: Secondary | ICD-10-CM | POA: Diagnosis not present

## 2022-04-06 DIAGNOSIS — R278 Other lack of coordination: Secondary | ICD-10-CM | POA: Diagnosis not present

## 2022-04-07 DIAGNOSIS — R2689 Other abnormalities of gait and mobility: Secondary | ICD-10-CM | POA: Diagnosis not present

## 2022-04-07 DIAGNOSIS — R278 Other lack of coordination: Secondary | ICD-10-CM | POA: Diagnosis not present

## 2022-04-07 DIAGNOSIS — R279 Unspecified lack of coordination: Secondary | ICD-10-CM | POA: Diagnosis not present

## 2022-04-07 DIAGNOSIS — M6281 Muscle weakness (generalized): Secondary | ICD-10-CM | POA: Diagnosis not present

## 2022-04-07 DIAGNOSIS — R296 Repeated falls: Secondary | ICD-10-CM | POA: Diagnosis not present

## 2022-04-07 DIAGNOSIS — I639 Cerebral infarction, unspecified: Secondary | ICD-10-CM | POA: Diagnosis not present

## 2022-04-12 DIAGNOSIS — J8410 Pulmonary fibrosis, unspecified: Secondary | ICD-10-CM | POA: Diagnosis not present

## 2022-04-13 ENCOUNTER — Encounter: Payer: Medicare Other | Admitting: Family Medicine

## 2022-04-13 DIAGNOSIS — R296 Repeated falls: Secondary | ICD-10-CM | POA: Diagnosis not present

## 2022-04-13 DIAGNOSIS — M6281 Muscle weakness (generalized): Secondary | ICD-10-CM | POA: Diagnosis not present

## 2022-04-13 DIAGNOSIS — R2689 Other abnormalities of gait and mobility: Secondary | ICD-10-CM | POA: Diagnosis not present

## 2022-04-13 DIAGNOSIS — J8410 Pulmonary fibrosis, unspecified: Secondary | ICD-10-CM | POA: Diagnosis not present

## 2022-04-13 DIAGNOSIS — R279 Unspecified lack of coordination: Secondary | ICD-10-CM | POA: Diagnosis not present

## 2022-04-14 ENCOUNTER — Encounter: Payer: Self-pay | Admitting: Family Medicine

## 2022-04-14 ENCOUNTER — Emergency Department (HOSPITAL_COMMUNITY): Payer: Medicare Other

## 2022-04-14 ENCOUNTER — Emergency Department (HOSPITAL_COMMUNITY)
Admission: EM | Admit: 2022-04-14 | Discharge: 2022-04-14 | Disposition: A | Discharge status: Home / Self Care | Payer: Medicare Other | Attending: Emergency Medicine | Admitting: Emergency Medicine

## 2022-04-14 ENCOUNTER — Non-Acute Institutional Stay: Payer: Medicare Other | Admitting: Family Medicine

## 2022-04-14 VITALS — BP 144/86 | HR 85 | Resp 28

## 2022-04-14 DIAGNOSIS — R5383 Other fatigue: Secondary | ICD-10-CM | POA: Diagnosis not present

## 2022-04-14 DIAGNOSIS — R0600 Dyspnea, unspecified: Secondary | ICD-10-CM | POA: Diagnosis not present

## 2022-04-14 DIAGNOSIS — J9621 Acute and chronic respiratory failure with hypoxia: Secondary | ICD-10-CM | POA: Insufficient documentation

## 2022-04-14 DIAGNOSIS — R0602 Shortness of breath: Secondary | ICD-10-CM | POA: Insufficient documentation

## 2022-04-14 DIAGNOSIS — Z743 Need for continuous supervision: Secondary | ICD-10-CM | POA: Diagnosis not present

## 2022-04-14 DIAGNOSIS — Z79899 Other long term (current) drug therapy: Secondary | ICD-10-CM | POA: Diagnosis not present

## 2022-04-14 DIAGNOSIS — Z91040 Latex allergy status: Secondary | ICD-10-CM | POA: Diagnosis not present

## 2022-04-14 DIAGNOSIS — R0902 Hypoxemia: Secondary | ICD-10-CM | POA: Diagnosis not present

## 2022-04-14 DIAGNOSIS — R059 Cough, unspecified: Secondary | ICD-10-CM | POA: Insufficient documentation

## 2022-04-14 DIAGNOSIS — J441 Chronic obstructive pulmonary disease with (acute) exacerbation: Secondary | ICD-10-CM | POA: Diagnosis not present

## 2022-04-14 DIAGNOSIS — R6889 Other general symptoms and signs: Secondary | ICD-10-CM | POA: Diagnosis not present

## 2022-04-14 DIAGNOSIS — J449 Chronic obstructive pulmonary disease, unspecified: Secondary | ICD-10-CM | POA: Diagnosis not present

## 2022-04-14 DIAGNOSIS — Z7951 Long term (current) use of inhaled steroids: Secondary | ICD-10-CM | POA: Diagnosis not present

## 2022-04-14 DIAGNOSIS — I10 Essential (primary) hypertension: Secondary | ICD-10-CM | POA: Diagnosis not present

## 2022-04-14 DIAGNOSIS — M6281 Muscle weakness (generalized): Secondary | ICD-10-CM | POA: Diagnosis not present

## 2022-04-14 LAB — BASIC METABOLIC PANEL
Anion gap: 5 (ref 5–15)
BUN: 11 mg/dL (ref 8–23)
CO2: 27 mmol/L (ref 22–32)
Calcium: 9 mg/dL (ref 8.9–10.3)
Chloride: 108 mmol/L (ref 98–111)
Creatinine, Ser: 0.96 mg/dL (ref 0.44–1.00)
GFR, Estimated: 59 mL/min — ABNORMAL LOW (ref >60)
Glucose, Bld: 95 mg/dL (ref 70–99)
Potassium: 3.8 mmol/L (ref 3.5–5.1)
Sodium: 140 mmol/L (ref 135–145)

## 2022-04-14 LAB — CBC WITH DIFFERENTIAL/PLATELET
Abs Immature Granulocytes: 0.02 10*3/uL (ref 0.00–0.07)
Basophils Absolute: 0.1 10*3/uL (ref 0.0–0.1)
Basophils Relative: 1 %
Eosinophils Absolute: 0.7 10*3/uL — ABNORMAL HIGH (ref 0.0–0.5)
Eosinophils Relative: 8 %
HCT: 36.4 % (ref 36.0–46.0)
Hemoglobin: 11.2 g/dL — ABNORMAL LOW (ref 12.0–15.0)
Immature Granulocytes: 0 %
Lymphocytes Relative: 18 %
Lymphs Abs: 1.4 10*3/uL (ref 0.7–4.0)
MCH: 26.6 pg (ref 26.0–34.0)
MCHC: 30.8 g/dL (ref 30.0–36.0)
MCV: 86.5 fL (ref 80.0–100.0)
Monocytes Absolute: 0.6 10*3/uL (ref 0.1–1.0)
Monocytes Relative: 7 %
Neutro Abs: 5.1 10*3/uL (ref 1.7–7.7)
Neutrophils Relative %: 66 %
Platelets: 204 10*3/uL (ref 150–400)
RBC: 4.21 MIL/uL (ref 3.87–5.11)
RDW: 15.8 % — ABNORMAL HIGH (ref 11.5–15.5)
WBC: 7.7 10*3/uL (ref 4.0–10.5)
nRBC: 0 % (ref 0.0–0.2)

## 2022-04-14 LAB — BRAIN NATRIURETIC PEPTIDE: B Natriuretic Peptide: 384.8 pg/mL — ABNORMAL HIGH (ref 0.0–100.0)

## 2022-04-14 MED ORDER — METHYLPREDNISOLONE 4 MG PO TBPK: ORAL_TABLET | ORAL | 0 refills | Status: DC

## 2022-04-14 MED ORDER — DOXYCYCLINE HYCLATE 100 MG PO CAPS: 100.0000 mg | ORAL_CAPSULE | Freq: Two times a day (BID) | ORAL | 0 refills | Status: DC

## 2022-04-14 MED ORDER — METHYLPREDNISOLONE 4 MG PO TBPK: ORAL_TABLET | ORAL | 0 refills | Status: AC

## 2022-04-14 MED ORDER — DOXYCYCLINE HYCLATE 100 MG PO CAPS: 100.0000 mg | ORAL_CAPSULE | Freq: Two times a day (BID) | ORAL | 0 refills | Status: AC

## 2022-04-14 NOTE — Progress Notes (Signed)
? ? ?Manufacturing engineer ?Community Palliative Care Consult Note ?Telephone: [phone]  ?Fax: [phone]  ? ? ?Date of encounter: 04/14/22 ?9:27 AM ?PATIENT NAME: MIU CHIONG ?[place] ?[place] [address]   ?[phone] (home)  ?DOB: 1941-11-22 ?MRN: [id number] ?PRIMARY CARE PROVIDER:    ?Burnard Hawthorne, FNP,  ?[phone] [address] ?[place] [address] ?[phone] ? ?REFERRING PROVIDER:   ?Burnard Hawthorne, FNP ?[phone] [address] ?[address] ?[place],  [place] [address] ?[phone] ? ?RESPONSIBLE PARTY:    ?Contact Information   ? ? Name Relation Home Work Mobile  ? [place] Friend   [phone]  ? [place] Spouse [phone]  [phone]  ? ?  ? ? ? ?I met face to face with patient and paid caregiver Marcie Bal in her assisted living facility. Palliative Care was asked to follow this patient by consultation request of  Arnett, Yvetta Coder, FNP to address advance care planning and complex medical decision making. This is a follow up visit. ? ? ASSESSMENT , SYMPTOM MANAGEMENT AND PLAN / RECOMMENDATIONS:  ?Acute on Chronic respiratory failure with hypoxic due to interstitial lung disease ?Increased to 5L, discussed with [facility] POA and will be sent by EMS to ER. ? ? ? ?Advance Care Planning/Goals of Care: Goals include to maximize quality of life and symptom management. Need to address with patient and [facility] POA-[name]. ?CODE STATUS: ?Currently full code ? ? ? ?Follow up Palliative Care Visit: Palliative care will continue to follow for complex medical decision making, advance care planning, and clarification of goals. Return 4 weeks or prn. ? ? ?This visit was coded based on medical decision making (MDM). ? ?PPS: 50% ? ?HOSPICE ELIGIBILITY/DIAGNOSIS: TBD ? ?Chief Complaint:  ?Palliative Care is following for chronic medical management of chronic respiratory failure due to idiopathic fibrosis, now with c/o right shoulder pain and limited ROM. ? ?HISTORY OF PRESENT ILLNESS:  MANESSA BULEY is a 81 y.o. year old female with chronic respiratory failure due to interstitial lung disease, with hx of stroke and hip replacement.  She also has physical debility, memory deficit, chronic SI joint pain, depression in remission, HLD, chronic low back pain, CKD, spinal stenosis and generalized OA. Pt has paid caregiver Tues and Thurs who noted that pt was dropping her sats when she was up to the bathroom into the low 80s while on O2.  Spoke with [facility] POA who says she was noted with repeat trip to bathroom to drop in the low 70s per [facility] POA.  [facility] POA had staff checking on her last night and sats were decreased when laying in bed but pt refused to go to the ER.  On arrival, pt is supine on 1 pillow with O2 sat 79% on 3L [place].  She is visibly dyspneic and cannot speak in complete sentences without having to stop to breathe.  Endorses increased fatigue with any exertion, SOB while at rest. Denies PND but endorses orthopnea.  Pt helped and repositioned on 3 pillows with improvement in subjective c/o's but continued dyspnea and increased work of breathing.  ? ?History obtained from review of EMR, discussion with paid caregiver and/or Ms. Oxley.  ?I reviewed available labs, medications, imaging, studies and related documents from the EMR.  Records reviewed and summarized above.  ? ?ROS ? ?General: Endorses discomfort with breathing ?EYES: denies vision changes ?ENMT: denies dysphagia ?Cardiovascular: denies chest pain, endorses DOE with decreased activity tolerance and increased fatigue ?Pulmonary: denies cough, endorses increased SOB ?Abdomen:  endorses good appetite, denies constipation, endorses continence of bowel ?GU: denies dysuria, incontinence of urine ?MSK:  reports increased weakness with increased dyspnea,  no recent falls reported.  Pain in right shoulder with limitations in ROM ?Skin: denies rashes or wounds ?Neurological: pain in right shoulder  ?Psych: Endorses positive mood ?Heme/lymph/immuno:  denies bruises, abnormal bleeding ? ?Physical Exam: ?Current and past weights: 162 lbs as of 02/11/22 ?Constitutional: Visibly dyspneic, improves with sitting up ?General: WNWD ?EYES: anicteric sclera, lids intact, no discharge  ?ENMT: intact hearing, oral mucous membranes moist, dentition intact ?CV: S1S2, RRR with LUSB murmur,  trace RLE, 1+ LLE edema ?Pulmonary: Pleural friction rub noted in Left lung, coarse crackles on left, diminished on right, tachypneic with increased work of breathing, occasional non-productive cough, on 3L O2 increased to 5L ?Abdomen: normo-active BS + 4 quadrants, soft and non tender, no ascites ?GU: Light amber urine in saturated brief-just voided, pericare given and changed into clean dry brief ?MSK: no sarcopenia, moves all extremities, ambulatory for short distances, primarily uses WC for mobility. Some limited ROM right shoulder through all ranges ?Skin: warm and dry, no rashes or wounds on visible skin ?Neuro:  no generalized weakness,  mild cognitive impairment ?Psych: non-anxious affect, A and O x 3 ?Hem/lymph/immuno: no widespread bruising ? ? ?Thank you for the opportunity to participate in the care of Ms. Peavler.  The palliative care team will continue to follow. Please call our office at [phone] if we can be of additional assistance.  ? ?Marijo Conception, FNP -C ? ? ?COVID-19 PATIENT SCREENING TOOL ?Asked and negative response unless otherwise noted:  ? ?Have you had symptoms of covid, tested positive or been in contact with someone with symptoms/positive test in the past 5-10 days? No ? ?

## 2022-04-14 NOTE — ED Provider Triage Note (Signed)
Emergency Medicine Provider Triage Evaluation Note ? ?Melody Ochoa , a 81 y.o. female  was evaluated in triage.  Pt complains of sob. Hx of COPD on 3L O2 here with increase SOB x 2 days.  Has productive cough, denies fever or cp ? ?Review of Systems  ?Positive: As above ?Negative: As above ? ?Physical Exam  ?BP (!) 144/85 (BP Location: Right Arm)   Pulse 86   Temp 98 ?F (36.7 ?C)   Resp 18   SpO2 93%  ?Gen:   Awake, no distress   ?Resp:  Normal effort  ?MSK:   Moves extremities without difficulty  ?Other:  Crackles heard in lungs bilaterally ? ?Medical Decision Making  ?Medically screening exam initiated at 10:15 AM.  Appropriate orders placed.  Melody Ochoa was informed that the remainder of the evaluation will be completed by another provider, this initial triage assessment does not replace that evaluation, and the importance of remaining in the ED until their evaluation is complete. ? ? ?  ?Domenic Moras, PA-C ?04/14/22 1020 ? ?

## 2022-04-14 NOTE — Progress Notes (Signed)
[facility]) Hospital Liaison note: ? ?This patient is currently enrolled in [facility] outpatient-based Palliative Care. Will continue to follow for disposition. ? ?Please call with any outpatient palliative questions or concerns. ? ?Thank you, ?Lorelee Market, LPN ?[facility] Hospital Liaison ?[phone] ?

## 2022-04-14 NOTE — ED Provider Notes (Signed)
?[facility] ?Provider Note ? ? ?CSN: [id number] ?Arrival date & time: 04/14/22  1006 ? ?  ? ?History ? ?Chief Complaint  ?Patient presents with  ? Shortness of Breath  ? ? ?Melody Ochoa is a 81 y.o. female. ? ?HPI ? ?81 year old female with past medical history of lung fibrosis/COPD on chronic oxygen presents emergency department concern for hypoxic readings yesterday.  She is from assisted living.  Patient has a baseline cough that over the past couple days has been productive of green phlegm with fatigue.  No documented fever.  No chest pain.  No swelling of her lower extremities.  No recent sick contacts. ? ?Home Medications ?Prior to Admission medications   ?Medication Sig Start Date End Date Taking? Authorizing Provider  ?doxycycline (VIBRAMYCIN) 100 MG capsule Take 1 capsule (100 mg total) by mouth 2 (two) times daily. 04/14/22  Yes Dashley Monts, Alvin Critchley, DO  ?methylPREDNISolone (MEDROL DOSEPAK) 4 MG TBPK tablet Take as directed 04/14/22  Yes Theoden Mauch, Alvin Critchley, DO  ?ACETAMINOPHEN EXTRA STRENGTH 500 MG tablet TAKE 2 TABS ('1000MG'$ ) BY MOUTH EVERY 6 HOURS AS NEEDED FOR PAIN 01/26/22   Burnard Hawthorne, FNP  ?albuterol (VENTOLIN HFA) 108 (90 Base) MCG/ACT inhaler INHALE 2 PUFFS BY MOUTH EVERY 6 HOURS AS NEEDED FOR SHORTNESS OF BREATH / WHEEZE 01/26/22   Burnard Hawthorne, FNP  ?atorvastatin (LIPITOR) 80 MG tablet TAKE ONE TABLET BY MOUTH EVERY DAY 12/08/21   Burnard Hawthorne, FNP  ?buPROPion (WELLBUTRIN XL) 300 MG 24 hr tablet TAKE 1 TABLET BY MOUTH DAILY 12/08/21   Burnard Hawthorne, FNP  ?clopidogrel (PLAVIX) 75 MG tablet TAKE ONE TABLET BY MOUTH EVERY DAY 01/12/22   Burnard Hawthorne, FNP  ?docusate sodium (COLACE) 100 MG capsule Take 100 mg by mouth 2 (two) times daily.    [provider]  ?DULoxetine (CYMBALTA) 30 MG capsule TAKE 1 CAPSULE BY MOUTH ONCE DAILY 12/08/21   Burnard Hawthorne, FNP  ?famotidine (PEPCID) 20 MG tablet TAKE 1 TAB BY MOUTH AT BEDTIME 02/09/22    Kennyth Arnold, FNP  ?feeding supplement (ENSURE ENLIVE / ENSURE PLUS) LIQD Take 237 mLs by mouth 3 (three) times daily between meals. 08/07/21   Loletha Grayer, MD  ?ferrous sulfate 325 (65 FE) MG tablet TAKE 1 TAB BY MOUTH EVERY DAY 02/17/22   Kennyth Arnold, FNP  ?fluticasone (FLONASE) 50 MCG/ACT nasal spray SPRAY 2 SPRAYS INTO EACH NOSTRIL EVERY DAY 01/22/22   Burnard Hawthorne, FNP  ?guaiFENesin (MUCINEX) 600 MG 12 hr tablet Take 2 tablets (1,200 mg total) by mouth 2 (two) times daily as needed. For back pain 01/22/22   Burnard Hawthorne, FNP  ?ipratropium-albuterol (DUONEB) 0.5-2.5 (3) MG/3ML SOLN Take 3 mLs by nebulization every 6 (six) hours as needed. 01/22/22   Burnard Hawthorne, FNP  ?pregabalin (LYRICA) 50 MG capsule TAKE 1 CAPSULE (50 MG) BY MOUTH EVERY DAY AND TAKE 2 CAPSULES (100 MG) AT BEDTIME. Max daily dose '150mg'$ /day. 01/12/22   Burnard Hawthorne, FNP  ?   ? ?Allergies    ?Latex, Aleve [naproxen sodium], Aspirin, Celebrex [celecoxib], Effexor [venlafaxine], Gabapentin, Hydrocodone bit-homatrop mbr, Ibuprofen, Mobic [meloxicam], Tape, Vioxx [rofecoxib], Other, and Tramadol   ? ?Review of Systems   ?Review of Systems  ?Constitutional:  Positive for fatigue. Negative for fever.  ?Respiratory:  Positive for cough and shortness of breath.   ?Cardiovascular:  Negative for chest pain and leg swelling.  ?Gastrointestinal:  Negative for abdominal  pain, diarrhea and vomiting.  ?Skin:  Negative for rash.  ?Neurological:  Negative for headaches.  ? ?Physical Exam ?Updated Vital Signs ?BP (!) 143/85   Pulse 80   Temp 98 ?F (36.7 ?C)   Resp (!) 21   SpO2 100%  ?Physical Exam ?Vitals and nursing note reviewed.  ?Constitutional:   ?   General: She is not in acute distress. ?   Appearance: Normal appearance. She is not diaphoretic.  ?HENT:  ?   Head: Normocephalic.  ?   Mouth/Throat:  ?   Mouth: Mucous membranes are moist.  ?Cardiovascular:  ?   Rate and Rhythm: Normal rate.  ?Pulmonary:  ?   Effort: Pulmonary  effort is normal. No tachypnea, accessory muscle usage or respiratory distress.  ?   Comments: Scattered rales, decreased breath sounds at bases ?Abdominal:  ?   Palpations: Abdomen is soft.  ?   Tenderness: There is no abdominal tenderness.  ?Musculoskeletal:  ?   Right lower leg: No edema.  ?   Left lower leg: No edema.  ?Skin: ?   General: Skin is warm.  ?Neurological:  ?   Mental Status: She is alert and oriented to person, place, and time. Mental status is at baseline.  ?Psychiatric:     ?   Mood and Affect: Mood normal.  ? ? ?ED Results / Procedures / Treatments   ?Labs ?(all labs ordered are listed, but only abnormal results are displayed) ?Labs Reviewed  ?BASIC METABOLIC PANEL - Abnormal; Notable for the following components:  ?    Result Value  ? GFR, Estimated 59 (*)   ? All other components within normal limits  ?CBC WITH DIFFERENTIAL/PLATELET - Abnormal; Notable for the following components:  ? Hemoglobin 11.2 (*)   ? RDW 15.8 (*)   ? Eosinophils Absolute 0.7 (*)   ? All other components within normal limits  ?BRAIN NATRIURETIC PEPTIDE - Abnormal; Notable for the following components:  ? B Natriuretic Peptide 384.8 (*)   ? All other components within normal limits  ? ? ?EKG ?EKG Interpretation ? ?Date/Time:  Wednesday Apr 14 2022 10:17:39 EDT ?Ventricular Rate:  86 ?PR Interval:  168 ?QRS Duration: 98 ?QT Interval:  432 ?QTC Calculation: 516 ?R Axis:   -64 ?Text Interpretation: Normal sinus rhythm Left axis deviation Moderate voltage criteria for LVH, may be normal variant ( R in aVL , Cornell product ) Possible Anterior infarct , age undetermined Prolonged QT Abnormal ECG When compared with ECG of 17-Nov-2021 11:52, PREVIOUS ECG IS PRESENT Confirmed by Lavenia Atlas [phone]) on 04/14/2022 2:13:08 PM ? ?Radiology ?DG Chest 2 View ? ?Result Date: 04/14/2022 ?CLINICAL DATA:  Shortness of breath and hypoxia. EXAM: CHEST - 2 VIEW COMPARISON:  Chest x-ray dated November 17, 2021. FINDINGS: Stable  cardiomediastinal silhouette. Unchanged loop recorder. Persistent low lung volumes with similar chronic interstitial lung disease/fibrosis. No focal consolidation, pleural effusion, or pneumothorax. 1.8 cm nodular density at the peripheral right lung base, not clearly seen on prior chest x-ray, but potentially increased in size compared to a chest CT from last October. Unchanged elevation of the right hemidiaphragm. No acute osseous abnormality. Prior lumbar fusion. IMPRESSION: 1. No acute cardiopulmonary disease. Similar chronic interstitial lung disease. 2. 1.8 cm nodular density at the peripheral right lung base, not clearly seen on prior chest x-ray, but potentially increased in size compared to a chest CT from last October. Repeat non-contrast chest CT is recommended. Electronically Signed   By: Orville Govern.D.  On: 04/14/2022 11:09   ? ?Procedures ?Procedures  ? ? ?Medications Ordered in ED ?Medications - No data to display ? ?ED Course/ Medical Decision Making/ A&P ?  ?                        ?Medical Decision Making ?Risk ?Prescription drug management. ? ? ?81 year old female presents emergency department with concern for episodes of hypoxia yesterday, noted at her assisted living facility.  She is currently on 3 L nasal cannula.  Patient has a baseline cough but admits over the past couple days it has been more productive of green phlegm associated with fatigue.  Denies any fever.  No chest pain or back pain.  No swelling of her lower extremities. ? ?Work-up here is reassuring.  EKG is similar to previous.  No active chest pain, low suspicion for ACS.  Chest x-ray shows no pneumonia, no findings of CHF.  BNP is just slightly elevated but low suspicion for CHF.  Patient has demonstrated no hypoxia here on her home requirement of nasal cannula.  Low suspicion for pulmonary embolism at this time in the absence of tachycardia/chest pain/ongoing shortness of breath/hypoxia.  Plan to treat for presumed  respiratory infection with new productive cough.  Patient at this time appears safe and stable for discharge and close outpatient follow up. Discharge plan and strict return to ED precautions discussed, patient verbalizes u

## 2022-04-14 NOTE — ED Triage Notes (Signed)
EMS [facility] and hx of COPD  , the past week a lot of SOB , rales in her lower lungs. Home nurse said she had Oxygen was 80% ?

## 2022-04-14 NOTE — Discharge Instructions (Addendum)
You have been seen and discharged from the emergency department.  Your work-up here was baseline and reassuring.  Take antibiotic and steroid for possible respiratory infection as directed.  Continue your home meds as prescribed.  Follow-up with your primary provider for further evaluation and further care. If you have any worsening symptoms or further concerns for your health please return to an emergency department for further evaluation. ?

## 2022-04-15 ENCOUNTER — Encounter: Payer: Medicare Other | Admitting: Family Medicine

## 2022-04-15 DIAGNOSIS — M6281 Muscle weakness (generalized): Secondary | ICD-10-CM | POA: Diagnosis not present

## 2022-04-15 DIAGNOSIS — R2689 Other abnormalities of gait and mobility: Secondary | ICD-10-CM | POA: Diagnosis not present

## 2022-04-15 DIAGNOSIS — R279 Unspecified lack of coordination: Secondary | ICD-10-CM | POA: Diagnosis not present

## 2022-04-15 DIAGNOSIS — R296 Repeated falls: Secondary | ICD-10-CM | POA: Diagnosis not present

## 2022-04-15 DIAGNOSIS — I639 Cerebral infarction, unspecified: Secondary | ICD-10-CM | POA: Diagnosis not present

## 2022-04-15 DIAGNOSIS — R278 Other lack of coordination: Secondary | ICD-10-CM | POA: Diagnosis not present

## 2022-04-16 ENCOUNTER — Non-Acute Institutional Stay: Payer: Medicare Other | Admitting: Family Medicine

## 2022-04-16 VITALS — BP 118/72 | HR 78 | Resp 20

## 2022-04-16 DIAGNOSIS — R279 Unspecified lack of coordination: Secondary | ICD-10-CM | POA: Diagnosis not present

## 2022-04-16 DIAGNOSIS — J441 Chronic obstructive pulmonary disease with (acute) exacerbation: Secondary | ICD-10-CM | POA: Diagnosis not present

## 2022-04-16 DIAGNOSIS — J9621 Acute and chronic respiratory failure with hypoxia: Secondary | ICD-10-CM | POA: Diagnosis not present

## 2022-04-16 DIAGNOSIS — R2689 Other abnormalities of gait and mobility: Secondary | ICD-10-CM | POA: Diagnosis not present

## 2022-04-16 DIAGNOSIS — M6281 Muscle weakness (generalized): Secondary | ICD-10-CM | POA: Diagnosis not present

## 2022-04-16 DIAGNOSIS — R278 Other lack of coordination: Secondary | ICD-10-CM | POA: Diagnosis not present

## 2022-04-16 DIAGNOSIS — R296 Repeated falls: Secondary | ICD-10-CM | POA: Diagnosis not present

## 2022-04-16 DIAGNOSIS — I639 Cerebral infarction, unspecified: Secondary | ICD-10-CM | POA: Diagnosis not present

## 2022-04-16 NOTE — Progress Notes (Signed)
? ? ?Manufacturing engineer ?Community Palliative Care Consult Note ?Telephone: [phone]  ?Fax: [phone]  ? ? ?Date of encounter: 04/16/22 ?10:51 AM ?PATIENT NAME: Melody Ochoa ?[address] ?[place] [address]   ?[phone] (home)  ?DOB: 05/22/1941 ?MRN: [id number] ?PRIMARY CARE PROVIDER:    ?Melody Hawthorne, FNP,  ?[phone] ?[place] [address] ?[phone] ? ?REFERRING PROVIDER:   ?Melody Hawthorne, FNP ?[phone] ?[address] ?[place],  [place] [address] ?[phone] ? ?RESPONSIBLE PARTY:    ?Contact Information   ? ? Name Relation Home Work Mobile  ? [place] Friend   [phone]  ? [place] Spouse [phone]  [phone]  ? ?  ? ? ? ?I met face to face with patient and paid caregiver Melody Ochoa in her assisted living facility. Palliative Care was asked to follow this patient by consultation request of  Arnett, Yvetta Coder, FNP to address advance care planning and complex medical decision making. This is a follow up visit. ? ? ASSESSMENT , SYMPTOM MANAGEMENT AND PLAN / RECOMMENDATIONS:  ?Acute on Chronic respiratory failure with hypoxia due to interstitial lung disease with COPD  ?Improving. ?Continue O2 and titrate to keep O2 sat >92%.  Currently on 3.5 L [place]. ?Continue Prednisone course and Doxycycline. ?Continue Albuterol neb Q 6 hrs prn wheezing/SOB ? ? ? ?Advance Care Planning/Goals of Care: Goals include to maximize quality of life and symptom management. Need to address with patient and [facility] POA-Melody Ochoa. ?CODE STATUS: ?Currently full code ? ? ? ?Follow up Palliative Care Visit: Palliative care will continue to follow for complex medical decision making, advance care planning, and clarification of goals. Return 4 weeks or prn. ? ? ?This visit was coded based on medical decision making (MDM). ? ?PPS: 50% ? ?HOSPICE ELIGIBILITY/DIAGNOSIS: TBD ? ?Chief Complaint:  ?Palliative Care is folllowing up recent ED visit for COPD exacerbation with possible  pneumonia. ? ?HISTORY OF PRESENT ILLNESS:  Melody Ochoa is a 81 y.o. year old female with chronic respiratory failure due to interstitial lung disease, with hx of stroke and hip replacement.  She also has physical debility, memory deficit, chronic SI joint pain, depression in remission, HLD, chronic low back pain, CKD, spinal stenosis and generalized OA. Currently feeling better, less dyspnea and on O2 _0 .5 L [place].  She continues to have some desaturation to 80s with activity.  She was seen in the ER, had mildly elevated BNP 384.8.  CXR 04/14/22 shows continued interstitial fibrosis with slight increase in right peripheral lung with recommendation to follow up with non-contrast chest CT. WBC is normal.  BMP with essentially mildly decreased eGFR. ? ?History obtained from review of EMR, discussion with paid caregiver and/or Melody Ochoa.  ?I reviewed available labs, medications, imaging, studies and related documents from the EMR.  Records reviewed and summarized above.  ? ?ROS ? ?General: Endorses feeling "much better" ?EYES: denies vision changes ?ENMT: denies dysphagia ?Cardiovascular: denies chest pain, endorses DOE improved from prior ?Pulmonary: denies cough, endorses improved SOB ?Abdomen: endorses good appetite, denies constipation, endorses continence of bowel ?GU: denies dysuria, incontinence of urine ?MSK:  reports decreased weakness with improved dyspnea,  no recent falls reported.   ?Skin: denies rashes or wounds ?Neurological: pain in right shoulder  ?Psych: Endorses positive mood ?Heme/lymph/immuno: denies bruises, abnormal bleeding ? ?Physical Exam: ?Current and past weights: 162 lbs as of 02/11/22, no new weight obtained ?Constitutional: NAD ?General: WN, WD sitting up in chair ?EYES: anicteric sclera, lids intact, no  discharge  ?ENMT: intact hearing, oral mucous membranes moist, dentition intact ?CV: S1S2, RRR with LUSB murmur,  trace BLE edema ?Pulmonary: Expiratory wheeze left lung and right lung  base, no increased work of breathing, on 3.5L O2 ?Abdomen: normo-active BS + 4 quadrants, soft and non tender, no ascites ?GU: deferred ?MSK: no sarcopenia, moves all extremities, ambulatory for short distances, primarily uses WC for mobility.  ?Skin: warm and dry, no rashes or wounds on visible skin ?Neuro:  no generalized weakness,  mild cognitive impairment ?Psych: non-anxious affect, A and O x 3 ?Hem/lymph/immuno: no widespread bruising ? ? ?Thank you for the opportunity to participate in the care of Melody Ochoa.  The palliative care team will continue to follow. Please call our office at [phone] if we can be of additional assistance.  ? ?Melody Conception, FNP -C ? ? ?COVID-19 PATIENT SCREENING TOOL ?Asked and negative response unless otherwise noted:  ? ?Have you had symptoms of covid, tested positive or been in contact with someone with symptoms/positive test in the past 5-10 days? No ? ?

## 2022-04-18 ENCOUNTER — Encounter: Payer: Self-pay | Admitting: Family Medicine

## 2022-04-18 DIAGNOSIS — R0902 Hypoxemia: Secondary | ICD-10-CM | POA: Diagnosis not present

## 2022-04-18 DIAGNOSIS — J441 Chronic obstructive pulmonary disease with (acute) exacerbation: Secondary | ICD-10-CM | POA: Insufficient documentation

## 2022-04-19 DIAGNOSIS — R278 Other lack of coordination: Secondary | ICD-10-CM | POA: Diagnosis not present

## 2022-04-19 DIAGNOSIS — R279 Unspecified lack of coordination: Secondary | ICD-10-CM | POA: Diagnosis not present

## 2022-04-19 DIAGNOSIS — I639 Cerebral infarction, unspecified: Secondary | ICD-10-CM | POA: Diagnosis not present

## 2022-04-19 DIAGNOSIS — M6281 Muscle weakness (generalized): Secondary | ICD-10-CM | POA: Diagnosis not present

## 2022-04-19 DIAGNOSIS — R296 Repeated falls: Secondary | ICD-10-CM | POA: Diagnosis not present

## 2022-04-19 DIAGNOSIS — R2689 Other abnormalities of gait and mobility: Secondary | ICD-10-CM | POA: Diagnosis not present

## 2022-04-20 DIAGNOSIS — R296 Repeated falls: Secondary | ICD-10-CM | POA: Diagnosis not present

## 2022-04-20 DIAGNOSIS — F0154 Vascular dementia, unspecified severity, with anxiety: Secondary | ICD-10-CM | POA: Diagnosis not present

## 2022-04-20 DIAGNOSIS — J8410 Pulmonary fibrosis, unspecified: Secondary | ICD-10-CM | POA: Diagnosis not present

## 2022-04-20 DIAGNOSIS — R278 Other lack of coordination: Secondary | ICD-10-CM | POA: Diagnosis not present

## 2022-04-20 DIAGNOSIS — M6281 Muscle weakness (generalized): Secondary | ICD-10-CM | POA: Diagnosis not present

## 2022-04-20 DIAGNOSIS — R059 Cough, unspecified: Secondary | ICD-10-CM | POA: Diagnosis not present

## 2022-04-20 DIAGNOSIS — R2689 Other abnormalities of gait and mobility: Secondary | ICD-10-CM | POA: Diagnosis not present

## 2022-04-20 DIAGNOSIS — J9620 Acute and chronic respiratory failure, unspecified whether with hypoxia or hypercapnia: Secondary | ICD-10-CM | POA: Diagnosis not present

## 2022-04-20 DIAGNOSIS — I639 Cerebral infarction, unspecified: Secondary | ICD-10-CM | POA: Diagnosis not present

## 2022-04-20 DIAGNOSIS — R279 Unspecified lack of coordination: Secondary | ICD-10-CM | POA: Diagnosis not present

## 2022-04-21 ENCOUNTER — Inpatient Hospital Stay: Payer: Medicare Other

## 2022-04-21 ENCOUNTER — Telehealth: Payer: Self-pay

## 2022-04-21 DIAGNOSIS — R278 Other lack of coordination: Secondary | ICD-10-CM | POA: Diagnosis not present

## 2022-04-21 DIAGNOSIS — M6281 Muscle weakness (generalized): Secondary | ICD-10-CM | POA: Diagnosis not present

## 2022-04-21 DIAGNOSIS — R296 Repeated falls: Secondary | ICD-10-CM | POA: Diagnosis not present

## 2022-04-21 DIAGNOSIS — I639 Cerebral infarction, unspecified: Secondary | ICD-10-CM | POA: Diagnosis not present

## 2022-04-21 NOTE — Telephone Encounter (Signed)
noted 

## 2022-04-21 NOTE — Telephone Encounter (Signed)
Melody Ochoa, patient's Healthcare POA, called to state patient is moving to an assisted living facility, [facility].  Rod Holler states patient will be seeing the doctor for the facility, so we need to cancel her visit on 04/26/2022 with [name], NP. ?

## 2022-04-26 ENCOUNTER — Ambulatory Visit: Payer: Medicare Other | Admitting: Family

## 2022-04-26 DIAGNOSIS — R278 Other lack of coordination: Secondary | ICD-10-CM | POA: Diagnosis not present

## 2022-04-26 DIAGNOSIS — M6281 Muscle weakness (generalized): Secondary | ICD-10-CM | POA: Diagnosis not present

## 2022-04-26 DIAGNOSIS — R296 Repeated falls: Secondary | ICD-10-CM | POA: Diagnosis not present

## 2022-04-26 DIAGNOSIS — I639 Cerebral infarction, unspecified: Secondary | ICD-10-CM | POA: Diagnosis not present

## 2022-04-27 DIAGNOSIS — R296 Repeated falls: Secondary | ICD-10-CM | POA: Diagnosis not present

## 2022-04-27 DIAGNOSIS — M6281 Muscle weakness (generalized): Secondary | ICD-10-CM | POA: Diagnosis not present

## 2022-04-27 DIAGNOSIS — R279 Unspecified lack of coordination: Secondary | ICD-10-CM | POA: Diagnosis not present

## 2022-04-27 DIAGNOSIS — R2689 Other abnormalities of gait and mobility: Secondary | ICD-10-CM | POA: Diagnosis not present

## 2022-04-28 DIAGNOSIS — M6281 Muscle weakness (generalized): Secondary | ICD-10-CM | POA: Diagnosis not present

## 2022-04-28 DIAGNOSIS — R278 Other lack of coordination: Secondary | ICD-10-CM | POA: Diagnosis not present

## 2022-04-28 DIAGNOSIS — I639 Cerebral infarction, unspecified: Secondary | ICD-10-CM | POA: Diagnosis not present

## 2022-04-28 DIAGNOSIS — R296 Repeated falls: Secondary | ICD-10-CM | POA: Diagnosis not present

## 2022-04-29 DIAGNOSIS — R279 Unspecified lack of coordination: Secondary | ICD-10-CM | POA: Diagnosis not present

## 2022-04-29 DIAGNOSIS — R296 Repeated falls: Secondary | ICD-10-CM | POA: Diagnosis not present

## 2022-04-29 DIAGNOSIS — R2689 Other abnormalities of gait and mobility: Secondary | ICD-10-CM | POA: Diagnosis not present

## 2022-04-29 DIAGNOSIS — M6281 Muscle weakness (generalized): Secondary | ICD-10-CM | POA: Diagnosis not present

## 2022-04-29 DIAGNOSIS — R278 Other lack of coordination: Secondary | ICD-10-CM | POA: Diagnosis not present

## 2022-04-29 DIAGNOSIS — I639 Cerebral infarction, unspecified: Secondary | ICD-10-CM | POA: Diagnosis not present

## 2022-04-30 DIAGNOSIS — M6281 Muscle weakness (generalized): Secondary | ICD-10-CM | POA: Diagnosis not present

## 2022-04-30 DIAGNOSIS — R2689 Other abnormalities of gait and mobility: Secondary | ICD-10-CM | POA: Diagnosis not present

## 2022-04-30 DIAGNOSIS — R296 Repeated falls: Secondary | ICD-10-CM | POA: Diagnosis not present

## 2022-04-30 DIAGNOSIS — R279 Unspecified lack of coordination: Secondary | ICD-10-CM | POA: Diagnosis not present

## 2022-05-03 ENCOUNTER — Emergency Department (HOSPITAL_COMMUNITY)

## 2022-05-03 ENCOUNTER — Other Ambulatory Visit: Payer: Self-pay

## 2022-05-03 ENCOUNTER — Encounter (HOSPITAL_COMMUNITY): Payer: Self-pay | Admitting: Emergency Medicine

## 2022-05-03 ENCOUNTER — Inpatient Hospital Stay (HOSPITAL_COMMUNITY)
Admission: EM | Admit: 2022-05-03 | Discharge: 2022-05-06 | DRG: 951 | Disposition: E | Source: Skilled Nursing Facility | Attending: Family Medicine | Admitting: Family Medicine

## 2022-05-03 DIAGNOSIS — R918 Other nonspecific abnormal finding of lung field: Secondary | ICD-10-CM | POA: Diagnosis not present

## 2022-05-03 DIAGNOSIS — Z85828 Personal history of other malignant neoplasm of skin: Secondary | ICD-10-CM | POA: Diagnosis not present

## 2022-05-03 DIAGNOSIS — Z9842 Cataract extraction status, left eye: Secondary | ICD-10-CM

## 2022-05-03 DIAGNOSIS — Z79899 Other long term (current) drug therapy: Secondary | ICD-10-CM

## 2022-05-03 DIAGNOSIS — J849 Interstitial pulmonary disease, unspecified: Secondary | ICD-10-CM

## 2022-05-03 DIAGNOSIS — K219 Gastro-esophageal reflux disease without esophagitis: Secondary | ICD-10-CM | POA: Diagnosis not present

## 2022-05-03 DIAGNOSIS — J8410 Pulmonary fibrosis, unspecified: Secondary | ICD-10-CM | POA: Diagnosis present

## 2022-05-03 DIAGNOSIS — Z888 Allergy status to other drugs, medicaments and biological substances status: Secondary | ICD-10-CM | POA: Diagnosis not present

## 2022-05-03 DIAGNOSIS — Z515 Encounter for palliative care: Secondary | ICD-10-CM | POA: Diagnosis not present

## 2022-05-03 DIAGNOSIS — Z7902 Long term (current) use of antithrombotics/antiplatelets: Secondary | ICD-10-CM

## 2022-05-03 DIAGNOSIS — Z801 Family history of malignant neoplasm of trachea, bronchus and lung: Secondary | ICD-10-CM

## 2022-05-03 DIAGNOSIS — J439 Emphysema, unspecified: Secondary | ICD-10-CM | POA: Diagnosis not present

## 2022-05-03 DIAGNOSIS — Z823 Family history of stroke: Secondary | ICD-10-CM

## 2022-05-03 DIAGNOSIS — R4189 Other symptoms and signs involving cognitive functions and awareness: Secondary | ICD-10-CM

## 2022-05-03 DIAGNOSIS — Z87891 Personal history of nicotine dependence: Secondary | ICD-10-CM

## 2022-05-03 DIAGNOSIS — Z8673 Personal history of transient ischemic attack (TIA), and cerebral infarction without residual deficits: Secondary | ICD-10-CM | POA: Diagnosis not present

## 2022-05-03 DIAGNOSIS — Z803 Family history of malignant neoplasm of breast: Secondary | ICD-10-CM

## 2022-05-03 DIAGNOSIS — Z743 Need for continuous supervision: Secondary | ICD-10-CM | POA: Diagnosis not present

## 2022-05-03 DIAGNOSIS — R069 Unspecified abnormalities of breathing: Secondary | ICD-10-CM | POA: Diagnosis not present

## 2022-05-03 DIAGNOSIS — Z91040 Latex allergy status: Secondary | ICD-10-CM

## 2022-05-03 DIAGNOSIS — R531 Weakness: Secondary | ICD-10-CM | POA: Diagnosis not present

## 2022-05-03 DIAGNOSIS — J9601 Acute respiratory failure with hypoxia: Secondary | ICD-10-CM

## 2022-05-03 DIAGNOSIS — Z961 Presence of intraocular lens: Secondary | ICD-10-CM | POA: Diagnosis not present

## 2022-05-03 DIAGNOSIS — Z66 Do not resuscitate: Secondary | ICD-10-CM | POA: Diagnosis not present

## 2022-05-03 DIAGNOSIS — I700 Atherosclerosis of aorta: Secondary | ICD-10-CM | POA: Diagnosis not present

## 2022-05-03 DIAGNOSIS — R54 Age-related physical debility: Secondary | ICD-10-CM | POA: Diagnosis present

## 2022-05-03 DIAGNOSIS — E785 Hyperlipidemia, unspecified: Secondary | ICD-10-CM | POA: Diagnosis not present

## 2022-05-03 DIAGNOSIS — J440 Chronic obstructive pulmonary disease with acute lower respiratory infection: Secondary | ICD-10-CM | POA: Diagnosis not present

## 2022-05-03 DIAGNOSIS — J90 Pleural effusion, not elsewhere classified: Secondary | ICD-10-CM | POA: Diagnosis not present

## 2022-05-03 DIAGNOSIS — J9621 Acute and chronic respiratory failure with hypoxia: Secondary | ICD-10-CM | POA: Diagnosis not present

## 2022-05-03 DIAGNOSIS — I517 Cardiomegaly: Secondary | ICD-10-CM | POA: Diagnosis not present

## 2022-05-03 DIAGNOSIS — R0902 Hypoxemia: Secondary | ICD-10-CM | POA: Diagnosis not present

## 2022-05-03 DIAGNOSIS — Z9841 Cataract extraction status, right eye: Secondary | ICD-10-CM

## 2022-05-03 DIAGNOSIS — Z833 Family history of diabetes mellitus: Secondary | ICD-10-CM

## 2022-05-03 DIAGNOSIS — Z96643 Presence of artificial hip joint, bilateral: Secondary | ICD-10-CM | POA: Diagnosis present

## 2022-05-03 DIAGNOSIS — J189 Pneumonia, unspecified organism: Secondary | ICD-10-CM | POA: Diagnosis present

## 2022-05-03 DIAGNOSIS — R6889 Other general symptoms and signs: Secondary | ICD-10-CM | POA: Diagnosis not present

## 2022-05-03 DIAGNOSIS — Z20822 Contact with and (suspected) exposure to covid-19: Secondary | ICD-10-CM | POA: Diagnosis not present

## 2022-05-03 DIAGNOSIS — Z885 Allergy status to narcotic agent status: Secondary | ICD-10-CM

## 2022-05-03 DIAGNOSIS — J441 Chronic obstructive pulmonary disease with (acute) exacerbation: Secondary | ICD-10-CM | POA: Diagnosis present

## 2022-05-03 LAB — CBC WITH DIFFERENTIAL/PLATELET
Abs Immature Granulocytes: 0.04 10*3/uL (ref 0.00–0.07)
Basophils Absolute: 0.1 10*3/uL (ref 0.0–0.1)
Basophils Relative: 1 %
Eosinophils Absolute: 0.3 10*3/uL (ref 0.0–0.5)
Eosinophils Relative: 3 %
HCT: 36.7 % (ref 36.0–46.0)
Hemoglobin: 11.2 g/dL — ABNORMAL LOW (ref 12.0–15.0)
Immature Granulocytes: 0 %
Lymphocytes Relative: 5 %
Lymphs Abs: 0.5 10*3/uL — ABNORMAL LOW (ref 0.7–4.0)
MCH: 26.8 pg (ref 26.0–34.0)
MCHC: 30.5 g/dL (ref 30.0–36.0)
MCV: 87.8 fL (ref 80.0–100.0)
Monocytes Absolute: 0.4 10*3/uL (ref 0.1–1.0)
Monocytes Relative: 5 %
Neutro Abs: 7.9 10*3/uL — ABNORMAL HIGH (ref 1.7–7.7)
Neutrophils Relative %: 86 %
Platelets: 166 10*3/uL (ref 150–400)
RBC: 4.18 MIL/uL (ref 3.87–5.11)
RDW: 15.9 % — ABNORMAL HIGH (ref 11.5–15.5)
WBC: 9.2 10*3/uL (ref 4.0–10.5)
nRBC: 0 % (ref 0.0–0.2)

## 2022-05-03 LAB — BASIC METABOLIC PANEL
Anion gap: 7 (ref 5–15)
BUN: 10 mg/dL (ref 8–23)
CO2: 28 mmol/L (ref 22–32)
Calcium: 8.6 mg/dL — ABNORMAL LOW (ref 8.9–10.3)
Chloride: 106 mmol/L (ref 98–111)
Creatinine, Ser: 1.03 mg/dL — ABNORMAL HIGH (ref 0.44–1.00)
GFR, Estimated: 55 mL/min — ABNORMAL LOW (ref >60)
Glucose, Bld: 144 mg/dL — ABNORMAL HIGH (ref 70–99)
Potassium: 3.5 mmol/L (ref 3.5–5.1)
Sodium: 141 mmol/L (ref 135–145)

## 2022-05-03 LAB — SARS CORONAVIRUS 2 BY RT PCR: SARS Coronavirus 2 by RT PCR: NEGATIVE

## 2022-05-03 LAB — TROPONIN I (HIGH SENSITIVITY)
Troponin I (High Sensitivity): 68 ng/L — ABNORMAL HIGH (ref <18)
Troponin I (High Sensitivity): 100 ng/L (ref <18)

## 2022-05-03 LAB — BRAIN NATRIURETIC PEPTIDE: B Natriuretic Peptide: 168.1 pg/mL — ABNORMAL HIGH (ref 0.0–100.0)

## 2022-05-03 MED ORDER — SODIUM CHLORIDE 0.9 % IV SOLN
500.0000 mg | Freq: Once | INTRAVENOUS | Status: AC
  Administered 2022-05-03: 500 mg via INTRAVENOUS
  Filled 2022-05-03: qty 5

## 2022-05-03 MED ORDER — LORAZEPAM 2 MG/ML IJ SOLN
0.5000 mg | INTRAMUSCULAR | Status: DC | PRN
  Administered 2022-05-03 – 2022-05-04 (×2): 2 mg via INTRAVENOUS
  Filled 2022-05-03 (×3): qty 1

## 2022-05-03 MED ORDER — POLYETHYLENE GLYCOL 3350 17 G PO PACK: 17.0000 g | PACK | Freq: Every day | ORAL | Status: DC | PRN

## 2022-05-03 MED ORDER — ACETAMINOPHEN 650 MG RE SUPP: 650.0000 mg | Freq: Four times a day (QID) | RECTAL | Status: DC | PRN

## 2022-05-03 MED ORDER — ENOXAPARIN SODIUM 40 MG/0.4ML IJ SOSY
40.0000 mg | PREFILLED_SYRINGE | INTRAMUSCULAR | Status: DC
Start: 2022-05-03 — End: 2022-05-03
  Filled 2022-05-03: qty 0.4

## 2022-05-03 MED ORDER — POLYVINYL ALCOHOL 1.4 % OP SOLN
1.0000 [drp] | Freq: Four times a day (QID) | OPHTHALMIC | Status: DC | PRN
Start: 2022-05-03 — End: 2022-05-03

## 2022-05-03 MED ORDER — MORPHINE BOLUS VIA INFUSION
1.0000 mg | INTRAVENOUS | Status: DC | PRN
  Administered 2022-05-03 – 2022-05-05 (×7): 1 mg via INTRAVENOUS

## 2022-05-03 MED ORDER — DOCUSATE SODIUM 100 MG PO CAPS
100.0000 mg | ORAL_CAPSULE | Freq: Two times a day (BID) | ORAL | Status: DC
Start: 2022-05-03 — End: 2022-05-05
  Administered 2022-05-03 – 2022-05-04 (×2): 100 mg via ORAL
  Filled 2022-05-03 (×3): qty 1

## 2022-05-03 MED ORDER — SODIUM CHLORIDE 0.9 % IV SOLN: 500.0000 mg | INTRAVENOUS | Status: DC

## 2022-05-03 MED ORDER — HALOPERIDOL LACTATE 5 MG/ML IJ SOLN: 2.5000 mg | INTRAMUSCULAR | Status: DC | PRN

## 2022-05-03 MED ORDER — GLYCOPYRROLATE 0.2 MG/ML IJ SOLN: 0.2000 mg | INTRAMUSCULAR | Status: DC | PRN

## 2022-05-03 MED ORDER — IOHEXOL 350 MG/ML SOLN
100.0000 mL | Freq: Once | INTRAVENOUS | Status: AC | PRN
  Administered 2022-05-03: 100 mL via INTRAVENOUS

## 2022-05-03 MED ORDER — GUAIFENESIN ER 600 MG PO TB12: 600.0000 mg | ORAL_TABLET | Freq: Two times a day (BID) | ORAL | Status: DC | PRN

## 2022-05-03 MED ORDER — SODIUM CHLORIDE 0.9 % IV SOLN: 1.0000 g | INTRAVENOUS | Status: DC

## 2022-05-03 MED ORDER — MORPHINE 100MG IN NS 100ML (1MG/ML) PREMIX INFUSION
1.0000 mg/h | INTRAVENOUS | Status: DC
  Administered 2022-05-03: 1 mg/h via INTRAVENOUS
  Administered 2022-05-04: 2 mg/h via INTRAVENOUS
  Administered 2022-05-05: 3 mg/h via INTRAVENOUS
  Filled 2022-05-03 (×2): qty 100

## 2022-05-03 MED ORDER — SODIUM CHLORIDE 0.9 % IV SOLN
1.0000 g | Freq: Once | INTRAVENOUS | Status: AC
  Administered 2022-05-03: 1 g via INTRAVENOUS
  Filled 2022-05-03: qty 10

## 2022-05-03 MED ORDER — MORPHINE SULFATE (PF) 2 MG/ML IV SOLN
2.0000 mg | INTRAVENOUS | Status: DC | PRN
  Administered 2022-05-03: 2 mg via INTRAVENOUS
  Filled 2022-05-03: qty 1

## 2022-05-03 MED ORDER — ALBUTEROL SULFATE HFA 108 (90 BASE) MCG/ACT IN AERS
4.0000 | INHALATION_SPRAY | Freq: Once | RESPIRATORY_TRACT | Status: AC
  Administered 2022-05-03: 4 via RESPIRATORY_TRACT
  Filled 2022-05-03: qty 6.7

## 2022-05-03 MED ORDER — BIOTENE DRY MOUTH MT LIQD: 15.0000 mL | OROMUCOSAL | Status: DC | PRN

## 2022-05-03 MED ORDER — POLYVINYL ALCOHOL 1.4 % OP SOLN: 1.0000 [drp] | Freq: Four times a day (QID) | OPHTHALMIC | Status: DC | PRN

## 2022-05-03 MED ORDER — BISACODYL 5 MG PO TBEC: 5.0000 mg | DELAYED_RELEASE_TABLET | Freq: Every day | ORAL | Status: DC | PRN

## 2022-05-03 MED ORDER — LORAZEPAM 2 MG/ML IJ SOLN: 2.0000 mg | INTRAMUSCULAR | Status: DC | PRN

## 2022-05-03 MED ORDER — GLYCOPYRROLATE 1 MG PO TABS: 1.0000 mg | ORAL_TABLET | ORAL | Status: DC | PRN

## 2022-05-03 MED ORDER — LORAZEPAM 2 MG/ML IJ SOLN: 0.5000 mg | INTRAMUSCULAR | Status: DC | PRN

## 2022-05-03 MED ORDER — ONDANSETRON HCL 4 MG/2ML IJ SOLN: 4.0000 mg | Freq: Four times a day (QID) | INTRAMUSCULAR | Status: DC | PRN

## 2022-05-03 MED ORDER — ACETAMINOPHEN 325 MG PO TABS: 650.0000 mg | ORAL_TABLET | Freq: Four times a day (QID) | ORAL | Status: DC | PRN

## 2022-05-03 MED ORDER — ONDANSETRON HCL 4 MG PO TABS: 4.0000 mg | ORAL_TABLET | Freq: Four times a day (QID) | ORAL | Status: DC | PRN

## 2022-05-03 MED ORDER — HYDRALAZINE HCL 20 MG/ML IJ SOLN: 5.0000 mg | INTRAMUSCULAR | Status: DC | PRN

## 2022-05-03 MED ORDER — DIPHENHYDRAMINE HCL 50 MG/ML IJ SOLN
25.0000 mg | INTRAMUSCULAR | Status: DC | PRN
  Administered 2022-05-04 (×3): 25 mg via INTRAVENOUS
  Filled 2022-05-03 (×3): qty 1

## 2022-05-03 MED ORDER — SODIUM CHLORIDE 0.9 % IV SOLN: INTRAVENOUS | Status: DC

## 2022-05-03 MED ORDER — ALBUTEROL SULFATE (2.5 MG/3ML) 0.083% IN NEBU: 2.5000 mg | INHALATION_SOLUTION | RESPIRATORY_TRACT | Status: DC | PRN

## 2022-05-03 NOTE — ED Triage Notes (Signed)
Patient BIB [facility] from [facility]. Per patient [place] fell off at some point last night. Pt SpO2 in 50s upon EMS arrival, placed on [place], SpO2 up to low 80s. Pt placed on NRB via EMS. Pt endorses some exertional shortness of breath over the last week or 2.

## 2022-05-03 NOTE — ED Notes (Signed)
Admit MD at bedside

## 2022-05-03 NOTE — Progress Notes (Signed)
This chaplain responded to the EOL spiritual care consult placed by Eliseo Gum, NP. The chaplain checked in with the Pt. RN-Lauren before the visit. The Pt. is awake and is pleasantly discussing her choices for lunch with her HCPOA-[name], when the chaplain arrives.  The Pt. accepted the chaplain's invitation for storytelling and learning more about the Pt. family. The Pt. introduces the chaplain to her dog-Houdini first. Through reflective listening, the chaplain understands the Pt. is a grandmother and great-grandmother. The Pt. and her husband-Jim have been married for 61 years. The Pt. pauses and swallows as she tells the chaplain, Clair Gulling is in the memory care unit at [facility]. The Pt. candidly speaks of the successes and the challenges in her family's stories.   The Pt. is fond of her hospital room and talks about dying in the context of "now that I know". The Pt. shares her interest in talking to Palliative Care and her desire to remain comfortable along with planning for EOL.  The Pt. and chaplain plan on a second visit on Tuesday.  Chaplain Sallyanne Kuster [phone]

## 2022-05-03 NOTE — Progress Notes (Signed)
Manufacturing engineer [facility]) Hospital Liaison Note  This is a current [facility] hospice patient. We will continue to follow while hospitalized. Please call with any questions or concerns. Thank you  Roselee Nova, [place] Hospital Liaison [phone]

## 2022-05-03 NOTE — ED Provider Notes (Signed)
[facility] EMERGENCY DEPARTMENT Provider Note   CSN: [id number] Arrival date & time: 04/24/2022  0746     History  Chief Complaint  Patient presents with   Shortness of Breath    Melody Ochoa is a 81 y.o. female.  The history is provided by the patient.  Shortness of Breath Severity:  Mild Onset quality:  Gradual Duration:  2 weeks Timing:  Intermittent Progression:  Waxing and waning Chronicity:  Recurrent Context: not URI   Context comment:  History of pulmonary fibrosis on 3 to 4 L of oxygen.  Slightly worse shortness of breath worse overnight when her oxygen fell off.  Feels better now that her oxygen is back on.  No chest pain. Relieved by:  Oxygen Worsened by:  Nothing Associated symptoms: no abdominal pain, no chest pain, no claudication, no cough, no diaphoresis, no ear pain, no fever, no headaches, no hemoptysis, no neck pain, no PND, no rash, no sore throat, no sputum production, no syncope, no swollen glands, no vomiting and no wheezing       Home Medications Prior to Admission medications   Medication Sig Start Date End Date Taking? Authorizing Provider  ACETAMINOPHEN EXTRA STRENGTH 500 MG tablet TAKE 2 TABS ('1000MG'$ ) BY MOUTH EVERY 6 HOURS AS NEEDED FOR PAIN 01/26/22   Burnard Hawthorne, FNP  albuterol (VENTOLIN HFA) 108 (90 Base) MCG/ACT inhaler INHALE 2 PUFFS BY MOUTH EVERY 6 HOURS AS NEEDED FOR SHORTNESS OF BREATH / WHEEZE 01/26/22   Burnard Hawthorne, FNP  atorvastatin (LIPITOR) 80 MG tablet TAKE ONE TABLET BY MOUTH EVERY DAY 12/08/21   Burnard Hawthorne, FNP  buPROPion (WELLBUTRIN XL) 300 MG 24 hr tablet TAKE 1 TABLET BY MOUTH DAILY 12/08/21   Burnard Hawthorne, FNP  clopidogrel (PLAVIX) 75 MG tablet TAKE ONE TABLET BY MOUTH EVERY DAY 01/12/22   Burnard Hawthorne, FNP  docusate sodium (COLACE) 100 MG capsule Take 100 mg by mouth 2 (two) times daily.    [provider]  doxycycline (VIBRAMYCIN) 100 MG capsule Take 1 capsule (100 mg  total) by mouth 2 (two) times daily. 04/14/22   Horton, Alvin Critchley, DO  DULoxetine (CYMBALTA) 30 MG capsule TAKE 1 CAPSULE BY MOUTH ONCE DAILY 12/08/21   Burnard Hawthorne, FNP  famotidine (PEPCID) 20 MG tablet TAKE 1 TAB BY MOUTH AT BEDTIME 02/09/22   Dutch Quint B, FNP  feeding supplement (ENSURE ENLIVE / ENSURE PLUS) LIQD Take 237 mLs by mouth 3 (three) times daily between meals. 08/07/21   Loletha Grayer, MD  ferrous sulfate 325 (65 FE) MG tablet TAKE 1 TAB BY MOUTH EVERY DAY 02/17/22   Dutch Quint B, FNP  fluticasone [facility]) 50 MCG/ACT nasal spray SPRAY 2 SPRAYS INTO EACH NOSTRIL EVERY DAY 01/22/22   Burnard Hawthorne, FNP  guaiFENesin (MUCINEX) 600 MG 12 hr tablet Take 2 tablets (1,200 mg total) by mouth 2 (two) times daily as needed. For back pain 01/22/22   Burnard Hawthorne, FNP  ipratropium-albuterol (DUONEB) 0.5-2.5 (3) MG/3ML SOLN Take 3 mLs by nebulization every 6 (six) hours as needed. 01/22/22   Burnard Hawthorne, FNP  methylPREDNISolone (MEDROL DOSEPAK) 4 MG TBPK tablet Take as directed 04/14/22   Horton, Alvin Critchley, DO  pregabalin (LYRICA) 50 MG capsule TAKE 1 CAPSULE (50 MG) BY MOUTH EVERY DAY AND TAKE 2 CAPSULES (100 MG) AT BEDTIME. Max daily dose '150mg'$ /day. 01/12/22   Burnard Hawthorne, FNP      Allergies  Latex, Aleve [naproxen sodium], Aspirin, Celebrex [celecoxib], Effexor [venlafaxine], Gabapentin, Hydrocodone bit-homatrop mbr, Ibuprofen, Mobic [meloxicam], Tape, Vioxx [rofecoxib], Other, and Tramadol    Review of Systems   Review of Systems  Constitutional:  Negative for diaphoresis and fever.  HENT:  Negative for ear pain and sore throat.   Respiratory:  Positive for shortness of breath. Negative for cough, hemoptysis, sputum production and wheezing.   Cardiovascular:  Negative for chest pain, claudication, syncope and PND.  Gastrointestinal:  Negative for abdominal pain and vomiting.  Musculoskeletal:  Negative for neck pain.  Skin:  Negative for rash.  Neurological:   Negative for headaches.   Physical Exam Updated Vital Signs BP (!) 154/82   Pulse 89   Temp 97.8 F (36.6 C) (Oral)   Resp 17   SpO2 91%  Physical Exam Vitals and nursing note reviewed.  Constitutional:      General: She is not in acute distress.    Appearance: She is well-developed.  HENT:     Head: Normocephalic and atraumatic.     Mouth/Throat:     Mouth: Mucous membranes are moist.  Eyes:     Conjunctiva/sclera: Conjunctivae normal.     Pupils: Pupils are equal, round, and reactive to light.  Cardiovascular:     Rate and Rhythm: Normal rate and regular rhythm.     Pulses: Normal pulses.     Heart sounds: Normal heart sounds. No murmur heard. Pulmonary:     Effort: Pulmonary effort is normal. No respiratory distress.     Breath sounds: Normal breath sounds.  Abdominal:     Palpations: Abdomen is soft.     Tenderness: There is no abdominal tenderness.  Musculoskeletal:        General: No swelling.     Cervical back: Normal range of motion and neck supple.  Skin:    General: Skin is warm and dry.     Capillary Refill: Capillary refill takes less than 2 seconds.  Neurological:     General: No focal deficit present.     Mental Status: She is alert.  Psychiatric:        Mood and Affect: Mood normal.    ED Results / Procedures / Treatments   Labs (all labs ordered are listed, but only abnormal results are displayed) Labs Reviewed  CBC WITH DIFFERENTIAL/PLATELET - Abnormal; Notable for the following components:      Result Value   Hemoglobin 11.2 (*)    RDW 15.9 (*)    Neutro Abs 7.9 (*)    Lymphs Abs 0.5 (*)    All other components within normal limits  BASIC METABOLIC PANEL - Abnormal; Notable for the following components:   Glucose, Bld 144 (*)    Creatinine, Ser 1.03 (*)    Calcium 8.6 (*)    GFR, Estimated 55 (*)    All other components within normal limits  BRAIN NATRIURETIC PEPTIDE - Abnormal; Notable for the following components:   B Natriuretic  Peptide 168.1 (*)    All other components within normal limits  TROPONIN I (HIGH SENSITIVITY) - Abnormal; Notable for the following components:   Troponin I (High Sensitivity) 68 (*)    All other components within normal limits  TROPONIN I (HIGH SENSITIVITY)    EKG EKG Interpretation  Date/Time:  Monday May 03 2022 08:15:12 EDT Ventricular Rate:  96 PR Interval:  188 QRS Duration: 107 QT Interval:  421 QTC Calculation: 533 R Axis:   209 Text Interpretation: Sinus rhythm Probable  left atrial enlargement Confirmed by Lennice Sites [phone]) on 04/29/2022 8:19:41 AM  Radiology CT Angio Chest PE W and/or Wo Contrast  Result Date: 04/09/2022 CLINICAL DATA:  Pulmonary embolism suspected, high/probability. Rule out pneumonia. EXAM: CT ANGIOGRAPHY CHEST WITH CONTRAST TECHNIQUE: Multidetector CT imaging of the chest was performed using the standard protocol during bolus administration of intravenous contrast. Multiplanar CT image reconstructions and MIPs were obtained to evaluate the vascular anatomy. RADIATION DOSE REDUCTION: This exam was performed according to the departmental dose-optimization program which includes automated exposure control, adjustment of the mA and/or kV according to patient size and/or use of iterative reconstruction technique. CONTRAST:  163m OMNIPAQUE IOHEXOL 350 MG/ML SOLN COMPARISON:  CT examination dated October 03, 2021 FINDINGS: Cardiovascular: Satisfactory opacification of the pulmonary arteries to the segmental level. No evidence of pulmonary embolism. Normal heart size. No pericardial effusion. Coronary artery atherosclerotic calcifications. Atherosclerotic calcification of aorta and branch vessels. Mediastinum/Nodes: No enlarged mediastinal, hilar, or axillary lymph nodes. Thyroid gland, trachea, and esophagus demonstrate no significant findings. Lungs/Pleura: Advanced fibrotic changes of bilateral lungs. Interval development of large consolidation in the anterior  aspect of the right upper lobe as well as small other opacities in the right lower lobe. Small right pleural effusion. Upper Abdomen: No acute abnormality.  Pancreatic calcifications. Musculoskeletal: Osteopenia and mild thoracic kyphosis. Degenerative disc disease at multiple levels. Review of the MIP images confirms the above findings. IMPRESSION: 1. Multiple opacities in the right lung, the larger in the upper lobe measuring at least 3.2 x 4.5 cm concerning for multifocal pneumonia in the background of chronic advanced fibrocystic changes. Small right pleural effusion, likely reactive. 2.  No evidence of pulmonary embolism. Aortic Atherosclerosis (ICD10-I70.0) and Emphysema (ICD10-J43.9). Electronically Signed   By: IKeane PoliceD.O.   On: 04/23/2022 09:59   DG Chest Portable 1 View  Result Date: 04/16/2022 CLINICAL DATA:  81year old female with history of shortness of breath. EXAM: PORTABLE CHEST 1 VIEW COMPARISON:  Chest x-ray 04/14/2022. FINDINGS: Lung volumes are very low. Coarse interstitial markings are again noted throughout the lungs bilaterally, similar to prior studies reflective of chronic interstitial lung disease. Increasing nodular and mass-like opacities in the right mid to lower lung. No definite pleural effusions. Chronic elevation of the right hemidiaphragm is unchanged. No pneumothorax. No evidence of pulmonary edema. Heart size is borderline enlarged. Prominence of right paratracheal soft tissues in the upper mediastinum. Atherosclerotic calcifications in the thoracic aorta. Electronic device projecting over the lower left hemithorax, presumably an implantable loop recorder. Orthopedic fixation hardware in the cervical spine and lumbar spine incompletely imaged. IMPRESSION: 1. Increasing nodular and mass-like opacities in the right mid to lower lung. Whether this reflects progressive malignancy or an acute infection is uncertain. There is also prominence of right paratracheal soft tissues  which could reflect malignant lymphadenopathy. This could be better evaluated with contrast-enhanced chest CT if clinically appropriate. 2. Chronic changes of interstitial lung disease redemonstrated. 3. Chronic elevation of the right hemidiaphragm. 4. Aortic atherosclerosis. Electronically Signed   By: DVinnie LangtonM.D.   On: 05/02/2022 08:21    Procedures Procedures    Medications Ordered in ED Medications  cefTRIAXone (ROCEPHIN) 1 g in sodium chloride 0.9 % 100 mL IVPB (has no administration in time range)  azithromycin (ZITHROMAX) 500 mg in sodium chloride 0.9 % 250 mL IVPB (has no administration in time range)  albuterol (VENTOLIN HFA) 108 (90 Base) MCG/ACT inhaler 4 puff (4 puffs Inhalation Given 04/19/2022 0834)  iohexol (OMNIPAQUE) 350 MG/ML  injection 100 mL (100 mLs Intravenous Contrast Given 04/21/2022 0944)    ED Course/ Medical Decision Making/ A&P                           Medical Decision Making Amount and/or Complexity of Data Reviewed Labs: ordered. Radiology: ordered.  Risk Prescription drug management. Decision regarding hospitalization.   CARLON DAVIDSON is here with shortness of breath.  History of pulmonary fibrosis, high cholesterol, stroke, memory issues.  She is on 2 L of oxygen at baseline sometimes 3 or 4.  Some intermittent/slightly worse shortness of breath the last couple weeks.  Her home oxygen fell off overnight and was more short of breath this morning.  Feeling much better now that oxygen has been on.  She was placed on nonrebreather with EMS here she has been titrated back down to her normal oxygen.  She is minimally symptomatic.  No chest pain.  No cough or sputum production.  May be very mild wheezing but overall clear breath sounds.  Will give albuterol treatment.  Suspicion that her symptoms this morning were secondary to her nasal cannula falling off.  Differential diagnosis however can still include pleural effusion, pneumonia.  I have much less  concern for ACS or heart failure.  Will check CBC, BMP, chest x-ray, troponin, BNP.  EKG per my review and interpretation shows sinus rhythm.  No ischemic changes.  Per chart review she just finished a course of antibiotics for presumed pneumonia several weeks ago.  Does not feel like she got much better since then.  Chest x-ray per radiology report shows increasing opacity/may be mass in the lung.  CT scan of the chest was ordered.  Per my review of CT report there does appear to be may be a multifocal pneumonia.  However I do not have any concern for sepsis as she has no leukocytosis or fever.  However she does have increasing oxygen requirement at rest with 5 L of oxygen.  She has been symptomatic with increasing shortness of breath and intermittent cough at times.  We will admit her on IV antibiotics and pulmonary care.  There is no evidence of blood clot.  Troponin 68 and BNP 168.  Suspect that this is in the setting of infectious process and stress.  We will continue to trend troponin.  Per my further review and interpretation of labs she does not have leukocytosis or anemia or significant electrolyte abnormality.  Will admit for further care.  This chart was dictated using voice recognition software.  Despite best efforts to proofread,  errors can occur which can change the documentation meaning.         Final Clinical Impression(s) / ED Diagnoses Final diagnoses:  Acute respiratory failure with hypoxia [facility])  Multifocal pneumonia    Rx / DC Orders ED Discharge Orders     None         Lennice Sites, DO 04/20/2022 1025

## 2022-05-03 NOTE — ED Notes (Signed)
Rt at bedside

## 2022-05-03 NOTE — H&P (Signed)
History and Physical    Patient: Melody Ochoa:[id number] DOB: 1941-03-24 DOA: 04/07/2022 DOS: the patient was seen and examined on 04/12/2022 PCP: Pcp, No  Patient coming from: ALF/ILF - [facility]; [facility]: Adoptive daughter, Rod Holler [phone]   Chief Complaint: SOB  HPI: Melody Ochoa is a 81 y.o. female with medical history significant of CVA; COPD/pulmonary fibrosis on 2-3L home O2 and on hospice; and HLD presenting with SOB.  She reports that she has had several days of heavy breathing and fatigue.  She did not fall.  Her O2 came off during the night, that has been a recent problem.  She gets SOB with any exertion, including just talking.  She was treated with antibiotics and steroids recently but she has continued to feel more SOB.  She is enrolled in hospice due to her breathing.  The PCP at [facility] recommended transitioning from palliative to hospice recently for increased support in her facility.  She is continuing to drop sats even with O2 but is losing her O2 at night and they are concerned about this.  She is on 3L at home, 4L with therapy; she is currently on 4-5L.  She has had a productive cough intermittently.  She sees Dr. Patsey Berthold at [facility] and has an appointment in June.  No fever.  Plenty of sick contacts.    ER Course:  Multifocal PNA, failed outpatient treatment.  Usually on 3L, currently on 5L.  Previously treated with doxy.  O2 fell off at facility, more hypoxic than usual.  No sepsis.  Giving IV abx (Rocephin, Azithro), nebs.     Review of Systems: As mentioned in the history of present illness. All other systems reviewed and are negative. Past Medical History:  Diagnosis Date   Arthritis    Atheromatous plaque 10/18/2017   Back pain    Cancer ([place])    skin   Depression    Diverticulosis    Dizziness    patient had episode of dizziness when came in room. hx past no dx   GERD (gastroesophageal reflux disease)    Heart murmur     Hyperlipidemia 10/18/2017   PONV (postoperative nausea and vomiting)    yrs ago   Squamous cell carcinoma of skin 11/16/2016   L chest parasternal   Squamous cell carcinoma of skin 02/05/2020   L cheek inf to lat zygoma   Stroke [facility])    Past Surgical History:  Procedure Laterality Date   BACK SURGERY  13   BREAST BIOPSY Bilateral    cores "years ago"   BREAST EXCISIONAL BIOPSY Left 90's   CATARACT EXTRACTION W/PHACO Left 12/20/2018   Procedure: CATARACT EXTRACTION PHACO AND INTRAOCULAR LENS PLACEMENT ([place]) LEFT TOPICAL;  Surgeon: Leandrew Koyanagi, MD;  Location: [place];  Service: Ophthalmology;  Laterality: Left;   CATARACT EXTRACTION W/PHACO Right 01/24/2019   Procedure: CATARACT EXTRACTION PHACO AND INTRAOCULAR LENS PLACEMENT ([place]) RIGHT;  Surgeon: Leandrew Koyanagi, MD;  Location: [place];  Service: Ophthalmology;  Laterality: Right;   CERVICAL DISC SURGERY  09   COLONOSCOPY WITH PROPOFOL N/A 01/13/2016   Procedure: COLONOSCOPY WITH PROPOFOL;  Surgeon: [name], MD;  Location: [facility] ENDOSCOPY;  Service: Endoscopy;  Laterality: N/A;   COLONOSCOPY WITH PROPOFOL N/A 02/19/2020   Procedure: COLONOSCOPY WITH PROPOFOL;  Surgeon: [name], MD;  Location: [facility] ENDOSCOPY;  Service: Gastroenterology;  Laterality: N/A;   FACELIFT  94   LOOP RECORDER INSERTION N/A 08/23/2017   Procedure: LOOP RECORDER INSERTION;  Surgeon: Thompson Grayer, MD;  Location: [place] CV LAB;  Service: Cardiovascular;  Laterality: N/A;   TEE WITHOUT CARDIOVERSION N/A 08/12/2017   Procedure: TRANSESOPHAGEAL ECHOCARDIOGRAM (TEE);  Surgeon: Sueanne Margarita, MD;  Location: [facility] ENDOSCOPY;  Service: Cardiovascular;  Laterality: N/A;   TONSILLECTOMY     TOTAL HIP ARTHROPLASTY Right 04/22/2016   Procedure: TOTAL HIP ARTHROPLASTY;  Surgeon: Corky Mull, MD;  Location: [facility] ORS;  Service: Orthopedics;  Laterality: Right;   TOTAL HIP ARTHROPLASTY Left 08/05/2021   Procedure: TOTAL HIP  ARTHROPLASTY;  Surgeon: Corky Mull, MD;  Location: [facility] ORS;  Service: Orthopedics;  Laterality: Left;   Social History:  reports that she quit smoking about 25 years ago. Her smoking use included cigarettes. She has a 40.00 pack-year smoking history. She has never used smokeless tobacco. She reports current alcohol use. She reports that she does not use drugs.  Allergies  Allergen Reactions   Latex Itching    Not listed on MAR   Aleve [Naproxen Sodium] Other (See Comments)    Gi upset Reaction not listed on MAR   Aspirin Other (See Comments)    Stomach pain-aggravates diverticulosis Reaction not listed on MAR   Celebrex [Celecoxib] Other (See Comments)    Dizziness Reaction not listed on MAR    Effexor [Venlafaxine] Other (See Comments)    Hot flashes Reaction not listed on MAR   Gabapentin Other (See Comments)    "Night terrors" Reaction not listed on MAR   Hydrocodone Bit-Homatrop Mbr Diarrhea    Reaction not listed on MAR   Ibuprofen Other (See Comments)    Gi upset Reaction not listed on MAR    Mobic [Meloxicam]     Stomach upset Reaction not listed on MAR   Tape Itching and Other (See Comments)    Itchy blisters Reaction not listed on MAR   Vioxx [Rofecoxib] Other (See Comments)    Gi upset Reaction not listed on MAR   Other Rash and Other (See Comments)    Band-aides Reaction not listed on MAR   Tramadol Other (See Comments)    Hallucinations Reaction not listed on MAR    Family History  Problem Relation Age of Onset   Lung cancer Father    Aneurysm Brother    Stroke Brother    Diabetes Maternal Grandmother    Kidney disease Maternal Grandfather    Cushing syndrome Paternal Grandmother    Dementia Paternal Grandfather    Breast cancer Maternal Aunt 52    Prior to Admission medications   Medication Sig Start Date End Date Taking? Authorizing Provider  ACETAMINOPHEN EXTRA STRENGTH 500 MG tablet TAKE 2 TABS ('1000MG'$ ) BY MOUTH EVERY 6 HOURS AS NEEDED  FOR PAIN 01/26/22   Burnard Hawthorne, FNP  albuterol (VENTOLIN HFA) 108 (90 Base) MCG/ACT inhaler INHALE 2 PUFFS BY MOUTH EVERY 6 HOURS AS NEEDED FOR SHORTNESS OF BREATH / WHEEZE 01/26/22   Burnard Hawthorne, FNP  atorvastatin (LIPITOR) 80 MG tablet TAKE ONE TABLET BY MOUTH EVERY DAY 12/08/21   Burnard Hawthorne, FNP  buPROPion (WELLBUTRIN XL) 300 MG 24 hr tablet TAKE 1 TABLET BY MOUTH DAILY 12/08/21   Burnard Hawthorne, FNP  clopidogrel (PLAVIX) 75 MG tablet TAKE ONE TABLET BY MOUTH EVERY DAY 01/12/22   Burnard Hawthorne, FNP  docusate sodium (COLACE) 100 MG capsule Take 100 mg by mouth 2 (two) times daily.    [provider]  doxycycline (VIBRAMYCIN) 100 MG capsule Take 1 capsule (100  mg total) by mouth 2 (two) times daily. 04/14/22   Horton, Alvin Critchley, DO  DULoxetine (CYMBALTA) 30 MG capsule TAKE 1 CAPSULE BY MOUTH ONCE DAILY 12/08/21   Burnard Hawthorne, FNP  famotidine (PEPCID) 20 MG tablet TAKE 1 TAB BY MOUTH AT BEDTIME 02/09/22   Dutch Quint B, FNP  feeding supplement (ENSURE ENLIVE / ENSURE PLUS) LIQD Take 237 mLs by mouth 3 (three) times daily between meals. 08/07/21   Loletha Grayer, MD  ferrous sulfate 325 (65 FE) MG tablet TAKE 1 TAB BY MOUTH EVERY DAY 02/17/22   Dutch Quint B, FNP  fluticasone [facility]) 50 MCG/ACT nasal spray SPRAY 2 SPRAYS INTO EACH NOSTRIL EVERY DAY 01/22/22   Burnard Hawthorne, FNP  guaiFENesin (MUCINEX) 600 MG 12 hr tablet Take 2 tablets (1,200 mg total) by mouth 2 (two) times daily as needed. For back pain 01/22/22   Burnard Hawthorne, FNP  ipratropium-albuterol (DUONEB) 0.5-2.5 (3) MG/3ML SOLN Take 3 mLs by nebulization every 6 (six) hours as needed. 01/22/22   Burnard Hawthorne, FNP  methylPREDNISolone (MEDROL DOSEPAK) 4 MG TBPK tablet Take as directed 04/14/22   Horton, Alvin Critchley, DO  pregabalin (LYRICA) 50 MG capsule TAKE 1 CAPSULE (50 MG) BY MOUTH EVERY DAY AND TAKE 2 CAPSULES (100 MG) AT BEDTIME. Max daily dose '150mg'$ /day. 01/12/22   Burnard Hawthorne, FNP     Physical Exam: Vitals:   04/20/2022 1000 04/21/2022 1030 04/30/2022 1414 04/16/2022 1703  BP: (!) 154/82 (!) 157/86 (!) 147/80 113/71  Pulse: 89 87 87 88  Resp: '17 17 18 16  '$ Temp:   97.7 F (36.5 C) 98.5 F (36.9 C)  TempSrc:   Oral Oral  SpO2: 91% 98% 91% 93%  Weight:   71.5 kg   Height:   '5\' 4"'$  (1.626 m)    General:  Appears calm and comfortable and is in NAD Eyes:  PERRL, EOMI, normal lids, iris ENT:  grossly normal hearing, lips & tongue, mmm Neck:  no LAD, masses or thyromegaly Cardiovascular:  RRR, no m/r/g. No LE edema.  Respiratory:   Diffuse rales L > R.  Mildly increased respiratory effort on 4-5L [place] O2. Abdomen:  soft, NT, ND Skin:  no rash or induration seen on limited exam Musculoskeletal:  grossly normal tone BUE/BLE, good ROM, no bony abnormality Psychiatric:  grossly normal mood and affect, speech fluent and appropriate, AOx3 Neurologic:  CN 2-12 grossly intact, moves all extremities in coordinated fashion   Radiological Exams on Admission: Independently reviewed - see discussion in A/P where applicable  CT Angio Chest PE W and/or Wo Contrast  Result Date: 04/18/2022 CLINICAL DATA:  Pulmonary embolism suspected, high/probability. Rule out pneumonia. EXAM: CT ANGIOGRAPHY CHEST WITH CONTRAST TECHNIQUE: Multidetector CT imaging of the chest was performed using the standard protocol during bolus administration of intravenous contrast. Multiplanar CT image reconstructions and MIPs were obtained to evaluate the vascular anatomy. RADIATION DOSE REDUCTION: This exam was performed according to the departmental dose-optimization program which includes automated exposure control, adjustment of the mA and/or kV according to patient size and/or use of iterative reconstruction technique. CONTRAST:  174m OMNIPAQUE IOHEXOL 350 MG/ML SOLN COMPARISON:  CT examination dated October 03, 2021 FINDINGS: Cardiovascular: Satisfactory opacification of the pulmonary arteries to the segmental  level. No evidence of pulmonary embolism. Normal heart size. No pericardial effusion. Coronary artery atherosclerotic calcifications. Atherosclerotic calcification of aorta and branch vessels. Mediastinum/Nodes: No enlarged mediastinal, hilar, or axillary lymph nodes. Thyroid gland, trachea, and esophagus demonstrate no  significant findings. Lungs/Pleura: Advanced fibrotic changes of bilateral lungs. Interval development of large consolidation in the anterior aspect of the right upper lobe as well as small other opacities in the right lower lobe. Small right pleural effusion. Upper Abdomen: No acute abnormality.  Pancreatic calcifications. Musculoskeletal: Osteopenia and mild thoracic kyphosis. Degenerative disc disease at multiple levels. Review of the MIP images confirms the above findings. IMPRESSION: 1. Multiple opacities in the right lung, the larger in the upper lobe measuring at least 3.2 x 4.5 cm concerning for multifocal pneumonia in the background of chronic advanced fibrocystic changes. Small right pleural effusion, likely reactive. 2.  No evidence of pulmonary embolism. Aortic Atherosclerosis (ICD10-I70.0) and Emphysema (ICD10-J43.9). Electronically Signed   By: Keane Police D.O.   On: 04/07/2022 09:59   DG Chest Portable 1 View  Result Date: 04/05/2022 CLINICAL DATA:  81 year old female with history of shortness of breath. EXAM: PORTABLE CHEST 1 VIEW COMPARISON:  Chest x-ray 04/14/2022. FINDINGS: Lung volumes are very low. Coarse interstitial markings are again noted throughout the lungs bilaterally, similar to prior studies reflective of chronic interstitial lung disease. Increasing nodular and mass-like opacities in the right mid to lower lung. No definite pleural effusions. Chronic elevation of the right hemidiaphragm is unchanged. No pneumothorax. No evidence of pulmonary edema. Heart size is borderline enlarged. Prominence of right paratracheal soft tissues in the upper mediastinum.  Atherosclerotic calcifications in the thoracic aorta. Electronic device projecting over the lower left hemithorax, presumably an implantable loop recorder. Orthopedic fixation hardware in the cervical spine and lumbar spine incompletely imaged. IMPRESSION: 1. Increasing nodular and mass-like opacities in the right mid to lower lung. Whether this reflects progressive malignancy or an acute infection is uncertain. There is also prominence of right paratracheal soft tissues which could reflect malignant lymphadenopathy. This could be better evaluated with contrast-enhanced chest CT if clinically appropriate. 2. Chronic changes of interstitial lung disease redemonstrated. 3. Chronic elevation of the right hemidiaphragm. 4. Aortic atherosclerosis. Electronically Signed   By: Vinnie Langton M.D.   On: 04/14/2022 08:21    EKG: Independently reviewed.  NSR with rate 96; prolonged QTc 533; nonspecific ST changes with no evidence of acute ischemia   Labs on Admission: I have personally reviewed the available labs and imaging studies at the time of the admission.  Pertinent labs:    Glucose 144 BUN 10/Creatinine 1.03/GFR 55 - stable BNP 168.1 - improved HS troponin 68 Normal WBC Hgb 11.2 - stable COVID pending   Assessment and Plan: Principal Problem:   Admission for end of life care Active Problems:   Pulmonary fibrosis (HCC)   Multifocal pneumonia    End-stage COPD/Pulmonary fibrosis -Patient with known end-stage COPD/pulmonology fibrosis, followed by pulm -Chronic O2-dependent respiratory failure, uses 3L at rest and 4L with activity -1-2 days of worsening SOB; patient was profoundly SOB with increased WOB and O2 sats to low 80s with EMS -Ideally, this is an exacerbation since that would be expected to respond to treatment.   -She denies infectious symptoms  -Considered treatment with broad-spectrum antibiotics for multifocal PNA -However, if this is not an acute infection/exacerbation,  then her disease has begun the rapid progression phase and Hospice is reasonable.  -I consulted pulm to discuss [place] with patient and she has transitioned to comfort measures  End of life care -Will admit to [facility] for comfort care and palliative care consult -Patient is likely to be a candidate for [facility] or other residential hospice if she does not suffer  an in-hospital demise -However, her reserve is likely quite low given her age and overall frailty -Comfort care order set utilized -Pain control with morphine drip   *The remainder of the patient's medications for chronic medical conditions were not ordered since she has transitioned to comfort only.    Advance Care Planning:   Code Status: DNR   Consults: Pulmonology; palliative care  DVT Prophylaxis: None  Family Communication: Daughter was present throughout evaluation  Severity of Illness: The appropriate patient status for this patient is INPATIENT. Inpatient status is judged to be reasonable and necessary in order to provide the required intensity of service to ensure the patient's safety. The patient's presenting symptoms, physical exam findings, and initial radiographic and laboratory data in the context of their chronic comorbidities is felt to place them at high risk for further clinical deterioration. Furthermore, it is not anticipated that the patient will be medically stable for discharge from the hospital within 2 midnights of admission.   * I certify that at the point of admission it is my clinical judgment that the patient will require inpatient hospital care spanning beyond 2 midnights from the point of admission due to high intensity of service, high risk for further deterioration and high frequency of surveillance required.*  Author: Karmen Bongo, MD 04/28/2022 6:09 PM  For on call review [email or web address].

## 2022-05-03 NOTE — Consult Note (Signed)
NAME:  Melody Ochoa, MRN:  [id number], DOB:  1941/08/11, LOS: 0 ADMISSION DATE:  04/15/2022, CONSULTATION DATE:  5/59/23 REFERRING MD:  Melody Ochoa, CHIEF COMPLAINT:  SOB   History of Present Illness:  81 yo F with end stage pulmonary fibrosis who who has been receiving outpatient palliative services at an assisted living facility, presented to ED with worse SOB. Imaging in ED with RUL consolidation in addition to known advanced fibrosis.   PCCM consulted in this setting     Pertinent  Medical History  End stage pulmonary fibrosis Hospice patient  Significant Hospital Events: Including procedures, antibiotic start and stop dates in addition to other pertinent events   Admitting for sx management of hospice pt  Interim History / Subjective:  Consulted    Objective   Blood pressure (!) 157/86, pulse 87, temperature 97.8 F (36.6 C), temperature source Oral, resp. rate 17, SpO2 98 %.       No intake or output data in the 24 hours ending 04/26/2022 1252 There were no vitals filed for this visit.  Examination: General: Pleasant elderly F NAD  HENT: NCAT Melody Ochoa in place  Lungs: tachypnea  Cardiovascular: rr cap refill brisk  Abdomen: ndnt Extremities: no acute deformity Neuro: AAOx3 GU: defer  Resolved Hospital Problem list     Assessment & Plan:   End stage pulmonary fibrosis CAP Talked about goals for this admission. Pt is a palli patient who recently started hospice, but did not seem to have a clear understanding of the end-stage of her pulm process, or general hospice/palli philosophy.  We discussed focus on sx management and pt is very agreeable to this. She shares that she is "ready." Asks about abx, I said fine to dc. Spent some time talking about tx the sx not the numbers, discussed meds used in comfort care In talking with pt & Melody Ochoa, it sounds like current facility may not be well equipped for management of EOL care-- pt shares many experiences where she is so SOB she  panics and it takes a very long time to receive assistance.  Not having sx with PNA-- sputum production fever chills etc  P -admit to [facility] for hospice care -DNR -palli consult -sx management.  -O2 adjusted for comfort  -I have ordered for basic comfort care orders-- PRN morphine, PRN ativan etc. Adjust as needed  -dc'd abx after discussion w pt -think will need dispo planning other than returning to current assisted living facility. With her disease process especially, she will have significant respiratory sx-- would be extremely distressing to experience without being able to receive needed sx management in reasonable timeframe  -loves her dog - - please try to organize canine visitor    Best Practice (right click and "Reselect all SmartList Selections" daily)   Per primary   Labs   CBC: Recent Labs  Lab 04/26/2022 0800  WBC 9.2  NEUTROABS 7.9*  HGB 11.2*  HCT 36.7  MCV 87.8  PLT 462    Basic Metabolic Panel: Recent Labs  Lab 04/06/2022 0800  NA 141  K 3.5  CL 106  CO2 28  GLUCOSE 144*  BUN 10  CREATININE 1.03*  CALCIUM 8.6*   GFR: CrCl cannot be calculated (Unknown ideal weight.). Recent Labs  Lab 04/17/2022 0800  WBC 9.2    Liver Function Tests: No results for input(s): AST, ALT, ALKPHOS, BILITOT, PROT, ALBUMIN in the last 168 hours. No results for input(s): LIPASE, AMYLASE in the last 168 hours. No results  for input(s): AMMONIA in the last 168 hours.  ABG No results found for: PHART, PCO2ART, PO2ART, HCO3, TCO2, ACIDBASEDEF, O2SAT   Coagulation Profile: No results for input(s): INR, PROTIME in the last 168 hours.  Cardiac Enzymes: No results for input(s): CKTOTAL, CKMB, CKMBINDEX, TROPONINI in the last 168 hours.  HbA1C: Hgb A1c MFr Bld  Date/Time Value Ref Range Status  12/10/2020 04:59 AM 5.0 4.8 - 5.6 % Final    Comment:    (NOTE) Pre diabetes:          5.7%-6.4%  Diabetes:              >6.4%  Glycemic control for   <7.0% adults with  diabetes   04/22/2020 11:19 AM 5.4 4.6 - 6.5 % Final    Comment:    Glycemic Control Guidelines for People with Diabetes:Non Diabetic:  <6%Goal of Therapy: <7%Additional Action Suggested:  >8%     CBG: No results for input(s): GLUCAP in the last 168 hours.  Review of Systems:   Review of Systems  Constitutional: Negative.   HENT: Negative.    Eyes: Negative.   Respiratory:  Positive for cough, sputum production and shortness of breath.   Cardiovascular: Negative.   Gastrointestinal: Negative.   Genitourinary: Negative.   Musculoskeletal: Negative.   Skin: Negative.   Neurological: Negative.   Endo/Heme/Allergies: Negative.   Psychiatric/Behavioral:  The patient is nervous/anxious.     Past Medical History:  She,  has a past medical history of Arthritis, Atheromatous plaque (10/18/2017), Back pain, Cancer ([place]), Depression, Diverticulosis, Dizziness, GERD (gastroesophageal reflux disease), Heart murmur, Hyperlipidemia (10/18/2017), PONV (postoperative nausea and vomiting), Squamous cell carcinoma of skin (11/16/2016), Squamous cell carcinoma of skin (02/05/2020), and Stroke ([place]).   Surgical History:   Past Surgical History:  Procedure Laterality Date   BACK SURGERY  13   BREAST BIOPSY Bilateral    cores "years ago"   BREAST EXCISIONAL BIOPSY Left 90's   CATARACT EXTRACTION W/PHACO Left 12/20/2018   Procedure: CATARACT EXTRACTION PHACO AND INTRAOCULAR LENS PLACEMENT ([place]) LEFT TOPICAL;  Surgeon: Leandrew Koyanagi, MD;  Location: [place];  Service: Ophthalmology;  Laterality: Left;   CATARACT EXTRACTION W/PHACO Right 01/24/2019   Procedure: CATARACT EXTRACTION PHACO AND INTRAOCULAR LENS PLACEMENT ([place]) RIGHT;  Surgeon: Leandrew Koyanagi, MD;  Location: [place];  Service: Ophthalmology;  Laterality: Right;   CERVICAL DISC SURGERY  09   COLONOSCOPY WITH PROPOFOL N/A 01/13/2016   Procedure: COLONOSCOPY WITH PROPOFOL;  Surgeon: [name], MD;   Location: [facility] ENDOSCOPY;  Service: Endoscopy;  Laterality: N/A;   COLONOSCOPY WITH PROPOFOL N/A 02/19/2020   Procedure: COLONOSCOPY WITH PROPOFOL;  Surgeon: [name], MD;  Location[facility] END[facility]COPY;  Service: Gastroenterology;  Laterality: N/A;   FACELIFT  94   LOOP RECORDER INSERTION N/A 08/23/2017   Procedure: LOOP RECORDER INSERTION;  Surgeon: Thompson Grayer, MD;  Location: [place] CV LAB;  Service: Cardiovascular;  Laterality: N/A;   TEE WITHOUT CARDIOVERSION N/A 08/12/2017   Procedure: TRANSESOPHAGEAL ECHOCARDIOGRAM (TEE);  Surgeon: Sueanne Margarita, MD;  Location: [facility] ENDOSCOPY;  Service: Cardiovascular;  Laterality: N/A;   TONSILLECTOMY     TOTAL HIP ARTHROPLASTY Right 04/22/2016   Procedure: TOTAL HIP ARTHROPLASTY;  Surgeon: Corky Mull, MD;  Location: [facility] ORS;  Service: Orthopedics;  Laterality: Right;   TOTAL HIP ARTHROPLASTY Left 08/05/2021   Procedure: TOTAL HIP ARTHROPLASTY;  Surgeon: Corky Mull, MD;  Location: [facility] ORS;  Service: Orthopedics;  Laterality: Left;     Social  History:   reports that she quit smoking about 25 years ago. Her smoking use included cigarettes. She has a 40.00 pack-year smoking history. She has never used smokeless tobacco. She reports current alcohol use. She reports that she does not use drugs.   Family History:  Her family history includes Aneurysm in her brother; Breast cancer (age of onset: 18) in her maternal aunt; Cushing syndrome in her paternal grandmother; Dementia in her paternal grandfather; Diabetes in her maternal grandmother; Kidney disease in her maternal grandfather; Lung cancer in her father; Stroke in her brother.   Allergies Allergies  Allergen Reactions   Latex Itching    Not listed on MAR   Aleve [Naproxen Sodium] Other (See Comments)    Gi upset Reaction not listed on MAR   Aspirin Other (See Comments)    Stomach pain-aggravates diverticulosis Reaction not listed on MAR   Celebrex [Celecoxib] Other (See Comments)     Dizziness Reaction not listed on MAR    Effexor [Venlafaxine] Other (See Comments)    Hot flashes Reaction not listed on MAR   Gabapentin Other (See Comments)    "Night terrors" Reaction not listed on MAR   Hydrocodone Bit-Homatrop Mbr Diarrhea    Reaction not listed on MAR   Ibuprofen Other (See Comments)    Gi upset Reaction not listed on MAR    Mobic [Meloxicam]     Stomach upset Reaction not listed on MAR   Tape Itching and Other (See Comments)    Itchy blisters Reaction not listed on MAR   Vioxx [Rofecoxib] Other (See Comments)    Gi upset Reaction not listed on MAR   Other Rash and Other (See Comments)    Band-aides Reaction not listed on MAR   Tramadol Other (See Comments)    Hallucinations Reaction not listed on MAR     Home Medications  Prior to Admission medications   Medication Sig Start Date End Date Taking? Authorizing Provider  atorvastatin (LIPITOR) 80 MG tablet TAKE ONE TABLET BY MOUTH EVERY DAY Patient taking differently: Take 80 mg by mouth daily. 12/08/21  Yes Burnard Hawthorne, FNP  buPROPion (WELLBUTRIN XL) 300 MG 24 hr tablet TAKE 1 TABLET BY MOUTH DAILY Patient taking differently: Take 300 mg by mouth at bedtime. 12/08/21  Yes Arnett, Yvetta Coder, FNP  donepezil (ARICEPT) 5 MG tablet Take 5 mg by mouth daily. 04/20/22  Yes [provider]  DULoxetine (CYMBALTA) 30 MG capsule TAKE 1 CAPSULE BY MOUTH ONCE DAILY Patient taking differently: Take 30 mg by mouth at bedtime. 12/08/21  Yes Arnett, Yvetta Coder, FNP  ferrous sulfate 325 (65 FE) MG tablet TAKE 1 TAB BY MOUTH EVERY DAY Patient taking differently: Take 325 mg by mouth daily. 02/17/22  Yes Dutch Quint B, FNP  fluticasone (FLONASE) 50 MCG/ACT nasal spray SPRAY 2 SPRAYS INTO EACH NOSTRIL EVERY DAY Patient taking differently: Place 2 sprays into both nostrils daily. 01/22/22  Yes Arnett, Yvetta Coder, FNP  methylPREDNISolone (MEDROL DOSEPAK) 4 MG TBPK tablet Take as directed Patient taking  differently: Take 4 mg by mouth daily. 04/14/22  Yes Horton, Kristie M, DO  pregabalin (LYRICA) 50 MG capsule TAKE 1 CAPSULE (50 MG) BY MOUTH EVERY DAY AND TAKE 2 CAPSULES (100 MG) AT BEDTIME. Max daily dose '150mg'$ /day. Patient taking differently: Take 50 mg by mouth See admin instructions. Give 50 mg by mouth daily for pain. Give 100 mg by mouth at bedtime for pain. 01/12/22  Yes Arnett, Yvetta Coder, [place]  EXTRA STRENGTH 500 MG tablet TAKE 2 TABS ('1000MG'$ ) BY MOUTH EVERY 6 HOURS AS NEEDED FOR PAIN 01/26/22   Burnard Hawthorne, FNP  albuterol (VENTOLIN HFA) 108 (90 Base) MCG/ACT inhaler INHALE 2 PUFFS BY MOUTH EVERY 6 HOURS AS NEEDED FOR SHORTNESS OF BREATH / WHEEZE 01/26/22   Burnard Hawthorne, FNP  clopidogrel (PLAVIX) 75 MG tablet TAKE ONE TABLET BY MOUTH EVERY DAY 01/12/22   Burnard Hawthorne, FNP  docusate sodium (COLACE) 100 MG capsule Take 100 mg by mouth 2 (two) times daily.    [provider]  doxycycline (VIBRAMYCIN) 100 MG capsule Take 1 capsule (100 mg total) by mouth 2 (two) times daily. 04/14/22   Horton, Alvin Critchley, DO  famotidine (PEPCID) 20 MG tablet TAKE 1 TAB BY MOUTH AT BEDTIME Patient not taking: Reported on 05/02/2022 02/09/22   Kennyth Arnold, FNP  feeding supplement (ENSURE ENLIVE / ENSURE PLUS) LIQD Take 237 mLs by mouth 3 (three) times daily between meals. 08/07/21   Loletha Grayer, MD  guaiFENesin (MUCINEX) 600 MG 12 hr tablet Take 2 tablets (1,200 mg total) by mouth 2 (two) times daily as needed. For back pain 01/22/22   Burnard Hawthorne, FNP  ipratropium-albuterol (DUONEB) 0.5-2.5 (3) MG/3ML SOLN Take 3 mLs by nebulization every 6 (six) hours as needed. 01/22/22   Burnard Hawthorne, [place] MSN, AGACNP-BC [place] for pager  04/19/2022, 2:16 PM

## 2022-05-03 NOTE — ED Notes (Signed)
Patient transported to CT 

## 2022-05-03 NOTE — Consult Note (Signed)
Consultation Note Date: 04/10/2022   Patient Name: Melody Ochoa  DOB: [birth year]  MRN: [id number]  Age / Sex: 81 y.o., female  PCP: Pcp, No Referring Physician: Karmen Bongo, MD  Reason for Consultation: Hospice Evaluation and Terminal Care  HPI/Patient Profile: 81 y.o. female  with past medical history of advanced pulmonary fibrosis admitted on 04/16/2022 with worsening shortness of breath.   Patient has transitioned to comfort care after discussion with PCCM.  PMT has been consulted to assist with discussion of hospice resources.  Clinical Assessment and Goals of Care:  I have reviewed medical records including EPIC notes, labs and imaging, received report from RN, assessed the patient and then met at the bedside with HCPOA/Melody Ochoa to discuss diagnosis prognosis, GOC, EOL wishes, disposition and options.  I introduced Palliative Medicine as specialized medical care for people living with serious illness. It focuses on providing relief from the symptoms and stress of a serious illness. The goal is to improve quality of life for both the patient and the family.  We discussed a brief life review of the patient and then focused on their current illness.  The natural disease trajectory and expectations at EOL were discussed.  I attempted to elicit values and goals of care important to the patient.    Medical History Review and Understanding:  Patient and HCPOA understand the severity of her illness and lack of options for improvement or reversal.  Social History: Patient resides at [facility] as well as her husband of 41 years (in memory care there).  She adores her dog Melody Ochoa and has tremendous support from her large family.  Functional and Nutritional State: Patient has a poor appetite and does not find any foods to be enjoyable.  She is eating half a sandwich, some pudding.  Palliative  Symptoms: Dyspnea  Advance Directives: A detailed discussion regarding advanced directives was had.  Rod Holler is her HCPOA.  Discussion: Patient is at peace with her decision to transition to comfort focused path and states that her priority is to be comfortable/avoid suffering.  Her symptoms are worse at night and we discussed risks and benefits of starting a drip versus continuing with as needed medications.  She would feel at ease if she were to continually receive low-dose morphine for her dyspnea.  We discussed options for disposition including residential hospice, hospice at ALF, either at current facility or another facility that could be arranged.  She would like to be near her husband if possible and is open to returning to [facility] as long as she can continue to receive support and timely administration of comfort medications.  The differences between the resources through hospice at each location was discussed in detail.  We discussed the uncertain prognosis and that things may change with ongoing management with comfort medicines while titrating oxygen.   Discussed the importance of continued conversation with family and the medical providers regarding overall plan of care and treatment options, ensuring decisions are within the context of the patient's values and GOCs.   Questions and concerns were addressed.  The family was encouraged to call with questions or concerns.  PMT will continue to support holistically.   SUMMARY OF RECOMMENDATIONS   -DNR -Agree with transition to comfort care today, medications per [facility] -Will order morphine ggt and titrate for comfort, pt reports symptoms are worse at night -Psychosocial and emotional support provided -Spiritual care by chaplain Sallyanne Kuster greatly appreciated -Ongoing discussions regarding disposition and discharge with hospice -PMT  will continue to follow   Prognosis:  Poor prognosis with end-stage pulmonary fibrosis and  certainly hospice appropriate  Discharge Planning: To Be Determined      Primary Diagnoses: Present on Admission:  Multifocal pneumonia   Physical Exam Vitals and nursing note reviewed.  Constitutional:      General: She is not in acute distress.    Appearance: She is ill-appearing.  Cardiovascular:     Rate and Rhythm: Normal rate.  Pulmonary:     Effort: Pulmonary effort is normal.  Skin:    General: Skin is warm.  Neurological:     Mental Status: She is alert and oriented to person, place, and time.  Psychiatric:        Mood and Affect: Mood normal.    Vital Signs: BP (!) 147/80 (BP Location: Right Arm)   Pulse 87   Temp 97.7 F (36.5 C) (Oral)   Resp 18   Ht 5' 4" (1.626 m)   Wt 71.5 kg   SpO2 91%   BMI 27.06 kg/m  Pain Scale: 0-10   Pain Score: 0-No pain   SpO2: SpO2: 91 % O2 Device:SpO2: 91 % O2 Flow Rate: .O2 Flow Rate (L/min): 5 L/min   Palliative Assessment/Data:      MDM: High    Johnnette Litter, PA-C  Palliative Medicine Team Team phone # [phone]  Thank you for allowing the Palliative Medicine Team to assist in the care of this patient. Please utilize secure chat with additional questions, if there is no response within 30 minutes please call the above phone number.  Palliative Medicine Team providers are available by phone from 7am to 7pm daily and can be reached through the team cell phone.  Should this patient require assistance outside of these hours, please call the patient's attending physician.

## 2022-05-04 DIAGNOSIS — J9601 Acute respiratory failure with hypoxia: Secondary | ICD-10-CM | POA: Diagnosis not present

## 2022-05-04 DIAGNOSIS — J189 Pneumonia, unspecified organism: Secondary | ICD-10-CM | POA: Diagnosis not present

## 2022-05-04 DIAGNOSIS — J8410 Pulmonary fibrosis, unspecified: Secondary | ICD-10-CM

## 2022-05-04 DIAGNOSIS — Z515 Encounter for palliative care: Secondary | ICD-10-CM | POA: Diagnosis not present

## 2022-05-04 MED ORDER — WHITE PETROLATUM EX OINT
TOPICAL_OINTMENT | CUTANEOUS | Status: DC | PRN
  Filled 2022-05-04: qty 28.35

## 2022-05-04 NOTE — Progress Notes (Signed)
PROGRESS NOTE    LYAN HOLCK  HWE:[id number] DOB: 1941/12/04 DOA: 04/20/2022 PCP: Merryl Hacker, No    Brief Narrative:   Melody Ochoa is a 81 y.o. female with past medical history significant for end-stage COPD/pulmonary fibrosis on 2-3 L home O2 with home hospice, HLD who presented to [facility] ED on 5/29 via EMS from [facility] with worsening shortness of breath.  Patient reports increased shortness of breath over the last few weeks worse with exertion.  So endorses fatigue.  Apparently her O2 came off during the night in which this has been a recent problem.  She was recently treated with antibiotics and steroids but has continued to feel more shortness of breath.  Also with productive cough intermittently.  Denies fever, but multiple sick contacts at her living facility.  In the ED, temperature 97.8 F, HR 100, RR 25, BP 142/72, SPO2 92% on 4 L nasal cannula.  WBC 9.2, hemoglobin 11.2, platelet count 166.  Sodium 141, potassium 3.5, chloride 106, CO2 28, glucose 144, BUN 10, creatinine 1.03.  BNP 168.1.  High sensitive troponin 68>100.  Chest x-ray with increasing nodular masslike opacities right mid to lower lung concerning for progressive malignancy or acute infection with prominence of right paratracheal soft tissues which could reflect malignant lymphadenopathy.  CT angiogram chest with multiple opacities in the right lung, largest in the upper lobe measuring at least 3.2 x 4.5 cm concerning for multifocal pneumonia in the background of chronic advanced fibrocystic changes, small right pleural effusion, no evidence of pulmonary embolism.  Patient was started on antibiotics with azithromycin and ceftriaxone.  Pulmonary critical care medicine was consulted and recommends transition to comfort measures given her advanced lung disease.  Palliative care was consulted and patient was transition to comfort measures with initiation of morphine drip for shortness of breath and comfort.  Hospital service  consulted for admission for end-of-life care.  Assessment & Plan:   End-stage COPD/pulmonary fibrosis Patient presenting from her living facility with progressive shortness of breath.  On EMS arrival patient was found to be off of her oxygen with SPO2's in the 50s, 1 replaced only increased to the low 80s.  Recently treated with antibiotics and steroids.  Seen by pulmonary critical care medicine with recommendations of transitioning to comfort measures.  Palliative care was consulted and patient was admitted for end-of-life care and supportive treatment with morphine drip under comfort measures. --Palliative care following, appreciate assistance --Morphine drip, titrate to maintain adequate pain/dyspnea control --NS at 75 mL/h --Robinul as needed excessive secretions --Mucinex 600 mg p.o. twice daily --Ativan IV q4h PRN Anxiety --Haldol as needed agitation --TOC for residential placement  HLD: Discontinue statin add on comfort measures  Depression: Discontinue bupropion now on comfort measures  Cognitive impairment: Discontinue donepezil due to comfort measures   DVT prophylaxis:   No DVT prophylaxis indicated on comfort measures   Code Status: DNR Family Communication: No family present at bedside this morning  Disposition Plan:  Level of care: Palliative Care Status is: Inpatient Remains inpatient appropriate because: Awaiting residential hospice placement, on morphine drip    Consultants:  PCCM Palliative care  Procedures:  None  Antimicrobials:  Azithromycin Ceftriaxone   Subjective: Patient seen and examined at bedside, resting comfortably.  Palliative care RN present.  Pleasantly confused.  On morphine drip.  Currently on 5 L nasal cannula which is slightly higher than her typical baseline.  Awaiting residential hospice placement.  Objective: Vitals:   04/24/2022 1414 04/25/2022 1703 05/04/22  0534 05/04/22 0821  BP: (!) 147/80 113/71 126/73 122/64  Pulse: 87 88 83  88  Resp: '18 16 12 17  '$ Temp: 97.7 F (36.5 C) 98.5 F (36.9 C) (!) 97.5 F (36.4 C) 98.1 F (36.7 C)  TempSrc: Oral Oral Oral Oral  SpO2: 91% 93% 93% (!) 78%  Weight: 71.5 kg     Height: '5\' 4"'$  (1.626 m)       Intake/Output Summary (Last 24 hours) at 05/04/2022 1317 Last data filed at 05/04/2022 3329 Gross per 24 hour  Intake 1120.57 ml  Output 150 ml  Net 970.57 ml   Filed Weights   05/02/2022 1414  Weight: 71.5 kg    Examination:  Physical Exam: GEN: NAD, alert, pleasantly confused, not oriented to place [facility]), Time (19...), Or person (President: Cannot say his name) HEENT: NCAT, PERRL, EOMI, sclera clear, MMM PULM: Bilateral crackles with late expiratory wheezing, normal respiratory effort without accessory muscle use, on 5 L nasal cannula (baseline 2-3) CV: RRR w/o M/G/R GI: abd soft, NTND, NABS, no R/G/M MSK: no peripheral edema, moves all extremities independently NEURO: no focal deficits, sensation to light touch intact PSYCH: normal mood/affect Integumentary: dry/intact, no rashes or wounds    Data Reviewed: I have personally reviewed following labs and imaging studies  CBC: Recent Labs  Lab 04/27/2022 0800  WBC 9.2  NEUTROABS 7.9*  HGB 11.2*  HCT 36.7  MCV 87.8  PLT 518   Basic Metabolic Panel: Recent Labs  Lab 04/08/2022 0800  NA 141  K 3.5  CL 106  CO2 28  GLUCOSE 144*  BUN 10  CREATININE 1.03*  CALCIUM 8.6*   GFR: Estimated Creatinine Clearance: 41.5 mL/min (A) (by C-G formula based on SCr of 1.03 mg/dL (H)). Liver Function Tests: No results for input(s): AST, ALT, ALKPHOS, BILITOT, PROT, ALBUMIN in the last 168 hours. No results for input(s): LIPASE, AMYLASE in the last 168 hours. No results for input(s): AMMONIA in the last 168 hours. Coagulation Profile: No results for input(s): INR, PROTIME in the last 168 hours. Cardiac Enzymes: No results for input(s): CKTOTAL, CKMB, CKMBINDEX, TROPONINI in the last 168 hours. BNP (last 3  results) No results for input(s): PROBNP in the last 8760 hours. HbA1C: No results for input(s): HGBA1C in the last 72 hours. CBG: No results for input(s): GLUCAP in the last 168 hours. Lipid Profile: No results for input(s): CHOL, HDL, LDLCALC, TRIG, CHOLHDL, LDLDIRECT in the last 72 hours. Thyroid Function Tests: No results for input(s): TSH, T4TOTAL, FREET4, T3FREE, THYROIDAB in the last 72 hours. Anemia Panel: No results for input(s): VITAMINB12, FOLATE, FERRITIN, TIBC, IRON, RETICCTPCT in the last 72 hours. Sepsis Labs: No results for input(s): PROCALCITON, LATICACIDVEN in the last 168 hours.  Recent Results (from the past 240 hour(s))  SARS Coronavirus 2 by RT PCR (hospital order, performed in [facility] hospital lab) *cepheid single result test* Anterior Nasal Swab     Status: None   Collection Time: 04/20/2022 10:49 AM   Specimen: Anterior Nasal Swab  Result Value Ref Range Status   SARS Coronavirus 2 by RT PCR NEGATIVE NEGATIVE Final    Comment: (NOTE) SARS-CoV-2 target nucleic acids are NOT DETECTED.  The SARS-CoV-2 RNA is generally detectable in upper and lower respiratory specimens during the acute phase of infection. The lowest concentration of SARS-CoV-2 viral copies this assay can detect is 250 copies / mL. A negative result does not preclude SARS-CoV-2 infection and should not be used as the sole basis for  treatment or other patient management decisions.  A negative result may occur with improper specimen collection / handling, submission of specimen other than nasopharyngeal swab, presence of viral mutation(s) within the areas targeted by this assay, and inadequate number of viral copies (<250 copies / mL). A negative result must be combined with clinical observations, patient history, and epidemiological information.  Fact Sheet for Patients:   https://www.patel.info/  Fact Sheet for Healthcare  Providers: https://hall.com/  This test is not yet approved or  cleared by the [place] FDA and has been authorized for detection and/or diagnosis of SARS-CoV-2 by FDA under an Emergency Use Authorization (EUA).  This EUA will remain in effect (meaning this test can be used) for the duration of the COVID-19 declaration under Section 564(b)(1) of the Act, 21 U.S.C. section 360bbb-3(b)(1), unless the authorization is terminated or revoked sooner.  Performed at [facility], [place] [address], [place], [place] [address]          Radiology Studies: CT Angio Chest PE W and/or Wo Contrast  Result Date: 04/24/2022 CLINICAL DATA:  Pulmonary embolism suspected, high/probability. Rule out pneumonia. EXAM: CT ANGIOGRAPHY CHEST WITH CONTRAST TECHNIQUE: Multidetector CT imaging of the chest was performed using the standard protocol during bolus administration of intravenous contrast. Multiplanar CT image reconstructions and MIPs were obtained to evaluate the vascular anatomy. RADIATION DOSE REDUCTION: This exam was performed according to the departmental dose-optimization program which includes automated exposure control, adjustment of the mA and/or kV according to patient size and/or use of iterative reconstruction technique. CONTRAST:  147m OMNIPAQUE IOHEXOL 350 MG/ML SOLN COMPARISON:  CT examination dated October 03, 2021 FINDINGS: Cardiovascular: Satisfactory opacification of the pulmonary arteries to the segmental level. No evidence of pulmonary embolism. Normal heart size. No pericardial effusion. Coronary artery atherosclerotic calcifications. Atherosclerotic calcification of aorta and branch vessels. Mediastinum/Nodes: No enlarged mediastinal, hilar, or axillary lymph nodes. Thyroid gland, trachea, and esophagus demonstrate no significant findings. Lungs/Pleura: Advanced fibrotic changes of bilateral lungs. Interval development of large consolidation in the anterior  aspect of the right upper lobe as well as small other opacities in the right lower lobe. Small right pleural effusion. Upper Abdomen: No acute abnormality.  Pancreatic calcifications. Musculoskeletal: Osteopenia and mild thoracic kyphosis. Degenerative disc disease at multiple levels. Review of the MIP images confirms the above findings. IMPRESSION: 1. Multiple opacities in the right lung, the larger in the upper lobe measuring at least 3.2 x 4.5 cm concerning for multifocal pneumonia in the background of chronic advanced fibrocystic changes. Small right pleural effusion, likely reactive. 2.  No evidence of pulmonary embolism. Aortic Atherosclerosis (ICD10-I70.0) and Emphysema (ICD10-J43.9). Electronically Signed   By: IKeane PoliceD.O.   On: 04/21/2022 09:59   DG Chest Portable 1 View  Result Date: 04/13/2022 CLINICAL DATA:  81year old female with history of shortness of breath. EXAM: PORTABLE CHEST 1 VIEW COMPARISON:  Chest x-ray 04/14/2022. FINDINGS: Lung volumes are very low. Coarse interstitial markings are again noted throughout the lungs bilaterally, similar to prior studies reflective of chronic interstitial lung disease. Increasing nodular and mass-like opacities in the right mid to lower lung. No definite pleural effusions. Chronic elevation of the right hemidiaphragm is unchanged. No pneumothorax. No evidence of pulmonary edema. Heart size is borderline enlarged. Prominence of right paratracheal soft tissues in the upper mediastinum. Atherosclerotic calcifications in the thoracic aorta. Electronic device projecting over the lower left hemithorax, presumably an implantable loop recorder. Orthopedic fixation hardware in the cervical spine and lumbar spine incompletely  imaged. IMPRESSION: 1. Increasing nodular and mass-like opacities in the right mid to lower lung. Whether this reflects progressive malignancy or an acute infection is uncertain. There is also prominence of right paratracheal soft tissues  which could reflect malignant lymphadenopathy. This could be better evaluated with contrast-enhanced chest CT if clinically appropriate. 2. Chronic changes of interstitial lung disease redemonstrated. 3. Chronic elevation of the right hemidiaphragm. 4. Aortic atherosclerosis. Electronically Signed   By: Vinnie Langton M.D.   On: 04/23/2022 08:21        Scheduled Meds:  docusate sodium  100 mg Oral BID   Continuous Infusions:  sodium chloride 75 mL/hr at 04/18/2022 1900   morphine 2 mg/hr (05/04/22 0856)     LOS: 1 day    Time spent: 46 minutes spent on chart review, discussion with nursing staff, consultants, updating family and interview/physical exam; more than 50% of that time was spent in counseling and/or coordination of care.    Ilena Dieckman J [place] DO [facility] Available via Epic secure chat 7am-7pm After these hours, please refer to coverage provider listed on [email or web address] 05/04/2022, 1:17 PM

## 2022-05-04 NOTE — Progress Notes (Addendum)
Patient OV:Melody Ochoa      DOB: 1941-04-22      YOM:[id number]      Palliative Medicine Team    Subjective: Bedside symptom check completed. MD [place] and [place] bedside at time of visit. The patient describes Melody Ochoa as her "Ride or die."    Physical exam: Patient resting in bed with eyes open at time of visit. Breathing even and slightly labored with nasal cannula applied, no excessive secretions noted. Breathing slightly tachypneac, patient endorses being short of breath with speaking. Patient without physical or non-verbal signs of pain or discomfort otherwise at this time. Extremities warm to touch with strong palpable radial pulse. Patient unable to answer orientation questions but pleasantly conversational.   Assessment and plan: Patient denies any pain today. This RN administered morphine per order to assist with feeling of shortness of breath (see eMAR). Patient praises the care she has received on the [address] floor. Bedside RN Melody Ochoa without any needs or concerns at this time. Will continue to follow for any changes or advances.    Thank you for allowing the Palliative Medicine Team to assist in the care of this patient.     Damian Leavell, MSN, RN Palliative Medicine Team Team Phone: [phone]  This phone is monitored 7a-7p, please reach out to attending physician outside of these hours for urgent needs.

## 2022-05-04 NOTE — Progress Notes (Signed)
Daily Progress Note   Patient Name: Melody Ochoa       Date: 05/04/2022 DOB: Jan 28, 1941  Age: 81 y.o. MRN#: [id number] Attending Physician: [place] Eric J, DO Primary Care Physician: Pcp, No Admit Date: 04/15/2022  Reason for Consultation/Follow-up: Hospice Evaluation and Terminal Care  Subjective: Medical records reviewed. Patient assessed at the bedside. Discussed with RN.  Her friend Melody Ochoa is visiting.  Patient reports feeling comfortable, although hungry.  Inquired as to whether Melody Ochoa has had a chance to discuss next steps and her preferred location to discharge with hospice services.  She tells me they have not had a chance yet. Melody Ochoa tells me today is her date of visit so that Melody Ochoa can catch up on missed work.  Melody Ochoa is still feeling she would like to be near her husband if possible at [facility].  Discussed symptom management including generalized itching after starting morphine and relief with Benadryl, itching in her nose likely from oxygen, and increased fatigue since starting the drip.  We discussed increased likelihood of side effects during the first couple days of starting this medication.  Discussed comfort feeding and moisture around the nose, as well as my hope that she will be able to wean further from the oxygen as morphine drip is titrated.  I then called patient's Melody Ochoa and she confirms they have not had much conversation about hospice, she is still hoping for more information about [facility] ability to support the patient.  She is also in the process of calling patient's family to update them on transition to comfort care.  We discussed that arrangement of IV morphine at the facility may be complicated and that residential hospice may be more  appropriate.  She feels patient would continue to benefit from morphine drip overnight.  We discussed that social work often works with facilities to discuss possibility of return with hospice if that is the patient preference.  Melody Ochoa is hoping to arrive tomorrow morning early to speak with a physician and is agreeable to meeting with PMT provider at that time as well.  Questions and concerns addressed. PMT will continue to support holistically.   Length of Stay: 1   Physical Exam Vitals and nursing note reviewed.  Constitutional:      General: She is not in acute distress.  Appearance: She is ill-appearing.     Interventions: Nasal cannula in place.  Cardiovascular:     Rate and Rhythm: Normal rate.  Pulmonary:     Effort: Pulmonary effort is normal.  Skin:    General: Skin is warm and dry.  Neurological:     Mental Status: She is alert and easily aroused.  Psychiatric:        Mood and Affect: Mood normal.            Vital Signs: BP 122/64 (BP Location: Left Arm)   Pulse 88   Temp 98.1 F (36.7 C) (Oral)   Resp 17   Ht '5\' 4"'$  (1.626 m)   Wt 71.5 kg   SpO2 (!) 78%   BMI 27.06 kg/m  SpO2: SpO2: (!) 78 % O2 Device: O2 Device: Nasal Cannula O2 Flow Rate: O2 Flow Rate (L/min): 5 L/min      Palliative Assessment/Data: 30%    Palliative Care Assessment & Plan   Patient Profile:  81 y.o. female  with past medical history of advanced pulmonary fibrosis admitted on 04/30/2022 with worsening shortness of breath.    Patient has transitioned to comfort care after discussion with PCCM.  PMT has been consulted to assist with discussion of hospice resources.  Assessment: End of life care  Recommendations/Plan: Continue comfort care Ordered comfort cart for patient and family Ordered vaseline for patient dry perinasal skin/irritation Patient and family continue to deliberate on hospice choices, would like to discuss further when HCPOA is able to visit in person tomorrow PMT  will continue to follow and support   Prognosis:  < 6 weeks  Discharge Planning: To Be Determined  Care plan was discussed with patient, patient's friend, patient's HCPOA   Total time: I spent 40 minutes in the care of the patient today in the above activities and documenting the encounter.         Keyston Ardolino Johnnette Litter, PA-C  Palliative Medicine Team Team phone # [phone]  Thank you for allowing the Palliative Medicine Team to assist in the care of this patient. Please utilize secure chat with additional questions, if there is no response within 30 minutes please call the above phone number.  Palliative Medicine Team providers are available by phone from 7am to 7pm daily and can be reached through the team cell phone.  Should this patient require assistance outside of these hours, please call the patient's attending physician.

## 2022-05-04 NOTE — Progress Notes (Signed)
This chaplain is present for F/U spiritual care. The Pt. responded to her name and returned to sleeping. The Pt. friend-Melody Ochoa is at the bedside. Melody Ochoa shares the Pt. is resting comfortably this morning. The chaplain plans to revisit per the Pt. request.  Melody Ochoa [phone]

## 2022-05-05 DIAGNOSIS — Z515 Encounter for palliative care: Secondary | ICD-10-CM | POA: Diagnosis not present

## 2022-05-05 MED ORDER — LORAZEPAM 2 MG/ML IJ SOLN
1.0000 mg | INTRAMUSCULAR | Status: DC
  Administered 2022-05-05: 1 mg via INTRAVENOUS
  Filled 2022-05-05 (×2): qty 1

## 2022-05-05 MED ORDER — GLYCOPYRROLATE 0.2 MG/ML IJ SOLN
0.4000 mg | INTRAMUSCULAR | Status: DC
  Administered 2022-05-05 (×2): 0.4 mg via INTRAVENOUS
  Filled 2022-05-05 (×2): qty 2

## 2022-05-05 MED ORDER — KETOROLAC TROMETHAMINE 15 MG/ML IJ SOLN
15.0000 mg | Freq: Three times a day (TID) | INTRAMUSCULAR | Status: DC
Start: 2022-05-05 — End: 2022-05-05
  Administered 2022-05-05: 15 mg via INTRAVENOUS
  Filled 2022-05-05: qty 1

## 2022-05-06 NOTE — Assessment & Plan Note (Addendum)
She's been transitioned to comfort measures only Discussed with Rod Holler and [name] at bedside today, plan for her to stay in house for comfort measures Prognosis hours to days Appreciate assistance of palliative care

## 2022-05-06 NOTE — Progress Notes (Signed)
PROGRESS NOTE    Melody Ochoa  ZOX:[id number] DOB: 1941-04-25 DOA: 04/18/2022 PCP: Pcp, No  Chief Complaint  Patient presents with   Shortness of Breath    Brief Narrative:  Melody Ochoa is Melody Ochoa 81 y.o. female with past medical history significant for end-stage COPD/pulmonary fibrosis on 2-3 L home O2 with home hospice, HLD who presented to [facility] ED on 5/29 via EMS from [facility] with worsening shortness of breath.  Patient reports increased shortness of breath over the last few weeks worse with exertion.  So endorses fatigue.  Apparently her O2 came off during the night in which this has been Melody Ochoa recent problem.  She was recently treated with antibiotics and steroids but has continued to feel more shortness of breath.  Also with productive cough intermittently.  Denies fever, but multiple sick contacts at her living facility.   In the ED, temperature 97.8 F, HR 100, RR 25, BP 142/72, SPO2 92% on 4 L nasal cannula.  WBC 9.2, hemoglobin 11.2, platelet count 166.  Sodium 141, potassium 3.5, chloride 106, CO2 28, glucose 144, BUN 10, creatinine 1.03.  BNP 168.1.  High sensitive troponin 68>100.  Chest x-ray with increasing nodular masslike opacities right mid to lower lung concerning for progressive malignancy or acute infection with prominence of right paratracheal soft tissues which could reflect malignant lymphadenopathy.  CT angiogram chest with multiple opacities in the right lung, largest in the upper lobe measuring at least 3.2 x 4.5 cm concerning for multifocal pneumonia in the background of chronic advanced fibrocystic changes, small right pleural effusion, no evidence of pulmonary embolism.  Patient was started on antibiotics with azithromycin and ceftriaxone.  Pulmonary critical care medicine was consulted and recommends transition to comfort measures given her advanced lung disease.  Palliative care was consulted and patient was transition to comfort measures with initiation of  morphine drip for shortness of breath and comfort.  Hospital service consulted for admission for end-of-life care.    Assessment & Plan:   Principal Problem:   Comfort measures only status Active Problems:   Pulmonary fibrosis (HCC)   Acute on chronic respiratory failure with hypoxia (HCC)   COPD with acute exacerbation (HCC)   Dyslipidemia   Cognitive impairment   Multifocal pneumonia   Assessment and Plan: * Comfort measures only status She's been transitioned to comfort measures only Discussed with Melody Ochoa and Melody Ochoa at bedside today, plan for her to stay in house for comfort measures Prognosis hours to days Appreciate assistance of palliative care  Acute on chronic respiratory failure with hypoxia ([place]) Appreciate pulmonary assistance, due to end stage pulmonary fibrosis As above, comfort measures  Multifocal pneumonia CT at presentation with multiple opacities in R lung, concerning for multifocal pneumonia As above, at this point, planning comfort measures          DVT prophylaxis: comfort Code Status: DNR Family Communication: Melody Ochoa Disposition:   Status is: Inpatient Remains inpatient appropriate because: anticipate in hospital death   Consultants:  Pulm palliative  Procedures:  none  Antimicrobials:  Anti-infectives (From admission, onward)    Start     Dose/Rate Route Frequency Ordered Stop   05/04/22 0900  azithromycin (ZITHROMAX) 500 mg in sodium chloride 0.9 % 250 mL IVPB  Status:  Discontinued        500 mg 250 mL/hr over 60 Minutes Intravenous Every 24 hours 04/07/2022 1250 04/30/2022 1351   05/04/22 0900  cefTRIAXone (ROCEPHIN) 1 g in sodium chloride 0.9 % 100  mL IVPB  Status:  Discontinued        1 g 200 mL/hr over 30 Minutes Intravenous Every 24 hours 04/09/2022 1250 04/09/2022 1351   04/29/2022 1030  cefTRIAXone (ROCEPHIN) 1 g in sodium chloride 0.9 % 100 mL IVPB        1 g 200 mL/hr over 30 Minutes Intravenous  Once 04/10/2022 1023 04/26/2022  1126   04/17/2022 1030  azithromycin (ZITHROMAX) 500 mg in sodium chloride 0.9 % 250 mL IVPB        500 mg 250 mL/hr over 60 Minutes Intravenous  Once 04/20/2022 1023 04/20/2022 1245       Subjective: Confused Melody Ochoa and Melody Ochoa at bedside  Objective: Vitals:   05/04/22 2042 2022-05-22 0641 May 22, 2022 0830 05/22/2022 1422  BP:  110/61 (!) 113/54   Pulse:  100 100   Resp:  16 19   Temp:  99.9 F (37.7 C) 99.8 F (37.7 C)   TempSrc:  Oral Oral   SpO2: (!) 80% (!) 87% 91%   Weight:    71.5 kg  Height:    '5\' 4"'$  (1.626 m)    Intake/Output Summary (Last 24 hours) at 05/22/22 1528 Last data filed at 05/04/2022 1900 Gross per 24 hour  Intake --  Output 250 ml  Net -250 ml   Filed Weights   05/03/2022 1414 2022-05-22 1422  Weight: 71.5 kg 71.5 kg    Examination:  General exam: appears to be short of breath Respiratory system: increased wob, audible respirations Gastrointestinal system: some belly breathing noted Central nervous system: confused, lethargic    Data Reviewed: I have personally reviewed following labs and imaging studies  CBC: Recent Labs  Lab 04/23/2022 0800  WBC 9.2  NEUTROABS 7.9*  HGB 11.2*  HCT 36.7  MCV 87.8  PLT 237    Basic Metabolic Panel: Recent Labs  Lab 04/12/2022 0800  NA 141  K 3.5  CL 106  CO2 28  GLUCOSE 144*  BUN 10  CREATININE 1.03*  CALCIUM 8.6*    GFR: Estimated Creatinine Clearance: 41.5 mL/min (Melody Ochoa) (by C-G formula based on SCr of 1.03 mg/dL (H)).  Liver Function Tests: No results for input(s): AST, ALT, ALKPHOS, BILITOT, PROT, ALBUMIN in the last 168 hours.  CBG: No results for input(s): GLUCAP in the last 168 hours.   Recent Results (from the past 240 hour(s))  SARS Coronavirus 2 by RT PCR (hospital order, performed in [facility] hospital lab) *cepheid single result test* Anterior Nasal Swab     Status: None   Collection Time: 04/23/2022 10:49 AM   Specimen: Anterior Nasal Swab  Result Value Ref Range Status   SARS  Coronavirus 2 by RT PCR NEGATIVE NEGATIVE Final    Comment: (NOTE) SARS-CoV-2 target nucleic acids are NOT DETECTED.  The SARS-CoV-2 RNA is generally detectable in upper and lower respiratory specimens during the acute phase of infection. The lowest concentration of SARS-CoV-2 viral copies this assay can detect is 250 copies / mL. Christifer Chapdelaine negative result does not preclude SARS-CoV-2 infection and should not be used as the sole basis for treatment or other patient management decisions.  Lya Holben negative result may occur with improper specimen collection / handling, submission of specimen other than nasopharyngeal swab, presence of viral mutation(s) within the areas targeted by this assay, and inadequate number of viral copies (<250 copies / mL). Preeya Cleckley negative result must be combined with clinical observations, patient history, and epidemiological information.  Fact Sheet for Patients:   https://www.patel.info/  Fact  Sheet for Healthcare Providers: https://hall.com/  This test is not yet approved or  cleared by the [place] and has been authorized for detection and/or diagnosis of SARS-CoV-2 by FDA under an Emergency Use Authorization (EUA).  This EUA will remain in effect (meaning this test can be used) for the duration of the COVID-19 declaration under Section 564(b)(1) of the Act, 21 U.S.C. section 360bbb-3(b)(1), unless the authorization is terminated or revoked sooner.  Performed at [facility], [place] [address], [place], [place] [address]          Radiology Studies: No results found.      Scheduled Meds:  docusate sodium  100 mg Oral BID   glycopyrrolate  0.4 mg Intravenous Q4H   ketorolac  15 mg Intravenous Q8H   LORazepam  1 mg Intravenous Q4H   Continuous Infusions:  sodium chloride 75 mL/hr at 05/04/22 2321   morphine 3 mg/hr (05/31/22 0545)     LOS: 2 days    Time spent: over 30 min    Fayrene Helper,  MD [facility]   To contact the attending provider between 7A-7P or the covering provider during after hours 7P-7A, please log into the web site [email or web address] and access using universal [place] password for that web site. If you do not have the password, please call the hospital operator.  05-31-2022, 3:28 PM

## 2022-05-06 NOTE — Assessment & Plan Note (Signed)
CT at presentation with multiple opacities in R lung, concerning for multifocal pneumonia As above, at this point, planning comfort measures

## 2022-05-06 NOTE — Hospital Course (Addendum)
Melody Ochoa is [name] 81 y.o. female with past medical history significant for end-stage COPD/pulmonary fibrosis on 2-3 L home O2 with home hospice, HLD who presented to [facility] ED on 5/29 via EMS from [facility] with worsening shortness of breath.  Patient reports increased shortness of breath over the last few weeks worse with exertion.  So endorses fatigue.  Apparently her O2 came off during the night in which this has been [name] recent problem.  She was recently treated with antibiotics and steroids but has continued to feel more shortness of breath.  Also with productive cough intermittently.  Denies fever, but multiple sick contacts at her living facility.   In the ED, temperature 97.8 F, HR 100, RR 25, BP 142/72, SPO2 92% on 4 L nasal cannula.  WBC 9.2, hemoglobin 11.2, platelet count 166.  Sodium 141, potassium 3.5, chloride 106, CO2 28, glucose 144, BUN 10, creatinine 1.03.  BNP 168.1.  High sensitive troponin 68>100.  Chest x-ray with increasing nodular masslike opacities right mid to lower lung concerning for progressive malignancy or acute infection with prominence of right paratracheal soft tissues which could reflect malignant lymphadenopathy.  CT angiogram chest with multiple opacities in the right lung, largest in the upper lobe measuring at least 3.2 x 4.5 cm concerning for multifocal pneumonia in the background of chronic advanced fibrocystic changes, small right pleural effusion, no evidence of pulmonary embolism.  Patient was started on antibiotics with azithromycin and ceftriaxone.  Pulmonary critical care medicine was consulted and recommends transition to comfort measures given her advanced lung disease.  Palliative care was consulted and patient was transition to comfort measures with initiation of morphine drip for shortness of breath and comfort.  she passed away on 05-08-23 with family at bedside.  See below for additional details

## 2022-05-06 NOTE — Assessment & Plan Note (Signed)
Appreciate pulmonary assistance, due to end stage pulmonary fibrosis As above, comfort measures

## 2022-05-06 NOTE — Progress Notes (Addendum)
Palliative Medicine Inpatient Follow Up Note   HPI: 81 y.o. female  with past medical history of advanced pulmonary fibrosis admitted on 04/14/2022 with worsening shortness of breath. Patient has transitioned to comfort care after discussion with PCCM.  PMT has been consulted to assist with discussion of hospice resources.  Today's Discussion 05-28-2022  *Please note that this is a verbal dictation therefore any spelling or grammatical errors are due to the "[place] [facility]" system interpretation.  Chart reviewed inclusive of vital signs, progress notes, laboratory results, and diagnostic images.   I met with Melody Ochoa at bedside.  Dr. Florene Glen was present sharing that he has noticed a significant change in Melody Ochoa from yesterday to today.  We reviewed that no longer will we consider placement options as it is suspected Melody Ochoa will pass in the hospital.  Melody Ochoa appears to be restless, moaning, clenching her fists.  I shared with patient's family that it does not appear her symptoms are well controlled at this time.  I provided morphine boluses x2 and increase the morphine rate to 5 mg an hour.  I wrote her for Ativan around-the-clock and added Toradol around-the-clock for the next 24 hours in the setting of fevers.  Patient's friends asks if it is possible for Melody Ochoa to have her grandson and dog visit.  We reviewed the importance of these 2 factors in her life.  I was able to get clearance from the nursing staff to have Melody Ochoa's dog, Melody Ochoa - who is a Marine scientist mix come into the hospital.   A discussion of Melody Ochoa's life was completed and the type of woman she was.  Her family endorsed that her biggest goal was to not feel like she could not breathe.  Education provided on the use of morphine to aid in end-of-life dyspnea.  Created space and opportunity for patient to explore thoughts feelings and fears regarding current medical situation.  Questions and concerns addressed    Palliative Support Provided __________________________________________ Addendum:  Per hospice liaison, Melody Ochoa she was having increased work up breathing.  I went by Melody Ochoa's room this afternoon to evaluate her symptoms. Melody Ochoa was tachypneic and using accessory muscles with breaths.  I spoke to patients family and reviewed providing another bolus and increase gtt to $Remo'6mg'UfKWb$ /hr. Family was in agreement. I waited at bedside for patients symptoms to improve. I left the room around 1300.   Palliative support provided/Education provided on death and dying.  Additional Time: 30 minutes.  Objective Assessment: Vital Signs Vitals:   2022/05/28 0641 May 28, 2022 0830  BP: 110/61 (!) 113/54  Pulse: 100 100  Resp: 16 19  Temp: 99.9 F (37.7 C) 99.8 F (37.7 C)  SpO2: (!) 87% 91%    Intake/Output Summary (Last 24 hours) at 2022-05-28 1147 Last data filed at 05/04/2022 1900 Gross per 24 hour  Intake --  Output 250 ml  Net -250 ml   Last Weight  Most recent update: 04/08/2022  2:27 PM    Weight  71.5 kg (157 lb 10.1 oz)            Gen: Elderly Caucasian female in moderate distress HEENT: Dry mucous membranes CV: Irregular rate and rhythm PULM: Titrated to 2 L/min nasal cannula ABD: soft/nontender EXT: No edema Neuro: Disoriented  SUMMARY OF RECOMMENDATIONS   Continue comfort care Added Ativan 1 mg every 4 hours around-the-clock Added Toradol 15 mg every 8 hours for the next 24 hours Increase morphine drip to 5 mg an hour Ordered  comfort cart for patient and family Ordered vaseline for patient dry perinasal skin/irritation Anticipate in-hospital death Prognosis Limited hours to days PMT will continue to follow and support symptom management  Billing based on MDM: High  Problems Addressed: One acute or chronic illness or injury that poses a threat to life or bodily function  Amount and/or Complexity of Data: Category 3:Discussion of management or test interpretation  with external physician/other qualified health care professional/appropriate source (not separately reported)  Risks: Decision not to resuscitate or to de-escalate care because of poor prognosis ______________________________________________________________________________________ [place] Team Team Cell Phone: [phone] Please utilize secure chat with additional questions, if there is no response within 30 minutes please call the above phone number  Palliative Medicine Team providers are available by phone from 7am to 7pm daily and can be reached through the team cell phone.  Should this patient require assistance outside of these hours, please call the patient's attending physician.

## 2022-05-06 NOTE — Progress Notes (Signed)
Nila Winker,RN and Bernie Covey called the time of death on patient at 34. MD notified and aware family at bedside.

## 2022-05-06 NOTE — Progress Notes (Signed)
[place] patient.  Melody Ochoa is a current patient with our hospice services with a CTI of Pulmonary fibrosis. She was found unresponsive by her facility [facility]. Family was notified as well as [facility]. EMS transferred patient where she is being admitted for EOL care.This is a related admission.  Patient is appropriate for GIP in the setting of needing IV abx and IV fluids, transitioning to EOL care.   Visited patient and family at bedside. Patient was moaning, work of breathing, with moments of pauses. She is unresponsive. Family is very supportive at bedside and discussed that she had a few more family members to come see her. PMT is monitoring closely as this is expected to be a hospital death.  VS: 99.8T, 113/54, 100P, 19RR, O2 [place] 2L comfort Labs:  Latest Reference Range & Units 04/15/2022 62:22  BASIC METABOLIC PANEL  Rpt !  Sodium 135 - 145 mmol/L 141  Potassium 3.5 - 5.1 mmol/L 3.5  Chloride 98 - 111 mmol/L 106  CO2 22 - 32 mmol/L 28  Glucose 70 - 99 mg/dL 144 (H)  BUN 8 - 23 mg/dL 10  Creatinine 0.44 - 1.00 mg/dL 1.03 (H)  Calcium 8.9 - 10.3 mg/dL 8.6 (L)  Anion gap 5 - 15  7  GFR, Estimated >60 mL/min 55 (L)  B Natriuretic Peptide 0.0 - 100.0 pg/mL 168.1 (H)  Troponin I (High Sensitivity) <18 ng/L 68 (H)  WBC 4.0 - 10.5 K/uL 9.2  RBC 3.87 - 5.11 MIL/uL 4.18  Hemoglobin 12.0 - 15.0 g/dL 11.2 (L)  HCT 36.0 - 46.0 % 36.7  MCV 80.0 - 100.0 fL 87.8  MCH 26.0 - 34.0 pg 26.8  MCHC 30.0 - 36.0 g/dL 30.5  RDW 11.5 - 15.5 % 15.9 (H)  Platelets 150 - 400 K/uL 166  nRBC 0.0 - 0.2 % 0.0  Neutrophils % 86  Lymphocytes % 5  Monocytes Relative % 5  Eosinophil % 3  Basophil % 1  Immature Granulocytes % 0  NEUT# 1.7 - 7.7 K/uL 7.9 (H)  Lymphocyte # 0.7 - 4.0 K/uL 0.5 (L)  Monocyte # 0.1 - 1.0 K/uL 0.4  Eosinophils Absolute 0.0 - 0.5 K/uL 0.3  Basophils Absolute 0.0 - 0.1 K/uL 0.1  Abs Immature Granulocytes 0.00 - 0.07 K/uL 0.04    Diagnostics: IMPRESSION: 1. Multiple opacities in the right lung, the larger in the upper lobe measuring at least 3.2 x 4.5 cm concerning for multifocal pneumonia in the background of chronic advanced fibrocystic changes. Small right pleural effusion, likely reactive.   2.  No evidence of pulmonary embolism.   Aortic Atherosclerosis (ICD10-I70.0) and Emphysema (ICD10-J43.9).     Electronically Signed   By: Keane Police D.O.   On: 04/16/2022 09:59  Assessment & Plan:   End-stage COPD/pulmonary fibrosis Patient presenting from her living facility with progressive shortness of breath.  On EMS arrival patient was found to be off of her oxygen with SPO2's in the 50s, 1 replaced only increased to the low 80s.  Recently treated with antibiotics and steroids.  Seen by pulmonary critical care medicine with recommendations of transitioning to comfort measures.  Palliative care was consulted and patient was admitted for end-of-life care and supportive treatment with morphine drip under comfort measures. --Palliative care following, appreciate assistance --Morphine drip, titrate to maintain adequate pain/dyspnea control --NS at 75 mL/h --Robinul as needed excessive secretions --Mucinex 600 mg p.o. twice daily --Ativan IV q4h PRN Anxiety --Haldol as needed agitation --TOC  for residential placement   HLD: Discontinue statin add on comfort measures   Depression: Discontinue bupropion now on comfort measures   Cognitive impairment: Discontinue donepezil due to comfort measures    GOC: DNR, Comfort care Discharge: Expected Hospital death Family:updated at bedside [id number]  Thank you, Clementeen Hoof, [place], [place] [phone]

## 2022-05-06 NOTE — Progress Notes (Signed)
Lilas Diefendorf,RN and [place], LPN wasted pt's remaining Morphine drip pt had 67m left in the bag. Witnessed by two RN's.

## 2022-05-06 NOTE — Death Summary Note (Signed)
DEATH SUMMARY   Patient Details  Name: Melody Ochoa MRN: [id number] DOB: [birth year] PCP:Pcp, No Admission/Discharge Information   Admit Date:  May 23, 2022  Date of Death: Date of Death: 2022/05/25  Time of Death: Time of Death: 03-09-1421  Length of Stay: 2   Principle Cause of death: end stage pulmonary fibrosis, community acquired pneumonia  Hospital Diagnoses: Principal Problem:   Comfort measures only status Active Problems:   Pulmonary fibrosis ([place])   Acute on chronic respiratory failure with hypoxia ([place])   COPD with acute exacerbation ([place])   Dyslipidemia   Cognitive impairment   Multifocal pneumonia   Hospital Course: Melody Ochoa is Melody Ochoa 81 y.o. female with past medical history significant for end-stage COPD/pulmonary fibrosis on 2-3 L home O2 with home hospice, HLD who presented to [facility] ED on 05/24/23 via EMS from [facility] with worsening shortness of breath.  Patient reports increased shortness of breath over the last few weeks worse with exertion.  So endorses fatigue.  Apparently her O2 came off during the night in which this has been Melody Ochoa recent problem.  She was recently treated with antibiotics and steroids but has continued to feel more shortness of breath.  Also with productive cough intermittently.  Denies fever, but multiple sick contacts at her living facility.   In the ED, temperature 97.8 F, HR 100, RR 25, BP 142/72, SPO2 92% on 4 L nasal cannula.  WBC 9.2, hemoglobin 11.2, platelet count 166.  Sodium 141, potassium 3.5, chloride 106, CO2 28, glucose 144, BUN 10, creatinine 1.03.  BNP 168.1.  High sensitive troponin 68>100.  Chest x-ray with increasing nodular masslike opacities right mid to lower lung concerning for progressive malignancy or acute infection with prominence of right paratracheal soft tissues which could reflect malignant lymphadenopathy.  CT angiogram chest with multiple opacities in the right lung, largest in the upper lobe measuring at least 3.2  x 4.5 cm concerning for multifocal pneumonia in the background of chronic advanced fibrocystic changes, small right pleural effusion, no evidence of pulmonary embolism.  Patient was started on antibiotics with azithromycin and ceftriaxone.  Pulmonary critical care medicine was consulted and recommends transition to comfort measures given her advanced lung disease.  Palliative care was consulted and patient was transition to comfort measures with initiation of morphine drip for shortness of breath and comfort.  she passed away on May 26, 2023 with family at bedside.  See below for additional details  Assessment and Plan: * Comfort measures only status She's been transitioned to comfort measures only Discussed with Melody Ochoa and Melody Ochoa at bedside today, plan for her to stay in house for comfort measures Prognosis hours to days Appreciate assistance of palliative care  Acute on chronic respiratory failure with hypoxia ([place]) Appreciate pulmonary assistance, due to end stage pulmonary fibrosis As above, comfort measures  Multifocal pneumonia CT at presentation with multiple opacities in R lung, concerning for multifocal pneumonia As above, at this point, planning comfort measures       Procedures: none  Consultations: pulm, palliative  The results of significant diagnostics from this hospitalization (including imaging, microbiology, ancillary and laboratory) are listed below for reference.   Significant Diagnostic Studies: DG Chest 2 View  Result Date: 04/14/2022 CLINICAL DATA:  Shortness of breath and hypoxia. EXAM: CHEST - 2 VIEW COMPARISON:  Chest x-ray dated November 17, 2021. FINDINGS: Stable cardiomediastinal silhouette. Unchanged loop recorder. Persistent low lung volumes with similar chronic interstitial lung disease/fibrosis. No focal consolidation, pleural effusion, or pneumothorax. 1.8  cm nodular density at the peripheral right lung base, not clearly seen on prior chest x-ray, but  potentially increased in size compared to Melody Ochoa chest CT from last October. Unchanged elevation of the right hemidiaphragm. No acute osseous abnormality. Prior lumbar fusion. IMPRESSION: 1. No acute cardiopulmonary disease. Similar chronic interstitial lung disease. 2. 1.8 cm nodular density at the peripheral right lung base, not clearly seen on prior chest x-ray, but potentially increased in size compared to Melody Ochoa chest CT from last October. Repeat non-contrast chest CT is recommended. Electronically Signed   By: Titus Dubin M.D.   On: 04/14/2022 11:09   CT Angio Chest PE W and/or Wo Contrast  Result Date: 04/17/2022 CLINICAL DATA:  Pulmonary embolism suspected, high/probability. Rule out pneumonia. EXAM: CT ANGIOGRAPHY CHEST WITH CONTRAST TECHNIQUE: Multidetector CT imaging of the chest was performed using the standard protocol during bolus administration of intravenous contrast. Multiplanar CT image reconstructions and MIPs were obtained to evaluate the vascular anatomy. RADIATION DOSE REDUCTION: This exam was performed according to the departmental dose-optimization program which includes automated exposure control, adjustment of the mA and/or kV according to patient size and/or use of iterative reconstruction technique. CONTRAST:  150m OMNIPAQUE IOHEXOL 350 MG/ML SOLN COMPARISON:  CT examination dated October 03, 2021 FINDINGS: Cardiovascular: Satisfactory opacification of the pulmonary arteries to the segmental level. No evidence of pulmonary embolism. Normal heart size. No pericardial effusion. Coronary artery atherosclerotic calcifications. Atherosclerotic calcification of aorta and branch vessels. Mediastinum/Nodes: No enlarged mediastinal, hilar, or axillary lymph nodes. Thyroid gland, trachea, and esophagus demonstrate no significant findings. Lungs/Pleura: Advanced fibrotic changes of bilateral lungs. Interval development of large consolidation in the anterior aspect of the right upper lobe as well as  small other opacities in the right lower lobe. Small right pleural effusion. Upper Abdomen: No acute abnormality.  Pancreatic calcifications. Musculoskeletal: Osteopenia and mild thoracic kyphosis. Degenerative disc disease at multiple levels. Review of the MIP images confirms the above findings. IMPRESSION: 1. Multiple opacities in the right lung, the larger in the upper lobe measuring at least 3.2 x 4.5 cm concerning for multifocal pneumonia in the background of chronic advanced fibrocystic changes. Small right pleural effusion, likely reactive. 2.  No evidence of pulmonary embolism. Aortic Atherosclerosis (ICD10-I70.0) and Emphysema (ICD10-J43.9). Electronically Signed   By: IKeane PoliceD.O.   On: 04/05/2022 09:59   DG Chest Portable 1 View  Result Date: 04/25/2022 CLINICAL DATA:  81year old female with history of shortness of breath. EXAM: PORTABLE CHEST 1 VIEW COMPARISON:  Chest x-ray 04/14/2022. FINDINGS: Lung volumes are very low. Coarse interstitial markings are again noted throughout the lungs bilaterally, similar to prior studies reflective of chronic interstitial lung disease. Increasing nodular and mass-like opacities in the right mid to lower lung. No definite pleural effusions. Chronic elevation of the right hemidiaphragm is unchanged. No pneumothorax. No evidence of pulmonary edema. Heart size is borderline enlarged. Prominence of right paratracheal soft tissues in the upper mediastinum. Atherosclerotic calcifications in the thoracic aorta. Electronic device projecting over the lower left hemithorax, presumably an implantable loop recorder. Orthopedic fixation hardware in the cervical spine and lumbar spine incompletely imaged. IMPRESSION: 1. Increasing nodular and mass-like opacities in the right mid to lower lung. Whether this reflects progressive malignancy or an acute infection is uncertain. There is also prominence of right paratracheal soft tissues which could reflect malignant  lymphadenopathy. This could be better evaluated with contrast-enhanced chest CT if clinically appropriate. 2. Chronic changes of interstitial lung disease redemonstrated. 3. Chronic elevation of  the right hemidiaphragm. 4. Aortic atherosclerosis. Electronically Signed   By: Vinnie Langton M.D.   On: 04/29/2022 08:21    Microbiology: Recent Results (from the past 240 hour(s))  SARS Coronavirus 2 by RT PCR (hospital order, performed in [facility] hospital lab) *cepheid single result test* Anterior Nasal Swab     Status: None   Collection Time: 04/20/2022 10:49 AM   Specimen: Anterior Nasal Swab  Result Value Ref Range Status   SARS Coronavirus 2 by RT PCR NEGATIVE NEGATIVE Final    Comment: (NOTE) SARS-CoV-2 target nucleic acids are NOT DETECTED.  The SARS-CoV-2 RNA is generally detectable in upper and lower respiratory specimens during the acute phase of infection. The lowest concentration of SARS-CoV-2 viral copies this assay can detect is 250 copies / mL. Malikai Gut negative result does not preclude SARS-CoV-2 infection and should not be used as the sole basis for treatment or other patient management decisions.  Delyla Sandeen negative result may occur with improper specimen collection / handling, submission of specimen other than nasopharyngeal swab, presence of viral mutation(s) within the areas targeted by this assay, and inadequate number of viral copies (<250 copies / mL). Emberlyn Burlison negative result must be combined with clinical observations, patient history, and epidemiological information.  Fact Sheet for Patients:   https://www.patel.info/  Fact Sheet for Healthcare Providers: https://hall.com/  This test is not yet approved or  cleared by the [place] FDA and has been authorized for detection and/or diagnosis of SARS-CoV-2 by FDA under an Emergency Use Authorization (EUA).  This EUA will remain in effect (meaning this test can be used) for the duration of  the COVID-19 declaration under Section 564(b)(1) of the Act, 21 U.S.C. section 360bbb-3(b)(1), unless the authorization is terminated or revoked sooner.  Performed at [facility], [place] [address], [place], [place] [address]     Time spent: 15 minutes  Signed: Fayrene Helper, MD 05-19-2022

## 2022-05-06 DEATH — deceased

## 2022-05-19 ENCOUNTER — Inpatient Hospital Stay: Payer: Medicare Other

## 2022-05-20 ENCOUNTER — Ambulatory Visit: Payer: Medicare Other | Admitting: Pulmonary Disease

## 2022-08-04 ENCOUNTER — Other Ambulatory Visit: Payer: Medicare Other

## 2022-08-04 ENCOUNTER — Ambulatory Visit: Payer: Medicare Other | Admitting: Oncology

## 2022-08-26 ENCOUNTER — Ambulatory Visit: Payer: Medicare Other | Admitting: Dermatology

## 2023-02-10 ENCOUNTER — Encounter: Payer: Self-pay | Admitting: Pulmonary Disease
